# Patient Record
Sex: Female | Born: 1950
Health system: Southern US, Community
[De-identification: ages and names within clinical notes are randomized; demographics above are authoritative.]

## PROBLEM LIST (undated history)

## (undated) DIAGNOSIS — I639 Cerebral infarction, unspecified: Secondary | ICD-10-CM

## (undated) DIAGNOSIS — F419 Anxiety disorder, unspecified: Secondary | ICD-10-CM

## (undated) DIAGNOSIS — C50919 Malignant neoplasm of unspecified site of unspecified female breast: Secondary | ICD-10-CM

## (undated) DIAGNOSIS — Z973 Presence of spectacles and contact lenses: Secondary | ICD-10-CM

## (undated) DIAGNOSIS — I10 Essential (primary) hypertension: Secondary | ICD-10-CM

## (undated) DIAGNOSIS — T4145XA Adverse effect of unspecified anesthetic, initial encounter: Secondary | ICD-10-CM

## (undated) DIAGNOSIS — E785 Hyperlipidemia, unspecified: Secondary | ICD-10-CM

## (undated) DIAGNOSIS — M1990 Unspecified osteoarthritis, unspecified site: Secondary | ICD-10-CM

## (undated) DIAGNOSIS — C569 Malignant neoplasm of unspecified ovary: Secondary | ICD-10-CM

## (undated) DIAGNOSIS — Z923 Personal history of irradiation: Secondary | ICD-10-CM

## (undated) DIAGNOSIS — Z9221 Personal history of antineoplastic chemotherapy: Secondary | ICD-10-CM

## (undated) DIAGNOSIS — T8859XA Other complications of anesthesia, initial encounter: Secondary | ICD-10-CM

## (undated) DIAGNOSIS — I4891 Unspecified atrial fibrillation: Secondary | ICD-10-CM

## (undated) HISTORY — DX: Presence of spectacles and contact lenses: Z97.3

## (undated) HISTORY — PX: TUBAL LIGATION: SHX77

## (undated) HISTORY — DX: Hyperlipidemia, unspecified: E78.5

## (undated) HISTORY — DX: Anxiety disorder, unspecified: F41.9

## (undated) HISTORY — DX: Malignant neoplasm of unspecified site of unspecified female breast: C50.919

## (undated) HISTORY — PX: APPENDECTOMY: SHX54

---

## 2002-12-07 HISTORY — PX: COLONOSCOPY: SHX174

## 2006-12-07 DIAGNOSIS — C50919 Malignant neoplasm of unspecified site of unspecified female breast: Secondary | ICD-10-CM

## 2006-12-07 HISTORY — PX: BREAST LUMPECTOMY: SHX2

## 2006-12-07 HISTORY — DX: Malignant neoplasm of unspecified site of unspecified female breast: C50.919

## 2008-06-26 LAB — HM DEXA SCAN: HM Dexa Scan: NORMAL

## 2012-10-31 ENCOUNTER — Emergency Department (HOSPITAL_COMMUNITY)
Admission: EM | Admit: 2012-10-31 | Discharge: 2012-10-31 | Disposition: A | Discharge status: Home / Self Care | Payer: BC Managed Care – PPO | Source: Home / Self Care

## 2012-10-31 ENCOUNTER — Encounter (HOSPITAL_COMMUNITY): Payer: Self-pay

## 2012-10-31 DIAGNOSIS — IMO0002 Reserved for concepts with insufficient information to code with codable children: Secondary | ICD-10-CM

## 2012-10-31 DIAGNOSIS — S76919A Strain of unspecified muscles, fascia and tendons at thigh level, unspecified thigh, initial encounter: Secondary | ICD-10-CM

## 2012-10-31 MED ORDER — CYCLOBENZAPRINE HCL 5 MG PO TABS: 5.0000 mg | ORAL_TABLET | Freq: Three times a day (TID) | ORAL | Status: DC | PRN

## 2012-10-31 NOTE — ED Provider Notes (Signed)
History     CSN: [id number]  Arrival date & time 10/31/12  1736   None     Chief Complaint  Patient presents with  . Leg Pain    (Consider location/radiation/quality/duration/timing/severity/associated sxs/prior treatment) Patient is a 61 y.o. female presenting with leg pain. The history is provided by the patient.  Leg Pain  The incident occurred more than 1 week ago. The incident occurred at home. There was no injury mechanism. The quality of the pain is described as aching and throbbing. The pain is mild. The pain has been intermittent since onset. Pertinent negatives include no numbness, no inability to bear weight, no loss of motion, no muscle weakness, no loss of sensation and no tingling. She reports no foreign bodies present. The symptoms are aggravated by activity and palpation. She has tried nothing for the symptoms. The treatment provided mild relief.  Pt states she has been working out more doing squats and walking her dog up and down stairs to attempt to get in shape and lose weight.    History reviewed. No pertinent past medical history.  History reviewed. No pertinent past surgical history.  No family history on file.  History  Substance Use Topics  . Smoking status: Never Smoker   . Smokeless tobacco: Not on file  . Alcohol Use: Yes    OB History    Grav Para Term Preterm Abortions TAB SAB Ect Mult Living                  Review of Systems  Musculoskeletal: Positive for arthralgias.  Neurological: Negative for tingling and numbness.  All other systems reviewed and are negative.    Allergies  Review of patient's allergies indicates no known allergies.  Home Medications   Current Outpatient Rx  Name  Route  Sig  Dispense  Refill  . ATORVASTATIN CALCIUM 10 MG PO TABS   Oral   Take 10 mg by mouth daily.         Marland Kitchen DABIGATRAN ETEXILATE MESYLATE 150 MG PO CAPS   Oral   Take 150 mg by mouth every 12 (twelve) hours.         Marland Kitchen FOLIC ACID 1 MG PO  TABS   Oral   Take 1 mg by mouth daily.         Marland Kitchen METOPROLOL TARTRATE 25 MG PO TABS   Oral   Take 25 mg by mouth 2 (two) times daily.         Marland Kitchen OMEPRAZOLE 40 MG PO CPDR   Oral   Take 40 mg by mouth daily.         Marland Kitchen VERAPAMIL HCL ER (CO) 240 MG PO TB24   Oral   Take 240 mg by mouth at bedtime.         . CYCLOBENZAPRINE HCL 5 MG PO TABS   Oral   Take 1 tablet (5 mg total) by mouth 3 (three) times daily as needed for muscle spasms.   20 tablet   0     BP 144/65  Pulse 94  Temp 97.8 F (36.6 C) (Oral)  Resp 18  SpO2 98%  Physical Exam  Nursing note and vitals reviewed. Constitutional: She is oriented to person, place, and time. Vital signs are normal. She appears well-developed and well-nourished. She is active and cooperative.  HENT:  Head: Normocephalic.  Eyes: Conjunctivae normal are normal. Pupils are equal, round, and reactive to light. No scleral icterus.  Neck: Trachea normal. Neck  supple.  Cardiovascular: Normal rate, regular rhythm, normal heart sounds and intact distal pulses.   Pulmonary/Chest: Effort normal and breath sounds normal.  Musculoskeletal: Normal range of motion.       Right hip: Normal.       Right knee: Normal.       Right upper leg: Normal.       Increased pain to right anterolateral distal thigh with ROM  Neurological: She is alert and oriented to person, place, and time. No cranial nerve deficit or sensory deficit.  Skin: Skin is warm and dry.  Psychiatric: She has a normal mood and affect. Her speech is normal and behavior is normal. Judgment and thought content normal. Cognition and memory are normal.    ED Course  Procedures (including critical care time)  Labs Reviewed - No data to display No results found.   1. Muscle strain of thigh       MDM  Heat therapy, muscle relaxants, declined pain medication.  Instructed to follow up with PCP this week if symptoms are not improved with conservative measures.           Johnsie Kindred, NP 10/31/12 1916

## 2012-10-31 NOTE — ED Notes (Signed)
C/o right leg pain, hip/inner thigh area, states has hurt for approx 3 weeks

## 2012-11-01 NOTE — ED Provider Notes (Signed)
Medical screening examination/treatment/procedure(s) were performed by resident physician or non-physician practitioner and as supervising physician I was immediately available for consultation/collaboration.   KINDL,JAMES DOUGLAS MD.    James D Kindl, MD 11/01/12 1123 

## 2012-11-05 ENCOUNTER — Encounter (HOSPITAL_COMMUNITY): Payer: Self-pay | Admitting: *Deleted

## 2012-11-05 ENCOUNTER — Emergency Department (HOSPITAL_COMMUNITY)
Admission: EM | Admit: 2012-11-05 | Discharge: 2012-11-05 | Disposition: A | Discharge status: Home / Self Care | Payer: BC Managed Care – PPO | Attending: Emergency Medicine | Admitting: Emergency Medicine

## 2012-11-05 ENCOUNTER — Emergency Department (HOSPITAL_COMMUNITY): Payer: BC Managed Care – PPO

## 2012-11-05 DIAGNOSIS — M25559 Pain in unspecified hip: Secondary | ICD-10-CM | POA: Insufficient documentation

## 2012-11-05 DIAGNOSIS — I10 Essential (primary) hypertension: Secondary | ICD-10-CM | POA: Insufficient documentation

## 2012-11-05 DIAGNOSIS — R0602 Shortness of breath: Secondary | ICD-10-CM | POA: Insufficient documentation

## 2012-11-05 DIAGNOSIS — M129 Arthropathy, unspecified: Secondary | ICD-10-CM | POA: Insufficient documentation

## 2012-11-05 DIAGNOSIS — Z79899 Other long term (current) drug therapy: Secondary | ICD-10-CM | POA: Insufficient documentation

## 2012-11-05 DIAGNOSIS — F3289 Other specified depressive episodes: Secondary | ICD-10-CM | POA: Insufficient documentation

## 2012-11-05 DIAGNOSIS — Z8673 Personal history of transient ischemic attack (TIA), and cerebral infarction without residual deficits: Secondary | ICD-10-CM | POA: Insufficient documentation

## 2012-11-05 DIAGNOSIS — Z7901 Long term (current) use of anticoagulants: Secondary | ICD-10-CM | POA: Insufficient documentation

## 2012-11-05 DIAGNOSIS — I4891 Unspecified atrial fibrillation: Secondary | ICD-10-CM | POA: Insufficient documentation

## 2012-11-05 DIAGNOSIS — F329 Major depressive disorder, single episode, unspecified: Secondary | ICD-10-CM | POA: Insufficient documentation

## 2012-11-05 HISTORY — DX: Unspecified osteoarthritis, unspecified site: M19.90

## 2012-11-05 HISTORY — DX: Unspecified atrial fibrillation: I48.91

## 2012-11-05 HISTORY — DX: Cerebral infarction, unspecified: I63.9

## 2012-11-05 HISTORY — DX: Essential (primary) hypertension: I10

## 2012-11-05 MED ORDER — OXYCODONE-ACETAMINOPHEN 5-325 MG PO TABS: 1.0000 | ORAL_TABLET | Freq: Four times a day (QID) | ORAL | Status: DC | PRN

## 2012-11-05 MED ORDER — IBUPROFEN 600 MG PO TABS: 600.0000 mg | ORAL_TABLET | Freq: Four times a day (QID) | ORAL | Status: DC | PRN

## 2012-11-05 NOTE — ED Notes (Signed)
Pt escorted to xray by transport tech

## 2012-11-05 NOTE — ED Provider Notes (Signed)
History   This chart was scribed for Brenda Chick, MD scribed by Magnus Sinning. The patient was seen in room TR07C/TR07C at 12:13   CSN: [id number]  Arrival date & time 11/05/12  1133   None     Chief Complaint  Patient presents with  . Hip Pain    (Consider location/radiation/quality/duration/timing/severity/associated sxs/prior treatment) HPI Comments: Brenda Cunningham is a 61 y.o. female who presents to the Emergency Department complaining of gradually worsening moderate right hip pain, onset one month with associated right inguinal pain. She reports that she has been having the pain for one month and that she was recently seen 5 days ago at Urgent Care. She was told it was a strain and that she would need to follow up with her PCP in one week. The patient is visiting from [place], thus does not have a PCP in the area. She says she has difficulty ambulating and has been experiencing SOB due to over exertion with movement of her hip. She reports she is currently on a blood thinner, which has made her apprehensive to take ibuprofen or aspirin. The patient has a hx of a fall a couple years ago, but she denies any recent falls or injuries.  Patient is a 61 y.o. female presenting with hip pain. The history is provided by the patient. No language interpreter was used.  Hip Pain This is a recurrent problem. The current episode started more than 1 week ago. The problem occurs constantly. The problem has been gradually worsening. Associated symptoms include shortness of breath. The symptoms are aggravated by walking. She has tried nothing for the symptoms.     Past Medical History  Diagnosis Date  . Hypertension   . Depression   . Stroke   . Arthritis   . A-fib     History reviewed. No pertinent past surgical history.  History reviewed. No pertinent family history.  History  Substance Use Topics  . Smoking status: Never Smoker   . Smokeless tobacco: Not on file  . Alcohol Use: Yes    Review of Systems  Respiratory: Positive for shortness of breath.   All other systems reviewed and are negative.    Allergies  Review of patient's allergies indicates no known allergies.  Home Medications   Current Outpatient Rx  Name  Route  Sig  Dispense  Refill  . ATORVASTATIN CALCIUM 10 MG PO TABS   Oral   Take 10 mg by mouth daily.         Marland Kitchen DABIGATRAN ETEXILATE MESYLATE 150 MG PO CAPS   Oral   Take 150 mg by mouth every 12 (twelve) hours.         . DESVENLAFAXINE SUCCINATE ER 50 MG PO TB24   Oral   Take 50 mg by mouth daily.         Marland Kitchen FOLIC ACID 1 MG PO TABS   Oral   Take 1 mg by mouth daily.         Marland Kitchen METOPROLOL TARTRATE 25 MG PO TABS   Oral   Take 25 mg by mouth 2 (two) times daily.         . MULTI-VITAMIN/MINERALS PO TABS   Oral   Take 1 tablet by mouth daily.         Marland Kitchen OMEPRAZOLE 40 MG PO CPDR   Oral   Take 40 mg by mouth daily.         Marland Kitchen VERAPAMIL HCL ER (CO) 240 MG PO  TB24   Oral   Take 240 mg by mouth daily.          . CYCLOBENZAPRINE HCL 5 MG PO TABS   Oral   Take 1 tablet (5 mg total) by mouth 3 (three) times daily as needed for muscle spasms.   20 tablet   0   . IBUPROFEN 600 MG PO TABS   Oral   Take 1 tablet (600 mg total) by mouth every 6 (six) hours as needed for pain.   30 tablet   0   . OXYCODONE-ACETAMINOPHEN 5-325 MG PO TABS   Oral   Take 1-2 tablets by mouth every 6 (six) hours as needed for pain.   15 tablet   0     BP 113/75  Pulse 110  Temp 98.1 F (36.7 C) (Oral)  Resp 18  SpO2 96%  Physical Exam  Nursing note and vitals reviewed. Constitutional: She is oriented to person, place, and time. She appears well-developed and well-nourished. No distress.  HENT:  Head: Normocephalic and atraumatic.  Eyes: Conjunctivae normal and EOM are normal.  Neck: Neck supple. No tracheal deviation present.  Cardiovascular: Normal rate.   Pulmonary/Chest: Effort normal. No respiratory distress.  Abdominal:  She exhibits no distension.  Musculoskeletal: Normal range of motion. She exhibits tenderness.       Tenderness to palpation over lateral hip joint. Pain with external rotation of her hip and tenderness to palpation over her right lateral thigh with no mass  Neurological: She is alert and oriented to person, place, and time. No sensory deficit.  Skin: Skin is warm and dry.  Psychiatric: She has a normal mood and affect. Her behavior is normal.    ED Course  Procedures (including critical care time) DIAGNOSTIC STUDIES: Oxygen Saturation is 96% on room air, normal by my interpretation.    COORDINATION OF CARE: 12:13: Physical exam performed.  Labs Reviewed - No data to display Dg Hip Complete Right  11/05/2012  *RADIOLOGY REPORT*  Clinical Data: Right hip pain radiating to groin.  No known injury.  RIGHT HIP - COMPLETE 2+ VIEW  Comparison: None.  Findings: Right hip is located.  There are mild degenerative changes of both hips.   .  No prior imaging of the pelvis is available for comparison. No fracture is identified.  Sacroiliac joints normal.  Pelvic ring intact.  Coarse irregularly shaped calcifications in the lower pelvis measure 3.4 by 2.4 cm. IMPRESSION:  1.  Mild degenerative changes of both hips. 2.  No acute bony abnormality. 3.  2.4 x 3.4 cm pelvic calcification. This has appearances suggestive of calcified uterine fibroids. Calcification assisted with an adnexal mass or urinary bladder cannot be completely excluded, but felt to be less likely.   Original Report Authenticated By: Britta Mccreedy, M.D.      1. Hip pain       MDM  Pt presents with pain in right hip and thigh- pain worse with movement and palpation.  xrays show mild arthritis in both hips.  Xray images reviewed by me.  Also suspect some element of ileotibial tract inflammation or strain given distribution of pain.  Recommend anti-inflammatories, pt will need to f/u with her PMD and may need physical therapy.   Discharged with strict return precautions.  Pt agreeable with plan.   I personally performed the services described in this documentation, which was scribed in my presence. The recorded information has been reviewed and is accurate.  Brenda Chick, MD 11/05/12 1345

## 2012-11-05 NOTE — ED Notes (Signed)
Pt reports right hip pain x 1 month that has become progressively worse. Pt ambulatory at triage.

## 2012-11-05 NOTE — ED Notes (Signed)
Explained delay in testing to pt and warm blanket given; family at bedside

## 2013-04-10 ENCOUNTER — Encounter: Payer: Self-pay | Admitting: Gastroenterology

## 2013-05-02 ENCOUNTER — Ambulatory Visit: Payer: BC Managed Care – PPO | Admitting: Gastroenterology

## 2013-05-03 ENCOUNTER — Ambulatory Visit (INDEPENDENT_AMBULATORY_CARE_PROVIDER_SITE_OTHER): Payer: BC Managed Care – PPO | Admitting: Medical

## 2013-05-03 ENCOUNTER — Encounter: Payer: Self-pay | Admitting: Medical

## 2013-05-03 VITALS — BP 122/88 | HR 68 | Temp 97.7°F | Resp 16 | Wt 224.0 lb

## 2013-05-03 DIAGNOSIS — I4891 Unspecified atrial fibrillation: Secondary | ICD-10-CM

## 2013-05-03 DIAGNOSIS — H9312 Tinnitus, left ear: Secondary | ICD-10-CM

## 2013-05-03 DIAGNOSIS — F329 Major depressive disorder, single episode, unspecified: Secondary | ICD-10-CM

## 2013-05-03 DIAGNOSIS — H9319 Tinnitus, unspecified ear: Secondary | ICD-10-CM

## 2013-05-03 DIAGNOSIS — F32A Depression, unspecified: Secondary | ICD-10-CM

## 2013-05-03 DIAGNOSIS — M25551 Pain in right hip: Secondary | ICD-10-CM

## 2013-05-03 DIAGNOSIS — M25559 Pain in unspecified hip: Secondary | ICD-10-CM

## 2013-05-03 NOTE — Patient Instructions (Signed)
Continue your current medications.  Begin using your allergy pill daily for 1-2 weeks.  Consider Mucinex OTC as well.  Drink plenty of water daily.  If the ear symptoms persist, then we may need to consider speciality referral.  Eye doctors:  Ophthalmology Dr. Glenford Peers [address] [place], [place] [address] [phone]   [facility] Dr. Gelene Mink [address], [place] [place], [place] [address]  [phone] [email or web address]   Vincenza Hews, M.D. Susanne Greenhouse, O.D. [address] [place], [place] [address] Medical telephone: [phone] Optical telephone: [phone]   Dentist  Dr. Yancey Flemings, dentist [address] [place], [place] [address] [phone] [email or web address]

## 2013-05-03 NOTE — Progress Notes (Signed)
Subjective: Here as a new patient.  Moved down from [place] in October 2013.   She has been searching for a primary care doctor.  Since moving down here did establish with Dr. Filbert Berthold OB[facility]Gyn in [facility], and initially established with Jovita Kussmaul [facility].  Went to [facility] twice, had labs, but never got results, didn't feel like they were seeing eye to eye.    She reports left ear ringing, right ear feels blocked up.   No URI symptoms.  She occasionally uses zyrtec for congestion/allergies.  Hx/o Afib.  Hasn't established with cardiology here.   Was being managed by her PCM.  Compliant with her medications.  Needs eye doctor too.    Hx/o breast cancer, left breast, had 6 wk radiation therapy, 35mo of chemotherapy, s/p left breast lumpectomy.   Just had mammogram 08/2012, no changes.  Recently saw gynecology here in [facility].    She reports right hip pain the last few months.  No injury, trauma, fall.  Using nothing for symptoms.  Allergies  Allergen Reactions  . Codeine     Current Outpatient Prescriptions on File Prior to Visit  Medication Sig Dispense Refill  . atorvastatin (LIPITOR) 10 MG tablet Take 10 mg by mouth daily.      . dabigatran (PRADAXA) 150 MG CAPS Take 150 mg by mouth every 12 (twelve) hours.      Marland Kitchen desvenlafaxine (PRISTIQ) 50 MG 24 hr tablet Take 50 mg by mouth daily.      . metoprolol tartrate (LOPRESSOR) 25 MG tablet Take 25 mg by mouth 2 (two) times daily.      . Multiple Vitamins-Minerals (MULTIVITAMIN WITH MINERALS) tablet Take 1 tablet by mouth daily.      Marland Kitchen omeprazole (PRILOSEC) 40 MG capsule Take 40 mg by mouth daily.      . verapamil (COVERA HS) 240 MG (CO) 24 hr tablet Take 240 mg by mouth daily.       Marland Kitchen oxyCODONE-acetaminophen (PERCOCET/ROXICET) 5-325 MG per tablet Take 1-2 tablets by mouth every 6 (six) hours as needed for pain.  15 tablet  0   No current facility-administered medications on file prior to visit.    Past  Medical History  Diagnosis Date  . Hypertension   . Depression   . Arthritis   . A-fib   . Hyperlipidemia   . Stroke 2010  . Breast cancer 2008    s/p lumpectomy, chemo and radiation therapy  . Wears glasses     Past Surgical History  Procedure Laterality Date  . Breast lumpectomy  2008    left  . Appendectomy    . Tubal ligation      No family history on file.  History   Social History  . Marital Status: Single    Spouse Name: N/A    Number of Children: N/A  . Years of Education: N/A   Occupational History  . Not on file.   Social History Main Topics  . Smoking status: Never Smoker   . Smokeless tobacco: Not on file  . Alcohol Use: 1.2 oz/week    1 Glasses of wine, 1 Cans of beer per week  . Drug Use: No  . Sexually Active: Not on file   Other Topics Concern  . Not on file   Social History Narrative   Lives with her female friend, [facility]    Reviewed their medical, surgical, family, social, medication, and allergy history and updated chart as appropriate.  Objective: Filed Vitals:   05/03/13 0902  BP: 122/88  Pulse: 68  Temp: 97.7 F (36.5 C)  Resp: 16    General appearance: alert, no distress, WD/WN, AA female HEENT: normocephalic, sclerae anicteric, TMs pearly, nares with mild turbinate edema, no discharge or erythema, pharynx normal Oral cavity: MMM, several teeth with decay Neck: supple, no lymphadenopathy, no thyromegaly, no masses, no bruits Heart: occasional ectopic beat, otherwise RRR, normal S1, S2 Lungs: CTA bilaterally, no wheezes, rhonchi, or rales Pulses: 2+ symmetric, upper and lower extremities, normal cap refill MSK: mild right hip tenderness on palpation, normal ROM, no obvious deformity.   Rest of LE exam unremarkable Ext: no edema    Assessment: Encounter Diagnoses  Name Primary?  . Atrial fibrillation Yes  . Hip pain, acute, right   . Tinnitus, left   . Depression      Plan: Atrial fibrillation - compliant with  medication.   We will await prior records.  She signed for records including prior PCM in [place] as well as records of OV and labs from [facility] from recent visit there.  C/t Pradaxa and current medications  Hip pain - possible muscle strain.  advised heat, stretching, no heavy lifting or deep squatting/stooping.  Can use NSAID prn.  Recheck 2-3 wk.  Tinnitus - possible related to eustachian tube dysfunction .   Advised daily zyrtec QHS, hydrate well, consider nasal saline flush daily, and if not improving in 2 wk, consider ENT referral  Depression - for now c/t same medication.   At recheck and records review, we will move to wean her off medication as she seems ready to stop the medication, doing fine in general.  Gave contact info for her to f/u with eye doctor and dentist.  Follow-up 2wk pending records review

## 2013-05-19 ENCOUNTER — Encounter: Payer: Self-pay | Admitting: Medical

## 2013-08-21 ENCOUNTER — Telehealth: Payer: Self-pay | Admitting: Medical

## 2013-08-21 MED ORDER — DESVENLAFAXINE SUCCINATE ER 50 MG PO TB24: 50.0000 mg | ORAL_TABLET | Freq: Every day | ORAL | Status: DC

## 2013-08-21 NOTE — Telephone Encounter (Signed)
This is a shane pt. Pt needs refill on pristiq sent to [facility].

## 2013-08-21 NOTE — Telephone Encounter (Signed)
She will need a followup appointment with Vincenza Hews

## 2013-08-21 NOTE — Telephone Encounter (Signed)
Done

## 2013-08-21 NOTE — Telephone Encounter (Signed)
N right called the medicine and that she needs a followup appointment with Brenda Cunningham

## 2013-08-22 ENCOUNTER — Telehealth: Payer: Self-pay | Admitting: Family Medicine

## 2013-08-22 NOTE — Telephone Encounter (Signed)
lm

## 2013-08-28 ENCOUNTER — Ambulatory Visit (INDEPENDENT_AMBULATORY_CARE_PROVIDER_SITE_OTHER): Payer: BC Managed Care – PPO | Admitting: Medical

## 2013-08-28 ENCOUNTER — Encounter: Payer: Self-pay | Admitting: Medical

## 2013-08-28 VITALS — BP 120/70 | HR 80 | Temp 98.3°F | Resp 16 | Wt 227.0 lb

## 2013-08-28 DIAGNOSIS — Z853 Personal history of malignant neoplasm of breast: Secondary | ICD-10-CM

## 2013-08-28 DIAGNOSIS — Z23 Encounter for immunization: Secondary | ICD-10-CM

## 2013-08-28 DIAGNOSIS — I10 Essential (primary) hypertension: Secondary | ICD-10-CM

## 2013-08-28 DIAGNOSIS — K219 Gastro-esophageal reflux disease without esophagitis: Secondary | ICD-10-CM

## 2013-08-28 DIAGNOSIS — L659 Nonscarring hair loss, unspecified: Secondary | ICD-10-CM

## 2013-08-28 DIAGNOSIS — F411 Generalized anxiety disorder: Secondary | ICD-10-CM

## 2013-08-28 DIAGNOSIS — Z8673 Personal history of transient ischemic attack (TIA), and cerebral infarction without residual deficits: Secondary | ICD-10-CM

## 2013-08-28 DIAGNOSIS — M129 Arthropathy, unspecified: Secondary | ICD-10-CM

## 2013-08-28 DIAGNOSIS — M1990 Unspecified osteoarthritis, unspecified site: Secondary | ICD-10-CM

## 2013-08-28 DIAGNOSIS — I4891 Unspecified atrial fibrillation: Secondary | ICD-10-CM

## 2013-08-28 NOTE — Patient Instructions (Signed)
3 medications we discussed stopping today includes: Mobic/meloxicam for joint pains Omeprazole/Prilosec for GERD Pristiq for anxiety  Instructions: 1) for now, stop Mobic, and if you have joint pains, use OTC Tylenol or Tylenol Arthritis.  You can also consider taking OTC Fish Oil tablets for joints.  2) try stopping Omeprazole for 1 week.  If you do ok off the medication, then go another week.  If after 2 weeks you are not having acid reflux problems, then only use the Omeprazole as needed.  Also, avoid acidic and spicy foods.  3) Regarding Pristiq - if you want to come off this medication, start taking this every other day for 2 weeks.  After 2 weeks, if doing ok, then go to taking this every 3 days.   If this continues to go ok, then back off to taking this 2 times per week, then call back to let me know how this is going.  We will refer you for mammogram.  Work on getting eye doctor appointment.

## 2013-08-28 NOTE — Progress Notes (Signed)
Subjective: Here for recheck.   I saw her as a new patient in May 2014.    In general feels good, doing fine.  Moved down from [place] in October 2013.   Since moving down here did establish with Dr. Filbert [facility] OB[facility]yn in [facility], and initially established with Jovita Kussmaul [facility].  Went to [facility] twice, had labs, but never got results, didn't feel like they were seeing eye to eye.    Hx/o Afib, but unsure of date of diagnosis or other treatments that were tried.  She notes only ever seeing cardiology with hospitalization 2010 for mild stroke.  She is compliant with her medications.  Her prior PCM was managing her Afib.   She notes back in 2010 having coworker say she had speech impairment. She went to the ED where she was told she had a mini stroke.   Had reportedly no infarct on head scan, but was hospitalized and began treatment that she remains on today.  No residual weakness, numbness, or other neurological change since.  Hx/o breast cancer, left breast, had 6 wk radiation therapy, 20mo of chemotherapy, s/p left breast lumpectomy.  Last mammogram 08/2012.  GERD - hx/o H pylori treated, but has continued on Prilosec.  She does tend to eat spicy foods.  Arthritis - hx/o hip pains, other joint pains, uses Mobic.    Allergies  Allergen Reactions  . Codeine     Current Outpatient Prescriptions on File Prior to Visit  Medication Sig Dispense Refill  . dabigatran (PRADAXA) 150 MG CAPS Take 150 mg by mouth every 12 (twelve) hours.      Marland Kitchen desvenlafaxine (PRISTIQ) 50 MG 24 hr tablet Take 1 tablet (50 mg total) by mouth daily.  30 tablet  0  . metoprolol tartrate (LOPRESSOR) 25 MG tablet Take 25 mg by mouth 2 (two) times daily.      . Multiple Vitamins-Minerals (MULTIVITAMIN WITH MINERALS) tablet Take 1 tablet by mouth daily.      . verapamil (COVERA HS) 240 MG (CO) 24 hr tablet Take 240 mg by mouth daily.        No current facility-administered medications on file  prior to visit.    Past Medical History  Diagnosis Date  . Hypertension   . Depression   . Arthritis   . A-fib   . Hyperlipidemia   . Stroke 2010  . Breast cancer 2008    s/p lumpectomy, chemo and radiation therapy  . Wears glasses     Past Surgical History  Procedure Laterality Date  . Breast lumpectomy  2008    left  . Appendectomy    . Tubal ligation    . Colonoscopy  2004    History reviewed. No pertinent family history.  History   Social History  . Marital Status: Single    Spouse Name: N/A    Number of Children: N/A  . Years of Education: N/A   Occupational History  . Not on file.   Social History Main Topics  . Smoking status: Never Smoker   . Smokeless tobacco: Not on file  . Alcohol Use: 1.2 oz/week    1 Glasses of wine, 1 Cans of beer per week  . Drug Use: No  . Sexual Activity: Not on file   Other Topics Concern  . Not on file   Social History Narrative   Lives with her female friend, [name], walks for exercise.  Originally from [place].  Reviewed their medical, surgical, family, social, medication, and allergy history and updated chart as appropriate.  Objective: Filed Vitals:   08/28/13 1053  BP: 120/70  Pulse: 80  Temp: 98.3 F (36.8 C)  Resp: 16    General appearance: alert, no distress, WD/WN, AA female Scalp - wears wig, diffuse hair loss since chemotherapy for breast cancer in the past Neck: supple, no lymphadenopathy, no thyromegaly, no masses, no bruits Heart: occasional ectopic beat, otherwise RRR, normal S1, S2 Lungs: CTA bilaterally, no wheezes, rhonchi, or rales Pulses: 2+ symmetric, upper and lower extremities, normal cap refill Ext: no edema   Adult ECG Report  Indication: hx/o afib, HTN  Rate: 78bpm  Rhythm: atrial fibrillation  QRS Axis: 31 degrees  PR Interval: unable to calculate  QRS Duration: 72ms  QTc:  Conduction Disturbances: competing junctional pacemaker?  Other Abnormalities: none   Patient's cardiac risk factors are: hypertension.  EKG comparison: none  Narrative Interpretation: afib    Assessment: Encounter Diagnoses  Name Primary?  Marland Kitchen A-fib Yes  . History of stroke   . Essential hypertension, benign   . Hair loss   . Anxiety state, unspecified   . GERD (gastroesophageal reflux disease)   . Arthritis   . Need for prophylactic vaccination and inoculation against influenza   . History of breast cancer     Plan: Atrial fibrillation but not sure about diagnosis or treatments tried other than medication, hx/o TIA/mild stroke 2010 - compliant with medication.  Will request records again as they didn't come in.  She signed for records including prior PCM in [place].  C/t Pradaxa and current medications HTN - controlled, compliant with medication Hair loss - referral to dermatology.  She has failed OTC topical womens Rogaine. Anxiety - she wants to come off Pristiq.  discussed strategy to wean off slowly.  Printed the instructions for her and she voices understanding. GERD - advised she avoid food triggers, but will try coming off Prilosec. Arthritis - given potential interaction with Pradaxa, advised she stop Mobic, and use OTC Tylenol and fish oil for joint pains. Counseled on the influenza virus vaccine.  Vaccine information sheet given.  Influenza vaccine given after consent obtained. Hx/o breast cancer - will refer for mammogram screening.

## 2013-08-29 ENCOUNTER — Other Ambulatory Visit: Payer: Self-pay | Admitting: Medical

## 2013-08-29 ENCOUNTER — Telehealth: Payer: Self-pay | Admitting: Family Medicine

## 2013-08-29 DIAGNOSIS — Z1231 Encounter for screening mammogram for malignant neoplasm of breast: Secondary | ICD-10-CM

## 2013-08-29 NOTE — Telephone Encounter (Signed)
Message copied by Kholton Coate L on Tue Aug 29, 2013  2:20 PM ------      Message from: Jac Canavan      Created: Mon Aug 28, 2013 12:03 PM       Refer for mammogram, screening.  She does have hx/o breast cancer.            Refer to dermatology for hair loss. ------

## 2013-08-29 NOTE — Telephone Encounter (Signed)
Patient is aware of her appointment to be seen at [facility] dermatology on 10/03/13 @ 1050 am. [phone]. CLS She is also aware of her appointment to be seen at the breast center on 09/07/13 @ 330 pm. [phone]. CLS

## 2013-09-07 ENCOUNTER — Ambulatory Visit: Payer: BC Managed Care – PPO

## 2013-10-03 ENCOUNTER — Ambulatory Visit
Admission: RE | Admit: 2013-10-03 | Discharge: 2013-10-03 | Disposition: A | Discharge status: Home / Self Care | Payer: BC Managed Care – PPO | Source: Ambulatory Visit | Attending: Medical | Admitting: Medical

## 2013-10-03 DIAGNOSIS — Z1231 Encounter for screening mammogram for malignant neoplasm of breast: Secondary | ICD-10-CM

## 2013-10-23 ENCOUNTER — Encounter: Payer: Self-pay | Admitting: Family Medicine

## 2013-11-07 ENCOUNTER — Telehealth: Payer: Self-pay | Admitting: Medical

## 2013-11-07 ENCOUNTER — Other Ambulatory Visit: Payer: Self-pay | Admitting: Medical

## 2013-11-07 MED ORDER — DABIGATRAN ETEXILATE MESYLATE 150 MG PO CAPS: 150.0000 mg | ORAL_CAPSULE | Freq: Two times a day (BID) | ORAL | Status: DC

## 2013-11-07 NOTE — Telephone Encounter (Signed)
refill sent, lets see her back in recheck in January

## 2013-11-07 NOTE — Telephone Encounter (Signed)
Pt needs refill on pradaxa. You have never filled this for her but she is completely out. She didn't have any for today. Pt takes it twice a day. [facility] uses [facility].

## 2013-11-12 ENCOUNTER — Encounter (HOSPITAL_COMMUNITY): Payer: Self-pay | Admitting: Emergency Medicine

## 2013-11-12 ENCOUNTER — Emergency Department (HOSPITAL_COMMUNITY)
Admission: EM | Admit: 2013-11-12 | Discharge: 2013-11-12 | Disposition: A | Discharge status: Home / Self Care | Payer: BC Managed Care – PPO | Attending: Emergency Medicine | Admitting: Emergency Medicine

## 2013-11-12 DIAGNOSIS — Z8739 Personal history of other diseases of the musculoskeletal system and connective tissue: Secondary | ICD-10-CM | POA: Insufficient documentation

## 2013-11-12 DIAGNOSIS — Z7902 Long term (current) use of antithrombotics/antiplatelets: Secondary | ICD-10-CM | POA: Insufficient documentation

## 2013-11-12 DIAGNOSIS — Z8673 Personal history of transient ischemic attack (TIA), and cerebral infarction without residual deficits: Secondary | ICD-10-CM | POA: Insufficient documentation

## 2013-11-12 DIAGNOSIS — I10 Essential (primary) hypertension: Secondary | ICD-10-CM

## 2013-11-12 DIAGNOSIS — I4891 Unspecified atrial fibrillation: Secondary | ICD-10-CM | POA: Insufficient documentation

## 2013-11-12 DIAGNOSIS — F411 Generalized anxiety disorder: Secondary | ICD-10-CM | POA: Insufficient documentation

## 2013-11-12 DIAGNOSIS — Z79899 Other long term (current) drug therapy: Secondary | ICD-10-CM | POA: Insufficient documentation

## 2013-11-12 DIAGNOSIS — E785 Hyperlipidemia, unspecified: Secondary | ICD-10-CM | POA: Insufficient documentation

## 2013-11-12 DIAGNOSIS — Z853 Personal history of malignant neoplasm of breast: Secondary | ICD-10-CM | POA: Insufficient documentation

## 2013-11-12 MED ORDER — METOPROLOL TARTRATE 25 MG PO TABS
25.0000 mg | ORAL_TABLET | Freq: Once | ORAL | Status: AC
  Administered 2013-11-12: 25 mg via ORAL
  Filled 2013-11-12: qty 1

## 2013-11-12 NOTE — ED Notes (Signed)
The pts bp has been high all day and she has not been  Able to sleep.  She takes bp med

## 2013-11-12 NOTE — ED Provider Notes (Signed)
CSN: [id number]     Arrival date & time 11/12/13  7829 History   First MD Initiated Contact with Patient 11/12/13 [phone]     Chief Complaint  Patient presents with  . Hypertension   (Consider location/radiation/quality/duration/timing/severity/associated sxs/prior Treatment) HPI Patient with history of atrial fibrillation and hypertension presents with concern that she's had elevated blood pressure for the past day. She's been told by multiple people that her blood pressure readings are incorrect. She states she's been getting readings of 170s/120s. She's been asymptomatic with this. She took her verapamil around 2:30 this morning. She denies having any headache, blurred vision, focal weakness or numbness. She denies any chest pain or shortness of breath. Past Medical History  Diagnosis Date  . Hypertension   . Arthritis   . A-fib   . Hyperlipidemia   . Stroke 2010  . Breast cancer 2008    s/p lumpectomy, chemo and radiation therapy  . Wears glasses   . Anxiety    Past Surgical History  Procedure Laterality Date  . Breast lumpectomy  2008    left  . Appendectomy    . Tubal ligation    . Colonoscopy  2004   No family history on file. History  Substance Use Topics  . Smoking status: Never Smoker   . Smokeless tobacco: Not on file  . Alcohol Use: 1.2 oz/week    1 Glasses of wine, 1 Cans of beer per week   OB History   Grav Para Term Preterm Abortions TAB SAB Ect Mult Living                 Review of Systems  Constitutional: Negative for fever and chills.  Respiratory: Negative for shortness of breath.   Cardiovascular: Negative for chest pain.  Gastrointestinal: Negative for nausea, vomiting and abdominal pain.  Musculoskeletal: Negative for back pain, neck pain and neck stiffness.  Skin: Negative for rash and wound.  Neurological: Negative for dizziness, syncope, weakness, light-headedness, numbness and headaches.  All other systems reviewed and are  negative.    Allergies  Codeine  Home Medications   Current Outpatient Rx  Name  Route  Sig  Dispense  Refill  . atorvastatin (LIPITOR) 10 MG tablet   Oral   Take 10 mg by mouth daily.         . dabigatran (PRADAXA) 150 MG CAPS capsule   Oral   Take 1 capsule (150 mg total) by mouth every 12 (twelve) hours.   60 capsule   3   . desvenlafaxine (PRISTIQ) 50 MG 24 hr tablet   Oral   Take 1 tablet (50 mg total) by mouth daily.   30 tablet   0   . metoprolol tartrate (LOPRESSOR) 25 MG tablet   Oral   Take 25 mg by mouth 2 (two) times daily.         . Multiple Vitamins-Minerals (MULTIVITAMIN WITH MINERALS) tablet   Oral   Take 1 tablet by mouth daily.         . verapamil (COVERA HS) 240 MG (CO) 24 hr tablet   Oral   Take 240 mg by mouth daily.           BP 143/91  Pulse 103  Temp(Src) 98.2 F (36.8 C) (Oral)  Resp 21  Ht 5\' 9"  (1.753 m)  Wt 227 lb (102.967 kg)  BMI 33.51 kg/m2  SpO2 100% Physical Exam  Nursing note and vitals reviewed. Constitutional: She is oriented to person, place,  and time. She appears well-developed and well-nourished. No distress.  HENT:  Head: Normocephalic and atraumatic.  Mouth/Throat: Oropharynx is clear and moist.  Eyes: EOM are normal. Pupils are equal, round, and reactive to light.  Neck: Normal range of motion. Neck supple.  Cardiovascular: Normal rate.   Irregular irregular  Pulmonary/Chest: Effort normal and breath sounds normal. No respiratory distress. She has no wheezes. She has no rales.  Abdominal: Soft. Bowel sounds are normal. She exhibits no distension and no mass. There is no tenderness. There is no rebound and no guarding.  Musculoskeletal: Normal range of motion. She exhibits no edema and no tenderness.  No calf swelling or tenderness.  Neurological: She is alert and oriented to person, place, and time.  Skin: Skin is warm and dry. No rash noted. No erythema.  Psychiatric: She has a normal mood and affect.  Her behavior is normal.    ED Course  Procedures (including critical care time) Labs Review Labs Reviewed - No data to display Imaging Review No results found.  EKG Interpretation   None       MDM  Patient is asymptomatic in emergency department. Her blood pressures improved and her heart rate is remained within normal limits. She is advised to follow with her primary Dr. Return precautions have been given.   Loren Racer, MD 11/13/13 [phone]

## 2013-11-12 NOTE — ED Notes (Signed)
Pt resting on bed talking in clear complete sentences.  NAD noted at current.  Pt states she was having BP at home of 169/102.  Pt states no complaints from the BP at this time, just concerned with how high it is at this time.

## 2013-11-17 ENCOUNTER — Ambulatory Visit (INDEPENDENT_AMBULATORY_CARE_PROVIDER_SITE_OTHER): Payer: BC Managed Care – PPO | Admitting: Medical

## 2013-11-17 ENCOUNTER — Encounter: Payer: Self-pay | Admitting: Medical

## 2013-11-17 VITALS — BP 122/80 | HR 68 | Temp 98.2°F | Resp 16 | Wt 220.5 lb

## 2013-11-17 DIAGNOSIS — I4891 Unspecified atrial fibrillation: Secondary | ICD-10-CM

## 2013-11-17 DIAGNOSIS — F411 Generalized anxiety disorder: Secondary | ICD-10-CM

## 2013-11-17 DIAGNOSIS — I10 Essential (primary) hypertension: Secondary | ICD-10-CM

## 2013-11-17 DIAGNOSIS — E785 Hyperlipidemia, unspecified: Secondary | ICD-10-CM

## 2013-11-17 MED ORDER — DESVENLAFAXINE SUCCINATE ER 50 MG PO TB24: 50.0000 mg | ORAL_TABLET | Freq: Every day | ORAL | Status: DC

## 2013-11-17 MED ORDER — METOPROLOL TARTRATE 25 MG PO TABS: 25.0000 mg | ORAL_TABLET | Freq: Two times a day (BID) | ORAL | Status: DC

## 2013-11-17 MED ORDER — ALPRAZOLAM 0.25 MG PO TABS: 0.2500 mg | ORAL_TABLET | Freq: Two times a day (BID) | ORAL | Status: DC | PRN

## 2013-11-17 MED ORDER — VERAPAMIL HCL 240 MG (CO) PO TB24: 240.0000 mg | ORAL_TABLET | Freq: Every day | ORAL | Status: DC

## 2013-11-17 NOTE — Progress Notes (Addendum)
Subjective: Here for hospital f/u.   Went to the ED this past Sunday at 3am.   Checked pressure and it was very high.  She was scared and went to the ED.  Thinks her machine may have been malfunctioning.   ED monitored her for a while.  checked her EKG and pressure, was given Metoprolol in the hospital.  released her later than morning.  No current chest pain, SOB, edema, palpitations.  They told her to come in and see [place] for her nerves.  Feels stressed, having problems sleeping.  "nerves are shot"  Gets crying spells just driving to the store.   In the past has used xanax prn.   She doesn't do good with bad news.  Is avoiding family at the holidays, worried about being around the grand kids given her nerves.  She did restart her Pristiq this week.     Past Medical History  Diagnosis Date  . Hypertension   . Arthritis   . A-fib   . Hyperlipidemia   . Stroke 2010  . Breast cancer 2008    s/p lumpectomy, chemo and radiation therapy  . Wears glasses   . Anxiety    ROS as in subjective.  Objective: Filed Vitals:   11/17/13 1135  BP: 122/80  Pulse: 68  Temp: 98.2 F (36.8 C)  Resp: 16    General appearance: alert, no distress, WD/WN, AA female, pleasant Neck: supple, no lymphadenopathy, no thyromegaly, no masses, no JVD or bruits Heart: irregular, normal S1, S2, no murmurs Lungs: CTA bilaterally, no wheezes, rhonchi, or rales Abdomen: +bs, soft, non tender, non distended, no masses, no hepatomegaly, no splenomegaly Pulses: 2+ symmetric, upper and lower extremities, normal cap refill Ext: no edema   Assessment: Encounter Diagnoses  Name Primary?  . Essential hypertension, benign Yes  . A-fib   . Hyperlipidemia      Plan: HTN - c/t current medication.  Stop using her home BP cuff as it sounds inaccurate.    Afib - c/t BP medication, pradaxa, healthy diet.  Advised cardiology referral to establish here in [place] but she declines to the first of the year as she has no  money for copay.  CHADS2 score of 3, high risk. hyperlipidemia - recheck next week for fasting labs.   Anxiety - c/t on Pristiq which she restarted this week, gave short term Xanax for prn use which she has used prior.   Follow-up 2wk.

## 2013-11-20 ENCOUNTER — Other Ambulatory Visit: Payer: No Typology Code available for payment source

## 2013-11-20 DIAGNOSIS — I4891 Unspecified atrial fibrillation: Secondary | ICD-10-CM

## 2013-11-20 DIAGNOSIS — I10 Essential (primary) hypertension: Secondary | ICD-10-CM

## 2013-11-20 DIAGNOSIS — E785 Hyperlipidemia, unspecified: Secondary | ICD-10-CM

## 2013-11-20 LAB — LIPID PANEL
Cholesterol: 152 mg/dL (ref 0–200)
HDL: 61 mg/dL (ref >39)
LDL Cholesterol: 79 mg/dL (ref 0–99)
Total CHOL/HDL Ratio: 2.5 Ratio
Triglycerides: 58 mg/dL (ref <150)
VLDL: 12 mg/dL (ref 0–40)

## 2013-11-20 LAB — COMPREHENSIVE METABOLIC PANEL
ALT: 17 U/L (ref 0–35)
AST: 15 U/L (ref 0–37)
Albumin: 4.1 g/dL (ref 3.5–5.2)
Alkaline Phosphatase: 93 U/L (ref 39–117)
BUN: 10 mg/dL (ref 6–23)
CO2: 23 mEq/L (ref 19–32)
Calcium: 9.8 mg/dL (ref 8.4–10.5)
Chloride: 106 mEq/L (ref 96–112)
Creat: 0.94 mg/dL (ref 0.50–1.10)
Glucose, Bld: 94 mg/dL (ref 70–99)
Potassium: 3.9 mEq/L (ref 3.5–5.3)
Sodium: 139 mEq/L (ref 135–145)
Total Bilirubin: 0.9 mg/dL (ref 0.3–1.2)
Total Protein: 7.6 g/dL (ref 6.0–8.3)

## 2013-11-20 LAB — CBC
HCT: 43.2 % (ref 36.0–46.0)
Hemoglobin: 14.2 g/dL (ref 12.0–15.0)
MCH: 33.1 pg (ref 26.0–34.0)
MCHC: 32.9 g/dL (ref 30.0–36.0)
MCV: 100.7 fL — ABNORMAL HIGH (ref 78.0–100.0)
Platelets: 286 10*3/uL (ref 150–400)
RBC: 4.29 MIL/uL (ref 3.87–5.11)
RDW: 15 % (ref 11.5–15.5)
WBC: 5.9 10*3/uL (ref 4.0–10.5)

## 2013-11-20 LAB — TSH: TSH: 2.238 u[IU]/mL (ref 0.350–4.500)

## 2013-11-21 ENCOUNTER — Telehealth: Payer: Self-pay | Admitting: Medical

## 2013-11-21 NOTE — Telephone Encounter (Signed)
lm

## 2013-11-27 ENCOUNTER — Telehealth: Payer: Self-pay | Admitting: Medical

## 2013-11-27 NOTE — Telephone Encounter (Signed)
pls call pharmacy.  I refilled whatever you had in computer.  Thus, either this was entered wrong in medications by [place], or there may have been an issue where Epic uploaded wrong one, or pharmacy filled the wrong one.  Please look into this.

## 2013-11-28 ENCOUNTER — Telehealth: Payer: Self-pay | Admitting: Family Medicine

## 2013-11-28 MED ORDER — METOPROLOL SUCCINATE ER 25 MG PO TB24: 25.0000 mg | ORAL_TABLET | Freq: Two times a day (BID) | ORAL | Status: DC

## 2013-11-28 NOTE — Telephone Encounter (Signed)
I spoke with the patient. The medication was ordered right in the system and resent to the pharmacy. I verified everything with her and I spoke with the pharmacists.CLS

## 2013-11-28 NOTE — Telephone Encounter (Signed)
done

## 2013-12-18 ENCOUNTER — Ambulatory Visit: Payer: BC Managed Care – PPO | Admitting: Medical

## 2014-01-01 ENCOUNTER — Encounter: Payer: Self-pay | Admitting: Medical

## 2014-01-30 ENCOUNTER — Telehealth: Payer: Self-pay | Admitting: Family Medicine

## 2014-01-30 NOTE — Telephone Encounter (Signed)
pls refer to cardiology for hx/o Afib

## 2014-01-30 NOTE — Telephone Encounter (Signed)
PT called [facility] [phone] to make an appt and they told her they need a referral from you.

## 2014-01-31 NOTE — Telephone Encounter (Signed)
I tried to call the patient no answer, no voicemail. CLS

## 2014-02-02 NOTE — Telephone Encounter (Signed)
Patient is aware of her appointment at [facility]. On 02/26/14 @ 200 pm. CLS

## 2014-03-10 ENCOUNTER — Other Ambulatory Visit: Payer: Self-pay | Admitting: Medical

## 2014-03-14 ENCOUNTER — Telehealth: Payer: Self-pay | Admitting: Medical

## 2014-03-21 NOTE — Telephone Encounter (Signed)
P.A. PRADAXA approved til 03/14/17, unable to reach pt by phone

## 2014-03-22 ENCOUNTER — Other Ambulatory Visit: Payer: Self-pay | Admitting: Medical

## 2014-03-22 NOTE — Telephone Encounter (Signed)
Is this okay to refill? 

## 2014-03-23 ENCOUNTER — Other Ambulatory Visit: Payer: Self-pay | Admitting: Medical

## 2014-03-30 ENCOUNTER — Telehealth: Payer: Self-pay | Admitting: Medical

## 2014-04-03 ENCOUNTER — Encounter: Payer: Self-pay | Admitting: Medical

## 2014-04-09 ENCOUNTER — Telehealth: Payer: Self-pay | Admitting: Family Medicine

## 2014-04-09 NOTE — Telephone Encounter (Signed)
Pt came in and cannot afford Pradaxa anymore her insurance changed how they pay.  This would be 327.95 out of pocket.  She has been without 24 hours and needs something different/cheaper called into [facility].

## 2014-04-10 NOTE — Telephone Encounter (Signed)
pls have her call pharmacy and find out what our options are instead of Pradaxa

## 2014-04-10 NOTE — Telephone Encounter (Signed)
I spoke with the pharmacy and they said the retail is 394.00 and her cost is 177.00. They said she would have to call her insurance company and see what would be cheaper. CLS

## 2014-04-10 NOTE — Telephone Encounter (Signed)
I have tried to call this patient twice but there is no answer. It just rings. CLS

## 2014-04-17 ENCOUNTER — Telehealth: Payer: Self-pay | Admitting: Medical

## 2014-04-17 NOTE — Telephone Encounter (Signed)
Patient states that she will contact her insurance company about other options instead of Pradaxa. CLS   I also spoke with her about a OV to follow up about Pristiq. She states that every since she received the letter stating they would not cover. She stop taking it and she feels fine and doesn't think she wants to take it anymore. CLS

## 2014-04-18 ENCOUNTER — Ambulatory Visit (INDEPENDENT_AMBULATORY_CARE_PROVIDER_SITE_OTHER): Payer: No Typology Code available for payment source | Admitting: Medical

## 2014-04-18 ENCOUNTER — Encounter: Payer: Self-pay | Admitting: Medical

## 2014-04-18 VITALS — BP 110/82 | HR 82 | Temp 98.2°F | Resp 16 | Wt 224.0 lb

## 2014-04-18 DIAGNOSIS — R05 Cough: Secondary | ICD-10-CM

## 2014-04-18 DIAGNOSIS — R059 Cough, unspecified: Secondary | ICD-10-CM

## 2014-04-18 DIAGNOSIS — J209 Acute bronchitis, unspecified: Secondary | ICD-10-CM

## 2014-04-18 DIAGNOSIS — I4891 Unspecified atrial fibrillation: Secondary | ICD-10-CM

## 2014-04-18 MED ORDER — AZITHROMYCIN 250 MG PO TABS: ORAL_TABLET | ORAL | Status: DC

## 2014-04-18 MED ORDER — HYDROCODONE-HOMATROPINE 5-1.5 MG/5ML PO SYRP: 5.0000 mL | ORAL_SOLUTION | Freq: Three times a day (TID) | ORAL | Status: DC | PRN

## 2014-04-18 NOTE — Progress Notes (Signed)
Subjective:  Brenda Cunningham is a 63 y.o. female who presents for cough.  Symptoms include 36mo of not feeling well, but bad cough x 2 weeks. [facility], advised she had viral URI.   She reports coughing, phlegm, some nausea and vomiting, sinus pressure, left ear pressure, some sneezing.  Denies sore throat, fever, headache, no itchy eyes.  Treatment to date: allegra.  No sick contacts.   She does not smoke.    Having problems getting Pradaxa covered.   No other aggravating or relieving factors.  No other c/o.  The following portions of the patient's history were reviewed and updated as appropriate: allergies, current medications, past family history, past medical history, past social history, past surgical history and problem list.  ROS as in subjective  Past Medical History  Diagnosis Date  . Hypertension   . Arthritis   . A-fib   . Hyperlipidemia   . Stroke 2010  . Breast cancer 2008    s/p lumpectomy, chemo and radiation therapy  . Wears glasses   . Anxiety      Objective: BP 110/82  Pulse 82  Temp(Src) 98.2 F (36.8 C) (Oral)  Resp 16  Wt 224 lb (101.606 kg)   General appearance: Alert, WD/WN, no distress                             Skin: warm, no rash, no diaphoresis                           Head: no sinus tenderness                            Eyes: conjunctiva normal, corneas clear, PERRLA                            Ears: pearly TMs, external ear canals normal                          Nose: septum midline, turbinates swollen, with erythema and clear discharge             Mouth/throat: MMM, tongue normal, mild pharyngeal erythema                           Neck: supple, no adenopathy, no thyromegaly, nontender                          Heart: Afib, otherwise no murmurs                         Lungs: +bronchial breath sounds, +scattered rhonchi, no wheezes, no rales                Extremities: no edema, nontender     Assessment: Encounter Diagnoses   Name Primary?  . Acute bronchitis Yes  . Cough   . A-fib       Plan:  Medication orders today include: Hycodan syrup, Zpak.  Discussed diagnosis and treatment of bronchitis.  Suggested symptomatic OTC remedies for cough and congestion.  Tylenol or Ibuprofen OTC for fever and malaise.  Call/return in 2-3 days if symptoms are worse or not improving.  afib - reviewed her recent cardiology notes with Dr. Woody Seller.   She has a new coupon card that she will try for copy on Pradaxa.  Advised she call us or cardiology if this doesn't work as she needs to be on anticoagulant.   She has [place] score of 4.

## 2014-04-25 ENCOUNTER — Other Ambulatory Visit: Payer: Self-pay | Admitting: Medical

## 2014-04-25 ENCOUNTER — Telehealth: Payer: Self-pay | Admitting: Medical

## 2014-04-25 MED ORDER — RIVAROXABAN 20 MG PO TABS: 20.0000 mg | ORAL_TABLET | Freq: Every day | ORAL | Status: DC

## 2014-04-25 NOTE — Telephone Encounter (Signed)
I sent Xarelto instead of Pradaxa for stroke prevention.  If there are no insurance issues, she will START this at the same time of day she has been taking Pradaxa, but STOP Pradaxa when she STARTS Xarelto.

## 2014-04-25 NOTE — Telephone Encounter (Signed)
I didn't realize she was completely out of Pristiq.  Have her come in to discuss mood and other options for medication regarding depression.  Regarding Pradaxa, call her cardiologist office today and see if they have a way to help with this insurance issue, or do they have any new news.  She has NOT been taking Pradaxa for a month!!!  I wasn't aware she was out completely.  We need to come up with a solution for anticoagulation!!!

## 2014-04-25 NOTE — Telephone Encounter (Signed)
Pt states Pradaxa still too expensive, on social security & can't afford, said you were going to call the heart doctor and find out if there was something else she could take instead.  Also has been off the Pristiq for a month because insurance wouldn't pay & wants to stay off of it if possible.  She is complaining of being extremely tired, weak, lifeless and crying.  Said not depressed but doesn't know what is causing this.  Has a few more samples left of Pradaxa.  Please advise her what to do.

## 2014-04-25 NOTE — Telephone Encounter (Signed)
I called [facility] / Dr. Einar Gip office and his nurse said that she would have to go to the doctor and prescribe the Paradaxa and they would have to call her insurance company. She states that Dr. Einar Gip did not prescribe her that medication. CLS Patient is not out of the Paradaxa. We gave her samples so she has samples. CLS

## 2014-04-26 NOTE — Telephone Encounter (Signed)
Spoke with patient and advised her on changing from Pradaxa to Xarelto.

## 2014-05-02 ENCOUNTER — Other Ambulatory Visit: Payer: Self-pay | Admitting: Medical

## 2014-05-02 ENCOUNTER — Telehealth: Payer: Self-pay | Admitting: Medical

## 2014-05-02 MED ORDER — RIVAROXABAN 20 MG PO TABS: 20.0000 mg | ORAL_TABLET | Freq: Every day | ORAL | Status: DC

## 2014-05-02 NOTE — Telephone Encounter (Signed)
I Need written Rx for Xarelto to include in fax for Patient Assistance

## 2014-05-02 NOTE — Telephone Encounter (Signed)
Pt called states needs to go back on Pristiq, nerves bothering her, crying.  She had wanted to go without being on it but realizes she can't.  P.A. For Pristiq was denied, pt needs trial of at least 2 more antidepressants, since she has tried Lexapro.  Pt's covered alternatives are Paxil, Prozac, Effexor, Wellbutrin or Amitriptyline.  Advised pt need appt to discuss med change.

## 2014-05-02 NOTE — Telephone Encounter (Signed)
done

## 2014-05-03 ENCOUNTER — Telehealth: Payer: Self-pay | Admitting: Medical

## 2014-05-03 ENCOUNTER — Ambulatory Visit (INDEPENDENT_AMBULATORY_CARE_PROVIDER_SITE_OTHER): Payer: No Typology Code available for payment source | Admitting: Medical

## 2014-05-03 ENCOUNTER — Encounter: Payer: Self-pay | Admitting: Medical

## 2014-05-03 VITALS — BP 114/74 | HR 76 | Temp 98.6°F | Resp 16 | Wt 223.0 lb

## 2014-05-03 DIAGNOSIS — F32A Depression, unspecified: Secondary | ICD-10-CM

## 2014-05-03 DIAGNOSIS — F329 Major depressive disorder, single episode, unspecified: Secondary | ICD-10-CM

## 2014-05-03 DIAGNOSIS — F3289 Other specified depressive episodes: Secondary | ICD-10-CM

## 2014-05-03 MED ORDER — PAROXETINE HCL 20 MG PO TABS: ORAL_TABLET | ORAL | Status: DC

## 2014-05-03 MED ORDER — FLUTICASONE PROPIONATE 50 MCG/ACT NA SUSP: 2.0000 | Freq: Every day | NASAL | Status: DC

## 2014-05-03 NOTE — Telephone Encounter (Signed)
lm

## 2014-05-03 NOTE — Progress Notes (Signed)
Subjective: Her for followup on depression. Has been on Pristiq x2 years has done really well at this. However her insurance refused to continue covering this medicine and she has been out for about 6 weeks now and is a mess.  Feels depressed, staying inside to herself, has frequent crying spells, has had some nausea, and the only thing she feels like doing is interacting with her 2 dogs.  Has had depression issues for years now. She has prior failed Lexapro Wellbutrin.  Insurer is requiring her to try either Paxil, Prozac, Effexor, Wellbutrin, or amitriptyline.. no SI, HI  Past Medical History  Diagnosis Date  . Hypertension   . Arthritis   . A-fib   . Hyperlipidemia   . Stroke 2010  . Breast cancer 2008    s/p lumpectomy, chemo and radiation therapy  . Wears glasses   . Anxiety    ROS as in subjective  Objective: Gen: wd, wn, nad Psych: pleasant ,answers questions appropriately  Assessment: Encounter Diagnosis  Name Primary?  . Depression Yes   Plan: She has already come off Pristiq cold [place].  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  Begin Paxil, one half tablet daily for 5 days then 1 tablet daily, discussed risk and benefits of medicine, recheck 2-3 wk.

## 2014-05-09 ENCOUNTER — Telehealth: Payer: Self-pay | Admitting: Medical

## 2014-05-09 NOTE — Telephone Encounter (Signed)
Pt states thinks Paxil making her sick, increased to whole tab and couldn't sleep and got sick.  She is going to try for a few more days and call on Friday with an update.

## 2014-05-09 NOTE — Telephone Encounter (Signed)
lm

## 2014-05-22 ENCOUNTER — Other Ambulatory Visit: Payer: Self-pay | Admitting: Medical

## 2014-06-12 NOTE — Telephone Encounter (Signed)
lm

## 2014-07-05 ENCOUNTER — Other Ambulatory Visit: Payer: Self-pay | Admitting: Medical

## 2014-07-18 ENCOUNTER — Encounter: Payer: Self-pay | Admitting: Medical

## 2014-07-18 ENCOUNTER — Ambulatory Visit (INDEPENDENT_AMBULATORY_CARE_PROVIDER_SITE_OTHER): Payer: Medicare Other | Admitting: Medical

## 2014-07-18 ENCOUNTER — Telehealth: Payer: Self-pay | Admitting: Medical

## 2014-07-18 ENCOUNTER — Telehealth: Payer: Self-pay | Admitting: Family Medicine

## 2014-07-18 VITALS — BP 120/80 | HR 66 | Temp 98.3°F | Resp 14 | Ht 69.0 in | Wt 213.0 lb

## 2014-07-18 DIAGNOSIS — Z7185 Encounter for immunization safety counseling: Secondary | ICD-10-CM

## 2014-07-18 DIAGNOSIS — Z0000 Encounter for general adult medical examination without abnormal findings: Secondary | ICD-10-CM

## 2014-07-18 DIAGNOSIS — Z7189 Other specified counseling: Secondary | ICD-10-CM

## 2014-07-18 DIAGNOSIS — I4891 Unspecified atrial fibrillation: Secondary | ICD-10-CM

## 2014-07-18 DIAGNOSIS — T148 Other injury of unspecified body region: Secondary | ICD-10-CM

## 2014-07-18 DIAGNOSIS — F411 Generalized anxiety disorder: Secondary | ICD-10-CM | POA: Insufficient documentation

## 2014-07-18 DIAGNOSIS — Z1211 Encounter for screening for malignant neoplasm of colon: Secondary | ICD-10-CM

## 2014-07-18 DIAGNOSIS — I10 Essential (primary) hypertension: Secondary | ICD-10-CM

## 2014-07-18 DIAGNOSIS — W57XXXA Bitten or stung by nonvenomous insect and other nonvenomous arthropods, initial encounter: Secondary | ICD-10-CM

## 2014-07-18 LAB — POCT URINALYSIS DIPSTICK
Bilirubin, UA: NEGATIVE
Glucose, UA: NEGATIVE
Ketones, UA: NEGATIVE
Nitrite, UA: NEGATIVE
Spec Grav, UA: 1.015
Urobilinogen, UA: NEGATIVE
pH, UA: 6

## 2014-07-18 MED ORDER — MUPIROCIN 2 % EX OINT: 1.0000 "application " | TOPICAL_OINTMENT | Freq: Three times a day (TID) | CUTANEOUS | Status: DC

## 2014-07-18 MED ORDER — FLUTICASONE PROPIONATE 50 MCG/ACT NA SUSP: 2.0000 | Freq: Every day | NASAL | Status: DC

## 2014-07-18 MED ORDER — OMEGA-3-ACID ETHYL ESTERS 1 G PO CAPS: 1.0000 g | ORAL_CAPSULE | Freq: Every day | ORAL | Status: DC

## 2014-07-18 MED ORDER — PAROXETINE HCL 20 MG PO TABS: 20.0000 mg | ORAL_TABLET | Freq: Every day | ORAL | Status: DC

## 2014-07-18 MED ORDER — ALPRAZOLAM 0.25 MG PO TABS
0.2500 mg | ORAL_TABLET | Freq: Two times a day (BID) | ORAL | Status: DC | PRN
Start: 2014-07-18 — End: 2015-11-28

## 2014-07-18 MED ORDER — RIVAROXABAN 20 MG PO TABS: 20.0000 mg | ORAL_TABLET | Freq: Every day | ORAL | Status: DC

## 2014-07-18 MED ORDER — VERAPAMIL HCL ER 240 MG PO TBCR
240.0000 mg | EXTENDED_RELEASE_TABLET | Freq: Every day | ORAL | Status: DC
Start: 2014-07-18 — End: 2015-07-25

## 2014-07-18 MED ORDER — METOPROLOL SUCCINATE ER 25 MG PO TB24: 50.0000 mg | ORAL_TABLET | Freq: Every day | ORAL | Status: DC

## 2014-07-18 MED ORDER — LISINOPRIL 2.5 MG PO TABS: 2.5000 mg | ORAL_TABLET | Freq: Every day | ORAL | Status: DC

## 2014-07-18 NOTE — Progress Notes (Signed)
Subjective:    Brenda Cunningham is a 63 y.o. female who presents for Medicare IPPE, "Welcome to Medicare" visit.   Names of Other Physician/Practitioners you currently use: 1. Liticia Gasior SHANE, PA-C here for primary care 2. eye doctor, last visit- looking for a eye doctor 3. dentist, last visit looking for a dentist 4. Dr. Einar Gip, cardiology   Medical Services you may have received from other than [facility] providers in the past year (date may be approximate) Dr. Einar Gip, cardiology  Preventative care: Last colonoscopy: 10 years ago Last mammogram: 10/03/13 Last pap smear/pelvic exam: 2014  Prior vaccinations: TD or Tdap:  unsure Influenza: 2014 Pneumococcal: n/a Shingles/Zostavax: n/a  History reviewed: allergies, current medications, past family history, past medical history, past social history, past surgical history and problem list  Current Problems (verified) Had a recent bug bite on her left butt cheek that is a bump, not going away.  Itches.  Using alcohol topically.   Immunization History  Administered Date(s) Administered  . Influenza,inj,Quad PF,36+ Mos 08/28/2013    Risk Factors: Tobacco History  Smoking status  . Never Smoker   Smokeless tobacco  . Not on file   female does smoke.  Patient is/is not a former smoker. Are there smokers in your home (other than you)?  No  Alcohol Current alcohol use: occassional beer and red wine  Caffeine Current caffeine use: sometimes a pepsi  Exercise Current exercise habits: walking  Current exercise: walking  Nutrition/Diet Current diet: well balanced  Cardiac risk factors: hypertension, afib.  Depression Screen Nurse depression screen reviewed.  (Note: if answer to either of the following is "Yes", a more complete depression screening is indicated)   Q1: Over the past two weeks, have you felt down, depressed or hopeless? No  Q2: Over the past two weeks, have you felt little interest or pleasure in doing  things? No  Have you lost interest or pleasure in daily life? No  Do you often feel hopeless? No  Do you cry easily over simple problems? No  Activities of Daily Living Nurse ADLs screen reviewed.  In your present state of health, do you have any difficulty performing the following activities?:  Driving? no Managing money?  no Feeding yourself? no Getting from bed to chair? no Climbing a flight of stairs? no Preparing food and eating?:no Bathing or showering? no Getting dressed: no Getting to the toilet? no Using the toilet:no Moving around from place to place: no In the past year have you fallen or had a near fall?:No   Are you sexually active?  No  Do you have more than one partner?  No  Vision Difficulties: No  Hearing Difficulties: Yes Do you often ask people to speak up or repeat themselves? Yes Do you experience ringing or noises in your ears? No Do you have difficulty understanding soft or whispered voices? No  Cognition  Do you feel that you have a problem with memory? No  Do you often misplace items? No  Do you feel safe at home?  Yes  Advanced directives Does patient have a [place]? No Does patient have a Living Will? No   Objective:     Vision and hearing screens reviewed.   Blood pressure 120/80, pulse 66, temperature 98.3 F (36.8 C), temperature source Oral, resp. rate 14, height 5\' 9"  (1.753 m), weight 213 lb (96.616 kg). Body mass index is 31.44 kg/(m^2).  Wt Readings from Last 3 Encounters:  07/18/14 213 lb (96.616  kg)  05/03/14 223 lb (101.152 kg)  04/18/14 224 lb (101.606 kg)    General appearance: alert, no distress, WD/WN, AA female Cognitive Testing  Alert? Yes  Normal Appearance?Yes  Oriented to person? Yes  Place? Yes   Time? Yes  Recall of three objects?  Yes  Can perform simple calculations? Yes  Displays appropriate judgment?Yes  Can read the correct time from a watch face?Yes HEENT: normocephalic,  sclerae anicteric, TMs pearly, nares patent, no discharge or erythema, pharynx normal Oral cavity: MMM, no lesions Neck: supple, no lymphadenopathy, no thyromegaly, no masses Heart: irregularly irregular, no murmurs Lungs: CTA bilaterally, no wheezes, rhonchi, or rales Abdomen: +bs, soft, vertical surgicsal scar below umbilicus, otherwise non tender, non distended, no masses, no hepatomegaly, no splenomegaly Musculoskeletal: nontender, no swelling, no obvious deformity Extremities: no edema, no cyanosis, no clubbing Pulses: 2+ symmetric, upper and lower extremities, normal cap refill Neurological: alert, oriented x 3, CN2-12 intact, strength normal upper extremities and lower extremities, sensation normal throughout, DTRs 2+ throughout, no cerebellar signs, gait normal Psychiatric: normal affect, behavior normal, pleasant  Skin: Right nasal flare with brown round 3 mm raised benign lesion unchanged for years per patient, similar raised brown 3 mm papule of the left chin unchanged for years per patient, left buttock with 4 mm raised papule with slightly depressed center, few scattered macules throughout, otherwise unremarkable Breast/gyn/rectal - deferred to gynecology   Assessment:   Encounter Diagnoses  Name Primary?  . Welcome to Medicare preventive visit Yes  . Essential hypertension, benign   . Atrial fibrillation, unspecified   . Insect bite   . Vaccine counseling   . Special screening for malignant neoplasms, colon   . Anxiety state, unspecified      Plan:   During the course of the visit the patient was educated and counseled about appropriate screening and preventive services including:    Pneumococcal vaccine   Influenza vaccine  Td vaccine  Screening mammography  Screening Pap smear and pelvic exam   Bone densitometry screening  Colorectal cancer screening  Glaucoma screening  Nutrition counseling   Advanced directives: Advise she get an attorney or other  state forms to complete health care power of attorney and living will  Screening recommendations, referrals: Influenza vaccine: Yearly in September Pneumococcal vaccine: Let us plan to give you the first Prevnar 13 in September when you come for flu shot Shingles vaccine: Check insurance coverage and co-pay for the shingles vaccine  Tdap vaccine: Check insurance coverage and co-pay for that Tdap vaccine Colonoscopy: We will refer you for repeat colonoscopy screening Mammogram: Her last mammogram was in November 2014, normal.  Plan to do mammogram yearly Pelvic exam/Pap smear: We will refer you to gynecology for routine care  Continue Aspirin 81mg  nightly for heart health I recommend daily exercise and stretching Eat a healthy diet low-fat diet  If you have not had a bone density scan, I recommend a baseline bone density scan  See Dr. Faythe Dingwall as planned soon  Make an appointment with an eye doctor for routine eye care Make appointment with dentist for routine dental care  Conditions/risks identified: Need for vaccines and screening  Medicare Attestation I have personally reviewed: The patient's medical and social history Their use of alcohol, tobacco or illicit drugs Their current medications and supplements The patient's functional ability including ADLs,fall risks, home safety risks, cognitive, and hearing and visual impairment Diet and physical activities Evidence for depression or mood disorders  The patient's weight, height,  BMI, and visual acuity have been recorded in the chart.  I have made referrals, counseling, and provided education to the patient based on review of the above and I have provided the patient with a written personalized care plan for preventive services.     Crisoforo Oxford, PA-C   07/18/2014

## 2014-07-18 NOTE — Telephone Encounter (Signed)
I left a message on the referral to [facility] for them to call back about her appointment. CLS

## 2014-07-18 NOTE — Telephone Encounter (Signed)
Message copied by Armanda Magic on Wed Jul 18, 2014  4:52 PM ------      Message from: Carlena Hurl      Created: Wed Jul 18, 2014 12:22 PM       Please call out her Xanax, see order in chart ------

## 2014-07-18 NOTE — Telephone Encounter (Signed)
I scheduled her appointment to see Dr. Collene Mares on 07/23/14 @ 215 pm. CLS  I fax over her OV notes to Dr. Einar Gip. CLS

## 2014-07-18 NOTE — Addendum Note (Signed)
Addended by: Carlena Hurl on: 07/18/2014 12:22 PM   Modules accepted: Orders

## 2014-07-18 NOTE — Telephone Encounter (Signed)
Refer to Earnstine Regal, PA for gynecology screening  Refer to Dr. Collene Mares for repeat colonoscopy  Send copy of my note today to Dr. Einar Gip

## 2014-07-18 NOTE — Telephone Encounter (Signed)
I called the patients Xanax out to her pharmacy per Chana Bode PAC. CLS

## 2014-07-18 NOTE — Patient Instructions (Signed)
MEDICARE PREVENTATIVE SERVICES (FEMALE) AND PERSONALIZED PLAN for Brenda Cunningham July 18, 2014  CONDITIONS OR RISKS IDENTIFIED TODAY: Encounter Diagnoses  Name Primary?  . Welcome to Medicare preventive visit Yes  . Essential hypertension, benign   . Atrial fibrillation, unspecified   . Insect bite   . Vaccine counseling   . Special screening for malignant neoplasms, colon   . Anxiety state, unspecified     SPECIFIC RECOMMENDATIONS:  Influenza vaccine: Yearly in September Pneumococcal vaccine: Let us plan to give you the first Prevnar 13 in September when you come for flu shot Shingles vaccine: Check insurance coverage and co-pay for the shingles vaccine  Tdap vaccine: Check insurance coverage and co-pay for that Tdap vaccine Colonoscopy: We will refer you for repeat colonoscopy screening Mammogram: Her last mammogram was in November 2014, normal.  Plan to do mammogram yearly Pelvic exam/Pap smear: We will refer you to gynecology for routine care  Continue Aspirin 81mg  nightly for heart health I recommend daily exercise and stretching Eat a healthy diet low-fat diet  If you have not had a bone density scan, I recommend a baseline bone density scan  See Dr. Faythe Dingwall as planned soon  Make an appointment with an eye doctor for routine eye care. Some suggestions are as follows Ophthalmology Dr. Webb Laws [address] [place], [place] [address] [phone]   [facility] Dr. Camillo Flaming [address], [place], [place] [address]  [email or web address]   Fabio Pierce, M.D. Corena Herter, O.D. [place], [place], [place] [address] Medical telephone: [phone] Optical telephone: [phone]  Make appointment for dentist for routine care.  Suggestions are as follows: Dr. Jonna Coup, dentist [address], [place], [place] [address] [phone] [email or web address]   Return yearly for  physical   GENERAL RECOMMENDATIONS FOR GOOD HEALTH:  Supplements:    Take a daily baby Aspirin 81mg  at bedtime for heart health unless you have a history of gastrointestinal bleed, allergy to aspirin, or are already taking higher dose Aspirin or other antiplatelet or blood thinner medication.     Consume 1200 mg of Calcium daily through dietary calcium or supplement if you are female age 65 or older, or men 21 and older.   Men aged 90-70 should consume 1000 mg of Calcium daily.   Take 600 IU of Vitamin D daily.  Take 800 IU of Calcium daily if you are older than age 65.    Take a general multivitamin daily.   Healthy diet: Eat a variety of foods, including fruits, vegetables, vegetable protein such as beans, lentils, tofu, and grains, such as rice.  Limit meat or animal protein, but if you eat meat, choose leans cuts such as chicken, fish, or [place].  Drink plenty of water daily.  Decrease saturated fat in the diet, avoid lots of red meat, processed foods, sweets, fast foods, and fried foods.  Limit salt and caffeine intake.  Exercise: Aerobic exercise helps maintain good heart health. Weight bearing exercise helps keep bones and muscles working strong.  We recommend at least 30-40 minutes of exercise most days of the week.   Fall prevention: Falls are the leading cause of injuries, accidents, and accidental deaths in people over the age of 65. Falling is a real threat to your ability to live on your own.  Causes include poor eyesight or poor hearing, illness, poor lighting, throw rugs, clutter in your home, and medication side effects causing dizziness or balance  problems.  Such medications can include medications for depression, sleep problems, high blood pressure, diabetes, and heart conditions.   PREVENTION  Be sure your home is as safe as possible. Here are some tips:  Wear shoes with non-skid soles (not house slippers).   Be sure your home and outside area are well lit.   Use night  lights throughout your house, including hallways and stairways.   Remove clutter and clean up spills on floors and walkways.   Remove throw rugs or fasten them to the floor with carpet tape. Tack down carpet edges.   Do not place electrical cords across pathways.   Install grab bars in your bathtub, shower, and toilet area. Towel bars should not be used as a grab bar.   Install handrails on both sides of stairways.   Do not climb on stools or stepladders. Get someone else to help with jobs that require climbing.   Do not wax your floors at all, or use a non-skid wax.   Repair uneven or unsafe sidewalks, walkways or stairs.   Keep frequently used items within reach.   Be aware of pets so you do not trip.  Get regular check-ups from your doctor, and take good care of yourself:  Have your eyes checked every year for vision changes, cataracts, glaucoma, and other eye problems. Wear eyeglasses as directed.   Have your hearing checked every 2 years, or anytime you or others think that you cannot hear well. Use hearing aids as directed.   See your caregiver if you have foot pain or corns. Sore feet can contribute to falls.   Let your caregiver know if a medicine is making you feel dizzy or making you lose your balance.   Use a cane, walker, or wheelchair as directed. Use walker or wheelchair brakes when getting in and out.   When you get up from bed, sit on the side of the bed for 1 to 2 minutes before you stand up. This will give your blood pressure time to adjust, and you will feel less dizzy.   If you need to go to the bathroom often, consider using a bedside commode.  Disease prevention:  If you smoke or chew tobacco, find out from your caregiver how to quit. It can literally save your life, no matter how long you have been a tobacco user. If you do not use tobacco, never begin. Medicare does cover some smoking cessation counseling.  Maintain a healthy diet and normal weight.  Increased weight leads to problems with blood pressure and diabetes. We check your height, weight, and BMI as part of your yearly visit.  The Body Mass Index or BMI is a way of measuring how much of your body is fat. Having a BMI above 27 increases the risk of heart disease, diabetes, hypertension, stroke and other problems related to obesity. Your caregiver can help determine your BMI and based on it develop an exercise and dietary program to help you achieve or maintain this important measurement at a healthful level.  High blood pressure causes heart and blood vessel problems.  Persistent high blood pressure should be treated with medicine if weight loss and exercise do not work.  We check your blood pressure as part of your yearly visit.  Avoid drinking alcohol in excess (more than two drinks per day).  Avoid use of street drugs. Do not share needles with anyone. Ask for professional help if you need assistance or instructions on stopping the use  of alcohol, cigarettes, and/or drugs.  Brush your teeth twice a day with fluoride toothpaste, and floss once a day. Good oral hygiene prevents tooth decay and gum disease. The problems can be painful, unattractive, and can cause other health problems. Visit your dentist for a routine oral and dental checkup and preventive care every 6-12 months.   See your eye doctor yearly for routine screening for things like glaucoma.  Look at your skin regularly.  Use a mirror to look at your back. Notify your caregivers of changes in moles, especially if there are changes in shapes, colors, a size larger than a pencil eraser, an irregular border, or development of new moles.  Safety:  Use seatbelts 100% of the time, whether driving or as a passenger.  Use safety devices such as hearing protection if you work in environments with loud noise or significant background noise.  Use safety glasses when doing any work that could send debris in to the eyes.  Use a helmet if  you ride a bike or motorcycle.  Use appropriate safety gear for contact sports.  Talk to your caregiver about gun safety.  Use sunscreen with a SPF (or skin protection factor) of 15 or greater.  Lighter skinned people are at a greater risk of skin cancer. Don't forget to also wear sunglasses in order to protect your eyes from too much damaging sunlight. Damaging sunlight can accelerate cataract formation.   If you have multiple sexual partners, or if you are not in a monogamous relationship, practice safe sex. Use condoms. Condoms are used to help reduce the spread of sexually transmitted infections (or STIs).  Consider an HIV test if you have never been tested.  Consider routine screening for STIs if you have multiple sexual partners.   Keep carbon monoxide and smoke detectors in your home functioning at all times. Change the batteries every 6 months or use a model that plugs into the wall or is hard wired in.   END OF LIFE PLANNING/ADVANCED DIRECTIVES Advance health-care planning is deciding the kind of care you want at the end of life. While alert competent adults are able to exercise their rights to make health care and financial decisions, problems arise when an individual becomes unconscious, incapacitated, or otherwise unable to communicate or make such decisions. Advance health care directives are the legal documents in which you give written instructions about your choices limited, aggressive or palliative care if, in the future, you cannot speak for yourself.  Advanced directives include the following: [place] allows you to appoint someone to act as your health care agent to make health care decisions for you should it be determined by your health care provider that you are no longer able to make these decisions for yourself.  A Living Will is a legal document in which you can declare that under certain conditions you desire your life not be prolonged by extraordinary or  artificial means during your last illness or when you are near death. We can provide you with sample advanced directives, you can get an attorney to prepare these for you, or you can visit [place] Secretary of State's website for additional information and resources at [email or web address]  Further, I recommend you have an attorney prepare a Will and Durable Power of Attorney if you haven't done so already.  Please get [place] a copy of your health care Advanced Directives.   PREVENTATIV E CARE RECOMMENDATIONS:  Vaccinations: We recommend the following vaccinations as  part of your preventative care:  Pneumococcal vaccine is recommended to protect against certain types of pneumonia.  This is normally recommended for adults age 20 or older once, or up to every 5 years for those at high risk.  The vaccine is also recommended for adults younger than 65 years old with certain underlying conditions that make them high risk for pneumonia.  Influenza vaccine is recommended to protect against seasonal influenza or "the flu." Influenza is a serious disease that can lead to hospitalization and sometimes even death. Traditional flu vaccines (called trivalent vaccines) are made to protect against three flu viruses; an influenza A (H1N1) virus, an influenza A (H3N2) virus, and an influenza B virus. In addition, there are flu vaccines made to protect against four flu viruses (called "quadrivalent" vaccines). These vaccines protect against the same viruses as the trivalent vaccine and an additional B virus.  We recommend the high dose influenza vaccine to those 65 years and older.  Hepatitis B vaccine to protect against a form of infection of the liver by a virus acquired from blood or body fluids, particularly for high risk groups.  Td or Tdap vaccine to protect against Tetanus, diphtheria and pertussis which can be very serious.  These diseases are caused by bacteria.  Diphtheria and pertussis are spread  from person to person through coughing or sneezing.  Tetanus enters the body through cuts, scratches, or wounds.  Tetanus (Lockjaw) causes painful muscle tightening and stiffness, usually all over the body.  Diphtheria can cause a thick coating to form in the back of the throat.  It can lead to breathing problems, paralysis, heart failure, and death.  Pertussis (Whooping Cough) causes severe coughing spells, which can cause difficulty breathing, vomiting and disturbed sleep.  Td or Tdap is usually given every 10 years.  Shingles vaccine to protect against Varicella Zoster if you are older than age 34, or younger than 66 years old with certain underlying illness.    Cancer Screening: Most routine colon cancer screening begins at the age of 57.  Subsequent colonoscopies are performed either every 5-10 years for normal screening, or every 2-5 years for higher risks patients, up until age 65 years of age. Annual screening is done with easy to use take-home tests to check for hidden blood in the stool called hemoccult tests.  Sigmoidoscopy or colonoscopy can detect the earliest forms of colon cancer and is life saving. These tests use a small camera at the end of a tube to directly examine the colon.   Pelvic Exam and Pap Smear: Pelvic exams and pap smears are performed routinely to evaluate for abnormalities as well as cancers including cervical and vaginal cancers.  This is generally performed every 2-3 years for most women, or more frequently for higher risk patients.  Mammograms: Mammograms are used to screen for breast cancer.  Medicare covers baseline screening once from ages 99-11 years old, but will cover mammograms yearly for those 40 years and older.  In accordance with other guidelines, you may not need a mammogram every year though.  The decision on how frequently you need a mammogram should be discussed with you medical provider.    Osteoporosis Screening: Screening for osteoporosis usually  begins at age 66 for women, and can be done as frequent as every 2 years.  However, women or men with higher risk of osteoporosis may be screened earlier than age 45.  Osteoporosis or low bone mass is diminished bone strength from alterations in bone  architecture leading to bone fragility and increased fracture risk.     Cardiovascular Screening: Fat and cholesterol leaves deposits in your arteries that can block them. This causes heart disease and vessel disease elsewhere in your body.  If your cholesterol is found to be high, or if you have heart disease or certain other medical conditions, then you may need to have your cholesterol monitored frequently and be treated with medication. Cardiovascular screening in the form of lab tests for cholesterol, HDL and triglycerides can be done every 5 years.  A screening electrocardiogram can be done as part of the Welcome to Medicare physical.  Diabetes Screening: Diabetes screening can be done at least every 3 years for those with risk factors,  or every 6-74months for prediabetic patients.  Screening includes fasting blood sugar test or glucose tolerance test.  Risk factors include hypertension, dyslipidemia, obesity, previously abnormal glucose tests, family history of diabetes, age 26 years or older, and history of gestations diabetes.   AAA (abdominal aortic aneurysm) Screening: Medicare allows for a one time ultrasound to screen for abdominal aortic aneurysm if done as a referral as part of the Welcome to Medicare exam.  Men eligible for this screening include those men between age 36-69 years of age who have smoked at least 100 cigarettes in his lifetime and/or has a family history of AAA.  HIV Screening:  Medicare allows for yearly screening for patients at high risk for contracting HIV disease.

## 2014-07-19 NOTE — Progress Notes (Signed)
Unable to contact patient via phone number provided.

## 2014-07-20 ENCOUNTER — Other Ambulatory Visit: Payer: Self-pay | Admitting: Family Medicine

## 2014-07-20 DIAGNOSIS — Z01419 Encounter for gynecological examination (general) (routine) without abnormal findings: Secondary | ICD-10-CM

## 2014-07-20 NOTE — Telephone Encounter (Signed)
Patient is aware of referral appointments. CLS

## 2014-07-25 DIAGNOSIS — I4891 Unspecified atrial fibrillation: Secondary | ICD-10-CM | POA: Diagnosis not present

## 2014-07-25 DIAGNOSIS — I10 Essential (primary) hypertension: Secondary | ICD-10-CM | POA: Diagnosis not present

## 2014-07-25 DIAGNOSIS — Z8673 Personal history of transient ischemic attack (TIA), and cerebral infarction without residual deficits: Secondary | ICD-10-CM | POA: Diagnosis not present

## 2014-07-26 ENCOUNTER — Telehealth: Payer: Self-pay | Admitting: Internal Medicine

## 2014-07-26 NOTE — Telephone Encounter (Signed)
Pt [facility]@ 2:40pm. Pt is to arrive at 2:10pm. Pt was notified but will call them as she would like a morning appt instead. I have faxed over notes to [facility] at [phone]

## 2014-08-01 DIAGNOSIS — K5900 Constipation, unspecified: Secondary | ICD-10-CM | POA: Diagnosis not present

## 2014-08-01 DIAGNOSIS — Z1211 Encounter for screening for malignant neoplasm of colon: Secondary | ICD-10-CM | POA: Diagnosis not present

## 2014-08-01 DIAGNOSIS — I2510 Atherosclerotic heart disease of native coronary artery without angina pectoris: Secondary | ICD-10-CM | POA: Diagnosis not present

## 2014-08-01 DIAGNOSIS — I4891 Unspecified atrial fibrillation: Secondary | ICD-10-CM | POA: Diagnosis not present

## 2014-08-03 ENCOUNTER — Other Ambulatory Visit (INDEPENDENT_AMBULATORY_CARE_PROVIDER_SITE_OTHER): Payer: Medicare Other

## 2014-08-03 DIAGNOSIS — R319 Hematuria, unspecified: Secondary | ICD-10-CM | POA: Diagnosis not present

## 2014-08-03 LAB — POCT URINALYSIS DIPSTICK
Bilirubin, UA: NEGATIVE
Glucose, UA: NEGATIVE
Ketones, UA: NEGATIVE
Nitrite, UA: NEGATIVE
Spec Grav, UA: 1.015
Urobilinogen, UA: NEGATIVE
pH, UA: 5

## 2014-08-06 ENCOUNTER — Encounter: Payer: Self-pay | Admitting: Family Medicine

## 2014-08-14 DIAGNOSIS — D165 Benign neoplasm of lower jaw bone: Secondary | ICD-10-CM | POA: Diagnosis not present

## 2014-08-14 DIAGNOSIS — Z1211 Encounter for screening for malignant neoplasm of colon: Secondary | ICD-10-CM | POA: Diagnosis not present

## 2014-08-14 DIAGNOSIS — D126 Benign neoplasm of colon, unspecified: Secondary | ICD-10-CM | POA: Diagnosis not present

## 2014-08-14 LAB — HM COLONOSCOPY: HM Colonoscopy: 6

## 2014-08-20 ENCOUNTER — Encounter: Payer: Self-pay | Admitting: Medical

## 2014-08-20 DIAGNOSIS — Z124 Encounter for screening for malignant neoplasm of cervix: Secondary | ICD-10-CM | POA: Diagnosis not present

## 2014-08-23 ENCOUNTER — Encounter: Payer: Self-pay | Admitting: Internal Medicine

## 2014-08-28 ENCOUNTER — Encounter: Payer: Self-pay | Admitting: Medical

## 2014-09-06 ENCOUNTER — Other Ambulatory Visit: Payer: Self-pay | Admitting: Medical

## 2014-09-06 MED ORDER — RIVAROXABAN 20 MG PO TABS: 20.0000 mg | ORAL_TABLET | Freq: Every day | ORAL | Status: DC

## 2014-09-08 ENCOUNTER — Telehealth: Payer: Self-pay | Admitting: Medical

## 2014-09-10 NOTE — Telephone Encounter (Signed)
lm

## 2014-09-19 ENCOUNTER — Other Ambulatory Visit: Payer: Self-pay

## 2014-09-19 DIAGNOSIS — Z1231 Encounter for screening mammogram for malignant neoplasm of breast: Secondary | ICD-10-CM

## 2014-09-25 ENCOUNTER — Other Ambulatory Visit (INDEPENDENT_AMBULATORY_CARE_PROVIDER_SITE_OTHER): Payer: Medicare Other

## 2014-09-25 DIAGNOSIS — Z23 Encounter for immunization: Secondary | ICD-10-CM | POA: Diagnosis not present

## 2014-10-08 ENCOUNTER — Ambulatory Visit: Payer: BC Managed Care – PPO

## 2014-10-11 ENCOUNTER — Ambulatory Visit
Admission: RE | Admit: 2014-10-11 | Discharge: 2014-10-11 | Disposition: A | Discharge status: Home / Self Care | Payer: Medicare Other | Source: Ambulatory Visit

## 2014-10-11 DIAGNOSIS — Z1231 Encounter for screening mammogram for malignant neoplasm of breast: Secondary | ICD-10-CM

## 2014-10-25 ENCOUNTER — Other Ambulatory Visit: Payer: Self-pay | Admitting: Medical

## 2014-10-25 NOTE — Telephone Encounter (Signed)
I am getting several refill requests sent to me that don't show the actual medication such as one on Brenda Cunningham today.   Not sure what is happening, but on my end I see the note from medical assistant but the medication is not listed.   Thus, I have no idea what mediation we are referring to.

## 2014-10-25 NOTE — Telephone Encounter (Signed)
Is this okay to refill? 

## 2014-10-29 ENCOUNTER — Ambulatory Visit (INDEPENDENT_AMBULATORY_CARE_PROVIDER_SITE_OTHER): Payer: Medicare Other | Admitting: Medical

## 2014-10-29 ENCOUNTER — Encounter: Payer: Self-pay | Admitting: Medical

## 2014-10-29 VITALS — BP 120/82 | HR 68 | Temp 98.4°F | Resp 16 | Wt 212.0 lb

## 2014-10-29 DIAGNOSIS — J069 Acute upper respiratory infection, unspecified: Secondary | ICD-10-CM | POA: Diagnosis not present

## 2014-10-29 DIAGNOSIS — J349 Unspecified disorder of nose and nasal sinuses: Secondary | ICD-10-CM

## 2014-10-29 DIAGNOSIS — J309 Allergic rhinitis, unspecified: Secondary | ICD-10-CM

## 2014-10-29 NOTE — Patient Instructions (Signed)
Currently symptoms suggest a cold/viral upper respiratory infection.  Given your year round "sinus problems" though...   Recommendations:  Rest  Drink plenty of water daily to help your body get rid of secretions  For the next 4-5 days, use nasal saline flush to clear mucous from the nose/sinuses.  Wait at least 30 minutes after doing nasal saline before using nasal medications such as Nasonex or Flonase  Begin samples of Nasonex, 1 spray per nostril twice daily  If this works well, let me know and I'll send prescription.    otherwise you can use Flonase nasal spray, 1 spray per nostril daily instead of Nasonex.    Using Saline Nose Drops with Bulb Syringe A bulb syringe is used to clear your nose. You may use it when you have a stuffy nose, nasal congestion, sinus pressure, or sneezing.   SALINE SOLUTION You can buy nose drops at your local drug store. You can also make nose drops yourself. Mix 1 cup of water with  teaspoon of salt. Stir. Store this mixture at room temperature. Make a new batch daily.  USE THE BULB IN COMBINATION WITH SALINE NOSE DROPS  Squeeze the air out of the bulb before suctioning the saline mixture.  While still squeezing the bulb flat, place the tip of the bulb into the saline mixture.  Let air come back into the bulb.  This will suction up the saline mixture.  Gently flush one nostril at a time.  Salt water nose drops will then moisten your  congested nose and loosen secretions before suctioning.  Use the bulb syringe as directed below to suction.  USING THE BULB SYRINGE TO SUCTION  While still squeezing the bulb flat, place the tip of the bulb into a nostril. Let air come back into the bulb. The suction will pull snot out of the nose and into the bulb.  Repeat on the other nostril.  Squeeze syringe several times into a tissue.  CLEANING THE BULB SYRINGE  Clean the bulb syringe every day with hot soapy water.  Clean the inside of the bulb by  squeezing the bulb while the tip is in soapy water.  Rinse by squeezing the bulb while the tip is in clean hot water.  Store the bulb with the tip side down on paper towel.  HOME CARE INSTRUCTIONS   Use saline nose drops often to keep the nose open and not stuffy.  Throw away used salt water. Make a new solution every time.  Do not use the same solution and dropper for another person  If you do not prefer to use nasal saline flush, other options include nasal saline spray or the [employer], both of which are available over the counter at your pharmacy.

## 2014-10-29 NOTE — Progress Notes (Signed)
Subjective:  Brenda Cunningham is a 63 y.o. female who presents for possible headache and sinus pressure, says she has sinus problems year round since moving to [facility].  Recent symptoms include h4-5 days hx/o headache, sinus pressure, headache on side of nose stopped up since last wednesday, has had sore throat, ear pressure.  Sinuses fell full.   Denies cough, fever, NVD, no sick contacts.  Patient is a non-smoker.  Using Tylenol, allergy pill OTC for symptoms.  Denies sick contacts.  No other aggravating or relieving factors.  No other c/o.  ROS as in subjective   Objective: Filed Vitals:   10/29/14 1147  BP: 120/82  Pulse: 68  Temp: 98.4 F (36.9 C)  Resp: 16    General appearance: Alert, WD/WN, no distress                             Skin: warm, no rash                           Head: + mild maxillary sinus tenderness                            Eyes: conjunctiva normal, corneas clear, PERRLA                            Ears: pearly TMs, external ear canals normal                          Nose: septum midline, turbinates swollen, with erythema and clear discharge             Mouth/throat: MMM, tongue normal, mild pharyngeal erythema                           Neck: supple, no adenopathy, no thyromegaly, nontender                         Lungs: CTA bilaterally, no wheezes, rales, or rhonchi      Assessment and Plan:  Encounter Diagnoses  Name Primary?  . Viral upper respiratory infection Yes  . Sinus problem   . Allergic rhinitis, unspecified allergic rhinitis type     currently her symptoms suggest viral upper respiratory infection.  We discussed her year-round new sinus/allergy symptoms since moving to [place].  Begin samples of Nasonex, let me know if this works well, otherwise increase water intake short-term, nasal saline flush as discussed, and can either continue Nasonex or Flonase moving forward. If worse symptoms in the next few days such as fever, consistent colored mucus  from sinuses or other, then call or return

## 2014-11-20 ENCOUNTER — Telehealth: Payer: Self-pay | Admitting: Family Medicine

## 2014-11-27 NOTE — Telephone Encounter (Signed)
P.A. XARELTO approved til 11/20/15, pt informed

## 2014-12-02 ENCOUNTER — Other Ambulatory Visit: Payer: Self-pay | Admitting: Medical

## 2014-12-03 NOTE — Telephone Encounter (Signed)
Is this okay to refill? 

## 2015-01-25 DIAGNOSIS — R918 Other nonspecific abnormal finding of lung field: Secondary | ICD-10-CM | POA: Diagnosis not present

## 2015-01-25 DIAGNOSIS — R938 Abnormal findings on diagnostic imaging of other specified body structures: Secondary | ICD-10-CM | POA: Diagnosis not present

## 2015-01-25 DIAGNOSIS — I10 Essential (primary) hypertension: Secondary | ICD-10-CM | POA: Diagnosis not present

## 2015-01-25 DIAGNOSIS — J209 Acute bronchitis, unspecified: Secondary | ICD-10-CM | POA: Diagnosis not present

## 2015-01-25 DIAGNOSIS — R0602 Shortness of breath: Secondary | ICD-10-CM | POA: Diagnosis not present

## 2015-01-25 DIAGNOSIS — Z853 Personal history of malignant neoplasm of breast: Secondary | ICD-10-CM | POA: Diagnosis not present

## 2015-02-04 ENCOUNTER — Ambulatory Visit
Admission: RE | Admit: 2015-02-04 | Discharge: 2015-02-04 | Disposition: A | Discharge status: Home / Self Care | Payer: Medicare Other | Source: Ambulatory Visit | Attending: Medical | Admitting: Medical

## 2015-02-04 ENCOUNTER — Encounter: Payer: Self-pay | Admitting: Medical

## 2015-02-04 ENCOUNTER — Other Ambulatory Visit: Payer: Self-pay | Admitting: Medical

## 2015-02-04 ENCOUNTER — Ambulatory Visit (INDEPENDENT_AMBULATORY_CARE_PROVIDER_SITE_OTHER): Payer: Medicare Other | Admitting: Medical

## 2015-02-04 VITALS — BP 122/80 | HR 80 | Temp 98.0°F | Resp 16 | Wt 210.0 lb

## 2015-02-04 DIAGNOSIS — R938 Abnormal findings on diagnostic imaging of other specified body structures: Secondary | ICD-10-CM | POA: Diagnosis not present

## 2015-02-04 DIAGNOSIS — M25552 Pain in left hip: Secondary | ICD-10-CM

## 2015-02-04 DIAGNOSIS — R05 Cough: Secondary | ICD-10-CM

## 2015-02-04 DIAGNOSIS — R059 Cough, unspecified: Secondary | ICD-10-CM

## 2015-02-04 DIAGNOSIS — Z8611 Personal history of tuberculosis: Secondary | ICD-10-CM

## 2015-02-04 DIAGNOSIS — M1612 Unilateral primary osteoarthritis, left hip: Secondary | ICD-10-CM | POA: Diagnosis not present

## 2015-02-04 DIAGNOSIS — R9389 Abnormal findings on diagnostic imaging of other specified body structures: Secondary | ICD-10-CM

## 2015-02-04 MED ORDER — BUDESONIDE-FORMOTEROL FUMARATE 80-4.5 MCG/ACT IN AERO: 2.0000 | INHALATION_SPRAY | Freq: Two times a day (BID) | RESPIRATORY_TRACT | Status: DC

## 2015-02-04 MED ORDER — HYDROCODONE-HOMATROPINE 5-1.5 MG/5ML PO SYRP: 5.0000 mL | ORAL_SOLUTION | Freq: Three times a day (TID) | ORAL | Status: DC | PRN

## 2015-02-04 MED ORDER — ALBUTEROL SULFATE HFA 108 (90 BASE) MCG/ACT IN AERS: 2.0000 | INHALATION_SPRAY | Freq: Four times a day (QID) | RESPIRATORY_TRACT | Status: DC | PRN

## 2015-02-04 MED ORDER — PREDNISONE 20 MG PO TABS: ORAL_TABLET | ORAL | Status: DC

## 2015-02-04 NOTE — Addendum Note (Signed)
Addended by: Minette Headland A on: 02/04/2015 02:37 PM   Modules accepted: Orders

## 2015-02-04 NOTE — Progress Notes (Signed)
Subjective: Was out of town recently in [place].   Was helping her daughter at the day care, one kid sneezed and coughed right on her.  Several days later got sick, ended up going to the ED.  Was treated for bronchitis, was given Levaquin antibiotic, Albuterol inhaler, medrol Dosepak.  Feels improved but not completley clear.  Still has a lot of cough.  Lately more dry cough.   Fatigued.  No current sore throat, fever.  Ears feel a little achy.  No NVD.  No wheezing or SOB.  Having some sweats.  She has used up all the medication for the ED in [place].  She is also concerned as the CXR showed abnormality and cancer was mentioned which really freaked her out.   Wants me to look over things/papers from the recent visit.  She notes hx/o lifetime nonsmoker.  No current exposures that she is concerned about.    Generally uses allegra for allergies which she uses during seasons.   She notes hx/o TB 45 years ago, treated.  No other aggravating or relieving factors. No other complaint.  ROS as in subjective  Objective: Gen: wd, wn, nad HENT unremarkable Heart: irregularly irregular, no obvios murmur Lungs: scattered wheezes, pulse ox 99% room air, no rhonchi or rales, decrease breath sounds in general Ext: no edema Pulses normal except for irregular MSK: pain with left hip ROM, decrease external ROM, similar but less pain right ROM, otherwise legs nontender   Assessment: Encounter Diagnoses  Name Primary?  . Cough Yes  . Abnormal chest x-ray   . History of tuberculosis   . Left hip pain    Plan: Cough - like residual bronchitis.   Reviewed recent ED report, labs with mildly elevated WBC, and abnormal CXR from [place].  Gave sample of Symbicort but not to use until we call her back.   Advised rest, c/t Albuterol rescue inhaler prn.  She has completed Levaquin and medrol dosepak.   F/u pending xray.    recent abnormal CXR, hx/o TB 45 years ago.  Reviewed her recent CXR from [facility] in  [place], [place].  Question about right apical pleural cap.  Will send for repeat CXR today.  Left hip pain, hx/o OA - go for xray today.  Last xray 2 years ago in [place].

## 2015-02-11 ENCOUNTER — Other Ambulatory Visit: Payer: Self-pay | Admitting: Medical

## 2015-02-11 ENCOUNTER — Ambulatory Visit (INDEPENDENT_AMBULATORY_CARE_PROVIDER_SITE_OTHER): Payer: Medicare Other | Admitting: Medical

## 2015-02-11 ENCOUNTER — Telehealth: Payer: Self-pay | Admitting: Medical

## 2015-02-11 ENCOUNTER — Encounter: Payer: Self-pay | Admitting: Medical

## 2015-02-11 VITALS — BP 122/80 | HR 72 | Temp 98.2°F | Resp 16 | Wt 209.0 lb

## 2015-02-11 DIAGNOSIS — Z8611 Personal history of tuberculosis: Secondary | ICD-10-CM | POA: Diagnosis not present

## 2015-02-11 DIAGNOSIS — M25559 Pain in unspecified hip: Secondary | ICD-10-CM | POA: Diagnosis not present

## 2015-02-11 DIAGNOSIS — R059 Cough, unspecified: Secondary | ICD-10-CM

## 2015-02-11 DIAGNOSIS — R05 Cough: Secondary | ICD-10-CM | POA: Diagnosis not present

## 2015-02-11 MED ORDER — HYDROCODONE-ACETAMINOPHEN 5-325 MG PO TABS: 1.0000 | ORAL_TABLET | Freq: Four times a day (QID) | ORAL | Status: DC | PRN

## 2015-02-11 NOTE — Telephone Encounter (Signed)
lm

## 2015-02-11 NOTE — Progress Notes (Signed)
Subjective: Here today with her daughter.    Here for f/u on cough, hip pain, and recent xrays for chest and hips.  Has hx/o TB about 45 years ago, was treated for 4 months with streptomycin and isoniazid.   Since last week improved, but still has cough.  Using Symbicort and albuterol prn.  Using hycodan prn.   No NVD.  No wheezing or SOB.  Having some sweats.  She notes hx/o lifetime nonsmoker.  No current exposures that she is concerned about.    Generally uses allegra for allergies which she uses during seasons.  No other aggravating or relieving factors. No other complaint.   ROS as in subjective  Objective: Gen: wd, wn, nad HENT unremarkable Heart: irregularly irregular, no obvios murmur Lungs: scattered wheezes, no rhonchi or rales, decreased breath sounds in general Ext: no edema Pulses normal except for irregular MSK: pain with left hip ROM, decrease external ROM, similar but less pain right ROM, otherwise legs nontender   Assessment: Encounter Diagnoses  Name Primary?  . Cough Yes  . Hip pain, unspecified laterality   . History of tuberculosis       Plan: Cough - continue Symbicort 2 puffs twice daily, albuterol as needed, Hycodan syrup as needed, Mucinex plain twice daily for the next 5 days, continue hydration and cough should gradually resolve  Hip pains-reviewed recent x-ray, discussed routine stretching and exercise routine and if pain continues or worsens consider MRI or orthopedic referral

## 2015-02-15 ENCOUNTER — Encounter: Payer: Self-pay | Admitting: Medical

## 2015-02-27 DIAGNOSIS — I482 Chronic atrial fibrillation: Secondary | ICD-10-CM | POA: Diagnosis not present

## 2015-02-27 DIAGNOSIS — I10 Essential (primary) hypertension: Secondary | ICD-10-CM | POA: Diagnosis not present

## 2015-02-27 DIAGNOSIS — Z8673 Personal history of transient ischemic attack (TIA), and cerebral infarction without residual deficits: Secondary | ICD-10-CM | POA: Diagnosis not present

## 2015-03-02 ENCOUNTER — Other Ambulatory Visit: Payer: Self-pay | Admitting: Medical

## 2015-06-01 ENCOUNTER — Other Ambulatory Visit: Payer: Self-pay | Admitting: Medical

## 2015-06-24 DIAGNOSIS — Z23 Encounter for immunization: Secondary | ICD-10-CM | POA: Diagnosis not present

## 2015-07-25 ENCOUNTER — Other Ambulatory Visit: Payer: Self-pay

## 2015-07-25 ENCOUNTER — Telehealth: Payer: Self-pay | Admitting: Medical

## 2015-07-25 ENCOUNTER — Other Ambulatory Visit: Payer: Self-pay | Admitting: Medical

## 2015-07-25 MED ORDER — VERAPAMIL HCL ER 240 MG PO TBCR: 240.0000 mg | EXTENDED_RELEASE_TABLET | Freq: Every day | ORAL | Status: DC

## 2015-07-25 NOTE — Telephone Encounter (Signed)
Pt called for refill of verapamil. Please send to [facility] on [address].

## 2015-07-25 NOTE — Telephone Encounter (Signed)
Left message for patient to call for exam with [facility]. Only provided #30 until patient comes in.

## 2015-07-25 NOTE — Telephone Encounter (Signed)
done

## 2015-07-26 ENCOUNTER — Other Ambulatory Visit: Payer: Self-pay | Admitting: Medical

## 2015-07-26 ENCOUNTER — Telehealth: Payer: Self-pay | Admitting: Medical

## 2015-07-26 NOTE — Telephone Encounter (Signed)
Is this okay to refill? 

## 2015-07-26 NOTE — Telephone Encounter (Signed)
Set up for Annual Wellness visit + med check (physical)

## 2015-07-29 ENCOUNTER — Emergency Department (HOSPITAL_COMMUNITY): Payer: Medicare Other

## 2015-07-29 ENCOUNTER — Emergency Department (HOSPITAL_COMMUNITY)
Admission: EM | Admit: 2015-07-29 | Discharge: 2015-07-30 | Disposition: A | Discharge status: Other Acute Inpatient Hospital | Payer: Medicare Other | Attending: Emergency Medicine | Admitting: Emergency Medicine

## 2015-07-29 ENCOUNTER — Encounter (HOSPITAL_COMMUNITY): Payer: Self-pay | Admitting: *Deleted

## 2015-07-29 DIAGNOSIS — J984 Other disorders of lung: Secondary | ICD-10-CM | POA: Diagnosis not present

## 2015-07-29 DIAGNOSIS — I10 Essential (primary) hypertension: Secondary | ICD-10-CM | POA: Insufficient documentation

## 2015-07-29 DIAGNOSIS — Z8739 Personal history of other diseases of the musculoskeletal system and connective tissue: Secondary | ICD-10-CM | POA: Insufficient documentation

## 2015-07-29 DIAGNOSIS — I63412 Cerebral infarction due to embolism of left middle cerebral artery: Secondary | ICD-10-CM

## 2015-07-29 DIAGNOSIS — Z853 Personal history of malignant neoplasm of breast: Secondary | ICD-10-CM | POA: Diagnosis not present

## 2015-07-29 DIAGNOSIS — I69992 Facial weakness following unspecified cerebrovascular disease: Secondary | ICD-10-CM | POA: Diagnosis not present

## 2015-07-29 DIAGNOSIS — I4891 Unspecified atrial fibrillation: Secondary | ICD-10-CM | POA: Insufficient documentation

## 2015-07-29 DIAGNOSIS — Z8659 Personal history of other mental and behavioral disorders: Secondary | ICD-10-CM | POA: Insufficient documentation

## 2015-07-29 DIAGNOSIS — I639 Cerebral infarction, unspecified: Secondary | ICD-10-CM | POA: Diagnosis not present

## 2015-07-29 DIAGNOSIS — Z8639 Personal history of other endocrine, nutritional and metabolic disease: Secondary | ICD-10-CM | POA: Diagnosis not present

## 2015-07-29 DIAGNOSIS — I6789 Other cerebrovascular disease: Secondary | ICD-10-CM | POA: Diagnosis not present

## 2015-07-29 DIAGNOSIS — R4701 Aphasia: Secondary | ICD-10-CM | POA: Diagnosis present

## 2015-07-29 DIAGNOSIS — Z4682 Encounter for fitting and adjustment of non-vascular catheter: Secondary | ICD-10-CM | POA: Diagnosis not present

## 2015-07-29 HISTORY — DX: Cerebral infarction, unspecified: I63.9

## 2015-07-29 LAB — CBC
HCT: 34.6 % — ABNORMAL LOW (ref 36.0–46.0)
Hemoglobin: 11.9 g/dL — ABNORMAL LOW (ref 12.0–15.0)
MCH: 33.4 pg (ref 26.0–34.0)
MCHC: 34.4 g/dL (ref 30.0–36.0)
MCV: 97.2 fL (ref 78.0–100.0)
Platelets: 279 10*3/uL (ref 150–400)
RBC: 3.56 MIL/uL — ABNORMAL LOW (ref 3.87–5.11)
RDW: 14.4 % (ref 11.5–15.5)
WBC: 7.6 10*3/uL (ref 4.0–10.5)

## 2015-07-29 LAB — COMPREHENSIVE METABOLIC PANEL
ALT: 19 U/L (ref 14–54)
AST: 21 U/L (ref 15–41)
Albumin: 3.7 g/dL (ref 3.5–5.0)
Alkaline Phosphatase: 95 U/L (ref 38–126)
Anion gap: 11 (ref 5–15)
BUN: 13 mg/dL (ref 6–20)
CO2: 24 mmol/L (ref 22–32)
Calcium: 9.5 mg/dL (ref 8.9–10.3)
Chloride: 106 mmol/L (ref 101–111)
Creatinine, Ser: 0.9 mg/dL (ref 0.44–1.00)
GFR calc Af Amer: >60 mL/min (ref >60)
GFR calc non Af Amer: >60 mL/min (ref >60)
Glucose, Bld: 133 mg/dL — ABNORMAL HIGH (ref 65–99)
Potassium: 3.8 mmol/L (ref 3.5–5.1)
Sodium: 141 mmol/L (ref 135–145)
Total Bilirubin: 0.8 mg/dL (ref 0.3–1.2)
Total Protein: 7.5 g/dL (ref 6.5–8.1)

## 2015-07-29 LAB — I-STAT TROPONIN, ED: Troponin i, poc: 0 ng/mL (ref 0.00–0.08)

## 2015-07-29 LAB — DIFFERENTIAL
Basophils Absolute: 0 10*3/uL (ref 0.0–0.1)
Basophils Relative: 0 % (ref 0–1)
Eosinophils Absolute: 0.2 10*3/uL (ref 0.0–0.7)
Eosinophils Relative: 2 % (ref 0–5)
Lymphocytes Relative: 31 % (ref 12–46)
Lymphs Abs: 2.3 10*3/uL (ref 0.7–4.0)
Monocytes Absolute: 0.7 10*3/uL (ref 0.1–1.0)
Monocytes Relative: 9 % (ref 3–12)
Neutro Abs: 4.4 10*3/uL (ref 1.7–7.7)
Neutrophils Relative %: 58 % (ref 43–77)

## 2015-07-29 LAB — I-STAT CHEM 8, ED
BUN: 20 mg/dL (ref 6–20)
Calcium, Ion: 1.21 mmol/L (ref 1.13–1.30)
Chloride: 105 mmol/L (ref 101–111)
Creatinine, Ser: 0.9 mg/dL (ref 0.44–1.00)
Glucose, Bld: 133 mg/dL — ABNORMAL HIGH (ref 65–99)
HCT: 42 % (ref 36.0–46.0)
Hemoglobin: 14.3 g/dL (ref 12.0–15.0)
Potassium: 3.8 mmol/L (ref 3.5–5.1)
Sodium: 144 mmol/L (ref 135–145)
TCO2: 25 mmol/L (ref 0–100)

## 2015-07-29 LAB — PROTIME-INR
INR: 1.49 (ref 0.00–1.49)
Prothrombin Time: 18 seconds — ABNORMAL HIGH (ref 11.6–15.2)

## 2015-07-29 LAB — ETHANOL: Alcohol, Ethyl (B): 41 mg/dL — ABNORMAL HIGH (ref <5)

## 2015-07-29 LAB — APTT: aPTT: 31 seconds (ref 24–37)

## 2015-07-29 MED ORDER — ETOMIDATE 2 MG/ML IV SOLN
INTRAVENOUS | Status: AC | PRN
  Administered 2015-07-29: 20 mg via INTRAVENOUS

## 2015-07-29 MED ORDER — SUCCINYLCHOLINE CHLORIDE 20 MG/ML IJ SOLN
INTRAMUSCULAR | Status: AC | PRN
  Administered 2015-07-29: 100 mg via INTRAVENOUS

## 2015-07-29 MED ORDER — SUCCINYLCHOLINE CHLORIDE 20 MG/ML IJ SOLN
INTRAMUSCULAR | Status: DC
Start: 2015-07-29 — End: 2015-07-30
  Filled 2015-07-29: qty 1

## 2015-07-29 MED ORDER — LABETALOL HCL 5 MG/ML IV SOLN
10.0000 mg | Freq: Once | INTRAVENOUS | Status: DC
  Filled 2015-07-29: qty 4

## 2015-07-29 MED ORDER — ETOMIDATE 2 MG/ML IV SOLN
INTRAVENOUS | Status: DC
Start: 2015-07-29 — End: 2015-07-30
  Filled 2015-07-29: qty 20

## 2015-07-29 MED ORDER — ROCURONIUM BROMIDE 50 MG/5ML IV SOLN
INTRAVENOUS | Status: DC
Start: 2015-07-29 — End: 2015-07-30
  Filled 2015-07-29: qty 2

## 2015-07-29 MED ORDER — LIDOCAINE HCL (CARDIAC) 20 MG/ML IV SOLN
INTRAVENOUS | Status: AC
  Filled 2015-07-29: qty 5

## 2015-07-29 MED ORDER — PROPOFOL 1000 MG/100ML IV EMUL
INTRAVENOUS | Status: AC
  Administered 2015-07-29: 1000 mg
  Filled 2015-07-29: qty 100

## 2015-07-29 NOTE — Code Documentation (Signed)
ET tube placed and removed by Dr.Balleh, pt vomiting through tube.

## 2015-07-29 NOTE — ED Provider Notes (Addendum)
Patient presented to the ER with stroke symptoms. Patient had gone to sleep at approximately 6 PM. She arrived in the emergency department at 10:37 PM, having awakened from sleep with paralysis of right side.  Face to face Exam: HEENT - PERRLA, persistent left gaze with right gaze palsy Lungs - CTAB Heart - RRR, no M/R/G Abd - S/NT/ND Neuro - awake, noncommunicative, flaccid right extremities  Plan: Patient arrived in the emergency department as a code stroke. Initial survey revealed that she is on Xarelto, not a candidate for IV TPA. She was awake and alert at arrival, stable for CT scan. Upon returning to the ER from radiology, however, she had a decline, now exhibiting sonorous respirations. Decision to intubate was made. Dr. Janann Colonel, on call for neurology has contacted neuro interventional radiology at [facility] (no one on-call at [facility]). Patient accepted for transfer for possible neuro intervention.   CRITICAL CARE Performed by: Orpah Greek   Total critical care time: 67min  Critical care time was exclusive of separately billable procedures and treating other patients.  Critical care was necessary to treat or prevent imminent or life-threatening deterioration.  Critical care was time spent personally by me on the following activities: development of treatment plan with patient and/or surrogate as well as nursing, discussions with consultants, evaluation of patient's response to treatment, examination of patient, obtaining history from patient or surrogate, ordering and performing treatments and interventions, ordering and review of laboratory studies, ordering and review of radiographic studies, pulse oximetry and re-evaluation of patient's condition.  Orpah Greek, MD 07/29/15 2585  Orpah Greek, MD 07/30/15 [phone]

## 2015-07-29 NOTE — ED Notes (Signed)
[facility] CALLED @[phone] 

## 2015-07-29 NOTE — Progress Notes (Signed)
Code Stroke called on 64 y.o female. Per family via EMS Pt went to sleep at 6pm normal and awakened at 2130 unable to speak, right facial droop and weakness. Pt taken to CT scan after examined by EDP. CT scan negative for acute bleed per Neurologist Dr. Janann Colonel.  Glucose 133. Pertinent history includes HTN, Afib, Hyerlipidemia, Stroke. NIHSS 26 for flaccid right side, droop, complete right side neglect, complete hemianopia, and  left side forced gaze. Pt out of window for TPA and per her medicine list she is currently taking xarelto. Dr. Arizona Constable on call for Interventional Radiology at [facility] called per Dr. Janann Colonel. Pt to transfer tonight to [facility] for further evaluation and possible treatment. Family updated per Dr. Janann Colonel.

## 2015-07-29 NOTE — Consult Note (Signed)
Stroke Consult    Chief Complaint: right sided weakness HPI: Brenda Cunningham is an 64 y.o. female hx of A fib, on Xarelto, prior stroke presenting as code stroke. Per family, LSW at 81 when she took a nap. They found her around 2130 and noted she was unable to communicate and was not moving her right side. Upon EMS arrival noted BP of 160/103, with a left gaze preference. Brought to ED, was intubated for airway protection. CT head imaging reviewed and no signs of acute infarct. NIHSS of 26  Date last known well: 8/22 Time last known well: 1800 tPA Given: No, outside IV tPA window and on Xarelto Modified Rankin: Rankin Score=0  Past Medical History  Diagnosis Date  . Hypertension   . Arthritis   . A-fib   . Hyperlipidemia   . Stroke 2010  . Breast cancer 2008    s/p lumpectomy, chemo and radiation therapy  . Wears glasses   . Anxiety     Past Surgical History  Procedure Laterality Date  . Breast lumpectomy  2008    left  . Appendectomy    . Tubal ligation    . Colonoscopy  2004    No family history on file. Social History:  reports that she has never smoked. She does not have any smokeless tobacco history on file. She reports that she drinks about 1.2 oz of alcohol per week. She reports that she does not use illicit drugs.  Allergies:  Allergies  Allergen Reactions  . Codeine Hives     (Not in a hospital admission)  ROS: Out of a complete 14 system review, the patient complains of only the following symptoms, and all other reviewed systems are negative. +weakness, aphasia  Physical Examination: Filed Vitals:   07/29/15 2322  BP: 146/80  Pulse: 78  Resp: 22   Physical Exam  Constitutional: He appears well-developed and well-nourished.  Psych: Affect appropriate to situation Eyes: No scleral injection HENT: No OP obstrucion Head: Normocephalic.  Cardiovascular: Normal rate and regular rhythm.  Respiratory: Effort normal and breath sounds normal.  GI:  Soft. Bowel sounds are normal. No distension. There is no tenderness.  Skin: WDI  Neurologic Examination: Mental Status: Lethargic, opens eyes with noxious stimuli, non-verbal, not following commands Cranial Nerves: II: optic discs not visualized, left gaze preference, eyes will not cross midline, PERRL III,IV, VI: ptosis not present, left gaze preference V,VII: right facial weakness VIII: unable to test IX,X: gag reflex present XI: unable to test XII: unable to test Motor: Flaccid RUE and RLE weakness Will hold LUE and LLE against gravity Sensory: no withdrawal to noxious stimuli on RUE or RLE Deep Tendon Reflexes: 2+ and symmetric throughout Plantars: Right: Mute   Left: downgoing Cerebellar: Unable to test Gait: unable to test  Laboratory Studies:   Basic Metabolic Panel:  Recent Labs Lab 07/29/15 2239 07/29/15 2246  NA 141 144  K 3.8 3.8  CL 106 105  CO2 24  --   GLUCOSE 133* 133*  BUN 13 20  CREATININE 0.90 0.90  CALCIUM 9.5  --     Liver Function Tests:  Recent Labs Lab 07/29/15 2239  AST 21  ALT 19  ALKPHOS 95  BILITOT 0.8  PROT 7.5  ALBUMIN 3.7   No results for input(s): LIPASE, AMYLASE in the last 168 hours. No results for input(s): AMMONIA in the last 168 hours.  CBC:  Recent Labs Lab 07/29/15 2239 07/29/15 2246  WBC 7.6  --  NEUTROABS 4.4  --   HGB 11.9* 14.3  HCT 34.6* 42.0  MCV 97.2  --   PLT 279  --     Cardiac Enzymes: No results for input(s): CKTOTAL, CKMB, CKMBINDEX, TROPONINI in the last 168 hours.  BNP: Invalid input(s): POCBNP  CBG: No results for input(s): GLUCAP in the last 168 hours.  Microbiology: No results found for this or any previous visit.  Coagulation Studies:  Recent Labs  07/29/15 2239  LABPROT 18.0*  INR 1.49    Urinalysis: No results for input(s): COLORURINE, LABSPEC, PHURINE, GLUCOSEU, HGBUR, BILIRUBINUR, KETONESUR, PROTEINUR, UROBILINOGEN, NITRITE, LEUKOCYTESUR in the last 168  hours.  Invalid input(s): APPERANCEUR  Lipid Panel:     Component Value Date/Time   CHOL 152 11/20/2013 0831   TRIG 58 11/20/2013 0831   HDL 61 11/20/2013 0831   CHOLHDL 2.5 11/20/2013 0831   VLDL 12 11/20/2013 0831   LDLCALC 79 11/20/2013 0831    HgbA1C: No results found for: HGBA1C  Urine Drug Screen:  No results found for: LABOPIA, COCAINSCRNUR, LABBENZ, AMPHETMU, THCU, LABBARB  Alcohol Level:  Recent Labs Lab 07/29/15 2239  ETH 41*    Other results:  Imaging: Ct Head Wo Contrast  07/29/2015   CLINICAL DATA:  Right-sided weakness and facial droop.  Code stroke.  EXAM: CT HEAD WITHOUT CONTRAST  TECHNIQUE: Contiguous axial images were obtained from the base of the skull through the vertex without intravenous contrast.  COMPARISON:  None.  FINDINGS: No intracranial hemorrhage, mass effect, or midline shift. No evidence of territorial infarct or acute ischemia. No hydrocephalus. The basilar cisterns are patent. No intracranial fluid collection. Calvarium is intact. Included paranasal sinuses and mastoid air cells are well aerated.  IMPRESSION: No intracranial hemorrhage or findings of acute ischemia on noncontrast head CT.  These results were called by telephone at the time of interpretation on 07/29/2015 at 10:56 PM to Dr. Janann Colonel , who verbally acknowledged these results.   Electronically Signed   By: Jeb Levering M.D.   On: 07/29/2015 23:05    Assessment: 64 y.o. female hx of A fib, on Xarelto, prior stroke presenting with acute onset of right sided weakness, left gaze preference and aphasia. Concern for large L MCA infarct with NIHSS of 26. Not a tPA candidate as she was out of the window and on Xarelto. Discussed case with neuro-IR, Dr Arizona Constable. Will transfer patient to [facility] for further evaluation and possible intervention. Discussed with family who expresses understanding.   Plan: 1. Transfer to [facility] for Neuro-IR intervention 2. Further stroke workup per  admitting team  This patient is critically ill and at significant risk of neurological worsening, death and care requires constant monitoring of vital signs, hemodynamics,respiratory and cardiac monitoring,review of multiple databases, neurological assessment, discussion with family, other specialists and medical decision making of high complexity. I spent 45 minutes of neurocritical care time in the care of this patient.    Jim Like, DO [facility] [phone]  If 7pm- 7am, please page neurology on call as listed in [place]. 07/29/2015, 11:41 PM

## 2015-07-29 NOTE — ED Provider Notes (Signed)
History   Chief Complaint  Patient presents with  . Code Stroke    HPI Patient is a 64 year old female with past medical history as below notable for atrial fibrillation on Xarelto, prior stroke with unknown residual deficits, who is presenting as code stroke via EMS. Per EMS report, patient laid down for a nap at 6 PM awoke 1 hour prior to arrival with right-sided paralysis, aphasia, leftward deviated eyes. EMS reports patient was hypertensive during transport and had a brief episode of hypoxia which resolved with nonrebreather. History is limited at this time secondary to patient condition and no family members being present.   Past medical/surgical history, social history, medications, allergies and FH have been reviewed with patient and/or in documentation. Furthermore, if pt family or friend(s) present, additional historical information was obtained from them.  Past Medical History  Diagnosis Date  . Hypertension   . Arthritis   . A-fib   . Hyperlipidemia   . Stroke 2010  . Breast cancer 2008    s/p lumpectomy, chemo and radiation therapy  . Wears glasses   . Anxiety    Past Surgical History  Procedure Laterality Date  . Breast lumpectomy  2008    left  . Appendectomy    . Tubal ligation    . Colonoscopy  2004   No family history on file. Social History  Substance Use Topics  . Smoking status: Never Smoker   . Smokeless tobacco: Not on file  . Alcohol Use: 1.2 oz/week    1 Glasses of wine, 1 Cans of beer per week     Review of Systems Unable to obtain severe to patient condition   Physical Exam  Physical Exam  ED Triage Vitals  Enc Vitals Group     BP 07/29/15 2304 152/78 mmHg     Pulse Rate 07/29/15 2304 82     Resp 07/29/15 2310 18     Temp --      Temp src --      SpO2 07/29/15 2304 100 %     Weight --      Height --      Head Cir --      [place] --      Pain Score --      Pain Loc --      Pain Edu? --      Excl. in [place]? --     Constitutional: Chronically ill-appearing 64 yo fem Head: Normocephalic and atraumatic.  Eyes: Extraocular motion intact, no scleral icterus Mouth: MMM, OP clear Neck: Supple without meningismus, mass, or overt JVD Respiratory: No respiratory distress. Normal WOB. No w/r/g. CV: RRR, no obvious murmurs.  Pulses +2 and symmetric. Euvolemic Abdomen: Soft, NT, ND, no r/g. No mass.  MSK: Extremities are atraumatic without deformity, ROM intact Skin: Warm, dry, intact without rash Neuro: HDS, GCS 12. PERRL, forced L gaze, R side neglect, Flaccid paralysis on R, 5/5 RUE/RLE     ED Course  Procedures INTUBATION Date/Time: 07/29/2015 11:21 PM  Performed by: Kirstie Peri  Authorized by: Kirstie Peri  Consent: The procedure was performed in an emergent situation. Indications: airway protection Intubation method: video-assisted Patient status: paralyzed (RSI) Preoxygenation: nonrebreather mask Sedatives: etomidate Paralytic: succinylcholine Laryngoscope size: Mac 4 Tube size: 7.0 mm Tube type: cuffed Number of attempts: 2 (first attempt with DL with significant redundant tissue, emesis, blood) Ventilation between attempts: BVM Cricoid pressure: yes Cords visualized: yes Post-procedure assessment: chest rise and ETCO2 monitor  Breath sounds:  equal and absent over the epigastrium  Cuff inflated: yes ETT to lip: 25 cm Tube secured with: ETT holder and adhesive tape  Chest x-ray interpreted by me and other physician. Chest x-ray findings: endotracheal tube in appropriate position Patient tolerance: Patient tolerated the procedure well with no immediate complications   Labs Reviewed  ETHANOL - Abnormal; Notable for the following:    Alcohol, Ethyl (B) 41 (*)    All other components within normal limits  PROTIME-INR - Abnormal; Notable for the following:    Prothrombin Time 18.0 (*)    All other components within normal limits  CBC - Abnormal; Notable for the following:     RBC 3.56 (*)    Hemoglobin 11.9 (*)    HCT 34.6 (*)    All other components within normal limits  COMPREHENSIVE METABOLIC PANEL - Abnormal; Notable for the following:    Glucose, Bld 133 (*)    All other components within normal limits  I-STAT CHEM 8, ED - Abnormal; Notable for the following:    Glucose, Bld 133 (*)    All other components within normal limits  APTT  DIFFERENTIAL  URINE RAPID DRUG SCREEN, HOSP PERFORMED  URINALYSIS, ROUTINE W REFLEX MICROSCOPIC (NOT AT [facility])  I-STAT TROPOININ, ED   I personally reviewed and interpreted all labs.  Ct Head Wo Contrast  07/29/2015   CLINICAL DATA:  Right-sided weakness and facial droop.  Code stroke.  EXAM: CT HEAD WITHOUT CONTRAST  TECHNIQUE: Contiguous axial images were obtained from the base of the skull through the vertex without intravenous contrast.  COMPARISON:  None.  FINDINGS: No intracranial hemorrhage, mass effect, or midline shift. No evidence of territorial infarct or acute ischemia. No hydrocephalus. The basilar cisterns are patent. No intracranial fluid collection. Calvarium is intact. Included paranasal sinuses and mastoid air cells are well aerated.  IMPRESSION: No intracranial hemorrhage or findings of acute ischemia on noncontrast head CT.  These results were called by telephone at the time of interpretation on 07/29/2015 at 10:56 PM to Dr. Janann Colonel , who verbally acknowledged these results.   Electronically Signed   By: Jeb Levering M.D.   On: 07/29/2015 23:05   I personally viewed above image(s) which were used in my medical decision making. Formal interpretations by Radiology.   EKG Interpretation  Date/Time:  Monday July 29 2015 22:56:33 EDT Ventricular Rate:  85 PR Interval:    QRS Duration: 78 QT Interval:  401 QTC Calculation: 477 R Axis:   69 Text Interpretation:  Atrial fibrillation Non-specific ST-t changes No significant change since last tracing Confirmed by POLLINA  MD, CHRISTOPHER [phone]) on 07/29/2015  11:00:18 PM      MDM: Brenda Cunningham is a 63 y.o. female with H&P as above who p/w CC: Patient arrives as code stroke. On arrival, patient initially protecting her airway with obvious right-sided flaccid paralysis, forced leftward gaze, and patient was taken immediately to [place]. While I scanner patient began having sonorous respirations and decision to intubate for airway protection was made. Patient was intubated as above.  Neurology states patient not TPA candidate secondary to Xarelto use and they discussed case with interventional radiologist at first I feel except the patient in transfer. Propofol gtt started for sedation.  Old records reviewed (if available). Labs and imaging reviewed personally by myself and considered in medical decision making if ordered.  CRITICAL CARE Performed by: Kirstie Peri  Total critical care time: 36  Critical care time was exclusive of separately billable  procedures and treating other patients.  Critical care was necessary to treat or prevent imminent or life-threatening deterioration.  Critical care was time spent personally by me on the following activities: development of treatment plan with patient and/or surrogate as well as nursing, discussions with consultants, evaluation of patient's response to treatment, examination of patient, obtaining history from patient or surrogate, ordering and performing treatments and interventions, ordering and review of laboratory studies, ordering and review of radiographic studies, pulse oximetry and re-evaluation of patient's condition.   Clinical Impression: 1. Stroke    Disposition: Transfer  Condition: guarded; stablized  Pt seen in conjunction with Dr. Georgie Chard, [facility] Resident - PGY-3     Kirstie Peri, MD 07/30/15 5789  Orpah Greek, MD 07/30/15 1525

## 2015-07-29 NOTE — ED Notes (Signed)
Patient arrived via EMS from home with right arm and leg flaccid and left sided gaze last seen normal 1800

## 2015-07-29 NOTE — Code Documentation (Signed)
RT, EDP, and resident at bedside for intubation

## 2015-07-29 NOTE — Telephone Encounter (Signed)
Spoke with pt & she will call back and schedule appt

## 2015-07-30 DIAGNOSIS — Z885 Allergy status to narcotic agent status: Secondary | ICD-10-CM | POA: Diagnosis not present

## 2015-07-30 DIAGNOSIS — I69320 Aphasia following cerebral infarction: Secondary | ICD-10-CM | POA: Diagnosis not present

## 2015-07-30 DIAGNOSIS — D649 Anemia, unspecified: Secondary | ICD-10-CM | POA: Diagnosis not present

## 2015-07-30 DIAGNOSIS — I482 Chronic atrial fibrillation: Secondary | ICD-10-CM | POA: Diagnosis not present

## 2015-07-30 DIAGNOSIS — J15211 Pneumonia due to Methicillin susceptible Staphylococcus aureus: Secondary | ICD-10-CM | POA: Diagnosis not present

## 2015-07-30 DIAGNOSIS — J9690 Respiratory failure, unspecified, unspecified whether with hypoxia or hypercapnia: Secondary | ICD-10-CM | POA: Diagnosis not present

## 2015-07-30 DIAGNOSIS — I4891 Unspecified atrial fibrillation: Secondary | ICD-10-CM | POA: Diagnosis not present

## 2015-07-30 DIAGNOSIS — I63412 Cerebral infarction due to embolism of left middle cerebral artery: Secondary | ICD-10-CM | POA: Diagnosis not present

## 2015-07-30 DIAGNOSIS — R918 Other nonspecific abnormal finding of lung field: Secondary | ICD-10-CM | POA: Diagnosis not present

## 2015-07-30 DIAGNOSIS — Z7901 Long term (current) use of anticoagulants: Secondary | ICD-10-CM | POA: Diagnosis not present

## 2015-07-30 DIAGNOSIS — I639 Cerebral infarction, unspecified: Secondary | ICD-10-CM | POA: Diagnosis not present

## 2015-07-30 DIAGNOSIS — I10 Essential (primary) hypertension: Secondary | ICD-10-CM | POA: Diagnosis not present

## 2015-07-30 DIAGNOSIS — Z7982 Long term (current) use of aspirin: Secondary | ICD-10-CM | POA: Diagnosis not present

## 2015-07-30 DIAGNOSIS — J479 Bronchiectasis, uncomplicated: Secondary | ICD-10-CM | POA: Diagnosis not present

## 2015-07-30 DIAGNOSIS — J9600 Acute respiratory failure, unspecified whether with hypoxia or hypercapnia: Secondary | ICD-10-CM | POA: Diagnosis not present

## 2015-07-30 DIAGNOSIS — J811 Chronic pulmonary edema: Secondary | ICD-10-CM | POA: Diagnosis not present

## 2015-07-30 DIAGNOSIS — Z4682 Encounter for fitting and adjustment of non-vascular catheter: Secondary | ICD-10-CM | POA: Diagnosis not present

## 2015-07-30 DIAGNOSIS — F329 Major depressive disorder, single episode, unspecified: Secondary | ICD-10-CM | POA: Diagnosis not present

## 2015-07-30 DIAGNOSIS — Z853 Personal history of malignant neoplasm of breast: Secondary | ICD-10-CM | POA: Diagnosis not present

## 2015-07-30 DIAGNOSIS — R4701 Aphasia: Secondary | ICD-10-CM | POA: Diagnosis not present

## 2015-07-30 DIAGNOSIS — E876 Hypokalemia: Secondary | ICD-10-CM | POA: Diagnosis present

## 2015-07-30 DIAGNOSIS — J929 Pleural plaque without asbestos: Secondary | ICD-10-CM | POA: Diagnosis not present

## 2015-07-30 DIAGNOSIS — I081 Rheumatic disorders of both mitral and tricuspid valves: Secondary | ICD-10-CM | POA: Diagnosis not present

## 2015-07-30 DIAGNOSIS — G8101 Flaccid hemiplegia affecting right dominant side: Secondary | ICD-10-CM | POA: Diagnosis not present

## 2015-07-30 DIAGNOSIS — I6602 Occlusion and stenosis of left middle cerebral artery: Secondary | ICD-10-CM | POA: Diagnosis not present

## 2015-07-30 DIAGNOSIS — I63512 Cerebral infarction due to unspecified occlusion or stenosis of left middle cerebral artery: Secondary | ICD-10-CM | POA: Diagnosis not present

## 2015-07-30 DIAGNOSIS — R1312 Dysphagia, oropharyngeal phase: Secondary | ICD-10-CM | POA: Diagnosis not present

## 2015-07-30 DIAGNOSIS — I272 Other secondary pulmonary hypertension: Secondary | ICD-10-CM | POA: Diagnosis not present

## 2015-07-30 DIAGNOSIS — I69951 Hemiplegia and hemiparesis following unspecified cerebrovascular disease affecting right dominant side: Secondary | ICD-10-CM | POA: Diagnosis not present

## 2015-07-30 DIAGNOSIS — Z8659 Personal history of other mental and behavioral disorders: Secondary | ICD-10-CM | POA: Diagnosis not present

## 2015-07-30 DIAGNOSIS — Z8639 Personal history of other endocrine, nutritional and metabolic disease: Secondary | ICD-10-CM | POA: Diagnosis not present

## 2015-07-30 LAB — URINALYSIS, ROUTINE W REFLEX MICROSCOPIC
Bilirubin Urine: NEGATIVE
Glucose, UA: NEGATIVE mg/dL
Ketones, ur: NEGATIVE mg/dL
Leukocytes, UA: NEGATIVE
Nitrite: NEGATIVE
Protein, ur: 100 mg/dL — AB
Specific Gravity, Urine: 1.013 (ref 1.005–1.030)
Urobilinogen, UA: 0.2 mg/dL (ref 0.0–1.0)
pH: 6 (ref 5.0–8.0)

## 2015-07-30 LAB — RAPID URINE DRUG SCREEN, HOSP PERFORMED
Amphetamines: NOT DETECTED
Barbiturates: NOT DETECTED
Benzodiazepines: NOT DETECTED
Cocaine: NOT DETECTED
Opiates: NOT DETECTED
Tetrahydrocannabinol: NOT DETECTED

## 2015-07-30 LAB — URINE MICROSCOPIC-ADD ON

## 2015-07-30 LAB — CBG MONITORING, ED: Glucose-Capillary: 127 mg/dL — ABNORMAL HIGH (ref 65–99)

## 2015-07-30 NOTE — ED Notes (Signed)
Report given to Flonnie Overman, RN

## 2015-07-30 NOTE — ED Notes (Signed)
Called daughter Kenney Houseman and will get patients meds and yellow earrings (placed inside of bill container)

## 2015-07-30 NOTE — Progress Notes (Signed)
Chaplain was already in ED and was asked to support family of Code Stroke pt who was brought from home to Trauma [facility]. I brought family from ED waiting room to consult room B. Doctor updated family on pt's serious situation and that she was beyond the time window for TPA. Chaplain provided emotional support for pt's significant other. Some family went outside, but chaplain regathered them in consult room when doctor requested to meet with them again. He recommended to family that pt be sent to [facility] for immediate treatment there, and family agreed. I brought pt's significant other and pt's two daughters to bedside to be with pt until [facility] arrived to take pt to [facility].

## 2015-08-01 ENCOUNTER — Telehealth: Payer: Self-pay | Admitting: Medical

## 2015-08-01 NOTE — Telephone Encounter (Signed)
Patient's daughter called to make sure you were aware that patient is in the hospital [facility] in [facility] She had massive stroke She was at [facility] for a very short time and then sent to [facility]

## 2015-08-02 NOTE — Telephone Encounter (Signed)
Please tell her that I appreciate her calling, and I was sad to see this happen.  I did get notification that she was sent to [facility] for this.  I don't have access to [facility]'s records though.  I pray that she will improve every day.  She and her family are in my prayers.

## 2015-08-03 DIAGNOSIS — I69320 Aphasia following cerebral infarction: Secondary | ICD-10-CM | POA: Insufficient documentation

## 2015-08-05 DIAGNOSIS — E780 Pure hypercholesterolemia: Secondary | ICD-10-CM | POA: Diagnosis not present

## 2015-08-05 DIAGNOSIS — N390 Urinary tract infection, site not specified: Secondary | ICD-10-CM | POA: Diagnosis not present

## 2015-08-05 DIAGNOSIS — I4891 Unspecified atrial fibrillation: Secondary | ICD-10-CM | POA: Diagnosis not present

## 2015-08-05 DIAGNOSIS — I63512 Cerebral infarction due to unspecified occlusion or stenosis of left middle cerebral artery: Secondary | ICD-10-CM | POA: Diagnosis not present

## 2015-08-05 DIAGNOSIS — Z79899 Other long term (current) drug therapy: Secondary | ICD-10-CM | POA: Diagnosis not present

## 2015-08-05 DIAGNOSIS — I69354 Hemiplegia and hemiparesis following cerebral infarction affecting left non-dominant side: Secondary | ICD-10-CM | POA: Diagnosis not present

## 2015-08-05 DIAGNOSIS — Z8673 Personal history of transient ischemic attack (TIA), and cerebral infarction without residual deficits: Secondary | ICD-10-CM | POA: Insufficient documentation

## 2015-08-05 DIAGNOSIS — I69320 Aphasia following cerebral infarction: Secondary | ICD-10-CM | POA: Diagnosis not present

## 2015-08-05 DIAGNOSIS — F329 Major depressive disorder, single episode, unspecified: Secondary | ICD-10-CM | POA: Diagnosis not present

## 2015-08-05 DIAGNOSIS — R2689 Other abnormalities of gait and mobility: Secondary | ICD-10-CM | POA: Diagnosis not present

## 2015-08-05 DIAGNOSIS — I10 Essential (primary) hypertension: Secondary | ICD-10-CM | POA: Diagnosis not present

## 2015-08-05 DIAGNOSIS — J15211 Pneumonia due to Methicillin susceptible Staphylococcus aureus: Secondary | ICD-10-CM | POA: Diagnosis not present

## 2015-08-06 DIAGNOSIS — I482 Chronic atrial fibrillation, unspecified: Secondary | ICD-10-CM | POA: Insufficient documentation

## 2015-08-18 DIAGNOSIS — I69320 Aphasia following cerebral infarction: Secondary | ICD-10-CM | POA: Diagnosis not present

## 2015-08-18 DIAGNOSIS — F329 Major depressive disorder, single episode, unspecified: Secondary | ICD-10-CM | POA: Diagnosis not present

## 2015-08-18 DIAGNOSIS — I69390 Apraxia following cerebral infarction: Secondary | ICD-10-CM | POA: Diagnosis not present

## 2015-08-18 DIAGNOSIS — E669 Obesity, unspecified: Secondary | ICD-10-CM | POA: Diagnosis not present

## 2015-08-18 DIAGNOSIS — Z7982 Long term (current) use of aspirin: Secondary | ICD-10-CM | POA: Diagnosis not present

## 2015-08-18 DIAGNOSIS — I4891 Unspecified atrial fibrillation: Secondary | ICD-10-CM | POA: Diagnosis not present

## 2015-08-18 DIAGNOSIS — Z7901 Long term (current) use of anticoagulants: Secondary | ICD-10-CM | POA: Diagnosis not present

## 2015-08-18 DIAGNOSIS — I10 Essential (primary) hypertension: Secondary | ICD-10-CM | POA: Diagnosis not present

## 2015-08-18 DIAGNOSIS — I69333 Monoplegia of upper limb following cerebral infarction affecting right non-dominant side: Secondary | ICD-10-CM | POA: Diagnosis not present

## 2015-08-18 DIAGNOSIS — Z8701 Personal history of pneumonia (recurrent): Secondary | ICD-10-CM | POA: Diagnosis not present

## 2015-08-19 ENCOUNTER — Telehealth: Payer: Self-pay | Admitting: Medical

## 2015-08-19 DIAGNOSIS — I69320 Aphasia following cerebral infarction: Secondary | ICD-10-CM | POA: Diagnosis not present

## 2015-08-19 DIAGNOSIS — I10 Essential (primary) hypertension: Secondary | ICD-10-CM | POA: Diagnosis not present

## 2015-08-19 DIAGNOSIS — F329 Major depressive disorder, single episode, unspecified: Secondary | ICD-10-CM | POA: Diagnosis not present

## 2015-08-19 DIAGNOSIS — I4891 Unspecified atrial fibrillation: Secondary | ICD-10-CM | POA: Diagnosis not present

## 2015-08-19 DIAGNOSIS — I69333 Monoplegia of upper limb following cerebral infarction affecting right non-dominant side: Secondary | ICD-10-CM | POA: Diagnosis not present

## 2015-08-19 DIAGNOSIS — I69390 Apraxia following cerebral infarction: Secondary | ICD-10-CM | POA: Diagnosis not present

## 2015-08-19 NOTE — Telephone Encounter (Signed)
This patient was recently hospitalized.  She had a major stroke.    Please call patient/family about hospital continuity of care.    See how they are doing. Review any medication changes per discharge summary. Schedule follow up appointment and make note on schedule that this is hospital f/u continuity of care visit.

## 2015-08-20 DIAGNOSIS — I4891 Unspecified atrial fibrillation: Secondary | ICD-10-CM | POA: Diagnosis not present

## 2015-08-20 DIAGNOSIS — I69320 Aphasia following cerebral infarction: Secondary | ICD-10-CM | POA: Diagnosis not present

## 2015-08-20 DIAGNOSIS — I69333 Monoplegia of upper limb following cerebral infarction affecting right non-dominant side: Secondary | ICD-10-CM | POA: Diagnosis not present

## 2015-08-20 DIAGNOSIS — I69390 Apraxia following cerebral infarction: Secondary | ICD-10-CM | POA: Diagnosis not present

## 2015-08-20 DIAGNOSIS — F329 Major depressive disorder, single episode, unspecified: Secondary | ICD-10-CM | POA: Diagnosis not present

## 2015-08-20 DIAGNOSIS — I10 Essential (primary) hypertension: Secondary | ICD-10-CM | POA: Diagnosis not present

## 2015-08-20 NOTE — Telephone Encounter (Signed)
Called pt's number and spoke to her spouse who along with daughter is care provider. He states that pt was released at [facility] in [place] on Saturday. He states that pt had a massive stroke but is doing ok. He states her meds all stayed the same except for the blood thinner. He states that he understands how all her medications are to be given, has then all filled except for a few to pick up at the pharmacy today and she is not having any issues with them. He states that she has had home health come out once and they are scheduled to return today, we spoke briefly about the "magic hour"  And he stated they explained to him at the hospital how important it is she get to the hospital ASAP if she experiencing symptoms again. Pt's spouse was also informed of upcoming appt on 08/30/2015 and that he needed to bring all her medications to that visit. He verbalized understanding. I gave him the office phone number and told him to call if he had any issues arise between now and appt time.

## 2015-08-21 DIAGNOSIS — F329 Major depressive disorder, single episode, unspecified: Secondary | ICD-10-CM | POA: Diagnosis not present

## 2015-08-21 DIAGNOSIS — I69390 Apraxia following cerebral infarction: Secondary | ICD-10-CM | POA: Diagnosis not present

## 2015-08-21 DIAGNOSIS — I4891 Unspecified atrial fibrillation: Secondary | ICD-10-CM | POA: Diagnosis not present

## 2015-08-21 DIAGNOSIS — I69333 Monoplegia of upper limb following cerebral infarction affecting right non-dominant side: Secondary | ICD-10-CM | POA: Diagnosis not present

## 2015-08-21 DIAGNOSIS — I69320 Aphasia following cerebral infarction: Secondary | ICD-10-CM | POA: Diagnosis not present

## 2015-08-21 DIAGNOSIS — I10 Essential (primary) hypertension: Secondary | ICD-10-CM | POA: Diagnosis not present

## 2015-08-22 DIAGNOSIS — I10 Essential (primary) hypertension: Secondary | ICD-10-CM | POA: Diagnosis not present

## 2015-08-22 DIAGNOSIS — I69390 Apraxia following cerebral infarction: Secondary | ICD-10-CM | POA: Diagnosis not present

## 2015-08-22 DIAGNOSIS — I69320 Aphasia following cerebral infarction: Secondary | ICD-10-CM | POA: Diagnosis not present

## 2015-08-22 DIAGNOSIS — I4891 Unspecified atrial fibrillation: Secondary | ICD-10-CM | POA: Diagnosis not present

## 2015-08-22 DIAGNOSIS — I69333 Monoplegia of upper limb following cerebral infarction affecting right non-dominant side: Secondary | ICD-10-CM | POA: Diagnosis not present

## 2015-08-22 DIAGNOSIS — F329 Major depressive disorder, single episode, unspecified: Secondary | ICD-10-CM | POA: Diagnosis not present

## 2015-08-23 DIAGNOSIS — I69320 Aphasia following cerebral infarction: Secondary | ICD-10-CM | POA: Diagnosis not present

## 2015-08-23 DIAGNOSIS — I10 Essential (primary) hypertension: Secondary | ICD-10-CM | POA: Diagnosis not present

## 2015-08-23 DIAGNOSIS — I69390 Apraxia following cerebral infarction: Secondary | ICD-10-CM | POA: Diagnosis not present

## 2015-08-23 DIAGNOSIS — I69333 Monoplegia of upper limb following cerebral infarction affecting right non-dominant side: Secondary | ICD-10-CM | POA: Diagnosis not present

## 2015-08-23 DIAGNOSIS — I4891 Unspecified atrial fibrillation: Secondary | ICD-10-CM | POA: Diagnosis not present

## 2015-08-23 DIAGNOSIS — F329 Major depressive disorder, single episode, unspecified: Secondary | ICD-10-CM | POA: Diagnosis not present

## 2015-08-26 DIAGNOSIS — I69390 Apraxia following cerebral infarction: Secondary | ICD-10-CM | POA: Diagnosis not present

## 2015-08-26 DIAGNOSIS — I69333 Monoplegia of upper limb following cerebral infarction affecting right non-dominant side: Secondary | ICD-10-CM | POA: Diagnosis not present

## 2015-08-26 DIAGNOSIS — I69320 Aphasia following cerebral infarction: Secondary | ICD-10-CM | POA: Diagnosis not present

## 2015-08-26 DIAGNOSIS — F329 Major depressive disorder, single episode, unspecified: Secondary | ICD-10-CM | POA: Diagnosis not present

## 2015-08-26 DIAGNOSIS — I10 Essential (primary) hypertension: Secondary | ICD-10-CM | POA: Diagnosis not present

## 2015-08-26 DIAGNOSIS — I4891 Unspecified atrial fibrillation: Secondary | ICD-10-CM | POA: Diagnosis not present

## 2015-08-27 DIAGNOSIS — I10 Essential (primary) hypertension: Secondary | ICD-10-CM | POA: Diagnosis not present

## 2015-08-27 DIAGNOSIS — I69390 Apraxia following cerebral infarction: Secondary | ICD-10-CM | POA: Diagnosis not present

## 2015-08-27 DIAGNOSIS — I4891 Unspecified atrial fibrillation: Secondary | ICD-10-CM | POA: Diagnosis not present

## 2015-08-27 DIAGNOSIS — I69333 Monoplegia of upper limb following cerebral infarction affecting right non-dominant side: Secondary | ICD-10-CM | POA: Diagnosis not present

## 2015-08-27 DIAGNOSIS — I69320 Aphasia following cerebral infarction: Secondary | ICD-10-CM | POA: Diagnosis not present

## 2015-08-27 DIAGNOSIS — F329 Major depressive disorder, single episode, unspecified: Secondary | ICD-10-CM | POA: Diagnosis not present

## 2015-08-28 DIAGNOSIS — F329 Major depressive disorder, single episode, unspecified: Secondary | ICD-10-CM | POA: Diagnosis not present

## 2015-08-28 DIAGNOSIS — I69320 Aphasia following cerebral infarction: Secondary | ICD-10-CM | POA: Diagnosis not present

## 2015-08-28 DIAGNOSIS — I4891 Unspecified atrial fibrillation: Secondary | ICD-10-CM | POA: Diagnosis not present

## 2015-08-28 DIAGNOSIS — I69333 Monoplegia of upper limb following cerebral infarction affecting right non-dominant side: Secondary | ICD-10-CM | POA: Diagnosis not present

## 2015-08-28 DIAGNOSIS — I10 Essential (primary) hypertension: Secondary | ICD-10-CM | POA: Diagnosis not present

## 2015-08-28 DIAGNOSIS — I69390 Apraxia following cerebral infarction: Secondary | ICD-10-CM | POA: Diagnosis not present

## 2015-08-29 DIAGNOSIS — Z09 Encounter for follow-up examination after completed treatment for conditions other than malignant neoplasm: Secondary | ICD-10-CM | POA: Diagnosis not present

## 2015-08-29 DIAGNOSIS — I69390 Apraxia following cerebral infarction: Secondary | ICD-10-CM | POA: Diagnosis not present

## 2015-08-29 DIAGNOSIS — F329 Major depressive disorder, single episode, unspecified: Secondary | ICD-10-CM | POA: Diagnosis not present

## 2015-08-29 DIAGNOSIS — I4891 Unspecified atrial fibrillation: Secondary | ICD-10-CM | POA: Diagnosis not present

## 2015-08-29 DIAGNOSIS — I69333 Monoplegia of upper limb following cerebral infarction affecting right non-dominant side: Secondary | ICD-10-CM | POA: Diagnosis not present

## 2015-08-29 DIAGNOSIS — I69328 Other speech and language deficits following cerebral infarction: Secondary | ICD-10-CM | POA: Diagnosis not present

## 2015-08-29 DIAGNOSIS — Z8673 Personal history of transient ischemic attack (TIA), and cerebral infarction without residual deficits: Secondary | ICD-10-CM | POA: Diagnosis not present

## 2015-08-29 DIAGNOSIS — I10 Essential (primary) hypertension: Secondary | ICD-10-CM | POA: Diagnosis not present

## 2015-08-29 DIAGNOSIS — I69320 Aphasia following cerebral infarction: Secondary | ICD-10-CM | POA: Diagnosis not present

## 2015-08-30 ENCOUNTER — Inpatient Hospital Stay: Payer: Self-pay | Admitting: Medical

## 2015-08-30 DIAGNOSIS — I10 Essential (primary) hypertension: Secondary | ICD-10-CM | POA: Diagnosis not present

## 2015-08-30 DIAGNOSIS — I69320 Aphasia following cerebral infarction: Secondary | ICD-10-CM | POA: Diagnosis not present

## 2015-08-30 DIAGNOSIS — I63512 Cerebral infarction due to unspecified occlusion or stenosis of left middle cerebral artery: Secondary | ICD-10-CM | POA: Diagnosis not present

## 2015-08-30 DIAGNOSIS — I482 Chronic atrial fibrillation: Secondary | ICD-10-CM | POA: Diagnosis not present

## 2015-09-02 DIAGNOSIS — I4891 Unspecified atrial fibrillation: Secondary | ICD-10-CM | POA: Diagnosis not present

## 2015-09-02 DIAGNOSIS — I69390 Apraxia following cerebral infarction: Secondary | ICD-10-CM | POA: Diagnosis not present

## 2015-09-02 DIAGNOSIS — I10 Essential (primary) hypertension: Secondary | ICD-10-CM | POA: Diagnosis not present

## 2015-09-02 DIAGNOSIS — I69320 Aphasia following cerebral infarction: Secondary | ICD-10-CM | POA: Diagnosis not present

## 2015-09-02 DIAGNOSIS — F329 Major depressive disorder, single episode, unspecified: Secondary | ICD-10-CM | POA: Diagnosis not present

## 2015-09-02 DIAGNOSIS — I69333 Monoplegia of upper limb following cerebral infarction affecting right non-dominant side: Secondary | ICD-10-CM | POA: Diagnosis not present

## 2015-09-03 ENCOUNTER — Other Ambulatory Visit: Payer: Self-pay

## 2015-09-03 ENCOUNTER — Telehealth: Payer: Self-pay | Admitting: Medical

## 2015-09-03 DIAGNOSIS — I69390 Apraxia following cerebral infarction: Secondary | ICD-10-CM | POA: Diagnosis not present

## 2015-09-03 DIAGNOSIS — F329 Major depressive disorder, single episode, unspecified: Secondary | ICD-10-CM | POA: Diagnosis not present

## 2015-09-03 DIAGNOSIS — I69333 Monoplegia of upper limb following cerebral infarction affecting right non-dominant side: Secondary | ICD-10-CM | POA: Diagnosis not present

## 2015-09-03 DIAGNOSIS — I69320 Aphasia following cerebral infarction: Secondary | ICD-10-CM | POA: Diagnosis not present

## 2015-09-03 DIAGNOSIS — I10 Essential (primary) hypertension: Secondary | ICD-10-CM | POA: Diagnosis not present

## 2015-09-03 DIAGNOSIS — I4891 Unspecified atrial fibrillation: Secondary | ICD-10-CM | POA: Diagnosis not present

## 2015-09-03 NOTE — Telephone Encounter (Signed)
DR.LALONDE I TRIED TO CALL PT TWICE BOTH TIMES THE PERSON HUNG UP ON ME

## 2015-09-03 NOTE — Telephone Encounter (Signed)
The chart says she is on [place]. Check this out

## 2015-09-03 NOTE — Telephone Encounter (Signed)
Recvd refill request for Eliquis 5 mg #60

## 2015-09-04 DIAGNOSIS — I4891 Unspecified atrial fibrillation: Secondary | ICD-10-CM | POA: Diagnosis not present

## 2015-09-04 DIAGNOSIS — I69320 Aphasia following cerebral infarction: Secondary | ICD-10-CM | POA: Diagnosis not present

## 2015-09-04 DIAGNOSIS — I69333 Monoplegia of upper limb following cerebral infarction affecting right non-dominant side: Secondary | ICD-10-CM | POA: Diagnosis not present

## 2015-09-04 DIAGNOSIS — I69390 Apraxia following cerebral infarction: Secondary | ICD-10-CM | POA: Diagnosis not present

## 2015-09-04 DIAGNOSIS — I10 Essential (primary) hypertension: Secondary | ICD-10-CM | POA: Diagnosis not present

## 2015-09-04 DIAGNOSIS — F329 Major depressive disorder, single episode, unspecified: Secondary | ICD-10-CM | POA: Diagnosis not present

## 2015-09-05 DIAGNOSIS — I69333 Monoplegia of upper limb following cerebral infarction affecting right non-dominant side: Secondary | ICD-10-CM | POA: Diagnosis not present

## 2015-09-05 DIAGNOSIS — I4891 Unspecified atrial fibrillation: Secondary | ICD-10-CM | POA: Diagnosis not present

## 2015-09-05 DIAGNOSIS — F329 Major depressive disorder, single episode, unspecified: Secondary | ICD-10-CM | POA: Diagnosis not present

## 2015-09-05 DIAGNOSIS — I69320 Aphasia following cerebral infarction: Secondary | ICD-10-CM | POA: Diagnosis not present

## 2015-09-05 DIAGNOSIS — I69390 Apraxia following cerebral infarction: Secondary | ICD-10-CM | POA: Diagnosis not present

## 2015-09-05 DIAGNOSIS — I10 Essential (primary) hypertension: Secondary | ICD-10-CM | POA: Diagnosis not present

## 2015-09-10 ENCOUNTER — Other Ambulatory Visit: Payer: Self-pay

## 2015-09-10 DIAGNOSIS — I69390 Apraxia following cerebral infarction: Secondary | ICD-10-CM | POA: Diagnosis not present

## 2015-09-10 DIAGNOSIS — I4891 Unspecified atrial fibrillation: Secondary | ICD-10-CM | POA: Diagnosis not present

## 2015-09-10 DIAGNOSIS — F329 Major depressive disorder, single episode, unspecified: Secondary | ICD-10-CM | POA: Diagnosis not present

## 2015-09-10 DIAGNOSIS — I69333 Monoplegia of upper limb following cerebral infarction affecting right non-dominant side: Secondary | ICD-10-CM | POA: Diagnosis not present

## 2015-09-10 DIAGNOSIS — I69320 Aphasia following cerebral infarction: Secondary | ICD-10-CM | POA: Diagnosis not present

## 2015-09-10 DIAGNOSIS — I10 Essential (primary) hypertension: Secondary | ICD-10-CM | POA: Diagnosis not present

## 2015-09-10 DIAGNOSIS — Z1231 Encounter for screening mammogram for malignant neoplasm of breast: Secondary | ICD-10-CM

## 2015-09-11 ENCOUNTER — Other Ambulatory Visit: Payer: Self-pay | Admitting: Medical

## 2015-09-11 ENCOUNTER — Telehealth: Payer: Self-pay

## 2015-09-11 ENCOUNTER — Other Ambulatory Visit: Payer: Self-pay

## 2015-09-11 MED ORDER — APIXABAN 5 MG PO TABS: 5.0000 mg | ORAL_TABLET | Freq: Two times a day (BID) | ORAL | Status: AC

## 2015-09-11 NOTE — Telephone Encounter (Signed)
Med sent, but she was on schedule for hospital f/u and either no showed or canceled.  Still needs to come in for hospital f/u

## 2015-09-11 NOTE — Telephone Encounter (Signed)
Refill request for Eliquis 5mg  #60

## 2015-09-12 DIAGNOSIS — I69333 Monoplegia of upper limb following cerebral infarction affecting right non-dominant side: Secondary | ICD-10-CM | POA: Diagnosis not present

## 2015-09-12 DIAGNOSIS — I4891 Unspecified atrial fibrillation: Secondary | ICD-10-CM | POA: Diagnosis not present

## 2015-09-12 DIAGNOSIS — F329 Major depressive disorder, single episode, unspecified: Secondary | ICD-10-CM | POA: Diagnosis not present

## 2015-09-12 DIAGNOSIS — I69320 Aphasia following cerebral infarction: Secondary | ICD-10-CM | POA: Diagnosis not present

## 2015-09-12 DIAGNOSIS — I69390 Apraxia following cerebral infarction: Secondary | ICD-10-CM | POA: Diagnosis not present

## 2015-09-12 DIAGNOSIS — I10 Essential (primary) hypertension: Secondary | ICD-10-CM | POA: Diagnosis not present

## 2015-09-13 ENCOUNTER — Telehealth: Payer: Self-pay | Admitting: Medical

## 2015-09-13 NOTE — Telephone Encounter (Signed)
Left message for pt to call. Audelia Acton would like to see her.

## 2015-09-16 ENCOUNTER — Encounter: Payer: Self-pay | Admitting: Medical

## 2015-09-17 ENCOUNTER — Ambulatory Visit: Payer: Medicare Other | Attending: Family Medicine | Admitting: Occupational Therapy

## 2015-09-17 ENCOUNTER — Encounter: Payer: Self-pay | Admitting: Occupational Therapy

## 2015-09-17 DIAGNOSIS — I639 Cerebral infarction, unspecified: Secondary | ICD-10-CM | POA: Insufficient documentation

## 2015-09-17 DIAGNOSIS — R2681 Unsteadiness on feet: Secondary | ICD-10-CM | POA: Diagnosis not present

## 2015-09-17 DIAGNOSIS — H539 Unspecified visual disturbance: Secondary | ICD-10-CM

## 2015-09-17 DIAGNOSIS — I69359 Hemiplegia and hemiparesis following cerebral infarction affecting unspecified side: Secondary | ICD-10-CM | POA: Diagnosis not present

## 2015-09-17 DIAGNOSIS — R4189 Other symptoms and signs involving cognitive functions and awareness: Secondary | ICD-10-CM | POA: Diagnosis not present

## 2015-09-17 DIAGNOSIS — R4701 Aphasia: Secondary | ICD-10-CM | POA: Diagnosis not present

## 2015-09-17 DIAGNOSIS — G8110 Spastic hemiplegia affecting unspecified side: Secondary | ICD-10-CM | POA: Diagnosis not present

## 2015-09-17 DIAGNOSIS — R449 Unspecified symptoms and signs involving general sensations and perceptions: Secondary | ICD-10-CM

## 2015-09-17 NOTE — Therapy (Signed)
[facility] [address] [place], [place], [address] Phone: [phone]   Fax:  [phone]  Occupational Therapy Evaluation  Patient Details  Name: Brenda Cunningham MRN: [id number] Date of Birth: 1951-01-15 Referring Provider:  Carlena Hurl, PA-C  Encounter Date: 09/17/2015      OT End of Session - 09/17/15 1728    Visit Number 1   Number of Visits 25   Date for OT Re-Evaluation 11/15/15   Authorization Type Medicare   OT Start Time 1530   OT Stop Time 1620   OT Time Calculation (min) 50 min   Activity Tolerance Patient tolerated treatment well   Behavior During Therapy [facility] for tasks assessed/performed  very engaged      Past Medical History  Diagnosis Date  . Hypertension   . Arthritis   . A-fib ([place])   . Hyperlipidemia   . Stroke ([place]) 2010  . Breast cancer ([place]) 2008    s/p lumpectomy, chemo and radiation therapy  . Wears glasses   . Anxiety     Past Surgical History  Procedure Laterality Date  . Breast lumpectomy  2008    left  . Appendectomy    . Tubal ligation    . Colonoscopy  2004    There were no vitals filed for this visit.  Visit Diagnosis:  Hemiparesis affecting nondominant side as late effect of cerebrovascular accident [facility]) - Plan: Ot plan of care cert/re-cert  Spastic hemiplegia affecting nondominant side ([place]) - Plan: Ot plan of care cert/re-cert  Sensory deficit, right - Plan: Ot plan of care cert/re-cert  Unsteadiness - Plan: Ot plan of care cert/re-cert  Visual disturbance - Plan: Ot plan of care cert/re-cert      Subjective Assessment - 09/17/15 1546    Subjective  my right hand           [facility] OT Assessment - 09/17/15 0001    Assessment   Diagnosis left middle cerebral artery stroke   Onset Date 07/29/15   Prior Therapy IP rehab, then [facility] therapy until week of 09/09/15   Precautions   Precautions Other (comment)   Precaution Comments aphasia    Restrictions   Weight Bearing Restrictions No   Balance Screen   Has the patient fallen in the past 6 months No   Has the patient had a decrease in activity level because of a fear of falling?  No   Is the patient reluctant to leave their home because of a fear of falling?  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, and household chores   Leisure sit on [employer], love to be outside, read, TV,    ADL   Eating/Feeding Minimal assistance   Eating/Feeding Patient Percentage 90%   Grooming Minimal assistance   Grooming Patient Percentage 90%   Upper Body Bathing Modified independent   Lower Body Bathing Supervision/safety   Upper Body Dressing Minimal assistance   Lower Body Dressing Minimal assistance   Toilet Tranfer Modified independent   [place] Raised toilet seat with arms (or 3-in-1over toilet)   Toileting - Systems developer Details (indicate cue type and reason sits on shower bench and swings legs into tub   Tub/Shower  Transfer Equipment Transfer tub bench  hand held shower   ADL comments Patient needs assistance for all two handed tasks; hooking bra, tying shoes, buttoning pants, donning earrings, etc   IADL   Prior Level of Function Shopping independent   Shopping Assistance for transportation;Needs to be accompanied on any shopping trip   Prior Level of Function Light Housekeeping independent   Light Housekeeping Performs light daily tasks but cannot maintain acceptable level of cleanliness   Prior Level of Function Meal Prep independent   Meal Prep Does not utilize stove or oven;Needs to have meals prepared and served   Prior Level of Function Presenter, 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 for transportation   Medication Management Is not capable of dispensing or managing own medication   Mobility   Mobility Status Independent   Mobility Status Comments walks without device   Written Expression   Dominant Hand Left   Handwriting 100% legible  although aphasic - so content limited, not coordination issu   Written Experience Exceptions to [facility]   Effective Techniques Verbal/Language cues;Other (comment)  limited by language deficit   Vision - History   Baseline Vision Bifocals   Patient Visual Report Overshooting   Additional Comments due for new prescription of eyeglasses   Vision Assessment   Eye Alignment Impaired (comment)   Ocular Range of Motion Restricted on left   Alignment/Gaze Preference Other (comment)   Tracking/Visual Pursuits Decreased smoothness of horizontal tracking   Saccades Overshoots   Comment right eye with slight medial orientation   Activity Tolerance   Sitting Balance Moves/returns trunkal midpoint > 2 inches in all planes   Cognition   Overall Cognitive Status Within Functional Limits for tasks assessed   Attention Selective   Selective Attention Appears intact   Memory Appears intact   Awareness Appears intact   Executive Function Self Monitoring  does not recognize language errors - able to inflect voice   Self Monitoring Impaired   Self Monitoring Impairment Verbal basic   Observation/Other Assessments   Focus on Therapeutic Outcomes (FOTO)  Stroke IS Physical Hand Function 5%   Sensation   Light Touch Impaired by gross assessment   Stereognosis Impaired by gross assessment   Proprioception Impaired by gross assessment   Coordination   Gross Motor Movements are Fluid and Coordinated No   Fine Motor Movements are Fluid and Coordinated No   Coordination and Movement Description dense hemiplegia distally in left wrist and hand   Finger Nose Finger Test unable   9  Hole Peg Test Right   Right 9 Hole Peg Test unable   Box and Blocks unable-right   Perception   Perception Within Functional Limits   Praxis   Praxis Impaired   Praxis Impairment Details Ideomotor   Praxis-Other Comments difficult to ascertain if apraxia or dense hemisensory loss / or combination   ROM / Strength   AROM / PROM / Strength PROM;AROM   AROM   Overall AROM  Deficits   Overall AROM Comments active shoulder flexion to 100 degrees, active supination to neutral, no active wrist extension, finger extension or flexion   PROM   Overall PROM  Within functional limits for tasks performed   Overall PROM Comments right shoulder 0-140 degrees PROM   Hand Function   Right Hand Gross Grasp Impaired   Right Hand Grip (lbs) unable   RUE Tone   RUE Tone Moderate  finger flexors, specifically 3rd/4th digits  OT Education - 09/17/15 1725    Education provided Yes   Education Details importance of finding activites in which right UE could successfully assist dominant left hand crucial to developoing movement.  Importance of using vision to compensate for sensory loss when attempting to move right arm / hand   Person(s) Educated Patient;Spouse   Methods Explanation;Demonstration;Tactile cues;Verbal cues   Comprehension Need further instruction;Tactile cues required;Verbal cues required          OT Short Term Goals - 09/17/15 1737    OT SHORT TERM GOAL #1   Title Following facilitation,Patient will demonstrate active finger flexion through 30% available range as skill leading to pre grasp ability.     Time 4   Period Weeks   Status New   OT SHORT TERM GOAL #2   Title Patient will demonstrate active release of finger flexion to begin to release 2 inch or larger objects from right hand   Time 4   Period Weeks   Status New   OT SHORT TERM GOAL #3   Title Patient will demonstrate ability to flex shoulder to 120 degrees in preparation for  reaching into cabinet for shelf at eye level with right arm   Time 4   Period Weeks   Status New   OT SHORT TERM GOAL #4   Title Patient will feed self with modified independence   Time 4   Period Weeks   Status New   OT SHORT TERM GOAL #5   Title Patient will complete grooming with modified independence   Time 4   Period Weeks   Status New           OT Long Term Goals - 09/17/15 1749    OT LONG TERM GOAL #1   Title Patient will grasp and release small object 1-3 inches with right hand with minimal cueing / facilitation.   Time 12   Period Weeks   Status New   OT LONG TERM GOAL #2   Title Patient will reach into overhead shelf and retrieve a lightweight (1/2-1lb) object with minimal cueing / facilitation   Time 12   Period Weeks   Status New   OT LONG TERM GOAL #3   Title Patient will be able to safely don/doff splint for right hand, and show awareness of wearing schedule.   Time 12   Period Weeks   Status New   OT LONG TERM GOAL #4   Title Patient will be independent with a home exercise / home activity program   Time 12   Period Weeks   Status New   OT LONG TERM GOAL #5   Title Patient will carry light weight item e.g. laundry basket, across length of room, i.e. 20 feet.     Time 12   Period Weeks   Status New   OT LONG TERM GOAL #6   Title Patient will dress herself with modified independence               Plan - 09/17/15 1730    Clinical Impression Statement Patient is a 64 year old woman admitted to OT services following recent left middle cerebral artery stroke.  Patient received OT services iin the hospital, in inpatient rehab, and most recently from home health agency.  Patient will benefit from skilled OT Intervention to address non dominant right hemiplegia, severe hemisensory loss, decreased visual perceptual skills, and significant languange issues, all of which impede her ability to safely and independently complete  basic ADL/ IADL at this time.      Pt will benefit from skilled therapeutic intervention in order to improve on the following deficits (Retired) Decreased balance;Decreased endurance;Decreased coordination;Decreased range of motion;Decreased safety awareness;Impaired perceived functional ability;Impaired vision/preception;Impaired UE functional use;Impaired tone;Impaired sensation   Rehab Potential Good   OT Frequency 2x / week   OT Duration 12 weeks   OT Treatment/Interventions Self-care/ADL training;Electrical Stimulation;Moist Heat;Fluidtherapy;Therapeutic exercise;Neuromuscular education;DME and/or AE instruction;Manual Therapy;Functional Mobility Training;Splinting;Therapeutic activities;Visual/perceptual remediation/compensation;Patient/family education;Balance training   Plan Begin Home activity program - opening drawers, turning on lights, cleaning tabletop with right hand, weight bearing then neuro reed to right UE, splint for wrist/fingers   Recommended Other Services SLP eval Fri 10/14   Consulted and Agree with Plan of Care Patient;Family member/caregiver   Family Member Consulted signifcant other - [name]          G-Codes - 09-24-2015 1805    Functional Assessment Tool Used foto IS Physical- Hand 5%, skilled clinical observation   Functional Limitation Carrying, moving and handling objects   Carrying, Moving and Handling Objects Current Status [phone]) At least 80 percent but less than 100 percent impaired, limited or restricted   Carrying, Moving and Handling Objects Goal Status (N2778) At least 40 percent but less than 60 percent impaired, limited or restricted      Problem List Patient Active Problem List   Diagnosis Date Noted  . Essential hypertension, benign 07/18/2014  . Atrial fibrillation, unspecified 07/18/2014  . Anxiety state, unspecified 07/18/2014    Mariah Milling, OTR/L 2015/09/24, 6:28 PM  [facility] [address] [place], [place], [address] Phone: [phone]   Fax:  [phone]

## 2015-09-17 NOTE — Telephone Encounter (Signed)
Pt was sent a letter since she cannot be reached any other way

## 2015-09-20 ENCOUNTER — Ambulatory Visit: Payer: Medicare Other

## 2015-09-20 ENCOUNTER — Encounter: Payer: Self-pay | Admitting: Occupational Therapy

## 2015-09-20 ENCOUNTER — Ambulatory Visit: Payer: Medicare Other | Admitting: Occupational Therapy

## 2015-09-20 DIAGNOSIS — R2681 Unsteadiness on feet: Secondary | ICD-10-CM | POA: Diagnosis not present

## 2015-09-20 DIAGNOSIS — I639 Cerebral infarction, unspecified: Secondary | ICD-10-CM | POA: Diagnosis not present

## 2015-09-20 DIAGNOSIS — R449 Unspecified symptoms and signs involving general sensations and perceptions: Secondary | ICD-10-CM

## 2015-09-20 DIAGNOSIS — H539 Unspecified visual disturbance: Secondary | ICD-10-CM | POA: Diagnosis not present

## 2015-09-20 DIAGNOSIS — G8110 Spastic hemiplegia affecting unspecified side: Secondary | ICD-10-CM | POA: Diagnosis not present

## 2015-09-20 DIAGNOSIS — IMO0002 Reserved for concepts with insufficient information to code with codable children: Secondary | ICD-10-CM

## 2015-09-20 DIAGNOSIS — R4189 Other symptoms and signs involving cognitive functions and awareness: Secondary | ICD-10-CM

## 2015-09-20 DIAGNOSIS — I69359 Hemiplegia and hemiparesis following cerebral infarction affecting unspecified side: Secondary | ICD-10-CM

## 2015-09-20 NOTE — Patient Instructions (Signed)
Hand Splint    Today, 09/20/2015 Check skin condition after 30 minutes for changes in color, irritation or swelling. If no problems, you can wear splint as follows.  Wearing Schedule: Day Night As needed 2 hours per session. 3 sessions per day.  May want to wear after each meal. If you can tolerate it - you can wear splint all night.  Do not wear splint at night if it disrupts your sleeping.     Copyright  VHI. All rights reserved.

## 2015-09-20 NOTE — Therapy (Signed)
[facility] [address] [place], [place], [address] Phone: [phone]   Fax:  [phone]  Speech Language Pathology Evaluation  Patient Details  Name: Brenda Cunningham MRN: [id number] Date of Birth: [birth year] Referring Provider: Helane Rima  Encounter Date: 09/20/2015      End of Session - 09/20/15 1037    Visit Number 1   Number of Visits 17   Date for SLP Re-Evaluation 11/20/15   SLP Start Time 0932   SLP Stop Time  1016   SLP Time Calculation (min) 44 min   Activity Tolerance Patient tolerated treatment well      Past Medical History  Diagnosis Date  . Hypertension   . Arthritis   . A-fib ([place])   . Hyperlipidemia   . Stroke ([place]) 2010  . Breast cancer ([place]) 2008    s/p lumpectomy, chemo and radiation therapy  . Wears glasses   . Anxiety     Past Surgical History  Procedure Laterality Date  . Breast lumpectomy  2008    left  . Appendectomy    . Tubal ligation    . Colonoscopy  2004    There were no vitals filed for this visit.  Visit Diagnosis: Combined receptive and expressive aphasia due to cerebrovascular accident (CVA) [facility])      Subjective Assessment - 09/20/15 0937    Subjective Pt's husband MAc states he and pt communicate with mixture of words and gestures with questioning cues            SLP Evaluation [facility] - 09/20/15 0940    SLP Visit Information   SLP Received On 09/20/15   Referring Provider Helane Rima   Onset Date late August 2016   Medical Diagnosis CVA   General Information   Other Pertinent Information Pt with approx 2 weeks of acute/rehab ST, and three+ weeks of HHST at x2/week.   Prior Functional Status   Cognitive/Linguistic Baseline Within functional limits   Type of Home House    Lives With Spouse   Vocation Retired   Doctor, general practice   Overall Auditory Comprehension Impaired   Yes/No Questions Impaired  logical questions 88%, biographical     Commands Impaired   One Step Basic Commands 75-100% accurate   Two Step Basic Commands 50-74% accurate   Complex Commands 25-49% accurate   Conversation Simple   Other Conversation Comments Receptive ID of simple conversation with rare repeats demonstrated as functional.    EffectiveTechniques Extra processing time;Slowed speech   Verbal Expression   Overall Verbal Expression Impaired   Automatic Speech Counting  DOW-persev on months (cue needed)   Level of Generative/Spontaneous Verbalization Phrase   Repetition Impaired   Level of Impairment Word level  multisyllable - apraxic-like   Other Verbal Expression Comments In simple conversation, some pockets of clear and appropriate speech but largely non-functional. SLP questioning cues needed usually. SLP believes receptive language > expressive language.    Written Expression   Dominant Hand Left   Written Expression Exceptions to [facility]   Overall Writen Expression Mirrors verbal expression   Oral Motor/Sensory Function   Labial ROM Within Functional Limits   Labial Coordination Reduced   Lingual ROM Within Functional Limits   Lingual Coordination Reduced   Motor Speech   Overall Motor Speech Impaired   Articulation Impaired   Level of Impairment Conversation   Intelligibility Unable to assess (comment)   Motor Planning Impaired   Level of Impairment Sentence   Motor Speech  Errors Unaware;Aware;Groping for words;Inconsistent                         SLP Education - Oct 16, 2015 1036    Education provided Yes   Education Details Therapy course duration/frequency and deficit areas   Person(s) Educated Patient   Methods Explanation;Demonstration   Comprehension Verbalized understanding;Verbal cues required          SLP Short Term Goals - October 16, 2015 1040    SLP SHORT TERM GOAL #1   Title pt will generate appropriate language to participate in simple converastion with occasional  min A   Time 4   Period Weeks   or the first 8 sessions (for all STGs)   Status New   SLP SHORT TERM GOAL #2   Title pt will generate sentence level responses in structured tasks with rare min A   Time 4   Period Weeks   Status New   SLP SHORT TERM GOAL #3   Title pt will demo understanding of complex commands with extra time and rare min A   Time 4   Period Weeks   Status New   SLP SHORT TERM GOAL #4   Title demo understanding of longer verbal stimuli/mod complex conversation 90% with occasional min A   Time 4   Period Weeks   Status New   SLP SHORT TERM GOAL #5   Title pt will attempt to use multimodal communicaiton when verbal communication unsuccessful/difficult    Time 4   Period Weeks   Status New          SLP Long Term Goals - 2015/10/16 1050    SLP LONG TERM GOAL #1   Title pt will engage in simple to mod complex conversation functionally with modified independence (multimodal communication, compensations, extra time) with rare min A   Time 8   Period Weeks  (11-20-15)   Status New   SLP LONG TERM GOAL #2   Title pt will demo understanding of longer or mod complex verbal stimuli with modified independence   Time 8   Period Weeks   Status New   SLP LONG TERM GOAL #3   Title pt will perform HEP for motor speech/planning with 80% success and compensations used   Time 8   Period Weeks   Status New          Plan - 2015/10/16 1038    Clinical Impression Statement Pt presents today with receptive language>expressive language. Simple conversation is functional at this time, but only with questioning cues and A from listener. Pt uses some multimodal communication but could benefit from using this more. [place] needed to improve pt's recpetive and exoressive language skills to improve QOLand decr caregiver burden.   Speech Therapy Frequency 2x / week   Duration --  8 weeks   Treatment/Interventions SLP instruction and feedback;Compensatory strategies;Internal/external aids;Patient/family  education;Functional tasks;Cueing hierarchy;Multimodal communcation approach;Language facilitation   Potential to Achieve Goals Good   Potential Considerations Severity of impairments   Consulted and Agree with Plan of Care Patient          G-Codes - 10/16/2015 1105    Functional Assessment Tool Used noms- 4 (50% impaired)   Functional Limitations Spoken language comprehension   Spoken Language Comprehension Current Status [phone]) At least 40 percent but less than 60 percent impaired, limited or restricted   Spoken Language Comprehension Goal Status (G9160) At least 20 percent but less than 40 percent impaired, limited  or restricted      Problem List Patient Active Problem List   Diagnosis Date Noted  . Essential hypertension, benign 07/18/2014  . Atrial fibrillation, unspecified 07/18/2014  . Anxiety state, unspecified 07/18/2014    [facility] , [place], [place]  09/20/2015, 11:15 AM  [facility] [address] [place], [place], [address] Phone: [phone]   Fax:  [phone]  Name: Brenda Cunningham MRN: [id number] Date of Birth: 1951-09-05

## 2015-09-20 NOTE — Therapy (Signed)
[facility] [address] [place], [place], [address] Phone: [phone]   Fax:  [phone]  Occupational Therapy Treatment  Patient Details  Name: Brenda Cunningham MRN: [id number] Date of Birth: June 24, 1951 No Data Recorded  Encounter Date: 09/20/2015      OT End of Session - 09/20/15 1116    Visit Number 2   Number of Visits 25   Date for OT Re-Evaluation 11/15/15   Authorization Type Medicare   Authorization - Visit Number 2   Authorization - Number of Visits 10   OT Start Time 1015   OT Stop Time 1103   OT Time Calculation (min) 48 min   Activity Tolerance Patient tolerated treatment well   Behavior During Therapy [facility] for tasks assessed/performed      Past Medical History  Diagnosis Date  . Hypertension   . Arthritis   . A-fib ([place])   . Hyperlipidemia   . Stroke ([place]) 2010  . Breast cancer ([place]) 2008    s/p lumpectomy, chemo and radiation therapy  . Wears glasses   . Anxiety     Past Surgical History  Procedure Laterality Date  . Breast lumpectomy  2008    left  . Appendectomy    . Tubal ligation    . Colonoscopy  2004    There were no vitals filed for this visit.  Visit Diagnosis:  Hemiparesis affecting nondominant side as late effect of cerebrovascular accident [facility])  Spastic hemiplegia affecting nondominant side ([place])  Sensory deficit, right      Subjective Assessment - 09/20/15 1024    Subjective  I didn't sleep until 1 in the morning.  My  medicine                      OT Treatments/Exercises (OP) - 09/20/15 0001    Splinting   Splinting Fabricated a resting hand splint to promote optimal positioning of hand / wrist, as patient is experiencing increased tightness / spasticity in digits of right hand.  Reviewed splint wear and care with patient and significant other, [name].  Patient able to don / doff splint with cueing today.  Both expressed understanding regarding  brief trial wearing schedule prior to 2 hour wearing schedule.                  OT Education - 09/20/15 1115    Education provided Yes   Education Details splint wear and care, resting hand splint   Person(s) Educated Patient;Spouse   Methods Explanation;Demonstration;Verbal cues   Comprehension Verbalized understanding;Returned demonstration          OT Short Term Goals - 09/17/15 1737    OT SHORT TERM GOAL #1   Title Following facilitation,Patient will demonstrate active finger flexion through 30% available range as skill leading to pre grasp ability.     Time 4   Period Weeks   Status New   OT SHORT TERM GOAL #2   Title Patient will demonstrate active release of finger flexion to begin to release 2 inch or larger objects from right hand   Time 4   Period Weeks   Status New   OT SHORT TERM GOAL #3   Title Patient will demonstrate ability to flex shoulder to 120 degrees in preparation for reaching into cabinet for shelf at eye level with right arm   Time 4   Period Weeks   Status New   OT SHORT TERM GOAL #4  Title Patient will feed self with modified independence   Time 4   Period Weeks   Status New   OT SHORT TERM GOAL #5   Title Patient will complete grooming with modified independence   Time 4   Period Weeks   Status New           OT Long Term Goals - 09/17/15 1749    OT LONG TERM GOAL #1   Title Patient will grasp and release small object 1-3 inches with right hand with minimal cueing / facilitation.   Time 12   Period Weeks   Status New   OT LONG TERM GOAL #2   Title Patient will reach into overhead shelf and retrieve a lightweight (1/2-1lb) object with minimal cueing / facilitation   Time 12   Period Weeks   Status New   OT LONG TERM GOAL #3   Title Patient will be able to safely don/doff splint for right hand, and show awareness of wearing schedule.   Time 12   Period Weeks   Status New   OT LONG TERM GOAL #4   Title Patient will be  independent with a home exercise / home activity program   Time 12   Period Weeks   Status New   OT LONG TERM GOAL #5   Title Patient will carry light weight item e.g. laundry basket, across length of room, i.e. 20 feet.     Time 12   Period Weeks   Status New   OT LONG TERM GOAL #6   Title Patient will dress herself with modified independence               Plan - 09/20/15 1117    Clinical Impression Statement Patient is eager to improve functional abilities with ADL/IADL.  Patient with dense hemisensory loss, and developing tightness / spasticity in finger flexores, so resting hand splint fabricated with intent to promote optimal positioning, and prevent hand deformity.     Pt will benefit from skilled therapeutic intervention in order to improve on the following deficits (Retired) Decreased balance;Decreased endurance;Decreased coordination;Decreased range of motion;Decreased safety awareness;Impaired perceived functional ability;Impaired vision/preception;Impaired UE functional use;Impaired tone;Impaired sensation   Rehab Potential Good   OT Frequency 2x / week   OT Duration 12 weeks   OT Treatment/Interventions Self-care/ADL training;Electrical Stimulation;Moist Heat;Fluidtherapy;Therapeutic exercise;Neuromuscular education;DME and/or AE instruction;Manual Therapy;Functional Mobility Training;Splinting;Therapeutic activities;Visual/perceptual remediation/compensation;Patient/family education;Balance training   Plan Begin home activity program (wiping tables, opening drawers, etc) NMR Right UE   Consulted and Agree with Plan of Care Patient;Family member/caregiver   Family Member Consulted [name] - significant other of 15 years        Problem List Patient Active Problem List   Diagnosis Date Noted  . Essential hypertension, benign 07/18/2014  . Atrial fibrillation, unspecified 07/18/2014  . Anxiety state, unspecified 07/18/2014    Mariah Milling, OTR/L 09/20/2015,  11:23 AM  [facility] [address] [place], [place], [address] Phone: [phone]   Fax:  [phone]  Name: Annesha Delgreco MRN: [id number] Date of Birth: 1950/12/16

## 2015-09-24 ENCOUNTER — Ambulatory Visit: Payer: Medicare Other | Admitting: Occupational Therapy

## 2015-09-24 ENCOUNTER — Encounter: Payer: Self-pay | Admitting: Occupational Therapy

## 2015-09-24 DIAGNOSIS — R4189 Other symptoms and signs involving cognitive functions and awareness: Secondary | ICD-10-CM | POA: Diagnosis not present

## 2015-09-24 DIAGNOSIS — R449 Unspecified symptoms and signs involving general sensations and perceptions: Secondary | ICD-10-CM

## 2015-09-24 DIAGNOSIS — I69359 Hemiplegia and hemiparesis following cerebral infarction affecting unspecified side: Secondary | ICD-10-CM | POA: Diagnosis not present

## 2015-09-24 DIAGNOSIS — R2681 Unsteadiness on feet: Secondary | ICD-10-CM | POA: Diagnosis not present

## 2015-09-24 DIAGNOSIS — H539 Unspecified visual disturbance: Secondary | ICD-10-CM | POA: Diagnosis not present

## 2015-09-24 DIAGNOSIS — G8110 Spastic hemiplegia affecting unspecified side: Secondary | ICD-10-CM | POA: Diagnosis not present

## 2015-09-24 DIAGNOSIS — I639 Cerebral infarction, unspecified: Secondary | ICD-10-CM | POA: Diagnosis not present

## 2015-09-24 NOTE — Therapy (Signed)
[facility] [address] [place], [place], [address] Phone: [phone]   Fax:  [phone]  Occupational Therapy Treatment  Patient Details  Name: Brenda Cunningham MRN: [id number] Date of Birth: 1951-11-02 No Data Recorded  Encounter Date: 09/24/2015      OT End of Session - 09/24/15 1308    Visit Number 3   Number of Visits 25   Date for OT Re-Evaluation 11/15/15   Authorization Type Medicare   OT Start Time 1016   OT Stop Time 1100   OT Time Calculation (min) 44 min   Activity Tolerance Patient tolerated treatment well      Past Medical History  Diagnosis Date  . Hypertension   . Arthritis   . A-fib ([place])   . Hyperlipidemia   . Stroke ([place]) 2010  . Breast cancer ([place]) 2008    s/p lumpectomy, chemo and radiation therapy  . Wears glasses   . Anxiety     Past Surgical History  Procedure Laterality Date  . Breast lumpectomy  2008    left  . Appendectomy    . Tubal ligation    . Colonoscopy  2004    There were no vitals filed for this visit.  Visit Diagnosis:  Hemiparesis affecting nondominant side as late effect of cerebrovascular accident [facility])  Sensory deficit, right      Subjective Assessment - 09/24/15 1023    Subjective  No not really when asked if she is sleeping better.    Pertinent History see epic snapshot.  Pt with new stroke 8/16.  (pt also had stroke in 2010).     Currently in Pain? No/denies                      OT Treatments/Exercises (OP) - 09/24/15 0001    Neurological Re-education Exercises   Other Exercises 1 Neuor re ed to address overhead reach in supine first with ball with emphasis on elbow extension and open hand and then with PVC square with emphasis on elbow extension and grip. Pt able to complete with intermittent faciliation. Also addressed grasp and release in sitting with low surface.    Splinting   Splinting Pt tolerating splint well and now able to  wear all night. Pt instructed to wear at night and only when resting during the day. Pt agreeable.  Fixed strap and issued stockinette to help with sweating. Instructions also given in writing.                   OT Short Term Goals - 09/24/15 1306    OT SHORT TERM GOAL #1   Title Following facilitation,Patient will demonstrate active finger flexion through 30% available range as skill leading to pre grasp ability.     Time 4   Period Weeks   Status On-going   OT SHORT TERM GOAL #2   Title Patient will demonstrate active release of finger flexion to begin to release 2 inch or larger objects from right hand   Time 4   Period Weeks   Status On-going   OT SHORT TERM GOAL #3   Title Patient will demonstrate ability to flex shoulder to 120 degrees in preparation for reaching into cabinet for shelf at eye level with right arm   Time 4   Period Weeks   Status On-going   OT SHORT TERM GOAL #4   Title Patient will feed self with modified independence   Time 4  Period Weeks   Status On-going   OT SHORT TERM GOAL #5   Title Patient will complete grooming with modified independence   Time 4   Period Weeks   Status On-going           OT Long Term Goals - 09/24/15 1306    OT LONG TERM GOAL #1   Title Patient will grasp and release small object 1-3 inches with right hand with minimal cueing / facilitation.   Time 12   Period Weeks   Status On-going   OT LONG TERM GOAL #2   Title Patient will reach into overhead shelf and retrieve a lightweight (1/2-1lb) object with minimal cueing / facilitation   Time 12   Period Weeks   Status On-going   OT LONG TERM GOAL #3   Title Patient will be able to safely don/doff splint for right hand, and show awareness of wearing schedule.   Time 12   Period Weeks   Status On-going   OT LONG TERM GOAL #4   Title Patient will be independent with a home exercise / home activity program   Time 12   Period Weeks   Status On-going   OT LONG  TERM GOAL #5   Title Patient will carry light weight item e.g. laundry basket, across length of room, i.e. 20 feet.     Time 12   Period Weeks   Status On-going   OT LONG TERM GOAL #6   Title Patient will dress herself with modified independence   Status On-going               Plan - 09/24/15 1307    Clinical Impression Statement Pt making progress toward goals and tolerating resting hand splint well. Pt with improving grasp in R hand.    Pt will benefit from skilled therapeutic intervention in order to improve on the following deficits (Retired) Decreased balance;Decreased endurance;Decreased coordination;Decreased range of motion;Decreased safety awareness;Impaired perceived functional ability;Impaired vision/preception;Impaired UE functional use;Impaired tone;Impaired sensation   Rehab Potential Good   OT Frequency 2x / week   OT Duration 12 weeks   OT Treatment/Interventions Self-care/ADL training;Electrical Stimulation;Moist Heat;Fluidtherapy;Therapeutic exercise;Neuromuscular education;DME and/or AE instruction;Manual Therapy;Functional Mobility Training;Splinting;Therapeutic activities;Visual/perceptual remediation/compensation;Patient/family education;Balance training   Plan Home activity/HEP program, NMR for reach and grasp and release   Consulted and Agree with Plan of Care Patient        Problem List Patient Active Problem List   Diagnosis Date Noted  . Essential hypertension, benign 07/18/2014  . Atrial fibrillation, unspecified 07/18/2014  . Anxiety state, unspecified 07/18/2014    Quay Burow, OTR/L 09/24/2015, 1:09 PM  [facility] [address] [place], [place], [address] Phone: [phone]   Fax:  [phone]  Name: Brenda Cunningham MRN: [id number] Date of Birth: September 05, 1951

## 2015-09-24 NOTE — Patient Instructions (Signed)
For splint, now that you can wear the splint all night without any problems, you can wear the splint at night to sleep only. You do not have to wear it during the day. If you take a rest in the afternoon you can put it on while you rest if you want to.  You can use the cotton sleeve under the splint to help absorb the moisture when you sweat.   Make sure you still check you skin when you take it off in the morning.

## 2015-09-25 ENCOUNTER — Inpatient Hospital Stay: Payer: Medicare Other | Admitting: Medical

## 2015-09-26 ENCOUNTER — Ambulatory Visit: Payer: Medicare Other | Admitting: Occupational Therapy

## 2015-09-26 ENCOUNTER — Telehealth: Payer: Self-pay

## 2015-09-26 ENCOUNTER — Encounter: Payer: Self-pay | Admitting: Occupational Therapy

## 2015-09-26 DIAGNOSIS — H539 Unspecified visual disturbance: Secondary | ICD-10-CM | POA: Diagnosis not present

## 2015-09-26 DIAGNOSIS — I69359 Hemiplegia and hemiparesis following cerebral infarction affecting unspecified side: Secondary | ICD-10-CM

## 2015-09-26 DIAGNOSIS — R2681 Unsteadiness on feet: Secondary | ICD-10-CM | POA: Diagnosis not present

## 2015-09-26 DIAGNOSIS — G8110 Spastic hemiplegia affecting unspecified side: Secondary | ICD-10-CM | POA: Diagnosis not present

## 2015-09-26 DIAGNOSIS — R4189 Other symptoms and signs involving cognitive functions and awareness: Secondary | ICD-10-CM | POA: Diagnosis not present

## 2015-09-26 DIAGNOSIS — R449 Unspecified symptoms and signs involving general sensations and perceptions: Secondary | ICD-10-CM

## 2015-09-26 DIAGNOSIS — I639 Cerebral infarction, unspecified: Secondary | ICD-10-CM | POA: Diagnosis not present

## 2015-09-26 NOTE — Therapy (Signed)
[facility] [address] [place], [place], [address] Phone: [phone]   Fax:  [phone]  Occupational Therapy Treatment  Patient Details  Name: Brenda Cunningham MRN: [id number] Date of Birth: [birth year] Referring Provider: Reece Levy  Encounter Date: 09/26/2015      OT End of Session - 09/26/15 1304    Visit Number 4   Number of Visits 25   Date for OT Re-Evaluation 11/15/15   Authorization Type Medicare   Authorization - Visit Number 4   Authorization - Number of Visits 10   OT Start Time 1100   OT Stop Time 1145   OT Time Calculation (min) 45 min   Activity Tolerance Patient tolerated treatment well   Behavior During Therapy [facility] for tasks assessed/performed      Past Medical History  Diagnosis Date  . Hypertension   . Arthritis   . A-fib ([place])   . Hyperlipidemia   . Stroke ([place]) 2010  . Breast cancer ([place]) 2008    s/p lumpectomy, chemo and radiation therapy  . Wears glasses   . Anxiety     Past Surgical History  Procedure Laterality Date  . Breast lumpectomy  2008    left  . Appendectomy    . Tubal ligation    . Colonoscopy  2004    There were no vitals filed for this visit.  Visit Diagnosis:  Sensory deficit, right  Hemiparesis affecting nondominant side as late effect of cerebrovascular accident [facility])      Subjective Assessment - 09/26/15 1110    Subjective  My daughter is coming from [place] for three days   Pertinent History see epic snapshot.  Pt with new stroke 8/16.  (pt also had stroke in 2010).     Currently in Pain? No/denies            [facility] OT Assessment - 09/26/15 0001    Assessment   Referring Provider Reece Levy                  OT Treatments/Exercises (OP) - 09/26/15 0001    Neurological Re-education Exercises   Other Exercises 1 Neuromuscular reeducation from forearm to digits to address isolated control of movement.  Used isolated movement  and combined movements to interact with items where she could get visual feedback as she has reduced to absent somatosensory awareness.  Used right hand to stabilize water bottle and hand over hand to address drinking from water bottle with right hand.  Flipping over cards, and forearm gym.  Patient now indicates beginning sensation in dorsum of right wrist, and in joints with stretch to end range extension.                 OT Education - 09/26/15 1303    Education provided Yes   Education Details Begin to use right hand for grasp, release, hand to mouth, and pinch/ opposiotion (flipping cards)   Person(s) Educated Patient   Methods Explanation;Demonstration   Comprehension Verbalized understanding;Verbal cues required          OT Short Term Goals - 09/24/15 1306    OT SHORT TERM GOAL #1   Title Following facilitation,Patient will demonstrate active finger flexion through 30% available range as skill leading to pre grasp ability.     Time 4   Period Weeks   Status On-going   OT SHORT TERM GOAL #2   Title Patient will demonstrate active release of finger flexion to begin to  release 2 inch or larger objects from right hand   Time 4   Period Weeks   Status On-going   OT SHORT TERM GOAL #3   Title Patient will demonstrate ability to flex shoulder to 120 degrees in preparation for reaching into cabinet for shelf at eye level with right arm   Time 4   Period Weeks   Status On-going   OT SHORT TERM GOAL #4   Title Patient will feed self with modified independence   Time 4   Period Weeks   Status On-going   OT SHORT TERM GOAL #5   Title Patient will complete grooming with modified independence   Time 4   Period Weeks   Status On-going           OT Long Term Goals - 09/24/15 1306    OT LONG TERM GOAL #1   Title Patient will grasp and release small object 1-3 inches with right hand with minimal cueing / facilitation.   Time 12   Period Weeks   Status On-going   OT  LONG TERM GOAL #2   Title Patient will reach into overhead shelf and retrieve a lightweight (1/2-1lb) object with minimal cueing / facilitation   Time 12   Period Weeks   Status On-going   OT LONG TERM GOAL #3   Title Patient will be able to safely don/doff splint for right hand, and show awareness of wearing schedule.   Time 12   Period Weeks   Status On-going   OT LONG TERM GOAL #4   Title Patient will be independent with a home exercise / home activity program   Time 12   Period Weeks   Status On-going   OT LONG TERM GOAL #5   Title Patient will carry light weight item e.g. laundry basket, across length of room, i.e. 20 feet.     Time 12   Period Weeks   Status On-going   OT LONG TERM GOAL #6   Title Patient will dress herself with modified independence   Status On-going               Plan - 09/26/15 1304    Clinical Impression Statement Patient progressing steadily toward goals.  Patient with increasing sensory awareness in right wrist, and increased active dgit flexion to aide with grasp today.     Pt will benefit from skilled therapeutic intervention in order to improve on the following deficits (Retired) Decreased balance;Decreased endurance;Decreased coordination;Decreased range of motion;Decreased safety awareness;Impaired perceived functional ability;Impaired vision/preception;Impaired UE functional use;Impaired tone;Impaired sensation   Rehab Potential Good   OT Frequency 2x / week   OT Duration 12 weeks   OT Treatment/Interventions Self-care/ADL training;Electrical Stimulation;Moist Heat;Fluidtherapy;Therapeutic exercise;Neuromuscular education;DME and/or AE instruction;Manual Therapy;Functional Mobility Training;Splinting;Therapeutic activities;Visual/perceptual remediation/compensation;Patient/family education;Balance training   Plan Initiated Home activity program today - flipping cards, turning page in magazine with right hand   Consulted and Agree with Plan  of Care Patient        Problem List Patient Active Problem List   Diagnosis Date Noted  . Essential hypertension, benign 07/18/2014  . Atrial fibrillation, unspecified 07/18/2014  . Anxiety state, unspecified 07/18/2014    Mariah Milling, OTR/L 09/26/2015, 1:07 PM  [facility] [address] [place], [place], [address] Phone: [phone]   Fax:  [phone]  Name: Brenda Cunningham MRN: [id number] Date of Birth: Aug 22, 1951

## 2015-09-26 NOTE — Telephone Encounter (Signed)
Medical records sent out to [facility] on 09/26/15

## 2015-10-01 ENCOUNTER — Ambulatory Visit: Payer: Medicare Other | Admitting: Occupational Therapy

## 2015-10-01 ENCOUNTER — Encounter: Payer: Self-pay | Admitting: Occupational Therapy

## 2015-10-01 DIAGNOSIS — I69359 Hemiplegia and hemiparesis following cerebral infarction affecting unspecified side: Secondary | ICD-10-CM | POA: Diagnosis not present

## 2015-10-01 DIAGNOSIS — G8110 Spastic hemiplegia affecting unspecified side: Secondary | ICD-10-CM

## 2015-10-01 DIAGNOSIS — R4189 Other symptoms and signs involving cognitive functions and awareness: Secondary | ICD-10-CM | POA: Diagnosis not present

## 2015-10-01 DIAGNOSIS — H539 Unspecified visual disturbance: Secondary | ICD-10-CM | POA: Diagnosis not present

## 2015-10-01 DIAGNOSIS — R2681 Unsteadiness on feet: Secondary | ICD-10-CM | POA: Diagnosis not present

## 2015-10-01 DIAGNOSIS — R449 Unspecified symptoms and signs involving general sensations and perceptions: Secondary | ICD-10-CM

## 2015-10-01 DIAGNOSIS — I639 Cerebral infarction, unspecified: Secondary | ICD-10-CM | POA: Diagnosis not present

## 2015-10-01 NOTE — Therapy (Signed)
[facility] [address] [place], [place], [address] Phone: [phone]   Fax:  [phone]  Occupational Therapy Treatment  Patient Details  Name: Brenda Cunningham MRN: [id number] Date of Birth: Apr 08, 1951 Referring Provider: Reece Levy  Encounter Date: 10/01/2015      OT End of Session - 10/01/15 1035    Visit Number 5   Number of Visits 25   Date for OT Re-Evaluation 11/15/15   Authorization Type Medicare   OT Start Time 0845   OT Stop Time 0929   OT Time Calculation (min) 44 min   Activity Tolerance Patient tolerated treatment well      Past Medical History  Diagnosis Date  . Hypertension   . Arthritis   . A-fib ([employer])   . Hyperlipidemia   . Stroke ([place]) 2010  . Breast cancer ([place]) 2008    s/p lumpectomy, chemo and radiation therapy  . Wears glasses   . Anxiety     Past Surgical History  Procedure Laterality Date  . Breast lumpectomy  2008    left  . Appendectomy    . Tubal ligation    . Colonoscopy  2004    There were no vitals filed for this visit.  Visit Diagnosis:  Sensory deficit, right  Spastic hemiplegia affecting nondominant side ([facility])  Visual disturbance      Subjective Assessment - 10/01/15 0851    Subjective   I had a great visit with my daughter   Pertinent History see epic snapshot.  Pt with new stroke 8/16.  (pt also had stroke in 2010).     Currently in Pain? No/denies                      OT Treatments/Exercises (OP) - 10/01/15 0001    Neurological Re-education Exercises   Other Exercises 1 Neuro re ed to address grip, grip and sliding object, progressed to grip and reach, using gross grasp. Initially pt had difficulty engaging active grip but this improves using functional activity. At end of session pt able to grip, lift and  place cylindrical object at approximately 95* of reach. Also addressed wrist movement to focus on wrist extension, ular and radial  deviation to allow pt to lift object and orient to object to place down. Transferred this skill to using RUE with gross grasp to use spoon to transfer beans from bowl to open container with light min assist. Pt improves with repetition and use of functional activity. At end of session pt able to grasp ball and then "turn off" hand enough to allow her to release  ball with approximately 75* of reach and min facilitation at elbow for arm control..                  OT Short Term Goals - 10/01/15 1033    OT SHORT TERM GOAL #1   Title Following facilitation,Patient will demonstrate active finger flexion through 30% available range as skill leading to pre grasp ability.     Time 4   Period Weeks   Status Achieved   OT SHORT TERM GOAL #2   Title Patient will demonstrate active release of finger flexion to begin to release 2 inch or larger objects from right hand   Time 4   Period Weeks   Status On-going   OT SHORT TERM GOAL #3   Title Patient will demonstrate ability to flex shoulder to 120 degrees in preparation for reaching into  cabinet for shelf at eye level with right arm   Time 4   Period Weeks   Status On-going   OT SHORT TERM GOAL #4   Title Patient will feed self with modified independence   Time 4   Period Weeks   Status On-going   OT SHORT TERM GOAL #5   Title Patient will complete grooming with modified independence   Time 4   Period Weeks   Status On-going           OT Long Term Goals - 10/01/15 1034    OT LONG TERM GOAL #1   Title Patient will grasp and release small object 1-3 inches with right hand with minimal cueing / facilitation.   Time 12   Period Weeks   Status On-going   OT LONG TERM GOAL #2   Title Patient will reach into overhead shelf and retrieve a lightweight (1/2-1lb) object with minimal cueing / facilitation   Time 12   Period Weeks   Status On-going   OT LONG TERM GOAL #3   Title Patient will be able to safely don/doff splint for right  hand, and show awareness of wearing schedule.   Time 12   Period Weeks   Status On-going   OT LONG TERM GOAL #4   Title Patient will be independent with a home exercise / home activity program   Time 12   Period Weeks   Status On-going   OT LONG TERM GOAL #5   Title Patient will carry light weight item e.g. laundry basket, across length of room, i.e. 20 feet.     Time 12   Period Weeks   Status On-going   OT LONG TERM GOAL #6   Title Patient will dress herself with modified independence   Status On-going               Plan - 10/01/15 1034    Clinical Impression Statement Pt making good progress toward goals and very motivated to use RUE functionally at home. Pt using RUE bilaterally during meals and incorporating RUE into basic self care more.    Pt will benefit from skilled therapeutic intervention in order to improve on the following deficits (Retired) Decreased balance;Decreased endurance;Decreased coordination;Decreased range of motion;Decreased safety awareness;Impaired perceived functional ability;Impaired vision/preception;Impaired UE functional use;Impaired tone;Impaired sensation   Rehab Potential Good   OT Frequency 2x / week   OT Duration 12 weeks   OT Treatment/Interventions Self-care/ADL training;Electrical Stimulation;Moist Heat;Fluidtherapy;Therapeutic exercise;Neuromuscular education;DME and/or AE instruction;Manual Therapy;Functional Mobility Training;Splinting;Therapeutic activities;Visual/perceptual remediation/compensation;Patient/family education;Balance training   Plan neuro re ed to RUE for reach with grasp and release, add to HEP if possible   Consulted and Agree with Plan of Care Patient        Problem List Patient Active Problem List   Diagnosis Date Noted  . Essential hypertension, benign 07/18/2014  . Atrial fibrillation, unspecified 07/18/2014  . Anxiety state, unspecified 07/18/2014    Quay Burow, OTR/L 10/01/2015, 10:37  AM  [facility] [address] [place], [place], [address] Phone: [phone]   Fax:  [phone]  Name: Brenda Cunningham MRN: [id number] Date of Birth: 30-Aug-1951

## 2015-10-02 ENCOUNTER — Encounter: Payer: Self-pay | Admitting: Occupational Therapy

## 2015-10-02 ENCOUNTER — Ambulatory Visit: Payer: Medicare Other | Admitting: Occupational Therapy

## 2015-10-02 DIAGNOSIS — I639 Cerebral infarction, unspecified: Secondary | ICD-10-CM | POA: Diagnosis not present

## 2015-10-02 DIAGNOSIS — R449 Unspecified symptoms and signs involving general sensations and perceptions: Secondary | ICD-10-CM

## 2015-10-02 DIAGNOSIS — G8110 Spastic hemiplegia affecting unspecified side: Secondary | ICD-10-CM

## 2015-10-02 DIAGNOSIS — H539 Unspecified visual disturbance: Secondary | ICD-10-CM | POA: Diagnosis not present

## 2015-10-02 DIAGNOSIS — R2681 Unsteadiness on feet: Secondary | ICD-10-CM | POA: Diagnosis not present

## 2015-10-02 DIAGNOSIS — R4189 Other symptoms and signs involving cognitive functions and awareness: Principal | ICD-10-CM

## 2015-10-02 DIAGNOSIS — I69359 Hemiplegia and hemiparesis following cerebral infarction affecting unspecified side: Secondary | ICD-10-CM | POA: Diagnosis not present

## 2015-10-02 NOTE — Therapy (Signed)
[facility] [address] [place], [place], [address] Phone: [phone]   Fax:  [phone]  Occupational Therapy Treatment  Patient Details  Name: Brenda Cunningham MRN: [id number] Date of Birth: 11-24-1951 Referring Provider: Reece Levy  Encounter Date: 10/02/2015      OT End of Session - 10/02/15 1244    Visit Number 6   Number of Visits 25   Date for OT Re-Evaluation 11/15/15   Authorization Type Medicare - pt will need G code and progress note every 10th visit   Authorization - Visit Number 6   Authorization - Number of Visits 10   OT Start Time 1146   OT Stop Time 1231   OT Time Calculation (min) 45 min   Activity Tolerance Patient tolerated treatment well      Past Medical History  Diagnosis Date  . Hypertension   . Arthritis   . A-fib ([place])   . Hyperlipidemia   . Stroke ([place]) 2010  . Breast cancer ([place]) 2008    s/p lumpectomy, chemo and radiation therapy  . Wears glasses   . Anxiety     Past Surgical History  Procedure Laterality Date  . Breast lumpectomy  2008    left  . Appendectomy    . Tubal ligation    . Colonoscopy  2004    There were no vitals filed for this visit.  Visit Diagnosis:  Sensory deficit, right  Spastic hemiplegia affecting nondominant side (HCC)  Visual disturbance      Subjective Assessment - 10/02/15 1155    Subjective  I am doing great!   Pertinent History see epic snapshot.  Pt with new stroke 8/16.  (pt also had stroke in 2010).     Patient Stated Goals pt points to hand and gestures she wants to use it   Currently in Pain? No/denies                      OT Treatments/Exercises (OP) - 10/02/15 0001    Neurological Re-education Exercises   Other Exercises 1 Neuro re ed to address grasp and release in R hand following estim.  Focus on hand orientation prior to picking up object, mod facilitation to open hand in order to pick up object, min  facilitation to grasp. Pt then able to close hand and hold, lift and move object without assistance.  With larger cylindrical and round objects pt able to release without assist.  Very light facilitation for release with smaller object (including one inch block)    Modalities   Modalities Electrical Stimulation   Electrical Stimulation   Electrical Stimulation Location wrist/fingers   Electrical Stimulation Action wrist and finger extension   Electrical Stimulation Parameters 250 pps, 50 pulse, 24 intensity, 10 second on/off, 2.5 ramp. Pt tolerated well.   Electrical Stimulation Goals Neuromuscular facilitation;Tone                  OT Short Term Goals - 10/02/15 1240    OT SHORT TERM GOAL #1   Title Following facilitation,Patient will demonstrate active finger flexion through 30% available range as skill leading to pre grasp ability.  10/15/2015   Time 4   Period Weeks   Status Achieved   OT SHORT TERM GOAL #2   Title Patient will demonstrate active release of finger flexion to begin to release 2 inch or larger objects from right hand   Time 4   Period Weeks   Status  On-going   OT SHORT TERM GOAL #3   Title Patient will demonstrate ability to flex shoulder to 120 degrees in preparation for reaching into cabinet for shelf at eye level with right arm   Time 4   Period Weeks   Status On-going   OT SHORT TERM GOAL #4   Title Patient will feed self with modified independence   Time 4   Period Weeks   Status On-going   OT SHORT TERM GOAL #5   Title Patient will complete grooming with modified independence   Time 4   Period Weeks   Status On-going           OT Long Term Goals - 10/02/15 1242    OT LONG TERM GOAL #1   Title Patient will grasp and release small object 1-3 inches with right hand with minimal cueing / facilitation.  11/12/2015   Time 12   Period Weeks   Status On-going   OT LONG TERM GOAL #2   Title Patient will reach into overhead shelf and retrieve a  lightweight (1/2-1lb) object with minimal cueing / facilitation   Time 12   Period Weeks   Status On-going   OT LONG TERM GOAL #3   Title Patient will be able to safely don/doff splint for right hand, and show awareness of wearing schedule.   Time 12   Period Weeks   Status On-going   OT LONG TERM GOAL #4   Title Patient will be independent with a home exercise / home activity program   Time 12   Period Weeks   Status On-going   OT LONG TERM GOAL #5   Title Patient will carry light weight item e.g. laundry basket, across length of room, i.e. 20 feet.     Time 12   Period Weeks   Status On-going   OT LONG TERM GOAL #6   Title Patient will dress herself with modified independence   Status On-going               Plan - 10/02/15 1242    Clinical Impression Statement Pt continues to make progress and tolerated estim well today. Pt eager to work on functional hand use.   Pt will benefit from skilled therapeutic intervention in order to improve on the following deficits (Retired) Decreased balance;Decreased endurance;Decreased coordination;Decreased range of motion;Decreased safety awareness;Impaired perceived functional ability;Impaired vision/preception;Impaired UE functional use;Impaired tone;Impaired sensation   Rehab Potential Good   OT Frequency 2x / week   OT Duration 12 weeks   OT Treatment/Interventions Self-care/ADL training;Electrical Stimulation;Moist Heat;Fluidtherapy;Therapeutic exercise;Neuromuscular education;DME and/or AE instruction;Manual Therapy;Functional Mobility Training;Splinting;Therapeutic activities;Visual/perceptual remediation/compensation;Patient/family education;Balance training   Plan NMR to RUE for reach with grasp and release, add to HEP if possible   Consulted and Agree with Plan of Care Patient        Problem List Patient Active Problem List   Diagnosis Date Noted  . Essential hypertension, benign 07/18/2014  . Atrial fibrillation,  unspecified 07/18/2014  . Anxiety state, unspecified 07/18/2014    Quay Burow, OTR/L 10/02/2015, 12:45 PM  [facility] [address] [place], [place], [address] Phone: [phone]   Fax:  [phone]  Name: Conley Pawling MRN: [id number] Date of Birth: August 08, 1951

## 2015-10-07 ENCOUNTER — Ambulatory Visit: Payer: Medicare Other | Admitting: Occupational Therapy

## 2015-10-07 ENCOUNTER — Encounter: Payer: Self-pay | Admitting: Occupational Therapy

## 2015-10-07 VITALS — BP 127/73 | HR 78

## 2015-10-07 DIAGNOSIS — H539 Unspecified visual disturbance: Secondary | ICD-10-CM | POA: Diagnosis not present

## 2015-10-07 DIAGNOSIS — G8110 Spastic hemiplegia affecting unspecified side: Secondary | ICD-10-CM | POA: Diagnosis not present

## 2015-10-07 DIAGNOSIS — R2681 Unsteadiness on feet: Secondary | ICD-10-CM | POA: Diagnosis not present

## 2015-10-07 DIAGNOSIS — R4189 Other symptoms and signs involving cognitive functions and awareness: Secondary | ICD-10-CM | POA: Diagnosis not present

## 2015-10-07 DIAGNOSIS — I639 Cerebral infarction, unspecified: Secondary | ICD-10-CM | POA: Diagnosis not present

## 2015-10-07 DIAGNOSIS — I69359 Hemiplegia and hemiparesis following cerebral infarction affecting unspecified side: Secondary | ICD-10-CM | POA: Diagnosis not present

## 2015-10-07 DIAGNOSIS — R449 Unspecified symptoms and signs involving general sensations and perceptions: Secondary | ICD-10-CM

## 2015-10-07 NOTE — Therapy (Signed)
[facility] [address] [place], [place], [address] Phone: [phone]   Fax:  [phone]  Occupational Therapy Treatment  Patient Details  Name: Brenda Cunningham MRN: [id number] Date of Birth: December 09, 1950 Referring Provider: Reece Levy  Encounter Date: 10/07/2015      OT End of Session - 10/07/15 1245    Visit Number 7   Number of Visits 25   Date for OT Re-Evaluation 11/15/15   Authorization Type Medicare - pt will need G code and progress note every 10th visit   Authorization - Visit Number 7   Authorization - Number of Visits 10   OT Start Time 0845   OT Stop Time 0928   OT Time Calculation (min) 43 min   Activity Tolerance Patient tolerated treatment well      Past Medical History  Diagnosis Date  . Hypertension   . Arthritis   . A-fib ([place])   . Hyperlipidemia   . Stroke ([place]) 2010  . Breast cancer ([place]) 2008    s/p lumpectomy, chemo and radiation therapy  . Wears glasses   . Anxiety     Past Surgical History  Procedure Laterality Date  . Breast lumpectomy  2008    left  . Appendectomy    . Tubal ligation    . Colonoscopy  2004    Filed Vitals:   10/07/15 0852  BP: 127/73  Pulse: 78    Visit Diagnosis:  Sensory deficit, right  Spastic hemiplegia affecting nondominant side (HCC)  Visual disturbance      Subjective Assessment - 10/07/15 0851    Subjective  My shoulder is stiffed   Pertinent History see epic snapshot.  Pt with new stroke 8/16.  (pt also had stroke in 2010).     Patient Stated Goals pt points to hand and gestures she wants to use it   Currently in Pain? No/denies                      OT Treatments/Exercises (OP) - 10/07/15 0001    Neurological Re-education Exercises   Other Exercises 1 Neuro re ed to address shouler flexion in supine and sitting. Pt uses trunk rotation , retraction and lateral lean to raise RUE above 85* of shoulder flexion.  Malalignment also causes increased hand flexor tone above 90*.  Pt has old inury to LEFT shoulder and shoulder flexion above 100* causes pain therefore bliateral activities in supine or sitting will not work for this patient. Addressed unilateral reaching in supine to approximately 130* of flexion - pt needs min faciliatation to decrease internal rotation and elbow flexion. Also addresed scapular movement with shoulder flexion and decreasing tightness in shoulder girdle to allow length for reach. Then addressed shoulder flexion in sitting with bilateral reach to approxmiately 100* without facilitation, 165*with moderate faciltiation (pt's left shoulder not painful in sitting).  Pt also taught stretches to decrease shoulder tightness. Will instruct pt's signficant other [name] next visit and provide activities in writing [facility] not available today)                  OT Short Term Goals - 10/07/15 1237    OT SHORT TERM GOAL #1   Title Following facilitation,Patient will demonstrate active finger flexion through 30% available range as skill leading to pre grasp ability.  10/15/2015   Time 4   Period Weeks   Status Achieved   OT SHORT TERM GOAL #2   Title Patient will  demonstrate active release of finger flexion to begin to release 2 inch or larger objects from right hand   Time 4   Period Weeks   Status Achieved   OT SHORT TERM GOAL #3   Title Patient will demonstrate ability to flex shoulder to 120 degrees in preparation for reaching into cabinet for shelf at eye level with right arm   Time 4   Period Weeks   Status On-going   OT SHORT TERM GOAL #4   Title Patient will feed self with modified independence   Time 4   Period Weeks   Status On-going   OT SHORT TERM GOAL #5   Title Patient will complete grooming with modified independence   Time 4   Period Weeks   Status On-going           OT Long Term Goals - 10/07/15 1243    OT LONG TERM GOAL #1   Title Patient will grasp and  release small object 1-3 inches with right hand with minimal cueing / facilitation.  11/12/2015   Time 12   Period Weeks   Status On-going   OT LONG TERM GOAL #2   Title Patient will reach into overhead shelf and retrieve a lightweight (1/2-1lb) object with minimal cueing / facilitation   Time 12   Period Weeks   Status On-going   OT LONG TERM GOAL #3   Title Patient will be able to safely don/doff splint for right hand, and show awareness of wearing schedule.   Time 12   Period Weeks   Status On-going   OT LONG TERM GOAL #4   Title Patient will be independent with a home exercise / home activity program   Time 12   Period Weeks   Status On-going   OT LONG TERM GOAL #5   Title Patient will carry light weight item e.g. laundry basket, across length of room, i.e. 20 feet.     Time 12   Period Weeks   Status On-going   OT LONG TERM GOAL #6   Title Patient will dress herself with modified independence   Status On-going               Plan - 10/07/15 1243    Clinical Impression Statement Pt continues to make good progress toward goals. Pt's R shoulder quite tight and pt has ingrained abnormal movement patterns that improve with faciltaiton.    Pt will benefit from skilled therapeutic intervention in order to improve on the following deficits (Retired) Decreased balance;Decreased endurance;Decreased coordination;Decreased range of motion;Decreased safety awareness;Impaired perceived functional ability;Impaired vision/preception;Impaired UE functional use;Impaired tone;Impaired sensation   Rehab Potential Good   OT Frequency 2x / week   OT Duration 12 weeks   OT Treatment/Interventions Self-care/ADL training;Electrical Stimulation;Moist Heat;Fluidtherapy;Therapeutic exercise;Neuromuscular education;DME and/or AE instruction;Manual Therapy;Functional Mobility Training;Splinting;Therapeutic activities;Visual/perceptual remediation/compensation;Patient/family education;Balance training    Plan teach [name] HEP, NMR to RUE for reach, grasp and release, functional use of R hand   Consulted and Agree with Plan of Care Patient        Problem List Patient Active Problem List   Diagnosis Date Noted  . Essential hypertension, benign 07/18/2014  . Atrial fibrillation, unspecified 07/18/2014  . Anxiety state, unspecified 07/18/2014    Quay Burow, OTR/L 10/07/2015, 12:46 PM  [facility] [address] [place], [place], [address] Phone: [phone]   Fax:  [phone]  Name: Lacrystal Barbe MRN: [id number] Date of Birth: 02/08/1951

## 2015-10-10 ENCOUNTER — Encounter: Payer: Self-pay | Admitting: Medical

## 2015-10-10 ENCOUNTER — Ambulatory Visit: Payer: Medicare Other | Admitting: Occupational Therapy

## 2015-10-10 ENCOUNTER — Ambulatory Visit: Payer: Medicare Other | Admitting: Speech Pathology

## 2015-10-10 DIAGNOSIS — I69322 Dysarthria following cerebral infarction: Secondary | ICD-10-CM | POA: Diagnosis not present

## 2015-10-10 DIAGNOSIS — I63512 Cerebral infarction due to unspecified occlusion or stenosis of left middle cerebral artery: Secondary | ICD-10-CM | POA: Diagnosis not present

## 2015-10-10 DIAGNOSIS — F329 Major depressive disorder, single episode, unspecified: Secondary | ICD-10-CM | POA: Diagnosis not present

## 2015-10-10 DIAGNOSIS — I639 Cerebral infarction, unspecified: Secondary | ICD-10-CM | POA: Diagnosis not present

## 2015-10-10 DIAGNOSIS — Z23 Encounter for immunization: Secondary | ICD-10-CM | POA: Diagnosis not present

## 2015-10-10 DIAGNOSIS — K5900 Constipation, unspecified: Secondary | ICD-10-CM | POA: Diagnosis not present

## 2015-10-14 ENCOUNTER — Encounter: Payer: Self-pay | Admitting: Occupational Therapy

## 2015-10-14 ENCOUNTER — Ambulatory Visit: Payer: Medicare Other | Attending: Family Medicine | Admitting: Occupational Therapy

## 2015-10-14 DIAGNOSIS — R2681 Unsteadiness on feet: Secondary | ICD-10-CM | POA: Insufficient documentation

## 2015-10-14 DIAGNOSIS — G8110 Spastic hemiplegia affecting unspecified side: Secondary | ICD-10-CM | POA: Diagnosis not present

## 2015-10-14 DIAGNOSIS — R4701 Aphasia: Secondary | ICD-10-CM | POA: Diagnosis not present

## 2015-10-14 DIAGNOSIS — R4189 Other symptoms and signs involving cognitive functions and awareness: Secondary | ICD-10-CM | POA: Insufficient documentation

## 2015-10-14 DIAGNOSIS — I639 Cerebral infarction, unspecified: Secondary | ICD-10-CM | POA: Diagnosis not present

## 2015-10-14 DIAGNOSIS — R449 Unspecified symptoms and signs involving general sensations and perceptions: Secondary | ICD-10-CM

## 2015-10-14 DIAGNOSIS — I69359 Hemiplegia and hemiparesis following cerebral infarction affecting unspecified side: Secondary | ICD-10-CM | POA: Diagnosis not present

## 2015-10-14 DIAGNOSIS — H539 Unspecified visual disturbance: Secondary | ICD-10-CM | POA: Diagnosis not present

## 2015-10-14 NOTE — Therapy (Signed)
[facility] [address] [place], [place], [address] Phone: [phone]   Fax:  [phone]  Occupational Therapy Treatment  Patient Details  Name: Brenda Cunningham MRN: [id number] Date of Birth: [birth year] Referring Provider: Reece Levy  Encounter Date: 10/14/2015      OT End of Session - 10/14/15 1059    Visit Number 8   Number of Visits 25   Date for OT Re-Evaluation 11/15/15   Authorization Type Medicare - pt will need G code and progress note every 10th visit   Authorization - Visit Number 8   Authorization - Number of Visits 10   OT Start Time 0935   OT Stop Time 1020   OT Time Calculation (min) 45 min   Activity Tolerance Patient tolerated treatment well   Behavior During Therapy [facility] for tasks assessed/performed      Past Medical History  Diagnosis Date  . Hypertension   . Arthritis   . A-fib ([place])   . Hyperlipidemia   . Stroke ([place]) 2010  . Breast cancer ([place]) 2008    s/p lumpectomy, chemo and radiation therapy  . Wears glasses   . Anxiety     Past Surgical History  Procedure Laterality Date  . Breast lumpectomy  2008    left  . Appendectomy    . Tubal ligation    . Colonoscopy  2004    There were no vitals filed for this visit.  Visit Diagnosis:  Sensory deficit, right  Spastic hemiplegia affecting nondominant side (HCC)  Visual disturbance  Hemiparesis affecting nondominant side as late effect of cerebrovascular accident [facility])      Subjective Assessment - 10/14/15 0942    Subjective  I did good (when asked about dr appointment)   Pertinent History see epic snapshot.  Pt with new stroke 8/16.  (pt also had stroke in 2010).     Patient Stated Goals pt points to hand and gestures she wants to use it   Currently in Pain? No/denies                      OT Treatments/Exercises (OP) - 10/14/15 0001    ADLs   Eating Practiced driinking from both a cup and bottle to  drink from. Pt needs encouragement to use RUE as much as possible for holding objects and as active assist. Pt able to hold bottle in R hand and unscrew with left hand (pt's dominant hand). Pt needs assist to open hand in order to place hand on bottle or cup but is then able to actively hold and bring bottle to lips with R hand. Pt does require some assist from left hand if cup or bottle are fuller. Demonstrated to pt and [name], pt's significant other. how to use left hand to assist vs doing everything one handed. Also concretely discussed activities that she can incoporate RUE into such as holding tooth paste tube while she opens with left hand, using both hands to wash face and incorporating RUE into bathing when showering. picking up and moving objects, carrying objects. Instructed [name] in how to assist pt in opening hand so that she can place hand on object in order to grasp as well as instructed pt in how to use L hand to put objects into her right hand. Pt and [name] able to return demonstrate.    ADL Comments Checked STG's - see update.   Neurological Re-education Exercises   Other Exercises 1 Neuro re  ed to address reach to 165* bilaterally and 120* unillaterally. Also addressed grasp, release, picking up various sized objects and place and hold. Pt continues to need mod to max facilitation for release and opening of hand.                 OT Education - 10/14/15 1057    Education provided Yes   Education Details incorporation of RUE into functional tasks   Person(s) Educated Patient;Other (comment)  [name]   Methods Explanation;Demonstration;Tactile cues;Verbal cues;Handout   Comprehension Verbalized understanding;Returned demonstration          OT Short Term Goals - 10/14/15 1058    OT SHORT TERM GOAL #1   Title Following facilitation,Patient will demonstrate active finger flexion through 30% available range as skill leading to pre grasp ability.  10/15/2015   Time 4   Period Weeks    Status Achieved   OT SHORT TERM GOAL #2   Title Patient will demonstrate active release of finger flexion to begin to release 2 inch or larger objects from right hand   Time 4   Period Weeks   Status Achieved   OT SHORT TERM GOAL #3   Title Patient will demonstrate ability to flex shoulder to 120 degrees in preparation for reaching into cabinet for shelf at eye level with right arm   Time 4   Period Weeks   Status Achieved   OT SHORT TERM GOAL #4   Title Patient will feed self with modified independence   Time 4   Period Weeks   Status Achieved   OT SHORT TERM GOAL #5   Title Patient will complete grooming with modified independence   Time 4   Period Weeks   Status Achieved           OT Long Term Goals - 10/14/15 1058    OT LONG TERM GOAL #1   Title Patient will grasp and release small object 1-3 inches with right hand with minimal cueing / facilitation.  11/12/2015   Time 12   Period Weeks   Status On-going   OT LONG TERM GOAL #2   Title Patient will reach into overhead shelf and retrieve a lightweight (1/2-1lb) object with minimal cueing / facilitation   Time 12   Period Weeks   Status On-going   OT LONG TERM GOAL #3   Title Patient will be able to safely don/doff splint for right hand, and show awareness of wearing schedule.   Time 12   Period Weeks   Status On-going   OT LONG TERM GOAL #4   Title Patient will be independent with a home exercise / home activity program   Time 12   Period Weeks   Status On-going   OT LONG TERM GOAL #5   Title Patient will carry light weight item e.g. laundry basket, across length of room, i.e. 20 feet.     Time 12   Period Weeks   Status On-going   OT LONG TERM GOAL #6   Title Patient will dress herself with modified independence   Status On-going               Plan - 10/14/15 1058    Clinical Impression Statement Pt has met all STG's and is progressing toward LTG's. Strongly encouraged pt to use RUE functionally  as much as possible.    Pt will benefit from skilled therapeutic intervention in order to improve on the following deficits (Retired) Decreased balance;Decreased endurance;Decreased  coordination;Decreased range of motion;Decreased safety awareness;Impaired perceived functional ability;Impaired vision/preception;Impaired UE functional use;Impaired tone;Impaired sensation   Rehab Potential Good   OT Frequency 2x / week   OT Duration 12 weeks   OT Treatment/Interventions Self-care/ADL training;Electrical Stimulation;Moist Heat;Fluidtherapy;Therapeutic exercise;Neuromuscular education;DME and/or AE instruction;Manual Therapy;Functional Mobility Training;Splinting;Therapeutic activities;Visual/perceptual remediation/compensation;Patient/family education;Balance training   Plan check functional use of RUE at home, modify HEP as needed, NMR for reach, grasp and release, functional use of the hand   Consulted and Agree with Plan of Care Patient   Family Member Consulted [name] - significant other of 15 years        Problem List Patient Active Problem List   Diagnosis Date Noted  . Essential hypertension, benign 07/18/2014  . Atrial fibrillation, unspecified 07/18/2014  . Anxiety state, unspecified 07/18/2014    Quay Burow, OTR/L 10/14/2015, 11:01 AM  [facility] [address] [place], [place], [address] Phone: [phone]   Fax:  [phone]  Name: Brenda Cunningham MRN: [id number] Date of Birth: [birth year]

## 2015-10-14 NOTE — Patient Instructions (Signed)
Pt and [name] instructed in numerous activities to incorporate RUE, as well as how to assist pt in opening her hand. Pt to drink using R hand at every meal as well as to incorporate into holding and carrying objects and as gross assist during basic ADL's.

## 2015-10-15 ENCOUNTER — Encounter: Payer: Self-pay | Admitting: Medical

## 2015-10-16 ENCOUNTER — Ambulatory Visit: Payer: Medicare Other | Admitting: Speech Pathology

## 2015-10-16 DIAGNOSIS — I639 Cerebral infarction, unspecified: Secondary | ICD-10-CM | POA: Diagnosis not present

## 2015-10-16 DIAGNOSIS — R4189 Other symptoms and signs involving cognitive functions and awareness: Secondary | ICD-10-CM | POA: Diagnosis not present

## 2015-10-16 DIAGNOSIS — G8110 Spastic hemiplegia affecting unspecified side: Secondary | ICD-10-CM | POA: Diagnosis not present

## 2015-10-16 DIAGNOSIS — I69359 Hemiplegia and hemiparesis following cerebral infarction affecting unspecified side: Secondary | ICD-10-CM | POA: Diagnosis not present

## 2015-10-16 DIAGNOSIS — IMO0002 Reserved for concepts with insufficient information to code with codable children: Secondary | ICD-10-CM

## 2015-10-16 DIAGNOSIS — H539 Unspecified visual disturbance: Secondary | ICD-10-CM | POA: Diagnosis not present

## 2015-10-16 DIAGNOSIS — R4701 Aphasia: Secondary | ICD-10-CM | POA: Diagnosis not present

## 2015-10-16 NOTE — Patient Instructions (Signed)
Homework packet provided

## 2015-10-16 NOTE — Therapy (Signed)
[facility] [address] [place], [place], [address] Phone: [phone]   Fax:  [phone]  Speech Language Pathology Treatment  Patient Details  Name: Brenda Cunningham MRN: [id number] Date of Birth: 26-Dec-1950 Referring Provider: Helane Rima  Encounter Date: 10/16/2015      End of Session - 10/16/15 1155    Visit Number 2   Number of Visits 17   Date for SLP Re-Evaluation 11/20/15   SLP Start Time 64   SLP Stop Time  1145   SLP Time Calculation (min) 43 min   Activity Tolerance Patient tolerated treatment well      Past Medical History  Diagnosis Date  . Hypertension   . Arthritis   . A-fib ([place])   . Hyperlipidemia   . Stroke ([place]) 2010  . Breast cancer ([place]) 2008    s/p lumpectomy, chemo and radiation therapy  . Wears glasses   . Anxiety     Past Surgical History  Procedure Laterality Date  . Breast lumpectomy  2008    left  . Appendectomy    . Tubal ligation    . Colonoscopy  2004    There were no vitals filed for this visit.  Visit Diagnosis: Combined receptive and expressive aphasia due to cerebrovascular accident (CVA) ([place])      Subjective Assessment - 10/16/15 1110    Subjective "I did n't have homework"   Currently in Pain? No/denies               ADULT SLP TREATMENT - 10/16/15 1111    General Information   Behavior/Cognition Alert;Cooperative;Pleasant mood   Treatment Provided   Treatment provided Cognitive-Linquistic   Pain Assessment   Pain Assessment No/denies pain   Cognitive-Linquistic Treatment   Treatment focused on Aphasia   Skilled Treatment Facilitated auditory comprehension with conversational questions, personal questions  with  85% accuracy and rare min repetitions. Facilitated auditory comprehensoin with convergent naming of anminals with 80% accuracy, slow rate of speech, occasional min repeptition. Pt required written and phonemic cues for word  finding.   Reading comprehsion with simple sentences with usual mod cues.    Assessment / Recommendations / Plan   Plan Continue with current plan of care   Progression Toward Goals   Progression toward goals Progressing toward goals          SLP Education - 10/16/15 1150    Education provided Yes   Education Details compensations for receptive aphasia   Person(s) Educated Patient   Methods Demonstration   Comprehension Verbalized understanding;Verbal cues required;Need further instruction          SLP Short Term Goals - 10/16/15 1153    SLP SHORT TERM GOAL #1   Title pt will generate appropriate language to participate in simple converastion with occasional  min A   Time 4   Period Weeks  or the first 8 sessions (for all STGs)   Status On-going   SLP SHORT TERM GOAL #2   Title pt will generate sentence level responses in structured tasks with rare min A   Time 4   Period Weeks   Status On-going   SLP SHORT TERM GOAL #3   Title pt will demo understanding of complex commands with extra time and rare min A   Time 4   Period Weeks   Status On-going   SLP SHORT TERM GOAL #4   Title demo understanding of longer verbal stimuli/mod complex conversation 90% with occasional min  A   Time 4   Period Weeks   Status On-going   SLP SHORT TERM GOAL #5   Title pt will attempt to use multimodal communicaiton when verbal communication unsuccessful/difficult    Time 4   Period Weeks   Status On-going          SLP Long Term Goals - 10/16/15 1155    SLP LONG TERM GOAL #1   Title pt will engage in simple to mod complex conversation functionally with modified independence (multimodal communication, compensations, extra time) with rare min A   Time 8   Period Weeks  (11-20-15)   Status On-going   SLP LONG TERM GOAL #2   Title pt will demo understanding of longer or mod complex verbal stimuli with modified independence   Time 8   Period Weeks   Status On-going   SLP LONG TERM  GOAL #3   Title pt will perform HEP for motor speech/planning with 80% success and compensations used   Time 8   Period Weeks   Status On-going          Plan - 10/16/15 1154    Clinical Impression Statement Significant improvement noted since evaluations 09/20/15. Auditory comprehension of conversation and simple convergent tasks facilitated with slow rate of speech, minimal repetitions and extended time. Conversational speech with paraphasias with mod cues to ID and correct errors. Continue skilled ST to maximize receptive and expressive language .         Problem List Patient Active Problem List   Diagnosis Date Noted  . Essential hypertension, benign 07/18/2014  . Atrial fibrillation, unspecified 07/18/2014  . Anxiety state, unspecified 07/18/2014    Raianna Slight, Annye Rusk MS, CCC-SLP 10/16/2015, 11:56 AM  [facility] [address] [place], [place], [address] Phone: [phone]   Fax:  [phone]   Name: Brenda Cunningham MRN: [id number] Date of Birth: 10-19-1951

## 2015-10-18 ENCOUNTER — Ambulatory Visit
Admission: RE | Admit: 2015-10-18 | Discharge: 2015-10-18 | Disposition: A | Discharge status: Home / Self Care | Payer: Medicare Other | Source: Ambulatory Visit

## 2015-10-18 DIAGNOSIS — Z1231 Encounter for screening mammogram for malignant neoplasm of breast: Secondary | ICD-10-CM

## 2015-10-20 ENCOUNTER — Other Ambulatory Visit: Payer: Self-pay | Admitting: Medical

## 2015-10-23 ENCOUNTER — Ambulatory Visit: Payer: Medicare Other | Admitting: Occupational Therapy

## 2015-10-23 ENCOUNTER — Ambulatory Visit: Payer: Medicare Other

## 2015-10-23 DIAGNOSIS — R4189 Other symptoms and signs involving cognitive functions and awareness: Secondary | ICD-10-CM | POA: Diagnosis not present

## 2015-10-23 DIAGNOSIS — H539 Unspecified visual disturbance: Secondary | ICD-10-CM | POA: Diagnosis not present

## 2015-10-23 DIAGNOSIS — I639 Cerebral infarction, unspecified: Secondary | ICD-10-CM | POA: Diagnosis not present

## 2015-10-23 DIAGNOSIS — I69359 Hemiplegia and hemiparesis following cerebral infarction affecting unspecified side: Secondary | ICD-10-CM

## 2015-10-23 DIAGNOSIS — G8110 Spastic hemiplegia affecting unspecified side: Secondary | ICD-10-CM

## 2015-10-23 DIAGNOSIS — IMO0002 Reserved for concepts with insufficient information to code with codable children: Secondary | ICD-10-CM

## 2015-10-23 DIAGNOSIS — R2681 Unsteadiness on feet: Secondary | ICD-10-CM

## 2015-10-23 DIAGNOSIS — R4701 Aphasia: Secondary | ICD-10-CM | POA: Diagnosis not present

## 2015-10-23 NOTE — Therapy (Signed)
[facility] [address] [place], [place], [address] Phone: [phone]   Fax:  [phone]  Occupational Therapy Treatment  Patient Details  Name: Brenda Cunningham MRN: [id number] Date of Birth: [birth year] Referring Provider: Reece Levy  Encounter Date: 10/23/2015      OT End of Session - 10/23/15 1053    Visit Number 9   Number of Visits 25   Date for OT Re-Evaluation 11/15/15   Authorization Type Medicare - pt will need G code and progress note every 10th visit   Authorization Time Period G code next Visit!!!   Authorization - Visit Number 9   Authorization - Number of Visits 10   OT Start Time 1018   OT Stop Time 1100   OT Time Calculation (min) 42 min   Activity Tolerance Patient tolerated treatment well   Behavior During Therapy WFL for tasks assessed/performed      Past Medical History  Diagnosis Date  . Hypertension   . Arthritis   . A-fib ([place])   . Hyperlipidemia   . Stroke ([place]) 2010  . Breast cancer ([place]) 2008    s/p lumpectomy, chemo and radiation therapy  . Wears glasses   . Anxiety     Past Surgical History  Procedure Laterality Date  . Breast lumpectomy  2008    left  . Appendectomy    . Tubal ligation    . Colonoscopy  2004    There were no vitals filed for this visit.  Visit Diagnosis:  Spastic hemiplegia affecting nondominant side (H[facility])  Visual disturbance  Hemiparesis affecting nondominant side as late effect of cerebrovascular accident ([place])  Unsteadiness      Subjective Assessment - 10/23/15 1022    Subjective  Pt fell this morning   Pertinent History see epic snapshot.  Pt with new stroke 8/16.  (pt also had stroke in 2010).     Patient Stated Goals pt points to hand and gestures she wants to use it   Currently in Pain? No/denies       Treatment: Neuro re-ed for hight level shoulder flexion bilaterally with PVC pipe frame, min facilitation at right elbow, x 10  reps,  Unilateral Right shoulder flexion and circumduction mid range with UE ranger 2 sets of 10 reps each min facilitation. Functional grasp release of water bottle and bringing to mouth, assisting with LUE, followed by grasp /release of wooden dowel  pegs with min- mod facilitation art right hand.                         OT Short Term Goals - 10/14/15 1058    OT SHORT TERM GOAL #1   Title Following facilitation,Patient will demonstrate active finger flexion through 30% available range as skill leading to pre grasp ability.  10/15/2015   Time 4   Period Weeks   Status Achieved   OT SHORT TERM GOAL #2   Title Patient will demonstrate active release of finger flexion to begin to release 2 inch or larger objects from right hand   Time 4   Period Weeks   Status Achieved   OT SHORT TERM GOAL #3   Title Patient will demonstrate ability to flex shoulder to 120 degrees in preparation for reaching into cabinet for shelf at eye level with right arm   Time 4   Period Weeks   Status Achieved   OT SHORT TERM GOAL #4   Title Patient will  feed self with modified independence   Time 4   Period Weeks   Status Achieved   OT SHORT TERM GOAL #5   Title Patient will complete grooming with modified independence   Time 4   Period Weeks   Status Achieved           OT Long Term Goals - 10/14/15 1058    OT LONG TERM GOAL #1   Title Patient will grasp and release small object 1-3 inches with right hand with minimal cueing / facilitation.  11/12/2015   Time 12   Period Weeks   Status On-going   OT LONG TERM GOAL #2   Title Patient will reach into overhead shelf and retrieve a lightweight (1/2-1lb) object with minimal cueing / facilitation   Time 12   Period Weeks   Status On-going   OT LONG TERM GOAL #3   Title Patient will be able to safely don/doff splint for right hand, and show awareness of wearing schedule.   Time 12   Period Weeks   Status On-going   OT LONG TERM  GOAL #4   Title Patient will be independent with a home exercise / home activity program   Time 12   Period Weeks   Status On-going   OT LONG TERM GOAL #5   Title Patient will carry light weight item e.g. laundry basket, across length of room, i.e. 20 feet.     Time 12   Period Weeks   Status On-going   OT LONG TERM GOAL #6   Title Patient will dress herself with modified independence   Status On-going               Plan - 10/23/15 1522    Clinical Impression Statement Pt is progresing towards goals. She demonstrates ability to incorportate RUE into functional activity with improving grasp/ release.   Pt will benefit from skilled therapeutic intervention in order to improve on the following deficits (Retired) Decreased balance;Decreased endurance;Decreased coordination;Decreased range of motion;Decreased safety awareness;Impaired perceived functional ability;Impaired vision/preception;Impaired UE functional use;Impaired tone;Impaired sensation   Rehab Potential Good   OT Frequency 2x / week   OT Duration 8 weeks   OT Treatment/Interventions Self-care/ADL training;Electrical Stimulation;Moist Heat;Fluidtherapy;Therapeutic exercise;Neuromuscular education;DME and/or AE instruction;Manual Therapy;Functional Mobility Training;Splinting;Therapeutic activities;Visual/perceptual remediation/compensation;Patient/family education;Balance training   Plan functional use of RUE, [place] re-ed for grasp / release   Consulted and Agree with Plan of Care Patient        Problem List Patient Active Problem List   Diagnosis Date Noted  . Essential hypertension, benign 07/18/2014  . Atrial fibrillation, unspecified 07/18/2014  . Anxiety state, unspecified 07/18/2014    RINE,KATHRYN 10/23/2015, 3:25 PM Theone Murdoch, OTR/L [phone] Phone: [phone] 3:25 PM 11/16/2016[facility] [address] [place], [place],  [address] Phone: [phone]   Fax:  [phone]  Name: Brenda Cunningham MRN: [id number] Date of Birth: [birth year]

## 2015-10-23 NOTE — Patient Instructions (Signed)
  Please complete the assigned speech therapy homework and return it to your next session.  

## 2015-10-23 NOTE — Therapy (Signed)
[facility] [address] [place], [place], [address] Phone: [phone]   Fax:  [phone]  Speech Language Pathology Treatment  Patient Details  Name: Brenda Cunningham MRN: [id number] Date of Birth: [birth year] Referring Provider: Helane Rima  Encounter Date: 10/23/2015      End of Session - 10/23/15 1626    Visit Number 3   Number of Visits 17   Date for SLP Re-Evaluation 11/20/15   SLP Start Time 0932   SLP Stop Time  1015   SLP Time Calculation (min) 43 min   Activity Tolerance Patient tolerated treatment well      Past Medical History  Diagnosis Date  . Hypertension   . Arthritis   . A-fib ([place])   . Hyperlipidemia   . Stroke ([place]) 2010  . Breast cancer ([place]) 2008    s/p lumpectomy, chemo and radiation therapy  . Wears glasses   . Anxiety     Past Surgical History  Procedure Laterality Date  . Breast lumpectomy  2008    left  . Appendectomy    . Tubal ligation    . Colonoscopy  2004    There were no vitals filed for this visit.  Visit Diagnosis: Combined receptive and expressive aphasia due to cerebrovascular accident (CVA) [facility])      Subjective Assessment - 10/23/15 0944    Subjective "I said - - 'Can i have a --dlass water pwease?' and my grandson - says - - - 'oooo, she said it!'"               ADULT SLP TREATMENT - 10/23/15 0945    General Information   Behavior/Cognition Alert;Cooperative;Pleasant mood   Treatment Provided   Treatment provided Cognitive-Linquistic   Pain Assessment   Pain Assessment No/denies pain   Cognitive-Linquistic Treatment   Treatment focused on Aphasia   Skilled Treatment Divergent naming tasks to target improved accuracy with expressive language - pt completed average 93% of responses functionally. In simple description tasks with pictures to improve verbal expression completed with consistent perseveration as well as phonemic paraphasias  consistently, requiring SLP mod cues occasionally. SLP noted that phonemic cues were most helpful to improve pt's erroneous articulation. In writing pt's name address, name 100% correct, address 75% accurate. SLP facilitated improved writing of address with focused practice for including all letters of street name in copying address. Pt wrote address 10 times in repetition fashion, but req'd mod-max cues to write spontaneously.   Assessment / Recommendations / Plan   Plan Continue with current plan of care   Progression Toward Goals   Progression toward goals Progressing toward goals            SLP Short Term Goals - 10/23/15 1637    SLP SHORT TERM GOAL #1   Title pt will generate appropriate language to participate in simple converastion with occasional  min A   Time 3   Period Weeks  or the first 8 sessions (for all STGs)   Status On-going   SLP SHORT TERM GOAL #2   Title pt will generate sentence level responses in structured tasks with rare min A   Time 3   Period Weeks   Status On-going   SLP SHORT TERM GOAL #3   Title pt will demo understanding of complex commands with extra time and rare min A   Time 3   Period Weeks   Status On-going   SLP SHORT TERM GOAL #4  Title demo understanding of longer verbal stimuli/mod complex conversation 90% with occasional min A   Time 3   Period Weeks   Status On-going   SLP SHORT TERM GOAL #5   Title pt will attempt to use multimodal communicaiton when verbal communication unsuccessful/difficult    Time 3   Period Weeks   Status On-going          SLP Long Term Goals - 10/23/15 [place] #1   Title pt will engage in simple to mod complex conversation functionally with modified independence (multimodal communication, compensations, extra time) with rare min A   Time 7   Period Weeks  (11-20-15)   Status On-going   SLP LONG TERM GOAL #2   Title pt will demo understanding of longer or mod complex verbal stimuli  with modified independence   Time 7   Period Weeks   Status On-going   SLP LONG TERM GOAL #3   Title pt will perform HEP for motor speech/planning with 80% success and compensations used   Time 7   Period Weeks   Status On-going          Plan - 10/23/15 1627    Clinical Impression Statement Pt with what appears to be expressive aphasia moreso than receptive aphasia. SLP stressed today a reduced rate of speech to allow more time for appropriate verbal expression by reducing paraphasias. Pt req'd SLP cues to do so. Skilled ST remains neeed to maximize langauge skills.   Speech Therapy Frequency 2x / week   Duration --  8 weeks total   Treatment/Interventions SLP instruction and feedback;Compensatory strategies;Internal/external aids;Patient/family education;Functional tasks;Cueing hierarchy;Multimodal communcation approach;Language facilitation   Potential to Achieve Goals Good   Potential Considerations Severity of impairments   Consulted and Agree with Plan of Care Patient        Problem List Patient Active Problem List   Diagnosis Date Noted  . Essential hypertension, benign 07/18/2014  . Atrial fibrillation, unspecified 07/18/2014  . Anxiety state, unspecified 07/18/2014    [facility] , [place], [place]  10/23/2015, 4:38 PM  [facility] [address] [place], [place], [address] Phone: [phone]   Fax:  [phone]   Name: Brenda Cunningham MRN: [id number] Date of Birth: 1951/03/17

## 2015-10-25 ENCOUNTER — Ambulatory Visit: Payer: Medicare Other | Admitting: Occupational Therapy

## 2015-10-25 ENCOUNTER — Ambulatory Visit: Payer: Medicare Other

## 2015-10-25 DIAGNOSIS — I69359 Hemiplegia and hemiparesis following cerebral infarction affecting unspecified side: Secondary | ICD-10-CM

## 2015-10-25 DIAGNOSIS — R4701 Aphasia: Secondary | ICD-10-CM | POA: Diagnosis not present

## 2015-10-25 DIAGNOSIS — I639 Cerebral infarction, unspecified: Secondary | ICD-10-CM | POA: Diagnosis not present

## 2015-10-25 DIAGNOSIS — G8110 Spastic hemiplegia affecting unspecified side: Secondary | ICD-10-CM

## 2015-10-25 DIAGNOSIS — H539 Unspecified visual disturbance: Secondary | ICD-10-CM

## 2015-10-25 DIAGNOSIS — R2681 Unsteadiness on feet: Secondary | ICD-10-CM

## 2015-10-25 DIAGNOSIS — R4189 Other symptoms and signs involving cognitive functions and awareness: Secondary | ICD-10-CM | POA: Diagnosis not present

## 2015-10-25 DIAGNOSIS — IMO0002 Reserved for concepts with insufficient information to code with codable children: Secondary | ICD-10-CM

## 2015-10-25 NOTE — Patient Instructions (Signed)
  Please complete the assigned speech therapy homework and return it to your next session.  

## 2015-10-25 NOTE — Therapy (Signed)
[facility] [address] [place], [place], [address] Phone: [phone]   Fax:  [phone]  Speech Language Pathology Treatment  Patient Details  Name: Brenda Cunningham MRN: [id number] Date of Birth: 06/02/1951 Referring Provider: Helane Rima  Encounter Date: 10/25/2015      End of Session - 10/25/15 0939    Visit Number 4   Number of Visits 17   Date for SLP Re-Evaluation 11/20/15   SLP Start Time 0937   SLP Stop Time  1017   SLP Time Calculation (min) 40 min   Activity Tolerance Patient tolerated treatment well      Past Medical History  Diagnosis Date  . Hypertension   . Arthritis   . A-fib ([place])   . Hyperlipidemia   . Stroke ([place]) 2010  . Breast cancer ([place]) 2008    s/p lumpectomy, chemo and radiation therapy  . Wears glasses   . Anxiety     Past Surgical History  Procedure Laterality Date  . Breast lumpectomy  2008    left  . Appendectomy    . Tubal ligation    . Colonoscopy  2004    There were no vitals filed for this visit.  Visit Diagnosis: Combined receptive and expressive aphasia due to cerebrovascular accident (CVA) [facility])      Subjective Assessment - 10/25/15 0943    Subjective Pt attempts to tell SLP about a medication change, without success and SLP mod-max cues.               ADULT SLP TREATMENT - 10/25/15 0944    General Information   Behavior/Cognition Alert;Cooperative;Pleasant mood   Treatment Provided   Treatment provided Cognitive-Linquistic   Pain Assessment   Pain Assessment No/denies pain   Cognitive-Linquistic Treatment   Treatment focused on Aphasia   Skilled Treatment Pt reading conversational sentences ("How are you?", "Where are you going?") functionally with <10% success. Pt repeated the same sentences functionally 80% success; 100% with x2-3 attempts. In reading rote phrases and opposites pt produced heavy perseverative language and decr'd error  awareness. Pt unable to convey message re: medication change since last session.   Assessment / Recommendations / Plan   Plan Continue with current plan of care   Progression Toward Goals   Progression toward goals Progressing toward goals            SLP Short Term Goals - 10/25/15 1138    SLP SHORT TERM GOAL #1   Title pt will generate appropriate language to participate in simple converastion functionally with occasional  min A   Time 3   Period Weeks  or the first 8 sessions (for all STGs)   Status Revised   SLP SHORT TERM GOAL #2   Title pt will generate sentence level responses in structured tasks with rare min A   Time 3   Period Weeks   Status On-going   SLP SHORT TERM GOAL #3   Title pt will demo understanding of complex commands with extra time and rare min A   Time 3   Period Weeks   Status On-going   SLP SHORT TERM GOAL #4   Title demo understanding of longer verbal stimuli/mod complex conversation 90% with occasional min A   Time 3   Period Weeks   Status On-going   SLP SHORT TERM GOAL #5   Title pt will attempt to use multimodal communicaiton when verbal communication unsuccessful/difficult    Time 3   Period  Weeks   Status On-going          SLP Long Term Goals - 10/23/15 1638    SLP LONG TERM GOAL #1   Title pt will engage in simple to mod complex conversation functionally with modified independence (multimodal communication, compensations, extra time) with rare min A   Time 7   Period Weeks  (11-20-15)   Status On-going   SLP LONG TERM GOAL #2   Title pt will demo understanding of longer or mod complex verbal stimuli with modified independence   Time 7   Period Weeks   Status On-going   SLP LONG TERM GOAL #3   Title pt will perform HEP for motor speech/planning with 80% success and compensations used   Time 7   Period Weeks   Status On-going          Plan - 10/25/15 1137    Clinical Impression Statement Pt with what appears to be  expressive aphasia moreso than receptive aphasia. SLP stressed today a reduced rate of speech to allow more time for appropriate verbal expression by reducing paraphasias. Pt req'd SLP cues to do so. Skilled ST remains neeed to maximize langauge skills.   Speech Therapy Frequency 2x / week   Duration --  8 weeks total (11-20-15)   Treatment/Interventions SLP instruction and feedback;Compensatory strategies;Internal/external aids;Patient/family education;Functional tasks;Cueing hierarchy;Multimodal communcation approach;Language facilitation   Potential to Achieve Goals Good   Potential Considerations Severity of impairments   Consulted and Agree with Plan of Care Patient        Problem List Patient Active Problem List   Diagnosis Date Noted  . Essential hypertension, benign 07/18/2014  . Atrial fibrillation, unspecified 07/18/2014  . Anxiety state, unspecified 07/18/2014    [facility] , [place], [place]  10/25/2015, 11:39 AM  [facility] [address] [place], [place], [address] Phone: [phone]   Fax:  [phone]   Name: Brenda Cunningham MRN: [id number] Date of Birth: 1950-12-19

## 2015-10-25 NOTE — Therapy (Signed)
[facility] [address] [place], [place], [address] Phone: [phone]   Fax:  [phone]  Occupational Therapy Treatment  Patient Details  Name: Brenda Cunningham MRN: [id number] Date of Birth: 07-25-1951 Referring Reeya Bound: Reece Levy  Encounter Date: 10/25/2015      OT End of Session - 10/25/15 1648    Visit Number 10   Number of Visits 25   Date for OT Re-Evaluation 11/15/15   Authorization Type Medicare - pt will need G code and progress note every 10th visit   Authorization Time Period G code next Visit!!!   Authorization - Visit Number 10   Authorization - Number of Visits 10      Past Medical History  Diagnosis Date  . Hypertension   . Arthritis   . A-fib ([place])   . Hyperlipidemia   . Stroke ([place]) 2010  . Breast cancer ([place]) 2008    s/p lumpectomy, chemo and radiation therapy  . Wears glasses   . Anxiety     Past Surgical History  Procedure Laterality Date  . Breast lumpectomy  2008    left  . Appendectomy    . Tubal ligation    . Colonoscopy  2004    There were no vitals filed for this visit.  Visit Diagnosis:  Spastic hemiplegia affecting nondominant side (HCC)  Visual disturbance  Hemiparesis affecting nondominant side as late effect of cerebrovascular accident ([place])  Unsteadiness      Subjective Assessment - 10/25/15 1022    Pertinent History see epic snapshot.  Pt with new stroke 8/16.  (pt also had stroke in 2010).     Patient Stated Goals pt points to hand and gestures she wants to use it   Currently in Pain? No/denies         Treatment: Neuro re-ed for high level shoulder flexion bilaterally with PVC pipe frame, min facilitation at right elbow, x 10 reps,   Unilateral Right shoulder flexion  mid range with UE ranger 2 sets of 10 reps each min facilitation. Functional grasp/ release of various sized pegs to remove from semicircle with min- mod facilitation to right  hand. Flipping playing cards with RUE with mod facillitation for increased functional use of RUE Pt attempted arm bike yet was unable to maintain grasp                                                  OT Short Term Goals - 10/14/15 1058    OT SHORT TERM GOAL #1   Title Following facilitation,Patient will demonstrate active finger flexion through 30% available range as skill leading to pre grasp ability.  10/15/2015   Time 4   Period Weeks   Status Achieved   OT SHORT TERM GOAL #2   Title Patient will demonstrate active release of finger flexion to begin to release 2 inch or larger objects from right hand   Time 4   Period Weeks   Status Achieved   OT SHORT TERM GOAL #3   Title Patient will demonstrate ability to flex shoulder to 120 degrees in preparation for reaching into cabinet for shelf at eye level with right arm   Time 4   Period Weeks   Status Achieved   OT SHORT TERM GOAL #4   Title Patient will feed self with modified independence  Time 4   Period Weeks   Status Achieved   OT SHORT TERM GOAL #5   Title Patient will complete grooming with modified independence   Time 4   Period Weeks   Status Achieved           OT Long Term Goals - 10/14/15 1058    OT LONG TERM GOAL #1   Title Patient will grasp and release small object 1-3 inches with right hand with minimal cueing / facilitation.  11/12/2015   Time 12   Period Weeks   Status On-going   OT LONG TERM GOAL #2   Title Patient will reach into overhead shelf and retrieve a lightweight (1/2-1lb) object with minimal cueing / facilitation   Time 12   Period Weeks   Status On-going   OT LONG TERM GOAL #3   Title Patient will be able to safely don/doff splint for right hand, and show awareness of wearing schedule.   Time 12   Period Weeks   Status On-going   OT LONG TERM GOAL #4   Title Patient will be independent with a home exercise / home activity program   Time 12    Period Weeks   Status On-going   OT LONG TERM GOAL #5   Title Patient will carry light weight item e.g. laundry basket, across length of room, i.e. 20 feet.     Time 12   Period Weeks   Status On-going   OT LONG TERM GOAL #6   Title Patient will dress herself with modified independence   Status On-going               Plan - 2015/11/20 1038    Clinical Impression Statement Pt is progressing towards goals for grasp/ release and RUE functional use.   Plan NMR RUE, functional use of RUE   Consulted and Agree with Plan of Care Patient          G-Codes - 11/20/15 1650    Functional Assessment Tool Used clinical observations and progress towards short term goals.   Functional Limitation Carrying, moving and handling objects   Carrying, Moving and Handling Objects Current Status [phone]) At least 60 percent but less than 80 percent impaired, limited or restricted   Carrying, Moving and Handling Objects Goal Status ([id number]) At least 40 percent but less than 60 percent impaired, limited or restricted      Problem List Patient Active Problem List   Diagnosis Date Noted  . Essential hypertension, benign 07/18/2014  . Atrial fibrillation, unspecified 07/18/2014  . Anxiety state, unspecified 07/18/2014  Occupational Therapy Progress Note  Dates of Reporting Period: 09/17/15 to 20-Nov-2015  Objective Reports of Subjective Statement: Pt is now able to feed herself modified independently.  Objective Measurements: See above progress towards short term goals. Pt demonstrates RUE shoulder flexion to 120*  Goal Update: Pt met all short term goals  Plan: Continue to work towards [place] are Required: Pt can benefit from skilled OT to address RUE strength and coordination to maximize independence with ADLs/IADLs.  RINE,KATHRYN 2015/11/20, 4:52 PM Theone Murdoch, OTR/L [phone] Phone: [phone] 4:52 PM 11/20/2015 [facility] [address] [place], [place], [address] Phone: [phone]   Fax:  [phone]  Name: Brenda Cunningham MRN: [id number] Date of Birth: 04-08-1951

## 2015-10-28 ENCOUNTER — Ambulatory Visit: Payer: Medicare Other | Admitting: Speech Pathology

## 2015-10-28 ENCOUNTER — Ambulatory Visit: Payer: Medicare Other | Admitting: Occupational Therapy

## 2015-10-28 ENCOUNTER — Encounter: Payer: Self-pay | Admitting: Occupational Therapy

## 2015-10-28 DIAGNOSIS — R449 Unspecified symptoms and signs involving general sensations and perceptions: Secondary | ICD-10-CM

## 2015-10-28 DIAGNOSIS — I639 Cerebral infarction, unspecified: Secondary | ICD-10-CM | POA: Diagnosis not present

## 2015-10-28 DIAGNOSIS — R4189 Other symptoms and signs involving cognitive functions and awareness: Secondary | ICD-10-CM

## 2015-10-28 DIAGNOSIS — I69359 Hemiplegia and hemiparesis following cerebral infarction affecting unspecified side: Secondary | ICD-10-CM | POA: Diagnosis not present

## 2015-10-28 DIAGNOSIS — H539 Unspecified visual disturbance: Secondary | ICD-10-CM | POA: Diagnosis not present

## 2015-10-28 DIAGNOSIS — G8110 Spastic hemiplegia affecting unspecified side: Secondary | ICD-10-CM | POA: Diagnosis not present

## 2015-10-28 DIAGNOSIS — IMO0002 Reserved for concepts with insufficient information to code with codable children: Secondary | ICD-10-CM

## 2015-10-28 DIAGNOSIS — R4701 Aphasia: Secondary | ICD-10-CM | POA: Diagnosis not present

## 2015-10-28 NOTE — Patient Instructions (Signed)
Written expression

## 2015-10-28 NOTE — Therapy (Signed)
[facility] [address] [place], [place], [address] Phone: [phone]   Fax:  [phone]  Occupational Therapy Treatment  Patient Details  Name: Brenda Cunningham MRN: [id number] Date of Birth: [birth year] Referring Provider: Reece Levy  Encounter Date: 10/28/2015      OT End of Session - 10/28/15 1158    Visit Number 11   Number of Visits 25   Date for OT Re-Evaluation 11/15/15   Authorization Type Medicare - pt will need G code and progress note every 10th visit   Authorization Time Period G code visit 20 (and progress note)   Authorization - Visit Number 11   Authorization - Number of Visits 20   OT Start Time 1103   OT Stop Time 1146   OT Time Calculation (min) 43 min   Activity Tolerance Patient tolerated treatment well   Behavior During Therapy [facility] for tasks assessed/performed      Past Medical History  Diagnosis Date  . Hypertension   . Arthritis   . A-fib ([place])   . Hyperlipidemia   . Stroke ([place]) 2010  . Breast cancer ([place]) 2008    s/p lumpectomy, chemo and radiation therapy  . Wears glasses   . Anxiety     Past Surgical History  Procedure Laterality Date  . Breast lumpectomy  2008    left  . Appendectomy    . Tubal ligation    . Colonoscopy  2004    There were no vitals filed for this visit.  Visit Diagnosis:  Spastic hemiplegia affecting nondominant side (HCC)  Hemiparesis affecting nondominant side as late effect of cerebrovascular accident [facility])  Sensory deficit, right      Subjective Assessment - 10/28/15 1110    Subjective  My fingers are getting tight   Pertinent History see epic snapshot.  Pt with new stroke 8/16.  (pt also had stroke in 2010).     Patient Stated Goals pt points to hand and gestures she wants to use it   Currently in Pain? No/denies                      OT Treatments/Exercises (OP) - 10/28/15 0001    Neurological Re-education Exercises    Other Exercises 1 Neuromuscular reeducation to address increased tightness in hand today, specifically 3rd and 4th digits.  Worked on isolated wrist extension, radial and ulnar deviation and composite digit extension.  Patient with severe sensory deficit, and does best when interacting with an object to get feedback regarding motor performance.  Worked with water in styrofoam cup, light weight cones,, small wooden blocks, and pen in hand.  Patient able to get visual feedback to determine performance with these objects.  Worked on reach pattern where hand moves toward object via shoulder flexion and elbow extension versus tipping head and body forward or downward.                  OT Education - 10/28/15 1158    Education provided Yes   Education Details reach pattern in right arm   Person(s) Educated Patient   Methods Demonstration;Explanation   Comprehension Need further instruction          OT Short Term Goals - 10/14/15 1058    OT SHORT TERM GOAL #1   Title Following facilitation,Patient will demonstrate active finger flexion through 30% available range as skill leading to pre grasp ability.  10/15/2015   Time 4   Period [place]  Status Achieved   OT SHORT TERM GOAL #2   Title Patient will demonstrate active release of finger flexion to begin to release 2 inch or larger objects from right hand   Time 4   Period Weeks   Status Achieved   OT SHORT TERM GOAL #3   Title Patient will demonstrate ability to flex shoulder to 120 degrees in preparation for reaching into cabinet for shelf at eye level with right arm   Time 4   Period Weeks   Status Achieved   OT SHORT TERM GOAL #4   Title Patient will feed self with modified independence   Time 4   Period Weeks   Status Achieved   OT SHORT TERM GOAL #5   Title Patient will complete grooming with modified independence   Time 4   Period Weeks   Status Achieved           OT Long Term Goals - 10/14/15 1058    OT LONG  TERM GOAL #1   Title Patient will grasp and release small object 1-3 inches with right hand with minimal cueing / facilitation.  11/12/2015   Time 12   Period Weeks   Status On-going   OT LONG TERM GOAL #2   Title Patient will reach into overhead shelf and retrieve a lightweight (1/2-1lb) object with minimal cueing / facilitation   Time 12   Period Weeks   Status On-going   OT LONG TERM GOAL #3   Title Patient will be able to safely don/doff splint for right hand, and show awareness of wearing schedule.   Time 12   Period Weeks   Status On-going   OT LONG TERM GOAL #4   Title Patient will be independent with a home exercise / home activity program   Time 12   Period Weeks   Status On-going   OT LONG TERM GOAL #5   Title Patient will carry light weight item e.g. laundry basket, across length of room, i.e. 20 feet.     Time 12   Period Weeks   Status On-going   OT LONG TERM GOAL #6   Title Patient will dress herself with modified independence   Status On-going               Plan - 10/28/15 1159    Clinical Impression Statement Patient is showing steady improvement during therapy sessions, yet is limited by increasing muscle tone, severe sensory loss, and learned non use.   Pt will benefit from skilled therapeutic intervention in order to improve on the following deficits (Retired) Decreased balance;Decreased endurance;Decreased coordination;Decreased range of motion;Decreased safety awareness;Impaired perceived functional ability;Impaired vision/preception;Impaired UE functional use;Impaired tone;Impaired sensation   OT Frequency 2x / week   OT Duration 8 weeks   OT Treatment/Interventions Self-care/ADL training;Electrical Stimulation;Moist Heat;Fluidtherapy;Therapeutic exercise;Neuromuscular education;DME and/or AE instruction;Manual Therapy;Functional Mobility Training;Splinting;Therapeutic activities;Visual/perceptual remediation/compensation;Patient/family  education;Balance training   Plan NMR RUE, Functional use - maybe some forced use concepts   Consulted and Agree with Plan of Care Patient        Problem List Patient Active Problem List   Diagnosis Date Noted  . Essential hypertension, benign 07/18/2014  . Atrial fibrillation, unspecified 07/18/2014  . Anxiety state, unspecified 07/18/2014    Mariah Milling, OTR/L 10/28/2015, 12:02 PM  [facility] [address] [place], [place], [address] Phone: [phone]   Fax:  [phone]  Name: Brenda Cunningham MRN: [id number] Date of Birth: Jan 04, 1951

## 2015-10-28 NOTE — Therapy (Signed)
[facility] [address] [place], [place], [address] Phone: [phone]   Fax:  [phone]  Speech Language Pathology Treatment  Patient Details  Name: Brenda Cunningham MRN: [id number] Date of Birth: Aug 28, 1951 Referring Provider: Helane Rima  Encounter Date: 10/28/2015      End of Session - 10/28/15 1101    Visit Number 5   Number of Visits 17   Date for SLP Re-Evaluation 11/20/15   SLP Start Time 1017   SLP Stop Time  1101   SLP Time Calculation (min) 44 min   Activity Tolerance Patient tolerated treatment well      Past Medical History  Diagnosis Date  . Hypertension   . Arthritis   . A-fib ([place])   . Hyperlipidemia   . Stroke ([place]) 2010  . Breast cancer ([place]) 2008    s/p lumpectomy, chemo and radiation therapy  . Wears glasses   . Anxiety     Past Surgical History  Procedure Laterality Date  . Breast lumpectomy  2008    left  . Appendectomy    . Tubal ligation    . Colonoscopy  2004    There were no vitals filed for this visit.  Visit Diagnosis: Combined receptive and expressive aphasia due to cerebrovascular accident (CVA) [facility])      Subjective Assessment - 10/28/15 1023    Subjective "I left the homework on my TV"  "My speech is coming along"   Currently in Pain? No/denies               ADULT SLP TREATMENT - 10/28/15 1023    General Information   Behavior/Cognition Alert;Cooperative;Pleasant mood   Treatment Provided   Treatment provided Cognitive-Linquistic   Pain Assessment   Pain Assessment No/denies pain   Cognitive-Linquistic Treatment   Treatment focused on Aphasia   Skilled Treatment Facilitated speech production and sentence generation having pt read multisyllabic words aloud and generate sentence words - pt benefitted from repeating multisyllabic word after SLP,  sentence generation with usual mod A for syntax and to ID paraphsias. Pt read simple conversational  phrases with 75% accuracy and occasional min cues to ID and correct errors of paraphasia. Pt wrote biographical info with 85% accuracy and occasional cues for next letter - pt wrote letters to dictation with rare min A.    Assessment / Recommendations / Plan   Plan Continue with current plan of care   Progression Toward Goals   Progression toward goals Progressing toward goals            SLP Short Term Goals - 10/28/15 1101    SLP SHORT TERM GOAL #1   Title pt will generate appropriate language to participate in simple converastion functionally with occasional  min A   Time 3   Period Weeks  or the first 8 sessions (for all STGs)   Status Revised   SLP SHORT TERM GOAL #2   Title pt will generate sentence level responses in structured tasks with rare min A   Time 3   Period Weeks   Status On-going   SLP SHORT TERM GOAL #3   Title pt will demo understanding of complex commands with extra time and rare min A   Time 3   Period Weeks   Status On-going   SLP SHORT TERM GOAL #4   Title demo understanding of longer verbal stimuli/mod complex conversation 90% with occasional min A   Time 3   Period Weeks  Status On-going   SLP SHORT TERM GOAL #5   Title pt will attempt to use multimodal communicaiton when verbal communication unsuccessful/difficult    Time 3   Period Weeks   Status On-going          SLP Long Term Goals - 10/28/15 1101    SLP LONG TERM GOAL #1   Title pt will engage in simple to mod complex conversation functionally with modified independence (multimodal communication, compensations, extra time) with rare min A   Time 7   Period Weeks  (11-20-15)   Status On-going   SLP LONG TERM GOAL #2   Title pt will demo understanding of longer or mod complex verbal stimuli with modified independence   Time 7   Period Weeks   Status On-going   SLP LONG TERM GOAL #3   Title pt will perform HEP for motor speech/planning with 80% success and compensations used   Time  7   Period Weeks   Status On-going          Plan - 10/28/15 1059    Clinical Impression Statement Pt with improved written expression of biographical information and oral reading with occasional min A. Simple conversation with continued paraphasias and anomic episodes. Continue skilled ST to maixmize lanuage skilles.   Speech Therapy Frequency 2x / week   Treatment/Interventions SLP instruction and feedback;Compensatory strategies;Internal/external aids;Patient/family education;Functional tasks;Cueing hierarchy;Multimodal communcation approach;Language facilitation   Potential Considerations Severity of impairments   Consulted and Agree with Plan of Care Patient        Problem List Patient Active Problem List   Diagnosis Date Noted  . Essential hypertension, benign 07/18/2014  . Atrial fibrillation, unspecified 07/18/2014  . Anxiety state, unspecified 07/18/2014    Lovvorn, Annye Rusk MS, CCC-SLP 10/28/2015, 11:03 AM  [facility] [address] [place], [place], [address] Phone: [phone]   Fax:  [phone]   Name: Brenda Cunningham MRN: [id number] Date of Birth: [birth year]

## 2015-10-29 DIAGNOSIS — Z6829 Body mass index (BMI) 29.0-29.9, adult: Secondary | ICD-10-CM | POA: Diagnosis not present

## 2015-10-29 DIAGNOSIS — Z01419 Encounter for gynecological examination (general) (routine) without abnormal findings: Secondary | ICD-10-CM | POA: Diagnosis not present

## 2015-10-30 ENCOUNTER — Encounter: Payer: Self-pay | Admitting: Occupational Therapy

## 2015-10-30 ENCOUNTER — Ambulatory Visit: Payer: Medicare Other | Admitting: Occupational Therapy

## 2015-10-30 DIAGNOSIS — I69359 Hemiplegia and hemiparesis following cerebral infarction affecting unspecified side: Secondary | ICD-10-CM

## 2015-10-30 DIAGNOSIS — R449 Unspecified symptoms and signs involving general sensations and perceptions: Secondary | ICD-10-CM

## 2015-10-30 DIAGNOSIS — R4701 Aphasia: Secondary | ICD-10-CM | POA: Diagnosis not present

## 2015-10-30 DIAGNOSIS — R4189 Other symptoms and signs involving cognitive functions and awareness: Secondary | ICD-10-CM

## 2015-10-30 DIAGNOSIS — I639 Cerebral infarction, unspecified: Secondary | ICD-10-CM | POA: Diagnosis not present

## 2015-10-30 DIAGNOSIS — G8110 Spastic hemiplegia affecting unspecified side: Secondary | ICD-10-CM | POA: Diagnosis not present

## 2015-10-30 DIAGNOSIS — H539 Unspecified visual disturbance: Secondary | ICD-10-CM | POA: Diagnosis not present

## 2015-10-30 NOTE — Therapy (Signed)
[facility] [address] [place], [place], [address] Phone: [phone]   Fax:  [phone]  Occupational Therapy Treatment  Patient Details  Name: Brenda Cunningham MRN: [id number] Date of Birth: February 10, 1951 Referring Provider: Reece Levy  Encounter Date: 10/30/2015      OT End of Session - 10/30/15 1226    Visit Number 12   Number of Visits 25   Date for OT Re-Evaluation 11/15/15   Authorization Type Medicare - pt will need G code and progress note every 10th visit   Authorization Time Period G code visit 20 (and progress note)   Authorization - Visit Number 12   Authorization - Number of Visits 20   OT Start Time 1100   OT Stop Time 1150   OT Time Calculation (min) 50 min   Activity Tolerance Patient tolerated treatment well   Behavior During Therapy [facility] for tasks assessed/performed      Past Medical History  Diagnosis Date  . Hypertension   . Arthritis   . A-fib ([place])   . Hyperlipidemia   . Stroke ([place]) 2010  . Breast cancer ([place]) 2008    s/p lumpectomy, chemo and radiation therapy  . Wears glasses   . Anxiety     Past Surgical History  Procedure Laterality Date  . Breast lumpectomy  2008    left  . Appendectomy    . Tubal ligation    . Colonoscopy  2004    There were no vitals filed for this visit.  Visit Diagnosis:  Spastic hemiplegia affecting nondominant side (HCC)  Hemiparesis affecting nondominant side as late effect of cerebrovascular accident [facility])  Sensory deficit, right      Subjective Assessment - 10/30/15 1215    Subjective  Patient indicates she has been soaking hand in warm water and following with stretch - much less tight this am.     Pertinent History see epic snapshot.  Pt with new stroke 8/16.  (pt also had stroke in 2010).     Patient Stated Goals pt points to hand and gestures she wants to use it   Currently in Pain? No/denies                       OT Treatments/Exercises (OP) - 10/30/15 0001    ADLs   UB Dressing Patient needed min assistance to remove front opening outer coat, worn over sweater - tighter fitting than usual.     Cooking Worked with patient in functional setting this am with emphasis on finding tasks that right UE could do without help, or with very little help.  Generated list of tasks that patient able to complete with right UE.  Stressed this point to patient that use of right hand will help right hand be more useful.  Patient quickly developed improved pinch, release, and reach pattern with increased functional use.  Baking muffins - using right hand to hold measuring cup, push milk further back into refrigerator, open, close refrigerator door, open / close high cupboards, pull light objects forward on shelves, assisted carry of bowls, measuring cup from counter top to sink.  Patient initially hesitant to use right hand, but quickly understood, and playfully tucked left hand behind back to force use of right hand.     Home Maintenance Patient able to carry laundry basket with two hands, and pick up from floor, set on top iof dryer.  Patient indicates that she is doing some laundry currently  at home.     Splinting   Splinting Splint checked to see if any modifications may be necessary to accomodate increased tension in digits, specifically third and fourth.  Patient reports that warm water soaks and stretching has been effective to reduce tone in right hand, and therefore no further adjustments recommended.                  OT Education - 10/30/15 1225    Education provided Yes   Education Details Use of right arm for function - hand placement for varipus tasks - drawer pulls, cupboard handles, machine dials, etc.    Person(s) Educated Patient   Methods Explanation;Demonstration   Comprehension Returned demonstration;Tactile cues required;Need further instruction          OT Short Term Goals - 10/14/15 1058     OT SHORT TERM GOAL #1   Title Following facilitation,Patient will demonstrate active finger flexion through 30% available range as skill leading to pre grasp ability.  10/15/2015   Time 4   Period Weeks   Status Achieved   OT SHORT TERM GOAL #2   Title Patient will demonstrate active release of finger flexion to begin to release 2 inch or larger objects from right hand   Time 4   Period Weeks   Status Achieved   OT SHORT TERM GOAL #3   Title Patient will demonstrate ability to flex shoulder to 120 degrees in preparation for reaching into cabinet for shelf at eye level with right arm   Time 4   Period Weeks   Status Achieved   OT SHORT TERM GOAL #4   Title Patient will feed self with modified independence   Time 4   Period Weeks   Status Achieved   OT SHORT TERM GOAL #5   Title Patient will complete grooming with modified independence   Time 4   Period Weeks   Status Achieved           OT Long Term Goals - 10/30/15 1228    OT LONG TERM GOAL #1   Title Patient will grasp and release small object 1-3 inches with right hand with minimal cueing / facilitation.  11/12/2015   Status On-going   OT LONG TERM GOAL #2   Title Patient will reach into overhead shelf and retrieve a lightweight (1/2-1lb) object with minimal cueing / facilitation   Status On-going   OT LONG TERM GOAL #3   Title Patient will be able to safely don/doff splint for right hand, and show awareness of wearing schedule.   OT LONG TERM GOAL #4   Status On-going   OT LONG TERM GOAL #5   Title Patient will carry light weight item e.g. laundry basket, across length of room, i.e. 20 feet.     Status Achieved   OT LONG TERM GOAL #6   Title Patient will dress herself with modified independence   Status On-going               Plan - 10/30/15 1227    Clinical Impression Statement Patient is progressing toward OT goals, yet is somewhat liimited by sensory loss, learned non use, and increase muscle tone in  right hand.     Pt will benefit from skilled therapeutic intervention in order to improve on the following deficits (Retired) Decreased balance;Decreased endurance;Decreased coordination;Decreased range of motion;Decreased safety awareness;Impaired perceived functional ability;Impaired vision/preception;Impaired UE functional use;Impaired tone;Impaired sensation   Rehab Potential Good   OT Frequency 2x /  week   OT Duration 8 weeks   OT Treatment/Interventions Self-care/ADL training;Electrical Stimulation;Moist Heat;Fluidtherapy;Therapeutic exercise;Neuromuscular education;DME and/or AE instruction;Manual Therapy;Functional Mobility Training;Splinting;Therapeutic activities;Visual/perceptual remediation/compensation;Patient/family education;Balance training   Plan NMR RUE, forced use in functional situations   Consulted and Agree with Plan of Care Patient        Problem List Patient Active Problem List   Diagnosis Date Noted  . Essential hypertension, benign 07/18/2014  . Atrial fibrillation, unspecified 07/18/2014  . Anxiety state, unspecified 07/18/2014    Mariah Milling, OTR/L 10/30/2015, 12:30 PM  [facility] [address] [place], [place], [address] Phone: [phone]   Fax:  [phone]  Name: Brenda Cunningham MRN: [id number] Date of Birth: 1950-12-18

## 2015-11-04 ENCOUNTER — Encounter: Payer: Self-pay | Admitting: Occupational Therapy

## 2015-11-04 ENCOUNTER — Ambulatory Visit: Payer: Medicare Other

## 2015-11-04 ENCOUNTER — Ambulatory Visit: Payer: Medicare Other | Admitting: Occupational Therapy

## 2015-11-04 DIAGNOSIS — I639 Cerebral infarction, unspecified: Secondary | ICD-10-CM | POA: Diagnosis not present

## 2015-11-04 DIAGNOSIS — I69359 Hemiplegia and hemiparesis following cerebral infarction affecting unspecified side: Secondary | ICD-10-CM

## 2015-11-04 DIAGNOSIS — H539 Unspecified visual disturbance: Secondary | ICD-10-CM

## 2015-11-04 DIAGNOSIS — R449 Unspecified symptoms and signs involving general sensations and perceptions: Secondary | ICD-10-CM

## 2015-11-04 DIAGNOSIS — G8110 Spastic hemiplegia affecting unspecified side: Secondary | ICD-10-CM | POA: Diagnosis not present

## 2015-11-04 DIAGNOSIS — IMO0002 Reserved for concepts with insufficient information to code with codable children: Secondary | ICD-10-CM

## 2015-11-04 DIAGNOSIS — R4189 Other symptoms and signs involving cognitive functions and awareness: Secondary | ICD-10-CM | POA: Diagnosis not present

## 2015-11-04 DIAGNOSIS — R4701 Aphasia: Secondary | ICD-10-CM | POA: Diagnosis not present

## 2015-11-04 NOTE — Therapy (Signed)
[facility] [address] [place], [place], [address] Phone: [phone]   Fax:  [phone]  Occupational Therapy Treatment  Patient Details  Name: Brenda Cunningham MRN: [id number] Date of Birth: [birth year] Referring Provider: Reece Levy  Encounter Date: 11/04/2015      OT End of Session - 11/04/15 1304    Visit Number 13   Number of Visits 25   Date for OT Re-Evaluation 11/15/15   Authorization Type Medicare - pt will need G code and progress note every 10th visit   Authorization Time Period G code visit 20 (and progress note)   Authorization - Visit Number 13   Authorization - Number of Visits 20   OT Start Time 1016   OT Stop Time 1100   OT Time Calculation (min) 44 min   Activity Tolerance Patient tolerated treatment well      Past Medical History  Diagnosis Date  . Hypertension   . Arthritis   . A-fib ([place])   . Hyperlipidemia   . Stroke ([place]) 2010  . Breast cancer ([place]) 2008    s/p lumpectomy, chemo and radiation therapy  . Wears glasses   . Anxiety     Past Surgical History  Procedure Laterality Date  . Breast lumpectomy  2008    left  . Appendectomy    . Tubal ligation    . Colonoscopy  2004    There were no vitals filed for this visit.  Visit Diagnosis:  Hemiparesis affecting nondominant side as late effect of cerebrovascular accident [facility])  Sensory deficit, right  Visual disturbance      Subjective Assessment - 11/04/15 1022    Subjective  "my hand" - pt pointing to hand to indicate swelling   Pertinent History see epic snapshot.  Pt with new stroke 8/16.  (pt also had stroke in 2010).     Patient Stated Goals pt points to hand and gestures she wants to use it   Currently in Pain? No/denies                      OT Treatments/Exercises (OP) - 11/04/15 0001    Neurological Re-education Exercises   Other Exercises 1 Neuro re ed to address grasp and release as well  as attemtping to open fingers to pick up small objects. Pt initially required max assist to open fingers to grasp object but with functional task and repetition then only required mod facilittaion. Pt does not readily incorporate RUE into function due to severe sensory impairment and possible apraxia. Pt readily engages RUE in therapy sessions but requires max encouragement to use RUE at home functionally. Incorporated overhead reach into grasp and relase of small objects.    Manual Therapy   Manual Therapy Edema management   Manual therapy comments Pt arrived today with moderate edema in R hand. Pt tolerated aggressive edema massage with good results. Discussed with pt need to use and move R hand to keep swelling and stiffness to a miimum. Pt with improved use of R hand following edema massage                  OT Short Term Goals - 11/04/15 1300    OT SHORT TERM GOAL #1   Title Following facilitation,Patient will demonstrate active finger flexion through 30% available range as skill leading to pre grasp ability.  10/15/2015   Time 4   Period Weeks   Status Achieved   OT SHORT  TERM GOAL #2   Title Patient will demonstrate active release of finger flexion to begin to release 2 inch or larger objects from right hand   Time 4   Period Weeks   Status Achieved   OT SHORT TERM GOAL #3   Title Patient will demonstrate ability to flex shoulder to 120 degrees in preparation for reaching into cabinet for shelf at eye level with right arm   Time 4   Period Weeks   Status Achieved   OT SHORT TERM GOAL #4   Title Patient will feed self with modified independence   Time 4   Period Weeks   Status Achieved   OT SHORT TERM GOAL #5   Title Patient will complete grooming with modified independence   Time 4   Period Weeks   Status Achieved           OT Long Term Goals - 11/04/15 1300    OT LONG TERM GOAL #1   Title Patient will grasp and release small object 1-3 inches with right hand  with minimal cueing / facilitation.  11/12/2015   Status Achieved   OT LONG TERM GOAL #2   Title Patient will reach into overhead shelf and retrieve a lightweight (1/2-1lb) object with minimal cueing / facilitation   Status On-going   OT LONG TERM GOAL #3   Title Patient will be able to safely don/doff splint for right hand, and show awareness of wearing schedule.   Status Achieved   OT LONG TERM GOAL #4   Title Patient will be independent with a home exercise / home activity program   Status On-going   OT LONG TERM GOAL #5   Title Patient will carry light weight item e.g. laundry basket, across length of room, i.e. 20 feet.     Status Achieved   OT LONG TERM GOAL #6   Title Patient will dress herself with modified independence   Status On-going               Plan - 11/04/15 1301    Clinical Impression Statement Pt progressing slowly toward goals.  Pt with improved use of RUE in clinic - needs encouragement to use functionally at home due to impaired sensation, possible apraxia.    Rehab Potential Good   Clinical Impairments Affecting Rehab Potential impaired sensatoin, possible apraxia   OT Frequency 2x / week   OT Duration 8 weeks   OT Treatment/Interventions Electrical Stimulation;Moist Heat;Fluidtherapy;Therapeutic exercise;Neuromuscular education;DME and/or AE instruction;Manual Therapy;Functional Mobility Training;Splinting;Therapeutic activities;Visual/perceptual remediation/compensation;Patient/family education;Balance training   Plan NMR RUE, forced use in functional task. possible upgrade to HEP   Consulted and Agree with Plan of Care Patient        Problem List Patient Active Problem List   Diagnosis Date Noted  . Essential hypertension, benign 07/18/2014  . Atrial fibrillation, unspecified 07/18/2014  . Anxiety state, unspecified 07/18/2014    Quay Burow, OTR/L 11/04/2015, 1:06 PM  [facility] [address] [place], [place], [address] Phone: [phone]   Fax:  [phone]  Name: Brenda Cunningham MRN: [id number] Date of Birth: [birth year]

## 2015-11-04 NOTE — Therapy (Signed)
[facility] [address] [place], [place], [address] Phone: [phone]   Fax:  [phone]  Speech Language Pathology Treatment  Patient Details  Name: Brenda Cunningham MRN: [id number] Date of Birth: 1951/09/13 Referring Provider: Helane Rima  Encounter Date: 11/04/2015      End of Session - 11/04/15 1153    Visit Number 6   Number of Visits 17   Date for SLP Re-Evaluation 11/20/15   SLP Start Time 0932   SLP Stop Time  1015   SLP Time Calculation (min) 43 min      Past Medical History  Diagnosis Date  . Hypertension   . Arthritis   . A-fib ([place])   . Hyperlipidemia   . Stroke ([place]) 2010  . Breast cancer ([place]) 2008    s/p lumpectomy, chemo and radiation therapy  . Wears glasses   . Anxiety     Past Surgical History  Procedure Laterality Date  . Breast lumpectomy  2008    left  . Appendectomy    . Tubal ligation    . Colonoscopy  2004    There were no vitals filed for this visit.  Visit Diagnosis: Combined receptive and expressive aphasia due to cerebrovascular accident (CVA) [facility])      Subjective Assessment - 11/04/15 0939    Subjective "How -- was your Thanksgiving?"               ADULT SLP TREATMENT - 11/04/15 0940    General Information   Behavior/Cognition Alert;Cooperative;Pleasant mood   Treatment Provided   Treatment provided Cognitive-Linquistic   Cognitive-Linquistic Treatment   Treatment focused on Aphasia   Skilled Treatment "The green boggis" (pt's response for where her family was for Thanksgiving, with no awareness of error). SLP targeted expressive and receptive language with sentence responses with simple stimuli. Pt req'd cues for all directions today, usually. Expressively, she req'd SLP mod-max cues usually for providing correct situation for something overheard (example: "How short would you like me to cut it?"). In sentence descriptions for objects (f:3), pt did  not require cues from SLP. With SLP sentence description of scenes (f:3) pt was 95% successful. In sentence completion tasks with initial written cues pt req'd min-mod SLP cues occasionally.   Assessment / Recommendations / Plan   Plan Continue with current plan of care   Progression Toward Goals   Progression toward goals Progressing toward goals          SLP Education - 11/04/15 1016    Education provided Yes   Education Details speech compensation - slowing rate   Person(s) Educated Patient   Methods Explanation;Demonstration;Verbal cues   Comprehension Verbal cues required;Verbalized understanding;Returned demonstration;Need further instruction          SLP Short Term Goals - 11/04/15 1156    SLP SHORT TERM GOAL #1   Title pt will generate appropriate language to participate in simple converastion functionally with occasional  min A   Time 2   Period Weeks  or the first 8 sessions (for all STGs)   Status Revised   SLP SHORT TERM GOAL #2   Title pt will generate sentence level responses in structured tasks with rare min A   Time 2   Period Weeks   Status On-going   SLP SHORT TERM GOAL #3   Title pt will demo understanding of complex commands with extra time and rare min A   Time 2   Period Weeks   Status  On-going   SLP SHORT TERM GOAL #4   Title demo understanding of longer verbal stimuli/mod complex conversation 90% with occasional min A   Time 2   Period Weeks   Status On-going   SLP SHORT TERM GOAL #5   Title pt will attempt to use multimodal communicaiton when verbal communication unsuccessful/difficult    Time 2   Period Weeks   Status On-going          SLP Long Term Goals - 11/04/15 1156    SLP LONG TERM GOAL #1   Title pt will engage in simple to mod complex conversation functionally with modified independence (multimodal communication, compensations, extra time) with rare min A   Time 6   Period Weeks  (11-20-15)   Status On-going   SLP LONG TERM  GOAL #2   Title pt will demo understanding of longer or mod complex verbal stimuli with modified independence   Time 6   Period Weeks   Status On-going   SLP LONG TERM GOAL #3   Title pt will perform HEP for motor speech/planning with 80% success and compensations used   Time 6   Period Weeks   Status On-going          Plan - 11/04/15 1153    Clinical Impression Statement Pt remains unaware of epressive errors, mostly in sentence and conversation - pt notably surprised when SLP repeated verbatim her errored responses to her x3 today.   Speech Therapy Frequency 2x / week   Duration --  8 weeks total (11-20-15)   Treatment/Interventions SLP instruction and feedback;Compensatory strategies;Internal/external aids;Patient/family education;Functional tasks;Cueing hierarchy;Multimodal communcation approach;Language facilitation   Potential to Achieve Goals Good   Potential Considerations Severity of impairments        Problem List Patient Active Problem List   Diagnosis Date Noted  . Essential hypertension, benign 07/18/2014  . Atrial fibrillation, unspecified 07/18/2014  . Anxiety state, unspecified 07/18/2014    [facility] , [place], [place]  11/04/2015, 11:57 AM  [facility] [address] [place], [place], [address] Phone: [phone]   Fax:  [phone]   Name: Brenda Cunningham MRN: [id number] Date of Birth: January 20, 1951

## 2015-11-04 NOTE — Patient Instructions (Signed)
   Slowing down helps your talking.

## 2015-11-06 ENCOUNTER — Encounter: Payer: Self-pay | Admitting: Occupational Therapy

## 2015-11-06 ENCOUNTER — Ambulatory Visit: Payer: Medicare Other

## 2015-11-06 ENCOUNTER — Ambulatory Visit: Payer: Medicare Other | Admitting: Occupational Therapy

## 2015-11-06 DIAGNOSIS — IMO0002 Reserved for concepts with insufficient information to code with codable children: Secondary | ICD-10-CM

## 2015-11-06 DIAGNOSIS — I69359 Hemiplegia and hemiparesis following cerebral infarction affecting unspecified side: Secondary | ICD-10-CM | POA: Diagnosis not present

## 2015-11-06 DIAGNOSIS — R4189 Other symptoms and signs involving cognitive functions and awareness: Secondary | ICD-10-CM | POA: Diagnosis not present

## 2015-11-06 DIAGNOSIS — R4701 Aphasia: Secondary | ICD-10-CM | POA: Diagnosis not present

## 2015-11-06 DIAGNOSIS — H539 Unspecified visual disturbance: Secondary | ICD-10-CM | POA: Diagnosis not present

## 2015-11-06 DIAGNOSIS — I639 Cerebral infarction, unspecified: Secondary | ICD-10-CM | POA: Diagnosis not present

## 2015-11-06 DIAGNOSIS — R449 Unspecified symptoms and signs involving general sensations and perceptions: Secondary | ICD-10-CM

## 2015-11-06 DIAGNOSIS — G8110 Spastic hemiplegia affecting unspecified side: Secondary | ICD-10-CM | POA: Diagnosis not present

## 2015-11-06 NOTE — Therapy (Signed)
[facility] [address] [place], [place], [address] Phone: [phone]   Fax:  [phone]  Speech Language Pathology Treatment  Patient Details  Name: Brenda Cunningham MRN: [id number] Date of Birth: 1951-07-19 Referring Provider: Helane Rima  Encounter Date: 11/06/2015      End of Session - 11/06/15 1046    Visit Number 7   Number of Visits 17   Date for SLP Re-Evaluation 11/20/15   SLP Start Time 0932   SLP Stop Time  1015   SLP Time Calculation (min) 43 min   Activity Tolerance Patient tolerated treatment well      Past Medical History  Diagnosis Date  . Hypertension   . Arthritis   . A-fib ([place])   . Hyperlipidemia   . Stroke ([place]) 2010  . Breast cancer ([place]) 2008    s/p lumpectomy, chemo and radiation therapy  . Wears glasses   . Anxiety     Past Surgical History  Procedure Laterality Date  . Breast lumpectomy  2008    left  . Appendectomy    . Tubal ligation    . Colonoscopy  2004    There were no vitals filed for this visit.  Visit Diagnosis: Combined receptive and expressive aphasia due to cerebrovascular accident (CVA) [facility])             ADULT SLP TREATMENT - 11/06/15 1006    General Information   Behavior/Cognition Alert;Cooperative;Pleasant mood;Confused   Treatment Provided   Treatment provided Cognitive-Linquistic   Pain Assessment   Pain Assessment No/denies pain   Cognitive-Linquistic Treatment   Treatment focused on Aphasia   Skilled Treatment Reviewed homework as pt did not comprehend written instructions for some of it. She req'd consistent mod-max cues from SLP to read words aloud accurately, and to demo understanding of simple word problems. In cloze sentences, pt completed 100% correct, and WFL without SLP careful listening. In sentence completion tasks, pt req'd occasional min A for semantics.   Assessment / Recommendations / Plan   Plan Continue with current  plan of care   Progression Toward Goals   Progression toward goals Progressing toward goals            SLP Short Term Goals - 11/06/15 1052    SLP SHORT TERM GOAL #1   Title pt will generate appropriate language to participate in simple converastion functionally with occasional  min A   Time 2   Period Weeks  or the first 8 sessions (for all STGs)   Status Revised   SLP SHORT TERM GOAL #2   Title pt will generate sentence level responses in structured tasks with occasional min A   Time 2   Period Weeks   Status Revised   SLP SHORT TERM GOAL #3   Title pt will demo understanding of complex commands with extra time and rare min A   Time 2   Period Weeks   Status On-going   SLP SHORT TERM GOAL #4   Title demo understanding of longer verbal stimuli/mod complex conversation 80% with occasional min A   Time 2   Period Weeks   Status Revised   SLP SHORT TERM GOAL #5   Title pt will attempt to use multimodal communicaiton when verbal communication unsuccessful/difficult    Time 2   Period Weeks   Status On-going          SLP Long Term Goals - 11/06/15 1054    SLP LONG TERM  GOAL #1   Title pt will engage in simple to mod complex conversation functionally with modified independence (multimodal communication, compensations, extra time) with rare min A   Time 6   Period Weeks  (11-20-15)   Status On-going   SLP LONG TERM GOAL #2   Title pt will demo understanding of longer or mod complex verbal stimuli with modified independence   Time 6   Period Weeks   Status On-going   SLP LONG TERM GOAL #3   Title pt will perform HEP for motor speech/planning with 80% success and compensations used   Time 6   Period Weeks   Status On-going   SLP LONG TERM GOAL #4   Title pt will demo understanding of 3-4 sentence paragraph level written information with modified independence   Time 6   Period Weeks   Status New          Plan - 11/06/15 1051    Clinical Impression  Statement Pt remains unaware of epressive errors, mostly in sentence and conversation. Skilled ST remains needed to maximize expressive and receptive language to improve pt's QOL.   Speech Therapy Frequency 2x / week   Duration --  8 weeks total (11-20-15)   Treatment/Interventions SLP instruction and feedback;Compensatory strategies;Internal/external aids;Patient/family education;Functional tasks;Cueing hierarchy;Multimodal communcation approach;Language facilitation   Potential to Achieve Goals Good   Potential Considerations Severity of impairments        Problem List Patient Active Problem List   Diagnosis Date Noted  . Essential hypertension, benign 07/18/2014  . Atrial fibrillation, unspecified 07/18/2014  . Anxiety state, unspecified 07/18/2014    [facility] , [place], [place]  11/06/2015, 10:56 AM  [facility] [address] [place], [place], [address] Phone: [phone]   Fax:  [phone]   Name: Brenda Cunningham MRN: [id number] Date of Birth: Aug 28, 1951

## 2015-11-06 NOTE — Patient Instructions (Signed)
  Please complete the assigned speech therapy homework and return it to your next session.  

## 2015-11-06 NOTE — Therapy (Signed)
[facility] [address] [place], [place], [address] Phone: [phone]   Fax:  [phone]  Occupational Therapy Treatment  Patient Details  Name: Brenda Cunningham MRN: [id number] Date of Birth: 1951-01-14 Referring Provider: Reece Levy  Encounter Date: 11/06/2015      OT End of Session - 11/06/15 1110    Visit Number 14   Number of Visits 25   Date for OT Re-Evaluation 11/15/15   Authorization Type Medicare - pt will need G code and progress note every 10th visit   Authorization - Visit Number 14   Authorization - Number of Visits 20   OT Start Time 1016   OT Stop Time 1100   OT Time Calculation (min) 44 min   Activity Tolerance Patient tolerated treatment well   Behavior During Therapy [facility] for tasks assessed/performed      Past Medical History  Diagnosis Date  . Hypertension   . Arthritis   . A-fib ([place])   . Hyperlipidemia   . Stroke ([place]) 2010  . Breast cancer ([place]) 2008    s/p lumpectomy, chemo and radiation therapy  . Wears glasses   . Anxiety     Past Surgical History  Procedure Laterality Date  . Breast lumpectomy  2008    left  . Appendectomy    . Tubal ligation    . Colonoscopy  2004    There were no vitals filed for this visit.  Visit Diagnosis:  Sensory deficit, right  Hemiparesis affecting nondominant side as late effect of cerebrovascular accident [facility])  Spastic hemiplegia affecting nondominant side (HCC)      Subjective Assessment - 11/06/15 1021    Subjective  I am feeling better today and yesterday.  I was down.  I gotta do something - help people.   Pertinent History see epic snapshot.  Pt with new stroke 8/16.  (pt also had stroke in 2010).     Patient Stated Goals pt points to hand and gestures she wants to use it   Currently in Pain? No/denies                      OT Treatments/Exercises (OP) - 11/06/15 0001    Neurological Re-education Exercises   Other Exercises 1 Neuro reeducation to address forearm , wrist and digit function for reach, grasp and release.  Patient did well today with low reach with emphasis on isolated wrist extension, and radial/ulnar deviation.  Patient able to grasp large wooden pegs, leading with wrist extension and deemphasizing spacstic finger flexion.  With wrist extension as the emphasis- patient able to release pegs without difficulty and this improved further with repetition.  Patient with increased tightness in wrist, completed seated long arm weight bearing with emphasis on stretching - wrist. digits, elbow and shoulder out of flexor synergy pattern.  Patient rarely reports discomfort, but had limitations in these new ranges, so focus was on slow, pain free weight shifting.                  OT Education - 11/06/15 1109    Education provided Yes   Education Details de-emphasized finger flexion to pick up smaller objects   Person(s) Educated Patient   Methods Explanation;Demonstration;Tactile cues;Verbal cues   Comprehension Verbal cues required;Tactile cues required;Need further instruction          OT Short Term Goals - 11/04/15 1300    OT SHORT TERM GOAL #1   Title Following facilitation,Patient  will demonstrate active finger flexion through 30% available range as skill leading to pre grasp ability.  10/15/2015   Time 4   Period Weeks   Status Achieved   OT SHORT TERM GOAL #2   Title Patient will demonstrate active release of finger flexion to begin to release 2 inch or larger objects from right hand   Time 4   Period Weeks   Status Achieved   OT SHORT TERM GOAL #3   Title Patient will demonstrate ability to flex shoulder to 120 degrees in preparation for reaching into cabinet for shelf at eye level with right arm   Time 4   Period Weeks   Status Achieved   OT SHORT TERM GOAL #4   Title Patient will feed self with modified independence   Time 4   Period Weeks   Status Achieved   OT  SHORT TERM GOAL #5   Title Patient will complete grooming with modified independence   Time 4   Period Weeks   Status Achieved           OT Long Term Goals - 11/04/15 1300    OT LONG TERM GOAL #1   Title Patient will grasp and release small object 1-3 inches with right hand with minimal cueing / facilitation.  11/12/2015   Status Achieved   OT LONG TERM GOAL #2   Title Patient will reach into overhead shelf and retrieve a lightweight (1/2-1lb) object with minimal cueing / facilitation   Status On-going   OT LONG TERM GOAL #3   Title Patient will be able to safely don/doff splint for right hand, and show awareness of wearing schedule.   Status Achieved   OT LONG TERM GOAL #4   Title Patient will be independent with a home exercise / home activity program   Status On-going   OT LONG TERM GOAL #5   Title Patient will carry light weight item e.g. laundry basket, across length of room, i.e. 20 feet.     Status Achieved   OT LONG TERM GOAL #6   Title Patient will dress herself with modified independence   Status On-going               Plan - 11/06/15 1110    Clinical Impression Statement Patient progressing toward long term goals. although progress is slow due to severity of sensory and perceptual issues for controlled, coordinated movement.   Pt will benefit from skilled therapeutic intervention in order to improve on the following deficits (Retired) Decreased balance;Decreased endurance;Decreased coordination;Decreased range of motion;Decreased safety awareness;Impaired perceived functional ability;Impaired vision/preception;Impaired UE functional use;Impaired tone;Impaired sensation   Rehab Potential Good   Clinical Impairments Affecting Rehab Potential impaired sensatoin, possible apraxia   OT Frequency 2x / week   OT Duration 8 weeks   OT Treatment/Interventions Electrical Stimulation;Moist Heat;Fluidtherapy;Therapeutic exercise;Neuromuscular education;DME and/or AE  instruction;Manual Therapy;Functional Mobility Training;Splinting;Therapeutic activities;Visual/perceptual remediation/compensation;Patient/family education;Balance training   Plan NMR RUE, forced use, possible upgrade to HEP   Consulted and Agree with Plan of Care Patient        Problem List Patient Active Problem List   Diagnosis Date Noted  . Essential hypertension, benign 07/18/2014  . Atrial fibrillation, unspecified 07/18/2014  . Anxiety state, unspecified 07/18/2014    Mariah Milling, OTR/L 11/06/2015, 11:13 AM  [facility] [address] [place], [place], [address] Phone: [phone]   Fax:  [phone]  Name: Brenda Cunningham MRN: [id number] Date of Birth: 1951-05-13

## 2015-11-11 ENCOUNTER — Encounter: Payer: Self-pay | Admitting: Occupational Therapy

## 2015-11-11 ENCOUNTER — Ambulatory Visit: Payer: Medicare Other | Admitting: Occupational Therapy

## 2015-11-11 ENCOUNTER — Ambulatory Visit: Payer: Medicare Other | Attending: Family Medicine

## 2015-11-11 DIAGNOSIS — R482 Apraxia: Secondary | ICD-10-CM | POA: Insufficient documentation

## 2015-11-11 DIAGNOSIS — R4189 Other symptoms and signs involving cognitive functions and awareness: Secondary | ICD-10-CM | POA: Insufficient documentation

## 2015-11-11 DIAGNOSIS — I639 Cerebral infarction, unspecified: Secondary | ICD-10-CM | POA: Insufficient documentation

## 2015-11-11 DIAGNOSIS — H539 Unspecified visual disturbance: Secondary | ICD-10-CM | POA: Diagnosis not present

## 2015-11-11 DIAGNOSIS — G8110 Spastic hemiplegia affecting unspecified side: Secondary | ICD-10-CM | POA: Insufficient documentation

## 2015-11-11 DIAGNOSIS — R449 Unspecified symptoms and signs involving general sensations and perceptions: Secondary | ICD-10-CM

## 2015-11-11 DIAGNOSIS — R4701 Aphasia: Secondary | ICD-10-CM | POA: Insufficient documentation

## 2015-11-11 DIAGNOSIS — IMO0002 Reserved for concepts with insufficient information to code with codable children: Secondary | ICD-10-CM

## 2015-11-11 NOTE — Patient Instructions (Addendum)
Home Program for right hand:  1. Try and actively close your right hand as much as you can. Then use your left hand to help close the fingers into a tight fist.  Then relax the hand. 2. Then try and actively open you right hand as much as possible. You will need to use your left hand to open and straighten the fingers on your right hand.  Straighten them all the way but do this GENTLY - do not pull roughly on your fingers.  Do 10 repetitions, 4 times per day (breakfast, lunch, dinner and bedtime). DO THIS EVERY SINGLE DAY!!!  It is critical for Brenda Cunningham to do this because when she doesn't the hand gets stiff and swollen.   3. USE THE HAND FUNCTIONALLY!! Open drawers with it, hold things with it while you manipulate things with your left hand, open and close cabinets with it, use to pick up and put down light things, use it when you bathe and dress, use it carry things two handed. USE IT, USE IT, USE IT!! This is the biggest thing you can do to make this hand work!!  4.  Take 30 minutes two times and day and work on these activities: Sit at table. Place a plate on top of a shoe box.  Practice picking up a ball and placing it on the plate. Then pick up the ball off the plate and place it back on the table. You may need to use your left hand to help put the ball into your right hand. Do lots of repetition as this helps your hand learn.  You can also do this with the spool of thread as this is smaller and teaches your hand to adjust to different sized objects.

## 2015-11-11 NOTE — Therapy (Signed)
[facility] [address] [place], [place], [address] Phone: [phone]   Fax:  [phone]  Occupational Therapy Treatment  Patient Details  Name: Brenda Cunningham MRN: [id number] Date of Birth: 01-17-1951 Referring Provider: Reece Levy  Encounter Date: 11/11/2015      OT End of Session - 11/11/15 1307    Visit Number 15   Number of Visits 25   Date for OT Re-Evaluation 12/09/15   Authorization Type Medicare - pt will need G code and progress note every 10th visit   Authorization - Visit Number 15   Authorization - Number of Visits 20   OT Start Time 1017   OT Stop Time 1059   OT Time Calculation (min) 42 min   Activity Tolerance Patient tolerated treatment well      Past Medical History  Diagnosis Date  . Hypertension   . Arthritis   . A-fib ([place])   . Hyperlipidemia   . Stroke ([place]) 2010  . Breast cancer ([place]) 2008    s/p lumpectomy, chemo and radiation therapy  . Wears glasses   . Anxiety     Past Surgical History  Procedure Laterality Date  . Breast lumpectomy  2008    left  . Appendectomy    . Tubal ligation    . Colonoscopy  2004    There were no vitals filed for this visit.  Visit Diagnosis:  Sensory deficit, right - Plan: Ot plan of care cert/re-cert  Spastic hemiplegia affecting nondominant side ([place]) - Plan: Ot plan of care cert/re-cert  Visual disturbance - Plan: Ot plan of care cert/re-cert      Subjective Assessment - 11/11/15 1019    Subjective  Oh I know! (when cued to use her R hand during task)   Pertinent History see epic snapshot.  Pt with new stroke 8/16.  (pt also had stroke in 2010).     Patient Stated Goals pt points to hand and gestures she wants to use it   Currently in Pain? No/denies                      OT Treatments/Exercises (OP) - 11/11/15 0001    Neurological Re-education Exercises   Other Exercises 1 Neuro re ed to address development of  more concrete HEP. Pt has very poor ability to carry over progress from clinic to functional use of R hand at home due to language deficts, cognitve deficts, severe sensory impairment, apraxia, Pt given much more structured home program and provided in writing. Pt agreed she would share it with signifcant other, [name]. Practived ssactivities with signifcant repetition to ensure that pt could consistently replicate. See pt instructions for details.                 OT Education - 11/11/15 1256    Education provided Yes   Education Details HEP focused on specific activities to try and encourage more carry over.    Person(s) Educated Patient   Methods Explanation;Demonstration;Tactile cues;Verbal cues;Handout   Comprehension Verbalized understanding;Returned demonstration  pt to share witten instructions with [name]          OT Short Term Goals - 11/11/15 1258    OT SHORT TERM GOAL #1   Title Following facilitation,Patient will demonstrate active finger flexion through 30% available range as skill leading to pre grasp ability.  10/15/2015   Time 4   Period Weeks   Status Achieved   OT SHORT TERM GOAL #  2   Title Patient will demonstrate active release of finger flexion to begin to release 2 inch or larger objects from right hand   Time 4   Period Weeks   Status Achieved   OT SHORT TERM GOAL #3   Title Patient will demonstrate ability to flex shoulder to 120 degrees in preparation for reaching into cabinet for shelf at eye level with right arm   Time 4   Period Weeks   Status Achieved   OT SHORT TERM GOAL #4   Title Patient will feed self with modified independence   Time 4   Period Weeks   Status Achieved   OT SHORT TERM GOAL #5   Title Patient will complete grooming with modified independence   Time 4   Period Weeks   Status Achieved           OT Long Term Goals - 11/11/15 1304    OT LONG TERM GOAL #1   Title Patient will grasp and release small object 1-3 inches  with right hand with minimal cueing / facilitation.  11/12/2015   Status Achieved   OT LONG TERM GOAL #2   Title Patient will reach into overhead shelf and retrieve a lightweight (1/2-1lb) object with minimal cueing / facilitation - 12/09/2015 (carried over for renewal)   Status On-going   OT LONG TERM GOAL #3   Title Patient will be able to safely don/doff splint for right hand, and show awareness of wearing schedule.   Status Achieved   OT LONG TERM GOAL #4   Title Patient will be independent with a home exercise / home activity program - 12/09/2015 (carried over for renewal)   Status On-going   OT LONG TERM GOAL #5   Title Patient will carry light weight item e.g. laundry basket, across length of room, i.e. 20 feet.     Status Achieved   OT LONG TERM GOAL #6   Title Patient will dress herself with modified independence   Status Achieved               Plan - 11/11/15 1306    Clinical Impression Statement Pt has met all STG's and all but 2 LTG's.  Will renew pt today to allow additional time to meet remaining 2 goals.    Pt will benefit from skilled therapeutic intervention in order to improve on the following deficits (Retired) Decreased balance;Decreased endurance;Decreased coordination;Decreased range of motion;Decreased safety awareness;Impaired perceived functional ability;Impaired vision/preception;Impaired UE functional use;Impaired tone;Impaired sensation   Rehab Potential Good   Clinical Impairments Affecting Rehab Potential impaired sensatoin, possible apraxia   OT Frequency 2x / week   OT Duration 8 weeks   OT Treatment/Interventions Electrical Stimulation;Moist Heat;Fluidtherapy;Therapeutic exercise;Neuromuscular education;DME and/or AE instruction;Manual Therapy;Functional Mobility Training;Splinting;Therapeutic activities;Visual/perceptual remediation/compensation;Patient/family education;Balance training   Plan check HEP, NMR, forced use, functional use.    Consulted  and Agree with Plan of Care Patient        Problem List Patient Active Problem List   Diagnosis Date Noted  . Essential hypertension, benign 07/18/2014  . Atrial fibrillation, unspecified 07/18/2014  . Anxiety state, unspecified 07/18/2014    Quay Burow, OTR/L 11/11/2015, 1:14 PM  [facility] [address] [place], [place], [address] Phone: [phone]   Fax:  [phone]  Name: Brenda Cunningham MRN: [id number] Date of Birth: Jan 19, 1951

## 2015-11-11 NOTE — Patient Instructions (Signed)
  Please complete the assigned speech therapy homework and return it to your next session.  

## 2015-11-12 NOTE — Therapy (Signed)
[facility] [address] [place], [place], [address] Phone: [phone]   Fax:  [phone]  Speech Language Pathology Treatment  Patient Details  Name: Brenda Cunningham MRN: [id number] Date of Birth: 05/14/1951 Referring Provider: Helane Rima  Encounter Date: 11/11/2015      End of Session - 11/11/15 1019    Visit Number 8   Number of Visits 17   Date for SLP Re-Evaluation 11/20/15   SLP Start Time 0933   SLP Stop Time  [id number]   SLP Time Calculation (min) 42 min   Activity Tolerance Patient tolerated treatment well      Past Medical History  Diagnosis Date  . Hypertension   . Arthritis   . A-fib ([place])   . Hyperlipidemia   . Stroke ([place]) 2010  . Breast cancer ([place]) 2008    s/p lumpectomy, chemo and radiation therapy  . Wears glasses   . Anxiety     Past Surgical History  Procedure Laterality Date  . Breast lumpectomy  2008    left  . Appendectomy    . Tubal ligation    . Colonoscopy  2004    There were no vitals filed for this visit.  Visit Diagnosis: Combined receptive and expressive aphasia due to cerebrovascular accident (CVA) [facility])      Subjective Assessment - 11/11/15 0940    Subjective "I was reading." (re: SLP, "How was your weekend?")               ADULT SLP TREATMENT - 11/11/15 0941    General Information   Behavior/Cognition Alert;Cooperative;Pleasant mood;Confused   Treatment Provided   Treatment provided Cognitive-Linquistic   Pain Assessment   Pain Assessment No/denies pain   Cognitive-Linquistic Treatment   Treatment focused on Apraxia   Skilled Treatment SLP facilitated pt's expressive language (verbal) by targeting articulatory accuracy in object naming (3 object needed to complete a task) with mod A  usually. In sentence completion (cloze) pt 50% successful. Aphasic errors Cunningham'd 75% when pt read stimulus sentence and this hindered her ability to provide appropriate  response.    Assessment / Recommendations / Plan   Plan Continue with current plan of care   Progression Toward Goals   Progression toward goals Progressing toward goals            SLP Short Term Goals - 11/11/15 1021    SLP SHORT TERM GOAL #1   Title pt will generate appropriate language to participate in simple converastion functionally with occasional  min A   Time 2   Period Weeks  or the first 8 sessions (for all STGs)   Status Revised   SLP SHORT TERM GOAL #2   Title pt will generate sentence level responses in structured tasks with occasional min A   Time 2   Period Weeks   Status Revised   SLP SHORT TERM GOAL #3   Title pt will Cunningham understanding of complex commands with extra time and rare min A   Time 2   Period Weeks   Status On-going   SLP SHORT TERM GOAL #4   Title Cunningham understanding of longer verbal stimuli/mod complex conversation 80% with occasional min A   Time 2   Period Weeks   Status Revised   SLP SHORT TERM GOAL #5   Title pt will attempt to use multimodal communicaiton when verbal communication unsuccessful/difficult    Time 2   Period Weeks   Status On-going  SLP Long Term Goals - 11/11/15 1720    SLP LONG TERM GOAL #1   Title pt will engage in simple to mod complex conversation functionally with modified independence (multimodal communication, compensations, extra time) with rare min A   Time 6   Period Weeks  (11-20-15)   Status On-going   SLP LONG TERM GOAL #2   Title pt will Cunningham understanding of longer or mod complex verbal stimuli with modified independence   Time 6   Period Weeks   Status On-going   SLP LONG TERM GOAL #3   Title pt will perform HEP for motor speech/planning with 80% success and compensations used   Time 6   Period Weeks   Status On-going   SLP LONG TERM GOAL #4   Title pt will Cunningham understanding of 3-4 sentence paragraph level written information with modified independence   Time 6   Period Weeks    Status New          Plan - 11/11/15 1020    Clinical Impression Statement Pt unaware of approx 50% epressive errors when reading simple sentences, hinders comprehension. Skilled ST remains needed to maximize expressive and receptive language to improve pt's QOL.   Speech Therapy Frequency 2x / week   Duration --  8 weeks total (11-20-15)   Treatment/Interventions SLP instruction and feedback;Compensatory strategies;Internal/external aids;Patient/family education;Functional tasks;Cueing hierarchy;Multimodal communcation approach;Language facilitation   Potential to Achieve Goals Good   Potential Considerations Severity of impairments        Problem List Patient Active Problem List   Diagnosis Date Noted  . Essential hypertension, benign 07/18/2014  . Atrial fibrillation, unspecified 07/18/2014  . Anxiety state, unspecified 07/18/2014    [facility] , [place], [place]  11/12/2015, 5:21 PM  [facility] [address] [place], [place], [address] Phone: [phone]   Fax:  [phone]   Name: Rotasha Gill MRN: [id number] Date of Birth: September 22, 1951

## 2015-11-13 ENCOUNTER — Ambulatory Visit: Payer: Medicare Other

## 2015-11-13 ENCOUNTER — Ambulatory Visit: Payer: Medicare Other | Admitting: Occupational Therapy

## 2015-11-18 ENCOUNTER — Encounter: Payer: Self-pay | Admitting: Occupational Therapy

## 2015-11-18 ENCOUNTER — Ambulatory Visit: Payer: Medicare Other | Admitting: Occupational Therapy

## 2015-11-18 VITALS — BP 104/70 | HR 70

## 2015-11-18 DIAGNOSIS — G8110 Spastic hemiplegia affecting unspecified side: Secondary | ICD-10-CM | POA: Diagnosis not present

## 2015-11-18 DIAGNOSIS — R482 Apraxia: Secondary | ICD-10-CM | POA: Diagnosis not present

## 2015-11-18 DIAGNOSIS — R4189 Other symptoms and signs involving cognitive functions and awareness: Secondary | ICD-10-CM | POA: Diagnosis not present

## 2015-11-18 DIAGNOSIS — H539 Unspecified visual disturbance: Secondary | ICD-10-CM | POA: Diagnosis not present

## 2015-11-18 DIAGNOSIS — R449 Unspecified symptoms and signs involving general sensations and perceptions: Secondary | ICD-10-CM

## 2015-11-18 DIAGNOSIS — R4701 Aphasia: Secondary | ICD-10-CM | POA: Diagnosis not present

## 2015-11-18 DIAGNOSIS — I639 Cerebral infarction, unspecified: Secondary | ICD-10-CM | POA: Diagnosis not present

## 2015-11-18 NOTE — Therapy (Signed)
[facility] [address] [place], [place], [address] Phone: [phone]   Fax:  [phone]  Occupational Therapy Treatment  Patient Details  Name: Brenda Cunningham MRN: [id number] Date of Birth: [birth year] Referring Provider: Reece Levy  Encounter Date: 11/18/2015      OT End of Session - 11/18/15 0957    Visit Number 16   Number of Visits 25   Date for OT Re-Evaluation 01/06/16   Authorization Type Medicare - pt will need G code and progress note every 10th visit   Authorization Time Period G code visit 85 (and progress note)   Authorization - Visit Number 16   Authorization - Number of Visits 20   OT Start Time 0845   OT Stop Time 0945   OT Time Calculation (min) 60 min   Activity Tolerance Patient tolerated treatment well      Past Medical History  Diagnosis Date  . Hypertension   . Arthritis   . A-fib ([facility])   . Hyperlipidemia   . Stroke ([place]) 2010  . Breast cancer ([facility]) 2008    s/p lumpectomy, chemo and radiation therapy  . Wears glasses   . Anxiety     Past Surgical History  Procedure Laterality Date  . Breast lumpectomy  2008    left  . Appendectomy    . Tubal ligation    . Colonoscopy  2004    Filed Vitals:   11/18/15 0857  BP: 104/70  Pulse: 70    Visit Diagnosis:  Sensory deficit, right  Spastic hemiplegia affecting nondominant side (HCC)  Visual disturbance      Subjective Assessment - 11/18/15 0853    Subjective  I can't sleep - this is new.   Pertinent History see epic snapshot.  Pt with new stroke 8/16.  (pt also had stroke in 2010).     Patient Stated Goals pt points to hand and gestures she wants to use it   Currently in Pain? No/denies                      OT Treatments/Exercises (OP) - 11/18/15 0001    Neurological Re-education Exercises   Other Exercises 1 Neuro re ed to address functional use of R hand with emphasis on grasp and release including  mid level reach after estim. Pt with initially carry over and demonstrates active wrist extension and beginnning finger extension especially right after estim;  unfortunately tone returns and impedes use. Discussed possible botox injection with both pt and significant other, [name]. [name] to call and make appt with Dr. Aretta Nip and email sent to inform his office. Pt with obvious compliance with updated home program as right hand was not as stiff/tight and swelling was sigifincantly decreased. Pt also actively attempting to use her R hand functionally today without cues.    Modalities   Modalities Retail buyer Location right fingers and wrist   Electrical Stimulation Action wrist and finger extension   Electrical Stimulation Parameters 250 pps, 50 pulse, 21 intensity, 10 second aon/off, 2.5 ramp, Pt tolerated well   Electrical Stimulation Goals Tone;Neuromuscular facilitation                  OT Short Term Goals - 11/18/15 0953    OT SHORT TERM GOAL #1   Title Following facilitation,Patient will demonstrate active finger flexion through 30% available range as skill leading to pre grasp ability.  10/15/2015  Time 4   Period Weeks   Status Achieved   OT SHORT TERM GOAL #2   Title Patient will demonstrate active release of finger flexion to begin to release 2 inch or larger objects from right hand   Time 4   Period Weeks   Status Achieved   OT SHORT TERM GOAL #3   Title Patient will demonstrate ability to flex shoulder to 120 degrees in preparation for reaching into cabinet for shelf at eye level with right arm   Time 4   Period Weeks   Status Achieved   OT SHORT TERM GOAL #4   Title Patient will feed self with modified independence   Time 4   Period Weeks   Status Achieved   OT SHORT TERM GOAL #5   Title Patient will complete grooming with modified independence   Time 4   Period Weeks   Status Achieved            OT Long Term Goals - 11/18/15 [id number]    OT LONG TERM GOAL #1   Title Patient will grasp and release small object 1-3 inches with right hand with minimal cueing / facilitation.  11/12/2015   Status Achieved   OT LONG TERM GOAL #2   Title Patient will reach into overhead shelf and retrieve a lightweight (1/2-1lb) object with minimal cueing / facilitation - 12/10/2015 (carried over for renewal)   Status On-going   OT LONG TERM GOAL #3   Title Patient will be able to safely don/doff splint for right hand, and show awareness of wearing schedule.   Status Achieved   OT LONG TERM GOAL #4   Title Patient will be independent with a home exercise / home activity program - 12/10/2015 (carried over for renewal)   Status On-going   OT LONG TERM GOAL #5   Title Patient will carry light weight item e.g. laundry basket, across length of room, i.e. 20 feet.     Status Achieved   OT LONG TERM GOAL #6   Title Patient will dress herself with modified independence   Status Achieved               Plan - 11/18/15 0954    Clinical Impression Statement Pt continues to make slow but steady progress toward goals. Pt clearly working on updated home activity program and using right hand functionally wihout cues today.   Pt will benefit from skilled therapeutic intervention in order to improve on the following deficits (Retired) Decreased balance;Decreased endurance;Decreased coordination;Decreased range of motion;Decreased safety awareness;Impaired perceived functional ability;Impaired vision/preception;Impaired UE functional use;Impaired tone;Impaired sensation   Rehab Potential Good   Clinical Impairments Affecting Rehab Potential impaired sensatoin, possible apraxia   OT Frequency 2x / week   OT Duration 8 weeks   OT Treatment/Interventions Electrical Stimulation;Moist Heat;Fluidtherapy;Therapeutic exercise;Neuromuscular education;DME and/or AE instruction;Manual Therapy;Functional Mobility  Training;Splinting;Therapeutic activities;Visual/perceptual remediation/compensation;Patient/family education;Balance training   Plan estim, NMR for grasp and release, functional use, mid to high level reach.    Recommended Other Services Pt today complained of R leg feeling weaker and tighter since the stroke and states she cannot get up from furniture at home. Recommend PT referral.    Consulted and Agree with Plan of Care Patient   Family Member Consulted [name] - significant other of 15 years        Problem List Patient Active Problem List   Diagnosis Date Noted  . Essential hypertension, benign 07/18/2014  . Atrial fibrillation, unspecified 07/18/2014  . Anxiety  state, unspecified 07/18/2014    Quay Burow, OTR/L 11/18/2015, 9:59 AM  [facility] [address] [place], [place], [address] Phone: [phone]   Fax:  [phone]  Name: Brenda Cunningham MRN: [id number] Date of Birth: 07-12-1951

## 2015-11-18 NOTE — Patient Instructions (Signed)
Discussed possibility of botox injection to R hand with pt and significant other, [name]. [name] to call and make appointment with Dr. Aretta Nip and email sent to his office to inform him.

## 2015-11-19 ENCOUNTER — Ambulatory Visit: Payer: Medicare Other | Admitting: Occupational Therapy

## 2015-11-19 ENCOUNTER — Encounter: Payer: Self-pay | Admitting: Occupational Therapy

## 2015-11-19 DIAGNOSIS — H539 Unspecified visual disturbance: Secondary | ICD-10-CM | POA: Diagnosis not present

## 2015-11-19 DIAGNOSIS — G8110 Spastic hemiplegia affecting unspecified side: Secondary | ICD-10-CM | POA: Diagnosis not present

## 2015-11-19 DIAGNOSIS — R4701 Aphasia: Secondary | ICD-10-CM | POA: Diagnosis not present

## 2015-11-19 DIAGNOSIS — R482 Apraxia: Secondary | ICD-10-CM | POA: Diagnosis not present

## 2015-11-19 DIAGNOSIS — I639 Cerebral infarction, unspecified: Secondary | ICD-10-CM | POA: Diagnosis not present

## 2015-11-19 DIAGNOSIS — R4189 Other symptoms and signs involving cognitive functions and awareness: Principal | ICD-10-CM

## 2015-11-19 DIAGNOSIS — R449 Unspecified symptoms and signs involving general sensations and perceptions: Secondary | ICD-10-CM

## 2015-11-19 NOTE — Therapy (Signed)
[facility] [address] [place], [place], [address] Phone: [phone]   Fax:  [phone]  Occupational Therapy Treatment  Patient Details  Name: Brenda Cunningham MRN: [id number] Date of Birth: [birth year] Referring Provider: Reece Levy  Encounter Date: 11/19/2015      OT End of Session - 11/19/15 1255    Visit Number 17   Number of Visits 25   Date for OT Re-Evaluation 01/06/16   Authorization Type Medicare - pt will need G code and progress note every 10th visit   Authorization Time Period G code visit 20 (and progress note)   Authorization - Visit Number 17   Authorization - Number of Visits 20   OT Start Time [phone]   OT Stop Time 1014   OT Time Calculation (min) 45 min   Activity Tolerance Patient tolerated treatment well      Past Medical History  Diagnosis Date  . Hypertension   . Arthritis   . A-fib ([facility])   . Hyperlipidemia   . Stroke ([facility]) 2010  . Breast cancer ([facility]) 2008    s/p lumpectomy, chemo and radiation therapy  . Wears glasses   . Anxiety     Past Surgical History  Procedure Laterality Date  . Breast lumpectomy  2008    left  . Appendectomy    . Tubal ligation    . Colonoscopy  2004    There were no vitals filed for this visit.  Visit Diagnosis:  Sensory deficit, right  Spastic hemiplegia affecting nondominant side (HCC)  Visual disturbance      Subjective Assessment - 11/19/15 0934    Subjective  I finally slept last night   Pertinent History see epic snapshot.  Pt with new stroke 8/16.  (pt also had stroke in 2010).     Patient Stated Goals pt points to hand and gestures she wants to use it   Currently in Pain? No/denies                      OT Treatments/Exercises (OP) - 11/19/15 0001    Neurological Re-education Exercises   Other Exercises 1 Neuro re ed via functional task following estim to address grasp and relase of small cylindrical objects with  emphasis on grading, using vision to compensate for impaired sensation and repetition to address motor planning. Pt is greatly improving in her ability to grade finger flexion to avoid  increasing tone and making relesae too difficult. Pt is also improving in her ability to use vision to compensate with less cueing.    Modalities   Modalities Retail buyer Location r wrist and fingers   Electrical Stimulation Action wist and ifinger extension   Electrical Stimulation Parameters 250 pps, 50 pulse, 17 intensity, 10 second on/off, 2.5 ramp. Pt tolerated well.   Electrical Stimulation Goals Tone;Neuromuscular facilitation;Strength                  OT Short Term Goals - 11/19/15 1254    OT SHORT TERM GOAL #1   Title Following facilitation,Patient will demonstrate active finger flexion through 30% available range as skill leading to pre grasp ability.  10/15/2015   Time 4   Period Weeks   Status Achieved   OT SHORT TERM GOAL #2   Title Patient will demonstrate active release of finger flexion to begin to release 2 inch or larger objects from right hand   Time  4   Period Weeks   Status Achieved   OT SHORT TERM GOAL #3   Title Patient will demonstrate ability to flex shoulder to 120 degrees in preparation for reaching into cabinet for shelf at eye level with right arm   Time 4   Period Weeks   Status Achieved   OT SHORT TERM GOAL #4   Title Patient will feed self with modified independence   Time 4   Period Weeks   Status Achieved   OT SHORT TERM GOAL #5   Title Patient will complete grooming with modified independence   Time 4   Period Weeks   Status Achieved           OT Long Term Goals - 11/19/15 1254    OT LONG TERM GOAL #1   Title Patient will grasp and release small object 1-3 inches with right hand with minimal cueing / facilitation.  11/12/2015   Status Achieved   OT LONG TERM GOAL #2   Title Patient  will reach into overhead shelf and retrieve a lightweight (1/2-1lb) object with minimal cueing / facilitation - 12/10/2015 (carried over for renewal)   Status On-going   OT LONG TERM GOAL #3   Title Patient will be able to safely don/doff splint for right hand, and show awareness of wearing schedule.   Status Achieved   OT LONG TERM GOAL #4   Title Patient will be independent with a home exercise / home activity program - 12/10/2015 (carried over for renewal)   Status On-going   OT LONG TERM GOAL #5   Title Patient will carry light weight item e.g. laundry basket, across length of room, i.e. 20 feet.     Status Achieved   OT LONG TERM GOAL #6   Title Patient will dress herself with modified independence   Status Achieved               Plan - 11/19/15 1254    Clinical Impression Statement Pt making slow but steady progress toward goal. Pt with improved ability to grade force and use visiont to improve function of R hand.    Pt will benefit from skilled therapeutic intervention in order to improve on the following deficits (Retired) Decreased balance;Decreased endurance;Decreased coordination;Decreased range of motion;Decreased safety awareness;Impaired perceived functional ability;Impaired vision/preception;Impaired UE functional use;Impaired tone;Impaired sensation   Rehab Potential Good   Clinical Impairments Affecting Rehab Potential impaired sensatoin, possible apraxia   OT Frequency 2x / week   OT Duration 8 weeks   OT Treatment/Interventions Electrical Stimulation;Moist Heat;Fluidtherapy;Therapeutic exercise;Neuromuscular education;DME and/or AE instruction;Manual Therapy;Functional Mobility Training;Splinting;Therapeutic activities;Visual/perceptual remediation/compensation;Patient/family education;Balance training   Plan estim, NMR for grasp and release, functional use, mid to high level reach with functional task   Consulted and Agree with Plan of Care Patient         Problem List Patient Active Problem List   Diagnosis Date Noted  . Essential hypertension, benign 07/18/2014  . Atrial fibrillation, unspecified 07/18/2014  . Anxiety state, unspecified 07/18/2014    Quay Burow, OTR/L 11/19/2015, 12:57 PM  [facility] [address] [place], [place], [address] Phone: [phone]   Fax:  [phone]  Name: Brenda Cunningham MRN: [id number] Date of Birth: 27-Mar-1951

## 2015-11-20 ENCOUNTER — Ambulatory Visit: Payer: Medicare Other

## 2015-11-20 DIAGNOSIS — R4189 Other symptoms and signs involving cognitive functions and awareness: Secondary | ICD-10-CM | POA: Diagnosis not present

## 2015-11-20 DIAGNOSIS — R482 Apraxia: Secondary | ICD-10-CM

## 2015-11-20 DIAGNOSIS — G8110 Spastic hemiplegia affecting unspecified side: Secondary | ICD-10-CM | POA: Diagnosis not present

## 2015-11-20 DIAGNOSIS — H539 Unspecified visual disturbance: Secondary | ICD-10-CM | POA: Diagnosis not present

## 2015-11-20 DIAGNOSIS — I639 Cerebral infarction, unspecified: Secondary | ICD-10-CM | POA: Diagnosis not present

## 2015-11-20 DIAGNOSIS — R4701 Aphasia: Secondary | ICD-10-CM | POA: Diagnosis not present

## 2015-11-20 DIAGNOSIS — IMO0002 Reserved for concepts with insufficient information to code with codable children: Secondary | ICD-10-CM

## 2015-11-20 NOTE — Therapy (Signed)
[facility] [address] [place], [place], [address] Phone: [phone]   Fax:  [phone]  Speech Language Pathology Treatment  Patient Details  Name: Brenda Cunningham MRN: [id number] Date of Birth: 1951-06-12 Referring Provider: Helane Rima  Encounter Date: 11/20/2015      End of Session - 11/20/15 0959    Visit Number 9   Number of Visits 25   Date for SLP Re-Evaluation 01/20/16  renewal for 8 more weeks was done 11-20-15   SLP Start Time 0936   SLP Stop Time  1016   SLP Time Calculation (min) 40 min   Activity Tolerance Patient tolerated treatment well      Past Medical History  Diagnosis Date  . Hypertension   . Arthritis   . A-fib ([facility])   . Hyperlipidemia   . Stroke ([facility]) 2010  . Breast cancer ([facility]) 2008    s/p lumpectomy, chemo and radiation therapy  . Wears glasses   . Anxiety     Past Surgical History  Procedure Laterality Date  . Breast lumpectomy  2008    left  . Appendectomy    . Tubal ligation    . Colonoscopy  2004    There were no vitals filed for this visit.  Visit Diagnosis: Combined receptive and expressive aphasia due to cerebrovascular accident (CVA) [facility])  Verbal apraxia      Subjective Assessment - 11/20/15 0941    Subjective "I wet and I wet." ("I read and I read (the homework)."   Currently in Pain? No/denies               ADULT SLP TREATMENT - 11/20/15 0941    General Information   Behavior/Cognition Alert;Cooperative;Pleasant mood;Confused   Treatment Provided   Treatment provided Cognitive-Linquistic   Pain Assessment   Pain Assessment No/denies pain   Cognitive-Linquistic Treatment   Treatment focused on Aphasia;Apraxia   Skilled Treatment To facilitate pt's verbal expression, SLP drilled pt with opposites/cloze phrases and pt was 70% successful functionally (intelligible) with answers. She benefitted from spelling cues and phonemic cues  occasionally to raise success to 90%. Aphasic reading expressed with prepositions 75% of the time. In simple "why" questions, pt answered functionally 75% of the time, with rare min questioning cues. She demo'd understanding of these with 90% success. In simple conversation pt demo'd understanding approx 70% of the time with occasional min A. At times, pt's conversational speech is    Assessment / Recommendations / Plan   Plan Continue with current plan of care  renewal/recert sent today   Progression Toward Goals   Progression toward goals Progressing toward goals          SLP Education - 11/20/15 0958    Education provided Yes   Education Details Reduce rate to improve verbal expression   Person(s) Educated Patient   Methods Explanation;Demonstration;Tactile cues   Comprehension Verbalized understanding;Returned demonstration;Need further instruction;Verbal cues required          SLP Short Term Goals - 11/20/15 1009    SLP SHORT TERM GOAL #1   Title pt will generate appropriate language to participate in simple converastion functionally with occasional  min A   Time 4   Period Weeks  or until visit number 16 (for all STGs)   Status Not Met  all goals ongoing for renewal dated 11-20-15   SLP SHORT TERM GOAL #2   Title pt will generate sentence level responses in structured tasks with occasional min A  Time 4   Period Weeks   Status Not Met   SLP SHORT TERM GOAL #3   Title pt will demo understanding of complex commands with extra time and rare min A   Time 4   Period Weeks   Status Not Met   SLP SHORT TERM GOAL #4   Title demo understanding of longer verbal stimuli/mod complex conversation 80% with occasional min A   Time 4   Period Weeks   Status Not Met   SLP SHORT TERM GOAL #5   Title pt will attempt to use multimodal communicaiton when verbal communication unsuccessful/difficult    Time 4   Period Weeks   Status Not Met          SLP Long Term Goals -  11/20/15 1015    SLP LONG TERM GOAL #1   Title pt will engage in simple to mod complex conversation functionally with modified independence (multimodal communication, compensations, extra time) with rare min A   Time 8   Period Weeks  goals remain "ongoing" for 8 more weeks beginning 11-20-15 (01-20-16)   Status Not Met   SLP LONG TERM GOAL #2   Title pt will demo understanding of longer or mod complex verbal stimuli with modified independence   Time 8   Period Weeks   Status Not Met   SLP LONG TERM GOAL #3   Title pt will perform HEP for motor speech/planning with 80% success and compensations used   Time 8   Period Weeks   Status Not Met   SLP LONG TERM GOAL #4   Title pt will demo understanding of 3-4 sentence paragraph level written information with modified independence   Time 8   Period Weeks   Status Deferred  due to work with verbal expression          Plan - 11/20/15 1002    Clinical Impression Statement See patient's goal update for details of pt's current status with goals. She has only been seen 8/16 planned visits in the past 8 weeks. All goals, STGs and LTGs, to continue for 4 and 8 weeks, respectively. Pt continues to be unaware of approx 50% epressive errors when reading simple sentences, which hinders reading comprehension. Skilled ST remains needed to maximize expressive and receptive language skills to improve pt's QOL.   Speech Therapy Frequency 2x / week   Duration --  8 weeks total (11-20-15)   Treatment/Interventions SLP instruction and feedback;Compensatory strategies;Internal/external aids;Patient/family education;Functional tasks;Cueing hierarchy;Multimodal communcation approach;Language facilitation   Potential to Achieve Goals Good   Potential Considerations Severity of impairments        Problem List Patient Active Problem List   Diagnosis Date Noted  . Essential hypertension, benign 07/18/2014  . Atrial fibrillation, unspecified 07/18/2014  .  Anxiety state, unspecified 07/18/2014    [facility] , [place], CCC-SLP  11/20/2015, 1:51 PM  [facility] [address] [place], [place], [address] Phone: [phone]   Fax:  [phone]   Name: Kaylaann Mountz MRN: [id number] Date of Birth: 1951-01-10

## 2015-11-21 ENCOUNTER — Ambulatory Visit: Payer: Medicare Other | Admitting: Speech Pathology

## 2015-11-21 DIAGNOSIS — R482 Apraxia: Secondary | ICD-10-CM | POA: Diagnosis not present

## 2015-11-21 DIAGNOSIS — IMO0002 Reserved for concepts with insufficient information to code with codable children: Secondary | ICD-10-CM

## 2015-11-21 DIAGNOSIS — R4701 Aphasia: Secondary | ICD-10-CM | POA: Diagnosis not present

## 2015-11-21 DIAGNOSIS — R4189 Other symptoms and signs involving cognitive functions and awareness: Secondary | ICD-10-CM | POA: Diagnosis not present

## 2015-11-21 DIAGNOSIS — G8110 Spastic hemiplegia affecting unspecified side: Secondary | ICD-10-CM | POA: Diagnosis not present

## 2015-11-21 DIAGNOSIS — I639 Cerebral infarction, unspecified: Secondary | ICD-10-CM | POA: Diagnosis not present

## 2015-11-21 DIAGNOSIS — H539 Unspecified visual disturbance: Secondary | ICD-10-CM | POA: Diagnosis not present

## 2015-11-21 NOTE — Therapy (Signed)
[facility] [address] [place], [place], [address] Phone: [phone]   Fax:  [phone]  Speech Language Pathology Treatment  Patient Details  Name: Brenda Cunningham MRN: [id number] Date of Birth: [birth year] Referring Provider: Helane Rima  Encounter Date: 11/21/2015      End of Session - 11/21/15 1217    Visit Number 10   Number of Visits 25   Date for SLP Re-Evaluation 01/20/16   SLP Start Time 0932   SLP Stop Time  1015   SLP Time Calculation (min) 43 min      Past Medical History  Diagnosis Date  . Hypertension   . Arthritis   . A-fib ([facility])   . Hyperlipidemia   . Stroke ([facility]) 2010  . Breast cancer ([facility]) 2008    s/p lumpectomy, chemo and radiation therapy  . Wears glasses   . Anxiety     Past Surgical History  Procedure Laterality Date  . Breast lumpectomy  2008    left  . Appendectomy    . Tubal ligation    . Colonoscopy  2004    There were no vitals filed for this visit.  Visit Diagnosis: Combined receptive and expressive aphasia due to cerebrovascular accident (CVA) [facility])  Verbal apraxia      Subjective Assessment - 11/21/15 0851    Subjective "We've been talking and doing the cards"               ADULT SLP TREATMENT - 11/21/15 0852    General Information   Behavior/Cognition Alert;Cooperative;Pleasant mood;Confused   Treatment Provided   Treatment provided Cognitive-Linquistic   Pain Assessment   Pain Assessment No/denies pain   Cognitive-Linquistic Treatment   Treatment focused on Aphasia;Apraxia   Skilled Treatment Basic naming 6 items for simple category with occasional min semantic cues and extended time.  Mildly complex categories with usual mod semantic and written cues. Phoneminc paraphasias and verbal apraxia with occasional mod A for error awareness. Auditory comprenesion for convergent naming tasks 90% with slower rate, occasional min repetition.  Facilitated  compensations for  aphasia having pt describe simple animals for ST to guess with 70% accuracy and occasional questioning   Assessment / Recommendations / Plan   Plan Continue with current plan of care   Progression Toward Goals   Progression toward goals Progressing toward goals          SLP Education - 11/20/15 0958    Education provided Yes   Education Details Reduce rate to improve verbal expression   Person(s) Educated Patient   Methods Explanation;Demonstration;Tactile cues   Comprehension Verbalized understanding;Returned demonstration;Need further instruction;Verbal cues required          SLP Short Term Goals - 11/21/15 1216    SLP SHORT TERM GOAL #1   Title pt will generate appropriate language to participate in simple converastion functionally with occasional  min A   Time 4   Period Weeks  or until visit number 16 (for all STGs)   Status Not Met  all goals ongoing for renewal dated 11-20-15   SLP SHORT TERM GOAL #2   Title pt will generate sentence level responses in structured tasks with occasional min A   Time 4   Period Weeks   Status Not Met   SLP SHORT TERM GOAL #3   Title pt will demo understanding of complex commands with extra time and rare min A   Time 4   Period Weeks   Status Not  Met   SLP SHORT TERM GOAL #4   Title demo understanding of longer verbal stimuli/mod complex conversation 80% with occasional min A   Time 4   Period Weeks   Status Not Met   SLP SHORT TERM GOAL #5   Title pt will attempt to use multimodal communicaiton when verbal communication unsuccessful/difficult    Time 4   Period Weeks   Status Not Met          SLP Long Term Goals - 11/21/15 1217    SLP LONG TERM GOAL #1   Title pt will engage in simple to mod complex conversation functionally with modified independence (multimodal communication, compensations, extra time) with rare min A   Time 8   Period Weeks  goals remain "ongoing" for 8 more weeks beginning 11-20-15  (01-20-16)   Status Not Met   SLP LONG TERM GOAL #2   Title pt will demo understanding of longer or mod complex verbal stimuli with modified independence   Time 8   Period Weeks   Status Not Met   SLP LONG TERM GOAL #3   Title pt will perform HEP for motor speech/planning with 80% success and compensations used   Time 8   Period Weeks   Status Not Met   SLP LONG TERM GOAL #4   Title pt will demo understanding of 3-4 sentence paragraph level written information with modified independence   Time 8   Period Weeks   Status Deferred  due to work with verbal expression          Plan - 11/21/15 1216    Clinical Impression Statement Continue skilled ST to maximixe verbal expression, reading and auditory comprehension for improved independence.         Problem List Patient Active Problem List   Diagnosis Date Noted  . Essential hypertension, benign 07/18/2014  . Atrial fibrillation, unspecified 07/18/2014  . Anxiety state, unspecified 07/18/2014    [place], Annye Rusk MS, CCC-SLP 11/21/2015, 12:18 PM  [facility] [address] [place], [place], [address] Phone: [phone]   Fax:  [phone]   Name: Brenda Cunningham MRN: [id number] Date of Birth: 1951/09/03

## 2015-11-25 ENCOUNTER — Encounter: Payer: Self-pay | Admitting: Occupational Therapy

## 2015-11-25 ENCOUNTER — Ambulatory Visit: Payer: Medicare Other | Admitting: Occupational Therapy

## 2015-11-25 DIAGNOSIS — R4701 Aphasia: Secondary | ICD-10-CM | POA: Diagnosis not present

## 2015-11-25 DIAGNOSIS — R482 Apraxia: Secondary | ICD-10-CM | POA: Diagnosis not present

## 2015-11-25 DIAGNOSIS — R4189 Other symptoms and signs involving cognitive functions and awareness: Secondary | ICD-10-CM | POA: Diagnosis not present

## 2015-11-25 DIAGNOSIS — R449 Unspecified symptoms and signs involving general sensations and perceptions: Secondary | ICD-10-CM

## 2015-11-25 DIAGNOSIS — H539 Unspecified visual disturbance: Secondary | ICD-10-CM

## 2015-11-25 DIAGNOSIS — I639 Cerebral infarction, unspecified: Secondary | ICD-10-CM | POA: Diagnosis not present

## 2015-11-25 DIAGNOSIS — G8110 Spastic hemiplegia affecting unspecified side: Secondary | ICD-10-CM

## 2015-11-25 NOTE — Therapy (Signed)
[facility] [address] [place], [place], [address] Phone: [phone]   Fax:  [phone]  Occupational Therapy Treatment  Patient Details  Name: Brenda Cunningham MRN: [id number] Date of Birth: 02-01-1951 Referring Provider: Reece Levy  Encounter Date: 11/25/2015      OT End of Session - 11/25/15 1129    Visit Number 18   Number of Visits 25   Date for OT Re-Evaluation 01/06/16   Authorization Type Medicare - pt will need G code and progress note every 10th visit   Authorization Time Period G code visit 20 (and progress note)   Authorization - Visit Number 18   Authorization - Number of Visits 20   OT Start Time [phone]  pt arrived late   OT Stop Time 0930   OT Time Calculation (min) 37 min   Activity Tolerance Patient tolerated treatment well      Past Medical History  Diagnosis Date  . Hypertension   . Arthritis   . A-fib ([place])   . Hyperlipidemia   . Stroke ([place]) 2010  . Breast cancer ([facility]) 2008    s/p lumpectomy, chemo and radiation therapy  . Wears glasses   . Anxiety     Past Surgical History  Procedure Laterality Date  . Breast lumpectomy  2008    left  . Appendectomy    . Tubal ligation    . Colonoscopy  2004    There were no vitals filed for this visit.  Visit Diagnosis:  Sensory deficit, right  Spastic hemiplegia affecting nondominant side (HCC)  Visual disturbance      Subjective Assessment - 11/25/15 0856    Subjective  I didn't sleep all weekend   Pertinent History see epic snapshot.  Pt with new stroke 8/16.  (pt also had stroke in 2010).     Patient Stated Goals pt points to hand and gestures she wants to use it   Currently in Pain? No/denies                      OT Treatments/Exercises (OP) - 11/25/15 0001    Neurological Re-education Exercises   Other Exercises 1 Neuro re ed after estim to facilitate active graded grasp and release first with cylindrical  shapes of varying sized and then with one inch cubes. Pt requires min facilitation to open hand to grasp cylinder but then able to grade gasp appropriately after practice and release with just object being stabilized.  Also practiced 2 point pinch to pick up cube and stack - pt needing only min facilitation. Pt is currently attempting to arrange MD appointment for possible botox injection. Pt benefits from practice and repetition.    Acupuncturist Location wrist and fingers   Electrical Stimulation Action wrist and finger extension   Electrical Stimulation Parameters 250pps, 50 pulse, 23 intensity, 10 second on/off, 2.5 ramp, Pt tolrated well. Pt with improved isolated wrist extesnion                  OT Short Term Goals - 11/25/15 1127    OT SHORT TERM GOAL #1   Title Following facilitation,Patient will demonstrate active finger flexion through 30% available range as skill leading to pre grasp ability.  10/15/2015   Time 4   Period Weeks   Status Achieved   OT SHORT TERM GOAL #2   Title Patient will demonstrate active release of finger flexion to begin to release 2  inch or larger objects from right hand   Time 4   Period Weeks   Status Achieved   OT SHORT TERM GOAL #3   Title Patient will demonstrate ability to flex shoulder to 120 degrees in preparation for reaching into cabinet for shelf at eye level with right arm   Time 4   Period Weeks   Status Achieved   OT SHORT TERM GOAL #4   Title Patient will feed self with modified independence   Time 4   Period Weeks   Status Achieved   OT SHORT TERM GOAL #5   Title Patient will complete grooming with modified independence   Time 4   Period Weeks   Status Achieved           OT Long Term Goals - 11/25/15 1127    OT LONG TERM GOAL #1   Title Patient will grasp and release small object 1-3 inches with right hand with minimal cueing / facilitation.  11/12/2015   Status Achieved   OT LONG  TERM GOAL #2   Title Patient will reach into overhead shelf and retrieve a lightweight (1/2-1lb) object with minimal cueing / facilitation - 12/10/2015 (carried over for renewal)   Status On-going   OT LONG TERM GOAL #3   Title Patient will be able to safely don/doff splint for right hand, and show awareness of wearing schedule.   Status Achieved   OT LONG TERM GOAL #4   Title Patient will be independent with a home exercise / home activity program - 12/10/2015 (carried over for renewal)   Status On-going   OT LONG TERM GOAL #5   Title Patient will carry light weight item e.g. laundry basket, across length of room, i.e. 20 feet.     Status Achieved   OT LONG TERM GOAL #6   Title Patient will dress herself with modified independence   Status Achieved               Plan - 11/25/15 1128    Clinical Impression Statement Pt making slow but steady progress toward goals. Pt reports she is not sleeping well for past few weeks and now has no appetite. Pt to see primary MD in 2 weeks and will share.    Pt will benefit from skilled therapeutic intervention in order to improve on the following deficits (Retired) Decreased balance;Decreased endurance;Decreased coordination;Decreased range of motion;Decreased safety awareness;Impaired perceived functional ability;Impaired vision/preception;Impaired UE functional use;Impaired tone;Impaired sensation   Rehab Potential Good   Clinical Impairments Affecting Rehab Potential impaired sensatoin, possible apraxia   OT Frequency 2x / week   OT Duration 8 weeks   OT Treatment/Interventions Electrical Stimulation;Moist Heat;Fluidtherapy;Therapeutic exercise;Neuromuscular education;DME and/or AE instruction;Manual Therapy;Functional Mobility Training;Splinting;Therapeutic activities;Visual/perceptual remediation/compensation;Patient/family education;Balance training   Plan estim, NMR for grasp and release with reach, functional use,    Consulted and Agree with  Plan of Care Patient        Problem List Patient Active Problem List   Diagnosis Date Noted  . Essential hypertension, benign 07/18/2014  . Atrial fibrillation, unspecified 07/18/2014  . Anxiety state, unspecified 07/18/2014    Quay Burow, OTR/L 11/25/2015, 11:35 AM  [facility] [address] [place], [place], [address] Phone: [phone]   Fax:  [phone]  Name: Brenda Cunningham MRN: [id number] Date of Birth: [birth year]

## 2015-11-26 ENCOUNTER — Encounter: Payer: Self-pay | Admitting: Occupational Therapy

## 2015-11-26 ENCOUNTER — Ambulatory Visit: Payer: Medicare Other | Admitting: Occupational Therapy

## 2015-11-26 DIAGNOSIS — H539 Unspecified visual disturbance: Secondary | ICD-10-CM

## 2015-11-26 DIAGNOSIS — R4189 Other symptoms and signs involving cognitive functions and awareness: Principal | ICD-10-CM

## 2015-11-26 DIAGNOSIS — I639 Cerebral infarction, unspecified: Secondary | ICD-10-CM | POA: Diagnosis not present

## 2015-11-26 DIAGNOSIS — G8110 Spastic hemiplegia affecting unspecified side: Secondary | ICD-10-CM | POA: Diagnosis not present

## 2015-11-26 DIAGNOSIS — R449 Unspecified symptoms and signs involving general sensations and perceptions: Secondary | ICD-10-CM

## 2015-11-26 DIAGNOSIS — R4701 Aphasia: Secondary | ICD-10-CM | POA: Diagnosis not present

## 2015-11-26 DIAGNOSIS — R482 Apraxia: Secondary | ICD-10-CM | POA: Diagnosis not present

## 2015-11-26 NOTE — Therapy (Signed)
[facility] [address] [place], [place], [address] Phone: [phone]   Fax:  [phone]  Occupational Therapy Treatment  Patient Details  Name: Brenda Cunningham MRN: [id number] Date of Birth: 04/26/1951 Referring Provider: Reece Levy  Encounter Date: 11/26/2015      OT End of Session - 11/26/15 1640    Visit Number 19   Number of Visits 25   Date for OT Re-Evaluation 01/06/16   Authorization Type Medicare - pt will need G code and progress note every 10th visit   Authorization Time Period G code visit 20 (and progress note)   Authorization - Visit Number 19   Authorization - Number of Visits 20   OT Start Time 1315   OT Stop Time 1358   OT Time Calculation (min) 43 min   Activity Tolerance Patient tolerated treatment well      Past Medical History  Diagnosis Date  . Hypertension   . Arthritis   . A-fib ([facility])   . Hyperlipidemia   . Stroke ([facility]) 2010  . Breast cancer ([facility]) 2008    s/p lumpectomy, chemo and radiation therapy  . Wears glasses   . Anxiety     Past Surgical History  Procedure Laterality Date  . Breast lumpectomy  2008    left  . Appendectomy    . Tubal ligation    . Colonoscopy  2004    There were no vitals filed for this visit.  Visit Diagnosis:  Sensory deficit, right  Spastic hemiplegia affecting nondominant side (HCC)  Visual disturbance      Subjective Assessment - 11/26/15 1320    Subjective  I got  a little rest last night.   Pertinent History see epic snapshot.  Pt with new stroke 8/16.  (pt also had stroke in 2010).     Patient Stated Goals pt points to hand and gestures she wants to use it   Currently in Pain? No/denies                      OT Treatments/Exercises (OP) - 11/26/15 0001    Neurological Re-education Exercises   Other Exercises 1 Neuro re ed following estim to facilitate functional grasp and release as well as placing objects in  specific slots.  Pt able to complete today without any facilitation and with intermittent vc's only.  Pt with improved ability to grade grasp, "turn off" hand in preparation for release, and beginning isolated finger extension. Pt also today demonstrating some lateral pinch with functional tasks.                  OT Short Term Goals - 11/26/15 1639    OT SHORT TERM GOAL #1   Title Following facilitation,Patient will demonstrate active finger flexion through 30% available range as skill leading to pre grasp ability.  10/15/2015   Time 4   Period Weeks   Status Achieved   OT SHORT TERM GOAL #2   Title Patient will demonstrate active release of finger flexion to begin to release 2 inch or larger objects from right hand   Time 4   Period Weeks   Status Achieved   OT SHORT TERM GOAL #3   Title Patient will demonstrate ability to flex shoulder to 120 degrees in preparation for reaching into cabinet for shelf at eye level with right arm   Time 4   Period Weeks   Status Achieved   OT SHORT TERM GOAL #  4   Title Patient will feed self with modified independence   Time 4   Period Weeks   Status Achieved   OT SHORT TERM GOAL #5   Title Patient will complete grooming with modified independence   Time 4   Period Weeks   Status Achieved           OT Long Term Goals - 11/26/15 1639    OT LONG TERM GOAL #1   Title Patient will grasp and release small object 1-3 inches with right hand with minimal cueing / facilitation.  11/12/2015   Status Achieved   OT LONG TERM GOAL #2   Title Patient will reach into overhead shelf and retrieve a lightweight (1/2-1lb) object with minimal cueing / facilitation - 12/10/2015 (carried over for renewal)   Status On-going   OT LONG TERM GOAL #3   Title Patient will be able to safely don/doff splint for right hand, and show awareness of wearing schedule.   Status Achieved   OT LONG TERM GOAL #4   Title Patient will be independent with a home exercise /  home activity program - 12/10/2015 (carried over for renewal)   Status On-going   OT LONG TERM GOAL #5   Title Patient will carry light weight item e.g. laundry basket, across length of room, i.e. 20 feet.     Status Achieved   OT LONG TERM GOAL #6   Title Patient will dress herself with modified independence   Status Achieved               Plan - 11/26/15 1639    Clinical Impression Statement Pt continues to make improvements in graded grasp and beginning active release of objects. Pt very motivated.    Pt will benefit from skilled therapeutic intervention in order to improve on the following deficits (Retired) Decreased balance;Decreased endurance;Decreased coordination;Decreased range of motion;Decreased safety awareness;Impaired perceived functional ability;Impaired vision/preception;Impaired UE functional use;Impaired tone;Impaired sensation   Clinical Impairments Affecting Rehab Potential impaired sensatoin, possible apraxia   OT Frequency 2x / week   OT Duration 8 weeks   OT Treatment/Interventions Electrical Stimulation;Moist Heat;Fluidtherapy;Therapeutic exercise;Neuromuscular education;DME and/or AE instruction;Manual Therapy;Functional Mobility Training;Splinting;Therapeutic activities;Visual/perceptual remediation/compensation;Patient/family education;Balance training   Plan mid to high reach with grasp and release incorporated, estim, functional use of R hand   Consulted and Agree with Plan of Care Patient        Problem List Patient Active Problem List   Diagnosis Date Noted  . Essential hypertension, benign 07/18/2014  . Atrial fibrillation, unspecified 07/18/2014  . Anxiety state, unspecified 07/18/2014    Quay Burow, OTR/L 11/26/2015, 4:42 PM  [facility] [address] [place], [place], [address] Phone: [phone]   Fax:  [phone]  Name: Brenda Cunningham MRN: [id number] Date of  Birth: [birth year]

## 2015-11-27 ENCOUNTER — Ambulatory Visit: Payer: Medicare Other

## 2015-11-27 DIAGNOSIS — R482 Apraxia: Secondary | ICD-10-CM

## 2015-11-27 DIAGNOSIS — I639 Cerebral infarction, unspecified: Secondary | ICD-10-CM | POA: Diagnosis not present

## 2015-11-27 DIAGNOSIS — H539 Unspecified visual disturbance: Secondary | ICD-10-CM | POA: Diagnosis not present

## 2015-11-27 DIAGNOSIS — R4189 Other symptoms and signs involving cognitive functions and awareness: Secondary | ICD-10-CM | POA: Diagnosis not present

## 2015-11-27 DIAGNOSIS — IMO0002 Reserved for concepts with insufficient information to code with codable children: Secondary | ICD-10-CM

## 2015-11-27 DIAGNOSIS — R4701 Aphasia: Secondary | ICD-10-CM | POA: Diagnosis not present

## 2015-11-27 DIAGNOSIS — G8110 Spastic hemiplegia affecting unspecified side: Secondary | ICD-10-CM | POA: Diagnosis not present

## 2015-11-27 NOTE — Therapy (Signed)
[facility] [address] [place], [place], [address] Phone: [phone]   Fax:  [phone]  Speech Language Pathology Treatment  Patient Details  Name: Brenda Cunningham MRN: [id number] Date of Birth: 08/12/1951 Referring Provider: Helane Rima  Encounter Date: 11/27/2015      End of Session - 11/27/15 1412    Visit Number 11   Number of Visits 25   Date for SLP Re-Evaluation 01/20/16   SLP Start Time 7416   SLP Stop Time  1400   SLP Time Calculation (min) 43 min   Activity Tolerance Patient tolerated treatment well      Past Medical History  Diagnosis Date  . Hypertension   . Arthritis   . A-fib ([facility])   . Hyperlipidemia   . Stroke ([facility]) 2010  . Breast cancer ([facility]) 2008    s/p lumpectomy, chemo and radiation therapy  . Wears glasses   . Anxiety     Past Surgical History  Procedure Laterality Date  . Breast lumpectomy  2008    left  . Appendectomy    . Tubal ligation    . Colonoscopy  2004    There were no vitals filed for this visit.  Visit Diagnosis: Combined receptive and expressive aphasia due to cerebrovascular accident (CVA) [facility])  Verbal apraxia      Subjective Assessment - 11/27/15 1320    Subjective "I feel bad today. Didn't get no sleep!"               ADULT SLP TREATMENT - 11/27/15 1320    General Information   Behavior/Cognition Alert;Cooperative;Pleasant mood   Treatment Provided   Treatment provided Cognitive-Linquistic   Pain Assessment   Pain Assessment No/denies pain   Cognitive-Linquistic Treatment   Treatment focused on Aphasia;Apraxia   Skilled Treatment To target pt's spontaneous speech SLP had pt name occupations on cards - 82% success functionally on first attempt. Self correction attempted 95% of the time and pt noted to routinely compensate by circumlocution or synonym. To incr complexity of task, pt expanded necessary linguistic response to include  sentences: telling function of an item with 60% success, functionally, with subjectively more frequent errors than with naming occupations. Self correction attempted 80% of the time. In auditory comprehension practice ([place] pic SLP described with long sentence from f:4), pt was 95% successful   Assessment / Recommendations / [place] with current plan of care   Progression Toward Goals   Progression toward goals Progressing toward goals            SLP Short Term Goals - 11/27/15 1413    SLP SHORT TERM GOAL #1   Title pt will generate appropriate language to participate in simple converastion functionally with occasional  min A   Time 4   Period Weeks  or until visit number 16 (for all STGs)   Status Not Met  all goals ongoing for renewal dated 11-20-15   SLP SHORT TERM GOAL #2   Title pt will generate sentence level responses in structured tasks with occasional min A   Time 4   Period Weeks   Status Not Met   SLP SHORT TERM GOAL #3   Title pt will demo understanding of complex commands with extra time and rare min A   Time 4   Period Weeks   Status Not Met   SLP SHORT TERM GOAL #4   Title demo understanding of longer verbal stimuli/mod complex conversation 80%  with occasional min A   Time 4   Period Weeks   Status Not Met   SLP SHORT TERM GOAL #5   Title pt will attempt to use multimodal communicaiton when verbal communication unsuccessful/difficult    Time 4   Period Weeks   Status Not Met          SLP Long Term Goals - 11/27/15 1413    SLP LONG TERM GOAL #1   Title pt will engage in simple to mod complex conversation functionally with modified independence (multimodal communication, compensations, extra time) with rare min A   Time 8   Period Weeks  goals remain "ongoing" for 8 more weeks beginning 11-20-15 (01-20-16)   Status Not Met   SLP LONG TERM GOAL #2   Title pt will demo understanding of longer or mod complex verbal stimuli with modified  independence   Time 8   Period Weeks   Status Not Met   SLP LONG TERM GOAL #3   Title pt will perform HEP for motor speech/planning with 80% success and compensations used   Time 8   Period Weeks   Status Not Met   SLP LONG TERM GOAL #4   Title pt will demo understanding of 3-4 sentence paragraph level written information with modified independence   Time 8   Period Weeks   Status Deferred  due to work with verbal expression          Plan - 11/27/15 1413    Clinical Impression Statement Continue skilled ST to maximixe verbal expression, reading and auditory comprehension for improved independence.    Speech Therapy Frequency 2x / week   Duration --  8 weeks total (01-22-16)   Treatment/Interventions SLP instruction and feedback;Compensatory strategies;Internal/external aids;Patient/family education;Functional tasks;Cueing hierarchy;Multimodal communcation approach;Language facilitation   Potential to Achieve Goals Good   Potential Considerations Severity of impairments        Problem List Patient Active Problem List   Diagnosis Date Noted  . Essential hypertension, benign 07/18/2014  . Atrial fibrillation, unspecified 07/18/2014  . Anxiety state, unspecified 07/18/2014    [facility] , [place], CCC-SLP 11/27/2015, 2:15 PM  [facility] [address] [place], [place], [address] Phone: [phone]   Fax:  [phone]   Name: Glendale Wherry MRN: [id number] Date of Birth: Jul 08, 1951

## 2015-11-28 ENCOUNTER — Encounter (HOSPITAL_COMMUNITY): Payer: Self-pay | Admitting: Vascular Surgery

## 2015-11-28 ENCOUNTER — Emergency Department (HOSPITAL_COMMUNITY)
Admission: EM | Admit: 2015-11-28 | Discharge: 2015-11-28 | Disposition: A | Discharge status: Home / Self Care | Payer: Medicare Other | Attending: Emergency Medicine | Admitting: Emergency Medicine

## 2015-11-28 ENCOUNTER — Emergency Department (HOSPITAL_COMMUNITY): Payer: Medicare Other

## 2015-11-28 DIAGNOSIS — C786 Secondary malignant neoplasm of retroperitoneum and peritoneum: Secondary | ICD-10-CM

## 2015-11-28 DIAGNOSIS — Z8673 Personal history of transient ischemic attack (TIA), and cerebral infarction without residual deficits: Secondary | ICD-10-CM | POA: Diagnosis not present

## 2015-11-28 DIAGNOSIS — I10 Essential (primary) hypertension: Secondary | ICD-10-CM | POA: Insufficient documentation

## 2015-11-28 DIAGNOSIS — M1990 Unspecified osteoarthritis, unspecified site: Secondary | ICD-10-CM | POA: Diagnosis not present

## 2015-11-28 DIAGNOSIS — C800 Disseminated malignant neoplasm, unspecified: Secondary | ICD-10-CM | POA: Insufficient documentation

## 2015-11-28 DIAGNOSIS — Z79899 Other long term (current) drug therapy: Secondary | ICD-10-CM | POA: Diagnosis not present

## 2015-11-28 DIAGNOSIS — Z7982 Long term (current) use of aspirin: Secondary | ICD-10-CM | POA: Diagnosis not present

## 2015-11-28 DIAGNOSIS — E785 Hyperlipidemia, unspecified: Secondary | ICD-10-CM | POA: Diagnosis not present

## 2015-11-28 DIAGNOSIS — C801 Malignant (primary) neoplasm, unspecified: Secondary | ICD-10-CM

## 2015-11-28 DIAGNOSIS — Z853 Personal history of malignant neoplasm of breast: Secondary | ICD-10-CM | POA: Diagnosis not present

## 2015-11-28 DIAGNOSIS — F419 Anxiety disorder, unspecified: Secondary | ICD-10-CM | POA: Insufficient documentation

## 2015-11-28 DIAGNOSIS — R1032 Left lower quadrant pain: Secondary | ICD-10-CM

## 2015-11-28 LAB — COMPREHENSIVE METABOLIC PANEL
ALT: 22 U/L (ref 14–54)
AST: 23 U/L (ref 15–41)
Albumin: 3.1 g/dL — ABNORMAL LOW (ref 3.5–5.0)
Alkaline Phosphatase: 144 U/L — ABNORMAL HIGH (ref 38–126)
Anion gap: 9 (ref 5–15)
BUN: 10 mg/dL (ref 6–20)
CO2: 25 mmol/L (ref 22–32)
Calcium: 9.7 mg/dL (ref 8.9–10.3)
Chloride: 103 mmol/L (ref 101–111)
Creatinine, Ser: 0.79 mg/dL (ref 0.44–1.00)
GFR calc Af Amer: >60 mL/min (ref >60)
GFR calc non Af Amer: >60 mL/min (ref >60)
Glucose, Bld: 121 mg/dL — ABNORMAL HIGH (ref 65–99)
Potassium: 4.1 mmol/L (ref 3.5–5.1)
Sodium: 137 mmol/L (ref 135–145)
Total Bilirubin: 1.3 mg/dL — ABNORMAL HIGH (ref 0.3–1.2)
Total Protein: 7.3 g/dL (ref 6.5–8.1)

## 2015-11-28 LAB — CBC
HCT: 34.1 % — ABNORMAL LOW (ref 36.0–46.0)
Hemoglobin: 11.3 g/dL — ABNORMAL LOW (ref 12.0–15.0)
MCH: 32 pg (ref 26.0–34.0)
MCHC: 33.1 g/dL (ref 30.0–36.0)
MCV: 96.6 fL (ref 78.0–100.0)
Platelets: 331 10*3/uL (ref 150–400)
RBC: 3.53 MIL/uL — ABNORMAL LOW (ref 3.87–5.11)
RDW: 14.2 % (ref 11.5–15.5)
WBC: 8.4 10*3/uL (ref 4.0–10.5)

## 2015-11-28 LAB — URINALYSIS, ROUTINE W REFLEX MICROSCOPIC
Bilirubin Urine: NEGATIVE
Glucose, UA: NEGATIVE mg/dL
Ketones, ur: NEGATIVE mg/dL
Leukocytes, UA: NEGATIVE
Nitrite: NEGATIVE
Protein, ur: NEGATIVE mg/dL
Specific Gravity, Urine: 1.013 (ref 1.005–1.030)
pH: 5.5 (ref 5.0–8.0)

## 2015-11-28 LAB — URINE MICROSCOPIC-ADD ON: WBC, UA: NONE SEEN WBC/hpf (ref 0–5)

## 2015-11-28 MED ORDER — IOHEXOL 300 MG/ML  SOLN
100.0000 mL | Freq: Once | INTRAMUSCULAR | Status: AC | PRN
  Administered 2015-11-28: 100 mL via INTRAVENOUS

## 2015-11-28 NOTE — ED Notes (Signed)
Pt reports to the ED for eval of left flank pain that radiates into her abdomen. Pt has hx of stroke. Pt reports movement makes the pain worse. Pt denies any urinary symptoms, or N/V/D. Pt A&Ox4, resp e/u, and skin warm and dry.

## 2015-11-28 NOTE — Discharge Instructions (Signed)
Abdominal Pain, Adult Many things can cause abdominal pain. Usually, abdominal pain is not caused by a disease and will improve without treatment. It can often be observed and treated at home. Your health care provider will do a physical exam and possibly order blood tests and X-rays to help determine the seriousness of your pain. However, in many cases, more time must pass before a clear cause of the pain can be found. Before that point, your health care provider may not know if you need more testing or further treatment. HOME CARE INSTRUCTIONS Monitor your abdominal pain for any changes. The following actions may help to alleviate any discomfort you are experiencing:  Only take over-the-counter or prescription medicines as directed by your health care provider.  Do not take laxatives unless directed to do so by your health care provider.  Try a clear liquid diet (broth, tea, or water) as directed by your health care provider. Slowly move to a bland diet as tolerated. SEEK MEDICAL CARE IF:  You have unexplained abdominal pain.  You have abdominal pain associated with nausea or diarrhea.  You have pain when you urinate or have a bowel movement.  You experience abdominal pain that wakes you in the night.  You have abdominal pain that is worsened or improved by eating food.  You have abdominal pain that is worsened with eating fatty foods.  You have a fever. SEEK IMMEDIATE MEDICAL CARE IF:  Your pain does not go away within 2 hours.  You keep throwing up (vomiting).  Your pain is felt only in portions of the abdomen, such as the right side or the left lower portion of the abdomen.  You pass bloody or black tarry stools. MAKE SURE YOU:  Understand these instructions.  Will watch your condition.  Will get help right away if you are not doing well or get worse.   This information is not intended to replace advice given to you by your health care provider. Make sure you discuss  any questions you have with your health care provider.   Document Released: 09/02/2005 Document Revised: 08/14/2015 Document Reviewed: 08/02/2013 Elsevier Interactive Patient Education 2016 [employer]  Metastatic Cancer Metastatic cancer is cancer that has spread from the place where it started (primary site) to another part of the body. The process of cancer spreading from the primary site is called metastasis. When cancer cells metastasize, they do not change the way they look or the way they affect the body. Primary lung cancer that spreads to the brain is metastatic lung cancer, not brain cancer. Cancer cells can spread:  Directly from one part of the body to a nearby area (local invasion).  Into a lymph vessel and be carried through the lymph system to lymph nodes and other parts of the body. The lymph system is a network of vessels and nodes that carry fluid throughout the body and help to protect against infections.  Into the blood vessels and be carried to other parts of the body through the bloodstream. WHAT TYPES OF CANCER CAN SPREAD? All types of cancer can spread. Some cancers are more likely to metastasize than others. The most common places that cancers metastasize to are:  Bones.  Liver.  Lungs. Cancers that are more advanced when they are diagnosed and treated are more likely to metastasize. Some primary cancers are more likely to metastasize to a specific part of the body. For example:  Breast cancer may spread to:  Bones.  Brain.  Liver.  Lungs.  Lung cancer may spread to:  Bones.  Brain.  Liver.  Adrenal gland.  Other lung.  Melanoma skin cancer may spread to:  Bones.  Brain.  Liver.  Lungs.  Muscles.  Prostate cancer may spread to:  Bones.  Liver.  Lungs.  Adrenal gland.  Colon cancer may spread to:  Tissue that lines the abdominal wall and covers most of the abdominal organs.  Liver.  Lungs. WHAT ARE THE RISKS FOR  METASTATIC CANCER? Your risk for metastatic cancer depends on:  The type of cancer that you have.  The stage and grade of your primary cancer at the time of diagnosis. Tumors are graded by looking at tumor cells under a microscope. Grading predicts how quickly the tumor cells will grow. Your health care provider will use the stage and the grade of your primary cancer to determine the chances of metastasis. This helps your health care provider to find the best treatment for you. Risk for metastasis may go up with:  A larger primary tumor.  A higher grade of tumor.  Deeper growth of tumor.  Lymph node involvement. HOW IS METASTATIC CANCER DIAGNOSED? Your health care provider may suspect metastatic cancer from your signs and symptoms. Some people do not have any symptoms. Their cancer is found through imaging or other tests.  Symptoms may include:  Weakness.  Lack of energy.  Pain.  Weight loss.  Trouble breathing.  Signs may include:  Fluid buildup in your lungs or belly.  Tumor growths that can be felt or seen.  An enlarged liver. Your health care provider will also do a physical exam. This may include:  Blood tests to check for certain substances that are secreted by tumors (tumor markers).  Tumor markers that increase after treatment can indicate metastasis.  Tumor markers may be used to help diagnose metastasis in colon and prostate cancer.  Not all cancers have tumor markers.  Imaging studies, such as:  X-rays.  Ultrasound.  MRI.  Other imaging tests, such as CT scans, bone scans, and PET scans.  Biopsy.  This involves checking a small piece of tissue from a new cancer site to see if the cells are similar to cancer cells from the primary site. This can confirm metastatic cancer.  Biopsies may be done by surgically removing a piece of tissue or using a needle to get a tissue sample.  Testing fluid samples from the lungs, spine, or belly for metastatic  cancer cells. WHAT ARE THE TREATMENT OPTIONS FOR METASTATIC CANCER? There are many options for treating metastatic cancer. Your treatment will depend on:  The type of cancer that you have.  How far your cancer has advanced.  Your general health. Treatment may not be able to cure metastatic cancer, but it can often relieve the symptoms. In many cases, you may have a combination of treatments. Options may include:  Surgery.  Cancer-killing drugs (chemotherapy).  X-ray treatment (radiation therapy).  Hormone therapy.  Treatments that help your body to fight cancer (biologic therapy). CAN METASTATIC CANCER BE PREVENTED? The only way to prevent metastatic cancer is to find your primary cancer early and treat it successfully. Talk with your health care provider about cancer screening. Screening exams for early detection are available for some types of cancer, including:  Breast.  Colon.  Prostate.  Lung.  Cervical. HOW CAN I LEARN MORE? The following websites provide more information.  [place] ([place]) http://www.hicks.com/  [facility] ([facility]) http://www.cancer.org/treatment/understandingyourdiagnosis/advancedcancer/advanced-cancer-what-is-metastatic   This  information is not intended to replace advice given to you by your health care provider. Make sure you discuss any questions you have with your health care provider.   Document Released: 03/30/2005 Document Revised: 12/14/2014 Document Reviewed: 03/08/2014 Elsevier Interactive Patient Education [employer].

## 2015-11-28 NOTE — ED Notes (Signed)
Pt stable, ambulatory, states understanding of discharge instructions 

## 2015-11-28 NOTE — ED Provider Notes (Signed)
CSN: [id number]     Arrival date & time 11/28/15  1821 History   First MD Initiated Contact with Patient 11/28/15 1915     Chief Complaint  Patient presents with  . Flank Pain  . Abdominal Pain     (Consider location/radiation/quality/duration/timing/severity/associated sxs/prior Treatment) Patient is a 64 y.o. female presenting with abdominal pain. The history is provided by the patient.  Abdominal Pain Pain location:  LLQ and L flank Pain quality: aching   Pain radiates to:  Does not radiate Pain severity:  Moderate Onset quality:  Gradual Duration:  12 weeks Timing:  Intermittent Progression:  Waxing and waning Chronicity:  New Context comment:  Pt reports it began after being hospitalized for CVA Relieved by:  Nothing Worsened by:  Movement, palpation and position changes Ineffective treatments:  Acetaminophen, NSAIDs and position changes Associated symptoms: no anorexia, no belching, no chest pain, no chills, no constipation, no cough, no diarrhea, no dysuria, no fatigue, no fever, no flatus, no hematemesis, no hematochezia, no hematuria, no melena, no nausea, no shortness of breath, no sore throat, no vaginal bleeding, no vaginal discharge and no vomiting   Risk factors: recent hospitalization     Past Medical History  Diagnosis Date  . Hypertension   . Arthritis   . A-fib ([place])   . Hyperlipidemia   . Stroke ([place]) 2010  . Breast cancer ([place]) 2008    s/p lumpectomy, chemo and radiation therapy  . Wears glasses   . Anxiety    Past Surgical History  Procedure Laterality Date  . Breast lumpectomy  2008    left  . Appendectomy    . Tubal ligation    . Colonoscopy  2004   No family history on file. Social History  Substance Use Topics  . Smoking status: Never Smoker   . Smokeless tobacco: None  . Alcohol Use: 1.2 oz/week    1 Glasses of wine, 1 Cans of beer per week   OB History    No data available     Review of Systems  Constitutional: Negative for  fever, chills and fatigue.  HENT: Negative for sore throat.   Eyes: Negative for pain.  Respiratory: Negative for cough and shortness of breath.   Cardiovascular: Negative for chest pain.  Gastrointestinal: Positive for abdominal pain. Negative for nausea, vomiting, diarrhea, constipation, blood in stool, melena, hematochezia, abdominal distention, anorexia, flatus and hematemesis.  Genitourinary: Negative for dysuria, hematuria, vaginal bleeding and vaginal discharge.  Musculoskeletal: Negative for neck pain.  Skin: Negative for rash and wound.  Neurological: Positive for speech difficulty and weakness. Negative for dizziness, seizures, light-headedness, numbness and headaches.      Allergies  Codeine  Home Medications   Prior to Admission medications   Medication Sig Start Date End Date Taking? Authorizing Provider  apixaban (ELIQUIS) 5 MG TABS tablet Take 1 tablet (5 mg total) by mouth 2 (two) times daily. 09/11/15  Yes Camelia Eng Tysinger, PA-C  aspirin EC 81 MG tablet Take 81 mg by mouth daily.   Yes Historical Provider, MD  atorvastatin (LIPITOR) 40 MG tablet Take 40 mg by mouth at bedtime.   Yes Historical Provider, MD  metoprolol (LOPRESSOR) 50 MG tablet Take 50 mg by mouth 2 (two) times daily.   Yes Historical Provider, MD  Multiple Vitamins-Minerals (MULTIVITAMIN WITH MINERALS) tablet Take 1 tablet by mouth daily.   Yes Historical Provider, MD  PARoxetine (PAXIL) 20 MG tablet TAKE 1 TABLET BY MOUTH DAILY 06/03/15  Yes Shanon Brow  S Tysinger, PA-C  verapamil (CALAN-SR) 240 MG CR tablet Take 1 tablet (240 mg total) by mouth daily. 07/25/15  Yes David S Tysinger, PA-C   BP 141/74 mmHg  Pulse 100  Temp(Src) 98.7 F (37.1 C) (Oral)  Resp 16  SpO2 100% Physical Exam  Constitutional: She is oriented to person, place, and time. She appears well-developed and well-nourished. No distress.  HENT:  Head: Normocephalic and atraumatic.  Eyes: Conjunctivae and EOM are normal. Pupils are equal,  round, and reactive to light.  Neck: Normal range of motion. Neck supple.  Cardiovascular: Normal rate and regular rhythm.  Exam reveals no gallop and no friction rub.   No murmur heard. Pulmonary/Chest: Effort normal and breath sounds normal. No respiratory distress. She has no wheezes. She has no rales. She exhibits no tenderness.  Abdominal: Soft. Normal appearance and bowel sounds are normal. She exhibits no mass. There is tenderness in the suprapubic area and left lower quadrant. There is no rigidity, no rebound, no guarding, no tenderness at McBurney's point and negative Murphy's sign. No hernia.  Neurological: She is alert and oriented to person, place, and time. GCS eye subscore is 4. GCS verbal subscore is 5. GCS motor subscore is 6.  Pt has slurred speech and RUE and RLE weakness which is chronic 2/2 CVA  Skin: Skin is warm and dry. No rash noted. She is not diaphoretic. No erythema.    ED Course  Procedures (including critical care time) Labs Review Labs Reviewed  COMPREHENSIVE METABOLIC PANEL - Abnormal; Notable for the following:    Glucose, Bld 121 (*)    Albumin 3.1 (*)    Alkaline Phosphatase 144 (*)    Total Bilirubin 1.3 (*)    All other components within normal limits  CBC - Abnormal; Notable for the following:    RBC 3.53 (*)    Hemoglobin 11.3 (*)    HCT 34.1 (*)    All other components within normal limits  URINALYSIS, ROUTINE W REFLEX MICROSCOPIC (NOT AT [facility]) - Abnormal; Notable for the following:    Hgb urine dipstick TRACE (*)    All other components within normal limits  URINE MICROSCOPIC-ADD ON - Abnormal; Notable for the following:    Squamous Epithelial / LPF 0-5 (*)    Bacteria, UA RARE (*)    All other components within normal limits    Imaging Review Ct Abdomen Pelvis W Contrast  11/28/2015  CLINICAL DATA:  Left lower quadrant pain for months. EXAM: CT ABDOMEN AND PELVIS WITH CONTRAST TECHNIQUE: Multidetector CT imaging of the abdomen and pelvis  was performed using the standard protocol following bolus administration of intravenous contrast. CONTRAST:  151mL OMNIPAQUE IOHEXOL 300 MG/ML  SOLN COMPARISON:  None. FINDINGS: Lower chest and abdominal wall:  Right atrial enlargement. Hepatobiliary: 17 mm low-density along the caudate lobe is favored peritoneal.No evidence of biliary obstruction or stone. Pancreas: Unremarkable. Spleen: Unremarkable. Adrenals/Urinary Tract: Negative adrenals. Lobulated bilateral renal cortex from scarring, often postinfectious. Unremarkable bladder. Reproductive:The uterus is indistinct due to surrounding peritoneal masses. Internal coarse calcifications consistent with hyalinized fibroids. Bilateral ovaries are enlarged and heterogeneous, measuring up to 4 cm on the right. It is unclear if this reflects primary mass or peritoneal coating. Stomach/Bowel: No primary mass is seen. Appendectomy. No obstruction. Vascular/Lymphatic: There is pelvic and bilateral inguinal adenopathy which appears malignant. Left inguinal lymph node measures 18 mm. Right external chain lymph node is lobulated and measures 18 mm short axis. Retroperitoneal adenopathy extends to the pelvic  brim. No acute vascular finding. Peritoneal: There are innumerable masses predominately clustered in the pelvis with interloop peritoneal thickening and small ascites. Inferiorly, the omentum as a studded appearance. Musculoskeletal: No acute abnormalities. IMPRESSION: Peritoneal carcinomatosis and pelvic/inguinal adenopathy. Gynecologic primary is favored. Electronically Signed   By: Monte Fantasia M.D.   On: 11/28/2015 21:35   I have personally reviewed and evaluated these images and lab results as part of my medical decision-making.   EKG Interpretation None      MDM   Final diagnoses:  Peritoneal carcinomatosis ([place])  LLQ abdominal pain    64 year old African-American female with past medical history of recent CVA with chronic right-sided weakness  and speech difficulty present in the setting of left lower quadrant and left flank abdominal pain. Patient reports approximately 3 months ago at time of discharge from hospital after CVA she started having left lower quadrant and left flank pain. She reports pain was at site of previous Lovenox injections. She reports pain is significantly worsen trying to get up from seated position. She reports the pain is continued to nag her over this time and it waxes and wanes. Patient denies any chest pain, nausea, vomiting, constipation, diarrhea, dysuria, history of kidney stones, rash, trauma.   On examination patient does have tenderness to palpation in left lower quadrant and left flank area. She has no obvious masses or deformities. No rash present. Patient has no signs of peritonitis. In setting of patient's complaints we'll obtain CBC, CMP, urinalysis and we'll obtain CT abdomen and pelvis with contrast to assess for etiology of pain.  CT scan revealed peritoneal carcinomatosis and pelvic and inguinal adenopathy. Scan concerning for metastatic cancer likely from ovarian source. This is likely cause of patient's pain. Patient was advised of these findings. At this time patient will be discharged home with plan to follow with PCP, OB/GYN, oncology for further management of likely malignancy. Patient did not want pain medications at time of discharge. Patient was given strict return precautions and stable at time of discharge and family was in agreement with plan.  Attending has seen and evaluated patient Dr. Venora Maples is in agreement with plan.  Esaw Grandchild, MD 11/29/15 [id number]  Jola Schmidt, MD 11/29/15 [phone]

## 2015-11-29 ENCOUNTER — Ambulatory Visit: Payer: Medicare Other | Admitting: Physical Therapy

## 2015-11-29 ENCOUNTER — Ambulatory Visit: Payer: Medicare Other

## 2015-12-04 DIAGNOSIS — I639 Cerebral infarction, unspecified: Secondary | ICD-10-CM | POA: Diagnosis not present

## 2015-12-04 DIAGNOSIS — I6930 Unspecified sequelae of cerebral infarction: Secondary | ICD-10-CM | POA: Diagnosis not present

## 2015-12-05 ENCOUNTER — Telehealth: Payer: Self-pay | Admitting: Hematology and Oncology

## 2015-12-05 ENCOUNTER — Ambulatory Visit: Payer: Medicare Other | Admitting: Occupational Therapy

## 2015-12-05 ENCOUNTER — Ambulatory Visit: Payer: Medicare Other | Admitting: Speech Pathology

## 2015-12-05 NOTE — Telephone Encounter (Signed)
New patient appt-s/w patient spouse Brenda Cunningham and gave np appt for 12/30 @ 12:30 w/Dr. Alvy Bimler.  Referring Dr. Helane Rima Dx- Secondary malignant neoplasm of retroperitoneum and peritoneum

## 2015-12-06 ENCOUNTER — Ambulatory Visit: Payer: Medicare Other | Admitting: Occupational Therapy

## 2015-12-06 ENCOUNTER — Encounter: Payer: Self-pay | Admitting: Gynecologic Oncology

## 2015-12-06 ENCOUNTER — Ambulatory Visit: Payer: Medicare Other | Admitting: Hematology and Oncology

## 2015-12-06 ENCOUNTER — Ambulatory Visit: Payer: Medicare Other | Attending: Gynecologic Oncology | Admitting: Gynecologic Oncology

## 2015-12-06 ENCOUNTER — Telehealth: Payer: Self-pay | Admitting: Hematology and Oncology

## 2015-12-06 ENCOUNTER — Ambulatory Visit: Payer: Medicare Other

## 2015-12-06 VITALS — BP 117/63 | HR 68 | Temp 97.8°F | Resp 22 | Ht 69.0 in | Wt 193.1 lb

## 2015-12-06 DIAGNOSIS — C800 Disseminated malignant neoplasm, unspecified: Secondary | ICD-10-CM | POA: Insufficient documentation

## 2015-12-06 DIAGNOSIS — R1909 Other intra-abdominal and pelvic swelling, mass and lump: Secondary | ICD-10-CM | POA: Diagnosis not present

## 2015-12-06 NOTE — Progress Notes (Signed)
Consult Note: Gyn-Onc  Consult was requested by Dr. Venora Maples for the evaluation of Brenda Cunningham 64 y.o. female with presumed metastatic ovarian cancer  CC:  Chief Complaint  Patient presents with  . carcinomatosis    New Consultation    Assessment/Plan:  [name]  is a 64 y.o.  year old with CT findings consistent with metastatic cancer, and carcinomatosis, presumably gynecologic (eg ovarian) in origin.She also has enlarged, bulky retroperitoneal adenopathy including inguinal, which, if ovarian primary, would stage her at IVB.  I am recommended obtaining a needle biopsy of the left inguinal node as this is of easy palpation. She has minimal ascites to aspirate for paracentesis.  Due to the stage IV nature of the disease and unresectable (to optimal status) adenopathy, in addition to her recent stroke, I feel she is a candidate for neoadjuvant chemotherapy followed by interval surgical cytoreduction if she has a good response after 3 cycles of carboplatin and paclitaxel. She will require additional chemotherapy after surgery.   HPI: Brenda Cunningham is a 64 year old G6P6 who is seen in consultation at the request of ED physician, Dr Jola Schmidt for carcinomatosis. The patient reports having a stroke in August 2016 that resulted in expressive aphasia and right upper extremity weakness.  She has been treated for that with aspirin and L a close and is undergoing physical therapy and speech pathology.  The patient began developing vague left lower quadrant and hip discomfort and was seen in the emergency room on 11/29/2015. A CT scan of the abdomen and pelvis was ordered and revealed an indistinct uterus secondary to surrounding peritoneal masses. Internal coarse calcifications consistent with hyalinized fibroids. Bilateral ovaries enlarged and heterogeneous measuring up to 4 cm is on the right. Bilateral pelvic and inguinal adenopathy which appears malignant with a left inguinal lymph node  measuring 1.8 mm and a right external iliac lymph node measuring 18 mm. The retroperitoneal adenopathy extensive pelvic brim. Innumerable masses clustered in the pelvis with interleukin peritoneal thickening and small ascites. The omentum has a started appearance.  The patient denies early satiety and reports mild and vague abdominal bloating. She has a remote history of breast cancer in 2008 for which she was treated with lumpectomy, chemotherapy, and radiation. She is no family history significant for breast or ovarian cancer. She is otherwise treated for atrial fibrillation and has had a remote history of TIAs in 2010. In 2016 she underwent more significant cerebrovascular event which resulted in expressive aphasia and right upper extremity weakness. She also has hypertension and arthritis. Her prior surgical history significant for a left breast lumpectomy, appendectomy, tubal ligation. She's had 6 vaginal deliveries in the past.  She denies vaginal bleeding or history of abnormal Pap smears.  Current Meds:  Outpatient Encounter Prescriptions as of 12/06/2015  Medication Sig  . ALPRAZolam (XANAX) 0.25 MG tablet TK 1 T PO  BID PRA  . apixaban (ELIQUIS) 5 MG TABS tablet Take 1 tablet (5 mg total) by mouth 2 (two) times daily.  Marland Kitchen aspirin EC 81 MG tablet Take 81 mg by mouth daily.  Marland Kitchen atorvastatin (LIPITOR) 40 MG tablet Take 40 mg by mouth at bedtime.  . metoprolol (LOPRESSOR) 50 MG tablet Take 50 mg by mouth 2 (two) times daily.  . Multiple Vitamins-Minerals (MULTIVITAMIN WITH MINERALS) tablet Take 1 tablet by mouth daily.  Marland Kitchen omega-3 acid ethyl esters (LOVAZA) 1 g capsule TK 1 C PO D  . PARoxetine (PAXIL) 20 MG tablet TAKE 1 TABLET  BY MOUTH DAILY  . verapamil (CALAN-SR) 240 MG CR tablet Take 1 tablet (240 mg total) by mouth daily.  . [DISCONTINUED] PARoxetine (PAXIL) 30 MG tablet    No facility-administered encounter medications on file as of 12/06/2015.    Allergy:  Allergies  Allergen  Reactions  . Codeine Hives    Social Hx:   Social History   Social History  . Marital Status: Single    Spouse Name: N/A  . Number of Children: N/A  . Years of Education: N/A   Occupational History  . Not on file.   Social History Main Topics  . Smoking status: Never Smoker   . Smokeless tobacco: Not on file  . Alcohol Use: 1.2 oz/week    1 Glasses of wine, 1 Cans of beer per week  . Drug Use: No  . Sexual Activity: Not on file   Other Topics Concern  . Not on file   Social History Narrative   Lives with her female friend, Mina Marble, walks for exercise.  Originally from [place].    Past Surgical Hx:  Past Surgical History  Procedure Laterality Date  . Breast lumpectomy  2008    left  . Appendectomy    . Tubal ligation    . Colonoscopy  2004    Past Medical Hx:  Past Medical History  Diagnosis Date  . Hypertension   . Arthritis   . A-fib ([place])   . Hyperlipidemia   . Stroke ([place]) 2010  . Breast cancer ([place]) 2008    s/p lumpectomy, chemo and radiation therapy  . Wears glasses   . Anxiety     Past Gynecological History:  SVD x 6  No LMP recorded. Patient is postmenopausal.  Family Hx: History reviewed. No pertinent family history.  Review of Systems:  Constitutional  Feels well,    ENT Normal appearing ears and nares bilaterally Skin/Breast  No rash, sores, jaundice, itching, dryness Cardiovascular  No chest pain, shortness of breath, or edema  Pulmonary  No cough or wheeze.  Gastro Intestinal  No nausea, vomitting, or diarrhoea. No bright red blood per rectum, no abdominal pain, change in bowel movement, or constipation.  Genito Urinary  No frequency, urgency, dysuria,  Musculo Skeletal  Left flank and hip discomfort Neurologic  No weakness, numbness, change in gait,  Psychology  No depression, anxiety, insomnia.   Vitals:  Blood pressure 117/63, pulse 68, temperature 97.8 F (36.6 C), temperature source Oral, resp. rate 22, height 5\' 9"   (1.753 m), weight 193 lb 1.6 oz (87.59 kg), SpO2 100 %.  Physical Exam: WD in NAD Neck  Supple NROM, without any enlargements.  Lymph Node Survey No cervical supraclavicular adenopathy. Left inguinal lymph node that is minimally mobile and 2cm and firm. Cardiovascular  Pulse normal rate, regularity and rhythm. S1 and S2 normal.  Lungs  Clear to auscultation bilateraly, without wheezes/crackles/rhonchi. Good air movement.  Skin  No rash/lesions/breakdown  Psychiatry  Alert and oriented to person, place, and time  Abdomen  Normoactive bowel sounds, abdomen soft, non-tender and overweight without evidence of hernia. Slightly distended. No masses. Back No CVA tenderness Genito Urinary  Vulva/vagina: Normal external female genitalia.  No lesions. No discharge or bleeding.  Bladder/urethra:  No lesions or masses, well supported bladder  Vagina: nodule palpable behind the cervix.  Cervix: Normal appearing, no lesions.  Uterus: Large conglomerate pelvic mass 12cm filling central pelvis, minimally mobile.    Adnexa: as above . Rectal  Good tone, no  masses no cul de sac nodularity.  Extremities  No bilateral cyanosis, clubbing or edema.   Donaciano Eva, MD  12/06/2015, 12:39 PM

## 2015-12-06 NOTE — Telephone Encounter (Signed)
I spoke with Dr. Denman George. This patient was supposed to be scheduled to see me today. The patient likely had primary GYN cancer. Dr. Denman George is arranging for biopsy. We are both in agreement that she should be seen at the GYN oncology clinic and subsequent follow-up with Dr. Marko Plume. I will proceed to cancel her appointment to see me today.

## 2015-12-06 NOTE — Patient Instructions (Signed)
You will receive a phone call from the hospital to schedule a biopsy.  Plan for three cycles of chemotherapy then a scan and to see Dr. Denman George.  We will also schedule you to meet with Dr. Evlyn Clines for chemotherapy.

## 2015-12-10 ENCOUNTER — Encounter: Payer: Self-pay | Admitting: Occupational Therapy

## 2015-12-10 ENCOUNTER — Telehealth: Payer: Self-pay | Admitting: Gynecologic Oncology

## 2015-12-10 ENCOUNTER — Ambulatory Visit: Payer: Medicare Other | Admitting: Occupational Therapy

## 2015-12-10 ENCOUNTER — Ambulatory Visit: Payer: Medicare Other

## 2015-12-10 DIAGNOSIS — I10 Essential (primary) hypertension: Secondary | ICD-10-CM | POA: Diagnosis not present

## 2015-12-10 DIAGNOSIS — G8110 Spastic hemiplegia affecting unspecified side: Secondary | ICD-10-CM

## 2015-12-10 DIAGNOSIS — I63512 Cerebral infarction due to unspecified occlusion or stenosis of left middle cerebral artery: Secondary | ICD-10-CM | POA: Diagnosis not present

## 2015-12-10 DIAGNOSIS — C800 Disseminated malignant neoplasm, unspecified: Secondary | ICD-10-CM | POA: Diagnosis not present

## 2015-12-10 NOTE — Telephone Encounter (Signed)
Called and spoke with the patient's daughter and informed her that Dr. Denman George had cleared her mother to stop taking her Eliquis 2 days before her biopsy.  Also left message for Vivien Rota, with radiology scheduling.  Daughter verbalizing understanding.

## 2015-12-10 NOTE — Therapy (Signed)
[facility] [address] [place], [place], [address] Phone: [phone]   Fax:  [phone]  Patient Details  Name: Brenda Cunningham MRN: [id number] Date of Birth: January 19, 1951 Referring Provider:  No ref. provider found  Encounter Date: 12/10/2015  Pt has recently been diagnosed with Stage IV ovarian cancer. Pt's daughter called today to clarify that for now pt needs to focus on organizing appointments with oncology and get started on chemotherapy.  Pt does not wish to stop therapy permanently but feels this needs to be her first priority.  Will place pt on hold until further notice.   Quay Burow, OTR/L 12/10/2015, 1:54 PM  [facility] [address] [place], [place], [address] Phone: [phone]   Fax:  [phone]

## 2015-12-11 ENCOUNTER — Telehealth: Payer: Self-pay | Admitting: Gynecologic Oncology

## 2015-12-11 NOTE — Telephone Encounter (Signed)
Patient's daughter called if it was ok for her mother to continue taking aspirin 81 mg since she will be holding Eliquis 2 days prior to her procedure.  Called and spoke with Tiffany in IR who stated it would be fine for the patient to continue taking aspirin since it was only 81 mg.  Advised the daughter of this.  Advised her to call for any further questions or concerns.

## 2015-12-12 ENCOUNTER — Telehealth: Payer: Self-pay | Admitting: Hematology

## 2015-12-12 NOTE — Telephone Encounter (Signed)
S/W PT'S DTR IN REF TO NP APPT. ON 12/16/15 DX-OVARIAN CA

## 2015-12-13 NOTE — Therapy (Signed)
[facility] [address] [place], [place], [address] Phone: [phone]   Fax:  [phone]  Patient Details  Name: Brenda Cunningham MRN: [id number] Date of Birth: 1950/12/09 Referring Provider:  No ref. provider found  Encounter Date: 12/13/2015  Pt has recently been diagnosed with Stage IV ovarian cancer. Pt's daughter called earlier this week to clarify that for now pt needs to focus on organizing appointments with oncology and get started on chemotherapy.  Pt does not wish to stop therapy permanently but feels this needs to be her first priority.  ST will place pt on hold until further notice.   [facility], [place], [place] 12/13/2015, 4:09 PM  [facility] [address] [place], [place], [address] Phone: [phone]   Fax:  [phone]

## 2015-12-16 ENCOUNTER — Ambulatory Visit (HOSPITAL_COMMUNITY): Payer: Medicare Other

## 2015-12-16 ENCOUNTER — Encounter: Payer: Medicare Other | Admitting: Occupational Therapy

## 2015-12-16 ENCOUNTER — Ambulatory Visit: Payer: Medicare Other | Admitting: Hematology

## 2015-12-16 ENCOUNTER — Encounter: Payer: Self-pay | Admitting: *Deleted

## 2015-12-16 ENCOUNTER — Other Ambulatory Visit: Payer: Self-pay | Admitting: Radiology

## 2015-12-16 ENCOUNTER — Other Ambulatory Visit: Payer: Self-pay | Admitting: Hematology

## 2015-12-16 NOTE — Progress Notes (Signed)
Spoke with patient's daughter Kenney Houseman about missed MD visit with Dr. Irene Limbo today.  Per Kenney Houseman, today's MD visit was supposed to be cancelled, they were advised to get biopsy prior to meeting with Dr.  Biopsy scheduled for tomorrow 1/10.  Pt also has chemo class 1/13 and MD visit with Dr. Marko Plume 1/17.  Per Joylene John, NP pt has decided to continue care under Dr. Marko Plume.  [facility] staff to contact patient/daughter to follow up.

## 2015-12-17 ENCOUNTER — Ambulatory Visit (HOSPITAL_COMMUNITY)
Admission: RE | Admit: 2015-12-17 | Discharge: 2015-12-17 | Disposition: A | Discharge status: Home / Self Care | Payer: Medicare Other | Source: Ambulatory Visit | Attending: Gynecologic Oncology | Admitting: Gynecologic Oncology

## 2015-12-17 ENCOUNTER — Encounter (HOSPITAL_COMMUNITY): Payer: Self-pay

## 2015-12-17 ENCOUNTER — Ambulatory Visit (HOSPITAL_COMMUNITY): Payer: Medicare Other

## 2015-12-17 ENCOUNTER — Encounter: Payer: Medicare Other | Admitting: Occupational Therapy

## 2015-12-17 DIAGNOSIS — Z853 Personal history of malignant neoplasm of breast: Secondary | ICD-10-CM | POA: Insufficient documentation

## 2015-12-17 DIAGNOSIS — Z7982 Long term (current) use of aspirin: Secondary | ICD-10-CM | POA: Insufficient documentation

## 2015-12-17 DIAGNOSIS — R590 Localized enlarged lymph nodes: Secondary | ICD-10-CM | POA: Diagnosis not present

## 2015-12-17 DIAGNOSIS — E785 Hyperlipidemia, unspecified: Secondary | ICD-10-CM | POA: Diagnosis not present

## 2015-12-17 DIAGNOSIS — Z923 Personal history of irradiation: Secondary | ICD-10-CM | POA: Insufficient documentation

## 2015-12-17 DIAGNOSIS — C800 Disseminated malignant neoplasm, unspecified: Secondary | ICD-10-CM

## 2015-12-17 DIAGNOSIS — I10 Essential (primary) hypertension: Secondary | ICD-10-CM | POA: Insufficient documentation

## 2015-12-17 DIAGNOSIS — I4891 Unspecified atrial fibrillation: Secondary | ICD-10-CM | POA: Diagnosis not present

## 2015-12-17 DIAGNOSIS — I69351 Hemiplegia and hemiparesis following cerebral infarction affecting right dominant side: Secondary | ICD-10-CM | POA: Diagnosis not present

## 2015-12-17 DIAGNOSIS — Z79899 Other long term (current) drug therapy: Secondary | ICD-10-CM | POA: Insufficient documentation

## 2015-12-17 DIAGNOSIS — F419 Anxiety disorder, unspecified: Secondary | ICD-10-CM | POA: Insufficient documentation

## 2015-12-17 DIAGNOSIS — Z7902 Long term (current) use of antithrombotics/antiplatelets: Secondary | ICD-10-CM | POA: Diagnosis not present

## 2015-12-17 DIAGNOSIS — C774 Secondary and unspecified malignant neoplasm of inguinal and lower limb lymph nodes: Secondary | ICD-10-CM | POA: Diagnosis not present

## 2015-12-17 DIAGNOSIS — C801 Malignant (primary) neoplasm, unspecified: Secondary | ICD-10-CM | POA: Diagnosis not present

## 2015-12-17 DIAGNOSIS — Z9221 Personal history of antineoplastic chemotherapy: Secondary | ICD-10-CM | POA: Insufficient documentation

## 2015-12-17 DIAGNOSIS — I69320 Aphasia following cerebral infarction: Secondary | ICD-10-CM | POA: Insufficient documentation

## 2015-12-17 LAB — BASIC METABOLIC PANEL
Anion gap: 9 (ref 5–15)
BUN: 7 mg/dL (ref 6–20)
CO2: 27 mmol/L (ref 22–32)
Calcium: 9.7 mg/dL (ref 8.9–10.3)
Chloride: 107 mmol/L (ref 101–111)
Creatinine, Ser: 0.82 mg/dL (ref 0.44–1.00)
GFR calc Af Amer: >60 mL/min (ref >60)
GFR calc non Af Amer: >60 mL/min (ref >60)
Glucose, Bld: 93 mg/dL (ref 65–99)
Potassium: 3.8 mmol/L (ref 3.5–5.1)
Sodium: 143 mmol/L (ref 135–145)

## 2015-12-17 LAB — CBC WITH DIFFERENTIAL/PLATELET
Basophils Absolute: 0 10*3/uL (ref 0.0–0.1)
Basophils Relative: 0 %
Eosinophils Absolute: 0.1 10*3/uL (ref 0.0–0.7)
Eosinophils Relative: 1 %
HCT: 34.7 % — ABNORMAL LOW (ref 36.0–46.0)
Hemoglobin: 11.5 g/dL — ABNORMAL LOW (ref 12.0–15.0)
Lymphocytes Relative: 20 %
Lymphs Abs: 1.1 10*3/uL (ref 0.7–4.0)
MCH: 31.8 pg (ref 26.0–34.0)
MCHC: 33.1 g/dL (ref 30.0–36.0)
MCV: 95.9 fL (ref 78.0–100.0)
Monocytes Absolute: 0.7 10*3/uL (ref 0.1–1.0)
Monocytes Relative: 13 %
Neutro Abs: 3.8 10*3/uL (ref 1.7–7.7)
Neutrophils Relative %: 66 %
Platelets: 396 10*3/uL (ref 150–400)
RBC: 3.62 MIL/uL — ABNORMAL LOW (ref 3.87–5.11)
RDW: 14.3 % (ref 11.5–15.5)
WBC: 5.8 10*3/uL (ref 4.0–10.5)

## 2015-12-17 LAB — PROTIME-INR
INR: 1.4 (ref 0.00–1.49)
Prothrombin Time: 17.2 seconds — ABNORMAL HIGH (ref 11.6–15.2)

## 2015-12-17 MED ORDER — FENTANYL CITRATE (PF) 100 MCG/2ML IJ SOLN
INTRAMUSCULAR | Status: AC | PRN
  Administered 2015-12-17: 50 ug via INTRAVENOUS

## 2015-12-17 MED ORDER — FLUMAZENIL 0.5 MG/5ML IV SOLN
INTRAVENOUS | Status: AC
  Filled 2015-12-17: qty 5

## 2015-12-17 MED ORDER — HYDROCODONE-ACETAMINOPHEN 5-325 MG PO TABS
1.0000 | ORAL_TABLET | ORAL | Status: DC | PRN
  Filled 2015-12-17: qty 2

## 2015-12-17 MED ORDER — FENTANYL CITRATE (PF) 100 MCG/2ML IJ SOLN
INTRAMUSCULAR | Status: AC
  Filled 2015-12-17: qty 4

## 2015-12-17 MED ORDER — MIDAZOLAM HCL 2 MG/2ML IJ SOLN
INTRAMUSCULAR | Status: AC
  Filled 2015-12-17: qty 6

## 2015-12-17 MED ORDER — NALOXONE HCL 0.4 MG/ML IJ SOLN
INTRAMUSCULAR | Status: AC
  Filled 2015-12-17: qty 1

## 2015-12-17 MED ORDER — MIDAZOLAM HCL 2 MG/2ML IJ SOLN
INTRAMUSCULAR | Status: AC | PRN
  Administered 2015-12-17 (×3): 1 mg via INTRAVENOUS

## 2015-12-17 MED ORDER — SODIUM CHLORIDE 0.9 % IV SOLN: INTRAVENOUS | Status: DC

## 2015-12-17 NOTE — H&P (Signed)
Chief Complaint: Patient was seen in consultation today for  US guided left inguinal lymph node biopsy  Referring Physician(s): Rossi,Emma  History of Present Illness: Brenda Cunningham is a 65 y.o. female with past medical history significant for hypertension, atrial fibrillation, hyperlipidemia, prior TIA's/stroke with subsequent expressive aphasia and right-sided weakness, remote breast cancer and recent history of left lower abdominal discomfort. Recent CT findings are consistent with metastatic cancer and carcinomatosis, presumably gynecologic/ovarian in origin. She has enlarged, bulky retroperitoneal adenopathy including inguinal region. She presents today for ultrasound-guided left inguinal lymph node biopsy.  Past Medical History  Diagnosis Date  . Hypertension   . Arthritis   . A-fib ([place])   . Hyperlipidemia   . Stroke ([facility]) 2010  . Breast cancer ([facility]) 2008    s/p lumpectomy, chemo and radiation therapy  . Wears glasses   . Anxiety     Past Surgical History  Procedure Laterality Date  . Breast lumpectomy  2008    left  . Appendectomy    . Tubal ligation    . Colonoscopy  2004    Allergies: Codeine  Medications: Prior to Admission medications   Medication Sig Start Date End Date Taking? Authorizing Provider  ALPRAZolam (XANAX) 0.25 MG tablet TK 1 T PO  BID PRA 12/03/15   Historical Provider, MD  apixaban (ELIQUIS) 5 MG TABS tablet Take 1 tablet (5 mg total) by mouth 2 (two) times daily. 09/11/15   Carlena Hurl, PA-C  aspirin EC 81 MG tablet Take 81 mg by mouth daily.    Historical Provider, MD  atorvastatin (LIPITOR) 40 MG tablet Take 40 mg by mouth at bedtime.    Historical Provider, MD  metoprolol (LOPRESSOR) 50 MG tablet Take 50 mg by mouth 2 (two) times daily.    Historical Provider, MD  Multiple Vitamins-Minerals (MULTIVITAMIN WITH MINERALS) tablet Take 1 tablet by mouth daily.    Historical Provider, MD  omega-3 acid ethyl esters (LOVAZA) 1 g capsule TK  1 C PO D 09/19/15   Historical Provider, MD  PARoxetine (PAXIL) 20 MG tablet TAKE 1 TABLET BY MOUTH DAILY 06/03/15   Camelia Eng Tysinger, PA-C  verapamil (CALAN-SR) 240 MG CR tablet Take 1 tablet (240 mg total) by mouth daily. 07/25/15   Carlena Hurl, PA-C     No family history on file.  Social History   Social History  . Marital Status: Single    Spouse Name: N/A  . Number of Children: N/A  . Years of Education: N/A   Social History Main Topics  . Smoking status: Never Smoker   . Smokeless tobacco: Not on file  . Alcohol Use: 1.2 oz/week    1 Glasses of wine, 1 Cans of beer per week  . Drug Use: No  . Sexual Activity: Not on file   Other Topics Concern  . Not on file   Social History Narrative   Lives with her female friend, Mina Marble, walks for exercise.  Originally from [place].      Review of Systems  Constitutional: Negative for fever and chills.  Respiratory: Negative for shortness of breath.        Occ cough  Cardiovascular: Negative for chest pain.  Gastrointestinal: Positive for abdominal pain. Negative for nausea, vomiting and blood in stool.  Genitourinary: Negative for dysuria and hematuria.  Musculoskeletal: Negative for back pain.  Neurological: Positive for speech difficulty and weakness. Negative for headaches.    Vital Signs: Blood pressure 121/91, temperature  98.4, heart rate 77, respirations 16, oxygen saturation 100% room air  Physical Exam  Constitutional: She is oriented to person, place, and time. She appears well-developed and well-nourished.  Cardiovascular:  Irregularly irregular  Pulmonary/Chest: Effort normal and breath sounds normal.  Abdominal: Soft. Bowel sounds are normal.  Mild left lower quadrant tenderness to palpation with palpable left inguinal lymph node  Musculoskeletal:  Noted right upper and lower extremity weakness; no significant lower extremity edema  Neurological: She is alert and oriented to person, place, and time.     Mallampati Score:     Imaging: Ct Abdomen Pelvis W Contrast  11/28/2015  CLINICAL DATA:  Left lower quadrant pain for months. EXAM: CT ABDOMEN AND PELVIS WITH CONTRAST TECHNIQUE: Multidetector CT imaging of the abdomen and pelvis was performed using the standard protocol following bolus administration of intravenous contrast. CONTRAST:  155mL OMNIPAQUE IOHEXOL 300 MG/ML  SOLN COMPARISON:  None. FINDINGS: Lower chest and abdominal wall:  Right atrial enlargement. Hepatobiliary: 17 mm low-density along the caudate lobe is favored peritoneal.No evidence of biliary obstruction or stone. Pancreas: Unremarkable. Spleen: Unremarkable. Adrenals/Urinary Tract: Negative adrenals. Lobulated bilateral renal cortex from scarring, often postinfectious. Unremarkable bladder. Reproductive:The uterus is indistinct due to surrounding peritoneal masses. Internal coarse calcifications consistent with hyalinized fibroids. Bilateral ovaries are enlarged and heterogeneous, measuring up to 4 cm on the right. It is unclear if this reflects primary mass or peritoneal coating. Stomach/Bowel: No primary mass is seen. Appendectomy. No obstruction. Vascular/Lymphatic: There is pelvic and bilateral inguinal adenopathy which appears malignant. Left inguinal lymph node measures 18 mm. Right external chain lymph node is lobulated and measures 18 mm short axis. Retroperitoneal adenopathy extends to the pelvic brim. No acute vascular finding. Peritoneal: There are innumerable masses predominately clustered in the pelvis with interloop peritoneal thickening and small ascites. Inferiorly, the omentum as a studded appearance. Musculoskeletal: No acute abnormalities. IMPRESSION: Peritoneal carcinomatosis and pelvic/inguinal adenopathy. Gynecologic primary is favored. Electronically Signed   By: Monte Fantasia M.D.   On: 11/28/2015 21:35    Labs:  CBC:  Recent Labs  07/29/15 2239 07/29/15 2246 11/28/15 1850 12/17/15 1145  WBC 7.6   --  8.4 5.8  HGB 11.9* 14.3 11.3* 11.5*  HCT 34.6* 42.0 34.1* 34.7*  PLT 279  --  331 396    COAGS:  Recent Labs  07/29/15 2239 12/17/15 1145  INR 1.49 1.40  APTT 31  --     BMP:  Recent Labs  07/29/15 2239 07/29/15 2246 11/28/15 1850  NA 141 144 137  K 3.8 3.8 4.1  CL 106 105 103  CO2 24  --  25  GLUCOSE 133* 133* 121*  BUN 13 20 10   CALCIUM 9.5  --  9.7  CREATININE 0.90 0.90 0.79  GFRNONAA >60  --  >60  GFRAA >60  --  >60    LIVER FUNCTION TESTS:  Recent Labs  07/29/15 2239 11/28/15 1850  BILITOT 0.8 1.3*  AST 21 23  ALT 19 22  ALKPHOS 95 144*  PROT 7.5 7.3  ALBUMIN 3.7 3.1*    TUMOR MARKERS: No results for input(s): AFPTM, CEA, CA199, CHROMGRNA in the last 8760 hours.  Assessment and Plan: 65 y.o. female with past medical history significant for hypertension, atrial fibrillation, hyperlipidemia, prior TIA's/stroke with subsequent expressive aphasia and right-sided weakness, remote breast cancer and recent history of left lower abdominal discomfort. Recent CT findings are consistent with metastatic cancer and carcinomatosis, presumably gynecologic/ovarian in origin. She has enlarged, bulky retroperitoneal  adenopathy including inguinal region. She presents today for ultrasound-guided left inguinal lymph node biopsy.Risks and benefits discussed with the patient/family including, but not limited to bleeding, infection, damage to adjacent structures or low yield requiring additional tests.All of the patient's questions were answered, patient is agreeable to proceed.Consent signed and in chart.      Thank you for this interesting consult.  I greatly enjoyed meeting Brenda Cunningham and look forward to participating in their care.  A copy of this report was sent to the requesting provider on this date.  Signed: D. Rowe Robert 12/17/2015, 12:21 PM   I spent a total of 15 minutes in face to face in clinical consultation, greater than 50% of which was  counseling/coordinating care for ultrasound-guided left inguinal lymph node biopsy

## 2015-12-17 NOTE — Discharge Instructions (Signed)
Needle Biopsy, Care After °These instructions give you information about caring for yourself after your procedure. Your doctor may also give you more specific instructions. Call your doctor if you have any problems or questions after your procedure. °HOME CARE °· Rest as told by your doctor. °· Take medicines only as told by your doctor. °· There are many different ways to close and cover the biopsy site, including stitches (sutures), skin glue, and adhesive strips. Follow instructions from your doctor about: °¨ How to take care of your biopsy site. °¨ When and how you should change your bandage (dressing). °¨ When you should remove your dressing. °¨ Removing whatever was used to close your biopsy site. °· Check your biopsy site every day for signs of infection. Watch for: °¨ Redness, swelling, or pain. °¨ Fluid, blood, or pus. °GET HELP IF: °· You have a fever. °· You have redness, swelling, or pain at the biopsy site, and it lasts longer than a few days. °· You have fluid, blood, or pus coming from the biopsy site. °· You feel sick to your stomach (nauseous). °· You throw up (vomit). °GET HELP RIGHT AWAY IF: °· You are short of breath. °· You have trouble breathing. °· Your chest hurts. °· You feel dizzy or you pass out (faint). °· You have bleeding that does not stop with pressure or a bandage. °· You cough up blood. °· Your belly (abdomen) hurts. °  °This information is not intended to replace advice given to you by your health care provider. Make sure you discuss any questions you have with your health care provider. °  °Document Released: 11/05/2008 Document Revised: 04/09/2015 Document Reviewed: 11/19/2014 °Elsevier Interactive Patient Education ©2016 Elsevier Inc[employer] ° ° ° °Moderate Conscious Sedation, Adult °Sedation is the use of medicines to promote relaxation and relieve discomfort and anxiety. Moderate conscious sedation is a type of sedation. Under moderate conscious sedation you are less alert than  normal but are still able to respond to instructions or stimulation. Moderate conscious sedation is used during short medical and dental procedures. It is milder than deep sedation or general anesthesia and allows you to return to your regular activities sooner. °LET YOUR HEALTH CARE PROVIDER KNOW ABOUT:  °· Any allergies you have. °· All medicines you are taking, including vitamins, herbs, eye drops, creams, and over-the-counter medicines. °· Use of steroids (by mouth or creams). °· Previous problems you or members of your family have had with the use of anesthetics. °· Any blood disorders you have. °· Previous surgeries you have had. °· Medical conditions you have. °· Possibility of pregnancy, if this applies. °· Use of cigarettes, alcohol, or illegal drugs. °RISKS AND COMPLICATIONS °Generally, this is a safe procedure. However, as with any procedure, problems can occur. Possible problems include: °· Oversedation. °· Trouble breathing on your own. You may need to have a breathing tube until you are awake and breathing on your own. °· Allergic reaction to any of the medicines used for the procedure. °BEFORE THE PROCEDURE °· You may have blood tests done. These tests can help show how well your kidneys and liver are working. They can also show how well your blood clots. °· A physical exam will be done.   °· Only take medicines as directed by your health care provider. You may need to stop taking medicines (such as blood thinners, aspirin, or nonsteroidal anti-inflammatory drugs) before the procedure.   °· Do not eat or drink at least 6 hours before the procedure or   as directed by your health care provider. °· Arrange for a responsible adult, family member, or friend to take you home after the procedure. He or she should stay with you for at least 24 hours after the procedure, until the medicine has worn off. °PROCEDURE  °· An intravenous (IV) catheter will be inserted into one of your veins. Medicine will be able to  flow directly into your body through this catheter. You may be given medicine through this tube to help prevent pain and help you relax. °· The medical or dental procedure will be done. °AFTER THE PROCEDURE °· You will stay in a recovery area until the medicine has worn off. Your blood pressure and pulse will be checked.   °·  Depending on the procedure you had, you may be allowed to go home when you can tolerate liquids and your pain is under control. °  °This information is not intended to replace advice given to you by your health care provider. Make sure you discuss any questions you have with your health care provider. °  °Document Released: 08/18/2001 Document Revised: 12/14/2014 Document Reviewed: 07/31/2013 °Elsevier Interactive Patient Education ©2016 Elsevier Inc. ° °

## 2015-12-17 NOTE — Procedures (Signed)
US guided core bx L inguinal LAN 18g x4 to surg path in saline No complication No blood loss. See complete dictation in [facility].

## 2015-12-19 ENCOUNTER — Other Ambulatory Visit (HOSPITAL_COMMUNITY): Payer: Medicare Other

## 2015-12-19 ENCOUNTER — Encounter: Payer: Medicare Other | Admitting: Occupational Therapy

## 2015-12-19 ENCOUNTER — Telehealth: Payer: Self-pay | Admitting: Gynecologic Oncology

## 2015-12-19 ENCOUNTER — Ambulatory Visit (HOSPITAL_COMMUNITY): Payer: Medicare Other

## 2015-12-19 NOTE — Telephone Encounter (Signed)
Called and spoke with the patient and her daughter about biopsy results.  Discussed upcoming appts.  Daughter stating her mother is a breast cancer survivor and received chemo in [place] from Dr. Maudie Mercury.

## 2015-12-20 ENCOUNTER — Other Ambulatory Visit: Payer: Medicare Other

## 2015-12-22 ENCOUNTER — Other Ambulatory Visit: Payer: Self-pay | Admitting: Oncology

## 2015-12-22 DIAGNOSIS — C800 Disseminated malignant neoplasm, unspecified: Secondary | ICD-10-CM

## 2015-12-22 DIAGNOSIS — C569 Malignant neoplasm of unspecified ovary: Secondary | ICD-10-CM

## 2015-12-24 ENCOUNTER — Telehealth: Payer: Self-pay | Admitting: Oncology

## 2015-12-24 ENCOUNTER — Ambulatory Visit (HOSPITAL_BASED_OUTPATIENT_CLINIC_OR_DEPARTMENT_OTHER): Payer: Medicare Other | Admitting: Oncology

## 2015-12-24 ENCOUNTER — Encounter: Payer: Medicare Other | Admitting: Occupational Therapy

## 2015-12-24 ENCOUNTER — Encounter: Payer: Self-pay | Admitting: Oncology

## 2015-12-24 ENCOUNTER — Other Ambulatory Visit (HOSPITAL_BASED_OUTPATIENT_CLINIC_OR_DEPARTMENT_OTHER): Payer: Medicare Other

## 2015-12-24 VITALS — BP 123/72 | HR 60 | Temp 97.8°F | Resp 18 | Ht 68.5 in | Wt 193.8 lb

## 2015-12-24 DIAGNOSIS — I4891 Unspecified atrial fibrillation: Secondary | ICD-10-CM | POA: Diagnosis not present

## 2015-12-24 DIAGNOSIS — R4701 Aphasia: Secondary | ICD-10-CM

## 2015-12-24 DIAGNOSIS — C800 Disseminated malignant neoplasm, unspecified: Secondary | ICD-10-CM | POA: Diagnosis not present

## 2015-12-24 DIAGNOSIS — Z9851 Tubal ligation status: Secondary | ICD-10-CM

## 2015-12-24 DIAGNOSIS — C563 Malignant neoplasm of bilateral ovaries: Secondary | ICD-10-CM

## 2015-12-24 DIAGNOSIS — C569 Malignant neoplasm of unspecified ovary: Secondary | ICD-10-CM | POA: Diagnosis not present

## 2015-12-24 DIAGNOSIS — C774 Secondary and unspecified malignant neoplasm of inguinal and lower limb lymph nodes: Secondary | ICD-10-CM

## 2015-12-24 DIAGNOSIS — G8191 Hemiplegia, unspecified affecting right dominant side: Secondary | ICD-10-CM

## 2015-12-24 DIAGNOSIS — Z8249 Family history of ischemic heart disease and other diseases of the circulatory system: Secondary | ICD-10-CM

## 2015-12-24 DIAGNOSIS — Z853 Personal history of malignant neoplasm of breast: Secondary | ICD-10-CM

## 2015-12-24 DIAGNOSIS — C801 Malignant (primary) neoplasm, unspecified: Secondary | ICD-10-CM | POA: Diagnosis not present

## 2015-12-24 DIAGNOSIS — K635 Polyp of colon: Secondary | ICD-10-CM

## 2015-12-24 DIAGNOSIS — I10 Essential (primary) hypertension: Secondary | ICD-10-CM | POA: Diagnosis not present

## 2015-12-24 DIAGNOSIS — C562 Malignant neoplasm of left ovary: Secondary | ICD-10-CM

## 2015-12-24 DIAGNOSIS — Z8673 Personal history of transient ischemic attack (TIA), and cerebral infarction without residual deficits: Secondary | ICD-10-CM

## 2015-12-24 DIAGNOSIS — C561 Malignant neoplasm of right ovary: Secondary | ICD-10-CM

## 2015-12-24 LAB — COMPREHENSIVE METABOLIC PANEL
ALT: 22 U/L (ref 0–55)
AST: 25 U/L (ref 5–34)
Albumin: 3 g/dL — ABNORMAL LOW (ref 3.5–5.0)
Alkaline Phosphatase: 147 U/L (ref 40–150)
Anion Gap: 9 mEq/L (ref 3–11)
BUN: 9.7 mg/dL (ref 7.0–26.0)
CO2: 26 mEq/L (ref 22–29)
Calcium: 9.8 mg/dL (ref 8.4–10.4)
Chloride: 106 mEq/L (ref 98–109)
Creatinine: 0.8 mg/dL (ref 0.6–1.1)
EGFR: 87 mL/min/{1.73_m2} — ABNORMAL LOW (ref >90)
Glucose: 91 mg/dl (ref 70–140)
Potassium: 4.1 mEq/L (ref 3.5–5.1)
Sodium: 141 mEq/L (ref 136–145)
Total Bilirubin: 0.95 mg/dL (ref 0.20–1.20)
Total Protein: 7.9 g/dL (ref 6.4–8.3)

## 2015-12-24 LAB — CBC WITH DIFFERENTIAL/PLATELET
BASO%: 0.8 % (ref 0.0–2.0)
Basophils Absolute: 0 10*3/uL (ref 0.0–0.1)
EOS%: 2.8 % (ref 0.0–7.0)
Eosinophils Absolute: 0.2 10*3/uL (ref 0.0–0.5)
HCT: 35.8 % (ref 34.8–46.6)
HGB: 11.5 g/dL — ABNORMAL LOW (ref 11.6–15.9)
LYMPH%: 19.1 % (ref 14.0–49.7)
MCH: 31 pg (ref 25.1–34.0)
MCHC: 32.2 g/dL (ref 31.5–36.0)
MCV: 96.4 fL (ref 79.5–101.0)
MONO#: 0.5 10*3/uL (ref 0.1–0.9)
MONO%: 10.2 % (ref 0.0–14.0)
NEUT#: 3.6 10*3/uL (ref 1.5–6.5)
NEUT%: 67.1 % (ref 38.4–76.8)
Platelets: 316 10*3/uL (ref 145–400)
RBC: 3.72 10*6/uL (ref 3.70–5.45)
RDW: 14.4 % (ref 11.2–14.5)
WBC: 5.3 10*3/uL (ref 3.9–10.3)
lymph#: 1 10*3/uL (ref 0.9–3.3)

## 2015-12-24 MED ORDER — LORAZEPAM 0.5 MG PO TABS: ORAL_TABLET | ORAL | Status: DC

## 2015-12-24 MED ORDER — DEXAMETHASONE 4 MG PO TABS: ORAL_TABLET | ORAL | Status: DC

## 2015-12-24 MED ORDER — ONDANSETRON HCL 8 MG PO TABS: 8.0000 mg | ORAL_TABLET | Freq: Three times a day (TID) | ORAL | Status: DC | PRN

## 2015-12-24 NOTE — Patient Instructions (Signed)
We will send prescriptions to your pharmacy   Zofran (ondansetron)  8 mg   One every 8 hrs as needed for nausea after chemo. This will not make you drowsy  Ativan (lorazepam)  0.5 mg  One every 6 hrs as needed for nausea after chemo. This will make you drowsy. Do not take with valium  Decadron (dexamethasone, steroid)   4 mg   Five tablets with food 12 hrs before each chemo, to decrease chance of allergy reactions to the taxol   Call if any questions or problems   [phone]

## 2015-12-24 NOTE — Telephone Encounter (Signed)
Appointments made and avs printed for paient °

## 2015-12-24 NOTE — Progress Notes (Signed)
[facility] ONCOLOGY  Name: Brenda Cunningham Date: December 24, 2015  MRN: [id number] DOB: 10/22/1951  REFERRING PHYSICIAN   Everitt Amber CC: Lendon Collar, neurologist with [facility] in [place], Christen Butter, Carol Ada     (PCP Dr Marcello Moores in [place], [place]; oncologist Dr Maudie Mercury in [place], [place]; breast radiation given at [facility] in [place]). 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 I do not have records from [place] or records relating to previous breast cancer.   HISTORY OF PRESENT ILLNESS:Brenda Cunningham is a 65 y.o. female who is seen in consultation, together with daughter, son, sister and significant other , at the request of  Dr Everitt Amber, for consideration of chemotherapy as initial intervention for recently diagnosed advanced high grade gyn carcinoma. 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 She has history of left breast cancer 2005 treated in [place] with lumpectomy, chemotherapy and radiation, none of those records presently available.  Patient presented to ED in [place] 11-28-15 with LLQ pain and left hip pain, symptomatic for at least a year per family but worse. CT AP 11-28-15 had bilateral ovarian masses up to 4 cm on right,  with peritoneal carcinomatosis in pelvis, omentum and perihepatic, with small ascites, bilateral pelvic and inguinal adenopathy, with retroperitoneal adenopathy extending to pelvic brim. 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 Recommendation was to begin treatment with carboplatin taxol, with reevaluation after 3 cycles for possible interval debulking surgery.  She and family attended chemotherapy teaching class prior to this visit.      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 She did home and then outpatient PT/ OT and speech therapy following CVA, but has stopped all of this since cancer diagnosis; she is not doing exercises as recommended at home, encouraged her to keep up with these. She complains particularly of problems with RLE since CVA; she does not complain now of abdominal or pelvic discomfort including LLQ abdominal pain or left hip pain now. Appetite seems a little diminished, usual good weight 220 lbs. No abdominal swelling. Bowels not moving quite daily now, no diarrhea or bleeding, does not seem uncomfortable with this. Last colonoscopy in [place] 08-2014 Dr Benson Norway with 6-7 sessile polyps thruout colon. Denies N/V. No LE swelling. No bleeding. No HA. Good visual acuity with glasses. Hearing good. No dental complaints, does not have dentist in [place]. No known thyroid problems. Denies bladder problems. Is left handed. No significant arthritis. No peripheral neuropathy. RUE more weak than RLE. Denies changes in breasts bilaterally. Peripheral IV access seems good, did not have PAC for breast cancer chemotherapy in [place]. Remainder of full 10 point review of systems negative.   ALLERGIES: Codeine  PAST MEDICAL/ SURGICAL HISTORY:    G6P6 CVA 07-27-15 with expressive aphasia, right  hemiplegia A fib previously on Xarelto, now Eliquis and ASA HTN Degenerative arthritis Left breast cancer 2005: lumpectomy and apparently axillary node evaluation (axillary scar),  chemotherapy and radiation [facility]ammograms [facility] scattered densities otherwise not remarkable Flu vaccine (high dose) by Dr Lavone Neri 10-10-15   CURRENT MEDICATIONS: reviewed as listed now in EMR. Uses valium ~ 2x daily prn for anxiety since cancer diagnosis and with CVA.  PHARMACY [facility]  SOCIAL HISTORY:  Single, lives with female significant other who has chronic orthopedic problems; daughter lives nearby. Originally from [place], in [place] since [place] 2-13. 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 Never smoker. Very little past ETOH, none now. 38 grands and a number of great grands, in [place] and [place].    FAMILY HISTORY:   Negative for breast or ovarian cancer 2 sisters with MIs ages 93 and 53 Brother with cardiac disease Mother deceased, medical history not known Father died of alcoholic cirrhosis Children healthy         PHYSICAL EXAM:  height is 5' 8.5" (1.74 m) and weight is 193 lb 12.8 oz (87.907 kg). Her oral temperature is 97.8 F (36.6 C). Her blood pressure is 123/72 and her pulse is 60. Her respiration is 18 and oxygen saturation is 100%.  Alert, pleasant, cooperative lady looks stated age, not in obvious discomfort. Respirations not labored. Well nourished. Right hemiparesis, expressive aphasia with perseverance. Ambulatory with some difficulty, needs assistance on and off exam table with right hemiparesis.   HEENT:PERRL, not icteric. Oral mucosa moist and clear. Dentition ok. Neck supple without JVD or thyroid mass.   RESPIRATORY: lungs clear to A and P.  CARDIAC/ VASCULAR: Heart slightly irregular, no gallop, clear heart sounds. Peripheral pulses intact and symmetrical  ABDOMEN: soft, not tender, a few BS, not tightly distended, no obvious fluid wave, no HSM. Not tender in epigastrium or LLQ.  LYMPH NODES: no cervical, supraclavicular, axillary adenopathy. No right inguinal nodes. Firm left  inguinal node 2 cm not tender, not mobile.  BREASTS: left lumpectomy scar well healed, also healed scar left lower axilla. Breasts bilaterally without dominant mass, skin or nipple findings of concern.  NEUROLOGIC: right hemiparesis, RUE moreso than RLE. Expressive aphasia. Comprehension seems good. Psych: appropriate mood and affect.   SKIN:without rash, ecchymosis, petechiae  MUSCULOSKELETAL: back not tender. Muscle mass decreased RUE with some contractures right hand    LABORATORY DATA:  Results for orders placed or performed in visit on 12/24/15 (from the past 48 hour(s))  CBC with Differential     Status: Abnormal   Collection Time: 12/24/15  9:23 AM  Result Value Ref Range   WBC 5.3 3.9 - 10.3 10e3/uL   NEUT# 3.6 1.5 - 6.5 10e3/uL   HGB 11.5 (L) 11.6 - 15.9 g/dL   HCT 35.8 34.8 - 46.6 %   Platelets 316 145 - 400 10e3/uL   MCV 96.4 79.5 - 101.0 fL   MCH 31.0 25.1 - 34.0 pg   MCHC 32.2 31.5 - 36.0 g/dL   RBC 3.72 3.70 - 5.45 10e6/uL   RDW 14.4 11.2 - 14.5 %   lymph# 1.0 0.9 - 3.3 10e3/uL   MONO# 0.5 0.1 - 0.9 10e3/uL   Eosinophils Absolute 0.2 0.0 - 0.5 10e3/uL   Basophils Absolute 0.0 0.0 - 0.1 10e3/uL   NEUT% 67.1 38.4 - 76.8 %   LYMPH% 19.1 14.0 - 49.7 %   MONO% 10.2  0.0 - 14.0 %   EOS% 2.8 0.0 - 7.0 %   BASO% 0.8 0.0 - 2.0 %  Comprehensive metabolic panel     Status: Abnormal   Collection Time: 12/24/15  9:24 AM  Result Value Ref Range   Sodium 141 136 - 145 mEq/L   Potassium 4.1 3.5 - 5.1 mEq/L   Chloride 106 98 - 109 mEq/L   CO2 26 22 - 29 mEq/L   Glucose 91 70 - 140 mg/dl    Comment: Glucose reference range is for nonfasting patients. Fasting glucose reference range is 70- 100.   BUN 9.7 7.0 - 26.0 mg/dL   Creatinine 0.8 0.6 - 1.1 mg/dL   Total Bilirubin 0.95 0.20 - 1.20 mg/dL   Alkaline Phosphatase 147 40 - 150 U/L   AST 25 5 - 34 U/L   ALT 22 0 - 55 U/L   Total Protein 7.9 6.4 - 8.3 g/dL   Albumin 3.0 (L) 3.5 - 5.0 g/dL   Calcium 9.8 8.4 - 10.4 mg/dL    Anion Gap 9 3 - 11 mEq/L   EGFR 87 (L) >90 ml/min/1.73 m2    Comment: eGFR is calculated using the CKD-EPI Creatinine Equation (2009)     CA 125 available after visit 608   PATHOLOGY: Addendum FINAL for BITA, CARTWRIGHT (DIY64-15.8) Patient: Brenda, Cunningham Collected: 12/17/2015 Client: [facility] Accession: [id number] Received: 12/17/2015 D. Arne Cleveland  REPORT OF SURGICAL PATHOLOGY REASON FOR ADDENDUM, AMENDMENT OR CORRECTION: [id number]: Addendum issued to report immunohistochemistry results. kh 12/19/15 09:51:50 AM FINAL DIAGNOSIS Diagnosis Lymph node, needle/core biopsy, L inguinal LAN - METASTATIC ADENOCARCINOMA. Microscopic Comment Immunohistochemistry will be performed and reported as an addendum. (JDP:ecj 12/18/2015) ADDENDUM: Immunohistochemistry shows strong positivity with cytokeratin 7, estrogen receptor (ER), progesterone receptor (PR), p53 and WT1. Negative markers are cytokeratin 20 , CDX- 2 and gross cystic disease fluid protein. 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  RADIOGRAPHY: EXAM: CT ABDOMEN AND PELVIS WITH CONTRAST 11-28-15  COMPARISON: None.  FINDINGS: Lower chest and abdominal wall: Right atrial enlargement.  Hepatobiliary: 17 mm low-density along the caudate lobe is favored peritoneal.No evidence of biliary obstruction or stone.  Pancreas: Unremarkable.  Spleen: Unremarkable.  Adrenals/Urinary Tract: Negative adrenals. Lobulated bilateral renal cortex from scarring, often postinfectious. Unremarkable bladder.  Reproductive:The uterus is indistinct due to surrounding peritoneal masses. Internal coarse calcifications consistent with hyalinized fibroids. Bilateral ovaries are enlarged and heterogeneous, measuring up to 4 cm on the right. It is unclear if this reflects primary mass or peritoneal  coating.  Stomach/Bowel: No primary mass is seen. Appendectomy. No obstruction.  Vascular/Lymphatic: There is pelvic and bilateral inguinal adenopathy which appears malignant. Left inguinal lymph node measures 18 mm. Right external chain lymph node is lobulated and measures 18 mm short axis. Retroperitoneal adenopathy extends to the pelvic brim. No acute vascular finding.  Peritoneal: There are innumerable masses predominately clustered in the pelvis with interloop peritoneal thickening and small ascites. Inferiorly, the omentum as a studded appearance.  Musculoskeletal: No acute abnormalities.  IMPRESSION: Peritoneal carcinomatosis and pelvic/inguinal adenopathy. Gynecologic primary is favored.   US Biopsy  12-17-15 FINDINGS: Enlarged left inguinal lymph node measuring up to the 2.2 cm, corresponding to findings on recent CT. Ultrasound-guided core biopsy samples obtained.  IMPRESSION: 1. Technically successful ultrasound guided core left inguinal adenopathy biopsy.   DISCUSSION: All of history above reviewed with patient and family, including circumstances surrounding her presentation, evaluation as  above and confirmation of clinical diagnosis by pathology of left inguinal node biopsy. I have told pateint and family that primary goal of therapy will be to improve and control disease, but that it is not likely that she will have long term CR. They understand that Dr Denman George will consider interval debulking surgery if she has excellent response to initial chemotherapy.  We have discussed q 3 week vs weekly carbo taxol. Patient and family are most comfortable with the weekly dosing initially, particularly given her recent comorbid conditions. I would like to stay in contact with her neurologist and cardiologist, as well as Dr Lavone Neri. We have discussed antiemetics and premedication decadron. I have given them written instructions additionally. We have discussed possible taxol  aches, possible taxol reactions, possible taxol peripheral neuropathy. She would like to begin treatment with peripheral IVs if adequate.   I have told them that adjuvant chemotherapy for breast cancer probably was not with these drugs, tho no specific maximum total dose of these agents.   Consent given for chemotherapy   IMPRESSION / PLAN:   1.IVB high grade gyn adenocarcinoma likely ovarian primary, with biopsy proven involvement of left inguinal node. Patient is mildly symptomatic with LLQ pain. Will begin chemotherapy with dose dense carbo taxol end of this week or early next depending on preauthorization and infusion availability. I will see her back within the week following first treatment, with labs. 2. 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 Hopefully will be able to resume outpatient PT/ OT/ speech therapy in near future. Followed by neurology in [facility] 3.history of TIAs 2010 4.atrial fibrillation diagnosed ~ 2011, previously on xeloda and now Eliquis + ASA. Followed ? By Dr Einar Gip or by other cardiologist ? May need to hold anticoagulation if chemotherapy related thrombocytopenia. 5.Left breast cancer 2005 diagnosed and treated in [place] with lumpectomy and presumably axillary node evaluation, combination chemotherapy (drugs not known) via peripheral veins, local radiation. Up to date on mammograms, done 10-18-15 at [facility] 6.hx HTN 7.colon polyps at colonoscopy by Dr Benson Norway 2015. Follow up not specified on the report in EMR 8.post appendectomy, BTL 9.family history of cardiac disease with MIs in sisters ages 59 and 26, and in brother 10.flu vaccine done 10-2015   Patient and accompanying individuals have had all questions answered to their satisfaction and are in agreement with plan above. They can contact this office for questions or concerns at any time prior to next scheduled visit. Chemo orders entered, managed care notified for preauthorization.  Granix also requested in case needed.  CC Drs Julien Girt Time spent  75 min, including >50% discussion and coordination of care.    Rudi Knippenberg P, MD 12/24/2015 1:33 PM

## 2015-12-25 ENCOUNTER — Telehealth: Payer: Self-pay

## 2015-12-25 DIAGNOSIS — C562 Malignant neoplasm of left ovary: Secondary | ICD-10-CM

## 2015-12-25 LAB — CA 125: Cancer Antigen (CA) 125: 608.2 U/mL — ABNORMAL HIGH (ref 0.0–38.1)

## 2015-12-25 NOTE — Telephone Encounter (Signed)
-----   Message from Gordy Levan, MD sent at 12/24/2015 12:11 PM EST ----- [facility]  Ondansetron 8 mg   1 every 8 hrs prn nausea after chemo, will not make drowsy   #30  1 RF  Lorazepam  0.5 mg   One SL or po every 6 hrs prn nausea Will make drowsy  #20 no RF  Decadron 4 mg   Five tablets = 20 mg with food 12 hrs before taxol #5 for first treatment Will need refill for weekly treatments after this first one, OK for #15 for 3 weeks then   RN please call daughter later this week. Review meds above and mention taxol aches, which hopefully will not be too severe at lower weekly dosing, but I may not have emphasized during our discussion.  thanks

## 2015-12-25 NOTE — Telephone Encounter (Signed)
Compazine 10 mg q 6 hr prn nausea #20  1 RF Suggest filling the ativan to have available in case needed, especially if zofran not available. Thank you

## 2015-12-25 NOTE — Telephone Encounter (Signed)
Reviewed the decadron premedication with [name] and her daughter as noted below by Dr. Marko Plume. Discussed using Claritin 10 mg for ~7 days around the chemotherapy to help with the Taxol aches.  Reviewed using electric blanket or heating pad for aches. The Zofran needs a prior authorization.   [name] does not want to use the Ativan for nausea as it may make her sleepy and forgetful around dosing. She uses xanax bid for anxiety and does not get sleepy. She does not want to use ativan for nausea and hold the xanax dose either.  Will discuss calling in another nausea med with Dr. Marko Plume so [name] has a nausea tablet on hand if the Zofran not approved by 12-27-15. Daughter inquired  About having a few pain pills available if tylenol and other interventions for the taxol aches is not suffient for her mother's comfort.

## 2015-12-26 ENCOUNTER — Encounter: Payer: Medicare Other | Admitting: Occupational Therapy

## 2015-12-26 MED ORDER — PROCHLORPERAZINE MALEATE 10 MG PO TABS: 10.0000 mg | ORAL_TABLET | Freq: Four times a day (QID) | ORAL | Status: DC | PRN

## 2015-12-26 NOTE — Addendum Note (Signed)
Addended by: Janace Hoard on: 12/26/2015 10:44 AM   Modules accepted: Orders

## 2015-12-26 NOTE — Telephone Encounter (Addendum)
lvm with daughter that compazine was e-scribed. Per Dr Edwyna Shell she may want to fill the ativan just is case it is needed. Pt did not answer her phone.

## 2015-12-27 ENCOUNTER — Ambulatory Visit (HOSPITAL_BASED_OUTPATIENT_CLINIC_OR_DEPARTMENT_OTHER): Payer: Medicare Other

## 2015-12-27 VITALS — BP 102/71 | HR 60 | Temp 97.9°F | Resp 18

## 2015-12-27 DIAGNOSIS — Z5111 Encounter for antineoplastic chemotherapy: Secondary | ICD-10-CM

## 2015-12-27 DIAGNOSIS — C569 Malignant neoplasm of unspecified ovary: Secondary | ICD-10-CM | POA: Insufficient documentation

## 2015-12-27 DIAGNOSIS — G8191 Hemiplegia, unspecified affecting right dominant side: Secondary | ICD-10-CM | POA: Insufficient documentation

## 2015-12-27 DIAGNOSIS — K635 Polyp of colon: Secondary | ICD-10-CM | POA: Insufficient documentation

## 2015-12-27 DIAGNOSIS — C563 Malignant neoplasm of bilateral ovaries: Secondary | ICD-10-CM

## 2015-12-27 DIAGNOSIS — C561 Malignant neoplasm of right ovary: Secondary | ICD-10-CM

## 2015-12-27 DIAGNOSIS — I639 Cerebral infarction, unspecified: Secondary | ICD-10-CM | POA: Insufficient documentation

## 2015-12-27 DIAGNOSIS — C562 Malignant neoplasm of left ovary: Secondary | ICD-10-CM | POA: Diagnosis not present

## 2015-12-27 DIAGNOSIS — Z9851 Tubal ligation status: Secondary | ICD-10-CM | POA: Insufficient documentation

## 2015-12-27 DIAGNOSIS — R4702 Dysphasia: Secondary | ICD-10-CM | POA: Insufficient documentation

## 2015-12-27 DIAGNOSIS — Z853 Personal history of malignant neoplasm of breast: Secondary | ICD-10-CM | POA: Insufficient documentation

## 2015-12-27 MED ORDER — SODIUM CHLORIDE 0.9 % IV SOLN
Freq: Once | INTRAVENOUS | Status: AC
  Administered 2015-12-27: 12:00:00 via INTRAVENOUS

## 2015-12-27 MED ORDER — DIPHENHYDRAMINE HCL 50 MG/ML IJ SOLN
25.0000 mg | Freq: Once | INTRAMUSCULAR | Status: AC
  Administered 2015-12-27: 25 mg via INTRAVENOUS

## 2015-12-27 MED ORDER — PALONOSETRON HCL INJECTION 0.25 MG/5ML
INTRAVENOUS | Status: AC
  Filled 2015-12-27: qty 5

## 2015-12-27 MED ORDER — FAMOTIDINE IN NACL 20-0.9 MG/50ML-% IV SOLN
INTRAVENOUS | Status: AC
  Filled 2015-12-27: qty 50

## 2015-12-27 MED ORDER — PACLITAXEL CHEMO INJECTION 300 MG/50ML
80.0000 mg/m2 | Freq: Once | INTRAVENOUS | Status: AC
  Administered 2015-12-27: 162 mg via INTRAVENOUS
  Filled 2015-12-27: qty 27

## 2015-12-27 MED ORDER — SODIUM CHLORIDE 0.9 % IV SOLN
509.0000 mg | Freq: Once | INTRAVENOUS | Status: AC
  Administered 2015-12-27: 510 mg via INTRAVENOUS
  Filled 2015-12-27: qty 51

## 2015-12-27 MED ORDER — SODIUM CHLORIDE 0.9 % IV SOLN
20.0000 mg | Freq: Once | INTRAVENOUS | Status: AC
  Administered 2015-12-27: 20 mg via INTRAVENOUS
  Filled 2015-12-27: qty 2

## 2015-12-27 MED ORDER — FAMOTIDINE IN NACL 20-0.9 MG/50ML-% IV SOLN
20.0000 mg | Freq: Once | INTRAVENOUS | Status: AC
  Administered 2015-12-27: 20 mg via INTRAVENOUS

## 2015-12-27 MED ORDER — DIPHENHYDRAMINE HCL 50 MG/ML IJ SOLN
INTRAMUSCULAR | Status: AC
  Filled 2015-12-27: qty 1

## 2015-12-27 MED ORDER — PALONOSETRON HCL INJECTION 0.25 MG/5ML
0.2500 mg | Freq: Once | INTRAVENOUS | Status: AC
  Administered 2015-12-27: 0.25 mg via INTRAVENOUS

## 2015-12-27 NOTE — Patient Instructions (Signed)
[facility] Discharge Instructions for Patients Receiving Chemotherapy  Today you received the following chemotherapy agents taxol and carboplatin.  To help prevent nausea and vomiting after your treatment, we encourage you to take your nausea medication compazine or ativan. On Monday, you may start using zofran for nausea.   If you develop nausea and vomiting that is not controlled by your nausea medication, call the clinic.   BELOW ARE SYMPTOMS THAT SHOULD BE REPORTED IMMEDIATELY:  *FEVER GREATER THAN 100.5 F  *CHILLS WITH OR WITHOUT FEVER  NAUSEA AND VOMITING THAT IS NOT CONTROLLED WITH YOUR NAUSEA MEDICATION  *UNUSUAL SHORTNESS OF BREATH  *UNUSUAL BRUISING OR BLEEDING  TENDERNESS IN MOUTH AND THROAT WITH OR WITHOUT PRESENCE OF ULCERS  *URINARY PROBLEMS  *BOWEL PROBLEMS  UNUSUAL RASH Items with * indicate a potential emergency and should be followed up as soon as possible.  Feel free to call the clinic you have any questions or concerns. The clinic phone number is [phone].  Please show the [facility] at check-in to the Emergency Department and triage nurse.

## 2015-12-27 NOTE — Progress Notes (Signed)
Patient has very poor venous access.  Right side is effect somewhat by previous stroke.  Right hand seems slightly puffy and is not a good option for IV access.  There were 3 unsuccessful attempts at IV start before 4th attempt which was successful.  Discussed PAC with patient and dtr. And patient states they will talk about it this weekend. Patient discussed with dtr.,Brenda Cunningham and has decided she would like a PAC- called Dr. Mariana Kaufman nurse, Barbaraann Share and let her know.

## 2015-12-27 NOTE — Telephone Encounter (Signed)
Called [facility] and requested that the Zofran prescription be ran to see if approved as no note from managed care regarding approval status. Zofran prescription approved and went through.   Spoke with patient and daughter Brenda Cunningham in the infusion room and told then that the Zofran prescription was approved and being filled.

## 2015-12-30 ENCOUNTER — Encounter: Payer: Medicare Other | Admitting: Occupational Therapy

## 2015-12-30 ENCOUNTER — Ambulatory Visit: Payer: Medicare Other | Admitting: Physical Medicine & Rehabilitation

## 2015-12-30 DIAGNOSIS — G8110 Spastic hemiplegia affecting unspecified side: Secondary | ICD-10-CM

## 2015-12-30 NOTE — Therapy (Signed)
[facility] [address] [place], [place], [address] Phone: [phone]   Fax:  [phone]  Occupational Therapy Treatment  Patient Details  Name: Brenda Cunningham MRN: [id number] Date of Birth: May 28, 1951 Referring Provider: Reece Levy  Encounter Date: 12/30/2015    Past Medical History  Diagnosis Date  . Hypertension   . Arthritis   . A-fib ([place])   . Hyperlipidemia   . Stroke ([place]) 2010  . Breast cancer ([place]) 2008    s/p lumpectomy, chemo and radiation therapy  . Wears glasses   . Anxiety     Past Surgical History  Procedure Laterality Date  . Breast lumpectomy  2008    left  . Appendectomy    . Tubal ligation    . Colonoscopy  2004    There were no vitals filed for this visit.  Visit Diagnosis:  Spastic hemiplegia affecting nondominant side ([place])                              OT Short Term Goals - 12/30/15 1220    OT SHORT TERM GOAL #1   Title Following facilitation,Patient will demonstrate active finger flexion through 30% available range as skill leading to pre grasp ability.  10/15/2015   Time 4   Period Weeks   Status Achieved   OT SHORT TERM GOAL #2   Title Patient will demonstrate active release of finger flexion to begin to release 2 inch or larger objects from right hand   Time 4   Period Weeks   Status Achieved   OT SHORT TERM GOAL #3   Title Patient will demonstrate ability to flex shoulder to 120 degrees in preparation for reaching into cabinet for shelf at eye level with right arm   Time 4   Period Weeks   Status Achieved   OT SHORT TERM GOAL #4   Title Patient will feed self with modified independence   Time 4   Period Weeks   Status Achieved   OT SHORT TERM GOAL #5   Title Patient will complete grooming with modified independence   Time 4   Period Weeks   Status Achieved           OT Long Term Goals - 12/30/15 1220    OT LONG TERM GOAL #1   Title Patient will grasp and release small object 1-3 inches with right hand with minimal cueing / facilitation.  11/12/2015   Status Achieved   OT LONG TERM GOAL #2   Title Patient will reach into overhead shelf and retrieve a lightweight (1/2-1lb) object with minimal cueing / facilitation - 12/10/2015 (carried over for renewal)   Status On-going   OT LONG TERM GOAL #3   Title Patient will be able to safely don/doff splint for right hand, and show awareness of wearing schedule.   Status Achieved   OT LONG TERM GOAL #4   Title Patient will be independent with a home exercise / home activity program - 12/10/2015 (carried over for renewal)   Status On-going   OT LONG TERM GOAL #5   Title Patient will carry light weight item e.g. laundry basket, across length of room, i.e. 20 feet.     Status Achieved   OT LONG TERM GOAL #6   Title Patient will dress herself with modified independence   Status Achieved  G-Codes - 12/30/15 1221    Functional Assessment Tool Used clinical observations and progress towards short term goals.   Functional Limitation Carrying, moving and handling objects   Carrying, Moving and Handling Objects Current Status [phone]) At least 60 percent but less than 80 percent impaired, limited or restricted   Carrying, Moving and Handling Objects Goal Status (E6854) At least 60 percent but less than 80 percent impaired, limited or restricted      Problem List Patient Active Problem List   Diagnosis Date Noted  . CVA (cerebral vascular accident) ([place]) 12/27/2015  . Right hemiparesis ([place]) 12/27/2015  . Expressive aphasia 12/27/2015  . History of left breast cancer 12/27/2015  . H/O tubal ligation 12/27/2015  . Colon polyps 12/27/2015  . International Federation of Gynecology and Obstetrics (FIGO) stage IVB epithelial ovarian cancer ([place]) 12/27/2015  . Ovarian cancer, bilateral ([place] [facility]) 12/24/2015  . Inguinal adenopathy   . Carcinomatosis ([facility])  12/06/2015  . Essential hypertension, benign 07/18/2014  . Atrial fibrillation, unspecified 07/18/2014  . Anxiety state, unspecified 07/18/2014   OCCUPATIONAL THERAPY DISCHARGE SUMMARY  Visits from Start of Care: 19; Pt was diagnosed with ovarian cancer and asked to be placed on hold for therapies.  Pt has still not returned for OT and is still undergoing oncology treatment therefore will discharge from OT at this point. Pt is aware that she will need an MD order for eval and tx if she wishes to resume therapy.   Current functional level related to goals / functional outcomes: See above goal status   Remaining deficits: Spastic hemiplegia, decreased functional use of UE;  Please last note for detail of impairments.   Education / Equipment: HEP Plan: Patient agrees to discharge.  Patient goals were partially met. Patient is being discharged due to a change in medical status.  ?????      Quay Burow, OTR/L 12/30/2015, 12:22 PM  [facility] [address] [place], [place], [address] Phone: [phone]   Fax:  [phone]  Name: Brenda Cunningham MRN: [id number] Date of Birth: 19-Apr-1951

## 2015-12-31 ENCOUNTER — Telehealth: Payer: Self-pay

## 2015-12-31 ENCOUNTER — Encounter: Payer: Medicare Other | Admitting: Occupational Therapy

## 2015-12-31 ENCOUNTER — Encounter: Payer: Medicare Other | Admitting: Speech Pathology

## 2015-12-31 ENCOUNTER — Encounter: Payer: Self-pay | Admitting: Oncology

## 2015-12-31 DIAGNOSIS — C561 Malignant neoplasm of right ovary: Secondary | ICD-10-CM

## 2015-12-31 DIAGNOSIS — C563 Malignant neoplasm of bilateral ovaries: Secondary | ICD-10-CM

## 2015-12-31 DIAGNOSIS — C562 Malignant neoplasm of left ovary: Principal | ICD-10-CM

## 2015-12-31 MED ORDER — LIDOCAINE-PRILOCAINE 2.5-2.5 % EX CREA: TOPICAL_CREAM | CUTANEOUS | Status: DC

## 2015-12-31 NOTE — Progress Notes (Signed)
Per [employer] ondansetron was approved 12/06/15-12/06/16. Ref# [id number]. I sent to medical records

## 2015-12-31 NOTE — Telephone Encounter (Signed)
-----   Message from Ignacia Felling, RN sent at 12/27/2015 12:59 PM EST ----- Regarding: Dr. Edwyna Shell  chemo follow up call 1st taxol carbo  Patient phone [phone]

## 2015-12-31 NOTE — Telephone Encounter (Signed)
Spoke with daughter as mother's phone line was busy X 2. Daughter Kenney Houseman stated that her mother is doing well.  No N/V or aches.  She is eating will and drinking fluids well .Taking in ~64 oz daily.  Bowels moving well.

## 2015-12-31 NOTE — Telephone Encounter (Signed)
Daughter Kenney Houseman inquired about PAC placement as IV access was difficult with first treatment as noted below. Told her that a PAC placement was scheduled for 01-02-16 at 1000 prior to Dr. Mariana Kaufman visit at 1400.   Kenney Houseman agreed to bring her mother in tomorrow 01-01-16 @ 1030  for cbc and C-met to see if WBC ok for PAC procedure. Sent prescription in for EMLA cream and reviewed application with [name]. She verbalized understanding.   Patient Demographics     Patient Name Sex DOB SSN Address Phone    Marifer, Hurd Female 17-Nov-1951 [id number] [place] [address] [phone] (Home) *Preferred* [phone] (Mobile)       [facility]  Received: 4 days ago    Ignacia Felling, RN  Gordy Levan, MD Cc: Baruch Merl, RN           Consuello Masse was here for her first taxol/carbo treatment. She has very poor venous access. Right arm is limited due to previous stroke. Left arm was attempted three times, before a successful 4th attempt. Discussed PAC with patient and dtr., Tanya. After some discussion, patient has decided she would like a PAC in light of her 8 future planned chemotherapies. Thank you. Royce Macadamia

## 2016-01-01 ENCOUNTER — Other Ambulatory Visit (HOSPITAL_BASED_OUTPATIENT_CLINIC_OR_DEPARTMENT_OTHER): Payer: Medicare Other

## 2016-01-01 ENCOUNTER — Other Ambulatory Visit: Payer: Self-pay | Admitting: Oncology

## 2016-01-01 ENCOUNTER — Other Ambulatory Visit: Payer: Self-pay | Admitting: Radiology

## 2016-01-01 DIAGNOSIS — R1909 Other intra-abdominal and pelvic swelling, mass and lump: Secondary | ICD-10-CM

## 2016-01-01 DIAGNOSIS — C561 Malignant neoplasm of right ovary: Secondary | ICD-10-CM

## 2016-01-01 DIAGNOSIS — C800 Disseminated malignant neoplasm, unspecified: Secondary | ICD-10-CM

## 2016-01-01 DIAGNOSIS — Z01818 Encounter for other preprocedural examination: Secondary | ICD-10-CM

## 2016-01-01 DIAGNOSIS — C563 Malignant neoplasm of bilateral ovaries: Secondary | ICD-10-CM

## 2016-01-01 DIAGNOSIS — C562 Malignant neoplasm of left ovary: Principal | ICD-10-CM

## 2016-01-01 LAB — CBC WITH DIFFERENTIAL/PLATELET
BASO%: 0.5 % (ref 0.0–2.0)
Basophils Absolute: 0 10*3/uL (ref 0.0–0.1)
EOS%: 4.3 % (ref 0.0–7.0)
Eosinophils Absolute: 0.1 10*3/uL (ref 0.0–0.5)
HCT: 36.4 % (ref 34.8–46.6)
HGB: 11.8 g/dL (ref 11.6–15.9)
LYMPH%: 28.7 % (ref 14.0–49.7)
MCH: 30.7 pg (ref 25.1–34.0)
MCHC: 32.4 g/dL (ref 31.5–36.0)
MCV: 94.6 fL (ref 79.5–101.0)
MONO#: 0 10*3/uL — ABNORMAL LOW (ref 0.1–0.9)
MONO%: 0.6 % (ref 0.0–14.0)
NEUT#: 2.2 10*3/uL (ref 1.5–6.5)
NEUT%: 65.9 % (ref 38.4–76.8)
Platelets: 228 10*3/uL (ref 145–400)
RBC: 3.84 10*6/uL (ref 3.70–5.45)
RDW: 14.8 % — ABNORMAL HIGH (ref 11.2–14.5)
WBC: 3.3 10*3/uL — ABNORMAL LOW (ref 3.9–10.3)
lymph#: 0.9 10*3/uL (ref 0.9–3.3)

## 2016-01-01 LAB — COMPREHENSIVE METABOLIC PANEL
ALT: 28 U/L (ref 0–55)
AST: 25 U/L (ref 5–34)
Albumin: 3.3 g/dL — ABNORMAL LOW (ref 3.5–5.0)
Alkaline Phosphatase: 137 U/L (ref 40–150)
Anion Gap: 9 mEq/L (ref 3–11)
BUN: 12.2 mg/dL (ref 7.0–26.0)
CO2: 28 mEq/L (ref 22–29)
Calcium: 9.7 mg/dL (ref 8.4–10.4)
Chloride: 101 mEq/L (ref 98–109)
Creatinine: 0.8 mg/dL (ref 0.6–1.1)
EGFR: 89 mL/min/{1.73_m2} — ABNORMAL LOW (ref >90)
Glucose: 98 mg/dl (ref 70–140)
Potassium: 3.8 mEq/L (ref 3.5–5.1)
Sodium: 138 mEq/L (ref 136–145)
Total Bilirubin: 1.65 mg/dL — ABNORMAL HIGH (ref 0.20–1.20)
Total Protein: 8.2 g/dL (ref 6.4–8.3)

## 2016-01-02 ENCOUNTER — Other Ambulatory Visit: Payer: Medicare Other

## 2016-01-02 ENCOUNTER — Encounter: Payer: Self-pay | Admitting: Oncology

## 2016-01-02 ENCOUNTER — Encounter: Payer: Medicare Other | Admitting: Occupational Therapy

## 2016-01-02 ENCOUNTER — Ambulatory Visit (HOSPITAL_COMMUNITY): Payer: Medicare Other

## 2016-01-02 ENCOUNTER — Ambulatory Visit (HOSPITAL_BASED_OUTPATIENT_CLINIC_OR_DEPARTMENT_OTHER): Payer: Medicare Other | Admitting: Oncology

## 2016-01-02 ENCOUNTER — Other Ambulatory Visit (HOSPITAL_COMMUNITY): Payer: Medicare Other

## 2016-01-02 VITALS — BP 120/67 | HR 67 | Temp 98.1°F | Resp 18 | Ht 68.5 in | Wt 186.9 lb

## 2016-01-02 DIAGNOSIS — C561 Malignant neoplasm of right ovary: Secondary | ICD-10-CM

## 2016-01-02 DIAGNOSIS — C569 Malignant neoplasm of unspecified ovary: Secondary | ICD-10-CM

## 2016-01-02 DIAGNOSIS — C563 Malignant neoplasm of bilateral ovaries: Secondary | ICD-10-CM

## 2016-01-02 DIAGNOSIS — C801 Malignant (primary) neoplasm, unspecified: Secondary | ICD-10-CM | POA: Diagnosis not present

## 2016-01-02 DIAGNOSIS — I4891 Unspecified atrial fibrillation: Secondary | ICD-10-CM | POA: Diagnosis not present

## 2016-01-02 DIAGNOSIS — C774 Secondary and unspecified malignant neoplasm of inguinal and lower limb lymph nodes: Secondary | ICD-10-CM | POA: Diagnosis not present

## 2016-01-02 DIAGNOSIS — I878 Other specified disorders of veins: Secondary | ICD-10-CM

## 2016-01-02 DIAGNOSIS — Z883 Allergy status to other anti-infective agents status: Secondary | ICD-10-CM | POA: Diagnosis not present

## 2016-01-02 DIAGNOSIS — C562 Malignant neoplasm of left ovary: Secondary | ICD-10-CM

## 2016-01-02 DIAGNOSIS — I69351 Hemiplegia and hemiparesis following cerebral infarction affecting right dominant side: Secondary | ICD-10-CM

## 2016-01-02 DIAGNOSIS — Z853 Personal history of malignant neoplasm of breast: Secondary | ICD-10-CM

## 2016-01-02 LAB — CA 125: Cancer Antigen (CA) 125: 741.6 U/mL — ABNORMAL HIGH (ref 0.0–38.1)

## 2016-01-02 MED ORDER — DEXAMETHASONE 4 MG PO TABS: ORAL_TABLET | ORAL | Status: DC

## 2016-01-02 NOTE — Progress Notes (Signed)
OFFICE PROGRESS NOTE   January 02, 2016   Physicians:Emma Benedetto Coons (PCP), Lindell Spar [phone] neurology [place]), Dr Valetta Fuller (cardiology [place], [phone], new patient apt 01-15-16)   Carol Ada (Alapati P. Marcello Moores, PCP [place], [place] [phone]) ( (oncologist Dr Maudie Mercury in [place], [place]; breast radiation given at [facility] in [place]).  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   Peripheral IV access for first chemotherapy was very difficult, such that she will need either PAC or PICC to continue treatment. Situation is complicated by anticoagulation with Eliquis (+ ASA) since CVA 07-2015 in setting of A fib (+prior TIAs). Family has brought names and contact information for other physicians now. As we will need to hold Eliquis for PAC,  I have LM for neurologist now, however cannot discuss with cardiologist as she has not yet had new patient visit with him.   Other than IV access, patient had no significant problems with start of chemo. She has had no nausea or vomiting and does not seem to be complaining of as much abdominal discomfort now. She has been eating and drinking fluids, tho weight is down. Bowels are moving and she is voiding. She denies bleeding. She has no LE swelling. Speech difficulty and right hemiparesis unchanged, no new neurologic deficits. Left anetcubital used for labs today Remainder of 10 point Review of Systems negative.   No central catheter Flu vaccine 10-10-15 No genetics testing known   ONCOLOGIC HISTORY  Patient presented to ED in [place] 11-28-15 with LLQ pain and left hip pain, symptomatic for at least a year per family but worse. CT AP 11-28-15 had bilateral ovarian masses up to 4 cm on right,  with peritoneal carcinomatosis in pelvis, omentum and perihepatic, with small ascites, bilateral pelvic and inguinal adenopathy, with retroperitoneal adenopathy extending to pelvic brim. 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 Recommendation was to begin treatment with carboplatin taxol, with reevaluation after 3 cycles for possible interval debulking surgery.   She has history of left breast cancer 2005 treated in [place] with lumpectomy, chemotherapy and radiation, none of those records presently available.   Objective:  Vital signs in last 24 hours:  BP 120/67 mmHg  Pulse 67  Temp(Src) 98.1 F (36.7 C) (Oral)  Resp 18  Ht 5' 8.5" (1.74 m)  Wt 186 lb 14.4 oz (84.777 kg)  BMI 28.00 kg/m2  SpO2 100% Weight is down 5 lbs Alert, cooperative, expressive speech disturbance. Ambulatory without assistance, right hemiparesis. Looks comfortable, respirations not labored RA, seems in good spirits.  HEENT:PERRL, sclerae not icteric. Oral mucosa moist without lesions, posterior pharynx clear.  Neck supple. No JVD.  Lymphatics:no cervical,supraclavicular adenopathy Resp: clear to auscultation bilaterally and normal percussion bilaterally Cardio: regular rate and rhythm. No gallop. GI: soft, nontender, not obviously distended, no organomegaly. Some bowel sounds.  Musculoskeletal/ Extremities: without pitting edema, cords, tenderness Neuro: speech as noted, RUE not functional, RLE weak but ambulates as noted.  Skin without rash, ecchymosis, petechiae. Some bruising LUE from sites of attempted IV  access, no cellulitis or cords.    Lab Results:  Results for orders placed or performed in visit on 01/01/16   CBC with Differential  Result Value Ref Range   WBC 3.3 (L) 3.9 - 10.3 10e3/uL   NEUT# 2.2 1.5 - 6.5 10e3/uL   HGB 11.8 11.6 - 15.9 g/dL   HCT 36.4 34.8 - 46.6 %   Platelets 228 145 - 400 10e3/uL   MCV 94.6 79.5 - 101.0 fL   MCH 30.7 25.1 - 34.0 pg   MCHC 32.4 31.5 - 36.0 g/dL   RBC 3.84 3.70 - 5.45 10e6/uL   RDW 14.8 (H) 11.2 - 14.5 %   lymph# 0.9 0.9 - 3.3 10e3/uL   MONO# 0.0 (L) 0.1 - 0.9 10e3/uL   Eosinophils Absolute 0.1 0.0 - 0.5 10e3/uL   Basophils Absolute 0.0 0.0 - 0.1 10e3/uL   NEUT% 65.9 38.4 - 76.8 %   LYMPH% 28.7 14.0 - 49.7 %   MONO% 0.6 0.0 - 14.0 %   EOS% 4.3 0.0 - 7.0 %   BASO% 0.5 0.0 - 2.0 %  Comprehensive metabolic panel  Result Value Ref Range   Sodium 138 136 - 145 mEq/L   Potassium 3.8 3.5 - 5.1 mEq/L   Chloride 101 98 - 109 mEq/L   CO2 28 22 - 29 mEq/L   Glucose 98 70 - 140 mg/dl   BUN 12.2 7.0 - 26.0 mg/dL   Creatinine 0.8 0.6 - 1.1 mg/dL   Total Bilirubin 1.65 (H) 0.20 - 1.20 mg/dL   Alkaline Phosphatase 137 40 - 150 U/L   AST 25 5 - 34 U/L   ALT 28 0 - 55 U/L   Total Protein 8.2 6.4 - 8.3 g/dL   Albumin 3.3 (L) 3.5 - 5.0 g/dL   Calcium 9.7 8.4 - 10.4 mg/dL   Anion Gap 9 3 - 11 mEq/L   EGFR 89 (L) >90 ml/min/1.73 m2  CA 125  Result Value Ref Range   Cancer Antigen (CA) 125 741.6 (H) 0.0 - 38.1 U/mL     Studies/Results:  No results found.  Medications: I have reviewed the patient's current medications.  DISCUSSION Daughter and patient aware that we need to have IV access in order to continue chemo, either PAC or possibly PICC. Standard recommendation per IR staff is to hold Eliquis x 48 hrs for PAC, ok to continue ASA. I have LM for her neurologist now. I have spoken with [facility] cardiology staff/ not physician, however still waiting to establish at that office.  I have spoken with Dr Einar Gip, who saw her during hospitalization with CVA and kindly discussed the Eliquis with me: consideration of holding Eliquis bid dosing for day prior to  [facility] and AM of PAC, then if stable from [facility] placement would resume night of PAC.   Will need to discuss hold time for Eliquis if PICC, which I would do in preference to [facility] if easier to accomplish with anticoagulation now and if family can bring to office for flushes.  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 Patient was mildly symptomatic with LLQ pain at presentation, may be some better. Chemotherapy begun with dose dense carbo taxol day 1 cycle 1 on 12-27-15, tolerated well other than IV access. Will reschedule chemo after central line placement. 2. 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 Hopefully will be able to  resume outpatient PT/ OT/ speech therapy in near future. Followed by neurology in [facility].  3.history of TIAs 2010 4.atrial fibrillation diagnosed ~ 2011, previously on xeloda and now Eliquis + ASA. CHADS2 score I believe 3. Has new patient appointment with [facility] 01-15-16.  5.Left breast cancer 2005 diagnosed and treated in [place] with lumpectomy and presumably axillary node evaluation, combination chemotherapy (drugs not known) via peripheral veins, local radiation. Up to date on mammograms, done 10-18-15 at [facility]. We will try to get records from prior PCP in [place], as family not able to provide names or contacts of oncologists. 6.hx HTN 7.colon polyps at colonoscopy by Dr Benson Norway 2015. Follow up not specified on the report in EMR 8.post appendectomy, BTL 9.family history of cardiac disease with MIs in sisters ages 57 and 22, and in brother 10.flu vaccine done 10-2015   All questions answered and daughter understands that we will contact them with recommendations for the central line and anticoagulation. Time spent 40 min including >50% counseling and coordination of care. Cc Drs, Higinio Roger,  Lindell Spar, John Powers    Gordy Levan, MD   01/02/2016, 9:43 PM

## 2016-01-03 ENCOUNTER — Ambulatory Visit: Payer: Medicare Other

## 2016-01-03 ENCOUNTER — Other Ambulatory Visit: Payer: Medicare Other

## 2016-01-06 ENCOUNTER — Other Ambulatory Visit: Payer: Self-pay | Admitting: Oncology

## 2016-01-06 ENCOUNTER — Telehealth: Payer: Self-pay | Admitting: Oncology

## 2016-01-06 DIAGNOSIS — C563 Malignant neoplasm of bilateral ovaries: Secondary | ICD-10-CM

## 2016-01-06 DIAGNOSIS — C561 Malignant neoplasm of right ovary: Secondary | ICD-10-CM

## 2016-01-06 DIAGNOSIS — C562 Malignant neoplasm of left ovary: Principal | ICD-10-CM

## 2016-01-06 DIAGNOSIS — C800 Disseminated malignant neoplasm, unspecified: Secondary | ICD-10-CM

## 2016-01-06 NOTE — Telephone Encounter (Signed)
MEDICAL ONCOLOGY  Spoke with IR now: standard IR protocol is to hold Eliquis x 48 hrs for PAC, however would not need to hold Eliquis for PICC. Spoke at length with daughter by phone now. As new patient visit with cardiologist is not until Feb 8, and with concerns about holding Eliquis as well as concerns about delay of chemo, PICC seems best option at present. We have discussed placement in general, flushing 2x weekly (one flush will be with weekly chemo), and risk of infection with PICC as compared with PAC. I have told daughter that, after cardiologist meets her and gives recommendations, she may be able to have PICC changed for [facility] later. Daughter prefers to keep chemo on Fridays. I have requested PICC flushes on Mondays to allow coordination of MD visits. Will try to get PICC placed by IR to allow chemo (will be day 8 cycle 1) on 01-10-16.  POF and orders placed.  Godfrey Pick, MD

## 2016-01-07 ENCOUNTER — Telehealth: Payer: Self-pay | Admitting: Oncology

## 2016-01-07 NOTE — Telephone Encounter (Signed)
Left a message per louise to have her come in in 2/2 for lab only instead of 2/3

## 2016-01-07 NOTE — Telephone Encounter (Signed)
Appointments added per pof and patient will get a new avs at 2/3 appointment

## 2016-01-09 ENCOUNTER — Telehealth: Payer: Self-pay | Admitting: Oncology

## 2016-01-09 ENCOUNTER — Other Ambulatory Visit: Payer: Self-pay

## 2016-01-09 ENCOUNTER — Telehealth: Payer: Self-pay

## 2016-01-09 ENCOUNTER — Other Ambulatory Visit (HOSPITAL_BASED_OUTPATIENT_CLINIC_OR_DEPARTMENT_OTHER): Payer: Medicare Other

## 2016-01-09 ENCOUNTER — Other Ambulatory Visit: Payer: Self-pay | Admitting: Oncology

## 2016-01-09 ENCOUNTER — Ambulatory Visit (HOSPITAL_BASED_OUTPATIENT_CLINIC_OR_DEPARTMENT_OTHER): Payer: Medicare Other

## 2016-01-09 VITALS — BP 135/78 | HR 67 | Temp 98.2°F

## 2016-01-09 DIAGNOSIS — C563 Malignant neoplasm of bilateral ovaries: Secondary | ICD-10-CM

## 2016-01-09 DIAGNOSIS — C562 Malignant neoplasm of left ovary: Secondary | ICD-10-CM

## 2016-01-09 DIAGNOSIS — D701 Agranulocytosis secondary to cancer chemotherapy: Secondary | ICD-10-CM

## 2016-01-09 DIAGNOSIS — C561 Malignant neoplasm of right ovary: Secondary | ICD-10-CM | POA: Diagnosis present

## 2016-01-09 DIAGNOSIS — Z5189 Encounter for other specified aftercare: Secondary | ICD-10-CM

## 2016-01-09 DIAGNOSIS — T451X5A Adverse effect of antineoplastic and immunosuppressive drugs, initial encounter: Secondary | ICD-10-CM

## 2016-01-09 DIAGNOSIS — C800 Disseminated malignant neoplasm, unspecified: Secondary | ICD-10-CM

## 2016-01-09 DIAGNOSIS — D702 Other drug-induced agranulocytosis: Secondary | ICD-10-CM

## 2016-01-09 LAB — CBC WITH DIFFERENTIAL/PLATELET
BASO%: 0.9 % (ref 0.0–2.0)
Basophils Absolute: 0 10*3/uL (ref 0.0–0.1)
EOS%: 2.8 % (ref 0.0–7.0)
Eosinophils Absolute: 0.1 10*3/uL (ref 0.0–0.5)
HCT: 31.5 % — ABNORMAL LOW (ref 34.8–46.6)
HGB: 10.5 g/dL — ABNORMAL LOW (ref 11.6–15.9)
LYMPH%: 61.7 % — ABNORMAL HIGH (ref 14.0–49.7)
MCH: 31.4 pg (ref 25.1–34.0)
MCHC: 33.2 g/dL (ref 31.5–36.0)
MCV: 94.4 fL (ref 79.5–101.0)
MONO#: 0.5 10*3/uL (ref 0.1–0.9)
MONO%: 27.5 % — ABNORMAL HIGH (ref 0.0–14.0)
NEUT#: 0.1 10*3/uL — CL (ref 1.5–6.5)
NEUT%: 7.1 % — ABNORMAL LOW (ref 38.4–76.8)
Platelets: 182 10*3/uL (ref 145–400)
RBC: 3.33 10*6/uL — ABNORMAL LOW (ref 3.70–5.45)
RDW: 14.7 % — ABNORMAL HIGH (ref 11.2–14.5)
WBC: 1.9 10*3/uL — ABNORMAL LOW (ref 3.9–10.3)
lymph#: 1.2 10*3/uL (ref 0.9–3.3)

## 2016-01-09 LAB — COMPREHENSIVE METABOLIC PANEL
ALT: 42 U/L (ref 0–55)
AST: 31 U/L (ref 5–34)
Albumin: 3.2 g/dL — ABNORMAL LOW (ref 3.5–5.0)
Alkaline Phosphatase: 161 U/L — ABNORMAL HIGH (ref 40–150)
Anion Gap: 10 mEq/L (ref 3–11)
BUN: 9.3 mg/dL (ref 7.0–26.0)
CO2: 25 mEq/L (ref 22–29)
Calcium: 9.3 mg/dL (ref 8.4–10.4)
Chloride: 106 mEq/L (ref 98–109)
Creatinine: 0.8 mg/dL (ref 0.6–1.1)
EGFR: >90 mL/min/{1.73_m2} (ref >90)
Glucose: 101 mg/dl (ref 70–140)
Potassium: 3.5 mEq/L (ref 3.5–5.1)
Sodium: 141 mEq/L (ref 136–145)
Total Bilirubin: 0.84 mg/dL (ref 0.20–1.20)
Total Protein: 7.8 g/dL (ref 6.4–8.3)

## 2016-01-09 MED ORDER — CIPROFLOXACIN HCL 250 MG PO TABS: 250.0000 mg | ORAL_TABLET | Freq: Two times a day (BID) | ORAL | Status: DC

## 2016-01-09 MED ORDER — TBO-FILGRASTIM 480 MCG/0.8ML ~~LOC~~ SOSY
480.0000 ug | PREFILLED_SYRINGE | Freq: Once | SUBCUTANEOUS | Status: AC
  Administered 2016-01-09: 480 ug via SUBCUTANEOUS
  Filled 2016-01-09: qty 0.8

## 2016-01-09 NOTE — Patient Instructions (Signed)
Neutropenia Neutropenia is a condition that occurs when the level of a certain type of white blood cell (neutrophil) in your body becomes lower than normal. Neutrophils are made in the bone marrow and fight infections. These cells protect against bacteria and viruses. The fewer neutrophils you have, and the longer your body remains without them, the greater your risk of getting a severe infection becomes. CAUSES  The cause of neutropenia may be hard to determine. However, it is usually due to 3 main problems:   Decreased production of neutrophils. This may be due to:  Certain medicines such as chemotherapy.  Genetic problems.  Cancer.  Radiation treatments.  Vitamin deficiency.  Some pesticides.  Increased destruction of neutrophils. This may be due to:  Overwhelming infections.  Hemolytic anemia. This is when the body destroys its own blood cells.  Chemotherapy.  Neutrophils moving to areas of the body where they cannot fight infections. This may be due to:  Dialysis procedures.  Conditions where the spleen becomes enlarged. Neutrophils are held in the spleen and are not available to the rest of the body.  Overwhelming infections. The neutrophils are held in the area of the infection and are not available to the rest of the body. SYMPTOMS  There are no specific symptoms of neutropenia. The lack of neutrophils can result in an infection, and an infection can cause various problems. DIAGNOSIS  Diagnosis is made by a blood test. A complete blood count is performed. The normal level of neutrophils in human blood differs with age and race. Infants have lower counts than older children and adults. African Americans have lower counts than Caucasians or Asians. The average adult level is 1500 cells/mm3 of blood. Neutrophil counts are interpreted as follows:  Greater than 1000 cells/mm3 gives normal protection against infection.  500 to 1000 cells/mm3 gives an increased risk for  infection.  200 to 500 cells/mm3 is a greater risk for severe infection.  Lower than 200 cells/mm3 is a marked risk of infection. This may require hospitalization and treatment with antibiotic medicines. TREATMENT  Treatment depends on the underlying cause, severity, and presence of infections or symptoms. It also depends on your health. Your caregiver will discuss the treatment plan with you. Mild cases are often easily treated and have a good outcome. Preventative measures may also be started to limit your risk of infections. Treatment can include:  Taking antibiotics.  Stopping medicines that are known to cause neutropenia.  Correcting nutritional deficiencies by eating green vegetables to supply folic acid and taking vitamin B supplements.  Stopping exposure to pesticides if your neutropenia is related to pesticide exposure.  Taking a blood growth factor called sargramostim, pegfilgrastim, or filgrastim if you are undergoing chemotherapy for cancer. This stimulates white blood cell production.  Removal of the spleen if you have Felty's syndrome and have repeated infections. HOME CARE INSTRUCTIONS   Follow your caregiver's instructions about when you need to have blood work done.  Wash your hands often. Make sure others who come in contact with you also wash their hands.  Wash raw fruits and vegetables before eating them. They can carry bacteria and fungi.  Avoid people with colds or spreadable (contagious) diseases (chickenpox, herpes zoster, influenza).  Avoid large crowds.  Avoid construction areas. The dust can release fungus into the air.  Be cautious around children in daycare or school environments.  Take care of your respiratory system by coughing and deep breathing.  Bathe daily.  Protect your skin from cuts and   burns.  Do not work in the garden or with flowers and plants.  Care for the mouth before and after meals by brushing with a soft toothbrush. If you have  mucositis, do not use mouthwash. Mouthwash contains alcohol and can dry out the mouth even more.  Clean the area between the genitals and the anus (perineal area) after urination and bowel movements. Women need to wipe from front to back.  Use a water soluble lubricant during sexual intercourse and practice good hygiene after. Do not have intercourse if you are severely neutropenic. Check with your caregiver for guidelines.  Exercise daily as tolerated.  Avoid people who were vaccinated with a live vaccine in the past 30 days. You should not receive live vaccines (polio, typhoid).  Do not provide direct care for pets. Avoid animal droppings. Do not clean litter boxes and bird cages.  Do not share food utensils.  Do not use tampons, enemas, or rectal suppositories unless directed by your caregiver.  Use an electric razor to remove hair.  Wash your hands after handling magazines, letters, and newspapers. SEEK IMMEDIATE MEDICAL CARE IF:   You have a fever.  You have chills or start to shake.  You feel nauseous or vomit.  You develop mouth sores.  You develop aches and pains.  You have redness and swelling around open wounds.  Your skin is warm to the touch.  You have pus coming from your wounds.  You develop swollen lymph nodes.  You feel weak or fatigued.  You develop red streaks on the skin. MAKE SURE YOU:  Understand these instructions.  Will watch your condition.  Will get help right away if you are not doing well or get worse.   This information is not intended to replace advice given to you by your health care provider. Make sure you discuss any questions you have with your health care provider.   Document Released: 05/15/2002 Document Revised: 02/15/2012 Document Reviewed: 06/05/2015 Elsevier Interactive Patient Education 2016 Elsevier Inc. Tbo-Filgrastim injection What is this medicine? TBO-FILGRASTIM (T B O fil GRA stim) is a granulocyte  colony-stimulating factor that stimulates the growth of neutrophils, a type of white blood cell important in the body's fight against infection. It is used to reduce the incidence of fever and infection in patients with certain types of cancer who are receiving chemotherapy that affects the bone marrow. This medicine may be used for other purposes; ask your health care provider or pharmacist if you have questions. What should I tell my health care provider before I take this medicine? They need to know if you have any of these conditions: -ongoing radiation therapy -sickle cell anemia -an unusual or allergic reaction to tbo-filgrastim, filgrastim, pegfilgrastim, other medicines, foods, dyes, or preservatives -pregnant or trying to get pregnant -breast-feeding How should I use this medicine? This medicine is for injection under the skin. If you get this medicine at home, you will be taught how to prepare and give this medicine. Refer to the Instructions for Use that come with your medication packaging. Use exactly as directed. Take your medicine at regular intervals. Do not take your medicine more often than directed. It is important that you put your used needles and syringes in a special sharps container. Do not put them in a trash can. If you do not have a sharps container, call your pharmacist or healthcare provider to get one. Talk to your pediatrician regarding the use of this medicine in children. Special care may be   needed. Overdosage: If you think you have taken too much of this medicine contact a poison control center or emergency room at once. NOTE: This medicine is only for you. Do not share this medicine with others. What if I miss a dose? It is important not to miss your dose. Call your doctor or health care professional if you miss a dose. What may interact with this medicine? This medicine may interact with the following medications: -medicines that may cause a release of  neutrophils, such as lithium This list may not describe all possible interactions. Give your health care provider a list of all the medicines, herbs, non-prescription drugs, or dietary supplements you use. Also tell them if you smoke, drink alcohol, or use illegal drugs. Some items may interact with your medicine. What should I watch for while using this medicine? You may need blood work done while you are taking this medicine. What side effects may I notice from receiving this medicine? Side effects that you should report to your doctor or health care professional as soon as possible: -allergic reactions like skin rash, itching or hives, swelling of the face, lips, or tongue -shortness of breath or breathing problems -fever -pain, redness, or irritation at site where injected -pinpoint red spots on the skin -stomach or side pain, or pain at the shoulder -swelling -tiredness -trouble passing urine Side effects that usually do not require medical attention (Report these to your doctor or health care professional if they continue or are bothersome.): -bone pain -muscle pain This list may not describe all possible side effects. Call your doctor for medical advice about side effects. You may report side effects to FDA at 1-800-FDA-1088. Where should I keep my medicine? Keep out of the reach of children. Store in a refrigerator between 2 and 8 degrees C (36 and 46 degrees F). Keep in carton to protect from light. Throw away this medicine if it is left out of the refrigerator for more than 5 consecutive days. Throw away any unused medicine after the expiration date. NOTE: This sheet is a summary. It may not cover all possible information. If you have questions about this medicine, talk to your doctor, pharmacist, or health care provider.    2016, Elsevier/Gold Standard. (2014-03-15 11:52:29)  

## 2016-01-09 NOTE — Telephone Encounter (Signed)
perp of to sch pt appt-pt aware °

## 2016-01-09 NOTE — Progress Notes (Signed)
Neutropenic precautions discussed and states understanding.  Gave patient masks to use and about good hand washing.  Talked about taking antibiotic and future injection and labs as ordered.

## 2016-01-09 NOTE — Telephone Encounter (Signed)
lvm with daughter that pt needs to return to [facility] today and tomorrow and Saturday for a shot and Monday for CBC and possibly another shot. For neutropenia. Daughter called back and is returning for granix shot.  Called IR to cancel PICC for 2/3.

## 2016-01-10 ENCOUNTER — Other Ambulatory Visit (HOSPITAL_COMMUNITY): Payer: Medicare Other

## 2016-01-10 ENCOUNTER — Ambulatory Visit (HOSPITAL_BASED_OUTPATIENT_CLINIC_OR_DEPARTMENT_OTHER): Payer: Medicare Other

## 2016-01-10 ENCOUNTER — Other Ambulatory Visit: Payer: Medicare Other

## 2016-01-10 ENCOUNTER — Ambulatory Visit: Payer: Medicare Other

## 2016-01-10 VITALS — BP 120/70 | HR 77 | Temp 98.5°F

## 2016-01-10 DIAGNOSIS — C561 Malignant neoplasm of right ovary: Secondary | ICD-10-CM | POA: Diagnosis present

## 2016-01-10 DIAGNOSIS — C562 Malignant neoplasm of left ovary: Secondary | ICD-10-CM

## 2016-01-10 DIAGNOSIS — Z5189 Encounter for other specified aftercare: Secondary | ICD-10-CM | POA: Diagnosis not present

## 2016-01-10 DIAGNOSIS — C563 Malignant neoplasm of bilateral ovaries: Secondary | ICD-10-CM

## 2016-01-10 MED ORDER — TBO-FILGRASTIM 480 MCG/0.8ML ~~LOC~~ SOSY
480.0000 ug | PREFILLED_SYRINGE | Freq: Once | SUBCUTANEOUS | Status: AC
  Administered 2016-01-10: 480 ug via SUBCUTANEOUS
  Filled 2016-01-10: qty 0.8

## 2016-01-11 ENCOUNTER — Ambulatory Visit (HOSPITAL_BASED_OUTPATIENT_CLINIC_OR_DEPARTMENT_OTHER): Payer: Medicare Other

## 2016-01-11 VITALS — BP 119/63 | HR 87 | Temp 97.8°F | Resp 18

## 2016-01-11 DIAGNOSIS — Z5189 Encounter for other specified aftercare: Secondary | ICD-10-CM | POA: Diagnosis not present

## 2016-01-11 DIAGNOSIS — C801 Malignant (primary) neoplasm, unspecified: Secondary | ICD-10-CM | POA: Diagnosis present

## 2016-01-11 DIAGNOSIS — C563 Malignant neoplasm of bilateral ovaries: Secondary | ICD-10-CM

## 2016-01-11 DIAGNOSIS — C561 Malignant neoplasm of right ovary: Secondary | ICD-10-CM

## 2016-01-11 DIAGNOSIS — C562 Malignant neoplasm of left ovary: Principal | ICD-10-CM

## 2016-01-11 MED ORDER — TBO-FILGRASTIM 480 MCG/0.8ML ~~LOC~~ SOSY
480.0000 ug | PREFILLED_SYRINGE | Freq: Once | SUBCUTANEOUS | Status: AC
  Administered 2016-01-11: 480 ug via SUBCUTANEOUS
  Filled 2016-01-11: qty 0.8

## 2016-01-11 NOTE — Patient Instructions (Signed)

## 2016-01-13 ENCOUNTER — Ambulatory Visit: Payer: Medicare Other

## 2016-01-13 ENCOUNTER — Other Ambulatory Visit (HOSPITAL_BASED_OUTPATIENT_CLINIC_OR_DEPARTMENT_OTHER): Payer: Medicare Other

## 2016-01-13 DIAGNOSIS — D701 Agranulocytosis secondary to cancer chemotherapy: Secondary | ICD-10-CM

## 2016-01-13 DIAGNOSIS — T451X5A Adverse effect of antineoplastic and immunosuppressive drugs, initial encounter: Secondary | ICD-10-CM

## 2016-01-13 LAB — CBC WITH DIFFERENTIAL/PLATELET
BASO%: 0.2 % (ref 0.0–2.0)
Basophils Absolute: 0 10*3/uL (ref 0.0–0.1)
EOS%: 0.3 % (ref 0.0–7.0)
Eosinophils Absolute: 0.1 10*3/uL (ref 0.0–0.5)
HCT: 32.5 % — ABNORMAL LOW (ref 34.8–46.6)
HGB: 10.8 g/dL — ABNORMAL LOW (ref 11.6–15.9)
LYMPH%: 9.6 % — ABNORMAL LOW (ref 14.0–49.7)
MCH: 31.1 pg (ref 25.1–34.0)
MCHC: 33.2 g/dL (ref 31.5–36.0)
MCV: 93.7 fL (ref 79.5–101.0)
MONO#: 2 10*3/uL — ABNORMAL HIGH (ref 0.1–0.9)
MONO%: 10.1 % (ref 0.0–14.0)
NEUT#: 15.5 10*3/uL — ABNORMAL HIGH (ref 1.5–6.5)
NEUT%: 79.8 % — ABNORMAL HIGH (ref 38.4–76.8)
Platelets: 223 10*3/uL (ref 145–400)
RBC: 3.47 10*6/uL — ABNORMAL LOW (ref 3.70–5.45)
RDW: 16 % — ABNORMAL HIGH (ref 11.2–14.5)
WBC: 19.4 10*3/uL — ABNORMAL HIGH (ref 3.9–10.3)
lymph#: 1.9 10*3/uL (ref 0.9–3.3)
nRBC: 0 % (ref 0–0)

## 2016-01-13 NOTE — Progress Notes (Signed)
ANC up to 15.5.  No Granix injection today.  will get rescheduled for PICC line this week

## 2016-01-14 ENCOUNTER — Telehealth: Payer: Self-pay | Admitting: *Deleted

## 2016-01-14 NOTE — Telephone Encounter (Signed)
Spoke with Daughter Brenda Cunningham and told her that there is no availability to place [facility] Friday 01-17-16.  PICC line can be placed at 1000 on 01-17-16.  Ms. Carra would need to arrive at [facility] radiology at 0930. [facility] IR will draw the cbc/diff and c-met from PICC line once it is placed and bring tubes to [facility]. Ms. Silverio will come over from IR for treatment at 1100.  PICC line dressing change will need to be scheduled for Sat 01-18-16 per protocol. Daughter would like the picc line to remain in until [facility] placed. Will know after cardiology appointment 01-15-16 if patient can hold the Eloquis for 48 hrs to have PAC placed. Brenda Cunningham understood information above and is in agreement with plan.

## 2016-01-14 NOTE — Telephone Encounter (Signed)
Patient's daughter called in reference to vascular access.  "She prefers port-a-cath over P.I.C.C. Line.  Meets new cardiologist with [facility] tomorrow.  Hoping when he meets patient he will authorize stopping blood thinners for Port-a-cath to be placed.  P.I.C.C. Scheduled for placement on Friday 01-17-2016 before scheduled Taxol.  Advised they have provider communicate orders with this office as soon as possible for request to be carried out.  "We do not want her to have PICC placed to be removed for a port-a-cath."

## 2016-01-15 DIAGNOSIS — Z01818 Encounter for other preprocedural examination: Secondary | ICD-10-CM | POA: Diagnosis not present

## 2016-01-15 DIAGNOSIS — I10 Essential (primary) hypertension: Secondary | ICD-10-CM | POA: Diagnosis not present

## 2016-01-15 DIAGNOSIS — E785 Hyperlipidemia, unspecified: Secondary | ICD-10-CM | POA: Diagnosis not present

## 2016-01-15 DIAGNOSIS — Z8679 Personal history of other diseases of the circulatory system: Secondary | ICD-10-CM | POA: Diagnosis not present

## 2016-01-15 DIAGNOSIS — I482 Chronic atrial fibrillation: Secondary | ICD-10-CM | POA: Diagnosis not present

## 2016-01-15 NOTE — Telephone Encounter (Signed)
Patient's daughter Kenney Houseman called reporting "Cardiologist has authorized Ms. Charton may come off the Eloquis for one day for port-a-cath to be placed."

## 2016-01-16 ENCOUNTER — Other Ambulatory Visit: Payer: Self-pay | Admitting: Oncology

## 2016-01-17 ENCOUNTER — Ambulatory Visit (HOSPITAL_COMMUNITY)
Admission: RE | Admit: 2016-01-17 | Discharge: 2016-01-17 | Disposition: A | Discharge status: Home / Self Care | Payer: Medicare Other | Source: Ambulatory Visit | Attending: Oncology | Admitting: Oncology

## 2016-01-17 ENCOUNTER — Other Ambulatory Visit: Payer: Self-pay | Admitting: Oncology

## 2016-01-17 ENCOUNTER — Encounter: Payer: Self-pay | Admitting: Oncology

## 2016-01-17 ENCOUNTER — Ambulatory Visit (HOSPITAL_BASED_OUTPATIENT_CLINIC_OR_DEPARTMENT_OTHER): Payer: Medicare Other

## 2016-01-17 ENCOUNTER — Other Ambulatory Visit (HOSPITAL_BASED_OUTPATIENT_CLINIC_OR_DEPARTMENT_OTHER): Payer: Medicare Other

## 2016-01-17 VITALS — BP 123/67 | HR 74 | Temp 98.5°F | Resp 18

## 2016-01-17 DIAGNOSIS — Z5111 Encounter for antineoplastic chemotherapy: Secondary | ICD-10-CM

## 2016-01-17 DIAGNOSIS — C562 Malignant neoplasm of left ovary: Secondary | ICD-10-CM | POA: Diagnosis not present

## 2016-01-17 DIAGNOSIS — C800 Disseminated malignant neoplasm, unspecified: Secondary | ICD-10-CM

## 2016-01-17 DIAGNOSIS — C561 Malignant neoplasm of right ovary: Secondary | ICD-10-CM

## 2016-01-17 DIAGNOSIS — C563 Malignant neoplasm of bilateral ovaries: Secondary | ICD-10-CM

## 2016-01-17 LAB — CBC WITH DIFFERENTIAL/PLATELET
BASO%: 0 % (ref 0.0–2.0)
Basophils Absolute: 0 10*3/uL (ref 0.0–0.1)
EOS%: 0 % (ref 0.0–7.0)
Eosinophils Absolute: 0 10*3/uL (ref 0.0–0.5)
HCT: 30.7 % — ABNORMAL LOW (ref 34.8–46.6)
HGB: 10.4 g/dL — ABNORMAL LOW (ref 11.6–15.9)
LYMPH%: 12.5 % — ABNORMAL LOW (ref 14.0–49.7)
MCH: 31.1 pg (ref 25.1–34.0)
MCHC: 33.9 g/dL (ref 31.5–36.0)
MCV: 91.9 fL (ref 79.5–101.0)
MONO#: 0 10*3/uL — ABNORMAL LOW (ref 0.1–0.9)
MONO%: 0.5 % (ref 0.0–14.0)
NEUT#: 7 10*3/uL — ABNORMAL HIGH (ref 1.5–6.5)
NEUT%: 87 % — ABNORMAL HIGH (ref 38.4–76.8)
Platelets: 241 10*3/uL (ref 145–400)
RBC: 3.34 10*6/uL — ABNORMAL LOW (ref 3.70–5.45)
RDW: 16 % — ABNORMAL HIGH (ref 11.2–14.5)
WBC: 8 10*3/uL (ref 3.9–10.3)
lymph#: 1 10*3/uL (ref 0.9–3.3)
nRBC: 0 % (ref 0–0)

## 2016-01-17 LAB — COMPREHENSIVE METABOLIC PANEL
ALT: 22 U/L (ref 0–55)
AST: 17 U/L (ref 5–34)
Albumin: 3.2 g/dL — ABNORMAL LOW (ref 3.5–5.0)
Alkaline Phosphatase: 175 U/L — ABNORMAL HIGH (ref 40–150)
Anion Gap: 12 mEq/L — ABNORMAL HIGH (ref 3–11)
BUN: 11.2 mg/dL (ref 7.0–26.0)
CO2: 21 mEq/L — ABNORMAL LOW (ref 22–29)
Calcium: 9.8 mg/dL (ref 8.4–10.4)
Chloride: 104 mEq/L (ref 98–109)
Creatinine: 0.8 mg/dL (ref 0.6–1.1)
EGFR: >90 mL/min/{1.73_m2} (ref >90)
Glucose: 179 mg/dl — ABNORMAL HIGH (ref 70–140)
Potassium: 3.9 mEq/L (ref 3.5–5.1)
Sodium: 137 mEq/L (ref 136–145)
Total Bilirubin: 0.74 mg/dL (ref 0.20–1.20)
Total Protein: 7.9 g/dL (ref 6.4–8.3)

## 2016-01-17 MED ORDER — LIDOCAINE HCL 1 % IJ SOLN
INTRAMUSCULAR | Status: DC | PRN
  Administered 2016-01-17: 5 mL

## 2016-01-17 MED ORDER — FAMOTIDINE IN NACL 20-0.9 MG/50ML-% IV SOLN
20.0000 mg | Freq: Once | INTRAVENOUS | Status: AC
  Administered 2016-01-17: 20 mg via INTRAVENOUS

## 2016-01-17 MED ORDER — FAMOTIDINE IN NACL 20-0.9 MG/50ML-% IV SOLN
INTRAVENOUS | Status: AC
  Filled 2016-01-17: qty 50

## 2016-01-17 MED ORDER — LIDOCAINE HCL 1 % IJ SOLN
INTRAMUSCULAR | Status: AC
  Filled 2016-01-17: qty 20

## 2016-01-17 MED ORDER — HEPARIN SOD (PORK) LOCK FLUSH 100 UNIT/ML IV SOLN
500.0000 [IU] | Freq: Once | INTRAVENOUS | Status: AC
  Administered 2016-01-17: 250 [IU] via INTRAVENOUS
  Filled 2016-01-17: qty 5

## 2016-01-17 MED ORDER — PACLITAXEL CHEMO INJECTION 300 MG/50ML
80.0000 mg/m2 | Freq: Once | INTRAVENOUS | Status: AC
  Administered 2016-01-17: 162 mg via INTRAVENOUS
  Filled 2016-01-17: qty 27

## 2016-01-17 MED ORDER — SODIUM CHLORIDE 0.9% FLUSH
10.0000 mL | Freq: Once | INTRAVENOUS | Status: AC
  Administered 2016-01-17: 10 mL via INTRAVENOUS
  Filled 2016-01-17: qty 10

## 2016-01-17 MED ORDER — DIPHENHYDRAMINE HCL 50 MG/ML IJ SOLN
INTRAMUSCULAR | Status: AC
  Filled 2016-01-17: qty 1

## 2016-01-17 MED ORDER — SODIUM CHLORIDE 0.9 % IV SOLN
20.0000 mg | Freq: Once | INTRAVENOUS | Status: AC
  Administered 2016-01-17: 20 mg via INTRAVENOUS
  Filled 2016-01-17: qty 2

## 2016-01-17 MED ORDER — SODIUM CHLORIDE 0.9 % IV SOLN
Freq: Once | INTRAVENOUS | Status: AC
  Administered 2016-01-17: 11:00:00 via INTRAVENOUS

## 2016-01-17 MED ORDER — DIPHENHYDRAMINE HCL 50 MG/ML IJ SOLN
25.0000 mg | Freq: Once | INTRAMUSCULAR | Status: AC
  Administered 2016-01-17: 25 mg via INTRAVENOUS

## 2016-01-17 NOTE — Telephone Encounter (Signed)
Pt brought in prescription from cardiologist with authorization for 1 day off eloquis. Rx given to Dr Marko Plume. Dr Edwyna Shell to work on orders for [facility] placement.

## 2016-01-17 NOTE — Progress Notes (Signed)
Medical Oncology  Note received from Dr Jenny Reichmann [facility] [address] [place], [place] [address], phone [phone], fax [phone] Date 01-15-2016  "Queenie Pier - I have seen. She had a stroke felt to be embolic. A port is a small procedure- I would recommend holding Eliquis for 1 day prior and then restart"  Note to be scanned into this EMR  L.Marko Plume, MD

## 2016-01-17 NOTE — Patient Instructions (Signed)
[place] Discharge Instructions for Patients Receiving Chemotherapy  Today you received the following chemotherapy agents: Taxol.  To help prevent nausea and vomiting after your treatment, we encourage you to take your nausea medication: Compazine 10 mg every 6 hours as needed; Zofran 8 mg every 8 hours as needed.   If you develop nausea and vomiting that is not controlled by your nausea medication, call the clinic.   BELOW ARE SYMPTOMS THAT SHOULD BE REPORTED IMMEDIATELY:  *FEVER GREATER THAN 100.5 F  *CHILLS WITH OR WITHOUT FEVER  NAUSEA AND VOMITING THAT IS NOT CONTROLLED WITH YOUR NAUSEA MEDICATION  *UNUSUAL SHORTNESS OF BREATH  *UNUSUAL BRUISING OR BLEEDING  TENDERNESS IN MOUTH AND THROAT WITH OR WITHOUT PRESENCE OF ULCERS  *URINARY PROBLEMS  *BOWEL PROBLEMS  UNUSUAL RASH Items with * indicate a potential emergency and should be followed up as soon as possible.  Feel free to call the clinic you have any questions or concerns. The clinic phone number is [phone].  Please show the [facility] at check-in to the Emergency Department and triage nurse.

## 2016-01-18 ENCOUNTER — Ambulatory Visit: Payer: Medicare Other

## 2016-01-18 ENCOUNTER — Other Ambulatory Visit: Payer: Self-pay | Admitting: Oncology

## 2016-01-20 ENCOUNTER — Ambulatory Visit: Payer: Medicare Other

## 2016-01-20 ENCOUNTER — Encounter: Payer: Self-pay | Admitting: Oncology

## 2016-01-20 ENCOUNTER — Ambulatory Visit (HOSPITAL_BASED_OUTPATIENT_CLINIC_OR_DEPARTMENT_OTHER): Payer: Medicare Other | Admitting: Oncology

## 2016-01-20 ENCOUNTER — Telehealth: Payer: Self-pay | Admitting: Oncology

## 2016-01-20 VITALS — BP 131/82 | HR 80 | Temp 97.7°F | Resp 18 | Ht 68.5 in | Wt 186.5 lb

## 2016-01-20 DIAGNOSIS — T451X5A Adverse effect of antineoplastic and immunosuppressive drugs, initial encounter: Secondary | ICD-10-CM

## 2016-01-20 DIAGNOSIS — I4891 Unspecified atrial fibrillation: Secondary | ICD-10-CM

## 2016-01-20 DIAGNOSIS — C774 Secondary and unspecified malignant neoplasm of inguinal and lower limb lymph nodes: Secondary | ICD-10-CM

## 2016-01-20 DIAGNOSIS — Z853 Personal history of malignant neoplasm of breast: Secondary | ICD-10-CM | POA: Diagnosis not present

## 2016-01-20 DIAGNOSIS — C569 Malignant neoplasm of unspecified ovary: Secondary | ICD-10-CM

## 2016-01-20 DIAGNOSIS — I482 Chronic atrial fibrillation, unspecified: Secondary | ICD-10-CM

## 2016-01-20 DIAGNOSIS — Z7901 Long term (current) use of anticoagulants: Secondary | ICD-10-CM

## 2016-01-20 DIAGNOSIS — Z8673 Personal history of transient ischemic attack (TIA), and cerebral infarction without residual deficits: Secondary | ICD-10-CM | POA: Diagnosis not present

## 2016-01-20 DIAGNOSIS — Z95828 Presence of other vascular implants and grafts: Secondary | ICD-10-CM

## 2016-01-20 DIAGNOSIS — R4701 Aphasia: Secondary | ICD-10-CM

## 2016-01-20 DIAGNOSIS — I878 Other specified disorders of veins: Secondary | ICD-10-CM

## 2016-01-20 DIAGNOSIS — D701 Agranulocytosis secondary to cancer chemotherapy: Secondary | ICD-10-CM

## 2016-01-20 DIAGNOSIS — C801 Malignant (primary) neoplasm, unspecified: Secondary | ICD-10-CM

## 2016-01-20 DIAGNOSIS — I10 Essential (primary) hypertension: Secondary | ICD-10-CM | POA: Diagnosis not present

## 2016-01-20 DIAGNOSIS — Z452 Encounter for adjustment and management of vascular access device: Secondary | ICD-10-CM

## 2016-01-20 MED ORDER — SODIUM CHLORIDE 0.9% FLUSH
10.0000 mL | INTRAVENOUS | Status: DC | PRN
Start: 2016-01-20 — End: 2016-01-20
  Administered 2016-01-20: 10 mL via INTRAVENOUS
  Filled 2016-01-20: qty 10

## 2016-01-20 MED ORDER — HEPARIN SOD (PORK) LOCK FLUSH 100 UNIT/ML IV SOLN
250.0000 [IU] | Freq: Once | INTRAVENOUS | Status: AC
  Administered 2016-01-20: 250 [IU] via INTRAVENOUS
  Filled 2016-01-20: qty 5

## 2016-01-20 MED ORDER — SODIUM CHLORIDE 0.9% FLUSH
10.0000 mL | INTRAVENOUS | Status: DC | PRN
  Administered 2016-01-20: 10 mL via INTRAVENOUS
  Filled 2016-01-20: qty 10

## 2016-01-20 NOTE — Telephone Encounter (Signed)
Appointments made and avs printed °

## 2016-01-20 NOTE — Progress Notes (Signed)
OFFICE PROGRESS NOTE   January 20, 2016   Physicians: Everitt Amber, Lendon Collar (PCP), Lindell Spar [phone] neurology [place]), Dr Valetta Fuller (cardiology [place], [phone], new patient apt 01-15-16) Carol Ada (Alapati P. Marcello Moores, PCP [place], [place] [phone]) ( (oncologist Dr Maudie Mercury in [place], [place]; breast radiation given at [facility] in [place]). 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 She had day 1 cycle 1 dose dense carbo taxol on 12-27-15, then missed day 8 cycle 1 due to lack of IV access. ANC was 0.1 on day 14 (01-09-16) with granix given x3 and day 15 delayed another week. She received day 15 cycle 1 on 01-17-16 after PICC placed that day. Patient is on Eliquis for A fib with embolic CVA [id number]. PICC was placed for urgently needed IV access as Eliquis did not need to be held for that procedure. Patient has now established with new cardiologist Dr Valetta Fuller, who recommends holding Eliquis x 1 day prior to [facility] then restarting. Patient and daughter are in agreement with using PICC until [facility] can be placed, understand that I will discuss with IR as standard procedure for IR is to hold Eliquis x 48 hrs for that procedure. She is to see Dr Prince Rome again in several months. Plan is to reevaluate for possible interval surgery by gyn oncology after 3 cycles.  PMH significant for left breast ca 2005, all treatment in [place] and records not yet located.   Patient seems to be tolerating chemotherapy very adequately so far, and to have less abdominal discomfort, tho history is always difficult for both MD and family due to expressive speech difficulty residual from CVA. She has had no severe nausea, has been able to eat and to drink fluids, bowels seem  to be moving. She denies increased peripheral neuropathy. PICC is not uncomfortable, no swelling RUE and no bleeding. She did not have fever or symptoms of infection when neutropenic. No apparent increased SOB, may be complaining of some discomfort LLE, no swelling LE. Has been trying to do exercises at home as previously instructed by PT/OT; doubt can manage regular outpatient PT/ OT at least at this point in treatment of the gyn malignancy. No new or different neurologic symptoms. Remainder of 10 point Review of Systems seems stable   CA 125 12-24-15 by [employer] ECLIA   608, and on 01-01-16    741 PICC placed RUE by IR 01-17-16 Flu vaccine 10-10-15 No genetics testing known  ONCOLOGIC HISTORY Patient presented to ED in [place] 11-28-15 with LLQ pain and left hip pain, symptomatic for at least a year per family but worse. CT AP 11-28-15 had bilateral ovarian masses up to 4 cm on right, with peritoneal carcinomatosis in pelvis, omentum and perihepatic, with small ascites, bilateral pelvic and inguinal adenopathy, with retroperitoneal adenopathy extending to pelvic brim. 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 Recommendation was to begin treatment with carboplatin taxol, with reevaluation after 3 cycles for possible interval debulking surgery. Day 1 cycle 1 dose dense carbo taxol was given on 12-27-15,  then missed day 8 cycle 1 due to lack of IV access. ANC was 0.1 on day 14 (01-09-16) with granix given x3 and day 15 delayed another week. She received day 15 cycle 1 on 01-17-16 after PICC placed that day.  She has history of left breast cancer 2005 treated in [place] with lumpectomy, chemotherapy and radiation,  none of those records presently available.   Objective:  Vital signs in last 24 hours:  BP 131/82 mmHg  Pulse 80  Temp(Src) 97.7 F (36.5 C) (Oral)  Resp 18  Ht 5' 8.5" (1.74 m)  Wt 186 lb 8 oz (84.596 kg)  BMI 27.94 kg/m2  SpO2 100% Weight stable Alert, oriented and appropriate. Ambulatory with usual difficulty with right sided hemiparesis. Looks comfortable and in good spirits, respirations not labored RA. Wearing wig, neatly groomed.  Daughter very supportive.  HEENT:PERRL, sclerae not icteric. Oral mucosa moist without lesions, posterior pharynx clear.  Neck supple. No JVD.  Lymphatics:no cervical,supraclavicular, axillary or inguinal adenopathy Resp: clear to auscultation bilaterally and normal percussion bilaterally Cardio: irregularly irregular rate and rhythm. No gallop. GI: soft, nontender, not obviously distended, no clear mass or organomegaly. Some bowel sounds.  Musculoskeletal/ Extremities: without pitting edema, cords, tenderness. Decreased muscle mass RUE with minimal use right hand. Neuro: no increased peripheral neuropathy. Expressive speech difficulty, right hemiparesis RUE > RLE Skin without rash, ecchymosis, petechiae Not undressed for repeat breast exam today. PICC-without erythema or tenderness, no swelling, flused today.  Lab Results:  Results for orders placed or performed in visit on 01/17/16  CBC with Differential  Result Value Ref Range   WBC 8.0 3.9 - 10.3 10e3/uL   NEUT# 7.0 (H) 1.5 - 6.5 10e3/uL   HGB 10.4 (L) 11.6 - 15.9 g/dL   HCT 30.7 (L) 34.8 - 46.6 %   Platelets 241 145 - 400 10e3/uL   MCV 91.9 79.5 - 101.0 fL   MCH 31.1 25.1 - 34.0 pg   MCHC 33.9 31.5 - 36.0 g/dL   RBC 3.34 (L) 3.70 - 5.45 10e6/uL   RDW 16.0 (H) 11.2 - 14.5 %   lymph# 1.0 0.9 - 3.3 10e3/uL   MONO# 0.0 (L) 0.1 - 0.9 10e3/uL   Eosinophils Absolute 0.0 0.0 - 0.5 10e3/uL   Basophils Absolute 0.0 0.0 - 0.1 10e3/uL   NEUT% 87.0 (H) 38.4 - 76.8 %   LYMPH% 12.5 (L) 14.0  - 49.7 %   MONO% 0.5 0.0 - 14.0 %   EOS% 0.0 0.0 - 7.0 %   BASO% 0.0 0.0 - 2.0 %   nRBC 0 0 - 0 %  Comprehensive metabolic panel  Result Value Ref Range   Sodium 137 136 - 145 mEq/L   Potassium 3.9 3.5 - 5.1 mEq/L   Chloride 104 98 - 109 mEq/L   CO2 21 (L) 22 - 29 mEq/L   Glucose 179 (H) 70 - 140 mg/dl   BUN 11.2 7.0 - 26.0 mg/dL   Creatinine 0.8 0.6 - 1.1 mg/dL   Total Bilirubin 0.74 0.20 - 1.20 mg/dL   Alkaline Phosphatase 175 (H) 40 - 150 U/L   AST 17 5 - 34 U/L   ALT 22 0 - 55 U/L   Total Protein 7.9 6.4 - 8.3 g/dL   Albumin 3.2 (L) 3.5 - 5.0 g/dL   Calcium 9.8 8.4 - 10.4 mg/dL   Anion Gap 12 (H) 3 - 11 mEq/L   EGFR >90 >90 ml/min/1.73 m2     Studies/Results:  No results found.  Medications: I have reviewed the patient's current medications. Will use granix days 2,3,9 and possibly 16. Careplan includes aloxi. Message to [facility] pharmacist re timing of zofran after aloxi.    DISCUSSION  We are glad PICC has allowed her to receive chemo now. Tried to understand from patient if she prefers to go ahead with [facility] placement vs keeping PICC, however cannot tell that she understands this and she cannnot communicate her preference in this regard. From standpoint of infection risk and additional trips to office, PAC preferable and daughter feels this would be best. I will be in touch with IR to clarify Eliquis dosing, appreciate Dr Prince Rome sending recommendations.  Patient and daughter are in agreement with continuing treatment with dose dense carbo taxol, with day 1 cycle 2 on 01-24-16 and granix 480 mg on 2-18 and 2-20. Will continue PICC flushes 2x weekly while that is in place. She will have day 8 cycle 2 on 2-24 as long as ANC >=1.5 and plt >=100k, and I will see her 2-27 prior to day 15 cycle 2 on 02-07-16.     Message to [facility] HIM to request copy of Dr Prince Rome' note from ~ 01-15-16.  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 Patient was mildly symptomatic with LLQ pain at presentation. Day 8 cycle 1 held and day 15 cycle 1 delayed due to lack of peripheral IV access and neutropenia. Granix added. PICC in, likely will change to [facility]. 2. 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 Hopefully will be able to resume outpatient PT/ OT/ speech therapy in near future. Followed by neurology in [facility]. History of TIAs 2010  3.inadequate peripheral IV access: now established with cardiologist Dr Prince Rome, who has given recommendations for Eliquis around [facility]. I have LM with IR to discuss, expect to schedule PAC when logistics decided. 4.atrial fibrillation diagnosed ~ 2011, previously on xeloda and now Eliquis + ASA. CHADS2 score I believe 3. Saw Dr Prince Rome for new patient appointment  01-15-16. [place] HIM to request that consultation note; written note brought by daughter with his recommendations for Eliquis. 5.Left breast cancer 2005 diagnosed and treated in [place] with lumpectomy and presumably axillary node evaluation, combination chemotherapy (drugs not known) via peripheral veins, local radiation. Up to date on mammograms, done 10-18-15 at [facility]. We will try to get records from prior PCP in [place], as family not able to provide names or contacts of oncologists. 6.hx HTN 7.colon polyps at colonoscopy by Dr Benson Norway 2015. Follow up not specified on the report in EMR 8.post appendectomy, BTL 9.family history of cardiac disease with MIs in sisters ages 29 and 3, and in brother 10.flu vaccine done 10-2015 23.information on Advance Directives given 12.chemo neutropenia with ANC 0.1 on day 14 cycle 1 (despite day 8 held). To receive gCSF from here, monitor counts.  All questions answered. Chemo and granix orders placed.  Message left for IR and request to HIM. Time spent  30 min including >50% counseling and coordination of care.   LIVESAY,LENNIS P, MD   01/20/2016, 1:10 PM

## 2016-01-22 ENCOUNTER — Other Ambulatory Visit: Payer: Self-pay | Admitting: Oncology

## 2016-01-22 ENCOUNTER — Telehealth: Payer: Self-pay | Admitting: Oncology

## 2016-01-22 DIAGNOSIS — I878 Other specified disorders of veins: Secondary | ICD-10-CM

## 2016-01-22 DIAGNOSIS — C561 Malignant neoplasm of right ovary: Secondary | ICD-10-CM

## 2016-01-22 DIAGNOSIS — D701 Agranulocytosis secondary to cancer chemotherapy: Secondary | ICD-10-CM | POA: Insufficient documentation

## 2016-01-22 DIAGNOSIS — C563 Malignant neoplasm of bilateral ovaries: Secondary | ICD-10-CM

## 2016-01-22 DIAGNOSIS — Z452 Encounter for adjustment and management of vascular access device: Secondary | ICD-10-CM | POA: Insufficient documentation

## 2016-01-22 DIAGNOSIS — C562 Malignant neoplasm of left ovary: Principal | ICD-10-CM

## 2016-01-22 DIAGNOSIS — T451X5A Adverse effect of antineoplastic and immunosuppressive drugs, initial encounter: Secondary | ICD-10-CM | POA: Insufficient documentation

## 2016-01-22 DIAGNOSIS — Z7901 Long term (current) use of anticoagulants: Secondary | ICD-10-CM | POA: Insufficient documentation

## 2016-01-22 NOTE — Telephone Encounter (Signed)
MEDICAL ONCOLOGY  Spoke with [facility] IR, then with Dr Kathlene Cote re Eliquis around [facility], as cardiologist with [facility] has recommended holding Eliquis X 24 hrs prior to [facility] (rather than 48 hrs as is IR protocol). Dr Kathlene Cote in agreement with that schedule for Eliquis. Comment placed into order for power PAC now.  Message to RN to let daughter know to hold Eliquis day prior to [facility] placement and resume following PAC placement as long as ok with IR then.  Godfrey Pick, MD

## 2016-01-23 ENCOUNTER — Telehealth: Payer: Self-pay | Admitting: Oncology

## 2016-01-23 ENCOUNTER — Other Ambulatory Visit: Payer: Self-pay | Admitting: Oncology

## 2016-01-23 ENCOUNTER — Telehealth: Payer: Self-pay

## 2016-01-23 NOTE — Telephone Encounter (Signed)
Added lab to 2/20 appointments and picc removal after 2/22 port placement appointment per 2/16 pof. Not able to reach patient by phone or leave message. Patient to get new schedule at next appointment 2/17.

## 2016-01-23 NOTE — Telephone Encounter (Signed)
Port placement is scheduled for 2/22.  Per LL:  add cbc only on 2/20 - POF sent  Pt to hold eliquis AM and PM doses on 2/21 and AM dose on 2/22. Pt is to take eliquis PM dose on 2/22.  PICC removal should be scheduled on 2/22 while pt is still off eliquis - POF sent for late afternoon treatment room picc removal.  LVM for daughter Kenney Houseman to call back for these instructions.

## 2016-01-24 ENCOUNTER — Other Ambulatory Visit (HOSPITAL_BASED_OUTPATIENT_CLINIC_OR_DEPARTMENT_OTHER): Payer: Medicare Other

## 2016-01-24 ENCOUNTER — Ambulatory Visit: Payer: Medicare Other

## 2016-01-24 ENCOUNTER — Ambulatory Visit (HOSPITAL_BASED_OUTPATIENT_CLINIC_OR_DEPARTMENT_OTHER): Payer: Medicare Other

## 2016-01-24 ENCOUNTER — Other Ambulatory Visit: Payer: Self-pay | Admitting: Oncology

## 2016-01-24 ENCOUNTER — Ambulatory Visit (HOSPITAL_BASED_OUTPATIENT_CLINIC_OR_DEPARTMENT_OTHER): Payer: Medicare Other | Admitting: Nurse Practitioner

## 2016-01-24 VITALS — BP 126/64 | HR 81 | Temp 97.8°F | Resp 19

## 2016-01-24 DIAGNOSIS — C562 Malignant neoplasm of left ovary: Secondary | ICD-10-CM

## 2016-01-24 DIAGNOSIS — T148XXA Other injury of unspecified body region, initial encounter: Secondary | ICD-10-CM

## 2016-01-24 DIAGNOSIS — Z5111 Encounter for antineoplastic chemotherapy: Secondary | ICD-10-CM | POA: Diagnosis not present

## 2016-01-24 DIAGNOSIS — T148 Other injury of unspecified body region: Secondary | ICD-10-CM

## 2016-01-24 DIAGNOSIS — Z452 Encounter for adjustment and management of vascular access device: Secondary | ICD-10-CM

## 2016-01-24 DIAGNOSIS — C563 Malignant neoplasm of bilateral ovaries: Secondary | ICD-10-CM

## 2016-01-24 DIAGNOSIS — C561 Malignant neoplasm of right ovary: Secondary | ICD-10-CM

## 2016-01-24 DIAGNOSIS — C800 Disseminated malignant neoplasm, unspecified: Secondary | ICD-10-CM

## 2016-01-24 DIAGNOSIS — I4891 Unspecified atrial fibrillation: Secondary | ICD-10-CM | POA: Diagnosis not present

## 2016-01-24 LAB — CBC WITH DIFFERENTIAL/PLATELET
BASO%: 0 % (ref 0.0–2.0)
Basophils Absolute: 0 10*3/uL (ref 0.0–0.1)
EOS%: 0 % (ref 0.0–7.0)
Eosinophils Absolute: 0 10*3/uL (ref 0.0–0.5)
HCT: 31.2 % — ABNORMAL LOW (ref 34.8–46.6)
HGB: 10.6 g/dL — ABNORMAL LOW (ref 11.6–15.9)
LYMPH%: 27.7 % (ref 14.0–49.7)
MCH: 31.3 pg (ref 25.1–34.0)
MCHC: 34 g/dL (ref 31.5–36.0)
MCV: 92 fL (ref 79.5–101.0)
MONO#: 0 10*3/uL — ABNORMAL LOW (ref 0.1–0.9)
MONO%: 0.5 % (ref 0.0–14.0)
NEUT#: 1.4 10*3/uL — ABNORMAL LOW (ref 1.5–6.5)
NEUT%: 71.8 % (ref 38.4–76.8)
Platelets: 232 10*3/uL (ref 145–400)
RBC: 3.39 10*6/uL — ABNORMAL LOW (ref 3.70–5.45)
RDW: 16.1 % — ABNORMAL HIGH (ref 11.2–14.5)
WBC: 1.9 10*3/uL — ABNORMAL LOW (ref 3.9–10.3)
lymph#: 0.5 10*3/uL — ABNORMAL LOW (ref 0.9–3.3)

## 2016-01-24 LAB — COMPREHENSIVE METABOLIC PANEL
ALT: 37 U/L (ref 0–55)
AST: 17 U/L (ref 5–34)
Albumin: 3.4 g/dL — ABNORMAL LOW (ref 3.5–5.0)
Alkaline Phosphatase: 129 U/L (ref 40–150)
Anion Gap: 9 mEq/L (ref 3–11)
BUN: 19.5 mg/dL (ref 7.0–26.0)
CO2: 23 mEq/L (ref 22–29)
Calcium: 9.8 mg/dL (ref 8.4–10.4)
Chloride: 105 mEq/L (ref 98–109)
Creatinine: 0.8 mg/dL (ref 0.6–1.1)
EGFR: 87 mL/min/{1.73_m2} — ABNORMAL LOW (ref >90)
Glucose: 176 mg/dl — ABNORMAL HIGH (ref 70–140)
Potassium: 4 mEq/L (ref 3.5–5.1)
Sodium: 138 mEq/L (ref 136–145)
Total Bilirubin: 1.44 mg/dL — ABNORMAL HIGH (ref 0.20–1.20)
Total Protein: 8 g/dL (ref 6.4–8.3)

## 2016-01-24 MED ORDER — SODIUM CHLORIDE 0.9 % IV SOLN
20.0000 mg | Freq: Once | INTRAVENOUS | Status: AC
  Administered 2016-01-24: 20 mg via INTRAVENOUS
  Filled 2016-01-24: qty 2

## 2016-01-24 MED ORDER — SODIUM CHLORIDE 0.9 % IJ SOLN
10.0000 mL | INTRAMUSCULAR | Status: DC | PRN
  Filled 2016-01-24: qty 10

## 2016-01-24 MED ORDER — PACLITAXEL CHEMO INJECTION 300 MG/50ML
80.0000 mg/m2 | Freq: Once | INTRAVENOUS | Status: AC
  Administered 2016-01-24: 162 mg via INTRAVENOUS
  Filled 2016-01-24: qty 27

## 2016-01-24 MED ORDER — FAMOTIDINE IN NACL 20-0.9 MG/50ML-% IV SOLN
20.0000 mg | Freq: Once | INTRAVENOUS | Status: AC
  Administered 2016-01-24: 20 mg via INTRAVENOUS

## 2016-01-24 MED ORDER — FAMOTIDINE IN NACL 20-0.9 MG/50ML-% IV SOLN
INTRAVENOUS | Status: AC
  Filled 2016-01-24: qty 50

## 2016-01-24 MED ORDER — SODIUM CHLORIDE 0.9 % IV SOLN
Freq: Once | INTRAVENOUS | Status: AC
  Administered 2016-01-24: 12:00:00 via INTRAVENOUS

## 2016-01-24 MED ORDER — SODIUM CHLORIDE 0.9% FLUSH
10.0000 mL | INTRAVENOUS | Status: DC | PRN
  Administered 2016-01-24: 10 mL via INTRAVENOUS
  Filled 2016-01-24: qty 10

## 2016-01-24 MED ORDER — DIPHENHYDRAMINE HCL 50 MG/ML IJ SOLN
INTRAMUSCULAR | Status: AC
  Filled 2016-01-24: qty 1

## 2016-01-24 MED ORDER — DIPHENHYDRAMINE HCL 50 MG/ML IJ SOLN
25.0000 mg | Freq: Once | INTRAMUSCULAR | Status: AC
Start: 2016-01-24 — End: 2016-01-24
  Administered 2016-01-24: 25 mg via INTRAVENOUS

## 2016-01-24 MED ORDER — HEPARIN SOD (PORK) LOCK FLUSH 100 UNIT/ML IV SOLN
250.0000 [IU] | Freq: Once | INTRAVENOUS | Status: DC | PRN
  Filled 2016-01-24: qty 5

## 2016-01-24 NOTE — Patient Instructions (Signed)
PICC Home Guide A peripherally inserted central catheter (PICC) is a long, thin, flexible tube that is inserted into a vein in the upper arm. It is a form of intravenous (IV) access. It is considered to be a "central" line because the tip of the PICC ends in a large vein in your chest. This large vein is called the superior vena cava (SVC). The PICC tip ends in the SVC because there is a lot of blood flow in the SVC. This allows medicines and IV fluids to be quickly distributed throughout the body. The PICC is inserted using a sterile technique by a specially trained nurse or physician. After the PICC is inserted, a chest X-ray exam is done to be sure it is in the correct place.  A PICC may be placed for different reasons, such as:  To give medicines and liquid nutrition that can only be given through a central line. Examples are:  Certain antibiotic treatments.  Chemotherapy.  Total parenteral nutrition (TPN).  To take frequent blood samples.  To give IV fluids and blood products.  If there is difficulty placing a peripheral intravenous (PIV) catheter. If taken care of properly, a PICC can remain in place for several months. A PICC can also allow a person to go home from the hospital early. Medicine and PICC care can be managed at home by a family member or home health care team. WHAT PROBLEMS CAN HAPPEN WHEN I HAVE A PICC? Problems with a PICC can occasionally occur. These may include the following:  A blood clot (thrombus) forming in or at the tip of the PICC. This can cause the PICC to become clogged. A clot-dissolving medicine called tissue plasminogen activator (tPA) can be given through the PICC to help break up the clot.  Inflammation of the vein (phlebitis) in which the PICC is placed. Signs of inflammation may include redness, pain at the insertion site, red streaks, or being able to feel a "cord" in the vein where the PICC is located.  Infection in the PICC or at the insertion  site. Signs of infection may include fever, chills, redness, swelling, or pus drainage from the PICC insertion site.  PICC movement (malposition). The PICC tip may move from its original position due to excessive physical activity, forceful coughing, sneezing, or vomiting.  A break or cut in the PICC. It is important to not use scissors near the PICC.  Nerve or tendon irritation or injury during PICC insertion. WHAT SHOULD I KEEP IN MIND ABOUT ACTIVITIES WHEN I HAVE A PICC?  You may bend your arm and move it freely. If your PICC is near or at the bend of your elbow, avoid activity with repeated motion at the elbow.  Rest at home for the remainder of the day following PICC line insertion.  Avoid lifting heavy objects as instructed by your health care provider.  Avoid using a crutch with the arm on the same side as your PICC. You may need to use a walker. WHAT SHOULD I KNOW ABOUT MY PICC DRESSING?  Keep your PICC bandage (dressing) clean and dry to prevent infection.  Ask your health care provider when you may shower. Ask your health care provider to teach you how to wrap the PICC when you do take a shower.  Change the PICC dressing as instructed by your health care provider.  Change your PICC dressing if it becomes loose or wet. WHAT SHOULD I KNOW ABOUT PICC CARE?  Check the PICC insertion site   daily for leakage, redness, swelling, or pain.  Do not take a bath, swim, or use hot tubs when you have a PICC. Cover PICC line with clear plastic wrap and tape to keep it dry while showering.  Flush the PICC as directed by your health care provider. Let your health care provider know right away if the PICC is difficult to flush or does not flush. Do not use force to flush the PICC.  Do not use a syringe that is less than 10 mL to flush the PICC.  Never pull or tug on the PICC.  Avoid blood pressure checks on the arm with the PICC.  Keep your PICC identification card with you at all  times.  Do not take the PICC out yourself. Only a trained clinical professional should remove the PICC. SEEK IMMEDIATE MEDICAL CARE IF:  Your PICC is accidentally pulled all the way out. If this happens, cover the insertion site with a bandage or gauze dressing. Do not throw the PICC away. Your health care provider will need to inspect it.  Your PICC was tugged or pulled and has partially come out. Do not  push the PICC back in.  There is any type of drainage, redness, or swelling where the PICC enters the skin.  You cannot flush the PICC, it is difficult to flush, or the PICC leaks around the insertion site when it is flushed.  You hear a "flushing" sound when the PICC is flushed.  You have pain, discomfort, or numbness in your arm, shoulder, or jaw on the same side as the PICC.  You feel your heart "racing" or skipping beats.  You notice a hole or tear in the PICC.  You develop chills or a fever. MAKE SURE YOU:   Understand these instructions.  Will watch your condition.  Will get help right away if you are not doing well or get worse.   This information is not intended to replace advice given to you by your health care provider. Make sure you discuss any questions you have with your health care provider.   Document Released: 05/30/2003 Document Revised: 12/14/2014 Document Reviewed: 07/31/2013 Elsevier Interactive Patient Education 2016 Elsevier Inc.  

## 2016-01-24 NOTE — Patient Instructions (Signed)
[facility] Discharge Instructions for Patients Receiving Chemotherapy  Today you received the following chemotherapy agents:  Taxotere.  To help prevent nausea and vomiting after your treatment, we encourage you to take your nausea medication as directed.   If you develop nausea and vomiting that is not controlled by your nausea medication, call the clinic.   BELOW ARE SYMPTOMS THAT SHOULD BE REPORTED IMMEDIATELY:  *FEVER GREATER THAN 100.5 F  *CHILLS WITH OR WITHOUT FEVER  NAUSEA AND VOMITING THAT IS NOT CONTROLLED WITH YOUR NAUSEA MEDICATION  *UNUSUAL SHORTNESS OF BREATH  *UNUSUAL BRUISING OR BLEEDING  TENDERNESS IN MOUTH AND THROAT WITH OR WITHOUT PRESENCE OF ULCERS  *URINARY PROBLEMS  *BOWEL PROBLEMS  UNUSUAL RASH Items with * indicate a potential emergency and should be followed up as soon as possible.  Feel free to call the clinic you have any questions or concerns. The clinic phone number is [phone].  Please show the CHEMO ALERT CARD at check-in to the Emergency Department and triage nurse.  

## 2016-01-24 NOTE — Progress Notes (Signed)
Cyndee B NP evaluated patient's right arm and PICC site today prior to treatment and feels the PICC line is ok to use today for treatment, the reddened areas are possibly small hematomas. Patient now denies any pain to that arm.  Cyndee also received a verbal order over the telephone from Dr. Marko Plume to treat today with ANC value today - NO CARBO and Low dose Taxol. Patient and family aware.

## 2016-01-25 ENCOUNTER — Ambulatory Visit (HOSPITAL_BASED_OUTPATIENT_CLINIC_OR_DEPARTMENT_OTHER): Payer: Medicare Other

## 2016-01-25 VITALS — BP 124/69 | HR 82 | Temp 98.0°F | Resp 17 | Ht 68.5 in

## 2016-01-25 DIAGNOSIS — C563 Malignant neoplasm of bilateral ovaries: Secondary | ICD-10-CM

## 2016-01-25 DIAGNOSIS — C562 Malignant neoplasm of left ovary: Secondary | ICD-10-CM

## 2016-01-25 DIAGNOSIS — Z5189 Encounter for other specified aftercare: Secondary | ICD-10-CM

## 2016-01-25 DIAGNOSIS — C561 Malignant neoplasm of right ovary: Secondary | ICD-10-CM | POA: Diagnosis present

## 2016-01-25 MED ORDER — TBO-FILGRASTIM 480 MCG/0.8ML ~~LOC~~ SOSY
480.0000 ug | PREFILLED_SYRINGE | Freq: Once | SUBCUTANEOUS | Status: AC
  Administered 2016-01-25: 480 ug via SUBCUTANEOUS

## 2016-01-27 ENCOUNTER — Emergency Department (HOSPITAL_COMMUNITY): Payer: Medicare Other

## 2016-01-27 ENCOUNTER — Ambulatory Visit: Payer: Medicare Other

## 2016-01-27 ENCOUNTER — Ambulatory Visit (HOSPITAL_BASED_OUTPATIENT_CLINIC_OR_DEPARTMENT_OTHER): Payer: Medicare Other

## 2016-01-27 ENCOUNTER — Encounter (HOSPITAL_COMMUNITY): Payer: Self-pay | Admitting: Emergency Medicine

## 2016-01-27 ENCOUNTER — Encounter: Payer: Self-pay | Admitting: Nurse Practitioner

## 2016-01-27 ENCOUNTER — Emergency Department (HOSPITAL_COMMUNITY)
Admission: EM | Admit: 2016-01-27 | Discharge: 2016-01-27 | Disposition: A | Discharge status: Home / Self Care | Payer: Medicare Other | Attending: Emergency Medicine | Admitting: Emergency Medicine

## 2016-01-27 ENCOUNTER — Other Ambulatory Visit: Payer: Self-pay

## 2016-01-27 VITALS — BP 127/62 | HR 83 | Temp 97.7°F

## 2016-01-27 DIAGNOSIS — I10 Essential (primary) hypertension: Secondary | ICD-10-CM | POA: Diagnosis not present

## 2016-01-27 DIAGNOSIS — E785 Hyperlipidemia, unspecified: Secondary | ICD-10-CM | POA: Diagnosis not present

## 2016-01-27 DIAGNOSIS — Z853 Personal history of malignant neoplasm of breast: Secondary | ICD-10-CM | POA: Diagnosis not present

## 2016-01-27 DIAGNOSIS — C562 Malignant neoplasm of left ovary: Secondary | ICD-10-CM | POA: Diagnosis not present

## 2016-01-27 DIAGNOSIS — R42 Dizziness and giddiness: Secondary | ICD-10-CM | POA: Diagnosis not present

## 2016-01-27 DIAGNOSIS — Z8673 Personal history of transient ischemic attack (TIA), and cerebral infarction without residual deficits: Secondary | ICD-10-CM | POA: Insufficient documentation

## 2016-01-27 DIAGNOSIS — C561 Malignant neoplasm of right ovary: Secondary | ICD-10-CM

## 2016-01-27 DIAGNOSIS — Z7982 Long term (current) use of aspirin: Secondary | ICD-10-CM | POA: Diagnosis not present

## 2016-01-27 DIAGNOSIS — Z452 Encounter for adjustment and management of vascular access device: Secondary | ICD-10-CM

## 2016-01-27 DIAGNOSIS — Z5189 Encounter for other specified aftercare: Secondary | ICD-10-CM

## 2016-01-27 DIAGNOSIS — C800 Disseminated malignant neoplasm, unspecified: Secondary | ICD-10-CM

## 2016-01-27 DIAGNOSIS — R55 Syncope and collapse: Secondary | ICD-10-CM | POA: Insufficient documentation

## 2016-01-27 DIAGNOSIS — I639 Cerebral infarction, unspecified: Secondary | ICD-10-CM | POA: Diagnosis not present

## 2016-01-27 DIAGNOSIS — T148XXA Other injury of unspecified body region, initial encounter: Secondary | ICD-10-CM | POA: Insufficient documentation

## 2016-01-27 DIAGNOSIS — M1990 Unspecified osteoarthritis, unspecified site: Secondary | ICD-10-CM | POA: Insufficient documentation

## 2016-01-27 DIAGNOSIS — R2231 Localized swelling, mass and lump, right upper limb: Secondary | ICD-10-CM | POA: Insufficient documentation

## 2016-01-27 DIAGNOSIS — Z7901 Long term (current) use of anticoagulants: Secondary | ICD-10-CM | POA: Diagnosis not present

## 2016-01-27 DIAGNOSIS — F419 Anxiety disorder, unspecified: Secondary | ICD-10-CM | POA: Diagnosis not present

## 2016-01-27 DIAGNOSIS — I4891 Unspecified atrial fibrillation: Secondary | ICD-10-CM | POA: Diagnosis not present

## 2016-01-27 DIAGNOSIS — Z79899 Other long term (current) drug therapy: Secondary | ICD-10-CM | POA: Insufficient documentation

## 2016-01-27 DIAGNOSIS — C563 Malignant neoplasm of bilateral ovaries: Secondary | ICD-10-CM

## 2016-01-27 DIAGNOSIS — R404 Transient alteration of awareness: Secondary | ICD-10-CM | POA: Diagnosis not present

## 2016-01-27 LAB — COMPREHENSIVE METABOLIC PANEL
ALT: 50 U/L (ref 0–55)
AST: 23 U/L (ref 5–34)
Albumin: 3.4 g/dL — ABNORMAL LOW (ref 3.5–5.0)
Alkaline Phosphatase: 129 U/L (ref 40–150)
Anion Gap: 9 mEq/L (ref 3–11)
BUN: 15.9 mg/dL (ref 7.0–26.0)
CO2: 25 mEq/L (ref 22–29)
Calcium: 9.6 mg/dL (ref 8.4–10.4)
Chloride: 105 mEq/L (ref 98–109)
Creatinine: 0.8 mg/dL (ref 0.6–1.1)
EGFR: 85 mL/min/{1.73_m2} — ABNORMAL LOW (ref >90)
Glucose: 92 mg/dl (ref 70–140)
Potassium: 3.9 mEq/L (ref 3.5–5.1)
Sodium: 140 mEq/L (ref 136–145)
Total Bilirubin: 2.48 mg/dL — ABNORMAL HIGH (ref 0.20–1.20)
Total Protein: 7.9 g/dL (ref 6.4–8.3)

## 2016-01-27 LAB — CBC WITH DIFFERENTIAL/PLATELET
BASO%: 0 % (ref 0.0–2.0)
Basophils Absolute: 0 10*3/uL (ref 0.0–0.1)
EOS%: 0.1 % (ref 0.0–7.0)
Eosinophils Absolute: 0 10*3/uL (ref 0.0–0.5)
HCT: 33.2 % — ABNORMAL LOW (ref 34.8–46.6)
HGB: 11.3 g/dL — ABNORMAL LOW (ref 11.6–15.9)
LYMPH%: 24.8 % (ref 14.0–49.7)
MCH: 31.1 pg (ref 25.1–34.0)
MCHC: 34 g/dL (ref 31.5–36.0)
MCV: 91.5 fL (ref 79.5–101.0)
MONO#: 0.1 10*3/uL (ref 0.1–0.9)
MONO%: 0.7 % (ref 0.0–14.0)
NEUT#: 6.3 10*3/uL (ref 1.5–6.5)
NEUT%: 74.4 % (ref 38.4–76.8)
Platelets: 203 10*3/uL (ref 145–400)
RBC: 3.63 10*6/uL — ABNORMAL LOW (ref 3.70–5.45)
RDW: 16.4 % — ABNORMAL HIGH (ref 11.2–14.5)
WBC: 8.5 10*3/uL (ref 3.9–10.3)
lymph#: 2.1 10*3/uL (ref 0.9–3.3)

## 2016-01-27 LAB — CBG MONITORING, ED: Glucose-Capillary: 80 mg/dL (ref 65–99)

## 2016-01-27 MED ORDER — SODIUM CHLORIDE 0.9% FLUSH
10.0000 mL | INTRAVENOUS | Status: DC | PRN
  Administered 2016-01-27: 10 mL via INTRAVENOUS
  Filled 2016-01-27: qty 10

## 2016-01-27 MED ORDER — TBO-FILGRASTIM 480 MCG/0.8ML ~~LOC~~ SOSY
480.0000 ug | PREFILLED_SYRINGE | Freq: Once | SUBCUTANEOUS | Status: AC
  Administered 2016-01-27: 480 ug via SUBCUTANEOUS
  Filled 2016-01-27: qty 0.8

## 2016-01-27 MED ORDER — HEPARIN SOD (PORK) LOCK FLUSH 100 UNIT/ML IV SOLN
500.0000 [IU] | Freq: Once | INTRAVENOUS | Status: AC
Start: 2016-01-27 — End: 2016-01-27
  Administered 2016-01-27: 250 [IU] via INTRAVENOUS
  Filled 2016-01-27: qty 5

## 2016-01-27 NOTE — ED Notes (Signed)
Awake. Verbally responsive. A/O x4. Resp even and unlabored. No audible adventitious breath sounds noted. ABC's intact.  

## 2016-01-27 NOTE — Discharge Instructions (Signed)
Today at home drink plenty of fluids and follow-up this week as scheduled for your procedures

## 2016-01-27 NOTE — ED Notes (Signed)
Pt arrived via EMS with report of syncopal episode lasting less than 1 min, dizziness, n/v x1, denies headache and hitting head s/p to sitting on porch for approx 2 hrs. Pt reported symptoms have resolved. Noted residual rt side deficit and slurred speech from old CVA. Pt received last chemo tx on Saturday.

## 2016-01-27 NOTE — ED Notes (Signed)
Pt ambulated to BR with stand by assist.

## 2016-01-27 NOTE — Progress Notes (Signed)
SYMPTOM MANAGEMENT CLINIC   HPI: Brenda Cunningham 65 y.o. female diagnosed with bilateral ovarian cancer.  Currently undergoing carboplatin/Taxol chemotherapy regimen. Patient has a right upper arm PIC line in place.  She has noticed some darkened areas surrounding the PICC insertion site.  She denies any known injury or trauma to the site.  She denies any itching to the site.  She denies any recent fevers or chills.  Patient continues to take aloe quests for treatment of her atrial fib.  Exam today reveal what appears to be some tiny, resolving hematomas scattered around her hematoma site.  They are very minimal and appeared to be nontender.  There is no obvious cellulitis, hives, or rash noted.  Of note-patient is scheduled for Port-A-Cath to be placed on Wednesday, 01/29/2016.  Patient has been instructed to hold her elbow quests altogether for 01/28/2016 and hold the morning dose of her elbow quests on 01/29/2016.  Patient has been instructed to return to the [facility] to have her PICC line discontinued on the afternoon of 01/29/2016.   HPI  ROS  Past Medical History  Diagnosis Date  . Hypertension   . Arthritis   . A-fib ([place])   . Hyperlipidemia   . Stroke ([place]) 2010  . Breast cancer ([place]) 2008    s/p lumpectomy, chemo and radiation therapy  . Wears glasses   . Anxiety     Past Surgical History  Procedure Laterality Date  . Breast lumpectomy  2008    left  . Appendectomy    . Tubal ligation    . Colonoscopy  2004    has Essential hypertension, benign; Atrial fibrillation, unspecified; Anxiety state, unspecified; Carcinomatosis ([place]); Inguinal adenopathy; Ovarian cancer, bilateral ([place]); CVA (cerebral vascular accident) ([place]); Right hemiparesis ([place]); Expressive aphasia; History of left breast cancer; H/O tubal ligation; Colon polyps; [employer] (FIGO) stage IVB epithelial ovarian cancer ([place]); Chronic anticoagulation;  Chronic atrial fibrillation (HCC); PICC (peripherally inserted central catheter) in place; Chemotherapy induced neutropenia ([place]); Poor venous access; and Hematoma on her problem list.    is allergic to codeine.    Medication List       This list is accurate as of: 01/24/16 11:59 PM.  Always use your most recent med list.               ALPRAZolam 0.25 MG tablet  Commonly known as:  XANAX  TK 1 T PO  BID PRA     apixaban 5 MG Tabs tablet  Commonly known as:  ELIQUIS  Take 1 tablet (5 mg total) by mouth 2 (two) times daily.     aspirin EC 81 MG tablet  Take 81 mg by mouth daily.     atorvastatin 40 MG tablet  Commonly known as:  LIPITOR  Take 40 mg by mouth at bedtime.     dexamethasone 4 MG tablet  Commonly known as:  DECADRON  Take 5 tablets =20 mg with food 12 hrs before Taxol Chemotherapy     lidocaine-prilocaine cream  Commonly known as:  EMLA  Apply to Porta-Cath 1-2 hours prior to access as directed.     LORazepam 0.5 MG tablet  Commonly known as:  ATIVAN  Place 1 tablet under the tongue or swallow every 6 hours as needed for nausea. Will make you drowsy.     metoprolol 50 MG tablet  Commonly known as:  LOPRESSOR  Take 50 mg by mouth 2 (two) times daily.  multivitamin with minerals tablet  Take 1 tablet by mouth daily.     omega-3 acid ethyl esters 1 g capsule  Commonly known as:  LOVAZA  TK 1 C PO D     ondansetron 8 MG tablet  Commonly known as:  ZOFRAN  Take 1 tablet (8 mg total) by mouth every 8 (eight) hours as needed for nausea or vomiting (Will not make drowsy.).     PARoxetine 20 MG tablet  Commonly known as:  PAXIL  TAKE 1 TABLET BY MOUTH DAILY     prochlorperazine 10 MG tablet  Commonly known as:  COMPAZINE  Take 1 tablet (10 mg total) by mouth every 6 (six) hours as needed for nausea or vomiting.     verapamil 240 MG CR tablet  Commonly known as:  CALAN-SR  Take 1 tablet (240 mg total) by mouth daily.         PHYSICAL  EXAMINATION  Oncology Vitals 01/25/2016 01/24/2016  Height 174 cm -  Weight - -  Weight (lbs) - -  BMI (kg/m2) - -  Temp 98 97.8  Pulse 82 81  Resp 17 19  SpO2 100 100  BSA (m2) - -   BP Readings from Last 2 Encounters:  01/25/16 124/69  01/24/16 126/64    Physical Exam  Constitutional: She is oriented to person, place, and time and well-developed, well-nourished, and in no distress.  HENT:  Head: Normocephalic and atraumatic.  Eyes: Conjunctivae and EOM are normal. Pupils are equal, round, and reactive to light. Right eye exhibits no discharge. Left eye exhibits no discharge. No scleral icterus.  Neck: Normal range of motion.  Pulmonary/Chest: Effort normal. No respiratory distress.  Neurological: She is alert and oriented to person, place, and time.  Patient has residual right sided weakness and speech difficulty.  Status post CVA as baseline.  Skin: Skin is warm and dry. No rash noted. No erythema. No pallor.  Exam today reveal what appears to be some tiny, resolving hematomas scattered around her hematoma site.  They are very minimal and appeared to be nontender.  There is no obvious cellulitis, hives, or rash noted.    Psychiatric: Affect normal.  Nursing note and vitals reviewed.   LABORATORY DATA:. Appointment on 01/24/2016  Component Date Value Ref Range Status  . WBC 01/24/2016 1.9* 3.9 - 10.3 10e3/uL Final  . NEUT# 01/24/2016 1.4* 1.5 - 6.5 10e3/uL Final  . HGB 01/24/2016 10.6* 11.6 - 15.9 g/dL Final  . HCT 01/24/2016 31.2* 34.8 - 46.6 % Final  . Platelets 01/24/2016 232  145 - 400 10e3/uL Final  . MCV 01/24/2016 92.0  79.5 - 101.0 fL Final  . MCH 01/24/2016 31.3  25.1 - 34.0 pg Final  . MCHC 01/24/2016 34.0  31.5 - 36.0 g/dL Final  . RBC 01/24/2016 3.39* 3.70 - 5.45 10e6/uL Final  . RDW 01/24/2016 16.1* 11.2 - 14.5 % Final  . lymph# 01/24/2016 0.5* 0.9 - 3.3 10e3/uL Final  . MONO# 01/24/2016 0.0* 0.1 - 0.9 10e3/uL Final  . Eosinophils Absolute 01/24/2016  0.0  0.0 - 0.5 10e3/uL Final  . Basophils Absolute 01/24/2016 0.0  0.0 - 0.1 10e3/uL Final  . NEUT% 01/24/2016 71.8  38.4 - 76.8 % Final  . LYMPH% 01/24/2016 27.7  14.0 - 49.7 % Final  . MONO% 01/24/2016 0.5  0.0 - 14.0 % Final  . EOS% 01/24/2016 0.0  0.0 - 7.0 % Final  . BASO% 01/24/2016 0.0  0.0 - 2.0 % Final  .  Sodium 01/24/2016 138  136 - 145 mEq/L Final  . Potassium 01/24/2016 4.0  3.5 - 5.1 mEq/L Final  . Chloride 01/24/2016 105  98 - 109 mEq/L Final  . CO2 01/24/2016 23  22 - 29 mEq/L Final  . Glucose 01/24/2016 176* 70 - 140 mg/dl Final   Glucose reference range is for nonfasting patients. Fasting glucose reference range is 70- 100.  Marland Kitchen BUN 01/24/2016 19.5  7.0 - 26.0 mg/dL Final  . Creatinine 01/24/2016 0.8  0.6 - 1.1 mg/dL Final  . Total Bilirubin 01/24/2016 1.44* 0.20 - 1.20 mg/dL Final  . Alkaline Phosphatase 01/24/2016 129  40 - 150 U/L Final  . AST 01/24/2016 17  5 - 34 U/L Final  . ALT 01/24/2016 37  0 - 55 U/L Final  . Total Protein 01/24/2016 8.0  6.4 - 8.3 g/dL Final  . Albumin 01/24/2016 3.4* 3.5 - 5.0 g/dL Final  . Calcium 01/24/2016 9.8  8.4 - 10.4 mg/dL Final  . Anion Gap 01/24/2016 9  3 - 11 mEq/L Final  . EGFR 01/24/2016 87* >90 ml/min/1.73 m2 Final   eGFR is calculated using the CKD-EPI Creatinine Equation (2009)       RADIOGRAPHIC STUDIES: No results found.  ASSESSMENT/PLAN:    Ovarian cancer, bilateral [facility]) Patient presented to the [facility] today to receive cycle 2 of her carboplatin/Taxol chemotherapy regimen.  Blood counts obtained today revealed a WBC of 1.9, ANC 1.4, hemoglobin 10.6, platelet count 232.  Reviewed all blood counts with Dr. Marko Plume in detail; the decision was made to hold the carboplatin portion of her chemotherapy today.  Patient will proceed with the Taxol chemotherapy today.  Patient is scheduled to return tomorrow, 01/25/2016 for a Neupogen injection.  Patient is also scheduled to return on Monday, 01/27/2016 for labs  and a questionable repeat Neupogen injection.    Hematoma Patient has a right upper arm PIC line in place.  She has noticed some darkened areas surrounding the PICC insertion site.  She denies any known injury or trauma to the site.  She denies any itching to the site.  She denies any recent fevers or chills.  Patient continues to take aloe quests for treatment of her atrial fib.  Exam today reveal what appears to be some tiny, resolving hematomas scattered around her hematoma site.  They are very minimal and appeared to be nontender.  There is no obvious cellulitis, hives, or rash noted.  Of note-patient is scheduled for Port-A-Cath to be placed on Wednesday, 01/29/2016.  Patient has been instructed to hold her elbow quests altogether for 01/28/2016 and hold the morning dose of her elbow quests on 01/29/2016.  Patient has been instructed to return to the [facility] to have her PICC line discontinued on the afternoon of 01/29/2016.  Patient stated understanding of all instructions; and was in agreement with this plan of care. The patient knows to call the clinic with any problems, questions or concerns.   Review/collaboration with Dr. Marko Plume regarding all aspects of patient's visit today.   Total time spent with patient was 25 minutes;  with greater than 75 percent of that time spent in face to face counseling regarding patient's symptoms,  and coordination of care and follow up.  Disclaimer:This dictation was prepared with Dragon/digital dictation along with [employer]. Any transcriptional errors that result from this process are unintentional.  Drue Second, NP 01/27/2016

## 2016-01-27 NOTE — Assessment & Plan Note (Signed)
Patient has a right upper arm PIC line in place.  She has noticed some darkened areas surrounding the PICC insertion site.  She denies any known injury or trauma to the site.  She denies any itching to the site.  She denies any recent fevers or chills.  Patient continues to take aloe quests for treatment of her atrial fib.  Exam today reveal what appears to be some tiny, resolving hematomas scattered around her hematoma site.  They are very minimal and appeared to be nontender.  There is no obvious cellulitis, hives, or rash noted.  Of note-patient is scheduled for Port-A-Cath to be placed on Wednesday, 01/29/2016.  Patient has been instructed to hold her elbow quests altogether for 01/28/2016 and hold the morning dose of her elbow quests on 01/29/2016.  Patient has been instructed to return to the [facility] to have her PICC line discontinued on the afternoon of 01/29/2016.

## 2016-01-27 NOTE — ED Provider Notes (Signed)
CSN: [id number]     Arrival date & time 01/27/16  1444 History   First MD Initiated Contact with Patient 01/27/16 1510     Chief Complaint  Patient presents with  . Loss of Consciousness     (Consider location/radiation/quality/duration/timing/severity/associated sxs/prior Treatment) Patient is a 65 y.o. female presenting with syncope. The history is provided by the patient and a relative (Patient was seen at the cancer center today had labs that were unremarkable. She was scheduled for a central line in 2 days. While she was at home she was outside for an hour and a half awake and dizzy and according to her daughter passed out for very shor).  Loss of Consciousness Episode history:  Single Most recent episode:  Today Timing:  Rare Progression:  Resolved Chronicity:  New Context: not blood draw   Associated symptoms: dizziness   Associated symptoms: no chest pain, no headaches and no seizures     Past Medical History  Diagnosis Date  . Hypertension   . Arthritis   . A-fib ([place])   . Hyperlipidemia   . Stroke ([place]) 2010  . Breast cancer ([place]) 2008    s/p lumpectomy, chemo and radiation therapy  . Wears glasses   . Anxiety    Past Surgical History  Procedure Laterality Date  . Breast lumpectomy  2008    left  . Appendectomy    . Tubal ligation    . Colonoscopy  2004   Family History  Problem Relation Age of Onset  . Family history unknown: Yes   Social History  Substance Use Topics  . Smoking status: Never Smoker   . Smokeless tobacco: None  . Alcohol Use: 1.2 oz/week    1 Glasses of wine, 1 Cans of beer per week   OB History    No data available     Review of Systems  Constitutional: Negative for appetite change and fatigue.  HENT: Negative for congestion, ear discharge and sinus pressure.   Eyes: Negative for discharge.  Respiratory: Negative for cough.   Cardiovascular: Positive for syncope. Negative for chest pain.  Gastrointestinal: Negative for  abdominal pain and diarrhea.  Genitourinary: Negative for frequency and hematuria.  Musculoskeletal: Negative for back pain.  Skin: Negative for rash.  Neurological: Positive for dizziness. Negative for seizures and headaches.  Psychiatric/Behavioral: Negative for hallucinations.      Allergies  Codeine  Home Medications   Prior to Admission medications   Medication Sig Start Date End Date Taking? Authorizing Provider  ALPRAZolam (XANAX) 0.25 MG tablet TK 1 T PO  BID PRA 12/03/15  Yes Historical Provider, MD  apixaban (ELIQUIS) 5 MG TABS tablet Take 1 tablet (5 mg total) by mouth 2 (two) times daily. 09/11/15  Yes Camelia Eng Tysinger, PA-C  aspirin EC 81 MG tablet Take 81 mg by mouth daily.   Yes Historical Provider, MD  atorvastatin (LIPITOR) 40 MG tablet Take 40 mg by mouth at bedtime.   Yes Historical Provider, MD  LORazepam (ATIVAN) 0.5 MG tablet Place 1 tablet under the tongue or swallow every 6 hours as needed for nausea. Will make you drowsy. 12/24/15  Yes Lennis Marion Downer, MD  metoprolol (LOPRESSOR) 50 MG tablet Take 50 mg by mouth 2 (two) times daily.   Yes Historical Provider, MD  Multiple Vitamins-Minerals (MULTIVITAMIN WITH MINERALS) tablet Take 1 tablet by mouth daily.   Yes Historical Provider, MD  omega-3 acid ethyl esters (LOVAZA) 1 g capsule TK 1 C PO D 09/19/15  Yes Historical Provider, MD  PARoxetine (PAXIL) 20 MG tablet TAKE 1 TABLET BY MOUTH DAILY 06/03/15  Yes Camelia Eng Tysinger, PA-C  verapamil (CALAN-SR) 240 MG CR tablet Take 1 tablet (240 mg total) by mouth daily. 07/25/15  Yes Camelia Eng Tysinger, PA-C  dexamethasone (DECADRON) 4 MG tablet Take 5 tablets =20 mg with food 12 hrs before Taxol Chemotherapy Patient not taking: Reported on 01/02/2016 01/02/16   Gordy Levan, MD  lidocaine-prilocaine (EMLA) cream Apply to Porta-Cath 1-2 hours prior to access as directed. Patient not taking: Reported on 01/02/2016 12/31/15   Lennis P Marko Plume, MD  ondansetron (ZOFRAN) 8 MG tablet  Take 1 tablet (8 mg total) by mouth every 8 (eight) hours as needed for nausea or vomiting (Will not make drowsy.). Patient not taking: Reported on 01/20/2016 12/24/15   Gordy Levan, MD  prochlorperazine (COMPAZINE) 10 MG tablet Take 1 tablet (10 mg total) by mouth every 6 (six) hours as needed for nausea or vomiting. Patient not taking: Reported on 01/27/2016 12/26/15   Gordy Levan, MD   SpO2 100% Physical Exam  Constitutional: She is oriented to person, place, and time. She appears well-developed.  HENT:  Head: Normocephalic.  Eyes: Conjunctivae and EOM are normal. No scleral icterus.  Neck: Neck supple. No thyromegaly present.  Cardiovascular: Normal rate and regular rhythm.  Exam reveals no gallop and no friction rub.   No murmur heard. Pulmonary/Chest: No stridor. She has no wheezes. She has no rales. She exhibits no tenderness.  Abdominal: She exhibits no distension. There is no tenderness. There is no rebound.  Musculoskeletal: Normal range of motion. She exhibits tenderness. She exhibits no edema.  Swelling to her right arm this is not new.  Lymphadenopathy:    She has no cervical adenopathy.  Neurological: She is oriented to person, place, and time. She exhibits normal muscle tone. Coordination normal.  Skin: No rash noted. No erythema.  Psychiatric: She has a normal mood and affect. Her behavior is normal.    ED Course  Procedures (including critical care time) Labs Review Labs Reviewed  CBG MONITORING, ED  CBG MONITORING, ED    Imaging Review Ct Head Wo Contrast  01/27/2016  CLINICAL DATA:  Syncopal episode. Pt was awake but wasn't responding per pt's daughter Breast cancer survivor but is undergoing chemo for ovarian ca. Hx stroke. "Pt arrived via EMS with report of syncopal episode lasting less than 1 min, diz.*comment was truncated* EXAM: CT HEAD WITHOUT CONTRAST TECHNIQUE: Contiguous axial images were obtained from the base of the skull through the vertex  without intravenous contrast. COMPARISON:  07/29/2015 FINDINGS: Brain: Moderately large region of Hypoattenuation in the left frontal lobe and anterior temporal lobe, MCA distribution, new since prior exam. There is some ex vacuo dilatation of the frontal horn left lateral ventricle. No midline shift. No acute intracranial hemorrhage. Bilateral basal ganglia mineralization. Acute infarct may be inapparent on noncontrast CT. Vascular: No hyperdense vessel or unexpected calcification. Skull: Negative for fracture or focal lesion. Sinuses/Orbits: No acute findings. Other: None. IMPRESSION: 1. Interval Left MCA distribution infarct and encephalomalacia, no hemorrhage or other acute/superimposed abnormality. Electronically Signed   By: Lucrezia Europe M.D.   On: 01/27/2016 15:44   I have personally reviewed and evaluated these images and lab results as part of my medical decision-making.   EKG Interpretation   Date/Time:  Monday January 27 2016 15:16:06 EST Ventricular Rate:  78 PR Interval:    QRS Duration: 87 QT Interval:  401 QTC Calculation: 457 R Axis:   52 Text Interpretation:  Atrial fibrillation Nonspecific T abnormalities,  diffuse leads Confirmed by Terran Hollenkamp  MD, Teghan Philbin [phone]) on 01/27/2016 4:40:06  PM      MDM   Final diagnoses:  Syncope, near    Patient had a CT scan with an old stroke. EKG unremarkable CBC and cmet done at office unremarkable.  Patient had syncopal episode feeling fine now she will go home and return for her procedure in a couple days.    Milton Ferguson, MD 01/27/16 [phone]

## 2016-01-27 NOTE — Assessment & Plan Note (Signed)
Patient presented to the [facility] today to receive cycle 2 of her carboplatin/Taxol chemotherapy regimen.  Blood counts obtained today revealed a WBC of 1.9, ANC 1.4, hemoglobin 10.6, platelet count 232.  Reviewed all blood counts with Dr. Marko Plume in detail; the decision was made to hold the carboplatin portion of her chemotherapy today.  Patient will proceed with the Taxol chemotherapy today.  Patient is scheduled to return tomorrow, 01/25/2016 for a Neupogen injection.  Patient is also scheduled to return on Monday, 01/27/2016 for labs and a questionable repeat Neupogen injection.

## 2016-01-28 ENCOUNTER — Other Ambulatory Visit: Payer: Self-pay | Admitting: General Surgery

## 2016-01-28 ENCOUNTER — Other Ambulatory Visit: Payer: Self-pay | Admitting: Oncology

## 2016-01-28 DIAGNOSIS — R17 Unspecified jaundice: Secondary | ICD-10-CM

## 2016-01-28 DIAGNOSIS — C800 Disseminated malignant neoplasm, unspecified: Secondary | ICD-10-CM

## 2016-01-29 ENCOUNTER — Ambulatory Visit (HOSPITAL_COMMUNITY)
Admission: RE | Admit: 2016-01-29 | Discharge: 2016-01-29 | Disposition: A | Discharge status: Home / Self Care | Payer: Medicare Other | Source: Ambulatory Visit | Attending: Oncology | Admitting: Oncology

## 2016-01-29 ENCOUNTER — Telehealth: Payer: Self-pay

## 2016-01-29 ENCOUNTER — Encounter (HOSPITAL_COMMUNITY): Payer: Self-pay

## 2016-01-29 ENCOUNTER — Other Ambulatory Visit: Payer: Self-pay | Admitting: Oncology

## 2016-01-29 DIAGNOSIS — R17 Unspecified jaundice: Secondary | ICD-10-CM | POA: Insufficient documentation

## 2016-01-29 DIAGNOSIS — C561 Malignant neoplasm of right ovary: Secondary | ICD-10-CM | POA: Diagnosis not present

## 2016-01-29 DIAGNOSIS — C562 Malignant neoplasm of left ovary: Secondary | ICD-10-CM | POA: Insufficient documentation

## 2016-01-29 DIAGNOSIS — C569 Malignant neoplasm of unspecified ovary: Secondary | ICD-10-CM | POA: Diagnosis not present

## 2016-01-29 DIAGNOSIS — C800 Disseminated malignant neoplasm, unspecified: Secondary | ICD-10-CM

## 2016-01-29 DIAGNOSIS — K769 Liver disease, unspecified: Secondary | ICD-10-CM | POA: Insufficient documentation

## 2016-01-29 DIAGNOSIS — K7689 Other specified diseases of liver: Secondary | ICD-10-CM | POA: Diagnosis not present

## 2016-01-29 DIAGNOSIS — I878 Other specified disorders of veins: Secondary | ICD-10-CM | POA: Diagnosis not present

## 2016-01-29 DIAGNOSIS — Z452 Encounter for adjustment and management of vascular access device: Secondary | ICD-10-CM | POA: Insufficient documentation

## 2016-01-29 DIAGNOSIS — C563 Malignant neoplasm of bilateral ovaries: Secondary | ICD-10-CM

## 2016-01-29 LAB — COMPREHENSIVE METABOLIC PANEL
ALT: 43 U/L (ref 14–54)
AST: 21 U/L (ref 15–41)
Albumin: 3.3 g/dL — ABNORMAL LOW (ref 3.5–5.0)
Alkaline Phosphatase: 109 U/L (ref 38–126)
Anion gap: 8 (ref 5–15)
BUN: 16 mg/dL (ref 6–20)
CO2: 25 mmol/L (ref 22–32)
Calcium: 9.3 mg/dL (ref 8.9–10.3)
Chloride: 105 mmol/L (ref 101–111)
Creatinine, Ser: 0.66 mg/dL (ref 0.44–1.00)
GFR calc Af Amer: >60 mL/min (ref >60)
GFR calc non Af Amer: >60 mL/min (ref >60)
Glucose, Bld: 92 mg/dL (ref 65–99)
Potassium: 3.9 mmol/L (ref 3.5–5.1)
Sodium: 138 mmol/L (ref 135–145)
Total Bilirubin: 1.1 mg/dL (ref 0.3–1.2)
Total Protein: 6.9 g/dL (ref 6.5–8.1)

## 2016-01-29 LAB — CBC
HCT: 30.5 % — ABNORMAL LOW (ref 36.0–46.0)
Hemoglobin: 10.1 g/dL — ABNORMAL LOW (ref 12.0–15.0)
MCH: 30.5 pg (ref 26.0–34.0)
MCHC: 33.1 g/dL (ref 30.0–36.0)
MCV: 92.1 fL (ref 78.0–100.0)
Platelets: 193 10*3/uL (ref 150–400)
RBC: 3.31 MIL/uL — ABNORMAL LOW (ref 3.87–5.11)
RDW: 16.5 % — ABNORMAL HIGH (ref 11.5–15.5)
WBC: 3.6 10*3/uL — ABNORMAL LOW (ref 4.0–10.5)

## 2016-01-29 LAB — PROTIME-INR
INR: 1.3 (ref 0.00–1.49)
Prothrombin Time: 15.8 seconds — ABNORMAL HIGH (ref 11.6–15.2)

## 2016-01-29 LAB — APTT: aPTT: 28 seconds (ref 24–37)

## 2016-01-29 MED ORDER — CEFAZOLIN SODIUM-DEXTROSE 2-3 GM-% IV SOLR
INTRAVENOUS | Status: AC
  Administered 2016-01-29: 2 g via INTRAVENOUS
  Filled 2016-01-29: qty 50

## 2016-01-29 MED ORDER — SODIUM CHLORIDE 0.9% FLUSH
10.0000 mL | INTRAVENOUS | Status: DC | PRN
  Administered 2016-01-29: 10 mL via INTRAVENOUS
  Filled 2016-01-29: qty 10

## 2016-01-29 MED ORDER — MIDAZOLAM HCL 2 MG/2ML IJ SOLN
INTRAMUSCULAR | Status: AC | PRN
  Administered 2016-01-29: 0.5 mg via INTRAVENOUS
  Administered 2016-01-29: 1 mg via INTRAVENOUS
  Administered 2016-01-29: 0.5 mg via INTRAVENOUS

## 2016-01-29 MED ORDER — SODIUM CHLORIDE 0.9 % IV SOLN
INTRAVENOUS | Status: DC
  Administered 2016-01-29: 12:00:00 via INTRAVENOUS

## 2016-01-29 MED ORDER — LIDOCAINE HCL 1 % IJ SOLN
INTRAMUSCULAR | Status: AC
  Filled 2016-01-29: qty 20

## 2016-01-29 MED ORDER — MIDAZOLAM HCL 2 MG/2ML IJ SOLN
INTRAMUSCULAR | Status: AC
  Filled 2016-01-29: qty 6

## 2016-01-29 MED ORDER — CEFAZOLIN SODIUM-DEXTROSE 2-3 GM-% IV SOLR
2.0000 g | INTRAVENOUS | Status: AC
  Administered 2016-01-29: 2 g via INTRAVENOUS

## 2016-01-29 MED ORDER — HEPARIN SOD (PORK) LOCK FLUSH 100 UNIT/ML IV SOLN
INTRAVENOUS | Status: AC | PRN
  Administered 2016-01-29: 500 [IU] via INTRAVENOUS

## 2016-01-29 MED ORDER — LIDOCAINE-EPINEPHRINE 2 %-1:100000 IJ SOLN
INTRAMUSCULAR | Status: DC
Start: 2016-01-29 — End: 2016-01-30
  Filled 2016-01-29: qty 1

## 2016-01-29 MED ORDER — FENTANYL CITRATE (PF) 100 MCG/2ML IJ SOLN
INTRAMUSCULAR | Status: AC | PRN
  Administered 2016-01-29: 50 ug via INTRAVENOUS

## 2016-01-29 MED ORDER — FENTANYL CITRATE (PF) 100 MCG/2ML IJ SOLN
INTRAMUSCULAR | Status: AC
  Filled 2016-01-29: qty 4

## 2016-01-29 MED ORDER — HEPARIN SOD (PORK) LOCK FLUSH 100 UNIT/ML IV SOLN
INTRAVENOUS | Status: AC
  Filled 2016-01-29: qty 5

## 2016-01-29 NOTE — Sedation Documentation (Signed)
Patient denies pain and is resting comfortably.  

## 2016-01-29 NOTE — Procedures (Signed)
RIJV PAC SVC RA No comp/EBL 

## 2016-01-29 NOTE — Telephone Encounter (Signed)
-----   Message from Gordy Levan, MD sent at 01/28/2016  9:54 PM EST ----- Rising bilirubin on taxol  Needs abdominal US. I put in order now hoping to get it in next 1-2 days. May need to delay chemo this week if unable to get [place] prior  I believe she is for [facility] 2-22

## 2016-01-29 NOTE — Discharge Instructions (Signed)
Implanted Port Insertion, Care After Refer to this sheet in the next few weeks. These instructions provide you with information on caring for yourself after your procedure. Your health care provider may also give you more specific instructions. Your treatment has been planned according to current medical practices, but problems sometimes occur. Call your health care provider if you have any problems or questions after your procedure. WHAT TO EXPECT AFTER THE PROCEDURE After your procedure, it is typical to have the following:   Discomfort at the port insertion site. Ice packs to the area will help.  Bruising on the skin over the port. This will subside in 3-4 days. HOME CARE INSTRUCTIONS  After your port is placed, you will get a manufacturer's information card. The card has information about your port. Keep this card with you at all times.   Know what kind of port you have. There are many types of ports available.   Wear a medical alert bracelet in case of an emergency. This can help alert health care workers that you have a port.   The port can stay in for as long as your health care provider believes it is necessary.   A home health care nurse may give medicines and take care of the port.   You or a family member can get special training and directions for giving medicine and taking care of the port at home.  SEEK MEDICAL CARE IF:   Your port does not flush or you are unable to get a blood return.   You have a fever or chills. SEEK IMMEDIATE MEDICAL CARE IF:  You have new fluid or pus coming from your incision.   You notice a bad smell coming from your incision site.   You have swelling, pain, or more redness at the incision or port site.           You have chest pain or shortness of breath.  Call 911. This information is not intended to replace advice given to you by your health care provider. Make sure you discuss any questions you have with your health care  provider.   Document Released: 09/13/2013 Document Revised: 11/28/2013 Document Reviewed: 09/13/2013 Elsevier Interactive Patient Education 2016 [employer] An implanted port is a type of central line that is placed under the skin. Central lines are used to provide IV access when treatment or nutrition needs to be given through a person's veins. Implanted ports are used for long-term IV access. An implanted port may be placed because:   You need IV medicine that would be irritating to the small veins in your hands or arms.   You need long-term IV medicines, such as antibiotics.   You need IV nutrition for a long period.   You need frequent blood draws for lab tests.   You need dialysis.  Implanted ports are usually placed in the chest area, but they can also be placed in the upper arm, the abdomen, or the leg. An implanted port has two main parts:   Reservoir. The reservoir is round and will appear as a small, raised area under your skin. The reservoir is the part where a needle is inserted to give medicines or draw blood.   Catheter. The catheter is a thin, flexible tube that extends from the reservoir. The catheter is placed into a large vein. Medicine that is inserted into the reservoir goes into the catheter and then into the vein.  HOW WILL I  CARE FOR MY INCISION SITE? Do not get the incision site wet. Bathe or shower as directed by your health care provider.  HOW IS MY PORT ACCESSED? Special steps must be taken to access the port:   Before the port is accessed, a numbing cream can be placed on the skin. This helps numb the skin over the port site.   Your health care provider uses a sterile technique to access the port.  Your health care provider must put on a mask and sterile gloves.  The skin over your port is cleaned carefully with an antiseptic and allowed to dry.  The port is gently pinched between sterile gloves, and a needle is  inserted into the port.  Only "non-coring" port needles should be used to access the port. Once the port is accessed, a blood return should be checked. This helps ensure that the port is in the vein and is not clogged.   If your port needs to remain accessed for a constant infusion, a clear (transparent) bandage will be placed over the needle site. The bandage and needle will need to be changed every week, or as directed by your health care provider.   Keep the bandage covering the needle clean and dry. Do not get it wet. Follow your health care provider's instructions on how to take a shower or bath while the port is accessed.   If your port does not need to stay accessed, no bandage is needed over the port.  WHAT IS FLUSHING? Flushing helps keep the port from getting clogged. Follow your health care provider's instructions on how and when to flush the port. Ports are usually flushed with saline solution or a medicine called heparin. The need for flushing will depend on how the port is used.   If the port is used for intermittent medicines or blood draws, the port will need to be flushed:   After medicines have been given.   After blood has been drawn.   As part of routine maintenance.   If a constant infusion is running, the port may not need to be flushed.  HOW LONG WILL MY PORT STAY IMPLANTED? The port can stay in for as long as your health care provider thinks it is needed. When it is time for the port to come out, surgery will be done to remove it. The procedure is similar to the one performed when the port was put in.  WHEN SHOULD I SEEK IMMEDIATE MEDICAL CARE? When you have an implanted port, you should seek immediate medical care if:   You notice a bad smell coming from the incision site.   You have swelling, redness, or drainage at the incision site.   You have more swelling or pain at the port site or the surrounding area.   You have a fever that is not  controlled with medicine.   This information is not intended to replace advice given to you by your health care provider. Make sure you discuss any questions you have with your health care provider.   Document Released: 11/23/2005 Document Revised: 09/13/2013 Document Reviewed: 07/31/2013 Elsevier Interactive Patient Education 2016 Elsevier Inc. Moderate Conscious Sedation, Adult Sedation is the use of medicines to promote relaxation and relieve discomfort and anxiety. Moderate conscious sedation is a type of sedation. Under moderate conscious sedation you are less alert than normal but are still able to respond to instructions or stimulation. Moderate conscious sedation is used during short medical and dental procedures. It  is milder than deep sedation or general anesthesia and allows you to return to your regular activities sooner. LET [facility] CARE PROVIDER KNOW ABOUT:   Any allergies you have.  All medicines you are taking, including vitamins, herbs, eye drops, creams, and over-the-counter medicines.  Use of steroids (by mouth or creams).  Previous problems you or members of your family have had with the use of anesthetics.  Any blood disorders you have.  Previous surgeries you have had.  Medical conditions you have.  Possibility of pregnancy, if this applies.  Use of cigarettes, alcohol, or illegal drugs. RISKS AND COMPLICATIONS Generally, this is a safe procedure. However, as with any procedure, problems can occur. Possible problems include:  Oversedation.  Trouble breathing on your own. You may need to have a breathing tube until you are awake and breathing on your own.  Allergic reaction to any of the medicines used for the procedure. BEFORE THE PROCEDURE  You may have blood tests done. These tests can help show how well your kidneys and liver are working. They can also show how well your blood clots.  A physical exam will be done.  Only take medicines as  directed by your health care provider. You may need to stop taking medicines (such as blood thinners, aspirin, or nonsteroidal anti-inflammatory drugs) before the procedure.   Do not eat or drink at least 6 hours before the procedure or as directed by your health care provider.  Arrange for a responsible adult, family member, or friend to take you home after the procedure. He or she should stay with you for at least 24 hours after the procedure, until the medicine has worn off. PROCEDURE   An intravenous (IV) catheter will be inserted into one of your veins. Medicine will be able to flow directly into your body through this catheter. You may be given medicine through this tube to help prevent pain and help you relax.  The medical or dental procedure will be done. AFTER THE PROCEDURE  You will stay in a recovery area until the medicine has worn off. Your blood pressure and pulse will be checked.   Depending on the procedure you had, you may be allowed to go home when you can tolerate liquids and your pain is under control.   This information is not intended to replace advice given to you by your health care provider. Make sure you discuss any questions you have with your health care provider.   Document Released: 08/18/2001 Document Revised: 12/14/2014 Document Reviewed: 07/31/2013 Elsevier Interactive Patient Education 2016 Elsevier Inc.  Moderate Conscious Sedation, Adult, Care After Refer to this sheet in the next few weeks. These instructions provide you with information on caring for yourself after your procedure. Your health care provider may also give you more specific instructions. Your treatment has been planned according to current medical practices, but problems sometimes occur. Call your health care provider if you have any problems or questions after your procedure. WHAT TO EXPECT AFTER THE PROCEDURE  After your procedure:  You may feel sleepy, clumsy, and have poor balance  for several hours.  Vomiting may occur if you eat too soon after the procedure. HOME CARE INSTRUCTIONS  Do not participate in any activities where you could become injured for at least 24 hours. Do not:  Drive.  Swim.  Ride a bicycle.  Operate heavy machinery.  Cook.  Use power tools.  Climb ladders.  Work from a high place.  Do not make  important decisions or sign legal documents until you are improved.  If you vomit, drink water, juice, or soup when you can drink without vomiting. Make sure you have little or no nausea before eating solid foods.  Only take over-the-counter or prescription medicines for pain, discomfort, or fever as directed by your health care provider.  Make sure you and your family fully understand everything about the medicines given to you, including what side effects may occur.  You should not drink alcohol, take sleeping pills, or take medicines that cause drowsiness for at least 24 hours.  If you smoke, do not smoke without supervision.  If you are feeling better, you may resume normal activities 24 hours after you were sedated.  Keep all appointments with your health care provider. SEEK MEDICAL CARE IF:  Your skin is pale or bluish in color.  You continue to feel nauseous or vomit.  Your pain is getting worse and is not helped by medicine.  You have bleeding or swelling.  You are still sleepy or feeling clumsy after 24 hours. SEEK IMMEDIATE MEDICAL CARE IF:  You develop a rash.  You have difficulty breathing.  You develop any type of allergic problem.  You have a fever. MAKE SURE YOU:  Understand these instructions.  Will watch your condition.  Will get help right away if you are not doing well or get worse.   This information is not intended to replace advice given to you by your health care provider. Make sure you discuss any questions you have with your health care provider.   Document Released: 09/13/2013 Document  Revised: 12/14/2014 Document Reviewed: 09/13/2013 Elsevier Interactive Patient Education [employer].

## 2016-01-29 NOTE — H&P (Signed)
Chief Complaint: Patient was seen in consultation today for port placement at the request of Livesay,Lennis P  Referring Physician(s): Caldwell P  Supervising Physician: Marybelle Killings  History of Present Illness: Brenda Cunningham is a 65 y.o. female with ovarian cancer. She had a PICC line placed as she was too neutropenic for a port and has received her first treatments. Her counts are now better and she is scheduled for port placement. She has been feeling well, no new illnesses or recent fevers. She has been approved by her Cardiologist to hold her Eliquis for 24 hours and has done so. Last dose was 2/20.  Past Medical History  Diagnosis Date  . Hypertension   . Arthritis   . A-fib ([place])   . Hyperlipidemia   . Stroke ([place]) 2010  . Breast cancer ([place]) 2008    s/p lumpectomy, chemo and radiation therapy  . Wears glasses   . Anxiety     Past Surgical History  Procedure Laterality Date  . Breast lumpectomy  2008    left  . Appendectomy    . Tubal ligation    . Colonoscopy  2004    Allergies: Codeine  Medications: Prior to Admission medications   Medication Sig Start Date End Date Taking? Authorizing Provider  acetaminophen (TYLENOL) 500 MG tablet Take 500 mg by mouth every 6 (six) hours as needed.   Yes Historical Provider, MD  loratadine (CLARITIN) 10 MG tablet Take 10 mg by mouth daily as needed for allergies.   Yes Historical Provider, MD  LORazepam (ATIVAN) 0.5 MG tablet Place 1 tablet under the tongue or swallow every 6 hours as needed for nausea. Will make you drowsy. 12/24/15  Yes Lennis Marion Downer, MD  ALPRAZolam (XANAX) 0.25 MG tablet TK 1 T PO  BID PRA 12/03/15   Historical Provider, MD  apixaban (ELIQUIS) 5 MG TABS tablet Take 1 tablet (5 mg total) by mouth 2 (two) times daily. 09/11/15   Carlena Hurl, PA-C  aspirin EC 81 MG tablet Take 81 mg by mouth daily.    Historical Provider, MD  atorvastatin (LIPITOR) 40 MG tablet Take 40 mg by mouth at  bedtime.    Historical Provider, MD  dexamethasone (DECADRON) 4 MG tablet Take 5 tablets =20 mg with food 12 hrs before Taxol Chemotherapy Patient not taking: Reported on 01/02/2016 01/02/16   Lennis Marion Downer, MD  lidocaine-prilocaine (EMLA) cream Apply to Porta-Cath 1-2 hours prior to access as directed. Patient not taking: Reported on 01/02/2016 12/31/15   Gordy Levan, MD  metoprolol (LOPRESSOR) 50 MG tablet Take 50 mg by mouth 2 (two) times daily.    Historical Provider, MD  Multiple Vitamins-Minerals (MULTIVITAMIN WITH MINERALS) tablet Take 1 tablet by mouth daily.    Historical Provider, MD  omega-3 acid ethyl esters (LOVAZA) 1 g capsule TK 1 C PO D 09/19/15   Historical Provider, MD  ondansetron (ZOFRAN) 8 MG tablet Take 1 tablet (8 mg total) by mouth every 8 (eight) hours as needed for nausea or vomiting (Will not make drowsy.). Patient not taking: Reported on 01/20/2016 12/24/15   Gordy Levan, MD  PARoxetine (PAXIL) 20 MG tablet TAKE 1 TABLET BY MOUTH DAILY 06/03/15   Camelia Eng Tysinger, PA-C  prochlorperazine (COMPAZINE) 10 MG tablet Take 1 tablet (10 mg total) by mouth every 6 (six) hours as needed for nausea or vomiting. Patient not taking: Reported on 01/27/2016 12/26/15   Gordy Levan, MD  verapamil (CALAN-SR) 240 MG  CR tablet Take 1 tablet (240 mg total) by mouth daily. 07/25/15   Carlena Hurl, PA-C     Family History  Problem Relation Age of Onset  . Family history unknown: Yes    Social History   Social History  . Marital Status: Single    Spouse Name: N/A  . Number of Children: N/A  . Years of Education: N/A   Social History Main Topics  . Smoking status: Never Smoker   . Smokeless tobacco: None  . Alcohol Use: 1.2 oz/week    1 Glasses of wine, 1 Cans of beer per week  . Drug Use: No  . Sexual Activity: Not Asked   Other Topics Concern  . None   Social History Narrative   Lives with her female friend, Mina Marble, walks for exercise.  Originally from [place]     Review of Systems: A 12 point ROS discussed and pertinent positives are indicated in the HPI above.  All other systems are negative.  Review of Systems  Vital Signs: BP 107/69 mmHg  Pulse 66  Temp(Src) 98.4 F (36.9 C) (Oral)  Resp 16  SpO2 96%  Physical Exam  Constitutional: She is oriented to person, place, and time. She appears well-developed and well-nourished. No distress.  HENT:  Head: Normocephalic.  Mouth/Throat: Oropharynx is clear and moist.  Neck: Normal range of motion. No JVD present. No tracheal deviation present.  Cardiovascular: Normal rate and normal heart sounds.   Pulmonary/Chest: Effort normal and breath sounds normal. No respiratory distress.  Abdominal: Soft. There is no tenderness.  Musculoskeletal:  ([place] PICC intact, site clean 1+edema of arm  Neurological: She is alert and oriented to person, place, and time.  Skin: Skin is warm and dry. No pallor.    Mallampati Score:  MD Evaluation Airway: WNL Heart: WNL Abdomen: WNL Chest/ Lungs: WNL ASA  Classification: 3 Mallampati/Airway Score: One  Imaging: Ct Head Wo Contrast  01/27/2016  CLINICAL DATA:  Syncopal episode. Pt was awake but wasn't responding per pt's daughter Breast cancer survivor but is undergoing chemo for ovarian ca. Hx stroke. "Pt arrived via EMS with report of syncopal episode lasting less than 1 min, diz.*comment was truncated* EXAM: CT HEAD WITHOUT CONTRAST TECHNIQUE: Contiguous axial images were obtained from the base of the skull through the vertex without intravenous contrast. COMPARISON:  07/29/2015 FINDINGS: Brain: Moderately large region of Hypoattenuation in the left frontal lobe and anterior temporal lobe, MCA distribution, new since prior exam. There is some ex vacuo dilatation of the frontal horn left lateral ventricle. No midline shift. No acute intracranial hemorrhage. Bilateral basal ganglia mineralization. Acute infarct may be inapparent on noncontrast CT.  Vascular: No hyperdense vessel or unexpected calcification. Skull: Negative for fracture or focal lesion. Sinuses/Orbits: No acute findings. Other: None. IMPRESSION: 1. Interval Left MCA distribution infarct and encephalomalacia, no hemorrhage or other acute/superimposed abnormality. Electronically Signed   By: Lucrezia Europe M.D.   On: 01/27/2016 15:44   Ir Fluoro Guide Cv Line Right  01/17/2016  INDICATION: High-grade GYN carcinoma with carcinomatosis. Request PICC line placement for initial chemotherapy treatments. EXAM: RIGHT UPPER EXTREMITY PICC LINE PLACEMENT WITH ULTRASOUND AND FLUOROSCOPIC GUIDANCE MEDICATIONS: None; The antibiotic was administered within an appropriate time interval prior to skin puncture. ANESTHESIA/SEDATION: Moderate Sedation Time:  None The patient was continuously monitored during the procedure by the interventional radiology nurse under my direct supervision. FLUOROSCOPY TIME:  Fluoroscopy Time: 36 seconds COMPLICATIONS: None immediate. PROCEDURE: The patient was advised of  the possible risks and complications and agreed to undergo the procedure. The patient was then brought to the angiographic suite for the procedure. The right arm was prepped with chlorhexidine, draped in the usual sterile fashion using maximum barrier technique (cap and mask, sterile gown, sterile gloves, large sterile sheet, hand hygiene and cutaneous antiseptic). Local anesthesia was attained by infiltration with 1% lidocaine. Ultrasound demonstrated patency of the basilic vein, and this was documented with an image. Under real-time ultrasound guidance, this vein was accessed with a 21 gauge micropuncture needle and image documentation was performed. The needle was exchanged over a guidewire for a peel-away sheath through which a 38 cm 5 [place] dual lumen power injectable PICC was advanced, and positioned with its tip at the lower SVC/right atrial junction. Fluoroscopy during the procedure and fluoro spot  radiograph confirms appropriate catheter position. The catheter was flushed, secured to the skin with Prolene sutures, and covered with a sterile dressing. IMPRESSION: Successful placement of a right arm PICC with sonographic and fluoroscopic guidance. The catheter is ready for use. Read by: Ascencion Dike PA-C Electronically Signed   By: Aletta Edouard M.D.   On: 01/17/2016 11:11   Ir US Guide Vasc Access Right  01/17/2016  INDICATION: High-grade GYN carcinoma with carcinomatosis. Request PICC line placement for initial chemotherapy treatments. EXAM: RIGHT UPPER EXTREMITY PICC LINE PLACEMENT WITH ULTRASOUND AND FLUOROSCOPIC GUIDANCE MEDICATIONS: None; The antibiotic was administered within an appropriate time interval prior to skin puncture. ANESTHESIA/SEDATION: Moderate Sedation Time:  None The patient was continuously monitored during the procedure by the interventional radiology nurse under my direct supervision. FLUOROSCOPY TIME:  Fluoroscopy Time: 36 seconds COMPLICATIONS: None immediate. PROCEDURE: The patient was advised of the possible risks and complications and agreed to undergo the procedure. The patient was then brought to the angiographic suite for the procedure. The right arm was prepped with chlorhexidine, draped in the usual sterile fashion using maximum barrier technique (cap and mask, sterile gown, sterile gloves, large sterile sheet, hand hygiene and cutaneous antiseptic). Local anesthesia was attained by infiltration with 1% lidocaine. Ultrasound demonstrated patency of the basilic vein, and this was documented with an image. Under real-time ultrasound guidance, this vein was accessed with a 21 gauge micropuncture needle and image documentation was performed. The needle was exchanged over a guidewire for a peel-away sheath through which a 38 cm 5 [place] dual lumen power injectable PICC was advanced, and positioned with its tip at the lower SVC/right atrial junction. Fluoroscopy during the  procedure and fluoro spot radiograph confirms appropriate catheter position. The catheter was flushed, secured to the skin with Prolene sutures, and covered with a sterile dressing. IMPRESSION: Successful placement of a right arm PICC with sonographic and fluoroscopic guidance. The catheter is ready for use. Read by: Ascencion Dike PA-C Electronically Signed   By: Aletta Edouard M.D.   On: 01/17/2016 11:11    Labs:  CBC:  Recent Labs  01/17/16 1036 01/24/16 1012 01/27/16 1108 01/29/16 1142  WBC 8.0 1.9* 8.5 3.6*  HGB 10.4* 10.6* 11.3* 10.1*  HCT 30.7* 31.2* 33.2* 30.5*  PLT 241 232 203 193    COAGS:  Recent Labs  07/29/15 2239 12/17/15 1145 01/29/16 1142  INR 1.49 1.40 1.30  APTT 31  --  28    BMP:  Recent Labs  07/29/15 2239 07/29/15 2246 11/28/15 1850 12/17/15 1145  01/09/16 0910 01/17/16 1036 01/24/16 1012 01/27/16 1108  NA 141 144 137 143  < > 141 137 138 140  K 3.8 3.8 4.1 3.8  < > 3.5 3.9 4.0 3.9  CL 106 105 103 107  --   --   --   --   --   CO2 24  --  25 27  < > 25 21* 23 25  GLUCOSE 133* 133* 121* 93  < > 101 179* 176* 92  BUN 13 20 10 7   < > 9.3 11.2 19.5 15.9  CALCIUM 9.5  --  9.7 9.7  < > 9.3 9.8 9.8 9.6  CREATININE 0.90 0.90 0.79 0.82  < > 0.8 0.8 0.8 0.8  GFRNONAA >60  --  >60 >60  --   --   --   --   --   GFRAA >60  --  >60 >60  --   --   --   --   --   < > = values in this interval not displayed.  LIVER FUNCTION TESTS:  Recent Labs  01/09/16 0910 01/17/16 1036 01/24/16 1012 01/27/16 1108  BILITOT 0.84 0.74 1.44* 2.48*  AST 31 17 17 23   ALT 42 22 37 50  ALKPHOS 161* 175* 129 129  PROT 7.8 7.9 8.0 7.9  ALBUMIN 3.2* 3.2* 3.4* 3.4*    Assessment and Plan: Ovarian cancer WBC, neutropenia improved Plan for port placement Labs ok Risks and Benefits discussed with the patient including, but not limited to bleeding, infection, pneumothorax, or fibrin sheath development and need for additional procedures. All of the patient's questions  were answered, patient is agreeable to proceed. Consent signed and in chart. PICC will be removed after procedure  Thank you for this interesting consult.   A copy of this report was sent to the requesting provider on this date.  Electronically Signed: Ascencion Dike 01/29/2016, 1:10 PM   I spent a total of 20 minutes in face to face in clinical consultation, greater than 50% of which was counseling/coordinating care for port placement

## 2016-01-29 NOTE — Telephone Encounter (Signed)
Spoke with Daughter Kenney Houseman and told her that Dr. Marko Plume ordered an [place] of her abdomen as her mother's bilirubin is rising.  It was up to 2.48 on the lab work when she was in the ED on 01-27-16 for the syncopal episode. Kenney Houseman stated that her mother's eyes were " freaky yellow" that day.  She stated that her mother's eyes are fine now.  Results will be call  To daughter after Dr. Marko Plume reviews report.

## 2016-01-30 ENCOUNTER — Telehealth: Payer: Self-pay

## 2016-01-30 ENCOUNTER — Other Ambulatory Visit: Payer: Self-pay | Admitting: Oncology

## 2016-01-30 DIAGNOSIS — C800 Disseminated malignant neoplasm, unspecified: Secondary | ICD-10-CM

## 2016-01-30 NOTE — Telephone Encounter (Signed)
Please let daughter know that abdominal US does not show any blockage of bile ducts or obvious gallstones to cause the fluctuation in bilirubin.  We will recheck chemistries on 2-24 prior to chemo with low dose taxol on 2-24.  Daughter should let us know if she notices eyes yellow between visits.  thanks

## 2016-01-30 NOTE — Telephone Encounter (Signed)
Lavella Lemons, patient's daughter calling to speak with Barbaraann Share in regards to patient's blood test results and Korea results.  Is requesting a call back today.

## 2016-01-30 NOTE — Telephone Encounter (Signed)
Discussed results of Korea of abdomen as noted below by Dr. Marko Plume.  Daughter verbalized understanding.

## 2016-01-31 ENCOUNTER — Ambulatory Visit: Payer: Medicare Other

## 2016-01-31 ENCOUNTER — Other Ambulatory Visit: Payer: Self-pay | Admitting: *Deleted

## 2016-01-31 ENCOUNTER — Ambulatory Visit (HOSPITAL_BASED_OUTPATIENT_CLINIC_OR_DEPARTMENT_OTHER): Payer: Medicare Other

## 2016-01-31 ENCOUNTER — Other Ambulatory Visit (HOSPITAL_BASED_OUTPATIENT_CLINIC_OR_DEPARTMENT_OTHER): Payer: Medicare Other

## 2016-01-31 VITALS — BP 131/70 | HR 60 | Temp 98.6°F | Resp 16

## 2016-01-31 DIAGNOSIS — C569 Malignant neoplasm of unspecified ovary: Secondary | ICD-10-CM

## 2016-01-31 DIAGNOSIS — C561 Malignant neoplasm of right ovary: Secondary | ICD-10-CM

## 2016-01-31 DIAGNOSIS — C562 Malignant neoplasm of left ovary: Secondary | ICD-10-CM | POA: Diagnosis not present

## 2016-01-31 DIAGNOSIS — C563 Malignant neoplasm of bilateral ovaries: Secondary | ICD-10-CM

## 2016-01-31 DIAGNOSIS — Z5111 Encounter for antineoplastic chemotherapy: Secondary | ICD-10-CM | POA: Diagnosis present

## 2016-01-31 DIAGNOSIS — C800 Disseminated malignant neoplasm, unspecified: Secondary | ICD-10-CM

## 2016-01-31 LAB — COMPREHENSIVE METABOLIC PANEL
ALT: 33 U/L (ref 0–55)
AST: 14 U/L (ref 5–34)
Albumin: 3.3 g/dL — ABNORMAL LOW (ref 3.5–5.0)
Alkaline Phosphatase: 115 U/L (ref 40–150)
Anion Gap: 10 mEq/L (ref 3–11)
BUN: 13.8 mg/dL (ref 7.0–26.0)
CO2: 23 mEq/L (ref 22–29)
Calcium: 9.5 mg/dL (ref 8.4–10.4)
Chloride: 103 mEq/L (ref 98–109)
Creatinine: 0.8 mg/dL (ref 0.6–1.1)
EGFR: >90 mL/min/{1.73_m2} (ref >90)
Glucose: 185 mg/dl — ABNORMAL HIGH (ref 70–140)
Potassium: 3.7 mEq/L (ref 3.5–5.1)
Sodium: 136 mEq/L (ref 136–145)
Total Bilirubin: 0.71 mg/dL (ref 0.20–1.20)
Total Protein: 7.3 g/dL (ref 6.4–8.3)

## 2016-01-31 LAB — CBC WITH DIFFERENTIAL/PLATELET
BASO%: 0 % (ref 0.0–2.0)
Basophils Absolute: 0 10*3/uL (ref 0.0–0.1)
EOS%: 0 % (ref 0.0–7.0)
Eosinophils Absolute: 0 10*3/uL (ref 0.0–0.5)
HCT: 28.7 % — ABNORMAL LOW (ref 34.8–46.6)
HGB: 9.9 g/dL — ABNORMAL LOW (ref 11.6–15.9)
LYMPH%: 30.9 % (ref 14.0–49.7)
MCH: 31.4 pg (ref 25.1–34.0)
MCHC: 34.5 g/dL (ref 31.5–36.0)
MCV: 91.1 fL (ref 79.5–101.0)
MONO#: 0.1 10*3/uL (ref 0.1–0.9)
MONO%: 1.2 % (ref 0.0–14.0)
NEUT#: 2.7 10*3/uL (ref 1.5–6.5)
NEUT%: 67.9 % (ref 38.4–76.8)
Platelets: 204 10*3/uL (ref 145–400)
RBC: 3.15 10*6/uL — ABNORMAL LOW (ref 3.70–5.45)
RDW: 16.8 % — ABNORMAL HIGH (ref 11.2–14.5)
WBC: 4 10*3/uL (ref 3.9–10.3)
lymph#: 1.3 10*3/uL (ref 0.9–3.3)

## 2016-01-31 MED ORDER — HEPARIN SOD (PORK) LOCK FLUSH 100 UNIT/ML IV SOLN
500.0000 [IU] | Freq: Once | INTRAVENOUS | Status: AC
  Administered 2016-01-31: 500 [IU] via INTRAVENOUS
  Filled 2016-01-31: qty 5

## 2016-01-31 MED ORDER — SODIUM CHLORIDE 0.9 % IV SOLN
Freq: Once | INTRAVENOUS | Status: AC
  Administered 2016-01-31: 11:00:00 via INTRAVENOUS

## 2016-01-31 MED ORDER — DEXTROSE 5 % IV SOLN
80.0000 mg/m2 | Freq: Once | INTRAVENOUS | Status: AC
  Administered 2016-01-31: 162 mg via INTRAVENOUS
  Filled 2016-01-31: qty 27

## 2016-01-31 MED ORDER — SODIUM CHLORIDE 0.9 % IV SOLN
20.0000 mg | Freq: Once | INTRAVENOUS | Status: AC
  Administered 2016-01-31: 20 mg via INTRAVENOUS
  Filled 2016-01-31: qty 2

## 2016-01-31 MED ORDER — DIPHENHYDRAMINE HCL 50 MG/ML IJ SOLN
INTRAMUSCULAR | Status: AC
  Filled 2016-01-31: qty 1

## 2016-01-31 MED ORDER — DIPHENHYDRAMINE HCL 50 MG/ML IJ SOLN
25.0000 mg | Freq: Once | INTRAMUSCULAR | Status: AC
  Administered 2016-01-31: 25 mg via INTRAVENOUS

## 2016-01-31 MED ORDER — FAMOTIDINE IN NACL 20-0.9 MG/50ML-% IV SOLN
INTRAVENOUS | Status: AC
  Filled 2016-01-31: qty 50

## 2016-01-31 MED ORDER — FAMOTIDINE IN NACL 20-0.9 MG/50ML-% IV SOLN
20.0000 mg | Freq: Once | INTRAVENOUS | Status: AC
  Administered 2016-01-31: 20 mg via INTRAVENOUS

## 2016-01-31 MED ORDER — SODIUM CHLORIDE 0.9% FLUSH
10.0000 mL | INTRAVENOUS | Status: DC | PRN
  Administered 2016-01-31: 10 mL via INTRAVENOUS
  Filled 2016-01-31: qty 10

## 2016-01-31 NOTE — Patient Instructions (Signed)
[facility] Discharge Instructions for Patients Receiving Chemotherapy  Today you received the following chemotherapy agents taxol  To help prevent nausea and vomiting after your treatment, we encourage you to take your nausea medication as directed   If you develop nausea and vomiting that is not controlled by your nausea medication, call the clinic.   BELOW ARE SYMPTOMS THAT SHOULD BE REPORTED IMMEDIATELY:  *FEVER GREATER THAN 100.5 F  *CHILLS WITH OR WITHOUT FEVER  NAUSEA AND VOMITING THAT IS NOT CONTROLLED WITH YOUR NAUSEA MEDICATION  *UNUSUAL SHORTNESS OF BREATH  *UNUSUAL BRUISING OR BLEEDING  TENDERNESS IN MOUTH AND THROAT WITH OR WITHOUT PRESENCE OF ULCERS  *URINARY PROBLEMS  *BOWEL PROBLEMS  UNUSUAL RASH Items with * indicate a potential emergency and should be followed up as soon as possible.  Feel free to call the clinic you have any questions or concerns. The clinic phone number is [phone].  

## 2016-02-01 ENCOUNTER — Other Ambulatory Visit: Payer: Self-pay | Admitting: Oncology

## 2016-02-01 ENCOUNTER — Ambulatory Visit (HOSPITAL_BASED_OUTPATIENT_CLINIC_OR_DEPARTMENT_OTHER): Payer: Medicare Other

## 2016-02-01 VITALS — BP 142/67 | HR 96 | Temp 97.7°F

## 2016-02-01 DIAGNOSIS — C562 Malignant neoplasm of left ovary: Secondary | ICD-10-CM | POA: Diagnosis not present

## 2016-02-01 DIAGNOSIS — C561 Malignant neoplasm of right ovary: Secondary | ICD-10-CM | POA: Diagnosis present

## 2016-02-01 DIAGNOSIS — C563 Malignant neoplasm of bilateral ovaries: Secondary | ICD-10-CM

## 2016-02-01 DIAGNOSIS — C800 Disseminated malignant neoplasm, unspecified: Secondary | ICD-10-CM

## 2016-02-01 MED ORDER — TBO-FILGRASTIM 480 MCG/0.8ML ~~LOC~~ SOSY
480.0000 ug | PREFILLED_SYRINGE | Freq: Once | SUBCUTANEOUS | Status: AC
  Administered 2016-02-01: 480 ug via SUBCUTANEOUS

## 2016-02-03 ENCOUNTER — Ambulatory Visit (HOSPITAL_BASED_OUTPATIENT_CLINIC_OR_DEPARTMENT_OTHER): Payer: Medicare Other | Admitting: Oncology

## 2016-02-03 ENCOUNTER — Other Ambulatory Visit (HOSPITAL_BASED_OUTPATIENT_CLINIC_OR_DEPARTMENT_OTHER): Payer: Medicare Other

## 2016-02-03 ENCOUNTER — Ambulatory Visit: Payer: Medicare Other

## 2016-02-03 ENCOUNTER — Encounter: Payer: Self-pay | Admitting: Oncology

## 2016-02-03 VITALS — BP 133/75 | HR 80 | Temp 98.2°F | Resp 17 | Ht 68.5 in | Wt 179.6 lb

## 2016-02-03 DIAGNOSIS — K769 Liver disease, unspecified: Secondary | ICD-10-CM

## 2016-02-03 DIAGNOSIS — G622 Polyneuropathy due to other toxic agents: Secondary | ICD-10-CM

## 2016-02-03 DIAGNOSIS — C561 Malignant neoplasm of right ovary: Secondary | ICD-10-CM

## 2016-02-03 DIAGNOSIS — R4701 Aphasia: Secondary | ICD-10-CM

## 2016-02-03 DIAGNOSIS — Z853 Personal history of malignant neoplasm of breast: Secondary | ICD-10-CM

## 2016-02-03 DIAGNOSIS — C563 Malignant neoplasm of bilateral ovaries: Secondary | ICD-10-CM

## 2016-02-03 DIAGNOSIS — T451X5A Adverse effect of antineoplastic and immunosuppressive drugs, initial encounter: Secondary | ICD-10-CM

## 2016-02-03 DIAGNOSIS — R634 Abnormal weight loss: Secondary | ICD-10-CM | POA: Diagnosis not present

## 2016-02-03 DIAGNOSIS — I878 Other specified disorders of veins: Secondary | ICD-10-CM

## 2016-02-03 DIAGNOSIS — G620 Drug-induced polyneuropathy: Secondary | ICD-10-CM

## 2016-02-03 DIAGNOSIS — Z8673 Personal history of transient ischemic attack (TIA), and cerebral infarction without residual deficits: Secondary | ICD-10-CM | POA: Diagnosis not present

## 2016-02-03 DIAGNOSIS — C800 Disseminated malignant neoplasm, unspecified: Secondary | ICD-10-CM

## 2016-02-03 DIAGNOSIS — D701 Agranulocytosis secondary to cancer chemotherapy: Secondary | ICD-10-CM | POA: Diagnosis not present

## 2016-02-03 DIAGNOSIS — C562 Malignant neoplasm of left ovary: Secondary | ICD-10-CM

## 2016-02-03 DIAGNOSIS — Z7901 Long term (current) use of anticoagulants: Secondary | ICD-10-CM

## 2016-02-03 DIAGNOSIS — Z95828 Presence of other vascular implants and grafts: Secondary | ICD-10-CM

## 2016-02-03 DIAGNOSIS — I10 Essential (primary) hypertension: Secondary | ICD-10-CM | POA: Diagnosis not present

## 2016-02-03 DIAGNOSIS — I4891 Unspecified atrial fibrillation: Secondary | ICD-10-CM

## 2016-02-03 DIAGNOSIS — C774 Secondary and unspecified malignant neoplasm of inguinal and lower limb lymph nodes: Secondary | ICD-10-CM

## 2016-02-03 DIAGNOSIS — C569 Malignant neoplasm of unspecified ovary: Secondary | ICD-10-CM

## 2016-02-03 DIAGNOSIS — I482 Chronic atrial fibrillation, unspecified: Secondary | ICD-10-CM

## 2016-02-03 LAB — CBC WITH DIFFERENTIAL/PLATELET
BASO%: 0.8 % (ref 0.0–2.0)
Basophils Absolute: 0.1 10*3/uL (ref 0.0–0.1)
EOS%: 0.1 % (ref 0.0–7.0)
Eosinophils Absolute: 0 10*3/uL (ref 0.0–0.5)
HCT: 35.2 % (ref 34.8–46.6)
HGB: 11.3 g/dL — ABNORMAL LOW (ref 11.6–15.9)
LYMPH%: 15.2 % (ref 14.0–49.7)
MCH: 30.7 pg (ref 25.1–34.0)
MCHC: 32.1 g/dL (ref 31.5–36.0)
MCV: 95.6 fL (ref 79.5–101.0)
MONO#: 0.1 10*3/uL (ref 0.1–0.9)
MONO%: 0.6 % (ref 0.0–14.0)
NEUT#: 13.4 10*3/uL — ABNORMAL HIGH (ref 1.5–6.5)
NEUT%: 83.3 % — ABNORMAL HIGH (ref 38.4–76.8)
Platelets: 212 10*3/uL (ref 145–400)
RBC: 3.68 10*6/uL — ABNORMAL LOW (ref 3.70–5.45)
RDW: 17.5 % — ABNORMAL HIGH (ref 11.2–14.5)
WBC: 16.1 10*3/uL — ABNORMAL HIGH (ref 3.9–10.3)
lymph#: 2.5 10*3/uL (ref 0.9–3.3)

## 2016-02-03 LAB — COMPREHENSIVE METABOLIC PANEL
ALT: 44 U/L (ref 0–55)
AST: 20 U/L (ref 5–34)
Albumin: 3.4 g/dL — ABNORMAL LOW (ref 3.5–5.0)
Alkaline Phosphatase: 129 U/L (ref 40–150)
Anion Gap: 9 mEq/L (ref 3–11)
BUN: 17.6 mg/dL (ref 7.0–26.0)
CO2: 25 mEq/L (ref 22–29)
Calcium: 9.3 mg/dL (ref 8.4–10.4)
Chloride: 105 mEq/L (ref 98–109)
Creatinine: 0.8 mg/dL (ref 0.6–1.1)
EGFR: >90 mL/min/{1.73_m2} (ref >90)
Glucose: 106 mg/dl (ref 70–140)
Potassium: 3.3 mEq/L — ABNORMAL LOW (ref 3.5–5.1)
Sodium: 139 mEq/L (ref 136–145)
Total Bilirubin: 1.2 mg/dL (ref 0.20–1.20)
Total Protein: 7.3 g/dL (ref 6.4–8.3)

## 2016-02-03 MED ORDER — SODIUM CHLORIDE 0.9% FLUSH
10.0000 mL | INTRAVENOUS | Status: DC | PRN
  Administered 2016-02-03: 10 mL via INTRAVENOUS
  Filled 2016-02-03: qty 10

## 2016-02-03 MED ORDER — HEPARIN SOD (PORK) LOCK FLUSH 100 UNIT/ML IV SOLN
500.0000 [IU] | Freq: Once | INTRAVENOUS | Status: AC
  Administered 2016-02-03: 500 [IU] via INTRAVENOUS
  Filled 2016-02-03: qty 5

## 2016-02-03 NOTE — Progress Notes (Signed)
OFFICE PROGRESS NOTE   February 05, 2016   Physicians Everitt Amber, Lendon Collar (PCP), Lindell Spar [phone] neurology [place]), Dr Valetta Fuller (cardiology [place], [phone], new patient apt 01-15-16) Brenda Cunningham (Alapati P. Marcello Moores, PCP [place], [place] [phone]) ( (oncologist Dr Maudie Mercury in [place], [place]; breast radiation given at [facility] in [place]). 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 She is due day 15 cycle 2 dose dense carbo taxol on 02-07-16. She was neutropenic cycle 1,  had granix days 2,3,9 this cycle 2.  Plan is to reimage after 3 cycles, for consideration of interval surgery depending on response.    Patient had PAC placed by IR on 01-29-16, Eliquis held x 1 day per recommendation from her cardiologist. There were no problems with placement, PICC removed that day, PAC functioned well with chemo last week, and she resumed Eliquis as instructed.  She had transient elevation of bilirubin to 1.44 on 2-17 prior to taxol and 2.48 on 2-20, subsequently back to normal. Abdominal US 01-29-16 showed no biliary obstruction, no apparent cholelithiasis, same area at caudate lobe that was seen on prior CT. Daughter had noticed transient icteric sclerae. Etiology for this not clear, T bili down to 0.7 on 2-24 and 1.2 today.  Patient and family report that she is doing well. She denies pain, nausea or abdominal/ pelvic distension or symptoms. Appetite has been better, drinking 2 Ensure in smoothie in AM and 2 other full meals daily. She is not excessively thirsty, but has been drinking fluids. Energy is better. Bowels are moving well daily. She notices slight neuropathy in feet which seems increased above baseline, but is not bothersome;  we have discussed fact that benefit from taxol for the cancer seems to outweigh concerns about minimal neuropathy at this point. She is sleeping well. No SOB. No LE swelling. Bladder ok.  No new or different neurologic symptoms. Remainder of 10 point Review of Systems negative   CA 125 12-24-15 by [employer] ECLIA 608, and on 01-01-16 741 PICC placed RUE by IR 01-17-16 Flu vaccine 10-10-15 No genetics testing known with prior breast cancer   ONCOLOGIC HISTORY  Patient presented to ED in [place] 11-28-15 with LLQ pain and left hip pain, symptomatic for at least a year per family but worse. CT AP 11-28-15 had bilateral ovarian masses up to 4 cm on right, with peritoneal carcinomatosis in pelvis, omentum and perihepatic, with small ascites, bilateral pelvic and inguinal adenopathy, with retroperitoneal adenopathy extending to pelvic brim. 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 Recommendation was to begin treatment with carboplatin taxol, with reevaluation after 3 cycles for possible interval debulking surgery. Day 1 cycle 1 dose dense carbo taxol was given on 12-27-15, then missed day 8 cycle 1 due to lack of IV access. ANC was 0.1 on day 14 (01-09-16) with granix given x3 and day 15 delayed another week. She received day 15 cycle 1 on 01-17-16 after PICC placed that day.  She has history of left breast cancer 2005 treated in [place] with  lumpectomy, chemotherapy and radiation, none of those records presently available.   Objective:  Vital signs in last 24 hours:  BP 133/75 mmHg  Pulse 80  Temp(Src) 98.2 F (36.8 C) (Oral)  Resp 17  Ht 5' 8.5" (1.74 m)  Wt 179 lb 9.6 oz (81.466 kg)  BMI 26.91  kg/m2  SpO2 100% Weight down 7 lbs from 01-20-16: patient and husband feel that abdomen size is close to normal now. Alert, oriented and appropriate. Ambulatory, right hemiplegia. Respirations not labored. No alopecia  HEENT:PERRL, sclerae not icteric. Oral mucosa moist without lesions, posterior pharynx clear.  Neck supple. No JVD.  Lymphatics:no cervical,supraclavicular or inguinal adenopathy Resp: clear to auscultation bilaterally and normal percussion bilaterally Cardio: irregular rate and rhythm. No gallop. GI: soft, nontender, not clearly distended, fullness but no specific mass or organomegaly. Normally active bowel sounds.  Musculoskeletal/ Extremities: without pitting edema, cords, tenderness Neuro: no peripheral neuropathy. Otherwise nonfocal. PSYCH appropriate mood and affect. Skin without rash, ecchymosis, petechiae. Site of previous PICC RUE closed, not tender Portacath-without erythema or tenderness, surgical glue in place, good position.  Lab Results:  Results for orders placed or performed in visit on 02/03/16  CBC with Differential  Result Value Ref Range   WBC 16.1 (H) 3.9 - 10.3 10e3/uL   NEUT# 13.4 (H) 1.5 - 6.5 10e3/uL   HGB 11.3 (L) 11.6 - 15.9 g/dL   HCT 35.2 34.8 - 46.6 %   Platelets 212 145 - 400 10e3/uL   MCV 95.6 79.5 - 101.0 fL   MCH 30.7 25.1 - 34.0 pg   MCHC 32.1 31.5 - 36.0 g/dL   RBC 3.68 (L) 3.70 - 5.45 10e6/uL   RDW 17.5 (H) 11.2 - 14.5 %   lymph# 2.5 0.9 - 3.3 10e3/uL   MONO# 0.1 0.1 - 0.9 10e3/uL   Eosinophils Absolute 0.0 0.0 - 0.5 10e3/uL   Basophils Absolute 0.1 0.0 - 0.1 10e3/uL   NEUT% 83.3 (H) 38.4 - 76.8 %   LYMPH% 15.2 14.0 - 49.7 %   MONO% 0.6 0.0 - 14.0 %   EOS% 0.1 0.0 - 7.0 %   BASO% 0.8 0.0 - 2.0 %  Comprehensive metabolic panel  Result Value Ref Range   Sodium 139 136 - 145 mEq/L   Potassium 3.3 (L) 3.5 - 5.1 mEq/L   Chloride 105 98 - 109 mEq/L   CO2 25 22 - 29 mEq/L   Glucose 106 70 - 140 mg/dl   BUN 17.6 7.0 - 26.0  mg/dL   Creatinine 0.8 0.6 - 1.1 mg/dL   Total Bilirubin 1.20 0.20 - 1.20 mg/dL   Alkaline Phosphatase 129 40 - 150 U/L   AST 20 5 - 34 U/L   ALT 44 0 - 55 U/L   Total Protein 7.3 6.4 - 8.3 g/dL   Albumin 3.4 (L) 3.5 - 5.0 g/dL   Calcium 9.3 8.4 - 10.4 mg/dL   Anion Gap 9 3 - 11 mEq/L   EGFR >90 >90 ml/min/1.73 m2  CA 125  Result Value Ref Range   Cancer Antigen (CA) 125 137.3 (H) 0.0 - 38.1 U/mL    CA 125 available after visit down to 137, compared with 741 on 01-01-16.  Studies/Results:  EXAM: ABDOMEN ULTRASOUND COMPLETE   01-29-16  COMPARISON: CT 11/28/2015.  FINDINGS: Gallbladder: No gallstones or wall thickening visualized. No sonographic Murphy sign noted by sonographer.  Common bile duct: Diameter: 6.7 mm  Liver: Hyperechoic 2.5 x 1.0 x 1.6 cm lesion in the region  of the caudate lobe. Similar finding noted on recent CT. This could represent a process such as hemangioma. A metastatic lesion could also present this fashion. No other abnormalities identified within the liver.  IVC: No abnormality visualized.  Pancreas: Visualized portion unremarkable.  Spleen: Size and appearance within normal limits.  Right Kidney: Length: 11.5 cm. Echogenicity within normal limits. No mass or hydronephrosis visualized.  Left Kidney: Length: 11.0 cm. Echogenicity within normal limits. No mass or hydronephrosis visualized.  Abdominal aorta: No aneurysm visualized.  Other findings: None.  IMPRESSION: 1. Hyperechoic 2.5 x 1.0 x 1.6 cm lesion in the region the caudate lobe of the liver. Similar finding noted on prior CT of 11/28/2015. This could represent hemangioma. This could represent a malignancy including metastatic disease.  2. Exam otherwise unremarkable. No gallstones. No biliary distention.   Medications: I have reviewed the patient's current medications.  DISCUSSION Clinically improving with chemotherapy thus far, and patient/ family are in  agreement with continuing as planned. She will have counts with Day 15 cycle 2  on 3-3 to confirm, but likely will use granix days 2,4 (due to [place] day 3),9 with this dose dense regimen. Discussed good nutrition, encouraged her to continue the 2 Ensure daily.     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 Symptomatically improved, good access now with PAC, CA 125 improving. Continue treatment as planned. Granix. CT and visit back to Dr Denman George ~ late March Needs genetics referral, may wait until after surgery so information re primary for the gyn cancer is known. 2. 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 Hopefully will be able to resume outpatient PT/ OT/ speech therapy in near future. Followed by neurology in [facility]. History of TIAs 2010  3.PAC in by IR 01-29-16; PICC removed. Very difficult peripheral IV access. 4.atrial fibrillation diagnosed ~ 2011, previously on xeloda and now Eliquis + ASA. Now followed by Dr Valetta Fuller [facility], next visit ~ 6 months. 5.Left breast cancer 2005 diagnosed and treated in [place] with lumpectomy and presumably axillary node evaluation, combination chemotherapy (drugs not known) via peripheral veins, local radiation. Up to date on mammograms, done 10-18-15 at [facility]. We will try to get records from prior PCP in [place], as family not able to provide names or contacts of oncologists. 6.hx HTN 7.colon polyps at colonoscopy by Dr Benson Norway 2015. Follow up not specified on the report in EMR 8.post appendectomy, BTL 9.family history of cardiac disease with MIs in sisters ages 9 and 78, and in brother 10.flu vaccine done 10-2015 60.information on Advance Directives given 12.chemo neutropenia with ANC 0.1 on day 14 cycle 1 (despite day  8 held). To receive gCSF from here, monitor counts. 13. Chemo peripheral neuropathy: minimal thus far, exercise and massage feet, shoes with good support, follow. 14.weight loss is probably indicating response to chemo, as po intake seems good.  All questions answered. Chemo and granix orders confirmed. WIll check CBC and CMET on 3-3 for granix and the bilirubin. Time spent 30 min including >50% counseling and coordination of care. Cc Drs Bobbye Morton, Powers    Gordy Levan, MD   02/05/2016, 9:37 AM

## 2016-02-03 NOTE — Patient Instructions (Signed)

## 2016-02-04 ENCOUNTER — Telehealth: Payer: Self-pay

## 2016-02-04 LAB — CA 125: Cancer Antigen (CA) 125: 137.3 U/mL — ABNORMAL HIGH (ref 0.0–38.1)

## 2016-02-04 NOTE — Telephone Encounter (Signed)
lvm with [name] per Dr Edwyna Shell message attached.

## 2016-02-04 NOTE — Telephone Encounter (Signed)
-----   Message from Gordy Levan, MD sent at 02/04/2016  1:29 PM EST ----- Labs seen and need follow up: please let daughter or husband know that CA 125 marker is lower, down to 137 this week as compared with 740 in late Jan, showing good response to chemo thus far.  Tell them that she needs to increase potassium rich foods in diet, as K a little low: bananas, avocado, spinach, sweet potatoes, sports drinks etc.  thanks

## 2016-02-05 ENCOUNTER — Other Ambulatory Visit: Payer: Self-pay | Admitting: Oncology

## 2016-02-05 DIAGNOSIS — G620 Drug-induced polyneuropathy: Secondary | ICD-10-CM | POA: Insufficient documentation

## 2016-02-05 DIAGNOSIS — T451X5A Adverse effect of antineoplastic and immunosuppressive drugs, initial encounter: Secondary | ICD-10-CM

## 2016-02-05 DIAGNOSIS — Z95828 Presence of other vascular implants and grafts: Secondary | ICD-10-CM | POA: Insufficient documentation

## 2016-02-07 ENCOUNTER — Other Ambulatory Visit (HOSPITAL_BASED_OUTPATIENT_CLINIC_OR_DEPARTMENT_OTHER): Payer: Medicare Other

## 2016-02-07 ENCOUNTER — Telehealth: Payer: Self-pay | Admitting: *Deleted

## 2016-02-07 ENCOUNTER — Telehealth: Payer: Self-pay

## 2016-02-07 ENCOUNTER — Ambulatory Visit (HOSPITAL_BASED_OUTPATIENT_CLINIC_OR_DEPARTMENT_OTHER): Payer: Medicare Other

## 2016-02-07 ENCOUNTER — Telehealth: Payer: Self-pay | Admitting: Oncology

## 2016-02-07 ENCOUNTER — Ambulatory Visit: Payer: Medicare Other

## 2016-02-07 ENCOUNTER — Other Ambulatory Visit: Payer: Self-pay | Admitting: Oncology

## 2016-02-07 VITALS — BP 123/76 | HR 92 | Temp 97.7°F | Resp 18

## 2016-02-07 DIAGNOSIS — C562 Malignant neoplasm of left ovary: Secondary | ICD-10-CM

## 2016-02-07 DIAGNOSIS — Z5111 Encounter for antineoplastic chemotherapy: Secondary | ICD-10-CM

## 2016-02-07 DIAGNOSIS — C561 Malignant neoplasm of right ovary: Secondary | ICD-10-CM

## 2016-02-07 DIAGNOSIS — Z95828 Presence of other vascular implants and grafts: Secondary | ICD-10-CM

## 2016-02-07 DIAGNOSIS — C563 Malignant neoplasm of bilateral ovaries: Secondary | ICD-10-CM

## 2016-02-07 DIAGNOSIS — C800 Disseminated malignant neoplasm, unspecified: Secondary | ICD-10-CM

## 2016-02-07 LAB — CBC WITH DIFFERENTIAL/PLATELET
BASO%: 0.2 % (ref 0.0–2.0)
Basophils Absolute: 0 10*3/uL (ref 0.0–0.1)
EOS%: 0 % (ref 0.0–7.0)
Eosinophils Absolute: 0 10*3/uL (ref 0.0–0.5)
HCT: 30.7 % — ABNORMAL LOW (ref 34.8–46.6)
HGB: 10.2 g/dL — ABNORMAL LOW (ref 11.6–15.9)
LYMPH%: 19.5 % (ref 14.0–49.7)
MCH: 31.2 pg (ref 25.1–34.0)
MCHC: 33.2 g/dL (ref 31.5–36.0)
MCV: 94.2 fL (ref 79.5–101.0)
MONO#: 0 10*3/uL — ABNORMAL LOW (ref 0.1–0.9)
MONO%: 0.6 % (ref 0.0–14.0)
NEUT#: 2.5 10*3/uL (ref 1.5–6.5)
NEUT%: 79.7 % — ABNORMAL HIGH (ref 38.4–76.8)
Platelets: 273 10*3/uL (ref 145–400)
RBC: 3.26 10*6/uL — ABNORMAL LOW (ref 3.70–5.45)
RDW: 17.8 % — ABNORMAL HIGH (ref 11.2–14.5)
WBC: 3.1 10*3/uL — ABNORMAL LOW (ref 3.9–10.3)
lymph#: 0.6 10*3/uL — ABNORMAL LOW (ref 0.9–3.3)

## 2016-02-07 LAB — COMPREHENSIVE METABOLIC PANEL
ALT: 37 U/L (ref 0–55)
AST: 15 U/L (ref 5–34)
Albumin: 3.4 g/dL — ABNORMAL LOW (ref 3.5–5.0)
Alkaline Phosphatase: 115 U/L (ref 40–150)
Anion Gap: 11 mEq/L (ref 3–11)
BUN: 13.7 mg/dL (ref 7.0–26.0)
CO2: 22 mEq/L (ref 22–29)
Calcium: 9.3 mg/dL (ref 8.4–10.4)
Chloride: 106 mEq/L (ref 98–109)
Creatinine: 0.8 mg/dL (ref 0.6–1.1)
EGFR: 84 mL/min/{1.73_m2} — ABNORMAL LOW (ref >90)
Glucose: 167 mg/dl — ABNORMAL HIGH (ref 70–140)
Potassium: 3.7 mEq/L (ref 3.5–5.1)
Sodium: 138 mEq/L (ref 136–145)
Total Bilirubin: 1.09 mg/dL (ref 0.20–1.20)
Total Protein: 7.2 g/dL (ref 6.4–8.3)

## 2016-02-07 MED ORDER — FAMOTIDINE IN NACL 20-0.9 MG/50ML-% IV SOLN
INTRAVENOUS | Status: AC
  Filled 2016-02-07: qty 50

## 2016-02-07 MED ORDER — SODIUM CHLORIDE 0.9 % IV SOLN
Freq: Once | INTRAVENOUS | Status: AC
  Administered 2016-02-07: 11:00:00 via INTRAVENOUS

## 2016-02-07 MED ORDER — SODIUM CHLORIDE 0.9 % IV SOLN
Freq: Once | INTRAVENOUS | Status: AC
  Administered 2016-02-07: 11:00:00 via INTRAVENOUS
  Filled 2016-02-07: qty 4

## 2016-02-07 MED ORDER — DIPHENHYDRAMINE HCL 50 MG/ML IJ SOLN
INTRAMUSCULAR | Status: AC
  Filled 2016-02-07: qty 1

## 2016-02-07 MED ORDER — FAMOTIDINE IN NACL 20-0.9 MG/50ML-% IV SOLN
20.0000 mg | Freq: Once | INTRAVENOUS | Status: AC
  Administered 2016-02-07: 20 mg via INTRAVENOUS

## 2016-02-07 MED ORDER — SODIUM CHLORIDE 0.9 % IV SOLN: 20.0000 mg | Freq: Once | INTRAVENOUS | Status: DC

## 2016-02-07 MED ORDER — PACLITAXEL CHEMO INJECTION 300 MG/50ML
80.0000 mg/m2 | Freq: Once | INTRAVENOUS | Status: AC
  Administered 2016-02-07: 162 mg via INTRAVENOUS
  Filled 2016-02-07: qty 27

## 2016-02-07 MED ORDER — SODIUM CHLORIDE 0.9% FLUSH
10.0000 mL | INTRAVENOUS | Status: DC | PRN
  Administered 2016-02-07: 10 mL via INTRAVENOUS
  Filled 2016-02-07: qty 10

## 2016-02-07 MED ORDER — SODIUM CHLORIDE 0.9 % IV SOLN: Freq: Once | INTRAVENOUS | Status: DC

## 2016-02-07 MED ORDER — DIPHENHYDRAMINE HCL 50 MG/ML IJ SOLN
25.0000 mg | Freq: Once | INTRAMUSCULAR | Status: AC
  Administered 2016-02-07: 25 mg via INTRAVENOUS

## 2016-02-07 MED ORDER — HEPARIN SOD (PORK) LOCK FLUSH 100 UNIT/ML IV SOLN
500.0000 [IU] | Freq: Once | INTRAVENOUS | Status: AC
  Administered 2016-02-07: 500 [IU] via INTRAVENOUS
  Filled 2016-02-07: qty 5

## 2016-02-07 NOTE — Patient Instructions (Signed)
[facility] Discharge Instructions for Patients Receiving Chemotherapy  Today you received the following chemotherapy agents taxol  To help prevent nausea and vomiting after your treatment, we encourage you to take your nausea medication as directed   If you develop nausea and vomiting that is not controlled by your nausea medication, call the clinic.   BELOW ARE SYMPTOMS THAT SHOULD BE REPORTED IMMEDIATELY:  *FEVER GREATER THAN 100.5 F  *CHILLS WITH OR WITHOUT FEVER  NAUSEA AND VOMITING THAT IS NOT CONTROLLED WITH YOUR NAUSEA MEDICATION  *UNUSUAL SHORTNESS OF BREATH  *UNUSUAL BRUISING OR BLEEDING  TENDERNESS IN MOUTH AND THROAT WITH OR WITHOUT PRESENCE OF ULCERS  *URINARY PROBLEMS  *BOWEL PROBLEMS  UNUSUAL RASH Items with * indicate a potential emergency and should be followed up as soon as possible.  Feel free to call the clinic you have any questions or concerns. The clinic phone number is [phone].  

## 2016-02-07 NOTE — Telephone Encounter (Signed)
Per staff message and POF I have scheduled appts. Advised scheduler of appts. JMW  

## 2016-02-07 NOTE — Telephone Encounter (Signed)
S/w [name] about granix shot tomorrow at 11 am.

## 2016-02-07 NOTE — Telephone Encounter (Signed)
Sent staff message to Dr. Denman George scheduler to set appt for end of March or April per 2/27 pof

## 2016-02-07 NOTE — Patient Instructions (Signed)

## 2016-02-08 ENCOUNTER — Ambulatory Visit (HOSPITAL_BASED_OUTPATIENT_CLINIC_OR_DEPARTMENT_OTHER): Payer: Medicare Other

## 2016-02-08 VITALS — BP 126/60 | HR 87 | Temp 97.9°F | Resp 20

## 2016-02-08 DIAGNOSIS — C562 Malignant neoplasm of left ovary: Secondary | ICD-10-CM

## 2016-02-08 DIAGNOSIS — C561 Malignant neoplasm of right ovary: Secondary | ICD-10-CM | POA: Diagnosis not present

## 2016-02-08 DIAGNOSIS — D701 Agranulocytosis secondary to cancer chemotherapy: Secondary | ICD-10-CM

## 2016-02-08 MED ORDER — TBO-FILGRASTIM 480 MCG/0.8ML ~~LOC~~ SOSY
480.0000 ug | PREFILLED_SYRINGE | Freq: Once | SUBCUTANEOUS | Status: AC
  Administered 2016-02-08: 480 ug via SUBCUTANEOUS

## 2016-02-10 ENCOUNTER — Ambulatory Visit (HOSPITAL_BASED_OUTPATIENT_CLINIC_OR_DEPARTMENT_OTHER): Payer: Medicare Other

## 2016-02-10 VITALS — BP 116/75 | HR 88 | Temp 97.6°F | Resp 18

## 2016-02-10 DIAGNOSIS — Z452 Encounter for adjustment and management of vascular access device: Secondary | ICD-10-CM

## 2016-02-10 DIAGNOSIS — C801 Malignant (primary) neoplasm, unspecified: Secondary | ICD-10-CM

## 2016-02-10 DIAGNOSIS — Z95828 Presence of other vascular implants and grafts: Secondary | ICD-10-CM

## 2016-02-10 MED ORDER — HEPARIN SOD (PORK) LOCK FLUSH 100 UNIT/ML IV SOLN
500.0000 [IU] | Freq: Once | INTRAVENOUS | Status: AC
  Administered 2016-02-10: 500 [IU] via INTRAVENOUS
  Filled 2016-02-10: qty 5

## 2016-02-10 MED ORDER — SODIUM CHLORIDE 0.9% FLUSH
10.0000 mL | INTRAVENOUS | Status: DC | PRN
Start: 2016-02-10 — End: 2016-02-10
  Administered 2016-02-10: 10 mL via INTRAVENOUS
  Filled 2016-02-10: qty 10

## 2016-02-10 NOTE — Patient Instructions (Signed)

## 2016-02-14 ENCOUNTER — Ambulatory Visit: Payer: Medicare Other

## 2016-02-14 ENCOUNTER — Ambulatory Visit (HOSPITAL_BASED_OUTPATIENT_CLINIC_OR_DEPARTMENT_OTHER): Payer: Medicare Other

## 2016-02-14 ENCOUNTER — Other Ambulatory Visit (HOSPITAL_BASED_OUTPATIENT_CLINIC_OR_DEPARTMENT_OTHER): Payer: Medicare Other

## 2016-02-14 VITALS — BP 108/59 | HR 70 | Temp 97.1°F | Resp 18

## 2016-02-14 DIAGNOSIS — C562 Malignant neoplasm of left ovary: Secondary | ICD-10-CM

## 2016-02-14 DIAGNOSIS — C800 Disseminated malignant neoplasm, unspecified: Secondary | ICD-10-CM

## 2016-02-14 DIAGNOSIS — C561 Malignant neoplasm of right ovary: Secondary | ICD-10-CM | POA: Diagnosis present

## 2016-02-14 DIAGNOSIS — Z95828 Presence of other vascular implants and grafts: Secondary | ICD-10-CM

## 2016-02-14 DIAGNOSIS — C563 Malignant neoplasm of bilateral ovaries: Secondary | ICD-10-CM

## 2016-02-14 DIAGNOSIS — Z5111 Encounter for antineoplastic chemotherapy: Secondary | ICD-10-CM

## 2016-02-14 LAB — COMPREHENSIVE METABOLIC PANEL
ALT: 37 U/L (ref 0–55)
AST: 17 U/L (ref 5–34)
Albumin: 3.2 g/dL — ABNORMAL LOW (ref 3.5–5.0)
Alkaline Phosphatase: 95 U/L (ref 40–150)
Anion Gap: 10 mEq/L (ref 3–11)
BUN: 15.3 mg/dL (ref 7.0–26.0)
CO2: 23 mEq/L (ref 22–29)
Calcium: 9.5 mg/dL (ref 8.4–10.4)
Chloride: 106 mEq/L (ref 98–109)
Creatinine: 0.8 mg/dL (ref 0.6–1.1)
EGFR: >90 mL/min/{1.73_m2} (ref >90)
Glucose: 186 mg/dl — ABNORMAL HIGH (ref 70–140)
Potassium: 4 mEq/L (ref 3.5–5.1)
Sodium: 138 mEq/L (ref 136–145)
Total Bilirubin: 0.76 mg/dL (ref 0.20–1.20)
Total Protein: 6.8 g/dL (ref 6.4–8.3)

## 2016-02-14 LAB — CBC WITH DIFFERENTIAL/PLATELET
BASO%: 0 % (ref 0.0–2.0)
Basophils Absolute: 0 10*3/uL (ref 0.0–0.1)
EOS%: 0 % (ref 0.0–7.0)
Eosinophils Absolute: 0 10*3/uL (ref 0.0–0.5)
HCT: 28.7 % — ABNORMAL LOW (ref 34.8–46.6)
HGB: 9.8 g/dL — ABNORMAL LOW (ref 11.6–15.9)
LYMPH%: 28.3 % (ref 14.0–49.7)
MCH: 31.4 pg (ref 25.1–34.0)
MCHC: 34.1 g/dL (ref 31.5–36.0)
MCV: 92 fL (ref 79.5–101.0)
MONO#: 0 10*3/uL — ABNORMAL LOW (ref 0.1–0.9)
MONO%: 0.6 % (ref 0.0–14.0)
NEUT#: 2.3 10*3/uL (ref 1.5–6.5)
NEUT%: 71.1 % (ref 38.4–76.8)
Platelets: 325 10*3/uL (ref 145–400)
RBC: 3.12 10*6/uL — ABNORMAL LOW (ref 3.70–5.45)
RDW: 19.1 % — ABNORMAL HIGH (ref 11.2–14.5)
WBC: 3.2 10*3/uL — ABNORMAL LOW (ref 3.9–10.3)
lymph#: 0.9 10*3/uL (ref 0.9–3.3)

## 2016-02-14 MED ORDER — DIPHENHYDRAMINE HCL 50 MG/ML IJ SOLN
INTRAMUSCULAR | Status: AC
Start: 2016-02-14 — End: 2016-02-14
  Filled 2016-02-14: qty 1

## 2016-02-14 MED ORDER — SODIUM CHLORIDE 0.9 % IV SOLN
Freq: Once | INTRAVENOUS | Status: AC
  Administered 2016-02-14: 11:00:00 via INTRAVENOUS

## 2016-02-14 MED ORDER — HEPARIN SOD (PORK) LOCK FLUSH 100 UNIT/ML IV SOLN
500.0000 [IU] | Freq: Once | INTRAVENOUS | Status: AC | PRN
  Administered 2016-02-14: 500 [IU]
  Filled 2016-02-14: qty 5

## 2016-02-14 MED ORDER — SODIUM CHLORIDE 0.9% FLUSH
10.0000 mL | INTRAVENOUS | Status: DC | PRN
  Administered 2016-02-14: 10 mL via INTRAVENOUS
  Filled 2016-02-14: qty 10

## 2016-02-14 MED ORDER — FAMOTIDINE IN NACL 20-0.9 MG/50ML-% IV SOLN
20.0000 mg | Freq: Once | INTRAVENOUS | Status: AC
  Administered 2016-02-14: 20 mg via INTRAVENOUS

## 2016-02-14 MED ORDER — SODIUM CHLORIDE 0.9 % IV SOLN
20.0000 mg | Freq: Once | INTRAVENOUS | Status: AC
  Administered 2016-02-14: 20 mg via INTRAVENOUS
  Filled 2016-02-14: qty 2

## 2016-02-14 MED ORDER — FAMOTIDINE IN NACL 20-0.9 MG/50ML-% IV SOLN
INTRAVENOUS | Status: AC
Start: 2016-02-14 — End: 2016-02-14
  Filled 2016-02-14: qty 50

## 2016-02-14 MED ORDER — PALONOSETRON HCL INJECTION 0.25 MG/5ML
0.2500 mg | Freq: Once | INTRAVENOUS | Status: AC
  Administered 2016-02-14: 0.25 mg via INTRAVENOUS

## 2016-02-14 MED ORDER — DIPHENHYDRAMINE HCL 50 MG/ML IJ SOLN
25.0000 mg | Freq: Once | INTRAMUSCULAR | Status: AC
  Administered 2016-02-14: 25 mg via INTRAVENOUS

## 2016-02-14 MED ORDER — SODIUM CHLORIDE 0.9 % IJ SOLN
10.0000 mL | INTRAMUSCULAR | Status: DC | PRN
  Administered 2016-02-14: 10 mL
  Filled 2016-02-14: qty 10

## 2016-02-14 MED ORDER — PALONOSETRON HCL INJECTION 0.25 MG/5ML
INTRAVENOUS | Status: AC
  Filled 2016-02-14: qty 5

## 2016-02-14 MED ORDER — SODIUM CHLORIDE 0.9 % IV SOLN
509.0000 mg | Freq: Once | INTRAVENOUS | Status: AC
  Administered 2016-02-14: 510 mg via INTRAVENOUS
  Filled 2016-02-14: qty 51

## 2016-02-14 MED ORDER — PACLITAXEL CHEMO INJECTION 300 MG/50ML
80.0000 mg/m2 | Freq: Once | INTRAVENOUS | Status: AC
  Administered 2016-02-14: 162 mg via INTRAVENOUS
  Filled 2016-02-14: qty 27

## 2016-02-14 NOTE — Patient Instructions (Signed)
[facility] Discharge Instructions for Patients Receiving Chemotherapy  Today you received the following chemotherapy agents: Taxol and carboplatin   To help prevent nausea and vomiting after your treatment, we encourage you to take your nausea medication as directed.    If you develop nausea and vomiting that is not controlled by your nausea medication, call the clinic.   BELOW ARE SYMPTOMS THAT SHOULD BE REPORTED IMMEDIATELY:  *FEVER GREATER THAN 100.5 F  *CHILLS WITH OR WITHOUT FEVER  NAUSEA AND VOMITING THAT IS NOT CONTROLLED WITH YOUR NAUSEA MEDICATION  *UNUSUAL SHORTNESS OF BREATH  *UNUSUAL BRUISING OR BLEEDING  TENDERNESS IN MOUTH AND THROAT WITH OR WITHOUT PRESENCE OF ULCERS  *URINARY PROBLEMS  *BOWEL PROBLEMS  UNUSUAL RASH Items with * indicate a potential emergency and should be followed up as soon as possible.  Feel free to call the clinic you have any questions or concerns. The clinic phone number is [phone].  Please show the CHEMO ALERT CARD at check-in to the Emergency Department and triage nurse.   

## 2016-02-14 NOTE — Patient Instructions (Signed)

## 2016-02-15 ENCOUNTER — Ambulatory Visit (HOSPITAL_BASED_OUTPATIENT_CLINIC_OR_DEPARTMENT_OTHER): Payer: Medicare Other

## 2016-02-15 VITALS — BP 126/73 | HR 85 | Temp 98.5°F | Resp 18

## 2016-02-15 DIAGNOSIS — C562 Malignant neoplasm of left ovary: Secondary | ICD-10-CM

## 2016-02-15 DIAGNOSIS — C563 Malignant neoplasm of bilateral ovaries: Secondary | ICD-10-CM

## 2016-02-15 DIAGNOSIS — C561 Malignant neoplasm of right ovary: Secondary | ICD-10-CM

## 2016-02-15 MED ORDER — TBO-FILGRASTIM 480 MCG/0.8ML ~~LOC~~ SOSY
480.0000 ug | PREFILLED_SYRINGE | Freq: Once | SUBCUTANEOUS | Status: AC
  Administered 2016-02-15: 480 ug via SUBCUTANEOUS

## 2016-02-15 NOTE — Patient Instructions (Signed)

## 2016-02-16 ENCOUNTER — Other Ambulatory Visit: Payer: Self-pay | Admitting: Oncology

## 2016-02-16 DIAGNOSIS — C561 Malignant neoplasm of right ovary: Secondary | ICD-10-CM

## 2016-02-16 DIAGNOSIS — C800 Disseminated malignant neoplasm, unspecified: Secondary | ICD-10-CM

## 2016-02-16 DIAGNOSIS — C563 Malignant neoplasm of bilateral ovaries: Secondary | ICD-10-CM

## 2016-02-16 DIAGNOSIS — C562 Malignant neoplasm of left ovary: Secondary | ICD-10-CM

## 2016-02-17 ENCOUNTER — Ambulatory Visit (HOSPITAL_BASED_OUTPATIENT_CLINIC_OR_DEPARTMENT_OTHER): Payer: Medicare Other

## 2016-02-17 ENCOUNTER — Ambulatory Visit (HOSPITAL_BASED_OUTPATIENT_CLINIC_OR_DEPARTMENT_OTHER): Payer: Medicare Other | Admitting: Oncology

## 2016-02-17 ENCOUNTER — Other Ambulatory Visit: Payer: Self-pay

## 2016-02-17 VITALS — BP 121/61 | HR 87 | Temp 97.4°F | Resp 18 | Ht 68.5 in | Wt 183.0 lb

## 2016-02-17 DIAGNOSIS — C561 Malignant neoplasm of right ovary: Secondary | ICD-10-CM

## 2016-02-17 DIAGNOSIS — Z853 Personal history of malignant neoplasm of breast: Secondary | ICD-10-CM

## 2016-02-17 DIAGNOSIS — C562 Malignant neoplasm of left ovary: Secondary | ICD-10-CM | POA: Diagnosis not present

## 2016-02-17 DIAGNOSIS — R4701 Aphasia: Secondary | ICD-10-CM

## 2016-02-17 DIAGNOSIS — C563 Malignant neoplasm of bilateral ovaries: Secondary | ICD-10-CM

## 2016-02-17 DIAGNOSIS — C774 Secondary and unspecified malignant neoplasm of inguinal and lower limb lymph nodes: Secondary | ICD-10-CM

## 2016-02-17 DIAGNOSIS — Z7901 Long term (current) use of anticoagulants: Secondary | ICD-10-CM

## 2016-02-17 DIAGNOSIS — I4891 Unspecified atrial fibrillation: Secondary | ICD-10-CM | POA: Diagnosis not present

## 2016-02-17 DIAGNOSIS — C800 Disseminated malignant neoplasm, unspecified: Secondary | ICD-10-CM

## 2016-02-17 DIAGNOSIS — D701 Agranulocytosis secondary to cancer chemotherapy: Secondary | ICD-10-CM

## 2016-02-17 DIAGNOSIS — I69351 Hemiplegia and hemiparesis following cerebral infarction affecting right dominant side: Secondary | ICD-10-CM

## 2016-02-17 DIAGNOSIS — I10 Essential (primary) hypertension: Secondary | ICD-10-CM | POA: Diagnosis not present

## 2016-02-17 DIAGNOSIS — C801 Malignant (primary) neoplasm, unspecified: Secondary | ICD-10-CM

## 2016-02-17 DIAGNOSIS — Z8673 Personal history of transient ischemic attack (TIA), and cerebral infarction without residual deficits: Secondary | ICD-10-CM | POA: Diagnosis not present

## 2016-02-17 DIAGNOSIS — G620 Drug-induced polyneuropathy: Secondary | ICD-10-CM

## 2016-02-17 DIAGNOSIS — C569 Malignant neoplasm of unspecified ovary: Secondary | ICD-10-CM

## 2016-02-17 DIAGNOSIS — G622 Polyneuropathy due to other toxic agents: Secondary | ICD-10-CM | POA: Diagnosis not present

## 2016-02-17 DIAGNOSIS — Z5189 Encounter for other specified aftercare: Secondary | ICD-10-CM

## 2016-02-17 DIAGNOSIS — T451X5A Adverse effect of antineoplastic and immunosuppressive drugs, initial encounter: Secondary | ICD-10-CM

## 2016-02-17 DIAGNOSIS — G8191 Hemiplegia, unspecified affecting right dominant side: Secondary | ICD-10-CM

## 2016-02-17 MED ORDER — TBO-FILGRASTIM 480 MCG/0.8ML ~~LOC~~ SOSY
480.0000 ug | PREFILLED_SYRINGE | Freq: Once | SUBCUTANEOUS | Status: AC
  Administered 2016-02-17: 480 ug via SUBCUTANEOUS
  Filled 2016-02-17: qty 0.8

## 2016-02-17 MED ORDER — DEXAMETHASONE 4 MG PO TABS: ORAL_TABLET | ORAL | Status: DC

## 2016-02-17 NOTE — Progress Notes (Signed)
OFFICE PROGRESS NOTE   February 17, 2016   Physicians: Everitt Amber, Lendon Collar (PCP), Lindell Spar [phone] neurology [place]), Dr Valetta Fuller (cardiology [place], [phone], new patient apt 01-15-16) Carol Ada (Alapati P. Marcello Moores, PCP [place], [place] [phone]) ( (oncologist Dr Maudie Mercury in [place], [place]; breast radiation given at [facility] in [place]). 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 She had day 1 cycle 3 dose dense carbo taxol on 02-14-16 with granix 3-11 and 3-13. Plan is for day 8 and day 15 cycle 3 treatments with granix days 9 and 16, then CT AP 3-27 prior to seeing Dr Denman George on 03-09-16 for consideration of interval surgery. On Eliquis and ASA for a fib with CVA  Patient has had some increased fatigue, mild nausea and taste disturbance since carbo/ taxol last week, but still up as usual at home and still eating. She does not seem to have increased peripheral neuropathy noticeable on right (right hemiplegia) and possibly just minimal in toes on left. Bowels are moving. No abdominal or pelvic pain, no abdominal distension now. Voiding ok. Sleeping ok. Denies SOB. No bleeding. No problems with PAC. No LE swelling. No new neurologic symptoms. Remainder of 10 point Review of Systems negative as far as can be ascertained with patient and very helpful family.  CA 125 12-24-15 by [employer] ECLIA 608, and on 01-01-16 741 PICC placed RUE by IR 01-17-16 Flu vaccine 10-10-15 No genetics testing known with prior breast cancer  ONCOLOGIC HISTORY Patient presented to ED in [place] [place] 11-28-15 with LLQ pain and left hip pain, symptomatic for at least a year per family but worse. CT AP 11-28-15 had bilateral ovarian masses  up to 4 cm on right, with peritoneal carcinomatosis in pelvis, omentum and perihepatic, with small ascites, bilateral pelvic and inguinal adenopathy, with retroperitoneal adenopathy extending to pelvic brim. 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 Recommendation was to begin treatment with carboplatin taxol, with reevaluation after 3 cycles for possible interval debulking surgery. Day 1 cycle 1 dose dense carbo taxol was given on 12-27-15, then missed day 8 cycle 1 due to lack of IV access. ANC was 0.1 on day 14 (01-09-16) with granix given x3 and day 15 delayed another week. She received day 15 cycle 1 on 01-17-16 after PICC placed that day.  She has history of left breast cancer 2005 treated in [place] with lumpectomy, chemotherapy and radiation, none of those records presently available.    Objective:  Vital signs in last 24 hours:  BP 121/61 mmHg  Pulse 87  Temp(Src) 97.4 F (36.3 C) (Oral)  Resp 18  Ht 5' 8.5" (1.74 m)  Wt 183 lb (83.008 kg)  BMI 27.42 kg/m2  SpO2 100% Weight up 4 lbs Alert, oriented and appropriate. Ambulatory with some difficulty due to right sided weakness, family assists, not using cane or walker. Alopecia  HEENT:PERRL, sclerae not icteric. Oral mucosa moist without lesions, posterior pharynx clear.  Neck supple. No JVD.  Lymphatics:no cervical,supraclavicular or inguinal adenopathy Resp: clear to  auscultation bilaterally and normal percussion bilaterally Cardio: regular rate and rhythm. No gallop. GI: soft, nontender, not distended, no mass or organomegaly. Normally active bowel sounds. Musculoskeletal/ Extremities: without pitting edema, cords, tenderness Neuro: no  significant peripheral neuropathy LUE/ LLE. Right sided weakness UE > LE. Expressive speech difficulty. Skin without rash, ecchymosis, petechiae Portacath-without erythema or tenderness  Lab Results:  Results for orders placed or performed in visit on 02/14/16  CBC with Differential  Result Value Ref Range   WBC 3.2 (L) 3.9 - 10.3 10e3/uL   NEUT# 2.3 1.5 - 6.5 10e3/uL   HGB 9.8 (L) 11.6 - 15.9 g/dL   HCT 28.7 (L) 34.8 - 46.6 %   Platelets 325 145 - 400 10e3/uL   MCV 92.0 79.5 - 101.0 fL   MCH 31.4 25.1 - 34.0 pg   MCHC 34.1 31.5 - 36.0 g/dL   RBC 3.12 (L) 3.70 - 5.45 10e6/uL   RDW 19.1 (H) 11.2 - 14.5 %   lymph# 0.9 0.9 - 3.3 10e3/uL   MONO# 0.0 (L) 0.1 - 0.9 10e3/uL   Eosinophils Absolute 0.0 0.0 - 0.5 10e3/uL   Basophils Absolute 0.0 0.0 - 0.1 10e3/uL   NEUT% 71.1 38.4 - 76.8 %   LYMPH% 28.3 14.0 - 49.7 %   MONO% 0.6 0.0 - 14.0 %   EOS% 0.0 0.0 - 7.0 %   BASO% 0.0 0.0 - 2.0 %  Comprehensive metabolic panel  Result Value Ref Range   Sodium 138 136 - 145 mEq/L   Potassium 4.0 3.5 - 5.1 mEq/L   Chloride 106 98 - 109 mEq/L   CO2 23 22 - 29 mEq/L   Glucose 186 (H) 70 - 140 mg/dl   BUN 15.3 7.0 - 26.0 mg/dL   Creatinine 0.8 0.6 - 1.1 mg/dL   Total Bilirubin 0.76 0.20 - 1.20 mg/dL   Alkaline Phosphatase 95 40 - 150 U/L   AST 17 5 - 34 U/L   ALT 37 0 - 55 U/L   Total Protein 6.8 6.4 - 8.3 g/dL   Albumin 3.2 (L) 3.5 - 5.0 g/dL   Calcium 9.5 8.4 - 10.4 mg/dL   Anion Gap 10 3 - 11 mEq/L   EGFR >90 >90 ml/min/1.73 m2    Last CA 125 from 02-03-16 was 137, this down from 608 on 12-24-15. Repeat CA 125 ordered for 02-28-16.  Studies/Results:  No results found. CT AP pending 03-02-16  Medications: I have reviewed the patient's current medications. She is using some prn antiemetics, helpful. Granix given today (2 doses after day 1, then day 9 and day 15 on present dose dense regimen)  DISCUSSION Interval history reviewed. Not surprising that she has a few more symptoms after day  1 chemo, but overall is tolerating treatment very well and is clinically responding. Patient and family are in agreement with continuing as planned. They understand that, depending on restaging evaluation, recommendation could be to continue chemo vs interval surgery followed by additional chemo.  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 Symptomatically improved, CA 125 improving. Continue treatment as planned. Granix. CT and visit back to Dr Denman George scheduled. Needs genetics referral, may wait until after surgery so information re primary for the gyn cancer is known. 2. 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 Hopefully will be able to resume outpatient PT/ OT/ speech  therapy in near future. Followed by neurology in [facility]. History of TIAs 2010  3.PAC in by IR 01-29-16; PICC removed. Very difficult peripheral IV access. 4.atrial fibrillation diagnosed ~ 2011,  now on Eliquis + ASA. Now followed by Dr Valetta Fuller [facility], next visit ~ 6 months. No problems with chemo thrombocytopenia thus far, but need to be aware of anticoagulation if so. 5.Left breast cancer 2005 diagnosed and treated in [place] with lumpectomy and presumably axillary node evaluation, combination chemotherapy (drugs not known) via peripheral veins, local radiation. Up to date on mammograms, done 10-18-15 at [facility]. We will try to get records from prior PCP in [place], as family not able to provide names or contacts of oncologists. 6.hx HTN 7.colon polyps at colonoscopy by Dr Benson Norway 2015. Follow up not specified on the report in EMR 8.post appendectomy, BTL 9.family history of cardiac disease with MIs in sisters ages 78 and 2, and in brother 10.flu vaccine done 10-2015 58.information on Advance  Directives given 12.chemo neutropenia with ANC 0.1 on day 14 cycle 1 (despite day 8 held). Continue gCSF support 13. Chemo peripheral neuropathy: minimal thus far, exercise and massage feet, shoes with good support, follow. 14.weight loss initially with improvement in cancer, now gaining.    All questions answered. Chemo and granix orders confirmed. Time spent 25 min including >50% counseling and coordination of care. Cc Drs Jacquenette Shone, Powers, Darrick Huntsman, MD   02/17/2016, 4:51 PM

## 2016-02-18 ENCOUNTER — Encounter: Payer: Self-pay | Admitting: Oncology

## 2016-02-20 ENCOUNTER — Emergency Department (HOSPITAL_COMMUNITY): Payer: Medicare Other

## 2016-02-20 ENCOUNTER — Emergency Department (HOSPITAL_COMMUNITY)
Admission: EM | Admit: 2016-02-20 | Discharge: 2016-02-20 | Disposition: A | Discharge status: Home / Self Care | Payer: Medicare Other | Attending: Emergency Medicine | Admitting: Emergency Medicine

## 2016-02-20 ENCOUNTER — Other Ambulatory Visit: Payer: Self-pay

## 2016-02-20 ENCOUNTER — Encounter (HOSPITAL_COMMUNITY): Payer: Self-pay | Admitting: *Deleted

## 2016-02-20 DIAGNOSIS — F419 Anxiety disorder, unspecified: Secondary | ICD-10-CM | POA: Diagnosis not present

## 2016-02-20 DIAGNOSIS — Z79899 Other long term (current) drug therapy: Secondary | ICD-10-CM | POA: Insufficient documentation

## 2016-02-20 DIAGNOSIS — Z7982 Long term (current) use of aspirin: Secondary | ICD-10-CM | POA: Insufficient documentation

## 2016-02-20 DIAGNOSIS — Z853 Personal history of malignant neoplasm of breast: Secondary | ICD-10-CM | POA: Diagnosis not present

## 2016-02-20 DIAGNOSIS — M1990 Unspecified osteoarthritis, unspecified site: Secondary | ICD-10-CM | POA: Diagnosis not present

## 2016-02-20 DIAGNOSIS — E785 Hyperlipidemia, unspecified: Secondary | ICD-10-CM | POA: Insufficient documentation

## 2016-02-20 DIAGNOSIS — R404 Transient alteration of awareness: Secondary | ICD-10-CM | POA: Diagnosis not present

## 2016-02-20 DIAGNOSIS — R112 Nausea with vomiting, unspecified: Secondary | ICD-10-CM | POA: Diagnosis not present

## 2016-02-20 DIAGNOSIS — G252 Other specified forms of tremor: Secondary | ICD-10-CM | POA: Diagnosis not present

## 2016-02-20 DIAGNOSIS — Z8673 Personal history of transient ischemic attack (TIA), and cerebral infarction without residual deficits: Secondary | ICD-10-CM | POA: Diagnosis not present

## 2016-02-20 DIAGNOSIS — IMO0001 Reserved for inherently not codable concepts without codable children: Secondary | ICD-10-CM

## 2016-02-20 DIAGNOSIS — Z7901 Long term (current) use of anticoagulants: Secondary | ICD-10-CM | POA: Insufficient documentation

## 2016-02-20 DIAGNOSIS — R42 Dizziness and giddiness: Secondary | ICD-10-CM | POA: Diagnosis not present

## 2016-02-20 DIAGNOSIS — I4891 Unspecified atrial fibrillation: Secondary | ICD-10-CM | POA: Insufficient documentation

## 2016-02-20 DIAGNOSIS — R251 Tremor, unspecified: Secondary | ICD-10-CM | POA: Diagnosis not present

## 2016-02-20 DIAGNOSIS — I10 Essential (primary) hypertension: Secondary | ICD-10-CM | POA: Diagnosis not present

## 2016-02-20 DIAGNOSIS — R531 Weakness: Secondary | ICD-10-CM | POA: Diagnosis not present

## 2016-02-20 LAB — CBC WITH DIFFERENTIAL/PLATELET
Basophils Absolute: 0 10*3/uL (ref 0.0–0.1)
Basophils Relative: 0 %
Eosinophils Absolute: 0 10*3/uL (ref 0.0–0.7)
Eosinophils Relative: 0 %
HCT: 28.6 % — ABNORMAL LOW (ref 36.0–46.0)
Hemoglobin: 9.6 g/dL — ABNORMAL LOW (ref 12.0–15.0)
Lymphocytes Relative: 19 %
Lymphs Abs: 1.1 10*3/uL (ref 0.7–4.0)
MCH: 31.5 pg (ref 26.0–34.0)
MCHC: 33.6 g/dL (ref 30.0–36.0)
MCV: 93.8 fL (ref 78.0–100.0)
Monocytes Absolute: 0.5 10*3/uL (ref 0.1–1.0)
Monocytes Relative: 9 %
Neutro Abs: 4 10*3/uL (ref 1.7–7.7)
Neutrophils Relative %: 72 %
Platelets: 255 10*3/uL (ref 150–400)
RBC: 3.05 MIL/uL — ABNORMAL LOW (ref 3.87–5.11)
RDW: 20.3 % — ABNORMAL HIGH (ref 11.5–15.5)
WBC: 5.7 10*3/uL (ref 4.0–10.5)

## 2016-02-20 LAB — COMPREHENSIVE METABOLIC PANEL
ALT: 42 U/L (ref 14–54)
AST: 22 U/L (ref 15–41)
Albumin: 3.3 g/dL — ABNORMAL LOW (ref 3.5–5.0)
Alkaline Phosphatase: 78 U/L (ref 38–126)
Anion gap: 9 (ref 5–15)
BUN: 15 mg/dL (ref 6–20)
CO2: 27 mmol/L (ref 22–32)
Calcium: 9.2 mg/dL (ref 8.9–10.3)
Chloride: 103 mmol/L (ref 101–111)
Creatinine, Ser: 0.72 mg/dL (ref 0.44–1.00)
GFR calc Af Amer: >60 mL/min (ref >60)
GFR calc non Af Amer: >60 mL/min (ref >60)
Glucose, Bld: 114 mg/dL — ABNORMAL HIGH (ref 65–99)
Potassium: 4.2 mmol/L (ref 3.5–5.1)
Sodium: 139 mmol/L (ref 135–145)
Total Bilirubin: 1.1 mg/dL (ref 0.3–1.2)
Total Protein: 6.1 g/dL — ABNORMAL LOW (ref 6.5–8.1)

## 2016-02-20 MED ORDER — HEPARIN SOD (PORK) LOCK FLUSH 100 UNIT/ML IV SOLN
500.0000 [IU] | Freq: Once | INTRAVENOUS | Status: AC
  Administered 2016-02-20: 500 [IU]
  Filled 2016-02-20: qty 5

## 2016-02-20 MED ORDER — GADOBENATE DIMEGLUMINE 529 MG/ML IV SOLN
20.0000 mL | Freq: Once | INTRAVENOUS | Status: AC | PRN
  Administered 2016-02-20: 17 mL via INTRAVENOUS

## 2016-02-20 MED ORDER — LORAZEPAM 2 MG/ML IJ SOLN
1.0000 mg | Freq: Once | INTRAMUSCULAR | Status: AC
  Administered 2016-02-20: 1 mg via INTRAVENOUS
  Filled 2016-02-20: qty 1

## 2016-02-20 NOTE — ED Notes (Signed)
Bed: WA18 Expected date:  Expected time:  Means of arrival:  Comments: EMS 

## 2016-02-20 NOTE — ED Notes (Signed)
Per EMS report: pt coming from home and presents to the ED today after a few episodes of vomiting after eating breakfast. Pt also reports weakness and dizziness. Hx stroke, stomach CA with chemo q Friday, afib. Deficits in the right arm, right leg, and speech. Pt has no complaints at this time. EMS reports an HR ranging from 46-65 and irregular enroute and some hypotension

## 2016-02-20 NOTE — Discharge Instructions (Signed)
1. Continue home medications. 2. Start taking your xanax 0.25 mg when you experience these shaking spells. 3.  Follow up with your neurology appointment at [facility] for further neurological evaluation, including an EEG.  Brain MR today did not reveal any acute abnormalities.  Your lab works is within normal limits.

## 2016-02-20 NOTE — ED Notes (Signed)
Patient transported to MRI 

## 2016-02-20 NOTE — ED Provider Notes (Signed)
CSN: [id number]     Arrival date & time 02/20/16  1206 History   First MD Initiated Contact with Patient 02/20/16 1246     Chief Complaint  Patient presents with  . Emesis     (Consider location/radiation/quality/duration/timing/severity/associated sxs/prior Treatment) HPI   Brenda Cunningham is a 65 y.o. female with PMH significant for HTN, Afib on Eliquis, CVA (right sided deficits and stuttered speech), anxiety, gynecologic cancer currently on chemo q Friday who presents with an episode of shaking and unresponsiveness approximately 10:30 AM this morning.  Boyfriend states that he went to help her to bed when her head and left arm was shaking.  He called her name, but she was not responding to him.  He states it lasted for a couple of seconds.  No LOC, tongue biting, incontinence.  No hx of seizures.  She reports feeling nauseous and had an episode of vomiting afterwards.  Denies CP, SOB, HA, abdominal pain, or urinary symptoms.  She does state that "I feels like something is running down the side of her face".   Daughter reports similar episode about 1 month ago when she came in from being outside and began to shake and was unresponsive.  At that time she was seen in the ED, negative head CT, EKG unremarkable, and CMP performed at the office was unremarkable.  She was discharged home.   Past Medical History  Diagnosis Date  . Hypertension   . Arthritis   . A-fib ([place])   . Hyperlipidemia   . Stroke ([place]) 2010  . Breast cancer ([place]) 2008    s/p lumpectomy, chemo and radiation therapy  . Wears glasses   . Anxiety    Past Surgical History  Procedure Laterality Date  . Breast lumpectomy  2008    left  . Appendectomy    . Tubal ligation    . Colonoscopy  2004   Family History  Problem Relation Age of Onset  . Family history unknown: Yes   Social History  Substance Use Topics  . Smoking status: Never Smoker   . Smokeless tobacco: None  . Alcohol Use: 1.2 oz/week    1 Glasses of  wine, 1 Cans of beer per week   OB History    No data available     Review of Systems All other systems negative unless otherwise stated in HPI    Allergies  Codeine  Home Medications   Prior to Admission medications   Medication Sig Start Date End Date Taking? Authorizing Provider  acetaminophen (TYLENOL) 500 MG tablet Take 500-1,000 mg by mouth every 6 (six) hours as needed. Reported on 02/03/2016   Yes Historical Provider, MD  ALPRAZolam Duanne Moron) 0.25 MG tablet Take 1 tablet by mouth up to 2-3 times a day PRN anxiety 12/03/15  Yes Historical Provider, MD  apixaban (ELIQUIS) 5 MG TABS tablet Take 1 tablet (5 mg total) by mouth 2 (two) times daily. 09/11/15  Yes Camelia Eng Tysinger, PA-C  aspirin EC 81 MG tablet Take 81 mg by mouth daily.   Yes Historical Provider, MD  atorvastatin (LIPITOR) 40 MG tablet Take 40 mg by mouth at bedtime.   Yes Historical Provider, MD  dexamethasone (DECADRON) 4 MG tablet Take 5 tablets =20 mg with food 12 hrs before Taxol Chemotherapy Patient taking differently: Take 20 mg by mouth See admin instructions. Take 5 tablets =20 mg with food 12 hrs before Taxol Chemotherapy 02/17/16  Yes Lennis Marion Downer, MD  lidocaine-prilocaine (EMLA) cream Apply to  Porta-Cath 1-2 hours prior to access as directed. Patient taking differently: Apply 1 application topically See admin instructions. Apply to Porta-Cath 1-2 hours prior to access as directed. 12/31/15  Yes Lennis Marion Downer, MD  loratadine (CLARITIN) 10 MG tablet Take 10 mg by mouth daily as needed (Recommended for chemo pain).    Yes Historical Provider, MD  metoprolol (LOPRESSOR) 50 MG tablet Take 50 mg by mouth 2 (two) times daily.   Yes Historical Provider, MD  Multiple Vitamins-Minerals (MULTIVITAMIN WITH MINERALS) tablet Take 1 tablet by mouth daily.   Yes Historical Provider, MD  neomycin-bacitracin-polymyxin (NEOSPORIN) ointment Place 1 application into the nose daily as needed. apply to each nostril once daily  PRN if nose bleeds after blowing   Yes Historical Provider, MD  omega-3 acid ethyl esters (LOVAZA) 1 g capsule Take 1 capsule by mouth once daily 09/19/15  Yes Historical Provider, MD  ondansetron (ZOFRAN) 8 MG tablet Take 1 tablet (8 mg total) by mouth every 8 (eight) hours as needed for nausea or vomiting (Will not make drowsy.). 12/24/15  Yes Lennis Marion Downer, MD  PARoxetine (PAXIL) 20 MG tablet TAKE 1 TABLET BY MOUTH DAILY 06/03/15  Yes Camelia Eng Tysinger, PA-C  verapamil (CALAN-SR) 240 MG CR tablet Take 1 tablet (240 mg total) by mouth daily. 07/25/15  Yes Camelia Eng Tysinger, PA-C  LORazepam (ATIVAN) 0.5 MG tablet Place 1 tablet under the tongue or swallow every 6 hours as needed for nausea. Will make you drowsy. Patient not taking: Reported on 02/20/2016 12/24/15   Gordy Levan, MD  prochlorperazine (COMPAZINE) 10 MG tablet Take 1 tablet (10 mg total) by mouth every 6 (six) hours as needed for nausea or vomiting. 12/26/15   Lennis P Livesay, MD   BP 106/66 mmHg  Pulse 56  Temp(Src) 97.9 F (36.6 C) (Oral)  Resp 15  SpO2 100% Physical Exam  Constitutional: She is oriented to person, place, and time. She appears well-developed and well-nourished.  Non-toxic appearance. She does not have a sickly appearance. She does not appear ill.  HENT:  Head: Normocephalic and atraumatic.  Mouth/Throat: Oropharynx is clear and moist.  Eyes: Conjunctivae are normal. Pupils are equal, round, and reactive to light.  Neck: Normal range of motion. Neck supple.  Cardiovascular: Normal rate, regular rhythm and normal heart sounds.   No murmur heard. Pulmonary/Chest: Effort normal and breath sounds normal. No accessory muscle usage or stridor. No respiratory distress. She has no wheezes. She has no rhonchi. She has no rales.  Abdominal: Soft. Bowel sounds are normal. She exhibits no distension. There is no tenderness.  Musculoskeletal: Normal range of motion.  Lymphadenopathy:    She has no cervical adenopathy.   Neurological: She is alert and oriented to person, place, and time.  Mental Status:   AOx3.  Speech with stutter, baseline Cranial Nerves:  I-not tested  II-PERRLA  III, IV, VI-EOMs intact  V-temporal and masseter strength intact.  Normal sensation to light touch in all branches of trigeminal nerve.  VII-symmetrical facial movements intact, no facial droop.    VIII-hearing grossly intact bilaterally  IX, X-gag intact  XI-strength of sternomastoid and trapezius muscles 5/5  XII-tongue midline Motor:   Good muscle bulk and tone  Decreased strength on right, baseline.  Good strength on left in upper and lower extremities.    Sensory:  Intact in upper and lower extremities   Skin: Skin is warm and dry.  Psychiatric: She has a normal mood and affect. Her behavior  is normal.    ED Course  Procedures (including critical care time) Labs Review Labs Reviewed  CBC WITH DIFFERENTIAL/PLATELET - Abnormal; Notable for the following:    RBC 3.05 (*)    Hemoglobin 9.6 (*)    HCT 28.6 (*)    RDW 20.3 (*)    All other components within normal limits  COMPREHENSIVE METABOLIC PANEL - Abnormal; Notable for the following:    Glucose, Bld 114 (*)    Total Protein 6.1 (*)    Albumin 3.3 (*)    All other components within normal limits    Imaging Review Mr Kizzie Fantasia Contrast  02/20/2016  CLINICAL DATA:  65 year old hypertensive female with episode of vomiting. Weakness and dizziness. History of stroke. Stomach cancer post chemotherapy 4 days ago. Breast cancer. Subsequent encounter. EXAM: MRI HEAD WITHOUT AND WITH CONTRAST TECHNIQUE: Multiplanar, multiecho pulse sequences of the brain and surrounding structures were obtained without and with intravenous contrast. CONTRAST:  37mL MULTIHANCE GADOBENATE DIMEGLUMINE 529 MG/ML IV SOLN COMPARISON:  01/27/2016 head CT. FINDINGS: No acute infarct. Remote large left middle cerebral artery distribution infarct with broad area of encephalomalacia with  laminar necrosis/blood breakdown products. Subsequent mild dilation left lateral ventricle and wallerian degeneration. No intracranial mass. Moderate small vessel disease changes. Global atrophy without hydrocephalus. Expanded partially empty sella without secondary findings of pseudotumor cerebri. Cervical medullary junction unremarkable. Major intracranial vascular structures are patent. Minimal mucosal thickening ethmoid sinus air cells. Small air-fluid levels maxillary sinuses bilaterally may indicate changes of acute sinusitis. IMPRESSION: No acute infarct. 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 No intracranial mass. Moderate small vessel disease changes. Global atrophy without hydrocephalus. Expanded partially empty sella without secondary findings of pseudotumor cerebri. Minimal mucosal thickening ethmoid sinus air cells. Small air-fluid levels maxillary sinuses bilaterally may indicate changes of acute sinusitis. Electronically Signed   By: Genia Del M.D.   On: 02/20/2016 15:16   I have personally reviewed and evaluated these images and lab results as part of my medical decision-making.   EKG Interpretation None      ED ECG REPORT   Date: 02/20/2016  Rate: 54  Rhythm: atrial fibrillation  QRS Axis: normal  Intervals: normal  ST/T Wave abnormalities: normal  Conduction Disutrbances:none  Narrative Interpretation:   Old EKG Reviewed: unchanged  I have personally reviewed the EKG tracing and agree with the computerized printout as noted.   MDM   Final diagnoses:  Spell of shaking  Non-intractable vomiting without nausea, vomiting of unspecified type   Patient with episode of right arm and head shaking and unresponsiveness.  Seen 2/20 for similar presentation; negative head CT.  Vitals today show mild hypotension 104/64, after chart review it appears she typically runs 120-130/70-80.  She is currently asymptomatic.  Hx of CVA with right sided deficits and  stutter.  Currently being treated for gynecologic cancer with chemo every Friday.  Today, EKG shows atrial fibrillation, this appears to be chronic.  She is anticoagulated on Eliquis.  She has right sided deficits and stuttered speech; otherwise normal neurological exam.  Will obtain basic labs and consult neurology.   1:40 PM: Spoke with neurology, Dr. Silverio Decamp, will obtain brain MR w/wo contrast.  Plan to call with results to establish appropriate plan.   Brain MR negative for acute abnormalities.  Labs without acute abnormalities.  3:30 PM: Spoke with Dr. Silverio Decamp to establish appropriate disposition.  Patient will follow up outpatient with neurology for EEG and subsequent neurological evaluation.  Patient  may take BZD for symptom control.  Patient is prescribed 0.25 mg xanax PRN for anxiety, I discussed that the patient may take this when she begins to experience these symptoms to stop the shaking.  Patient's family members disclose that she in fact, has a neurology appointment next week with [facility]. Discussed return precautions.  Patient and family members agree and acknowledge the above plan for discharge.  Case has been discussed with and seen by Dr. Alvino Chapel who agrees with the above plan for discharge.   Gloriann Loan, PA-C 02/20/16 1541  Gloriann Loan, PA-C 02/20/16 1545  Davonna Belling, MD 02/21/16 [phone]

## 2016-02-20 NOTE — ED Notes (Addendum)
Daughter reports that pt's boyfriend told her that they called the ambulance because pt was "shaking and no responding to him".  Pt has no memory of this.

## 2016-02-21 ENCOUNTER — Other Ambulatory Visit: Payer: Medicare Other

## 2016-02-21 ENCOUNTER — Other Ambulatory Visit: Payer: Self-pay | Admitting: Oncology

## 2016-02-21 ENCOUNTER — Telehealth: Payer: Self-pay

## 2016-02-21 ENCOUNTER — Ambulatory Visit (HOSPITAL_BASED_OUTPATIENT_CLINIC_OR_DEPARTMENT_OTHER): Payer: Medicare Other

## 2016-02-21 VITALS — BP 116/77 | HR 100 | Temp 97.1°F | Resp 16

## 2016-02-21 DIAGNOSIS — C561 Malignant neoplasm of right ovary: Secondary | ICD-10-CM

## 2016-02-21 DIAGNOSIS — Z5111 Encounter for antineoplastic chemotherapy: Secondary | ICD-10-CM

## 2016-02-21 DIAGNOSIS — C562 Malignant neoplasm of left ovary: Secondary | ICD-10-CM

## 2016-02-21 DIAGNOSIS — C563 Malignant neoplasm of bilateral ovaries: Secondary | ICD-10-CM

## 2016-02-21 MED ORDER — DIPHENHYDRAMINE HCL 50 MG/ML IJ SOLN
25.0000 mg | Freq: Once | INTRAMUSCULAR | Status: AC
  Administered 2016-02-21: 25 mg via INTRAVENOUS

## 2016-02-21 MED ORDER — SODIUM CHLORIDE 0.9% FLUSH
10.0000 mL | INTRAVENOUS | Status: DC | PRN
  Administered 2016-02-21: 10 mL via INTRAVENOUS
  Filled 2016-02-21: qty 10

## 2016-02-21 MED ORDER — FAMOTIDINE IN NACL 20-0.9 MG/50ML-% IV SOLN
INTRAVENOUS | Status: AC
  Filled 2016-02-21: qty 50

## 2016-02-21 MED ORDER — PACLITAXEL CHEMO INJECTION 300 MG/50ML
80.0000 mg/m2 | Freq: Once | INTRAVENOUS | Status: AC
  Administered 2016-02-21: 162 mg via INTRAVENOUS
  Filled 2016-02-21: qty 27

## 2016-02-21 MED ORDER — SODIUM CHLORIDE 0.9 % IV SOLN
Freq: Once | INTRAVENOUS | Status: AC
  Administered 2016-02-21: 11:00:00 via INTRAVENOUS
  Filled 2016-02-21: qty 4

## 2016-02-21 MED ORDER — SODIUM CHLORIDE 0.9 % IV SOLN
Freq: Once | INTRAVENOUS | Status: AC
  Administered 2016-02-21: 11:00:00 via INTRAVENOUS

## 2016-02-21 MED ORDER — DIPHENHYDRAMINE HCL 50 MG/ML IJ SOLN
INTRAMUSCULAR | Status: AC
  Filled 2016-02-21: qty 1

## 2016-02-21 MED ORDER — HEPARIN SOD (PORK) LOCK FLUSH 100 UNIT/ML IV SOLN
500.0000 [IU] | Freq: Once | INTRAVENOUS | Status: AC
  Administered 2016-02-21: 500 [IU] via INTRAVENOUS
  Filled 2016-02-21: qty 5

## 2016-02-21 MED ORDER — FAMOTIDINE IN NACL 20-0.9 MG/50ML-% IV SOLN
20.0000 mg | Freq: Once | INTRAVENOUS | Status: AC
  Administered 2016-02-21: 20 mg via INTRAVENOUS

## 2016-02-21 MED ORDER — SODIUM CHLORIDE 0.9 % IV SOLN: 20.0000 mg | Freq: Once | INTRAVENOUS | Status: DC

## 2016-02-21 MED ORDER — SODIUM CHLORIDE 0.9 % IV SOLN
Freq: Once | INTRAVENOUS | Status: DC
  Filled 2016-02-21: qty 4

## 2016-02-21 NOTE — Progress Notes (Signed)
Ok to treat per Dr. Marko Plume despite ED trip yesterday.  Ok to use labs from ED visit.  Pt feeling well, VSS, no distress.

## 2016-02-21 NOTE — Telephone Encounter (Signed)
Brenda Cunningham' neurologist is Dr. Maple Hudson O:[phone]. Fax: [phone]. Faxed ED notes from 01-09-16 and 03-01-16, labs from 02-20-16, MRI of head 02-19-17 and Ct head from 01-27-16, and Dr. Mariana Kaufman note from 02-03-16 for appointment 03-03-16 at 0930 as requested by Dr. Marko Plume.

## 2016-02-21 NOTE — Patient Instructions (Signed)
[facility] Discharge Instructions for Patients Receiving Chemotherapy  Today you received the following chemotherapy agents taxol  To help prevent nausea and vomiting after your treatment, we encourage you to take your nausea medication as directed   If you develop nausea and vomiting that is not controlled by your nausea medication, call the clinic.   BELOW ARE SYMPTOMS THAT SHOULD BE REPORTED IMMEDIATELY:  *FEVER GREATER THAN 100.5 F  *CHILLS WITH OR WITHOUT FEVER  NAUSEA AND VOMITING THAT IS NOT CONTROLLED WITH YOUR NAUSEA MEDICATION  *UNUSUAL SHORTNESS OF BREATH  *UNUSUAL BRUISING OR BLEEDING  TENDERNESS IN MOUTH AND THROAT WITH OR WITHOUT PRESENCE OF ULCERS  *URINARY PROBLEMS  *BOWEL PROBLEMS  UNUSUAL RASH Items with * indicate a potential emergency and should be followed up as soon as possible.  Feel free to call the clinic you have any questions or concerns. The clinic phone number is [phone].  

## 2016-02-22 ENCOUNTER — Ambulatory Visit (HOSPITAL_BASED_OUTPATIENT_CLINIC_OR_DEPARTMENT_OTHER): Payer: Medicare Other

## 2016-02-22 VITALS — BP 121/72 | HR 100 | Temp 98.8°F | Resp 18

## 2016-02-22 DIAGNOSIS — C774 Secondary and unspecified malignant neoplasm of inguinal and lower limb lymph nodes: Secondary | ICD-10-CM | POA: Diagnosis not present

## 2016-02-22 DIAGNOSIS — C561 Malignant neoplasm of right ovary: Secondary | ICD-10-CM

## 2016-02-22 DIAGNOSIS — D701 Agranulocytosis secondary to cancer chemotherapy: Secondary | ICD-10-CM

## 2016-02-22 DIAGNOSIS — C562 Malignant neoplasm of left ovary: Principal | ICD-10-CM

## 2016-02-22 DIAGNOSIS — C563 Malignant neoplasm of bilateral ovaries: Secondary | ICD-10-CM

## 2016-02-22 DIAGNOSIS — C801 Malignant (primary) neoplasm, unspecified: Secondary | ICD-10-CM | POA: Diagnosis not present

## 2016-02-22 MED ORDER — TBO-FILGRASTIM 480 MCG/0.8ML ~~LOC~~ SOSY
480.0000 ug | PREFILLED_SYRINGE | Freq: Once | SUBCUTANEOUS | Status: AC
  Administered 2016-02-22: 480 ug via SUBCUTANEOUS

## 2016-02-22 NOTE — Patient Instructions (Signed)

## 2016-02-25 DIAGNOSIS — H2513 Age-related nuclear cataract, bilateral: Secondary | ICD-10-CM | POA: Diagnosis not present

## 2016-02-25 DIAGNOSIS — H40013 Open angle with borderline findings, low risk, bilateral: Secondary | ICD-10-CM | POA: Diagnosis not present

## 2016-02-28 ENCOUNTER — Ambulatory Visit (HOSPITAL_BASED_OUTPATIENT_CLINIC_OR_DEPARTMENT_OTHER): Payer: Medicare Other

## 2016-02-28 ENCOUNTER — Telehealth: Payer: Self-pay

## 2016-02-28 ENCOUNTER — Other Ambulatory Visit (HOSPITAL_BASED_OUTPATIENT_CLINIC_OR_DEPARTMENT_OTHER): Payer: Medicare Other

## 2016-02-28 ENCOUNTER — Ambulatory Visit: Payer: Medicare Other

## 2016-02-28 ENCOUNTER — Other Ambulatory Visit: Payer: Self-pay | Admitting: Oncology

## 2016-02-28 DIAGNOSIS — C562 Malignant neoplasm of left ovary: Principal | ICD-10-CM

## 2016-02-28 DIAGNOSIS — C563 Malignant neoplasm of bilateral ovaries: Secondary | ICD-10-CM

## 2016-02-28 DIAGNOSIS — Z5111 Encounter for antineoplastic chemotherapy: Secondary | ICD-10-CM | POA: Diagnosis present

## 2016-02-28 DIAGNOSIS — C561 Malignant neoplasm of right ovary: Secondary | ICD-10-CM | POA: Diagnosis not present

## 2016-02-28 DIAGNOSIS — Z95828 Presence of other vascular implants and grafts: Secondary | ICD-10-CM

## 2016-02-28 DIAGNOSIS — C800 Disseminated malignant neoplasm, unspecified: Secondary | ICD-10-CM

## 2016-02-28 LAB — COMPREHENSIVE METABOLIC PANEL
ALT: 44 U/L (ref 0–55)
AST: 20 U/L (ref 5–34)
Albumin: 3.3 g/dL — ABNORMAL LOW (ref 3.5–5.0)
Alkaline Phosphatase: 80 U/L (ref 40–150)
Anion Gap: 10 mEq/L (ref 3–11)
BUN: 11.4 mg/dL (ref 7.0–26.0)
CO2: 22 mEq/L (ref 22–29)
Calcium: 9.1 mg/dL (ref 8.4–10.4)
Chloride: 105 mEq/L (ref 98–109)
Creatinine: 0.8 mg/dL (ref 0.6–1.1)
EGFR: >90 mL/min/{1.73_m2} (ref >90)
Glucose: 188 mg/dl — ABNORMAL HIGH (ref 70–140)
Potassium: 4.1 mEq/L (ref 3.5–5.1)
Sodium: 137 mEq/L (ref 136–145)
Total Bilirubin: 0.8 mg/dL (ref 0.20–1.20)
Total Protein: 6.3 g/dL — ABNORMAL LOW (ref 6.4–8.3)

## 2016-02-28 LAB — CBC WITH DIFFERENTIAL/PLATELET
BASO%: 0.3 % (ref 0.0–2.0)
Basophils Absolute: 0 10*3/uL (ref 0.0–0.1)
EOS%: 0 % (ref 0.0–7.0)
Eosinophils Absolute: 0 10*3/uL (ref 0.0–0.5)
HCT: 28 % — ABNORMAL LOW (ref 34.8–46.6)
HGB: 9.3 g/dL — ABNORMAL LOW (ref 11.6–15.9)
LYMPH%: 22.8 % (ref 14.0–49.7)
MCH: 31.9 pg (ref 25.1–34.0)
MCHC: 33 g/dL (ref 31.5–36.0)
MCV: 96.5 fL (ref 79.5–101.0)
MONO#: 0 10*3/uL — ABNORMAL LOW (ref 0.1–0.9)
MONO%: 0.4 % (ref 0.0–14.0)
NEUT#: 3.4 10*3/uL (ref 1.5–6.5)
NEUT%: 76.5 % (ref 38.4–76.8)
Platelets: 230 10*3/uL (ref 145–400)
RBC: 2.9 10*6/uL — ABNORMAL LOW (ref 3.70–5.45)
RDW: 21.9 % — ABNORMAL HIGH (ref 11.2–14.5)
WBC: 4.4 10*3/uL (ref 3.9–10.3)
lymph#: 1 10*3/uL (ref 0.9–3.3)

## 2016-02-28 MED ORDER — SODIUM CHLORIDE 0.9 % IV SOLN
Freq: Once | INTRAVENOUS | Status: DC
  Filled 2016-02-28: qty 4

## 2016-02-28 MED ORDER — SODIUM CHLORIDE 0.9 % IV SOLN
20.0000 mg | Freq: Once | INTRAVENOUS | Status: AC
  Administered 2016-02-28: 20 mg via INTRAVENOUS
  Filled 2016-02-28: qty 2

## 2016-02-28 MED ORDER — SODIUM CHLORIDE 0.9 % IV SOLN
Freq: Once | INTRAVENOUS | Status: AC
  Administered 2016-02-28: 10:00:00 via INTRAVENOUS

## 2016-02-28 MED ORDER — SODIUM CHLORIDE 0.9% FLUSH
10.0000 mL | INTRAVENOUS | Status: DC | PRN
  Administered 2016-02-28: 10 mL via INTRAVENOUS
  Filled 2016-02-28: qty 10

## 2016-02-28 MED ORDER — PACLITAXEL CHEMO INJECTION 300 MG/50ML
80.0000 mg/m2 | Freq: Once | INTRAVENOUS | Status: AC
  Administered 2016-02-28: 162 mg via INTRAVENOUS
  Filled 2016-02-28: qty 27

## 2016-02-28 MED ORDER — DIPHENHYDRAMINE HCL 50 MG/ML IJ SOLN
25.0000 mg | Freq: Once | INTRAMUSCULAR | Status: AC
  Administered 2016-02-28: 25 mg via INTRAVENOUS

## 2016-02-28 MED ORDER — FAMOTIDINE IN NACL 20-0.9 MG/50ML-% IV SOLN
20.0000 mg | Freq: Once | INTRAVENOUS | Status: AC
  Administered 2016-02-28: 20 mg via INTRAVENOUS

## 2016-02-28 MED ORDER — FAMOTIDINE IN NACL 20-0.9 MG/50ML-% IV SOLN
INTRAVENOUS | Status: AC
  Filled 2016-02-28: qty 50

## 2016-02-28 MED ORDER — HEPARIN SOD (PORK) LOCK FLUSH 100 UNIT/ML IV SOLN
500.0000 [IU] | Freq: Once | INTRAVENOUS | Status: AC
  Administered 2016-02-28: 500 [IU] via INTRAVENOUS
  Filled 2016-02-28: qty 5

## 2016-02-28 MED ORDER — DIPHENHYDRAMINE HCL 50 MG/ML IJ SOLN
INTRAMUSCULAR | Status: AC
Start: 2016-02-28 — End: 2016-02-28
  Filled 2016-02-28: qty 1

## 2016-02-28 NOTE — Telephone Encounter (Signed)
Family called requesting w/c for the pt b/c she is getting weaker.  S/w Dr Edwyna Shell and she said is was OK Prepared prescription and LVM with family asking what store or company they want the Rx faxed to.

## 2016-02-28 NOTE — Telephone Encounter (Signed)
S/w [name] and [facility] [facility] is where she wants the rx sent. Done. Kenney Houseman knows to go by store to pick up wheelchair

## 2016-02-28 NOTE — Patient Instructions (Signed)

## 2016-02-28 NOTE — Patient Instructions (Signed)
[facility] Discharge Instructions for Patients Receiving Chemotherapy  Today you received the following chemotherapy agents Taxol  To help prevent nausea and vomiting after your treatment, we encourage you to take your nausea medication as needed   If you develop nausea and vomiting that is not controlled by your nausea medication, call the clinic.   BELOW ARE SYMPTOMS THAT SHOULD BE REPORTED IMMEDIATELY:  *FEVER GREATER THAN 100.5 F  *CHILLS WITH OR WITHOUT FEVER  NAUSEA AND VOMITING THAT IS NOT CONTROLLED WITH YOUR NAUSEA MEDICATION  *UNUSUAL SHORTNESS OF BREATH  *UNUSUAL BRUISING OR BLEEDING  TENDERNESS IN MOUTH AND THROAT WITH OR WITHOUT PRESENCE OF ULCERS  *URINARY PROBLEMS  *BOWEL PROBLEMS  UNUSUAL RASH Items with * indicate a potential emergency and should be followed up as soon as possible.  Feel free to call the clinic you have any questions or concerns. The clinic phone number is [phone].  Please show the CHEMO ALERT CARD at check-in to the Emergency Department and triage nurse.   

## 2016-02-29 ENCOUNTER — Ambulatory Visit (HOSPITAL_BASED_OUTPATIENT_CLINIC_OR_DEPARTMENT_OTHER): Payer: Medicare Other

## 2016-02-29 VITALS — BP 118/71 | HR 80 | Temp 98.0°F | Resp 18 | Ht 68.5 in

## 2016-02-29 DIAGNOSIS — C562 Malignant neoplasm of left ovary: Secondary | ICD-10-CM | POA: Diagnosis not present

## 2016-02-29 DIAGNOSIS — C561 Malignant neoplasm of right ovary: Secondary | ICD-10-CM

## 2016-02-29 DIAGNOSIS — C563 Malignant neoplasm of bilateral ovaries: Secondary | ICD-10-CM

## 2016-02-29 LAB — CA 125: Cancer Antigen (CA) 125: 33.6 U/mL (ref 0.0–38.1)

## 2016-02-29 MED ORDER — TBO-FILGRASTIM 480 MCG/0.8ML ~~LOC~~ SOSY
480.0000 ug | PREFILLED_SYRINGE | Freq: Once | SUBCUTANEOUS | Status: AC
  Administered 2016-02-29: 480 ug via SUBCUTANEOUS

## 2016-02-29 NOTE — Patient Instructions (Signed)

## 2016-03-02 ENCOUNTER — Other Ambulatory Visit: Payer: Self-pay | Admitting: Oncology

## 2016-03-02 ENCOUNTER — Ambulatory Visit (HOSPITAL_COMMUNITY)
Admission: RE | Admit: 2016-03-02 | Discharge: 2016-03-02 | Disposition: A | Discharge status: Home / Self Care | Payer: Medicare Other | Source: Ambulatory Visit | Attending: Oncology | Admitting: Oncology

## 2016-03-02 DIAGNOSIS — Z9221 Personal history of antineoplastic chemotherapy: Secondary | ICD-10-CM | POA: Insufficient documentation

## 2016-03-02 DIAGNOSIS — C800 Disseminated malignant neoplasm, unspecified: Secondary | ICD-10-CM | POA: Diagnosis not present

## 2016-03-02 DIAGNOSIS — K7689 Other specified diseases of liver: Secondary | ICD-10-CM | POA: Diagnosis not present

## 2016-03-02 DIAGNOSIS — R918 Other nonspecific abnormal finding of lung field: Secondary | ICD-10-CM | POA: Insufficient documentation

## 2016-03-02 DIAGNOSIS — K769 Liver disease, unspecified: Secondary | ICD-10-CM | POA: Diagnosis not present

## 2016-03-02 MED ORDER — IOPAMIDOL (ISOVUE-300) INJECTION 61%
100.0000 mL | Freq: Once | INTRAVENOUS | Status: AC | PRN
  Administered 2016-03-02: 100 mL via INTRAVENOUS

## 2016-03-02 MED ORDER — IOHEXOL 300 MG/ML  SOLN: 50.0000 mL | Freq: Once | INTRAMUSCULAR | Status: DC | PRN

## 2016-03-03 DIAGNOSIS — I6930 Unspecified sequelae of cerebral infarction: Secondary | ICD-10-CM | POA: Diagnosis not present

## 2016-03-03 DIAGNOSIS — I63512 Cerebral infarction due to unspecified occlusion or stenosis of left middle cerebral artery: Secondary | ICD-10-CM | POA: Diagnosis not present

## 2016-03-03 DIAGNOSIS — R569 Unspecified convulsions: Secondary | ICD-10-CM | POA: Diagnosis not present

## 2016-03-04 ENCOUNTER — Telehealth: Payer: Self-pay | Admitting: *Deleted

## 2016-03-04 ENCOUNTER — Other Ambulatory Visit: Payer: Self-pay | Admitting: Medical

## 2016-03-04 ENCOUNTER — Other Ambulatory Visit: Payer: Self-pay | Admitting: *Deleted

## 2016-03-04 NOTE — Telephone Encounter (Signed)
Is this ok to refill?  

## 2016-03-04 NOTE — Telephone Encounter (Signed)
FYI [place] documents routed to [employer] with [place] as ordered. Called daughter with Dr. Roselie Awkward orders.  Per Kenney Houseman, her mom was "seen by neurologist yesterday.  CT scan of head has been ordered for March 17, 2016.  She also is waiting for a call for referral for home PT.  I really do not think this is another stroke because the weakness is on another level.  Wanted to make sure the chemoterapy isn't the cause.  Mom now changing the story saying the 'pain is moving within her calves' and earlier she said no pain."  Advised again to go to the ED as Time is important in the event of a CVA.

## 2016-03-04 NOTE — Telephone Encounter (Signed)
Re weakness  Nothing on CT chest abd pelvis this week to cause increased weakness.  I am worried about another CVA or the anticoagulation with bleeding Family should AT LEAST call her neurologist TODAY, or really best would be to go to ER as she may need head scan. Please fax my last note and reports of CT CAP to the neurologist in [place] (name in my office note)  Thank you

## 2016-03-04 NOTE — Telephone Encounter (Signed)
"  I'm calling about my mom who for the past week and a half has extremely weak legs.  Not walking on her own.  We're holding her up.  This is a reason to call per her chemotherapy guidelines.  Last chemo (Taxol/Carbo) was 02-28-2016.:   "Denies pain.  Feet swollen yesterday, elevated over night are better today.  She moves around a lot.  Has a broken area on her rear I put cream on." Reports history of stroke August 2016 but no signs of stroke per dtr [name].  Advised she go to ED F.A.S.T. If changes in face, arms or speech.  Will notify dr. Marko Plume who they are aware returns tomorrow.

## 2016-03-05 DIAGNOSIS — I69351 Hemiplegia and hemiparesis following cerebral infarction affecting right dominant side: Secondary | ICD-10-CM | POA: Diagnosis not present

## 2016-03-05 DIAGNOSIS — I10 Essential (primary) hypertension: Secondary | ICD-10-CM | POA: Diagnosis not present

## 2016-03-05 DIAGNOSIS — C50919 Malignant neoplasm of unspecified site of unspecified female breast: Secondary | ICD-10-CM | POA: Diagnosis not present

## 2016-03-05 DIAGNOSIS — R2689 Other abnormalities of gait and mobility: Secondary | ICD-10-CM | POA: Diagnosis not present

## 2016-03-05 DIAGNOSIS — F419 Anxiety disorder, unspecified: Secondary | ICD-10-CM | POA: Diagnosis not present

## 2016-03-05 DIAGNOSIS — I4891 Unspecified atrial fibrillation: Secondary | ICD-10-CM | POA: Diagnosis not present

## 2016-03-05 DIAGNOSIS — F329 Major depressive disorder, single episode, unspecified: Secondary | ICD-10-CM | POA: Diagnosis not present

## 2016-03-09 ENCOUNTER — Encounter: Payer: Self-pay | Admitting: Gynecologic Oncology

## 2016-03-09 ENCOUNTER — Ambulatory Visit: Payer: Medicare Other | Attending: Gynecologic Oncology | Admitting: Gynecologic Oncology

## 2016-03-09 VITALS — BP 103/61 | HR 64 | Temp 97.9°F | Resp 18 | Ht 68.5 in | Wt 182.9 lb

## 2016-03-09 DIAGNOSIS — R531 Weakness: Secondary | ICD-10-CM | POA: Insufficient documentation

## 2016-03-09 DIAGNOSIS — I4891 Unspecified atrial fibrillation: Secondary | ICD-10-CM | POA: Diagnosis not present

## 2016-03-09 DIAGNOSIS — Z853 Personal history of malignant neoplasm of breast: Secondary | ICD-10-CM | POA: Diagnosis not present

## 2016-03-09 DIAGNOSIS — Z7982 Long term (current) use of aspirin: Secondary | ICD-10-CM | POA: Diagnosis not present

## 2016-03-09 DIAGNOSIS — Z7901 Long term (current) use of anticoagulants: Secondary | ICD-10-CM | POA: Insufficient documentation

## 2016-03-09 DIAGNOSIS — C569 Malignant neoplasm of unspecified ovary: Secondary | ICD-10-CM | POA: Insufficient documentation

## 2016-03-09 DIAGNOSIS — E785 Hyperlipidemia, unspecified: Secondary | ICD-10-CM | POA: Insufficient documentation

## 2016-03-09 DIAGNOSIS — I10 Essential (primary) hypertension: Secondary | ICD-10-CM | POA: Insufficient documentation

## 2016-03-09 DIAGNOSIS — Z8673 Personal history of transient ischemic attack (TIA), and cerebral infarction without residual deficits: Secondary | ICD-10-CM | POA: Diagnosis not present

## 2016-03-09 DIAGNOSIS — M1990 Unspecified osteoarthritis, unspecified site: Secondary | ICD-10-CM | POA: Insufficient documentation

## 2016-03-09 DIAGNOSIS — F419 Anxiety disorder, unspecified: Secondary | ICD-10-CM | POA: Diagnosis not present

## 2016-03-09 DIAGNOSIS — Z9221 Personal history of antineoplastic chemotherapy: Secondary | ICD-10-CM | POA: Diagnosis not present

## 2016-03-09 NOTE — Patient Instructions (Addendum)
Preparing for your Surgery  Plan for surgery on April 18 with Dr. Everitt Amber.  You will be scheduled for a robotic assisted total hysterectomy, bilateral salpingo-oophorectomy, omentectomy, debulking, possible laparotomy (open incision).  STAY ON YOUR BABY ASPRIN BUT STOP TAKING ELIQUIS ON March 21, 2016 (Last dose April 14, Friday).  ALSO YOU WILL NEED TO DRINK TWO BOTTLES OF MAGNESIUM CITRATE STARTING AT 10 AM THE DAY BEFORE SURGERY.  Pre-operative Testing -You will receive a phone call from presurgical testing at [facility] to arrange for a pre-operative testing appointment before your surgery.  This appointment normally occurs one to two weeks before your scheduled surgery.   -Bring your insurance card, copy of an advanced directive if applicable, medication list  -At that visit, you will be asked to sign a consent for a possible blood transfusion in case a transfusion becomes necessary during surgery.  The need for a blood transfusion is rare but having consent is a necessary part of your care.     Day Before Surgery at [place] will be asked to take in only clear liquids the day before surgery.  Examples of clear liquids include broths, jello, and clear juices.  Avoid carbonated beverages.  You will be advised to have nothing to eat or drink after midnight the evening before.  You will need to drink two bottles of magnesium citrate the day before surgery.   Your role in recovery Your role is to become active as soon as directed by your doctor, while still giving yourself time to heal.  Rest when you feel tired. You will be asked to do the following in order to speed your recovery:  - Cough and breathe deeply. This helps toclear and expand your lungs and can prevent pneumonia. You may be given a spirometer to practice deep breathing. A staff member will show you how to use the spirometer. - Do mild physical activity. Walking or moving your legs help your circulation  and body functions return to normal. A staff member will help you when you try to walk and will provide you with simple exercises. Do not try to get up or walk alone the first time. - Actively manage your pain. Managing your pain lets you move in comfort. We will ask you to rate your pain on a scale of zero to 10. It is your responsibility to tell your doctor or nurse where and how much you hurt so your pain can be treated.  Special Considerations -If you are diabetic, you may be placed on insulin after surgery to have closer control over your blood sugars to promote healing and recovery.  This does not mean that you will be discharged on insulin.  If applicable, your oral antidiabetics will be resumed when you are tolerating a solid diet.  -Your final pathology results from surgery should be available by the Friday after surgery and the results will be relayed to you when available.  Blood Transfusion Information WHAT IS A BLOOD TRANSFUSION? A transfusion is the replacement of blood or some of its parts. Blood is made up of multiple cells which provide different functions.  Red blood cells carry oxygen and are used for blood loss replacement.  White blood cells fight against infection.  Platelets control bleeding.  Plasma helps clot blood.  Other blood products are available for specialized needs, such as hemophilia or other clotting disorders. BEFORE THE TRANSFUSION  Who gives blood for transfusions?   You may be able to donate  blood to be used at a later date on yourself (autologous donation).  Relatives can be asked to donate blood. This is generally not any safer than if you have received blood from a stranger. The same precautions are taken to ensure safety when a relative's blood is donated.  Healthy volunteers who are fully evaluated to make sure their blood is safe. This is blood bank blood. Transfusion therapy is the safest it has ever been in the practice of medicine. Before  blood is taken from a donor, a complete history is taken to make sure that person has no history of diseases nor engages in risky social behavior (examples are intravenous drug use or sexual activity with multiple partners). The donor's travel history is screened to minimize risk of transmitting infections, such as malaria. The donated blood is tested for signs of infectious diseases, such as HIV and hepatitis. The blood is then tested to be sure it is compatible with you in order to minimize the chance of a transfusion reaction. If you or a relative donates blood, this is often done in anticipation of surgery and is not appropriate for emergency situations. It takes many days to process the donated blood. RISKS AND COMPLICATIONS Although transfusion therapy is very safe and saves many lives, the main dangers of transfusion include:   Getting an infectious disease.  Developing a transfusion reaction. This is an allergic reaction to something in the blood you were given. Every precaution is taken to prevent this. The decision to have a blood transfusion has been considered carefully by your caregiver before blood is given. Blood is not given unless the benefits outweigh the risks.

## 2016-03-09 NOTE — Progress Notes (Signed)
Ovarian Cancer Follow-up Note  Consult was initially requested by Dr. Venora Maples for the evaluation of Brenda Cunningham 65 y.o. female with presumed metastatic ovarian cancer  CC:  Chief Complaint  Patient presents with  . Ovarian Cancer    MD follow up visit    Assessment/Plan:  Brenda Cunningham  is a 65 y.o.  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  Good response to therapy on CT findings and CA 125 (normalized). Progressive neurologic phenomena (possible seizure, weakness LE's).   I am recommending interval cytoreduction. Based on my evaluation of the CT images, I believe she is a good candidate for a minimally invasive approach. We will schedule the surgery as a robotic assisted total hysterectomy, BSO, omentectomy, tumor debulking, possible laparotomy.  A minimally invasive approach may result in less recovery and limitations postop in this patient who has neurologic impairments.  I am recommending at least 72 hours off eloquis given the extensive nature of her surgery. I discussed operative risks with the patient and her daughter including bleeding complications (intra and postop), infection, damage to internal organs, conversion to laparotomy, VTE events, neurologic events (eg stroke), postop deconditioning requiring NH placement.  I discussed that the role of surgery is to debulk or reduce the volume of disease and is not able to completely resect all disease due to her diffusely metastatic picture. I explained that additional cycles of chemotherapy are essential after the surgery or no benefit will be derived.  HPI: Brenda Cunningham is a 65 year old G6P6 who is seen in consultation at the request of ED physician, Dr Jola Schmidt for carcinomatosis. The patient reports having a stroke in August 2016 that resulted in expressive aphasia and right upper extremity weakness.  She has been treated for that with  aspirin and L a close and is undergoing physical therapy and speech pathology.  The patient began developing vague left lower quadrant and hip discomfort and was seen in the emergency room on 11/29/2015. A CT scan of the abdomen and pelvis was ordered and revealed an indistinct uterus secondary to surrounding peritoneal masses. Internal coarse calcifications consistent with hyalinized fibroids. Bilateral ovaries enlarged and heterogeneous measuring up to 4 cm is on the right. Bilateral pelvic and inguinal adenopathy which appears malignant with a left inguinal lymph node measuring 1.8 mm and a right external iliac lymph node measuring 18 mm. The retroperitoneal adenopathy extensive pelvic brim. Innumerable masses clustered in the pelvis with interleukin peritoneal thickening and small ascites. The omentum has a started appearance.  The patient denies early satiety and reports mild and vague abdominal bloating. She has a remote history of breast cancer in 2008 for which she was treated with lumpectomy, chemotherapy, and radiation. She is no family history significant for breast or ovarian cancer. She is otherwise treated for atrial fibrillation and has had a remote history of TIAs in 2010. In 2016 she underwent more significant cerebrovascular event which resulted in expressive aphasia and right upper extremity weakness. She also has hypertension and arthritis. Her prior surgical history significant for a left breast lumpectomy, appendectomy, tubal ligation. She's had 6 vaginal deliveries in the past.  She denies vaginal bleeding or history of abnormal Pap smears.  Interval Hx: On 12/17/15 she underwent biopsy of the inguinal node which revealed high grade serous carcinoma. She commenced neoadjuvant chemotherapy on 12/27/15 with dose dense carboplatin and paclitaxel. Day 1 of cycle 3 was on  02/14/16.  After cycle 3 she began experiencing profound weakness in both lower extremities and requires maximal  assistance at home now with mobility. She was evaluated by her neurologist, Dr Dellis Filbert on 03/03/16 who felt that she may have focal seizure vs syncopal episode. She is scheduled for an EEG on 03/17/16.  CT of the chest abdo and pelvis on 03/02/16 showed excellent response to therapy with resolution of ascites, regression of previously noted peritoneal implants and regression of lymphadenopathy in the abdomen and pelvis. There is continued soft tissue thickening along the perineal surfaces with the largest residual implant being 1.2 x 2.3 cm in the central low anatomic pelvis, and a 1.3 x 0.9 cm lesion in the low left paracolic gutter. The ovaries measure for 0.0 x 2.6 x 4.7 cm on the right and 1.8 x 2.3 x 2.37 m on the left. The uterus is bulky and enlarged with fibroids.  On 02/28/16 CA 125 normalized to 33.6  Current Meds:  Outpatient Encounter Prescriptions as of 03/09/2016  Medication Sig  . acetaminophen (TYLENOL) 500 MG tablet Take 500-1,000 mg by mouth every 6 (six) hours as needed. Reported on 02/03/2016  . ALPRAZolam (XANAX) 0.25 MG tablet Take 1 tablet by mouth up to 2-3 times a day PRN anxiety  . apixaban (ELIQUIS) 5 MG TABS tablet Take 1 tablet (5 mg total) by mouth 2 (two) times daily.  Marland Kitchen aspirin EC 81 MG tablet Take 81 mg by mouth daily.  Marland Kitchen atorvastatin (LIPITOR) 40 MG tablet Take 40 mg by mouth at bedtime.  Marland Kitchen dexamethasone (DECADRON) 4 MG tablet Take 5 tablets =20 mg with food 12 hrs before Taxol Chemotherapy (Patient taking differently: Take 20 mg by mouth See admin instructions. Take 5 tablets =20 mg with food 12 hrs before Taxol Chemotherapy)  . lidocaine-prilocaine (EMLA) cream Apply to Porta-Cath 1-2 hours prior to access as directed. (Patient taking differently: Apply 1 application topically See admin instructions. Apply to Porta-Cath 1-2 hours prior to access as directed.)  . loratadine (CLARITIN) 10 MG tablet Take 10 mg by mouth daily as needed (Recommended for chemo pain).   .  LORazepam (ATIVAN) 0.5 MG tablet Place 1 tablet under the tongue or swallow every 6 hours as needed for nausea. Will make you drowsy.  . metoprolol (LOPRESSOR) 50 MG tablet Take 50 mg by mouth 2 (two) times daily.  . Multiple Vitamins-Minerals (MULTIVITAMIN WITH MINERALS) tablet Take 1 tablet by mouth daily.  Marland Kitchen neomycin-bacitracin-polymyxin (NEOSPORIN) ointment Place 1 application into the nose daily as needed. apply to each nostril once daily PRN if nose bleeds after blowing  . omega-3 acid ethyl esters (LOVAZA) 1 g capsule Take 1 capsule by mouth once daily  . ondansetron (ZOFRAN) 8 MG tablet Take 1 tablet (8 mg total) by mouth every 8 (eight) hours as needed for nausea or vomiting (Will not make drowsy.).  Marland Kitchen PARoxetine (PAXIL) 20 MG tablet TAKE 1 TABLET BY MOUTH DAILY  . prochlorperazine (COMPAZINE) 10 MG tablet Take 1 tablet (10 mg total) by mouth every 6 (six) hours as needed for nausea or vomiting.  . verapamil (CALAN-SR) 240 MG CR tablet Take 1 tablet (240 mg total) by mouth daily.   No facility-administered encounter medications on file as of 03/09/2016.    Allergy:  Allergies  Allergen Reactions  . Codeine Hives and Itching    Social Hx:   Social History   Social History  . Marital Status: Single    Spouse Name: N/A  . Number  of Children: N/A  . Years of Education: N/A   Occupational History  . Not on file.   Social History Main Topics  . Smoking status: Never Smoker   . Smokeless tobacco: Not on file  . Alcohol Use: No  . Drug Use: No  . Sexual Activity: Not on file   Other Topics Concern  . Not on file   Social History Narrative   Lives with her female friend, Mina Marble, walks for exercise.  Originally from [place].    Past Surgical Hx:  Past Surgical History  Procedure Laterality Date  . Breast lumpectomy  2008    left  . Appendectomy    . Tubal ligation    . Colonoscopy  2004    Past Medical Hx:  Past Medical History  Diagnosis Date  . Hypertension    . Arthritis   . A-fib ([place])   . Hyperlipidemia   . Stroke ([place]) 2010  . Breast cancer ([place]) 2008    s/p lumpectomy, chemo and radiation therapy  . Wears glasses   . Anxiety     Past Gynecological History:  SVD x 6  No LMP recorded. Patient is postmenopausal.  Family Hx:  Family History  Problem Relation Age of Onset  . Family history unknown: Yes    Review of Systems:  Constitutional  Feels well,    ENT Normal appearing ears and nares bilaterally Skin/Breast  No rash, sores, jaundice, itching, dryness Cardiovascular  No chest pain, shortness of breath, or edema  Pulmonary  No cough or wheeze.  Gastro Intestinal  No nausea, vomitting, or diarrhoea. No bright red blood per rectum, no abdominal pain, change in bowel movement, or constipation.  Genito Urinary  No frequency, urgency, dysuria,  Musculo Skeletal  Left chest wall discomfort Neurologic  No weakness, numbness, change in gait,  Psychology  No depression, anxiety, insomnia.   Vitals:  Blood pressure 103/61, pulse 64, temperature 97.9 F (36.6 C), temperature source Oral, resp. rate 18, height 5' 8.5" (1.74 m), weight 182 lb 14.4 oz (82.963 kg), SpO2 100 %.  Physical Exam: WD in NAD Neck  Supple NROM, without any enlargements.  Lymph Node Survey No cervical supraclavicular adenopathy. Left inguinal lymph node is no longer palpable Cardiovascular  Pulse normal rate, regularity and rhythm. S1 and S2 normal.  Lungs  Clear to auscultation bilateraly, without wheezes/crackles/rhonchi. Good air movement.  Skin  No rash/lesions/breakdown  Psychiatry  Alert and oriented to person, place, and time  Abdomen  Normoactive bowel sounds, abdomen soft, non-tender and overweight without evidence of hernia. Slightly distended. No masses. Back No CVA tenderness Genito Urinary  Vulva/vagina: Normal external female genitalia.  No lesions. No discharge or bleeding.  Bladder/urethra:  No lesions or masses, well  supported bladder  Vagina: nodule palpable behind the cervix.  Cervix: Normal appearing, no lesions.  Uterus: No dominant mass in pelvis.  Adnexa: as above . Rectal  Good tone, no masses no cul de sac nodularity.  Extremities  No bilateral cyanosis, clubbing or edema.  25 minutes of face to face counseling time was spent wit the patient Donaciano Eva, MD  03/09/2016, 5:35 PM

## 2016-03-10 DIAGNOSIS — I959 Hypotension, unspecified: Secondary | ICD-10-CM | POA: Diagnosis not present

## 2016-03-10 DIAGNOSIS — I4891 Unspecified atrial fibrillation: Secondary | ICD-10-CM | POA: Diagnosis not present

## 2016-03-10 DIAGNOSIS — Z993 Dependence on wheelchair: Secondary | ICD-10-CM | POA: Diagnosis not present

## 2016-03-10 DIAGNOSIS — I63512 Cerebral infarction due to unspecified occlusion or stenosis of left middle cerebral artery: Secondary | ICD-10-CM | POA: Diagnosis not present

## 2016-03-10 DIAGNOSIS — R2689 Other abnormalities of gait and mobility: Secondary | ICD-10-CM | POA: Diagnosis not present

## 2016-03-10 DIAGNOSIS — F329 Major depressive disorder, single episode, unspecified: Secondary | ICD-10-CM | POA: Diagnosis not present

## 2016-03-10 DIAGNOSIS — C50919 Malignant neoplasm of unspecified site of unspecified female breast: Secondary | ICD-10-CM | POA: Diagnosis not present

## 2016-03-10 DIAGNOSIS — I10 Essential (primary) hypertension: Secondary | ICD-10-CM | POA: Diagnosis not present

## 2016-03-10 DIAGNOSIS — M546 Pain in thoracic spine: Secondary | ICD-10-CM | POA: Diagnosis not present

## 2016-03-10 DIAGNOSIS — I69351 Hemiplegia and hemiparesis following cerebral infarction affecting right dominant side: Secondary | ICD-10-CM | POA: Diagnosis not present

## 2016-03-10 DIAGNOSIS — Z789 Other specified health status: Secondary | ICD-10-CM | POA: Diagnosis not present

## 2016-03-11 ENCOUNTER — Telehealth: Payer: Self-pay

## 2016-03-11 DIAGNOSIS — I4891 Unspecified atrial fibrillation: Secondary | ICD-10-CM | POA: Diagnosis not present

## 2016-03-11 DIAGNOSIS — I69351 Hemiplegia and hemiparesis following cerebral infarction affecting right dominant side: Secondary | ICD-10-CM | POA: Diagnosis not present

## 2016-03-11 DIAGNOSIS — R2689 Other abnormalities of gait and mobility: Secondary | ICD-10-CM | POA: Diagnosis not present

## 2016-03-11 DIAGNOSIS — F329 Major depressive disorder, single episode, unspecified: Secondary | ICD-10-CM | POA: Diagnosis not present

## 2016-03-11 DIAGNOSIS — I10 Essential (primary) hypertension: Secondary | ICD-10-CM | POA: Diagnosis not present

## 2016-03-11 DIAGNOSIS — C50919 Malignant neoplasm of unspecified site of unspecified female breast: Secondary | ICD-10-CM | POA: Diagnosis not present

## 2016-03-11 NOTE — Telephone Encounter (Signed)
Orders received form Joylene John, APNP to contact Dr Valetta Fuller (cardiology)  [facility]: [phone] and Dr Encarnacion Slates (neurology) [facility]: [phone] for medical clearance . Left a detailed message with both providers requesting medical clearance for upcoming surgery scheduled on April 18 , 2017 with Dr Everitt Amber. Will update Melissa Cross , APNP of waiting for a response from both providers.

## 2016-03-12 DIAGNOSIS — I69351 Hemiplegia and hemiparesis following cerebral infarction affecting right dominant side: Secondary | ICD-10-CM | POA: Diagnosis not present

## 2016-03-12 DIAGNOSIS — C50919 Malignant neoplasm of unspecified site of unspecified female breast: Secondary | ICD-10-CM | POA: Diagnosis not present

## 2016-03-12 DIAGNOSIS — R2689 Other abnormalities of gait and mobility: Secondary | ICD-10-CM | POA: Diagnosis not present

## 2016-03-12 DIAGNOSIS — I4891 Unspecified atrial fibrillation: Secondary | ICD-10-CM | POA: Diagnosis not present

## 2016-03-12 DIAGNOSIS — F329 Major depressive disorder, single episode, unspecified: Secondary | ICD-10-CM | POA: Diagnosis not present

## 2016-03-12 DIAGNOSIS — I10 Essential (primary) hypertension: Secondary | ICD-10-CM | POA: Diagnosis not present

## 2016-03-13 DIAGNOSIS — F329 Major depressive disorder, single episode, unspecified: Secondary | ICD-10-CM | POA: Diagnosis not present

## 2016-03-13 DIAGNOSIS — I10 Essential (primary) hypertension: Secondary | ICD-10-CM | POA: Diagnosis not present

## 2016-03-13 DIAGNOSIS — I69351 Hemiplegia and hemiparesis following cerebral infarction affecting right dominant side: Secondary | ICD-10-CM | POA: Diagnosis not present

## 2016-03-13 DIAGNOSIS — R2689 Other abnormalities of gait and mobility: Secondary | ICD-10-CM | POA: Diagnosis not present

## 2016-03-13 DIAGNOSIS — I4891 Unspecified atrial fibrillation: Secondary | ICD-10-CM | POA: Diagnosis not present

## 2016-03-13 DIAGNOSIS — C50919 Malignant neoplasm of unspecified site of unspecified female breast: Secondary | ICD-10-CM | POA: Diagnosis not present

## 2016-03-14 ENCOUNTER — Other Ambulatory Visit: Payer: Self-pay

## 2016-03-14 ENCOUNTER — Encounter (HOSPITAL_COMMUNITY): Payer: Self-pay | Admitting: Emergency Medicine

## 2016-03-14 ENCOUNTER — Emergency Department (HOSPITAL_COMMUNITY): Payer: Medicare Other

## 2016-03-14 ENCOUNTER — Emergency Department (HOSPITAL_COMMUNITY)
Admission: EM | Admit: 2016-03-14 | Discharge: 2016-03-14 | Disposition: A | Discharge status: Home / Self Care | Payer: Medicare Other | Attending: Emergency Medicine | Admitting: Emergency Medicine

## 2016-03-14 DIAGNOSIS — R079 Chest pain, unspecified: Secondary | ICD-10-CM | POA: Diagnosis not present

## 2016-03-14 DIAGNOSIS — Z7982 Long term (current) use of aspirin: Secondary | ICD-10-CM | POA: Insufficient documentation

## 2016-03-14 DIAGNOSIS — M549 Dorsalgia, unspecified: Secondary | ICD-10-CM | POA: Diagnosis not present

## 2016-03-14 DIAGNOSIS — R531 Weakness: Secondary | ICD-10-CM | POA: Diagnosis not present

## 2016-03-14 DIAGNOSIS — R0789 Other chest pain: Secondary | ICD-10-CM | POA: Diagnosis not present

## 2016-03-14 DIAGNOSIS — E785 Hyperlipidemia, unspecified: Secondary | ICD-10-CM | POA: Diagnosis not present

## 2016-03-14 DIAGNOSIS — Z853 Personal history of malignant neoplasm of breast: Secondary | ICD-10-CM | POA: Insufficient documentation

## 2016-03-14 DIAGNOSIS — F419 Anxiety disorder, unspecified: Secondary | ICD-10-CM | POA: Diagnosis not present

## 2016-03-14 DIAGNOSIS — I4891 Unspecified atrial fibrillation: Secondary | ICD-10-CM | POA: Diagnosis not present

## 2016-03-14 DIAGNOSIS — Z7901 Long term (current) use of anticoagulants: Secondary | ICD-10-CM | POA: Diagnosis not present

## 2016-03-14 DIAGNOSIS — M1990 Unspecified osteoarthritis, unspecified site: Secondary | ICD-10-CM | POA: Insufficient documentation

## 2016-03-14 DIAGNOSIS — Z79899 Other long term (current) drug therapy: Secondary | ICD-10-CM | POA: Diagnosis not present

## 2016-03-14 DIAGNOSIS — R4789 Other speech disturbances: Secondary | ICD-10-CM | POA: Diagnosis not present

## 2016-03-14 DIAGNOSIS — Z8673 Personal history of transient ischemic attack (TIA), and cerebral infarction without residual deficits: Secondary | ICD-10-CM | POA: Insufficient documentation

## 2016-03-14 DIAGNOSIS — C579 Malignant neoplasm of female genital organ, unspecified: Secondary | ICD-10-CM | POA: Insufficient documentation

## 2016-03-14 DIAGNOSIS — R0602 Shortness of breath: Secondary | ICD-10-CM | POA: Diagnosis not present

## 2016-03-14 DIAGNOSIS — I10 Essential (primary) hypertension: Secondary | ICD-10-CM | POA: Diagnosis not present

## 2016-03-14 LAB — CBC WITH DIFFERENTIAL/PLATELET
Basophils Absolute: 0 10*3/uL (ref 0.0–0.1)
Basophils Relative: 0 %
Eosinophils Absolute: 0.1 10*3/uL (ref 0.0–0.7)
Eosinophils Relative: 1 %
HCT: 28.1 % — ABNORMAL LOW (ref 36.0–46.0)
Hemoglobin: 9.5 g/dL — ABNORMAL LOW (ref 12.0–15.0)
Lymphocytes Relative: 28 %
Lymphs Abs: 1.8 10*3/uL (ref 0.7–4.0)
MCH: 33.5 pg (ref 26.0–34.0)
MCHC: 33.8 g/dL (ref 30.0–36.0)
MCV: 98.9 fL (ref 78.0–100.0)
Monocytes Absolute: 1.1 10*3/uL — ABNORMAL HIGH (ref 0.1–1.0)
Monocytes Relative: 18 %
Neutro Abs: 3.3 10*3/uL (ref 1.7–7.7)
Neutrophils Relative %: 53 %
Platelets: 243 10*3/uL (ref 150–400)
RBC: 2.84 MIL/uL — ABNORMAL LOW (ref 3.87–5.11)
RDW: 25.9 % — ABNORMAL HIGH (ref 11.5–15.5)
WBC: 6.3 10*3/uL (ref 4.0–10.5)

## 2016-03-14 LAB — COMPREHENSIVE METABOLIC PANEL
ALT: 34 U/L (ref 14–54)
AST: 27 U/L (ref 15–41)
Albumin: 3.2 g/dL — ABNORMAL LOW (ref 3.5–5.0)
Alkaline Phosphatase: 64 U/L (ref 38–126)
Anion gap: 7 (ref 5–15)
BUN: 10 mg/dL (ref 6–20)
CO2: 26 mmol/L (ref 22–32)
Calcium: 9.2 mg/dL (ref 8.9–10.3)
Chloride: 108 mmol/L (ref 101–111)
Creatinine, Ser: 0.62 mg/dL (ref 0.44–1.00)
GFR calc Af Amer: >60 mL/min (ref >60)
GFR calc non Af Amer: >60 mL/min (ref >60)
Glucose, Bld: 103 mg/dL — ABNORMAL HIGH (ref 65–99)
Potassium: 4 mmol/L (ref 3.5–5.1)
Sodium: 141 mmol/L (ref 135–145)
Total Bilirubin: 0.6 mg/dL (ref 0.3–1.2)
Total Protein: 6.1 g/dL — ABNORMAL LOW (ref 6.5–8.1)

## 2016-03-14 LAB — URINALYSIS, ROUTINE W REFLEX MICROSCOPIC
Bilirubin Urine: NEGATIVE
Glucose, UA: NEGATIVE mg/dL
Hgb urine dipstick: NEGATIVE
Ketones, ur: NEGATIVE mg/dL
Leukocytes, UA: NEGATIVE
Nitrite: NEGATIVE
Protein, ur: NEGATIVE mg/dL
Specific Gravity, Urine: 1.02 (ref 1.005–1.030)
pH: 6.5 (ref 5.0–8.0)

## 2016-03-14 LAB — LIPASE, BLOOD: Lipase: 33 U/L (ref 11–51)

## 2016-03-14 LAB — I-STAT TROPONIN, ED: Troponin i, poc: 0 ng/mL (ref 0.00–0.08)

## 2016-03-14 MED ORDER — TRAMADOL HCL 50 MG PO TABS
50.0000 mg | ORAL_TABLET | Freq: Once | ORAL | Status: AC
  Administered 2016-03-14: 50 mg via ORAL
  Filled 2016-03-14: qty 1

## 2016-03-14 MED ORDER — IOPAMIDOL (ISOVUE-370) INJECTION 76%
100.0000 mL | Freq: Once | INTRAVENOUS | Status: AC | PRN
Start: 2016-03-14 — End: 2016-03-14
  Administered 2016-03-14: 100 mL via INTRAVENOUS

## 2016-03-14 MED ORDER — SODIUM CHLORIDE 0.9 % IV BOLUS (SEPSIS): 250.0000 mL | Freq: Once | INTRAVENOUS | Status: DC

## 2016-03-14 MED ORDER — FENTANYL CITRATE (PF) 100 MCG/2ML IJ SOLN
50.0000 ug | Freq: Once | INTRAMUSCULAR | Status: AC
  Administered 2016-03-14: 50 ug via INTRAVENOUS
  Filled 2016-03-14: qty 2

## 2016-03-14 MED ORDER — TRAMADOL HCL 50 MG PO TABS: 50.0000 mg | ORAL_TABLET | Freq: Four times a day (QID) | ORAL | Status: DC | PRN

## 2016-03-14 MED ORDER — ONDANSETRON HCL 4 MG/2ML IJ SOLN
4.0000 mg | Freq: Once | INTRAMUSCULAR | Status: AC
  Administered 2016-03-14: 4 mg via INTRAVENOUS
  Filled 2016-03-14: qty 2

## 2016-03-14 MED ORDER — SODIUM CHLORIDE 0.9 % IV BOLUS (SEPSIS)
500.0000 mL | Freq: Once | INTRAVENOUS | Status: AC
  Administered 2016-03-14: 500 mL via INTRAVENOUS

## 2016-03-14 MED ORDER — SODIUM CHLORIDE 0.9 % IV SOLN: INTRAVENOUS | Status: DC

## 2016-03-14 MED ORDER — SODIUM CHLORIDE 0.9 % IV SOLN
INTRAVENOUS | Status: DC
  Administered 2016-03-14: 10:00:00 via INTRAVENOUS

## 2016-03-14 NOTE — ED Provider Notes (Signed)
CSN: [id number]     Arrival date & time 03/14/16  [id number] History   First MD Initiated Contact with Patient 03/14/16 0710     Chief Complaint  Patient presents with  . Back Pain    left mid     (Consider location/radiation/quality/duration/timing/severity/associated sxs/prior Treatment) The history is provided by the patient and a relative.  65 year old female with past medical history sniffing for hypertension atrial fib on eloquence. Past CVA with right-sided deficits and speech difficulties. Patient currently undergoing chemotherapy for a gynecologic cancer. Patient presents with the complaint of posterior and lateral and anterior chest pain on the left side for 3 weeks. Not associated with shortness of breath not associated with nausea or vomiting.  Past Medical History  Diagnosis Date  . Hypertension   . Arthritis   . A-fib ([place])   . Hyperlipidemia   . Stroke ([place]) 2010  . Breast cancer ([place]) 2008    s/p lumpectomy, chemo and radiation therapy  . Wears glasses   . Anxiety    Past Surgical History  Procedure Laterality Date  . Breast lumpectomy  2008    left  . Appendectomy    . Tubal ligation    . Colonoscopy  2004   Family History  Problem Relation Age of Onset  . Family history unknown: Yes   Social History  Substance Use Topics  . Smoking status: Never Smoker   . Smokeless tobacco: None  . Alcohol Use: No   OB History    No data available     Review of Systems  Constitutional: Negative for fever.  HENT: Negative for congestion.   Eyes: Negative for visual disturbance.  Respiratory: Negative for shortness of breath.   Cardiovascular: Positive for chest pain.  Gastrointestinal: Negative for nausea, vomiting and abdominal pain.  Genitourinary: Negative for dysuria.  Musculoskeletal: Positive for back pain.  Skin: Negative for rash.  Neurological: Positive for speech difficulty and weakness. Negative for headaches.  Hematological: Does not bruise/bleed  easily.  Psychiatric/Behavioral: Negative for confusion.      Allergies  Codeine  Home Medications   Prior to Admission medications   Medication Sig Start Date End Date Taking? Authorizing Provider  acetaminophen (TYLENOL) 500 MG tablet Take 500-1,000 mg by mouth every 6 (six) hours as needed for moderate pain. Reported on 02/03/2016   Yes Historical Provider, MD  ALPRAZolam Duanne Moron) 0.25 MG tablet Take 0.25 mg by mouth 3 (three) times daily as needed for anxiety.   Yes Historical Provider, MD  apixaban (ELIQUIS) 5 MG TABS tablet Take 1 tablet (5 mg total) by mouth 2 (two) times daily. 09/11/15  Yes Camelia Eng Tysinger, PA-C  aspirin EC 81 MG tablet Take 81 mg by mouth daily.   Yes Historical Provider, MD  atorvastatin (LIPITOR) 40 MG tablet Take 40 mg by mouth at bedtime.   Yes Historical Provider, MD  lidocaine-prilocaine (EMLA) cream Apply to Porta-Cath 1-2 hours prior to access as directed. Patient taking differently: Apply 1 application topically See admin instructions. Apply to Porta-Cath 1-2 hours prior to access as directed. 12/31/15  Yes Lennis Marion Downer, MD  loratadine (CLARITIN) 10 MG tablet Take 10 mg by mouth daily as needed (Recommended for chemo pain).    Yes Historical Provider, MD  LORazepam (ATIVAN) 0.5 MG tablet Place 1 tablet under the tongue or swallow every 6 hours as needed for nausea. Will make you drowsy. Patient taking differently: Take 0.5 mg by mouth every 6 (six) hours as needed. for nausea.  Will make you drowsy. 12/24/15  Yes Lennis Marion Downer, MD  metoprolol (LOPRESSOR) 50 MG tablet Take 50 mg by mouth 2 (two) times daily.   Yes Historical Provider, MD  Multiple Vitamins-Minerals (MULTIVITAMIN WITH MINERALS) tablet Take 1 tablet by mouth daily.   Yes Historical Provider, MD  neomycin-bacitracin-polymyxin (NEOSPORIN) ointment Place 1 application into the nose daily as needed. apply to each nostril once daily PRN if nose bleeds after blowing   Yes Historical Provider, MD   omega-3 acid ethyl esters (LOVAZA) 1 g capsule Take 1 g by mouth daily.   Yes Historical Provider, MD  ondansetron (ZOFRAN) 8 MG tablet Take 1 tablet (8 mg total) by mouth every 8 (eight) hours as needed for nausea or vomiting (Will not make drowsy.). 12/24/15  Yes Lennis Marion Downer, MD  PARoxetine (PAXIL) 20 MG tablet TAKE 1 TABLET BY MOUTH DAILY 06/03/15  Yes Camelia Eng Tysinger, PA-C  prochlorperazine (COMPAZINE) 10 MG tablet Take 1 tablet (10 mg total) by mouth every 6 (six) hours as needed for nausea or vomiting. 12/26/15  Yes Lennis Marion Downer, MD  verapamil (CALAN-SR) 240 MG CR tablet Take 1 tablet (240 mg total) by mouth daily. 07/25/15  Yes Camelia Eng Tysinger, PA-C  dexamethasone (DECADRON) 4 MG tablet Take 5 tablets =20 mg with food 12 hrs before Taxol Chemotherapy Patient taking differently: Take 20 mg by mouth See admin instructions. Take 5 tablets =20 mg with food 12 hrs before Taxol Chemotherapy 02/17/16   Lennis Marion Downer, MD  traMADol (ULTRAM) 50 MG tablet Take 1 tablet (50 mg total) by mouth every 6 (six) hours as needed. 03/14/16   Fredia Sorrow, MD   BP 150/97 mmHg  Pulse 81  Temp(Src) 97.7 F (36.5 C) (Oral)  Resp 16  Ht 5\' 10"  (1.778 m)  Wt 84.823 kg  BMI 26.83 kg/m2  SpO2 100% Physical Exam  Constitutional: She is oriented to person, place, and time. She appears well-developed and well-nourished. No distress.  HENT:  Head: Normocephalic and atraumatic.  Mouth/Throat: Oropharynx is clear and moist.  Eyes: Conjunctivae and EOM are normal. Pupils are equal, round, and reactive to light.  Neck: Normal range of motion. Neck supple.  Cardiovascular: Normal rate, regular rhythm and normal heart sounds.   No murmur heard. Pulmonary/Chest: Effort normal and breath sounds normal. She exhibits no tenderness.  Abdominal: Soft. Bowel sounds are normal. There is no tenderness.  Musculoskeletal:  Deformity and weakness to the right hand due to secondary stroke.  Neurological: She is alert  and oriented to person, place, and time.  Speech difficulties secondary to a past stroke. Also weakness to the right arm secondary to that.  Skin: Skin is warm.  Nursing note and vitals reviewed.   ED Course  Procedures (including critical care time) Labs Review Labs Reviewed  COMPREHENSIVE METABOLIC PANEL - Abnormal; Notable for the following:    Glucose, Bld 103 (*)    Total Protein 6.1 (*)    Albumin 3.2 (*)    All other components within normal limits  CBC WITH DIFFERENTIAL/PLATELET - Abnormal; Notable for the following:    RBC 2.84 (*)    Hemoglobin 9.5 (*)    HCT 28.1 (*)    RDW 25.9 (*)    Monocytes Absolute 1.1 (*)    All other components within normal limits  LIPASE, BLOOD  URINALYSIS, ROUTINE W REFLEX MICROSCOPIC (NOT AT [facility])  I-STAT TROPOININ, ED   Results for orders placed or performed during the hospital encounter  of 03/14/16  Comprehensive metabolic panel  Result Value Ref Range   Sodium 141 135 - 145 mmol/L   Potassium 4.0 3.5 - 5.1 mmol/L   Chloride 108 101 - 111 mmol/L   CO2 26 22 - 32 mmol/L   Glucose, Bld 103 (H) 65 - 99 mg/dL   BUN 10 6 - 20 mg/dL   Creatinine, Ser 0.62 0.44 - 1.00 mg/dL   Calcium 9.2 8.9 - 10.3 mg/dL   Total Protein 6.1 (L) 6.5 - 8.1 g/dL   Albumin 3.2 (L) 3.5 - 5.0 g/dL   AST 27 15 - 41 U/L   ALT 34 14 - 54 U/L   Alkaline Phosphatase 64 38 - 126 U/L   Total Bilirubin 0.6 0.3 - 1.2 mg/dL   GFR calc non Af Amer >60 >60 mL/min   GFR calc Af Amer >60 >60 mL/min   Anion gap 7 5 - 15  Lipase, blood  Result Value Ref Range   Lipase 33 11 - 51 U/L  CBC with Differential/Platelet  Result Value Ref Range   WBC 6.3 4.0 - 10.5 K/uL   RBC 2.84 (L) 3.87 - 5.11 MIL/uL   Hemoglobin 9.5 (L) 12.0 - 15.0 g/dL   HCT 28.1 (L) 36.0 - 46.0 %   MCV 98.9 78.0 - 100.0 fL   MCH 33.5 26.0 - 34.0 pg   MCHC 33.8 30.0 - 36.0 g/dL   RDW 25.9 (H) 11.5 - 15.5 %   Platelets 243 150 - 400 K/uL   Neutrophils Relative % 53 %   Lymphocytes Relative 28 %    Monocytes Relative 18 %   Eosinophils Relative 1 %   Basophils Relative 0 %   Neutro Abs 3.3 1.7 - 7.7 K/uL   Lymphs Abs 1.8 0.7 - 4.0 K/uL   Monocytes Absolute 1.1 (H) 0.1 - 1.0 K/uL   Eosinophils Absolute 0.1 0.0 - 0.7 K/uL   Basophils Absolute 0.0 0.0 - 0.1 K/uL   RBC Morphology POLYCHROMASIA PRESENT   Urinalysis, Routine w reflex microscopic (not at [facility])  Result Value Ref Range   Color, Urine YELLOW YELLOW   APPearance CLEAR CLEAR   Specific Gravity, Urine 1.020 1.005 - 1.030   pH 6.5 5.0 - 8.0   Glucose, UA NEGATIVE NEGATIVE mg/dL   Hgb urine dipstick NEGATIVE NEGATIVE   Bilirubin Urine NEGATIVE NEGATIVE   Ketones, ur NEGATIVE NEGATIVE mg/dL   Protein, ur NEGATIVE NEGATIVE mg/dL   Nitrite NEGATIVE NEGATIVE   Leukocytes, UA NEGATIVE NEGATIVE  I-Stat Troponin, ED (not at [facility])  Result Value Ref Range   Troponin i, poc 0.00 0.00 - 0.08 ng/mL   Comment 3             Imaging Review Dg Chest 2 View  03/14/2016  CLINICAL DATA:  Back pain for 2 weeks. EXAM: CHEST  2 VIEW COMPARISON:  July 29, 2015 FINDINGS: No pneumothorax. Scarring and thickening seen in the right apex, unchanged. A right Port-A-Cath terminates in good position. The heart, hila, and mediastinum are unchanged. No pulmonary nodules, masses, or infiltrates. IMPRESSION: No active cardiopulmonary disease. Electronically Signed   By: Dorise Bullion III M.D   On: 03/14/2016 08:27   Ct Angio Chest Pe W/cm &/or Wo Cm  03/14/2016  CLINICAL DATA:  Pain and shortness of breath. EXAM: CT ANGIOGRAPHY CHEST WITH CONTRAST TECHNIQUE: Multidetector CT imaging of the chest was performed using the standard protocol during bolus administration of intravenous contrast. Multiplanar CT image reconstructions and MIPs were  obtained to evaluate the vascular anatomy. CONTRAST:  100 mL of Isovue 370 COMPARISON:  March 02, 2016 FINDINGS: Bronchiectasis and scarring in the right apex with volume loss is stable, likely sequela of previous  infection. There is also associated pleural thickening in this region which is also stable. There is compensatory expansion of the right middle and lower lobes. A 3.5 mm nodule in the right lower lobe on series 9, image 37 is not significantly changed given difference in slice selection. No new or suspicious nodules. No masses or focal infiltrates. The central airways are stable. No pneumothorax. Soft tissue to the right of the trachea on series 6, image 24 measures 2.5 by 1.4 cm today versus 2.4 by 1.3 cm by my measurement previously. No new adenopathy in the chest. The thoracic aorta is poorly opacified but non aneurysmal with no dissection. No pulmonary emboli identified. The right upper lobe pulmonary artery branches are very small due to the chronic changes in the right upper lobe. No effusions. The 10 x 17 mm low-attenuation lesion in the posterior aspect of the caudate is not as well evaluated today but not significantly changed. Limited views of the upper abdomen are otherwise unremarkable. No bony changes. Review of the MIP images confirms the above findings. IMPRESSION: 1. No pulmonary emboli. 2. Chronic changes to the right upper lobe. 3. Stable lesion in the caudate lobe of the liver. 4. Stable soft tissue prominence to the right of the trachea as described above. 5. Stable small nodule in the right lung. Electronically Signed   By: Dorise Bullion III M.D   On: 03/14/2016 11:14   I have personally reviewed and evaluated these images and lab results as part of my medical decision-making.   EKG Interpretation None         ED ECG REPORT   Date: 03/14/2016  Rate: 90  Rhythm: atrial fibrillation  QRS Axis: normal  Intervals: QT prolonged  ST/T Wave abnormalities: nonspecific ST/T changes  Conduction Disutrbances:none  Narrative Interpretation:   Old EKG Reviewed: none available  I have personally reviewed the EKG tracing and agree with the computerized printout as noted.  MDM    Final diagnoses:  Chest pain, unspecified chest pain type   Workup for the left-sided anterior and posterior and lateral chest pain. Seems to be consistent with a chest wall kind of pain. Chest x-ray without any acute findings. CT angiogram without evidence of pulmonary embolus. There are some chronic changes in the lungs but they all are stable nothing new or worse. Patient's troponin was negative. Patient's EKG without any acute cardiac findings other than atrial fib and she is known to have that. His rate controlled. Patient's labs otherwise without any significant changes.  She will be treated with tramadol patient will be given a trial tramadol here since she's had trouble with allergic reactions to codeine in the past. Patient will follow-up with hematology oncology doctors.     Fredia Sorrow, MD 03/14/16 1234

## 2016-03-14 NOTE — ED Notes (Signed)
Patient c/o back pain to left mid side, states this has been going on for 2 weeks. Patient took tylenol at home without relief. Patient has hx of stroke with right side deficits.

## 2016-03-14 NOTE — Discharge Instructions (Signed)
Chest Wall Pain Chest wall pain is pain in or around the bones and muscles of your chest. Sometimes, an injury causes this pain. Sometimes, the cause may not be known. This pain may take several weeks or longer to get better. HOME CARE Pay attention to any changes in your symptoms. Take these actions to help with your pain:  Rest as told by your doctor.  Avoid activities that cause pain. Try not to use your chest, belly (abdominal), or side muscles to lift heavy things.  If directed, apply ice to the painful area:  Put ice in a plastic bag.  Place a towel between your skin and the bag.  Leave the ice on for 20 minutes, 2-3 times per day.  Take over-the-counter and prescription medicines only as told by your doctor.  Do not use tobacco products, including cigarettes, chewing tobacco, and e-cigarettes. If you need help quitting, ask your doctor.  Keep all follow-up visits as told by your doctor. This is important. GET HELP IF:  You have a fever.  Your chest pain gets worse.  You have new symptoms. GET HELP RIGHT AWAY IF:  You feel sick to your stomach (nauseous) or you throw up (vomit).  You feel sweaty or light-headed.  You have a cough with phlegm (sputum) or you cough up blood.  You are short of breath.   This information is not intended to replace advice given to you by your health care provider. Make sure you discuss any questions you have with your health care provider.   Take the tramadol as needed for pain. Today's workup without any significant findings. Symptoms are probably musculoskeletal in nature.   Document Released: 05/11/2008 Document Revised: 08/14/2015 Document Reviewed: 02/18/2015 Elsevier Interactive Patient Education [employer].

## 2016-03-15 ENCOUNTER — Other Ambulatory Visit: Payer: Self-pay | Admitting: Oncology

## 2016-03-15 DIAGNOSIS — C800 Disseminated malignant neoplasm, unspecified: Secondary | ICD-10-CM

## 2016-03-16 ENCOUNTER — Other Ambulatory Visit (HOSPITAL_BASED_OUTPATIENT_CLINIC_OR_DEPARTMENT_OTHER): Payer: Medicare Other

## 2016-03-16 ENCOUNTER — Ambulatory Visit (HOSPITAL_BASED_OUTPATIENT_CLINIC_OR_DEPARTMENT_OTHER): Payer: Medicare Other | Admitting: Oncology

## 2016-03-16 ENCOUNTER — Ambulatory Visit (HOSPITAL_BASED_OUTPATIENT_CLINIC_OR_DEPARTMENT_OTHER): Payer: Medicare Other

## 2016-03-16 ENCOUNTER — Telehealth: Payer: Self-pay | Admitting: Oncology

## 2016-03-16 VITALS — BP 123/71 | HR 82 | Temp 97.9°F | Resp 17 | Ht 70.0 in | Wt 189.2 lb

## 2016-03-16 DIAGNOSIS — I69351 Hemiplegia and hemiparesis following cerebral infarction affecting right dominant side: Secondary | ICD-10-CM | POA: Diagnosis not present

## 2016-03-16 DIAGNOSIS — I482 Chronic atrial fibrillation, unspecified: Secondary | ICD-10-CM

## 2016-03-16 DIAGNOSIS — I10 Essential (primary) hypertension: Secondary | ICD-10-CM | POA: Diagnosis not present

## 2016-03-16 DIAGNOSIS — D6481 Anemia due to antineoplastic chemotherapy: Secondary | ICD-10-CM

## 2016-03-16 DIAGNOSIS — Z95828 Presence of other vascular implants and grafts: Secondary | ICD-10-CM

## 2016-03-16 DIAGNOSIS — T451X5A Adverse effect of antineoplastic and immunosuppressive drugs, initial encounter: Secondary | ICD-10-CM

## 2016-03-16 DIAGNOSIS — C50919 Malignant neoplasm of unspecified site of unspecified female breast: Secondary | ICD-10-CM | POA: Diagnosis not present

## 2016-03-16 DIAGNOSIS — C801 Malignant (primary) neoplasm, unspecified: Secondary | ICD-10-CM | POA: Diagnosis not present

## 2016-03-16 DIAGNOSIS — I4891 Unspecified atrial fibrillation: Secondary | ICD-10-CM | POA: Diagnosis not present

## 2016-03-16 DIAGNOSIS — Z7901 Long term (current) use of anticoagulants: Secondary | ICD-10-CM

## 2016-03-16 DIAGNOSIS — R4701 Aphasia: Secondary | ICD-10-CM | POA: Diagnosis not present

## 2016-03-16 DIAGNOSIS — R2689 Other abnormalities of gait and mobility: Secondary | ICD-10-CM | POA: Diagnosis not present

## 2016-03-16 DIAGNOSIS — C569 Malignant neoplasm of unspecified ovary: Secondary | ICD-10-CM

## 2016-03-16 DIAGNOSIS — F329 Major depressive disorder, single episode, unspecified: Secondary | ICD-10-CM | POA: Diagnosis not present

## 2016-03-16 DIAGNOSIS — C800 Disseminated malignant neoplasm, unspecified: Secondary | ICD-10-CM

## 2016-03-16 DIAGNOSIS — C561 Malignant neoplasm of right ovary: Secondary | ICD-10-CM

## 2016-03-16 DIAGNOSIS — D701 Agranulocytosis secondary to cancer chemotherapy: Secondary | ICD-10-CM

## 2016-03-16 DIAGNOSIS — C562 Malignant neoplasm of left ovary: Secondary | ICD-10-CM

## 2016-03-16 DIAGNOSIS — C563 Malignant neoplasm of bilateral ovaries: Secondary | ICD-10-CM

## 2016-03-16 LAB — CBC WITH DIFFERENTIAL/PLATELET
BASO%: 0.3 % (ref 0.0–2.0)
Basophils Absolute: 0 10*3/uL (ref 0.0–0.1)
EOS%: 2 % (ref 0.0–7.0)
Eosinophils Absolute: 0.1 10*3/uL (ref 0.0–0.5)
HCT: 29.5 % — ABNORMAL LOW (ref 34.8–46.6)
HGB: 9.5 g/dL — ABNORMAL LOW (ref 11.6–15.9)
LYMPH%: 24 % (ref 14.0–49.7)
MCH: 32.3 pg (ref 25.1–34.0)
MCHC: 32.2 g/dL (ref 31.5–36.0)
MCV: 100.3 fL (ref 79.5–101.0)
MONO#: 1.2 10*3/uL — ABNORMAL HIGH (ref 0.1–0.9)
MONO%: 17.4 % — ABNORMAL HIGH (ref 0.0–14.0)
NEUT#: 4 10*3/uL (ref 1.5–6.5)
NEUT%: 56.3 % (ref 38.4–76.8)
Platelets: 248 10*3/uL (ref 145–400)
RBC: 2.94 10*6/uL — ABNORMAL LOW (ref 3.70–5.45)
RDW: 25.6 % — ABNORMAL HIGH (ref 11.2–14.5)
WBC: 7 10*3/uL (ref 3.9–10.3)
lymph#: 1.7 10*3/uL (ref 0.9–3.3)
nRBC: 1 % — ABNORMAL HIGH (ref 0–0)

## 2016-03-16 LAB — COMPREHENSIVE METABOLIC PANEL
ALT: 27 U/L (ref 0–55)
AST: 20 U/L (ref 5–34)
Albumin: 3.1 g/dL — ABNORMAL LOW (ref 3.5–5.0)
Alkaline Phosphatase: 72 U/L (ref 40–150)
Anion Gap: 7 mEq/L (ref 3–11)
BUN: 9.3 mg/dL (ref 7.0–26.0)
CO2: 26 mEq/L (ref 22–29)
Calcium: 9.3 mg/dL (ref 8.4–10.4)
Chloride: 107 mEq/L (ref 98–109)
Creatinine: 0.7 mg/dL (ref 0.6–1.1)
EGFR: >90 mL/min/{1.73_m2} (ref >90)
Glucose: 120 mg/dl (ref 70–140)
Potassium: 4.1 mEq/L (ref 3.5–5.1)
Sodium: 140 mEq/L (ref 136–145)
Total Bilirubin: 0.7 mg/dL (ref 0.20–1.20)
Total Protein: 6.1 g/dL — ABNORMAL LOW (ref 6.4–8.3)

## 2016-03-16 MED ORDER — HEPARIN SOD (PORK) LOCK FLUSH 100 UNIT/ML IV SOLN
500.0000 [IU] | Freq: Once | INTRAVENOUS | Status: AC
  Administered 2016-03-16: 500 [IU] via INTRAVENOUS
  Filled 2016-03-16: qty 5

## 2016-03-16 MED ORDER — SODIUM CHLORIDE 0.9% FLUSH
10.0000 mL | INTRAVENOUS | Status: DC | PRN
  Administered 2016-03-16: 10 mL via INTRAVENOUS
  Filled 2016-03-16: qty 10

## 2016-03-16 NOTE — Patient Instructions (Signed)

## 2016-03-16 NOTE — Telephone Encounter (Signed)
Spoke with pt to confirm May appt date/times per LL 4/10 pof

## 2016-03-16 NOTE — Progress Notes (Signed)
OFFICE PROGRESS NOTE   March 16, 2016   Physicians: Everitt Amber, Lendon Collar (PCP), Lindell Spar [phone] neurology [place]), Dr Valetta Fuller (cardiology [place], [phone], new patient apt 01-15-16) Carol Ada (Alapati P. Marcello Moores, PCP [place], [place] [phone]) ( (oncologist Dr Maudie Mercury in [place], [place]; breast radiation given at [facility] in [place]). 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  She had restaging CT CAP on 02-21-16 after 3 cycles of dose dense carbo taxol thru 02-28-16, which show significant improvement in tumor burden and no new areas of disease. She saw Dr Denman George on 03-09-16, plan for interval debulking surgery on 03-24-16.  She is on Eliquis and ASA for atrial fibrillation with prior CVA, anticoagulation to be held around surgery.   Patient was seen in ED on 03-14-16 with complaints of back pain, which seems to have begun on 4-7 and persisted. There was no evidence of cardiac etiology and CTA chest showed no PE and no other acute findings to account for the symptoms. She was given pain medication in ED and tramadol. Back pain has significantly improved with pain medication, not clear how often she has used tramadol. She complains of constipation which seems related to pain meds, no BM now since in ED on 4-8, despite trying metamucil and Epsom salts. She has had no vomiting, some abdominal discomfort on left with the constipation. She continues to eat, denies SOB, LE swelling,, bleeding, fever or symptoms of infection, no problems with PAC.  No different neurologic symptoms.  Remainder of 10 point Review of Systems negative/ unchanged  CA 125 12-24-15 by [employer] ECLIA 608, and on 01-01-16 741 PAC in, flushed 03-16-16 Flu vaccine 10-10-15 No  genetics testing known with prior breast cancer  ONCOLOGIC HISTORY Patient presented to ED in [place] 11-28-15 with LLQ pain and left hip pain, symptomatic for at least a year per family but worse. CT AP 11-28-15 had bilateral ovarian masses up to 4 cm on right, with peritoneal carcinomatosis in pelvis, omentum and perihepatic, with small ascites, bilateral pelvic and inguinal adenopathy, with retroperitoneal adenopathy extending to pelvic brim. 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 Recommendation was to begin treatment with carboplatin taxol, with reevaluation after 3 cycles for possible interval debulking surgery. Day 1 cycle 1 dose dense carbo taxol was given on 12-27-15, then missed day 8 cycle 1 due to lack of IV access. ANC was 0.1 on day 14 (01-09-16) with granix given x3 and day 15 delayed another week. She received day 15 cycle 1 on 01-17-16 after PICC placed that day. She completed cycle 3 on 02-28-16. CT CAP 03-02-16 showed resolution of ascites and significant improvement in peritoneal disease and adenopathy.   She has history of left breast cancer 2005 treated in [place] with lumpectomy, chemotherapy and radiation, none of those records presently available.   Objective:  Vital signs in last 24 hours:  BP 123/71 mmHg  Pulse 82  Temp(Src) 97.9 F (36.6 C) (Oral)  Resp 17  Ht '5\' 10"'$  (1.778 m)  Wt 189 lb 3.2 oz (  85.821 kg)  BMI 27.15 kg/m2  SpO2 100%  Alert, oriented and appropriate. Ambulatory without assistance difficulty.  Alopecia  HEENT:PERRL, sclerae not icteric. Oral mucosa moist without lesions, posterior pharynx clear.  Neck supple. No JVD.  Lymphatics:no  cervical,suraclavicular, axillary or inguinal adenopathy Resp: clear to auscultation bilaterally and normal percussion bilaterally Cardio: regular rate and rhythm. No gallop. GI: soft, nontender, not distended, no mass or organomegaly. Normally active bowel sounds. Surgical incision not remarkable. Musculoskeletal/ Extremities: without pitting edema, cords, tenderness Neuro: no peripheral neuropathy. Otherwise nonfocal Skin without rash, ecchymosis, petechiae Portacath-without erythema or tenderness  Lab Results:  Results for orders placed or performed in visit on 03/16/16  CBC with Differential  Result Value Ref Range   WBC 7.0 3.9 - 10.3 10e3/uL   NEUT# 4.0 1.5 - 6.5 10e3/uL   HGB 9.5 (L) 11.6 - 15.9 g/dL   HCT 29.5 (L) 34.8 - 46.6 %   Platelets 248 145 - 400 10e3/uL   MCV 100.3 79.5 - 101.0 fL   MCH 32.3 25.1 - 34.0 pg   MCHC 32.2 31.5 - 36.0 g/dL   RBC 2.94 (L) 3.70 - 5.45 10e6/uL   RDW 25.6 (H) 11.2 - 14.5 %   lymph# 1.7 0.9 - 3.3 10e3/uL   MONO# 1.2 (H) 0.1 - 0.9 10e3/uL   Eosinophils Absolute 0.1 0.0 - 0.5 10e3/uL   Basophils Absolute 0.0 0.0 - 0.1 10e3/uL   NEUT% 56.3 38.4 - 76.8 %   LYMPH% 24.0 14.0 - 49.7 %   MONO% 17.4 (H) 0.0 - 14.0 %   EOS% 2.0 0.0 - 7.0 %   BASO% 0.3 0.0 - 2.0 %   nRBC 1 (H) 0 - 0 %  Comprehensive metabolic panel  Result Value Ref Range   Sodium 140 136 - 145 mEq/L   Potassium 4.1 3.5 - 5.1 mEq/L   Chloride 107 98 - 109 mEq/L   CO2 26 22 - 29 mEq/L   Glucose 120 70 - 140 mg/dl   BUN 9.3 7.0 - 26.0 mg/dL   Creatinine 0.7 0.6 - 1.1 mg/dL   Total Bilirubin 0.70 0.20 - 1.20 mg/dL   Alkaline Phosphatase 72 40 - 150 U/L   AST 20 5 - 34 U/L   ALT 27 0 - 55 U/L   Total Protein 6.1 (L) 6.4 - 8.3 g/dL   Albumin 3.1 (L) 3.5 - 5.0 g/dL   Calcium 9.3 8.4 - 10.4 mg/dL   Anion Gap 7 3 - 11 mEq/L   EGFR >90 >90 ml/min/1.73 m2   CA 125 on 02-28-16 down to 33.6, this having been 137 on 02-03-16.   Studies/Results:  Ct Angio Chest Pe W/cm &/or Wo  Cm  03/14/2016  CLINICAL DATA:  Pain and shortness of breath. EXAM: CT ANGIOGRAPHY CHEST WITH CONTRAST TECHNIQUE: Multidetector CT imaging of the chest was performed using the standard protocol during bolus administration of intravenous contrast. Multiplanar CT image reconstructions and MIPs were obtained to evaluate the vascular anatomy. CONTRAST:  100 mL of Isovue 370 COMPARISON:  March 02, 2016 FINDINGS: Bronchiectasis and scarring in the right apex with volume loss is stable, likely sequela of previous infection. There is also associated pleural thickening in this region which is also stable. There is compensatory expansion of the right middle and lower lobes. A 3.5 mm nodule in the right lower lobe on series 9, image 37 is not significantly changed given difference in slice selection. No new or suspicious nodules. No masses or focal infiltrates. The  central airways are stable. No pneumothorax. Soft tissue to the right of the trachea on series 6, image 24 measures 2.5 by 1.4 cm today versus 2.4 by 1.3 cm by my measurement previously. No new adenopathy in the chest. The thoracic aorta is poorly opacified but non aneurysmal with no dissection. No pulmonary emboli identified. The right upper lobe pulmonary artery branches are very small due to the chronic changes in the right upper lobe. No effusions. The 10 x 17 mm low-attenuation lesion in the posterior aspect of the caudate is not as well evaluated today but not significantly changed. Limited views of the upper abdomen are otherwise unremarkable. No bony changes. Review of the MIP images confirms the above findings. IMPRESSION: 1. No pulmonary emboli. 2. Chronic changes to the right upper lobe. 3. Stable lesion in the caudate lobe of the liver. 4. Stable soft tissue prominence to the right of the trachea as described above. 5. Stable small nodule in the right lung. Electronically Signed   By: Dorise Bullion III M.D   On: 03/14/2016 11:14     EXAM: CT  CHEST, ABDOMEN, AND PELVIS WITH CONTRAST  03-02-16  COMPARISON: CT the abdomen and pelvis 11/28/2015. No prior chest CT.  FINDINGS: CT CHEST FINDINGS  Mediastinum/Lymph Nodes: Heart size is borderline enlarged. There is no significant pericardial fluid, thickening or pericardial calcification. Mildly enlarged right paratracheal lymph node measuring 12 mm in short axis (image 16 of series 2). No other mediastinal or bilateral hilar lymphadenopathy. Esophagus is unremarkable in appearance. No axillary lymphadenopathy. Right internal jugular single-lumen porta cath with tip terminating in the right atrium.  Lungs/Pleura: There is extensive volume loss, septal thickening and architectural distortion throughout the right upper lobe where there is also extensive cylindrical, varicose and cystic bronchiectasis, presumably sequela of prior necrotizing pneumonia. Fibrocavitary changes are noted in the apex of the right upper lobe. Similar findings are present to a much lesser extent in the superior segment of the right lower lobe. Compensatory hyperexpansion of the right middle and lower lobes. A few scattered tiny pulmonary nodules are noted in the lungs bilaterally all of which are 4 mm or less in size, and nonspecific. No larger more suspicious appearing pulmonary nodules or masses. No acute consolidative airspace disease. No pleural effusions.  Musculoskeletal/Soft Tissues: There are no aggressive appearing lytic or blastic lesions noted in the visualized portions of the skeleton.  CT ABDOMEN AND PELVIS FINDINGS  Hepatobiliary: Again noted is a somewhat ill-defined 10 x 17 mm intermediate attenuation (27 HU) lesion in the posterior aspect of the caudate lobe of the liver, which appears grossly unchanged. No other new hepatic lesions are noted. No intra or extrahepatic biliary ductal dilatation. Gallbladder is normal in appearance.  Pancreas: No pancreatic mass. No  pancreatic ductal dilatation. No pancreatic or peripancreatic fluid or inflammatory changes.  Spleen: Unremarkable.  Adrenals/Urinary Tract: Bilateral adrenal glands are normal in appearance. There is multifocal cortical thinning throughout the kidneys bilaterally, compatible with extensive post infectious or inflammatory scarring. Sub cm low-attenuation lesion in the anterior aspect of the interpolar region of the right kidney is too small to characterize, but similar to the prior study, likely a cyst. No hydroureteronephrosis. Urinary bladder is normal in appearance.  Stomach/Bowel: The appearance of the stomach is normal. No pathologic dilatation of small bowel or colon. The appendix is not visualized and may be surgically absent. Regardless, there are no inflammatory changes noted adjacent to the cecum to suggest presence of an acute appendicitis at  this time.  Vascular/Lymphatic: Minimal atherosclerotic disease is noted in the abdominal and pelvic vasculature, without evidence of aneurysm or dissection. No lymphadenopathy is noted in the abdomen or pelvis. Borderline enlarged 9 mm short axis left inguinal lymph node has decreased in size substantially compared to the prior study (previously 18 mm in short axis). Previously noted right external iliac nodal mass has nearly completely regressed.  Reproductive: Uterus is heterogeneously enlarged with multiple coarse calcified lesions compatible with multiple fibroids, largest of which measures 2.6 cm in the left side of the uterine fundus. Ovaries currently measure 4.0 x 2.6 x 4.7 cm on the right and 1.8 x 2.3 x 2.3 cm on the left, substantially smaller than the prior examination.  Other: There continues to be nodular soft tissue thickening along the peritoneal surfaces, however, this has significantly decreased compared to the prior examination. The largest residual soft tissue lesions are in the central aspect of the a  low anatomic pelvis anteriorly measuring 1.2 x 2.3 cm (image 109 of series 2), and in the low left paracolic gutter (image 585 of series 2) measuring 1.3 x 0.9 cm. Previously noted ascites has resolved. No pneumoperitoneum.  Musculoskeletal: There are no aggressive appearing lytic or blastic lesions noted in the visualized portions of the skeleton.  IMPRESSION: 1. Today's study demonstrates a positive response to therapy with resolution of the previously noted malignant ascites, significant regression of previously noted peritoneal implants, and regression of previously noted lymphadenopathy in the abdomen and pelvis. 2. No definite evidence to suggest metastatic disease to the thorax. 3. Stable 1.0 x 1.7 cm intermediate attenuation lesion associated with the posterior aspect of segment 1 of the liver, favored to represent a mildly proteinaceous hepatic cyst. The possibility of a peritoneal implant in this region is not entirely excluded, but is not favored on today's examination. 4. Extensive post infectious scarring inguinal upper right lung, likely sequela of prior necrotizing pneumonia. 5. Multiple tiny pulmonary nodules scattered throughout the lungs bilaterally all measuring 4 mm or less. These are nonspecific, but favored to be benign. Given the patient's history of primary gynecologic malignancy, attention on followup studies is recommended to ensure the stability of these findings. 6. Additional incidental findings, as above.    Medications: I have reviewed the patient's current medications. Discussed laxatives such as senokot S and miralax, as she has not taken more typical laxatives. She has used mag citrate in past, prefers to try 1/2 bottle of this.   DISCUSSION Improvement on scans discussed, and plans for surgery by Dr Denman George as robotic assisted if possible. They understand that she willl need to continue chemo after surgery. Constipation related to pain medication  with ED visit for back pain. Back better, no specific etiology identified.   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 Symptomatically improved, CA 125 improving.  Needs genetics referral, may wait until after surgery so information re primary for the gyn cancer is known. 2. 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 Hopefully will be able to resume outpatient PT/ OT/ speech therapy in near future. Followed by neurology in [facility] History of TIAs 2010  3.PAC in by IR 01-29-16. Very difficult peripheral IV access. 4.atrial fibrillation diagnosed ~ 2011, now on Eliquis + ASA. Now followed by Dr Valetta Fuller [facility], next visit ~ 6 months. No problems  with chemo thrombocytopenia thus far, but need to be aware of anticoagulation if so. 5.Left breast cancer 2005 diagnosed and treated in [place] with lumpectomy and presumably axillary node evaluation, combination chemotherapy (drugs not known) via peripheral veins, local radiation. Up to date on mammograms, done 10-18-15 at [facility]. We will try to get records from prior PCP in [place], as family not able to provide names or contacts of oncologists. 6.hx HTN 7.Constipation apparently from pain medication: increase laxatives until resolved, then prn. Colon polyps at colonoscopy by Dr Benson Norway 2015. Follow up not specified on the report in EMR 8.post appendectomy, BTL 9.family history of cardiac disease with MIs in sisters ages 34 and 43, and in brother 10.flu vaccine done 10-2015 30.information on Advance Directives given 12.chemo neutropenia with ANC 0.1 on day 14 cycle 1. Counts maintaining with gCSF. 13. Chemo peripheral neuropathy: minimal thus far 14.weight loss initially with improvement in cancer, now  gaining. 15.anemia: gradually progressive on chemo, on multivit with iron. Will suggest additional oral iron if tolerated now. She has not been transfused.  All questions answered. I will see her back in early May and expect to resume chemo when she has recovered adequately from upcoming surgery. Time spent 20 min including >50% counseling and coordination of care. Cc PCP and Drs Dellis Filbert and Powers   Gordy Levan, MD   03/16/2016, 10:23 AM

## 2016-03-17 ENCOUNTER — Telehealth: Payer: Self-pay

## 2016-03-17 ENCOUNTER — Encounter: Payer: Self-pay | Admitting: Oncology

## 2016-03-17 DIAGNOSIS — Z95828 Presence of other vascular implants and grafts: Secondary | ICD-10-CM | POA: Insufficient documentation

## 2016-03-17 DIAGNOSIS — D6481 Anemia due to antineoplastic chemotherapy: Secondary | ICD-10-CM

## 2016-03-17 DIAGNOSIS — T451X5A Adverse effect of antineoplastic and immunosuppressive drugs, initial encounter: Secondary | ICD-10-CM

## 2016-03-17 DIAGNOSIS — R569 Unspecified convulsions: Secondary | ICD-10-CM | POA: Diagnosis not present

## 2016-03-17 MED ORDER — FERROUS FUMARATE 325 (106 FE) MG PO TABS: ORAL_TABLET | ORAL | Status: DC

## 2016-03-17 NOTE — Telephone Encounter (Signed)
-----   Message from Gordy Levan, MD sent at 03/17/2016  9:53 AM EDT ----- Not on oral iron - I am not sure if she did not tolerate or we have not tried it. If she will try it, ferrous fumarate or Hemocyte 325 mg daily on empty stomach with Vit C or OJ #30 3 RF  thanks

## 2016-03-17 NOTE — Telephone Encounter (Signed)
Spoke with daughter Kenney Houseman.  Ms. Mullis has not tried any iron preparation. She is willing to try the Hemocyte/ Ferrous fumarate. Called in prescription to Ms. Dara Lords' pharmacy.

## 2016-03-17 NOTE — Patient Instructions (Addendum)
Brenda Cunningham  03/17/2016   Your procedure is scheduled on: Tuesday 03/24/2016  Report to [facility]  Entrance take [facility]  elevators to 3rd floor to  [place] at   1055 AM.  Call this number if you have problems the morning of surgery [phone]   Remember: ONLY 1 PERSON MAY GO WITH YOU TO SHORT STAY TO GET  READY MORNING OF [place].   Do not eat food or drink liquids :After Midnight on Sunday 03/22/2016!  Drink 2 bottles magnesium citrate day before surgery, Monday 03/23/2016, start at 9-10 am. Drink clear liquids all day on Monday 03/23/2016 til midnight!             Follow clear liquid diet below!             Take these medicines the morning of surgery with A SIP OF WATER: Metoprolol, Paxil, Verapamil                                 You may not have any metal on your body including hair pins and              piercings  Do not wear jewelry, make-up, lotions, powders or perfumes, deodorant             Do not wear nail polish.  Do not shave  48 hours prior to surgery.              Men may shave face and neck.   Do not bring valuables to the hospital. [place].  Contacts, dentures or bridgework may not be worn into surgery.  Leave suitcase in the car. After surgery it may be brought to your room.                   Please read over the following fact sheets you were given: _____________________________________________________________________             [facility] - Preparing for Surgery Before surgery, you can play an important role.  Because skin is not sterile, your skin needs to be as free of germs as possible.  You can reduce the number of germs on your skin by washing with CHG (chlorahexidine gluconate) soap before surgery.  CHG is an antiseptic cleaner which kills germs and bonds with the skin to continue killing germs even after washing. Please DO NOT use if you have an allergy  to CHG or antibacterial soaps.  If your skin becomes reddened/irritated stop using the CHG and inform your nurse when you arrive at Short Stay. Do not shave (including legs and underarms) for at least 48 hours prior to the first CHG shower.  You may shave your face/neck. Please follow these instructions carefully:  1.  Shower with CHG Soap the night before surgery and the  morning of Surgery.  2.  If you choose to wash your hair, wash your hair first as usual with your  normal  shampoo.  3.  After you shampoo, rinse your hair and body thoroughly to remove the  shampoo.  4.  Use CHG as you would any other liquid soap.  You can apply chg directly  to the skin and wash                       Gently with a scrungie or clean washcloth.  5.  Apply the CHG Soap to your body ONLY FROM THE NECK DOWN.   Do not use on face/ open                           Wound or open sores. Avoid contact with eyes, ears mouth and genitals (private parts).                       Wash face,  Genitals (private parts) with your normal soap.             6.  Wash thoroughly, paying special attention to the area where your surgery  will be performed.  7.  Thoroughly rinse your body with warm water from the neck down.  8.  DO NOT shower/wash with your normal soap after using and rinsing off  the CHG Soap.                9.  Pat yourself dry with a clean towel.            10.  Wear clean pajamas.            11.  Place clean sheets on your bed the night of your first shower and do not  sleep with pets. Day of Surgery : Do not apply any lotions/deodorants the morning of surgery.  Please wear clean clothes to the hospital/surgery center.  FAILURE TO FOLLOW THESE INSTRUCTIONS MAY RESULT IN THE CANCELLATION OF YOUR SURGERY PATIENT SIGNATURE_________________________________  NURSE SIGNATURE__________________________________  ________________________________________________________________________   Adam Phenix  An incentive spirometer is a tool that can help keep your lungs clear and active. This tool measures how well you are filling your lungs with each breath. Taking long deep breaths may help reverse or decrease the chance of developing breathing (pulmonary) problems (especially infection) following:  A long period of time when you are unable to move or be active. BEFORE THE PROCEDURE   If the spirometer includes an indicator to show your best effort, your nurse or respiratory therapist will set it to a desired goal.  If possible, sit up straight or lean slightly forward. Try not to slouch.  Hold the incentive spirometer in an upright position. INSTRUCTIONS FOR USE   Sit on the edge of your bed if possible, or sit up as far as you can in bed or on a chair.  Hold the incentive spirometer in an upright position.  Breathe out normally.  Place the mouthpiece in your mouth and seal your lips tightly around it.  Breathe in slowly and as deeply as possible, raising the piston or the ball toward the top of the column.  Hold your breath for 3-5 seconds or for as long as possible. Allow the piston or ball to fall to the bottom of the column.  Remove the mouthpiece from your mouth and breathe out normally.  Rest for a few seconds and repeat Steps 1 through 7 at least 10 times every 1-2 hours when you are awake. Take your time and take a few normal breaths between deep breaths.  The spirometer may include an indicator to show  your best effort. Use the indicator as a goal to work toward during each repetition.  After each set of 10 deep breaths, practice coughing to be sure your lungs are clear. If you have an incision (the cut made at the time of surgery), support your incision when coughing by placing a pillow or rolled up towels firmly against it. Once you are able to get out of bed, walk around indoors and cough well. You may stop using the incentive spirometer when instructed by  your caregiver.  RISKS AND COMPLICATIONS  Take your time so you do not get dizzy or light-headed.  If you are in pain, you may need to take or ask for pain medication before doing incentive spirometry. It is harder to take a deep breath if you are having pain. AFTER USE  Rest and breathe slowly and easily.  It can be helpful to keep track of a log of your progress. Your caregiver can provide you with a simple table to help with this. If you are using the spirometer at home, follow these instructions: [place] IF:   You are having difficultly using the spirometer.  You have trouble using the spirometer as often as instructed.  Your pain medication is not giving enough relief while using the spirometer.  You develop fever of 100.5 F (38.1 C) or higher. SEEK IMMEDIATE MEDICAL CARE IF:   You cough up bloody sputum that had not been present before.  You develop fever of 102 F (38.9 C) or greater.  You develop worsening pain at or near the incision site. MAKE SURE YOU:   Understand these instructions.  Will watch your condition.  Will get help right away if you are not doing well or get worse. Document Released: 04/05/2007 Document Revised: 02/15/2012 Document Reviewed: 06/06/2007 ExitCare Patient Information 2014 ExitCare, [place].   ________________________________________________________________________  WHAT IS A BLOOD TRANSFUSION? Blood Transfusion Information  A transfusion is the replacement of blood or some of its parts. Blood is made up of multiple cells which provide different functions.  Red blood cells carry oxygen and are used for blood loss replacement.  White blood cells fight against infection.  Platelets control bleeding.  Plasma helps clot blood.  Other blood products are available for specialized needs, such as hemophilia or other clotting disorders. BEFORE THE TRANSFUSION  Who gives blood for transfusions?   Healthy volunteers who are  fully evaluated to make sure their blood is safe. This is blood bank blood. Transfusion therapy is the safest it has ever been in the practice of medicine. Before blood is taken from a donor, a complete history is taken to make sure that person has no history of diseases nor engages in risky social behavior (examples are intravenous drug use or sexual activity with multiple partners). The donor's travel history is screened to minimize risk of transmitting infections, such as malaria. The donated blood is tested for signs of infectious diseases, such as HIV and hepatitis. The blood is then tested to be sure it is compatible with you in order to minimize the chance of a transfusion reaction. If you or a relative donates blood, this is often done in anticipation of surgery and is not appropriate for emergency situations. It takes many days to process the donated blood. RISKS AND COMPLICATIONS Although transfusion therapy is very safe and saves many lives, the main dangers of transfusion include:   Getting an infectious disease.  Developing a transfusion reaction. This is an allergic reaction to  something in the blood you were given. Every precaution is taken to prevent this. The decision to have a blood transfusion has been considered carefully by your caregiver before blood is given. Blood is not given unless the benefits outweigh the risks. AFTER THE TRANSFUSION  Right after receiving a blood transfusion, you will usually feel much better and more energetic. This is especially true if your red blood cells have gotten low (anemic). The transfusion raises the level of the red blood cells which carry oxygen, and this usually causes an energy increase.  The nurse administering the transfusion will monitor you carefully for complications. HOME CARE INSTRUCTIONS  No special instructions are needed after a transfusion. You may find your energy is better. Speak with your caregiver about any limitations on  activity for underlying diseases you may have. SEEK MEDICAL CARE IF:   Your condition is not improving after your transfusion.  You develop redness or irritation at the intravenous (IV) site. SEEK IMMEDIATE MEDICAL CARE IF:  Any of the following symptoms occur over the next 12 hours:  Shaking chills.  You have a temperature by mouth above 102 F (38.9 C), not controlled by medicine.  Chest, back, or muscle pain.  People around you feel you are not acting correctly or are confused.  Shortness of breath or difficulty breathing.  Dizziness and fainting.  You get a rash or develop hives.  You have a decrease in urine output.  Your urine turns a dark color or changes to pink, red, or brown. Any of the following symptoms occur over the next 10 days:  You have a temperature by mouth above 102 F (38.9 C), not controlled by medicine.  Shortness of breath.  Weakness after normal activity.  The white part of the eye turns yellow (jaundice).  You have a decrease in the amount of urine or are urinating less often.  Your urine turns a dark color or changes to pink, red, or brown. Document Released: 11/20/2000 Document Revised: 02/15/2012 Document Reviewed: 07/09/2008 ExitCare Patient Information 2014 ExitCare, [place].  _______________________________________________________________________   CLEAR LIQUID DIET   Foods Allowed                                                                     Foods Excluded  Coffee and tea, regular and decaf                             liquids that you cannot  Plain Jell-O in any flavor                                             see through such as: Fruit ices (not with fruit pulp)                                     milk, soups, orange juice  Iced Popsicles  All solid food                                     Cranberry, grape and apple juices Sports drinks like Gatorade Lightly seasoned clear broth or  consume(fat free) Sugar, honey syrup  Sample Menu Breakfast                                Lunch                                     Supper Cranberry juice                    Beef broth                            Chicken broth Jell-O                                     Grape juice                           Apple juice Coffee or tea                        Jell-O                                      Popsicle                                                Coffee or tea                        Coffee or tea  _____________________________________________________________________

## 2016-03-18 ENCOUNTER — Encounter (HOSPITAL_COMMUNITY): Payer: Self-pay

## 2016-03-18 ENCOUNTER — Encounter (HOSPITAL_COMMUNITY)
Admission: RE | Admit: 2016-03-18 | Discharge: 2016-03-18 | Disposition: A | Discharge status: Home / Self Care | Payer: Medicare Other | Source: Ambulatory Visit | Attending: Gynecologic Oncology | Admitting: Gynecologic Oncology

## 2016-03-18 DIAGNOSIS — I69351 Hemiplegia and hemiparesis following cerebral infarction affecting right dominant side: Secondary | ICD-10-CM | POA: Diagnosis not present

## 2016-03-18 DIAGNOSIS — R2689 Other abnormalities of gait and mobility: Secondary | ICD-10-CM | POA: Diagnosis not present

## 2016-03-18 DIAGNOSIS — F329 Major depressive disorder, single episode, unspecified: Secondary | ICD-10-CM | POA: Diagnosis not present

## 2016-03-18 DIAGNOSIS — I10 Essential (primary) hypertension: Secondary | ICD-10-CM | POA: Diagnosis not present

## 2016-03-18 DIAGNOSIS — Z01812 Encounter for preprocedural laboratory examination: Secondary | ICD-10-CM | POA: Diagnosis not present

## 2016-03-18 DIAGNOSIS — I4891 Unspecified atrial fibrillation: Secondary | ICD-10-CM | POA: Diagnosis not present

## 2016-03-18 DIAGNOSIS — C50919 Malignant neoplasm of unspecified site of unspecified female breast: Secondary | ICD-10-CM | POA: Diagnosis not present

## 2016-03-18 HISTORY — DX: Adverse effect of unspecified anesthetic, initial encounter: T41.45XA

## 2016-03-18 HISTORY — DX: Other complications of anesthesia, initial encounter: T88.59XA

## 2016-03-18 LAB — URINALYSIS, ROUTINE W REFLEX MICROSCOPIC
Bilirubin Urine: NEGATIVE
Glucose, UA: NEGATIVE mg/dL
Hgb urine dipstick: NEGATIVE
Ketones, ur: NEGATIVE mg/dL
Leukocytes, UA: NEGATIVE
Nitrite: NEGATIVE
Protein, ur: NEGATIVE mg/dL
Specific Gravity, Urine: 1.007 (ref 1.005–1.030)
pH: 6 (ref 5.0–8.0)

## 2016-03-18 LAB — ABO/RH: ABO/RH(D): A POS

## 2016-03-18 NOTE — Progress Notes (Signed)
03/13/2016-Pre-op Neuro clearance from Dr. Encarnacion Slates on chart.

## 2016-03-18 NOTE — Telephone Encounter (Signed)
Contacted Dr Valetta Fuller cardiologist , writer contacted Dr Valetta Fuller office March 11, 2016 with a request for medical release . Writer spoke with receptionist and a message was forward with Dr Terrence Dupont Rossi's request for cardiac clearance . Dr Valetta Fuller nurse is to call back or fax clearance or denial for up coming surgery , will update Melissa Cross, APNP .

## 2016-03-19 DIAGNOSIS — R2689 Other abnormalities of gait and mobility: Secondary | ICD-10-CM | POA: Diagnosis not present

## 2016-03-19 DIAGNOSIS — F329 Major depressive disorder, single episode, unspecified: Secondary | ICD-10-CM | POA: Diagnosis not present

## 2016-03-19 DIAGNOSIS — C50919 Malignant neoplasm of unspecified site of unspecified female breast: Secondary | ICD-10-CM | POA: Diagnosis not present

## 2016-03-19 DIAGNOSIS — I10 Essential (primary) hypertension: Secondary | ICD-10-CM | POA: Diagnosis not present

## 2016-03-19 DIAGNOSIS — I69351 Hemiplegia and hemiparesis following cerebral infarction affecting right dominant side: Secondary | ICD-10-CM | POA: Diagnosis not present

## 2016-03-19 DIAGNOSIS — I4891 Unspecified atrial fibrillation: Secondary | ICD-10-CM | POA: Diagnosis not present

## 2016-03-19 NOTE — Progress Notes (Signed)
Cardiac clearance noet dr powers on chart for 03-24-16 surgery

## 2016-03-20 DIAGNOSIS — F329 Major depressive disorder, single episode, unspecified: Secondary | ICD-10-CM | POA: Diagnosis not present

## 2016-03-20 DIAGNOSIS — C50919 Malignant neoplasm of unspecified site of unspecified female breast: Secondary | ICD-10-CM | POA: Diagnosis not present

## 2016-03-20 DIAGNOSIS — I69351 Hemiplegia and hemiparesis following cerebral infarction affecting right dominant side: Secondary | ICD-10-CM | POA: Diagnosis not present

## 2016-03-20 DIAGNOSIS — I10 Essential (primary) hypertension: Secondary | ICD-10-CM | POA: Diagnosis not present

## 2016-03-20 DIAGNOSIS — I4891 Unspecified atrial fibrillation: Secondary | ICD-10-CM | POA: Diagnosis not present

## 2016-03-20 DIAGNOSIS — R2689 Other abnormalities of gait and mobility: Secondary | ICD-10-CM | POA: Diagnosis not present

## 2016-03-21 DIAGNOSIS — R2689 Other abnormalities of gait and mobility: Secondary | ICD-10-CM | POA: Diagnosis not present

## 2016-03-21 DIAGNOSIS — C50919 Malignant neoplasm of unspecified site of unspecified female breast: Secondary | ICD-10-CM | POA: Diagnosis not present

## 2016-03-21 DIAGNOSIS — I10 Essential (primary) hypertension: Secondary | ICD-10-CM | POA: Diagnosis not present

## 2016-03-21 DIAGNOSIS — F329 Major depressive disorder, single episode, unspecified: Secondary | ICD-10-CM | POA: Diagnosis not present

## 2016-03-21 DIAGNOSIS — I4891 Unspecified atrial fibrillation: Secondary | ICD-10-CM | POA: Diagnosis not present

## 2016-03-21 DIAGNOSIS — I69351 Hemiplegia and hemiparesis following cerebral infarction affecting right dominant side: Secondary | ICD-10-CM | POA: Diagnosis not present

## 2016-03-24 ENCOUNTER — Ambulatory Visit (HOSPITAL_COMMUNITY): Payer: Medicare Other | Admitting: Anesthesiology

## 2016-03-24 ENCOUNTER — Encounter (HOSPITAL_COMMUNITY): Payer: Self-pay

## 2016-03-24 ENCOUNTER — Encounter (HOSPITAL_COMMUNITY)
Admission: RE | Disposition: A | Discharge status: Home / Self Care | Payer: Self-pay | Source: Ambulatory Visit | Attending: Gynecologic Oncology

## 2016-03-24 ENCOUNTER — Ambulatory Visit (HOSPITAL_COMMUNITY)
Admission: RE | Admit: 2016-03-24 | Discharge: 2016-03-25 | Disposition: A | Discharge status: Home / Self Care | Payer: Medicare Other | Source: Ambulatory Visit | Attending: Gynecologic Oncology | Admitting: Gynecologic Oncology

## 2016-03-24 DIAGNOSIS — C775 Secondary and unspecified malignant neoplasm of intrapelvic lymph nodes: Secondary | ICD-10-CM | POA: Insufficient documentation

## 2016-03-24 DIAGNOSIS — I4891 Unspecified atrial fibrillation: Secondary | ICD-10-CM | POA: Insufficient documentation

## 2016-03-24 DIAGNOSIS — C55 Malignant neoplasm of uterus, part unspecified: Secondary | ICD-10-CM | POA: Diagnosis not present

## 2016-03-24 DIAGNOSIS — N858 Other specified noninflammatory disorders of uterus: Secondary | ICD-10-CM | POA: Insufficient documentation

## 2016-03-24 DIAGNOSIS — I69331 Monoplegia of upper limb following cerebral infarction affecting right dominant side: Secondary | ICD-10-CM | POA: Diagnosis not present

## 2016-03-24 DIAGNOSIS — N72 Inflammatory disease of cervix uteri: Secondary | ICD-10-CM | POA: Diagnosis not present

## 2016-03-24 DIAGNOSIS — D649 Anemia, unspecified: Secondary | ICD-10-CM | POA: Diagnosis not present

## 2016-03-24 DIAGNOSIS — N736 Female pelvic peritoneal adhesions (postinfective): Secondary | ICD-10-CM | POA: Insufficient documentation

## 2016-03-24 DIAGNOSIS — C569 Malignant neoplasm of unspecified ovary: Secondary | ICD-10-CM | POA: Diagnosis not present

## 2016-03-24 DIAGNOSIS — N83201 Unspecified ovarian cyst, right side: Secondary | ICD-10-CM | POA: Diagnosis not present

## 2016-03-24 DIAGNOSIS — F419 Anxiety disorder, unspecified: Secondary | ICD-10-CM | POA: Diagnosis not present

## 2016-03-24 DIAGNOSIS — I69320 Aphasia following cerebral infarction: Secondary | ICD-10-CM | POA: Insufficient documentation

## 2016-03-24 DIAGNOSIS — C562 Malignant neoplasm of left ovary: Secondary | ICD-10-CM | POA: Diagnosis not present

## 2016-03-24 DIAGNOSIS — N7011 Chronic salpingitis: Secondary | ICD-10-CM | POA: Insufficient documentation

## 2016-03-24 DIAGNOSIS — D259 Leiomyoma of uterus, unspecified: Secondary | ICD-10-CM | POA: Insufficient documentation

## 2016-03-24 DIAGNOSIS — Z8543 Personal history of malignant neoplasm of ovary: Secondary | ICD-10-CM | POA: Insufficient documentation

## 2016-03-24 DIAGNOSIS — Z79899 Other long term (current) drug therapy: Secondary | ICD-10-CM | POA: Diagnosis not present

## 2016-03-24 DIAGNOSIS — C494 Malignant neoplasm of connective and soft tissue of abdomen: Secondary | ICD-10-CM | POA: Diagnosis not present

## 2016-03-24 DIAGNOSIS — Z9221 Personal history of antineoplastic chemotherapy: Secondary | ICD-10-CM | POA: Insufficient documentation

## 2016-03-24 DIAGNOSIS — T45515A Adverse effect of anticoagulants, initial encounter: Secondary | ICD-10-CM | POA: Insufficient documentation

## 2016-03-24 DIAGNOSIS — C561 Malignant neoplasm of right ovary: Secondary | ICD-10-CM

## 2016-03-24 DIAGNOSIS — E785 Hyperlipidemia, unspecified: Secondary | ICD-10-CM | POA: Diagnosis not present

## 2016-03-24 DIAGNOSIS — Z7982 Long term (current) use of aspirin: Secondary | ICD-10-CM | POA: Insufficient documentation

## 2016-03-24 DIAGNOSIS — N839 Noninflammatory disorder of ovary, fallopian tube and broad ligament, unspecified: Secondary | ICD-10-CM | POA: Diagnosis not present

## 2016-03-24 DIAGNOSIS — Z7901 Long term (current) use of anticoagulants: Secondary | ICD-10-CM | POA: Diagnosis not present

## 2016-03-24 DIAGNOSIS — D6832 Hemorrhagic disorder due to extrinsic circulating anticoagulants: Secondary | ICD-10-CM | POA: Insufficient documentation

## 2016-03-24 DIAGNOSIS — C481 Malignant neoplasm of specified parts of peritoneum: Secondary | ICD-10-CM | POA: Diagnosis not present

## 2016-03-24 DIAGNOSIS — C563 Malignant neoplasm of bilateral ovaries: Secondary | ICD-10-CM

## 2016-03-24 DIAGNOSIS — I10 Essential (primary) hypertension: Secondary | ICD-10-CM | POA: Insufficient documentation

## 2016-03-24 DIAGNOSIS — C786 Secondary malignant neoplasm of retroperitoneum and peritoneum: Secondary | ICD-10-CM | POA: Diagnosis not present

## 2016-03-24 HISTORY — PX: DEBULKING: SHX6277

## 2016-03-24 HISTORY — PX: OMENTECTOMY: SHX5985

## 2016-03-24 HISTORY — PX: ROBOTIC ASSISTED TOTAL HYSTERECTOMY WITH BILATERAL SALPINGO OOPHERECTOMY: SHX6086

## 2016-03-24 LAB — TYPE AND SCREEN
ABO/RH(D): A POS
Antibody Screen: NEGATIVE

## 2016-03-24 SURGERY — HYSTERECTOMY, TOTAL, ROBOT-ASSISTED, LAPAROSCOPIC, WITH BILATERAL SALPINGO-OOPHORECTOMY
Anesthesia: General

## 2016-03-24 MED ORDER — SUGAMMADEX SODIUM 200 MG/2ML IV SOLN
INTRAVENOUS | Status: AC
  Filled 2016-03-24: qty 2

## 2016-03-24 MED ORDER — FENTANYL CITRATE (PF) 250 MCG/5ML IJ SOLN
INTRAMUSCULAR | Status: DC | PRN
  Administered 2016-03-24 (×7): 50 ug via INTRAVENOUS

## 2016-03-24 MED ORDER — MIDAZOLAM HCL 2 MG/2ML IJ SOLN
INTRAMUSCULAR | Status: AC
  Filled 2016-03-24: qty 2

## 2016-03-24 MED ORDER — PROCHLORPERAZINE EDISYLATE 5 MG/ML IJ SOLN
5.0000 mg | Freq: Once | INTRAMUSCULAR | Status: DC | PRN
  Filled 2016-03-24: qty 1

## 2016-03-24 MED ORDER — KCL IN DEXTROSE-NACL 20-5-0.45 MEQ/L-%-% IV SOLN
INTRAVENOUS | Status: DC
  Administered 2016-03-24: 18:00:00 via INTRAVENOUS
  Filled 2016-03-24 (×2): qty 1000

## 2016-03-24 MED ORDER — ONDANSETRON HCL 4 MG/2ML IJ SOLN
INTRAMUSCULAR | Status: AC
  Filled 2016-03-24: qty 2

## 2016-03-24 MED ORDER — DEXAMETHASONE SODIUM PHOSPHATE 10 MG/ML IJ SOLN
INTRAMUSCULAR | Status: DC | PRN
  Administered 2016-03-24: 10 mg via INTRAVENOUS

## 2016-03-24 MED ORDER — ENOXAPARIN SODIUM 40 MG/0.4ML ~~LOC~~ SOLN
40.0000 mg | SUBCUTANEOUS | Status: AC
  Administered 2016-03-24: 40 mg via SUBCUTANEOUS
  Filled 2016-03-24: qty 0.4

## 2016-03-24 MED ORDER — ONDANSETRON HCL 4 MG/2ML IJ SOLN
INTRAMUSCULAR | Status: DC | PRN
  Administered 2016-03-24: 4 mg via INTRAVENOUS

## 2016-03-24 MED ORDER — LACTATED RINGERS IV SOLN
INTRAVENOUS | Status: DC | PRN
  Administered 2016-03-24 (×2): via INTRAVENOUS

## 2016-03-24 MED ORDER — PROPOFOL 10 MG/ML IV BOLUS
INTRAVENOUS | Status: DC | PRN
  Administered 2016-03-24: 100 mg via INTRAVENOUS
  Administered 2016-03-24: 50 mg via INTRAVENOUS

## 2016-03-24 MED ORDER — CEFAZOLIN SODIUM-DEXTROSE 2-4 GM/100ML-% IV SOLN
INTRAVENOUS | Status: AC
  Filled 2016-03-24: qty 100

## 2016-03-24 MED ORDER — STERILE WATER FOR IRRIGATION IR SOLN
Status: DC | PRN
  Administered 2016-03-24: 1000 mL

## 2016-03-24 MED ORDER — PROPOFOL 10 MG/ML IV BOLUS
INTRAVENOUS | Status: AC
  Filled 2016-03-24: qty 20

## 2016-03-24 MED ORDER — FENTANYL CITRATE (PF) 100 MCG/2ML IJ SOLN: 25.0000 ug | INTRAMUSCULAR | Status: DC | PRN

## 2016-03-24 MED ORDER — LIDOCAINE HCL (CARDIAC) 20 MG/ML IV SOLN
INTRAVENOUS | Status: DC | PRN
  Administered 2016-03-24: 60 mg via INTRATRACHEAL

## 2016-03-24 MED ORDER — CETYLPYRIDINIUM CHLORIDE 0.05 % MT LIQD: 7.0000 mL | Freq: Two times a day (BID) | OROMUCOSAL | Status: DC

## 2016-03-24 MED ORDER — FENTANYL CITRATE (PF) 100 MCG/2ML IJ SOLN
INTRAMUSCULAR | Status: AC
  Filled 2016-03-24: qty 2

## 2016-03-24 MED ORDER — METOPROLOL TARTRATE 25 MG PO TABS
25.0000 mg | ORAL_TABLET | Freq: Two times a day (BID) | ORAL | Status: DC
  Administered 2016-03-24 – 2016-03-25 (×2): 25 mg via ORAL
  Filled 2016-03-24 (×3): qty 1

## 2016-03-24 MED ORDER — PAROXETINE HCL 20 MG PO TABS
20.0000 mg | ORAL_TABLET | Freq: Every day | ORAL | Status: DC
  Administered 2016-03-25: 20 mg via ORAL
  Filled 2016-03-24: qty 1

## 2016-03-24 MED ORDER — ROCURONIUM BROMIDE 100 MG/10ML IV SOLN
INTRAVENOUS | Status: DC | PRN
  Administered 2016-03-24: 10 mg via INTRAVENOUS
  Administered 2016-03-24: 5 mg via INTRAVENOUS
  Administered 2016-03-24: 50 mg via INTRAVENOUS

## 2016-03-24 MED ORDER — ALPRAZOLAM 0.25 MG PO TABS: 0.2500 mg | ORAL_TABLET | Freq: Three times a day (TID) | ORAL | Status: DC | PRN

## 2016-03-24 MED ORDER — DEXAMETHASONE SODIUM PHOSPHATE 10 MG/ML IJ SOLN
INTRAMUSCULAR | Status: AC
  Filled 2016-03-24: qty 1

## 2016-03-24 MED ORDER — ROCURONIUM BROMIDE 100 MG/10ML IV SOLN
INTRAVENOUS | Status: AC
  Filled 2016-03-24: qty 1

## 2016-03-24 MED ORDER — ACETAMINOPHEN 500 MG PO TABS
1000.0000 mg | ORAL_TABLET | Freq: Two times a day (BID) | ORAL | Status: DC
  Administered 2016-03-24 – 2016-03-25 (×2): 1000 mg via ORAL
  Filled 2016-03-24 (×4): qty 2

## 2016-03-24 MED ORDER — VERAPAMIL HCL ER 240 MG PO TBCR
240.0000 mg | EXTENDED_RELEASE_TABLET | Freq: Every day | ORAL | Status: DC
  Administered 2016-03-25: 240 mg via ORAL
  Filled 2016-03-24: qty 1

## 2016-03-24 MED ORDER — CEFAZOLIN SODIUM-DEXTROSE 2-4 GM/100ML-% IV SOLN
2.0000 g | INTRAVENOUS | Status: AC
  Administered 2016-03-24: 2 g via INTRAVENOUS
  Filled 2016-03-24: qty 100

## 2016-03-24 MED ORDER — ATORVASTATIN CALCIUM 40 MG PO TABS
40.0000 mg | ORAL_TABLET | Freq: Every day | ORAL | Status: DC
  Administered 2016-03-24: 40 mg via ORAL
  Filled 2016-03-24 (×2): qty 1

## 2016-03-24 MED ORDER — LACTATED RINGERS IR SOLN
Status: DC | PRN
  Administered 2016-03-24: 1000 mL

## 2016-03-24 MED ORDER — CHLORHEXIDINE GLUCONATE 0.12 % MT SOLN
15.0000 mL | Freq: Two times a day (BID) | OROMUCOSAL | Status: DC
  Administered 2016-03-24 – 2016-03-25 (×2): 15 mL via OROMUCOSAL
  Filled 2016-03-24 (×3): qty 15

## 2016-03-24 MED ORDER — ENOXAPARIN SODIUM 40 MG/0.4ML ~~LOC~~ SOLN
40.0000 mg | SUBCUTANEOUS | Status: DC
  Administered 2016-03-25: 40 mg via SUBCUTANEOUS
  Filled 2016-03-24: qty 0.4

## 2016-03-24 MED ORDER — GABAPENTIN 300 MG PO CAPS
300.0000 mg | ORAL_CAPSULE | Freq: Every day | ORAL | Status: AC
  Administered 2016-03-24: 300 mg via ORAL
  Filled 2016-03-24: qty 1

## 2016-03-24 MED ORDER — LACTATED RINGERS IV SOLN: INTRAVENOUS | Status: DC

## 2016-03-24 MED ORDER — TRAMADOL HCL 50 MG PO TABS
100.0000 mg | ORAL_TABLET | Freq: Two times a day (BID) | ORAL | Status: DC | PRN
  Administered 2016-03-25: 100 mg via ORAL
  Filled 2016-03-24: qty 2

## 2016-03-24 MED ORDER — LIDOCAINE HCL (CARDIAC) 20 MG/ML IV SOLN
INTRAVENOUS | Status: AC
  Filled 2016-03-24: qty 5

## 2016-03-24 MED ORDER — SUGAMMADEX SODIUM 200 MG/2ML IV SOLN
INTRAVENOUS | Status: DC | PRN
  Administered 2016-03-24: 200 mg via INTRAVENOUS

## 2016-03-24 MED ORDER — HYDROMORPHONE HCL 2 MG PO TABS: 2.0000 mg | ORAL_TABLET | ORAL | Status: DC | PRN

## 2016-03-24 MED ORDER — HYDROMORPHONE HCL 1 MG/ML IJ SOLN: 0.2000 mg | INTRAMUSCULAR | Status: DC | PRN

## 2016-03-24 MED ORDER — DIPHENHYDRAMINE HCL 25 MG PO CAPS: 25.0000 mg | ORAL_CAPSULE | Freq: Four times a day (QID) | ORAL | Status: DC | PRN

## 2016-03-24 MED ORDER — FENTANYL CITRATE (PF) 250 MCG/5ML IJ SOLN
INTRAMUSCULAR | Status: AC
  Filled 2016-03-24: qty 5

## 2016-03-24 MED ORDER — ONDANSETRON HCL 4 MG/2ML IJ SOLN
4.0000 mg | Freq: Four times a day (QID) | INTRAMUSCULAR | Status: DC | PRN
Start: 2016-03-24 — End: 2016-03-25

## 2016-03-24 MED ORDER — ONDANSETRON HCL 4 MG PO TABS: 4.0000 mg | ORAL_TABLET | Freq: Four times a day (QID) | ORAL | Status: DC | PRN

## 2016-03-24 SURGICAL SUPPLY — 82 items
APPLICATOR COTTON TIP 6IN STRL (MISCELLANEOUS) ×3 IMPLANT
APPLICATOR SURGIFLO ENDO (HEMOSTASIS) ×3 IMPLANT
ATTRACTOMAT 16X20 MAGNETIC DRP (DRAPES) IMPLANT
CELLS DAT CNTRL 66122 CELL SVR (MISCELLANEOUS) IMPLANT
CHLORAPREP W/TINT 26ML (MISCELLANEOUS) ×3 IMPLANT
CLIP TI LARGE 6 (CLIP) ×3 IMPLANT
CLIP TI MEDIUM 6 (CLIP) ×3 IMPLANT
CLIP TI MEDIUM LARGE 6 (CLIP) IMPLANT
CONT SPEC 4OZ CLIKSEAL STRL BL (MISCELLANEOUS) IMPLANT
COVER SURGICAL LIGHT HANDLE (MISCELLANEOUS) ×3 IMPLANT
COVER TIP SHEARS 8 DVNC (MISCELLANEOUS) ×2 IMPLANT
COVER TIP SHEARS 8MM DA VINCI (MISCELLANEOUS) ×1
DRAPE ARM DVNC X/XI (DISPOSABLE) ×8 IMPLANT
DRAPE COLUMN DVNC XI (DISPOSABLE) ×2 IMPLANT
DRAPE DA VINCI XI ARM (DISPOSABLE) ×4
DRAPE DA VINCI XI COLUMN (DISPOSABLE) ×1
DRAPE INCISE IOBAN 66X45 STRL (DRAPES) IMPLANT
DRAPE SHEET LG 3/4 BI-LAMINATE (DRAPES) ×6 IMPLANT
DRAPE SURG IRRIG POUCH 19X23 (DRAPES) ×3 IMPLANT
DRAPE WARM FLUID 44X44 (DRAPE) ×3 IMPLANT
ELECT LIGASURE SHORT 9 REUSE (ELECTRODE) IMPLANT
ELECT REM PT RETURN 9FT ADLT (ELECTROSURGICAL) ×3
ELECTRODE REM PT RTRN 9FT ADLT (ELECTROSURGICAL) ×2 IMPLANT
GAUZE SPONGE 4X4 16PLY XRAY LF (GAUZE/BANDAGES/DRESSINGS) IMPLANT
GLOVE BIO SURGEON STRL SZ 6 (GLOVE) IMPLANT
GLOVE BIO SURGEON STRL SZ 6.5 (GLOVE) IMPLANT
GLOVE SURG SS PI 6.0 STRL IVOR (GLOVE) ×6 IMPLANT
GLOVE SURG SS PI 6.5 STRL IVOR (GLOVE) ×12 IMPLANT
GLOVE SURG SS PI 7.5 STRL IVOR (GLOVE) ×9 IMPLANT
GOWN STRL REUS W/ TWL LRG LVL3 (GOWN DISPOSABLE) ×4 IMPLANT
GOWN STRL REUS W/TWL LRG LVL3 (GOWN DISPOSABLE) ×2
HOLDER FOLEY CATH W/STRAP (MISCELLANEOUS) ×3 IMPLANT
KIT BASIN OR (CUSTOM PROCEDURE TRAY) ×3 IMPLANT
LIGASURE IMPACT 36 18CM CVD LR (INSTRUMENTS) IMPLANT
LIQUID BAND (GAUZE/BANDAGES/DRESSINGS) ×3 IMPLANT
LOOP VESSEL MAXI BLUE (MISCELLANEOUS) IMPLANT
MANIPULATOR UTERINE 4.5 ZUMI (MISCELLANEOUS) ×3 IMPLANT
MARKER SKIN DUAL TIP RULER LAB (MISCELLANEOUS) ×3 IMPLANT
NEEDLE BLUNT 17GA (NEEDLE) ×9 IMPLANT
NS IRRIG 1000ML POUR BTL (IV SOLUTION) ×6 IMPLANT
OBTURATOR XI 8MM BLADELESS (TROCAR) ×3 IMPLANT
OCCLUDER COLPOPNEUMO (BALLOONS) ×3 IMPLANT
PACK GENERAL/GYN (CUSTOM PROCEDURE TRAY) ×3 IMPLANT
PAD POSITIONING PINK XL (MISCELLANEOUS) ×3 IMPLANT
PORT ACCESS TROCAR AIRSEAL 12 (TROCAR) ×2 IMPLANT
PORT ACCESS TROCAR AIRSEAL 5M (TROCAR) ×1
POUCH ENDO CATCH II 15MM (MISCELLANEOUS) IMPLANT
POUCH SPECIMEN RETRIEVAL 10MM (ENDOMECHANICALS) ×3 IMPLANT
RETRACTOR WND ALEXIS 25 LRG (MISCELLANEOUS) IMPLANT
RTRCTR WOUND ALEXIS 18CM MED (MISCELLANEOUS)
RTRCTR WOUND ALEXIS 25CM LRG (MISCELLANEOUS)
SEAL CANN UNIV 5-8 DVNC XI (MISCELLANEOUS) ×8 IMPLANT
SEAL XI 5MM-8MM UNIVERSAL (MISCELLANEOUS) ×4
SET TRI-LUMEN FLTR TB AIRSEAL (TUBING) ×3 IMPLANT
SET TUBE IRRIG SUCTION NO TIP (IRRIGATION / IRRIGATOR) ×3 IMPLANT
SHEET LAVH (DRAPES) ×3 IMPLANT
SOLUTION ELECTROLUBE (MISCELLANEOUS) ×3 IMPLANT
SPOGE SURGIFLO 8M (HEMOSTASIS)
SPONGE LAP 18X18 X RAY DECT (DISPOSABLE) IMPLANT
SPONGE SURGIFLO 8M (HEMOSTASIS) IMPLANT
STAPLER VISISTAT 35W (STAPLE) IMPLANT
SURGIFLO W/THROMBIN 8M KIT (HEMOSTASIS) ×6 IMPLANT
SUT MNCRL AB 4-0 PS2 18 (SUTURE) ×6 IMPLANT
SUT PDS AB 1 TP1 96 (SUTURE) ×6 IMPLANT
SUT VIC AB 0 CT1 27 (SUTURE) ×1
SUT VIC AB 0 CT1 27XBRD ANTBC (SUTURE) ×2 IMPLANT
SUT VIC AB 0 CT1 36 (SUTURE) ×9 IMPLANT
SUT VIC AB 2-0 CT1 36 (SUTURE) ×6 IMPLANT
SUT VIC AB 2-0 CT2 27 (SUTURE) ×18 IMPLANT
SUT VIC AB 3-0 CTX 36 (SUTURE) IMPLANT
SUT VIC AB 3-0 SH 27 (SUTURE) ×1
SUT VIC AB 3-0 SH 27X BRD (SUTURE) ×2 IMPLANT
SYR 50ML LL SCALE MARK (SYRINGE) ×9 IMPLANT
TOWEL OR 17X26 10 PK STRL BLUE (TOWEL DISPOSABLE) ×6 IMPLANT
TOWEL OR NON WOVEN STRL DISP B (DISPOSABLE) ×3 IMPLANT
TRAP SPECIMEN MUCOUS 40CC (MISCELLANEOUS) IMPLANT
TRAY FOLEY BAG SILVER LF 16FR (SET/KITS/TRAYS/PACK) ×3 IMPLANT
TRAY FOLEY W/METER SILVER 14FR (SET/KITS/TRAYS/PACK) IMPLANT
TRAY LAPAROSCOPIC (CUSTOM PROCEDURE TRAY) ×3 IMPLANT
TROCAR BLADELESS OPT 5 100 (ENDOMECHANICALS) ×3 IMPLANT
UNDERPAD 30X30 INCONTINENT (UNDERPADS AND DIAPERS) ×3 IMPLANT
WATER STERILE IRR 1500ML POUR (IV SOLUTION) ×6 IMPLANT

## 2016-03-24 NOTE — Transfer of Care (Signed)
Immediate Anesthesia Transfer of Care Note  Patient: Brenda Cunningham  Procedure(s) Performed: Procedure(s): XI ROBOTIC ASSISTED TOTA LAPAROSCOPIC  HYSTERECTOMY WITH BILATERAL SALPINGO OOPHORECTOMY (Bilateral) OMENTECTOMY (N/A) RADICAL TUMOR DEBULKING (N/A)  Patient Location: PACU  Anesthesia Type:General  Level of Consciousness:  sedated, patient cooperative and responds to stimulation  Airway & Oxygen Therapy:Patient Spontanous Breathing and Patient connected to face mask oxgen  Post-op Assessment:  Report given to PACU RN and Post -op Vital signs reviewed and stable  Post vital signs:  Reviewed and stable  Last Vitals:  Filed Vitals:   03/24/16 1042  BP: 120/75  Pulse: 81  Temp: 37.2 C  Resp: 18    Complications: No apparent anesthesia complications

## 2016-03-24 NOTE — Anesthesia Procedure Notes (Signed)
Procedure Name: Intubation Date/Time: 03/24/2016 12:29 PM Performed by: Dione Booze Pre-anesthesia Checklist: Emergency Drugs available, Suction available, Patient being monitored and Patient identified Patient Re-evaluated:Patient Re-evaluated prior to inductionOxygen Delivery Method: Circle system utilized Preoxygenation: Pre-oxygenation with 100% oxygen Intubation Type: IV induction Ventilation: Mask ventilation without difficulty Laryngoscope Size: Mac and 4 Grade View: Grade II Tube type: Oral Tube size: 7.5 mm Number of attempts: 1 Airway Equipment and Method: Stylet Placement Confirmation: ETT inserted through vocal cords under direct vision and positive ETCO2 Secured at: 21 cm Tube secured with: Tape Dental Injury: Teeth and Oropharynx as per pre-operative assessment

## 2016-03-24 NOTE — H&P (View-Only) (Signed)
Ovarian Cancer Follow-up Note  Consult was initially requested by Dr. Venora Maples for the evaluation of Liz Bahar 65 y.o. female with presumed metastatic ovarian cancer  CC:  Chief Complaint  Patient presents with  . Ovarian Cancer    MD follow up visit    Assessment/Plan:  Ms. Marrion Boyd  is a 65 y.o.  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  Good response to therapy on CT findings and CA 125 (normalized). Progressive neurologic phenomena (possible seizure, weakness LE's).   I am recommending interval cytoreduction. Based on my evaluation of the CT images, I believe she is a good candidate for a minimally invasive approach. We will schedule the surgery as a robotic assisted total hysterectomy, BSO, omentectomy, tumor debulking, possible laparotomy.  A minimally invasive approach may result in less recovery and limitations postop in this patient who has neurologic impairments.  I am recommending at least 72 hours off eloquis given the extensive nature of her surgery. I discussed operative risks with the patient and her daughter including bleeding complications (intra and postop), infection, damage to internal organs, conversion to laparotomy, VTE events, neurologic events (eg stroke), postop deconditioning requiring NH placement.  I discussed that the role of surgery is to debulk or reduce the volume of disease and is not able to completely resect all disease due to her diffusely metastatic picture. I explained that additional cycles of chemotherapy are essential after the surgery or no benefit will be derived.  HPI: Kailany Wachtel is a 65 year old G6P6 who is seen in consultation at the request of ED physician, Dr Jola Schmidt for carcinomatosis. The patient reports having a stroke in August 2016 that resulted in expressive aphasia and right upper extremity weakness.  She has been treated for that with  aspirin and L a close and is undergoing physical therapy and speech pathology.  The patient began developing vague left lower quadrant and hip discomfort and was seen in the emergency room on 11/29/2015. A CT scan of the abdomen and pelvis was ordered and revealed an indistinct uterus secondary to surrounding peritoneal masses. Internal coarse calcifications consistent with hyalinized fibroids. Bilateral ovaries enlarged and heterogeneous measuring up to 4 cm is on the right. Bilateral pelvic and inguinal adenopathy which appears malignant with a left inguinal lymph node measuring 1.8 mm and a right external iliac lymph node measuring 18 mm. The retroperitoneal adenopathy extensive pelvic brim. Innumerable masses clustered in the pelvis with interleukin peritoneal thickening and small ascites. The omentum has a started appearance.  The patient denies early satiety and reports mild and vague abdominal bloating. She has a remote history of breast cancer in 2008 for which she was treated with lumpectomy, chemotherapy, and radiation. She is no family history significant for breast or ovarian cancer. She is otherwise treated for atrial fibrillation and has had a remote history of TIAs in 2010. In 2016 she underwent more significant cerebrovascular event which resulted in expressive aphasia and right upper extremity weakness. She also has hypertension and arthritis. Her prior surgical history significant for a left breast lumpectomy, appendectomy, tubal ligation. She's had 6 vaginal deliveries in the past.  She denies vaginal bleeding or history of abnormal Pap smears.  Interval Hx: On 12/17/15 she underwent biopsy of the inguinal node which revealed high grade serous carcinoma. She commenced neoadjuvant chemotherapy on 12/27/15 with dose dense carboplatin and paclitaxel. Day 1 of cycle 3 was on  02/14/16.  After cycle 3 she began experiencing profound weakness in both lower extremities and requires maximal  assistance at home now with mobility. She was evaluated by her neurologist, Dr Dellis Filbert on 03/03/16 who felt that she may have focal seizure vs syncopal episode. She is scheduled for an EEG on 03/17/16.  CT of the chest abdo and pelvis on 03/02/16 showed excellent response to therapy with resolution of ascites, regression of previously noted peritoneal implants and regression of lymphadenopathy in the abdomen and pelvis. There is continued soft tissue thickening along the perineal surfaces with the largest residual implant being 1.2 x 2.3 cm in the central low anatomic pelvis, and a 1.3 x 0.9 cm lesion in the low left paracolic gutter. The ovaries measure for 0.0 x 2.6 x 4.7 cm on the right and 1.8 x 2.3 x 2.37 m on the left. The uterus is bulky and enlarged with fibroids.  On 02/28/16 CA 125 normalized to 33.6  Current Meds:  Outpatient Encounter Prescriptions as of 03/09/2016  Medication Sig  . acetaminophen (TYLENOL) 500 MG tablet Take 500-1,000 mg by mouth every 6 (six) hours as needed. Reported on 02/03/2016  . ALPRAZolam (XANAX) 0.25 MG tablet Take 1 tablet by mouth up to 2-3 times a day PRN anxiety  . apixaban (ELIQUIS) 5 MG TABS tablet Take 1 tablet (5 mg total) by mouth 2 (two) times daily.  Marland Kitchen aspirin EC 81 MG tablet Take 81 mg by mouth daily.  Marland Kitchen atorvastatin (LIPITOR) 40 MG tablet Take 40 mg by mouth at bedtime.  Marland Kitchen dexamethasone (DECADRON) 4 MG tablet Take 5 tablets =20 mg with food 12 hrs before Taxol Chemotherapy (Patient taking differently: Take 20 mg by mouth See admin instructions. Take 5 tablets =20 mg with food 12 hrs before Taxol Chemotherapy)  . lidocaine-prilocaine (EMLA) cream Apply to Porta-Cath 1-2 hours prior to access as directed. (Patient taking differently: Apply 1 application topically See admin instructions. Apply to Porta-Cath 1-2 hours prior to access as directed.)  . loratadine (CLARITIN) 10 MG tablet Take 10 mg by mouth daily as needed (Recommended for chemo pain).   .  LORazepam (ATIVAN) 0.5 MG tablet Place 1 tablet under the tongue or swallow every 6 hours as needed for nausea. Will make you drowsy.  . metoprolol (LOPRESSOR) 50 MG tablet Take 50 mg by mouth 2 (two) times daily.  . Multiple Vitamins-Minerals (MULTIVITAMIN WITH MINERALS) tablet Take 1 tablet by mouth daily.  Marland Kitchen neomycin-bacitracin-polymyxin (NEOSPORIN) ointment Place 1 application into the nose daily as needed. apply to each nostril once daily PRN if nose bleeds after blowing  . omega-3 acid ethyl esters (LOVAZA) 1 g capsule Take 1 capsule by mouth once daily  . ondansetron (ZOFRAN) 8 MG tablet Take 1 tablet (8 mg total) by mouth every 8 (eight) hours as needed for nausea or vomiting (Will not make drowsy.).  Marland Kitchen PARoxetine (PAXIL) 20 MG tablet TAKE 1 TABLET BY MOUTH DAILY  . prochlorperazine (COMPAZINE) 10 MG tablet Take 1 tablet (10 mg total) by mouth every 6 (six) hours as needed for nausea or vomiting.  . verapamil (CALAN-SR) 240 MG CR tablet Take 1 tablet (240 mg total) by mouth daily.   No facility-administered encounter medications on file as of 03/09/2016.    Allergy:  Allergies  Allergen Reactions  . Codeine Hives and Itching    Social Hx:   Social History   Social History  . Marital Status: Single    Spouse Name: N/A  . Number  of Children: N/A  . Years of Education: N/A   Occupational History  . Not on file.   Social History Main Topics  . Smoking status: Never Smoker   . Smokeless tobacco: Not on file  . Alcohol Use: No  . Drug Use: No  . Sexual Activity: Not on file   Other Topics Concern  . Not on file   Social History Narrative   Lives with her female friend, Mina Marble, walks for exercise.  Originally from [place].    Past Surgical Hx:  Past Surgical History  Procedure Laterality Date  . Breast lumpectomy  2008    left  . Appendectomy    . Tubal ligation    . Colonoscopy  2004    Past Medical Hx:  Past Medical History  Diagnosis Date  . Hypertension    . Arthritis   . A-fib ([place])   . Hyperlipidemia   . Stroke ([place]) 2010  . Breast cancer ([place]) 2008    s/p lumpectomy, chemo and radiation therapy  . Wears glasses   . Anxiety     Past Gynecological History:  SVD x 6  No LMP recorded. Patient is postmenopausal.  Family Hx:  Family History  Problem Relation Age of Onset  . Family history unknown: Yes    Review of Systems:  Constitutional  Feels well,    ENT Normal appearing ears and nares bilaterally Skin/Breast  No rash, sores, jaundice, itching, dryness Cardiovascular  No chest pain, shortness of breath, or edema  Pulmonary  No cough or wheeze.  Gastro Intestinal  No nausea, vomitting, or diarrhoea. No bright red blood per rectum, no abdominal pain, change in bowel movement, or constipation.  Genito Urinary  No frequency, urgency, dysuria,  Musculo Skeletal  Left chest wall discomfort Neurologic  No weakness, numbness, change in gait,  Psychology  No depression, anxiety, insomnia.   Vitals:  Blood pressure 103/61, pulse 64, temperature 97.9 F (36.6 C), temperature source Oral, resp. rate 18, height 5' 8.5" (1.74 m), weight 182 lb 14.4 oz (82.963 kg), SpO2 100 %.  Physical Exam: WD in NAD Neck  Supple NROM, without any enlargements.  Lymph Node Survey No cervical supraclavicular adenopathy. Left inguinal lymph node is no longer palpable Cardiovascular  Pulse normal rate, regularity and rhythm. S1 and S2 normal.  Lungs  Clear to auscultation bilateraly, without wheezes/crackles/rhonchi. Good air movement.  Skin  No rash/lesions/breakdown  Psychiatry  Alert and oriented to person, place, and time  Abdomen  Normoactive bowel sounds, abdomen soft, non-tender and overweight without evidence of hernia. Slightly distended. No masses. Back No CVA tenderness Genito Urinary  Vulva/vagina: Normal external female genitalia.  No lesions. No discharge or bleeding.  Bladder/urethra:  No lesions or masses, well  supported bladder  Vagina: nodule palpable behind the cervix.  Cervix: Normal appearing, no lesions.  Uterus: No dominant mass in pelvis.  Adnexa: as above . Rectal  Good tone, no masses no cul de sac nodularity.  Extremities  No bilateral cyanosis, clubbing or edema.  25 minutes of face to face counseling time was spent wit the patient Donaciano Eva, MD  03/09/2016, 5:35 PM

## 2016-03-24 NOTE — Anesthesia Postprocedure Evaluation (Signed)
Anesthesia Post Note  Patient: Brenda Cunningham  Procedure(s) Performed: Procedure(s) (LRB): XI ROBOTIC ASSISTED TOTA LAPAROSCOPIC  HYSTERECTOMY WITH BILATERAL SALPINGO OOPHORECTOMY (Bilateral) OMENTECTOMY (N/A) RADICAL TUMOR DEBULKING (N/A)  Patient location during evaluation: PACU Anesthesia Type: General Level of consciousness: awake and alert Pain management: pain level controlled Vital Signs Assessment: post-procedure vital signs reviewed and stable Respiratory status: spontaneous breathing, nonlabored ventilation, respiratory function stable and patient connected to nasal cannula oxygen Cardiovascular status: blood pressure returned to baseline and stable Postop Assessment: no signs of nausea or vomiting Anesthetic complications: no    Last Vitals:  Filed Vitals:   03/24/16 1758 03/24/16 1844  BP: 134/80 134/78  Pulse: 76 75  Temp: 36.4 C 36.7 C  Resp: 15 15    Last Pain:  Filed Vitals:   03/24/16 1845  PainSc: 7                  Toria Monte J

## 2016-03-24 NOTE — Discharge Instructions (Signed)
03/24/2016  Return to work: 4 weeks DO NOT RESTART ELOQUIS UNTIL RECOMMENDED BY YOUR SURGEON  Activity: 1. Be up and out of the bed during the day.  Take a nap if needed.  You may walk up steps but be careful and use the hand rail.  Stair climbing will tire you more than you think, you may need to stop part way and rest.   2. No lifting or straining for 6 weeks.  3. No driving for 1-2 weeks.  Do Not drive if you are taking narcotic pain medicine.  4. Shower daily.  Use soap and water on your incision and pat dry; don't rub.   5. No sexual activity and nothing in the vagina for 6 weeks.  Diet: 1. Low sodium Heart Healthy Diet is recommended.  2. It is safe to use a laxative if you have difficulty moving your bowels.   Wound Care: 1. Keep clean and dry.  Shower daily.  Reasons to call the Doctor:   Fever - Oral temperature greater than 100.4 degrees Fahrenheit  Foul-smelling vaginal discharge  Difficulty urinating  Nausea and vomiting  Increased pain at the site of the incision that is unrelieved with pain medicine.  Difficulty breathing with or without chest pain  New calf pain especially if only on one side  Sudden, continuing increased vaginal bleeding with or without clots.   Follow-up: 1. You will return to the cancer center for a lab draw in 1 week. DO NOT TAKE YOUR ELOQUIS UNTIL AFTER THIS LAB HAS BEEN CHECKED 2. See Everitt Amber in 2-3 weeks.  Contacts: For questions or concerns you should contact:  Dr. Everitt Amber at [phone]  or at [facility]

## 2016-03-24 NOTE — Anesthesia Preprocedure Evaluation (Addendum)
Anesthesia Evaluation  Patient identified by MRN, date of birth, ID band Patient awake    Reviewed: Allergy & Precautions, NPO status , Patient's Chart, lab work & pertinent test results, reviewed documented beta blocker date and time   Airway Mallampati: II  TM Distance: >3 FB Neck ROM: Full    Dental  (+) Dental Advisory Given   Pulmonary neg pulmonary ROS,    breath sounds clear to auscultation       Cardiovascular hypertension, Pt. on medications and Pt. on home beta blockers + dysrhythmias Atrial Fibrillation  Rhythm:Regular Rate:Normal  07/2015 Echo: The left ventricular ejection fraction is normal (60-65%).The left ventricular wall motion is normal.Injection of contrast documented no interatrial shunt .The aortic valve is trileaflet with thin, pliable leaflets that movenormally.There is moderate (2+) tricuspid regurgitation.The left atrium is moderately dilated.Right ventricular systolic pressure is elevated between 40-78mm Hg,consistent with moderate pulmonary hypertension.There is mild (1+) mitral regurgitation.Unable to adequately determine diastolic dysfunction.   Neuro/Psych Anxiety CVA    GI/Hepatic negative GI ROS, Neg liver ROS,   Endo/Other    Renal/GU negative Renal ROS     Musculoskeletal  (+) Arthritis ,   Abdominal   Peds  Hematology  (+) anemia ,   Anesthesia Other Findings   Reproductive/Obstetrics                            Lab Results  Component Value Date   WBC 7.0 03/16/2016   HGB 9.5* 03/16/2016   HCT 29.5* 03/16/2016   MCV 100.3 03/16/2016   PLT 248 03/16/2016   Lab Results  Component Value Date   CREATININE 0.7 03/16/2016   BUN 9.3 03/16/2016   NA 140 03/16/2016   K 4.1 03/16/2016   CL 108 03/14/2016   CO2 26 03/16/2016   Lab Results  Component Value Date   INR 1.30 01/29/2016   INR 1.40 12/17/2015   INR 1.49 07/29/2015    Anesthesia  Physical Anesthesia Plan  ASA: III  Anesthesia Plan: General   Post-op Pain Management:    Induction: Intravenous  Airway Management Planned: Oral ETT  Additional Equipment:   Intra-op Plan:   Post-operative Plan: Extubation in OR  Informed Consent: I have reviewed the patients History and Physical, chart, labs and discussed the procedure including the risks, benefits and alternatives for the proposed anesthesia with the patient or authorized representative who has indicated his/her understanding and acceptance.   Dental advisory given  Plan Discussed with: CRNA  Anesthesia Plan Comments:         Anesthesia Quick Evaluation

## 2016-03-24 NOTE — Op Note (Signed)
OPERATIVE NOTE 03/24/16  Surgeon: Donaciano Eva   Assistants: Dr Lahoma Crocker (an MD assistant was necessary for tissue manipulation, management of robotic instrumentation, retraction and positioning due to the complexity of the case and hospital policies).   Anesthesia: General endotracheal anesthesia  ASA Class: 3   Pre-operative Diagnosis: stage IV ovarian cancer, s/p 3 cycles neoadjuvant chemotherapy  Post-operative Diagnosis: same  Operation: Robotic-assisted laparoscopic total hysterectomy with bilateral salpingoophorectomy, omentectomy, radical tumor debulking  Surgeon: Donaciano Eva  Assistant Surgeon: Lahoma Crocker MD  Anesthesia: GET  Urine Output: 200  Operative Findings:  : small nodules in omentum, no ascites. Omental nodule (2cm) adherent to lower anterior abdominal wall. 2cm left colonic gutter cystic nodule. 6cm right external iliac lymph node. Grossly normal ovaries and tubes. Fibroid uterus. No gross residual disease at completion of surgery representing R0 optimal/complete resection.  Estimated Blood Loss:  200 mL      Total IV Fluids: 700 ml         Specimens: omentum, uterus, cervix, bilateral tubes and ovaries, right external iliac lymph node, left paracolic gutter nodule.          Complications:  None; patient tolerated the procedure well.         Disposition: PACU - hemodynamically stable.  Procedure Details  The patient was seen in the Holding Room. The risks, benefits, complications, treatment options, and expected outcomes were discussed with the patient.  The patient concurred with the proposed plan, giving informed consent.  The site of surgery properly noted/marked. The patient was identified as Raffie Fridman and the procedure verified as a Robotic-assisted hysterectomy with bilateral salpingo oophorectomy, omentectomy, debulking. A Time Out was held and the above information confirmed.  After induction of anesthesia, the  patient was draped and prepped in the usual sterile manner. Pt was placed in supine position after anesthesia and draped and prepped in the usual sterile manner. The abdominal drape was placed after the CholoraPrep had been allowed to dry for 3 minutes.  Her arms were tucked to her side with all appropriate precautions.  The shoulders were stabilized with padded shoulder blocks applied to the acromium processes.  The patient was placed in the semi-lithotomy position in [place].  The perineum was prepped with Betadine. The patient was then prepped. Foley catheter was placed.  A sterile speculum was placed in the vagina.  The cervix was grasped with a single-tooth tenaculum and dilated with Kennon Rounds dilators.  The ZUMI uterine manipulator with a medium colpotomizer ring was placed without difficulty.  A pneum occluder balloon was placed over the manipulator.  OG tube placement was confirmed and to suction.   Next, a 5 mm skin incision was made 1 cm below the subcostal margin in the midclavicular line.  The 5 mm Optiview port and scope was used for direct entry.  Opening pressure was under 10 mm CO2.  The abdomen was insufflated and the findings were noted as above.   At this point and all points during the procedure, the patient's intra-abdominal pressure did not exceed 15 mmHg. Next, a 10 mm skin incision was made 2cm above the umbilicus and a right and left port was placed about 10 cm lateral to the robot port on the right and left side.  A fourth arm was placed in the left lower quadrant 2 cm above and superior and medial to the anterior superior iliac spine.  All ports were placed under direct visualization.  The patient was placed  in steep Trendelenburg.  Bowel was folded away into the upper abdomen.  The robot was docked in the normal manner.  The omentectomy was performed by using the forth arm to elevate the omentum in the mid abdomen, identification of the transverse colon, then opening the parietal  peritoneum of the omentum which exposed the transverse colon. The omentectomy was then perofrmed to the left/splenic flexure by creating windows and vascular pedicles from the omentum as it was suspended from the transverse colon. These pedicles were then bipolar sealed and transected. A similar technique was then employed to separate the right omentum from the hepatic flexure. The omental nodule in the midline which was adherent to the mid lower anterior abdominal wall was then resected from the anterior abdominal wall and the omentum was placed in an endocatch bag and later retrieved from the vaginal colpotomy.  The hysterectomy was started after the round ligament on the right side was incised and the retroperitoneum was entered and the pararectal space was developed. This was a difficult dissection because it became apparent that there was a 6cm grossly enlarged/involved external iliac lymph node in the retroperitoneum which adhered the ureter to the side wall. The ureter was noted to be on the medial leaf of the broad ligament.  It was sharply dissected from its attachments to the lymph node and the para-rectal space was opened. The peritoneum above the ureter was incised and stretched and the infundibulopelvic ligament was skeletonized, cauterized and cut.  The posterior peritoneum was taken down to the level of the KOH ring.  The anterior peritoneum was also taken down.  The bladder flap was created to the level of the KOH ring.  The uterine artery on the right side was skeletonized, cauterized and cut in the normal manner.  A similar procedure was performed on the left.  The colpotomy was made and the uterus, cervix, bilateral ovaries and tubes were amputated and delivered through the vagina.  Pedicles were inspected and excellent hemostasis was achieved.    The right external iliac lymph node was dissected from its attachments to the iliac vessels by first opening up the right paravessical space, and  identifying the right obturator nerve and vessels. The node was then gently grasped and skeletonized from its attachments to the external iliac artery and vein with care to observe the course of the obturator nerve and medial mobilization of the ureter. The node was then placed in an endocatch bag for retrieval through the colpotomy.  The left para-colic gutter nodule was skeletonized by opening up the physiologic attachments between the colon and the sidewall. The nodule was grasped and separated from the sidewall peritoneum with monopolar energy.  The colpotomy at the vaginal cuff was closed with Vicryl on a CT1 needle in a running manner.  Irrigation was used and excellent hemostasis was achieved.  At this point in the procedure was completed.  Robotic instruments were removed under direct visulaization.  The robot was undocked. The 10 mm ports were closed with Vicryl on a UR-5 needle and the fascia was closed with 0 Vicryl on a UR-5 needle.  The skin was closed with 4-0 Vicryl in a subcuticular manner.  Dermabond was applied and pressure dressings to oozing incisions.  Sponge, lap and needle counts correct x 2.  The patient was taken to the recovery room in stable condition.  The vagina was swabbed with  minimal bleeding noted.   All instrument and needle counts were correct x  3.  The patient was transferred to the recovery room in a stable condition.  Donaciano Eva, MD

## 2016-03-24 NOTE — Interval H&P Note (Signed)
History and Physical Interval Note:  03/24/2016 11:21 AM  Brenda Cunningham  has presented today for surgery, with the diagnosis of OVARIAN CANCER  The various methods of treatment have been discussed with the patient and family. After consideration of risks, benefits and other options for treatment, the patient has consented to  Procedure(s): XI ROBOTIC Echo (Bilateral) OMENTECTOMY (N/A) DEBULKING (N/A) POSSIBLE EXPLORATORY LAPAROTOMY (N/A) as a surgical intervention .  The patient's history has been reviewed, patient examined, no change in status, stable for surgery.  I have reviewed the patient's chart and labs.  Questions were answered to the patient's satisfaction.     Donaciano Eva

## 2016-03-25 ENCOUNTER — Other Ambulatory Visit: Payer: Self-pay | Admitting: Gynecologic Oncology

## 2016-03-25 ENCOUNTER — Encounter (HOSPITAL_COMMUNITY): Payer: Self-pay | Admitting: Gynecologic Oncology

## 2016-03-25 DIAGNOSIS — C563 Malignant neoplasm of bilateral ovaries: Secondary | ICD-10-CM

## 2016-03-25 DIAGNOSIS — C562 Malignant neoplasm of left ovary: Secondary | ICD-10-CM | POA: Diagnosis not present

## 2016-03-25 DIAGNOSIS — C561 Malignant neoplasm of right ovary: Secondary | ICD-10-CM

## 2016-03-25 LAB — BASIC METABOLIC PANEL
Anion gap: 7 (ref 5–15)
BUN: 7 mg/dL (ref 6–20)
CO2: 25 mmol/L (ref 22–32)
Calcium: 9.8 mg/dL (ref 8.9–10.3)
Chloride: 109 mmol/L (ref 101–111)
Creatinine, Ser: 0.68 mg/dL (ref 0.44–1.00)
GFR calc Af Amer: >60 mL/min (ref >60)
GFR calc non Af Amer: >60 mL/min (ref >60)
Glucose, Bld: 147 mg/dL — ABNORMAL HIGH (ref 65–99)
Potassium: 4.5 mmol/L (ref 3.5–5.1)
Sodium: 141 mmol/L (ref 135–145)

## 2016-03-25 LAB — CBC
HCT: 29.6 % — ABNORMAL LOW (ref 36.0–46.0)
Hemoglobin: 9.9 g/dL — ABNORMAL LOW (ref 12.0–15.0)
MCH: 33.9 pg (ref 26.0–34.0)
MCHC: 33.4 g/dL (ref 30.0–36.0)
MCV: 101.4 fL — ABNORMAL HIGH (ref 78.0–100.0)
Platelets: 263 10*3/uL (ref 150–400)
RBC: 2.92 MIL/uL — ABNORMAL LOW (ref 3.87–5.11)
RDW: 22.4 % — ABNORMAL HIGH (ref 11.5–15.5)
WBC: 10.9 10*3/uL — ABNORMAL HIGH (ref 4.0–10.5)

## 2016-03-25 MED ORDER — DOCUSATE SODIUM 100 MG PO CAPS: 100.0000 mg | ORAL_CAPSULE | Freq: Two times a day (BID) | ORAL | Status: DC

## 2016-03-25 MED ORDER — TRAMADOL HCL 50 MG PO TABS: 100.0000 mg | ORAL_TABLET | Freq: Two times a day (BID) | ORAL | Status: DC | PRN

## 2016-03-25 NOTE — Discharge Summary (Signed)
Physician Discharge Summary  Patient ID: Brenda Cunningham MRN: [id number] DOB/AGE: 67-Jan-1952 67 y.o.  Admit date: 03/24/2016 Discharge date: 03/25/2016  Admission Diagnoses: [employer] (FIGO) stage IVB epithelial ovarian cancer [facility])  Discharge Diagnoses:  Principal Problem:   [employer] (FIGO) stage IVB epithelial ovarian cancer [facility]) Active Problems:   Ovarian cancer [facility])   Discharged Condition: good  Hospital Course: patient was admitted for surgery on 03/24/16. She underwent a robotic assisted total hysterectomy, BSO, omentectomy, lymph node resection and radical tumor debulking for stage IV ovarian cancer s/p 3 cycles of neoadjuvant chemotherapy. She had held her eloquis for 3 days preop but still had greater than expected EBL from generalized ooze consistent with medication induced coagulopathy. Post op H&H was appropriate and her pain was very well controlled on oral agents. She was meeting d.c. Criteria on POD 1.  She will not be discharged to home on Lovenox due to her coagulopathy and concern for postop bleeding. However, we will recheck her H&H in the office in 1 week and if stable, will restart Eloquis.  Consults: None  Significant Diagnostic Studies: labs:  CBC    Component Value Date/Time   WBC 10.9* 03/25/2016 0429   WBC 7.0 03/16/2016 0925   RBC 2.92* 03/25/2016 0429   RBC 2.94* 03/16/2016 0925   HGB 9.9* 03/25/2016 0429   HGB 9.5* 03/16/2016 0925   HCT 29.6* 03/25/2016 0429   HCT 29.5* 03/16/2016 0925   PLT 263 03/25/2016 0429   PLT 248 03/16/2016 0925   MCV 101.4* 03/25/2016 0429   MCV 100.3 03/16/2016 0925   MCH 33.9 03/25/2016 0429   MCH 32.3 03/16/2016 0925   MCHC 33.4 03/25/2016 0429   MCHC 32.2 03/16/2016 0925   RDW 22.4* 03/25/2016 0429   RDW 25.6* 03/16/2016 0925   LYMPHSABS 1.7 03/16/2016 0925   LYMPHSABS 1.8 03/14/2016 0820   MONOABS 1.2* 03/16/2016 0925   MONOABS 1.1* 03/14/2016 0820   EOSABS 0.1 03/16/2016 0925   EOSABS 0.1 03/14/2016 0820   BASOSABS 0.0 03/16/2016 0925   BASOSABS 0.0 03/14/2016 0820      Treatments: surgery: see above  Discharge Exam: Blood pressure 129/81, pulse 79, temperature 98 F (36.7 C), temperature source Oral, resp. rate 15, height 5\' 10"  (1.778 m), weight 189 lb (85.73 kg), SpO2 100 %. General appearance: alert and cooperative Resp: clear to auscultation bilaterally Cardio: regular rate and rhythm, S1, S2 normal, no murmur, click, rub or gallop GI: soft, non-tender; bowel sounds normal; no masses,  no organomegaly Incision/Wound: clean/dry and intact incisions. Slight vaginal spotting only.  Disposition: 01-Home or Self Care  Discharge Instructions    (HEART FAILURE PATIENTS) Call MD:  Anytime you have any of the following symptoms: 1) 3 pound weight gain in 24 hours or 5 pounds in 1 week 2) shortness of breath, with or without a dry hacking cough 3) swelling in the hands, feet or stomach 4) if you have to sleep on extra pillows at night in order to breathe.    Complete by:  As directed      Call MD for:  difficulty breathing, headache or visual disturbances    Complete by:  As directed      Call MD for:  extreme fatigue    Complete by:  As directed      Call MD for:  hives    Complete by:  As directed      Call MD for:  persistant  dizziness or light-headedness    Complete by:  As directed      Call MD for:  persistant nausea and vomiting    Complete by:  As directed      Call MD for:  redness, tenderness, or signs of infection (pain, swelling, redness, odor or green/yellow discharge around incision site)    Complete by:  As directed      Call MD for:  severe uncontrolled pain    Complete by:  As directed      Call MD for:  temperature >100.4    Complete by:  As directed      Diet - low sodium heart healthy    Complete by:  As directed      Diet general    Complete by:  As directed       Driving Restrictions    Complete by:  As directed   No driving for 7 days or until off narcotic pain medication     Increase activity slowly    Complete by:  As directed      Remove dressing in 24 hours    Complete by:  As directed      Sexual Activity Restrictions    Complete by:  As directed   No intercourse for 6 weeks            Medication List    TAKE these medications        acetaminophen 500 MG tablet  Commonly known as:  TYLENOL  Take 500-1,000 mg by mouth every 6 (six) hours as needed for moderate pain. Reported on 02/03/2016     ALPRAZolam 0.25 MG tablet  Commonly known as:  XANAX  Take 0.25 mg by mouth 3 (three) times daily as needed for anxiety.     apixaban 5 MG Tabs tablet  Commonly known as:  ELIQUIS  Take 1 tablet (5 mg total) by mouth 2 (two) times daily.     aspirin EC 81 MG tablet  Take 81 mg by mouth daily.     atorvastatin 40 MG tablet  Commonly known as:  LIPITOR  Take 40 mg by mouth at bedtime.     dexamethasone 4 MG tablet  Commonly known as:  DECADRON  Take 5 tablets =20 mg with food 12 hrs before Taxol Chemotherapy     docusate sodium 100 MG capsule  Commonly known as:  COLACE  Take 1 capsule (100 mg total) by mouth 2 (two) times daily.     ferrous fumarate 325 (106 Fe) MG Tabs tablet  Commonly known as:  HEMOCYTE - 106 mg FE  Take 1 tablet daly on an empty stomach with OJ or Vitamin C     lidocaine-prilocaine cream  Commonly known as:  EMLA  Apply to Porta-Cath 1-2 hours prior to access as directed.     loratadine 10 MG tablet  Commonly known as:  CLARITIN  Take 10 mg by mouth daily as needed (Recommended for chemo pain). Reported on 03/16/2016     LORazepam 0.5 MG tablet  Commonly known as:  ATIVAN  Place 1 tablet under the tongue or swallow every 6 hours as needed for nausea. Will make you drowsy.     metoprolol tartrate 25 MG tablet  Commonly known as:  LOPRESSOR  25 mg 2 (two) times daily.     multivitamin with minerals  tablet  Take 1 tablet by mouth daily.     neomycin-bacitracin-polymyxin ointment  Commonly known as:  NEOSPORIN  Place 1 application into the nose daily as needed. apply to each nostril once daily PRN if nose bleeds after blowing     omega-3 acid ethyl esters 1 g capsule  Commonly known as:  LOVAZA  Take 1 g by mouth daily.     ondansetron 8 MG tablet  Commonly known as:  ZOFRAN  Take 1 tablet (8 mg total) by mouth every 8 (eight) hours as needed for nausea or vomiting (Will not make drowsy.).     PARoxetine 20 MG tablet  Commonly known as:  PAXIL  TAKE 1 TABLET BY MOUTH DAILY     prochlorperazine 10 MG tablet  Commonly known as:  COMPAZINE  Take 1 tablet (10 mg total) by mouth every 6 (six) hours as needed for nausea or vomiting.     traMADol 50 MG tablet  Commonly known as:  ULTRAM  Take 1 tablet (50 mg total) by mouth every 6 (six) hours as needed.     traMADol 50 MG tablet  Commonly known as:  ULTRAM  Take 2 tablets (100 mg total) by mouth every 12 (twelve) hours as needed for moderate pain.     verapamil 240 MG CR tablet  Commonly known as:  CALAN-SR  Take 1 tablet (240 mg total) by mouth daily.           Follow-up Information    Follow up with Lab appt On 03/31/2016.   Why:  Lab appointment at the [place] at 10:45am.      Follow up with Donaciano Eva, MD On 04/10/2016.   Specialty:  Obstetrics and Gynecology   Why:  at 12:15pm at the [facility] information:   [place] [address] [phone]       Signed: Donaciano Eva 03/25/2016, 8:53 AM

## 2016-03-27 DIAGNOSIS — F329 Major depressive disorder, single episode, unspecified: Secondary | ICD-10-CM | POA: Diagnosis not present

## 2016-03-27 DIAGNOSIS — I4891 Unspecified atrial fibrillation: Secondary | ICD-10-CM | POA: Diagnosis not present

## 2016-03-27 DIAGNOSIS — I69351 Hemiplegia and hemiparesis following cerebral infarction affecting right dominant side: Secondary | ICD-10-CM | POA: Diagnosis not present

## 2016-03-27 DIAGNOSIS — R2689 Other abnormalities of gait and mobility: Secondary | ICD-10-CM | POA: Diagnosis not present

## 2016-03-27 DIAGNOSIS — I10 Essential (primary) hypertension: Secondary | ICD-10-CM | POA: Diagnosis not present

## 2016-03-27 DIAGNOSIS — C50919 Malignant neoplasm of unspecified site of unspecified female breast: Secondary | ICD-10-CM | POA: Diagnosis not present

## 2016-03-30 ENCOUNTER — Ambulatory Visit: Payer: Medicare Other | Admitting: Gynecologic Oncology

## 2016-03-30 DIAGNOSIS — I4891 Unspecified atrial fibrillation: Secondary | ICD-10-CM | POA: Diagnosis not present

## 2016-03-30 DIAGNOSIS — F329 Major depressive disorder, single episode, unspecified: Secondary | ICD-10-CM | POA: Diagnosis not present

## 2016-03-30 DIAGNOSIS — I10 Essential (primary) hypertension: Secondary | ICD-10-CM | POA: Diagnosis not present

## 2016-03-30 DIAGNOSIS — R2689 Other abnormalities of gait and mobility: Secondary | ICD-10-CM | POA: Diagnosis not present

## 2016-03-30 DIAGNOSIS — C50919 Malignant neoplasm of unspecified site of unspecified female breast: Secondary | ICD-10-CM | POA: Diagnosis not present

## 2016-03-30 DIAGNOSIS — I69351 Hemiplegia and hemiparesis following cerebral infarction affecting right dominant side: Secondary | ICD-10-CM | POA: Diagnosis not present

## 2016-03-31 ENCOUNTER — Other Ambulatory Visit: Payer: Self-pay | Admitting: Gynecologic Oncology

## 2016-03-31 ENCOUNTER — Telehealth: Payer: Self-pay

## 2016-03-31 ENCOUNTER — Ambulatory Visit (HOSPITAL_COMMUNITY)
Admission: RE | Admit: 2016-03-31 | Discharge: 2016-03-31 | Disposition: A | Discharge status: Home / Self Care | Payer: Medicare Other | Source: Ambulatory Visit | Attending: Gynecologic Oncology | Admitting: Gynecologic Oncology

## 2016-03-31 ENCOUNTER — Other Ambulatory Visit (HOSPITAL_BASED_OUTPATIENT_CLINIC_OR_DEPARTMENT_OTHER): Payer: Medicare Other

## 2016-03-31 DIAGNOSIS — M79605 Pain in left leg: Secondary | ICD-10-CM | POA: Insufficient documentation

## 2016-03-31 DIAGNOSIS — M79602 Pain in left arm: Secondary | ICD-10-CM

## 2016-03-31 DIAGNOSIS — C562 Malignant neoplasm of left ovary: Secondary | ICD-10-CM

## 2016-03-31 DIAGNOSIS — C561 Malignant neoplasm of right ovary: Secondary | ICD-10-CM | POA: Diagnosis present

## 2016-03-31 DIAGNOSIS — C563 Malignant neoplasm of bilateral ovaries: Secondary | ICD-10-CM

## 2016-03-31 DIAGNOSIS — M7989 Other specified soft tissue disorders: Secondary | ICD-10-CM | POA: Insufficient documentation

## 2016-03-31 LAB — CBC WITH DIFFERENTIAL/PLATELET
BASO%: 0.4 % (ref 0.0–2.0)
Basophils Absolute: 0 10*3/uL (ref 0.0–0.1)
EOS%: 3.2 % (ref 0.0–7.0)
Eosinophils Absolute: 0.2 10*3/uL (ref 0.0–0.5)
HCT: 32.4 % — ABNORMAL LOW (ref 34.8–46.6)
HGB: 10.4 g/dL — ABNORMAL LOW (ref 11.6–15.9)
LYMPH%: 15 % (ref 14.0–49.7)
MCH: 33.9 pg (ref 25.1–34.0)
MCHC: 32.1 g/dL (ref 31.5–36.0)
MCV: 105.7 fL — ABNORMAL HIGH (ref 79.5–101.0)
MONO#: 0.7 10*3/uL (ref 0.1–0.9)
MONO%: 9.1 % (ref 0.0–14.0)
NEUT#: 5.5 10*3/uL (ref 1.5–6.5)
NEUT%: 72.3 % (ref 38.4–76.8)
Platelets: 282 10*3/uL (ref 145–400)
RBC: 3.07 10*6/uL — ABNORMAL LOW (ref 3.70–5.45)
RDW: 21.7 % — ABNORMAL HIGH (ref 11.2–14.5)
WBC: 7.6 10*3/uL (ref 3.9–10.3)
lymph#: 1.1 10*3/uL (ref 0.9–3.3)

## 2016-03-31 NOTE — Progress Notes (Signed)
VASCULAR LAB PRELIMINARY  PRELIMINARY  PRELIMINARY  PRELIMINARY  Left lower extremity venous duplex completed.    Preliminary report:  Left:  No evidence of DVT, superficial thrombosis, or Baker's cyst.  Loise Esguerra, RVT 03/31/2016, 1:33 PM

## 2016-03-31 NOTE — Telephone Encounter (Signed)
I did not open this encounter.

## 2016-03-31 NOTE — Telephone Encounter (Signed)
Orders received from [facility] to contact the patient to update with Doppler being "neg" for DVT to her LLE. Also to instruct the patient to resume her Eliquis today and update with Doppler results and CBC results are slightly improved. Contacted the patient's daughter Kenney Houseman and updated with the Doppler results being "Neg" and to have her mother resume her Eliquis today . Kenney Houseman states understanding , denies further questions at this time , will call with additional changes or concerns.

## 2016-03-31 NOTE — Progress Notes (Signed)
Patient presented to the office without an appointment after having labwork with complaints of pain posteriorly in the left leg.  She stated she began noticing pain when laying on her side in bed.  The pain is located behind her left knee.  Mild edema noted around the interior aspect of the ankle but no edema noted in the rest of the leg or on the right.  Doppler ordered to rule out DVT post-op.

## 2016-04-01 DIAGNOSIS — I6930 Unspecified sequelae of cerebral infarction: Secondary | ICD-10-CM | POA: Diagnosis not present

## 2016-04-01 DIAGNOSIS — I63512 Cerebral infarction due to unspecified occlusion or stenosis of left middle cerebral artery: Secondary | ICD-10-CM | POA: Diagnosis not present

## 2016-04-01 DIAGNOSIS — R404 Transient alteration of awareness: Secondary | ICD-10-CM | POA: Diagnosis not present

## 2016-04-01 DIAGNOSIS — G629 Polyneuropathy, unspecified: Secondary | ICD-10-CM | POA: Diagnosis not present

## 2016-04-02 DIAGNOSIS — I4891 Unspecified atrial fibrillation: Secondary | ICD-10-CM | POA: Diagnosis not present

## 2016-04-02 DIAGNOSIS — I10 Essential (primary) hypertension: Secondary | ICD-10-CM | POA: Diagnosis not present

## 2016-04-02 DIAGNOSIS — F329 Major depressive disorder, single episode, unspecified: Secondary | ICD-10-CM | POA: Diagnosis not present

## 2016-04-02 DIAGNOSIS — R2689 Other abnormalities of gait and mobility: Secondary | ICD-10-CM | POA: Diagnosis not present

## 2016-04-02 DIAGNOSIS — I69351 Hemiplegia and hemiparesis following cerebral infarction affecting right dominant side: Secondary | ICD-10-CM | POA: Diagnosis not present

## 2016-04-02 DIAGNOSIS — C50919 Malignant neoplasm of unspecified site of unspecified female breast: Secondary | ICD-10-CM | POA: Diagnosis not present

## 2016-04-07 DIAGNOSIS — F329 Major depressive disorder, single episode, unspecified: Secondary | ICD-10-CM | POA: Diagnosis not present

## 2016-04-07 DIAGNOSIS — C50919 Malignant neoplasm of unspecified site of unspecified female breast: Secondary | ICD-10-CM | POA: Diagnosis not present

## 2016-04-07 DIAGNOSIS — I4891 Unspecified atrial fibrillation: Secondary | ICD-10-CM | POA: Diagnosis not present

## 2016-04-07 DIAGNOSIS — F322 Major depressive disorder, single episode, severe without psychotic features: Secondary | ICD-10-CM | POA: Diagnosis not present

## 2016-04-07 DIAGNOSIS — R2689 Other abnormalities of gait and mobility: Secondary | ICD-10-CM | POA: Diagnosis not present

## 2016-04-07 DIAGNOSIS — I63512 Cerebral infarction due to unspecified occlusion or stenosis of left middle cerebral artery: Secondary | ICD-10-CM | POA: Diagnosis not present

## 2016-04-07 DIAGNOSIS — I10 Essential (primary) hypertension: Secondary | ICD-10-CM | POA: Diagnosis not present

## 2016-04-07 DIAGNOSIS — I69351 Hemiplegia and hemiparesis following cerebral infarction affecting right dominant side: Secondary | ICD-10-CM | POA: Diagnosis not present

## 2016-04-09 DIAGNOSIS — C50919 Malignant neoplasm of unspecified site of unspecified female breast: Secondary | ICD-10-CM | POA: Diagnosis not present

## 2016-04-09 DIAGNOSIS — I69351 Hemiplegia and hemiparesis following cerebral infarction affecting right dominant side: Secondary | ICD-10-CM | POA: Diagnosis not present

## 2016-04-09 DIAGNOSIS — I4891 Unspecified atrial fibrillation: Secondary | ICD-10-CM | POA: Diagnosis not present

## 2016-04-09 DIAGNOSIS — I10 Essential (primary) hypertension: Secondary | ICD-10-CM | POA: Diagnosis not present

## 2016-04-09 DIAGNOSIS — R2689 Other abnormalities of gait and mobility: Secondary | ICD-10-CM | POA: Diagnosis not present

## 2016-04-09 DIAGNOSIS — F329 Major depressive disorder, single episode, unspecified: Secondary | ICD-10-CM | POA: Diagnosis not present

## 2016-04-10 ENCOUNTER — Ambulatory Visit: Payer: Medicare Other | Attending: Gynecologic Oncology | Admitting: Gynecologic Oncology

## 2016-04-10 ENCOUNTER — Encounter: Payer: Self-pay | Admitting: Gynecologic Oncology

## 2016-04-10 ENCOUNTER — Other Ambulatory Visit: Payer: Self-pay

## 2016-04-10 VITALS — BP 103/72 | HR 105 | Temp 98.6°F | Resp 18 | Ht 70.0 in | Wt 179.3 lb

## 2016-04-10 DIAGNOSIS — I10 Essential (primary) hypertension: Secondary | ICD-10-CM | POA: Diagnosis not present

## 2016-04-10 DIAGNOSIS — Z79899 Other long term (current) drug therapy: Secondary | ICD-10-CM | POA: Diagnosis not present

## 2016-04-10 DIAGNOSIS — R2689 Other abnormalities of gait and mobility: Secondary | ICD-10-CM | POA: Diagnosis not present

## 2016-04-10 DIAGNOSIS — I480 Paroxysmal atrial fibrillation: Secondary | ICD-10-CM | POA: Insufficient documentation

## 2016-04-10 DIAGNOSIS — Z7901 Long term (current) use of anticoagulants: Secondary | ICD-10-CM | POA: Diagnosis not present

## 2016-04-10 DIAGNOSIS — C569 Malignant neoplasm of unspecified ovary: Secondary | ICD-10-CM | POA: Insufficient documentation

## 2016-04-10 DIAGNOSIS — Z853 Personal history of malignant neoplasm of breast: Secondary | ICD-10-CM | POA: Insufficient documentation

## 2016-04-10 DIAGNOSIS — I69351 Hemiplegia and hemiparesis following cerebral infarction affecting right dominant side: Secondary | ICD-10-CM | POA: Diagnosis not present

## 2016-04-10 DIAGNOSIS — E785 Hyperlipidemia, unspecified: Secondary | ICD-10-CM | POA: Diagnosis not present

## 2016-04-10 DIAGNOSIS — C50919 Malignant neoplasm of unspecified site of unspecified female breast: Secondary | ICD-10-CM | POA: Diagnosis not present

## 2016-04-10 DIAGNOSIS — Z7982 Long term (current) use of aspirin: Secondary | ICD-10-CM | POA: Diagnosis not present

## 2016-04-10 DIAGNOSIS — Z9889 Other specified postprocedural states: Secondary | ICD-10-CM | POA: Diagnosis not present

## 2016-04-10 DIAGNOSIS — Z8673 Personal history of transient ischemic attack (TIA), and cerebral infarction without residual deficits: Secondary | ICD-10-CM | POA: Insufficient documentation

## 2016-04-10 DIAGNOSIS — Z90710 Acquired absence of both cervix and uterus: Secondary | ICD-10-CM

## 2016-04-10 DIAGNOSIS — Z7189 Other specified counseling: Secondary | ICD-10-CM

## 2016-04-10 DIAGNOSIS — I4891 Unspecified atrial fibrillation: Secondary | ICD-10-CM | POA: Diagnosis not present

## 2016-04-10 DIAGNOSIS — F329 Major depressive disorder, single episode, unspecified: Secondary | ICD-10-CM | POA: Diagnosis not present

## 2016-04-10 DIAGNOSIS — Z9221 Personal history of antineoplastic chemotherapy: Secondary | ICD-10-CM

## 2016-04-10 DIAGNOSIS — F419 Anxiety disorder, unspecified: Secondary | ICD-10-CM | POA: Diagnosis not present

## 2016-04-10 NOTE — Progress Notes (Signed)
Ovarian Cancer Follow-up Note  Consult was initially requested by Dr. Venora Maples for the evaluation of Chassy Nored 65 y.o. female with presumed metastatic ovarian cancer  CC:  Chief Complaint  Patient presents with  . Ovarian Cancer    Follow up    Assessment/Plan:  [name]  is a 65 y.o.  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 S/p interval debulking (optimal) with robotic assisted total hysterectomy, BSO, omentectomy and right inguinal lymphadenectomy.  Postop depression: continue Remiron. Recommend 3 additional cycles of carboplatin and paclitaxel. Hx of CVA's and paroxysmal Afib on Eloquis. - continue ASA and Eloquis.  HPI: Brenda Cunningham is a 65 year old G6P6 who is seen in consultation at the request of ED physician, Dr Jola Schmidt for carcinomatosis. The patient reports having a stroke in August 2016 that resulted in expressive aphasia and right upper extremity weakness.  She has been treated for that with aspirin and L a close and is undergoing physical therapy and speech pathology.  The patient began developing vague left lower quadrant and hip discomfort and was seen in the emergency room on 11/29/2015. A CT scan of the abdomen and pelvis was ordered and revealed an indistinct uterus secondary to surrounding peritoneal masses. Internal coarse calcifications consistent with hyalinized fibroids. Bilateral ovaries enlarged and heterogeneous measuring up to 4 cm is on the right. Bilateral pelvic and inguinal adenopathy which appears malignant with a left inguinal lymph node measuring 1.8 mm and a right external iliac lymph node measuring 18 mm. The retroperitoneal adenopathy extensive pelvic brim. Innumerable masses clustered in the pelvis with interleukin peritoneal thickening and small ascites. The omentum has a started appearance.  The patient denies early satiety and reports mild and vague  abdominal bloating. She has a remote history of breast cancer in 2008 for which she was treated with lumpectomy, chemotherapy, and radiation. She is no family history significant for breast or ovarian cancer. She is otherwise treated for atrial fibrillation and has had a remote history of TIAs in 2010. In 2016 she underwent more significant cerebrovascular event which resulted in expressive aphasia and right upper extremity weakness. She also has hypertension and arthritis. Her prior surgical history significant for a left breast lumpectomy, appendectomy, tubal ligation. She's had 6 vaginal deliveries in the past.  She denies vaginal bleeding or history of abnormal Pap smears.  On 12/17/15 she underwent biopsy of the inguinal node which revealed high grade serous carcinoma. She commenced neoadjuvant chemotherapy on 12/27/15 with dose dense carboplatin and paclitaxel. Day 1 of cycle 3 was on 02/14/16.  After cycle 3 she began experiencing profound weakness in both lower extremities and requires maximal assistance at home now with mobility. She was evaluated by her neurologist, Dr Dellis Filbert on 03/03/16 who felt that she may have focal seizure vs syncopal episode. She is scheduled for an EEG on 03/17/16.  CT of the chest abdo and pelvis on 03/02/16 showed excellent response to therapy with resolution of ascites, regression of previously noted peritoneal implants and regression of lymphadenopathy in the abdomen and pelvis. There is continued soft tissue thickening along the perineal surfaces with the largest residual implant being 1.2 x 2.3 cm in the central low anatomic pelvis, and a 1.3 x 0.9 cm lesion in the low left paracolic gutter. The ovaries measure for 0.0 x 2.6 x 4.7 cm on the right and 1.8 x 2.3 x 2.37 m on the left.  The uterus is bulky and enlarged with fibroids.  On 02/28/16 CA 125 normalized to 33.6   Interval Hx: On 03/24/16 she underwent robotic total hysterectomy, BSO, right iliac lymphadenectomy,  ometectomy. Intraop findings included minimal residual peritoneal and omental disease, enlarged right external iliac lymph node. R0 resection with no macroscopic disease on completion. The pathology showed high grade serous carcinoma in the ovaries, (left ovarian primary), omentum, external iliac lymph node and peritoneum.  Postop she did well and was restarted on Eloquis. She developed edema in the left leg but DVT was ruled out on dopplers. She began having difficulties with "crying all the time" and insomnia and was started on Remiron.  Current Meds:  Outpatient Encounter Prescriptions as of 04/10/2016  Medication Sig  . acetaminophen (TYLENOL) 500 MG tablet Take 500-1,000 mg by mouth every 6 (six) hours as needed for moderate pain. Reported on 02/03/2016  . ALPRAZolam (XANAX) 0.25 MG tablet Take 0.25 mg by mouth 3 (three) times daily as needed for anxiety.  Marland Kitchen apixaban (ELIQUIS) 5 MG TABS tablet Take 1 tablet (5 mg total) by mouth 2 (two) times daily.  Marland Kitchen aspirin EC 81 MG tablet Take 81 mg by mouth daily.  Marland Kitchen atorvastatin (LIPITOR) 40 MG tablet Take 40 mg by mouth at bedtime.  Marland Kitchen loratadine (CLARITIN) 10 MG tablet Take 10 mg by mouth daily as needed (Recommended for chemo pain). Reported on 03/16/2016  . metoprolol tartrate (LOPRESSOR) 25 MG tablet 25 mg 2 (two) times daily.   . mirtazapine (REMERON) 15 MG tablet Take by mouth.  . Multiple Vitamins-Minerals (MULTIVITAMIN WITH MINERALS) tablet Take 1 tablet by mouth daily.  Marland Kitchen omega-3 acid ethyl esters (LOVAZA) 1 g capsule Take 1 g by mouth daily.  Marland Kitchen PARoxetine (PAXIL) 20 MG tablet TAKE 1 TABLET BY MOUTH DAILY  . traMADol (ULTRAM) 50 MG tablet Take 2 tablets (100 mg total) by mouth every 12 (twelve) hours as needed for moderate pain.  . verapamil (CALAN-SR) 240 MG CR tablet Take 1 tablet (240 mg total) by mouth daily.  . [DISCONTINUED] docusate sodium (COLACE) 100 MG capsule Take 1 capsule (100 mg total) by mouth 2 (two) times daily.  Marland Kitchen dexamethasone  (DECADRON) 4 MG tablet Take 5 tablets =20 mg with food 12 hrs before Taxol Chemotherapy (Patient not taking: Reported on 04/10/2016)  . ferrous fumarate (HEMOCYTE - 106 MG FE) 325 (106 Fe) MG TABS tablet Take 1 tablet daly on an empty stomach with OJ or Vitamin C (Patient not taking: Reported on 04/10/2016)  . lidocaine-prilocaine (EMLA) cream Apply to Porta-Cath 1-2 hours prior to access as directed. (Patient not taking: Reported on 04/10/2016)  . LORazepam (ATIVAN) 0.5 MG tablet Place 1 tablet under the tongue or swallow every 6 hours as needed for nausea. Will make you drowsy. (Patient not taking: Reported on 04/10/2016)  . ondansetron (ZOFRAN) 8 MG tablet Take 1 tablet (8 mg total) by mouth every 8 (eight) hours as needed for nausea or vomiting (Will not make drowsy.). (Patient not taking: Reported on 03/16/2016)  . prochlorperazine (COMPAZINE) 10 MG tablet Take 1 tablet (10 mg total) by mouth every 6 (six) hours as needed for nausea or vomiting. (Patient not taking: Reported on 03/16/2016)  . [DISCONTINUED] acetaminophen (TYLENOL) 500 MG tablet Take by mouth.  . [DISCONTINUED] neomycin-bacitracin-polymyxin (NEOSPORIN) ointment Place 1 application into the nose daily as needed. apply to each nostril once daily PRN if nose bleeds after blowing  . [DISCONTINUED] traMADol (ULTRAM) 50 MG tablet Take 1 tablet (  50 mg total) by mouth every 6 (six) hours as needed.   No facility-administered encounter medications on file as of 04/10/2016.    Allergy:  Allergies  Allergen Reactions  . Codeine Hives and Itching  . Latex     Skin breaks out per patient.    Social Hx:   Social History   Social History  . Marital Status: Single    Spouse Name: N/A  . Number of Children: N/A  . Years of Education: N/A   Occupational History  . Not on file.   Social History Main Topics  . Smoking status: Never Smoker   . Smokeless tobacco: Not on file  . Alcohol Use: No  . Drug Use: No  . Sexual Activity: Not on file    Other Topics Concern  . Not on file   Social History Narrative   Lives with her female friend, Mina Marble, walks for exercise.  Originally from [place].    Past Surgical Hx:  Past Surgical History  Procedure Laterality Date  . Breast lumpectomy  2008    left  . Appendectomy    . Tubal ligation    . Colonoscopy  2004  . Robotic assisted total hysterectomy with bilateral salpingo oopherectomy Bilateral 03/24/2016    Procedure: XI ROBOTIC ASSISTED TOTA LAPAROSCOPIC  HYSTERECTOMY WITH BILATERAL SALPINGO OOPHORECTOMY;  Surgeon: Everitt Amber, MD;  Location: [facility];  Service: Gynecology;  Laterality: Bilateral;  . Omentectomy N/A 03/24/2016    Procedure: OMENTECTOMY;  Surgeon: Everitt Amber, MD;  Location: [facility];  Service: Gynecology;  Laterality: N/A;  . Debulking N/A 03/24/2016    Procedure: RADICAL TUMOR DEBULKING;  Surgeon: Everitt Amber, MD;  Location: [facility];  Service: Gynecology;  Laterality: N/A;    Past Medical Hx:  Past Medical History  Diagnosis Date  . Hypertension   . Arthritis   . A-fib ([place])   . Hyperlipidemia   . Wears glasses   . Anxiety   . Complication of anesthesia     DIFFICULTY WAKING UP FROM ANESTHESIA  . Stroke ([place]) 07/29/2015  . Breast cancer ([place]) 2008    s/p lumpectomy, chemo and radiation therapy    Past Gynecological History:  SVD x 6  No LMP recorded. Patient is postmenopausal.  Family Hx:  Family History  Problem Relation Age of Onset  . Family history unknown: Yes    Review of Systems:  Constitutional  Feels well,    ENT Normal appearing ears and nares bilaterally Skin/Breast  No rash, sores, jaundice, itching, dryness Cardiovascular  No chest pain, shortness of breath, or edema  Pulmonary  No cough or wheeze.  Gastro Intestinal  No nausea, vomitting, or diarrhoea. No bright red blood per rectum, no abdominal pain, change in bowel movement, or constipation.  Genito Urinary  No frequency, urgency, dysuria,  Musculo Skeletal  Left chest  wall discomfort Neurologic  No weakness, numbness, change in gait,  Psychology  No depression, anxiety, insomnia.   Vitals:  Blood pressure 103/72, pulse 105, temperature 98.6 F (37 C), temperature source Oral, resp. rate 18, height 5\' 10"  (1.778 m), weight 179 lb 4.8 oz (81.33 kg), SpO2 100 %.  Physical Exam: WD in NAD Neck  Supple NROM, without any enlargements.  Lymph Node Survey No cervical supraclavicular adenopathy. Left inguinal lymph node is no longer palpable Cardiovascular  Pulse normal rate, regularity and rhythm. S1 and S2 normal.  Lungs  Clear to auscultation bilateraly, without wheezes/crackles/rhonchi. Good air movement.  Skin  No rash/lesions/breakdown  Psychiatry  Alert and oriented to person, place, and time  Abdomen  Normoactive bowel sounds, abdomen soft, non-tender and overweight without evidence of hernia. Slightly distended. No masses. Well healed abdominal incisions Back No CVA tenderness Genito Urinary  Vulva/vagina: Normal external female genitalia.  No lesions. No discharge or bleeding.  Bladder/urethra:  No lesions or masses, well supported bladder  Vagina: vaginal cuff shows old blood but no infectious discharge  Cervix: surgically absent .  Uterus: surgically absent.  Adnexa: as above . Rectal  Good tone, no masses no cul de sac nodularity.  Extremities  No bilateral cyanosis, clubbing or edema.   20 minutes of direct face to face counseling time was spent with the patient. This included discussion about prognosis, therapy recommendations and postoperative side effects and are beyond the scope of routine postoperative care.   Donaciano Eva, MD  04/10/2016, 1:13 PM

## 2016-04-10 NOTE — Patient Instructions (Signed)
You should have resumed your Eliquis and aspirin as previously prescribed before surgery.  Plan to follow up with Dr. Marko Plume as scheduled.  Please call for any questions or concerns.

## 2016-04-12 ENCOUNTER — Other Ambulatory Visit: Payer: Self-pay | Admitting: Oncology

## 2016-04-13 ENCOUNTER — Encounter: Payer: Self-pay | Admitting: Oncology

## 2016-04-13 ENCOUNTER — Ambulatory Visit (HOSPITAL_BASED_OUTPATIENT_CLINIC_OR_DEPARTMENT_OTHER): Payer: Medicare Other | Admitting: Oncology

## 2016-04-13 ENCOUNTER — Telehealth: Payer: Self-pay

## 2016-04-13 ENCOUNTER — Telehealth: Payer: Self-pay | Admitting: Oncology

## 2016-04-13 ENCOUNTER — Other Ambulatory Visit (HOSPITAL_BASED_OUTPATIENT_CLINIC_OR_DEPARTMENT_OTHER): Payer: Medicare Other

## 2016-04-13 ENCOUNTER — Ambulatory Visit: Payer: Medicare Other

## 2016-04-13 VITALS — BP 119/74 | HR 75 | Temp 98.4°F | Resp 18 | Ht 70.0 in | Wt 179.7 lb

## 2016-04-13 DIAGNOSIS — Z95828 Presence of other vascular implants and grafts: Secondary | ICD-10-CM

## 2016-04-13 DIAGNOSIS — G622 Polyneuropathy due to other toxic agents: Secondary | ICD-10-CM | POA: Diagnosis not present

## 2016-04-13 DIAGNOSIS — R4701 Aphasia: Secondary | ICD-10-CM

## 2016-04-13 DIAGNOSIS — Z8673 Personal history of transient ischemic attack (TIA), and cerebral infarction without residual deficits: Secondary | ICD-10-CM

## 2016-04-13 DIAGNOSIS — C569 Malignant neoplasm of unspecified ovary: Secondary | ICD-10-CM

## 2016-04-13 DIAGNOSIS — I10 Essential (primary) hypertension: Secondary | ICD-10-CM | POA: Diagnosis not present

## 2016-04-13 DIAGNOSIS — C561 Malignant neoplasm of right ovary: Secondary | ICD-10-CM

## 2016-04-13 DIAGNOSIS — Z7901 Long term (current) use of anticoagulants: Secondary | ICD-10-CM

## 2016-04-13 DIAGNOSIS — C801 Malignant (primary) neoplasm, unspecified: Secondary | ICD-10-CM | POA: Diagnosis present

## 2016-04-13 DIAGNOSIS — T451X5A Adverse effect of antineoplastic and immunosuppressive drugs, initial encounter: Secondary | ICD-10-CM

## 2016-04-13 DIAGNOSIS — D6481 Anemia due to antineoplastic chemotherapy: Secondary | ICD-10-CM

## 2016-04-13 DIAGNOSIS — C562 Malignant neoplasm of left ovary: Secondary | ICD-10-CM

## 2016-04-13 DIAGNOSIS — Z853 Personal history of malignant neoplasm of breast: Secondary | ICD-10-CM | POA: Diagnosis not present

## 2016-04-13 DIAGNOSIS — I4891 Unspecified atrial fibrillation: Secondary | ICD-10-CM | POA: Diagnosis not present

## 2016-04-13 DIAGNOSIS — D649 Anemia, unspecified: Secondary | ICD-10-CM | POA: Diagnosis not present

## 2016-04-13 DIAGNOSIS — G620 Drug-induced polyneuropathy: Secondary | ICD-10-CM

## 2016-04-13 DIAGNOSIS — C563 Malignant neoplasm of bilateral ovaries: Secondary | ICD-10-CM

## 2016-04-13 LAB — CBC WITH DIFFERENTIAL/PLATELET
BASO%: 0.5 % (ref 0.0–2.0)
Basophils Absolute: 0 10*3/uL (ref 0.0–0.1)
EOS%: 3.3 % (ref 0.0–7.0)
Eosinophils Absolute: 0.2 10*3/uL (ref 0.0–0.5)
HCT: 33.7 % — ABNORMAL LOW (ref 34.8–46.6)
HGB: 11 g/dL — ABNORMAL LOW (ref 11.6–15.9)
LYMPH%: 25.7 % (ref 14.0–49.7)
MCH: 34.1 pg — ABNORMAL HIGH (ref 25.1–34.0)
MCHC: 32.7 g/dL (ref 31.5–36.0)
MCV: 104.2 fL — ABNORMAL HIGH (ref 79.5–101.0)
MONO#: 0.6 10*3/uL (ref 0.1–0.9)
MONO%: 9 % (ref 0.0–14.0)
NEUT#: 3.8 10*3/uL (ref 1.5–6.5)
NEUT%: 61.5 % (ref 38.4–76.8)
Platelets: 293 10*3/uL (ref 145–400)
RBC: 3.24 10*6/uL — ABNORMAL LOW (ref 3.70–5.45)
RDW: 18.7 % — ABNORMAL HIGH (ref 11.2–14.5)
WBC: 6.1 10*3/uL (ref 3.9–10.3)
lymph#: 1.6 10*3/uL (ref 0.9–3.3)

## 2016-04-13 LAB — COMPREHENSIVE METABOLIC PANEL
ALT: 18 U/L (ref 0–55)
AST: 17 U/L (ref 5–34)
Albumin: 3.2 g/dL — ABNORMAL LOW (ref 3.5–5.0)
Alkaline Phosphatase: 83 U/L (ref 40–150)
Anion Gap: 7 mEq/L (ref 3–11)
BUN: 13.2 mg/dL (ref 7.0–26.0)
CO2: 26 mEq/L (ref 22–29)
Calcium: 9.6 mg/dL (ref 8.4–10.4)
Chloride: 107 mEq/L (ref 98–109)
Creatinine: 0.7 mg/dL (ref 0.6–1.1)
EGFR: >90 mL/min/{1.73_m2} (ref >90)
Glucose: 90 mg/dl (ref 70–140)
Potassium: 3.6 mEq/L (ref 3.5–5.1)
Sodium: 140 mEq/L (ref 136–145)
Total Bilirubin: 0.7 mg/dL (ref 0.20–1.20)
Total Protein: 6.5 g/dL (ref 6.4–8.3)

## 2016-04-13 LAB — IRON AND TIBC
%SAT: 23 % (ref 21–57)
Iron: 72 ug/dL (ref 41–142)
TIBC: 306 ug/dL (ref 236–444)
UIBC: 234 ug/dL (ref 120–384)

## 2016-04-13 MED ORDER — DOCUSATE SODIUM 100 MG PO CAPS: 100.0000 mg | ORAL_CAPSULE | Freq: Two times a day (BID) | ORAL | Status: DC | PRN

## 2016-04-13 MED ORDER — DEXAMETHASONE 4 MG PO TABS: ORAL_TABLET | ORAL | Status: DC

## 2016-04-13 MED ORDER — SODIUM CHLORIDE 0.9 % IJ SOLN
10.0000 mL | INTRAMUSCULAR | Status: DC | PRN
  Administered 2016-04-13: 10 mL via INTRAVENOUS
  Filled 2016-04-13: qty 10

## 2016-04-13 MED ORDER — HEPARIN SOD (PORK) LOCK FLUSH 100 UNIT/ML IV SOLN
500.0000 [IU] | Freq: Once | INTRAVENOUS | Status: AC | PRN
  Administered 2016-04-13: 500 [IU] via INTRAVENOUS
  Filled 2016-04-13: qty 5

## 2016-04-13 NOTE — Telephone Encounter (Signed)
S/w Kenney Houseman re Dr Edwyna Shell attached message

## 2016-04-13 NOTE — Telephone Encounter (Signed)
appt made and avs printed °

## 2016-04-13 NOTE — Progress Notes (Signed)
OFFICE PROGRESS NOTE   Apr 13, 2016   Physicians: Emma Rossi, Tameka Howell (PCP), Corey Lamar ([phone] neurology [facility] [place] [facility]), Dr John Powers (cardiology [facility], [phone], new patient apt 01-15-16) Patrick Hung (Alapati P. Thomas, PCP [place], [place] [phone]) ( (oncologist Dr Kim in [place], [place]; breast radiation given at [facility] in [place]). 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 Plan is to give an additional 3 cycles of the same chemotherapy.   Surgery was robotic TAH BSO omentectoy and right inguinal lymphadenectomy, which was R0 debulking. Intraoperatively there appeared to be minimal residual peritoneal and omental disease, with enlarged right iliac node. Pathology [id number] high grade serous carcinoma primary left ovary where residual focal involvement with extensive fibrosis, and focal  involvement of omentum also with extensive fibrosis, external iliac node and peritoneum. Perioperative course was uncomplicated, with patient DC on post op day 1.  She resumed ASA and Eliquis following surgery.  Since surgery she has had some constipation, improved wth colace and miralax. She has had no fever, no significant pain, no bleeding. She is eating adequately. Home health PT, OT and speech therapy are assisting. No new or different neurologic deficits, tho she has been more tearful and emotional at times, disliked drowsiness from Remeron which she has tried once. She has significant weakness RUE/ RLE since CVA, uses 4 prong cane as unable to manage walker. Patient occasionally drops things with left hand, not clearly new. She seems to report numbness sole of left foot during  conversation now, tho family not aware of this complaint prior and communication difficult. Denies SOB, new or different pain, LE swelling, dysuria. No problems with PAC, which was not used for surgery. Remainder of 10 point Review of Systems negative/ unchanged.    CA 125 12-24-15 by [employer] ECLIA 608, and on 01-01-16 741 PAC in, flushed 03-16-16 Flu vaccine 10-10-15 No genetics testing known with prior breast cancer; with diagnosis now known ovarian, genetics testing would be appropriate.   ONCOLOGIC HISTORY Patient presented to ED in [facility] 11-28-15 with LLQ pain and left hip pain, symptomatic for at least a year per family but worse. CT AP 11-28-15 had bilateral ovarian masses up to 4 cm on right, with peritoneal carcinomatosis in pelvis, omentum and perihepatic, with small ascites, bilateral pelvic and inguinal adenopathy, with retroperitoneal adenopathy extending to pelvic brim. She was seen in consultation by Dr Rossi on 12-06-15, findings consistent with stage IV gyn cancer likely ovarian and not presently resectable. 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 Recommendation was to begin treatment with carboplatin taxol, with reevaluation after 3 cycles for possible interval debulking surgery. Day 1 cycle 1 dose dense carbo taxol was given on 12-27-15, then missed day 8 cycle 1 due to lack of IV access. ANC was 0.1 on day 14 (01-09-16) with granix given x3 and day 15 delayed another week. She received day 15 cycle 1 on 01-17-16 after PICC placed that day. She completed cycle 3 on 02-28-16. CT CAP 03-02-16 showed resolution of ascites and significant improvement in peritoneal disease and adenopathy. She had interval   robotic TAH BSO omentectoy and right inguinal lymphadenectomy on 03-24-16, which was R0 debulking. Intraoperatively there  appeared to be minimal residual peritoneal and omental disease, with enlarged right iliac node. Pathology [phone] high grade serous carcinoma primary left ovary where residual focal involvement with extensive fibrosis, and focal  involvement of omentum also with extensive fibrosis, external iliac node and peritoneum.  She has history of left breast cancer 2005 treated in [place] with lumpectomy, chemotherapy and radiation, none of those records presently available.   Objective:  Vital signs in last 24 hours:  BP 119/74 mmHg  Pulse 75  Temp(Src) 98.4 F (36.9 C) (Oral)  Resp 18  Ht 5' 10" (1.778 m)  Wt 179 lb 11.2 oz (81.511 kg)  BMI 25.78 kg/m2  SpO2 100% Weight down from 189 at visit 03-16-16.  Alert, cooperative, appears comfortable in WC, respirations not labored RA. Expressive aphasia as previously. Alopecia  HEENT:PERRL, sclerae not icteric. Oral mucosa moist without lesions, posterior pharynx clear.  Neck supple. No JVD.  Lymphatics:no cervical,supraclavicur adenopathy Resp: clear to auscultation bilaterally  Cardio: regular rate and rhythm. No gallop. GI: soft, nontender, not distended, no mass or organomegaly. Some bowel sounds. Surgical incisions not remarkable, surgical glue still in place. Musculoskeletal/ Extremities: without pitting edema, cords, tenderness Neuro: minimal motion RUE, able to lift RLE easily on and off WC foot rest. Good strenght and motion left extremities. No clear neuropathy left hand, questionable neuropathy left foot (sole). PSYCH not tearful or obviously anxious now. Skin without rash, ecchymosis, petechiae Portacath-without erythema or tenderness  Lab Results:  Results for orders placed or performed in visit on 04/13/16  CBC with Differential  Result Value Ref Range   WBC 6.1 3.9 - 10.3 10e3/uL   NEUT# 3.8 1.5 - 6.5 10e3/uL   HGB 11.0 (L) 11.6 - 15.9 g/dL   HCT 33.7 (L) 34.8 - 46.6 %   Platelets 293 145 - 400 10e3/uL   MCV 104.2 (H) 79.5 -  101.0 fL   MCH 34.1 (H) 25.1 - 34.0 pg   MCHC 32.7 31.5 - 36.0 g/dL   RBC 3.24 (L) 3.70 - 5.45 10e6/uL   RDW 18.7 (H) 11.2 - 14.5 %   lymph# 1.6 0.9 - 3.3 10e3/uL   MONO# 0.6 0.1 - 0.9 10e3/uL   Eosinophils Absolute 0.2 0.0 - 0.5 10e3/uL   Basophils Absolute 0.0 0.0 - 0.1 10e3/uL   NEUT% 61.5 38.4 - 76.8 %   LYMPH% 25.7 14.0 - 49.7 %   MONO% 9.0 0.0 - 14.0 %   EOS% 3.3 0.0 - 7.0 %   BASO% 0.5 0.0 - 2.0 %  Comprehensive metabolic panel  Result Value Ref Range   Sodium 140 136 - 145 mEq/L   Potassium 3.6 3.5 - 5.1 mEq/L   Chloride 107 98 - 109 mEq/L   CO2 26 22 - 29 mEq/L   Glucose 90 70 - 140 mg/dl   BUN 13.2 7.0 - 26.0 mg/dL   Creatinine 0.7 0.6 - 1.1 mg/dL   Total Bilirubin 0.70 0.20 - 1.20 mg/dL   Alkaline Phosphatase 83 40 - 150 U/L   AST 17 5 - 34 U/L   ALT 18 0 - 55 U/L   Total Protein 6.5 6.4 - 8.3 g/dL   Albumin 3.2 (L) 3.5 - 5.0 g/dL   Calcium 9.6 8.4 - 10.4 mg/dL   Anion Gap 7 3 - 11 mEq/L   EGFR >90 >90 ml/min/1.73 m2  Iron and TIBC  Result Value Ref Range  Iron 72 41 - 142 ug/dL   TIBC 306 236 - 444 ug/dL   UIBC 234 120 - 384 ug/dL   %SAT 23 21 - 57 %     Studies/Results:  ERKINS, Mayrin Collected: 03/24/2016 Client: [facility] Accession: [id number] Received: 03/24/2016 Emma Rossi, MD REPORT OF SURGICAL PATHOLOGY FINAL DIAGNOSIS Diagnosis 1. Omentum, resection for tumor - FOCAL ADENOCARCINOMA ASSOCIATED WITH EXTENSIVE FIBROSIS, INFLAMMATION AND HEMOSIDERIN DEPOSITION. 2. Uterus +/- tubes/ovaries, neoplastic, cervix - CERVIX: SLIGHT CERVICITIS AND SQUAMOUS METAPLASIA. - ENDOMETRIUM: ATROPHIC, NO HYPERPLASIA OR MALIGNANCY. - MYOMETRIUM: LEIOMYOMATA WITH DEGENERATIVE CHANGES. NO MALIGNANCY. - UTERINE SEROSA: FOCAL ADENOCARCINOMA ASSOCIATED WITH ADHESIONS. - RIGHT OVARY: BENIGN CALCIFIED NODULE AND BENIGN SEROUS CYST. NO MALIGNANCY IDENTIFIED. - RIGHT FALLOPIAN TUBE: UNREMARKABLE. NO MALIGNANCY IDENTIFIED. - LEFT OVARY: FOCAL ADENOCARCINOMA  ASSOCIATED WITH EXTENSIVE FIBROSIS. - LEFT FALLOPIAN TUBE: HYDROSALPINX. NO MALIGNANCY IDENTIFIED. 3. Lymph node, biopsy, right external iliac - METASTATIC ADENOCARCINOMA, 4. Soft tissue, biopsy, left para colic gutter - FOCAL ADENOCARCINOMA ASSOCIATED WITH FIBROSIS. Microscopic Comment 2. OVARY Specimen(s): Uterus with bilateral ovaries, omentum, right external iliac lymph node and left paracolic gutter. Procedure: (including lymph node sampling) Hysterectomy with bilateral salpingo-oophorectomy, omentectomy, lymph node biopsy and paracolic gutter biopsy. Primary tumor site (including laterality): Left ovary. Ovarian surface involvement: Yes Ovarian capsule intact without fragmentation: N/A Maximum tumor size (cm): 2 cm, 2 cm Histologic type: Serous carcinoma. Grade: 3 Peritoneal implants: (specify invasive or non-invasive): Left paracolic gutter positive for carcinoma Pelvic extension (list additional structures on separate lines and if involved): Omentum, right paracolic gutter and uterine serosa. Lymph nodes: number    Medications: I have reviewed the patient's current medications. Discussed Remeron, prescribed as lowest dose however patient and family concerned that it made her too drowsy.  DISCUSSION Discussed good response found at surgery to first cycles of chemo. Patient understands recommendation to continue chemo anticipated for 3 more cycles. Will follow neuropathy symptoms as best we can as we try additional taxol. Family requests start of chemo next week, to let her enjoy Mother's Day.  Discussed episodes of crying, not inappropriate given all she has been thru with the cancer diagnosis and stroke, may improve as she recovers more from the recent surgery. Certainly they could speak with PCP about Remeron also.   Assessment/Plan:  1.IVB high grade serous carcinoma of left ovary, treated to this point with 3 cycles of neoadjuvant dose dense carbo taxol, interval R0  debulking, and for additional chemo beginning 04-24-16. Chemo neutropenia with ANC 0.1 on day 14 cycle 1. Continue  GCSF.days 2,3,9,16.  Chemo peripheral neuropathy is difficult to assess with right CVA and communication difficulty. Will be very aware of this ongoing. Needs genetics referral. 2. 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 Has resumed outpatient PT/ OT/ speech therapy by HH. Followed by neurology in [place]. History of TIAs 2010  3.PAC in by IR 01-29-16. Very difficult peripheral IV access. 4.atrial fibrillation diagnosed ~ 2011, now on Eliquis + ASA. Now followed by Dr John Powers [facility], next visit ~ 6 months. No problems with chemo thrombocytopenia thus far, but need to be aware of anticoagulation if so. 5.Left breast cancer 2005 diagnosed and treated in [place] with lumpectomy and presumably axillary node evaluation, combination chemotherapy (drugs not known) via peripheral veins, local radiation. Up to date on [facility]  6.hx HTN 7.Constipation apparently from pain medication: increase laxatives until resolved, then prn. Colon polyps at colonoscopy by Dr Hung 2015.   Follow up not specified on the report in EMR 8.post appendectomy, BTL 9.family history of cardiac disease with MIs in sisters ages 17 and 65, and in brother 10.flu vaccine done 10-2015 67.information on Advance Directives given 12..weight loss initially with improvement in cancer, now gaining. 13.anemia: gradually progressive on chemo, on ferrous fumarate now. She has not been transfused.   All questions answered. Chemo and granix orders placed, day 1 cycle 4 04-24-16. Time spent 25 min including >50% counseling and coordination of care. Route PCP, cc Drs Jacquenette Shone, Powers   Gordy Levan, MD   04/13/2016, 12:11 PM

## 2016-04-13 NOTE — Patient Instructions (Signed)

## 2016-04-13 NOTE — Telephone Encounter (Signed)
-----   Message from Gordy Levan, MD sent at 04/13/2016 10:26 AM EDT ----- Scripts  Colace 100 mg bid prn  (or OTC if needed) Decadron enough for 3 weeks 2 refills  thanks

## 2016-04-14 ENCOUNTER — Other Ambulatory Visit: Payer: Self-pay | Admitting: Oncology

## 2016-04-14 DIAGNOSIS — F329 Major depressive disorder, single episode, unspecified: Secondary | ICD-10-CM | POA: Diagnosis not present

## 2016-04-14 DIAGNOSIS — C50919 Malignant neoplasm of unspecified site of unspecified female breast: Secondary | ICD-10-CM | POA: Diagnosis not present

## 2016-04-14 DIAGNOSIS — R2689 Other abnormalities of gait and mobility: Secondary | ICD-10-CM | POA: Diagnosis not present

## 2016-04-14 DIAGNOSIS — I10 Essential (primary) hypertension: Secondary | ICD-10-CM | POA: Diagnosis not present

## 2016-04-14 DIAGNOSIS — I4891 Unspecified atrial fibrillation: Secondary | ICD-10-CM | POA: Diagnosis not present

## 2016-04-14 DIAGNOSIS — I69351 Hemiplegia and hemiparesis following cerebral infarction affecting right dominant side: Secondary | ICD-10-CM | POA: Diagnosis not present

## 2016-04-15 DIAGNOSIS — I4891 Unspecified atrial fibrillation: Secondary | ICD-10-CM | POA: Diagnosis not present

## 2016-04-15 DIAGNOSIS — R2689 Other abnormalities of gait and mobility: Secondary | ICD-10-CM | POA: Diagnosis not present

## 2016-04-15 DIAGNOSIS — I69351 Hemiplegia and hemiparesis following cerebral infarction affecting right dominant side: Secondary | ICD-10-CM | POA: Diagnosis not present

## 2016-04-15 DIAGNOSIS — F329 Major depressive disorder, single episode, unspecified: Secondary | ICD-10-CM | POA: Diagnosis not present

## 2016-04-15 DIAGNOSIS — C50919 Malignant neoplasm of unspecified site of unspecified female breast: Secondary | ICD-10-CM | POA: Diagnosis not present

## 2016-04-15 DIAGNOSIS — I10 Essential (primary) hypertension: Secondary | ICD-10-CM | POA: Diagnosis not present

## 2016-04-16 DIAGNOSIS — I4891 Unspecified atrial fibrillation: Secondary | ICD-10-CM | POA: Diagnosis not present

## 2016-04-16 DIAGNOSIS — R2689 Other abnormalities of gait and mobility: Secondary | ICD-10-CM | POA: Diagnosis not present

## 2016-04-16 DIAGNOSIS — F329 Major depressive disorder, single episode, unspecified: Secondary | ICD-10-CM | POA: Diagnosis not present

## 2016-04-16 DIAGNOSIS — C50919 Malignant neoplasm of unspecified site of unspecified female breast: Secondary | ICD-10-CM | POA: Diagnosis not present

## 2016-04-16 DIAGNOSIS — I69351 Hemiplegia and hemiparesis following cerebral infarction affecting right dominant side: Secondary | ICD-10-CM | POA: Diagnosis not present

## 2016-04-16 DIAGNOSIS — I10 Essential (primary) hypertension: Secondary | ICD-10-CM | POA: Diagnosis not present

## 2016-04-17 ENCOUNTER — Emergency Department (HOSPITAL_COMMUNITY)
Admission: EM | Admit: 2016-04-17 | Discharge: 2016-04-18 | Disposition: A | Discharge status: Home / Self Care | Payer: Medicare Other | Attending: Emergency Medicine | Admitting: Emergency Medicine

## 2016-04-17 ENCOUNTER — Encounter (HOSPITAL_COMMUNITY): Payer: Self-pay | Admitting: *Deleted

## 2016-04-17 ENCOUNTER — Emergency Department (HOSPITAL_COMMUNITY): Payer: Medicare Other

## 2016-04-17 DIAGNOSIS — I10 Essential (primary) hypertension: Secondary | ICD-10-CM | POA: Insufficient documentation

## 2016-04-17 DIAGNOSIS — Z853 Personal history of malignant neoplasm of breast: Secondary | ICD-10-CM | POA: Diagnosis not present

## 2016-04-17 DIAGNOSIS — Z90710 Acquired absence of both cervix and uterus: Secondary | ICD-10-CM | POA: Diagnosis not present

## 2016-04-17 DIAGNOSIS — Z9049 Acquired absence of other specified parts of digestive tract: Secondary | ICD-10-CM | POA: Diagnosis not present

## 2016-04-17 DIAGNOSIS — Z9851 Tubal ligation status: Secondary | ICD-10-CM | POA: Insufficient documentation

## 2016-04-17 DIAGNOSIS — Z973 Presence of spectacles and contact lenses: Secondary | ICD-10-CM | POA: Diagnosis not present

## 2016-04-17 DIAGNOSIS — Z8543 Personal history of malignant neoplasm of ovary: Secondary | ICD-10-CM | POA: Insufficient documentation

## 2016-04-17 DIAGNOSIS — K5900 Constipation, unspecified: Secondary | ICD-10-CM | POA: Diagnosis not present

## 2016-04-17 DIAGNOSIS — I4891 Unspecified atrial fibrillation: Secondary | ICD-10-CM | POA: Insufficient documentation

## 2016-04-17 DIAGNOSIS — M1990 Unspecified osteoarthritis, unspecified site: Secondary | ICD-10-CM | POA: Diagnosis not present

## 2016-04-17 DIAGNOSIS — R1032 Left lower quadrant pain: Secondary | ICD-10-CM

## 2016-04-17 DIAGNOSIS — Z7982 Long term (current) use of aspirin: Secondary | ICD-10-CM | POA: Diagnosis not present

## 2016-04-17 DIAGNOSIS — Z91040 Latex allergy status: Secondary | ICD-10-CM | POA: Insufficient documentation

## 2016-04-17 DIAGNOSIS — Z79899 Other long term (current) drug therapy: Secondary | ICD-10-CM | POA: Insufficient documentation

## 2016-04-17 DIAGNOSIS — F419 Anxiety disorder, unspecified: Secondary | ICD-10-CM | POA: Diagnosis not present

## 2016-04-17 DIAGNOSIS — Z8673 Personal history of transient ischemic attack (TIA), and cerebral infarction without residual deficits: Secondary | ICD-10-CM | POA: Diagnosis not present

## 2016-04-17 DIAGNOSIS — Z7901 Long term (current) use of anticoagulants: Secondary | ICD-10-CM | POA: Insufficient documentation

## 2016-04-17 DIAGNOSIS — E785 Hyperlipidemia, unspecified: Secondary | ICD-10-CM | POA: Insufficient documentation

## 2016-04-17 LAB — COMPREHENSIVE METABOLIC PANEL
ALT: 20 U/L (ref 14–54)
AST: 24 U/L (ref 15–41)
Albumin: 3.5 g/dL (ref 3.5–5.0)
Alkaline Phosphatase: 84 U/L (ref 38–126)
Anion gap: 10 (ref 5–15)
BUN: 15 mg/dL (ref 6–20)
CO2: 27 mmol/L (ref 22–32)
Calcium: 9.7 mg/dL (ref 8.9–10.3)
Chloride: 106 mmol/L (ref 101–111)
Creatinine, Ser: 0.7 mg/dL (ref 0.44–1.00)
GFR calc Af Amer: >60 mL/min (ref >60)
GFR calc non Af Amer: >60 mL/min (ref >60)
Glucose, Bld: 101 mg/dL — ABNORMAL HIGH (ref 65–99)
Potassium: 3.5 mmol/L (ref 3.5–5.1)
Sodium: 143 mmol/L (ref 135–145)
Total Bilirubin: 0.7 mg/dL (ref 0.3–1.2)
Total Protein: 6.4 g/dL — ABNORMAL LOW (ref 6.5–8.1)

## 2016-04-17 LAB — URINALYSIS, ROUTINE W REFLEX MICROSCOPIC
Bilirubin Urine: NEGATIVE
Glucose, UA: NEGATIVE mg/dL
Ketones, ur: NEGATIVE mg/dL
Leukocytes, UA: NEGATIVE
Nitrite: NEGATIVE
Protein, ur: NEGATIVE mg/dL
Specific Gravity, Urine: 1.006 (ref 1.005–1.030)
pH: 5.5 (ref 5.0–8.0)

## 2016-04-17 LAB — URINE MICROSCOPIC-ADD ON

## 2016-04-17 LAB — CBC WITH DIFFERENTIAL/PLATELET
Basophils Absolute: 0 10*3/uL (ref 0.0–0.1)
Basophils Relative: 0 %
Eosinophils Absolute: 0.3 10*3/uL (ref 0.0–0.7)
Eosinophils Relative: 5 %
HCT: 32.3 % — ABNORMAL LOW (ref 36.0–46.0)
Hemoglobin: 10.9 g/dL — ABNORMAL LOW (ref 12.0–15.0)
Lymphocytes Relative: 40 %
Lymphs Abs: 2.8 10*3/uL (ref 0.7–4.0)
MCH: 33.7 pg (ref 26.0–34.0)
MCHC: 33.7 g/dL (ref 30.0–36.0)
MCV: 100 fL (ref 78.0–100.0)
Monocytes Absolute: 0.9 10*3/uL (ref 0.1–1.0)
Monocytes Relative: 14 %
Neutro Abs: 2.7 10*3/uL (ref 1.7–7.7)
Neutrophils Relative %: 41 %
Platelets: 291 10*3/uL (ref 150–400)
RBC: 3.23 MIL/uL — ABNORMAL LOW (ref 3.87–5.11)
RDW: 16.2 % — ABNORMAL HIGH (ref 11.5–15.5)
WBC: 6.7 10*3/uL (ref 4.0–10.5)

## 2016-04-17 MED ORDER — MINERAL OIL RE ENEM: 1.0000 | ENEMA | Freq: Once | RECTAL | Status: DC

## 2016-04-17 MED ORDER — IOPAMIDOL (ISOVUE-300) INJECTION 61%
100.0000 mL | Freq: Once | INTRAVENOUS | Status: AC | PRN
  Administered 2016-04-17: 100 mL via INTRAVENOUS

## 2016-04-17 NOTE — ED Provider Notes (Signed)
CSN: [id number]     Arrival date & time 04/17/16  1921 History   First MD Initiated Contact with Patient 04/17/16 2006     Chief Complaint  Patient presents with  . Abdominal Pain     (Consider location/radiation/quality/duration/timing/severity/associated sxs/prior Treatment) HPI Comments: Patient with a history of HTN, anticoagulated atrial fibrillation (Eliquis), HLD, CVA with residual aphasia, ovarian cancer s/p hysterectomy 3 weeks ago presents with 2 days of left sided abdominal pain. She states "I think its gas", but BM and flatus does not change the character of the pain. No fever, N, V, dysuria.   Patient is a 65 y.o. female presenting with abdominal pain. The history is provided by the patient and a relative. No language interpreter was used.  Abdominal Pain Pain location:  LLQ and LUQ Pain quality: aching and sharp   Pain radiates to:  Does not radiate Pain severity:  Moderate Duration:  2 days Timing:  Constant Associated symptoms: no chest pain, no chills, no dysuria, no fever, no nausea, no shortness of breath and no vomiting     Past Medical History  Diagnosis Date  . Hypertension   . Arthritis   . A-fib ([place])   . Hyperlipidemia   . Wears glasses   . Anxiety   . Complication of anesthesia     DIFFICULTY WAKING UP FROM ANESTHESIA  . Stroke ([place]) 07/29/2015  . Breast cancer ([place]) 2008    s/p lumpectomy, chemo and radiation therapy   Past Surgical History  Procedure Laterality Date  . Breast lumpectomy  2008    left  . Appendectomy    . Tubal ligation    . Colonoscopy  2004  . Robotic assisted total hysterectomy with bilateral salpingo oopherectomy Bilateral 03/24/2016    Procedure: XI ROBOTIC ASSISTED TOTA LAPAROSCOPIC  HYSTERECTOMY WITH BILATERAL SALPINGO OOPHORECTOMY;  Surgeon: Everitt Amber, MD;  Location: [facility];  Service: Gynecology;  Laterality: Bilateral;  . Omentectomy N/A 03/24/2016    Procedure: OMENTECTOMY;  Surgeon: Everitt Amber, MD;  Location: [facility];   Service: Gynecology;  Laterality: N/A;  . Debulking N/A 03/24/2016    Procedure: RADICAL TUMOR DEBULKING;  Surgeon: Everitt Amber, MD;  Location: [facility];  Service: Gynecology;  Laterality: N/A;   Family History  Problem Relation Age of Onset  . Family history unknown: Yes   Social History  Substance Use Topics  . Smoking status: Never Smoker   . Smokeless tobacco: None  . Alcohol Use: No   OB History    No data available     Review of Systems  Constitutional: Negative for fever and chills.  Respiratory: Negative.  Negative for shortness of breath.   Cardiovascular: Negative.  Negative for chest pain.  Gastrointestinal: Positive for abdominal pain. Negative for nausea, vomiting and abdominal distention.  Genitourinary: Negative.  Negative for dysuria.  Musculoskeletal: Negative.  Negative for myalgias and back pain.  Neurological: Negative.       Allergies  Codeine and Latex  Home Medications   Prior to Admission medications   Medication Sig Start Date End Date Taking? Authorizing Provider  acetaminophen (TYLENOL) 500 MG tablet Take 500-1,000 mg by mouth every 6 (six) hours as needed for moderate pain. Reported on 02/03/2016   Yes Historical Provider, MD  ALPRAZolam Duanne Moron) 0.25 MG tablet Take 0.25 mg by mouth 3 (three) times daily as needed for anxiety.   Yes Historical Provider, MD  apixaban (ELIQUIS) 5 MG TABS tablet Take 1 tablet (5 mg total) by mouth 2 (  two) times daily. 09/11/15  Yes Camelia Eng Tysinger, PA-C  aspirin EC 81 MG tablet Take 81 mg by mouth daily.   Yes Historical Provider, MD  atorvastatin (LIPITOR) 40 MG tablet Take 40 mg by mouth at bedtime.   Yes Historical Provider, MD  docusate sodium (COLACE) 100 MG capsule Take 1 capsule (100 mg total) by mouth 2 (two) times daily as needed for mild constipation. 04/13/16  Yes Lennis Marion Downer, MD  loratadine (CLARITIN) 10 MG tablet Take 10 mg by mouth daily as needed (Recommended for chemo pain). Reported on 03/16/2016   Yes  Historical Provider, MD  metoprolol tartrate (LOPRESSOR) 25 MG tablet Take 25 mg by mouth 2 (two) times daily.  03/10/16  Yes Historical Provider, MD  mirtazapine (REMERON) 15 MG tablet Take 15 mg by mouth at bedtime. Reported on 04/13/2016 04/07/16 05/07/16 Yes Historical Provider, MD  Multiple Vitamins-Minerals (MULTIVITAMIN WITH MINERALS) tablet Take 1 tablet by mouth daily.   Yes Historical Provider, MD  omega-3 acid ethyl esters (LOVAZA) 1 g capsule Take 1 g by mouth daily.   Yes Historical Provider, MD  PARoxetine (PAXIL) 20 MG tablet TAKE 1 TABLET BY MOUTH DAILY Patient taking differently: TAKE 20 MG BY MOUTH DAILY 06/03/15  Yes Camelia Eng Tysinger, PA-C  traMADol (ULTRAM) 50 MG tablet Take 2 tablets (100 mg total) by mouth every 12 (twelve) hours as needed for moderate pain. 03/25/16  Yes Everitt Amber, MD  verapamil (CALAN-SR) 240 MG CR tablet Take 1 tablet (240 mg total) by mouth daily. 07/25/15  Yes Camelia Eng Tysinger, PA-C  dexamethasone (DECADRON) 4 MG tablet Take 5 tablets =20 mg with food 12 hrs before Taxol Chemotherapy Patient not taking: Reported on 04/17/2016 04/13/16   Lennis Marion Downer, MD  ferrous fumarate (HEMOCYTE - 106 MG FE) 325 (106 Fe) MG TABS tablet Take 1 tablet daly on an empty stomach with OJ or Vitamin C Patient not taking: Reported on 04/10/2016 03/17/16   Lennis Marion Downer, MD  lidocaine-prilocaine (EMLA) cream Apply to Porta-Cath 1-2 hours prior to access as directed. Patient not taking: Reported on 04/17/2016 12/31/15   Gordy Levan, MD  LORazepam (ATIVAN) 0.5 MG tablet Place 1 tablet under the tongue or swallow every 6 hours as needed for nausea. Will make you drowsy. Patient not taking: Reported on 04/10/2016 12/24/15   Lennis P Marko Plume, MD  ondansetron (ZOFRAN) 8 MG tablet Take 1 tablet (8 mg total) by mouth every 8 (eight) hours as needed for nausea or vomiting (Will not make drowsy.). Patient not taking: Reported on 03/16/2016 12/24/15   Gordy Levan, MD  prochlorperazine  (COMPAZINE) 10 MG tablet Take 1 tablet (10 mg total) by mouth every 6 (six) hours as needed for nausea or vomiting. Patient not taking: Reported on 04/13/2016 12/26/15   Lennis P Marko Plume, MD   BP 126/87 mmHg  Pulse 98  Resp 16  Ht 5\' 5"  (1.651 m)  Wt 81.194 kg  BMI 29.79 kg/m2  SpO2 100% Physical Exam  Constitutional: She is oriented to person, place, and time. She appears well-developed and well-nourished.  HENT:  Head: Normocephalic.  Neck: Normal range of motion. Neck supple.  Cardiovascular: Normal rate and regular rhythm.   Pulmonary/Chest: Effort normal and breath sounds normal.  Abdominal: Soft. Bowel sounds are normal. There is no rebound and no guarding.    Healing laparoscopic incisions abdominal wall. Tenderness to abdomen is minimal.   Musculoskeletal: Normal range of motion.  Neurological: She is alert and  oriented to person, place, and time.  Skin: Skin is warm and dry. No rash noted.  Psychiatric: She has a normal mood and affect.    ED Course  Procedures (including critical care time) Labs Review Labs Reviewed  CBC WITH DIFFERENTIAL/PLATELET  COMPREHENSIVE METABOLIC PANEL  URINALYSIS, ROUTINE W REFLEX MICROSCOPIC (NOT AT [facility])   Results for orders placed or performed during the hospital encounter of 04/17/16  CBC with Differential  Result Value Ref Range   WBC 6.7 4.0 - 10.5 K/uL   RBC 3.23 (L) 3.87 - 5.11 MIL/uL   Hemoglobin 10.9 (L) 12.0 - 15.0 g/dL   HCT 32.3 (L) 36.0 - 46.0 %   MCV 100.0 78.0 - 100.0 fL   MCH 33.7 26.0 - 34.0 pg   MCHC 33.7 30.0 - 36.0 g/dL   RDW 16.2 (H) 11.5 - 15.5 %   Platelets 291 150 - 400 K/uL   Neutrophils Relative % 41 %   Neutro Abs 2.7 1.7 - 7.7 K/uL   Lymphocytes Relative 40 %   Lymphs Abs 2.8 0.7 - 4.0 K/uL   Monocytes Relative 14 %   Monocytes Absolute 0.9 0.1 - 1.0 K/uL   Eosinophils Relative 5 %   Eosinophils Absolute 0.3 0.0 - 0.7 K/uL   Basophils Relative 0 %   Basophils Absolute 0.0 0.0 - 0.1 K/uL    Comprehensive metabolic panel  Result Value Ref Range   Sodium 143 135 - 145 mmol/L   Potassium 3.5 3.5 - 5.1 mmol/L   Chloride 106 101 - 111 mmol/L   CO2 27 22 - 32 mmol/L   Glucose, Bld 101 (H) 65 - 99 mg/dL   BUN 15 6 - 20 mg/dL   Creatinine, Ser 0.70 0.44 - 1.00 mg/dL   Calcium 9.7 8.9 - 10.3 mg/dL   Total Protein 6.4 (L) 6.5 - 8.1 g/dL   Albumin 3.5 3.5 - 5.0 g/dL   AST 24 15 - 41 U/L   ALT 20 14 - 54 U/L   Alkaline Phosphatase 84 38 - 126 U/L   Total Bilirubin 0.7 0.3 - 1.2 mg/dL   GFR calc non Af Amer >60 >60 mL/min   GFR calc Af Amer >60 >60 mL/min   Anion gap 10 5 - 15  Urinalysis, Routine w reflex microscopic  Result Value Ref Range   Color, Urine YELLOW YELLOW   APPearance CLEAR CLEAR   Specific Gravity, Urine 1.006 1.005 - 1.030   pH 5.5 5.0 - 8.0   Glucose, UA NEGATIVE NEGATIVE mg/dL   Hgb urine dipstick TRACE (A) NEGATIVE   Bilirubin Urine NEGATIVE NEGATIVE   Ketones, ur NEGATIVE NEGATIVE mg/dL   Protein, ur NEGATIVE NEGATIVE mg/dL   Nitrite NEGATIVE NEGATIVE   Leukocytes, UA NEGATIVE NEGATIVE  Urine microscopic-add on  Result Value Ref Range   Squamous Epithelial / LPF 0-5 (A) NONE SEEN   WBC, UA 0-5 0 - 5 WBC/hpf   RBC / HPF 0-5 0 - 5 RBC/hpf   Bacteria, UA RARE (A) NONE SEEN   Ct Abdomen Pelvis W Contrast  04/17/2016  CLINICAL DATA:  Lower abdominal pain for 2 days, constipation, hysterectomy 3 weeks ago 4 ovarian cancer, last chemotherapy 1 month ago EXAM: CT ABDOMEN AND PELVIS WITH CONTRAST TECHNIQUE: Multidetector CT imaging of the abdomen and pelvis was performed using the standard protocol following bolus administration of intravenous contrast. CONTRAST:  134mL ISOVUE-300 IOPAMIDOL (ISOVUE-300) INJECTION 61% COMPARISON:  03/02/2016 FINDINGS: Lung bases are unremarkable. Enhanced liver shows stable low-density  lesion in caudate lobe of the liver measures 1.7 x 1 cm. No calcified gallstones are noted within contracted gallbladder. No CBD dilatation.  Enhanced pancreas is unremarkable. The spleen is unremarkable. No adrenal gland mass. Enhanced kidneys are symmetrical in size. Lobulated renal contour bilaterally again noted. Multifocal cortical scarring again noted. No hydronephrosis or hydroureter. The urinary bladder is unremarkable. Delayed renal images shows bilateral renal symmetrical excretion. The urinary bladder is unremarkable on delayed images. Bilateral visualized proximal ureter is unremarkable on delayed images. There is no gastric outlet obstruction. No thickened or dilated small bowel loops. No pericecal inflammation. The appendix is not identified. Moderate stool noted in right colon and transverse colon. Moderate colonic stool noted in descending colon. Moderate stool noted in distal sigmoid colon. No evidence of colitis or diverticulitis. No small bowel obstruction. There is no evidence of aortic aneurysm.  No adenopathy is noted. There is stable minimal residual peritoneal thickening in lower at anterior pelvis axial image 63. Peritoneal thickening Mild focal in left paracolic gutter axial image 63 is stable. No evidence of new peritoneal implants. No significant ascites. The uterus and ovaries are surgically absent. Small nonspecific bilateral inguinal lymph nodes are stable. No destructive bony lesions are noted within pelvis. Sagittal images of the spine shows mild degenerative changes lumbar spine. There is mild anterior spurring at L4-L5 level. IMPRESSION: 1. There is no evidence of acute inflammatory process within abdomen or pelvis. 2. Stable low-density lesion within caudate lobe of the liver. No new hepatic lesions are noted. 3. Again noted lobulated renal contour and multifocal renal cortical scarring. No hydronephrosis or hydroureter. 4. No significant mesenteric adenopathy. No retroperitoneal adenopathy. Stable minimal residual peritoneal thickening within pelvis. No new pelvic implants or pelvic ascites. 5. Status post  hysterectomy.  Unremarkable urinary bladder. 6. Moderate stool within colon as described above. No evidence of colitis. Electronically Signed   By: Lahoma Crocker M.D.   On: 04/17/2016 22:46    Imaging Review No results found. I have personally reviewed and evaluated these images and lab results as part of my medical decision-making.   EKG Interpretation None      MDM   Final diagnoses:  None    1. Abdominal pain 2. S/p hysterectomy x 3 weeks   Patient in NAD with c/o abdominal pain x 4 days. No fever, N, V. Feels constipated, "gassy", but took laxative with BM today without change to pain. S/P hysterectomy secondary to ovarian CA 3 weeks ago. Labs unremarkable/reassuring. CT abd showing constipation without other acute findings. Patient is examined by Dr. Zenia Resides who feels she is stable for discharge home. Will provide fleets enema.     Charlann Lange, PA-C 04/17/16 2332  Lacretia Leigh, MD 04/19/16 [phone]

## 2016-04-17 NOTE — ED Notes (Signed)
RN,Stephanie draw labs from pt. [place].

## 2016-04-17 NOTE — ED Notes (Signed)
Pt states left lower abdominal pain for about 2 days, no n/v/d or fevers.  C/o constipation (went 4 days without BM and had one today). Had hysterectomy 3 weeks ago for ovarian cancer- last chemo was about 1 month ago.

## 2016-04-17 NOTE — ED Provider Notes (Signed)
Medical screening examination/treatment/procedure(s) were conducted as a shared visit with non-physician practitioner(s) and myself.  I personally evaluated the patient during the encounter.   EKG Interpretation None     Patient here with 2 days of lower abdominal pain without associated fever, nausea vomiting. Has had some constipation abdominal CT confirms this. Abdominal exam is nonacute. She is stable for discharge to home  Lacretia Leigh, MD 04/17/16 2310

## 2016-04-17 NOTE — ED Notes (Signed)
Patient transported to CT 

## 2016-04-17 NOTE — Discharge Instructions (Signed)

## 2016-04-18 MED ORDER — HEPARIN SOD (PORK) LOCK FLUSH 100 UNIT/ML IV SOLN
500.0000 [IU] | Freq: Once | INTRAVENOUS | Status: AC
Start: 2016-04-18 — End: 2016-04-18
  Administered 2016-04-18: 500 [IU]
  Filled 2016-04-18: qty 5

## 2016-04-18 NOTE — ED Notes (Signed)
Patient d/c'd self care.  F/U and medications discussed.  Patient verbalized understanding. 

## 2016-04-20 DIAGNOSIS — F329 Major depressive disorder, single episode, unspecified: Secondary | ICD-10-CM | POA: Diagnosis not present

## 2016-04-20 DIAGNOSIS — I69351 Hemiplegia and hemiparesis following cerebral infarction affecting right dominant side: Secondary | ICD-10-CM | POA: Diagnosis not present

## 2016-04-20 DIAGNOSIS — R2689 Other abnormalities of gait and mobility: Secondary | ICD-10-CM | POA: Diagnosis not present

## 2016-04-20 DIAGNOSIS — C50919 Malignant neoplasm of unspecified site of unspecified female breast: Secondary | ICD-10-CM | POA: Diagnosis not present

## 2016-04-20 DIAGNOSIS — I10 Essential (primary) hypertension: Secondary | ICD-10-CM | POA: Diagnosis not present

## 2016-04-20 DIAGNOSIS — I4891 Unspecified atrial fibrillation: Secondary | ICD-10-CM | POA: Diagnosis not present

## 2016-04-21 ENCOUNTER — Telehealth: Payer: Self-pay

## 2016-04-21 ENCOUNTER — Telehealth: Payer: Self-pay | Admitting: Oncology

## 2016-04-21 ENCOUNTER — Other Ambulatory Visit: Payer: Self-pay | Admitting: Oncology

## 2016-04-21 DIAGNOSIS — R2689 Other abnormalities of gait and mobility: Secondary | ICD-10-CM | POA: Diagnosis not present

## 2016-04-21 DIAGNOSIS — F329 Major depressive disorder, single episode, unspecified: Secondary | ICD-10-CM | POA: Diagnosis not present

## 2016-04-21 DIAGNOSIS — C50919 Malignant neoplasm of unspecified site of unspecified female breast: Secondary | ICD-10-CM | POA: Diagnosis not present

## 2016-04-21 DIAGNOSIS — I69351 Hemiplegia and hemiparesis following cerebral infarction affecting right dominant side: Secondary | ICD-10-CM | POA: Diagnosis not present

## 2016-04-21 DIAGNOSIS — I10 Essential (primary) hypertension: Secondary | ICD-10-CM | POA: Diagnosis not present

## 2016-04-21 DIAGNOSIS — I4891 Unspecified atrial fibrillation: Secondary | ICD-10-CM | POA: Diagnosis not present

## 2016-04-21 NOTE — Telephone Encounter (Signed)
Medical Oncology  Phone call from call service RN at [phone] requesting MD call back to daughter Lavella Lemons re ongoing abdominal pain.  EMR reviewed: seen in ED on 04-17-16 for left sided abdominal pain. CT AP showed large amount of stool in descending and sigmoid colon, no other findings noted, afebrile, urine ok, stable by exam.  Per daughter, patient continues to have abdominal pain. She has stopped pain medication. Yesterday she took miralax (# doses not known, possibly 1 or 2), 1 bottle mag citrate, mineral oil enema, colace bid. No fever, no vomiting. Had a couple of very small stools yesterday.   Told daughter to give miralax now and another bottle mag citrate now. Can use miralax tid if needed. OK to continue colace. Prune juice probably will not add much to other laxatives now. Asked daughter to call back to RN early afternoon today to let us know situation. Told daughter that patient needs to clear out very large amount of stool by the imaging.   From my note 04-13-16, she has some HH involvement now; if RN, could have her check for impaction if no results by call back to our office later today.   Daughter appreciated call  L.Marko Plume, MD

## 2016-04-21 NOTE — Telephone Encounter (Signed)
Spoke with Brenda Cunningham at [facility] and requested Nursing visit with SSE until clear to morrow.  Nurse with call daughter Brenda Cunningham in the am to set up. Spoke with Brenda Cunningham and told her to use a Dulcolax suppository this evening as well as noted below by Dr. Marko Plume. [facility] nurse to be in touch tomorrow am to set up evaluation. Brenda Cunningham stated that her mother said she moved bowels a small amount after she left.  Suggested she continue with plan as noted below. Brenda Cunningham verbalized understanding.

## 2016-04-21 NOTE — Telephone Encounter (Signed)
Please set up [facility] nurse today or tomorrow,  whatever possible Have patient also try dulcolax suppository today  Thank you

## 2016-04-21 NOTE — Telephone Encounter (Signed)
-----   Message from Gordy Levan, MD sent at 04/21/2016  8:42 AM EDT ----- Daughter to call back to RN early afternoon to let us know if any results from laxatives - see my phone note this AM. If [facility] nurse, that person might be able to check for impaction if needed by this afternoon - has [facility] but I don't know about nurse thanks

## 2016-04-21 NOTE — Telephone Encounter (Signed)
Daughter Kenney Houseman called stating that her mother has taken in 1 bottle of mag citrate and 1 capful of Mira lax about 1.5 hrs ago. No BM yet.  She still has to take in 2 more capfuls of Mira ax by tonight. Told daughter that this would be reviewed with Dr. Marko Plume.

## 2016-04-21 NOTE — Telephone Encounter (Signed)
Spoke with [facility] earlier and they have PT and OTin the home.([phone])  The cannot do digital/manual check for impaction.  They can do SSE until clear.   They would need to do a skilled nursing evaluation and then SSE.  I can order this today for tomorrow or see if any results tomorrow and then order.  Do not know if a nurse could come out on same day as order. What do you suggest?

## 2016-04-22 DIAGNOSIS — C50919 Malignant neoplasm of unspecified site of unspecified female breast: Secondary | ICD-10-CM | POA: Diagnosis not present

## 2016-04-22 DIAGNOSIS — F329 Major depressive disorder, single episode, unspecified: Secondary | ICD-10-CM | POA: Diagnosis not present

## 2016-04-22 DIAGNOSIS — R2689 Other abnormalities of gait and mobility: Secondary | ICD-10-CM | POA: Diagnosis not present

## 2016-04-22 DIAGNOSIS — I69351 Hemiplegia and hemiparesis following cerebral infarction affecting right dominant side: Secondary | ICD-10-CM | POA: Diagnosis not present

## 2016-04-22 DIAGNOSIS — I10 Essential (primary) hypertension: Secondary | ICD-10-CM | POA: Diagnosis not present

## 2016-04-22 DIAGNOSIS — I4891 Unspecified atrial fibrillation: Secondary | ICD-10-CM | POA: Diagnosis not present

## 2016-04-23 DIAGNOSIS — C50919 Malignant neoplasm of unspecified site of unspecified female breast: Secondary | ICD-10-CM | POA: Diagnosis not present

## 2016-04-23 DIAGNOSIS — F329 Major depressive disorder, single episode, unspecified: Secondary | ICD-10-CM | POA: Diagnosis not present

## 2016-04-23 DIAGNOSIS — I4891 Unspecified atrial fibrillation: Secondary | ICD-10-CM | POA: Diagnosis not present

## 2016-04-23 DIAGNOSIS — R2689 Other abnormalities of gait and mobility: Secondary | ICD-10-CM | POA: Diagnosis not present

## 2016-04-23 DIAGNOSIS — I69351 Hemiplegia and hemiparesis following cerebral infarction affecting right dominant side: Secondary | ICD-10-CM | POA: Diagnosis not present

## 2016-04-23 DIAGNOSIS — I10 Essential (primary) hypertension: Secondary | ICD-10-CM | POA: Diagnosis not present

## 2016-04-24 ENCOUNTER — Other Ambulatory Visit (HOSPITAL_BASED_OUTPATIENT_CLINIC_OR_DEPARTMENT_OTHER): Payer: Medicare Other

## 2016-04-24 ENCOUNTER — Ambulatory Visit: Payer: Medicare Other

## 2016-04-24 ENCOUNTER — Ambulatory Visit (HOSPITAL_BASED_OUTPATIENT_CLINIC_OR_DEPARTMENT_OTHER): Payer: Medicare Other

## 2016-04-24 ENCOUNTER — Other Ambulatory Visit: Payer: Self-pay | Admitting: Oncology

## 2016-04-24 VITALS — BP 122/84 | HR 78 | Temp 98.3°F | Resp 17

## 2016-04-24 DIAGNOSIS — C561 Malignant neoplasm of right ovary: Secondary | ICD-10-CM

## 2016-04-24 DIAGNOSIS — C563 Malignant neoplasm of bilateral ovaries: Secondary | ICD-10-CM

## 2016-04-24 DIAGNOSIS — Z5111 Encounter for antineoplastic chemotherapy: Secondary | ICD-10-CM | POA: Diagnosis present

## 2016-04-24 DIAGNOSIS — C562 Malignant neoplasm of left ovary: Secondary | ICD-10-CM

## 2016-04-24 DIAGNOSIS — Z95828 Presence of other vascular implants and grafts: Secondary | ICD-10-CM

## 2016-04-24 LAB — CBC WITH DIFFERENTIAL/PLATELET
BASO%: 0.2 % (ref 0.0–2.0)
Basophils Absolute: 0 10*3/uL (ref 0.0–0.1)
EOS%: 0 % (ref 0.0–7.0)
Eosinophils Absolute: 0 10*3/uL (ref 0.0–0.5)
HCT: 36.9 % (ref 34.8–46.6)
HGB: 12 g/dL (ref 11.6–15.9)
LYMPH%: 14.8 % (ref 14.0–49.7)
MCH: 33.6 pg (ref 25.1–34.0)
MCHC: 32.4 g/dL (ref 31.5–36.0)
MCV: 103.7 fL — ABNORMAL HIGH (ref 79.5–101.0)
MONO#: 0 10*3/uL — ABNORMAL LOW (ref 0.1–0.9)
MONO%: 0.6 % (ref 0.0–14.0)
NEUT#: 6.4 10*3/uL (ref 1.5–6.5)
NEUT%: 84.4 % — ABNORMAL HIGH (ref 38.4–76.8)
Platelets: 231 10*3/uL (ref 145–400)
RBC: 3.56 10*6/uL — ABNORMAL LOW (ref 3.70–5.45)
RDW: 16.2 % — ABNORMAL HIGH (ref 11.2–14.5)
WBC: 7.6 10*3/uL (ref 3.9–10.3)
lymph#: 1.1 10*3/uL (ref 0.9–3.3)

## 2016-04-24 LAB — COMPREHENSIVE METABOLIC PANEL
ALT: 30 U/L (ref 0–55)
AST: 23 U/L (ref 5–34)
Albumin: 3.5 g/dL (ref 3.5–5.0)
Alkaline Phosphatase: 92 U/L (ref 40–150)
Anion Gap: 8 mEq/L (ref 3–11)
BUN: 12.9 mg/dL (ref 7.0–26.0)
CO2: 25 mEq/L (ref 22–29)
Calcium: 10 mg/dL (ref 8.4–10.4)
Chloride: 105 mEq/L (ref 98–109)
Creatinine: 0.8 mg/dL (ref 0.6–1.1)
EGFR: 90 mL/min/{1.73_m2} — ABNORMAL LOW (ref >90)
Glucose: 170 mg/dl — ABNORMAL HIGH (ref 70–140)
Potassium: 3.8 mEq/L (ref 3.5–5.1)
Sodium: 138 mEq/L (ref 136–145)
Total Bilirubin: 0.63 mg/dL (ref 0.20–1.20)
Total Protein: 7.2 g/dL (ref 6.4–8.3)

## 2016-04-24 MED ORDER — FAMOTIDINE 20 MG PO TABS
ORAL_TABLET | ORAL | Status: AC
  Filled 2016-04-24: qty 1

## 2016-04-24 MED ORDER — SODIUM CHLORIDE 0.9 % IV SOLN
20.0000 mg | Freq: Once | INTRAVENOUS | Status: AC
  Administered 2016-04-24: 20 mg via INTRAVENOUS
  Filled 2016-04-24: qty 2

## 2016-04-24 MED ORDER — HEPARIN SOD (PORK) LOCK FLUSH 100 UNIT/ML IV SOLN
500.0000 [IU] | Freq: Once | INTRAVENOUS | Status: AC | PRN
  Administered 2016-04-24: 500 [IU]
  Filled 2016-04-24: qty 5

## 2016-04-24 MED ORDER — SODIUM CHLORIDE 0.9 % IJ SOLN
10.0000 mL | INTRAMUSCULAR | Status: DC | PRN
  Administered 2016-04-24: 10 mL
  Filled 2016-04-24: qty 10

## 2016-04-24 MED ORDER — SODIUM CHLORIDE 0.9 % IV SOLN
Freq: Once | INTRAVENOUS | Status: AC
  Administered 2016-04-24: 11:00:00 via INTRAVENOUS

## 2016-04-24 MED ORDER — SODIUM CHLORIDE 0.9 % IJ SOLN
10.0000 mL | INTRAMUSCULAR | Status: DC | PRN
  Administered 2016-04-24: 10 mL via INTRAVENOUS
  Filled 2016-04-24: qty 10

## 2016-04-24 MED ORDER — PACLITAXEL CHEMO INJECTION 300 MG/50ML
85.0000 mg/m2 | Freq: Once | INTRAVENOUS | Status: AC
  Administered 2016-04-24: 162 mg via INTRAVENOUS
  Filled 2016-04-24: qty 27

## 2016-04-24 MED ORDER — FAMOTIDINE IN NACL 20-0.9 MG/50ML-% IV SOLN
20.0000 mg | Freq: Once | INTRAVENOUS | Status: AC
Start: 2016-04-24 — End: 2016-04-24
  Administered 2016-04-24: 20 mg via INTRAVENOUS

## 2016-04-24 MED ORDER — DIPHENHYDRAMINE HCL 50 MG/ML IJ SOLN
INTRAMUSCULAR | Status: AC
  Filled 2016-04-24: qty 1

## 2016-04-24 MED ORDER — DIPHENHYDRAMINE HCL 50 MG/ML IJ SOLN
25.0000 mg | Freq: Once | INTRAMUSCULAR | Status: AC
  Administered 2016-04-24: 25 mg via INTRAVENOUS

## 2016-04-24 MED ORDER — PACLITAXEL CHEMO INJECTION 300 MG/50ML: 80.0000 mg/m2 | Freq: Once | INTRAVENOUS | Status: DC

## 2016-04-24 MED ORDER — SODIUM CHLORIDE 0.9 % IV SOLN
509.0000 mg | Freq: Once | INTRAVENOUS | Status: AC
  Administered 2016-04-24: 510 mg via INTRAVENOUS
  Filled 2016-04-24: qty 51

## 2016-04-24 MED ORDER — PALONOSETRON HCL INJECTION 0.25 MG/5ML
0.2500 mg | Freq: Once | INTRAVENOUS | Status: AC
  Administered 2016-04-24: 0.25 mg via INTRAVENOUS

## 2016-04-24 MED ORDER — FAMOTIDINE IN NACL 20-0.9 MG/50ML-% IV SOLN
INTRAVENOUS | Status: AC
  Filled 2016-04-24: qty 50

## 2016-04-24 MED ORDER — PALONOSETRON HCL INJECTION 0.25 MG/5ML
INTRAVENOUS | Status: AC
  Filled 2016-04-24: qty 5

## 2016-04-24 NOTE — Patient Instructions (Signed)
[facility] Discharge Instructions for Patients Receiving Chemotherapy .Today you received the following chemotherapy agents: Taxol, Carboplatin  To help prevent nausea and vomiting after your treatment, we encourage you to take your nausea medication: Zofran every 8hrs as needed starting 04/27/16 and compazine every 6 hrs as needed starting today.    If you develop nausea and vomiting that is not controlled by your nausea medication, call the clinic.   BELOW ARE SYMPTOMS THAT SHOULD BE REPORTED IMMEDIATELY:  *FEVER GREATER THAN 100.5 F  *CHILLS WITH OR WITHOUT FEVER  NAUSEA AND VOMITING THAT IS NOT CONTROLLED WITH YOUR NAUSEA MEDICATION  *UNUSUAL SHORTNESS OF BREATH  *UNUSUAL BRUISING OR BLEEDING  TENDERNESS IN MOUTH AND THROAT WITH OR WITHOUT PRESENCE OF ULCERS  *URINARY PROBLEMS  *BOWEL PROBLEMS  UNUSUAL RASH Items with * indicate a potential emergency and should be followed up as soon as possible.  Feel free to call the clinic you have any questions or concerns. The clinic phone number is [phone].  Please show the [facility] [place] [facility] at check-in to the Emergency Department and triage nurse.

## 2016-04-24 NOTE — Patient Instructions (Signed)

## 2016-04-25 ENCOUNTER — Ambulatory Visit (HOSPITAL_BASED_OUTPATIENT_CLINIC_OR_DEPARTMENT_OTHER): Payer: Medicare Other

## 2016-04-25 VITALS — BP 125/76 | HR 96 | Temp 99.1°F | Resp 18

## 2016-04-25 DIAGNOSIS — C562 Malignant neoplasm of left ovary: Secondary | ICD-10-CM

## 2016-04-25 DIAGNOSIS — C561 Malignant neoplasm of right ovary: Secondary | ICD-10-CM

## 2016-04-25 MED ORDER — TBO-FILGRASTIM 480 MCG/0.8ML ~~LOC~~ SOSY
480.0000 ug | PREFILLED_SYRINGE | Freq: Once | SUBCUTANEOUS | Status: AC
  Administered 2016-04-25: 480 ug via SUBCUTANEOUS

## 2016-04-27 ENCOUNTER — Ambulatory Visit (HOSPITAL_COMMUNITY): Payer: Medicare Other | Admitting: Psychiatry

## 2016-04-27 ENCOUNTER — Ambulatory Visit (HOSPITAL_BASED_OUTPATIENT_CLINIC_OR_DEPARTMENT_OTHER): Payer: Medicare Other

## 2016-04-27 VITALS — BP 143/89 | HR 78 | Temp 98.2°F | Resp 20

## 2016-04-27 DIAGNOSIS — Z95828 Presence of other vascular implants and grafts: Secondary | ICD-10-CM

## 2016-04-27 DIAGNOSIS — C50919 Malignant neoplasm of unspecified site of unspecified female breast: Secondary | ICD-10-CM | POA: Diagnosis not present

## 2016-04-27 DIAGNOSIS — C561 Malignant neoplasm of right ovary: Secondary | ICD-10-CM

## 2016-04-27 DIAGNOSIS — C562 Malignant neoplasm of left ovary: Secondary | ICD-10-CM | POA: Diagnosis not present

## 2016-04-27 DIAGNOSIS — Z5189 Encounter for other specified aftercare: Secondary | ICD-10-CM

## 2016-04-27 DIAGNOSIS — F329 Major depressive disorder, single episode, unspecified: Secondary | ICD-10-CM | POA: Diagnosis not present

## 2016-04-27 DIAGNOSIS — R2689 Other abnormalities of gait and mobility: Secondary | ICD-10-CM | POA: Diagnosis not present

## 2016-04-27 DIAGNOSIS — I69351 Hemiplegia and hemiparesis following cerebral infarction affecting right dominant side: Secondary | ICD-10-CM | POA: Diagnosis not present

## 2016-04-27 DIAGNOSIS — C563 Malignant neoplasm of bilateral ovaries: Secondary | ICD-10-CM

## 2016-04-27 DIAGNOSIS — I10 Essential (primary) hypertension: Secondary | ICD-10-CM | POA: Diagnosis not present

## 2016-04-27 DIAGNOSIS — I4891 Unspecified atrial fibrillation: Secondary | ICD-10-CM | POA: Diagnosis not present

## 2016-04-27 MED ORDER — ALTEPLASE 2 MG IJ SOLR
2.0000 mg | Freq: Once | INTRAMUSCULAR | Status: DC | PRN
  Filled 2016-04-27: qty 2

## 2016-04-27 MED ORDER — TBO-FILGRASTIM 480 MCG/0.8ML ~~LOC~~ SOSY
480.0000 ug | PREFILLED_SYRINGE | Freq: Once | SUBCUTANEOUS | Status: AC
  Administered 2016-04-27: 480 ug via SUBCUTANEOUS
  Filled 2016-04-27: qty 0.8

## 2016-04-27 NOTE — Patient Instructions (Signed)

## 2016-04-28 DIAGNOSIS — R2689 Other abnormalities of gait and mobility: Secondary | ICD-10-CM | POA: Diagnosis not present

## 2016-04-28 DIAGNOSIS — F322 Major depressive disorder, single episode, severe without psychotic features: Secondary | ICD-10-CM | POA: Diagnosis not present

## 2016-04-28 DIAGNOSIS — I63512 Cerebral infarction due to unspecified occlusion or stenosis of left middle cerebral artery: Secondary | ICD-10-CM | POA: Diagnosis not present

## 2016-04-28 DIAGNOSIS — I69351 Hemiplegia and hemiparesis following cerebral infarction affecting right dominant side: Secondary | ICD-10-CM | POA: Diagnosis not present

## 2016-04-28 DIAGNOSIS — F329 Major depressive disorder, single episode, unspecified: Secondary | ICD-10-CM | POA: Diagnosis not present

## 2016-04-28 DIAGNOSIS — F3341 Major depressive disorder, recurrent, in partial remission: Secondary | ICD-10-CM | POA: Insufficient documentation

## 2016-04-28 DIAGNOSIS — I10 Essential (primary) hypertension: Secondary | ICD-10-CM | POA: Diagnosis not present

## 2016-04-28 DIAGNOSIS — I4891 Unspecified atrial fibrillation: Secondary | ICD-10-CM | POA: Diagnosis not present

## 2016-04-28 DIAGNOSIS — C50919 Malignant neoplasm of unspecified site of unspecified female breast: Secondary | ICD-10-CM | POA: Diagnosis not present

## 2016-04-28 DIAGNOSIS — K5900 Constipation, unspecified: Secondary | ICD-10-CM | POA: Diagnosis not present

## 2016-04-29 ENCOUNTER — Telehealth: Payer: Self-pay | Admitting: Oncology

## 2016-04-29 ENCOUNTER — Other Ambulatory Visit: Payer: Self-pay | Admitting: Oncology

## 2016-04-29 DIAGNOSIS — I10 Essential (primary) hypertension: Secondary | ICD-10-CM | POA: Diagnosis not present

## 2016-04-29 DIAGNOSIS — C50919 Malignant neoplasm of unspecified site of unspecified female breast: Secondary | ICD-10-CM | POA: Diagnosis not present

## 2016-04-29 DIAGNOSIS — F329 Major depressive disorder, single episode, unspecified: Secondary | ICD-10-CM | POA: Diagnosis not present

## 2016-04-29 DIAGNOSIS — R2689 Other abnormalities of gait and mobility: Secondary | ICD-10-CM | POA: Diagnosis not present

## 2016-04-29 DIAGNOSIS — I4891 Unspecified atrial fibrillation: Secondary | ICD-10-CM | POA: Diagnosis not present

## 2016-04-29 DIAGNOSIS — I69351 Hemiplegia and hemiparesis following cerebral infarction affecting right dominant side: Secondary | ICD-10-CM | POA: Diagnosis not present

## 2016-04-29 NOTE — Telephone Encounter (Signed)
left msg for lab change to 11:45 on 5/25

## 2016-04-30 ENCOUNTER — Ambulatory Visit (HOSPITAL_BASED_OUTPATIENT_CLINIC_OR_DEPARTMENT_OTHER): Payer: Medicare Other | Admitting: Oncology

## 2016-04-30 ENCOUNTER — Other Ambulatory Visit: Payer: Self-pay

## 2016-04-30 ENCOUNTER — Telehealth: Payer: Self-pay

## 2016-04-30 ENCOUNTER — Ambulatory Visit: Payer: Medicare Other

## 2016-04-30 ENCOUNTER — Emergency Department (HOSPITAL_COMMUNITY)
Admission: EM | Admit: 2016-04-30 | Discharge: 2016-05-01 | Disposition: A | Discharge status: Home / Self Care | Payer: Medicare Other | Attending: Emergency Medicine | Admitting: Emergency Medicine

## 2016-04-30 ENCOUNTER — Encounter: Payer: Self-pay | Admitting: Oncology

## 2016-04-30 ENCOUNTER — Other Ambulatory Visit (HOSPITAL_BASED_OUTPATIENT_CLINIC_OR_DEPARTMENT_OTHER): Payer: Medicare Other

## 2016-04-30 ENCOUNTER — Emergency Department (HOSPITAL_COMMUNITY): Payer: Medicare Other

## 2016-04-30 ENCOUNTER — Other Ambulatory Visit: Payer: Medicare Other

## 2016-04-30 ENCOUNTER — Telehealth: Payer: Self-pay | Admitting: *Deleted

## 2016-04-30 ENCOUNTER — Encounter (HOSPITAL_COMMUNITY): Payer: Self-pay | Admitting: Emergency Medicine

## 2016-04-30 VITALS — BP 103/64 | HR 80 | Temp 98.7°F | Resp 18 | Ht 65.0 in | Wt 176.5 lb

## 2016-04-30 DIAGNOSIS — Z853 Personal history of malignant neoplasm of breast: Secondary | ICD-10-CM

## 2016-04-30 DIAGNOSIS — M1990 Unspecified osteoarthritis, unspecified site: Secondary | ICD-10-CM | POA: Insufficient documentation

## 2016-04-30 DIAGNOSIS — C563 Malignant neoplasm of bilateral ovaries: Secondary | ICD-10-CM

## 2016-04-30 DIAGNOSIS — C561 Malignant neoplasm of right ovary: Secondary | ICD-10-CM

## 2016-04-30 DIAGNOSIS — Z95828 Presence of other vascular implants and grafts: Secondary | ICD-10-CM

## 2016-04-30 DIAGNOSIS — M940 Chondrocostal junction syndrome [Tietze]: Secondary | ICD-10-CM

## 2016-04-30 DIAGNOSIS — I4891 Unspecified atrial fibrillation: Secondary | ICD-10-CM | POA: Diagnosis not present

## 2016-04-30 DIAGNOSIS — C562 Malignant neoplasm of left ovary: Secondary | ICD-10-CM

## 2016-04-30 DIAGNOSIS — Z7901 Long term (current) use of anticoagulants: Secondary | ICD-10-CM

## 2016-04-30 DIAGNOSIS — I10 Essential (primary) hypertension: Secondary | ICD-10-CM | POA: Insufficient documentation

## 2016-04-30 DIAGNOSIS — R509 Fever, unspecified: Secondary | ICD-10-CM | POA: Insufficient documentation

## 2016-04-30 DIAGNOSIS — Z79899 Other long term (current) drug therapy: Secondary | ICD-10-CM | POA: Diagnosis not present

## 2016-04-30 DIAGNOSIS — E785 Hyperlipidemia, unspecified: Secondary | ICD-10-CM | POA: Insufficient documentation

## 2016-04-30 DIAGNOSIS — C569 Malignant neoplasm of unspecified ovary: Secondary | ICD-10-CM

## 2016-04-30 DIAGNOSIS — Z8673 Personal history of transient ischemic attack (TIA), and cerebral infarction without residual deficits: Secondary | ICD-10-CM | POA: Diagnosis not present

## 2016-04-30 DIAGNOSIS — Z7982 Long term (current) use of aspirin: Secondary | ICD-10-CM | POA: Insufficient documentation

## 2016-04-30 DIAGNOSIS — G622 Polyneuropathy due to other toxic agents: Secondary | ICD-10-CM

## 2016-04-30 DIAGNOSIS — I69351 Hemiplegia and hemiparesis following cerebral infarction affecting right dominant side: Secondary | ICD-10-CM | POA: Diagnosis not present

## 2016-04-30 DIAGNOSIS — D6481 Anemia due to antineoplastic chemotherapy: Secondary | ICD-10-CM | POA: Diagnosis not present

## 2016-04-30 DIAGNOSIS — D701 Agranulocytosis secondary to cancer chemotherapy: Secondary | ICD-10-CM | POA: Diagnosis not present

## 2016-04-30 DIAGNOSIS — C50919 Malignant neoplasm of unspecified site of unspecified female breast: Secondary | ICD-10-CM | POA: Diagnosis not present

## 2016-04-30 DIAGNOSIS — F329 Major depressive disorder, single episode, unspecified: Secondary | ICD-10-CM | POA: Diagnosis not present

## 2016-04-30 DIAGNOSIS — G8191 Hemiplegia, unspecified affecting right dominant side: Secondary | ICD-10-CM

## 2016-04-30 DIAGNOSIS — R2689 Other abnormalities of gait and mobility: Secondary | ICD-10-CM | POA: Diagnosis not present

## 2016-04-30 DIAGNOSIS — T451X5A Adverse effect of antineoplastic and immunosuppressive drugs, initial encounter: Secondary | ICD-10-CM

## 2016-04-30 DIAGNOSIS — I517 Cardiomegaly: Secondary | ICD-10-CM | POA: Diagnosis not present

## 2016-04-30 LAB — BASIC METABOLIC PANEL
Anion gap: 7 (ref 5–15)
BUN: 15 mg/dL (ref 6–20)
CO2: 25 mmol/L (ref 22–32)
Calcium: 8.9 mg/dL (ref 8.9–10.3)
Chloride: 103 mmol/L (ref 101–111)
Creatinine, Ser: 0.65 mg/dL (ref 0.44–1.00)
GFR calc Af Amer: >60 mL/min (ref >60)
GFR calc non Af Amer: >60 mL/min (ref >60)
Glucose, Bld: 114 mg/dL — ABNORMAL HIGH (ref 65–99)
Potassium: 3.3 mmol/L — ABNORMAL LOW (ref 3.5–5.1)
Sodium: 135 mmol/L (ref 135–145)

## 2016-04-30 LAB — URINALYSIS, ROUTINE W REFLEX MICROSCOPIC
Bilirubin Urine: NEGATIVE
Glucose, UA: NEGATIVE mg/dL
Ketones, ur: NEGATIVE mg/dL
Nitrite: NEGATIVE
Protein, ur: NEGATIVE mg/dL
Specific Gravity, Urine: 1.005 (ref 1.005–1.030)
pH: 5.5 (ref 5.0–8.0)

## 2016-04-30 LAB — COMPREHENSIVE METABOLIC PANEL
ALT: 33 U/L (ref 0–55)
AST: 18 U/L (ref 5–34)
Albumin: 3.2 g/dL — ABNORMAL LOW (ref 3.5–5.0)
Alkaline Phosphatase: 86 U/L (ref 40–150)
Anion Gap: 8 mEq/L (ref 3–11)
BUN: 13.3 mg/dL (ref 7.0–26.0)
CO2: 25 mEq/L (ref 22–29)
Calcium: 9.4 mg/dL (ref 8.4–10.4)
Chloride: 106 mEq/L (ref 98–109)
Creatinine: 0.7 mg/dL (ref 0.6–1.1)
EGFR: >90 mL/min/{1.73_m2} (ref >90)
Glucose: 116 mg/dl (ref 70–140)
Potassium: 3.8 mEq/L (ref 3.5–5.1)
Sodium: 139 mEq/L (ref 136–145)
Total Bilirubin: 1.96 mg/dL — ABNORMAL HIGH (ref 0.20–1.20)
Total Protein: 6.5 g/dL (ref 6.4–8.3)

## 2016-04-30 LAB — CBC WITH DIFFERENTIAL/PLATELET
BASO%: 0.4 % (ref 0.0–2.0)
Basophils Absolute: 0 10*3/uL (ref 0.0–0.1)
Basophils Absolute: 0 10*3/uL (ref 0.0–0.1)
Basophils Relative: 0 %
EOS%: 1.5 % (ref 0.0–7.0)
Eosinophils Absolute: 0.1 10*3/uL (ref 0.0–0.5)
Eosinophils Absolute: 0 10*3/uL (ref 0.0–0.7)
Eosinophils Relative: 1 %
HCT: 36.5 % (ref 34.8–46.6)
HCT: 30.5 % — ABNORMAL LOW (ref 36.0–46.0)
HGB: 11.8 g/dL (ref 11.6–15.9)
Hemoglobin: 10.5 g/dL — ABNORMAL LOW (ref 12.0–15.0)
LYMPH%: 45.2 % (ref 14.0–49.7)
Lymphocytes Relative: 45 %
Lymphs Abs: 1.6 10*3/uL (ref 0.7–4.0)
MCH: 33.7 pg (ref 25.1–34.0)
MCH: 34 pg (ref 26.0–34.0)
MCHC: 32.3 g/dL (ref 31.5–36.0)
MCHC: 34.4 g/dL (ref 30.0–36.0)
MCV: 104.5 fL — ABNORMAL HIGH (ref 79.5–101.0)
MCV: 98.7 fL (ref 78.0–100.0)
MONO#: 0.2 10*3/uL (ref 0.1–0.9)
MONO%: 4.5 % (ref 0.0–14.0)
Monocytes Absolute: 0.4 10*3/uL (ref 0.1–1.0)
Monocytes Relative: 10 %
NEUT#: 1.7 10*3/uL (ref 1.5–6.5)
NEUT%: 48.4 % (ref 38.4–76.8)
Neutro Abs: 1.6 10*3/uL — ABNORMAL LOW (ref 1.7–7.7)
Neutrophils Relative %: 44 %
Platelets: 139 10*3/uL — ABNORMAL LOW (ref 145–400)
Platelets: 132 10*3/uL — ABNORMAL LOW (ref 150–400)
RBC: 3.49 10*6/uL — ABNORMAL LOW (ref 3.70–5.45)
RBC: 3.09 MIL/uL — ABNORMAL LOW (ref 3.87–5.11)
RDW: 15.2 % — ABNORMAL HIGH (ref 11.2–14.5)
RDW: 14.4 % (ref 11.5–15.5)
WBC: 3.4 10*3/uL — ABNORMAL LOW (ref 3.9–10.3)
WBC: 3.6 10*3/uL — ABNORMAL LOW (ref 4.0–10.5)
lymph#: 1.5 10*3/uL (ref 0.9–3.3)

## 2016-04-30 LAB — URINE MICROSCOPIC-ADD ON

## 2016-04-30 LAB — I-STAT CG4 LACTIC ACID, ED: Lactic Acid, Venous: 1.2 mmol/L (ref 0.5–2.0)

## 2016-04-30 MED ORDER — SODIUM CHLORIDE 0.9 % IJ SOLN
10.0000 mL | INTRAMUSCULAR | Status: DC | PRN
  Administered 2016-04-30: 10 mL via INTRAVENOUS
  Filled 2016-04-30: qty 10

## 2016-04-30 MED ORDER — DOCUSATE SODIUM 100 MG PO CAPS: 100.0000 mg | ORAL_CAPSULE | Freq: Two times a day (BID) | ORAL | Status: DC | PRN

## 2016-04-30 MED ORDER — SODIUM CHLORIDE 0.9 % IV BOLUS (SEPSIS)
1000.0000 mL | Freq: Once | INTRAVENOUS | Status: AC
  Administered 2016-04-30: 1000 mL via INTRAVENOUS

## 2016-04-30 MED ORDER — POTASSIUM CHLORIDE CRYS ER 20 MEQ PO TBCR
40.0000 meq | EXTENDED_RELEASE_TABLET | Freq: Once | ORAL | Status: AC
  Administered 2016-05-01: 40 meq via ORAL
  Filled 2016-04-30: qty 2

## 2016-04-30 MED ORDER — LIDOCAINE-PRILOCAINE 2.5-2.5 % EX CREA: TOPICAL_CREAM | CUTANEOUS | Status: DC

## 2016-04-30 MED ORDER — HEPARIN SOD (PORK) LOCK FLUSH 100 UNIT/ML IV SOLN
500.0000 [IU] | Freq: Once | INTRAVENOUS | Status: AC | PRN
  Administered 2016-04-30: 500 [IU] via INTRAVENOUS
  Filled 2016-04-30: qty 5

## 2016-04-30 NOTE — Progress Notes (Signed)
OFFICE PROGRESS NOTE   Apr 30, 2016   Physicians: Everitt Amber, Lendon Collar (PCP), Lindell Spar [phone] neurology [place]), Dr Valetta Fuller (cardiology [place], [phone], new patient apt 01-15-16) Carol Ada (Alapati P. Marcello Moores, PCP [place], [place] [phone]) ( (oncologist Dr Maudie Mercury in [place], [place]; breast radiation given at [facility] in [place]). 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 She had interval surgery by Dr Denman George on 03-24-16 and resumed dose dense carbo taxol on 04-24-16 with Day 1 cycle 4. Plan is to complete 6 cycles of chemotherapy.   Patient has complained of upper abdominal/ chest pain in last few days, tho history is difficult with communication problems since CVA. She stopped tramadol due to constipation, may get a little relief with tylenol. Severe constipation after surgery and after last chemo resolved with additional oral laxatives and bowels have been moving daily including yesterday and today. She has not had vomiting, SOB, fever. She is eating and drinking fluids. She has been more active with home PT/ OT each 2x weekly, and also continues speech therapy thru [facility]. She and family do not think the discomfort is related to exercise, tho she does have a difficult time getting up from couch, where she sits mostly. She has not taken Remeron regularly since that was recently prescribed. She is a little tearful. Bladder ok tho needs BSC. No neurologic changes. No increased peripheral neuropathy LUE/ LLE apparent Remainder of 10 point Review of Systems seems unchanged.    CA 125 12-24-15 by [employer] ECLIA 608, and on 01-01-16 741 PAC in Flu vaccine 10-10-15 No genetics testing known with prior breast cancer; with diagnosis now known ovarian, genetics testing would be  appropriate.   ONCOLOGIC HISTORY Patient presented to ED in [place] 11-28-15 with LLQ pain and left hip pain, symptomatic for at least a year per family but worse. CT AP 11-28-15 had bilateral ovarian masses up to 4 cm on right, with peritoneal carcinomatosis in pelvis, omentum and perihepatic, with small ascites, bilateral pelvic and inguinal adenopathy, with retroperitoneal adenopathy extending to pelvic brim. 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 Recommendation was to begin treatment with carboplatin taxol, with reevaluation after 3 cycles for possible interval debulking surgery. Day 1 cycle 1 dose dense carbo taxol was given on 12-27-15, then missed day 8 cycle 1 due to lack of IV access. ANC was 0.1 on day 14 (01-09-16) with granix given x3 and day 15 delayed another week. She received day 15 cycle 1 on 01-17-16 after PICC placed that day. She completed cycle 3 on 02-28-16. CT CAP 03-02-16 showed resolution of ascites and significant improvement in peritoneal disease and adenopathy. She had interval robotic TAH BSO omentectoy and right inguinal lymphadenectomy on 03-24-16, which was R0 debulking. Intraoperatively there appeared to be minimal residual peritoneal and omental disease, with enlarged right iliac node. Pathology [phone] high grade serous carcinoma primary left ovary where residual focal involvement with extensive fibrosis, and focal involvement of omentum also with extensive fibrosis, external iliac node and peritoneum.  She has history of left breast  cancer 2005 treated in [place] with lumpectomy, chemotherapy and radiation, none of those records have been obtained.  Objective:  Vital  signs in last 24 hours:  BP 103/64 mmHg  Pulse 80  Temp(Src) 98.7 F (37.1 C) (Oral)  Resp 18  Ht 5' 5"  (1.651 m)  Wt 176 lb 8 oz (80.06 kg)  BMI 29.37 kg/m2  SpO2 100% Weight down 3 lbs Alert, seems to understand conversation and situation. A little tearful, verbal communication frustrating for her. Respirations not labored. In WC, shifts positions without assistance and follows instructions. Does not appear in any acute distress.   HEENT:PERRL, sclerae not icteric. Oral mucosa moist without lesions, posterior pharynx clear.   No JVD.  Lymphatics:no cervical,supraclavicular adenopathy Resp: clear to auscultation bilaterally and normal percussion bilaterally Cardio: regular rate and rhythm. No gallop. GI: soft, nontender including epigastrium and upper quadrants, not distended, no mass or organomegaly. Normally active bowel sounds. Surgical incision not remarkable. Musculoskeletal/ Extremities:UE/ LE  without pitting edema, cords, tenderness. Marked point tenderness at costosternal junctions mid left chest reproducing pain.  Neuro: expressive speech difficulty, right hemiparesis.  Skin without rash including chest wall and upper abdomen, no ecchymosis, petechiae Portacath-without erythema or tenderness  Lab Results:  Results for orders placed or performed in visit on 04/30/16  CBC with Differential  Result Value Ref Range   WBC 3.4 (L) 3.9 - 10.3 10e3/uL   NEUT# 1.7 1.5 - 6.5 10e3/uL   HGB 11.8 11.6 - 15.9 g/dL   HCT 36.5 34.8 - 46.6 %   Platelets 139 (L) 145 - 400 10e3/uL   MCV 104.5 (H) 79.5 - 101.0 fL   MCH 33.7 25.1 - 34.0 pg   MCHC 32.3 31.5 - 36.0 g/dL   RBC 3.49 (L) 3.70 - 5.45 10e6/uL   RDW 15.2 (H) 11.2 - 14.5 %   lymph# 1.5 0.9 - 3.3 10e3/uL   MONO# 0.2 0.1 - 0.9 10e3/uL   Eosinophils Absolute 0.1 0.0 - 0.5 10e3/uL   Basophils Absolute 0.0 0.0 - 0.1 10e3/uL   NEUT% 48.4 38.4 - 76.8 %   LYMPH% 45.2 14.0 - 49.7 %   MONO% 4.5 0.0 - 14.0 %   EOS% 1.5 0.0 - 7.0 %    BASO% 0.4 0.0 - 2.0 %  Comprehensive metabolic panel  Result Value Ref Range   Sodium 139 136 - 145 mEq/L   Potassium 3.8 3.5 - 5.1 mEq/L   Chloride 106 98 - 109 mEq/L   CO2 25 22 - 29 mEq/L   Glucose 116 70 - 140 mg/dl   BUN 13.3 7.0 - 26.0 mg/dL   Creatinine 0.7 0.6 - 1.1 mg/dL   Total Bilirubin 1.96 (H) 0.20 - 1.20 mg/dL   Alkaline Phosphatase 86 40 - 150 U/L   AST 18 5 - 34 U/L   ALT 33 0 - 55 U/L   Total Protein 6.5 6.4 - 8.3 g/dL   Albumin 3.2 (L) 3.5 - 5.0 g/dL   Calcium 9.4 8.4 - 10.4 mg/dL   Anion Gap 8 3 - 11 mEq/L   EGFR >90 >90 ml/min/1.73 m2     Iron studies 04-13-16 with serum iron 72 and %sat 28  Have not rechecked CA 125 this close to surgery  Studies/Results:  No results found.  Medications: I have reviewed the patient's current medications. She is instructed to use aleve with food 1-2x daily next couple of days for apparent costochondritis, tho note is on Eliquis so should use oral NSAID sparingly for  short time.   DISCUSSION Patient and family feel she is able to continue chemo as planned, with day 8 cycle 4 on 5-26, with granix on 5-27.  Exam consistent with costochondritis, try OTC NSAID carefully next couple of days (on Eliquis), may need to consider topical voltaren instead of systemic if needs to continue a little longer. Likely some of the PT/OT exercising is contributing. If does not improve would get rib details films.  Assessment/Plan:  1.IVB high grade serous carcinoma of left ovary, treated to this point with 3 cycles of neoadjuvant dose dense carbo taxol, interval R0 debulking, and for additional chemo beginning 04-24-16. Chemo neutropenia with ANC 0.1 on day 14 cycle 1. Continue GCSF.days 2,3,9,16. Chemo peripheral neuropathy more difficult to assess with right CVA and communication difficulty. Will be very aware of this ongoing. Needs genetics referral, did not address with other concerns today 2. Exam consistent with acute costochondritis:  plan as above, with short term careful NSAID for acute symptoms. 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 Has resumed outpatient PT/ OT/ speech therapy by [facility]. Followed by neurology in [facility]. History of TIAs 2010.  4.atrial fibrillation diagnosed ~ 2011, now on Eliquis; ASA not listed on meds. Now followed by Dr Valetta Fuller [facility], next visit ~ 6 months. No problems with chemo thrombocytopenia thus far, but need to be aware of anticoagulation if so. 5.Left breast cancer 2005 diagnosed and treated in [place] with lumpectomy and presumably axillary node evaluation, combination chemotherapy (drugs not known) via peripheral veins, local radiation. Up to date on mammograms, done 10-18-15 at [facility].  6.hx HTN 7.Constipation from surgery and pain medication: Resolved with increased laxatives. Colon polyps at colonoscopy by Dr Benson Norway 2015. Follow up not specified on the report in EMR 8.post appendectomy, BTL 9.family history of cardiac disease with MIs in sisters ages 15 and 61, and in brother 10.flu vaccine done 10-2015 23.information on Advance Directives given 12.Marland KitchenPAC in, very difficult peripheral IV access 13.anemia: gradually progressive on chemo, on ferrous fumarate now. She has not been transfused.  All questions answered and patient/ family know to call if needed prior to next scheduled appointments. Route PCP, cc Drs Dellis Filbert and Powers. Chemo and granix orders confirmed. Time spent 25 min including >50% counseling and coordination of care.     LIVESAY,LENNIS P, MD   04/30/2016, 2:29 PM

## 2016-04-30 NOTE — ED Notes (Signed)
Pt is a breast CA patient and was seen by her nurse today and was told to come in for low BP. Last chemo 5/19. Alert and oriented per norm. Expressive aphasia from previous stroke.

## 2016-04-30 NOTE — Telephone Encounter (Signed)
"  My mother was there today.  She now feels cold and has a fever of 100.5.  She arrived home at 2:30pm took Aleve, her temp = 100.8.  Now temp = 100.5.  What does she need to do?  While tere the doctor said there may be inflammation in her lungs."    Advised ED for elevated temp with feeling cold.  Asked how port-a-cath site looks.  "I never look at that and she's not with me.  They called me so I'm calling.  When do I know to go to the ED."  Instructions repeated adding if she takes tylenol or ibuprofen and still has fever especially feeling cold with chills this is the time to go to the ED.  Will route call information to provider.

## 2016-04-30 NOTE — Patient Instructions (Signed)

## 2016-04-30 NOTE — Telephone Encounter (Signed)
[name] RN with [facility] called for extension of RN home visits for 3 more weeks at 1x/wk visitation. Pt is still having issues with constipation and medication balancing.  Verbal order per Dr Edwyna Shell given.

## 2016-05-01 ENCOUNTER — Other Ambulatory Visit: Payer: Self-pay | Admitting: Oncology

## 2016-05-01 ENCOUNTER — Other Ambulatory Visit: Payer: Medicare Other

## 2016-05-01 ENCOUNTER — Ambulatory Visit (HOSPITAL_BASED_OUTPATIENT_CLINIC_OR_DEPARTMENT_OTHER): Payer: Medicare Other

## 2016-05-01 ENCOUNTER — Telehealth: Payer: Self-pay

## 2016-05-01 VITALS — BP 112/68 | HR 66 | Temp 98.1°F | Resp 18

## 2016-05-01 DIAGNOSIS — Z5111 Encounter for antineoplastic chemotherapy: Secondary | ICD-10-CM

## 2016-05-01 DIAGNOSIS — F329 Major depressive disorder, single episode, unspecified: Secondary | ICD-10-CM | POA: Diagnosis not present

## 2016-05-01 DIAGNOSIS — C563 Malignant neoplasm of bilateral ovaries: Secondary | ICD-10-CM

## 2016-05-01 DIAGNOSIS — I69351 Hemiplegia and hemiparesis following cerebral infarction affecting right dominant side: Secondary | ICD-10-CM | POA: Diagnosis not present

## 2016-05-01 DIAGNOSIS — C561 Malignant neoplasm of right ovary: Secondary | ICD-10-CM

## 2016-05-01 DIAGNOSIS — C50919 Malignant neoplasm of unspecified site of unspecified female breast: Secondary | ICD-10-CM | POA: Diagnosis not present

## 2016-05-01 DIAGNOSIS — I4891 Unspecified atrial fibrillation: Secondary | ICD-10-CM | POA: Diagnosis not present

## 2016-05-01 DIAGNOSIS — C562 Malignant neoplasm of left ovary: Secondary | ICD-10-CM

## 2016-05-01 DIAGNOSIS — I10 Essential (primary) hypertension: Secondary | ICD-10-CM | POA: Diagnosis not present

## 2016-05-01 DIAGNOSIS — R2689 Other abnormalities of gait and mobility: Secondary | ICD-10-CM | POA: Diagnosis not present

## 2016-05-01 MED ORDER — FAMOTIDINE IN NACL 20-0.9 MG/50ML-% IV SOLN
INTRAVENOUS | Status: AC
  Filled 2016-05-01: qty 50

## 2016-05-01 MED ORDER — SODIUM CHLORIDE 0.9% FLUSH
10.0000 mL | Freq: Once | INTRAVENOUS | Status: AC
  Administered 2016-05-01: 10 mL via INTRAVENOUS
  Filled 2016-05-01: qty 10

## 2016-05-01 MED ORDER — SODIUM CHLORIDE 0.9 % IV SOLN: 20.0000 mg | Freq: Once | INTRAVENOUS | Status: DC

## 2016-05-01 MED ORDER — FAMOTIDINE IN NACL 20-0.9 MG/50ML-% IV SOLN
20.0000 mg | Freq: Once | INTRAVENOUS | Status: AC
  Administered 2016-05-01: 20 mg via INTRAVENOUS

## 2016-05-01 MED ORDER — DIPHENHYDRAMINE HCL 50 MG/ML IJ SOLN
25.0000 mg | Freq: Once | INTRAMUSCULAR | Status: AC
  Administered 2016-05-01: 25 mg via INTRAVENOUS

## 2016-05-01 MED ORDER — DIPHENHYDRAMINE HCL 50 MG/ML IJ SOLN
INTRAMUSCULAR | Status: AC
  Filled 2016-05-01: qty 1

## 2016-05-01 MED ORDER — SODIUM CHLORIDE 0.9 % IV SOLN
80.0000 mg/m2 | Freq: Once | INTRAVENOUS | Status: AC
  Administered 2016-05-01: 162 mg via INTRAVENOUS
  Filled 2016-05-01: qty 27

## 2016-05-01 MED ORDER — SODIUM CHLORIDE 0.9 % IV SOLN: Freq: Once | INTRAVENOUS | Status: DC

## 2016-05-01 MED ORDER — SODIUM CHLORIDE 0.9 % IV SOLN
Freq: Once | INTRAVENOUS | Status: AC
  Administered 2016-05-01: 11:00:00 via INTRAVENOUS

## 2016-05-01 MED ORDER — HEPARIN SOD (PORK) LOCK FLUSH 100 UNIT/ML IV SOLN
500.0000 [IU] | Freq: Once | INTRAVENOUS | Status: AC
  Administered 2016-05-01: 500 [IU] via INTRAVENOUS
  Filled 2016-05-01: qty 5

## 2016-05-01 MED ORDER — CIPROFLOXACIN HCL 500 MG PO TABS
500.0000 mg | ORAL_TABLET | Freq: Once | ORAL | Status: AC
  Administered 2016-05-01: 500 mg via ORAL
  Filled 2016-05-01: qty 1

## 2016-05-01 MED ORDER — SODIUM CHLORIDE 0.9 % IV SOLN
Freq: Once | INTRAVENOUS | Status: AC
  Administered 2016-05-01: 12:00:00 via INTRAVENOUS
  Filled 2016-05-01: qty 4

## 2016-05-01 NOTE — Patient Instructions (Signed)
[place] Discharge Instructions for Patients Receiving Chemotherapy  Today you received the following chemotherapy agents: Taxol.  To help prevent nausea and vomiting after your treatment, we encourage you to take your nausea medication: Compazine 10 mg every 6 hours as needed; Zofran 8 mg every 8 hours as needed.   If you develop nausea and vomiting that is not controlled by your nausea medication, call the clinic.   BELOW ARE SYMPTOMS THAT SHOULD BE REPORTED IMMEDIATELY:  *FEVER GREATER THAN 100.5 F  *CHILLS WITH OR WITHOUT FEVER  NAUSEA AND VOMITING THAT IS NOT CONTROLLED WITH YOUR NAUSEA MEDICATION  *UNUSUAL SHORTNESS OF BREATH  *UNUSUAL BRUISING OR BLEEDING  TENDERNESS IN MOUTH AND THROAT WITH OR WITHOUT PRESENCE OF ULCERS  *URINARY PROBLEMS  *BOWEL PROBLEMS  UNUSUAL RASH Items with * indicate a potential emergency and should be followed up as soon as possible.  Feel free to call the clinic you have any questions or concerns. The clinic phone number is [phone].  Please show the [facility] at check-in to the Emergency Department and triage nurse.

## 2016-05-01 NOTE — Progress Notes (Signed)
Ok to treat with T. Bili 1.96 per Dr. Marko Plume

## 2016-05-01 NOTE — ED Provider Notes (Signed)
CSN: [id number]     Arrival date & time 04/30/16  2103 History   First MD Initiated Contact with Patient 04/30/16 2129     Chief Complaint  Patient presents with  . Chemo Card     . Hypotension     (Consider location/radiation/quality/duration/timing/severity/associated sxs/prior Treatment) Patient is a 65 y.o. female presenting with fever. The history is provided by the patient (Patient states that she had a fever this afternoon. But she has felt fine. She has a history of ovarian cancer and a scant chemotherapy).  Fever Temp source:  Subjective Severity:  Mild Onset quality:  Sudden Duration: Minute. Timing:  Rare Progression:  Resolved Chronicity:  New Relieved by:  Nothing Worsened by:  Nothing tried Ineffective treatments:  None tried Associated symptoms: no chest pain, no congestion, no cough, no diarrhea, no headaches and no rash     Past Medical History  Diagnosis Date  . Hypertension   . Arthritis   . A-fib ([place])   . Hyperlipidemia   . Wears glasses   . Anxiety   . Complication of anesthesia     DIFFICULTY WAKING UP FROM ANESTHESIA  . Stroke ([place]) 07/29/2015  . Breast cancer ([place]) 2008    s/p lumpectomy, chemo and radiation therapy   Past Surgical History  Procedure Laterality Date  . Breast lumpectomy  2008    left  . Appendectomy    . Tubal ligation    . Colonoscopy  2004  . Robotic assisted total hysterectomy with bilateral salpingo oopherectomy Bilateral 03/24/2016    Procedure: XI ROBOTIC ASSISTED TOTA LAPAROSCOPIC  HYSTERECTOMY WITH BILATERAL SALPINGO OOPHORECTOMY;  Surgeon: Everitt Amber, MD;  Location: [facility];  Service: Gynecology;  Laterality: Bilateral;  . Omentectomy N/A 03/24/2016    Procedure: OMENTECTOMY;  Surgeon: Everitt Amber, MD;  Location: [facility];  Service: Gynecology;  Laterality: N/A;  . Debulking N/A 03/24/2016    Procedure: RADICAL TUMOR DEBULKING;  Surgeon: Everitt Amber, MD;  Location: [facility];  Service: Gynecology;  Laterality: N/A;   Family  History  Problem Relation Age of Onset  . Family history unknown: Yes   Social History  Substance Use Topics  . Smoking status: Never Smoker   . Smokeless tobacco: None  . Alcohol Use: No   OB History    No data available     Review of Systems  Constitutional: Positive for fever. Negative for appetite change and fatigue.  HENT: Negative for congestion, ear discharge and sinus pressure.   Eyes: Negative for discharge.  Respiratory: Negative for cough.   Cardiovascular: Negative for chest pain.  Gastrointestinal: Negative for abdominal pain and diarrhea.  Genitourinary: Negative for frequency and hematuria.  Musculoskeletal: Negative for back pain.  Skin: Negative for rash.  Neurological: Negative for seizures and headaches.  Psychiatric/Behavioral: Negative for hallucinations.      Allergies  Codeine and Latex  Home Medications   Prior to Admission medications   Medication Sig Start Date End Date Taking? Authorizing Provider  ALPRAZolam (XANAX) 0.25 MG tablet Take 0.25 mg by mouth 3 (three) times daily as needed for anxiety.   Yes Historical Provider, MD  apixaban (ELIQUIS) 5 MG TABS tablet Take 1 tablet (5 mg total) by mouth 2 (two) times daily. 09/11/15  Yes Camelia Eng Tysinger, PA-C  aspirin EC 81 MG tablet Take 81 mg by mouth daily.   Yes Historical Provider, MD  atorvastatin (LIPITOR) 40 MG tablet Take 40 mg by mouth at bedtime.   Yes Historical Provider, MD  docusate sodium (COLACE) 100 MG capsule Take 1 capsule (100 mg total) by mouth 2 (two) times daily as needed for mild constipation. 04/30/16  Yes Lennis Marion Downer, MD  lidocaine-prilocaine (EMLA) cream Apply to Porta-Cath 1-2 hours prior to access as directed. 04/30/16  Yes Lennis Marion Downer, MD  metoprolol tartrate (LOPRESSOR) 25 MG tablet Take 25 mg by mouth 2 (two) times daily.  03/10/16  Yes Historical Provider, MD  mineral oil enema Place 133 mLs (1 enema total) rectally once. 04/17/16  Yes Shari Upstill, PA-C   mirtazapine (REMERON) 15 MG tablet Take 15 mg by mouth at bedtime. Reported on 04/13/2016 04/07/16 05/07/16 Yes Historical Provider, MD  Multiple Vitamins-Minerals (MULTIVITAMIN WITH MINERALS) tablet Take 1 tablet by mouth daily.   Yes Historical Provider, MD  naproxen sodium (ANAPROX) 220 MG tablet Take 220 mg by mouth daily as needed (pain).   Yes Historical Provider, MD  omega-3 acid ethyl esters (LOVAZA) 1 g capsule Take 1 g by mouth daily.   Yes Historical Provider, MD  PARoxetine (PAXIL) 20 MG tablet TAKE 1 TABLET BY MOUTH DAILY Patient taking differently: TAKE 20 MG BY MOUTH DAILY 06/03/15  Yes Camelia Eng Tysinger, PA-C  verapamil (CALAN-SR) 240 MG CR tablet Take 1 tablet (240 mg total) by mouth daily. 07/25/15  Yes Camelia Eng Tysinger, PA-C  acetaminophen (TYLENOL) 500 MG tablet Take 500-1,000 mg by mouth every 6 (six) hours as needed for moderate pain. Reported on 02/03/2016    Historical Provider, MD  dexamethasone (DECADRON) 4 MG tablet Take 5 tablets =20 mg with food 12 hrs before Taxol Chemotherapy 04/13/16   Lennis Marion Downer, MD  ferrous fumarate (HEMOCYTE - 106 MG FE) 325 (106 Fe) MG TABS tablet Take 1 tablet daly on an empty stomach with OJ or Vitamin C Patient not taking: Reported on 04/10/2016 03/17/16   Lennis Marion Downer, MD  loratadine (CLARITIN) 10 MG tablet Take 10 mg by mouth daily as needed (Recommended for chemo pain). Reported on 03/16/2016    Historical Provider, MD  LORazepam (ATIVAN) 0.5 MG tablet Place 1 tablet under the tongue or swallow every 6 hours as needed for nausea. Will make you drowsy. Patient not taking: Reported on 04/10/2016 12/24/15   Lennis P Marko Plume, MD  ondansetron (ZOFRAN) 8 MG tablet Take 1 tablet (8 mg total) by mouth every 8 (eight) hours as needed for nausea or vomiting (Will not make drowsy.). Patient not taking: Reported on 03/16/2016 12/24/15   Gordy Levan, MD  prochlorperazine (COMPAZINE) 10 MG tablet Take 1 tablet (10 mg total) by mouth every 6 (six) hours as  needed for nausea or vomiting. Patient not taking: Reported on 04/13/2016 12/26/15   Gordy Levan, MD  traMADol (ULTRAM) 50 MG tablet Take 2 tablets (100 mg total) by mouth every 12 (twelve) hours as needed for moderate pain. Patient not taking: Reported on 04/30/2016 03/25/16   Everitt Amber, MD   BP 115/82 mmHg  Pulse 86  Temp(Src) 98.3 F (36.8 C) (Oral)  Resp 19  SpO2 100% Physical Exam  Constitutional: She is oriented to person, place, and time. She appears well-developed.  HENT:  Head: Normocephalic.  Eyes: Conjunctivae and EOM are normal. No scleral icterus.  Neck: Neck supple. No thyromegaly present.  Cardiovascular: Normal rate and regular rhythm.  Exam reveals no gallop and no friction rub.   No murmur heard. Pulmonary/Chest: No stridor. She has no wheezes. She has no rales. She exhibits no tenderness.  Abdominal: She exhibits no  distension. There is no tenderness. There is no rebound.  Musculoskeletal: Normal range of motion. She exhibits no edema.  Lymphadenopathy:    She has no cervical adenopathy.  Neurological: She is oriented to person, place, and time. She exhibits normal muscle tone. Coordination normal.  Skin: No rash noted. No erythema.  Psychiatric: She has a normal mood and affect. Her behavior is normal.    ED Course  Procedures (including critical care time) Labs Review Labs Reviewed  CBC WITH DIFFERENTIAL/PLATELET - Abnormal; Notable for the following:    WBC 3.6 (*)    RBC 3.09 (*)    Hemoglobin 10.5 (*)    HCT 30.5 (*)    Platelets 132 (*)    Neutro Abs 1.6 (*)    All other components within normal limits  BASIC METABOLIC PANEL - Abnormal; Notable for the following:    Potassium 3.3 (*)    Glucose, Bld 114 (*)    All other components within normal limits  URINALYSIS, ROUTINE W REFLEX MICROSCOPIC (NOT AT [facility]) - Abnormal; Notable for the following:    Hgb urine dipstick TRACE (*)    Leukocytes, UA TRACE (*)    All other components within normal  limits  URINE MICROSCOPIC-ADD ON - Abnormal; Notable for the following:    Squamous Epithelial / LPF 6-30 (*)    Bacteria, UA RARE (*)    All other components within normal limits  CULTURE, BLOOD (ROUTINE X 2)  CULTURE, BLOOD (ROUTINE X 2)  I-STAT CG4 LACTIC ACID, ED  I-STAT CG4 LACTIC ACID, ED    Imaging Review Dg Chest 2 View  04/30/2016  CLINICAL DATA:  Low blood pressure tonight. Undergoing chemotherapy for breast cancer EXAM: CHEST  2 VIEW COMPARISON:  Chest x-ray dated 03/14/2016. FINDINGS: Cardiomegaly is stable. Overall cardiomediastinal silhouette is stable in size and configuration. Right chest wall Port-A-Cath is stable in position with tip at the level of the mid SVC. Surgical clips again noted over the left chest wall. Chronic scarring/fibrosis again noted at the right lung apex. Lungs otherwise clear. No evidence of pneumonia. No pleural effusion seen. Osseous structures about the chest are unremarkable. IMPRESSION: No active cardiopulmonary disease.  Stable cardiomegaly. Electronically Signed   By: Franki Cabot M.D.   On: 04/30/2016 21:58   I have personally reviewed and evaluated these images and lab results as part of my medical decision-making.   EKG Interpretation None      MDM   Final diagnoses:  Other specified fever   Patient is on chemotherapy and supposedly had a low blood pressure and fever today. Her vital signs have been normal in the emergency department. Labs were unremarkable patient had cultures done to blood. Patient seemed stable for discharge home. She is to follow-up with her doctor tomorrow. Patient will be given 1 dose of Cipro in the emergency department   Milton Ferguson, MD 05/01/16 [phone]

## 2016-05-01 NOTE — Discharge Instructions (Signed)
Let your doctor know  tomorrow when you go to the clinic that you were in the emergency department today

## 2016-05-01 NOTE — Telephone Encounter (Signed)
[name] RN with [facility] called requesting authorization for recertifing pt for 60 days for [facility] RN, speech therapy and physical therapy. [phone] called back with Verbal order given from Dr Marko Plume.

## 2016-05-02 ENCOUNTER — Ambulatory Visit (HOSPITAL_BASED_OUTPATIENT_CLINIC_OR_DEPARTMENT_OTHER): Payer: Medicare Other

## 2016-05-02 VITALS — BP 103/88 | HR 97 | Temp 98.6°F | Resp 16

## 2016-05-02 DIAGNOSIS — C561 Malignant neoplasm of right ovary: Secondary | ICD-10-CM

## 2016-05-02 DIAGNOSIS — C562 Malignant neoplasm of left ovary: Secondary | ICD-10-CM | POA: Diagnosis not present

## 2016-05-02 DIAGNOSIS — D701 Agranulocytosis secondary to cancer chemotherapy: Secondary | ICD-10-CM

## 2016-05-02 DIAGNOSIS — M940 Chondrocostal junction syndrome [Tietze]: Secondary | ICD-10-CM | POA: Insufficient documentation

## 2016-05-02 DIAGNOSIS — C563 Malignant neoplasm of bilateral ovaries: Secondary | ICD-10-CM

## 2016-05-02 MED ORDER — TBO-FILGRASTIM 480 MCG/0.8ML ~~LOC~~ SOSY
480.0000 ug | PREFILLED_SYRINGE | Freq: Once | SUBCUTANEOUS | Status: AC
  Administered 2016-05-02: 480 ug via SUBCUTANEOUS

## 2016-05-02 NOTE — Patient Instructions (Signed)

## 2016-05-04 DIAGNOSIS — F329 Major depressive disorder, single episode, unspecified: Secondary | ICD-10-CM | POA: Diagnosis not present

## 2016-05-04 DIAGNOSIS — I69390 Apraxia following cerebral infarction: Secondary | ICD-10-CM | POA: Diagnosis not present

## 2016-05-04 DIAGNOSIS — I4891 Unspecified atrial fibrillation: Secondary | ICD-10-CM | POA: Diagnosis not present

## 2016-05-04 DIAGNOSIS — I69351 Hemiplegia and hemiparesis following cerebral infarction affecting right dominant side: Secondary | ICD-10-CM | POA: Diagnosis not present

## 2016-05-04 DIAGNOSIS — I10 Essential (primary) hypertension: Secondary | ICD-10-CM | POA: Diagnosis not present

## 2016-05-04 DIAGNOSIS — I69320 Aphasia following cerebral infarction: Secondary | ICD-10-CM | POA: Diagnosis not present

## 2016-05-04 DIAGNOSIS — C50919 Malignant neoplasm of unspecified site of unspecified female breast: Secondary | ICD-10-CM | POA: Diagnosis not present

## 2016-05-04 DIAGNOSIS — K5900 Constipation, unspecified: Secondary | ICD-10-CM | POA: Diagnosis not present

## 2016-05-04 DIAGNOSIS — F419 Anxiety disorder, unspecified: Secondary | ICD-10-CM | POA: Diagnosis not present

## 2016-05-05 ENCOUNTER — Telehealth: Payer: Self-pay

## 2016-05-05 DIAGNOSIS — C50919 Malignant neoplasm of unspecified site of unspecified female breast: Secondary | ICD-10-CM | POA: Diagnosis not present

## 2016-05-05 DIAGNOSIS — I69351 Hemiplegia and hemiparesis following cerebral infarction affecting right dominant side: Secondary | ICD-10-CM | POA: Diagnosis not present

## 2016-05-05 DIAGNOSIS — K5900 Constipation, unspecified: Secondary | ICD-10-CM | POA: Diagnosis not present

## 2016-05-05 DIAGNOSIS — F329 Major depressive disorder, single episode, unspecified: Secondary | ICD-10-CM | POA: Diagnosis not present

## 2016-05-05 DIAGNOSIS — I69320 Aphasia following cerebral infarction: Secondary | ICD-10-CM | POA: Diagnosis not present

## 2016-05-05 DIAGNOSIS — I69390 Apraxia following cerebral infarction: Secondary | ICD-10-CM | POA: Diagnosis not present

## 2016-05-05 LAB — CULTURE, BLOOD (ROUTINE X 2)
Culture: NO GROWTH
Culture: NO GROWTH

## 2016-05-05 NOTE — Telephone Encounter (Addendum)
S/w [name] that per Dr Edwyna Shell pt is to stop the aleve and use her tramadol for pain. Pt is to continue her aspirin.  She relayed message to pt in the background on the phone call.

## 2016-05-05 NOTE — Telephone Encounter (Signed)
Brenda Cunningham states pt is feeling worse on Sunday. Saturday was a good day. She is now hurting all over, achey. Aleve not helping. Clarified yes she is taking ASA 81 daily as well as eliquis. Mentioned voltaren to tonya, pt has medicare paying for some medications. Pt puts off taking pain medications b/c she does not like the side effects of lightheadedness.   S/w [name] RN from [facility]. Pt c/o general achey that aleve helped only a little. [name] reinforced using tramadol prn. BP was 98/70 and HR from 96-106. [name] noted new irregularity in heart rhythm and was questioning if pt has a-fib. Per problem list pt does have a-fib.   Verbal order per Dr Edwyna Shell given to [facility] for bedside commode.

## 2016-05-05 NOTE — Telephone Encounter (Signed)
-----   Message from Gordy Levan, MD sent at 05/02/2016 11:18 AM EDT ----- Please follow up with daughter or son early next week re costochondritis symptoms.  Best not to continue the Aleve/ NSAID regularly due to Eliquis (and please clarify if she is on ASA also?) She could try ice to the chest wall. Topical voltaren probably does not have as much systemic effect as oral NSAID, but is expensive if she is paying out of pocket.  thanks

## 2016-05-06 ENCOUNTER — Telehealth: Payer: Self-pay | Admitting: *Deleted

## 2016-05-06 DIAGNOSIS — K5900 Constipation, unspecified: Secondary | ICD-10-CM | POA: Diagnosis not present

## 2016-05-06 DIAGNOSIS — C50919 Malignant neoplasm of unspecified site of unspecified female breast: Secondary | ICD-10-CM | POA: Diagnosis not present

## 2016-05-06 DIAGNOSIS — I69351 Hemiplegia and hemiparesis following cerebral infarction affecting right dominant side: Secondary | ICD-10-CM | POA: Diagnosis not present

## 2016-05-06 DIAGNOSIS — I69320 Aphasia following cerebral infarction: Secondary | ICD-10-CM | POA: Diagnosis not present

## 2016-05-06 DIAGNOSIS — F329 Major depressive disorder, single episode, unspecified: Secondary | ICD-10-CM | POA: Diagnosis not present

## 2016-05-06 DIAGNOSIS — I69390 Apraxia following cerebral infarction: Secondary | ICD-10-CM | POA: Diagnosis not present

## 2016-05-06 NOTE — Telephone Encounter (Signed)
Received call from [place] therapist with [facility]. Patient's BP 82/57. HR 84. States that patient took all am meds which she believes had her Metoprolol. Patient is drinking fluids and denies dizziness, but does feel weak. Patient was seen in ED this past Friday for low BP. Olin Hauser to have family re check BP in 1 hour and if it remains low then she needs to go to the ED. Ebony Hail will also call patient's primary physician who prescribed Metoprolol.

## 2016-05-07 ENCOUNTER — Ambulatory Visit (HOSPITAL_COMMUNITY): Payer: Medicare Other | Admitting: Psychiatry

## 2016-05-07 DIAGNOSIS — C50919 Malignant neoplasm of unspecified site of unspecified female breast: Secondary | ICD-10-CM | POA: Diagnosis not present

## 2016-05-07 DIAGNOSIS — F329 Major depressive disorder, single episode, unspecified: Secondary | ICD-10-CM | POA: Diagnosis not present

## 2016-05-07 DIAGNOSIS — I69390 Apraxia following cerebral infarction: Secondary | ICD-10-CM | POA: Diagnosis not present

## 2016-05-07 DIAGNOSIS — K5900 Constipation, unspecified: Secondary | ICD-10-CM | POA: Diagnosis not present

## 2016-05-07 DIAGNOSIS — I69351 Hemiplegia and hemiparesis following cerebral infarction affecting right dominant side: Secondary | ICD-10-CM | POA: Diagnosis not present

## 2016-05-07 DIAGNOSIS — I69320 Aphasia following cerebral infarction: Secondary | ICD-10-CM | POA: Diagnosis not present

## 2016-05-08 ENCOUNTER — Telehealth: Payer: Self-pay

## 2016-05-08 ENCOUNTER — Other Ambulatory Visit: Payer: Self-pay | Admitting: Oncology

## 2016-05-08 ENCOUNTER — Ambulatory Visit (HOSPITAL_BASED_OUTPATIENT_CLINIC_OR_DEPARTMENT_OTHER): Payer: Medicare Other

## 2016-05-08 ENCOUNTER — Ambulatory Visit: Payer: Medicare Other

## 2016-05-08 ENCOUNTER — Other Ambulatory Visit (HOSPITAL_BASED_OUTPATIENT_CLINIC_OR_DEPARTMENT_OTHER): Payer: Medicare Other

## 2016-05-08 VITALS — BP 120/71 | HR 72 | Temp 97.8°F

## 2016-05-08 DIAGNOSIS — C563 Malignant neoplasm of bilateral ovaries: Secondary | ICD-10-CM

## 2016-05-08 DIAGNOSIS — C561 Malignant neoplasm of right ovary: Secondary | ICD-10-CM

## 2016-05-08 DIAGNOSIS — Z5189 Encounter for other specified aftercare: Secondary | ICD-10-CM | POA: Diagnosis not present

## 2016-05-08 DIAGNOSIS — Z95828 Presence of other vascular implants and grafts: Secondary | ICD-10-CM

## 2016-05-08 DIAGNOSIS — C562 Malignant neoplasm of left ovary: Secondary | ICD-10-CM

## 2016-05-08 LAB — COMPREHENSIVE METABOLIC PANEL
ALT: 34 U/L (ref 0–55)
AST: 20 U/L (ref 5–34)
Albumin: 2.9 g/dL — ABNORMAL LOW (ref 3.5–5.0)
Alkaline Phosphatase: 111 U/L (ref 40–150)
Anion Gap: 10 mEq/L (ref 3–11)
BUN: 11.9 mg/dL (ref 7.0–26.0)
CO2: 24 mEq/L (ref 22–29)
Calcium: 10.2 mg/dL (ref 8.4–10.4)
Chloride: 107 mEq/L (ref 98–109)
Creatinine: 0.8 mg/dL (ref 0.6–1.1)
EGFR: >90 mL/min/{1.73_m2} (ref >90)
Glucose: 188 mg/dl — ABNORMAL HIGH (ref 70–140)
Potassium: 3.9 mEq/L (ref 3.5–5.1)
Sodium: 141 mEq/L (ref 136–145)
Total Bilirubin: 0.49 mg/dL (ref 0.20–1.20)
Total Protein: 7.2 g/dL (ref 6.4–8.3)

## 2016-05-08 LAB — CBC WITH DIFFERENTIAL/PLATELET
BASO%: 0.4 % (ref 0.0–2.0)
Basophils Absolute: 0 10*3/uL (ref 0.0–0.1)
EOS%: 0.1 % (ref 0.0–7.0)
Eosinophils Absolute: 0 10*3/uL (ref 0.0–0.5)
HCT: 32 % — ABNORMAL LOW (ref 34.8–46.6)
HGB: 10.4 g/dL — ABNORMAL LOW (ref 11.6–15.9)
LYMPH%: 53.3 % — ABNORMAL HIGH (ref 14.0–49.7)
MCH: 33.6 pg (ref 25.1–34.0)
MCHC: 32.6 g/dL (ref 31.5–36.0)
MCV: 102.9 fL — ABNORMAL HIGH (ref 79.5–101.0)
MONO#: 0 10*3/uL — ABNORMAL LOW (ref 0.1–0.9)
MONO%: 3.6 % (ref 0.0–14.0)
NEUT#: 0.4 10*3/uL — CL (ref 1.5–6.5)
NEUT%: 42.6 % (ref 38.4–76.8)
Platelets: 167 10*3/uL (ref 145–400)
RBC: 3.11 10*6/uL — ABNORMAL LOW (ref 3.70–5.45)
RDW: 14.7 % — ABNORMAL HIGH (ref 11.2–14.5)
WBC: 1 10*3/uL — ABNORMAL LOW (ref 3.9–10.3)
lymph#: 0.5 10*3/uL — ABNORMAL LOW (ref 0.9–3.3)

## 2016-05-08 MED ORDER — TBO-FILGRASTIM 480 MCG/0.8ML ~~LOC~~ SOSY
480.0000 ug | PREFILLED_SYRINGE | Freq: Once | SUBCUTANEOUS | Status: AC
  Administered 2016-05-08: 480 ug via SUBCUTANEOUS
  Filled 2016-05-08: qty 0.8

## 2016-05-08 MED ORDER — SODIUM CHLORIDE 0.9 % IJ SOLN
10.0000 mL | INTRAMUSCULAR | Status: DC | PRN
  Administered 2016-05-08: 10 mL via INTRAVENOUS
  Filled 2016-05-08: qty 10

## 2016-05-08 MED ORDER — HEPARIN SOD (PORK) LOCK FLUSH 100 UNIT/ML IV SOLN
500.0000 [IU] | Freq: Once | INTRAVENOUS | Status: AC | PRN
  Administered 2016-05-08: 500 [IU] via INTRAVENOUS
  Filled 2016-05-08: qty 5

## 2016-05-08 NOTE — Progress Notes (Signed)
Pt WBC/ANC too low for treatment.  Discussed with Dr Marko Plume & treatment cancelled for today.  Pt to return for regular appt next week.  Discussed neutropenic precautions with pt & son.

## 2016-05-08 NOTE — Patient Instructions (Signed)

## 2016-05-08 NOTE — Telephone Encounter (Signed)
Kenney Houseman called to find out what her mother's BP was today. The labs were checked and inadequate for treatment. S/w [place] and got BP 120/71 with HR 72. Called [name] back with this data.

## 2016-05-08 NOTE — Telephone Encounter (Signed)
Brenda Cunningham called for advice. Her mother is displaying actions that denote pain, such as shaking in her legs, balling of her hands and facial expressions. When she asks her mother what is wrong, are you in pain her mother denies anything wrong and will stop the actions.  Pt wanting to take tylenol stating "I gotta get rid of this feeling" but she cannot define what she is feeling. She is a bit jittery - she will just jump up out of chair for no apparent reason. Brenda Cunningham asked if pt can take xanax, it is on her MAR. Discussed pt has not used ativan recently. Discussed taking xanax now and tramadol in about an hour and see how pt responds. If not better or worsens may need to go to ED. Brenda Cunningham asked what to tell ED personnel at that time, suggested "something is wrong, I cannot tell what" and describe her mother's actions. Discussed granix can make legs and back and joints achey.

## 2016-05-09 ENCOUNTER — Ambulatory Visit (HOSPITAL_BASED_OUTPATIENT_CLINIC_OR_DEPARTMENT_OTHER): Payer: Medicare Other

## 2016-05-09 VITALS — BP 104/73 | HR 100 | Temp 98.3°F | Resp 19 | Ht 65.0 in

## 2016-05-09 DIAGNOSIS — D701 Agranulocytosis secondary to cancer chemotherapy: Secondary | ICD-10-CM

## 2016-05-09 DIAGNOSIS — C561 Malignant neoplasm of right ovary: Secondary | ICD-10-CM | POA: Diagnosis present

## 2016-05-09 DIAGNOSIS — C562 Malignant neoplasm of left ovary: Secondary | ICD-10-CM

## 2016-05-09 DIAGNOSIS — C563 Malignant neoplasm of bilateral ovaries: Secondary | ICD-10-CM

## 2016-05-09 MED ORDER — TBO-FILGRASTIM 480 MCG/0.8ML ~~LOC~~ SOSY
480.0000 ug | PREFILLED_SYRINGE | Freq: Once | SUBCUTANEOUS | Status: AC
  Administered 2016-05-09: 480 ug via SUBCUTANEOUS
  Filled 2016-05-09: qty 0.8

## 2016-05-09 NOTE — Patient Instructions (Signed)

## 2016-05-10 ENCOUNTER — Other Ambulatory Visit: Payer: Self-pay | Admitting: Oncology

## 2016-05-10 DIAGNOSIS — C562 Malignant neoplasm of left ovary: Secondary | ICD-10-CM

## 2016-05-11 ENCOUNTER — Telehealth: Payer: Self-pay

## 2016-05-11 DIAGNOSIS — K5900 Constipation, unspecified: Secondary | ICD-10-CM | POA: Diagnosis not present

## 2016-05-11 DIAGNOSIS — I69320 Aphasia following cerebral infarction: Secondary | ICD-10-CM | POA: Diagnosis not present

## 2016-05-11 DIAGNOSIS — C50919 Malignant neoplasm of unspecified site of unspecified female breast: Secondary | ICD-10-CM | POA: Diagnosis not present

## 2016-05-11 DIAGNOSIS — I69390 Apraxia following cerebral infarction: Secondary | ICD-10-CM | POA: Diagnosis not present

## 2016-05-11 DIAGNOSIS — I69351 Hemiplegia and hemiparesis following cerebral infarction affecting right dominant side: Secondary | ICD-10-CM | POA: Diagnosis not present

## 2016-05-11 DIAGNOSIS — F329 Major depressive disorder, single episode, unspecified: Secondary | ICD-10-CM | POA: Diagnosis not present

## 2016-05-11 NOTE — Telephone Encounter (Signed)
Spoke with Brenda Cunningham to f/u with what the cardiologist said about the metoprolol dose.  She stated that she did not have a chance to call.  Her mother was doing well today.  She is to bring BP readings to Dr. Mariana Kaufman appointment 05-14-16.   Daughter stated that the dose of metoprolol is 12.5 mg bid not 25 mg bid as noted on med list. Corrected dose in EMR

## 2016-05-11 NOTE — Telephone Encounter (Signed)
Brenda Cunningham said that her mother is doing fine . She vomited Friday but nausea med effective. Afebrile. BP yesterday 114/67 pulse=104 Brenda Cunningham stated that PCP said to call cardiologist to see about metoprolol.  She will call  this am and inquire about medication and make an appointment. Brenda Cunningham is going out of the country on vacation tomorrow for 4 days. Brenda Cunningham will be heading over to her mother's house shortly.  She will call if any concerns after seeing her this am.

## 2016-05-11 NOTE — Telephone Encounter (Signed)
-----   Message from Gordy Levan, MD sent at 05/10/2016  1:06 PM EDT ----- Neutropenic on 6-2, held Rx 6-2, granix 6-2 and 6-3 To see LL with labs on 6-8  Family also was to check with either PCP or cardiology re metoprolol as BP low on 5-31  Please call on Mon 6-5. If any concerns should have cbc and vitals here. K.Curcio also has open afternoon spots  thanks

## 2016-05-12 DIAGNOSIS — C50919 Malignant neoplasm of unspecified site of unspecified female breast: Secondary | ICD-10-CM | POA: Diagnosis not present

## 2016-05-12 DIAGNOSIS — I69320 Aphasia following cerebral infarction: Secondary | ICD-10-CM | POA: Diagnosis not present

## 2016-05-12 DIAGNOSIS — K5900 Constipation, unspecified: Secondary | ICD-10-CM | POA: Diagnosis not present

## 2016-05-12 DIAGNOSIS — I69390 Apraxia following cerebral infarction: Secondary | ICD-10-CM | POA: Diagnosis not present

## 2016-05-12 DIAGNOSIS — I69351 Hemiplegia and hemiparesis following cerebral infarction affecting right dominant side: Secondary | ICD-10-CM | POA: Diagnosis not present

## 2016-05-12 DIAGNOSIS — F329 Major depressive disorder, single episode, unspecified: Secondary | ICD-10-CM | POA: Diagnosis not present

## 2016-05-13 DIAGNOSIS — I69351 Hemiplegia and hemiparesis following cerebral infarction affecting right dominant side: Secondary | ICD-10-CM | POA: Diagnosis not present

## 2016-05-13 DIAGNOSIS — I69390 Apraxia following cerebral infarction: Secondary | ICD-10-CM | POA: Diagnosis not present

## 2016-05-13 DIAGNOSIS — C50919 Malignant neoplasm of unspecified site of unspecified female breast: Secondary | ICD-10-CM | POA: Diagnosis not present

## 2016-05-13 DIAGNOSIS — I69320 Aphasia following cerebral infarction: Secondary | ICD-10-CM | POA: Diagnosis not present

## 2016-05-13 DIAGNOSIS — F329 Major depressive disorder, single episode, unspecified: Secondary | ICD-10-CM | POA: Diagnosis not present

## 2016-05-13 DIAGNOSIS — K5900 Constipation, unspecified: Secondary | ICD-10-CM | POA: Diagnosis not present

## 2016-05-14 ENCOUNTER — Telehealth: Payer: Self-pay | Admitting: Oncology

## 2016-05-14 ENCOUNTER — Encounter: Payer: Self-pay | Admitting: Oncology

## 2016-05-14 ENCOUNTER — Ambulatory Visit (HOSPITAL_BASED_OUTPATIENT_CLINIC_OR_DEPARTMENT_OTHER): Payer: Medicare Other | Admitting: Oncology

## 2016-05-14 ENCOUNTER — Other Ambulatory Visit (HOSPITAL_BASED_OUTPATIENT_CLINIC_OR_DEPARTMENT_OTHER): Payer: Medicare Other

## 2016-05-14 ENCOUNTER — Other Ambulatory Visit: Payer: Medicare Other

## 2016-05-14 ENCOUNTER — Ambulatory Visit: Payer: Medicare Other | Admitting: Oncology

## 2016-05-14 VITALS — BP 109/75 | HR 75 | Temp 97.7°F | Resp 18 | Ht 65.0 in | Wt 178.0 lb

## 2016-05-14 DIAGNOSIS — C562 Malignant neoplasm of left ovary: Secondary | ICD-10-CM

## 2016-05-14 DIAGNOSIS — I69320 Aphasia following cerebral infarction: Secondary | ICD-10-CM | POA: Diagnosis not present

## 2016-05-14 DIAGNOSIS — C563 Malignant neoplasm of bilateral ovaries: Secondary | ICD-10-CM

## 2016-05-14 DIAGNOSIS — Z7901 Long term (current) use of anticoagulants: Secondary | ICD-10-CM

## 2016-05-14 DIAGNOSIS — I69351 Hemiplegia and hemiparesis following cerebral infarction affecting right dominant side: Secondary | ICD-10-CM | POA: Diagnosis not present

## 2016-05-14 DIAGNOSIS — I69390 Apraxia following cerebral infarction: Secondary | ICD-10-CM | POA: Diagnosis not present

## 2016-05-14 DIAGNOSIS — D649 Anemia, unspecified: Secondary | ICD-10-CM | POA: Diagnosis not present

## 2016-05-14 DIAGNOSIS — G620 Drug-induced polyneuropathy: Secondary | ICD-10-CM

## 2016-05-14 DIAGNOSIS — I4891 Unspecified atrial fibrillation: Secondary | ICD-10-CM | POA: Diagnosis not present

## 2016-05-14 DIAGNOSIS — C561 Malignant neoplasm of right ovary: Secondary | ICD-10-CM

## 2016-05-14 DIAGNOSIS — G8191 Hemiplegia, unspecified affecting right dominant side: Secondary | ICD-10-CM

## 2016-05-14 DIAGNOSIS — D6481 Anemia due to antineoplastic chemotherapy: Secondary | ICD-10-CM

## 2016-05-14 DIAGNOSIS — T451X5A Adverse effect of antineoplastic and immunosuppressive drugs, initial encounter: Secondary | ICD-10-CM

## 2016-05-14 DIAGNOSIS — Z853 Personal history of malignant neoplasm of breast: Secondary | ICD-10-CM | POA: Diagnosis not present

## 2016-05-14 DIAGNOSIS — D701 Agranulocytosis secondary to cancer chemotherapy: Secondary | ICD-10-CM | POA: Diagnosis not present

## 2016-05-14 DIAGNOSIS — F329 Major depressive disorder, single episode, unspecified: Secondary | ICD-10-CM | POA: Diagnosis not present

## 2016-05-14 DIAGNOSIS — C50919 Malignant neoplasm of unspecified site of unspecified female breast: Secondary | ICD-10-CM | POA: Diagnosis not present

## 2016-05-14 DIAGNOSIS — I10 Essential (primary) hypertension: Secondary | ICD-10-CM

## 2016-05-14 DIAGNOSIS — K5900 Constipation, unspecified: Secondary | ICD-10-CM | POA: Diagnosis not present

## 2016-05-14 DIAGNOSIS — Z95828 Presence of other vascular implants and grafts: Secondary | ICD-10-CM

## 2016-05-14 DIAGNOSIS — C569 Malignant neoplasm of unspecified ovary: Secondary | ICD-10-CM

## 2016-05-14 LAB — COMPREHENSIVE METABOLIC PANEL
ALT: 22 U/L (ref 0–55)
AST: 16 U/L (ref 5–34)
Albumin: 2.9 g/dL — ABNORMAL LOW (ref 3.5–5.0)
Alkaline Phosphatase: 106 U/L (ref 40–150)
Anion Gap: 9 mEq/L (ref 3–11)
BUN: 9.1 mg/dL (ref 7.0–26.0)
CO2: 27 mEq/L (ref 22–29)
Calcium: 9.6 mg/dL (ref 8.4–10.4)
Chloride: 107 mEq/L (ref 98–109)
Creatinine: 0.8 mg/dL (ref 0.6–1.1)
EGFR: >90 mL/min/{1.73_m2} (ref >90)
Glucose: 94 mg/dl (ref 70–140)
Potassium: 3.9 mEq/L (ref 3.5–5.1)
Sodium: 142 mEq/L (ref 136–145)
Total Bilirubin: 0.3 mg/dL (ref 0.20–1.20)
Total Protein: 6.9 g/dL (ref 6.4–8.3)

## 2016-05-14 LAB — CBC WITH DIFFERENTIAL/PLATELET
BASO%: 0.2 % (ref 0.0–2.0)
Basophils Absolute: 0 10*3/uL (ref 0.0–0.1)
EOS%: 0.3 % (ref 0.0–7.0)
Eosinophils Absolute: 0 10*3/uL (ref 0.0–0.5)
HCT: 31.3 % — ABNORMAL LOW (ref 34.8–46.6)
HGB: 10.4 g/dL — ABNORMAL LOW (ref 11.6–15.9)
LYMPH%: 24.7 % (ref 14.0–49.7)
MCH: 33.7 pg (ref 25.1–34.0)
MCHC: 33.2 g/dL (ref 31.5–36.0)
MCV: 101.3 fL — ABNORMAL HIGH (ref 79.5–101.0)
MONO#: 2 10*3/uL — ABNORMAL HIGH (ref 0.1–0.9)
MONO%: 16.2 % — ABNORMAL HIGH (ref 0.0–14.0)
NEUT#: 7.1 10*3/uL — ABNORMAL HIGH (ref 1.5–6.5)
NEUT%: 58.6 % (ref 38.4–76.8)
Platelets: 258 10*3/uL (ref 145–400)
RBC: 3.09 10*6/uL — ABNORMAL LOW (ref 3.70–5.45)
RDW: 14.7 % — ABNORMAL HIGH (ref 11.2–14.5)
WBC: 12.1 10*3/uL — ABNORMAL HIGH (ref 3.9–10.3)
lymph#: 3 10*3/uL (ref 0.9–3.3)

## 2016-05-14 NOTE — Progress Notes (Signed)
OFFICE PROGRESS NOTE   May 14, 2016   Physicians: Everitt Amber, Lendon Collar (PCP), Lindell Spar [phone] neurology [place]), Dr Valetta Fuller (cardiology [place], [phone], new patient apt 01-15-16) Carol Ada (Alapati P. Marcello Moores, PCP [place], [place] [phone]) ( (oncologist Dr Maudie Mercury in [place], [place]; breast radiation given at [facility] in [place]). 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 Day 15 cycle 4 05-08-16 was held with ANC 0.4, recovered now with additional granix. She will have day 1 cycle 5 on 05-15-16. Doses have been reduced for cycle 5 carbo and she will receive granix on days 2,3,9,16. We plan to give 6 cycles of chemo total.  Patient was seen in ED on 05-01-16 for reported fever, afebrile there and blood cultures negative, cipro x1 given in ED.  Patient has had no fever in past week, and no symptoms of infection. Appetite is not great but she is eating and trying to drink fluids, no nausea. She complains of difficulty picking up pills with left hand, but is able to do other fine motor activities with that hand; she denies increased numbness right hand, right foot or left foot. She has no new or different neurologic symptoms otherwise. Home PT continues, has been helping strength and mobility, with patient able to walk outside with assistance yesterday. Bowels are moving, no SOB with present activity, no problems with PAC, no bleeding, no LE swelling, no abdominal or pelvic pain. Blood pressure was lower on 05-06-16 per [facility], which they discussed with PCP, cardiology appointment scheduled 05-20-16.  Son feels she has been doing well last several days. Remainder of 10 point Review of Systems negative/ unchanged.  CA 125 12-24-15 by [employer]  ECLIA 608, and on 01-01-16 741 PAC in Flu vaccine 10-10-15 No genetics testing known with prior breast cancer; with diagnosis now known ovarian, genetics testing would be appropriate.    ONCOLOGIC HISTORY Patient presented to ED in [place] 11-28-15 with LLQ pain and left hip pain, symptomatic for at least a year per family but worse. CT AP 11-28-15 had bilateral ovarian masses up to 4 cm on right, with peritoneal carcinomatosis in pelvis, omentum and perihepatic, with small ascites, bilateral pelvic and inguinal adenopathy, with retroperitoneal adenopathy extending to pelvic brim. 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 Recommendation was to begin treatment with carboplatin taxol, with reevaluation after 3 cycles for possible interval debulking surgery. Day 1 cycle 1 dose dense carbo taxol was given on 12-27-15, then missed day 8 cycle 1 due to lack of IV access. ANC was 0.1 on day 14 (01-09-16) with granix given x3 and day 15 delayed another week. She received day 15 cycle 1 on 01-17-16 after PICC placed that day. She completed cycle 3 on 02-28-16. CT CAP 03-02-16 showed resolution of ascites and significant improvement in peritoneal disease and adenopathy. She had interval robotic TAH BSO omentectoy and right inguinal lymphadenectomy on 03-24-16, which was R0 debulking. Intraoperatively there appeared to be minimal residual peritoneal and omental disease, with enlarged right iliac node.  Pathology [phone] high grade serous carcinoma primary left ovary where residual focal involvement with extensive fibrosis, and focal involvement of omentum also with extensive fibrosis, external iliac node and  peritoneum.  She has history of left breast cancer 2005 treated in [place] with lumpectomy, chemotherapy and radiation, none of those records have been obtained.   Objective:  Vital signs in last 24 hours:  BP 109/75 mmHg  Pulse 75  Temp(Src) 97.7 F (36.5 C) (Oral)  Resp 18  Ht 5' 5"  (1.651 m)  Wt 178 lb (80.74 kg)  BMI 29.62 kg/m2  SpO2 100% Weight up 2 lbs. Alert, oriented and appropriate. Using WC for office. Respirations not labored. Looks comfortable. Speech more fluent at times.   HEENT:PERRL, sclerae not icteric. Oral mucosa moist without lesions, posterior pharynx clear.  Neck supple. No JVD.  Lymphatics:no supraclavicular adenopathy Resp: clear to auscultation bilaterally and normal percussion bilaterally Cardio: regular rate and rhythm. No gallop. GI: soft, nontender, not distended, no mass or organomegaly. Normally active bowel sounds.  Musculoskeletal/ Extremities: extremities without pitting edema, cords, tenderness. Not tender now anterior chest. Neuro:  peripheral neuropathy not clearly worse. Right hemiplegia. Expressive speech difficulty Skin without rash, ecchymosis, petechiae Portacath-without erythema or tenderness  Lab Results:  Results for orders placed or performed in visit on 05/14/16  CBC with Differential  Result Value Ref Range   WBC 12.1 (H) 3.9 - 10.3 10e3/uL   NEUT# 7.1 (H) 1.5 - 6.5 10e3/uL   HGB 10.4 (L) 11.6 - 15.9 g/dL   HCT 31.3 (L) 34.8 - 46.6 %   Platelets 258 145 - 400 10e3/uL   MCV 101.3 (H) 79.5 - 101.0 fL   MCH 33.7 25.1 - 34.0 pg   MCHC 33.2 31.5 - 36.0 g/dL   RBC 3.09 (L) 3.70 - 5.45 10e6/uL   RDW 14.7 (H) 11.2 - 14.5 %   lymph# 3.0 0.9 - 3.3 10e3/uL   MONO# 2.0 (H) 0.1 - 0.9 10e3/uL   Eosinophils Absolute 0.0 0.0 - 0.5 10e3/uL   Basophils Absolute 0.0 0.0 - 0.1 10e3/uL   NEUT% 58.6 38.4 - 76.8 %   LYMPH% 24.7 14.0 - 49.7 %   MONO% 16.2 (H) 0.0 - 14.0 %   EOS% 0.3 0.0 - 7.0 %   BASO% 0.2 0.0 - 2.0 %  Comprehensive  metabolic panel  Result Value Ref Range   Sodium 142 136 - 145 mEq/L   Potassium 3.9 3.5 - 5.1 mEq/L   Chloride 107 98 - 109 mEq/L   CO2 27 22 - 29 mEq/L   Glucose 94 70 - 140 mg/dl   BUN 9.1 7.0 - 26.0 mg/dL   Creatinine 0.8 0.6 - 1.1 mg/dL   Total Bilirubin 0.30 0.20 - 1.20 mg/dL   Alkaline Phosphatase 106 40 - 150 U/L   AST 16 5 - 34 U/L   ALT 22 0 - 55 U/L   Total Protein 6.9 6.4 - 8.3 g/dL   Albumin 2.9 (L) 3.5 - 5.0 g/dL   Calcium 9.6 8.4 - 10.4 mg/dL   Anion Gap 9 3 - 11 mEq/L   EGFR >90 >90 ml/min/1.73 m2   CA 125 available after visit down to 22.5  Studies/Results:  No results found.  Medications: I have reviewed the patient's current medications. Carbo AUC decreased from 5 to 4. Granix is dosed at 480 mcg.  DISCUSSION Interval history reviewed, possibly the events around ED visit could have been related to granix injections (5-20 and 5-22).  Discussed peripheral  neuropathy related to taxol, tho potential benefit from this drug against the malignancy seems to outweigh present peripheral neuropathy. Will continue to follow closely, may need to discontinue taxol if significant interference of function left hand or foot. Carbo dose reduced for cycle 5 from AUC 5 to AUC 4 due to cytopenias despite gCSF. Encouraged her to continue PT and good nutrition   Assessment/Plan:   1.IVB high grade serous carcinoma of left ovary, treated to this point with 3 cycles of neoadjuvant dose dense carbo taxol, interval R0 debulking, and continuing chemo planned thru total 6 cycles. Day 1 cycle 5 05-15-16 with carbo dose reduction, day 8 cycle 5 on 6-16 as long as ANC >=1.5 and plt >=100k, and day 15 cycle 5 on 6-23 with same parameters.  GCSF.days 2,3,9,16.  2.Chemo peripheral neuropathy more difficult to assess with right CVA and communication difficulty. Will be very aware of this ongoing, hold taxol if concerns. Needs genetics referral, will follow up by phone. 2.  Chemo neutropenia: ANC  0.4 day 15 cycle 4 despite gCSF. Carbo dose decreased, continue granix.  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 Has resumed outpatient PT/ OT/ speech therapy by [facility], helping. Followed by neurology in [facility]. History of TIAs 2010.  4.atrial fibrillation diagnosed ~ 2011, now on Eliquis; ASA not listed on meds. Now followed by Dr Valetta Fuller [facility], next visit  05-20-16 re BP meds  No problems with chemo thrombocytopenia thus far, but need to be aware of anticoagulation if so. 5.Left breast cancer 2005 diagnosed and treated in [place] with lumpectomy and presumably axillary node evaluation, combination chemotherapy (drugs not known) via peripheral veins, local radiation. Up to date on mammograms, done 10[facility]18-15 at [facility].  6.hx HTN 7.Constipation: Resolved with increased laxatives. Colon polyps at colonoscopy by Dr Benson Norway 2015. Follow up not specified on the report in EMR 8.post appendectomy, BTL 9.family history of cardiac disease with MIs in sisters ages 69 and 84, and in brother 87.information on Advance Directives given 11.PAC in, very difficult peripheral IV access so best to leave this after chemo completes in case needed for additonal treatment, at least next 6-12 months. WIll need to flush when not used. 12.anemia: mild and stable on ferrous fumarate now. She has not been transfused. 13.acute costochondritis by exam 5-25, also may have been problem at time of ED visit.   All questions answered, son and patient in agreement with recommendations and plans. Chemo orders adjusted, granix orders confirmed. Time spent 30 min including >50% counseling and coordination of care. Route PCP, cc Dr Prince Rome for visit 6-14    Gordy Levan, MD   05/14/2016, 12:04 PM

## 2016-05-14 NOTE — Telephone Encounter (Signed)
appt made and avs printed °

## 2016-05-15 ENCOUNTER — Ambulatory Visit (HOSPITAL_BASED_OUTPATIENT_CLINIC_OR_DEPARTMENT_OTHER): Payer: Medicare Other

## 2016-05-15 VITALS — BP 117/78 | HR 82 | Temp 98.9°F | Resp 18

## 2016-05-15 DIAGNOSIS — C562 Malignant neoplasm of left ovary: Secondary | ICD-10-CM | POA: Diagnosis not present

## 2016-05-15 DIAGNOSIS — C561 Malignant neoplasm of right ovary: Secondary | ICD-10-CM | POA: Diagnosis not present

## 2016-05-15 DIAGNOSIS — C563 Malignant neoplasm of bilateral ovaries: Secondary | ICD-10-CM

## 2016-05-15 DIAGNOSIS — Z5111 Encounter for antineoplastic chemotherapy: Secondary | ICD-10-CM

## 2016-05-15 LAB — CA 125: Cancer Antigen (CA) 125: 22.5 U/mL (ref 0.0–38.1)

## 2016-05-15 MED ORDER — SODIUM CHLORIDE 0.9 % IJ SOLN
10.0000 mL | INTRAMUSCULAR | Status: DC | PRN
  Filled 2016-05-15: qty 10

## 2016-05-15 MED ORDER — DIPHENHYDRAMINE HCL 50 MG/ML IJ SOLN
INTRAMUSCULAR | Status: AC
  Filled 2016-05-15: qty 1

## 2016-05-15 MED ORDER — SODIUM CHLORIDE 0.9 % IV SOLN
20.0000 mg | Freq: Once | INTRAVENOUS | Status: AC
  Administered 2016-05-15: 20 mg via INTRAVENOUS
  Filled 2016-05-15: qty 2

## 2016-05-15 MED ORDER — PALONOSETRON HCL INJECTION 0.25 MG/5ML
INTRAVENOUS | Status: AC
  Filled 2016-05-15: qty 5

## 2016-05-15 MED ORDER — SODIUM CHLORIDE 0.9 % IV SOLN
407.2000 mg | Freq: Once | INTRAVENOUS | Status: AC
  Administered 2016-05-15: 410 mg via INTRAVENOUS
  Filled 2016-05-15: qty 41

## 2016-05-15 MED ORDER — SODIUM CHLORIDE 0.9 % IV SOLN
80.0000 mg/m2 | Freq: Once | INTRAVENOUS | Status: AC
  Administered 2016-05-15: 162 mg via INTRAVENOUS
  Filled 2016-05-15: qty 27

## 2016-05-15 MED ORDER — FAMOTIDINE IN NACL 20-0.9 MG/50ML-% IV SOLN
20.0000 mg | Freq: Once | INTRAVENOUS | Status: AC
  Administered 2016-05-15: 20 mg via INTRAVENOUS

## 2016-05-15 MED ORDER — SODIUM CHLORIDE 0.9 % IV SOLN
Freq: Once | INTRAVENOUS | Status: AC
  Administered 2016-05-15: 14:00:00 via INTRAVENOUS

## 2016-05-15 MED ORDER — HEPARIN SOD (PORK) LOCK FLUSH 100 UNIT/ML IV SOLN
500.0000 [IU] | Freq: Once | INTRAVENOUS | Status: AC | PRN
  Administered 2016-05-15: 500 [IU]
  Filled 2016-05-15: qty 5

## 2016-05-15 MED ORDER — DIPHENHYDRAMINE HCL 50 MG/ML IJ SOLN
25.0000 mg | Freq: Once | INTRAMUSCULAR | Status: AC
  Administered 2016-05-15: 25 mg via INTRAVENOUS

## 2016-05-15 MED ORDER — PALONOSETRON HCL INJECTION 0.25 MG/5ML
0.2500 mg | Freq: Once | INTRAVENOUS | Status: AC
  Administered 2016-05-15: 0.25 mg via INTRAVENOUS

## 2016-05-15 MED ORDER — FAMOTIDINE IN NACL 20-0.9 MG/50ML-% IV SOLN
INTRAVENOUS | Status: AC
  Filled 2016-05-15: qty 50

## 2016-05-15 NOTE — Patient Instructions (Signed)
[facility] Discharge Instructions for Patients Receiving Chemotherapy  Today you received the following chemotherapy agents Paclitaxel/Carboplatin.   To help prevent nausea and vomiting after your treatment, we encourage you to take your nausea medication as directed.    If you develop nausea and vomiting that is not controlled by your nausea medication, call the clinic.   BELOW ARE SYMPTOMS THAT SHOULD BE REPORTED IMMEDIATELY:  *FEVER GREATER THAN 100.5 F  *CHILLS WITH OR WITHOUT FEVER  NAUSEA AND VOMITING THAT IS NOT CONTROLLED WITH YOUR NAUSEA MEDICATION  *UNUSUAL SHORTNESS OF BREATH  *UNUSUAL BRUISING OR BLEEDING  TENDERNESS IN MOUTH AND THROAT WITH OR WITHOUT PRESENCE OF ULCERS  *URINARY PROBLEMS  *BOWEL PROBLEMS  UNUSUAL RASH Items with * indicate a potential emergency and should be followed up as soon as possible.  Feel free to call the clinic you have any questions or concerns. The clinic phone number is [phone].  Please show the CHEMO ALERT CARD at check-in to the Emergency Department and triage nurse.   

## 2016-05-16 ENCOUNTER — Telehealth: Payer: Self-pay | Admitting: Oncology

## 2016-05-16 ENCOUNTER — Other Ambulatory Visit: Payer: Self-pay | Admitting: Oncology

## 2016-05-16 ENCOUNTER — Ambulatory Visit (HOSPITAL_BASED_OUTPATIENT_CLINIC_OR_DEPARTMENT_OTHER): Payer: Medicare Other

## 2016-05-16 VITALS — BP 115/86 | HR 91 | Temp 97.2°F | Resp 20

## 2016-05-16 DIAGNOSIS — C563 Malignant neoplasm of bilateral ovaries: Secondary | ICD-10-CM

## 2016-05-16 DIAGNOSIS — D701 Agranulocytosis secondary to cancer chemotherapy: Secondary | ICD-10-CM | POA: Diagnosis not present

## 2016-05-16 DIAGNOSIS — C561 Malignant neoplasm of right ovary: Secondary | ICD-10-CM | POA: Diagnosis present

## 2016-05-16 DIAGNOSIS — C562 Malignant neoplasm of left ovary: Secondary | ICD-10-CM

## 2016-05-16 MED ORDER — TBO-FILGRASTIM 480 MCG/0.8ML ~~LOC~~ SOSY
480.0000 ug | PREFILLED_SYRINGE | Freq: Once | SUBCUTANEOUS | Status: AC
  Administered 2016-05-16: 480 ug via SUBCUTANEOUS

## 2016-05-16 NOTE — Telephone Encounter (Signed)
Medical Oncology  MD noted now that scheduling was not done for injection appointment today, 05-16-16, that POF placed on 05-14-16. Spoke with [place] infusion room, with nursing available until 1400 today. Spoke with patient's husband by phone. Patient doing ok since chemo on 6-10, but not up or dressed yet. He will call family member to bring her to [facility] for injection, which I asked him to try to do no later than 1300. He will let us know if not able to do this.  I apologized for scheduling problem.  Per husband, son had asked about injections at time of patient's chemo on 05-15-16.  Granix orders in place already. Message to scheduling now to be sure she is set up and family aware of injection also on 05-18-16, as ordered.   Godfrey Pick, MD

## 2016-05-16 NOTE — Patient Instructions (Signed)

## 2016-05-18 ENCOUNTER — Telehealth: Payer: Self-pay

## 2016-05-18 ENCOUNTER — Ambulatory Visit (HOSPITAL_BASED_OUTPATIENT_CLINIC_OR_DEPARTMENT_OTHER): Payer: Medicare Other

## 2016-05-18 VITALS — BP 108/73 | HR 89 | Temp 98.1°F | Resp 18

## 2016-05-18 DIAGNOSIS — C562 Malignant neoplasm of left ovary: Secondary | ICD-10-CM | POA: Diagnosis not present

## 2016-05-18 DIAGNOSIS — Z5189 Encounter for other specified aftercare: Secondary | ICD-10-CM

## 2016-05-18 DIAGNOSIS — C561 Malignant neoplasm of right ovary: Secondary | ICD-10-CM

## 2016-05-18 DIAGNOSIS — C563 Malignant neoplasm of bilateral ovaries: Secondary | ICD-10-CM

## 2016-05-18 MED ORDER — TBO-FILGRASTIM 480 MCG/0.8ML ~~LOC~~ SOSY
480.0000 ug | PREFILLED_SYRINGE | Freq: Once | SUBCUTANEOUS | Status: AC
  Administered 2016-05-18: 480 ug via SUBCUTANEOUS
  Filled 2016-05-18: qty 0.8

## 2016-05-18 NOTE — Telephone Encounter (Signed)
-----   Message from Gordy Levan, MD sent at 05/16/2016 10:50 AM EDT ----- Please let son or daughter know that I would like her to be seen by genetics counselor sometime in next 1-2 months, as genetics information may give [place] other options for treatment after this chemo, may direct [place] for other appropriate follow up in her, and may give family additional useful information.  I meant to address this in last couple of visits, but did not do it. I am happy to talk with them at next visit if they have questions about this recommendation. If ok with them, please put in order for genetics counseling to coordinate with one of her other visits here in ~ next 1-2 months thanks

## 2016-05-18 NOTE — Telephone Encounter (Signed)
S/w tonya re genetics counseling. She was willing to go ahead with this. POF sent. reitterated today's injection appt.

## 2016-05-18 NOTE — Patient Instructions (Signed)

## 2016-05-19 DIAGNOSIS — I69320 Aphasia following cerebral infarction: Secondary | ICD-10-CM | POA: Diagnosis not present

## 2016-05-19 DIAGNOSIS — I69390 Apraxia following cerebral infarction: Secondary | ICD-10-CM | POA: Diagnosis not present

## 2016-05-19 DIAGNOSIS — I69351 Hemiplegia and hemiparesis following cerebral infarction affecting right dominant side: Secondary | ICD-10-CM | POA: Diagnosis not present

## 2016-05-19 DIAGNOSIS — K5900 Constipation, unspecified: Secondary | ICD-10-CM | POA: Diagnosis not present

## 2016-05-19 DIAGNOSIS — F329 Major depressive disorder, single episode, unspecified: Secondary | ICD-10-CM | POA: Diagnosis not present

## 2016-05-19 DIAGNOSIS — C50919 Malignant neoplasm of unspecified site of unspecified female breast: Secondary | ICD-10-CM | POA: Diagnosis not present

## 2016-05-20 DIAGNOSIS — I63512 Cerebral infarction due to unspecified occlusion or stenosis of left middle cerebral artery: Secondary | ICD-10-CM | POA: Diagnosis not present

## 2016-05-20 DIAGNOSIS — I639 Cerebral infarction, unspecified: Secondary | ICD-10-CM | POA: Diagnosis not present

## 2016-05-20 DIAGNOSIS — I482 Chronic atrial fibrillation: Secondary | ICD-10-CM | POA: Diagnosis not present

## 2016-05-21 ENCOUNTER — Telehealth: Payer: Self-pay | Admitting: *Deleted

## 2016-05-21 DIAGNOSIS — F329 Major depressive disorder, single episode, unspecified: Secondary | ICD-10-CM | POA: Diagnosis not present

## 2016-05-21 DIAGNOSIS — I69390 Apraxia following cerebral infarction: Secondary | ICD-10-CM | POA: Diagnosis not present

## 2016-05-21 DIAGNOSIS — K5900 Constipation, unspecified: Secondary | ICD-10-CM | POA: Diagnosis not present

## 2016-05-21 DIAGNOSIS — I69351 Hemiplegia and hemiparesis following cerebral infarction affecting right dominant side: Secondary | ICD-10-CM | POA: Diagnosis not present

## 2016-05-21 DIAGNOSIS — I69320 Aphasia following cerebral infarction: Secondary | ICD-10-CM | POA: Diagnosis not present

## 2016-05-21 DIAGNOSIS — C50919 Malignant neoplasm of unspecified site of unspecified female breast: Secondary | ICD-10-CM | POA: Diagnosis not present

## 2016-05-21 NOTE — Telephone Encounter (Signed)
Brenda Cunningham with [facility] needs orders for speech therapy for expressive language twice a week for 4 weeks. Her number is [phone].

## 2016-05-22 ENCOUNTER — Other Ambulatory Visit (HOSPITAL_BASED_OUTPATIENT_CLINIC_OR_DEPARTMENT_OTHER): Payer: Medicare Other

## 2016-05-22 ENCOUNTER — Ambulatory Visit (HOSPITAL_BASED_OUTPATIENT_CLINIC_OR_DEPARTMENT_OTHER): Payer: Medicare Other

## 2016-05-22 ENCOUNTER — Other Ambulatory Visit: Payer: Self-pay | Admitting: Oncology

## 2016-05-22 VITALS — BP 112/65 | HR 86 | Temp 97.8°F | Resp 18

## 2016-05-22 DIAGNOSIS — Z5111 Encounter for antineoplastic chemotherapy: Secondary | ICD-10-CM | POA: Diagnosis present

## 2016-05-22 DIAGNOSIS — C562 Malignant neoplasm of left ovary: Secondary | ICD-10-CM

## 2016-05-22 DIAGNOSIS — C561 Malignant neoplasm of right ovary: Secondary | ICD-10-CM

## 2016-05-22 DIAGNOSIS — C563 Malignant neoplasm of bilateral ovaries: Secondary | ICD-10-CM

## 2016-05-22 DIAGNOSIS — Z95828 Presence of other vascular implants and grafts: Secondary | ICD-10-CM

## 2016-05-22 LAB — CBC WITH DIFFERENTIAL/PLATELET
BASO%: 0.2 % (ref 0.0–2.0)
Basophils Absolute: 0 10*3/uL (ref 0.0–0.1)
EOS%: 0 % (ref 0.0–7.0)
Eosinophils Absolute: 0 10*3/uL (ref 0.0–0.5)
HCT: 33 % — ABNORMAL LOW (ref 34.8–46.6)
HGB: 10.7 g/dL — ABNORMAL LOW (ref 11.6–15.9)
LYMPH%: 11.3 % — ABNORMAL LOW (ref 14.0–49.7)
MCH: 32.9 pg (ref 25.1–34.0)
MCHC: 32.5 g/dL (ref 31.5–36.0)
MCV: 101.3 fL — ABNORMAL HIGH (ref 79.5–101.0)
MONO#: 0 10*3/uL — ABNORMAL LOW (ref 0.1–0.9)
MONO%: 0.4 % (ref 0.0–14.0)
NEUT#: 3.8 10*3/uL (ref 1.5–6.5)
NEUT%: 88.1 % — ABNORMAL HIGH (ref 38.4–76.8)
Platelets: 250 10*3/uL (ref 145–400)
RBC: 3.26 10*6/uL — ABNORMAL LOW (ref 3.70–5.45)
RDW: 15.3 % — ABNORMAL HIGH (ref 11.2–14.5)
WBC: 4.3 10*3/uL (ref 3.9–10.3)
lymph#: 0.5 10*3/uL — ABNORMAL LOW (ref 0.9–3.3)

## 2016-05-22 LAB — COMPREHENSIVE METABOLIC PANEL
ALT: 36 U/L (ref 0–55)
AST: 18 U/L (ref 5–34)
Albumin: 3.2 g/dL — ABNORMAL LOW (ref 3.5–5.0)
Alkaline Phosphatase: 107 U/L (ref 40–150)
Anion Gap: 10 mEq/L (ref 3–11)
BUN: 18.4 mg/dL (ref 7.0–26.0)
CO2: 24 mEq/L (ref 22–29)
Calcium: 9.8 mg/dL (ref 8.4–10.4)
Chloride: 105 mEq/L (ref 98–109)
Creatinine: 0.8 mg/dL (ref 0.6–1.1)
EGFR: 90 mL/min/{1.73_m2} — ABNORMAL LOW (ref >90)
Glucose: 177 mg/dl — ABNORMAL HIGH (ref 70–140)
Potassium: 4.3 mEq/L (ref 3.5–5.1)
Sodium: 139 mEq/L (ref 136–145)
Total Bilirubin: 0.62 mg/dL (ref 0.20–1.20)
Total Protein: 7 g/dL (ref 6.4–8.3)

## 2016-05-22 MED ORDER — ONDANSETRON 8 MG PO TBDP: 8.0000 mg | ORAL_TABLET | Freq: Three times a day (TID) | ORAL | Status: DC | PRN

## 2016-05-22 MED ORDER — SODIUM CHLORIDE 0.9 % IV SOLN
Freq: Once | INTRAVENOUS | Status: AC
  Administered 2016-05-22: 11:00:00 via INTRAVENOUS

## 2016-05-22 MED ORDER — FAMOTIDINE IN NACL 20-0.9 MG/50ML-% IV SOLN
INTRAVENOUS | Status: AC
  Filled 2016-05-22: qty 50

## 2016-05-22 MED ORDER — HEPARIN SOD (PORK) LOCK FLUSH 100 UNIT/ML IV SOLN
500.0000 [IU] | Freq: Once | INTRAVENOUS | Status: AC | PRN
  Administered 2016-05-22: 500 [IU] via INTRAVENOUS
  Filled 2016-05-22: qty 5

## 2016-05-22 MED ORDER — SODIUM CHLORIDE 0.9 % IJ SOLN
10.0000 mL | INTRAMUSCULAR | Status: DC | PRN
  Administered 2016-05-22: 10 mL via INTRAVENOUS
  Filled 2016-05-22: qty 10

## 2016-05-22 MED ORDER — DIPHENHYDRAMINE HCL 50 MG/ML IJ SOLN
INTRAMUSCULAR | Status: AC
  Filled 2016-05-22: qty 1

## 2016-05-22 MED ORDER — SODIUM CHLORIDE 0.9 % IV SOLN
Freq: Once | INTRAVENOUS | Status: AC
  Administered 2016-05-22: 11:00:00 via INTRAVENOUS
  Filled 2016-05-22: qty 4

## 2016-05-22 MED ORDER — FAMOTIDINE IN NACL 20-0.9 MG/50ML-% IV SOLN
20.0000 mg | Freq: Once | INTRAVENOUS | Status: AC
  Administered 2016-05-22: 20 mg via INTRAVENOUS

## 2016-05-22 MED ORDER — DIPHENHYDRAMINE HCL 50 MG/ML IJ SOLN
25.0000 mg | Freq: Once | INTRAMUSCULAR | Status: AC
  Administered 2016-05-22: 25 mg via INTRAVENOUS

## 2016-05-22 MED ORDER — SODIUM CHLORIDE 0.9 % IV SOLN
80.0000 mg/m2 | Freq: Once | INTRAVENOUS | Status: AC
  Administered 2016-05-22: 162 mg via INTRAVENOUS
  Filled 2016-05-22: qty 27

## 2016-05-22 NOTE — Telephone Encounter (Signed)
LM for Brenda Cunningham stating that Dr. Marko Plume is fine with order for speech pathology and noted below.  She can call Dr. Mariana Kaufman office if she has any questions at [phone].

## 2016-05-22 NOTE — Patient Instructions (Signed)
[place] Discharge Instructions for Patients Receiving Chemotherapy  Today you received the following chemotherapy agents: Taxol    To help prevent nausea and vomiting after your treatment, we encourage you to take your nausea medication as presecribed by MD. If you develop nausea and vomiting that is not controlled by your nausea medication, call the clinic.   BELOW ARE SYMPTOMS THAT SHOULD BE REPORTED IMMEDIATELY:  *FEVER GREATER THAN 100.5 F  *CHILLS WITH OR WITHOUT FEVER  NAUSEA AND VOMITING THAT IS NOT CONTROLLED WITH YOUR NAUSEA MEDICATION  *UNUSUAL SHORTNESS OF BREATH  *UNUSUAL BRUISING OR BLEEDING  TENDERNESS IN MOUTH AND THROAT WITH OR WITHOUT PRESENCE OF ULCERS  *URINARY PROBLEMS  *BOWEL PROBLEMS  UNUSUAL RASH Items with * indicate a potential emergency and should be followed up as soon as possible.  Feel free to call the clinic you have any questions or concerns. The clinic phone number is [phone].  Please show the [place] at check-in to the Emergency Department and triage nurse.

## 2016-05-22 NOTE — Telephone Encounter (Signed)
Fine for speech path orders thanks

## 2016-05-23 ENCOUNTER — Ambulatory Visit (HOSPITAL_BASED_OUTPATIENT_CLINIC_OR_DEPARTMENT_OTHER): Payer: Medicare Other

## 2016-05-23 VITALS — BP 99/69 | HR 102 | Temp 99.1°F

## 2016-05-23 DIAGNOSIS — C562 Malignant neoplasm of left ovary: Secondary | ICD-10-CM

## 2016-05-23 DIAGNOSIS — C561 Malignant neoplasm of right ovary: Secondary | ICD-10-CM

## 2016-05-23 DIAGNOSIS — C563 Malignant neoplasm of bilateral ovaries: Secondary | ICD-10-CM

## 2016-05-23 DIAGNOSIS — Z5189 Encounter for other specified aftercare: Secondary | ICD-10-CM

## 2016-05-23 MED ORDER — TBO-FILGRASTIM 480 MCG/0.8ML ~~LOC~~ SOSY
480.0000 ug | PREFILLED_SYRINGE | Freq: Once | SUBCUTANEOUS | Status: AC
  Administered 2016-05-23: 480 ug via SUBCUTANEOUS

## 2016-05-24 ENCOUNTER — Other Ambulatory Visit: Payer: Self-pay | Admitting: Oncology

## 2016-05-24 IMAGING — CT CT ANGIO CHEST
3 of 7 series · 18 of 36 positions shown · IV contrast (OMNIPAQUE 350)
Comparison: March 02, 2016

CLINICAL DATA: Pain and shortness of breath.

EXAM:
CT ANGIOGRAPHY CHEST WITH CONTRAST
TECHNIQUE: Multidetector CT imaging of the chest was performed using the
standard protocol during bolus administration of intravenous
contrast. Multiplanar CT image reconstructions and MIPs were
obtained to evaluate the vascular anatomy.
CONTRAST:  100 mL of Isovue 370

[Series 8: thins for pacs · axial · 0.71mm/px · z∈[-279,-32]mm · 15 of 283 slices shown]
[im 18/283  lung]
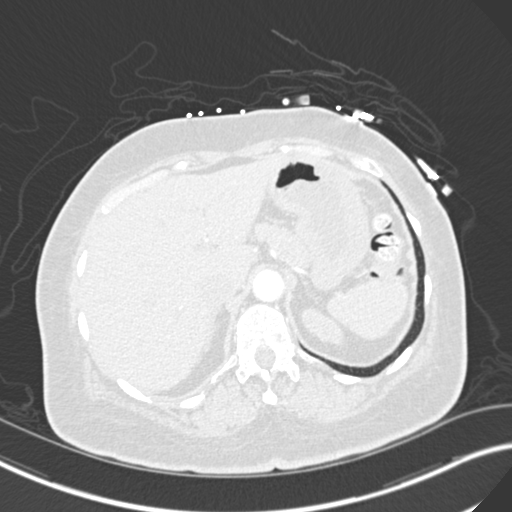
[im 36/283  mediastinal]
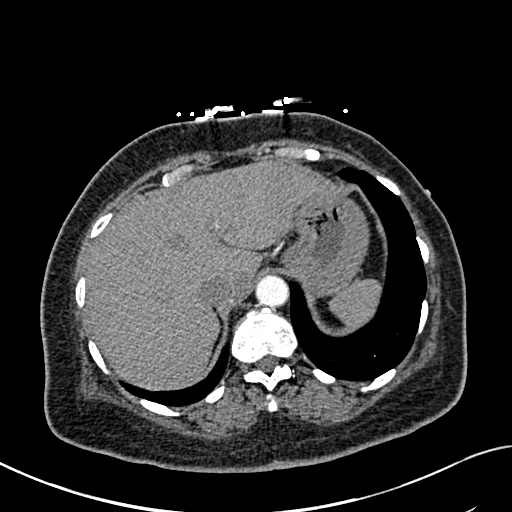
[im 53/283  lung]
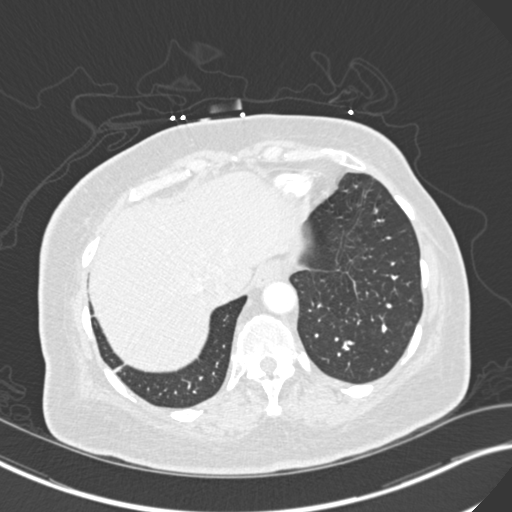
[im 71/283  mediastinal]
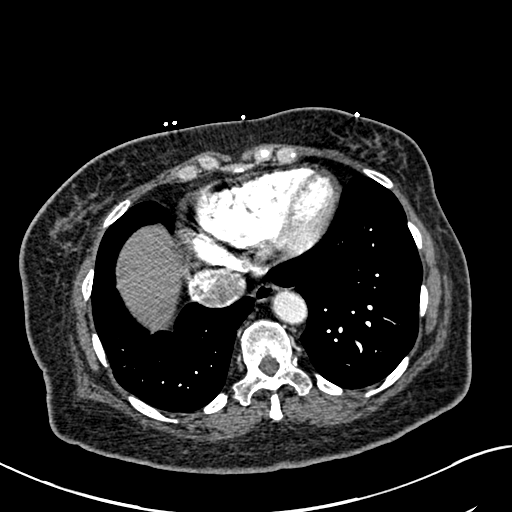
[im 89/283  lung]
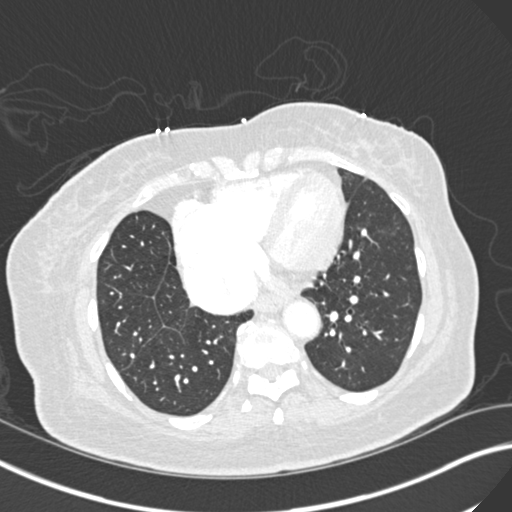
[im 106/283  mediastinal]
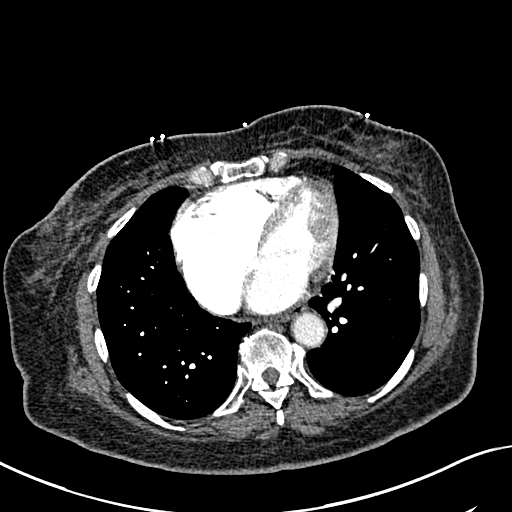
[im 124/283  lung]
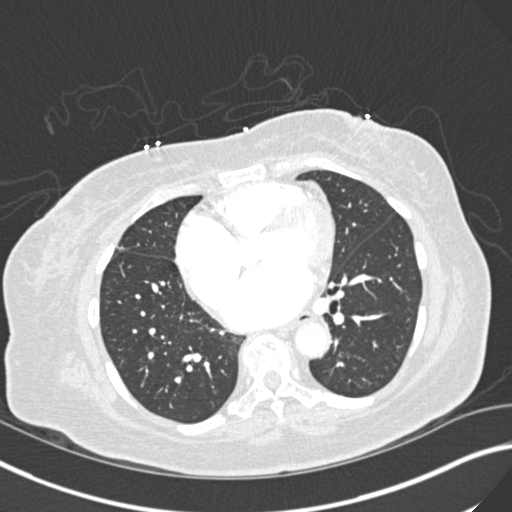
[im 142/283  mediastinal]
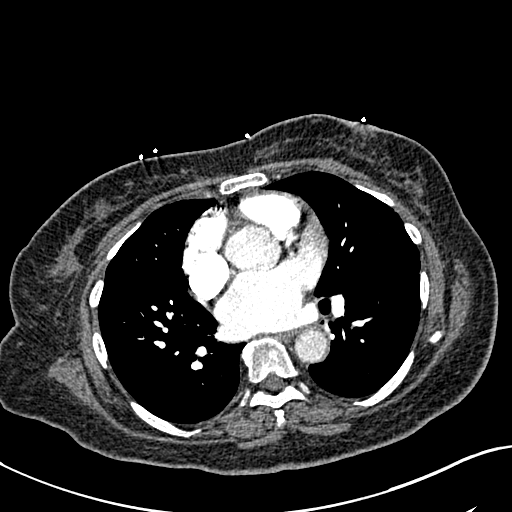
[im 159/283  lung]
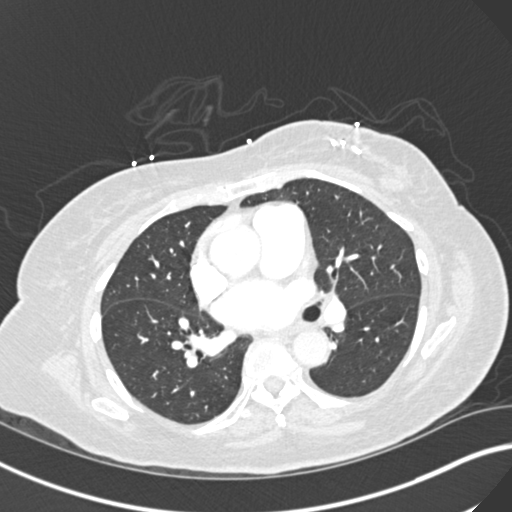
[im 177/283  mediastinal]
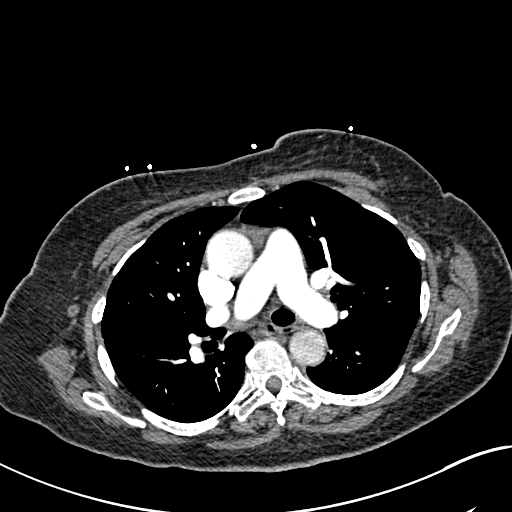
[im 194/283  lung]
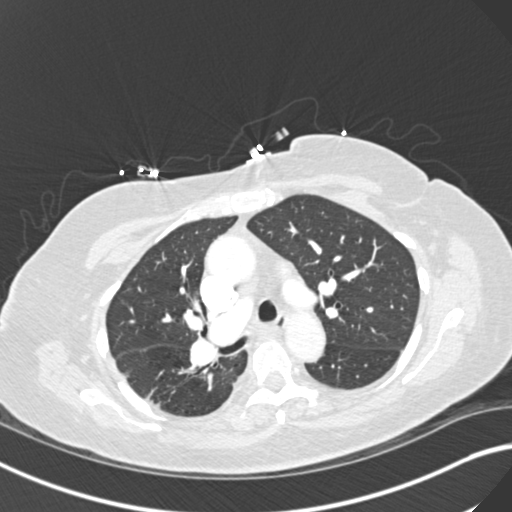
[im 212/283  mediastinal]
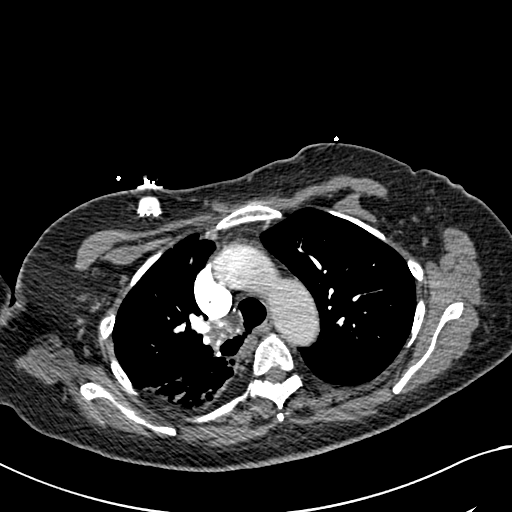
[im 230/283  lung]
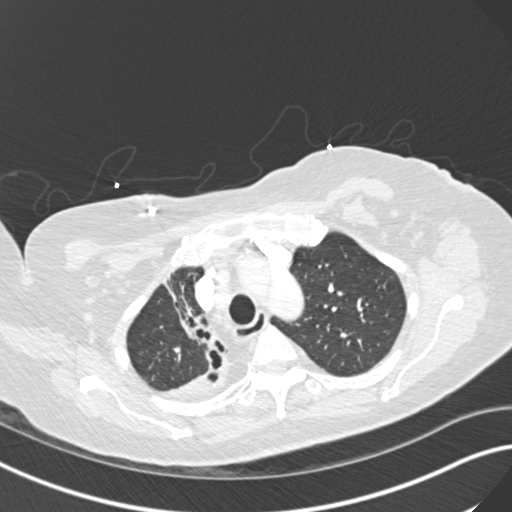
[im 247/283  mediastinal]
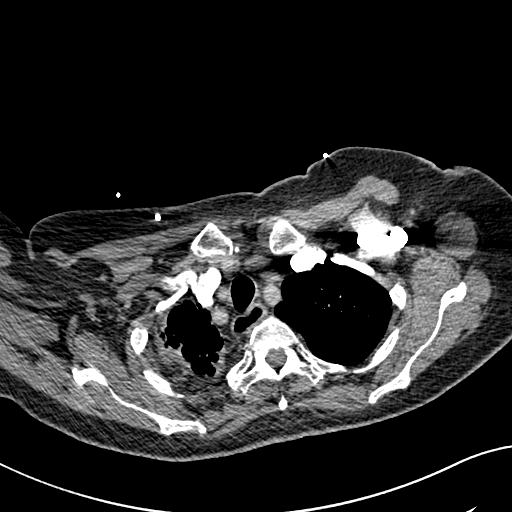
[im 265/283  lung]
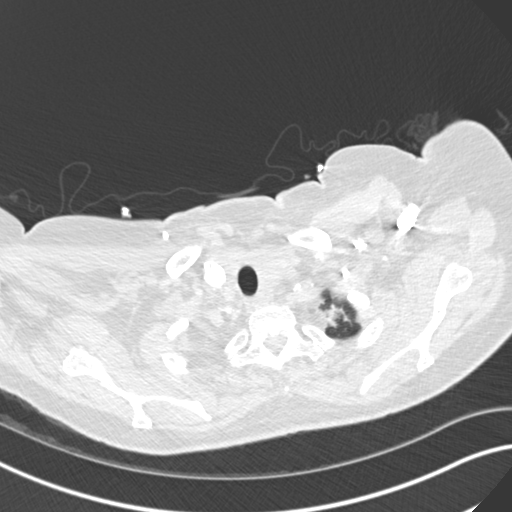

[Series 9: lung windows · axial · 0.71mm/px · z∈[-221,-152]mm · 2 of 93 slices shown]
[im 24/93  mediastinal]
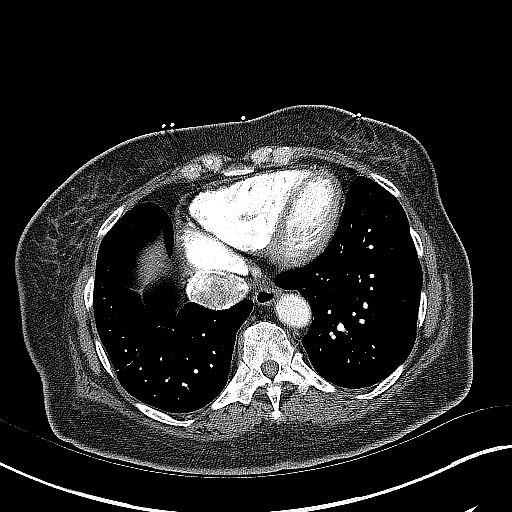
[im 47/93  mediastinal]
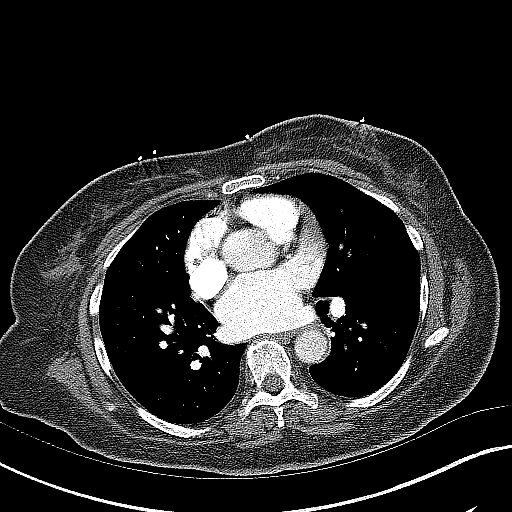

[Series 10: coronal mpr · coronal · 0.62mm/px · 1 of 137 slices shown]
[im 69/137  mediastinal]
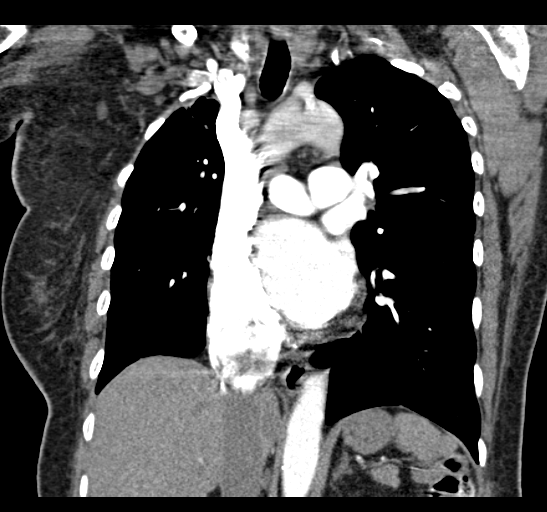

[18 of 36 positions shown; findings below may reference images not displayed]

FINDINGS: Bronchiectasis and scarring in the right apex with volume loss is
stable, likely sequela of previous infection. There is also
associated pleural thickening in this region which is also stable.
There is compensatory expansion of the right middle and lower lobes.
A 3.5 mm nodule in the right lower lobe on series 9, image 37 is not
significantly changed given difference in slice selection. No new or
suspicious nodules. No masses or focal infiltrates. The central
airways are stable. No pneumothorax. Soft tissue to the right of the
trachea on series 6, image 24 measures 2.5 by 1.4 cm today versus
2.4 by 1.3 cm by my measurement previously. No new adenopathy in the
chest. The thoracic aorta is poorly opacified but non aneurysmal
with no dissection. No pulmonary emboli identified. The right upper
lobe pulmonary artery branches are very small due to the chronic
changes in the right upper lobe. No effusions.

The 10 x 17 mm low-attenuation lesion in the posterior aspect of the
caudate is not as well evaluated today but not significantly
changed. Limited views of the upper abdomen are otherwise
unremarkable.

No bony changes.

Review of the MIP images confirms the above findings.
IMPRESSION: 1. No pulmonary emboli.
2. Chronic changes to the right upper lobe.
3. Stable lesion in the caudate lobe of the liver.
4. Stable soft tissue prominence to the right of the trachea as
described above.
5. Stable small nodule in the right lung.

## 2016-05-25 ENCOUNTER — Other Ambulatory Visit: Payer: Self-pay

## 2016-05-25 DIAGNOSIS — C561 Malignant neoplasm of right ovary: Secondary | ICD-10-CM

## 2016-05-25 DIAGNOSIS — C563 Malignant neoplasm of bilateral ovaries: Secondary | ICD-10-CM

## 2016-05-25 DIAGNOSIS — C562 Malignant neoplasm of left ovary: Principal | ICD-10-CM

## 2016-05-25 MED ORDER — LORAZEPAM 0.5 MG PO TABS: ORAL_TABLET | ORAL | Status: DC

## 2016-05-26 ENCOUNTER — Encounter: Payer: Self-pay | Admitting: Oncology

## 2016-05-26 DIAGNOSIS — I69320 Aphasia following cerebral infarction: Secondary | ICD-10-CM | POA: Diagnosis not present

## 2016-05-26 DIAGNOSIS — I69351 Hemiplegia and hemiparesis following cerebral infarction affecting right dominant side: Secondary | ICD-10-CM | POA: Diagnosis not present

## 2016-05-26 DIAGNOSIS — I69390 Apraxia following cerebral infarction: Secondary | ICD-10-CM | POA: Diagnosis not present

## 2016-05-26 DIAGNOSIS — C50919 Malignant neoplasm of unspecified site of unspecified female breast: Secondary | ICD-10-CM | POA: Diagnosis not present

## 2016-05-26 DIAGNOSIS — F329 Major depressive disorder, single episode, unspecified: Secondary | ICD-10-CM | POA: Diagnosis not present

## 2016-05-26 DIAGNOSIS — K5900 Constipation, unspecified: Secondary | ICD-10-CM | POA: Diagnosis not present

## 2016-05-26 NOTE — Progress Notes (Signed)
Per [employer] ondansetron approved 12/06/15-12/06/16 ref# [id number]- I sent to medical records

## 2016-05-27 DIAGNOSIS — K5900 Constipation, unspecified: Secondary | ICD-10-CM | POA: Diagnosis not present

## 2016-05-27 DIAGNOSIS — I69390 Apraxia following cerebral infarction: Secondary | ICD-10-CM | POA: Diagnosis not present

## 2016-05-27 DIAGNOSIS — I69320 Aphasia following cerebral infarction: Secondary | ICD-10-CM | POA: Diagnosis not present

## 2016-05-27 DIAGNOSIS — I69351 Hemiplegia and hemiparesis following cerebral infarction affecting right dominant side: Secondary | ICD-10-CM | POA: Diagnosis not present

## 2016-05-27 DIAGNOSIS — C50919 Malignant neoplasm of unspecified site of unspecified female breast: Secondary | ICD-10-CM | POA: Diagnosis not present

## 2016-05-27 DIAGNOSIS — F329 Major depressive disorder, single episode, unspecified: Secondary | ICD-10-CM | POA: Diagnosis not present

## 2016-05-29 ENCOUNTER — Other Ambulatory Visit (HOSPITAL_BASED_OUTPATIENT_CLINIC_OR_DEPARTMENT_OTHER): Payer: Medicare Other

## 2016-05-29 ENCOUNTER — Ambulatory Visit (HOSPITAL_BASED_OUTPATIENT_CLINIC_OR_DEPARTMENT_OTHER): Payer: Medicare Other

## 2016-05-29 VITALS — BP 98/58 | HR 60 | Temp 96.9°F | Resp 20

## 2016-05-29 DIAGNOSIS — C562 Malignant neoplasm of left ovary: Secondary | ICD-10-CM

## 2016-05-29 DIAGNOSIS — Z5111 Encounter for antineoplastic chemotherapy: Secondary | ICD-10-CM | POA: Diagnosis present

## 2016-05-29 DIAGNOSIS — C561 Malignant neoplasm of right ovary: Secondary | ICD-10-CM | POA: Diagnosis present

## 2016-05-29 DIAGNOSIS — C563 Malignant neoplasm of bilateral ovaries: Secondary | ICD-10-CM

## 2016-05-29 LAB — COMPREHENSIVE METABOLIC PANEL
ALT: 38 U/L (ref 0–55)
AST: 17 U/L (ref 5–34)
Albumin: 3.1 g/dL — ABNORMAL LOW (ref 3.5–5.0)
Alkaline Phosphatase: 109 U/L (ref 40–150)
Anion Gap: 11 mEq/L (ref 3–11)
BUN: 15.2 mg/dL (ref 7.0–26.0)
CO2: 23 mEq/L (ref 22–29)
Calcium: 9.7 mg/dL (ref 8.4–10.4)
Chloride: 108 mEq/L (ref 98–109)
Creatinine: 0.8 mg/dL (ref 0.6–1.1)
EGFR: 90 mL/min/{1.73_m2} — ABNORMAL LOW (ref >90)
Glucose: 164 mg/dl — ABNORMAL HIGH (ref 70–140)
Potassium: 4.1 mEq/L (ref 3.5–5.1)
Sodium: 141 mEq/L (ref 136–145)
Total Bilirubin: 0.51 mg/dL (ref 0.20–1.20)
Total Protein: 7 g/dL (ref 6.4–8.3)

## 2016-05-29 LAB — CBC WITH DIFFERENTIAL/PLATELET
BASO%: 0.1 % (ref 0.0–2.0)
Basophils Absolute: 0 10*3/uL (ref 0.0–0.1)
EOS%: 0 % (ref 0.0–7.0)
Eosinophils Absolute: 0 10*3/uL (ref 0.0–0.5)
HCT: 32.3 % — ABNORMAL LOW (ref 34.8–46.6)
HGB: 10.5 g/dL — ABNORMAL LOW (ref 11.6–15.9)
LYMPH%: 21.8 % (ref 14.0–49.7)
MCH: 32.7 pg (ref 25.1–34.0)
MCHC: 32.5 g/dL (ref 31.5–36.0)
MCV: 100.7 fL (ref 79.5–101.0)
MONO#: 0 10*3/uL — ABNORMAL LOW (ref 0.1–0.9)
MONO%: 0.9 % (ref 0.0–14.0)
NEUT#: 2.4 10*3/uL (ref 1.5–6.5)
NEUT%: 77.2 % — ABNORMAL HIGH (ref 38.4–76.8)
Platelets: 194 10*3/uL (ref 145–400)
RBC: 3.21 10*6/uL — ABNORMAL LOW (ref 3.70–5.45)
RDW: 15.2 % — ABNORMAL HIGH (ref 11.2–14.5)
WBC: 3.2 10*3/uL — ABNORMAL LOW (ref 3.9–10.3)
lymph#: 0.7 10*3/uL — ABNORMAL LOW (ref 0.9–3.3)

## 2016-05-29 MED ORDER — DIPHENHYDRAMINE HCL 50 MG/ML IJ SOLN
25.0000 mg | Freq: Once | INTRAMUSCULAR | Status: AC
  Administered 2016-05-29: 25 mg via INTRAVENOUS

## 2016-05-29 MED ORDER — SODIUM CHLORIDE 0.9 % IV SOLN
Freq: Once | INTRAVENOUS | Status: AC
  Administered 2016-05-29: 11:00:00 via INTRAVENOUS
  Filled 2016-05-29: qty 4

## 2016-05-29 MED ORDER — HEPARIN SOD (PORK) LOCK FLUSH 100 UNIT/ML IV SOLN
500.0000 [IU] | Freq: Once | INTRAVENOUS | Status: AC
  Administered 2016-05-29: 500 [IU] via INTRAVENOUS
  Filled 2016-05-29: qty 5

## 2016-05-29 MED ORDER — SODIUM CHLORIDE 0.9% FLUSH
10.0000 mL | INTRAVENOUS | Status: DC | PRN
  Administered 2016-05-29: 10 mL via INTRAVENOUS
  Filled 2016-05-29: qty 10

## 2016-05-29 MED ORDER — SODIUM CHLORIDE 0.9 % IV SOLN
Freq: Once | INTRAVENOUS | Status: AC
  Administered 2016-05-29: 11:00:00 via INTRAVENOUS

## 2016-05-29 MED ORDER — FAMOTIDINE IN NACL 20-0.9 MG/50ML-% IV SOLN
20.0000 mg | Freq: Once | INTRAVENOUS | Status: AC
  Administered 2016-05-29: 20 mg via INTRAVENOUS

## 2016-05-29 MED ORDER — FAMOTIDINE IN NACL 20-0.9 MG/50ML-% IV SOLN
INTRAVENOUS | Status: AC
  Filled 2016-05-29: qty 50

## 2016-05-29 MED ORDER — DIPHENHYDRAMINE HCL 50 MG/ML IJ SOLN
INTRAMUSCULAR | Status: AC
  Filled 2016-05-29: qty 1

## 2016-05-29 MED ORDER — PACLITAXEL CHEMO INJECTION 300 MG/50ML
80.0000 mg/m2 | Freq: Once | INTRAVENOUS | Status: AC
  Administered 2016-05-29: 162 mg via INTRAVENOUS
  Filled 2016-05-29: qty 27

## 2016-05-30 ENCOUNTER — Ambulatory Visit (HOSPITAL_BASED_OUTPATIENT_CLINIC_OR_DEPARTMENT_OTHER): Payer: Medicare Other

## 2016-05-30 VITALS — BP 103/72 | HR 79 | Temp 98.4°F | Resp 18

## 2016-05-30 DIAGNOSIS — C562 Malignant neoplasm of left ovary: Secondary | ICD-10-CM

## 2016-05-30 DIAGNOSIS — C563 Malignant neoplasm of bilateral ovaries: Secondary | ICD-10-CM

## 2016-05-30 DIAGNOSIS — Z5189 Encounter for other specified aftercare: Secondary | ICD-10-CM | POA: Diagnosis not present

## 2016-05-30 DIAGNOSIS — C561 Malignant neoplasm of right ovary: Secondary | ICD-10-CM | POA: Diagnosis present

## 2016-05-30 MED ORDER — TBO-FILGRASTIM 480 MCG/0.8ML ~~LOC~~ SOSY
480.0000 ug | PREFILLED_SYRINGE | Freq: Once | SUBCUTANEOUS | Status: AC
  Administered 2016-05-30: 480 ug via SUBCUTANEOUS

## 2016-05-31 ENCOUNTER — Other Ambulatory Visit: Payer: Self-pay | Admitting: Oncology

## 2016-05-31 DIAGNOSIS — C562 Malignant neoplasm of left ovary: Secondary | ICD-10-CM

## 2016-06-03 DIAGNOSIS — I69390 Apraxia following cerebral infarction: Secondary | ICD-10-CM | POA: Diagnosis not present

## 2016-06-03 DIAGNOSIS — I69320 Aphasia following cerebral infarction: Secondary | ICD-10-CM | POA: Diagnosis not present

## 2016-06-03 DIAGNOSIS — F329 Major depressive disorder, single episode, unspecified: Secondary | ICD-10-CM | POA: Diagnosis not present

## 2016-06-03 DIAGNOSIS — I69351 Hemiplegia and hemiparesis following cerebral infarction affecting right dominant side: Secondary | ICD-10-CM | POA: Diagnosis not present

## 2016-06-03 DIAGNOSIS — K5900 Constipation, unspecified: Secondary | ICD-10-CM | POA: Diagnosis not present

## 2016-06-03 DIAGNOSIS — C50919 Malignant neoplasm of unspecified site of unspecified female breast: Secondary | ICD-10-CM | POA: Diagnosis not present

## 2016-06-04 ENCOUNTER — Ambulatory Visit (HOSPITAL_BASED_OUTPATIENT_CLINIC_OR_DEPARTMENT_OTHER): Payer: Medicare Other | Admitting: Oncology

## 2016-06-04 ENCOUNTER — Encounter: Payer: Self-pay | Admitting: Oncology

## 2016-06-04 ENCOUNTER — Other Ambulatory Visit: Payer: Self-pay | Admitting: Oncology

## 2016-06-04 ENCOUNTER — Other Ambulatory Visit (HOSPITAL_BASED_OUTPATIENT_CLINIC_OR_DEPARTMENT_OTHER): Payer: Medicare Other

## 2016-06-04 VITALS — BP 108/70 | HR 99 | Temp 100.4°F | Resp 18 | Ht 65.0 in | Wt 174.5 lb

## 2016-06-04 DIAGNOSIS — D701 Agranulocytosis secondary to cancer chemotherapy: Secondary | ICD-10-CM | POA: Diagnosis not present

## 2016-06-04 DIAGNOSIS — C562 Malignant neoplasm of left ovary: Secondary | ICD-10-CM | POA: Diagnosis not present

## 2016-06-04 DIAGNOSIS — D649 Anemia, unspecified: Secondary | ICD-10-CM

## 2016-06-04 DIAGNOSIS — C569 Malignant neoplasm of unspecified ovary: Secondary | ICD-10-CM

## 2016-06-04 DIAGNOSIS — D6481 Anemia due to antineoplastic chemotherapy: Secondary | ICD-10-CM

## 2016-06-04 DIAGNOSIS — Z7901 Long term (current) use of anticoagulants: Secondary | ICD-10-CM | POA: Diagnosis not present

## 2016-06-04 DIAGNOSIS — C563 Malignant neoplasm of bilateral ovaries: Secondary | ICD-10-CM

## 2016-06-04 DIAGNOSIS — G8191 Hemiplegia, unspecified affecting right dominant side: Secondary | ICD-10-CM | POA: Diagnosis not present

## 2016-06-04 DIAGNOSIS — C561 Malignant neoplasm of right ovary: Secondary | ICD-10-CM

## 2016-06-04 DIAGNOSIS — G629 Polyneuropathy, unspecified: Secondary | ICD-10-CM | POA: Diagnosis not present

## 2016-06-04 DIAGNOSIS — T451X5A Adverse effect of antineoplastic and immunosuppressive drugs, initial encounter: Secondary | ICD-10-CM

## 2016-06-04 DIAGNOSIS — G620 Drug-induced polyneuropathy: Secondary | ICD-10-CM

## 2016-06-04 LAB — CBC WITH DIFFERENTIAL/PLATELET
BASO%: 0.3 % (ref 0.0–2.0)
Basophils Absolute: 0 10*3/uL (ref 0.0–0.1)
EOS%: 0.7 % (ref 0.0–7.0)
Eosinophils Absolute: 0 10*3/uL (ref 0.0–0.5)
HCT: 29.6 % — ABNORMAL LOW (ref 34.8–46.6)
HGB: 9.7 g/dL — ABNORMAL LOW (ref 11.6–15.9)
LYMPH%: 48.6 % (ref 14.0–49.7)
MCH: 32.7 pg (ref 25.1–34.0)
MCHC: 32.8 g/dL (ref 31.5–36.0)
MCV: 99.8 fL (ref 79.5–101.0)
MONO#: 0.3 10*3/uL (ref 0.1–0.9)
MONO%: 8.3 % (ref 0.0–14.0)
NEUT#: 1.3 10*3/uL — ABNORMAL LOW (ref 1.5–6.5)
NEUT%: 42.1 % (ref 38.4–76.8)
Platelets: 278 10*3/uL (ref 145–400)
RBC: 2.97 10*6/uL — ABNORMAL LOW (ref 3.70–5.45)
RDW: 15.5 % — ABNORMAL HIGH (ref 11.2–14.5)
WBC: 3 10*3/uL — ABNORMAL LOW (ref 3.9–10.3)
lymph#: 1.5 10*3/uL (ref 0.9–3.3)

## 2016-06-04 LAB — COMPREHENSIVE METABOLIC PANEL
ALT: 32 U/L (ref 0–55)
AST: 19 U/L (ref 5–34)
Albumin: 2.9 g/dL — ABNORMAL LOW (ref 3.5–5.0)
Alkaline Phosphatase: 105 U/L (ref 40–150)
Anion Gap: 7 mEq/L (ref 3–11)
BUN: 10.7 mg/dL (ref 7.0–26.0)
CO2: 29 mEq/L (ref 22–29)
Calcium: 9.3 mg/dL (ref 8.4–10.4)
Chloride: 105 mEq/L (ref 98–109)
Creatinine: 0.7 mg/dL (ref 0.6–1.1)
EGFR: >90 mL/min/{1.73_m2} (ref >90)
Glucose: 103 mg/dl (ref 70–140)
Potassium: 3.6 mEq/L (ref 3.5–5.1)
Sodium: 141 mEq/L (ref 136–145)
Total Bilirubin: 0.43 mg/dL (ref 0.20–1.20)
Total Protein: 6.4 g/dL (ref 6.4–8.3)

## 2016-06-04 MED ORDER — TRAMADOL HCL 50 MG PO TABS: 100.0000 mg | ORAL_TABLET | Freq: Two times a day (BID) | ORAL | Status: DC | PRN

## 2016-06-04 MED ORDER — DOCUSATE SODIUM 100 MG PO CAPS: 100.0000 mg | ORAL_CAPSULE | Freq: Two times a day (BID) | ORAL | Status: DC | PRN

## 2016-06-04 NOTE — Progress Notes (Signed)
OFFICE PROGRESS NOTE   June 05, 2016   Physicians: Everitt Amber, Lendon Collar (PCP), Lindell Spar [phone] neurology [place]), Dr Valetta Fuller (cardiology [place], [phone], new patient apt 01-15-16) Carol Ada (Alapati P. Marcello Moores, PCP [place], [place] [phone]) ( (oncologist Dr Maudie Mercury in [place], [place]; breast radiation given at [facility] in [place]). 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 She is due day 1 cycle 6 on 06-05-16, however ANC today is only 1.3 and hemoglobin lower at 9.7 so will delay start of cycle 6 one week.  Patient complains of feeling badly today, which seems to be fatigue and some nausea . She denies bleeding, fever, bladder symptoms, SOB at rest. Peripheral neuropathy seems stable, with more symptoms RUE/RLE which were most affected by CVA, but no worse LUE/LLE. She feels that grip in left hand has improved with exercises by [facility] PT; with left hand she is able to pick up "larger pills", use utensils and toothbrush, is not dropping objects now. Constipation in past week despite bid miralax, added 1 bottle mag citrate on 6-28 with multiple bowel movements following. She is eating. She does not like afternoon appointments as today.     CA 125 12-24-15 by [employer] ECLIA 608, and on 01-01-16 741 PAC h No genetics testing known with prior breast cancer; with diagnosis now known ovarian, genetics testing scheduled for 07-07-16   ONCOLOGIC HISTORY atient presented to ED in [place] 11-28-15 with LLQ pain and left hip pain, symptomatic for at least a year per family but worse. CT AP 11-28-15 had bilateral ovarian masses up to 4 cm on right, with peritoneal carcinomatosis in pelvis, omentum and perihepatic, with small  ascites, bilateral pelvic and inguinal adenopathy, with retroperitoneal adenopathy extending to pelvic brim. 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 Recommendation was to begin treatment with carboplatin taxol, with reevaluation after 3 cycles for possible interval debulking surgery. Day 1 cycle 1 dose dense carbo taxol was given on 12-27-15, then missed day 8 cycle 1 due to lack of IV access. ANC was 0.1 on day 14 (01-09-16) with granix given x3 and day 15 delayed another week. She received day 15 cycle 1 on 01-17-16 after PICC placed that day. She completed cycle 3 on 02-28-16. CT CAP 03-02-16 showed resolution of ascites and significant improvement in peritoneal disease and adenopathy. She had interval robotic TAH BSO omentectoy and right inguinal lymphadenectomy on 03-24-16, which was R0 debulking. Intraoperatively there appeared to be minimal residual peritoneal and omental disease, with enlarged right iliac node. Pathology [phone] high grade serous carcinoma primary left ovary where residual focal involvement with extensive fibrosis, and focal involvement of omentum also with extensive fibrosis, external iliac node and peritoneum.  She has history of left breast cancer 2005 treated in [place] with lumpectomy, chemotherapy and radiation, none of those records have been obtained.     Objective:  Vital signs in last 24 hours:  BP 108/70 mmHg  Pulse 99  Temp(Src) 100.4 F (38 C) (Oral)  Resp 18  Ht 5' 5"  (1.651 m)  Wt 174 lb 8 oz (79.153 kg)  BMI 29.04 kg/m2  SpO2 100% Weight down 3.5 lbs Alert, oriented and appropriate. In [facility] for office, ambulatory with some assistance at home.  Alopecia  HEENT:PERRL,  sclerae not icteric. Oral mucosa moist without lesions, posterior pharynx clear. Mucous membranes a little pale Neck supple. No JVD.  Lymphatics:no supraclavicular adenopathy Resp: clear to auscultation bilaterally  Cardio: regular rate and rhythm. No gallop. GI: soft, nontender, not distended, no mass or organomegaly. Normally active bowel sounds.  Musculoskeletal/ Extremities: without pitting edema, cords, tenderness Neuro: right hemiparesis. Expressive speech difficulty. PSYCH appropriate mood and affect Skin without rash, ecchymosis, petechiae Portacath-without erythema or tenderness  Lab Results:  Results for orders placed or performed in visit on 06/04/16  CBC with Differential  Result Value Ref Range   WBC 3.0 (L) 3.9 - 10.3 10e3/uL   NEUT# 1.3 (L) 1.5 - 6.5 10e3/uL   HGB 9.7 (L) 11.6 - 15.9 g/dL   HCT 29.6 (L) 34.8 - 46.6 %   Platelets 278 145 - 400 10e3/uL   MCV 99.8 79.5 - 101.0 fL   MCH 32.7 25.1 - 34.0 pg   MCHC 32.8 31.5 - 36.0 g/dL   RBC 2.97 (L) 3.70 - 5.45 10e6/uL   RDW 15.5 (H) 11.2 - 14.5 %   lymph# 1.5 0.9 - 3.3 10e3/uL   MONO# 0.3 0.1 - 0.9 10e3/uL   Eosinophils Absolute 0.0 0.0 - 0.5 10e3/uL   Basophils Absolute 0.0 0.0 - 0.1 10e3/uL   NEUT% 42.1 38.4 - 76.8 %   LYMPH% 48.6 14.0 - 49.7 %   MONO% 8.3 0.0 - 14.0 %   EOS% 0.7 0.0 - 7.0 %   BASO% 0.3 0.0 - 2.0 %  Comprehensive metabolic panel  Result Value Ref Range   Sodium 141 136 - 145 mEq/L   Potassium 3.6 3.5 - 5.1 mEq/L   Chloride 105 98 - 109 mEq/L   CO2 29 22 - 29 mEq/L   Glucose 103 70 - 140 mg/dl   BUN 10.7 7.0 - 26.0 mg/dL   Creatinine 0.7 0.6 - 1.1 mg/dL   Total Bilirubin 0.43 0.20 - 1.20 mg/dL   Alkaline Phosphatase 105 40 - 150 U/L   AST 19 5 - 34 U/L   ALT 32 0 - 55 U/L   Total Protein 6.4 6.4 - 8.3 g/dL   Albumin 2.9 (L) 3.5 - 5.0 g/dL   Calcium 9.3 8.4 - 10.4 mg/dL   Anion Gap 7 3 - 11 mEq/L   EGFR >90 >90 ml/min/1.73 m2  CA 125  Result Value Ref Range   Cancer Antigen (CA) 125  13.7 0.0 - 38.1 U/mL   CA 125 available after visit   Studies/Results:  No results found.  Medications: I have reviewed the patient's current medications. From history and exam, seems ok to continue the taxol , but will hold this if significant neuropathy LUE/ LLE prior to completion of cycle 6.  Needs granix days 2,4,,9,16  (treatment on Fridays)  DISCUSSION CBC reviewed today, patient and family in agreement with delay of cycle 6 x 1 week. Continue exercises by HHPT as tolerated. Call if concerns prior to next scheduled visit. Discussed genetics referral.  Assessment/Plan: 1.IVB high grade serous carcinoma of left ovary, treated to this point with 3 cycles of neoadjuvant dose dense carbo taxol, interval R0 debulking, and continuing chemo planned thru total 6 cycles. Day 1 cycle 6 on 06-12-16 with no  increase in [place], as long as ANC >=1.5 and plt >=100k.  GCSF.days 2,4,9,16. Genetics referral made for 07-07-16 2.Chemo peripheral neuropathy more difficult to assess with right CVA and communication difficulty. Will be very aware of this ongoing, hold taxol if concerns. Needs genetics referral. 2. Chemo neutropenia: ANC 0.4 day 15 cycle 4 despite gCSF. Carbo dose decreased, continue granix.  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 Has resumed outpatient PT/ OT/ speech therapy by [facility], helping. Followed by neurology in [facility]. History of TIAs 2010.  4.atrial fibrillation diagnosed ~ 2011, now on Eliquis; ASA not listed on meds. Now followed by Dr Valetta Fuller [facility], next visit 05-20-16 re BP meds  No problems with chemo thrombocytopenia thus far, but need to be aware of anticoagulation if so. 5.Left breast cancer 2005 diagnosed and treated in [place] with lumpectomy and presumably axillary node evaluation, combination chemotherapy (drugs not known) via peripheral veins, local radiation. Up to date on mammograms, done 10-18-15 at [facility].  6.hx  HTN 7.Constipation: Again resolved with increased laxatives. Colon polyps at colonoscopy by Dr Benson [place] 2015. Follow up not specified on the report in EMR 8.post appendectomy, BTL 9.family history of cardiac disease with MIs in sisters ages 79 and 58, and in brother 84.information on Advance Directives given 11.PAC in, very difficult peripheral IV access so best to leave this after chemo completes in case needed for additonal treatment, at least next 6-12 months. WIll need to flush when not used. 12.anemia: Hb lower today likely contributing to fatigue, not requiring transfusion now,  on ferrous fumarate now. She has not been transfused.  Chemo and granix orders adjusted. All questions answered. TIme spent 25 min including >50% counseling and coordination of care. Route PCP     Gordy Levan, MD   06/05/2016, 5:11 PM

## 2016-06-05 ENCOUNTER — Telehealth: Payer: Self-pay | Admitting: Oncology

## 2016-06-05 ENCOUNTER — Ambulatory Visit: Payer: Medicare Other

## 2016-06-05 DIAGNOSIS — D701 Agranulocytosis secondary to cancer chemotherapy: Secondary | ICD-10-CM | POA: Insufficient documentation

## 2016-06-05 DIAGNOSIS — T451X5A Adverse effect of antineoplastic and immunosuppressive drugs, initial encounter: Secondary | ICD-10-CM | POA: Insufficient documentation

## 2016-06-05 LAB — CA 125: Cancer Antigen (CA) 125: 13.7 U/mL (ref 0.0–38.1)

## 2016-06-05 NOTE — Telephone Encounter (Signed)
lvm to inform pt of next infusion appt 7/7 at 10 am per LL 6/29 pof

## 2016-06-09 DIAGNOSIS — I69320 Aphasia following cerebral infarction: Secondary | ICD-10-CM | POA: Diagnosis not present

## 2016-06-09 DIAGNOSIS — I69390 Apraxia following cerebral infarction: Secondary | ICD-10-CM | POA: Diagnosis not present

## 2016-06-09 DIAGNOSIS — I69351 Hemiplegia and hemiparesis following cerebral infarction affecting right dominant side: Secondary | ICD-10-CM | POA: Diagnosis not present

## 2016-06-09 DIAGNOSIS — K5900 Constipation, unspecified: Secondary | ICD-10-CM | POA: Diagnosis not present

## 2016-06-09 DIAGNOSIS — C50919 Malignant neoplasm of unspecified site of unspecified female breast: Secondary | ICD-10-CM | POA: Diagnosis not present

## 2016-06-09 DIAGNOSIS — F329 Major depressive disorder, single episode, unspecified: Secondary | ICD-10-CM | POA: Diagnosis not present

## 2016-06-10 ENCOUNTER — Telehealth: Payer: Self-pay

## 2016-06-10 DIAGNOSIS — C50919 Malignant neoplasm of unspecified site of unspecified female breast: Secondary | ICD-10-CM | POA: Diagnosis not present

## 2016-06-10 DIAGNOSIS — K5900 Constipation, unspecified: Secondary | ICD-10-CM | POA: Diagnosis not present

## 2016-06-10 DIAGNOSIS — F329 Major depressive disorder, single episode, unspecified: Secondary | ICD-10-CM | POA: Diagnosis not present

## 2016-06-10 DIAGNOSIS — I69390 Apraxia following cerebral infarction: Secondary | ICD-10-CM | POA: Diagnosis not present

## 2016-06-10 DIAGNOSIS — I69351 Hemiplegia and hemiparesis following cerebral infarction affecting right dominant side: Secondary | ICD-10-CM | POA: Diagnosis not present

## 2016-06-10 DIAGNOSIS — I69320 Aphasia following cerebral infarction: Secondary | ICD-10-CM | POA: Diagnosis not present

## 2016-06-10 NOTE — Telephone Encounter (Signed)
S/w [name] results per Dr Edwyna Shell attached message. Confirmed Friday lab and infusion appt.

## 2016-06-10 NOTE — Telephone Encounter (Signed)
-----   Message from Gordy Levan, MD sent at 06/10/2016 10:14 AM EDT ----- Labs seen and need follow up: please let daughter or son know marker in good low range 13.7

## 2016-06-11 DIAGNOSIS — I69390 Apraxia following cerebral infarction: Secondary | ICD-10-CM | POA: Diagnosis not present

## 2016-06-11 DIAGNOSIS — F329 Major depressive disorder, single episode, unspecified: Secondary | ICD-10-CM | POA: Diagnosis not present

## 2016-06-11 DIAGNOSIS — I69320 Aphasia following cerebral infarction: Secondary | ICD-10-CM | POA: Diagnosis not present

## 2016-06-11 DIAGNOSIS — K5900 Constipation, unspecified: Secondary | ICD-10-CM | POA: Diagnosis not present

## 2016-06-11 DIAGNOSIS — I69351 Hemiplegia and hemiparesis following cerebral infarction affecting right dominant side: Secondary | ICD-10-CM | POA: Diagnosis not present

## 2016-06-11 DIAGNOSIS — C50919 Malignant neoplasm of unspecified site of unspecified female breast: Secondary | ICD-10-CM | POA: Diagnosis not present

## 2016-06-12 ENCOUNTER — Ambulatory Visit: Payer: Medicare Other

## 2016-06-12 ENCOUNTER — Ambulatory Visit (HOSPITAL_BASED_OUTPATIENT_CLINIC_OR_DEPARTMENT_OTHER): Payer: Medicare Other

## 2016-06-12 ENCOUNTER — Other Ambulatory Visit (HOSPITAL_BASED_OUTPATIENT_CLINIC_OR_DEPARTMENT_OTHER): Payer: Medicare Other

## 2016-06-12 VITALS — BP 105/66 | HR 60 | Temp 98.1°F | Resp 16

## 2016-06-12 DIAGNOSIS — C561 Malignant neoplasm of right ovary: Secondary | ICD-10-CM

## 2016-06-12 DIAGNOSIS — C563 Malignant neoplasm of bilateral ovaries: Secondary | ICD-10-CM

## 2016-06-12 DIAGNOSIS — Z5111 Encounter for antineoplastic chemotherapy: Secondary | ICD-10-CM

## 2016-06-12 DIAGNOSIS — C562 Malignant neoplasm of left ovary: Secondary | ICD-10-CM | POA: Diagnosis present

## 2016-06-12 LAB — COMPREHENSIVE METABOLIC PANEL
ALT: 21 U/L (ref 0–55)
AST: 17 U/L (ref 5–34)
Albumin: 2.8 g/dL — ABNORMAL LOW (ref 3.5–5.0)
Alkaline Phosphatase: 109 U/L (ref 40–150)
Anion Gap: 11 mEq/L (ref 3–11)
BUN: 14.8 mg/dL (ref 7.0–26.0)
CO2: 22 mEq/L (ref 22–29)
Calcium: 9.8 mg/dL (ref 8.4–10.4)
Chloride: 108 mEq/L (ref 98–109)
Creatinine: 0.7 mg/dL (ref 0.6–1.1)
EGFR: >90 mL/min/{1.73_m2} (ref >90)
Glucose: 150 mg/dl — ABNORMAL HIGH (ref 70–140)
Potassium: 4.3 mEq/L (ref 3.5–5.1)
Sodium: 141 mEq/L (ref 136–145)
Total Bilirubin: 0.35 mg/dL (ref 0.20–1.20)
Total Protein: 6.6 g/dL (ref 6.4–8.3)

## 2016-06-12 LAB — CBC WITH DIFFERENTIAL/PLATELET
BASO%: 0.1 % (ref 0.0–2.0)
Basophils Absolute: 0 10*3/uL (ref 0.0–0.1)
EOS%: 0 % (ref 0.0–7.0)
Eosinophils Absolute: 0 10*3/uL (ref 0.0–0.5)
HCT: 28.4 % — ABNORMAL LOW (ref 34.8–46.6)
HGB: 9.3 g/dL — ABNORMAL LOW (ref 11.6–15.9)
LYMPH%: 16 % (ref 14.0–49.7)
MCH: 31.5 pg (ref 25.1–34.0)
MCHC: 32.7 g/dL (ref 31.5–36.0)
MCV: 96.3 fL (ref 79.5–101.0)
MONO#: 0.1 10*3/uL (ref 0.1–0.9)
MONO%: 1.6 % (ref 0.0–14.0)
NEUT#: 6.5 10*3/uL (ref 1.5–6.5)
NEUT%: 82.3 % — ABNORMAL HIGH (ref 38.4–76.8)
Platelets: 344 10*3/uL (ref 145–400)
RBC: 2.95 10*6/uL — ABNORMAL LOW (ref 3.70–5.45)
RDW: 16.1 % — ABNORMAL HIGH (ref 11.2–14.5)
WBC: 7.9 10*3/uL (ref 3.9–10.3)
lymph#: 1.3 10*3/uL (ref 0.9–3.3)

## 2016-06-12 MED ORDER — HEPARIN SOD (PORK) LOCK FLUSH 100 UNIT/ML IV SOLN
500.0000 [IU] | Freq: Once | INTRAVENOUS | Status: AC | PRN
  Administered 2016-06-12: 500 [IU]
  Filled 2016-06-12: qty 5

## 2016-06-12 MED ORDER — FAMOTIDINE IN NACL 20-0.9 MG/50ML-% IV SOLN
20.0000 mg | Freq: Once | INTRAVENOUS | Status: AC
  Administered 2016-06-12: 20 mg via INTRAVENOUS

## 2016-06-12 MED ORDER — PALONOSETRON HCL INJECTION 0.25 MG/5ML
0.2500 mg | Freq: Once | INTRAVENOUS | Status: AC
  Administered 2016-06-12: 0.25 mg via INTRAVENOUS

## 2016-06-12 MED ORDER — PALONOSETRON HCL INJECTION 0.25 MG/5ML
INTRAVENOUS | Status: AC
  Filled 2016-06-12: qty 5

## 2016-06-12 MED ORDER — SODIUM CHLORIDE 0.9 % IV SOLN
80.0000 mg/m2 | Freq: Once | INTRAVENOUS | Status: AC
  Administered 2016-06-12: 162 mg via INTRAVENOUS
  Filled 2016-06-12: qty 27

## 2016-06-12 MED ORDER — SODIUM CHLORIDE 0.9 % IV SOLN
20.0000 mg | Freq: Once | INTRAVENOUS | Status: AC
  Administered 2016-06-12: 20 mg via INTRAVENOUS
  Filled 2016-06-12: qty 2

## 2016-06-12 MED ORDER — SODIUM CHLORIDE 0.9 % IJ SOLN
10.0000 mL | INTRAMUSCULAR | Status: DC | PRN
Start: 2016-06-12 — End: 2016-06-12
  Administered 2016-06-12: 10 mL
  Filled 2016-06-12: qty 10

## 2016-06-12 MED ORDER — SODIUM CHLORIDE 0.9 % IV SOLN
407.2000 mg | Freq: Once | INTRAVENOUS | Status: AC
  Administered 2016-06-12: 410 mg via INTRAVENOUS
  Filled 2016-06-12: qty 41

## 2016-06-12 MED ORDER — FAMOTIDINE IN NACL 20-0.9 MG/50ML-% IV SOLN
INTRAVENOUS | Status: AC
  Filled 2016-06-12: qty 50

## 2016-06-12 MED ORDER — SODIUM CHLORIDE 0.9 % IV SOLN
Freq: Once | INTRAVENOUS | Status: AC
  Administered 2016-06-12: 10:00:00 via INTRAVENOUS

## 2016-06-12 MED ORDER — DIPHENHYDRAMINE HCL 50 MG/ML IJ SOLN
25.0000 mg | Freq: Once | INTRAMUSCULAR | Status: AC
  Administered 2016-06-12: 25 mg via INTRAVENOUS

## 2016-06-12 MED ORDER — DIPHENHYDRAMINE HCL 50 MG/ML IJ SOLN
INTRAMUSCULAR | Status: AC
  Filled 2016-06-12: qty 1

## 2016-06-12 NOTE — Patient Instructions (Signed)
[facility] Discharge Instructions for Patients Receiving Chemotherapy  Today you received the following chemotherapy agents: Taxol and Carboplatin.  To help prevent nausea and vomiting after your treatment, we encourage you to take your nausea medication: Compazine 10 mg every 6 hours as needed; Zofran 8 mg every 8 hours as needed.   If you develop nausea and vomiting that is not controlled by your nausea medication, call the clinic.   BELOW ARE SYMPTOMS THAT SHOULD BE REPORTED IMMEDIATELY:  *FEVER GREATER THAN 100.5 F  *CHILLS WITH OR WITHOUT FEVER  NAUSEA AND VOMITING THAT IS NOT CONTROLLED WITH YOUR NAUSEA MEDICATION  *UNUSUAL SHORTNESS OF BREATH  *UNUSUAL BRUISING OR BLEEDING  TENDERNESS IN MOUTH AND THROAT WITH OR WITHOUT PRESENCE OF ULCERS  *URINARY PROBLEMS  *BOWEL PROBLEMS  UNUSUAL RASH Items with * indicate a potential emergency and should be followed up as soon as possible.  Feel free to call the clinic you have any questions or concerns. The clinic phone number is [phone].  Please show the CHEMO ALERT CARD at check-in to the Emergency Department and triage nurse.   

## 2016-06-13 ENCOUNTER — Ambulatory Visit (HOSPITAL_BASED_OUTPATIENT_CLINIC_OR_DEPARTMENT_OTHER): Payer: Medicare Other

## 2016-06-13 VITALS — BP 122/76 | HR 72 | Temp 98.3°F | Resp 16

## 2016-06-13 DIAGNOSIS — C563 Malignant neoplasm of bilateral ovaries: Secondary | ICD-10-CM

## 2016-06-13 DIAGNOSIS — C561 Malignant neoplasm of right ovary: Secondary | ICD-10-CM | POA: Diagnosis not present

## 2016-06-13 DIAGNOSIS — C562 Malignant neoplasm of left ovary: Secondary | ICD-10-CM

## 2016-06-13 DIAGNOSIS — Z95828 Presence of other vascular implants and grafts: Secondary | ICD-10-CM

## 2016-06-13 MED ORDER — TBO-FILGRASTIM 480 MCG/0.8ML ~~LOC~~ SOSY
480.0000 ug | PREFILLED_SYRINGE | Freq: Once | SUBCUTANEOUS | Status: AC
  Administered 2016-06-13: 480 ug via SUBCUTANEOUS

## 2016-06-15 ENCOUNTER — Ambulatory Visit (HOSPITAL_BASED_OUTPATIENT_CLINIC_OR_DEPARTMENT_OTHER): Payer: Medicare Other

## 2016-06-15 VITALS — BP 129/84 | HR 89 | Temp 98.5°F | Resp 18

## 2016-06-15 DIAGNOSIS — C563 Malignant neoplasm of bilateral ovaries: Secondary | ICD-10-CM

## 2016-06-15 DIAGNOSIS — I63512 Cerebral infarction due to unspecified occlusion or stenosis of left middle cerebral artery: Secondary | ICD-10-CM | POA: Diagnosis not present

## 2016-06-15 DIAGNOSIS — C561 Malignant neoplasm of right ovary: Secondary | ICD-10-CM

## 2016-06-15 DIAGNOSIS — F411 Generalized anxiety disorder: Secondary | ICD-10-CM | POA: Diagnosis not present

## 2016-06-15 DIAGNOSIS — C562 Malignant neoplasm of left ovary: Secondary | ICD-10-CM

## 2016-06-15 DIAGNOSIS — Z5189 Encounter for other specified aftercare: Secondary | ICD-10-CM | POA: Diagnosis not present

## 2016-06-15 DIAGNOSIS — Z95828 Presence of other vascular implants and grafts: Secondary | ICD-10-CM

## 2016-06-15 DIAGNOSIS — I10 Essential (primary) hypertension: Secondary | ICD-10-CM | POA: Diagnosis not present

## 2016-06-15 DIAGNOSIS — F322 Major depressive disorder, single episode, severe without psychotic features: Secondary | ICD-10-CM | POA: Diagnosis not present

## 2016-06-15 MED ORDER — TBO-FILGRASTIM 480 MCG/0.8ML ~~LOC~~ SOSY
480.0000 ug | PREFILLED_SYRINGE | Freq: Once | SUBCUTANEOUS | Status: AC
  Administered 2016-06-15: 480 ug via SUBCUTANEOUS
  Filled 2016-06-15: qty 0.8

## 2016-06-15 MED ORDER — ALTEPLASE 2 MG IJ SOLR
2.0000 mg | Freq: Once | INTRAMUSCULAR | Status: DC | PRN
  Filled 2016-06-15: qty 2

## 2016-06-15 NOTE — Patient Instructions (Signed)

## 2016-06-18 DIAGNOSIS — C50919 Malignant neoplasm of unspecified site of unspecified female breast: Secondary | ICD-10-CM | POA: Diagnosis not present

## 2016-06-18 DIAGNOSIS — F329 Major depressive disorder, single episode, unspecified: Secondary | ICD-10-CM | POA: Diagnosis not present

## 2016-06-18 DIAGNOSIS — K5900 Constipation, unspecified: Secondary | ICD-10-CM | POA: Diagnosis not present

## 2016-06-18 DIAGNOSIS — I69390 Apraxia following cerebral infarction: Secondary | ICD-10-CM | POA: Diagnosis not present

## 2016-06-18 DIAGNOSIS — I69351 Hemiplegia and hemiparesis following cerebral infarction affecting right dominant side: Secondary | ICD-10-CM | POA: Diagnosis not present

## 2016-06-18 DIAGNOSIS — I69320 Aphasia following cerebral infarction: Secondary | ICD-10-CM | POA: Diagnosis not present

## 2016-06-19 ENCOUNTER — Ambulatory Visit: Payer: Medicare Other

## 2016-06-19 ENCOUNTER — Ambulatory Visit (HOSPITAL_BASED_OUTPATIENT_CLINIC_OR_DEPARTMENT_OTHER): Payer: Medicare Other

## 2016-06-19 VITALS — BP 107/67 | HR 70 | Temp 98.5°F | Resp 17

## 2016-06-19 DIAGNOSIS — Z5111 Encounter for antineoplastic chemotherapy: Secondary | ICD-10-CM

## 2016-06-19 DIAGNOSIS — C563 Malignant neoplasm of bilateral ovaries: Secondary | ICD-10-CM

## 2016-06-19 DIAGNOSIS — C562 Malignant neoplasm of left ovary: Secondary | ICD-10-CM | POA: Diagnosis not present

## 2016-06-19 DIAGNOSIS — C561 Malignant neoplasm of right ovary: Secondary | ICD-10-CM

## 2016-06-19 LAB — COMPREHENSIVE METABOLIC PANEL
ALT: 30 U/L (ref 0–55)
AST: 20 U/L (ref 5–34)
Albumin: 3 g/dL — ABNORMAL LOW (ref 3.5–5.0)
Alkaline Phosphatase: 124 U/L (ref 40–150)
Anion Gap: 12 mEq/L — ABNORMAL HIGH (ref 3–11)
BUN: 15.8 mg/dL (ref 7.0–26.0)
CO2: 22 mEq/L (ref 22–29)
Calcium: 9.4 mg/dL (ref 8.4–10.4)
Chloride: 104 mEq/L (ref 98–109)
Creatinine: 0.8 mg/dL (ref 0.6–1.1)
EGFR: >90 mL/min/{1.73_m2} (ref >90)
Glucose: 198 mg/dl — ABNORMAL HIGH (ref 70–140)
Potassium: 4.1 mEq/L (ref 3.5–5.1)
Sodium: 137 mEq/L (ref 136–145)
Total Bilirubin: 0.77 mg/dL (ref 0.20–1.20)
Total Protein: 6.9 g/dL (ref 6.4–8.3)

## 2016-06-19 LAB — CBC WITH DIFFERENTIAL/PLATELET
BASO%: 0.1 % (ref 0.0–2.0)
Basophils Absolute: 0 10*3/uL (ref 0.0–0.1)
EOS%: 0 % (ref 0.0–7.0)
Eosinophils Absolute: 0 10*3/uL (ref 0.0–0.5)
HCT: 27.9 % — ABNORMAL LOW (ref 34.8–46.6)
HGB: 9.5 g/dL — ABNORMAL LOW (ref 11.6–15.9)
LYMPH%: 12.9 % — ABNORMAL LOW (ref 14.0–49.7)
MCH: 32.1 pg (ref 25.1–34.0)
MCHC: 34.1 g/dL (ref 31.5–36.0)
MCV: 94.3 fL (ref 79.5–101.0)
MONO#: 0.1 10*3/uL (ref 0.1–0.9)
MONO%: 0.9 % (ref 0.0–14.0)
NEUT#: 5.7 10*3/uL (ref 1.5–6.5)
NEUT%: 86.1 % — ABNORMAL HIGH (ref 38.4–76.8)
Platelets: 294 10*3/uL (ref 145–400)
RBC: 2.96 10*6/uL — ABNORMAL LOW (ref 3.70–5.45)
RDW: 16.1 % — ABNORMAL HIGH (ref 11.2–14.5)
WBC: 6.7 10*3/uL (ref 3.9–10.3)
lymph#: 0.9 10*3/uL (ref 0.9–3.3)

## 2016-06-19 MED ORDER — SODIUM CHLORIDE 0.9 % IV SOLN
80.0000 mg/m2 | Freq: Once | INTRAVENOUS | Status: AC
  Administered 2016-06-19: 162 mg via INTRAVENOUS
  Filled 2016-06-19: qty 27

## 2016-06-19 MED ORDER — DIPHENHYDRAMINE HCL 50 MG/ML IJ SOLN
INTRAMUSCULAR | Status: AC
  Filled 2016-06-19: qty 1

## 2016-06-19 MED ORDER — SODIUM CHLORIDE 0.9 % IV SOLN
Freq: Once | INTRAVENOUS | Status: AC
  Administered 2016-06-19: 12:00:00 via INTRAVENOUS
  Filled 2016-06-19: qty 4

## 2016-06-19 MED ORDER — FAMOTIDINE IN NACL 20-0.9 MG/50ML-% IV SOLN
INTRAVENOUS | Status: AC
  Filled 2016-06-19: qty 50

## 2016-06-19 MED ORDER — SODIUM CHLORIDE 0.9 % IV SOLN
Freq: Once | INTRAVENOUS | Status: AC
  Administered 2016-06-19: 11:00:00 via INTRAVENOUS

## 2016-06-19 MED ORDER — DIPHENHYDRAMINE HCL 50 MG/ML IJ SOLN
25.0000 mg | Freq: Once | INTRAMUSCULAR | Status: AC
  Administered 2016-06-19: 25 mg via INTRAVENOUS

## 2016-06-19 MED ORDER — FAMOTIDINE IN NACL 20-0.9 MG/50ML-% IV SOLN
20.0000 mg | Freq: Once | INTRAVENOUS | Status: AC
  Administered 2016-06-19: 20 mg via INTRAVENOUS

## 2016-06-19 NOTE — Patient Instructions (Signed)
Paclitaxel injection What is this medicine? PACLITAXEL (PAK li TAX el) is a chemotherapy drug. It targets fast dividing cells, like cancer cells, and causes these cells to die. This medicine is used to treat ovarian cancer, breast cancer, and other cancers. This medicine may be used for other purposes; ask your health care provider or pharmacist if you have questions. What should I tell my health care provider before I take this medicine? They need to know if you have any of these conditions: -blood disorders -irregular heartbeat -infection (especially a virus infection such as chickenpox, cold sores, or herpes) -liver disease -previous or ongoing radiation therapy -an unusual or allergic reaction to paclitaxel, alcohol, polyoxyethylated castor oil, other chemotherapy agents, other medicines, foods, dyes, or preservatives -pregnant or trying to get pregnant -breast-feeding How should I use this medicine? This drug is given as an infusion into a vein. It is administered in a hospital or clinic by a specially trained health care professional. Talk to your pediatrician regarding the use of this medicine in children. Special care may be needed. Overdosage: If you think you have taken too much of this medicine contact a poison control center or emergency room at once. NOTE: This medicine is only for you. Do not share this medicine with others. What if I miss a dose? It is important not to miss your dose. Call your doctor or health care professional if you are unable to keep an appointment. What may interact with this medicine? Do not take this medicine with any of the following medications: -disulfiram -metronidazole This medicine may also interact with the following medications: -cyclosporine -diazepam -ketoconazole -medicines to increase blood counts like filgrastim, pegfilgrastim, sargramostim -other chemotherapy drugs like cisplatin, doxorubicin, epirubicin, etoposide, teniposide,  vincristine -quinidine -testosterone -vaccines -verapamil Talk to your doctor or health care professional before taking any of these medicines: -acetaminophen -aspirin -ibuprofen -ketoprofen -naproxen This list may not describe all possible interactions. Give your health care provider a list of all the medicines, herbs, non-prescription drugs, or dietary supplements you use. Also tell them if you smoke, drink alcohol, or use illegal drugs. Some items may interact with your medicine. What should I watch for while using this medicine? Your condition will be monitored carefully while you are receiving this medicine. You will need important blood work done while you are taking this medicine. This drug may make you feel generally unwell. This is not uncommon, as chemotherapy can affect healthy cells as well as cancer cells. Report any side effects. Continue your course of treatment even though you feel ill unless your doctor tells you to stop. This medicine can cause serious allergic reactions. To reduce your risk you will need to take other medicine(s) before treatment with this medicine. In some cases, you may be given additional medicines to help with side effects. Follow all directions for their use. Call your doctor or health care professional for advice if you get a fever, chills or sore throat, or other symptoms of a cold or flu. Do not treat yourself. This drug decreases your body's ability to fight infections. Try to avoid being around people who are sick. This medicine may increase your risk to bruise or bleed. Call your doctor or health care professional if you notice any unusual bleeding. Be careful brushing and flossing your teeth or using a toothpick because you may get an infection or bleed more easily. If you have any dental work done, tell your dentist you are receiving this medicine. Avoid taking products that   contain aspirin, acetaminophen, ibuprofen, naproxen, or ketoprofen unless  instructed by your doctor. These medicines may hide a fever. Do not become pregnant while taking this medicine. Women should inform their doctor if they wish to become pregnant or think they might be pregnant. There is a potential for serious side effects to an unborn child. Talk to your health care professional or pharmacist for more information. Do not breast-feed an infant while taking this medicine. Men are advised not to father a child while receiving this medicine. This product may contain alcohol. Ask your pharmacist or healthcare provider if this medicine contains alcohol. Be sure to tell all healthcare providers you are taking this medicine. Certain medicines, like metronidazole and disulfiram, can cause an unpleasant reaction when taken with alcohol. The reaction includes flushing, headache, nausea, vomiting, sweating, and increased thirst. The reaction can last from 30 minutes to several hours. What side effects may I notice from receiving this medicine? Side effects that you should report to your doctor or health care professional as soon as possible: -allergic reactions like skin rash, itching or hives, swelling of the face, lips, or tongue -low blood counts - This drug may decrease the number of white blood cells, red blood cells and platelets. You may be at increased risk for infections and bleeding. -signs of infection - fever or chills, cough, sore throat, pain or difficulty passing urine -signs of decreased platelets or bleeding - bruising, pinpoint red spots on the skin, black, tarry stools, nosebleeds -signs of decreased red blood cells - unusually weak or tired, fainting spells, lightheadedness -breathing problems -chest pain -high or low blood pressure -mouth sores -nausea and vomiting -pain, swelling, redness or irritation at the injection site -pain, tingling, numbness in the hands or feet -slow or irregular heartbeat -swelling of the ankle, feet, hands Side effects that  usually do not require medical attention (report to your doctor or health care professional if they continue or are bothersome): -bone pain -complete hair loss including hair on your head, underarms, pubic hair, eyebrows, and eyelashes -changes in the color of fingernails -diarrhea -loosening of the fingernails -loss of appetite -muscle or joint pain -red flush to skin -sweating This list may not describe all possible side effects. Call your doctor for medical advice about side effects. You may report side effects to FDA at 1-800-FDA-1088. Where should I keep my medicine? This drug is given in a hospital or clinic and will not be stored at home. NOTE: This sheet is a summary. It may not cover all possible information. If you have questions about this medicine, talk to your doctor, pharmacist, or health care provider.    2016, [employer]. (2015-07-11 13:02:56)  

## 2016-06-20 ENCOUNTER — Ambulatory Visit (HOSPITAL_BASED_OUTPATIENT_CLINIC_OR_DEPARTMENT_OTHER): Payer: Medicare Other

## 2016-06-20 VITALS — BP 107/64 | HR 82 | Temp 98.7°F | Resp 18

## 2016-06-20 DIAGNOSIS — C563 Malignant neoplasm of bilateral ovaries: Secondary | ICD-10-CM

## 2016-06-20 DIAGNOSIS — C562 Malignant neoplasm of left ovary: Secondary | ICD-10-CM | POA: Diagnosis not present

## 2016-06-20 DIAGNOSIS — C561 Malignant neoplasm of right ovary: Secondary | ICD-10-CM | POA: Diagnosis present

## 2016-06-20 MED ORDER — TBO-FILGRASTIM 480 MCG/0.8ML ~~LOC~~ SOSY
480.0000 ug | PREFILLED_SYRINGE | Freq: Once | SUBCUTANEOUS | Status: AC
  Administered 2016-06-20: 480 ug via SUBCUTANEOUS

## 2016-06-20 NOTE — Patient Instructions (Signed)

## 2016-06-21 ENCOUNTER — Other Ambulatory Visit: Payer: Self-pay | Admitting: Oncology

## 2016-06-23 DIAGNOSIS — I69320 Aphasia following cerebral infarction: Secondary | ICD-10-CM | POA: Diagnosis not present

## 2016-06-23 DIAGNOSIS — I69390 Apraxia following cerebral infarction: Secondary | ICD-10-CM | POA: Diagnosis not present

## 2016-06-23 DIAGNOSIS — I69351 Hemiplegia and hemiparesis following cerebral infarction affecting right dominant side: Secondary | ICD-10-CM | POA: Diagnosis not present

## 2016-06-23 DIAGNOSIS — F329 Major depressive disorder, single episode, unspecified: Secondary | ICD-10-CM | POA: Diagnosis not present

## 2016-06-23 DIAGNOSIS — C50919 Malignant neoplasm of unspecified site of unspecified female breast: Secondary | ICD-10-CM | POA: Diagnosis not present

## 2016-06-23 DIAGNOSIS — K5900 Constipation, unspecified: Secondary | ICD-10-CM | POA: Diagnosis not present

## 2016-06-25 ENCOUNTER — Telehealth: Payer: Self-pay | Admitting: Oncology

## 2016-06-25 ENCOUNTER — Ambulatory Visit (HOSPITAL_BASED_OUTPATIENT_CLINIC_OR_DEPARTMENT_OTHER): Payer: Medicare Other | Admitting: Oncology

## 2016-06-25 ENCOUNTER — Other Ambulatory Visit (HOSPITAL_BASED_OUTPATIENT_CLINIC_OR_DEPARTMENT_OTHER): Payer: Medicare Other

## 2016-06-25 ENCOUNTER — Encounter: Payer: Self-pay | Admitting: Oncology

## 2016-06-25 VITALS — BP 98/57 | HR 80 | Temp 98.2°F | Resp 18 | Ht 65.0 in | Wt 175.2 lb

## 2016-06-25 DIAGNOSIS — D701 Agranulocytosis secondary to cancer chemotherapy: Secondary | ICD-10-CM | POA: Diagnosis not present

## 2016-06-25 DIAGNOSIS — Z853 Personal history of malignant neoplasm of breast: Secondary | ICD-10-CM | POA: Diagnosis not present

## 2016-06-25 DIAGNOSIS — D6481 Anemia due to antineoplastic chemotherapy: Secondary | ICD-10-CM | POA: Diagnosis not present

## 2016-06-25 DIAGNOSIS — C50919 Malignant neoplasm of unspecified site of unspecified female breast: Secondary | ICD-10-CM | POA: Diagnosis not present

## 2016-06-25 DIAGNOSIS — K5900 Constipation, unspecified: Secondary | ICD-10-CM | POA: Diagnosis not present

## 2016-06-25 DIAGNOSIS — C562 Malignant neoplasm of left ovary: Secondary | ICD-10-CM

## 2016-06-25 DIAGNOSIS — C569 Malignant neoplasm of unspecified ovary: Secondary | ICD-10-CM

## 2016-06-25 DIAGNOSIS — I10 Essential (primary) hypertension: Secondary | ICD-10-CM

## 2016-06-25 DIAGNOSIS — G620 Drug-induced polyneuropathy: Secondary | ICD-10-CM

## 2016-06-25 DIAGNOSIS — I4891 Unspecified atrial fibrillation: Secondary | ICD-10-CM

## 2016-06-25 DIAGNOSIS — C563 Malignant neoplasm of bilateral ovaries: Secondary | ICD-10-CM

## 2016-06-25 DIAGNOSIS — Z95828 Presence of other vascular implants and grafts: Secondary | ICD-10-CM

## 2016-06-25 DIAGNOSIS — I69390 Apraxia following cerebral infarction: Secondary | ICD-10-CM | POA: Diagnosis not present

## 2016-06-25 DIAGNOSIS — I69351 Hemiplegia and hemiparesis following cerebral infarction affecting right dominant side: Secondary | ICD-10-CM | POA: Diagnosis not present

## 2016-06-25 DIAGNOSIS — G622 Polyneuropathy due to other toxic agents: Secondary | ICD-10-CM | POA: Diagnosis not present

## 2016-06-25 DIAGNOSIS — Z8673 Personal history of transient ischemic attack (TIA), and cerebral infarction without residual deficits: Secondary | ICD-10-CM

## 2016-06-25 DIAGNOSIS — I69320 Aphasia following cerebral infarction: Secondary | ICD-10-CM | POA: Diagnosis not present

## 2016-06-25 DIAGNOSIS — F329 Major depressive disorder, single episode, unspecified: Secondary | ICD-10-CM | POA: Diagnosis not present

## 2016-06-25 DIAGNOSIS — C561 Malignant neoplasm of right ovary: Secondary | ICD-10-CM

## 2016-06-25 DIAGNOSIS — T451X5A Adverse effect of antineoplastic and immunosuppressive drugs, initial encounter: Secondary | ICD-10-CM

## 2016-06-25 DIAGNOSIS — Z7901 Long term (current) use of anticoagulants: Secondary | ICD-10-CM

## 2016-06-25 LAB — COMPREHENSIVE METABOLIC PANEL
ALT: 23 U/L (ref 0–55)
AST: 14 U/L (ref 5–34)
Albumin: 2.8 g/dL — ABNORMAL LOW (ref 3.5–5.0)
Alkaline Phosphatase: 92 U/L (ref 40–150)
Anion Gap: 10 mEq/L (ref 3–11)
BUN: 11.1 mg/dL (ref 7.0–26.0)
CO2: 26 mEq/L (ref 22–29)
Calcium: 9.2 mg/dL (ref 8.4–10.4)
Chloride: 106 mEq/L (ref 98–109)
Creatinine: 0.7 mg/dL (ref 0.6–1.1)
EGFR: >90 mL/min/{1.73_m2} (ref >90)
Glucose: 119 mg/dl (ref 70–140)
Potassium: 3.8 mEq/L (ref 3.5–5.1)
Sodium: 142 mEq/L (ref 136–145)
Total Bilirubin: 0.91 mg/dL (ref 0.20–1.20)
Total Protein: 6.3 g/dL — ABNORMAL LOW (ref 6.4–8.3)

## 2016-06-25 LAB — CBC WITH DIFFERENTIAL/PLATELET
BASO%: 0 % (ref 0.0–2.0)
Basophils Absolute: 0 10*3/uL (ref 0.0–0.1)
EOS%: 0.4 % (ref 0.0–7.0)
Eosinophils Absolute: 0 10*3/uL (ref 0.0–0.5)
HCT: 25.8 % — ABNORMAL LOW (ref 34.8–46.6)
HGB: 8.7 g/dL — ABNORMAL LOW (ref 11.6–15.9)
LYMPH%: 52.5 % — ABNORMAL HIGH (ref 14.0–49.7)
MCH: 31.4 pg (ref 25.1–34.0)
MCHC: 33.7 g/dL (ref 31.5–36.0)
MCV: 93.1 fL (ref 79.5–101.0)
MONO#: 0.2 10*3/uL (ref 0.1–0.9)
MONO%: 7.8 % (ref 0.0–14.0)
NEUT#: 1 10*3/uL — ABNORMAL LOW (ref 1.5–6.5)
NEUT%: 39.3 % (ref 38.4–76.8)
Platelets: 222 10*3/uL (ref 145–400)
RBC: 2.77 10*6/uL — ABNORMAL LOW (ref 3.70–5.45)
RDW: 16.2 % — ABNORMAL HIGH (ref 11.2–14.5)
WBC: 2.6 10*3/uL — ABNORMAL LOW (ref 3.9–10.3)
lymph#: 1.4 10*3/uL (ref 0.9–3.3)

## 2016-06-25 MED ORDER — SODIUM CHLORIDE 0.9 % IJ SOLN
10.0000 mL | INTRAMUSCULAR | Status: DC | PRN
  Administered 2016-06-25: 10 mL via INTRAVENOUS
  Filled 2016-06-25: qty 10

## 2016-06-25 MED ORDER — HEPARIN SOD (PORK) LOCK FLUSH 100 UNIT/ML IV SOLN
500.0000 [IU] | Freq: Once | INTRAVENOUS | Status: AC | PRN
  Administered 2016-06-25: 500 [IU] via INTRAVENOUS
  Filled 2016-06-25: qty 5

## 2016-06-25 NOTE — Telephone Encounter (Signed)
appt made and avs printed. CT to be scheduled by central radiology. Contrast given to pt today

## 2016-06-25 NOTE — Progress Notes (Signed)
OFFICE PROGRESS NOTE   June 25, 2016   Physicians: Everitt Amber, Lendon Collar (PCP), Lindell Spar [phone] neurology [place]), Dr Valetta Fuller (cardiology [place], [phone], new patient apt 01-15-16) Carol Ada (Alapati P. Marcello Moores, PCP [place], [place] [phone]) ( (oncologist Dr Maudie Mercury in [place], [place]; breast radiation given at [facility] in [place]). 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 As counts are too low for treatment today and as she is tolerating chemo progressively more poorly, will omit day 15 cycle 6. She will have restaging scans prior to follow up with Dr Denman George in August.   Patient is more fatigued, likely in large part related to hgb down to 8.7 and ANC 1.0 today. She has had no fever and no bleeding; she has not begun oral iron due to constipation concerns (see below). She reports more bothersome peripheral neuropathy hands and feet, symptoms complicated by CVA. She has not been tolerating walking and other exercise as well with the fatigue. She has some nausea, no vomiting, is eating and drinking fluids. She slept last pm. She denies SOB at rest. She denies new or different pain. No problems with PAC. Voiding ok. No LE swelling. Remainder of 10 point Review of Systems negative/ unchanged.    CA 125 12-24-15 by [employer] ECLIA 608, and on 01-01-16 741 PAC in, flushed 06-19-16 and should be used for upcoming scans No genetics testing known with prior breast cancer; with diagnosis now known ovarian, genetics testing scheduled for 07-07-16  ONCOLOGIC HISTORY Patient presented to ED in [place] 11-28-15 with LLQ pain and left hip pain, symptomatic for at least a year per family but worse. CT AP 11-28-15 had bilateral ovarian masses up to  4 cm on right, with peritoneal carcinomatosis in pelvis, omentum and perihepatic, with small ascites, bilateral pelvic and inguinal adenopathy, with retroperitoneal adenopathy extending to pelvic brim. 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 Recommendation was to begin treatment with carboplatin taxol, with reevaluation after 3 cycles for possible interval debulking surgery. Day 1 cycle 1 dose dense carbo taxol was given on 12-27-15, then missed day 8 cycle 1 due to lack of IV access. ANC was 0.1 on day 14 (01-09-16) with granix given x3 and day 15 delayed another week. She received day 15 cycle 1 on 01-17-16 after PICC placed that day. She completed cycle 3 on 02-28-16. CT CAP 03-02-16 showed resolution of ascites and significant improvement in peritoneal disease and adenopathy. She had interval robotic TAH BSO omentectoy and right inguinal lymphadenectomy on 03-24-16, which was R0 debulking. Intraoperatively there appeared to be minimal residual peritoneal and omental disease, with enlarged right iliac node. Pathology [phone] high grade serous carcinoma primary left ovary where residual focal involvement with extensive fibrosis, and focal involvement of omentum also with extensive fibrosis, external iliac node and peritoneum. Chemo resumed with dose dense carbo taxol day 1 cycle 4 on 04-24-16, treated with some missed days and gCSF support thru day 8 cycle 6 on 06-19-16. Treatment complicated by cytopenias and increased  peripheral neuropathy.  Genetics counseling planned 07-07-16.  She has history of left breast cancer 2005 treated in [place] with lumpectomy, chemotherapy and radiation, none of those records have been  obtained.   Objective:  Vital signs in last 24 hours: 175 lbs up 1, 98/57, 80 regular, 18 not labored RA, 98.2, 100%  Alert, oriented and appropriate. Expressive speech difficulty as baseline, but able to communicate given time and assistance from family.  In [facility] as is usual at office  Alopecia  HEENT:PERRL, sclerae not icteric. Oral mucosa moist without lesions, posterior pharynx dull erythema bilaterally no exudate. Mucous membranes more pale.  Neck supple. No JVD.  Lymphatics:no cervical,supraclavicular adenopathy Resp: clear to auscultation bilaterally and normal percussion bilaterally Cardio: regular rate and rhythm. No gallop. GI: soft, nontender, not distended, no mass or organomegaly. Some bowel sounds. Surgical incision not remarkable. Musculoskeletal/ Extremities: without pitting edema, cords, tenderness Neuro:  peripheral neuropathy left fingers/ thumb and sole left foot. Right hemiparesis residual from CVA. Expressive speech difficulty. PSYCH appropriate mood and affect Skin without rash, ecchymosis, petechiae Portacath-without erythema or tenderness  Lab Results:  Results for orders placed or performed in visit on 06/19/16  CBC with Differential  Result Value Ref Range   WBC 6.7 3.9 - 10.3 10e3/uL   NEUT# 5.7 1.5 - 6.5 10e3/uL   HGB 9.5 (L) 11.6 - 15.9 g/dL   HCT 27.9 (L) 34.8 - 46.6 %   Platelets 294 145 - 400 10e3/uL   MCV 94.3 79.5 - 101.0 fL   MCH 32.1 25.1 - 34.0 pg   MCHC 34.1 31.5 - 36.0 g/dL   RBC 2.96 (L) 3.70 - 5.45 10e6/uL   RDW 16.1 (H) 11.2 - 14.5 %   lymph# 0.9 0.9 - 3.3 10e3/uL   MONO# 0.1 0.1 - 0.9 10e3/uL   Eosinophils Absolute 0.0 0.0 - 0.5 10e3/uL   Basophils Absolute 0.0 0.0 - 0.1 10e3/uL   NEUT% 86.1 (H) 38.4 - 76.8 %   LYMPH% 12.9 (L) 14.0 - 49.7 %   MONO% 0.9 0.0 - 14.0 %   EOS% 0.0 0.0 - 7.0 %   BASO% 0.1 0.0 - 2.0 %  Comprehensive metabolic panel  Result Value Ref Range   Sodium 137 136 - 145 mEq/L   Potassium 4.1 3.5 - 5.1 mEq/L    Chloride 104 98 - 109 mEq/L   CO2 22 22 - 29 mEq/L   Glucose 198 (H) 70 - 140 mg/dl   BUN 15.8 7.0 - 26.0 mg/dL   Creatinine 0.8 0.6 - 1.1 mg/dL   Total Bilirubin 0.77 0.20 - 1.20 mg/dL   Alkaline Phosphatase 124 40 - 150 U/L   AST 20 5 - 34 U/L   ALT 30 0 - 55 U/L   Total Protein 6.9 6.4 - 8.3 g/dL   Albumin 3.0 (L) 3.5 - 5.0 g/dL   Calcium 9.4 8.4 - 10.4 mg/dL   Anion Gap 12 (H) 3 - 11 mEq/L   EGFR >90 >90 ml/min/1.73 m2     Studies/Results:  No results found. CT AP ordered for ~ Aug 17  Medications: I have reviewed the patient's current medications. She has been refusing oral iron at home due to constipation concerns, but agrees to take this now, with increased miralax/ stool softeners if needed.   DISCUSSION Counts too low for planned day 15 cycle 6 dose dense carbo taxol today, and with increased neuropathy will stop this treatment now. She will be set up for CT AP shortly prior  to follow up with Dr Denman George in August. WIll keep [facility] for now due to extremely difficult IV access, flush every 6-8 weeks when not otherwise used (use with CT). She will take iron regularly now and will have repeat labs from [facility] prior to CT same day, PAC to be left accessed for that scan (deaccess in radiology after scan).   MD discussed directly with gyn onc schedulers now, in order to coordinate time of CT. Will draw labs from [facility] at [facility] and leave needle in prior to CT same day, then [facility] can be deaccessed in radiology after scan.  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 Additional chemo given thru day 8 cycle 6 on 06-19-16.  Genetics counseling planned 07-07-16. Will repeat scans prior to follow up with Dr Denman George in August. I will see her in Sept or sooner if needed, to follow up recovery from chemo including labs. 2. Chemo neutropenia granix support necessary thru treatment.  Patient dislikes shots and with no additional chemo now, expect ANC to increase over next week or so without granix today. 3.chemo peripheral neuropathy: situation complicated by right hemiparesis from CVA. WIll not give last planned taxol. 4.chemo anemia:  hemoglobin lower today and symptomatic with exertion, but has not been taking oral iron despite that recommendation. She agrees to use this with increased laxatives/ stool softeners now. They will call if more symptomatic prior to next lab evaluation 5.  PAC in, very difficult peripheral IV access so best to leave this after chemo completes in case needed for additonal treatment, at least next 6-12 months. WIll need to flush every 6-8 weeks when not used. 6.CVA 07-2015 with residual right hemiparesis and expressive aphasia. Has resumed outpatient PT/ OT/ speech therapy by [facility], helping. Followed by neurology in [facility]. History of TIAs 2010.  7.atrial fibrillation diagnosed ~ 2011, on Eliquis; ASA not listed on meds. Now established with Dr Valetta Fuller [facility]. No chemo thrombocytopenia. Hx HTNB and family history MIs in sisters ages 65 and 35, and in brother. 8.Left breast cancer 2005 diagnosed and treated in [place] with lumpectomy and presumably axillary node evaluation, combination chemotherapy (drugs not known) via peripheral veins, local radiation. Up to date on mammograms, done 10-18-15 at [facility].  9.hx HTN 10.Constipation: Improved with increased laxatives. Colon polyps at colonoscopy by Dr Benson Norway 2015. Follow up not specified on the report in EMR 11.post appendectomy, BTL 12..information on Advance Directives has been given    All questions answered and patient/ family pleased with plan. Time spent 25 min including >50% counseling and coordination of care. Message to collaborative RN re timing of labs from [facility] prior to CT, as CT to be scheduled thru radiology. Route PCP, cc Drs Dellis Filbert and Powers.   Evlyn Clines, MD    06/25/2016, 8:43 AM

## 2016-06-26 ENCOUNTER — Ambulatory Visit: Payer: Medicare Other

## 2016-06-26 ENCOUNTER — Other Ambulatory Visit: Payer: Medicare Other

## 2016-06-27 ENCOUNTER — Ambulatory Visit: Payer: Medicare Other

## 2016-06-30 ENCOUNTER — Other Ambulatory Visit: Payer: Self-pay | Admitting: Oncology

## 2016-06-30 DIAGNOSIS — I482 Chronic atrial fibrillation: Secondary | ICD-10-CM | POA: Diagnosis not present

## 2016-06-30 DIAGNOSIS — C562 Malignant neoplasm of left ovary: Principal | ICD-10-CM

## 2016-06-30 DIAGNOSIS — I4891 Unspecified atrial fibrillation: Secondary | ICD-10-CM | POA: Diagnosis not present

## 2016-06-30 DIAGNOSIS — C561 Malignant neoplasm of right ovary: Secondary | ICD-10-CM

## 2016-06-30 DIAGNOSIS — C563 Malignant neoplasm of bilateral ovaries: Secondary | ICD-10-CM

## 2016-06-30 DIAGNOSIS — I10 Essential (primary) hypertension: Secondary | ICD-10-CM | POA: Diagnosis not present

## 2016-06-30 DIAGNOSIS — I63512 Cerebral infarction due to unspecified occlusion or stenosis of left middle cerebral artery: Secondary | ICD-10-CM | POA: Diagnosis not present

## 2016-07-02 DIAGNOSIS — I69351 Hemiplegia and hemiparesis following cerebral infarction affecting right dominant side: Secondary | ICD-10-CM | POA: Diagnosis not present

## 2016-07-02 DIAGNOSIS — C50919 Malignant neoplasm of unspecified site of unspecified female breast: Secondary | ICD-10-CM | POA: Diagnosis not present

## 2016-07-02 DIAGNOSIS — K5900 Constipation, unspecified: Secondary | ICD-10-CM | POA: Diagnosis not present

## 2016-07-02 DIAGNOSIS — I69390 Apraxia following cerebral infarction: Secondary | ICD-10-CM | POA: Diagnosis not present

## 2016-07-02 DIAGNOSIS — F329 Major depressive disorder, single episode, unspecified: Secondary | ICD-10-CM | POA: Diagnosis not present

## 2016-07-02 DIAGNOSIS — I69320 Aphasia following cerebral infarction: Secondary | ICD-10-CM | POA: Diagnosis not present

## 2016-07-03 DIAGNOSIS — I4891 Unspecified atrial fibrillation: Secondary | ICD-10-CM | POA: Diagnosis not present

## 2016-07-03 DIAGNOSIS — I10 Essential (primary) hypertension: Secondary | ICD-10-CM | POA: Diagnosis not present

## 2016-07-03 DIAGNOSIS — C50919 Malignant neoplasm of unspecified site of unspecified female breast: Secondary | ICD-10-CM | POA: Diagnosis not present

## 2016-07-03 DIAGNOSIS — I69320 Aphasia following cerebral infarction: Secondary | ICD-10-CM | POA: Diagnosis not present

## 2016-07-03 DIAGNOSIS — F419 Anxiety disorder, unspecified: Secondary | ICD-10-CM | POA: Diagnosis not present

## 2016-07-03 DIAGNOSIS — F329 Major depressive disorder, single episode, unspecified: Secondary | ICD-10-CM | POA: Diagnosis not present

## 2016-07-03 DIAGNOSIS — K5900 Constipation, unspecified: Secondary | ICD-10-CM | POA: Diagnosis not present

## 2016-07-03 DIAGNOSIS — I69351 Hemiplegia and hemiparesis following cerebral infarction affecting right dominant side: Secondary | ICD-10-CM | POA: Diagnosis not present

## 2016-07-03 DIAGNOSIS — I69390 Apraxia following cerebral infarction: Secondary | ICD-10-CM | POA: Diagnosis not present

## 2016-07-07 ENCOUNTER — Other Ambulatory Visit: Payer: Medicare Other

## 2016-07-07 ENCOUNTER — Ambulatory Visit (HOSPITAL_BASED_OUTPATIENT_CLINIC_OR_DEPARTMENT_OTHER): Payer: Medicare Other | Admitting: Genetic Counselor

## 2016-07-07 DIAGNOSIS — C563 Malignant neoplasm of bilateral ovaries: Secondary | ICD-10-CM

## 2016-07-07 DIAGNOSIS — Z853 Personal history of malignant neoplasm of breast: Secondary | ICD-10-CM | POA: Diagnosis not present

## 2016-07-07 DIAGNOSIS — Z315 Encounter for genetic counseling: Secondary | ICD-10-CM

## 2016-07-07 DIAGNOSIS — Z8543 Personal history of malignant neoplasm of ovary: Secondary | ICD-10-CM | POA: Diagnosis not present

## 2016-07-07 DIAGNOSIS — C562 Malignant neoplasm of left ovary: Secondary | ICD-10-CM | POA: Diagnosis present

## 2016-07-07 DIAGNOSIS — C561 Malignant neoplasm of right ovary: Secondary | ICD-10-CM

## 2016-07-08 ENCOUNTER — Encounter: Payer: Self-pay | Admitting: Genetic Counselor

## 2016-07-08 NOTE — Progress Notes (Signed)
REFERRING PROVIDER: Evlyn Clines, MD  PRIMARY PROVIDER:  Helane Rima, MD  PRIMARY REASON FOR VISIT:  1. Ovarian cancer, bilateral ([place])   2. History of left breast cancer      HISTORY OF PRESENT ILLNESS:   Brenda Cunningham, a 65 y.o. female, was seen for a [place] cancer genetics consultation at the request of Dr. Marko Plume due to a personal history of ovarian and breast cancers.  Brenda Cunningham presents to clinic today to discuss the possibility of a hereditary predisposition to cancer, genetic testing, and to further clarify her future cancer risks, as well as potential cancer risks for family members.   In October 2006, at the age of 40, Brenda Cunningham was diagnosed with cancer of the left breast. This was reportedly treated with left breast lumpectomy, radiation, and chemotherapy, while [name] was in [place].  More recently, in January 2017 at the age of 69, Brenda Cunningham was diagnosed with serous ovarian carcinoma.  This was treated with TAH-BSO in April 2017.  Genetic test results will help inform Brenda Cunningham's further treatment and screening plan.  HORMONAL RISK FACTORS:  Menarche was at age 66.  First live birth at age 106-15.  OCP use  "for awhile"  Hysterectomy: yes.  Menopausal status: postmenopausal.  HRT use: 0 years. Colonoscopy: yes; Most recent colonoscopy in 08/2014 found six tubular adenomas in the transverse or ascending colon and one hyperplastic polyp in the sigmoid colon.  Brenda Cunningham reports that this was her first colonoscopy. Mammogram within the last year: yes. Number of breast biopsies: 1. Up to date with pelvic exams:  n/a. Any excessive radiation exposure/other exposures in the past:  History of radiation with her previous breast cancer; reports some history of secondhand smoke exposure  Past Medical History:  Diagnosis Date  . A-fib ([place])   . Anxiety   . Arthritis   . Breast cancer ([place]) 2008   s/p lumpectomy, chemo and radiation therapy  .  Complication of anesthesia    DIFFICULTY WAKING UP FROM ANESTHESIA  . Hyperlipidemia   . Hypertension   . Stroke ([place]) 07/29/2015  . Wears glasses     Past Surgical History:  Procedure Laterality Date  . APPENDECTOMY    . BREAST LUMPECTOMY  2008   left  . COLONOSCOPY  2004  . DEBULKING N/A 03/24/2016   Procedure: RADICAL TUMOR DEBULKING;  Surgeon: Everitt Amber, MD;  Location: [facility];  Service: Gynecology;  Laterality: N/A;  . OMENTECTOMY N/A 03/24/2016   Procedure: OMENTECTOMY;  Surgeon: Everitt Amber, MD;  Location: [facility];  Service: Gynecology;  Laterality: N/A;  . ROBOTIC ASSISTED TOTAL HYSTERECTOMY WITH BILATERAL SALPINGO OOPHERECTOMY Bilateral 03/24/2016   Procedure: XI ROBOTIC ASSISTED TOTA LAPAROSCOPIC  HYSTERECTOMY WITH BILATERAL SALPINGO OOPHORECTOMY;  Surgeon: Everitt Amber, MD;  Location: [facility];  Service: Gynecology;  Laterality: Bilateral;  . TUBAL LIGATION      Social History   Social History  . Marital status: Single    Spouse name: N/A  . Number of children: N/A  . Years of education: N/A   Social History Main Topics  . Smoking status: Never Smoker  . Smokeless tobacco: Never Used  . Alcohol use Yes     Comment: occ beer or glass of wine  . Drug use: No  . Sexual activity: Not Asked   Other Topics Concern  . None   Social History Narrative   Lives with her female friend, Mina Marble, walks for exercise.  Originally from [place].  FAMILY HISTORY:  We obtained a detailed, 4-generation family history.  Significant diagnoses are listed below: Family History  Problem Relation Age of Onset  . Cirrhosis Father     +heavy EtOH  . Diabetes Maternal Uncle   . Other Daughter     hx benign breast bx    Brenda Cunningham has two daughters and four sons, ages 65-52.  None of her children or grandchildren have ever been diagnosed with cancer.  She has four maternal half-sisters and one maternal half-brother.  One half-sister died in a car accident at the age of 34.  The other  siblings are between their 40s-60s and have not had cancer.  Brenda Cunningham also reports no history of cancer for nieces/nephews.    Brenda Cunningham's mother had one full brother who passed way related to diabetes at an older age.  He had one daughter for whom Brenda Cunningham and her family have limited information.  Brenda Cunningham's maternal grandparents have passed away, but she has no information for them or for any other more distant maternal relatives.  Brenda Cunningham's father passed away in his 30s due to liver cirrhosis in the setting of alcohol abuse.  Brenda Cunningham has no further information for any other paternal relatives.  Brenda Cunningham is unaware of any prior family history of genetic testing for hereditary cancer.  Patient's maternal and paternal ancestors are of African American descent. There is no reported Ashkenazi Jewish ancestry. There is no known consanguinity.  GENETIC COUNSELING ASSESSMENT: Brenda Cunningham is a 65 y.o. female with a personal history of breast and ovarian cancer which is somewhat suggestive of hereditary breast and ovarian cancer syndrome and predisposition to cancer. We, therefore, discussed and recommended the following at today's visit.   DISCUSSION: We reviewed the characteristics, features and inheritance patterns of hereditary cancer syndromes, particularly those caused by mutations within the BRCA1/2 genes. We also discussed genetic testing, including the appropriate family members to test, the process of testing, insurance coverage and turn-around-time for results. We discussed the implications of a negative, positive and/or variant of uncertain significant result. We recommended [name] pursue genetic testing for the 20-gene Breast/Ovarian Cancer Panel with MSH2 Exons 1-7 Inversion Analysis through [facility] [employer] [facility].  The Breast/Ovarian Cancer Panel offered by [facility] Brenda Pigeon, MD) includes sequencing and deletion/duplication analysis for the following 19  genes:  ATM, BARD1, BRCA1, BRCA2, BRIP1, CDH1, CHEK2, FANCC, MLH1, MSH2, MSH6, NBN, PALB2, PMS2, PTEN, RAD51C, RAD51D, TP53, and XRCC2.  This panel also includes deletion/duplication analysis (without sequencing) for one gene, EPCAM.   Based on Ms. Kingry's personal history of cancer, she meets medical criteria for genetic testing. Despite that she meets criteria, she may still have an out of pocket cost. We discussed that if her out of pocket cost for testing is over $100, the laboratory will call and confirm whether she wants to proceed with testing.  If the out of pocket cost of testing is less than $100 she will be billed by the genetic testing laboratory.   PLAN: After considering the risks, benefits, and limitations, Ms. Kizziah  provided informed consent to pursue genetic testing and the blood sample was sent to [facility] for analysis of the 20-gene Breast/Ovarian Cancer Panel with MSH2 Exons 1-7 Inversion Analysis. Results should be available within approximately 2-3 weeks' time, at which point they will be disclosed by telephone to Ms. Scrima, as will any additional recommendations warranted by these results. Ms. Ginzburg will receive a summary of her genetic counseling  visit and a copy of her results once available. This information will also be available in Epic. We encouraged Ms. Dockter to remain in contact with cancer genetics annually so that we can continuously update the family history and inform her of any changes in cancer genetics and testing that may be of benefit for her family. Ms. Spitzley questions were answered to her satisfaction today. Our contact information was provided should additional questions or concerns arise.  Thank you for the referral and allowing [place] to share in the care of your patient.   Jeanine Luz, MS, [facility] [place] Certified Genetic Counselor [email or web address] Phone: [phone]  The patient was seen for a total of 60 minutes in face-to-face  genetic counseling.  This patient was discussed with Drs. Magrinat, Lindi Adie and/or Burr Medico who agrees with the above.    _______________________________________________________________________ For Office Staff:  Number of people involved in session: 5 Was an Intern/ student involved with case: no

## 2016-07-09 DIAGNOSIS — I69320 Aphasia following cerebral infarction: Secondary | ICD-10-CM | POA: Diagnosis not present

## 2016-07-09 DIAGNOSIS — C50919 Malignant neoplasm of unspecified site of unspecified female breast: Secondary | ICD-10-CM | POA: Diagnosis not present

## 2016-07-09 DIAGNOSIS — K5900 Constipation, unspecified: Secondary | ICD-10-CM | POA: Diagnosis not present

## 2016-07-09 DIAGNOSIS — I69390 Apraxia following cerebral infarction: Secondary | ICD-10-CM | POA: Diagnosis not present

## 2016-07-09 DIAGNOSIS — F329 Major depressive disorder, single episode, unspecified: Secondary | ICD-10-CM | POA: Diagnosis not present

## 2016-07-09 DIAGNOSIS — I69351 Hemiplegia and hemiparesis following cerebral infarction affecting right dominant side: Secondary | ICD-10-CM | POA: Diagnosis not present

## 2016-07-10 ENCOUNTER — Telehealth: Payer: Self-pay

## 2016-07-10 NOTE — Telephone Encounter (Signed)
LM for Allyson that it is fine with Dr. Marko Plume to resume speech therapy.

## 2016-07-10 NOTE — Telephone Encounter (Signed)
Told Ms Perkin's  Daughter Kenney Houseman  the date and time of the lab, flush, and CT scan appointment  On 07-23-16. Ms. Mariconda has contrast at home.  She is NPO ater 8 am that day.  Drink 1 bottle of contrast at 1000 and 1 at 1100. Told [name] that the [facility] needs to be accessed with a power port needle for scan.  Letting her know this to verify with nurse prior to access. Dr. Marko Plume following up labs post treatment. [name] verbalized understanding.

## 2016-07-10 NOTE — Telephone Encounter (Signed)
Brenda Cunningham calling to get an order to resume speech pathology as Brenda Cunningham took a break to finish her chemotherapy. A nurse went to the home to evaluate Brenda Cunningham. Brenda Cunningham is feeling better and abe to resume therapy

## 2016-07-10 NOTE — Telephone Encounter (Signed)
-----   Message from Gordy Levan, MD sent at 06/26/2016  8:08 AM EDT ----- RN: please check time of PAC access/ lab at [facility] 8-17, needs to be piror to CT that day. CT is not scheduled yet.  Should draw lab from [facility], leave PAC accessed for CT, then deaccess PAC in CT that day Need to be sure to see those labs also.  Juluis Rainier I have sent message to [place] to see if in future I can make requests for follow up scheduling checks directly to schedulers instead of to RN)  Thank you

## 2016-07-22 ENCOUNTER — Telehealth: Payer: Self-pay | Admitting: Genetic Counselor

## 2016-07-22 DIAGNOSIS — F329 Major depressive disorder, single episode, unspecified: Secondary | ICD-10-CM | POA: Diagnosis not present

## 2016-07-22 DIAGNOSIS — I69351 Hemiplegia and hemiparesis following cerebral infarction affecting right dominant side: Secondary | ICD-10-CM | POA: Diagnosis not present

## 2016-07-22 DIAGNOSIS — C50919 Malignant neoplasm of unspecified site of unspecified female breast: Secondary | ICD-10-CM | POA: Diagnosis not present

## 2016-07-22 DIAGNOSIS — I69320 Aphasia following cerebral infarction: Secondary | ICD-10-CM | POA: Diagnosis not present

## 2016-07-22 DIAGNOSIS — K5900 Constipation, unspecified: Secondary | ICD-10-CM | POA: Diagnosis not present

## 2016-07-22 DIAGNOSIS — I69390 Apraxia following cerebral infarction: Secondary | ICD-10-CM | POA: Diagnosis not present

## 2016-07-22 NOTE — Telephone Encounter (Signed)
Discussed with Brenda Cunningham and Brenda Cunningham that Brenda genetic test results were negative for pathogenic mutations within any of 20 genes on the Breast/Ovarian Cancer Panel.  Additionally, no uncertain changes were found.  Discussed that we still do not have an explanation for Brenda history of breast and ovarian cancer, but that most likely this is reassuring, since most cancers are not genetic.  She should continue to follow Brenda doctors recommendations for future cancer screening.  Brenda daughters and sisters are at a somewhat increased risk for breast cancer, simply due to Brenda history.  They should make their doctors' aware of this history, and should continue having annual mammogram screening.  Brenda Cunningham is happy to receive this news.  She would like a copy of Brenda test result mailed to Brenda, and I am happy to do that.  She knows she is welcome to call with any questions.

## 2016-07-23 ENCOUNTER — Ambulatory Visit (HOSPITAL_COMMUNITY)
Admission: RE | Admit: 2016-07-23 | Discharge: 2016-07-23 | Disposition: A | Discharge status: Home / Self Care | Payer: Medicare Other | Source: Ambulatory Visit | Attending: Oncology | Admitting: Oncology

## 2016-07-23 ENCOUNTER — Other Ambulatory Visit (HOSPITAL_BASED_OUTPATIENT_CLINIC_OR_DEPARTMENT_OTHER): Payer: Medicare Other

## 2016-07-23 ENCOUNTER — Encounter (HOSPITAL_COMMUNITY): Payer: Self-pay

## 2016-07-23 ENCOUNTER — Ambulatory Visit: Payer: Medicare Other

## 2016-07-23 ENCOUNTER — Ambulatory Visit: Payer: Self-pay | Admitting: Genetic Counselor

## 2016-07-23 DIAGNOSIS — C563 Malignant neoplasm of bilateral ovaries: Secondary | ICD-10-CM

## 2016-07-23 DIAGNOSIS — C561 Malignant neoplasm of right ovary: Secondary | ICD-10-CM

## 2016-07-23 DIAGNOSIS — Z853 Personal history of malignant neoplasm of breast: Secondary | ICD-10-CM

## 2016-07-23 DIAGNOSIS — C562 Malignant neoplasm of left ovary: Secondary | ICD-10-CM | POA: Diagnosis not present

## 2016-07-23 DIAGNOSIS — Z90710 Acquired absence of both cervix and uterus: Secondary | ICD-10-CM | POA: Insufficient documentation

## 2016-07-23 DIAGNOSIS — Z1379 Encounter for other screening for genetic and chromosomal anomalies: Secondary | ICD-10-CM

## 2016-07-23 DIAGNOSIS — Z90722 Acquired absence of ovaries, bilateral: Secondary | ICD-10-CM | POA: Insufficient documentation

## 2016-07-23 DIAGNOSIS — I517 Cardiomegaly: Secondary | ICD-10-CM | POA: Insufficient documentation

## 2016-07-23 DIAGNOSIS — Z9079 Acquired absence of other genital organ(s): Secondary | ICD-10-CM | POA: Diagnosis not present

## 2016-07-23 DIAGNOSIS — R1909 Other intra-abdominal and pelvic swelling, mass and lump: Secondary | ICD-10-CM | POA: Insufficient documentation

## 2016-07-23 DIAGNOSIS — Z95828 Presence of other vascular implants and grafts: Secondary | ICD-10-CM

## 2016-07-23 DIAGNOSIS — I700 Atherosclerosis of aorta: Secondary | ICD-10-CM | POA: Diagnosis not present

## 2016-07-23 DIAGNOSIS — K7689 Other specified diseases of liver: Secondary | ICD-10-CM | POA: Diagnosis not present

## 2016-07-23 DIAGNOSIS — Z9221 Personal history of antineoplastic chemotherapy: Secondary | ICD-10-CM | POA: Insufficient documentation

## 2016-07-23 DIAGNOSIS — K769 Liver disease, unspecified: Secondary | ICD-10-CM | POA: Diagnosis not present

## 2016-07-23 LAB — COMPREHENSIVE METABOLIC PANEL
ALT: 20 U/L (ref 0–55)
AST: 22 U/L (ref 5–34)
Albumin: 3.2 g/dL — ABNORMAL LOW (ref 3.5–5.0)
Alkaline Phosphatase: 100 U/L (ref 40–150)
Anion Gap: 8 mEq/L (ref 3–11)
BUN: 15 mg/dL (ref 7.0–26.0)
CO2: 28 mEq/L (ref 22–29)
Calcium: 9.8 mg/dL (ref 8.4–10.4)
Chloride: 107 mEq/L (ref 98–109)
Creatinine: 0.7 mg/dL (ref 0.6–1.1)
EGFR: >90 mL/min/{1.73_m2} (ref >90)
Glucose: 90 mg/dl (ref 70–140)
Potassium: 3.8 mEq/L (ref 3.5–5.1)
Sodium: 142 mEq/L (ref 136–145)
Total Bilirubin: 1.32 mg/dL — ABNORMAL HIGH (ref 0.20–1.20)
Total Protein: 6.6 g/dL (ref 6.4–8.3)

## 2016-07-23 LAB — CBC WITH DIFFERENTIAL/PLATELET
BASO%: 0.5 % (ref 0.0–2.0)
Basophils Absolute: 0 10*3/uL (ref 0.0–0.1)
EOS%: 4.5 % (ref 0.0–7.0)
Eosinophils Absolute: 0.2 10*3/uL (ref 0.0–0.5)
HCT: 32 % — ABNORMAL LOW (ref 34.8–46.6)
HGB: 10.5 g/dL — ABNORMAL LOW (ref 11.6–15.9)
LYMPH%: 43 % (ref 14.0–49.7)
MCH: 32.5 pg (ref 25.1–34.0)
MCHC: 32.8 g/dL (ref 31.5–36.0)
MCV: 99.1 fL (ref 79.5–101.0)
MONO#: 0.5 10*3/uL (ref 0.1–0.9)
MONO%: 11.8 % (ref 0.0–14.0)
NEUT#: 1.7 10*3/uL (ref 1.5–6.5)
NEUT%: 40.2 % (ref 38.4–76.8)
Platelets: 219 10*3/uL (ref 145–400)
RBC: 3.23 10*6/uL — ABNORMAL LOW (ref 3.70–5.45)
RDW: 20.6 % — ABNORMAL HIGH (ref 11.2–14.5)
WBC: 4.2 10*3/uL (ref 3.9–10.3)
lymph#: 1.8 10*3/uL (ref 0.9–3.3)

## 2016-07-23 MED ORDER — SODIUM CHLORIDE 0.9 % IJ SOLN
10.0000 mL | INTRAMUSCULAR | Status: DC | PRN
  Administered 2016-07-23: 10 mL via INTRAVENOUS
  Filled 2016-07-23: qty 10

## 2016-07-23 MED ORDER — IOPAMIDOL (ISOVUE-300) INJECTION 61%
100.0000 mL | Freq: Once | INTRAVENOUS | Status: AC | PRN
  Administered 2016-07-23: 100 mL via INTRAVENOUS

## 2016-07-24 LAB — CA 125: Cancer Antigen (CA) 125: 12.3 U/mL (ref 0.0–38.1)

## 2016-07-27 ENCOUNTER — Encounter: Payer: Self-pay | Admitting: Gynecologic Oncology

## 2016-07-27 ENCOUNTER — Ambulatory Visit: Payer: Medicare Other | Attending: Gynecologic Oncology | Admitting: Gynecologic Oncology

## 2016-07-27 VITALS — BP 128/84 | HR 71 | Temp 98.8°F | Resp 18 | Wt 182.4 lb

## 2016-07-27 DIAGNOSIS — Z8673 Personal history of transient ischemic attack (TIA), and cerebral infarction without residual deficits: Secondary | ICD-10-CM | POA: Diagnosis not present

## 2016-07-27 DIAGNOSIS — C562 Malignant neoplasm of left ovary: Secondary | ICD-10-CM

## 2016-07-27 DIAGNOSIS — C563 Malignant neoplasm of bilateral ovaries: Secondary | ICD-10-CM

## 2016-07-27 DIAGNOSIS — Z91040 Latex allergy status: Secondary | ICD-10-CM | POA: Insufficient documentation

## 2016-07-27 DIAGNOSIS — Z923 Personal history of irradiation: Secondary | ICD-10-CM | POA: Insufficient documentation

## 2016-07-27 DIAGNOSIS — M1990 Unspecified osteoarthritis, unspecified site: Secondary | ICD-10-CM | POA: Insufficient documentation

## 2016-07-27 DIAGNOSIS — Z8379 Family history of other diseases of the digestive system: Secondary | ICD-10-CM | POA: Diagnosis not present

## 2016-07-27 DIAGNOSIS — C800 Disseminated malignant neoplasm, unspecified: Secondary | ICD-10-CM | POA: Diagnosis not present

## 2016-07-27 DIAGNOSIS — Z90710 Acquired absence of both cervix and uterus: Secondary | ICD-10-CM | POA: Insufficient documentation

## 2016-07-27 DIAGNOSIS — Z833 Family history of diabetes mellitus: Secondary | ICD-10-CM | POA: Diagnosis not present

## 2016-07-27 DIAGNOSIS — Z7982 Long term (current) use of aspirin: Secondary | ICD-10-CM | POA: Insufficient documentation

## 2016-07-27 DIAGNOSIS — C561 Malignant neoplasm of right ovary: Secondary | ICD-10-CM

## 2016-07-27 DIAGNOSIS — Z9221 Personal history of antineoplastic chemotherapy: Secondary | ICD-10-CM | POA: Insufficient documentation

## 2016-07-27 DIAGNOSIS — I4891 Unspecified atrial fibrillation: Secondary | ICD-10-CM | POA: Diagnosis not present

## 2016-07-27 DIAGNOSIS — Z885 Allergy status to narcotic agent status: Secondary | ICD-10-CM | POA: Diagnosis not present

## 2016-07-27 DIAGNOSIS — Z7901 Long term (current) use of anticoagulants: Secondary | ICD-10-CM | POA: Diagnosis not present

## 2016-07-27 DIAGNOSIS — Z853 Personal history of malignant neoplasm of breast: Secondary | ICD-10-CM | POA: Diagnosis not present

## 2016-07-27 DIAGNOSIS — Z90722 Acquired absence of ovaries, bilateral: Secondary | ICD-10-CM | POA: Diagnosis not present

## 2016-07-27 DIAGNOSIS — F419 Anxiety disorder, unspecified: Secondary | ICD-10-CM | POA: Diagnosis not present

## 2016-07-27 DIAGNOSIS — I10 Essential (primary) hypertension: Secondary | ICD-10-CM | POA: Insufficient documentation

## 2016-07-27 DIAGNOSIS — E785 Hyperlipidemia, unspecified: Secondary | ICD-10-CM | POA: Diagnosis not present

## 2016-07-27 DIAGNOSIS — Z9889 Other specified postprocedural states: Secondary | ICD-10-CM | POA: Insufficient documentation

## 2016-07-27 DIAGNOSIS — C569 Malignant neoplasm of unspecified ovary: Secondary | ICD-10-CM | POA: Insufficient documentation

## 2016-07-27 NOTE — Progress Notes (Signed)
Ovarian Cancer Follow-up Note  Consult was initially requested by Dr. Venora Maples for the evaluation of Brenda Cunningham 65 y.o. female with presumed metastatic ovarian cancer  CC:  Chief Complaint  Patient presents with  . ovarian cancer follow-up    Assessment/Plan:  [name]  is a 65 y.o.  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 S/p interval debulking (optimal) with robotic assisted total hysterectomy, BSO, omentectomy and right external iliac lymphadenectomy on 03/24/16.  Partial response to therapy on imaging - 4cm mass in region of external iliac node that had been resected in April, 2017, though CA 125 has normalized to 12.  We reviewed the CT films with the patient and her family and discussed 2 options for treatment at this time: 1/ stopping therapy until rise in CA 125 or increasing size of right external iliac lesion. It is asymptomatic and it is reasonable to withhold additional therapy until the lesion demonstrates symptoms/growth. 2/ continuing with cytotoxic therapy but changing to a regimen suited for platinum resistance such as gemcitabine, topotecan, or consideration for Doxil (though caution with this given vascular history and cardiomegaly on regent imaging). Weekly taxol may be difficult to give in this patient who has neuropathy.  The patient and her family are electing for expectant management at this time.  We will repeat CT imaging and CA 125 in 3 months and if there is interval growth in the mass and elevation of CA 125 we will consider chemotherapy (as per above options).  I would not recommend re-resection in this patient given the natural history of her disease and her high risk for morbidity.  She is at high risk for occlusive vascular event given the apparent compression of the right pelvic mass on the iliac vessels. Patient is already on  eloquis.  I will see her back in 3 months.    HPI: Brenda Cunningham is a 65 year old G6P6 who is seen in consultation at the request of ED physician, Dr Jola Schmidt for carcinomatosis. The patient reports having a stroke in August 2016 that resulted in expressive aphasia and right upper extremity weakness.  She has been treated for that with aspirin and L a close and is undergoing physical therapy and speech pathology.  The patient began developing vague left lower quadrant and hip discomfort and was seen in the emergency room on 11/29/2015. A CT scan of the abdomen and pelvis was ordered and revealed an indistinct uterus secondary to surrounding peritoneal masses. Internal coarse calcifications consistent with hyalinized fibroids. Bilateral ovaries enlarged and heterogeneous measuring up to 4 cm is on the right. Bilateral pelvic and inguinal adenopathy which appears malignant with a left inguinal lymph node measuring 1.8 mm and a right external iliac lymph node measuring 18 mm. The retroperitoneal adenopathy extensive pelvic brim. Innumerable masses clustered in the pelvis with interleukin peritoneal thickening and small ascites. The omentum has a started appearance.  The patient denies early satiety and reports mild and vague abdominal bloating. She has a remote history of breast cancer in 2008 for which she was treated with lumpectomy, chemotherapy, and radiation. She is no family history significant for breast or ovarian cancer. She is otherwise treated for atrial fibrillation and has had a remote history of TIAs in 2010. In 2016 she underwent more significant cerebrovascular event which resulted in expressive aphasia and right upper extremity weakness. She also  has hypertension and arthritis. Her prior surgical history significant for a left breast lumpectomy, appendectomy, tubal ligation. She's had 6 vaginal deliveries in the past.  She denies vaginal bleeding or history of abnormal Pap  smears.  On 12/17/15 she underwent biopsy of the inguinal node which revealed high grade serous carcinoma. She commenced neoadjuvant chemotherapy on 12/27/15 with dose dense carboplatin and paclitaxel. Day 1 of cycle 3 was on 02/14/16.  After cycle 3 she began experiencing profound weakness in both lower extremities and requires maximal assistance at home now with mobility. She was evaluated by her neurologist, Dr Dellis Filbert on 03/03/16 who felt that she may have focal seizure vs syncopal episode. She is scheduled for an EEG on 03/17/16.  CT of the chest abdo and pelvis on 03/02/16 showed excellent response to therapy with resolution of ascites, regression of previously noted peritoneal implants and regression of lymphadenopathy in the abdomen and pelvis. There is continued soft tissue thickening along the perineal surfaces with the largest residual implant being 1.2 x 2.3 cm in the central low anatomic pelvis, and a 1.3 x 0.9 cm lesion in the low left paracolic gutter. The ovaries measure for 0.0 x 2.6 x 4.7 cm on the right and 1.8 x 2.3 x 2.37 m on the left. The uterus is bulky and enlarged with fibroids.  On 02/28/16 CA 125 normalized to 33.6  On 03/24/16 she underwent robotic total hysterectomy, BSO, right iliac lymphadenectomy, ometectomy. Intraop findings included minimal residual peritoneal and omental disease, enlarged right external iliac lymph node. R0 resection with no macroscopic disease on completion. The pathology showed high grade serous carcinoma in the ovaries, (left ovarian primary), omentum, external iliac lymph node and peritoneum.  Interval Hx: She resumed adjuvant chemotherapy on 04/24/16 with carboplatin and paclitaxel for an additional 3 cycles completed on 06/19/16. Her CA 125 normalized during that time to 12 on 07/23/16. Post-treatment CT chest/abdomen/pelvis on 07/23/16 showed residual disease in the right pelvis: 1. Heterogeneously enhancing 4.9 x 3.2 x 4.4 cm mass along the right pelvic  sidewall may represent locally recurrent disease or malignant lymphadenopathy. 2. No other signs of definite metastatic disease noted elsewhere in the abdomen or pelvis. 3. Stable low-attenuation hepatic lesion in the caudate lobe of the liver, which appears to demonstrates some progressive centripetal filling on delayed images. This lesion is presumably benign given its stability compared to prior studies, and is favored to represent a small cavernous hemangioma. 4. Cardiomegaly with biatrial dilatation, which is very severe on the right side.  Since completing therapy she is feeling generally well. She is eating well. She has gained some weight. She has worsening of her gait and lower extremity weakness since being on the chemotherapy (likely an effect of the neuropathy from taxol and her prior history of CVA). She is on PT for this.  Her son reports that her right lower extremity swells intermittently then spontaneously resolves.   Current Meds:  Outpatient Encounter Prescriptions as of 07/27/2016  Medication Sig  . apixaban (ELIQUIS) 5 MG TABS tablet Take 1 tablet (5 mg total) by mouth 2 (two) times daily.  Marland Kitchen aspirin EC 81 MG tablet Take 81 mg by mouth daily.  Marland Kitchen atorvastatin (LIPITOR) 40 MG tablet Take 40 mg by mouth at bedtime.  . ferrous fumarate (HEMOCYTE - 106 MG FE) 325 (106 Fe) MG TABS tablet Take 1 tablet daly on an empty stomach with OJ or Vitamin C  . metoprolol tartrate (LOPRESSOR) 25 MG tablet Take 12.5 mg by  mouth 2 (two) times daily.   . mirtazapine (REMERON) 15 MG tablet Take 15 mg by mouth at bedtime.  . Multiple Vitamins-Minerals (MULTIVITAMIN WITH MINERALS) tablet Take 1 tablet by mouth daily.  Marland Kitchen omega-3 acid ethyl esters (LOVAZA) 1 g capsule Take 1 g by mouth daily.  Marland Kitchen PARoxetine (PAXIL) 20 MG tablet TAKE 1 TABLET BY MOUTH DAILY (Patient taking differently: TAKE 20 MG BY MOUTH DAILY)  . traMADol (ULTRAM) 50 MG tablet Take 2 tablets (100 mg total) by mouth every 12 (twelve)  hours as needed for moderate pain.  . verapamil (CALAN-SR) 240 MG CR tablet Take 1 tablet (240 mg total) by mouth daily.  Marland Kitchen acetaminophen (TYLENOL) 500 MG tablet Take 500-1,000 mg by mouth every 6 (six) hours as needed for moderate pain. Reported on 05/14/2016  . dexamethasone (DECADRON) 4 MG tablet Take 5 tablets =20 mg with food 12 hrs before Taxol Chemotherapy (Patient not taking: Reported on 07/27/2016)  . docusate sodium (COLACE) 100 MG capsule Take 1 capsule (100 mg total) by mouth 2 (two) times daily as needed for mild constipation. (Patient not taking: Reported on 07/27/2016)  . lidocaine-prilocaine (EMLA) cream Apply to Porta-Cath 1-2 hours prior to access as directed. (Patient not taking: Reported on 07/27/2016)  . loratadine (CLARITIN) 10 MG tablet Take 10 mg by mouth daily as needed (Recommended for chemo pain). Reported on 03/16/2016  . LORazepam (ATIVAN) 0.5 MG tablet Place 1 tablet under the tongue or swallow every 6 hours as needed for nausea. Will make you drowsy. (Patient not taking: Reported on 07/27/2016)  . ondansetron (ZOFRAN-ODT) 8 MG disintegrating tablet Take 1 tablet (8 mg total) by mouth every 8 (eight) hours as needed for nausea or vomiting. (Patient not taking: Reported on 07/27/2016)  . prochlorperazine (COMPAZINE) 10 MG tablet Take 1 tablet (10 mg total) by mouth every 6 (six) hours as needed for nausea or vomiting. (Patient not taking: Reported on 07/27/2016)   No facility-administered encounter medications on file as of 07/27/2016.     Allergy:  Allergies  Allergen Reactions  . Codeine Hives and Itching  . Latex     Skin breaks out per patient.    Social Hx:   Social History   Social History  . Marital status: Single    Spouse name: N/A  . Number of children: N/A  . Years of education: N/A   Occupational History  . Not on file.   Social History Main Topics  . Smoking status: Never Smoker  . Smokeless tobacco: Never Used  . Alcohol use Yes     Comment: occ  beer or glass of wine  . Drug use: No  . Sexual activity: Not on file   Other Topics Concern  . Not on file   Social History Narrative   Lives with her female friend, Mina Marble, walks for exercise.  Originally from [place].    Past Surgical Hx:  Past Surgical History:  Procedure Laterality Date  . APPENDECTOMY    . BREAST LUMPECTOMY  2008   left  . COLONOSCOPY  2004  . DEBULKING N/A 03/24/2016   Procedure: RADICAL TUMOR DEBULKING;  Surgeon: Everitt Amber, MD;  Location: [facility];  Service: Gynecology;  Laterality: N/A;  . OMENTECTOMY N/A 03/24/2016   Procedure: OMENTECTOMY;  Surgeon: Everitt Amber, MD;  Location: [facility];  Service: Gynecology;  Laterality: N/A;  . ROBOTIC ASSISTED TOTAL HYSTERECTOMY WITH BILATERAL SALPINGO OOPHERECTOMY Bilateral 03/24/2016   Procedure: XI ROBOTIC ASSISTED TOTA LAPAROSCOPIC  HYSTERECTOMY WITH  BILATERAL SALPINGO OOPHORECTOMY;  Surgeon: Everitt Amber, MD;  Location: [facility];  Service: Gynecology;  Laterality: Bilateral;  . TUBAL LIGATION      Past Medical Hx:  Past Medical History:  Diagnosis Date  . A-fib ([place])   . Anxiety   . Arthritis   . Breast cancer ([place]) 2008   s/p lumpectomy, chemo and radiation therapy  . Complication of anesthesia    DIFFICULTY WAKING UP FROM ANESTHESIA  . Hyperlipidemia   . Hypertension   . Stroke ([place]) 07/29/2015  . Wears glasses     Past Gynecological History:  SVD x 6  No LMP recorded. Patient is postmenopausal.  Family Hx:  Family History  Problem Relation Age of Onset  . Cirrhosis Father     +heavy EtOH  . Diabetes Maternal Uncle   . Other Daughter     hx benign breast bx    Review of Systems:  Constitutional  Feels well,    ENT Normal appearing ears and nares bilaterally Skin/Breast  No rash, sores, jaundice, itching, dryness Cardiovascular  No chest pain, shortness of breath, or edema  Pulmonary  No cough or wheeze.  Gastro Intestinal  No nausea, vomitting, or diarrhoea. No bright red blood per  rectum, no abdominal pain, change in bowel movement, or constipation.  Genito Urinary  No frequency, urgency, dysuria,  Musculo Skeletal  Resolved chest wall discomfort, + right lower extremity edema Neurologic  + weakness, numbness, change in gait - see HPI Psychology  No depression, anxiety, insomnia.   Vitals:  Blood pressure 128/84, pulse 71, temperature 98.8 F (37.1 C), temperature source Oral, resp. rate 18, weight 182 lb 6.4 oz (82.7 kg), SpO2 100 %.  Physical Exam: WD in NAD Neck  Supple NROM, without any enlargements.  Lymph Node Survey No cervical supraclavicular adenopathy. Left inguinal lymph node is no longer palpable Cardiovascular  Pulse normal rate, regularity and rhythm. S1 and S2 normal.  Lungs  Clear to auscultation bilateraly, without wheezes/crackles/rhonchi. Good air movement.  Skin  No rash/lesions/breakdown  Psychiatry  Alert and oriented to person, place, and time  Abdomen  Normoactive bowel sounds, abdomen soft, non-tender and overweight without evidence of hernia. Slightly distended. No masses. Well healed abdominal incisions Back No CVA tenderness Genito Urinary  Vulva/vagina: Normal external female genitalia.  No lesions. No discharge or bleeding.  Bladder/urethra:  No lesions or masses, well supported bladder  Vagina: vaginal cuff smooth. Lower aspect of right pelvic sidewall mass appreciated on deep exam. Immobile.  Cervix: surgically absent .  Uterus: surgically absent.  Adnexa: as above . Rectal  Good tone, no masses no cul de sac nodularity.  Extremities  No bilateral cyanosis, clubbing or edema.   20 minutes of direct face to face counseling time was spent with the patient. This included discussion about prognosis and therapy recommendations.  Donaciano Eva, MD  07/27/2016, 11:32 AM

## 2016-07-27 NOTE — Addendum Note (Signed)
Addended by: Joylene John D on: 07/27/2016 12:01 PM   Modules accepted: Orders

## 2016-07-27 NOTE — Patient Instructions (Signed)
Plan to follow up with Dr. Denman George in November with lab work and a CT scan of the abdomen and pelvis with oral contrast prior to that appointment.

## 2016-07-31 DIAGNOSIS — F329 Major depressive disorder, single episode, unspecified: Secondary | ICD-10-CM | POA: Diagnosis not present

## 2016-07-31 DIAGNOSIS — I69320 Aphasia following cerebral infarction: Secondary | ICD-10-CM | POA: Diagnosis not present

## 2016-07-31 DIAGNOSIS — C50919 Malignant neoplasm of unspecified site of unspecified female breast: Secondary | ICD-10-CM | POA: Diagnosis not present

## 2016-07-31 DIAGNOSIS — K5900 Constipation, unspecified: Secondary | ICD-10-CM | POA: Diagnosis not present

## 2016-07-31 DIAGNOSIS — I69351 Hemiplegia and hemiparesis following cerebral infarction affecting right dominant side: Secondary | ICD-10-CM | POA: Diagnosis not present

## 2016-07-31 DIAGNOSIS — I69390 Apraxia following cerebral infarction: Secondary | ICD-10-CM | POA: Diagnosis not present

## 2016-08-03 ENCOUNTER — Other Ambulatory Visit: Payer: Self-pay

## 2016-08-06 DIAGNOSIS — I69320 Aphasia following cerebral infarction: Secondary | ICD-10-CM | POA: Diagnosis not present

## 2016-08-06 DIAGNOSIS — I69351 Hemiplegia and hemiparesis following cerebral infarction affecting right dominant side: Secondary | ICD-10-CM | POA: Diagnosis not present

## 2016-08-06 DIAGNOSIS — K5900 Constipation, unspecified: Secondary | ICD-10-CM | POA: Diagnosis not present

## 2016-08-06 DIAGNOSIS — C50919 Malignant neoplasm of unspecified site of unspecified female breast: Secondary | ICD-10-CM | POA: Diagnosis not present

## 2016-08-06 DIAGNOSIS — I69390 Apraxia following cerebral infarction: Secondary | ICD-10-CM | POA: Diagnosis not present

## 2016-08-06 DIAGNOSIS — F329 Major depressive disorder, single episode, unspecified: Secondary | ICD-10-CM | POA: Diagnosis not present

## 2016-08-13 ENCOUNTER — Other Ambulatory Visit: Payer: Self-pay | Admitting: Nurse Practitioner

## 2016-08-13 DIAGNOSIS — I69351 Hemiplegia and hemiparesis following cerebral infarction affecting right dominant side: Secondary | ICD-10-CM | POA: Diagnosis not present

## 2016-08-13 DIAGNOSIS — Z780 Asymptomatic menopausal state: Secondary | ICD-10-CM | POA: Diagnosis not present

## 2016-08-13 DIAGNOSIS — Z0000 Encounter for general adult medical examination without abnormal findings: Secondary | ICD-10-CM | POA: Diagnosis not present

## 2016-08-13 DIAGNOSIS — Z1159 Encounter for screening for other viral diseases: Secondary | ICD-10-CM | POA: Diagnosis not present

## 2016-08-13 DIAGNOSIS — E2839 Other primary ovarian failure: Secondary | ICD-10-CM

## 2016-08-15 ENCOUNTER — Other Ambulatory Visit: Payer: Self-pay | Admitting: Oncology

## 2016-08-15 DIAGNOSIS — C563 Malignant neoplasm of bilateral ovaries: Secondary | ICD-10-CM

## 2016-08-15 DIAGNOSIS — C562 Malignant neoplasm of left ovary: Principal | ICD-10-CM

## 2016-08-15 DIAGNOSIS — C561 Malignant neoplasm of right ovary: Secondary | ICD-10-CM

## 2016-08-17 DIAGNOSIS — Z1379 Encounter for other screening for genetic and chromosomal anomalies: Secondary | ICD-10-CM | POA: Insufficient documentation

## 2016-08-17 NOTE — Progress Notes (Signed)
GENETIC TEST RESULT  HPI: Brenda Cunningham was previously seen in the [facility] due to a personal history of ovarian and breast cancers and concerns regarding a hereditary predisposition to cancer. Please refer to our prior cancer genetics clinic note from July 07, 2016 for more information regarding Ms. Elpers's medical, social and family histories, and our assessment and recommendations, at the time. Ms. Strum recent genetic test results were disclosed to her, as were recommendations warranted by these results. These results and recommendations are discussed in more detail below.  GENETIC TEST RESULTS: At the time of Ms. Enck's visit on 07/07/16, we recommended she pursue genetic testing of the 20-gene Breast/Ovarian Cancer Panel with MSH2 Exons 1-7 Inversion Analysis through [facility].  The Breast/Ovarian Cancer Panel offered by [facility] [name], MD) includes sequencing and deletion/duplication analysis for the following 19 genes:  ATM, BARD1, BRCA1, BRCA2, BRIP1, CDH1, CHEK2, FANCC, MLH1, MSH2, MSH6, NBN, PALB2, PMS2, PTEN, RAD51C, RAD51D, TP53, and XRCC2.  This panel also includes deletion/duplication analysis (without sequencing) for one gene, EPCAM.  Those results are now back, the report date for which is July 21, 2016.  Genetic testing was normal, and did not reveal a deleterious mutation in these genes.  Additionally, no variants of uncertain significance (VUSes) were found.  The test report will be scanned into EPIC and will be located under the Results Review tab in the Pathology>Molecular Pathology section.   We discussed with Ms. Montone that since the current genetic testing is not perfect, it is possible there may be a gene mutation in one of these genes that current testing cannot detect, but that chance is small. We also discussed, that it is possible that another gene that has not yet been discovered, or that we have not yet tested,  is responsible for the cancer diagnoses in the family, and it is, therefore, important to remain in touch with cancer genetics in the future so that we can continue to offer Ms. Dowler the most up-to-date genetic testing.    CANCER SCREENING RECOMMENDATIONS: While we still do not have an explanation for Ms. Hubble's personal history of cancer, this result is reassuring and indicates that Ms. Slater likely does not have an increased risk for a future cancer due to a mutation in one of these genes. This normal test also suggests that Ms. Weissman's cancer was most likely not due to an inherited predisposition associated with one of these genes.  Most cancers happen by chance and this negative test suggests that her cancer falls into this category.  We, therefore, recommended she continue to follow the cancer management and screening guidelines provided by her oncology and primary healthcare providers.   RECOMMENDATIONS FOR FAMILY MEMBERS: Women in this family might be at some increased risk of developing breast and ovarian cancer, over the general population risk, simply due to the history of breast and ovarian cancer. We recommended women in this family have a yearly mammogram beginning at age 42, or 32 years younger than the earliest onset of cancer, an annual clinical breast exam, and perform monthly breast self-exams. Women in this family should also have a gynecological exam as recommended by their primary provider.  Ms. Chappuis's daughters and sisters should make their primary doctors aware of this history of breast and ovarian cancer, so that they may continue to receive the most appropriate screening and prevention options, even as guidelines change in the future.  All family members should have a colonoscopy by age  50.  FOLLOW-UP: Lastly, we discussed with Ms. Piehl that cancer genetics is a rapidly advancing field and it is possible that new genetic tests will be appropriate for her and/or her  family members in the future. We encouraged her to remain in contact with cancer genetics on an annual basis so we can update her personal and family histories and let her know of advances in cancer genetics that may benefit this family.   Our contact number was provided. Ms. Beavers questions were answered to her satisfaction, and she knows she is welcome to call us at anytime with additional questions or concerns.   Jeanine Luz, MS, [facility] Certified Genetic Counselor [email or web address] Phone: [phone]

## 2016-08-19 ENCOUNTER — Telehealth: Payer: Self-pay | Admitting: Oncology

## 2016-08-19 ENCOUNTER — Other Ambulatory Visit: Payer: Self-pay | Admitting: Oncology

## 2016-08-19 NOTE — Telephone Encounter (Signed)
Confirmed cxl appts for 9/14 per [facility] los

## 2016-08-20 ENCOUNTER — Other Ambulatory Visit: Payer: Medicare Other

## 2016-08-20 ENCOUNTER — Ambulatory Visit: Payer: Medicare Other | Admitting: Oncology

## 2016-08-21 ENCOUNTER — Ambulatory Visit
Admission: RE | Admit: 2016-08-21 | Discharge: 2016-08-21 | Disposition: A | Discharge status: Home / Self Care | Payer: Medicare Other | Source: Ambulatory Visit | Attending: Nurse Practitioner | Admitting: Nurse Practitioner

## 2016-08-21 ENCOUNTER — Other Ambulatory Visit: Payer: Self-pay

## 2016-08-21 DIAGNOSIS — E2839 Other primary ovarian failure: Secondary | ICD-10-CM

## 2016-08-21 DIAGNOSIS — Z780 Asymptomatic menopausal state: Secondary | ICD-10-CM | POA: Diagnosis not present

## 2016-08-21 DIAGNOSIS — M85851 Other specified disorders of bone density and structure, right thigh: Secondary | ICD-10-CM | POA: Diagnosis not present

## 2016-08-21 DIAGNOSIS — C562 Malignant neoplasm of left ovary: Principal | ICD-10-CM

## 2016-08-21 DIAGNOSIS — C561 Malignant neoplasm of right ovary: Secondary | ICD-10-CM

## 2016-08-21 DIAGNOSIS — C563 Malignant neoplasm of bilateral ovaries: Secondary | ICD-10-CM

## 2016-08-21 MED ORDER — LORAZEPAM 0.5 MG PO TABS: ORAL_TABLET | ORAL | 0 refills | Status: DC

## 2016-08-21 NOTE — Telephone Encounter (Signed)
S/w [name] about electronic refill request for lorazepam. Pt is using lorazepam more b/c PCP stopped Xanax when he saw [facility] prescribing lorazepam. Refill called in.

## 2016-08-25 DIAGNOSIS — Z23 Encounter for immunization: Secondary | ICD-10-CM | POA: Diagnosis not present

## 2016-09-07 ENCOUNTER — Ambulatory Visit: Payer: Medicare Other | Admitting: Rehabilitation

## 2016-09-07 ENCOUNTER — Encounter: Payer: Self-pay | Admitting: Occupational Therapy

## 2016-09-07 ENCOUNTER — Encounter: Payer: Self-pay | Admitting: Rehabilitation

## 2016-09-07 ENCOUNTER — Ambulatory Visit: Payer: Medicare Other | Attending: Family Medicine | Admitting: Occupational Therapy

## 2016-09-07 DIAGNOSIS — R2689 Other abnormalities of gait and mobility: Secondary | ICD-10-CM | POA: Diagnosis not present

## 2016-09-07 DIAGNOSIS — I69353 Hemiplegia and hemiparesis following cerebral infarction affecting right non-dominant side: Secondary | ICD-10-CM

## 2016-09-07 DIAGNOSIS — R2681 Unsteadiness on feet: Secondary | ICD-10-CM

## 2016-09-07 DIAGNOSIS — G8929 Other chronic pain: Secondary | ICD-10-CM | POA: Diagnosis not present

## 2016-09-07 DIAGNOSIS — M25511 Pain in right shoulder: Secondary | ICD-10-CM | POA: Diagnosis not present

## 2016-09-07 DIAGNOSIS — M25611 Stiffness of right shoulder, not elsewhere classified: Secondary | ICD-10-CM | POA: Diagnosis not present

## 2016-09-07 DIAGNOSIS — R4701 Aphasia: Secondary | ICD-10-CM | POA: Insufficient documentation

## 2016-09-07 DIAGNOSIS — M6281 Muscle weakness (generalized): Secondary | ICD-10-CM | POA: Diagnosis not present

## 2016-09-07 DIAGNOSIS — I69359 Hemiplegia and hemiparesis following cerebral infarction affecting unspecified side: Secondary | ICD-10-CM | POA: Diagnosis not present

## 2016-09-07 DIAGNOSIS — R41844 Frontal lobe and executive function deficit: Secondary | ICD-10-CM | POA: Diagnosis not present

## 2016-09-07 DIAGNOSIS — R41842 Visuospatial deficit: Secondary | ICD-10-CM | POA: Diagnosis not present

## 2016-09-07 NOTE — Therapy (Signed)
[facility] [address] [place], [place], [address] Phone: [phone]   Fax:  [phone]  Occupational Therapy Evaluation  Patient Details  Name: Brenda Cunningham MRN: [id number] Date of Birth: 11-Oct-1951 Referring Provider: Helane Rima  Encounter Date: 09/07/2016      OT End of Session - 09/07/16 1306    Visit Number 1   Number of Visits 16   Date for OT Re-Evaluation 11/02/16   Authorization Type medicare will need G code and PN every 10th visit   Authorization Time Period 60 days   Authorization - Visit Number 1   Authorization - Number of Visits 10   OT Start Time 1103   OT Stop Time 1150   OT Time Calculation (min) 47 min   Activity Tolerance Patient tolerated treatment well      Past Medical History:  Diagnosis Date  . A-fib ([place])   . Anxiety   . Arthritis   . Breast cancer ([place]) 2008   s/p lumpectomy, chemo and radiation therapy  . Complication of anesthesia    DIFFICULTY WAKING UP FROM ANESTHESIA  . Hyperlipidemia   . Hypertension   . Stroke ([place]) 07/29/2015  . Wears glasses     Past Surgical History:  Procedure Laterality Date  . APPENDECTOMY    . BREAST LUMPECTOMY  2008   left  . COLONOSCOPY  2004  . DEBULKING N/A 03/24/2016   Procedure: RADICAL TUMOR DEBULKING;  Surgeon: Everitt Amber, MD;  Location: [facility];  Service: Gynecology;  Laterality: N/A;  . OMENTECTOMY N/A 03/24/2016   Procedure: OMENTECTOMY;  Surgeon: Everitt Amber, MD;  Location: [facility];  Service: Gynecology;  Laterality: N/A;  . ROBOTIC ASSISTED TOTAL HYSTERECTOMY WITH BILATERAL SALPINGO OOPHERECTOMY Bilateral 03/24/2016   Procedure: XI ROBOTIC ASSISTED TOTA LAPAROSCOPIC  HYSTERECTOMY WITH BILATERAL SALPINGO OOPHORECTOMY;  Surgeon: Everitt Amber, MD;  Location: [facility];  Service: Gynecology;  Laterality: Bilateral;  . TUBAL LIGATION      There were no vitals filed for this visit.      Subjective Assessment - 09/07/16 1109     Subjective  I feel pretty good.    Patient is accompained by: Family member  son initially here and then ran errands.   Pertinent History see epic snapshot - PT WITH ACTIVE OVARIAN CANCER and cardiomegaly;  high risk for recurrence of stroke.    Patient Stated Goals Walk and my hand - I like being here.  I want to be able to walk in to my dt's house by November.   Currently in Pain? No/denies           [facility] OT Assessment - 09/07/16 0001      Assessment   Diagnosis L CVA   Referring Provider Helane Rima   Onset Date 07/29/15   Assessment Pt had stroke in 2016 and was in outpatient therapy. Pt was then diganosed with ovarian cancer and stopped rehab efforts.  Pt returns today with worsening functional mobility and decreased functional use of R hand.     Precautions   Precautions Fall;Other (comment)   Precaution Comments Pt with active ovarian cancer will need MD clearance for any heat based interventions.  Pt also recently diagnosed with cardiomegaley.       Restrictions   Weight Bearing Restrictions No     Balance Screen   Has the patient fallen in the past 6 months Yes  Pt has PT evaluation scheduled   How many times? 1  Home  Environment   Family/patient expects to be discharged to: Private residence   Living Arrangements Spouse/significant other   Available Help at Discharge Available 24 hours/day   Type of [place] One level   Bathroom Shower/Tub Haematologist   Additional Comments Pt has transfer tub bench, 3 in 1 seat and uses walker to access bathroom.       Prior Function   Level of Independence Independent  prior to stroke   Vocation On disability     ADL   Eating/Feeding Minimal assistance  for cutting   Grooming Modified independent   Upper Body Bathing Minimal assistance  for LUE and back   Lower Body Bathing Modified independent  increased time and effort   Upper Body Dressing Minimal assistance   for bra and needs help finding holes for shirt   Lower Body Dressing Minimal assistance  for socks and shoes and occassional to hike pants on R   Toilet Tranfer Modified independent  uses walker in bathroom to stand and turn   Toileting - Clothing Manipulation Modified independent   [place]   Tub/Shower Transfer Minimal assistance  pt feels she can do it along but dtr helps      IADL   Shopping Needs to be accompanied on any shopping trip   [employer] Housekeeping Does not participate in any housekeeping tasks   Meal Prep Needs to have meals prepared and served   [employer] on family or friends for transportation   Medication Management Is not capable of dispensing or managing own medication   Financial Management Requires assistance     Mobility   Mobility Status History of falls     Written Expression   Dominant Hand Left     Vision - History   Baseline Vision Wears glasses all the time   Additional Comments Pt denies visual changes     Vision Assessment   Eye Alignment Within Functional Limits     Activity Tolerance   Activity Tolerance Tolerate 30+ min activity without fatigue   Activity Tolerance Comments Pt reports she has trouble sleeping and can't take a nap. Pt takes sleeping pills at night and so is sleeping better at night. Pt fatigues however after short walk.       Cognition   Overall Cognitive Status History of cognitive impairments - at baseline     Sensation   Light Touch Impaired by gross assessment   Hot/Cold Appears Intact  pt reports bath water feels the same on both hands   Proprioception Impaired by gross assessment     Coordination   Gross Motor Movements are Fluid and Coordinated No   Fine Motor Movements are Fluid and Coordinated No     Perception   Perception Impaired   Inattention/Neglect --  Pt with history of R in attention;will con't to assess     Praxis   Praxis Impaired   Praxis  Impairment Details Motor planning     Tone   Assessment Location Right Upper Extremity     ROM / Strength   AROM / PROM / Strength AROM;PROM;Strength     AROM   Overall AROM  Deficits   Overall AROM Comments Shoulder flexion and abduction 75*, ER WFL's, supination pronation WFLs, 20* wrist flexion, 10* wrist flexion in gravity eliminated only;  no active finger extension and only flexor tone for grasp.  PROM   Overall PROM  Deficits   Overall PROM Comments 80* shoulder flexion, 75* abduction  - all other WFl's. Pt has pain in shoulder at end range of PROM 4/10.      Strength   Overall Strength Deficits   Overall Strength Comments 3-/5 in arm, 1/5 in wrist, 0/5 in hand     Hand Function   Right Hand Gross Grasp --  pt has no isolated active extension   Left Hand Gross Grasp Functional     RUE Tone   RUE Tone Other (Comment)  mixed tone/flaccid at wrist; iincreased in hand                           OT Short Term Goals - 09/07/16 1246      OT SHORT TERM GOAL #1   Title Pt and family will be mod I with HEP -      OT SHORT TERM GOAL #2   Title Pt will report no more than 3/10 pain in R shoulder with overhead reach   Status New     OT SHORT TERM GOAL #3   Title Pt will demonstrate 80* of shoulder flexion and abduction in RUE for overhead reach   Status New     OT SHORT TERM GOAL #4   Title Pt will require no more than supervision for static standing balance in prep for functional activity in standing   Status New     OT SHORT TERM GOAL #5   Title Pt will be mod I with hiking pants 100% of the time.   Status New           OT Long Term Goals - 09/07/16 1254      OT LONG TERM GOAL #1   Title Pt and family will be mod I with upgraded HEP - 11/03/2015   Status New     OT LONG TERM GOAL #2   Title Pt will require no more than min a for dynamic standing balance during functional activity   Status New     OT LONG TERM GOAL #3   Title Pt  will report no than 2/10 pain with overhead reach   Status New     OT LONG TERM GOAL #4   Title Pt will demonstrate 85* of shoulder flexion for overhead reach   Status New     OT LONG TERM GOAL #5   Title Pt will demonstrate 80* of abduction for overhead reach   Status New     Long Term Additional Goals   Additional Long Term Goals Yes     OT LONG TERM GOAL #6   Title Pt will be supevsion only for tub bench transfers   Status New     OT LONG TERM GOAL #7   Title Pt will use RUE as stabilzer with splint during basic ADL tasks prn   Status New     OT LONG TERM GOAL #8   Title Pt will tolerate at least 10 minutes of standing activity without requiring rest break.   Status New               Plan - 09/07/16 1258    Clinical Impression Statement Pt is a 65 year female s/p L CVA on 07/28/2016.  Pt was receiving outpatient therapies when she was diagnosed with ovarian cancer. Pt chose to stop therapy at that time to focus on cancer treatment and  now returns for follow up therapies.  PMH: AFIB, anxiety, major depression, arthritis, breast CA, HLD, HTN, cardiomegaley (severe on right), current active ovarian cancer. Pt is at very high ristk for recurrent stroke event.  Pt presents today with the following deficits that impact her ability to complete basic ADL, IADL, and leisure activity (pt is unable to work):  R non domnant hemiplegia, altered tone, decreased A/PROM of RUE, pain in R shoulder, decreased balance, decreased functional ambulation, decreased cognition, apraxia, R inattention, impaired sensation, aphasia, decreased activity tolerance.  Pt will benefit from skilled OT to addres these deficits to maximize independence in basic and simple ADL as well as leisure activities.    Rehab Potential Fair   Clinical Impairments Affecting Rehab Potential given severity of deficits, medical status   OT Frequency 2x / week   OT Duration 8 weeks   OT Treatment/Interventions Self-care/ADL  training;Electrical Stimulation;Moist Heat;DME and/or AE instruction;Neuromuscular education;Therapeutic exercise;Functional Mobility Training;Manual Therapy;Passive range of motion;Splinting;Therapeutic activities;Balance training;Patient/family education;Cognitive remediation/compensation;Visual/perceptual remediation/compensation  will need approval for any heat based intervention   Plan manual therapy to address pain/ROM of R shoulder, NMR for balance and functional mobility   Consulted and Agree with Plan of Care Patient      Patient will benefit from skilled therapeutic intervention in order to improve the following deficits and impairments:  Abnormal gait, Cardiopulmonary status limiting activity, Decreased activity tolerance, Decreased balance, Decreased cognition, Decreased mobility, Decreased range of motion, Decreased safety awareness, Difficulty walking, Decreased strength, Impaired UE functional use, Impaired tone, Impaired sensation, Impaired vision/preception, Pain  Visit Diagnosis: Hemiplegia and hemiparesis following cerebral infarction affecting right non-dominant side (HCC) - Plan: Ot plan of care cert/re-cert  Stiffness of right shoulder, not elsewhere classified - Plan: Ot plan of care cert/re-cert  Unsteadiness on feet - Plan: Ot plan of care cert/re-cert  Chronic right shoulder pain - Plan: Ot plan of care cert/re-cert  Visuospatial deficit - Plan: Ot plan of care cert/re-cert  Frontal lobe and executive function deficit - Plan: Ot plan of care cert/re-cert      G-Codes - [id number]    Functional Assessment Tool Used skilled clinical observation pt unable to complete standardized UE assessments   Functional Limitation Self care   Self Care Current Status [phone]) At least 60 percent but less than 80 percent impaired, limited or restricted   Self Care Goal Status [id number]) At least 20 percent but less than 40 percent impaired, limited or restricted      Problem  List Patient Active Problem List   Diagnosis Date Noted  . Genetic testing 08/17/2016  . Leukopenia due to antineoplastic chemotherapy ([place]) 06/05/2016  . Costochondritis, acute 05/02/2016  . Ovarian cancer ([place]) 03/24/2016  . Anemia due to antineoplastic chemotherapy 03/17/2016  . Port catheter in place 03/17/2016  . Portacath in place 02/05/2016  . Chemotherapy-induced peripheral neuropathy ([place]) 02/05/2016  . Hematoma 01/27/2016  . Chronic anticoagulation 01/22/2016  . Chronic atrial fibrillation ([place]) 01/22/2016  . PICC (peripherally inserted central catheter) in place 01/22/2016  . Chemotherapy induced neutropenia ([place]) 01/22/2016  . Poor venous access 01/22/2016  . CVA (cerebral vascular accident) ([place]) 12/27/2015  . Right hemiparesis ([place]) 12/27/2015  . Expressive aphasia 12/27/2015  . History of left breast cancer 12/27/2015  . H/O tubal ligation 12/27/2015  . Colon polyps 12/27/2015  . International Federation of Gynecology and Obstetrics (FIGO) stage IVB epithelial ovarian cancer ([place]) 12/27/2015  . Ovarian cancer, bilateral ([place]) 12/24/2015  . Inguinal adenopathy   . Carcinomatosis (  [place]) 12/06/2015  . Essential hypertension, benign 07/18/2014  . Atrial fibrillation, unspecified 07/18/2014  . Anxiety state, unspecified 07/18/2014    Quay Burow, OTR/L 09/07/2016, 1:13 PM  [facility] [address] [place], [place], [address] Phone: [phone]   Fax:  [phone]  Name: Brenda Cunningham MRN: [id number] Date of Birth: 1951/03/08

## 2016-09-07 NOTE — Therapy (Signed)
[facility] [address] [place], [place], [address] Phone: [phone]   Fax:  [phone]  Physical Therapy Evaluation  Patient Details  Name: Brenda Cunningham MRN: [id number] Date of Birth: 1951-07-27 Referring Provider: Helane Rima, MD  Encounter Date: 09/07/2016      PT End of Session - 09/07/16 1314    Visit Number 1   Number of Visits 17   Date for PT Re-Evaluation 11/06/66   Authorization Type Medicare Part A and B   Authorization Time Period G-Code every 10th visit   PT Start Time 1146   PT Stop Time 1235   PT Time Calculation (min) 49 min   Equipment Utilized During Treatment Gait belt   Activity Tolerance Patient tolerated treatment well   Behavior During Therapy WFL for tasks assessed/performed      Past Medical History:  Diagnosis Date  . A-fib ([place])   . Anxiety   . Arthritis   . Breast cancer ([place]) 2008   s/p lumpectomy, chemo and radiation therapy  . Complication of anesthesia    DIFFICULTY WAKING UP FROM ANESTHESIA  . Hyperlipidemia   . Hypertension   . Stroke ([place]) 07/29/2015  . Wears glasses     Past Surgical History:  Procedure Laterality Date  . APPENDECTOMY    . BREAST LUMPECTOMY  2008   left  . COLONOSCOPY  2004  . DEBULKING N/A 03/24/2016   Procedure: RADICAL TUMOR DEBULKING;  Surgeon: Everitt Amber, MD;  Location: [facility];  Service: Gynecology;  Laterality: N/A;  . OMENTECTOMY N/A 03/24/2016   Procedure: OMENTECTOMY;  Surgeon: Everitt Amber, MD;  Location: [facility];  Service: Gynecology;  Laterality: N/A;  . ROBOTIC ASSISTED TOTAL HYSTERECTOMY WITH BILATERAL SALPINGO OOPHERECTOMY Bilateral 03/24/2016   Procedure: XI ROBOTIC ASSISTED TOTA LAPAROSCOPIC  HYSTERECTOMY WITH BILATERAL SALPINGO OOPHORECTOMY;  Surgeon: Everitt Amber, MD;  Location: [facility];  Service: Gynecology;  Laterality: Bilateral;  . TUBAL LIGATION      There were no vitals filed for this visit.       Subjective  Assessment - 09/07/16 1155    Subjective Pt reports difficulty walking up stairs, walking in her home, and R LE weakness with decr endurance.     Limitations Standing;Walking;House hold activities   Patient Stated Goals "I want to get up and see my daughter and get on the train to see my sons in [place]."   Currently in Pain? No/denies   Multiple Pain Sites No            [facility] PT Assessment - 09/07/16 1145      Assessment   Medical Diagnosis CVA   Referring Provider Helane Rima, MD   Onset Date/Surgical Date 07/29/15   Hand Dominance Left     Precautions   Precautions Fall;Other (comment)   Precaution Comments Active ovarian CA     Balance Screen   Has the patient fallen in the past 6 months Yes   How many times? 2   Has the patient had a decrease in activity level because of a fear of falling?  Yes   Is the patient reluctant to leave their home because of a fear of falling?  Yes     [place] residence   Living Arrangements Spouse/significant other;Other (Comment)  2 dogs   Available Help at Discharge Family;Friend(s)   Type of [place] to enter   Entrance Stairs-Number of Steps 5  Entrance Stairs-Rails Left  Going up   Home Layout One level   Home Equipment Wheelchair - manual;Walker - 2 wheels;Shower seat;Toilet riser     Prior Function   Level of Independence Independent with basic ADLs;Independent with community mobility without device   Vocation On disability     Coordination   Gross Motor Movements are Fluid and Coordinated Yes   Heel Shin Test B deficits mostly due to decr strength     Tone   Assessment Location Right Lower Extremity;Left Lower Extremity     ROM / Strength   AROM / PROM / Strength AROM;Strength     AROM   Overall AROM  Within functional limits for tasks performed   AROM Assessment Site Hip;Knee;Ankle     Strength   Overall Strength Deficits   Strength Assessment  Site Hip;Knee;Ankle   Right/Left Hip Right;Left   Right Hip Flexion 3-/5   Right Hip ABduction 3-/5   Left Hip Flexion 3/5   Left Hip ABduction 3/5   Right/Left Knee Right;Left   Right Knee Flexion 3-/5   Right Knee Extension 3/5   Left Knee Flexion 3/5   Left Knee Extension 3+/5   Right/Left Ankle Right;Left   Right Ankle Dorsiflexion 3-/5   Left Ankle Dorsiflexion 3+/5     Transfers   Transfers Sit to Stand;Stand to Sit;Stand Pivot Transfers   Sit to Stand With upper extremity assist;With armrests;From chair/3-in-1;4: Min guard   Stand to Sit 4: Min guard;With upper extremity assist;With armrests;To chair/3-in-1   Stand Pivot Transfers 4: Min assist;With armrests     Ambulation/Gait   Ambulation/Gait Yes   Ambulation/Gait Assistance 4: Min guard   Ambulation Distance (Feet) 30 Feet  1x76ft, 1x6ft   Assistive device Rolling walker   Gait Pattern Step-through pattern;Decreased step length - left;Decreased stance time - right;Decreased stride length;Decreased hip/knee flexion - right;Trunk flexed;Narrow base of support;Poor foot clearance - right   Ambulation Surface Level;Indoor     Standardized Balance Assessment   Standardized Balance Assessment Berg Balance Test;Timed Up and Go Test     Berg Balance Test   Sit to Stand Able to stand using hands after several tries   Standing Unsupported Able to stand 2 minutes with supervision   Sitting with Back Unsupported but Feet Supported on Floor or Stool Able to sit safely and securely 2 minutes   Stand to Sit Uses backs of legs against chair to control descent   Transfers Needs one person to assist   Standing Unsupported with Eyes Closed Able to stand 3 seconds   Standing Ubsupported with Feet Together Needs help to attain position but able to stand for 30 seconds with feet together   From Standing, Reach Forward with Outstretched Arm Loses balance while trying/requires external support   From Standing Position, Pick up Object  from Floor Able to pick up shoe, needs supervision   From Standing Position, Turn to Look Behind Over each Shoulder Needs supervision when turning   Turn 360 Degrees Needs close supervision or verbal cueing   Standing Unsupported, Alternately Place Feet on Step/Stool Able to complete >2 steps/needs minimal assist   Standing Unsupported, One Foot in Front Needs help to step but can hold 15 seconds   Standing on One Leg Tries to lift leg/unable to hold 3 seconds but remains standing independently   Total Score 23   Berg comment: Inicates high fall risk     Timed Up and Go Test   TUG Normal TUG  Normal TUG (seconds) 35.5  With RW and minA; indicates high fall risk     RLE Tone   RLE Tone Within Functional Limits     LLE Tone   LLE Tone Within Functional Limits                           PT Education - 09/07/16 1310    Education provided Yes   Education Details Pt educated on eval findings, POC, and to use RW when at home and in the community for incr safety when ambulating.   Person(s) Educated Patient   Methods Explanation   Comprehension Verbalized understanding          PT Short Term Goals - 09/07/16 1347      PT SHORT TERM GOAL #1   Title Pt will be independent with assistance from caregiver and verbalize understand of initial HEP to further progress toward her goals. (Target Date for all STGs: 10/05/16)   Time 4   Period Weeks   Status New     PT SHORT TERM GOAL #2   Title Pt will improve Berg Balance score to > or = 26/56 to indicate an improvement in static balance.   Time 4   Period Weeks   Status New     PT SHORT TERM GOAL #3   Title Pt will improve TUG time to < or = 32.5 sec indicating an improvemet in functional mobility and balance.   Time 4   Period Weeks   Status New     PT SHORT TERM GOAL #4   Title Pt will improve initial gait speed by 0.6 ft/sec to indicate improvement in functional mobility and functional status.   Time 4    Period Weeks   Status New     PT SHORT TERM GOAL #5   Title Pt will ambulate 234ft with LRAD and supervision indoors over level surfaces including carpet and hard flors to incr ability to safely ambulate at home.   Time 4   Period Weeks   Status New     Additional Short Term Goals   Additional Short Term Goals Yes     PT SHORT TERM GOAL #6   Title Pt will negotiate 4 steps with B UE support and supervision to improve pt's ability to safely enter/exit her house.   Time 4   Period Weeks   Status New           PT Long Term Goals - 09/07/16 1354      PT LONG TERM GOAL #1   Title Pt will be independent and verbalize understanding of HEP an on-going fitness plan to maintain progress made in therapy and improve overall health. (Target Date for all LTGs: 11/02/16)   Time 8   Period Weeks   Status New     PT LONG TERM GOAL #2   Title Pt will improve Berg Balance score to > or = 29/56 to indicate an improvement in static balance and incr safety when performing ADLs.   Time 8   Period Weeks   Status New     PT LONG TERM GOAL #3   Title Pt will improve TUG time to < or = 29.5 sec to indicate an incr in functional mobility and balance when performing ADLs.   Time 8   Period Weeks   Status New     PT LONG TERM GOAL #4   Title Pt will improve  gait speed from baseline by 1.2 ft/sec to indicate an incr in functional mobility and safety.   Time 8   Period Weeks   Status New     PT LONG TERM GOAL #5   Title Pt will ambulate 535ft with LRAD and supervision outdoors over level and unlevel paved surfaces, ramps, and curbs to incr functional mobility and safety while ambulating in the community.   Time 8   Period Weeks   Status New     Additional Long Term Goals   Additional Long Term Goals Yes     PT LONG TERM GOAL #6   Title Pt will negotiate 8 steps with supervision and unilat UE support to incr safety while entering/exiting family/friend's home.   Time 8   Period Weeks    Status New               Plan - 2016-10-05 1321    Clinical Impression Statement Pt is a 65 year old female who presents to outpatient PT with a diagnosis of CVA (07/29/15) with residual weakness in the R UE and LE as well as expressive aphasia.  PMH is significant for active ovarian CA, HTN, breast CA, OA, and atrial fibrillation.  Pt is currently not on chemotherapy treatment for ovarian CA, however, has the potential to continue treatemnt at a later date.  Since the onset of ovarian CA and subsequent chemotherapy treatments, pt has seen an overall decr in activity level resulting in weakness in B LE, particularly the R LE and decr in activity tolerance/endurance.  Pt has also seen a decr in R (non-dominant) hand/UE function since onset of CA.  She demonstrates decr static balance as evidence by a Berg Balance score of 23/56 indicating she is a high risk for falls.  Funcitonal mobility is also impaired as noted with a TUG time of 35.50 seconds indicating she is a high fall risk.  Overall, gait is impaired due to lack of strength in B LE and decr endurance with noted deficits while ambulating with RW.  Pt reports and demonstrates fear of losing balance/falling when ambulating and performing functional mobility while holding breath throughout activites.  Pt's condition appears to be evolving and of moderate complexity.  She would benefit from skilled PT to address her deficits.   Rehab Potential Fair   Clinical Impairments Affecting Rehab Potential Chronic stroke status, active ovarian CA, HTN, expressive aphasia,    PT Frequency 2x / week   PT Duration 8 weeks   PT Treatment/Interventions ADLs/Self Care Home Management;Functional mobility training;Stair training;Gait training;DME Instruction;Therapeutic activities;Therapeutic exercise;Balance training;Neuromuscular re-education;Patient/family education;Orthotic Fit/Training;Manual techniques;Energy conservation   PT Next Visit Plan Assess gait  speed, initiate HEP, adjust brakes on w/c (not working), strengthening and balance activities that challenge activity tolerance   Consulted and Agree with Plan of Care Patient      Patient will benefit from skilled therapeutic intervention in order to improve the following deficits and impairments:  Abnormal gait, Cardiopulmonary status limiting activity, Decreased activity tolerance, Decreased balance, Decreased mobility, Decreased knowledge of use of DME, Decreased knowledge of precautions, Decreased endurance, Decreased safety awareness, Decreased strength, Impaired perceived functional ability, Impaired flexibility, Improper body mechanics, Impaired UE functional use  Visit Diagnosis: Hemiparesis affecting nondominant side as late effect of cerebrovascular accident (CVA) ([place])  Other abnormalities of gait and mobility  Unsteadiness on feet  Muscle weakness (generalized)      G-Codes - 05-Oct-2016 1644    Functional Assessment Tool Used BERG: 23/56, TUG: 35.50  secs   Functional Limitation Mobility: Walking and moving around   Mobility: Walking and Moving Around Current Status [phone]) At least 40 percent but less than 60 percent impaired, limited or restricted   Mobility: Walking and Moving Around Goal Status [phone]) At least 40 percent but less than 60 percent impaired, limited or restricted       Problem List Patient Active Problem List   Diagnosis Date Noted  . Genetic testing 08/17/2016  . Leukopenia due to antineoplastic chemotherapy ([place]) 06/05/2016  . Costochondritis, acute 05/02/2016  . Ovarian cancer ([place]) 03/24/2016  . Anemia due to antineoplastic chemotherapy 03/17/2016  . Port catheter in place 03/17/2016  . Portacath in place 02/05/2016  . Chemotherapy-induced peripheral neuropathy ([place]) 02/05/2016  . Hematoma 01/27/2016  . Chronic anticoagulation 01/22/2016  . Chronic atrial fibrillation ([place]) 01/22/2016  . PICC (peripherally inserted central catheter) in place  01/22/2016  . Chemotherapy induced neutropenia ([place]) 01/22/2016  . Poor venous access 01/22/2016  . CVA (cerebral vascular accident) ([place]) 12/27/2015  . Right hemiparesis ([place]) 12/27/2015  . Expressive aphasia 12/27/2015  . History of left breast cancer 12/27/2015  . H/O tubal ligation 12/27/2015  . Colon polyps 12/27/2015  . International Federation of Gynecology and Obstetrics (FIGO) stage IVB epithelial ovarian cancer ([place]) 12/27/2015  . Ovarian cancer, bilateral ([place]) 12/24/2015  . Inguinal adenopathy   . Carcinomatosis ([place]) 12/06/2015  . Essential hypertension, benign 07/18/2014  . Atrial fibrillation, unspecified 07/18/2014  . Anxiety state, unspecified 07/18/2014    Katherine Mantle, SPT 09/07/2016, 4:53 PM  [facility] [address] [place], [place], [address] Phone: [phone]   Fax:  [phone]  Name: Avett Buonocore MRN: [id number] Date of Birth: Dec 23, 1950

## 2016-09-14 ENCOUNTER — Ambulatory Visit: Payer: Medicare Other | Admitting: Occupational Therapy

## 2016-09-14 ENCOUNTER — Encounter: Payer: Self-pay | Admitting: Occupational Therapy

## 2016-09-14 DIAGNOSIS — G8929 Other chronic pain: Secondary | ICD-10-CM | POA: Diagnosis not present

## 2016-09-14 DIAGNOSIS — R2681 Unsteadiness on feet: Secondary | ICD-10-CM

## 2016-09-14 DIAGNOSIS — I69353 Hemiplegia and hemiparesis following cerebral infarction affecting right non-dominant side: Secondary | ICD-10-CM

## 2016-09-14 DIAGNOSIS — R41842 Visuospatial deficit: Secondary | ICD-10-CM | POA: Diagnosis not present

## 2016-09-14 DIAGNOSIS — M25611 Stiffness of right shoulder, not elsewhere classified: Secondary | ICD-10-CM | POA: Diagnosis not present

## 2016-09-14 DIAGNOSIS — M25511 Pain in right shoulder: Secondary | ICD-10-CM | POA: Diagnosis not present

## 2016-09-14 DIAGNOSIS — R41844 Frontal lobe and executive function deficit: Secondary | ICD-10-CM

## 2016-09-14 NOTE — Therapy (Signed)
[facility] [address] [place], [place], [address] Phone: [phone]   Fax:  [phone]  Occupational Therapy Treatment  Patient Details  Name: Brenda Cunningham MRN: [id number] Date of Birth: 09-02-1951 Referring Provider: Helane Rima  Encounter Date: 09/14/2016      OT End of Session - 09/14/16 1637    Visit Number 2   Number of Visits 16   Date for OT Re-Evaluation 11/02/16   Authorization Type medicare will need G code and PN every 10th visit   Authorization Time Period 2   Authorization - Number of Visits 10   OT Start Time 1447   OT Stop Time 1529   OT Time Calculation (min) 42 min   Activity Tolerance Patient tolerated treatment well      Past Medical History:  Diagnosis Date  . A-fib ([place])   . Anxiety   . Arthritis   . Breast cancer ([place]) 2008   s/p lumpectomy, chemo and radiation therapy  . Complication of anesthesia    DIFFICULTY WAKING UP FROM ANESTHESIA  . Hyperlipidemia   . Hypertension   . Stroke ([place]) 07/29/2015  . Wears glasses     Past Surgical History:  Procedure Laterality Date  . APPENDECTOMY    . BREAST LUMPECTOMY  2008   left  . COLONOSCOPY  2004  . DEBULKING N/A 03/24/2016   Procedure: RADICAL TUMOR DEBULKING;  Surgeon: Everitt Amber, MD;  [facility];  Service: Gynecology;  Laterality: N/A;  . OMENTECTOMY N/A 03/24/2016   Procedure: OMENTECTOMY;  Surgeon: Everitt Amber, MD;  [facility];  Service: Gynecology;  Laterality: N/A;  . ROBOTIC ASSISTED TOTAL HYSTERECTOMY WITH BILATERAL SALPINGO OOPHERECTOMY Bilateral 03/24/2016   Procedure: XI ROBOTIC ASSISTED TOTA LAPAROSCOPIC  HYSTERECTOMY WITH BILATERAL SALPINGO OOPHORECTOMY;  Surgeon: Everitt Amber, MD;  [facility];  Service: Gynecology;  Laterality: Bilateral;  . TUBAL LIGATION      There were no vitals filed for this visit.      Subjective Assessment - 09/14/16 1628    Pertinent History see epic snapshot - PT  WITH ACTIVE OVARIAN CANCER and cardiomegaly;  high risk for recurrence of stroke.    Patient Stated Goals Walk and my hand - I like being here.  I want to be able to walk in to my dt's house by November.   Currently in Pain? Yes   Pain Score --  difficulty rating   Pain Location Shoulder   Pain Orientation Right   Pain Descriptors / Indicators Aching   Pain Type Chronic pain   Pain Onset More than a month ago   Pain Frequency Intermittent   Aggravating Factors  raise arm overhead   Pain Relieving Factors avoid movement   Multiple Pain Sites No                      OT Treatments/Exercises (OP) - 09/14/16 0001      ADLs   Functional Mobility Addressed functional mobility with RW to assess if pt has enough control of RUE to use - pt able to manage RW with hand orthosis and supervision, cues to step normally with L foot.  Pt has to look at foot and hand due to severe sensory loss and L neglect.  Pt became very fatigued with only approximately 2-3 minutes of ambulatory activity.  Pt demonstrates improved aligment, balance and safety with RW for functional mobility.  WIll discuss with PT.    ADL Comments Reviewed  all goals with pt and pt in agreement     Neurological Re-education Exercises   Other Exercises 1 Neuro re ed to address bilateral overhead reach in closed chain activity using overhead reach in supine, place and hold. Pt needs assist to hold objects however improved painfree ROM in supine.                    OT Short Term Goals - 09/14/16 1634      OT SHORT TERM GOAL #1   Title Pt and family will be mod I with HEP -    Status On-going     OT SHORT TERM GOAL #2   Title Pt will report no more than 3/10 pain in R shoulder with overhead reach   Status On-going     OT SHORT TERM GOAL #3   Title Pt will demonstrate 80* of shoulder flexion and abduction in RUE for overhead reach   Status On-going     OT SHORT TERM GOAL #4   Title Pt will require no more  than supervision for static standing balance in prep for functional activity in standing   Status On-going     OT SHORT TERM GOAL #5   Title Pt will be mod I with hiking pants 100% of the time.   Status On-going           OT Long Term Goals - 09/14/16 1635      OT LONG TERM GOAL #1   Title Pt and family will be mod I with upgraded HEP - 11/03/2015   Status On-going     OT LONG TERM GOAL #2   Title Pt will require no more than min a for dynamic standing balance during functional activity   Status On-going     OT LONG TERM GOAL #3   Title Pt will report no than 2/10 pain with overhead reach   Status On-going     OT LONG TERM GOAL #4   Title Pt will demonstrate 85* of shoulder flexion for overhead reach   Status On-going     OT LONG TERM GOAL #5   Title Pt will demonstrate 80* of abduction for overhead reach   Status On-going     OT LONG TERM GOAL #6   Title Pt will be supevsion only for tub bench transfers   Status On-going     OT LONG TERM GOAL #7   Title Pt will use RUE as stabilzer with splint during basic ADL tasks prn   Status On-going     OT LONG TERM GOAL #8   Title Pt will tolerate at least 10 minutes of standing activity without requiring rest break.   Status On-going               Plan - 09/14/16 1635    Clinical Impression Statement Pt progressing toward goals.  Pt requires max cueing and repetition to learn new activities. WIll discuss HEP with pt and family member next visit.    Rehab Potential Fair   Clinical Impairments Affecting Rehab Potential given severity of deficits, medical status   OT Frequency 2x / week   OT Duration 8 weeks   OT Treatment/Interventions Self-care/ADL training;Electrical Stimulation;Moist Heat;DME and/or AE instruction;Neuromuscular education;Therapeutic exercise;Functional Mobility Training;Manual Therapy;Passive range of motion;Splinting;Therapeutic activities;Balance training;Patient/family education;Cognitive  remediation/compensation;Visual/perceptual remediation/compensation   Plan address pain in R shoulder, ROM/NMR for RUE, NMR for balance and functional mobility, activity tolerance   Consulted and Agree with Plan of  Care Patient      Patient will benefit from skilled therapeutic intervention in order to improve the following deficits and impairments:  Abnormal gait, Cardiopulmonary status limiting activity, Decreased activity tolerance, Decreased balance, Decreased cognition, Decreased mobility, Decreased range of motion, Decreased safety awareness, Difficulty walking, Decreased strength, Impaired UE functional use, Impaired tone, Impaired sensation, Impaired vision/preception, Pain  Visit Diagnosis: Hemiplegia and hemiparesis following cerebral infarction affecting right non-dominant side (HCC)  Stiffness of right shoulder, not elsewhere classified  Chronic right shoulder pain  Visuospatial deficit  Frontal lobe and executive function deficit  Unsteadiness on feet    Problem List Patient Active Problem List   Diagnosis Date Noted  . Genetic testing 08/17/2016  . Leukopenia due to antineoplastic chemotherapy ([place]) 06/05/2016  . Costochondritis, acute 05/02/2016  . Ovarian cancer ([facility] [facility]) 03/24/2016  . Anemia due to antineoplastic chemotherapy 03/17/2016  . Port catheter in place 03/17/2016  . Portacath in place 02/05/2016  . Chemotherapy-induced peripheral neuropathy ([place]) 02/05/2016  . Hematoma 01/27/2016  . Chronic anticoagulation 01/22/2016  . Chronic atrial fibrillation ([place]) 01/22/2016  . PICC (peripherally inserted central catheter) in place 01/22/2016  . Chemotherapy induced neutropenia ([place]) 01/22/2016  . Poor venous access 01/22/2016  . CVA (cerebral vascular accident) ([place]) 12/27/2015  . Right hemiparesis ([place]) 12/27/2015  . Expressive aphasia 12/27/2015  . History of left breast cancer 12/27/2015  . H/O tubal ligation 12/27/2015  . Colon polyps 12/27/2015  .  International Federation of Gynecology and Obstetrics (FIGO) stage IVB epithelial ovarian cancer ([place]) 12/27/2015  . Ovarian cancer, bilateral ([place]) 12/24/2015  . Inguinal adenopathy   . Carcinomatosis ([place]) 12/06/2015  . Essential hypertension, benign 07/18/2014  . Atrial fibrillation, unspecified 07/18/2014  . Anxiety state, unspecified 07/18/2014    Quay Burow, OTR/L 09/14/2016, 4:39 PM  [facility] [address] [place], [place], [address] Phone: [phone]   Fax:  [phone]  Name: Mallisa Hammell MRN: [id number] Date of Birth: 06-18-1951

## 2016-09-15 ENCOUNTER — Ambulatory Visit: Payer: Medicare Other | Admitting: Physical Therapy

## 2016-09-16 ENCOUNTER — Encounter: Payer: Self-pay | Admitting: Physical Therapy

## 2016-09-16 ENCOUNTER — Ambulatory Visit: Payer: Medicare Other | Admitting: Physical Therapy

## 2016-09-16 DIAGNOSIS — R2681 Unsteadiness on feet: Secondary | ICD-10-CM

## 2016-09-16 DIAGNOSIS — R2689 Other abnormalities of gait and mobility: Secondary | ICD-10-CM

## 2016-09-16 DIAGNOSIS — G8929 Other chronic pain: Secondary | ICD-10-CM | POA: Diagnosis not present

## 2016-09-16 DIAGNOSIS — I69359 Hemiplegia and hemiparesis following cerebral infarction affecting unspecified side: Secondary | ICD-10-CM

## 2016-09-16 DIAGNOSIS — I69353 Hemiplegia and hemiparesis following cerebral infarction affecting right non-dominant side: Secondary | ICD-10-CM | POA: Diagnosis not present

## 2016-09-16 DIAGNOSIS — M25511 Pain in right shoulder: Secondary | ICD-10-CM | POA: Diagnosis not present

## 2016-09-16 DIAGNOSIS — M25611 Stiffness of right shoulder, not elsewhere classified: Secondary | ICD-10-CM | POA: Diagnosis not present

## 2016-09-16 DIAGNOSIS — M6281 Muscle weakness (generalized): Secondary | ICD-10-CM

## 2016-09-16 DIAGNOSIS — R41842 Visuospatial deficit: Secondary | ICD-10-CM | POA: Diagnosis not present

## 2016-09-16 NOTE — Patient Instructions (Addendum)
Bridge    Lie back, legs bent and arms at sides as shown. Lift hips up off mat/bed. Slowly lower back down.  Perform 10 reps.  __1-2__ sessions per day.  http://pm.exer.us/55   Copyright  VHI. All rights reserved.    Strengthening: Hip Abductor - Resisted    LYING flat: With red band looped around both legs above knees, push thighs apart. Hold this way for 5 seconds., then slowly bring legs back together. Repeat __10_ times per set. Do __1__ sets per session. Do __1-2__ sessions per day.  http://orth.exer.us/688   Copyright  VHI. All rights reserved.   Strengthening: Straight Leg Raise (Phase 1)    Tighten muscles on front of right thigh, then lift leg _2-3___ inches from surface, keeping knee locked. Slowly lower leg back down to bed/mat.   Repeat _10___ times each leg. Do 1 sets per session. Do _1-2__ sessions per day.  http://orth.exer.us/614     Copyright  VHI. All rights reserved.   Functional Quadriceps: Sit to Stand    Sit on edge of a high surface :chair/bed, feet flat on floor. Using your arms, stand up tall, extending knees fully. Have a chair or walker in front of you to steady yourself on once standing. Repeat __5__ times per set. Do __1-2_ sets per session. Do __1-2_ sessions per day.  http://orth.exer.us/735   Copyright  VHI. All rights reserved.

## 2016-09-17 ENCOUNTER — Ambulatory Visit: Payer: Medicare Other

## 2016-09-17 ENCOUNTER — Other Ambulatory Visit (HOSPITAL_BASED_OUTPATIENT_CLINIC_OR_DEPARTMENT_OTHER): Payer: Medicare Other

## 2016-09-17 VITALS — BP 128/70 | HR 82 | Temp 97.7°F | Resp 18

## 2016-09-17 DIAGNOSIS — Z95828 Presence of other vascular implants and grafts: Secondary | ICD-10-CM

## 2016-09-17 DIAGNOSIS — C562 Malignant neoplasm of left ovary: Secondary | ICD-10-CM | POA: Diagnosis present

## 2016-09-17 DIAGNOSIS — C563 Malignant neoplasm of bilateral ovaries: Secondary | ICD-10-CM

## 2016-09-17 DIAGNOSIS — C561 Malignant neoplasm of right ovary: Secondary | ICD-10-CM

## 2016-09-17 LAB — COMPREHENSIVE METABOLIC PANEL
ALT: 22 U/L (ref 0–55)
AST: 19 U/L (ref 5–34)
Albumin: 3.5 g/dL (ref 3.5–5.0)
Alkaline Phosphatase: 102 U/L (ref 40–150)
Anion Gap: 9 mEq/L (ref 3–11)
BUN: 12.7 mg/dL (ref 7.0–26.0)
CO2: 25 mEq/L (ref 22–29)
Calcium: 9.4 mg/dL (ref 8.4–10.4)
Chloride: 108 mEq/L (ref 98–109)
Creatinine: 0.7 mg/dL (ref 0.6–1.1)
EGFR: >90 mL/min/{1.73_m2} (ref >90)
Glucose: 90 mg/dl (ref 70–140)
Potassium: 3.9 mEq/L (ref 3.5–5.1)
Sodium: 142 mEq/L (ref 136–145)
Total Bilirubin: 0.56 mg/dL (ref 0.20–1.20)
Total Protein: 6.9 g/dL (ref 6.4–8.3)

## 2016-09-17 LAB — CBC WITH DIFFERENTIAL/PLATELET
BASO%: 0.2 % (ref 0.0–2.0)
Basophils Absolute: 0 10*3/uL (ref 0.0–0.1)
EOS%: 2.5 % (ref 0.0–7.0)
Eosinophils Absolute: 0.1 10*3/uL (ref 0.0–0.5)
HCT: 37.6 % (ref 34.8–46.6)
HGB: 12.6 g/dL (ref 11.6–15.9)
LYMPH%: 50.2 % — ABNORMAL HIGH (ref 14.0–49.7)
MCH: 33.7 pg (ref 25.1–34.0)
MCHC: 33.5 g/dL (ref 31.5–36.0)
MCV: 100.5 fL (ref 79.5–101.0)
MONO#: 0.4 10*3/uL (ref 0.1–0.9)
MONO%: 10.9 % (ref 0.0–14.0)
NEUT#: 1.5 10*3/uL (ref 1.5–6.5)
NEUT%: 36.2 % — ABNORMAL LOW (ref 38.4–76.8)
Platelets: 181 10*3/uL (ref 145–400)
RBC: 3.74 10*6/uL (ref 3.70–5.45)
RDW: 14.6 % — ABNORMAL HIGH (ref 11.2–14.5)
WBC: 4 10*3/uL (ref 3.9–10.3)
lymph#: 2 10*3/uL (ref 0.9–3.3)

## 2016-09-17 MED ORDER — SODIUM CHLORIDE 0.9 % IJ SOLN
10.0000 mL | INTRAMUSCULAR | Status: DC | PRN
  Administered 2016-09-17: 10 mL via INTRAVENOUS
  Filled 2016-09-17: qty 10

## 2016-09-17 MED ORDER — HEPARIN SOD (PORK) LOCK FLUSH 100 UNIT/ML IV SOLN
500.0000 [IU] | Freq: Once | INTRAVENOUS | Status: AC | PRN
  Administered 2016-09-17: 500 [IU] via INTRAVENOUS
  Filled 2016-09-17: qty 5

## 2016-09-17 NOTE — Therapy (Signed)
[facility] [address] [place], [place], [address] Phone: [phone]   Fax:  [phone]  Physical Therapy Treatment  Patient Details  Name: Brenda Cunningham MRN: [id number] Date of Birth: [birth year] Referring Provider: Helane Rima, MD  Encounter Date: 09/16/2016      PT End of Session - 09/16/16 1238    Visit Number 2   Number of Visits 17   Date for PT Re-Evaluation 11/06/16   Authorization Type Medicare Part A and B   Authorization Time Period G-Code every 10th visit   PT Start Time 1233   PT Stop Time 1313   PT Time Calculation (min) 40 min   Equipment Utilized During Treatment Gait belt   Activity Tolerance Patient tolerated treatment well   Behavior During Therapy WFL for tasks assessed/performed      Past Medical History:  Diagnosis Date  . A-fib ([place])   . Anxiety   . Arthritis   . Breast cancer ([place]) 2008   s/p lumpectomy, chemo and radiation therapy  . Complication of anesthesia    DIFFICULTY WAKING UP FROM ANESTHESIA  . Hyperlipidemia   . Hypertension   . Stroke ([place]) 07/29/2015  . Wears glasses     Past Surgical History:  Procedure Laterality Date  . APPENDECTOMY    . BREAST LUMPECTOMY  2008   left  . COLONOSCOPY  2004  . DEBULKING N/A 03/24/2016   Procedure: RADICAL TUMOR DEBULKING;  Surgeon: Everitt Amber, MD;  Location: [facility];  Service: Gynecology;  Laterality: N/A;  . OMENTECTOMY N/A 03/24/2016   Procedure: OMENTECTOMY;  Surgeon: Everitt Amber, MD;  Location: [facility];  Service: Gynecology;  Laterality: N/A;  . ROBOTIC ASSISTED TOTAL HYSTERECTOMY WITH BILATERAL SALPINGO OOPHERECTOMY Bilateral 03/24/2016   Procedure: XI ROBOTIC ASSISTED TOTA LAPAROSCOPIC  HYSTERECTOMY WITH BILATERAL SALPINGO OOPHORECTOMY;  Surgeon: Everitt Amber, MD;  Location: [facility];  Service: Gynecology;  Laterality: Bilateral;  . TUBAL LIGATION      There were no vitals filed for this visit.      Subjective Assessment  - 09/16/16 1236    Subjective No new compliants. No falls to report. Reporting some abdominal pain, pt feels its gas.   Limitations Standing;Walking;House hold activities   Patient Stated Goals "I want to get up and see my daughter and get on the train to see my sons in [place]."   Currently in Pain? Yes   Pain Score 3    Pain Location Abdomen   Pain Descriptors / Indicators Sore   Pain Type Acute pain   Pain Onset Yesterday  last night   Pain Frequency Other (Comment)   Aggravating Factors  pt feels it's gas related   Pain Relieving Factors pt took some gas relief medication             [facility] Adult PT Treatment/Exercise - 09/16/16 1242      Transfers   Transfers Sit to Stand;Stand to Sit   Sit to Stand 4: Min guard;With upper extremity assist;From bed;With armrests;From chair/3-in-1   Stand to Sit 4: Min guard;With upper extremity assist;With armrests;To chair/3-in-1;To bed     Ambulation/Gait   Ambulation/Gait Yes   Ambulation/Gait Assistance 4: Min guard   Ambulation Distance (Feet) 135 Feet   Assistive device Rolling walker  with right hand orthotic   Gait Pattern Step-through pattern;Decreased step length - left;Decreased stance time - right;Decreased stride length;Decreased hip/knee flexion - right;Trunk flexed;Narrow base of support;Poor foot clearance - right   Ambulation  Surface Level;Indoor   Gait velocity 16.19 sec's with RW= 2.03 ft/sec, min guard assist.     Issued the following to pt's HEP:  Bridge    Lie back, legs bent and arms at sides as shown. Lift hips up off mat/bed. Slowly lower back down.  Perform 10 reps.  __1-2__ sessions per day.  http://pm.exer.us/55   Copyright  VHI. All rights reserved.    Strengthening: Hip Abductor - Resisted    LYING flat: With red band looped around both legs above knees, push thighs apart. Hold this way for 5 seconds., then slowly bring legs back together. Repeat __10_ times per set. Do __1__ sets per  session. Do __1-2__ sessions per day.  http://orth.exer.us/688   Copyright  VHI. All rights reserved.   Strengthening: Straight Leg Raise (Phase 1)    Tighten muscles on front of right thigh, then lift leg _2-3___ inches from surface, keeping knee locked. Slowly lower leg back down to bed/mat.   Repeat _10___ times each leg. Do 1 sets per session. Do _1-2__ sessions per day.  http://orth.exer.us/614     Copyright  VHI. All rights reserved.   Functional Quadriceps: Sit to Stand    Sit on edge of a high surface :chair/bed, feet flat on floor. Using your arms, stand up tall, extending knees fully. Have a chair or walker in front of you to steady yourself on once standing. Repeat __5__ times per set. Do __1-2_ sets per session. Do __1-2_ sessions per day.  http://orth.exer.us/735   Copyright  VHI. All rights reserved.         PT Education - 09/16/16 1630    Education provided Yes   Education Details HEP for LE strengthening   Person(s) Educated Patient   Methods Explanation;Demonstration;Verbal cues;Handout   Comprehension Verbalized understanding;Returned demonstration;Verbal cues required;Need further instruction          PT Short Term Goals - 09/07/16 1347      PT SHORT TERM GOAL #1   Title Pt will be independent with assistance from caregiver and verbalize understand of initial HEP to further progress toward her goals. (Target Date for all STGs: 10/05/16)   Time 4   Period Weeks   Status New     PT SHORT TERM GOAL #2   Title Pt will improve Berg Balance score to > or = 26/56 to indicate an improvement in static balance.   Time 4   Period Weeks   Status New     PT SHORT TERM GOAL #3   Title Pt will improve TUG time to < or = 32.5 sec indicating an improvemet in functional mobility and balance.   Time 4   Period Weeks   Status New     PT SHORT TERM GOAL #4   Title Pt will improve initial gait speed by 0.6 ft/sec to indicate improvement in  functional mobility and functional status.   Time 4   Period Weeks   Status New     PT SHORT TERM GOAL #5   Title Pt will ambulate 230ft with LRAD and supervision indoors over level surfaces including carpet and hard flors to incr ability to safely ambulate at home.   Time 4   Period Weeks   Status New     Additional Short Term Goals   Additional Short Term Goals Yes     PT SHORT TERM GOAL #6   Title Pt will negotiate 4 steps with B UE support and supervision to improve pt's ability to  safely enter/exit her house.   Time 4   Period Weeks   Status New           PT Long Term Goals - 09/07/16 1354      PT LONG TERM GOAL #1   Title Pt will be independent and verbalize understanding of HEP an on-going fitness plan to maintain progress made in therapy and improve overall health. (Target Date for all LTGs: 11/02/16)   Time 8   Period Weeks   Status New     PT LONG TERM GOAL #2   Title Pt will improve Berg Balance score to > or = 29/56 to indicate an improvement in static balance and incr safety when performing ADLs.   Time 8   Period Weeks   Status New     PT LONG TERM GOAL #3   Title Pt will improve TUG time to < or = 29.5 sec to indicate an incr in functional mobility and balance when performing ADLs.   Time 8   Period Weeks   Status New     PT LONG TERM GOAL #4   Title Pt will improve gait speed from baseline by 1.2 ft/sec to indicate an incr in functional mobility and safety.   Time 8   Period Weeks   Status New     PT LONG TERM GOAL #5   Title Pt will ambulate 517ft with LRAD and supervision outdoors over level and unlevel paved surfaces, ramps, and curbs to incr functional mobility and safety while ambulating in the community.   Time 8   Period Weeks   Status New     Additional Long Term Goals   Additional Long Term Goals Yes     PT LONG TERM GOAL #6   Title Pt will negotiate 8 steps with supervision and unilat UE support to incr safety while  entering/exiting family/friend's home.   Time 8   Period Weeks   Status New             Plan - 09/16/16 1238    Clinical Impression Statement Todays skilled session established baseline values for 10 meter gait speed and intitation of HEP for strengthening. Pt does need continued practice and education for safe use of RW with hand orthotic, however she is more stable with this device vs the quad cane she is currently using. Pt should benefit from continued PT to progress toward unmet goals.                          Rehab Potential Fair   Clinical Impairments Affecting Rehab Potential Chronic stroke status, active ovarian CA, HTN, expressive aphasia,    PT Frequency 2x / week   PT Duration 8 weeks   PT Treatment/Interventions ADLs/Self Care Home Management;Functional mobility training;Stair training;Gait training;DME Instruction;Therapeutic activities;Therapeutic exercise;Balance training;Neuromuscular re-education;Patient/family education;Orthotic Fit/Training;Manual techniques;Energy conservation   PT Next Visit Plan  adjust brakes on w/c (not working), strengthening and balance activities that challenge activity tolerance, gait with RW with hand orthotic   Consulted and Agree with Plan of Care Patient      Patient will benefit from skilled therapeutic intervention in order to improve the following deficits and impairments:  Abnormal gait, Cardiopulmonary status limiting activity, Decreased activity tolerance, Decreased balance, Decreased mobility, Decreased knowledge of use of DME, Decreased knowledge of precautions, Decreased endurance, Decreased safety awareness, Decreased strength, Impaired perceived functional ability, Impaired flexibility, Improper body mechanics, Impaired UE functional use  Visit  Diagnosis: Hemiparesis affecting nondominant side as late effect of cerebrovascular accident (CVA) ([place])  Other abnormalities of gait and mobility  Muscle weakness  (generalized)  Unsteadiness on feet     Problem List Patient Active Problem List   Diagnosis Date Noted  . Genetic testing 08/17/2016  . Leukopenia due to antineoplastic chemotherapy ([place]) 06/05/2016  . Costochondritis, acute 05/02/2016  . Ovarian cancer ([place]) 03/24/2016  . Anemia due to antineoplastic chemotherapy 03/17/2016  . Port catheter in place 03/17/2016  . Portacath in place 02/05/2016  . Chemotherapy-induced peripheral neuropathy ([place]) 02/05/2016  . Hematoma 01/27/2016  . Chronic anticoagulation 01/22/2016  . Chronic atrial fibrillation ([place]) 01/22/2016  . PICC (peripherally inserted central catheter) in place 01/22/2016  . Chemotherapy induced neutropenia ([place]) 01/22/2016  . Poor venous access 01/22/2016  . CVA (cerebral vascular accident) ([place]) 12/27/2015  . Right hemiparesis ([place]) 12/27/2015  . Expressive aphasia 12/27/2015  . History of left breast cancer 12/27/2015  . H/O tubal ligation 12/27/2015  . Colon polyps 12/27/2015  . International Federation of Gynecology and Obstetrics (FIGO) stage IVB epithelial ovarian cancer ([place]) 12/27/2015  . Ovarian cancer, bilateral ([place]) 12/24/2015  . Inguinal adenopathy   . Carcinomatosis ([place]) 12/06/2015  . Essential hypertension, benign 07/18/2014  . Atrial fibrillation, unspecified 07/18/2014  . Anxiety state, unspecified 07/18/2014    Willow Ora, PTA, [facility] [address], [place], [place] [address] [phone] 09/17/16, 12:26 PM   Name: Brenda Cunningham MRN: [id number] Date of Birth: September 19, 1951

## 2016-09-18 ENCOUNTER — Ambulatory Visit: Payer: Medicare Other | Admitting: Physical Therapy

## 2016-09-18 ENCOUNTER — Encounter: Payer: Self-pay | Admitting: Physical Therapy

## 2016-09-18 DIAGNOSIS — R41842 Visuospatial deficit: Secondary | ICD-10-CM | POA: Diagnosis not present

## 2016-09-18 DIAGNOSIS — R2689 Other abnormalities of gait and mobility: Secondary | ICD-10-CM

## 2016-09-18 DIAGNOSIS — M6281 Muscle weakness (generalized): Secondary | ICD-10-CM

## 2016-09-18 DIAGNOSIS — G8929 Other chronic pain: Secondary | ICD-10-CM | POA: Diagnosis not present

## 2016-09-18 DIAGNOSIS — M25611 Stiffness of right shoulder, not elsewhere classified: Secondary | ICD-10-CM | POA: Diagnosis not present

## 2016-09-18 DIAGNOSIS — R2681 Unsteadiness on feet: Secondary | ICD-10-CM | POA: Diagnosis not present

## 2016-09-18 DIAGNOSIS — M25511 Pain in right shoulder: Secondary | ICD-10-CM | POA: Diagnosis not present

## 2016-09-18 DIAGNOSIS — I69353 Hemiplegia and hemiparesis following cerebral infarction affecting right non-dominant side: Secondary | ICD-10-CM | POA: Diagnosis not present

## 2016-09-18 DIAGNOSIS — I69359 Hemiplegia and hemiparesis following cerebral infarction affecting unspecified side: Secondary | ICD-10-CM

## 2016-09-18 NOTE — Therapy (Signed)
[facility] [address] [place], [place], [address] Phone: [phone]   Fax:  [phone]  Physical Therapy Treatment  Patient Details  Name: Brenda Cunningham MRN: [id number] Date of Birth: [birth year] Referring Provider: Helane Rima, MD  Encounter Date: 09/18/2016      PT End of Session - 09/18/16 0852    Visit Number 3   Number of Visits 17   Date for PT Re-Evaluation 11/06/16   Authorization Type Medicare Part A and B   Authorization Time Period G-Code every 10th visit   PT Start Time 0846   PT Stop Time 0932   PT Time Calculation (min) 46 min   Equipment Utilized During Treatment Gait belt   Activity Tolerance Patient tolerated treatment well   Behavior During Therapy [facility] for tasks assessed/performed      Past Medical History:  Diagnosis Date  . A-fib ([place])   . Anxiety   . Arthritis   . Breast cancer ([place]) 2008   s/p lumpectomy, chemo and radiation therapy  . Complication of anesthesia    DIFFICULTY WAKING UP FROM ANESTHESIA  . Hyperlipidemia   . Hypertension   . Stroke ([place]) 07/29/2015  . Wears glasses     Past Surgical History:  Procedure Laterality Date  . APPENDECTOMY    . BREAST LUMPECTOMY  2008   left  . COLONOSCOPY  2004  . DEBULKING N/A 03/24/2016   Procedure: RADICAL TUMOR DEBULKING;  Surgeon: Everitt Amber, MD;  [facility];  Service: Gynecology;  Laterality: N/A;  . OMENTECTOMY N/A 03/24/2016   Procedure: OMENTECTOMY;  Surgeon: Everitt Amber, MD;  [facility]: [facility];  Service: Gynecology;  Laterality: N/A;  . ROBOTIC ASSISTED TOTAL HYSTERECTOMY WITH BILATERAL SALPINGO OOPHERECTOMY Bilateral 03/24/2016   Procedure: XI ROBOTIC ASSISTED TOTA LAPAROSCOPIC  HYSTERECTOMY WITH BILATERAL SALPINGO OOPHORECTOMY;  Surgeon: Everitt Amber, MD;  [facility];  Service: Gynecology;  Laterality: Bilateral;  . TUBAL LIGATION      There were no vitals filed for this visit.      Subjective Assessment  - 09/18/16 0850    Subjective No new compliants. No falls to report. Still with some abdominal pain and indigetion pain today.    Limitations Standing;Walking;House hold activities   Patient Stated Goals "I want to get up and see my daughter and get on the train to see my sons in [place]."   Currently in Pain? Yes   Pain Score 2    Pain Location Abdomen   Pain Descriptors / Indicators Sore;Burning   Pain Type Acute pain   Pain Onset In the past 7 days   Pain Frequency Occasional   Aggravating Factors  still feels it's gas related and related to what she ate for dinner last night   Pain Relieving Factors took antacid last night             [facility] Adult PT Treatment/Exercise - 09/18/16 0853      Transfers   Transfers Sit to Stand;Stand to Sit   Sit to Stand 4: Min guard;With upper extremity assist;From bed;With armrests;From chair/3-in-1   Sit to Stand Details Verbal cues for precautions/safety;Verbal cues for safe use of DME/AE;Verbal cues for sequencing;Verbal cues for technique   Sit to Stand Details (indicate cue type and reason) cues to scoot to edge of mat prior    Stand to Sit 4: Min guard;With upper extremity assist;With armrests;To chair/3-in-1;To bed;Uncontrolled descent   Stand to Sit Details (indicate cue type and reason) Verbal  cues for precautions/safety;Verbal cues for safe use of DME/AE   Stand to Sit Details cues to reach back after undoing velcro strap on hand orthosis initially with good in session carryover, uncontrolled descent with sitting down     Ambulation/Gait   Ambulation/Gait Yes   Ambulation/Gait Assistance 4: Min guard;5: Supervision;4: Min assist   Ambulation/Gait Assistance Details occasional cues on posture, walker position and for increased step length. Pt able to negotiate around obstacles/furniture without assistance for walker manuevering or balance.  Pt does fatigue quickly with increased gait distances needed short seated rest breaks between.  Pt with increased episodes of right knee buckling with walking out of gym at end of session with small based quad cane [facility]) with up to min assist for balance.         Ambulation Distance (Feet) 140 Feet  x1, 195 x1, 190 x1 in/outdoors RW; 80 x1 SBQC   Assistive device Rolling walker;Small based quad cane  with right hand orthotic   Gait Pattern Step-through pattern;Decreased step length - left;Decreased stance time - right;Decreased stride length;Decreased hip/knee flexion - right;Trunk flexed;Narrow base of support;Poor foot clearance - right   Ambulation Surface Level;Indoor   Ramp Other (comment)  min guard assist with RW   Ramp Details (indicate cue type and reason) x 2 outdoor inclines/declines: cues on posture and step length, cues to stay close to [employer] Other (comment)  min guard with RW   Curb Details (indicate cue type and reason) with outdoor curb: cues on sequencing     Knee/Hip Exercises: Standing   Heel Raises Both;1 set;10 reps;Limitations   Heel Raises Limitations wiht UE support on chair back: heel raises then toe raises x 10 reps each with cues on posture and to maintain knee extension   Functional Squat 1 set;10 reps;Limitations   Functional Squat Limitations with UE support on chair back: mini squats x 10 reps with cues on form and technique     Knee/Hip Exercises: Seated   Other Seated Knee/Hip Exercises with sit<>stands listed below: elevated mat table without UE support, min guard assist with uncontrolled descent with 7/10 reps.   Sit to Sand 1 set;10 reps;without UE support             PT Short Term Goals - 09/07/16 1347      PT SHORT TERM GOAL #1   Title Pt will be independent with assistance from caregiver and verbalize understand of initial HEP to further progress toward her goals. (Target Date for all STGs: 10/05/16)   Time 4   Period Weeks   Status New     PT SHORT TERM GOAL #2   Title Pt will improve Berg Balance score to > or = 26/56 to  indicate an improvement in static balance.   Time 4   Period Weeks   Status New     PT SHORT TERM GOAL #3   Title Pt will improve TUG time to < or = 32.5 sec indicating an improvemet in functional mobility and balance.   Time 4   Period Weeks   Status New     PT SHORT TERM GOAL #4   Title Pt will improve initial gait speed by 0.6 ft/sec to indicate improvement in functional mobility and functional status.   Time 4   Period Weeks   Status New     PT SHORT TERM GOAL #5   Title Pt will ambulate 225ft with LRAD and supervision indoors over level surfaces including  carpet and hard flors to incr ability to safely ambulate at home.   Time 4   Period Weeks   Status New     Additional Short Term Goals   Additional Short Term Goals Yes     PT SHORT TERM GOAL #6   Title Pt will negotiate 4 steps with B UE support and supervision to improve pt's ability to safely enter/exit her house.   Time 4   Period Weeks   Status New           PT Long Term Goals - 09/07/16 1354      PT LONG TERM GOAL #1   Title Pt will be independent and verbalize understanding of HEP an on-going fitness plan to maintain progress made in therapy and improve overall health. (Target Date for all LTGs: 11/02/16)   Time 8   Period Weeks   Status New     PT LONG TERM GOAL #2   Title Pt will improve Berg Balance score to > or = 29/56 to indicate an improvement in static balance and incr safety when performing ADLs.   Time 8   Period Weeks   Status New     PT LONG TERM GOAL #3   Title Pt will improve TUG time to < or = 29.5 sec to indicate an incr in functional mobility and balance when performing ADLs.   Time 8   Period Weeks   Status New     PT LONG TERM GOAL #4   Title Pt will improve gait speed from baseline by 1.2 ft/sec to indicate an incr in functional mobility and safety.   Time 8   Period Weeks   Status New     PT LONG TERM GOAL #5   Title Pt will ambulate 560ft with LRAD and supervision  outdoors over level and unlevel paved surfaces, ramps, and curbs to incr functional mobility and safety while ambulating in the community.   Time 8   Period Weeks   Status New     Additional Long Term Goals   Additional Long Term Goals Yes     PT LONG TERM GOAL #6   Title Pt will negotiate 8 steps with supervision and unilat UE support to incr safety while entering/exiting family/friend's home.   Time 8   Period Weeks   Status New           Plan - 09/18/16 [id number]    Clinical Impression Statement Today's skilled session continued to address gait with RW/right hand orthosis including on various surfaces and barriers. Pt continues to demonstrate increased stability with use of RW vs SBQC for gait. Awaiting order from MD to issue RW to pt in clinic (faxed over order today). Remainder of session addressed LE strengthening with rest breaks needed due to fatigue. No pain reported with session. Pt did demo increased right knee buckling at end of session with walking out to lobby, son made aware so to guard pt appropriately for fall prevention. Pt is making slow, steady progress toward goals and should benefit from continued PT to progress toward unmet goals.    Rehab Potential Fair   Clinical Impairments Affecting Rehab Potential Chronic stroke status, active ovarian CA, HTN, expressive aphasia,    PT Frequency 2x / week   PT Duration 8 weeks   PT Treatment/Interventions ADLs/Self Care Home Management;Functional mobility training;Stair training;Gait training;DME Instruction;Therapeutic activities;Therapeutic exercise;Balance training;Neuromuscular re-education;Patient/family education;Orthotic Fit/Training;Manual techniques;Energy conservation   PT Next Visit Plan  adjust brakes on  w/c (not working), strengthening and balance activities that challenge activity tolerance, gait with RW with hand orthotic   Consulted and Agree with Plan of Care Patient      Patient will benefit from skilled  therapeutic intervention in order to improve the following deficits and impairments:  Abnormal gait, Cardiopulmonary status limiting activity, Decreased activity tolerance, Decreased balance, Decreased mobility, Decreased knowledge of use of DME, Decreased knowledge of precautions, Decreased endurance, Decreased safety awareness, Decreased strength, Impaired perceived functional ability, Impaired flexibility, Improper body mechanics, Impaired UE functional use  Visit Diagnosis: Hemiparesis affecting nondominant side as late effect of cerebrovascular accident (CVA) ([place])  Other abnormalities of gait and mobility  Muscle weakness (generalized)  Unsteadiness on feet     Problem List Patient Active Problem List   Diagnosis Date Noted  . Genetic testing 08/17/2016  . Leukopenia due to antineoplastic chemotherapy ([place]) 06/05/2016  . Costochondritis, acute 05/02/2016  . Ovarian cancer ([place]) 03/24/2016  . Anemia due to antineoplastic chemotherapy 03/17/2016  . Port catheter in place 03/17/2016  . Portacath in place 02/05/2016  . Chemotherapy-induced peripheral neuropathy ([place]) 02/05/2016  . Hematoma 01/27/2016  . Chronic anticoagulation 01/22/2016  . Chronic atrial fibrillation ([place]) 01/22/2016  . PICC (peripherally inserted central catheter) in place 01/22/2016  . Chemotherapy induced neutropenia ([place]) 01/22/2016  . Poor venous access 01/22/2016  . CVA (cerebral vascular accident) ([place]) 12/27/2015  . Right hemiparesis ([place]) 12/27/2015  . Expressive aphasia 12/27/2015  . History of left breast cancer 12/27/2015  . H/O tubal ligation 12/27/2015  . Colon polyps 12/27/2015  . International Federation of Gynecology and Obstetrics (FIGO) stage IVB epithelial ovarian cancer ([place]) 12/27/2015  . Ovarian cancer, bilateral ([place]) 12/24/2015  . Inguinal adenopathy   . Carcinomatosis ([place]) 12/06/2015  . Essential hypertension, benign 07/18/2014  . Atrial fibrillation, unspecified 07/18/2014  .  Anxiety state, unspecified 07/18/2014    Willow Ora, PTA, [facility] [address], [place], [place] [address] [phone] 09/18/16, 10:11 AM   Name: Brenda Cunningham MRN: [id number] Date of Birth: 11/08/1951

## 2016-09-21 ENCOUNTER — Encounter: Payer: Self-pay | Admitting: Physical Therapy

## 2016-09-21 ENCOUNTER — Ambulatory Visit: Payer: Medicare Other | Admitting: Physical Therapy

## 2016-09-21 ENCOUNTER — Ambulatory Visit: Payer: Medicare Other | Admitting: Occupational Therapy

## 2016-09-21 ENCOUNTER — Telehealth: Payer: Self-pay

## 2016-09-21 ENCOUNTER — Encounter: Payer: Medicare Other | Admitting: *Deleted

## 2016-09-21 ENCOUNTER — Encounter: Payer: Self-pay | Admitting: Occupational Therapy

## 2016-09-21 DIAGNOSIS — R41842 Visuospatial deficit: Secondary | ICD-10-CM | POA: Diagnosis not present

## 2016-09-21 DIAGNOSIS — G8929 Other chronic pain: Secondary | ICD-10-CM

## 2016-09-21 DIAGNOSIS — R2681 Unsteadiness on feet: Secondary | ICD-10-CM

## 2016-09-21 DIAGNOSIS — M25611 Stiffness of right shoulder, not elsewhere classified: Secondary | ICD-10-CM | POA: Diagnosis not present

## 2016-09-21 DIAGNOSIS — I69359 Hemiplegia and hemiparesis following cerebral infarction affecting unspecified side: Secondary | ICD-10-CM

## 2016-09-21 DIAGNOSIS — R41844 Frontal lobe and executive function deficit: Secondary | ICD-10-CM

## 2016-09-21 DIAGNOSIS — M6281 Muscle weakness (generalized): Secondary | ICD-10-CM

## 2016-09-21 DIAGNOSIS — I69353 Hemiplegia and hemiparesis following cerebral infarction affecting right non-dominant side: Secondary | ICD-10-CM

## 2016-09-21 DIAGNOSIS — M25511 Pain in right shoulder: Secondary | ICD-10-CM | POA: Diagnosis not present

## 2016-09-21 DIAGNOSIS — R2689 Other abnormalities of gait and mobility: Secondary | ICD-10-CM

## 2016-09-21 NOTE — Therapy (Signed)
[facility] [address] [place], [place], [address] Phone: [phone]   Fax:  [phone]  Occupational Therapy Treatment  Patient Details  Name: Brenda Cunningham MRN: [id number] Date of Birth: Feb 26, 1951 Referring Provider: Helane Rima  Encounter Date: 09/21/2016      OT End of Session - 09/21/16 1240    Visit Number 3   Number of Visits 16   Date for OT Re-Evaluation 11/02/16   Authorization Type medicare will need G code and PN every 10th visit   Authorization Time Period 60 days   Authorization - Visit Number 3   Authorization - Number of Visits 10   OT Start Time [phone]   OT Stop Time 0930   OT Time Calculation (min) 44 min   Activity Tolerance Patient tolerated treatment well      Past Medical History:  Diagnosis Date  . A-fib ([place])   . Anxiety   . Arthritis   . Breast cancer ([place]) 2008   s/p lumpectomy, chemo and radiation therapy  . Complication of anesthesia    DIFFICULTY WAKING UP FROM ANESTHESIA  . Hyperlipidemia   . Hypertension   . Stroke ([place]) 07/29/2015  . Wears glasses     Past Surgical History:  Procedure Laterality Date  . APPENDECTOMY    . BREAST LUMPECTOMY  2008   left  . COLONOSCOPY  2004  . DEBULKING N/A 03/24/2016   Procedure: RADICAL TUMOR DEBULKING;  Surgeon: Everitt Amber, MD;  Location: [facility];  Service: Gynecology;  Laterality: N/A;  . OMENTECTOMY N/A 03/24/2016   Procedure: OMENTECTOMY;  Surgeon: Everitt Amber, MD;  Location: [facility];  Service: Gynecology;  Laterality: N/A;  . ROBOTIC ASSISTED TOTAL HYSTERECTOMY WITH BILATERAL SALPINGO OOPHERECTOMY Bilateral 03/24/2016   Procedure: XI ROBOTIC ASSISTED TOTA LAPAROSCOPIC  HYSTERECTOMY WITH BILATERAL SALPINGO OOPHORECTOMY;  Surgeon: Everitt Amber, MD;  Location: [facility];  Service: Gynecology;  Laterality: Bilateral;  . TUBAL LIGATION      There were no vitals filed for this visit.      Subjective Assessment - 09/21/16 0853    Subjective  I don't feel very good today - I have a pain   Pertinent History see epic snapshot - PT WITH ACTIVE OVARIAN CANCER and cardiomegaly;  high risk for recurrence of stroke.    Patient Stated Goals Walk and my hand - I like being here.  I want to be able to walk in to my dt's house by November.   Currently in Pain? Yes   Pain Score 4    Pain Location --  just below the my breasts   Pain Orientation Right;Left   Pain Descriptors / Indicators Pressure  it feels like gas but it has been there for a month   Pain Type Chronic pain   Pain Onset 1 to 4 weeks ago   Pain Frequency Intermittent   Aggravating Factors  when I lay on my back   Pain Relieving Factors if I take an antacid it seems to help a little bit   Multiple Pain Sites No                      OT Treatments/Exercises (OP) - 09/21/16 1234      ADLs   Functional Mobility Addressed functional mobility today with emphasis on dynamic standing balance, use of RW, incoroprating RUE, and safety. PT very unsteady today and required intermittent min a with RW and constant min a with quad cane.  Splinting   Splinting Began fabrication of wrist cock up splint as pt has ability to place RUE into space (low to beginning mid reach) and some ability to grasp using tone.  Wrist cock up should allow pt to use RUE as stabilizer during basic tasks. Will complete next session.                   OT Short Term Goals - 09/21/16 1237      OT SHORT TERM GOAL #1   Title Pt and family will be mod I with HEP -    Status On-going     OT SHORT TERM GOAL #2   Title Pt will report no more than 3/10 pain in R shoulder with overhead reach   Status On-going     OT SHORT TERM GOAL #3   Title Pt will demonstrate 80* of shoulder flexion and abduction in RUE for overhead reach   Status On-going     OT SHORT TERM GOAL #4   Title Pt will require no more than supervision for static standing balance in prep for functional  activity in standing   Status On-going     OT SHORT TERM GOAL #5   Title Pt will be mod I with hiking pants 100% of the time.   Status On-going           OT Long Term Goals - 09/21/16 1237      OT LONG TERM GOAL #1   Title Pt and family will be mod I with upgraded HEP - 11/03/2015   Status On-going     OT LONG TERM GOAL #2   Title Pt will require no more than min a for dynamic standing balance during functional activity   Status On-going     OT LONG TERM GOAL #3   Title Pt will report no than 2/10 pain with overhead reach   Status On-going     OT LONG TERM GOAL #4   Title Pt will demonstrate 85* of shoulder flexion for overhead reach   Status On-going     OT LONG TERM GOAL #5   Title Pt will demonstrate 80* of abduction for overhead reach   Status On-going     OT LONG TERM GOAL #6   Title Pt will be supevsion only for tub bench transfers   Status On-going     OT LONG TERM GOAL #7   Title Pt will use RUE as stabilzer with splint during basic ADL tasks prn   Status On-going     OT LONG TERM GOAL #8   Title Pt will tolerate at least 10 minutes of standing activity without requiring rest break.   Status On-going               Plan - 09/21/16 1237    Clinical Impression Statement Pt very unsteady today when she arrived in the clinic.  Pt also stated she has had pain in her abdomen for almost a month but has not shared with MD. Encouraged pt to call MD today to make them aware and pt agreed.  Pt with slow progress toward goals.    Rehab Potential Fair   Clinical Impairments Affecting Rehab Potential given severity of deficits, medical status   OT Frequency 2x / week   OT Duration 8 weeks   OT Treatment/Interventions Self-care/ADL training;Electrical Stimulation;Moist Heat;DME and/or AE instruction;Neuromuscular education;Therapeutic exercise;Functional Mobility Training;Manual Therapy;Passive range of motion;Splinting;Therapeutic activities;Balance  training;Patient/family education;Cognitive remediation/compensation;Visual/perceptual remediation/compensation   Plan  complete cock up splint, NMR For RUE, address pain in R shoulder (NO HEAT), NMR for balance and functional mobitlity, activity tolerance   Consulted and Agree with Plan of Care Patient      Patient will benefit from skilled therapeutic intervention in order to improve the following deficits and impairments:  Abnormal gait, Cardiopulmonary status limiting activity, Decreased activity tolerance, Decreased balance, Decreased cognition, Decreased mobility, Decreased range of motion, Decreased safety awareness, Difficulty walking, Decreased strength, Impaired UE functional use, Impaired tone, Impaired sensation, Impaired vision/preception, Pain  Visit Diagnosis: Hemiplegia and hemiparesis following cerebral infarction affecting right non-dominant side (HCC)  Stiffness of right shoulder, not elsewhere classified  Chronic right shoulder pain  Visuospatial deficit  Frontal lobe and executive function deficit  Unsteadiness on feet    Problem List Patient Active Problem List   Diagnosis Date Noted  . Genetic testing 08/17/2016  . Leukopenia due to antineoplastic chemotherapy ([place]) 06/05/2016  . Costochondritis, acute 05/02/2016  . Ovarian cancer ([place]) 03/24/2016  . Anemia due to antineoplastic chemotherapy 03/17/2016  . Port catheter in place 03/17/2016  . Portacath in place 02/05/2016  . Chemotherapy-induced peripheral neuropathy ([place]) 02/05/2016  . Hematoma 01/27/2016  . Chronic anticoagulation 01/22/2016  . Chronic atrial fibrillation ([place]) 01/22/2016  . PICC (peripherally inserted central catheter) in place 01/22/2016  . Chemotherapy induced neutropenia ([place]) 01/22/2016  . Poor venous access 01/22/2016  . CVA (cerebral vascular accident) ([place]) 12/27/2015  . Right hemiparesis ([place]) 12/27/2015  . Expressive aphasia 12/27/2015  . History of left breast cancer  12/27/2015  . H/O tubal ligation 12/27/2015  . Colon polyps 12/27/2015  . International Federation of Gynecology and Obstetrics (FIGO) stage IVB epithelial ovarian cancer ([place]) 12/27/2015  . Ovarian cancer, bilateral ([place]) 12/24/2015  . Inguinal adenopathy   . Carcinomatosis ([place]) 12/06/2015  . Essential hypertension, benign 07/18/2014  . Atrial fibrillation, unspecified 07/18/2014  . Anxiety state, unspecified 07/18/2014    Quay Burow, OTR/L 09/21/2016, 12:43 PM  [facility] [address] [place], [place], [address] Phone: [phone]   Fax:  [phone]  Name: Brenda Cunningham MRN: [id number] Date of Birth: 1951/08/02

## 2016-09-21 NOTE — Telephone Encounter (Signed)
Spoke with daughter Kenney Houseman and told her the results of the labs as noted below.

## 2016-09-21 NOTE — Telephone Encounter (Signed)
Kenney Houseman also stated that her mother cc of pain in her right abdomen and under right rib cage.  On set this past Saturday. Ms Reichard took gas-x without relief. She used 2 tramadol  with good effect. Bowels are moving well. [name] wanted to see if CT scan can be moved up and or patient come in to be evaluated.

## 2016-09-21 NOTE — Therapy (Signed)
[facility] [address] [place], [place], [address] Phone: [phone]   Fax:  [phone]  Physical Therapy Treatment  Patient Details  Name: Ndidi Ramaswamy MRN: [id number] Date of Birth: 09-03-1951 Referring Provider: Helane Rima, MD  Encounter Date: 09/21/2016      PT End of Session - 09/21/16 0942    Visit Number 4   Number of Visits 17   Date for PT Re-Evaluation 11/06/16   Authorization Type Medicare Part A and B   Authorization Time Period G-Code every 10th visit   PT Start Time 0935   PT Stop Time 1018   PT Time Calculation (min) 43 min   Equipment Utilized During Treatment Gait belt   Activity Tolerance Patient tolerated treatment well   Behavior During Therapy WFL for tasks assessed/performed      Past Medical History:  Diagnosis Date  . A-fib ([place])   . Anxiety   . Arthritis   . Breast cancer ([place]) 2008   s/p lumpectomy, chemo and radiation therapy  . Complication of anesthesia    DIFFICULTY WAKING UP FROM ANESTHESIA  . Hyperlipidemia   . Hypertension   . Stroke ([place]) 07/29/2015  . Wears glasses     Past Surgical History:  Procedure Laterality Date  . APPENDECTOMY    . BREAST LUMPECTOMY  2008   left  . COLONOSCOPY  2004  . DEBULKING N/A 03/24/2016   Procedure: RADICAL TUMOR DEBULKING;  Surgeon: Everitt Amber, MD;  [facility];  Service: Gynecology;  Laterality: N/A;  . OMENTECTOMY N/A 03/24/2016   Procedure: OMENTECTOMY;  Surgeon: Everitt Amber, MD;  [facility];  Service: Gynecology;  Laterality: N/A;  . ROBOTIC ASSISTED TOTAL HYSTERECTOMY WITH BILATERAL SALPINGO OOPHERECTOMY Bilateral 03/24/2016   Procedure: XI ROBOTIC ASSISTED TOTA LAPAROSCOPIC  HYSTERECTOMY WITH BILATERAL SALPINGO OOPHORECTOMY;  Surgeon: Everitt Amber, MD;  [facility];  Service: Gynecology;  Laterality: Bilateral;  . TUBAL LIGATION      There were no vitals filed for this visit.      Subjective Assessment  - 09/21/16 0940    Subjective No new compliants. No falls to report. Still with some abdominal and chest/breast wall pain today. Did not take a pain pill today, only her vertigo medicine.    Limitations Standing;Walking;House hold activities   Patient Stated Goals "I want to get up and see my daughter and get on the train to see my sons in [place]."   Currently in Pain? Yes   Pain Score 4    Pain Location Generalized  just below my breasts/abdomen area   Pain Orientation Right;Left   Pain Descriptors / Indicators Other (Comment)  feels like gas pain, however has been there for awhile   Pain Type Chronic pain   Pain Onset 1 to 4 weeks ago   Pain Frequency Intermittent   Aggravating Factors  lying on back   Pain Relieving Factors antacid helps sometimes             [facility] Adult PT Treatment/Exercise - 09/21/16 0943      Transfers   Transfers Sit to Stand;Stand to Sit   Sit to Stand 4: Min guard;With upper extremity assist;From bed;With armrests;From chair/3-in-1   Sit to Stand Details Verbal cues for precautions/safety;Verbal cues for safe use of DME/AE;Verbal cues for sequencing;Verbal cues for technique   Stand to Sit 4: Min guard;With upper extremity assist;With armrests;To chair/3-in-1;To bed;Uncontrolled descent   Stand to Sit Details (indicate cue type and reason)  Verbal cues for precautions/safety;Verbal cues for safe use of DME/AE     Ambulation/Gait   Ambulation/Gait Yes   Ambulation/Gait Assistance 4: Min guard;5: Supervision   Ambulation/Gait Assistance Details cues on posture and walker position with gait. obstacle negotiation incorporated in 1st gait rep. surface transitions (tile, carpet, raised thresholds) incorporated into 2cd gait trial.                    Ambulation Distance (Feet) 220 Feet  x1, 440 x1, 110 x1   Assistive device Rolling walker  with off shelf right hand orthotic   Gait Pattern Step-through pattern;Decreased step length - left;Decreased stance  time - right;Decreased stride length;Decreased hip/knee flexion - right;Trunk flexed;Narrow base of support;Poor foot clearance - right   Ambulation Surface Level;Indoor   Ramp Other (comment);4: Min assist  min guard assist   Ramp Details (indicate cue type and reason) x1 on indoor ramp with RW/right hand orthotic   Curb Other (comment)  min guard assist    Curb Details (indicate cue type and reason) x1 on indoor 6 inch curb with RW/right hand orthotic           PT Education - 09/21/16 1020    Education provided Yes   Education Details advised pt to call MD about abdominal pain as it has not improved in awhile;issued RW order to son along with information on where to go to get one. He was planning to head over to [place] care after here. Son shown how to use hand orthotic as well.    Person(s) Educated Patient;Child(ren)   Methods Explanation;Demonstration;Verbal cues;Handout   Comprehension Verbalized understanding;Returned demonstration          PT Short Term Goals - 09/07/16 1347      PT SHORT TERM GOAL #1   Title Pt will be independent with assistance from caregiver and verbalize understand of initial HEP to further progress toward her goals. (Target Date for all STGs: 10/05/16)   Time 4   Period Weeks   Status New     PT SHORT TERM GOAL #2   Title Pt will improve Berg Balance score to > or = 26/56 to indicate an improvement in static balance.   Time 4   Period Weeks   Status New     PT SHORT TERM GOAL #3   Title Pt will improve TUG time to < or = 32.5 sec indicating an improvemet in functional mobility and balance.   Time 4   Period Weeks   Status New     PT SHORT TERM GOAL #4   Title Pt will improve initial gait speed by 0.6 ft/sec to indicate improvement in functional mobility and functional status.   Time 4   Period Weeks   Status New     PT SHORT TERM GOAL #5   Title Pt will ambulate 21ft with LRAD and supervision indoors over level surfaces  including carpet and hard flors to incr ability to safely ambulate at home.   Time 4   Period Weeks   Status New     Additional Short Term Goals   Additional Short Term Goals Yes     PT SHORT TERM GOAL #6   Title Pt will negotiate 4 steps with B UE support and supervision to improve pt's ability to safely enter/exit her house.   Time 4   Period Weeks   Status New           PT Long Term  Goals - 09/07/16 1354      PT LONG TERM GOAL #1   Title Pt will be independent and verbalize understanding of HEP an on-going fitness plan to maintain progress made in therapy and improve overall health. (Target Date for all LTGs: 11/02/16)   Time 8   Period Weeks   Status New     PT LONG TERM GOAL #2   Title Pt will improve Berg Balance score to > or = 29/56 to indicate an improvement in static balance and incr safety when performing ADLs.   Time 8   Period Weeks   Status New     PT LONG TERM GOAL #3   Title Pt will improve TUG time to < or = 29.5 sec to indicate an incr in functional mobility and balance when performing ADLs.   Time 8   Period Weeks   Status New     PT LONG TERM GOAL #4   Title Pt will improve gait speed from baseline by 1.2 ft/sec to indicate an incr in functional mobility and safety.   Time 8   Period Weeks   Status New     PT LONG TERM GOAL #5   Title Pt will ambulate 571ft with LRAD and supervision outdoors over level and unlevel paved surfaces, ramps, and curbs to incr functional mobility and safety while ambulating in the community.   Time 8   Period Weeks   Status New     Additional Long Term Goals   Additional Long Term Goals Yes     PT LONG TERM GOAL #6   Title Pt will negotiate 8 steps with supervision and unilat UE support to incr safety while entering/exiting family/friend's home.   Time 8   Period Weeks   Status New           Plan - 09/21/16 0942    Clinical Impression Statement Today's skilled session continued to address gait with  RW/right hand orthosis. Pt progressed from min guard assist to supervision with gait on indoor level surfaces. Pt/son to go and get her personal RW today ([place] order given to pt's son). Pt is making steady progress toward goals and should benefit from continued PT to progress toward unmet goals.                             Rehab Potential Fair   Clinical Impairments Affecting Rehab Potential Chronic stroke status, active ovarian CA, HTN, expressive aphasia,    PT Frequency 2x / week   PT Duration 8 weeks   PT Treatment/Interventions ADLs/Self Care Home Management;Functional mobility training;Stair training;Gait training;DME Instruction;Therapeutic activities;Therapeutic exercise;Balance training;Neuromuscular re-education;Patient/family education;Orthotic Fit/Training;Manual techniques;Energy conservation   PT Next Visit Plan  adjust brakes on w/c (not working), strengthening and balance activities that challenge activity tolerance, gait with RW with hand orthotic on compliant surfaces and continue to work on obstacle negotiation/barriers   Consulted and Agree with Plan of Care Patient      Patient will benefit from skilled therapeutic intervention in order to improve the following deficits and impairments:  Abnormal gait, Cardiopulmonary status limiting activity, Decreased activity tolerance, Decreased balance, Decreased mobility, Decreased knowledge of use of DME, Decreased knowledge of precautions, Decreased endurance, Decreased safety awareness, Decreased strength, Impaired perceived functional ability, Impaired flexibility, Improper body mechanics, Impaired UE functional use  Visit Diagnosis: Hemiparesis affecting nondominant side as late effect of cerebrovascular accident (CVA) ([place])  Other abnormalities of gait and  mobility  Muscle weakness (generalized)  Unsteadiness on feet     Problem List Patient Active Problem List   Diagnosis Date Noted  . Genetic testing 08/17/2016  .  Leukopenia due to antineoplastic chemotherapy ([place]) 06/05/2016  . Costochondritis, acute 05/02/2016  . Ovarian cancer ([place]) 03/24/2016  . Anemia due to antineoplastic chemotherapy 03/17/2016  . Port catheter in place 03/17/2016  . Portacath in place 02/05/2016  . Chemotherapy-induced peripheral neuropathy ([place]) 02/05/2016  . Hematoma 01/27/2016  . Chronic anticoagulation 01/22/2016  . Chronic atrial fibrillation ([place]) 01/22/2016  . PICC (peripherally inserted central catheter) in place 01/22/2016  . Chemotherapy induced neutropenia ([place]) 01/22/2016  . Poor venous access 01/22/2016  . CVA (cerebral vascular accident) ([place]) 12/27/2015  . Right hemiparesis ([place]) 12/27/2015  . Expressive aphasia 12/27/2015  . History of left breast cancer 12/27/2015  . H/O tubal ligation 12/27/2015  . Colon polyps 12/27/2015  . International Federation of Gynecology and Obstetrics (FIGO) stage IVB epithelial ovarian cancer ([place]) 12/27/2015  . Ovarian cancer, bilateral ([place]) 12/24/2015  . Inguinal adenopathy   . Carcinomatosis ([place]) 12/06/2015  . Essential hypertension, benign 07/18/2014  . Atrial fibrillation, unspecified 07/18/2014  . Anxiety state, unspecified 07/18/2014    Willow Ora, PTA, [facility] [address], [place], [place] [address] [phone] 09/21/16, 10:25 AM   Name: Tamkia Gallick MRN: [id number] Date of Birth: 1951-01-22

## 2016-09-21 NOTE — Telephone Encounter (Signed)
-----   Message from Gordy Levan, MD sent at 09/19/2016  8:42 AM EDT ----- Labs seen and need follow up please let her know blood counts have improved, hemoglobin now 12.6. She has next flush scheduled before she sees Dr Denman George, will need to keep the [facility] flushed every 6-8 weeks when not otherwise used so may need more appointments set up later

## 2016-09-22 NOTE — Therapy (Signed)
[facility] [address] [place], [place], [address] Phone: [phone]   Fax:  [phone]  Patient Details  Name: Brenda Cunningham MRN: [id number] Date of Birth: 1951-09-30 Referring Provider:  No ref. provider found  Encounter Date: 09/22/2016  Discharge summary from skilled ST services 09-20-15 to 11-27-15 Pt underwent 7 skilled ST sessions during this time. Please see note from 11-27-15 for latest goal update (below). SLP Short Term Goals - 11/27/15 1413     SLP SHORT TERM GOAL #1    Title pt will generate appropriate language to participate in simple converastion functionally with occasional  min A    Time 4    Period Weeks  or until visit number 16 (for all STGs)    Status Not Met  all goals ongoing for renewal dated 11-20-15    SLP SHORT TERM GOAL #2    Title pt will generate sentence level responses in structured tasks with occasional min A    Time 4    Period Weeks    Status Not Met    SLP SHORT TERM GOAL #3    Title pt will demo understanding of complex commands with extra time and rare min A    Time 4    Period Weeks    Status Not Met    SLP SHORT TERM GOAL #4    Title demo understanding of longer verbal stimuli/mod complex conversation 80% with occasional min A    Time 4    Period Weeks    Status Not Met    SLP SHORT TERM GOAL #5    Title pt will attempt to use multimodal communicaiton when verbal communication unsuccessful/difficult     Time 4    Period Weeks    Status Not Met                   SLP Long Term Goals - 11/27/15 1413     SLP LONG TERM GOAL #1    Title pt will engage in simple to mod complex conversation functionally with modified independence (multimodal communication, compensations, extra time) with rare min A    Time 8    Period Weeks  goals remain "ongoing" for 8 more weeks beginning 11-20-15 (01-20-16)    Status Not Met    SLP LONG TERM GOAL #2    Title pt will demo  understanding of longer or mod complex verbal stimuli with modified independence    Time 8    Period Weeks    Status Not Met    SLP LONG TERM GOAL #3    Title pt will perform HEP for motor speech/planning with 80% success and compensations used    Time 8    Period Weeks    Status Not Met    SLP LONG TERM GOAL #4    Title pt will demo understanding of 3-4 sentence paragraph level written information with modified independence    Time 8    Period Weeks    Status Deferred  due to work with verbal expression     She cancelled remaining appointments due to a diagnosis of ovarian cancer and requiring treatment for that. She was placed on hold and is now being formally discharged from that plan of care.   [facility] 09/22/2016, 5:09 PM  [facility] [address] [place], [place], [address] Phone: [phone]   Fax:  [phone]

## 2016-09-22 NOTE — Telephone Encounter (Signed)
Spoke with Brenda Cunningham and told her that the CT scan was moved to 09-30-16 at [facility] [place]. Npo 4 hrs prior. Drink 1st bottle of contrast at 7 am and second bottle at 8 am. She will have lab and flush at [facility] at 0800 on 09-30-16 for CA 125 for visit with Dr. Denman George on 10-05-16 at1130.  She nees to register at 1100 for appointment.  [name] verbalized understanding. Told [name] to have her mother use miralax for bowels to make sure they are moving well especially using the tramadol for pain.  [name] verbalized understanding.

## 2016-09-23 ENCOUNTER — Ambulatory Visit: Payer: Medicare Other

## 2016-09-23 ENCOUNTER — Ambulatory Visit: Payer: Medicare Other | Admitting: Physical Therapy

## 2016-09-23 VITALS — BP 145/93 | HR 68

## 2016-09-23 DIAGNOSIS — R2681 Unsteadiness on feet: Secondary | ICD-10-CM | POA: Diagnosis not present

## 2016-09-23 DIAGNOSIS — M25511 Pain in right shoulder: Secondary | ICD-10-CM | POA: Diagnosis not present

## 2016-09-23 DIAGNOSIS — G8929 Other chronic pain: Secondary | ICD-10-CM | POA: Diagnosis not present

## 2016-09-23 DIAGNOSIS — R41842 Visuospatial deficit: Secondary | ICD-10-CM | POA: Diagnosis not present

## 2016-09-23 DIAGNOSIS — R4701 Aphasia: Secondary | ICD-10-CM

## 2016-09-23 DIAGNOSIS — I69353 Hemiplegia and hemiparesis following cerebral infarction affecting right non-dominant side: Secondary | ICD-10-CM

## 2016-09-23 DIAGNOSIS — M25611 Stiffness of right shoulder, not elsewhere classified: Secondary | ICD-10-CM | POA: Diagnosis not present

## 2016-09-23 DIAGNOSIS — M6281 Muscle weakness (generalized): Secondary | ICD-10-CM

## 2016-09-23 DIAGNOSIS — R2689 Other abnormalities of gait and mobility: Secondary | ICD-10-CM

## 2016-09-23 NOTE — Therapy (Signed)
[facility] [address] [place], [place], [address] Phone: [phone]   Fax:  [phone]  Physical Therapy Treatment  Patient Details  Name: Brenda Cunningham MRN: [id number] Date of Birth: Jan 07, 1951 Referring Provider: Helane Rima, MD  Encounter Date: 09/23/2016      PT End of Session - 09/23/16 1112    Visit Number 5   Number of Visits 17   Date for PT Re-Evaluation 11/06/16   Authorization Type Medicare Part A and B   Authorization Time Period G-Code every 10th visit   PT Start Time 1023   PT Stop Time 1106   PT Time Calculation (min) 43 min   Equipment Utilized During Treatment Gait belt   Activity Tolerance Patient tolerated treatment well   Behavior During Therapy WFL for tasks assessed/performed      Past Medical History:  Diagnosis Date  . A-fib ([place])   . Anxiety   . Arthritis   . Breast cancer ([place]) 2008   s/p lumpectomy, chemo and radiation therapy  . Complication of anesthesia    DIFFICULTY WAKING UP FROM ANESTHESIA  . Hyperlipidemia   . Hypertension   . Stroke ([place]) 07/29/2015  . Wears glasses     Past Surgical History:  Procedure Laterality Date  . APPENDECTOMY    . BREAST LUMPECTOMY  2008   left  . COLONOSCOPY  2004  . DEBULKING N/A 03/24/2016   Procedure: RADICAL TUMOR DEBULKING;  Surgeon: Everitt Amber, MD;  Location: [facility];  Service: Gynecology;  Laterality: N/A;  . OMENTECTOMY N/A 03/24/2016   Procedure: OMENTECTOMY;  Surgeon: Everitt Amber, MD;  Location: [facility];  Service: Gynecology;  Laterality: N/A;  . ROBOTIC ASSISTED TOTAL HYSTERECTOMY WITH BILATERAL SALPINGO OOPHERECTOMY Bilateral 03/24/2016   Procedure: XI ROBOTIC ASSISTED TOTA LAPAROSCOPIC  HYSTERECTOMY WITH BILATERAL SALPINGO OOPHORECTOMY;  Surgeon: Everitt Amber, MD;  Location: [facility];  Service: Gynecology;  Laterality: Bilateral;  . TUBAL LIGATION      Vitals:   09/23/16 1031 09/23/16 1110  BP: (!) 156/90 beginning of  session (!) 145/93 end of session  Pulse: 68         Subjective Assessment - 09/23/16 1032    Subjective Not feeling well when having SLP therapy today. Does not have a working BP machine but thinks that she may be able to get one this week.  Does not have the chest pain' "TUMS helped."   How long can you stand comfortably?     Currently in Pain? No/denies   Pain Location Groin                         [facility] Adult PT Treatment/Exercise - 09/23/16 0001      Knee/Hip Exercises: Machines for Strengthening   Cybex Leg Press 30#, 10x2 LLE, facilitation for knee control     Knee/Hip Exercises: Seated   Hamstring Curl Strengthening;Right;2 sets;10 reps  yellow band             Balance Exercises - 09/23/16 1055      Balance Exercises: Standing   Standing Eyes Opened Wide (BOA)  Upper trunk rotations min A   Sit to Stand Time Min to mod A without UE support, cues for body mechanics           PT Education - 09/23/16 1110    Education provided Yes   Education Details Importance on getting a BP machine that works, checking BP regularly, and notifying PCP  if consistently high.   Person(s) Educated Patient   Methods Explanation   Comprehension Verbalized understanding          PT Short Term Goals - 09/07/16 1347      PT SHORT TERM GOAL #1   Title Pt will be independent with assistance from caregiver and verbalize understand of initial HEP to further progress toward her goals. (Target Date for all STGs: 10/05/16)   Time 4   Period Weeks   Status New     PT SHORT TERM GOAL #2   Title Pt will improve Berg Balance score to > or = 26/56 to indicate an improvement in static balance.   Time 4   Period Weeks   Status New     PT SHORT TERM GOAL #3   Title Pt will improve TUG time to < or = 32.5 sec indicating an improvemet in functional mobility and balance.   Time 4   Period Weeks   Status New     PT SHORT TERM GOAL #4   Title Pt will improve initial  gait speed by 0.6 ft/sec to indicate improvement in functional mobility and functional status.   Time 4   Period Weeks   Status New     PT SHORT TERM GOAL #5   Title Pt will ambulate 247ft with LRAD and supervision indoors over level surfaces including carpet and hard flors to incr ability to safely ambulate at home.   Time 4   Period Weeks   Status New     Additional Short Term Goals   Additional Short Term Goals Yes     PT SHORT TERM GOAL #6   Title Pt will negotiate 4 steps with B UE support and supervision to improve pt's ability to safely enter/exit her house.   Time 4   Period Weeks   Status New           PT Long Term Goals - 09/07/16 1354      PT LONG TERM GOAL #1   Title Pt will be independent and verbalize understanding of HEP an on-going fitness plan to maintain progress made in therapy and improve overall health. (Target Date for all LTGs: 11/02/16)   Time 8   Period Weeks   Status New     PT LONG TERM GOAL #2   Title Pt will improve Berg Balance score to > or = 29/56 to indicate an improvement in static balance and incr safety when performing ADLs.   Time 8   Period Weeks   Status New     PT LONG TERM GOAL #3   Title Pt will improve TUG time to < or = 29.5 sec to indicate an incr in functional mobility and balance when performing ADLs.   Time 8   Period Weeks   Status New     PT LONG TERM GOAL #4   Title Pt will improve gait speed from baseline by 1.2 ft/sec to indicate an incr in functional mobility and safety.   Time 8   Period Weeks   Status New     PT LONG TERM GOAL #5   Title Pt will ambulate 521ft with LRAD and supervision outdoors over level and unlevel paved surfaces, ramps, and curbs to incr functional mobility and safety while ambulating in the community.   Time 8   Period Weeks   Status New     Additional Long Term Goals   Additional Long Term Goals Yes  PT LONG TERM GOAL #6   Title Pt will negotiate 8 steps with supervision and  unilat UE support to incr safety while entering/exiting family/friend's home.   Time 8   Period Weeks   Status New               Plan - 09/23/16 1113    Clinical Impression Statement Checked pt's BP today due to pt not feeling well in earlier SLP session. Pt reporting that she hadn't been able to check it recently due to machine being broken.  Initally 156/90; no symptoms during session; end of session 145/93.  Progressed standing balance activities without UE support; pt requiring min to mod A.   Rehab Potential Fair   Clinical Impairments Affecting Rehab Potential Chronic stroke status, active ovarian CA, HTN, expressive aphasia,    PT Frequency 2x / week   PT Duration 8 weeks   PT Treatment/Interventions ADLs/Self Care Home Management;Functional mobility training;Stair training;Gait training;DME Instruction;Therapeutic activities;Therapeutic exercise;Balance training;Neuromuscular re-education;Patient/family education;Orthotic Fit/Training;Manual techniques;Energy conservation   PT Next Visit Plan LE strengthening and balance activities that challenge activity tolerance, gait with RW with hand orthotic on compliant surfaces and continue to work on obstacle negotiation/barriers   Consulted and Agree with Plan of Care Patient      Patient will benefit from skilled therapeutic intervention in order to improve the following deficits and impairments:  Abnormal gait, Cardiopulmonary status limiting activity, Decreased activity tolerance, Decreased balance, Decreased mobility, Decreased knowledge of use of DME, Decreased knowledge of precautions, Decreased endurance, Decreased safety awareness, Decreased strength, Impaired perceived functional ability, Impaired flexibility, Improper body mechanics, Impaired UE functional use  Visit Diagnosis: Hemiplegia and hemiparesis following cerebral infarction affecting right non-dominant side (HCC)  Unsteadiness on feet  Other abnormalities of gait  and mobility  Muscle weakness (generalized)     Problem List Patient Active Problem List   Diagnosis Date Noted  . Genetic testing 08/17/2016  . Leukopenia due to antineoplastic chemotherapy ([place]) 06/05/2016  . Costochondritis, acute 05/02/2016  . Ovarian cancer ([place]) 03/24/2016  . Anemia due to antineoplastic chemotherapy 03/17/2016  . Port catheter in place 03/17/2016  . Portacath in place 02/05/2016  . Chemotherapy-induced peripheral neuropathy ([place]) 02/05/2016  . Hematoma 01/27/2016  . Chronic anticoagulation 01/22/2016  . Chronic atrial fibrillation ([place]) 01/22/2016  . PICC (peripherally inserted central catheter) in place 01/22/2016  . Chemotherapy induced neutropenia ([place]) 01/22/2016  . Poor venous access 01/22/2016  . CVA (cerebral vascular accident) ([place]) 12/27/2015  . Right hemiparesis ([place]) 12/27/2015  . Expressive aphasia 12/27/2015  . History of left breast cancer 12/27/2015  . H/O tubal ligation 12/27/2015  . Colon polyps 12/27/2015  . International Federation of Gynecology and Obstetrics (FIGO) stage IVB epithelial ovarian cancer ([place]) 12/27/2015  . Ovarian cancer, bilateral ([place]) 12/24/2015  . Inguinal adenopathy   . Carcinomatosis ([place]) 12/06/2015  . Essential hypertension, benign 07/18/2014  . Atrial fibrillation, unspecified 07/18/2014  . Anxiety state, unspecified 07/18/2014   Bjorn Loser, PTA  09/23/16, 11:19 AM [facility] [address] [place], [place], [address] Phone: [phone]   Fax:  [phone]  Name: Brenda Cunningham MRN: [id number] Date of Birth: 07-05-1951

## 2016-09-24 ENCOUNTER — Encounter: Payer: Self-pay | Admitting: Occupational Therapy

## 2016-09-24 ENCOUNTER — Ambulatory Visit: Payer: Medicare Other | Admitting: Occupational Therapy

## 2016-09-24 DIAGNOSIS — R2681 Unsteadiness on feet: Secondary | ICD-10-CM | POA: Diagnosis not present

## 2016-09-24 DIAGNOSIS — I69353 Hemiplegia and hemiparesis following cerebral infarction affecting right non-dominant side: Secondary | ICD-10-CM | POA: Diagnosis not present

## 2016-09-24 DIAGNOSIS — M25611 Stiffness of right shoulder, not elsewhere classified: Secondary | ICD-10-CM | POA: Diagnosis not present

## 2016-09-24 DIAGNOSIS — G8929 Other chronic pain: Secondary | ICD-10-CM | POA: Diagnosis not present

## 2016-09-24 DIAGNOSIS — M25511 Pain in right shoulder: Secondary | ICD-10-CM | POA: Diagnosis not present

## 2016-09-24 DIAGNOSIS — R41842 Visuospatial deficit: Secondary | ICD-10-CM

## 2016-09-24 DIAGNOSIS — R41844 Frontal lobe and executive function deficit: Secondary | ICD-10-CM

## 2016-09-24 DIAGNOSIS — M6281 Muscle weakness (generalized): Secondary | ICD-10-CM

## 2016-09-24 NOTE — Patient Instructions (Signed)
Your Splint This splint should initially be fitted by a healthcare practitioner.  The healthcare practitioner is responsible for providing wearing instructions and precautions to the patient, other healthcare practitioners and care provider involved in the patient's care.  This splint was custom made for you. Please read the following instructions to learn about wearing and caring for your splint.  Precautions Should your splint cause any of the following problems, remove the splint immediately and contact your therapist/physician.  Swelling  Severe Pain  Pressure Areas  Stiffness  Numbness  Do not wear your splint while operating machinery unless it has been fabricated for that purpose.  When To Wear Your Splint Where your splint according to your therapist/physician instructions.  You will build up to wearing the splint listed below. START by wearing the splint for 20 minutes 3 times per day when you are practicing holding items, squeezing items and moving items.  Then you can wear as listed below.  You will need to take it off when walk with the walker.    Daytime for 2 hours on 2 hours off.  Make sure you check your skin for the things listed above when you take off the splint. DO NOT WEAR THE SPLINT AT NIGHT.    Care and Cleaning of Your Splint 1. Keep your splint away from open flames. 2. Your splint will lose its shape in temperatures over 135 degrees Farenheit, ( in car windows, near radiators, ovens or in hot water).  Never make any adjustments to your splint, if the splint needs adjusting remove it and make an appointment to see your therapist. 3. Your splint, including the cushion liner may be cleaned with soap and lukewarm water.  Do not immerse in hot water over 135 degrees Farenheit. 4. Straps may be washed with soap and water, but do not moisten the self-adhesive portion. 5. For ink or hard to remove spots use a scouring cleanser which contains chlorine.  Rinse the splint  thoroughly after using chlorine cleanser.

## 2016-09-24 NOTE — Therapy (Signed)
[facility] [address] [place], [place], [address] Phone: [phone]   Fax:  [phone]  Occupational Therapy Treatment  Patient Details  Name: Brenda Cunningham MRN: [id number] Date of Birth: 23-Aug-1951 Referring Provider: Helane Rima  Encounter Date: 09/24/2016      OT End of Session - 09/24/16 1212    Visit Number 4   Number of Visits 16   Date for OT Re-Evaluation 11/02/16   Authorization Type medicare will need G code and PN every 10th visit   Authorization Time Period 60 days   Authorization - Visit Number 4   Authorization - Number of Visits 10   OT Start Time 1017   OT Stop Time 1101   OT Time Calculation (min) 44 min   Activity Tolerance Patient tolerated treatment well      Past Medical History:  Diagnosis Date  . A-fib ([place])   . Anxiety   . Arthritis   . Breast cancer ([facility]) 2008   s/p lumpectomy, chemo and radiation therapy  . Complication of anesthesia    DIFFICULTY WAKING UP FROM ANESTHESIA  . Hyperlipidemia   . Hypertension   . Stroke ([place]) 07/29/2015  . Wears glasses     Past Surgical History:  Procedure Laterality Date  . APPENDECTOMY    . BREAST LUMPECTOMY  2008   left  . COLONOSCOPY  2004  . DEBULKING N/A 03/24/2016   Procedure: RADICAL TUMOR DEBULKING;  Surgeon: Everitt Amber, MD;  [facility];  Service: Gynecology;  Laterality: N/A;  . OMENTECTOMY N/A 03/24/2016   Procedure: OMENTECTOMY;  Surgeon: Everitt Amber, MD;  [facility];  Service: Gynecology;  Laterality: N/A;  . ROBOTIC ASSISTED TOTAL HYSTERECTOMY WITH BILATERAL SALPINGO OOPHERECTOMY Bilateral 03/24/2016   Procedure: XI ROBOTIC ASSISTED TOTA LAPAROSCOPIC  HYSTERECTOMY WITH BILATERAL SALPINGO OOPHORECTOMY;  Surgeon: Everitt Amber, MD;  [facility];  Service: Gynecology;  Laterality: Bilateral;  . TUBAL LIGATION      There were no vitals filed for this visit.      Subjective Assessment - 09/24/16 1022     Subjective  I feel better today - I slept better last night.    Pertinent History see epic snapshot - PT WITH ACTIVE OVARIAN CANCER and cardiomegaly;  high risk for recurrence of stroke.    Patient Stated Goals Walk and my hand - I like being here.  I want to be able to walk in to my dt's house by November.   Currently in Pain? No/denies                      OT Treatments/Exercises (OP) - 09/24/16 0001      Neurological Re-education Exercises   Other Exercises 1 Neuro re ed to address grasp with R hand using wrist cock up splint.  Pt needs max cues and repetition for use.       Splinting   Splinting Completed fabrication of R cock up splint. Due to cognitive, perceptual and sensory deficits pt will need additional practice for use.               Balance Exercises - 09/23/16 1055      Balance Exercises: Standing   Standing Eyes Opened Wide (BOA)  Upper trunk rotations min A   Sit to Stand Time Min to mod A without UE support, cues for body mechanics           OT Education - 09/24/16 1210  Education provided Yes   Education Details splint wear and care   Person(s) Educated Patient   Methods Explanation;Demonstration;Tactile cues;Verbal cues   Comprehension Need further instruction          OT Short Term Goals - 09/24/16 1210      OT SHORT TERM GOAL #1   Title Pt and family will be mod I with HEP -    Status On-going     OT SHORT TERM GOAL #2   Title Pt will report no more than 3/10 pain in R shoulder with overhead reach   Status On-going     OT SHORT TERM GOAL #3   Title Pt will demonstrate 80* of shoulder flexion and abduction in RUE for overhead reach   Status On-going     OT SHORT TERM GOAL #4   Title Pt will require no more than supervision for static standing balance in prep for functional activity in standing   Status On-going     OT SHORT TERM GOAL #5   Title Pt will be mod I with hiking pants 100% of the time.   Status On-going            OT Long Term Goals - 09/24/16 1211      OT LONG TERM GOAL #1   Title Pt and family will be mod I with upgraded HEP - 11/03/2015   Status On-going     OT LONG TERM GOAL #2   Title Pt will require no more than min a for dynamic standing balance during functional activity   Status On-going     OT LONG TERM GOAL #3   Title Pt will report no than 2/10 pain with overhead reach   Status On-going     OT LONG TERM GOAL #4   Title Pt will demonstrate 85* of shoulder flexion for overhead reach   Status On-going     OT LONG TERM GOAL #5   Title Pt will demonstrate 80* of abduction for overhead reach   Status On-going     OT LONG TERM GOAL #6   Title Pt will be supevsion only for tub bench transfers   Status On-going     OT LONG TERM GOAL #7   Title Pt will use RUE as stabilzer with splint during basic ADL tasks prn   Status On-going     OT LONG TERM GOAL #8   Title Pt will tolerate at least 10 minutes of standing activity without requiring rest break.   Status On-going               Plan - 09/24/16 1211    Clinical Impression Statement Pt able to use wrist cock up splint to begin to work on grasp with R hand. Pt needs additional practice   Rehab Potential Fair   Clinical Impairments Affecting Rehab Potential given severity of deficits, medical status   OT Frequency 2x / week   OT Duration 8 weeks   OT Treatment/Interventions Self-care/ADL training;Electrical Stimulation;Moist Heat;DME and/or AE instruction;Neuromuscular education;Therapeutic exercise;Functional Mobility Training;Manual Therapy;Passive range of motion;Splinting;Therapeutic activities;Balance training;Patient/family education;Cognitive remediation/compensation;Visual/perceptual remediation/compensation   Plan practice use of cock up splint to address functional grasp for RUE as stabliizer, address pain in R shoulder (NO HEAT), functional mobility   Consulted and Agree with Plan of Care Patient       Patient will benefit from skilled therapeutic intervention in order to improve the following deficits and impairments:  Abnormal gait, Cardiopulmonary status limiting activity, Decreased activity  tolerance, Decreased balance, Decreased cognition, Decreased mobility, Decreased range of motion, Decreased safety awareness, Difficulty walking, Decreased strength, Impaired UE functional use, Impaired tone, Impaired sensation, Impaired vision/preception, Pain  Visit Diagnosis: Hemiplegia and hemiparesis following cerebral infarction affecting right non-dominant side (HCC)  Unsteadiness on feet  Muscle weakness (generalized)  Stiffness of right shoulder, not elsewhere classified  Visuospatial deficit  Frontal lobe and executive function deficit    Problem List Patient Active Problem List   Diagnosis Date Noted  . Genetic testing 08/17/2016  . Leukopenia due to antineoplastic chemotherapy ([place]) 06/05/2016  . Costochondritis, acute 05/02/2016  . Ovarian cancer ([place]) 03/24/2016  . Anemia due to antineoplastic chemotherapy 03/17/2016  . Port catheter in place 03/17/2016  . Portacath in place 02/05/2016  . Chemotherapy-induced peripheral neuropathy ([place]) 02/05/2016  . Hematoma 01/27/2016  . Chronic anticoagulation 01/22/2016  . Chronic atrial fibrillation ([place]) 01/22/2016  . PICC (peripherally inserted central catheter) in place 01/22/2016  . Chemotherapy induced neutropenia ([place]) 01/22/2016  . Poor venous access 01/22/2016  . CVA (cerebral vascular accident) ([place]) 12/27/2015  . Right hemiparesis ([place]) 12/27/2015  . Expressive aphasia 12/27/2015  . History of left breast cancer 12/27/2015  . H/O tubal ligation 12/27/2015  . Colon polyps 12/27/2015  . International Federation of Gynecology and Obstetrics (FIGO) stage IVB epithelial ovarian cancer ([place]) 12/27/2015  . Ovarian cancer, bilateral ([place]) 12/24/2015  . Inguinal adenopathy   . Carcinomatosis ([place]) 12/06/2015  . Essential  hypertension, benign 07/18/2014  . Atrial fibrillation, unspecified 07/18/2014  . Anxiety state, unspecified 07/18/2014    Quay Burow, OTR/L 09/24/2016, 12:13 PM  [facility] [address] [place], [place], [address] Phone: [phone]   Fax:  [phone]  Name: Brenda Cunningham MRN: [id number] Date of Birth: 1950/12/26

## 2016-09-25 ENCOUNTER — Ambulatory Visit: Payer: Medicare Other

## 2016-09-25 DIAGNOSIS — M25511 Pain in right shoulder: Secondary | ICD-10-CM | POA: Diagnosis not present

## 2016-09-25 DIAGNOSIS — M25611 Stiffness of right shoulder, not elsewhere classified: Secondary | ICD-10-CM | POA: Diagnosis not present

## 2016-09-25 DIAGNOSIS — R4701 Aphasia: Secondary | ICD-10-CM

## 2016-09-25 DIAGNOSIS — I69353 Hemiplegia and hemiparesis following cerebral infarction affecting right non-dominant side: Secondary | ICD-10-CM | POA: Diagnosis not present

## 2016-09-25 DIAGNOSIS — R41842 Visuospatial deficit: Secondary | ICD-10-CM | POA: Diagnosis not present

## 2016-09-25 DIAGNOSIS — R2681 Unsteadiness on feet: Secondary | ICD-10-CM | POA: Diagnosis not present

## 2016-09-25 DIAGNOSIS — G8929 Other chronic pain: Secondary | ICD-10-CM | POA: Diagnosis not present

## 2016-09-25 NOTE — Therapy (Signed)
[facility] [address] [place], [place], [address] Phone: [phone]   Fax:  [phone]  Speech Language Pathology Treatment  Patient Details  Name: Brenda Cunningham MRN: [id number] Date of Birth: [birth year] Referring Provider: Darra Lis, M.D.  Encounter Date: 09/25/2016      End of Session - 09/25/16 [id number]    Visit Number 2   Number of Visits 17   Date for SLP Re-Evaluation 12/04/16   SLP Start Time 0802   SLP Stop Time  0845   SLP Time Calculation (min) 43 min   Activity Tolerance Patient limited by lethargy      Past Medical History:  Diagnosis Date  . A-fib ([place])   . Anxiety   . Arthritis   . Breast cancer ([place]) 2008   s/p lumpectomy, chemo and radiation therapy  . Complication of anesthesia    DIFFICULTY WAKING UP FROM ANESTHESIA  . Hyperlipidemia   . Hypertension   . Stroke ([place]) 07/29/2015  . Wears glasses     Past Surgical History:  Procedure Laterality Date  . APPENDECTOMY    . BREAST LUMPECTOMY  2008   left  . COLONOSCOPY  2004  . DEBULKING N/A 03/24/2016   Procedure: RADICAL TUMOR DEBULKING;  Surgeon: Everitt Amber, MD;  Location: [facility];  Service: Gynecology;  Laterality: N/A;  . OMENTECTOMY N/A 03/24/2016   Procedure: OMENTECTOMY;  Surgeon: Everitt Amber, MD;  [facility];  Service: Gynecology;  Laterality: N/A;  . ROBOTIC ASSISTED TOTAL HYSTERECTOMY WITH BILATERAL SALPINGO OOPHERECTOMY Bilateral 03/24/2016   Procedure: XI ROBOTIC ASSISTED TOTA LAPAROSCOPIC  HYSTERECTOMY WITH BILATERAL SALPINGO OOPHORECTOMY;  Surgeon: Everitt Amber, MD;  [facility];  Service: Gynecology;  Laterality: Bilateral;  . TUBAL LIGATION      There were no vitals filed for this visit.      Subjective Assessment - 09/25/16 0810    Subjective "How you doin?"   Currently in Pain? No/denies               ADULT SLP TREATMENT - 09/25/16 0811      General Information   Behavior/Cognition  Alert;Cooperative;Pleasant mood     Treatment Provided   Treatment provided Cognitive-Linquistic     Cognitive-Linquistic Treatment   Treatment focused on Aphasia   Skilled Treatment Pt told SLP that she had high sodium meal (Oodles of Noodles) Wednesday night, may have incr'd her BP. SLP provided handout re: low sodium diet. Pt read the first few paragraphs with slow rate as an introduction to reducing sodium in her diet, as well as practice compensation of slowed rate. Pt noted to make multiple linguistic errors (semantic and phonemic) on reading task, but comprehension was fairly good, as pt was able to answer questions. In sentence tasks, pt req'd occasional min verbal and demo cues from SLP to reduce rate of speech in order to incr accuracy of verbal expression.      Assessment / Recommendations / Plan   Plan Continue with current plan of care     Progression Toward Goals   Progression toward goals Progressing toward goals          SLP Education - 09/25/16 [phone]    Education provided Yes   Education Details reduced rate of speech for compensation for aphasia   Person(s) Educated Patient   Methods Explanation;Demonstration   Comprehension Verbalized understanding;Returned demonstration;Need further instruction;Verbal cues required          SLP Short Term Goals - 09/25/16 [id number]  SLP SHORT TERM GOAL #1   Title pt will generate appropriate language to participate in functional 10 minutes mod complex converastion with occasional  min A   Time 4   Period Weeks   Status On-going     SLP SHORT TERM GOAL #2   Title pt will generate sentence level responses in structured tasks with 90% success over three sessions    Time 4   Period Weeks   Status On-going     SLP SHORT TERM GOAL #3   Title pt will demo understanding of 10 minutes mod-complex conversation with rare min A   Time 4   Period Weeks   Status On-going     SLP SHORT TERM GOAL #4   Title pt will attempt  multimodal communication PRN in mod complex conversation   Time 4   Period Weeks   Status On-going          SLP Long Term Goals - 09/25/16 [id number]      SLP LONG TERM GOAL #1   Title pt will engage in mod complex-complex conversation functionally with modified independence (multimodal communication, compensations, extra time) over 3 sessions   Time 8   Period Weeks   Status On-going     SLP LONG TERM GOAL #2   Title pt will demo understanding of mod complex-complex conversation with modified independence   Time 8   Period Weeks   Status On-going          Plan - 09/25/16 [id number]    Clinical Impression Statement Pt wiht persistent receptive and expressive aphasia, expressive>receptive, and reportedly more pronounced language deficits since undergoing chemotherapy. She would benefit from skilled ST for improvement of reeptive and expressive language skills.    Speech Therapy Frequency 2x / week   Duration --  8 weeks   Treatment/Interventions Language facilitation;Internal/external aids;SLP instruction and feedback;Multimodal communcation approach;Functional tasks;Patient/family education;Compensatory strategies   Potential to Achieve Goals Fair   Potential Considerations Severity of impairments   Consulted and Agree with Plan of Care Patient      Patient will benefit from skilled therapeutic intervention in order to improve the following deficits and impairments:   Aphasia    Problem List Patient Active Problem List   Diagnosis Date Noted  . Genetic testing 08/17/2016  . Leukopenia due to antineoplastic chemotherapy ([place]) 06/05/2016  . Costochondritis, acute 05/02/2016  . Ovarian cancer ([place]) 03/24/2016  . Anemia due to antineoplastic chemotherapy 03/17/2016  . Port catheter in place 03/17/2016  . Portacath in place 02/05/2016  . Chemotherapy-induced peripheral neuropathy ([place]) 02/05/2016  . Hematoma 01/27/2016  . Chronic anticoagulation 01/22/2016  . Chronic atrial  fibrillation ([place]) 01/22/2016  . PICC (peripherally inserted central catheter) in place 01/22/2016  . Chemotherapy induced neutropenia ([place]) 01/22/2016  . Poor venous access 01/22/2016  . CVA (cerebral vascular accident) ([place]) 12/27/2015  . Right hemiparesis ([place]) 12/27/2015  . Expressive aphasia 12/27/2015  . History of left breast cancer 12/27/2015  . H/O tubal ligation 12/27/2015  . Colon polyps 12/27/2015  . International Federation of Gynecology and Obstetrics (FIGO) stage IVB epithelial ovarian cancer ([place]) 12/27/2015  . Ovarian cancer, bilateral ([place]) 12/24/2015  . Inguinal adenopathy   . Carcinomatosis ([place]) 12/06/2015  . Essential hypertension, benign 07/18/2014  . Atrial fibrillation, unspecified 07/18/2014  . Anxiety state, unspecified 07/18/2014    [facility] ,[place], [place]  09/25/2016, 8:53 AM  [facility] [address] [place], [place], [address] Phone: [phone]   Fax:  [phone]   Name: Erlean Harkness MRN: [id number] Date of Birth: 07/05/1951

## 2016-09-25 NOTE — Patient Instructions (Signed)
  Please complete the assigned speech therapy homework and return it to your next session.  

## 2016-09-25 NOTE — Therapy (Signed)
[facility] [address] [place], [place], [address] Phone: [phone]   Fax:  [phone]  Speech Language Pathology Evaluation  Patient Details  Name: Brenda Cunningham MRN: [id number] Date of Birth: [birth year] Referring Provider: Darra Lis, M.D.  Encounter Date: 09/23/2016    Past Medical History:  Diagnosis Date  . A-fib ([place])   . Anxiety   . Arthritis   . Breast cancer ([place]) 2008   s/p lumpectomy, chemo and radiation therapy  . Complication of anesthesia    DIFFICULTY WAKING UP FROM ANESTHESIA  . Hyperlipidemia   . Hypertension   . Stroke ([place]) 07/29/2015  . Wears glasses     Past Surgical History:  Procedure Laterality Date  . APPENDECTOMY    . BREAST LUMPECTOMY  2008   left  . COLONOSCOPY  2004  . DEBULKING N/A 03/24/2016   Procedure: RADICAL TUMOR DEBULKING;  Surgeon: Everitt Amber, MD;  [facility];  Service: Gynecology;  Laterality: N/A;  . OMENTECTOMY N/A 03/24/2016   Procedure: OMENTECTOMY;  Surgeon: Everitt Amber, MD;  [facility];  Service: Gynecology;  Laterality: N/A;  . ROBOTIC ASSISTED TOTAL HYSTERECTOMY WITH BILATERAL SALPINGO OOPHERECTOMY Bilateral 03/24/2016   Procedure: XI ROBOTIC ASSISTED TOTA LAPAROSCOPIC  HYSTERECTOMY WITH BILATERAL SALPINGO OOPHORECTOMY;  Surgeon: Everitt Amber, MD;  [facility];  Service: Gynecology;  Laterality: Bilateral;  . TUBAL LIGATION      There were no vitals filed for this visit.                             SLP Short Term Goals - 09/25/16 0759      SLP SHORT TERM GOAL #1   Title pt will generate appropriate language to participate in functional 10 minutes mod complex converastion with occasional  min A   Time 4   Period Weeks   Status New     SLP SHORT TERM GOAL #2   Title pt will generate sentence level responses in structured tasks with 90% success over three sessions    Time 4   Period Weeks   Status New     SLP SHORT TERM GOAL #3   Title pt will demo understanding of 10 minutes mod-complex conversation with rare min A   Time 4   Period Weeks   Status New     SLP SHORT TERM GOAL #4   Title pt will attempt multimodal communication PRN in mod complex conversation   Time 4   Period Weeks   Status New          SLP Long Term Goals - 09/25/16 0802      SLP LONG TERM GOAL #1   Title pt will engage in mod complex-complex conversation functionally with modified independence (multimodal communication, compensations, extra time) over 3 sessions   Time 8   Period Weeks   Status New     SLP LONG TERM GOAL #2   Title pt will demo understanding of mod complex-complex conversation with modified independence   Time 8   Period Weeks   Status New        Patient will benefit from skilled therapeutic intervention in order to improve the following deficits and impairments:   Aphasia    Problem List Patient Active Problem List   Diagnosis Date Noted  . Genetic testing 08/17/2016  . Leukopenia due to antineoplastic chemotherapy ([place]) 06/05/2016  . Costochondritis, acute 05/02/2016  . Ovarian cancer (  [place]) 03/24/2016  . Anemia due to antineoplastic chemotherapy 03/17/2016  . Port catheter in place 03/17/2016  . Portacath in place 02/05/2016  . Chemotherapy-induced peripheral neuropathy ([place]) 02/05/2016  . Hematoma 01/27/2016  . Chronic anticoagulation 01/22/2016  . Chronic atrial fibrillation ([place]) 01/22/2016  . PICC (peripherally inserted central catheter) in place 01/22/2016  . Chemotherapy induced neutropenia ([place]) 01/22/2016  . Poor venous access 01/22/2016  . CVA (cerebral vascular accident) ([place]) 12/27/2015  . Right hemiparesis ([place]) 12/27/2015  . Expressive aphasia 12/27/2015  . History of left breast cancer 12/27/2015  . H/O tubal ligation 12/27/2015  . Colon polyps 12/27/2015  . International Federation of Gynecology and Obstetrics (FIGO) stage IVB epithelial ovarian cancer  ([place]) 12/27/2015  . Ovarian cancer, bilateral ([place]) 12/24/2015  . Inguinal adenopathy   . Carcinomatosis ([place]) 12/06/2015  . Essential hypertension, benign 07/18/2014  . Atrial fibrillation, unspecified 07/18/2014  . Anxiety state, unspecified 07/18/2014    [facility] ,[place], CCC-SLP  09/25/2016, 8:05 AM  [facility] [address] [place], [place], [address] Phone: [phone]   Fax:  [phone]  Name: Brenda Cunningham MRN: [id number] Date of Birth: [birth year]

## 2016-09-28 ENCOUNTER — Encounter: Payer: Self-pay | Admitting: Rehabilitation

## 2016-09-28 ENCOUNTER — Ambulatory Visit: Payer: Medicare Other | Admitting: Rehabilitation

## 2016-09-28 ENCOUNTER — Ambulatory Visit: Payer: Medicare Other | Admitting: Occupational Therapy

## 2016-09-28 ENCOUNTER — Encounter: Payer: Self-pay | Admitting: Occupational Therapy

## 2016-09-28 ENCOUNTER — Ambulatory Visit: Payer: Medicare Other | Admitting: *Deleted

## 2016-09-28 DIAGNOSIS — M25611 Stiffness of right shoulder, not elsewhere classified: Secondary | ICD-10-CM | POA: Diagnosis not present

## 2016-09-28 DIAGNOSIS — R2681 Unsteadiness on feet: Secondary | ICD-10-CM

## 2016-09-28 DIAGNOSIS — M25511 Pain in right shoulder: Secondary | ICD-10-CM | POA: Diagnosis not present

## 2016-09-28 DIAGNOSIS — G8929 Other chronic pain: Secondary | ICD-10-CM | POA: Diagnosis not present

## 2016-09-28 DIAGNOSIS — R41844 Frontal lobe and executive function deficit: Secondary | ICD-10-CM

## 2016-09-28 DIAGNOSIS — R4701 Aphasia: Secondary | ICD-10-CM

## 2016-09-28 DIAGNOSIS — I69353 Hemiplegia and hemiparesis following cerebral infarction affecting right non-dominant side: Secondary | ICD-10-CM

## 2016-09-28 DIAGNOSIS — R41842 Visuospatial deficit: Secondary | ICD-10-CM

## 2016-09-28 DIAGNOSIS — M6281 Muscle weakness (generalized): Secondary | ICD-10-CM

## 2016-09-28 NOTE — Therapy (Signed)
[facility] [address] [place], [place], [address] Phone: [phone]   Fax:  [phone]  Speech Language Pathology Treatment  Patient Details  Name: Brenda Cunningham MRN: [id number] Date of Birth: 11/01/1951 Referring Provider: Darra Lis, M.D.  Encounter Date: 09/28/2016      End of Session - 09/28/16 1133    Visit Number 3   Number of Visits 17   Date for SLP Re-Evaluation 12/04/16   SLP Start Time 1103   SLP Stop Time  1145   SLP Time Calculation (min) 42 min   Activity Tolerance Patient tolerated treatment well      Past Medical History:  Diagnosis Date  . A-fib ([place])   . Anxiety   . Arthritis   . Breast cancer ([place]) 2008   s/p lumpectomy, chemo and radiation therapy  . Complication of anesthesia    DIFFICULTY WAKING UP FROM ANESTHESIA  . Hyperlipidemia   . Hypertension   . Stroke ([place]) 07/29/2015  . Wears glasses     Past Surgical History:  Procedure Laterality Date  . APPENDECTOMY    . BREAST LUMPECTOMY  2008   left  . COLONOSCOPY  2004  . DEBULKING N/A 03/24/2016   Procedure: RADICAL TUMOR DEBULKING;  Surgeon: Everitt Amber, MD;  [facility];  Service: Gynecology;  Laterality: N/A;  . OMENTECTOMY N/A 03/24/2016   Procedure: OMENTECTOMY;  Surgeon: Everitt Amber, MD;  [facility];  Service: Gynecology;  Laterality: N/A;  . ROBOTIC ASSISTED TOTAL HYSTERECTOMY WITH BILATERAL SALPINGO OOPHERECTOMY Bilateral 03/24/2016   Procedure: XI ROBOTIC ASSISTED TOTA LAPAROSCOPIC  HYSTERECTOMY WITH BILATERAL SALPINGO OOPHORECTOMY;  Surgeon: Everitt Amber, MD;  [facility];  Service: Gynecology;  Laterality: Bilateral;  . TUBAL LIGATION      There were no vitals filed for this visit.      Subjective Assessment - 09/28/16 1114    Subjective "Doing good."   Special Tests CT scan scheduled for Wednesday               ADULT SLP TREATMENT - 09/28/16 1119      General Information    Behavior/Cognition Alert;Cooperative;Pleasant mood     Treatment Provided   Treatment provided Cognitive-Linquistic     Cognitive-Linquistic Treatment   Treatment focused on Aphasia   Skilled Treatment Pt required mod A to recall discussion from previous session.  Pt able to generate sentences when given a target word with 80% acc and mod I for increased time. Functional descriptive language requires min- mod A for adequate detail.      Assessment / Recommendations / Plan   Plan Continue with current plan of care     Progression Toward Goals   Progression toward goals Progressing toward goals            SLP Short Term Goals - 09/28/16 1143      SLP SHORT TERM GOAL #1   Title pt will generate appropriate language to participate in functional 10 minutes mod complex converastion with occasional  min A   Status On-going     SLP SHORT TERM GOAL #2   Title pt will generate sentence level responses in structured tasks with 90% success over three sessions    Status On-going     SLP SHORT TERM GOAL #3   Title pt will demo understanding of 10 minutes mod-complex conversation with rare min A   Baseline 10/23,    Status On-going     SLP SHORT TERM GOAL #  4   Title pt will attempt multimodal communication PRN in mod complex conversation   Status On-going     SLP SHORT TERM GOAL #5   Title pt will attempt to use multimodal communicaiton when verbal communication unsuccessful/difficult    Baseline 10/23 gesturing   Status On-going          SLP Long Term Goals - 09/25/16 0853      SLP LONG TERM GOAL #1   Title pt will engage in mod complex-complex conversation functionally with modified independence (multimodal communication, compensations, extra time) over 3 sessions   Time 8   Period Weeks   Status On-going     SLP LONG TERM GOAL #2   Title pt will demo understanding of mod complex-complex conversation with modified independence   Time 8   Period Weeks   Status On-going           Plan - 09/28/16 1136    Clinical Impression Statement Pt with persistent aphasia and dysfluency. Pt able to use compensatory strategies of slower rate and descriptive language with min- mod A.    Speech Therapy Frequency 2x / week   Duration --  8 weeks   Treatment/Interventions Language facilitation;Internal/external aids;SLP instruction and feedback;Multimodal communcation approach;Functional tasks;Patient/family education;Compensatory strategies   Potential to Achieve Goals Good   Potential Considerations Severity of impairments   Consulted and Agree with Plan of Care Patient      Patient will benefit from skilled therapeutic intervention in order to improve the following deficits and impairments:   Aphasia    Problem List Patient Active Problem List   Diagnosis Date Noted  . Genetic testing 08/17/2016  . Leukopenia due to antineoplastic chemotherapy ([place]) 06/05/2016  . Costochondritis, acute 05/02/2016  . Ovarian cancer ([place]) 03/24/2016  . Anemia due to antineoplastic chemotherapy 03/17/2016  . Port catheter in place 03/17/2016  . Portacath in place 02/05/2016  . Chemotherapy-induced peripheral neuropathy ([place]) 02/05/2016  . Hematoma 01/27/2016  . Chronic anticoagulation 01/22/2016  . Chronic atrial fibrillation ([place]) 01/22/2016  . PICC (peripherally inserted central catheter) in place 01/22/2016  . Chemotherapy induced neutropenia ([place]) 01/22/2016  . Poor venous access 01/22/2016  . CVA (cerebral vascular accident) ([place]) 12/27/2015  . Right hemiparesis ([place]) 12/27/2015  . Expressive aphasia 12/27/2015  . History of left breast cancer 12/27/2015  . H/O tubal ligation 12/27/2015  . Colon polyps 12/27/2015  . International Federation of Gynecology and Obstetrics (FIGO) stage IVB epithelial ovarian cancer ([place]) 12/27/2015  . Ovarian cancer, bilateral ([place]) 12/24/2015  . Inguinal adenopathy   . Carcinomatosis ([place]) 12/06/2015  . Essential hypertension,  benign 07/18/2014  . Atrial fibrillation, unspecified 07/18/2014  . Anxiety state, unspecified 07/18/2014    Vinetta Bergamo MA, CCC-SLP 09/28/2016, 3:21 PM  [facility] [address] [place], [place], [address] Phone: [phone]   Fax:  [phone]   Name: Winnell Calderaro MRN: [id number] Date of Birth: [birth year]

## 2016-09-28 NOTE — Patient Instructions (Signed)
Have someone standing with you when you do these exercises!!   ABDUCTION: Standing (Active)    Stand facing the counter top with both hands on counter for support.  Stand, feet flat. Lift right leg out to side.  Complete __1_ sets of _10__ repetitions. Perform _1-2__ sessions per day.  http://gtsc.exer.us/111   Copyright  VHI. All rights reserved.   EXTENSION: Standing (Active)    Stand facing counter top for support with your hands.  Stand, both feet flat. Draw right leg behind body as far as possible.  Complete _1__ sets of _10__ repetitions. Perform _1-2__ sessions per day.  http://gtsc.exer.us/77   Copyright  VHI. All rights reserved.   "I love a Parade" Lift    Using a chair if necessary, march in place as high and slow as you can.   Repeat _20___ times. Do __1-2__ sessions per day.  http://gt2.exer.us/345   Copyright  VHI. All rights reserved.

## 2016-09-28 NOTE — Therapy (Signed)
[facility] [address] [place], [place], [address] Phone: [phone]   Fax:  [phone]  Occupational Therapy Treatment  Patient Details  Name: Brenda Cunningham MRN: [id number] Date of Birth: 12/30/1950 Referring Provider: Helane Rima  Encounter Date: 09/28/2016      OT End of Session - 09/28/16 1243    Visit Number 5   Number of Visits 16   Date for OT Re-Evaluation 11/02/16   Authorization Type medicare will need G code and PN every 10th visit   Authorization Time Period 60 days   Authorization - Visit Number 5   Authorization - Number of Visits 10   OT Start Time [id number]   OT Stop Time 1227   OT Time Calculation (min) 39 min      Past Medical History:  Diagnosis Date  . A-fib ([place])   . Anxiety   . Arthritis   . Breast cancer ([place]) 2008   s/p lumpectomy, chemo and radiation therapy  . Complication of anesthesia    DIFFICULTY WAKING UP FROM ANESTHESIA  . Hyperlipidemia   . Hypertension   . Stroke ([place]) 07/29/2015  . Wears glasses     Past Surgical History:  Procedure Laterality Date  . APPENDECTOMY    . BREAST LUMPECTOMY  2008   left  . COLONOSCOPY  2004  . DEBULKING N/A 03/24/2016   Procedure: RADICAL TUMOR DEBULKING;  Surgeon: Everitt Amber, MD;  Location: [facility];  Service: Gynecology;  Laterality: N/A;  . OMENTECTOMY N/A 03/24/2016   Procedure: OMENTECTOMY;  Surgeon: Everitt Amber, MD;  Location: [facility];  Service: Gynecology;  Laterality: N/A;  . ROBOTIC ASSISTED TOTAL HYSTERECTOMY WITH BILATERAL SALPINGO OOPHERECTOMY Bilateral 03/24/2016   Procedure: XI ROBOTIC ASSISTED TOTA LAPAROSCOPIC  HYSTERECTOMY WITH BILATERAL SALPINGO OOPHORECTOMY;  Surgeon: Everitt Amber, MD;  Location: [facility];  Service: Gynecology;  Laterality: Bilateral;  . TUBAL LIGATION      There were no vitals filed for this visit.      Subjective Assessment - 09/28/16 1155    Subjective  The pain in my belly comes and goes - it is  gone right now   Pertinent History see epic snapshot - PT WITH ACTIVE OVARIAN CANCER and cardiomegaly;  high risk for recurrence of stroke.    Patient Stated Goals Walk and my hand - I like being here.  I want to be able to walk in to my dt's house by November.   Currently in Pain? No/denies                              OT Education - 09/28/16 1239    Education provided Yes   Education Details splint wear and care, use of splint in HEP and function   Person(s) Educated Patient;Child(ren)   Methods Explanation;Demonstration;Verbal cues;Handout   Comprehension Verbalized understanding;Returned demonstration  son to assist pt prn          OT Short Term Goals - 09/28/16 1239      OT SHORT TERM GOAL #1   Title Pt and family will be mod I with HEP - 10/06/2016   Status On-going     OT SHORT TERM GOAL #2   Title Pt will report no more than 3/10 pain in R shoulder with overhead reach   Status On-going     OT SHORT TERM GOAL #3   Title Pt will demonstrate 80* of shoulder flexion and abduction in  RUE for overhead reach   Status Achieved     OT SHORT TERM GOAL #4   Title Pt will require no more than supervision for static standing balance in prep for functional activity in standing   Status Achieved     OT SHORT TERM GOAL #5   Title Pt will be mod I with hiking pants 100% of the time.   Status On-going           OT Long Term Goals - 09/28/16 1241      OT LONG TERM GOAL #1   Title Pt and family will be mod I with upgraded HEP - 11/03/2015   Status On-going     OT LONG TERM GOAL #2   Title Pt will require no more than min a for dynamic standing balance during functional activity   Status On-going     OT LONG TERM GOAL #3   Title Pt will report no than 2/10 pain with overhead reach   Status On-going     OT LONG TERM GOAL #4   Title Pt will demonstrate 85* of shoulder flexion for overhead reach   Status On-going     OT LONG TERM GOAL #5   Title  Pt will demonstrate 80* of abduction for overhead reach   Status On-going     OT LONG TERM GOAL #6   Title Pt will be supevsion only for tub bench transfers   Status On-going     OT LONG TERM GOAL #7   Title Pt will use RUE as stabilzer with splint during basic ADL tasks prn   Status On-going     OT LONG TERM GOAL #8   Title Pt will tolerate at least 10 minutes of standing activity without requiring rest break.   Status On-going               Plan - 09/28/16 1241    Clinical Impression Statement Pt progressing toward goals. Pt with significant motor planning and sensory deficits and will need practice for use of cock up splint - son to assist prn   Rehab Potential Fair   Clinical Impairments Affecting Rehab Potential given severity of deficits, medical status   OT Frequency 2x / week   OT Duration 8 weeks   OT Treatment/Interventions Self-care/ADL training;Electrical Stimulation;Moist Heat;DME and/or AE instruction;Neuromuscular education;Therapeutic exercise;Functional Mobility Training;Manual Therapy;Passive range of motion;Splinting;Therapeutic activities;Balance training;Patient/family education;Cognitive remediation/compensation;Visual/perceptual remediation/compensation   Plan check HEP with splint and splint fit, Address shoulder pain and update HEP for proximal strength and overhead reach   Consulted and Agree with Plan of Care Patient      Patient will benefit from skilled therapeutic intervention in order to improve the following deficits and impairments:  Abnormal gait, Cardiopulmonary status limiting activity, Decreased activity tolerance, Decreased balance, Decreased cognition, Decreased mobility, Decreased range of motion, Decreased safety awareness, Difficulty walking, Decreased strength, Impaired UE functional use, Impaired tone, Impaired sensation, Impaired vision/preception, Pain  Visit Diagnosis: Hemiplegia and hemiparesis following cerebral infarction  affecting right non-dominant side (HCC)  Unsteadiness on feet  Muscle weakness (generalized)  Stiffness of right shoulder, not elsewhere classified  Visuospatial deficit  Frontal lobe and executive function deficit    Problem List Patient Active Problem List   Diagnosis Date Noted  . Genetic testing 08/17/2016  . Leukopenia due to antineoplastic chemotherapy ([place]) 06/05/2016  . Costochondritis, acute 05/02/2016  . Ovarian cancer ([place]) 03/24/2016  . Anemia due to antineoplastic chemotherapy 03/17/2016  . Port catheter in place  03/17/2016  . Portacath in place 02/05/2016  . Chemotherapy-induced peripheral neuropathy ([place]) 02/05/2016  . Hematoma 01/27/2016  . Chronic anticoagulation 01/22/2016  . Chronic atrial fibrillation ([place]) 01/22/2016  . PICC (peripherally inserted central catheter) in place 01/22/2016  . Chemotherapy induced neutropenia ([place]) 01/22/2016  . Poor venous access 01/22/2016  . CVA (cerebral vascular accident) ([place]) 12/27/2015  . Right hemiparesis ([place]) 12/27/2015  . Expressive aphasia 12/27/2015  . History of left breast cancer 12/27/2015  . H/O tubal ligation 12/27/2015  . Colon polyps 12/27/2015  . International Federation of Gynecology and Obstetrics (FIGO) stage IVB epithelial ovarian cancer ([place]) 12/27/2015  . Ovarian cancer, bilateral ([place]) 12/24/2015  . Inguinal adenopathy   . Carcinomatosis ([place]) 12/06/2015  . Essential hypertension, benign 07/18/2014  . Atrial fibrillation, unspecified 07/18/2014  . Anxiety state, unspecified 07/18/2014    Quay Burow, OTR/L 09/28/2016, 12:44 PM  [facility] [address] [place], [place], [address] Phone: [phone]   Fax:  [phone]  Name: Brenda Cunningham MRN: [id number] Date of Birth: 1951-04-22

## 2016-09-28 NOTE — Patient Instructions (Signed)
Your Splint This splint should initially be fitted by a healthcare practitioner.  The healthcare practitioner is responsible for providing wearing instructions and precautions to the patient, other healthcare practitioners and care provider involved in the patient's care.  This splint was custom made for you. Please read the following instructions to learn about wearing and caring for your splint.  Precautions Should your splint cause any of the following problems, remove the splint immediately and contact your therapist/physician.  Swelling  Severe Pain  Pressure Areas  Stiffness  Numbness  Do not wear your splint while operating machinery unless it has been fabricated for that purpose.  When To Wear Your Splint Where your splint according to your therapist/physician instructions.  You will build up to wearing the splint listed below. START by wearing the splint for 20 minutes 3 times per day when you are practicing holding items, squeezing items and moving items.  Then you can wear as listed below.  You will need to take it off when walk with the walker.    Start by doing the following: Day 1:   20 minutes while practicing holding objects.  3 times per day (after each meal) Day 2:   Same as Day 1 Day 3:   30 minutes while practicing holding objects.  3 times per day (after each meal) Day 4:    Same as Day 3 Day 5:   One hour while practicing holding objects.  3 times per day (after each meal) Day 6:   Can start 2 hours on 2 hours off.  You will build up to wearing this only during the daytime for 2 hours on 2 hours off.  Make sure you check your skin for the things listed above when you take off the splint. DO NOT WEAR THE SPLINT AT NIGHT.    Care and Cleaning of Your Splint 1. Keep your splint away from open flames. 2. Your splint will lose its shape in temperatures over 135 degrees Farenheit, ( in car windows, near radiators, ovens or in hot water).  Never make any  adjustments to your splint, if the splint needs adjusting remove it and make an appointment to see your therapist. 3. Your splint, including the cushion liner may be cleaned with soap and lukewarm water.  Do not immerse in hot water over 135 degrees Farenheit. 4. Straps may be washed with soap and water, but do not moisten the self-adhesive portion. 5. For ink or hard to remove spots use a scouring cleanser which contains chlorine.  Rinse the splint thoroughly after using chlorine cleanser.

## 2016-09-28 NOTE — Therapy (Signed)
[facility] [address] [place], [place], [address] Phone: [phone]   Fax:  [phone]  Physical Therapy Treatment  Patient Details  Name: Brenda Cunningham MRN: [id number] Date of Birth: May 14, 1951 Referring Provider: Helane Rima, MD  Encounter Date: 09/28/2016      PT End of Session - 09/28/16 1258    Visit Number 6   Number of Visits 17   Date for PT Re-Evaluation 11/06/16   Authorization Type Medicare Part A and B   Authorization Time Period G-Code every 10th visit   PT Start Time 1017   PT Stop Time 1100   PT Time Calculation (min) 43 min   Equipment Utilized During Treatment Gait belt   Activity Tolerance Patient tolerated treatment well   Behavior During Therapy WFL for tasks assessed/performed      Past Medical History:  Diagnosis Date  . A-fib ([place])   . Anxiety   . Arthritis   . Breast cancer ([place]) 2008   s/p lumpectomy, chemo and radiation therapy  . Complication of anesthesia    DIFFICULTY WAKING UP FROM ANESTHESIA  . Hyperlipidemia   . Hypertension   . Stroke ([place]) 07/29/2015  . Wears glasses     Past Surgical History:  Procedure Laterality Date  . APPENDECTOMY    . BREAST LUMPECTOMY  2008   left  . COLONOSCOPY  2004  . DEBULKING N/A 03/24/2016   Procedure: RADICAL TUMOR DEBULKING;  Surgeon: Everitt Amber, MD;  [facility];  Service: Gynecology;  Laterality: N/A;  . OMENTECTOMY N/A 03/24/2016   Procedure: OMENTECTOMY;  Surgeon: Everitt Amber, MD;  [facility];  Service: Gynecology;  Laterality: N/A;  . ROBOTIC ASSISTED TOTAL HYSTERECTOMY WITH BILATERAL SALPINGO OOPHERECTOMY Bilateral 03/24/2016   Procedure: XI ROBOTIC ASSISTED TOTA LAPAROSCOPIC  HYSTERECTOMY WITH BILATERAL SALPINGO OOPHORECTOMY;  Surgeon: Everitt Amber, MD;  Location[facility];  Service: Gynecology;  Laterality: Bilateral;  . TUBAL LIGATION      There were no vitals filed for this visit.      Subjective Assessment  - 09/28/16 1025    Subjective Reports feeling better today.  BP 148/86 beginning of session (following gait into clinic).     Limitations Standing;Walking;House hold activities   Patient Stated Goals "I want to get up and see my daughter and get on the train to see my sons in [place]."   Currently in Pain? Yes   Pain Score 1    Pain Location Abdomen   Pain Orientation Left   Pain Descriptors / Indicators Dull   Pain Type Acute pain   Pain Onset Today   Pain Frequency Occasional   Aggravating Factors  noticed it when drinking coffee this morning                         [facility] Adult PT Treatment/Exercise - 09/28/16 1044      Ambulation/Gait   Ambulation/Gait Yes   Ambulation/Gait Assistance 5: Supervision   Ambulation/Gait Assistance Details Occasional cues on posture, however pt doing much better at catching herself and correcting posture within session.     Ambulation Distance (Feet) 115 Feet  x 2   Assistive device Rolling walker   Gait Pattern Step-through pattern;Decreased step length - left;Decreased stance time - right;Decreased stride length;Decreased hip/knee flexion - right;Trunk flexed;Narrow base of support;Poor foot clearance - right   Ambulation Surface Level;Indoor   Ramp 5: Supervision;4: Min assist   Ramp Details (indicate cue  type and reason) Performed ramp x 2 reps during session with RW   Curb 5: Supervision  min/guard   Curb Details (indicate cue type and reason) min/guard for safety with min cues for RW placement, however did well recalling sequence of LE placement.   x 2 reps     Neuro Re-ed    Neuro Re-ed Details  Performed standing exercises for NMR in RLE and improved postural control: standing hip abd x 10 reps, standing hip extension x 10 reps, standing marching x 10 reps, sit<>stand x 10 reps with hands on lap with focus on equal WB, improved forward weight shift and controlled descent.         NMR:  Seated nustep with LEs only x 5  mins at level 4 resistance with cues for maintaining RLE alignment during task.  Tolerated well.           PT Education - 09/28/16 1027    Education provided Yes   Education Details additions of standing HEP with S from family, see pt instruction   Person(s) Educated Patient   Methods Explanation;Demonstration;Handout   Comprehension Verbalized understanding;Returned demonstration          PT Short Term Goals - 09/07/16 1347      PT SHORT TERM GOAL #1   Title Pt will be independent with assistance from caregiver and verbalize understand of initial HEP to further progress toward her goals. (Target Date for all STGs: 10/05/16)   Time 4   Period Weeks   Status New     PT SHORT TERM GOAL #2   Title Pt will improve Berg Balance score to > or = 26/56 to indicate an improvement in static balance.   Time 4   Period Weeks   Status New     PT SHORT TERM GOAL #3   Title Pt will improve TUG time to < or = 32.5 sec indicating an improvemet in functional mobility and balance.   Time 4   Period Weeks   Status New     PT SHORT TERM GOAL #4   Title Pt will improve initial gait speed by 0.6 ft/sec to indicate improvement in functional mobility and functional status.   Time 4   Period Weeks   Status New     PT SHORT TERM GOAL #5   Title Pt will ambulate 239ft with LRAD and supervision indoors over level surfaces including carpet and hard flors to incr ability to safely ambulate at home.   Time 4   Period Weeks   Status New     Additional Short Term Goals   Additional Short Term Goals Yes     PT SHORT TERM GOAL #6   Title Pt will negotiate 4 steps with B UE support and supervision to improve pt's ability to safely enter/exit her house.   Time 4   Period Weeks   Status New           PT Long Term Goals - 09/07/16 1354      PT LONG TERM GOAL #1   Title Pt will be independent and verbalize understanding of HEP an on-going fitness plan to maintain progress made in therapy  and improve overall health. (Target Date for all LTGs: 11/02/16)   Time 8   Period Weeks   Status New     PT LONG TERM GOAL #2   Title Pt will improve Berg Balance score to > or = 29/56 to indicate an improvement in static balance  and incr safety when performing ADLs.   Time 8   Period Weeks   Status New     PT LONG TERM GOAL #3   Title Pt will improve TUG time to < or = 29.5 sec to indicate an incr in functional mobility and balance when performing ADLs.   Time 8   Period Weeks   Status New     PT LONG TERM GOAL #4   Title Pt will improve gait speed from baseline by 1.2 ft/sec to indicate an incr in functional mobility and safety.   Time 8   Period Weeks   Status New     PT LONG TERM GOAL #5   Title Pt will ambulate 530ft with LRAD and supervision outdoors over level and unlevel paved surfaces, ramps, and curbs to incr functional mobility and safety while ambulating in the community.   Time 8   Period Weeks   Status New     Additional Long Term Goals   Additional Long Term Goals Yes     PT LONG TERM GOAL #6   Title Pt will negotiate 8 steps with supervision and unilat UE support to incr safety while entering/exiting family/friend's home.   Time 8   Period Weeks   Status New               Plan - 09/28/16 1258    Clinical Impression Statement Pt reports feeling better however checked BP before and during session for safety (was 148/86 following walking into clinic and 123/85 following standing exercise).  Performed standing exercises during session and gait with RW with negotiation of curb/ramp.     Rehab Potential Fair   Clinical Impairments Affecting Rehab Potential Chronic stroke status, active ovarian CA, HTN, expressive aphasia,    PT Frequency 2x / week   PT Duration 8 weeks   PT Treatment/Interventions ADLs/Self Care Home Management;Functional mobility training;Stair training;Gait training;DME Instruction;Therapeutic activities;Therapeutic exercise;Balance  training;Neuromuscular re-education;Patient/family education;Orthotic Fit/Training;Manual techniques;Energy conservation   PT Next Visit Plan Start looking at [place].  LE strengthening and balance activities that challenge activity tolerance, gait with RW with hand orthotic on compliant surfaces and continue to work on obstacle negotiation/barriers   Consulted and Agree with Plan of Care Patient      Patient will benefit from skilled therapeutic intervention in order to improve the following deficits and impairments:  Abnormal gait, Cardiopulmonary status limiting activity, Decreased activity tolerance, Decreased balance, Decreased mobility, Decreased knowledge of use of DME, Decreased knowledge of precautions, Decreased endurance, Decreased safety awareness, Decreased strength, Impaired perceived functional ability, Impaired flexibility, Improper body mechanics, Impaired UE functional use  Visit Diagnosis: Hemiplegia and hemiparesis following cerebral infarction affecting right non-dominant side (HCC)  Unsteadiness on feet  Muscle weakness (generalized)     Problem List Patient Active Problem List   Diagnosis Date Noted  . Genetic testing 08/17/2016  . Leukopenia due to antineoplastic chemotherapy ([place]) 06/05/2016  . Costochondritis, acute 05/02/2016  . Ovarian cancer ([place]) 03/24/2016  . Anemia due to antineoplastic chemotherapy 03/17/2016  . Port catheter in place 03/17/2016  . Portacath in place 02/05/2016  . Chemotherapy-induced peripheral neuropathy ([place]) 02/05/2016  . Hematoma 01/27/2016  . Chronic anticoagulation 01/22/2016  . Chronic atrial fibrillation ([place]) 01/22/2016  . PICC (peripherally inserted central catheter) in place 01/22/2016  . Chemotherapy induced neutropenia ([place]) 01/22/2016  . Poor venous access 01/22/2016  . CVA (cerebral vascular accident) ([place]) 12/27/2015  . Right hemiparesis ([place]) 12/27/2015  . Expressive aphasia 12/27/2015  .  History of left breast  cancer 12/27/2015  . H/O tubal ligation 12/27/2015  . Colon polyps 12/27/2015  . International Federation of Gynecology and Obstetrics (FIGO) stage IVB epithelial ovarian cancer ([place]) 12/27/2015  . Ovarian cancer, bilateral ([place]) 12/24/2015  . Inguinal adenopathy   . Carcinomatosis ([place]) 12/06/2015  . Essential hypertension, benign 07/18/2014  . Atrial fibrillation, unspecified 07/18/2014  . Anxiety state, unspecified 07/18/2014    Cameron Sprang, PT, MPT [facility] [address] [place], [place], [address] Phone: [phone]   Fax:  [phone] 09/28/16, 1:04 PM  Name: Brenda Cunningham MRN: [id number] Date of Birth: May 19, 1951

## 2016-09-30 ENCOUNTER — Encounter (HOSPITAL_COMMUNITY): Payer: Self-pay

## 2016-09-30 ENCOUNTER — Ambulatory Visit: Payer: Medicare Other

## 2016-09-30 ENCOUNTER — Other Ambulatory Visit (HOSPITAL_BASED_OUTPATIENT_CLINIC_OR_DEPARTMENT_OTHER): Payer: Medicare Other

## 2016-09-30 ENCOUNTER — Ambulatory Visit (HOSPITAL_COMMUNITY)
Admission: RE | Admit: 2016-09-30 | Discharge: 2016-09-30 | Disposition: A | Discharge status: Home / Self Care | Payer: Medicare Other | Source: Ambulatory Visit | Attending: Gynecologic Oncology | Admitting: Gynecologic Oncology

## 2016-09-30 DIAGNOSIS — Z90710 Acquired absence of both cervix and uterus: Secondary | ICD-10-CM | POA: Diagnosis not present

## 2016-09-30 DIAGNOSIS — C562 Malignant neoplasm of left ovary: Secondary | ICD-10-CM

## 2016-09-30 DIAGNOSIS — I700 Atherosclerosis of aorta: Secondary | ICD-10-CM | POA: Diagnosis not present

## 2016-09-30 DIAGNOSIS — C563 Malignant neoplasm of bilateral ovaries: Secondary | ICD-10-CM

## 2016-09-30 DIAGNOSIS — C561 Malignant neoplasm of right ovary: Secondary | ICD-10-CM | POA: Diagnosis not present

## 2016-09-30 DIAGNOSIS — C800 Disseminated malignant neoplasm, unspecified: Secondary | ICD-10-CM

## 2016-09-30 DIAGNOSIS — K769 Liver disease, unspecified: Secondary | ICD-10-CM | POA: Insufficient documentation

## 2016-09-30 DIAGNOSIS — Z95828 Presence of other vascular implants and grafts: Secondary | ICD-10-CM

## 2016-09-30 DIAGNOSIS — R1909 Other intra-abdominal and pelvic swelling, mass and lump: Secondary | ICD-10-CM | POA: Diagnosis not present

## 2016-09-30 LAB — COMPREHENSIVE METABOLIC PANEL
ALT: 31 U/L (ref 0–55)
AST: 22 U/L (ref 5–34)
Albumin: 3.5 g/dL (ref 3.5–5.0)
Alkaline Phosphatase: 119 U/L (ref 40–150)
Anion Gap: 9 mEq/L (ref 3–11)
BUN: 14.5 mg/dL (ref 7.0–26.0)
CO2: 27 mEq/L (ref 22–29)
Calcium: 9.5 mg/dL (ref 8.4–10.4)
Chloride: 106 mEq/L (ref 98–109)
Creatinine: 0.7 mg/dL (ref 0.6–1.1)
EGFR: >90 mL/min/{1.73_m2} (ref >90)
Glucose: 100 mg/dl (ref 70–140)
Potassium: 3.6 mEq/L (ref 3.5–5.1)
Sodium: 142 mEq/L (ref 136–145)
Total Bilirubin: 0.44 mg/dL (ref 0.20–1.20)
Total Protein: 7.1 g/dL (ref 6.4–8.3)

## 2016-09-30 LAB — CBC WITH DIFFERENTIAL/PLATELET
BASO%: 0.4 % (ref 0.0–2.0)
Basophils Absolute: 0 10*3/uL (ref 0.0–0.1)
EOS%: 1.6 % (ref 0.0–7.0)
Eosinophils Absolute: 0.1 10*3/uL (ref 0.0–0.5)
HCT: 37.2 % (ref 34.8–46.6)
HGB: 12.5 g/dL (ref 11.6–15.9)
LYMPH%: 45.4 % (ref 14.0–49.7)
MCH: 33.2 pg (ref 25.1–34.0)
MCHC: 33.6 g/dL (ref 31.5–36.0)
MCV: 98.7 fL (ref 79.5–101.0)
MONO#: 0.4 10*3/uL (ref 0.1–0.9)
MONO%: 8.2 % (ref 0.0–14.0)
NEUT#: 2.2 10*3/uL (ref 1.5–6.5)
NEUT%: 44.4 % (ref 38.4–76.8)
Platelets: 187 10*3/uL (ref 145–400)
RBC: 3.77 10*6/uL (ref 3.70–5.45)
RDW: 14.5 % (ref 11.2–14.5)
WBC: 4.9 10*3/uL (ref 3.9–10.3)
lymph#: 2.2 10*3/uL (ref 0.9–3.3)

## 2016-09-30 MED ORDER — SODIUM CHLORIDE 0.9 % IJ SOLN
10.0000 mL | INTRAMUSCULAR | Status: DC | PRN
  Administered 2016-09-30: 10 mL via INTRAVENOUS
  Filled 2016-09-30: qty 10

## 2016-09-30 MED ORDER — IOPAMIDOL (ISOVUE-300) INJECTION 61%
100.0000 mL | Freq: Once | INTRAVENOUS | Status: AC | PRN
Start: 2016-09-30 — End: 2016-09-30
  Administered 2016-09-30: 100 mL via INTRAVENOUS

## 2016-10-01 ENCOUNTER — Ambulatory Visit: Payer: Medicare Other | Admitting: Occupational Therapy

## 2016-10-01 ENCOUNTER — Ambulatory Visit: Payer: Medicare Other | Admitting: Rehabilitation

## 2016-10-01 ENCOUNTER — Ambulatory Visit: Payer: Medicare Other

## 2016-10-01 ENCOUNTER — Encounter: Payer: Self-pay | Admitting: Occupational Therapy

## 2016-10-01 ENCOUNTER — Encounter: Payer: Self-pay | Admitting: Rehabilitation

## 2016-10-01 DIAGNOSIS — I69353 Hemiplegia and hemiparesis following cerebral infarction affecting right non-dominant side: Secondary | ICD-10-CM

## 2016-10-01 DIAGNOSIS — R2689 Other abnormalities of gait and mobility: Secondary | ICD-10-CM

## 2016-10-01 DIAGNOSIS — R2681 Unsteadiness on feet: Secondary | ICD-10-CM

## 2016-10-01 DIAGNOSIS — R41842 Visuospatial deficit: Secondary | ICD-10-CM

## 2016-10-01 DIAGNOSIS — M6281 Muscle weakness (generalized): Secondary | ICD-10-CM

## 2016-10-01 DIAGNOSIS — M25511 Pain in right shoulder: Secondary | ICD-10-CM | POA: Diagnosis not present

## 2016-10-01 DIAGNOSIS — R4701 Aphasia: Secondary | ICD-10-CM

## 2016-10-01 DIAGNOSIS — M25611 Stiffness of right shoulder, not elsewhere classified: Secondary | ICD-10-CM | POA: Diagnosis not present

## 2016-10-01 DIAGNOSIS — G8929 Other chronic pain: Secondary | ICD-10-CM | POA: Diagnosis not present

## 2016-10-01 DIAGNOSIS — R41844 Frontal lobe and executive function deficit: Secondary | ICD-10-CM

## 2016-10-01 LAB — CA 125: Cancer Antigen (CA) 125: 9.6 U/mL (ref 0.0–38.1)

## 2016-10-01 NOTE — Patient Instructions (Signed)
  Please complete the assigned speech therapy homework for 15 minutes, twice each day.

## 2016-10-01 NOTE — Therapy (Signed)
[facility] [address] [place], [place], [address] Phone: [phone]   Fax:  [phone]  Occupational Therapy Treatment  Patient Details  Name: Brenda Cunningham MRN: [id number] Date of Birth: 02-Mar-1951 Referring Provider: Helane Rima  Encounter Date: 10/01/2016      OT End of Session - 10/01/16 1546    Visit Number 6   Number of Visits 16   Date for OT Re-Evaluation 11/02/16   Authorization Type medicare will need G code and PN every 10th visit   Authorization Time Period 60 days   Authorization - Visit Number 6   Authorization - Number of Visits 10   OT Start Time 1316   OT Stop Time 1359   OT Time Calculation (min) 43 min   Activity Tolerance Patient tolerated treatment well      Past Medical History:  Diagnosis Date  . A-fib ([place])   . Anxiety   . Arthritis   . Breast cancer ([place]) 2008   s/p lumpectomy, chemo and radiation therapy  . Complication of anesthesia    DIFFICULTY WAKING UP FROM ANESTHESIA  . Hyperlipidemia   . Hypertension   . Stroke ([place]) 07/29/2015  . Wears glasses     Past Surgical History:  Procedure Laterality Date  . APPENDECTOMY    . BREAST LUMPECTOMY  2008   left  . COLONOSCOPY  2004  . DEBULKING N/A 03/24/2016   Procedure: RADICAL TUMOR DEBULKING;  Surgeon: Everitt Amber, MD;  Location: [facility];  Service: Gynecology;  Laterality: N/A;  . OMENTECTOMY N/A 03/24/2016   Procedure: OMENTECTOMY;  Surgeon: Everitt Amber, MD;  Location: [facility];  Service: Gynecology;  Laterality: N/A;  . ROBOTIC ASSISTED TOTAL HYSTERECTOMY WITH BILATERAL SALPINGO OOPHERECTOMY Bilateral 03/24/2016   Procedure: XI ROBOTIC ASSISTED TOTA LAPAROSCOPIC  HYSTERECTOMY WITH BILATERAL SALPINGO OOPHORECTOMY;  Surgeon: Everitt Amber, MD;  Location: [facility];  Service: Gynecology;  Laterality: Bilateral;  . TUBAL LIGATION      There were no vitals filed for this visit.      Subjective Assessment - 10/01/16 1324    Subjective  I am so tired today - I walked alot yesterday   Pertinent History see epic snapshot - PT WITH ACTIVE OVARIAN CANCER and cardiomegaly;  high risk for recurrence of stroke.    Patient Stated Goals Walk and my hand - I like being here.  I want to be able to walk in to my dt's house by November.   Currently in Pain? No/denies                      OT Treatments/Exercises (OP) - 10/01/16 0001      ADLs   Functional Mobility Practiced tub bench transfers as pt stated she can get into tub without assistance but can't get out - dtr is assisting with sit to stand.  WIth cueing and strategy pt can do now with supervision - pt benefitted from practice and repetition.  Pt uses cane inside bathroom and needs supervision but no physical assist.     Neurological Re-education Exercises   Other Exercises 1 Neuro re ed to address overhead reach in supine in closed chain activity - pt with no pain in supine - transitioned to sitting and addressed unilateral mid to beginning high reach - pt able to achieve 80* of flexion with min compensations and 1/10 pain in sitting.  OT Short Term Goals - 10/01/16 1543      OT SHORT TERM GOAL #1   Title Pt and family will be mod I with HEP - 10/06/2016   Status Achieved     OT SHORT TERM GOAL #2   Title Pt will report no more than 3/10 pain in R shoulder with overhead reach   Status Achieved     OT SHORT TERM GOAL #3   Title Pt will demonstrate 80* of shoulder flexion and abduction in RUE for overhead reach   Status Achieved     OT SHORT TERM GOAL #4   Title Pt will require no more than supervision for static standing balance in prep for functional activity in standing   Status Achieved     OT SHORT TERM GOAL #5   Title Pt will be mod I with hiking pants 100% of the time.   Status Achieved           OT Long Term Goals - 10/01/16 1544      OT LONG TERM GOAL #1   Title Pt and family will be mod I with  upgraded HEP - 11/03/2015   Status On-going     OT LONG TERM GOAL #2   Title Pt will require no more than min a for dynamic standing balance during functional activity   Status On-going     OT LONG TERM GOAL #3   Title Pt will report no than 2/10 pain with overhead reach   Status Achieved     OT LONG TERM GOAL #4   Title Pt will demonstrate 85* of shoulder flexion for overhead reach   Status On-going     OT LONG TERM GOAL #5   Title Pt will demonstrate 80* of abduction for overhead reach   Status On-going     OT LONG TERM GOAL #6   Title Pt will be supevsion only for tub bench transfers   Status Achieved     OT LONG TERM GOAL #7   Title Pt will use RUE as stabilzer with splint during basic ADL tasks prn   Status On-going     OT LONG TERM GOAL #8   Title Pt will tolerate at least 10 minutes of standing activity without requiring rest break.   Status On-going               Plan - 10/01/16 1544    Clinical Impression Statement Pt has met all STG"s and is now progressing toward LTG's.  Pt with improving balance, activity tolerance and awareness of RUE   Rehab Potential Fair   Clinical Impairments Affecting Rehab Potential given severity of deficits, medical status   OT Frequency 2x / week   OT Duration 8 weeks   OT Treatment/Interventions Self-care/ADL training;Electrical Stimulation;Moist Heat;DME and/or AE instruction;Neuromuscular education;Therapeutic exercise;Functional Mobility Training;Manual Therapy;Passive range of motion;Splinting;Therapeutic activities;Balance training;Patient/family education;Cognitive remediation/compensation;Visual/perceptual remediation/compensation   Plan grasp and release with splint, address shoulder pain and manual therapy to increase painfree ROM, NMR for RUE overhead reach and functional use   Consulted and Agree with Plan of Care Patient      Patient will benefit from skilled therapeutic intervention in order to improve the  following deficits and impairments:  Abnormal gait, Cardiopulmonary status limiting activity, Decreased activity tolerance, Decreased balance, Decreased cognition, Decreased mobility, Decreased range of motion, Decreased safety awareness, Difficulty walking, Decreased strength, Impaired UE functional use, Impaired tone, Impaired sensation, Impaired vision/preception, Pain  Visit Diagnosis: Hemiplegia and hemiparesis following  cerebral infarction affecting right non-dominant side (HCC)  Unsteadiness on feet  Muscle weakness (generalized)  Stiffness of right shoulder, not elsewhere classified  Visuospatial deficit  Frontal lobe and executive function deficit    Problem List Patient Active Problem List   Diagnosis Date Noted  . Genetic testing 08/17/2016  . Leukopenia due to antineoplastic chemotherapy ([place]) 06/05/2016  . Costochondritis, acute 05/02/2016  . Ovarian cancer ([place]) 03/24/2016  . Anemia due to antineoplastic chemotherapy 03/17/2016  . Port catheter in place 03/17/2016  . Portacath in place 02/05/2016  . Chemotherapy-induced peripheral neuropathy ([place]) 02/05/2016  . Hematoma 01/27/2016  . Chronic anticoagulation 01/22/2016  . Chronic atrial fibrillation ([place]) 01/22/2016  . PICC (peripherally inserted central catheter) in place 01/22/2016  . Chemotherapy induced neutropenia ([place]) 01/22/2016  . Poor venous access 01/22/2016  . CVA (cerebral vascular accident) ([place]) 12/27/2015  . Right hemiparesis ([place]) 12/27/2015  . Expressive aphasia 12/27/2015  . History of left breast cancer 12/27/2015  . H/O tubal ligation 12/27/2015  . Colon polyps 12/27/2015  . International Federation of Gynecology and Obstetrics (FIGO) stage IVB epithelial ovarian cancer ([place]) 12/27/2015  . Ovarian cancer, bilateral ([place]) 12/24/2015  . Inguinal adenopathy   . Carcinomatosis ([facility]) 12/06/2015  . Essential hypertension, benign 07/18/2014  . Atrial fibrillation, unspecified 07/18/2014  .  Anxiety state, unspecified 07/18/2014    Quay Burow, OTR/L 10/01/2016, 3:47 PM  [facility] [address] [place], [place], [address] Phone: [phone]   Fax:  [phone]  Name: Brenda Cunningham MRN: [id number] Date of Birth: 30-Oct-1951

## 2016-10-01 NOTE — Therapy (Signed)
[facility] [address] [place], [place], [address] Phone: [phone]   Fax:  [phone]  Speech Language Pathology Treatment  Patient Details  Name: Brenda Cunningham MRN: [id number] Date of Birth: 05-Feb-1951 Referring Provider: Darra Lis, M.D.  Encounter Date: 10/01/2016      End of Session - 10/01/16 1648    Visit Number 4   Number of Visits 17   Date for SLP Re-Evaluation 12/04/16   SLP Start Time 1532   SLP Stop Time  [id number]   SLP Time Calculation (min) 43 min   Activity Tolerance Patient tolerated treatment well      Past Medical History:  Diagnosis Date  . A-fib ([place])   . Anxiety   . Arthritis   . Breast cancer ([place]) 2008   s/p lumpectomy, chemo and radiation therapy  . Complication of anesthesia    DIFFICULTY WAKING UP FROM ANESTHESIA  . Hyperlipidemia   . Hypertension   . Stroke ([place]) 07/29/2015  . Wears glasses     Past Surgical History:  Procedure Laterality Date  . APPENDECTOMY    . BREAST LUMPECTOMY  2008   left  . COLONOSCOPY  2004  . DEBULKING N/A 03/24/2016   Procedure: RADICAL TUMOR DEBULKING;  Surgeon: Everitt Amber, MD;  [facility];  Service: Gynecology;  Laterality: N/A;  . OMENTECTOMY N/A 03/24/2016   Procedure: OMENTECTOMY;  Surgeon: Everitt Amber, MD;  Location: [facility];  Service: Gynecology;  Laterality: N/A;  . ROBOTIC ASSISTED TOTAL HYSTERECTOMY WITH BILATERAL SALPINGO OOPHERECTOMY Bilateral 03/24/2016   Procedure: XI ROBOTIC ASSISTED TOTA LAPAROSCOPIC  HYSTERECTOMY WITH BILATERAL SALPINGO OOPHORECTOMY;  Surgeon: Everitt Amber, MD;  Location[facility];  Service: Gynecology;  Laterality: Bilateral;  . TUBAL LIGATION      There were no vitals filed for this visit.      Subjective Assessment - 10/01/16 1543    Subjective "I had my son walk me to the hospital. I was a lof."   Currently in Pain? No/denies               ADULT SLP TREATMENT - 10/01/16 1546      General  Information   Behavior/Cognition Alert;Cooperative;Pleasant mood     Treatment Provided   Treatment provided Cognitive-Linquistic     Cognitive-Linquistic Treatment   Treatment focused on Aphasia   Skilled Treatment "Talking slow," was pt's response to SLP question of what was going to make it easier for people to understand her. Sentence generation given one word: pt req'd min-mod verbal cues (semantic) from SLP occasionally. Pt described pictures with perseveration/semantic errors usually. With consistent cues from SLP (min to mod-max for word choice), pt production improved.      Assessment / Recommendations / Plan   Plan Continue with current plan of care     Progression Toward Goals   Progression toward goals Progressing toward goals            SLP Short Term Goals - 10/01/16 1650      SLP SHORT TERM GOAL #1   Title pt will generate appropriate language to participate in functional 10 minutes mod complex converastion with occasional  min A   Time 3   Period Weeks   Status On-going     SLP SHORT TERM GOAL #2   Title pt will generate sentence level responses in structured tasks with 90% success over three sessions    Time 3   Period Weeks   Status On-going  SLP SHORT TERM GOAL #3   Title pt will demo understanding of 10 minutes mod-complex conversation with rare min A   Baseline 10/23,    Time 3   Period Weeks   Status On-going     SLP SHORT TERM GOAL #4   Title pt will attempt multimodal communication PRN in mod complex conversation   Time 3   Period Weeks   Status On-going          SLP Long Term Goals - 10/01/16 1651      SLP LONG TERM GOAL #1   Title pt will engage in mod complex-complex conversation functionally with modified independence (multimodal communication, compensations, extra time) over 3 sessions   Time 7   Period Weeks   Status On-going     SLP LONG TERM GOAL #2   Title pt will demo understanding of mod complex-complex conversation  with modified independence   Time 7   Period Weeks   Status On-going          Plan - 10/01/16 1649    Clinical Impression Statement Pt with persistent aphasia and dysfluency. Pt able to use compensatory strategies of slower rate and descriptive language with min-mod A. SLP provision of verbal cues also proved helpful for pt's semantic errors during picture description tasks. Skilled ST remains needed to maximize pt's verbal communicaiton.    Speech Therapy Frequency 2x / week   Duration --  8 weeks   Treatment/Interventions Language facilitation;Internal/external aids;SLP instruction and feedback;Multimodal communcation approach;Functional tasks;Patient/family education;Compensatory strategies   Potential to Achieve Goals Good   Potential Considerations Severity of impairments   Consulted and Agree with Plan of Care Patient      Patient will benefit from skilled therapeutic intervention in order to improve the following deficits and impairments:   Aphasia    Problem List Patient Active Problem List   Diagnosis Date Noted  . Genetic testing 08/17/2016  . Leukopenia due to antineoplastic chemotherapy ([place]) 06/05/2016  . Costochondritis, acute 05/02/2016  . Ovarian cancer ([place]) 03/24/2016  . Anemia due to antineoplastic chemotherapy 03/17/2016  . Port catheter in place 03/17/2016  . Portacath in place 02/05/2016  . Chemotherapy-induced peripheral neuropathy ([place]) 02/05/2016  . Hematoma 01/27/2016  . Chronic anticoagulation 01/22/2016  . Chronic atrial fibrillation ([place]) 01/22/2016  . PICC (peripherally inserted central catheter) in place 01/22/2016  . Chemotherapy induced neutropenia ([place]) 01/22/2016  . Poor venous access 01/22/2016  . CVA (cerebral vascular accident) ([place]) 12/27/2015  . Right hemiparesis ([place]) 12/27/2015  . Expressive aphasia 12/27/2015  . History of left breast cancer 12/27/2015  . H/O tubal ligation 12/27/2015  . Colon polyps 12/27/2015  .  International Federation of Gynecology and Obstetrics (FIGO) stage IVB epithelial ovarian cancer ([place]) 12/27/2015  . Ovarian cancer, bilateral ([place]) 12/24/2015  . Inguinal adenopathy   . Carcinomatosis ([place]) 12/06/2015  . Essential hypertension, benign 07/18/2014  . Atrial fibrillation, unspecified 07/18/2014  . Anxiety state, unspecified 07/18/2014    [facility] ,[place], CCC-SLP  10/01/2016, 4:52 PM  [facility] [address] [place], [place], [address] Phone: [phone]   Fax:  [phone]   Name: Sharyle Vanderberg MRN: [id number] Date of Birth: [birth year]

## 2016-10-01 NOTE — Therapy (Signed)
[facility] [address] [place], [place], [address] Phone: [phone]   Fax:  [phone]  Physical Therapy Treatment  Patient Details  Name: Brenda Cunningham MRN: [id number] Date of Birth: [birth year] Referring Provider: Helane Rima, MD  Encounter Date: 10/01/2016      PT End of Session - 10/01/16 1452    Visit Number 7   Number of Visits 17   Date for PT Re-Evaluation 11/06/16   Authorization Type Medicare Part A and B   Authorization Time Period G-Code every 10th visit   PT Start Time 1447   PT Stop Time 1530   PT Time Calculation (min) 43 min   Equipment Utilized During Treatment Gait belt   Activity Tolerance Patient tolerated treatment well   Behavior During Therapy WFL for tasks assessed/performed      Past Medical History:  Diagnosis Date  . A-fib ([place])   . Anxiety   . Arthritis   . Breast cancer ([place]) 2008   s/p lumpectomy, chemo and radiation therapy  . Complication of anesthesia    DIFFICULTY WAKING UP FROM ANESTHESIA  . Hyperlipidemia   . Hypertension   . Stroke ([place]) 07/29/2015  . Wears glasses     Past Surgical History:  Procedure Laterality Date  . APPENDECTOMY    . BREAST LUMPECTOMY  2008   left  . COLONOSCOPY  2004  . DEBULKING N/A 03/24/2016   Procedure: RADICAL TUMOR DEBULKING;  Surgeon: Everitt Amber, MD;  Location: [facility];  Service: Gynecology;  Laterality: N/A;  . OMENTECTOMY N/A 03/24/2016   Procedure: OMENTECTOMY;  Surgeon: Everitt Amber, MD;  Location: [facility];  Service: Gynecology;  Laterality: N/A;  . ROBOTIC ASSISTED TOTAL HYSTERECTOMY WITH BILATERAL SALPINGO OOPHERECTOMY Bilateral 03/24/2016   Procedure: XI ROBOTIC ASSISTED TOTA LAPAROSCOPIC  HYSTERECTOMY WITH BILATERAL SALPINGO OOPHORECTOMY;  Surgeon: Everitt Amber, MD;  Location: [facility];  Service: Gynecology;  Laterality: Bilateral;  . TUBAL LIGATION      There were no vitals filed for this visit.      Subjective Assessment  - 10/01/16 1451    Subjective "I walked all over the hospital yesterday."    Limitations Standing;Walking;House hold activities   Patient Stated Goals "I want to get up and see my daughter and get on the train to see my sons in [place]."   Currently in Pain? No/denies                         [facility] Adult PT Treatment/Exercise - 10/01/16 1500      Ambulation/Gait   Ambulation/Gait Yes   Ambulation/Gait Assistance 5: Supervision   Assistive device Rolling walker   Gait Pattern Step-through pattern;Decreased step length - left;Decreased stance time - right;Decreased stride length;Decreased hip/knee flexion - right;Trunk flexed;Narrow base of support;Poor foot clearance - right   Ambulation Surface Level;Indoor   Gait velocity 2.44 ft/sec w/ RW S level      Standardized Balance Assessment   Standardized Balance Assessment Berg Balance Test;Timed Up and Go Test     Berg Balance Test   Sit to Stand Able to stand  independently using hands   Standing Unsupported Able to stand 2 minutes with supervision   Sitting with Back Unsupported but Feet Supported on Floor or Stool Able to sit safely and securely 2 minutes   Stand to Sit Controls descent by using hands   Transfers Able to transfer with verbal cueing and /or supervision   Standing  Unsupported with Eyes Closed Able to stand 10 seconds with supervision   Standing Ubsupported with Feet Together Needs help to attain position but able to stand for 30 seconds with feet together   From Standing, Reach Forward with Outstretched Arm Reaches forward but needs supervision   From Standing Position, Pick up Object from Floor Unable to try/needs assist to keep balance   From Standing Position, Turn to Look Behind Over each Shoulder Turn sideways only but maintains balance   Turn 360 Degrees Needs assistance while turning   Standing Unsupported, Alternately Place Feet on Step/Stool Needs assistance to keep from falling or unable to  try   Standing Unsupported, One Foot in [place] help to step but can hold 15 seconds   Standing on One Leg Unable to try or needs assist to prevent fall   Total Score 23     Timed Up and Go Test   TUG Normal TUG   Normal TUG (seconds) 32     Therapeutic Activites    Therapeutic Activities Other Therapeutic Activities   Other Therapeutic Activities Note pt practiced getting in/out of tub/shower via tub bench during OT session and OT requesting to practice without shoes to ensure safety.  She was able to perform at S level with [facility] during session.  Also provided education on use of shoe horn to assist getting R shoe donned.                  PT Education - 10/01/16 1452    Education provided Yes   Education Details Education on safety getting up/down from tub bench   Person(s) Educated Patient   Methods Explanation   Comprehension Verbalized understanding          PT Short Term Goals - 10/01/16 1506      PT SHORT TERM GOAL #1   Title Pt will be independent with assistance from caregiver and verbalize understand of initial HEP to further progress toward her goals. (Target Date for all STGs: 10/05/16)   Time 4   Period Weeks   Status New     PT SHORT TERM GOAL #2   Title Pt will improve Berg Balance score to > or = 26/56 to indicate an improvement in static balance.   Baseline 23/56 on 10/01/16   Time 4   Period Weeks   Status Not Met     PT SHORT TERM GOAL #3   Title Pt will improve TUG time to < or = 32.5 sec indicating an improvemet in functional mobility and balance.   Baseline 32.00 seconds with RW and HO at S level on 10/01/16   Time 4   Period Weeks   Status Achieved     PT SHORT TERM GOAL #4   Title Pt will improve initial gait speed by 0.6 ft/sec to indicate improvement in functional mobility and functional status.   Baseline 2.44 ft/sec on 10/01/16 (up from 2.03 ft/sec with min/guard)   Time 4   Period Weeks   Status Partially Met     PT SHORT  TERM GOAL #5   Title Pt will ambulate 254f with LRAD and supervision indoors over level surfaces including carpet and hard flors to incr ability to safely ambulate at home.   Time 4   Period Weeks   Status New     PT SHORT TERM GOAL #6   Title Pt will negotiate 4 steps with B UE support and supervision to improve pt's ability to safely  enter/exit her house.   Time 4   Period Weeks   Status New           PT Long Term Goals - 09/07/16 1354      PT LONG TERM GOAL #1   Title Pt will be independent and verbalize understanding of HEP an on-going fitness plan to maintain progress made in therapy and improve overall health. (Target Date for all LTGs: 11/02/16)   Time 8   Period Weeks   Status New     PT LONG TERM GOAL #2   Title Pt will improve Berg Balance score to > or = 29/56 to indicate an improvement in static balance and incr safety when performing ADLs.   Time 8   Period Weeks   Status New     PT LONG TERM GOAL #3   Title Pt will improve TUG time to < or = 29.5 sec to indicate an incr in functional mobility and balance when performing ADLs.   Time 8   Period Weeks   Status New     PT LONG TERM GOAL #4   Title Pt will improve gait speed from baseline by 1.2 ft/sec to indicate an incr in functional mobility and safety.   Time 8   Period Weeks   Status New     PT LONG TERM GOAL #5   Title Pt will ambulate 544f with LRAD and supervision outdoors over level and unlevel paved surfaces, ramps, and curbs to incr functional mobility and safety while ambulating in the community.   Time 8   Period Weeks   Status New     Additional Long Term Goals   Additional Long Term Goals Yes     PT LONG TERM GOAL #6   Title Pt will negotiate 8 steps with supervision and unilat UE support to incr safety while entering/exiting family/friend's home.   Time 8   Period Weeks   Status New               Plan - 10/01/16 1452    Clinical Impression Statement Skilled session  focused on beginning to assess STG's.  Pt has not made any gains with BERG balance score, but note she has met TUG goal and making progress with gait speed.  Note her confidence in her gait is improving and she is ambulating more at community level.     Rehab Potential Fair   Clinical Impairments Affecting Rehab Potential Chronic stroke status, active ovarian CA, HTN, expressive aphasia,    PT Frequency 2x / week   PT Duration 8 weeks   PT Treatment/Interventions ADLs/Self Care Home Management;Functional mobility training;Stair training;Gait training;DME Instruction;Therapeutic activities;Therapeutic exercise;Balance training;Neuromuscular re-education;Patient/family education;Orthotic Fit/Training;Manual techniques;Energy conservation   PT Next Visit Plan Finish checking STGS.  LE strengthening and balance activities that challenge activity tolerance, gait with RW with hand orthotic on compliant surfaces and continue to work on obstacle negotiation/barriers   Consulted and Agree with Plan of Care Patient      Patient will benefit from skilled therapeutic intervention in order to improve the following deficits and impairments:  Abnormal gait, Cardiopulmonary status limiting activity, Decreased activity tolerance, Decreased balance, Decreased mobility, Decreased knowledge of use of DME, Decreased knowledge of precautions, Decreased endurance, Decreased safety awareness, Decreased strength, Impaired perceived functional ability, Impaired flexibility, Improper body mechanics, Impaired UE functional use  Visit Diagnosis: Hemiplegia and hemiparesis following cerebral infarction affecting right non-dominant side (HCC)  Unsteadiness on feet  Muscle weakness (generalized)  Other  abnormalities of gait and mobility     Problem List Patient Active Problem List   Diagnosis Date Noted  . Genetic testing 08/17/2016  . Leukopenia due to antineoplastic chemotherapy ([place]) 06/05/2016  . Costochondritis,  acute 05/02/2016  . Ovarian cancer ([place]) 03/24/2016  . Anemia due to antineoplastic chemotherapy 03/17/2016  . Port catheter in place 03/17/2016  . Portacath in place 02/05/2016  . Chemotherapy-induced peripheral neuropathy ([place]) 02/05/2016  . Hematoma 01/27/2016  . Chronic anticoagulation 01/22/2016  . Chronic atrial fibrillation ([place]) 01/22/2016  . PICC (peripherally inserted central catheter) in place 01/22/2016  . Chemotherapy induced neutropenia ([place]) 01/22/2016  . Poor venous access 01/22/2016  . CVA (cerebral vascular accident) ([place]) 12/27/2015  . Right hemiparesis ([place]) 12/27/2015  . Expressive aphasia 12/27/2015  . History of left breast cancer 12/27/2015  . H/O tubal ligation 12/27/2015  . Colon polyps 12/27/2015  . International Federation of Gynecology and Obstetrics (FIGO) stage IVB epithelial ovarian cancer ([place]) 12/27/2015  . Ovarian cancer, bilateral ([place]) 12/24/2015  . Inguinal adenopathy   . Carcinomatosis ([place]) 12/06/2015  . Essential hypertension, benign 07/18/2014  . Atrial fibrillation, unspecified 07/18/2014  . Anxiety state, unspecified 07/18/2014    Cameron Sprang, PT, MPT [facility] [address] [place], [place], [address] Phone: [phone]   Fax:  [phone] 10/01/16, 7:56 PM  Name: Crystle Carelli MRN: [id number] Date of Birth: 08-14-1951

## 2016-10-04 NOTE — Progress Notes (Signed)
Ovarian Cancer Follow-up Note  Consult was initially requested by Dr. Venora Maples for the evaluation of Brenda Cunningham 65 y.o. female with presumed metastatic ovarian cancer  CC:  Chief Complaint  Patient presents with  . Ovarian cancer , bilateral    Follow up    Assessment/Plan:  [name]  is a 65 y.o.  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 S/p interval debulking (optimal) with robotic assisted total hysterectomy, BSO, omentectomy and right external iliac lymphadenectomy on 03/24/16.  Complete response to therapy with no evidence of active disease.  Will follow-up biopsy of vagina - possible scar.  I will see her back in 3 months.   HPI: Brenda Cunningham is a 65 year old G6P6 who is seen in consultation at the request of ED physician, Dr Jola Schmidt for carcinomatosis. The patient reports having a stroke in August 2016 that resulted in expressive aphasia and right upper extremity weakness.  She has been treated for that with aspirin and L a close and is undergoing physical therapy and speech pathology.  The patient began developing vague left lower quadrant and hip discomfort and was seen in the emergency room on 11/29/2015. A CT scan of the abdomen and pelvis was ordered and revealed an indistinct uterus secondary to surrounding peritoneal masses. Internal coarse calcifications consistent with hyalinized fibroids. Bilateral ovaries enlarged and heterogeneous measuring up to 4 cm is on the right. Bilateral pelvic and inguinal adenopathy which appears malignant with a left inguinal lymph node measuring 1.8 mm and a right external iliac lymph node measuring 18 mm. The retroperitoneal adenopathy extensive pelvic brim. Innumerable masses clustered in the pelvis with interleukin peritoneal thickening and small ascites. The omentum has a started appearance.  The  patient denies early satiety and reports mild and vague abdominal bloating. She has a remote history of breast cancer in 2008 for which she was treated with lumpectomy, chemotherapy, and radiation. She is no family history significant for breast or ovarian cancer. She is otherwise treated for atrial fibrillation and has had a remote history of TIAs in 2010. In 2016 she underwent more significant cerebrovascular event which resulted in expressive aphasia and right upper extremity weakness. She also has hypertension and arthritis. Her prior surgical history significant for a left breast lumpectomy, appendectomy, tubal ligation. She's had 6 vaginal deliveries in the past.  She denies vaginal bleeding or history of abnormal Pap smears.  On 12/17/15 she underwent biopsy of the inguinal node which revealed high grade serous carcinoma. She commenced neoadjuvant chemotherapy on 12/27/15 with dose dense carboplatin and paclitaxel. Day 1 of cycle 3 was on 02/14/16.  After cycle 3 she began experiencing profound weakness in both lower extremities and requires maximal assistance at home now with mobility. She was evaluated by her neurologist, Dr Dellis Filbert on 03/03/16 who felt that she may have focal seizure vs syncopal episode. She is scheduled for an EEG on 03/17/16.  CT of the chest abdo and pelvis on 03/02/16 showed excellent response to therapy with resolution of ascites, regression of previously noted peritoneal implants and regression of lymphadenopathy in the abdomen and pelvis. There is continued soft tissue thickening along the perineal surfaces with the largest residual implant being 1.2 x 2.3 cm in the central low anatomic pelvis, and a 1.3 x 0.9 cm lesion in the low left paracolic gutter. The ovaries measure for 0.0 x 2.6  x 4.7 cm on the right and 1.8 x 2.3 x 2.37 m on the left. The uterus is bulky and enlarged with fibroids.  On 02/28/16 CA 125 normalized to 33.6  On 03/24/16 she underwent robotic total  hysterectomy, BSO, right iliac lymphadenectomy, ometectomy. Intraop findings included minimal residual peritoneal and omental disease, enlarged right external iliac lymph node. R0 resection with no macroscopic disease on completion. The pathology showed high grade serous carcinoma in the ovaries, (left ovarian primary), omentum, external iliac lymph node and peritoneum.  She resumed adjuvant chemotherapy on 04/24/16 with carboplatin and paclitaxel for an additional 3 cycles completed on 06/19/16. Her CA 125 normalized during that time to 12 on 07/23/16. Post-treatment CT chest/abdomen/pelvis on 07/23/16 showed residual disease in the right pelvis: 1. Heterogeneously enhancing 4.9 x 3.2 x 4.4 cm mass along the right pelvic sidewall may represent locally recurrent disease or malignant lymphadenopathy. 2. No other signs of definite metastatic disease noted elsewhere in the abdomen or pelvis. 3. Stable low-attenuation hepatic lesion in the caudate lobe of the liver, which appears to demonstrates some progressive centripetal filling on delayed images. This lesion is presumably benign given its stability compared to prior studies, and is favored to represent a small cavernous hemangioma. 4. Cardiomegaly with biatrial dilatation, which is very severe on the right side.  She and her family elected for expectant management as her CA 125 had normalized after therapy and she had substantial toxicity to her prior therapy.   Interval Hx:  She returns today for scheduled surveillance visit. She had a concerning soft tissue mass identified in the right pelvis (near the site of a postive lymph node resection) in July, 2017. She elected for expectant management.  CT abdo/pelvis on 09/30/16 showed almost complete resolution of right pelvic sidewall mass and no other signs of metastatic disease.  CA 125 on 09/30/16 was normal at 9.5.  She reports feeling well, strong, good energy and appetite. There is some left abdominal  wall discomfort during rehab. Brief episode of vaginal spotting 2 days ago.   Current Meds:  Outpatient Encounter Prescriptions as of 10/05/2016  Medication Sig  . acetaminophen (TYLENOL) 500 MG tablet Take 500-1,000 mg by mouth every 6 (six) hours as needed for moderate pain. Reported on 05/14/2016  . apixaban (ELIQUIS) 5 MG TABS tablet Take 1 tablet (5 mg total) by mouth 2 (two) times daily.  Marland Kitchen aspirin EC 81 MG tablet Take 81 mg by mouth daily.  Marland Kitchen atorvastatin (LIPITOR) 40 MG tablet Take 40 mg by mouth at bedtime.  Marland Kitchen dexamethasone (DECADRON) 4 MG tablet Take 5 tablets =20 mg with food 12 hrs before Taxol Chemotherapy  . docusate sodium (COLACE) 100 MG capsule Take 1 capsule (100 mg total) by mouth 2 (two) times daily as needed for mild constipation.  . ferrous fumarate (HEMOCYTE - 106 MG FE) 325 (106 Fe) MG TABS tablet Take 1 tablet daly on an empty stomach with OJ or Vitamin C  . lidocaine-prilocaine (EMLA) cream Apply to Porta-Cath 1-2 hours prior to access as directed.  . loratadine (CLARITIN) 10 MG tablet Take 10 mg by mouth daily as needed (Recommended for chemo pain). Reported on 03/16/2016  . LORazepam (ATIVAN) 0.5 MG tablet Place 1 tablet under the tongue or swallow every 6 hours as needed for nausea. Will make you drowsy.  . metoprolol tartrate (LOPRESSOR) 25 MG tablet Take 12.5 mg by mouth 2 (two) times daily.   . mirtazapine (REMERON) 15 MG tablet Take 15 mg by  mouth at bedtime.  . Multiple Vitamins-Minerals (MULTIVITAMIN WITH MINERALS) tablet Take 1 tablet by mouth daily.  Marland Kitchen omega-3 acid ethyl esters (LOVAZA) 1 g capsule Take 1 g by mouth daily.  . ondansetron (ZOFRAN-ODT) 8 MG disintegrating tablet Take 1 tablet (8 mg total) by mouth every 8 (eight) hours as needed for nausea or vomiting.  Marland Kitchen PARoxetine (PAXIL) 20 MG tablet TAKE 1 TABLET BY MOUTH DAILY (Patient taking differently: TAKE 20 MG BY MOUTH DAILY)  . prochlorperazine (COMPAZINE) 10 MG tablet Take 1 tablet (10 mg total) by  mouth every 6 (six) hours as needed for nausea or vomiting.  . traMADol (ULTRAM) 50 MG tablet Take 2 tablets (100 mg total) by mouth every 12 (twelve) hours as needed for moderate pain.  . verapamil (CALAN-SR) 240 MG CR tablet Take 1 tablet (240 mg total) by mouth daily.   No facility-administered encounter medications on file as of 10/05/2016.     Allergy:  Allergies  Allergen Reactions  . Codeine Hives and Itching  . Latex     Skin breaks out per patient.    Social Hx:   Social History   Social History  . Marital status: Married    Spouse name: N/A  . Number of children: N/A  . Years of education: N/A   Occupational History  . Not on file.   Social History Main Topics  . Smoking status: Never Smoker  . Smokeless tobacco: Never Used  . Alcohol use Yes     Comment: occ beer or glass of wine  . Drug use: No  . Sexual activity: Not on file   Other Topics Concern  . Not on file   Social History Narrative   Lives with her female friend, Mina Marble, walks for exercise.  Originally from [place].    Past Surgical Hx:  Past Surgical History:  Procedure Laterality Date  . APPENDECTOMY    . BREAST LUMPECTOMY  2008   left  . COLONOSCOPY  2004  . DEBULKING N/A 03/24/2016   Procedure: RADICAL TUMOR DEBULKING;  Surgeon: Everitt Amber, MD;  Location: [facility];  Service: Gynecology;  Laterality: N/A;  . OMENTECTOMY N/A 03/24/2016   Procedure: OMENTECTOMY;  Surgeon: Everitt Amber, MD;  Location: [facility];  Service: Gynecology;  Laterality: N/A;  . ROBOTIC ASSISTED TOTAL HYSTERECTOMY WITH BILATERAL SALPINGO OOPHERECTOMY Bilateral 03/24/2016   Procedure: XI ROBOTIC ASSISTED TOTA LAPAROSCOPIC  HYSTERECTOMY WITH BILATERAL SALPINGO OOPHORECTOMY;  Surgeon: Everitt Amber, MD;  Location: [facility];  Service: Gynecology;  Laterality: Bilateral;  . TUBAL LIGATION      Past Medical Hx:  Past Medical History:  Diagnosis Date  . A-fib ([place])   . Anxiety   . Arthritis   . Breast cancer ([place]) 2008   s/p  lumpectomy, chemo and radiation therapy  . Complication of anesthesia    DIFFICULTY WAKING UP FROM ANESTHESIA  . Hyperlipidemia   . Hypertension   . Stroke ([place]) 07/29/2015  . Wears glasses     Past Gynecological History:  SVD x 6  No LMP recorded. Patient is postmenopausal.  Family Hx:  Family History  Problem Relation Age of Onset  . Cirrhosis Father     +heavy EtOH  . Diabetes Maternal Uncle   . Other Daughter     hx benign breast bx    Review of Systems:  Constitutional  Feels well,    ENT Normal appearing ears and nares bilaterally Skin/Breast  No rash, sores, jaundice, itching, dryness Cardiovascular  No  chest pain, shortness of breath, or edema  Pulmonary  No cough or wheeze.  Gastro Intestinal  No nausea, vomitting, or diarrhoea. No bright red blood per rectum, no abdominal pain, change in bowel movement, or constipation.  Genito Urinary  No frequency, urgency, dysuria, + vaginal spotting Musculo Skeletal  Resolved chest wall discomfort,  Neurologic  Improving LE strength with rehab Psychology  No depression, anxiety, insomnia.   Vitals:  Blood pressure 138/74, pulse (!) 57, temperature 98 F (36.7 C), temperature source Oral, resp. rate 18, height 5\' 5"  (1.651 m), weight 190 lb 1.6 oz (86.2 kg), SpO2 100 %.  Physical Exam: WD in NAD Neck  Supple NROM, without any enlargements.  Lymph Node Survey No cervical supraclavicular adenopathy. Left inguinal lymph node is no longer palpable Cardiovascular  Pulse normal rate, regularity and rhythm. S1 and S2 normal.  Lungs  Clear to auscultation bilateraly, without wheezes/crackles/rhonchi. Good air movement.  Skin  No rash/lesions/breakdown  Psychiatry  Alert and oriented to person, place, and time  Abdomen  Normoactive bowel sounds, abdomen soft, non-tender and overweight without evidence of hernia. Slightly distended. No masses. Well healed abdominal incisions Back No CVA tenderness Genito Urinary   Vulva/vagina: Normal external female genitalia.  No lesions. No discharge or bleeding.  Bladder/urethra:  No lesions or masses, well supported bladder  Vagina: vaginal cuff smooth but a nodular area appreciated at left vaginal cuff with mild overlying ectasia. Lower aspect of right pelvic sidewall mass NO LONGER appreciated on deep exam.   Cervix: surgically absent .  Uterus: surgically absent.  Adnexa: as above . Rectal  Good tone, no masses no cul de sac nodularity.  Extremities  No bilateral cyanosis, clubbing or edema.  PROCEDURE NOTE The patient provided verbal informed consent. Brief timeout was obtained. A vaginal biopsy forcep was utilized to obtain 2 small pieces of tissue from the left vaginal cuff. Hemostasis was achieved with silver nitrate. EBL was less than 2 mL. The patient tolerated the procedure well. Specimen left vaginal cuff facet for permanent pathology.  Donaciano Eva, MD  10/05/2016, 11:40 AM

## 2016-10-05 ENCOUNTER — Ambulatory Visit: Payer: Medicare Other | Admitting: Physical Therapy

## 2016-10-05 ENCOUNTER — Ambulatory Visit: Payer: Medicare Other | Admitting: *Deleted

## 2016-10-05 ENCOUNTER — Encounter: Payer: Self-pay | Admitting: Gynecologic Oncology

## 2016-10-05 ENCOUNTER — Ambulatory Visit: Payer: Medicare Other | Attending: Gynecologic Oncology | Admitting: Gynecologic Oncology

## 2016-10-05 ENCOUNTER — Ambulatory Visit: Payer: Medicare Other | Admitting: Occupational Therapy

## 2016-10-05 VITALS — BP 138/74 | HR 57 | Temp 98.0°F | Resp 18 | Ht 65.0 in | Wt 190.1 lb

## 2016-10-05 DIAGNOSIS — I10 Essential (primary) hypertension: Secondary | ICD-10-CM | POA: Diagnosis not present

## 2016-10-05 DIAGNOSIS — I4891 Unspecified atrial fibrillation: Secondary | ICD-10-CM | POA: Insufficient documentation

## 2016-10-05 DIAGNOSIS — Z885 Allergy status to narcotic agent status: Secondary | ICD-10-CM | POA: Insufficient documentation

## 2016-10-05 DIAGNOSIS — Z79899 Other long term (current) drug therapy: Secondary | ICD-10-CM | POA: Insufficient documentation

## 2016-10-05 DIAGNOSIS — N898 Other specified noninflammatory disorders of vagina: Secondary | ICD-10-CM | POA: Diagnosis not present

## 2016-10-05 DIAGNOSIS — E785 Hyperlipidemia, unspecified: Secondary | ICD-10-CM | POA: Diagnosis not present

## 2016-10-05 DIAGNOSIS — Z90710 Acquired absence of both cervix and uterus: Secondary | ICD-10-CM | POA: Diagnosis not present

## 2016-10-05 DIAGNOSIS — C561 Malignant neoplasm of right ovary: Secondary | ICD-10-CM

## 2016-10-05 DIAGNOSIS — Z90722 Acquired absence of ovaries, bilateral: Secondary | ICD-10-CM | POA: Insufficient documentation

## 2016-10-05 DIAGNOSIS — C562 Malignant neoplasm of left ovary: Secondary | ICD-10-CM | POA: Diagnosis not present

## 2016-10-05 DIAGNOSIS — N842 Polyp of vagina: Secondary | ICD-10-CM | POA: Diagnosis not present

## 2016-10-05 DIAGNOSIS — M1990 Unspecified osteoarthritis, unspecified site: Secondary | ICD-10-CM | POA: Insufficient documentation

## 2016-10-05 DIAGNOSIS — Z7901 Long term (current) use of anticoagulants: Secondary | ICD-10-CM | POA: Insufficient documentation

## 2016-10-05 DIAGNOSIS — Z853 Personal history of malignant neoplasm of breast: Secondary | ICD-10-CM | POA: Diagnosis not present

## 2016-10-05 DIAGNOSIS — Z7982 Long term (current) use of aspirin: Secondary | ICD-10-CM | POA: Diagnosis not present

## 2016-10-05 DIAGNOSIS — Z9221 Personal history of antineoplastic chemotherapy: Secondary | ICD-10-CM | POA: Insufficient documentation

## 2016-10-05 DIAGNOSIS — I69320 Aphasia following cerebral infarction: Secondary | ICD-10-CM | POA: Insufficient documentation

## 2016-10-05 DIAGNOSIS — Z91040 Latex allergy status: Secondary | ICD-10-CM | POA: Insufficient documentation

## 2016-10-05 DIAGNOSIS — Z8673 Personal history of transient ischemic attack (TIA), and cerebral infarction without residual deficits: Secondary | ICD-10-CM | POA: Diagnosis not present

## 2016-10-05 DIAGNOSIS — C563 Malignant neoplasm of bilateral ovaries: Secondary | ICD-10-CM

## 2016-10-05 DIAGNOSIS — I69351 Hemiplegia and hemiparesis following cerebral infarction affecting right dominant side: Secondary | ICD-10-CM | POA: Insufficient documentation

## 2016-10-05 NOTE — Patient Instructions (Signed)
We will call you with the results of your biopsy from today.  Plan to follow up in January 2018 or sooner if needed.

## 2016-10-06 ENCOUNTER — Encounter: Payer: Self-pay | Admitting: Occupational Therapy

## 2016-10-06 ENCOUNTER — Ambulatory Visit: Payer: Medicare Other | Admitting: Occupational Therapy

## 2016-10-06 DIAGNOSIS — M25611 Stiffness of right shoulder, not elsewhere classified: Secondary | ICD-10-CM | POA: Diagnosis not present

## 2016-10-06 DIAGNOSIS — R2681 Unsteadiness on feet: Secondary | ICD-10-CM | POA: Diagnosis not present

## 2016-10-06 DIAGNOSIS — R41844 Frontal lobe and executive function deficit: Secondary | ICD-10-CM

## 2016-10-06 DIAGNOSIS — M25511 Pain in right shoulder: Secondary | ICD-10-CM | POA: Diagnosis not present

## 2016-10-06 DIAGNOSIS — I69353 Hemiplegia and hemiparesis following cerebral infarction affecting right non-dominant side: Secondary | ICD-10-CM

## 2016-10-06 DIAGNOSIS — G8929 Other chronic pain: Secondary | ICD-10-CM | POA: Diagnosis not present

## 2016-10-06 DIAGNOSIS — R41842 Visuospatial deficit: Secondary | ICD-10-CM | POA: Diagnosis not present

## 2016-10-06 DIAGNOSIS — M6281 Muscle weakness (generalized): Secondary | ICD-10-CM

## 2016-10-06 NOTE — Therapy (Signed)
[facility] [address] [place], [place], [address] Phone: [phone]   Fax:  [phone]  Occupational Therapy Treatment  Patient Details  Name: Brenda Cunningham MRN: [id number] Date of Birth: Oct 30, 1951 Referring Provider: Helane Rima  Encounter Date: 10/06/2016      OT End of Session - 10/06/16 1440    Visit Number 7   Number of Visits 16   Date for OT Re-Evaluation 11/02/16   Authorization Type medicare will need G code and PN every 10th visit   Authorization Time Period 60 days   Authorization - Visit Number 7   Authorization - Number of Visits 10   OT Start Time 1146   OT Stop Time 1228   OT Time Calculation (min) 42 min   Activity Tolerance Patient tolerated treatment well      Past Medical History:  Diagnosis Date  . A-fib ([place])   . Anxiety   . Arthritis   . Breast cancer ([place]) 2008   s/p lumpectomy, chemo and radiation therapy  . Complication of anesthesia    DIFFICULTY WAKING UP FROM ANESTHESIA  . Hyperlipidemia   . Hypertension   . Stroke ([place]) 07/29/2015  . Wears glasses     Past Surgical History:  Procedure Laterality Date  . APPENDECTOMY    . BREAST LUMPECTOMY  2008   left  . COLONOSCOPY  2004  . DEBULKING N/A 03/24/2016   Procedure: RADICAL TUMOR DEBULKING;  Surgeon: Everitt Amber, MD;  Location: [facility];  Service: Gynecology;  Laterality: N/A;  . OMENTECTOMY N/A 03/24/2016   Procedure: OMENTECTOMY;  Surgeon: Everitt Amber, MD;  Location: [facility];  Service: Gynecology;  Laterality: N/A;  . ROBOTIC ASSISTED TOTAL HYSTERECTOMY WITH BILATERAL SALPINGO OOPHERECTOMY Bilateral 03/24/2016   Procedure: XI ROBOTIC ASSISTED TOTA LAPAROSCOPIC  HYSTERECTOMY WITH BILATERAL SALPINGO OOPHORECTOMY;  Surgeon: Everitt Amber, MD;  Location: [facility];  Service: Gynecology;  Laterality: Bilateral;  . TUBAL LIGATION      There were no vitals filed for this visit.      Subjective Assessment - 10/06/16 1153    Subjective  I got a great report from the dr - the tumor is pretty much gone!   Pertinent History see epic snapshot - PT WITH ACTIVE OVARIAN CANCER and cardiomegaly;  high risk for recurrence of stroke.    Patient Stated Goals Walk and my hand - I like being here.  I want to be able to walk in to my dt's house by November.   Currently in Pain? No/denies                      OT Treatments/Exercises (OP) - 10/06/16 0001      Exercises   Exercises Shoulder     Shoulder Exercises: Seated   Other Seated Exercises Arm bike for 8 minutes to address reciprocal movement, shoulder flexion., and activity tolerance. Pt tolerated well however stated she felt "very tired" at end of session.  Pt with no c/o pain with arm bike     Neurological Re-education Exercises   Other Exercises 1 Neuro re ed to address overhead reach in supine bilaterally in closed chain activity.  Pt requires mod vc's to attend to LUE and to use all available isolated volition movement due to L inattention.  Pt with difficulty for sustained activity.       Manual Therapy   Manual Therapy Joint mobilization;Soft tissue mobilization;Myofascial release   Manual therapy comments Joint,soft tissue and scap  mob to decrease tightness and improve alignment prior to NMR to address overhead reach with less pain.                    OT Short Term Goals - 10/06/16 1433      OT SHORT TERM GOAL #1   Title Pt and family will be mod I with HEP - 10/06/2016   Status Achieved     OT SHORT TERM GOAL #2   Title Pt will report no more than 3/10 pain in R shoulder with overhead reach   Status Achieved     OT SHORT TERM GOAL #3   Title Pt will demonstrate 80* of shoulder flexion and abduction in RUE for overhead reach   Status Achieved     OT SHORT TERM GOAL #4   Title Pt will require no more than supervision for static standing balance in prep for functional activity in standing   Status Achieved     OT SHORT TERM  GOAL #5   Title Pt will be mod I with hiking pants 100% of the time.   Status Achieved           OT Long Term Goals - 10/06/16 1433      OT LONG TERM GOAL #1   Title Pt and family will be mod I with upgraded HEP - 11/03/2015   Status On-going     OT LONG TERM GOAL #2   Title Pt will require no more than min a for dynamic standing balance during functional activity   Status On-going     OT LONG TERM GOAL #3   Title Pt will report no than 2/10 pain with overhead reach   Status Achieved     OT LONG TERM GOAL #4   Title Pt will demonstrate 85* of shoulder flexion for overhead reach   Status On-going     OT LONG TERM GOAL #5   Title Pt will demonstrate 80* of abduction for overhead reach   Status On-going     OT LONG TERM GOAL #6   Title Pt will be supevsion only for tub bench transfers   Status Achieved     OT LONG TERM GOAL #7   Title Pt will use RUE as stabilzer with splint during basic ADL tasks prn   Status Achieved     OT LONG TERM GOAL #8   Title Pt will tolerate at least 10 minutes of standing activity without requiring rest break.   Status On-going               Plan - 10/06/16 1439    Clinical Impression Statement Pt progressing with LTG's.  Pt with improving pain free ROM in supine and in sitting.   Rehab Potential Fair   Clinical Impairments Affecting Rehab Potential given severity of deficits, medical status   OT Frequency 2x / week   OT Duration 8 weeks   OT Treatment/Interventions Self-care/ADL training;Electrical Stimulation;Moist Heat;DME and/or AE instruction;Neuromuscular education;Therapeutic exercise;Functional Mobility Training;Manual Therapy;Passive range of motion;Splinting;Therapeutic activities;Balance training;Patient/family education;Cognitive remediation/compensation;Visual/perceptual remediation/compensation   Plan grasp and release with splint, address shoulder pain and manual therapy to increase painfree ROM, NMR fo RUE overhead  reach;  functional use of R hand   Consulted and Agree with Plan of Care Patient      Patient will benefit from skilled therapeutic intervention in order to improve the following deficits and impairments:  Abnormal gait, Cardiopulmonary status limiting activity, Decreased activity tolerance, Decreased balance,  Decreased cognition, Decreased mobility, Decreased range of motion, Decreased safety awareness, Difficulty walking, Decreased strength, Impaired UE functional use, Impaired tone, Impaired sensation, Impaired vision/preception, Pain  Visit Diagnosis: Hemiplegia and hemiparesis following cerebral infarction affecting right non-dominant side (HCC)  Unsteadiness on feet  Muscle weakness (generalized)  Stiffness of right shoulder, not elsewhere classified  Visuospatial deficit  Frontal lobe and executive function deficit    Problem List Patient Active Problem List   Diagnosis Date Noted  . Genetic testing 08/17/2016  . Leukopenia due to antineoplastic chemotherapy ([place]) 06/05/2016  . Costochondritis, acute 05/02/2016  . Ovarian cancer ([place]) 03/24/2016  . Anemia due to antineoplastic chemotherapy 03/17/2016  . Port catheter in place 03/17/2016  . Portacath in place 02/05/2016  . Chemotherapy-induced peripheral neuropathy ([place]) 02/05/2016  . Hematoma 01/27/2016  . Chronic anticoagulation 01/22/2016  . Chronic atrial fibrillation ([place]) 01/22/2016  . PICC (peripherally inserted central catheter) in place 01/22/2016  . Chemotherapy induced neutropenia ([place]) 01/22/2016  . Poor venous access 01/22/2016  . CVA (cerebral vascular accident) ([place]) 12/27/2015  . Right hemiparesis ([place]) 12/27/2015  . Expressive aphasia 12/27/2015  . History of left breast cancer 12/27/2015  . H/O tubal ligation 12/27/2015  . Colon polyps 12/27/2015  . International Federation of Gynecology and Obstetrics (FIGO) stage IVB epithelial ovarian cancer ([place]) 12/27/2015  . Ovarian cancer, bilateral ([place])  12/24/2015  . Inguinal adenopathy   . Carcinomatosis ([place]) 12/06/2015  . Essential hypertension, benign 07/18/2014  . Atrial fibrillation, unspecified 07/18/2014  . Anxiety state, unspecified 07/18/2014    Quay Burow, OTR/L 10/06/2016, 2:42 PM  [facility] [address] [place], [place], [address] Phone: [phone]   Fax:  [phone]  Name: Brenda Cunningham MRN: [id number] Date of Birth: 1951-09-06

## 2016-10-07 ENCOUNTER — Telehealth: Payer: Self-pay

## 2016-10-07 NOTE — Telephone Encounter (Signed)
PAC last flushed 09-30-16. Sent scheduling message to [facility].  See Above copy of Scheduling in basket.

## 2016-10-07 NOTE — Telephone Encounter (Signed)
Orders received from [facility] to contact the patient with her vaginal ,biopsy, left cuff results being "negative for cancer". The patient's daughter Kenney Houseman was contacted and updated with the results. Kenney Houseman states understanding ,denies further questions at this time.

## 2016-10-07 NOTE — Telephone Encounter (Signed)
-----   Message from Gordy Levan, MD sent at 08/19/2016  3:19 PM EDT ----- Does not need to see Dr Marko Plume on 9-14.  She saw Dr Denman George on 8-21, plan to repeat CT 11-10 and see Dr Denman George again on 11-15  Had PAC flush 8-17. Had labs 8-17.  Please cancel LL, lab, flush on 9-14 Please set up lab + flush Oct 12.  She should have PAC used and flushed with CT 11-10, then will need flush again 8 weeks after that.  Thanks.  I have sent scheduling message now also

## 2016-10-07 NOTE — Telephone Encounter (Signed)
Verone, Pari - 10/07/16 << Less Detail',event)" href="javascript:;">More Detail >>    Baruch Merl, RN  Sent: Wed October 07, 2016 12:39 PM  To: P Scheduling Message Pool               Send Date: 10/07/2016                          Priority: Rt -------------------------------------------------------------------------------        Patient: [id number] (DOB: [birth year])           Phone: H [phone]    Appointment Information     Visit Type: FLUSH 50           Dept: [facility]        Provider: Karsten Ro NURSE      Appt Notes: Please schedule for PAC flush week of 11-08-16 and with Dr.                  Denman George appointment on 12-30-16 and every 6-8 weeks from there.                 Thank you LTA -------------------------------------------------------------------------------       Entered By: Johny Sax On: 10/07/16       Entered At: 12:39 PM                                 Entered On: 10/07/16

## 2016-10-09 ENCOUNTER — Ambulatory Visit: Payer: Medicare Other | Attending: Family Medicine | Admitting: Physical Therapy

## 2016-10-09 ENCOUNTER — Encounter: Payer: Self-pay | Admitting: Physical Therapy

## 2016-10-09 ENCOUNTER — Ambulatory Visit: Payer: Medicare Other

## 2016-10-09 DIAGNOSIS — M25511 Pain in right shoulder: Secondary | ICD-10-CM | POA: Insufficient documentation

## 2016-10-09 DIAGNOSIS — M25611 Stiffness of right shoulder, not elsewhere classified: Secondary | ICD-10-CM | POA: Diagnosis not present

## 2016-10-09 DIAGNOSIS — R482 Apraxia: Secondary | ICD-10-CM | POA: Insufficient documentation

## 2016-10-09 DIAGNOSIS — R41844 Frontal lobe and executive function deficit: Secondary | ICD-10-CM | POA: Diagnosis not present

## 2016-10-09 DIAGNOSIS — G8929 Other chronic pain: Secondary | ICD-10-CM | POA: Insufficient documentation

## 2016-10-09 DIAGNOSIS — R2689 Other abnormalities of gait and mobility: Secondary | ICD-10-CM | POA: Insufficient documentation

## 2016-10-09 DIAGNOSIS — R4701 Aphasia: Secondary | ICD-10-CM | POA: Diagnosis not present

## 2016-10-09 DIAGNOSIS — R41842 Visuospatial deficit: Secondary | ICD-10-CM | POA: Insufficient documentation

## 2016-10-09 DIAGNOSIS — M6281 Muscle weakness (generalized): Secondary | ICD-10-CM | POA: Insufficient documentation

## 2016-10-09 DIAGNOSIS — I69353 Hemiplegia and hemiparesis following cerebral infarction affecting right non-dominant side: Secondary | ICD-10-CM | POA: Diagnosis not present

## 2016-10-09 DIAGNOSIS — R2681 Unsteadiness on feet: Secondary | ICD-10-CM | POA: Diagnosis not present

## 2016-10-09 NOTE — Patient Instructions (Signed)
  Please complete the assigned speech therapy homework and return it to your next session.  

## 2016-10-09 NOTE — Therapy (Signed)
[facility] [address] [place], [place], [address] Phone: [phone]   Fax:  [phone]  Speech Language Pathology Treatment  Patient Details  Name: Brenda Cunningham MRN: [id number] Date of Birth: 01-20-1951 Referring Provider: Darra Lis, M.D.  Encounter Date: 10/09/2016      End of Session - 10/09/16 1015    Visit Number 5   Number of Visits 17   Date for SLP Re-Evaluation 12/04/16   SLP Start Time 0933   SLP Stop Time  [id number]   SLP Time Calculation (min) 42 min   Activity Tolerance Patient tolerated treatment well      Past Medical History:  Diagnosis Date  . A-fib ([facility])   . Anxiety   . Arthritis   . Breast cancer ([place]) 2008   s/p lumpectomy, chemo and radiation therapy  . Complication of anesthesia    DIFFICULTY WAKING UP FROM ANESTHESIA  . Hyperlipidemia   . Hypertension   . Stroke ([place]) 07/29/2015  . Wears glasses     Past Surgical History:  Procedure Laterality Date  . APPENDECTOMY    . BREAST LUMPECTOMY  2008   left  . COLONOSCOPY  2004  . DEBULKING N/A 03/24/2016   Procedure: RADICAL TUMOR DEBULKING;  Surgeon: Everitt Amber, MD;  [facility]: [facility];  Service: Gynecology;  Laterality: N/A;  . OMENTECTOMY N/A 03/24/2016   Procedure: OMENTECTOMY;  Surgeon: Everitt Amber, MD;  Location: [facility];  Service: Gynecology;  Laterality: N/A;  . ROBOTIC ASSISTED TOTAL HYSTERECTOMY WITH BILATERAL SALPINGO OOPHERECTOMY Bilateral 03/24/2016   Procedure: XI ROBOTIC ASSISTED TOTA LAPAROSCOPIC  HYSTERECTOMY WITH BILATERAL SALPINGO OOPHORECTOMY;  Surgeon: Everitt Amber, MD;  Location: [facility];  Service: Gynecology;  Laterality: Bilateral;  . TUBAL LIGATION      There were no vitals filed for this visit.      Subjective Assessment - 10/09/16 0939    Subjective "I go too fast."   Currently in Pain? No/denies               ADULT SLP TREATMENT - 10/09/16 0941      General Information   Behavior/Cognition  Alert;Cooperative;Pleasant mood     Treatment Provided   Treatment provided Cognitive-Linquistic     Pain Assessment   Pain Assessment No/denies pain     Cognitive-Linquistic Treatment   Treatment focused on Aphasia   Skilled Treatment As pt began tx today SLP reiterated her best way to improve her communication was to reduce her rate of speech. In sentence tasks, pt req'd usual cues for word choice, with reduced error awareness from pt. Pt with occasional cues necessary to reduce rate with picutre description.      Assessment / Recommendations / Plan   Plan Continue with current plan of care     Progression Toward Goals   Progression toward goals Not progressing toward goals (comment)            SLP Short Term Goals - 10/09/16 1016      SLP SHORT TERM GOAL #1   Title pt will generate appropriate language to participate in functional 10 minutes mod complex converastion with occasional  min A   Time 2   Period Weeks   Status On-going     SLP SHORT TERM GOAL #2   Title pt will generate sentence level responses in structured tasks with 90% success over three sessions    Time 2   Period Weeks   Status On-going     SLP  SHORT TERM GOAL #3   Title pt will demo understanding of 10 minutes mod-complex conversation with rare min A   Baseline 10/23,    Time 2   Period Weeks   Status On-going     SLP SHORT TERM GOAL #4   Title pt will attempt multimodal communication PRN in mod complex conversation   Time 2   Period Weeks   Status On-going     SLP SHORT TERM GOAL #5   Title pt will attempt to use multimodal communicaiton when verbal communication unsuccessful/difficult    Baseline 10/23 gesturing   Time 2   Period Weeks   Status On-going          SLP Long Term Goals - 10/09/16 1017      SLP LONG TERM GOAL #1   Title pt will engage in mod complex-complex conversation functionally with modified independence (multimodal communication, compensations, extra time) over  3 sessions   Time 6   Period Weeks   Status On-going     SLP LONG TERM GOAL #2   Title pt will demo understanding of mod complex-complex conversation with modified independence   Time 6   Period Weeks   Status On-going          Plan - 10/09/16 1015    Clinical Impression Statement Pt with persistent aphasia and dysfluency. Pt able to use compensatory strategies of slower rate and descriptive language with min-mod A. Verbal cues proved helpful for pt's semantic errors during picture description tasks. Skilled ST remains needed to maximize pt's verbal communicaiton.    Speech Therapy Frequency 2x / week   Duration --  8 weeks   Treatment/Interventions Language facilitation;Internal/external aids;SLP instruction and feedback;Multimodal communcation approach;Functional tasks;Patient/family education;Compensatory strategies   Potential to Achieve Goals Good   Potential Considerations Severity of impairments      Patient will benefit from skilled therapeutic intervention in order to improve the following deficits and impairments:   Aphasia    Problem List Patient Active Problem List   Diagnosis Date Noted  . Genetic testing 08/17/2016  . Leukopenia due to antineoplastic chemotherapy ([place]) 06/05/2016  . Costochondritis, acute 05/02/2016  . Ovarian cancer ([place]) 03/24/2016  . Anemia due to antineoplastic chemotherapy 03/17/2016  . Port catheter in place 03/17/2016  . Portacath in place 02/05/2016  . Chemotherapy-induced peripheral neuropathy ([place]) 02/05/2016  . Hematoma 01/27/2016  . Chronic anticoagulation 01/22/2016  . Chronic atrial fibrillation ([place]) 01/22/2016  . PICC (peripherally inserted central catheter) in place 01/22/2016  . Chemotherapy induced neutropenia ([place]) 01/22/2016  . Poor venous access 01/22/2016  . CVA (cerebral vascular accident) ([place]) 12/27/2015  . Right hemiparesis ([place]) 12/27/2015  . Expressive aphasia 12/27/2015  . History of left breast cancer  12/27/2015  . H/O tubal ligation 12/27/2015  . Colon polyps 12/27/2015  . International Federation of Gynecology and Obstetrics (FIGO) stage IVB epithelial ovarian cancer ([place]) 12/27/2015  . Ovarian cancer, bilateral ([place]) 12/24/2015  . Inguinal adenopathy   . Carcinomatosis ([place]) 12/06/2015  . Essential hypertension, benign 07/18/2014  . Atrial fibrillation, unspecified 07/18/2014  . Anxiety state, unspecified 07/18/2014    [facility] ,[place], [place]  10/09/2016, 10:18 AM  [facility] [address] [place], [place], [address] Phone: [phone]   Fax:  [phone]   Name: Brenda Cunningham MRN: [id number] Date of Birth: [birth year]

## 2016-10-09 NOTE — Therapy (Signed)
[facility] [address] [place], [place], [address] Phone: [phone]   Fax:  [phone]  Physical Therapy Treatment  Patient Details  Name: Brenda Cunningham MRN: [id number] Date of Birth: [birth year] Referring Provider: Helane Rima, MD  Encounter Date: 10/09/2016      PT End of Session - 10/09/16 1218    Visit Number 8   Number of Visits 17   Date for PT Re-Evaluation 11/06/16   Authorization Type Medicare Part A and B   Authorization Time Period G-Code every 10th visit   PT Start Time 1018   PT Stop Time 1105   PT Time Calculation (min) 47 min   Equipment Utilized During Treatment Gait belt   Activity Tolerance Patient tolerated treatment well;Patient limited by fatigue   Behavior During Therapy WFL for tasks assessed/performed      Past Medical History:  Diagnosis Date  . A-fib ([place])   . Anxiety   . Arthritis   . Breast cancer ([place]) 2008   s/p lumpectomy, chemo and radiation therapy  . Complication of anesthesia    DIFFICULTY WAKING UP FROM ANESTHESIA  . Hyperlipidemia   . Hypertension   . Stroke ([place]) 07/29/2015  . Wears glasses     Past Surgical History:  Procedure Laterality Date  . APPENDECTOMY    . BREAST LUMPECTOMY  2008   left  . COLONOSCOPY  2004  . DEBULKING N/A 03/24/2016   Procedure: RADICAL TUMOR DEBULKING;  Surgeon: Everitt Amber, MD;  Location: [facility];  Service: Gynecology;  Laterality: N/A;  . OMENTECTOMY N/A 03/24/2016   Procedure: OMENTECTOMY;  Surgeon: Everitt Amber, MD;  Location: [facility];  Service: Gynecology;  Laterality: N/A;  . ROBOTIC ASSISTED TOTAL HYSTERECTOMY WITH BILATERAL SALPINGO OOPHERECTOMY Bilateral 03/24/2016   Procedure: XI ROBOTIC ASSISTED TOTA LAPAROSCOPIC  HYSTERECTOMY WITH BILATERAL SALPINGO OOPHORECTOMY;  Surgeon: Everitt Amber, MD;  Location: [facility];  Service: Gynecology;  Laterality: Bilateral;  . TUBAL LIGATION      There were no vitals filed for this visit.       Subjective Assessment - 10/09/16 1021    Subjective Pt states she has been performing her HEP 3x per week. She had no pain to report.   Limitations Standing;Walking;House hold activities   Patient Stated Goals "I want to get up and see my daughter and get on the train to see my sons in [place]."   Currently in Pain? No/denies   Pain Score 0-No pain           [facility] Adult PT Treatment/Exercise - 10/09/16 1045      Transfers   Transfers Sit to Stand;Stand to Sit   Sit to Stand 4: Min guard;3: Mod assist   Sit to Stand Details Tactile cues for sequencing;Tactile cues for posture;Tactile cues for placement;Verbal cues for sequencing;Verbal cues for technique   Sit to Stand Details (indicate cue type and reason) Pt required mod assist and multiple attempts to stand with out UE support. Cues required to scoot to edge of mat/chair and learn forward.  10 reps of sit to stand performed with UE suppot; Min guard.   Stand to Sit 4: Min guard;With upper extremity assist;With armrests   Stand to Sit Details Verbal cues required to lower with control   Number of Reps 10 reps     Ambulation/Gait   Ambulation/Gait Yes   Ambulation/Gait Assistance 5: Supervision   Ambulation/Gait Assistance Details Verbal and tactile cues on posture; Pt fatigues quickly and needs  seated rest breaks.   Ambulation Distance (Feet) 220 Feet   Assistive device Rolling walker   Gait Pattern Step-through pattern;Decreased stride length;Decreased hip/knee flexion - right;Trunk flexed;Narrow base of support   Ambulation Surface Level;Indoor   Stairs Yes   Stairs Assistance 5: Supervision   Stairs Assistance Details (indicate cue type and reason) Pt ascended/decended 4 steps; using rail on left to ascend and decend to simulate home enviroment.   Stair Management Technique One rail Left   Number of Stairs 4     Lumbar Exercises: Seated   Long Arc Quad on Chair Strengthening;Both;2 sets;10 reps;Weights   LAQ on Chair  Weights (lbs) 2lbs on each LE   LAQ on Chair Limitations On mat table; no back support; VC for postural control and to fully extend knee.   Other Seated Lumbar Exercises Marching while seated on mat; no back support; VC for postural control and breath during activity; 10 reps x2   Other Seated Lumbar Exercises Seated lateral trunk bends to L and R; 20 reps x2 tapping elbows to yoga blocks; progressing to tapping elbows to mat; Demonstrated increased weakness on R by compensating using leg to counter balance; Verbal and tactile cues for technique and to correct compensation.     Knee/Hip Exercises: Standing   Hip Abduction Stengthening;Both;10 reps;1 set   Abduction Limitations Verbal cues to extend knee; Bil UE support; Supervision           PT Education - 10/09/16 1217    Education provided Yes   Education Details Reviewed HEP; explained the importance of completing every day   Person(s) Educated Patient   Methods Explanation   Comprehension Verbalized understanding;Need further instruction          PT Short Term Goals - 10/09/16 1026      PT SHORT TERM GOAL #1   Title Pt will be independent with assistance from caregiver and verbalize understand of initial HEP to further progress toward her goals. (Target Date for all STGs: 10/05/16)   Baseline 10/09/16 PT verbalized understand of HEP and states she performs 3x per week   Time 4   Period Weeks   Status Achieved     PT SHORT TERM GOAL #2   Title Pt will improve Berg Balance score to > or = 26/56 to indicate an improvement in static balance.   Baseline 23/56 on 10/01/16   Time 4   Period Weeks   Status Not Met     PT SHORT TERM GOAL #3   Title Pt will improve TUG time to < or = 32.5 sec indicating an improvemet in functional mobility and balance.   Baseline 32.00 seconds with RW and HO at S level on 10/01/16   Time 4   Period Weeks   Status Achieved     PT SHORT TERM GOAL #4   Title Pt will improve initial gait speed  by 0.6 ft/sec to indicate improvement in functional mobility and functional status.   Baseline 2.44 ft/sec on 10/01/16 (up from 2.03 ft/sec with min/guard)   Time 4   Period Weeks   Status Partially Met     PT SHORT TERM GOAL #5   Title Pt will ambulate 230f with LRAD and supervision indoors over level surfaces including carpet and hard flors to incr ability to safely ambulate at home.   Baseline 10/09/16 Pt ambulated 220 feet before needing a break due to fatigue.   Time 4   Period Weeks   Status Partially Met  PT SHORT TERM GOAL #6   Title Pt will negotiate 4 steps with B UE support and supervision to improve pt's ability to safely enter/exit her house.   Baseline 10/09/16 Patient negociated 4 steps with L UE for support w/ supervision.   Time 4   Period Weeks   Status Achieved           PT Long Term Goals - 09/07/16 1354      PT LONG TERM GOAL #1   Title Pt will be independent and verbalize understanding of HEP an on-going fitness plan to maintain progress made in therapy and improve overall health. (Target Date for all LTGs: 11/02/16)   Time 8   Period Weeks   Status New     PT LONG TERM GOAL #2   Title Pt will improve Berg Balance score to > or = 29/56 to indicate an improvement in static balance and incr safety when performing ADLs.   Time 8   Period Weeks   Status New     PT LONG TERM GOAL #3   Title Pt will improve TUG time to < or = 29.5 sec to indicate an incr in functional mobility and balance when performing ADLs.   Time 8   Period Weeks   Status New     PT LONG TERM GOAL #4   Title Pt will improve gait speed from baseline by 1.2 ft/sec to indicate an incr in functional mobility and safety.   Time 8   Period Weeks   Status New     PT LONG TERM GOAL #5   Title Pt will ambulate 561f with LRAD and supervision outdoors over level and unlevel paved surfaces, ramps, and curbs to incr functional mobility and safety while ambulating in the community.    Time 8   Period Weeks   Status New     Additional Long Term Goals   Additional Long Term Goals Yes     PT LONG TERM GOAL #6   Title Pt will negotiate 8 steps with supervision and unilat UE support to incr safety while entering/exiting family/friend's home.   Time 8   Period Weeks   Status New               Plan - 10/09/16 1219    Clinical Impression Statement Todays skilled session completed assessing STGs. Pt has achieved 3 of 6 STGs. She would benefit from continued PT to address unmet goals.   Rehab Potential Fair   Clinical Impairments Affecting Rehab Potential Chronic stroke status, active ovarian CA, HTN, expressive aphasia,    PT Frequency 2x / week   PT Duration 8 weeks   PT Treatment/Interventions ADLs/Self Care Home Management;Functional mobility training;Stair training;Gait training;DME Instruction;Therapeutic activities;Therapeutic exercise;Balance training;Neuromuscular re-education;Patient/family education;Orthotic Fit/Training;Manual techniques;Energy conservation   PT Next Visit Plan LE strengthening (focus on quads & terminal knee extension) and balance activities that challenge activity tolerance, gait with RW with hand orthotic on compliant surfaces and continue to work on obstacle negotiation/barriers   Consulted and Agree with Plan of Care Patient      Patient will benefit from skilled therapeutic intervention in order to improve the following deficits and impairments:  Abnormal gait, Cardiopulmonary status limiting activity, Decreased activity tolerance, Decreased balance, Decreased mobility, Decreased knowledge of use of DME, Decreased knowledge of precautions, Decreased endurance, Decreased safety awareness, Decreased strength, Impaired perceived functional ability, Impaired flexibility, Improper body mechanics, Impaired UE functional use  Visit Diagnosis: No diagnosis found.  Problem List Patient Active Problem List   Diagnosis Date Noted  .  Genetic testing 08/17/2016  . Leukopenia due to antineoplastic chemotherapy ([place]) 06/05/2016  . Costochondritis, acute 05/02/2016  . Ovarian cancer ([place]) 03/24/2016  . Anemia due to antineoplastic chemotherapy 03/17/2016  . Port catheter in place 03/17/2016  . Portacath in place 02/05/2016  . Chemotherapy-induced peripheral neuropathy ([place]) 02/05/2016  . Hematoma 01/27/2016  . Chronic anticoagulation 01/22/2016  . Chronic atrial fibrillation ([place]) 01/22/2016  . PICC (peripherally inserted central catheter) in place 01/22/2016  . Chemotherapy induced neutropenia ([place]) 01/22/2016  . Poor venous access 01/22/2016  . CVA (cerebral vascular accident) ([place]) 12/27/2015  . Right hemiparesis ([place]) 12/27/2015  . Expressive aphasia 12/27/2015  . History of left breast cancer 12/27/2015  . H/O tubal ligation 12/27/2015  . Colon polyps 12/27/2015  . International Federation of Gynecology and Obstetrics (FIGO) stage IVB epithelial ovarian cancer ([place]) 12/27/2015  . Ovarian cancer, bilateral ([place]) 12/24/2015  . Inguinal adenopathy   . Carcinomatosis ([place]) 12/06/2015  . Essential hypertension, benign 07/18/2014  . Atrial fibrillation, unspecified 07/18/2014  . Anxiety state, unspecified 07/18/2014    Benjiman Core, SPTA 10/09/2016, 12:30 PM  [facility] [address] [place], [place], [address] Phone: [phone]   Fax:  [phone]  Name: Brenda Cunningham MRN: [id number] Date of Birth: Sep 17, 1951

## 2016-10-12 ENCOUNTER — Encounter: Payer: Self-pay | Admitting: Rehabilitation

## 2016-10-12 ENCOUNTER — Ambulatory Visit: Payer: Medicare Other | Admitting: Rehabilitation

## 2016-10-12 ENCOUNTER — Encounter: Payer: Self-pay | Admitting: Occupational Therapy

## 2016-10-12 ENCOUNTER — Telehealth: Payer: Self-pay | Admitting: Oncology

## 2016-10-12 ENCOUNTER — Ambulatory Visit: Payer: Medicare Other | Admitting: *Deleted

## 2016-10-12 ENCOUNTER — Ambulatory Visit: Payer: Medicare Other | Admitting: Occupational Therapy

## 2016-10-12 DIAGNOSIS — M25511 Pain in right shoulder: Secondary | ICD-10-CM

## 2016-10-12 DIAGNOSIS — R2681 Unsteadiness on feet: Secondary | ICD-10-CM | POA: Diagnosis not present

## 2016-10-12 DIAGNOSIS — M6281 Muscle weakness (generalized): Secondary | ICD-10-CM

## 2016-10-12 DIAGNOSIS — I69353 Hemiplegia and hemiparesis following cerebral infarction affecting right non-dominant side: Secondary | ICD-10-CM

## 2016-10-12 DIAGNOSIS — R482 Apraxia: Secondary | ICD-10-CM | POA: Diagnosis not present

## 2016-10-12 DIAGNOSIS — R41844 Frontal lobe and executive function deficit: Secondary | ICD-10-CM

## 2016-10-12 DIAGNOSIS — G8929 Other chronic pain: Secondary | ICD-10-CM

## 2016-10-12 DIAGNOSIS — R2689 Other abnormalities of gait and mobility: Secondary | ICD-10-CM | POA: Diagnosis not present

## 2016-10-12 DIAGNOSIS — R4701 Aphasia: Secondary | ICD-10-CM

## 2016-10-12 DIAGNOSIS — M25611 Stiffness of right shoulder, not elsewhere classified: Secondary | ICD-10-CM

## 2016-10-12 DIAGNOSIS — R41842 Visuospatial deficit: Secondary | ICD-10-CM

## 2016-10-12 NOTE — Patient Instructions (Signed)
Home Exercise Program for your Right arm:  Do these every day, twice a day. Try doing these right after breakfast and then right after dinner as this will help you remember to do them and you will already be sitting at the table.  There should be NO PAIN when you do these!!!  MAKE SURE YOUR SPLINT IS ON WHEN YOU DO THESE EXERCISES!!!  Make sure you are wearing your splint off and on throughout the day as it will make it easier to use your left hand to hold things when the splints is on.  1. Table Slides:  Hold a room temperature water bottle in BOTH hands (you can use you left hand to help hold your right hand on the bottle).  SLOWLY slide the bottle out across the table as far as you can, hold for slow count of 5 and then return to starting position.  Do 10, rest then do 10 more.  GO SLOW!!!  2.  Straightening your elbow. Sit at table and hold an empty water bottle in your right hand.  Bend your elbow and then straighten it, making sure that you hold the bottle with your right hand. This works on Pharmacologist and your hand.  Do 10, rest then do 10 more. MAKE SURE YOU DON"T HIKE YOUR SHOULDER when you do this one.   3.  Work on picking up small light weight objects with your right hand and placing them in a large bowl.  You will need to use your left hand to help get it into your hand and then use your left hand just a little bit to help your right hand let go.  Do at least 10 objects, rest and do 10.    4. USE YOUR RIGHT HAND AS MUCH AS POSSIBLE to hold things.  This will be easier with the splint on.  USE IT, USE IT, USE IT!! The more you use it the better it will get.

## 2016-10-12 NOTE — Therapy (Signed)
[facility] [address] [place], [place], [address] Phone: [phone]   Fax:  [phone]  Occupational Therapy Treatment  Patient Details  Name: Brenda Cunningham MRN: [id number] Date of Birth: 05/27/1951 Referring Provider: Helane Rima  Encounter Date: 10/12/2016      OT End of Session - 10/12/16 1146    Visit Number 8   Number of Visits 16   Date for OT Re-Evaluation 11/02/16   Authorization Type medicare will need G code and PN every 10th visit   Authorization Time Period 60 days   Authorization - Visit Number 8   Authorization - Number of Visits 10   OT Start Time 1103   OT Stop Time 1145   OT Time Calculation (min) 42 min   Activity Tolerance Patient tolerated treatment well      Past Medical History:  Diagnosis Date  . A-fib ([place])   . Anxiety   . Arthritis   . Breast cancer ([place]) 2008   s/p lumpectomy, chemo and radiation therapy  . Complication of anesthesia    DIFFICULTY WAKING UP FROM ANESTHESIA  . Hyperlipidemia   . Hypertension   . Stroke ([place]) 07/29/2015  . Wears glasses     Past Surgical History:  Procedure Laterality Date  . APPENDECTOMY    . BREAST LUMPECTOMY  2008   left  . COLONOSCOPY  2004  . DEBULKING N/A 03/24/2016   Procedure: RADICAL TUMOR DEBULKING;  Surgeon: Everitt Amber, MD;  [facility]: [facility];  Service: Gynecology;  Laterality: N/A;  . OMENTECTOMY N/A 03/24/2016   Procedure: OMENTECTOMY;  Surgeon: Everitt Amber, MD;  Location: [facility];  Service: Gynecology;  Laterality: N/A;  . ROBOTIC ASSISTED TOTAL HYSTERECTOMY WITH BILATERAL SALPINGO OOPHERECTOMY Bilateral 03/24/2016   Procedure: XI ROBOTIC ASSISTED TOTA LAPAROSCOPIC  HYSTERECTOMY WITH BILATERAL SALPINGO OOPHORECTOMY;  Surgeon: Everitt Amber, MD;  Location: [facility];  Service: Gynecology;  Laterality: Bilateral;  . TUBAL LIGATION      There were no vitals filed for this visit.      Subjective Assessment - 10/12/16 1110    Subjective  PT walked my legs off today   Pertinent History see epic snapshot - PT WITH ACTIVE OVARIAN CANCER and cardiomegaly;  high risk for recurrence of stroke.    Patient Stated Goals Walk and my hand - I like being here.  I want to be able to walk in to my dt's house by November.   Currently in Pain? Yes   Pain Score 4    Pain Location Abdomen   Pain Orientation Lower   Pain Descriptors / Indicators Aching   Pain Onset Yesterday   Pain Frequency Intermittent   Aggravating Factors  not having bowel movement   Pain Relieving Factors laxative                      OT Treatments/Exercises (OP) - 10/12/16 0001      Neurological Re-education Exercises   Other Exercises 1 Neuro re ed to address incorporating activities into HEP to encourage functional use of RUE as well as enourage full shoulder flexion to decrease tightness. Pt able to return demonstrate all activities. Pt given activities in writing and will show son who can assist prn. See pt instruction section for details.                 OT Education - 10/12/16 1145    Education provided Yes   Education Details HEP for RUE  Person(s) Educated Patient   Methods Explanation;Demonstration;Verbal cues;Handout   Comprehension Verbalized understanding;Returned demonstration          OT Short Term Goals - 10/12/16 1145      OT SHORT TERM GOAL #1   Title Pt and family will be mod I with HEP - 10/06/2016   Status Achieved     OT SHORT TERM GOAL #2   Title Pt will report no more than 3/10 pain in R shoulder with overhead reach   Status Achieved     OT SHORT TERM GOAL #3   Title Pt will demonstrate 80* of shoulder flexion and abduction in RUE for overhead reach   Status Achieved     OT SHORT TERM GOAL #4   Title Pt will require no more than supervision for static standing balance in prep for functional activity in standing   Status Achieved     OT SHORT TERM GOAL #5   Title Pt will be mod I with  hiking pants 100% of the time.   Status Achieved           OT Long Term Goals - 10/12/16 1145      OT LONG TERM GOAL #1   Title Pt and family will be mod I with upgraded HEP - 11/03/2015   Status On-going     OT LONG TERM GOAL #2   Title Pt will require no more than min a for dynamic standing balance during functional activity   Status Achieved     OT LONG TERM GOAL #3   Title Pt will report no than 2/10 pain with overhead reach   Status Achieved     OT LONG TERM GOAL #4   Title Pt will demonstrate 85* of shoulder flexion for overhead reach   Status Achieved  92*     OT LONG TERM GOAL #5   Title Pt will demonstrate 80* of abduction for overhead reach   Status Achieved  83*     OT LONG TERM GOAL #6   Title Pt will be supevsion only for tub bench transfers   Status Achieved     OT LONG TERM GOAL #7   Title Pt will use RUE as stabilzer with splint during basic ADL tasks prn   Status Achieved     OT LONG TERM GOAL #8   Title Pt will tolerate at least 10 minutes of standing activity without requiring rest break.   Status Achieved               Plan - 10/12/16 1146    Clinical Impression Statement Pt has met all but one LTG. Pt issued upgraded HEP and will review and modify as needed next session.   Rehab Potential Fair   Clinical Impairments Affecting Rehab Potential given severity of deficits, medical status   OT Frequency 2x / week   OT Duration 8 weeks   OT Treatment/Interventions Self-care/ADL training;Electrical Stimulation;Moist Heat;DME and/or AE instruction;Neuromuscular education;Therapeutic exercise;Functional Mobility Training;Manual Therapy;Passive range of motion;Splinting;Therapeutic activities;Balance training;Patient/family education;Cognitive remediation/compensation;Visual/perceptual remediation/compensation   Plan check HEP, modify as necessary, D/c from OT   Consulted and Agree with Plan of Care Patient      Patient will benefit from  skilled therapeutic intervention in order to improve the following deficits and impairments:  Abnormal gait, Cardiopulmonary status limiting activity, Decreased activity tolerance, Decreased balance, Decreased cognition, Decreased mobility, Decreased range of motion, Decreased safety awareness, Difficulty walking, Decreased strength, Impaired UE functional use, Impaired tone, Impaired sensation,  Impaired vision/preception, Pain  Visit Diagnosis: Hemiplegia and hemiparesis following cerebral infarction affecting right non-dominant side (HCC)  Unsteadiness on feet  Muscle weakness (generalized)  Stiffness of right shoulder, not elsewhere classified  Visuospatial deficit  Frontal lobe and executive function deficit  Chronic right shoulder pain    Problem List Patient Active Problem List   Diagnosis Date Noted  . Genetic testing 08/17/2016  . Leukopenia due to antineoplastic chemotherapy ([place]) 06/05/2016  . Costochondritis, acute 05/02/2016  . Ovarian cancer ([place]) 03/24/2016  . Anemia due to antineoplastic chemotherapy 03/17/2016  . Port catheter in place 03/17/2016  . Portacath in place 02/05/2016  . Chemotherapy-induced peripheral neuropathy ([place]) 02/05/2016  . Hematoma 01/27/2016  . Chronic anticoagulation 01/22/2016  . Chronic atrial fibrillation ([place]) 01/22/2016  . PICC (peripherally inserted central catheter) in place 01/22/2016  . Chemotherapy induced neutropenia ([place]) 01/22/2016  . Poor venous access 01/22/2016  . CVA (cerebral vascular accident) ([place]) 12/27/2015  . Right hemiparesis ([place]) 12/27/2015  . Expressive aphasia 12/27/2015  . History of left breast cancer 12/27/2015  . H/O tubal ligation 12/27/2015  . Colon polyps 12/27/2015  . International Federation of Gynecology and Obstetrics (FIGO) stage IVB epithelial ovarian cancer ([place]) 12/27/2015  . Ovarian cancer, bilateral ([place]) 12/24/2015  . Inguinal adenopathy   . Carcinomatosis ([place]) 12/06/2015  . Essential  hypertension, benign 07/18/2014  . Atrial fibrillation, unspecified 07/18/2014  . Anxiety state, unspecified 07/18/2014    Quay Burow, OTR/L 10/12/2016, 11:48 AM  [facility] [address] [place], [place], [address] Phone: [phone]   Fax:  [phone]  Name: Tomma Ehinger MRN: [id number] Date of Birth: 02/11/1951

## 2016-10-12 NOTE — Therapy (Signed)
[facility] [address] [place], [place], [address] Phone: [phone]   Fax:  [phone]  Physical Therapy Treatment  Patient Details  Name: Brenda Cunningham MRN: [id number] Date of Birth: 09-12-1951 Referring Provider: Helane Rima, MD  Encounter Date: 10/12/2016      PT End of Session - 10/12/16 0939    Visit Number 9   Number of Visits 17   Date for PT Re-Evaluation 11/06/16   Authorization Type Medicare Part A and B   Authorization Time Period G-Code every 10th visit   PT Start Time 0932   PT Stop Time 1015   PT Time Calculation (min) 43 min   Equipment Utilized During Treatment Gait belt   Activity Tolerance Patient tolerated treatment well;Patient limited by fatigue   Behavior During Therapy WFL for tasks assessed/performed      Past Medical History:  Diagnosis Date  . A-fib ([place])   . Anxiety   . Arthritis   . Breast cancer ([place]) 2008   s/p lumpectomy, chemo and radiation therapy  . Complication of anesthesia    DIFFICULTY WAKING UP FROM ANESTHESIA  . Hyperlipidemia   . Hypertension   . Stroke ([place]) 07/29/2015  . Wears glasses     Past Surgical History:  Procedure Laterality Date  . APPENDECTOMY    . BREAST LUMPECTOMY  2008   left  . COLONOSCOPY  2004  . DEBULKING N/A 03/24/2016   Procedure: RADICAL TUMOR DEBULKING;  Surgeon: Everitt Amber, MD;  Location: [facility];  Service: Gynecology;  Laterality: N/A;  . OMENTECTOMY N/A 03/24/2016   Procedure: OMENTECTOMY;  Surgeon: Everitt Amber, MD;  Location: [facility];  Service: Gynecology;  Laterality: N/A;  . ROBOTIC ASSISTED TOTAL HYSTERECTOMY WITH BILATERAL SALPINGO OOPHERECTOMY Bilateral 03/24/2016   Procedure: XI ROBOTIC ASSISTED TOTA LAPAROSCOPIC  HYSTERECTOMY WITH BILATERAL SALPINGO OOPHORECTOMY;  Surgeon: Everitt Amber, MD;  Location: [facility];  Service: Gynecology;  Laterality: Bilateral;  . TUBAL LIGATION      There were no vitals filed for this visit.       Subjective Assessment - 10/12/16 0937    Subjective "I've had a lot of stomach pain, I have to take things to help me go to the bathroom.     Limitations Standing;Walking;House hold activities   Patient Stated Goals "I want to get up and see my daughter and get on the train to see my sons in [place]."   Currently in Pain? Yes   Pain Score 4    Pain Location Abdomen   Pain Orientation Lower   Pain Descriptors / Indicators Aching   Pain Type Acute pain   Pain Onset Yesterday   Pain Frequency Intermittent   Aggravating Factors  not having bowel movement   Pain Relieving Factors laxative.                          [facility] Adult PT Treatment/Exercise - 10/12/16 0955      Transfers   Transfers Sit to Stand;Stand to Sit   Sit to Stand 5: Supervision   Sit to Stand Details Verbal cues for sequencing;Verbal cues for technique;Verbal cues for precautions/safety   Sit to Stand Details (indicate cue type and reason) Performed sit<>stand x 7 reps from mat with hands on lap for improved BLE strength and midline orientation.   Cues for posture when upright and decreased L genu recurvatum.     Stand to Sit 5: Supervision   Stand  to Sit Details (indicate cue type and reason) Verbal cues for precautions/safety;Verbal cues for safe use of DME/AE   Stand to Sit Details Pt doing much better with controlled descent during session.      Ambulation/Gait   Ambulation/Gait Yes   Ambulation/Gait Assistance 5: Supervision   Ambulation/Gait Assistance Details Assessed gait over unlevel outdoor paved surfaces x 500' at S level with RW and R HO.  Note requires min cues for forward gaze, however did very well.  Encouraged her to walk outdoors with S on paved surfaces when weather permits.     Ambulation Distance (Feet) 500 Feet   Assistive device Rolling walker   Gait Pattern Step-through pattern;Decreased stride length;Decreased hip/knee flexion - right;Trunk flexed;Narrow base of support    Ambulation Surface Level;Unlevel;Indoor;Outdoor;Paved   Stairs Yes   Stairs Assistance 5: Supervision;4: Min guard   Stairs Assistance Details (indicate cue type and reason) Performed x 3 reps first ascending in step to fashion then altnerating, however note when descending, requires min/guard and note instability, therefore recommend she descend using step to pattern for safety.  Pt verbalized understanding .   Stair Management Technique One rail Left   Number of Stairs 4  x 3 reps   Height of Stairs 6   Ramp 5: Supervision   Ramp Details (indicate cue type and reason) S for safety   Curb 5: Supervision   Curb Details (indicate cue type and reason) s for safety     Exercises   Exercises Other Exercises   Other Exercises  sit<>stand from 14" step (and airdex foam pad placed on top) x 5 reps with min to mod A ( to simulate couch at home), seated nautilus leg press x 10 reps with BLEs and 50lbs with cues for controlled movement and decreased knee extension at end of ROM for improved control.                  PT Education - 10/12/16 [phone]    Education provided Yes   Education Details Cues for increasing activity at home   Person(s) Educated Patient   Methods Explanation   Comprehension Verbalized understanding          PT Short Term Goals - 10/09/16 1026      PT SHORT TERM GOAL #1   Title Pt will be independent with assistance from caregiver and verbalize understand of initial HEP to further progress toward her goals. (Target Date for all STGs: 10/05/16)   Baseline 10/09/16 PT verbalized understand of HEP and states she performs 3x per week   Time 4   Period Weeks   Status Achieved     PT SHORT TERM GOAL #2   Title Pt will improve Berg Balance score to > or = 26/56 to indicate an improvement in static balance.   Baseline 23/56 on 10/01/16   Time 4   Period Weeks   Status Not Met     PT SHORT TERM GOAL #3   Title Pt will improve TUG time to < or = 32.5 sec indicating  an improvemet in functional mobility and balance.   Baseline 32.00 seconds with RW and HO at S level on 10/01/16   Time 4   Period Weeks   Status Achieved     PT SHORT TERM GOAL #4   Title Pt will improve initial gait speed by 0.6 ft/sec to indicate improvement in functional mobility and functional status.   Baseline 2.44 ft/sec on 10/01/16 (up from 2.03 ft/sec  with min/guard)   Time 4   Period Weeks   Status Partially Met     PT SHORT TERM GOAL #5   Title Pt will ambulate 232f with LRAD and supervision indoors over level surfaces including carpet and hard flors to incr ability to safely ambulate at home.   Baseline 10/09/16 Pt ambulated 220 feet before needing a break due to fatigue.   Time 4   Period Weeks   Status Partially Met     PT SHORT TERM GOAL #6   Title Pt will negotiate 4 steps with B UE support and supervision to improve pt's ability to safely enter/exit her house.   Baseline 10/09/16 Patient negociated 4 steps with L UE for support w/ supervision.   Time 4   Period Weeks   Status Achieved           PT Long Term Goals - 09/07/16 1354      PT LONG TERM GOAL #1   Title Pt will be independent and verbalize understanding of HEP an on-going fitness plan to maintain progress made in therapy and improve overall health. (Target Date for all LTGs: 11/02/16)   Time 8   Period Weeks   Status New     PT LONG TERM GOAL #2   Title Pt will improve Berg Balance score to > or = 29/56 to indicate an improvement in static balance and incr safety when performing ADLs.   Time 8   Period Weeks   Status New     PT LONG TERM GOAL #3   Title Pt will improve TUG time to < or = 29.5 sec to indicate an incr in functional mobility and balance when performing ADLs.   Time 8   Period Weeks   Status New     PT LONG TERM GOAL #4   Title Pt will improve gait speed from baseline by 1.2 ft/sec to indicate an incr in functional mobility and safety.   Time 8   Period Weeks   Status  New     PT LONG TERM GOAL #5   Title Pt will ambulate 5038fwith LRAD and supervision outdoors over level and unlevel paved surfaces, ramps, and curbs to incr functional mobility and safety while ambulating in the community.   Time 8   Period Weeks   Status New     Additional Long Term Goals   Additional Long Term Goals Yes     PT LONG TERM GOAL #6   Title Pt will negotiate 8 steps with supervision and unilat UE support to incr safety while entering/exiting family/friend's home.   Time 8   Period Weeks   Status New               Plan - 10/12/16 0940    Clinical Impression Statement Skilled session focused on gait over paved outdoor surfaces for LTG.  Note she is able to perform 500' at S level, also continue to work on BLLemmonuring session.  Education on increasing activity at home and walking program to increase endurance.    Rehab Potential Fair   Clinical Impairments Affecting Rehab Potential Chronic stroke status, active ovarian CA, HTN, expressive aphasia,    PT Frequency 2x / week   PT Duration 8 weeks   PT Treatment/Interventions ADLs/Self Care Home Management;Functional mobility training;Stair training;Gait training;DME Instruction;Therapeutic activities;Therapeutic exercise;Balance training;Neuromuscular re-education;Patient/family education;Orthotic Fit/Training;Manual techniques;Energy conservation   PT Next Visit Plan Look to D/C early if she is meeting goals, PN/GCODE!!  LE strengthening (focus on quads & terminal knee extension) and balance activities that challenge activity tolerance, gait with RW with hand orthotic on compliant surfaces and continue to work on obstacle negotiation/barriers   Consulted and Agree with Plan of Care Patient      Patient will benefit from skilled therapeutic intervention in order to improve the following deficits and impairments:  Abnormal gait, Cardiopulmonary status limiting activity, Decreased activity tolerance,  Decreased balance, Decreased mobility, Decreased knowledge of use of DME, Decreased knowledge of precautions, Decreased endurance, Decreased safety awareness, Decreased strength, Impaired perceived functional ability, Impaired flexibility, Improper body mechanics, Impaired UE functional use  Visit Diagnosis: Hemiplegia and hemiparesis following cerebral infarction affecting right non-dominant side (HCC)  Unsteadiness on feet  Muscle weakness (generalized)  Other abnormalities of gait and mobility     Problem List Patient Active Problem List   Diagnosis Date Noted  . Genetic testing 08/17/2016  . Leukopenia due to antineoplastic chemotherapy ([place]) 06/05/2016  . Costochondritis, acute 05/02/2016  . Ovarian cancer ([place]) 03/24/2016  . Anemia due to antineoplastic chemotherapy 03/17/2016  . Port catheter in place 03/17/2016  . Portacath in place 02/05/2016  . Chemotherapy-induced peripheral neuropathy ([place]) 02/05/2016  . Hematoma 01/27/2016  . Chronic anticoagulation 01/22/2016  . Chronic atrial fibrillation ([place]) 01/22/2016  . PICC (peripherally inserted central catheter) in place 01/22/2016  . Chemotherapy induced neutropenia ([place]) 01/22/2016  . Poor venous access 01/22/2016  . CVA (cerebral vascular accident) ([place]) 12/27/2015  . Right hemiparesis ([place]) 12/27/2015  . Expressive aphasia 12/27/2015  . History of left breast cancer 12/27/2015  . H/O tubal ligation 12/27/2015  . Colon polyps 12/27/2015  . International Federation of Gynecology and Obstetrics (FIGO) stage IVB epithelial ovarian cancer ([place]) 12/27/2015  . Ovarian cancer, bilateral ([place]) 12/24/2015  . Inguinal adenopathy   . Carcinomatosis ([place]) 12/06/2015  . Essential hypertension, benign 07/18/2014  . Atrial fibrillation, unspecified 07/18/2014  . Anxiety state, unspecified 07/18/2014    Cameron Sprang, PT, MPT [facility] [address] [place], [place],  [address] Phone: [phone]   Fax:  [phone] 10/12/16, 1:03 PM  Name: Larena Ohnemus MRN: [id number] Date of Birth: 13-Jun-1951

## 2016-10-12 NOTE — Therapy (Signed)
[facility] [address] [place], [place], [address] Phone: [phone]   Fax:  [phone]  Speech Language Pathology Treatment  Patient Details  Name: Brenda Cunningham MRN: [id number] Date of Birth: March 16, 1951 Referring Provider: Darra Lis, M.D.  Encounter Date: 10/12/2016      End of Session - 10/12/16 1047    Visit Number 6   Number of Visits 17   Date for SLP Re-Evaluation 12/04/16   SLP Start Time [id number]   SLP Stop Time  1100   SLP Time Calculation (min) 45 min   Activity Tolerance Patient tolerated treatment well      Past Medical History:  Diagnosis Date  . A-fib ([place])   . Anxiety   . Arthritis   . Breast cancer ([place]) 2008   s/p lumpectomy, chemo and radiation therapy  . Complication of anesthesia    DIFFICULTY WAKING UP FROM ANESTHESIA  . Hyperlipidemia   . Hypertension   . Stroke ([place]) 07/29/2015  . Wears glasses     Past Surgical History:  Procedure Laterality Date  . APPENDECTOMY    . BREAST LUMPECTOMY  2008   left  . COLONOSCOPY  2004  . DEBULKING N/A 03/24/2016   Procedure: RADICAL TUMOR DEBULKING;  Surgeon: Everitt Amber, MD;  [facility];  Service: Gynecology;  Laterality: N/A;  . OMENTECTOMY N/A 03/24/2016   Procedure: OMENTECTOMY;  Surgeon: Everitt Amber, MD;  [facility];  Service: Gynecology;  Laterality: N/A;  . ROBOTIC ASSISTED TOTAL HYSTERECTOMY WITH BILATERAL SALPINGO OOPHERECTOMY Bilateral 03/24/2016   Procedure: XI ROBOTIC ASSISTED TOTA LAPAROSCOPIC  HYSTERECTOMY WITH BILATERAL SALPINGO OOPHORECTOMY;  Surgeon: Everitt Amber, MD;  [facility];  Service: Gynecology;  Laterality: Bilateral;  . TUBAL LIGATION      There were no vitals filed for this visit.      Subjective Assessment - 10/12/16 1035    Subjective "I'm doing good."               ADULT SLP TREATMENT - 10/12/16 1036      General Information   Behavior/Cognition Alert;Cooperative;Pleasant mood      Treatment Provided   Treatment provided Cognitive-Linquistic     Pain Assessment   Pain Assessment No/denies pain     Cognitive-Linquistic Treatment   Treatment focused on Aphasia   Skilled Treatment Pt much more conversant this date and providing conversational speech re: details of her medical history. Able to use descriptive language when encountering word-finding difficulty in conversational speech with min verbal cueing. Max A for assist with grammatical sentence completion.      Assessment / Recommendations / Plan   Plan Continue with current plan of care     Progression Toward Goals   Progression toward goals Progressing toward goals            SLP Short Term Goals - 10/12/16 1050      SLP SHORT TERM GOAL #1   Title pt will generate appropriate language to participate in functional 10 minutes mod complex converastion with occasional  min A   Status On-going     SLP SHORT TERM GOAL #2   Title pt will generate sentence level responses in structured tasks with 90% success over three sessions      SLP SHORT TERM GOAL #3   Title pt will demo understanding of 10 minutes mod-complex conversation with rare min A   Baseline 10/23, 11,6   Status Achieved     SLP SHORT TERM GOAL #  4   Title pt will attempt multimodal communication PRN in mod complex conversation   Status On-going     SLP SHORT TERM GOAL #5   Title pt will attempt to use multimodal communicaiton when verbal communication unsuccessful/difficult    Status On-going          SLP Long Term Goals - 10/09/16 1017      SLP LONG TERM GOAL #1   Title pt will engage in mod complex-complex conversation functionally with modified independence (multimodal communication, compensations, extra time) over 3 sessions   Time 6   Period Weeks   Status On-going     SLP LONG TERM GOAL #2   Title pt will demo understanding of mod complex-complex conversation with modified independence   Time 6   Period Weeks    Status On-going          Plan - 10/12/16 1048    Clinical Impression Statement Pt with persistent aphasia and dysfluency. Pt able to use compensatory strategies of slower rate and descriptive language with min-mod A. Significant improvements noted this date. Skilled ST remains needed to maximize pt's verbal communicaiton.    Speech Therapy Frequency 2x / week   Treatment/Interventions Language facilitation;Internal/external aids;SLP instruction and feedback;Multimodal communcation approach;Functional tasks;Patient/family education;Compensatory strategies   Potential to Achieve Goals Good   Potential Considerations Severity of impairments      Patient will benefit from skilled therapeutic intervention in order to improve the following deficits and impairments:   Aphasia    Problem List Patient Active Problem List   Diagnosis Date Noted  . Genetic testing 08/17/2016  . Leukopenia due to antineoplastic chemotherapy ([place]) 06/05/2016  . Costochondritis, acute 05/02/2016  . Ovarian cancer ([place]) 03/24/2016  . Anemia due to antineoplastic chemotherapy 03/17/2016  . Port catheter in place 03/17/2016  . Portacath in place 02/05/2016  . Chemotherapy-induced peripheral neuropathy ([place]) 02/05/2016  . Hematoma 01/27/2016  . Chronic anticoagulation 01/22/2016  . Chronic atrial fibrillation ([place]) 01/22/2016  . PICC (peripherally inserted central catheter) in place 01/22/2016  . Chemotherapy induced neutropenia ([place]) 01/22/2016  . Poor venous access 01/22/2016  . CVA (cerebral vascular accident) ([place]) 12/27/2015  . Right hemiparesis ([place]) 12/27/2015  . Expressive aphasia 12/27/2015  . History of left breast cancer 12/27/2015  . H/O tubal ligation 12/27/2015  . Colon polyps 12/27/2015  . International Federation of Gynecology and Obstetrics (FIGO) stage IVB epithelial ovarian cancer ([place]) 12/27/2015  . Ovarian cancer, bilateral ([place]) 12/24/2015  . Inguinal adenopathy   . Carcinomatosis  ([place]) 12/06/2015  . Essential hypertension, benign 07/18/2014  . Atrial fibrillation, unspecified 07/18/2014  . Anxiety state, unspecified 07/18/2014    Vinetta Bergamo MA, CCC-SLP 10/12/2016, 10:58 AM  [facility] [address] [place], [place], [address] Phone: [phone]   Fax:  [phone]   Name: Brenda Cunningham MRN: [id number] Date of Birth: 1951/07/11

## 2016-10-12 NOTE — Telephone Encounter (Signed)
Confirmed pt flush appt with dtr per LOS

## 2016-10-15 ENCOUNTER — Encounter: Payer: Self-pay | Admitting: Rehabilitation

## 2016-10-15 ENCOUNTER — Ambulatory Visit: Payer: Medicare Other

## 2016-10-15 ENCOUNTER — Ambulatory Visit: Payer: Medicare Other | Admitting: Rehabilitation

## 2016-10-15 ENCOUNTER — Encounter: Payer: Self-pay | Admitting: Occupational Therapy

## 2016-10-15 ENCOUNTER — Ambulatory Visit: Payer: Medicare Other | Admitting: Occupational Therapy

## 2016-10-15 DIAGNOSIS — R2681 Unsteadiness on feet: Secondary | ICD-10-CM | POA: Diagnosis not present

## 2016-10-15 DIAGNOSIS — R482 Apraxia: Secondary | ICD-10-CM | POA: Diagnosis not present

## 2016-10-15 DIAGNOSIS — M6281 Muscle weakness (generalized): Secondary | ICD-10-CM | POA: Diagnosis not present

## 2016-10-15 DIAGNOSIS — R4701 Aphasia: Secondary | ICD-10-CM | POA: Diagnosis not present

## 2016-10-15 DIAGNOSIS — I69353 Hemiplegia and hemiparesis following cerebral infarction affecting right non-dominant side: Secondary | ICD-10-CM

## 2016-10-15 DIAGNOSIS — R41844 Frontal lobe and executive function deficit: Secondary | ICD-10-CM

## 2016-10-15 DIAGNOSIS — R41842 Visuospatial deficit: Secondary | ICD-10-CM

## 2016-10-15 DIAGNOSIS — M25611 Stiffness of right shoulder, not elsewhere classified: Secondary | ICD-10-CM

## 2016-10-15 DIAGNOSIS — M25511 Pain in right shoulder: Secondary | ICD-10-CM

## 2016-10-15 DIAGNOSIS — G8929 Other chronic pain: Secondary | ICD-10-CM

## 2016-10-15 DIAGNOSIS — R2689 Other abnormalities of gait and mobility: Secondary | ICD-10-CM | POA: Diagnosis not present

## 2016-10-15 NOTE — Therapy (Signed)
[facility] [address] [place], [place], [address] Phone: [phone]   Fax:  [phone]  Physical Therapy Treatment  Patient Details  Name: Brenda Cunningham MRN: [id number] Date of Birth: [birth year] Referring Provider: Helane Rima, MD  Encounter Date: 10/15/2016      PT End of Session - 10/15/16 1451    Visit Number 10   Number of Visits 17   Date for PT Re-Evaluation 11/06/16   Authorization Type Medicare Part A and B   Authorization Time Period G-Code every 10th visit   PT Start Time 1445   PT Stop Time 1531   PT Time Calculation (min) 46 min   Equipment Utilized During Treatment Gait belt   Activity Tolerance Patient tolerated treatment well;Patient limited by fatigue   Behavior During Therapy WFL for tasks assessed/performed      Past Medical History:  Diagnosis Date  . A-fib ([place])   . Anxiety   . Arthritis   . Breast cancer ([place]) 2008   s/p lumpectomy, chemo and radiation therapy  . Complication of anesthesia    DIFFICULTY WAKING UP FROM ANESTHESIA  . Hyperlipidemia   . Hypertension   . Stroke ([place]) 07/29/2015  . Wears glasses     Past Surgical History:  Procedure Laterality Date  . APPENDECTOMY    . BREAST LUMPECTOMY  2008   left  . COLONOSCOPY  2004  . DEBULKING N/A 03/24/2016   Procedure: RADICAL TUMOR DEBULKING;  Surgeon: Everitt Amber, MD;  Location: [facility];  Service: Gynecology;  Laterality: N/A;  . OMENTECTOMY N/A 03/24/2016   Procedure: OMENTECTOMY;  Surgeon: Everitt Amber, MD;  Location: [facility];  Service: Gynecology;  Laterality: N/A;  . ROBOTIC ASSISTED TOTAL HYSTERECTOMY WITH BILATERAL SALPINGO OOPHERECTOMY Bilateral 03/24/2016   Procedure: XI ROBOTIC ASSISTED TOTA LAPAROSCOPIC  HYSTERECTOMY WITH BILATERAL SALPINGO OOPHORECTOMY;  Surgeon: Everitt Amber, MD;  Location: [facility];  Service: Gynecology;  Laterality: Bilateral;  . TUBAL LIGATION      There were no vitals filed for this visit.      Subjective Assessment - 10/15/16 1450    Subjective I still have pain in my stomach, but they say its a muscle   Limitations Standing;Walking;House hold activities   Patient Stated Goals "I want to get up and see my daughter and get on the train to see my sons in [place]."   Currently in Pain? Yes   Pain Score 2    Pain Location Abdomen   Pain Orientation Left   Pain Descriptors / Indicators Aching   Pain Type Acute pain   Pain Onset In the past 7 days   Pain Frequency Intermittent   Aggravating Factors  I don't  know   Pain Relieving Factors I don't know                         [facility] Adult PT Treatment/Exercise - 10/15/16 0001      Transfers   Transfers Sit to Stand;Stand to Sit   Comments Continue to work on sit<>stand for [employer]; performing from elevated mat with RUE placed on arm rest in front of her for WB and midline orientation.  Note pt unable to achieve full benefit of standing, therefore had her perform with PT holding pts BUEs.  Note pt pulling through UEs, therefore had her hold her own RUE and stand without support.  Note marked improvement, therefore lowered mat during session.  Also progressed to lower mat  to further challenge pt.  note that she tends to lock LLE in extension and keep R LE flexed in stance, therefore worked on this during sit<>stand as well.  Progressed to performing with LLE placed on 4" step.  Requires mod to max A to stand in this manner, however did note improved activation of RLE.  Pt notably fatigued during activities and needed increaed rest breaks.       Ambulation/Gait   Stairs Yes   Stairs Assistance 5: Supervision   Stairs Assistance Details (indicate cue type and reason) cues for alternating when ascending and step to shen descending.     Stair Management Technique One rail Left   Number of Stairs 4   Height of Stairs 6     Neuro Re-ed    Neuro Re-ed Details  Performed balance activities in // bars; standing on foam  balance beam with single UE support x 30 secs, without UE support x 2 reps of 30 secs.  Note pt continues to increase weight shift to the L, therefore had pt turn to face mirror during tasks with marked improvement.  Performed stepping task over foam balance beam x 10 reps with RLE and aso with LLE to increase forward weight shift and placement as well as improved R LE activation during L stepping.  wall bumps x 10 reps with feet approx 5" away from wall with intermittent min/guard for safety.   standing L hip abduction with emphasis on activation of RLE.  Facilitation at pelvis for increased protraction and also at R knee to ensure increased quad activation.                  PT Education - 10/15/16 1644    Education provided Yes   Education Details Education on importance of RLE WB   Person(s) Educated Patient   Methods Explanation   Comprehension Verbalized understanding          PT Short Term Goals - 10/09/16 1026      PT SHORT TERM GOAL #1   Title Pt will be independent with assistance from caregiver and verbalize understand of initial HEP to further progress toward her goals. (Target Date for all STGs: 10/05/16)   Baseline 10/09/16 PT verbalized understand of HEP and states she performs 3x per week   Time 4   Period Weeks   Status Achieved     PT SHORT TERM GOAL #2   Title Pt will improve Berg Balance score to > or = 26/56 to indicate an improvement in static balance.   Baseline 23/56 on 10/01/16   Time 4   Period Weeks   Status Not Met     PT SHORT TERM GOAL #3   Title Pt will improve TUG time to < or = 32.5 sec indicating an improvemet in functional mobility and balance.   Baseline 32.00 seconds with RW and HO at S level on 10/01/16   Time 4   Period Weeks   Status Achieved     PT SHORT TERM GOAL #4   Title Pt will improve initial gait speed by 0.6 ft/sec to indicate improvement in functional mobility and functional status.   Baseline 2.44 ft/sec on 10/01/16 (up  from 2.03 ft/sec with min/guard)   Time 4   Period Weeks   Status Partially Met     PT SHORT TERM GOAL #5   Title Pt will ambulate 25f with LRAD and supervision indoors over level surfaces including carpet and hard flors to  incr ability to safely ambulate at home.   Baseline 10/09/16 Pt ambulated 220 feet before needing a break due to fatigue.   Time 4   Period Weeks   Status Partially Met     PT SHORT TERM GOAL #6   Title Pt will negotiate 4 steps with B UE support and supervision to improve pt's ability to safely enter/exit her house.   Baseline 10/09/16 Patient negociated 4 steps with L UE for support w/ supervision.   Time 4   Period Weeks   Status Achieved           PT Long Term Goals - 09/07/16 1354      PT LONG TERM GOAL #1   Title Pt will be independent and verbalize understanding of HEP an on-going fitness plan to maintain progress made in therapy and improve overall health. (Target Date for all LTGs: 11/02/16)   Time 8   Period Weeks   Status New     PT LONG TERM GOAL #2   Title Pt will improve Berg Balance score to > or = 29/56 to indicate an improvement in static balance and incr safety when performing ADLs.   Time 8   Period Weeks   Status New     PT LONG TERM GOAL #3   Title Pt will improve TUG time to < or = 29.5 sec to indicate an incr in functional mobility and balance when performing ADLs.   Time 8   Period Weeks   Status New     PT LONG TERM GOAL #4   Title Pt will improve gait speed from baseline by 1.2 ft/sec to indicate an incr in functional mobility and safety.   Time 8   Period Weeks   Status New     PT LONG TERM GOAL #5   Title Pt will ambulate 534f with LRAD and supervision outdoors over level and unlevel paved surfaces, ramps, and curbs to incr functional mobility and safety while ambulating in the community.   Time 8   Period Weeks   Status New     Additional Long Term Goals   Additional Long Term Goals Yes     PT LONG TERM  GOAL #6   Title Pt will negotiate 8 steps with supervision and unilat UE support to incr safety while entering/exiting family/friend's home.   Time 8   Period Weeks   Status New               Plan - 10/15/16 1645    Clinical Impression Statement Skilled session focused on stair negotiation for LTG, high level balance and RLE NMR.     Rehab Potential Fair   Clinical Impairments Affecting Rehab Potential Chronic stroke status, active ovarian CA, HTN, expressive aphasia,    PT Frequency 2x / week   PT Duration 8 weeks   PT Treatment/Interventions ADLs/Self Care Home Management;Functional mobility training;Stair training;Gait training;DME Instruction;Therapeutic activities;Therapeutic exercise;Balance training;Neuromuscular re-education;Patient/family education;Orthotic Fit/Training;Manual techniques;Energy conservation   PT Next Visit Plan Look to D/C early if she is meeting goals, LE strengthening (focus on quads & terminal knee extension) and balance activities that challenge activity tolerance, gait with RW with hand orthotic on compliant surfaces and continue to work on obstacle negotiation/barriers   Consulted and Agree with Plan of Care Patient      Patient will benefit from skilled therapeutic intervention in order to improve the following deficits and impairments:  Abnormal gait, Cardiopulmonary status limiting activity, Decreased activity tolerance, Decreased balance,  Decreased mobility, Decreased knowledge of use of DME, Decreased knowledge of precautions, Decreased endurance, Decreased safety awareness, Decreased strength, Impaired perceived functional ability, Impaired flexibility, Improper body mechanics, Impaired UE functional use  Visit Diagnosis: Hemiplegia and hemiparesis following cerebral infarction affecting right non-dominant side (HCC)  Unsteadiness on feet  Muscle weakness (generalized)       G-Codes - 10-28-2016 1653    Functional Assessment Tool Used BERG:  23/56, TUG 32 secs   Functional Limitation Mobility: Walking and moving around   Mobility: Walking and Moving Around Current Status [phone]) At least 40 percent but less than 60 percent impaired, limited or restricted   Mobility: Walking and Moving Around Goal Status [phone]) At least 40 percent but less than 60 percent impaired, limited or restricted      Physical Therapy Progress Note  Dates of Reporting Period: 09/07/16 to 2016-10-28  Objective Reports of Subjective Statement: See above  Objective Measurements: BERG: 23/56  Goal Update: See LTGs  Plan: Continue POC  Reason Skilled Services are Required: Continues to demonstrate balance deficits, decreased use of RUE/LE, decreased endurance and poor posture.       Problem List Patient Active Problem List   Diagnosis Date Noted  . Genetic testing 08/17/2016  . Leukopenia due to antineoplastic chemotherapy ([place]) 06/05/2016  . Costochondritis, acute 05/02/2016  . Ovarian cancer ([place]) 03/24/2016  . Anemia due to antineoplastic chemotherapy 03/17/2016  . Port catheter in place 03/17/2016  . Portacath in place 02/05/2016  . Chemotherapy-induced peripheral neuropathy ([place]) 02/05/2016  . Hematoma 01/27/2016  . Chronic anticoagulation 01/22/2016  . Chronic atrial fibrillation ([place]) 01/22/2016  . PICC (peripherally inserted central catheter) in place 01/22/2016  . Chemotherapy induced neutropenia ([place]) 01/22/2016  . Poor venous access 01/22/2016  . CVA (cerebral vascular accident) ([place]) 12/27/2015  . Right hemiparesis ([place]) 12/27/2015  . Expressive aphasia 12/27/2015  . History of left breast cancer 12/27/2015  . H/O tubal ligation 12/27/2015  . Colon polyps 12/27/2015  . International Federation of Gynecology and Obstetrics (FIGO) stage IVB epithelial ovarian cancer ([place]) 12/27/2015  . Ovarian cancer, bilateral ([place]) 12/24/2015  . Inguinal adenopathy   . Carcinomatosis ([place]) 12/06/2015  . Essential hypertension, benign  07/18/2014  . Atrial fibrillation, unspecified 07/18/2014  . Anxiety state, unspecified 07/18/2014    Cameron Sprang, PT, MPT [facility] [address] [place], [place], [address] Phone: [phone]   Fax:  [phone] 2016-10-28, 4:57 PM  Name: Brenda Cunningham MRN: [id number] Date of Birth: 11/13/1951

## 2016-10-15 NOTE — Patient Instructions (Signed)
  Please complete the assigned speech therapy homework and return it to your next session.  

## 2016-10-15 NOTE — Therapy (Signed)
[facility] [address] [place], [place], [address] Phone: [phone]   Fax:  [phone]  Speech Language Pathology Treatment  Patient Details  Name: Brenda Cunningham MRN: [id number] Date of Birth: Jun 23, 1951 Referring Provider: Darra Lis, M.D.  Encounter Date: 10/15/2016      End of Session - 10/15/16 1703    Visit Number 7   Number of Visits 17   Date for SLP Re-Evaluation 12/04/16   SLP Start Time 1533   SLP Stop Time  1615   SLP Time Calculation (min) 42 min   Activity Tolerance Patient tolerated treatment well      Past Medical History:  Diagnosis Date  . A-fib ([place])   . Anxiety   . Arthritis   . Breast cancer ([place]) 2008   s/p lumpectomy, chemo and radiation therapy  . Complication of anesthesia    DIFFICULTY WAKING UP FROM ANESTHESIA  . Hyperlipidemia   . Hypertension   . Stroke ([place]) 07/29/2015  . Wears glasses     Past Surgical History:  Procedure Laterality Date  . APPENDECTOMY    . BREAST LUMPECTOMY  2008   left  . COLONOSCOPY  2004  . DEBULKING N/A 03/24/2016   Procedure: RADICAL TUMOR DEBULKING;  Surgeon: Everitt Amber, MD;  [facility]: [facility];  Service: Gynecology;  Laterality: N/A;  . OMENTECTOMY N/A 03/24/2016   Procedure: OMENTECTOMY;  Surgeon: Everitt Amber, MD;  Location: [facility];  Service: Gynecology;  Laterality: N/A;  . ROBOTIC ASSISTED TOTAL HYSTERECTOMY WITH BILATERAL SALPINGO OOPHERECTOMY Bilateral 03/24/2016   Procedure: XI ROBOTIC ASSISTED TOTA LAPAROSCOPIC  HYSTERECTOMY WITH BILATERAL SALPINGO OOPHORECTOMY;  Surgeon: Everitt Amber, MD;  Location: [facility];  Service: Gynecology;  Laterality: Bilateral;  . TUBAL LIGATION      There were no vitals filed for this visit.      Subjective Assessment - 10/15/16 1540    Subjective "I'm doing good. How are you?"               ADULT SLP TREATMENT - 10/15/16 1541      General Information   Behavior/Cognition  Alert;Cooperative;Pleasant mood     Treatment Provided   Treatment provided Cognitive-Linquistic     Pain Assessment   Pain Assessment 0-10   Pain Score 2    Pain Location stomach   Pain Descriptors / Indicators Aching   Pain Intervention(s) Monitored during session     Cognitive-Linquistic Treatment   Treatment focused on Aphasia   Skilled Treatment SLP targeted pt's verbal expression today in the sentence level and 2-3 sentence level. Pt with semantic and phonemic errors during responses as well as dysfluencies noted. Pt showed limited awareness of her errors.      Assessment / Recommendations / Plan   Plan Continue with current plan of care     Progression Toward Goals   Progression toward goals Progressing toward goals            SLP Short Term Goals - 10/15/16 1704      SLP SHORT TERM GOAL #1   Title pt will generate appropriate language to participate in functional 10 minutes mod complex converastion with occasional  min A   Time 1   Period Weeks   Status Not Met     SLP SHORT TERM GOAL #2   Title pt will generate sentence level responses in structured tasks with 90% success over three sessions    Baseline 10-15-16   Time 1   Period Weeks  Status Not Met     SLP SHORT TERM GOAL #3   Title pt will demo understanding of 10 minutes mod-complex conversation with rare min A   Status Achieved     SLP SHORT TERM GOAL #4   Title pt will attempt multimodal communication PRN in mod complex conversation   Time 1   Period Weeks   Status Not Met     SLP SHORT TERM GOAL #5   Title pt will attempt to use multimodal communicaiton when verbal communication unsuccessful/difficult    Status Achieved          SLP Long Term Goals - 10/15/16 1706      SLP LONG TERM GOAL #1   Title pt will engage in mod complex-complex conversation functionally with modified independence (multimodal communication, compensations, extra time) over 3 sessions   Time 5   Period Weeks    Status On-going     SLP LONG TERM GOAL #2   Title pt will demo understanding of mod complex-complex conversation with modified independence   Time 5   Period Weeks   Status On-going          Plan - 10/15/16 1703    Clinical Impression Statement Pt with persistent aphasia and dysfluency. Pt able to use compensatory strategies of slower rate and descriptive language with mod A today. Skilled ST remains needed to maximize pt's verbal communicaiton.    Speech Therapy Frequency 2x / week   Treatment/Interventions Language facilitation;Internal/external aids;SLP instruction and feedback;Multimodal communcation approach;Functional tasks;Patient/family education;Compensatory strategies   Potential to Achieve Goals Good   Potential Considerations Severity of impairments      Patient will benefit from skilled therapeutic intervention in order to improve the following deficits and impairments:   Aphasia  Verbal apraxia    Problem List Patient Active Problem List   Diagnosis Date Noted  . Genetic testing 08/17/2016  . Leukopenia due to antineoplastic chemotherapy ([place]) 06/05/2016  . Costochondritis, acute 05/02/2016  . Ovarian cancer ([place]) 03/24/2016  . Anemia due to antineoplastic chemotherapy 03/17/2016  . Port catheter in place 03/17/2016  . Portacath in place 02/05/2016  . Chemotherapy-induced peripheral neuropathy ([place]) 02/05/2016  . Hematoma 01/27/2016  . Chronic anticoagulation 01/22/2016  . Chronic atrial fibrillation ([place]) 01/22/2016  . PICC (peripherally inserted central catheter) in place 01/22/2016  . Chemotherapy induced neutropenia ([place]) 01/22/2016  . Poor venous access 01/22/2016  . CVA (cerebral vascular accident) ([place]) 12/27/2015  . Right hemiparesis ([place]) 12/27/2015  . Expressive aphasia 12/27/2015  . History of left breast cancer 12/27/2015  . H/O tubal ligation 12/27/2015  . Colon polyps 12/27/2015  . International Federation of Gynecology and Obstetrics  (FIGO) stage IVB epithelial ovarian cancer ([place]) 12/27/2015  . Ovarian cancer, bilateral ([place]) 12/24/2015  . Inguinal adenopathy   . Carcinomatosis ([place]) 12/06/2015  . Essential hypertension, benign 07/18/2014  . Atrial fibrillation, unspecified 07/18/2014  . Anxiety state, unspecified 07/18/2014    [facility] ,[place], CCC-SLP  10/15/2016, 5:07 PM  [facility] [address] [place], [place], [address] Phone: [phone]   Fax:  [phone]   Name: Brenda Cunningham MRN: [id number] Date of Birth: December 05, 1951

## 2016-10-15 NOTE — Therapy (Signed)
[facility] [address] [place], [place], [address] Phone: [phone]   Fax:  [phone]  Occupational Therapy Treatment  Patient Details  Name: Brenda Cunningham MRN: [id number] Date of Birth: 12-24-1950 Referring Provider: Helane Rima  Encounter Date: 10/15/2016      OT End of Session - 10/15/16 1555    Visit Number 9   Number of Visits 16   Date for OT Re-Evaluation 11/02/16   Authorization Type medicare will need G code and PN every 10th visit   Authorization Time Period 60 days   Authorization - Visit Number 9   Authorization - Number of Visits 10   OT Start Time 1402   OT Stop Time 1444   OT Time Calculation (min) 42 min   Activity Tolerance Patient tolerated treatment well      Past Medical History:  Diagnosis Date  . A-fib ([place])   . Anxiety   . Arthritis   . Breast cancer ([place]) 2008   s/p lumpectomy, chemo and radiation therapy  . Complication of anesthesia    DIFFICULTY WAKING UP FROM ANESTHESIA  . Hyperlipidemia   . Hypertension   . Stroke ([place]) 07/29/2015  . Wears glasses     Past Surgical History:  Procedure Laterality Date  . APPENDECTOMY    . BREAST LUMPECTOMY  2008   left  . COLONOSCOPY  2004  . DEBULKING N/A 03/24/2016   Procedure: RADICAL TUMOR DEBULKING;  Surgeon: Everitt Amber, MD;  Location: [facility];  Service: Gynecology;  Laterality: N/A;  . OMENTECTOMY N/A 03/24/2016   Procedure: OMENTECTOMY;  Surgeon: Everitt Amber, MD;  Location: [facility];  Service: Gynecology;  Laterality: N/A;  . ROBOTIC ASSISTED TOTAL HYSTERECTOMY WITH BILATERAL SALPINGO OOPHERECTOMY Bilateral 03/24/2016   Procedure: XI ROBOTIC ASSISTED TOTA LAPAROSCOPIC  HYSTERECTOMY WITH BILATERAL SALPINGO OOPHORECTOMY;  Surgeon: Everitt Amber, MD;  Location: [facility];  Service: Gynecology;  Laterality: Bilateral;  . TUBAL LIGATION      There were no vitals filed for this visit.      Subjective Assessment - 10/15/16 1416    Subjective  I don't know about my appts (reviewed and printed out new schedule for pt)   Pertinent History see epic snapshot - PT WITH ACTIVE OVARIAN CANCER and cardiomegaly;  high risk for recurrence of stroke.    Patient Stated Goals Walk and my hand - I like being here.  I want to be able to walk in to my dt's house by November.   Currently in Pain? Yes   Pain Score 2    Pain Location Abdomen   Pain Orientation Lower   Pain Descriptors / Indicators Aching   Pain Type Acute pain   Pain Onset In the past 7 days   Pain Frequency Intermittent   Aggravating Factors  I don't know   Pain Relieving Factors I don't know                      OT Treatments/Exercises (OP) - 10/15/16 0001      Neurological Re-education Exercises   Other Exercises 1 Neuro re ed to address grasp and release with wrist cock up splint on for support.  Pt able to use LUE as gross assist with several tasks today when she is wearing splint.  Pt needs to assist L hand with placing object into hand (due to very little active extension) howvever then can hold, pick up and move objects with L hand.  Pt is  also able to "relax' L hand to initiate release approximately 40% of the time during a functional task.  Pt given several ideas of how to use LUE at home with splint. Pt able to verbalize understanding and return demonstrate - pt also has shared written HEP with son.                   OT Short Term Goals - 10/15/16 1554      OT SHORT TERM GOAL #1   Title Pt and family will be mod I with HEP - 10/06/2016   Status Achieved     OT SHORT TERM GOAL #2   Title Pt will report no more than 3/10 pain in R shoulder with overhead reach   Status Achieved     OT SHORT TERM GOAL #3   Title Pt will demonstrate 80* of shoulder flexion and abduction in RUE for overhead reach   Status Achieved     OT SHORT TERM GOAL #4   Title Pt will require no more than supervision for static standing balance in prep for  functional activity in standing   Status Achieved     OT SHORT TERM GOAL #5   Title Pt will be mod I with hiking pants 100% of the time.   Status Achieved           OT Long Term Goals - 10/15/16 1554      OT LONG TERM GOAL #1   Title Pt and family will be mod I with upgraded HEP - 11/03/2015   Status Achieved     OT LONG TERM GOAL #2   Title Pt will require no more than min a for dynamic standing balance during functional activity   Status Achieved     OT LONG TERM GOAL #3   Title Pt will report no than 2/10 pain with overhead reach   Status Achieved     OT LONG TERM GOAL #4   Title Pt will demonstrate 85* of shoulder flexion for overhead reach   Status Achieved  92*     OT LONG TERM GOAL #5   Title Pt will demonstrate 80* of abduction for overhead reach   Status Achieved  83*     OT LONG TERM GOAL #6   Title Pt will be supevsion only for tub bench transfers   Status Achieved     OT LONG TERM GOAL #7   Title Pt will use RUE as stabilzer with splint during basic ADL tasks prn   Status Achieved     OT LONG TERM GOAL #8   Title Pt will tolerate at least 10 minutes of standing activity without requiring rest break.   Status Achieved               Plan - 10/15/16 1555    Clinical Impression Statement Pt has made excellent functional progress and has met all goals.  Pt is ready for d/c from OT and in agreement.   Rehab Potential Fair   Clinical Impairments Affecting Rehab Potential given severity of deficits, medical status   OT Frequency 2x / week   OT Duration 8 weeks   OT Treatment/Interventions Self-care/ADL training;Electrical Stimulation;Moist Heat;DME and/or AE instruction;Neuromuscular education;Therapeutic exercise;Functional Mobility Training;Manual Therapy;Passive range of motion;Splinting;Therapeutic activities;Balance training;Patient/family education;Cognitive remediation/compensation;Visual/perceptual remediation/compensation   Plan d/c from OT    Consulted and Agree with Plan of Care Patient      Patient will benefit from skilled therapeutic intervention in order to  improve the following deficits and impairments:  Abnormal gait, Cardiopulmonary status limiting activity, Decreased activity tolerance, Decreased balance, Decreased cognition, Decreased mobility, Decreased range of motion, Decreased safety awareness, Difficulty walking, Decreased strength, Impaired UE functional use, Impaired tone, Impaired sensation, Impaired vision/preception, Pain  Visit Diagnosis: Hemiplegia and hemiparesis following cerebral infarction affecting right non-dominant side (HCC)  Unsteadiness on feet  Muscle weakness (generalized)  Stiffness of right shoulder, not elsewhere classified  Visuospatial deficit  Frontal lobe and executive function deficit  Chronic right shoulder pain      G-Codes - 11-09-2016 1556    Functional Assessment Tool Used skilled clinical observation pt unable to complete standardized UE assessments   Functional Limitation Self care   Self Care Current Status (H4035) At least 20 percent but less than 40 percent impaired, limited or restricted   Self Care Goal Status (C4818) At least 20 percent but less than 40 percent impaired, limited or restricted   Self Care Discharge Status [phone]) At least 20 percent but less than 40 percent impaired, limited or restricted      Problem List Patient Active Problem List   Diagnosis Date Noted  . Genetic testing 08/17/2016  . Leukopenia due to antineoplastic chemotherapy ([place]) 06/05/2016  . Costochondritis, acute 05/02/2016  . Ovarian cancer ([place]) 03/24/2016  . Anemia due to antineoplastic chemotherapy 03/17/2016  . Port catheter in place 03/17/2016  . Portacath in place 02/05/2016  . Chemotherapy-induced peripheral neuropathy ([place]) 02/05/2016  . Hematoma 01/27/2016  . Chronic anticoagulation 01/22/2016  . Chronic atrial fibrillation ([place]) 01/22/2016  . PICC (peripherally  inserted central catheter) in place 01/22/2016  . Chemotherapy induced neutropenia ([place]) 01/22/2016  . Poor venous access 01/22/2016  . CVA (cerebral vascular accident) ([place]) 12/27/2015  . Right hemiparesis ([place]) 12/27/2015  . Expressive aphasia 12/27/2015  . History of left breast cancer 12/27/2015  . H/O tubal ligation 12/27/2015  . Colon polyps 12/27/2015  . International Federation of Gynecology and Obstetrics (FIGO) stage IVB epithelial ovarian cancer ([place]) 12/27/2015  . Ovarian cancer, bilateral ([place]) 12/24/2015  . Inguinal adenopathy   . Carcinomatosis ([place]) 12/06/2015  . Essential hypertension, benign 07/18/2014  . Atrial fibrillation, unspecified 07/18/2014  . Anxiety state, unspecified 07/18/2014   OCCUPATIONAL THERAPY DISCHARGE SUMMARY  Visits from Start of Care: 9  Current functional level related to goals / functional outcomes: See above   Remaining deficits: Spastic hemiplegia, decreased higher level balance, decreased activity tolerance   Education / Equipment: HEP Plan: Patient agrees to discharge.  Patient goals were met. Patient is being discharged due to meeting the stated rehab goals.  ?????     Quay Burow, OTR/L 09-Nov-2016, 3:57 PM  [facility] [address] [place], [place], [address] Phone: [phone]   Fax:  [phone]  Name: Brenda Cunningham MRN: [id number] Date of Birth: [birth year]

## 2016-10-16 ENCOUNTER — Other Ambulatory Visit: Payer: Medicare Other

## 2016-10-16 ENCOUNTER — Telehealth: Payer: Self-pay | Admitting: Gynecologic Oncology

## 2016-10-16 ENCOUNTER — Ambulatory Visit (HOSPITAL_COMMUNITY): Payer: Medicare Other

## 2016-10-16 NOTE — Telephone Encounter (Signed)
Returned call to patient's daughter.  She had called previously asking about her mother's left sided abdominal discomfort.  Discussed with Dr. Denman George and CT re-reviewed.  No explanation for pain per CT and no recommendations per Dr. Denman George.  Advised the daughter to call for any concerns or if the pain worsened.

## 2016-10-19 ENCOUNTER — Ambulatory Visit: Payer: Medicare Other

## 2016-10-19 ENCOUNTER — Encounter: Payer: Medicare Other | Admitting: Occupational Therapy

## 2016-10-19 ENCOUNTER — Encounter: Payer: Self-pay | Admitting: Physical Therapy

## 2016-10-19 ENCOUNTER — Ambulatory Visit: Payer: Medicare Other | Admitting: Physical Therapy

## 2016-10-19 DIAGNOSIS — R2689 Other abnormalities of gait and mobility: Secondary | ICD-10-CM

## 2016-10-19 DIAGNOSIS — I69353 Hemiplegia and hemiparesis following cerebral infarction affecting right non-dominant side: Secondary | ICD-10-CM | POA: Diagnosis not present

## 2016-10-19 DIAGNOSIS — R2681 Unsteadiness on feet: Secondary | ICD-10-CM | POA: Diagnosis not present

## 2016-10-19 DIAGNOSIS — M6281 Muscle weakness (generalized): Secondary | ICD-10-CM | POA: Diagnosis not present

## 2016-10-19 DIAGNOSIS — R4701 Aphasia: Secondary | ICD-10-CM

## 2016-10-19 DIAGNOSIS — R482 Apraxia: Secondary | ICD-10-CM

## 2016-10-19 NOTE — Therapy (Signed)
[facility] [address] [place], [place], [address] Phone: [phone]   Fax:  [phone]  Physical Therapy Treatment  Patient Details  Name: Brenda Cunningham MRN: [id number] Date of Birth: 1951/06/29 Referring Provider: Helane Rima, MD  Encounter Date: 10/19/2016      PT End of Session - 10/19/16 1112    Visit Number 11   Number of Visits 17   Date for PT Re-Evaluation 11/06/16   Authorization Type Medicare Part A and B   Authorization Time Period G-Code every 10th visit   PT Start Time 1018   PT Stop Time 1059   PT Time Calculation (min) 41 min   Equipment Utilized During Treatment Gait belt   Activity Tolerance Patient tolerated treatment well   Behavior During Therapy WFL for tasks assessed/performed      Past Medical History:  Diagnosis Date  . A-fib ([place])   . Anxiety   . Arthritis   . Breast cancer ([place]) 2008   s/p lumpectomy, chemo and radiation therapy  . Complication of anesthesia    DIFFICULTY WAKING UP FROM ANESTHESIA  . Hyperlipidemia   . Hypertension   . Stroke ([place]) 07/29/2015  . Wears glasses     Past Surgical History:  Procedure Laterality Date  . APPENDECTOMY    . BREAST LUMPECTOMY  2008   left  . COLONOSCOPY  2004  . DEBULKING N/A 03/24/2016   Procedure: RADICAL TUMOR DEBULKING;  Surgeon: Everitt Amber, MD;  [facility];  Service: Gynecology;  Laterality: N/A;  . OMENTECTOMY N/A 03/24/2016   Procedure: OMENTECTOMY;  Surgeon: Everitt Amber, MD;  [facility]: [facility];  Service: Gynecology;  Laterality: N/A;  . ROBOTIC ASSISTED TOTAL HYSTERECTOMY WITH BILATERAL SALPINGO OOPHERECTOMY Bilateral 03/24/2016   Procedure: XI ROBOTIC ASSISTED TOTA LAPAROSCOPIC  HYSTERECTOMY WITH BILATERAL SALPINGO OOPHORECTOMY;  Surgeon: Everitt Amber, MD;  Location: [facility];  Service: Gynecology;  Laterality: Bilateral;  . TUBAL LIGATION      There were no vitals filed for this visit.      Subjective  Assessment - 10/19/16 1028                          Daughter is coming down from [place] for Christmas.   Currently in Pain? No/denies                         [facility] Adult PT Treatment/Exercise - 10/19/16 0001      Ambulation/Gait   Ambulation/Gait Yes   Ambulation/Gait Assistance 6: Modified independent (Device/Increase time)   Ambulation/Gait Assistance Details practised in community envirionment   Ambulation Distance (Feet) 400 Feet   Assistive device Rolling walker  with R hand grip   Gait Pattern Step-through pattern;Decreased stride length;Decreased hip/knee flexion - right;Trunk flexed;Narrow base of support   Ambulation Surface Gravel;Paved;Outdoor;Indoor;Unlevel;Level   Stairs Yes   Stairs Assistance 6: Modified independent (Device/Increase time)   Stair Management Technique One rail Left;Alternating pattern   Number of Stairs 4   Height of Stairs 6   Curb 6: Modified independent (Device/increase time)   Curb Details (indicate cue type and reason) with RW, no LOB, performed safely on outdoor curb     Knee/Hip Exercises: Aerobic   Other Aerobic Sci Fit level 1.0, all extremities with rigth hand acewrapped to handle to stabilize (no pain in Right UE ), x 12mn  Balance Exercises - 10/19/16 1044      Balance Exercises: Standing   Stepping Strategy Anterior;Posterior  left foot stepping forward/backwards without UE support, facilitating increase R LE weight bearing and control; min guard.   Sit to Stand Time min to mod A without UE support, cues for weight shifting forward, facilitating increase LLE weigh bearing                                          PT Short Term Goals - 10/09/16 1026      PT SHORT TERM GOAL #1   Title Pt will be independent with assistance from caregiver and verbalize understand of initial HEP to further progress toward her goals. (Target Date for all STGs: 10/05/16)   Baseline 10/09/16 PT verbalized understand of HEP  and states she performs 3x per week   Time 4   Period Weeks   Status Achieved     PT SHORT TERM GOAL #2   Title Pt will improve Berg Balance score to > or = 26/56 to indicate an improvement in static balance.   Baseline 23/56 on 10/01/16   Time 4   Period Weeks   Status Not Met     PT SHORT TERM GOAL #3   Title Pt will improve TUG time to < or = 32.5 sec indicating an improvemet in functional mobility and balance.   Baseline 32.00 seconds with RW and HO at S level on 10/01/16   Time 4   Period Weeks   Status Achieved     PT SHORT TERM GOAL #4   Title Pt will improve initial gait speed by 0.6 ft/sec to indicate improvement in functional mobility and functional status.   Baseline 2.44 ft/sec on 10/01/16 (up from 2.03 ft/sec with min/guard)   Time 4   Period Weeks   Status Partially Met     PT SHORT TERM GOAL #5   Title Pt will ambulate 250f with LRAD and supervision indoors over level surfaces including carpet and hard flors to incr ability to safely ambulate at home.   Baseline 10/09/16 Pt ambulated 220 feet before needing a break due to fatigue.   Time 4   Period Weeks   Status Partially Met     PT SHORT TERM GOAL #6   Title Pt will negotiate 4 steps with B UE support and supervision to improve pt's ability to safely enter/exit her house.   Baseline 10/09/16 Patient negociated 4 steps with L UE for support w/ supervision.   Time 4   Period Weeks   Status Achieved           PT Long Term Goals - 09/07/16 1354      PT LONG TERM GOAL #1   Title Pt will be independent and verbalize understanding of HEP an on-going fitness plan to maintain progress made in therapy and improve overall health. (Target Date for all LTGs: 11/02/16)   Time 8   Period Weeks   Status New     PT LONG TERM GOAL #2   Title Pt will improve Berg Balance score to > or = 29/56 to indicate an improvement in static balance and incr safety when performing ADLs.   Time 8   Period Weeks   Status New      PT LONG TERM GOAL #3   Title Pt will improve TUG time to <  or = 29.5 sec to indicate an incr in functional mobility and balance when performing ADLs.   Time 8   Period Weeks   Status New     PT LONG TERM GOAL #4   Title Pt will improve gait speed from baseline by 1.2 ft/sec to indicate an incr in functional mobility and safety.   Time 8   Period Weeks   Status New     PT LONG TERM GOAL #5   Title Pt will ambulate 548f with LRAD and supervision outdoors over level and unlevel paved surfaces, ramps, and curbs to incr functional mobility and safety while ambulating in the community.   Time 8   Period Weeks   Status New     Additional Long Term Goals   Additional Long Term Goals Yes     PT LONG TERM GOAL #6   Title Pt will negotiate 8 steps with supervision and unilat UE support to incr safety while entering/exiting family/friend's home.   Time 8   Period Weeks   Status New               Plan - 10/19/16 1113    Clinical Impression Statement Progressing with community gait with RW with no LOB and demonstrating safe technique.  RLE strengthening and NMR continue to challenge pt at this time.   Rehab Potential Fair   Clinical Impairments Affecting Rehab Potential Chronic stroke status, active ovarian CA, HTN, expressive aphasia,    PT Frequency 2x / week   PT Duration 8 weeks   PT Treatment/Interventions ADLs/Self Care Home Management;Functional mobility training;Stair training;Gait training;DME Instruction;Therapeutic activities;Therapeutic exercise;Balance training;Neuromuscular re-education;Patient/family education;Orthotic Fit/Training;Manual techniques;Energy conservation   PT Next Visit Plan Look to D/C early if she is meeting goals, LE strengthening (focus on quads & terminal knee extension) and balance activities that challenge activity tolerance, gait with RW with hand orthotic on compliant surfaces and continue to work on obstacle negotiation/barriers   Consulted  and Agree with Plan of Care Patient      Patient will benefit from skilled therapeutic intervention in order to improve the following deficits and impairments:  Abnormal gait, Cardiopulmonary status limiting activity, Decreased activity tolerance, Decreased balance, Decreased mobility, Decreased knowledge of use of DME, Decreased knowledge of precautions, Decreased endurance, Decreased safety awareness, Decreased strength, Impaired perceived functional ability, Impaired flexibility, Improper body mechanics, Impaired UE functional use  Visit Diagnosis: Hemiplegia and hemiparesis following cerebral infarction affecting right non-dominant side (HCC)  Unsteadiness on feet  Muscle weakness (generalized)  Other abnormalities of gait and mobility     Problem List Patient Active Problem List   Diagnosis Date Noted  . Genetic testing 08/17/2016  . Leukopenia due to antineoplastic chemotherapy ([facility] 06/05/2016  . Costochondritis, acute 05/02/2016  . Ovarian cancer ([place] 03/24/2016  . Anemia due to antineoplastic chemotherapy 03/17/2016  . Port catheter in place 03/17/2016  . Portacath in place 02/05/2016  . Chemotherapy-induced peripheral neuropathy ([facility] 02/05/2016  . Hematoma 01/27/2016  . Chronic anticoagulation 01/22/2016  . Chronic atrial fibrillation ([place] 01/22/2016  . PICC (peripherally inserted central catheter) in place 01/22/2016  . Chemotherapy induced neutropenia ([facility] 01/22/2016  . Poor venous access 01/22/2016  . CVA (cerebral vascular accident) ([facility] 12/27/2015  . Right hemiparesis ([facility] 12/27/2015  . Expressive aphasia 12/27/2015  . History of left breast cancer 12/27/2015  . H/O tubal ligation 12/27/2015  . Colon polyps 12/27/2015  . International Federation of Gynecology and Obstetrics (FIGO) stage IVB epithelial ovarian cancer ([place] 12/27/2015  .  Ovarian cancer, bilateral ([facility] [place]) 12/24/2015  . Inguinal adenopathy   . Carcinomatosis ([place]) 12/06/2015  . Essential  hypertension, benign 07/18/2014  . Atrial fibrillation, unspecified 07/18/2014  . Anxiety state, unspecified 07/18/2014    Bjorn Loser, PTA  10/19/16, 11:18 AM [facility] [address] [place], [place], [address] Phone: [phone]   Fax:  [phone]  Name: Brenda Cunningham MRN: [id number] Date of Birth: 1951/05/19

## 2016-10-19 NOTE — Therapy (Signed)
[facility] [address] [place], [place], [address] Phone: [phone]   Fax:  [phone]  Speech Language Pathology Treatment  Patient Details  Name: Brenda Cunningham MRN: [id number] Date of Birth: 1951/03/11 Referring Provider: Darra Lis, M.D.  Encounter Date: 10/19/2016      End of Session - 10/19/16 1007    Visit Number 8   Number of Visits 17   Date for SLP Re-Evaluation 12/04/16   SLP Start Time 0936   SLP Stop Time  1016   SLP Time Calculation (min) 40 min      Past Medical History:  Diagnosis Date  . A-fib ([place])   . Anxiety   . Arthritis   . Breast cancer ([place]) 2008   s/p lumpectomy, chemo and radiation therapy  . Complication of anesthesia    DIFFICULTY WAKING UP FROM ANESTHESIA  . Hyperlipidemia   . Hypertension   . Stroke ([place]) 07/29/2015  . Wears glasses     Past Surgical History:  Procedure Laterality Date  . APPENDECTOMY    . BREAST LUMPECTOMY  2008   left  . COLONOSCOPY  2004  . DEBULKING N/A 03/24/2016   Procedure: RADICAL TUMOR DEBULKING;  Surgeon: Everitt Amber, MD;  Location: [facility];  Service: Gynecology;  Laterality: N/A;  . OMENTECTOMY N/A 03/24/2016   Procedure: OMENTECTOMY;  Surgeon: Everitt Amber, MD;  Location: [facility];  Service: Gynecology;  Laterality: N/A;  . ROBOTIC ASSISTED TOTAL HYSTERECTOMY WITH BILATERAL SALPINGO OOPHERECTOMY Bilateral 03/24/2016   Procedure: XI ROBOTIC ASSISTED TOTA LAPAROSCOPIC  HYSTERECTOMY WITH BILATERAL SALPINGO OOPHORECTOMY;  Surgeon: Everitt Amber, MD;  Location: [facility];  Service: Gynecology;  Laterality: Bilateral;  . TUBAL LIGATION      There were no vitals filed for this visit.      Subjective Assessment - 10/19/16 0959    Subjective Pt req'd A removing jacket. Stated that this weekend her roommate said she has improved with her speech.               ADULT SLP TREATMENT - 10/19/16 0942      General Information   Behavior/Cognition  Alert;Cooperative;Pleasant mood     Treatment Provided   Treatment provided Cognitive-Linquistic     Cognitive-Linquistic Treatment   Treatment focused on Aphasia   Skilled Treatment Semantic (i.e, "watching"/looking, "She"/He, "makes"/making) and phonemic/apraxic ("wushing"/brushing, "kwill"/drill) errors noted in 2-3 sentence responses. Pt used slower rate during this task to compensate for errors with good success. Pt was functional in simple to mod complex conversation about the weekend, daughter's daycare, and speaking situations over the weekend.  Very little dysfluency noted in pt's conversational speech today (<5%).     Assessment / Recommendations / Plan   Plan Continue with current plan of care     Progression Toward Goals   Progression toward goals Progressing toward goals            SLP Short Term Goals - 10/19/16 1032      SLP SHORT TERM GOAL #1   Title pt will generate appropriate language to participate in functional 10 minutes mod complex converastion with occasional  min A   Time 1   Period Weeks   Status Not Met     SLP SHORT TERM GOAL #2   Title pt will generate sentence level responses in structured tasks with 90% success over three sessions    Baseline 10-15-16, 10-19-16   Time 1   Period Weeks   Status Not Met  SLP SHORT TERM GOAL #3   Title pt will demo understanding of 10 minutes mod-complex conversation with rare min A   Status Achieved     SLP SHORT TERM GOAL #4   Title pt will attempt multimodal communication PRN in mod complex conversation   Time 1   Period Weeks   Status Not Met     SLP SHORT TERM GOAL #5   Title --   Status --          SLP Long Term Goals - 10/19/16 1034      SLP LONG TERM GOAL #1   Title pt will engage in mod complex-complex conversation functionally with modified independence (multimodal communication, compensations, extra time) over 3 sessions   Time 4   Period Weeks   Status On-going     SLP LONG TERM  GOAL #2   Title pt will demo understanding of mod complex-complex conversation with modified independence   Time 4   Period Weeks   Status On-going          Plan - 10/19/16 1007    Clinical Impression Statement (P)  Pt with persistent aphasia and apraxia. Apraxia increases the more complex the conversation becomes and makes pt's conversational speech (mod -max copmlex) increasingly non-functional at those more complex levels. Pt was able to use compensatory strategies of slower rate successfully in simple and mod complex conversation today. Skilled ST remains needed to maximize pt's verbal communicaiton.    Speech Therapy Frequency (P)  2x / week   Duration (P)  --  8 weeks   Treatment/Interventions (P)  Language facilitation;Internal/external aids;SLP instruction and feedback;Multimodal communcation approach;Functional tasks;Patient/family education;Compensatory strategies   Potential to Achieve Goals (P)  Good   Potential Considerations (P)  Severity of impairments      Patient will benefit from skilled therapeutic intervention in order to improve the following deficits and impairments:   Aphasia  Verbal apraxia    Problem List Patient Active Problem List   Diagnosis Date Noted  . Genetic testing 08/17/2016  . Leukopenia due to antineoplastic chemotherapy ([place]) 06/05/2016  . Costochondritis, acute 05/02/2016  . Ovarian cancer ([place]) 03/24/2016  . Anemia due to antineoplastic chemotherapy 03/17/2016  . Port catheter in place 03/17/2016  . Portacath in place 02/05/2016  . Chemotherapy-induced peripheral neuropathy ([place]) 02/05/2016  . Hematoma 01/27/2016  . Chronic anticoagulation 01/22/2016  . Chronic atrial fibrillation ([place]) 01/22/2016  . PICC (peripherally inserted central catheter) in place 01/22/2016  . Chemotherapy induced neutropenia ([place]) 01/22/2016  . Poor venous access 01/22/2016  . CVA (cerebral vascular accident) ([place]) 12/27/2015  . Right hemiparesis ([place])  12/27/2015  . Expressive aphasia 12/27/2015  . History of left breast cancer 12/27/2015  . H/O tubal ligation 12/27/2015  . Colon polyps 12/27/2015  . International Federation of Gynecology and Obstetrics (FIGO) stage IVB epithelial ovarian cancer ([place]) 12/27/2015  . Ovarian cancer, bilateral ([place]) 12/24/2015  . Inguinal adenopathy   . Carcinomatosis ([place]) 12/06/2015  . Essential hypertension, benign 07/18/2014  . Atrial fibrillation, unspecified 07/18/2014  . Anxiety state, unspecified 07/18/2014    [facility] ,[place], [place]  10/19/2016, 10:36 AM  [facility] [address] [place], [place], [address] Phone: [phone]   Fax:  [phone]   Name: Brenda Cunningham MRN: [id number] Date of Birth: Jan 21, 1951

## 2016-10-21 ENCOUNTER — Ambulatory Visit: Payer: Medicare Other | Admitting: Gynecologic Oncology

## 2016-10-22 ENCOUNTER — Ambulatory Visit: Payer: Medicare Other | Admitting: Speech Pathology

## 2016-10-22 ENCOUNTER — Ambulatory Visit: Payer: Medicare Other | Admitting: Rehabilitation

## 2016-10-22 ENCOUNTER — Encounter: Payer: Medicare Other | Admitting: Occupational Therapy

## 2016-10-22 ENCOUNTER — Encounter: Payer: Self-pay | Admitting: Rehabilitation

## 2016-10-22 DIAGNOSIS — R2689 Other abnormalities of gait and mobility: Secondary | ICD-10-CM | POA: Diagnosis not present

## 2016-10-22 DIAGNOSIS — R482 Apraxia: Secondary | ICD-10-CM | POA: Diagnosis not present

## 2016-10-22 DIAGNOSIS — R4701 Aphasia: Secondary | ICD-10-CM | POA: Diagnosis not present

## 2016-10-22 DIAGNOSIS — M6281 Muscle weakness (generalized): Secondary | ICD-10-CM

## 2016-10-22 DIAGNOSIS — R2681 Unsteadiness on feet: Secondary | ICD-10-CM | POA: Diagnosis not present

## 2016-10-22 DIAGNOSIS — I69353 Hemiplegia and hemiparesis following cerebral infarction affecting right non-dominant side: Secondary | ICD-10-CM

## 2016-10-22 NOTE — Therapy (Signed)
[facility] [address] [place], [place], [address] Phone: [phone]   Fax:  [phone]  Physical Therapy Treatment  Patient Details  Name: Brenda Cunningham MRN: [id number] Date of Birth: [birth year] Referring Provider: Helane Rima, MD  Encounter Date: 10/22/2016      PT End of Session - 10/22/16 1112    Visit Number 12   Number of Visits 17   Date for PT Re-Evaluation 11/06/16   Authorization Type Medicare Part A and B   Authorization Time Period G-Code every 10th visit   PT Start Time 1104   PT Stop Time 1145   PT Time Calculation (min) 41 min   Equipment Utilized During Treatment Gait belt   Activity Tolerance Patient tolerated treatment well   Behavior During Therapy WFL for tasks assessed/performed      Past Medical History:  Diagnosis Date  . A-fib ([place])   . Anxiety   . Arthritis   . Breast cancer ([place]) 2008   s/p lumpectomy, chemo and radiation therapy  . Complication of anesthesia    DIFFICULTY WAKING UP FROM ANESTHESIA  . Hyperlipidemia   . Hypertension   . Stroke ([place]) 07/29/2015  . Wears glasses     Past Surgical History:  Procedure Laterality Date  . APPENDECTOMY    . BREAST LUMPECTOMY  2008   left  . COLONOSCOPY  2004  . DEBULKING N/A 03/24/2016   Procedure: RADICAL TUMOR DEBULKING;  Surgeon: Everitt Amber, MD;  Location: [facility];  Service: Gynecology;  Laterality: N/A;  . OMENTECTOMY N/A 03/24/2016   Procedure: OMENTECTOMY;  Surgeon: Everitt Amber, MD;  Location: [facility];  Service: Gynecology;  Laterality: N/A;  . ROBOTIC ASSISTED TOTAL HYSTERECTOMY WITH BILATERAL SALPINGO OOPHERECTOMY Bilateral 03/24/2016   Procedure: XI ROBOTIC ASSISTED TOTA LAPAROSCOPIC  HYSTERECTOMY WITH BILATERAL SALPINGO OOPHORECTOMY;  Surgeon: Everitt Amber, MD;  [facility];  Service: Gynecology;  Laterality: Bilateral;  . TUBAL LIGATION      There were no vitals filed for this visit.      Subjective  Assessment - 10/22/16 1111    Subjective "I did a lot of shopping with my sister yesterday."    Limitations Standing;Walking;House hold activities   Patient Stated Goals "I want to get up and see my daughter and get on the train to see my sons in [place]."   Currently in Pain? No/denies                         [facility] Adult PT Treatment/Exercise - 10/22/16 1130      Transfers   Transfers Sit to Stand;Stand to Sit   Sit to Stand 6: Modified independent (Device/Increase time)   Stand to Sit 6: Modified independent (Device/Increase time)     Ambulation/Gait   Ambulation/Gait Yes   Ambulation/Gait Assistance 4: Min guard   Ambulation/Gait Assistance Details Had lengthy discussion with pt regarding her goals for therapy and pt asking whether she will always have to use RW for the rest of her life.  Discussed that PT feels she could likely progress to cane level, but will likely always need some form of support for safety due to deficits from CVA.  Pt verbalized understanding.  Therefore session focused on gait with [facility] as this is what she currently has at home.  Performed gait over varying indoor surfaces to better simulate home.  Pt requires min/guard x 300' with cues for upright posture and forward gaze.  Also  worked on ramp and curb step, see below.     Ambulation Distance (Feet) 300 Feet   Assistive device Large base quad cane   Gait Pattern Step-through pattern;Decreased stride length;Decreased hip/knee flexion - right;Trunk flexed;Narrow base of support   Ambulation Surface Level;Indoor   Stairs Yes   Stairs Assistance 5: Supervision;4: Min guard   Stairs Assistance Details (indicate cue type and reason) Worked on negotiating stairs using quad cane.  Had pt use LUE to place cane up several steps, ascend then place up another step.  Tried to have to use RUE, however this was not as successful, especially when descending stairs.  Will continue to practice but feel that she  needs assistance with cane when descending.     Stair Management Technique One rail Left;Step to pattern;Alternating pattern;Forwards;With cane   Number of Stairs 4  x2 reps   Height of Stairs 6   Ramp 5: Supervision   Ramp Details (indicate cue type and reason) x 2 reps   Curb 4: Min assist   Curb Details (indicate cue type and reason) cues for sequencing and technique with min/guard to steady.    x 2 reps   Gait Comments Provided okay for pt to begin gait at home with [facility] with S from family only and RW when outdoors.  Pt verbalized understanding.       Neuro Re-ed    Neuro Re-ed Details  Worked on short distance gait in // bars without UE support to better assess balance and safety as she states she sometimes ambulates at home without device (short distance only).  Performed 4 laps down and back in // bars with min/guard A with cues for posture.  Note she tends to have increased gait compensations without device and therefore encouraged her to always have cane at this time.                 PT Education - 10/22/16 1112    Education provided Yes   Education Details Education on use of LBQC at home   Person(s) Educated Patient   Methods Explanation   Comprehension Verbalized understanding          PT Short Term Goals - 10/09/16 1026      PT SHORT TERM GOAL #1   Title Pt will be independent with assistance from caregiver and verbalize understand of initial HEP to further progress toward her goals. (Target Date for all STGs: 10/05/16)   Baseline 10/09/16 PT verbalized understand of HEP and states she performs 3x per week   Time 4   Period Weeks   Status Achieved     PT SHORT TERM GOAL #2   Title Pt will improve Berg Balance score to > or = 26/56 to indicate an improvement in static balance.   Baseline 23/56 on 10/01/16   Time 4   Period Weeks   Status Not Met     PT SHORT TERM GOAL #3   Title Pt will improve TUG time to < or = 32.5 sec indicating an improvemet in  functional mobility and balance.   Baseline 32.00 seconds with RW and HO at S level on 10/01/16   Time 4   Period Weeks   Status Achieved     PT SHORT TERM GOAL #4   Title Pt will improve initial gait speed by 0.6 ft/sec to indicate improvement in functional mobility and functional status.   Baseline 2.44 ft/sec on 10/01/16 (up from 2.03 ft/sec with min/guard)  Time 4   Period Weeks   Status Partially Met     PT SHORT TERM GOAL #5   Title Pt will ambulate 258f with LRAD and supervision indoors over level surfaces including carpet and hard flors to incr ability to safely ambulate at home.   Baseline 10/09/16 Pt ambulated 220 feet before needing a break due to fatigue.   Time 4   Period Weeks   Status Partially Met     PT SHORT TERM GOAL #6   Title Pt will negotiate 4 steps with B UE support and supervision to improve pt's ability to safely enter/exit her house.   Baseline 10/09/16 Patient negociated 4 steps with L UE for support w/ supervision.   Time 4   Period Weeks   Status Achieved           PT Long Term Goals - 09/07/16 1354      PT LONG TERM GOAL #1   Title Pt will be independent and verbalize understanding of HEP an on-going fitness plan to maintain progress made in therapy and improve overall health. (Target Date for all LTGs: 11/02/16)   Time 8   Period Weeks   Status New     PT LONG TERM GOAL #2   Title Pt will improve Berg Balance score to > or = 29/56 to indicate an improvement in static balance and incr safety when performing ADLs.   Time 8   Period Weeks   Status New     PT LONG TERM GOAL #3   Title Pt will improve TUG time to < or = 29.5 sec to indicate an incr in functional mobility and balance when performing ADLs.   Time 8   Period Weeks   Status New     PT LONG TERM GOAL #4   Title Pt will improve gait speed from baseline by 1.2 ft/sec to indicate an incr in functional mobility and safety.   Time 8   Period Weeks   Status New     PT LONG  TERM GOAL #5   Title Pt will ambulate 5033fwith LRAD and supervision outdoors over level and unlevel paved surfaces, ramps, and curbs to incr functional mobility and safety while ambulating in the community.   Time 8   Period Weeks   Status New     Additional Long Term Goals   Additional Long Term Goals Yes     PT LONG TERM GOAL #6   Title Pt will negotiate 8 steps with supervision and unilat UE support to incr safety while entering/exiting family/friend's home.   Time 8   Period Weeks   Status New               Plan - 10/22/16 1113    Clinical Impression Statement Skilled session [facility] to improved balance and pts goal to increase independence with gait with LRAD.  Feel that she is safe to ambulate at home with [facility] at this time, however recommend RW for further distances and outdoor gait.  Pt verbalized understanding.     Rehab Potential Fair   Clinical Impairments Affecting Rehab Potential Chronic stroke status, active ovarian CA, HTN, expressive aphasia,    PT Frequency 2x / week   PT Duration 8 weeks   PT Treatment/Interventions ADLs/Self Care Home Management;Functional mobility training;Stair training;Gait training;DME Instruction;Therapeutic activities;Therapeutic exercise;Balance training;Neuromuscular re-education;Patient/family education;Orthotic Fit/Training;Manual techniques;Energy conservation   PT Next Visit Plan gait with LBQC vs quad tip  vs SPC? work towards [employer].    Consulted and Agree with Plan of Care Patient      Patient will benefit from skilled therapeutic intervention in order to improve the following deficits and impairments:  Abnormal gait, Cardiopulmonary status limiting activity, Decreased activity tolerance, Decreased balance, Decreased mobility, Decreased knowledge of use of DME, Decreased knowledge of precautions, Decreased endurance, Decreased safety awareness, Decreased strength, Impaired perceived functional ability,  Impaired flexibility, Improper body mechanics, Impaired UE functional use  Visit Diagnosis: Hemiplegia and hemiparesis following cerebral infarction affecting right non-dominant side (HCC)  Unsteadiness on feet  Muscle weakness (generalized)  Other abnormalities of gait and mobility     Problem List Patient Active Problem List   Diagnosis Date Noted  . Genetic testing 08/17/2016  . Leukopenia due to antineoplastic chemotherapy ([place]) 06/05/2016  . Costochondritis, acute 05/02/2016  . Ovarian cancer ([place]) 03/24/2016  . Anemia due to antineoplastic chemotherapy 03/17/2016  . Port catheter in place 03/17/2016  . Portacath in place 02/05/2016  . Chemotherapy-induced peripheral neuropathy ([place]) 02/05/2016  . Hematoma 01/27/2016  . Chronic anticoagulation 01/22/2016  . Chronic atrial fibrillation ([place]) 01/22/2016  . PICC (peripherally inserted central catheter) in place 01/22/2016  . Chemotherapy induced neutropenia ([place]) 01/22/2016  . Poor venous access 01/22/2016  . CVA (cerebral vascular accident) ([place]) 12/27/2015  . Right hemiparesis ([place]) 12/27/2015  . Expressive aphasia 12/27/2015  . History of left breast cancer 12/27/2015  . H/O tubal ligation 12/27/2015  . Colon polyps 12/27/2015  . International Federation of Gynecology and Obstetrics (FIGO) stage IVB epithelial ovarian cancer ([place]) 12/27/2015  . Ovarian cancer, bilateral ([place]) 12/24/2015  . Inguinal adenopathy   . Carcinomatosis ([place]) 12/06/2015  . Essential hypertension, benign 07/18/2014  . Atrial fibrillation, unspecified 07/18/2014  . Anxiety state, unspecified 07/18/2014    Cameron Sprang, PT, MPT [facility] [address] [place], [place], [address] Phone: [phone]   Fax:  [phone] 10/22/16, 3:22 PM  Name: Arushi Partridge MRN: [id number] Date of Birth: [birth year]

## 2016-10-22 NOTE — Therapy (Signed)
[facility] [address] [place], [place], [address] Phone: [phone]   Fax:  [phone]  Speech Language Pathology Treatment  Patient Details  Name: Brenda Cunningham MRN: [id number] Date of Birth: Feb 01, 1951 Referring Provider: Darra Lis, M.D.  Encounter Date: 10/22/2016      End of Session - 10/22/16 1101    Visit Number 9   Number of Visits 17   Date for SLP Re-Evaluation 12/04/16   SLP Start Time 1018   SLP Stop Time  1101   SLP Time Calculation (min) 43 min   Activity Tolerance Patient tolerated treatment well      Past Medical History:  Diagnosis Date  . A-fib ([place])   . Anxiety   . Arthritis   . Breast cancer ([place]) 2008   s/p lumpectomy, chemo and radiation therapy  . Complication of anesthesia    DIFFICULTY WAKING UP FROM ANESTHESIA  . Hyperlipidemia   . Hypertension   . Stroke ([place]) 07/29/2015  . Wears glasses     Past Surgical History:  Procedure Laterality Date  . APPENDECTOMY    . BREAST LUMPECTOMY  2008   left  . COLONOSCOPY  2004  . DEBULKING N/A 03/24/2016   Procedure: RADICAL TUMOR DEBULKING;  Surgeon: Everitt Amber, MD;  Location: [facility];  Service: Gynecology;  Laterality: N/A;  . OMENTECTOMY N/A 03/24/2016   Procedure: OMENTECTOMY;  Surgeon: Everitt Amber, MD;  Location: [facility];  Service: Gynecology;  Laterality: N/A;  . ROBOTIC ASSISTED TOTAL HYSTERECTOMY WITH BILATERAL SALPINGO OOPHERECTOMY Bilateral 03/24/2016   Procedure: XI ROBOTIC ASSISTED TOTA LAPAROSCOPIC  HYSTERECTOMY WITH BILATERAL SALPINGO OOPHORECTOMY;  Surgeon: Everitt Amber, MD;  Location: [facility];  Service: Gynecology;  Laterality: Bilateral;  . TUBAL LIGATION      There were no vitals filed for this visit.      Subjective Assessment - 10/22/16 1022    Subjective "I had to do the little words" re: homework                ADULT SLP TREATMENT - 10/22/16 1023      General Information   Behavior/Cognition  Alert;Cooperative;Pleasant mood     Treatment Provided   Treatment provided Cognitive-Linquistic     Cognitive-Linquistic Treatment   Treatment focused on Aphasia   Skilled Treatment Facilitated compensations for aphasia/apraxia using slow rate describing objects/pictures with  usual min cues for semantic paraphasias and apraxic errors. Pt corrrects errors with repetition and verbal cues. Pt required occasional min semantic and phonemic cues for divergent naming tasks. Simple conversation with occasional min cues to slow rate and to repeat due to reduced intellgilbity. With cues and slow rate, intelligiblity improves.      Assessment / Recommendations / Plan   Plan Continue with current plan of care     Progression Toward Goals   Progression toward goals Progressing toward goals            SLP Short Term Goals - 10/22/16 1100      SLP SHORT TERM GOAL #1   Title pt will generate appropriate language to participate in functional 10 minutes mod complex converastion with occasional  min A   Time 1   Period Weeks   Status Not Met     SLP SHORT TERM GOAL #2   Title pt will generate sentence level responses in structured tasks with 90% success over three sessions    Baseline 10-15-16, 10-19-16   Time 1   Period Weeks  Status Not Met     SLP SHORT TERM GOAL #3   Title pt will demo understanding of 10 minutes mod-complex conversation with rare min A   Status Achieved     SLP SHORT TERM GOAL #4   Title pt will attempt multimodal communication PRN in mod complex conversation   Time 1   Period Weeks   Status Not Met          SLP Long Term Goals - 10/22/16 1100      SLP LONG TERM GOAL #1   Title pt will engage in mod complex-complex conversation functionally with modified independence (multimodal communication, compensations, extra time) over 3 sessions   Time 4   Period Weeks   Status On-going     SLP LONG TERM GOAL #2   Title pt will demo understanding of mod  complex-complex conversation with modified independence   Time 4   Period Weeks   Status On-going          Plan - 10/22/16 1054    Clinical Impression Statement Pt continues to require ongoing cueing to use slow rate to improve intellgibility and semantic/phonemic cues for naming and descriptive language. Continue skilled ST to maximize language and intellgiblity for effective commumication.Marland Kitchen   Speech Therapy Frequency 2x / week   Treatment/Interventions Language facilitation;Internal/external aids;SLP instruction and feedback;Multimodal communcation approach;Functional tasks;Patient/family education;Compensatory strategies   Potential to Achieve Goals Good   Potential Considerations Severity of impairments   Consulted and Agree with Plan of Care Patient      Patient will benefit from skilled therapeutic intervention in order to improve the following deficits and impairments:   Aphasia  Verbal apraxia    Problem List Patient Active Problem List   Diagnosis Date Noted  . Genetic testing 08/17/2016  . Leukopenia due to antineoplastic chemotherapy ([place]) 06/05/2016  . Costochondritis, acute 05/02/2016  . Ovarian cancer ([place]) 03/24/2016  . Anemia due to antineoplastic chemotherapy 03/17/2016  . Port catheter in place 03/17/2016  . Portacath in place 02/05/2016  . Chemotherapy-induced peripheral neuropathy ([place]) 02/05/2016  . Hematoma 01/27/2016  . Chronic anticoagulation 01/22/2016  . Chronic atrial fibrillation ([place]) 01/22/2016  . PICC (peripherally inserted central catheter) in place 01/22/2016  . Chemotherapy induced neutropenia ([place]) 01/22/2016  . Poor venous access 01/22/2016  . CVA (cerebral vascular accident) ([place]) 12/27/2015  . Right hemiparesis ([place]) 12/27/2015  . Expressive aphasia 12/27/2015  . History of left breast cancer 12/27/2015  . H/O tubal ligation 12/27/2015  . Colon polyps 12/27/2015  . International Federation of Gynecology and Obstetrics (FIGO) stage  IVB epithelial ovarian cancer ([place]) 12/27/2015  . Ovarian cancer, bilateral ([place]) 12/24/2015  . Inguinal adenopathy   . Carcinomatosis ([place]) 12/06/2015  . Essential hypertension, benign 07/18/2014  . Atrial fibrillation, unspecified 07/18/2014  . Anxiety state, unspecified 07/18/2014    Christionna Poland, Annye Rusk MS, CCC-SLP 10/22/2016, 11:01 AM  [facility] [address] [place], [place], [address] Phone: [phone]   Fax:  [phone]   Name: Brenda Cunningham MRN: [id number] Date of Birth: [birth year]

## 2016-10-26 ENCOUNTER — Ambulatory Visit: Payer: Medicare Other | Admitting: *Deleted

## 2016-10-26 ENCOUNTER — Encounter: Payer: Medicare Other | Admitting: Occupational Therapy

## 2016-10-26 ENCOUNTER — Ambulatory Visit: Payer: Medicare Other | Admitting: Physical Therapy

## 2016-10-27 ENCOUNTER — Ambulatory Visit: Payer: Medicare Other | Admitting: Physical Therapy

## 2016-10-27 ENCOUNTER — Encounter: Payer: Medicare Other | Admitting: Occupational Therapy

## 2016-10-27 ENCOUNTER — Ambulatory Visit: Payer: Medicare Other | Admitting: Speech Pathology

## 2016-11-02 ENCOUNTER — Encounter: Payer: Medicare Other | Admitting: Occupational Therapy

## 2016-11-02 ENCOUNTER — Ambulatory Visit: Payer: Medicare Other | Admitting: Rehabilitation

## 2016-11-02 ENCOUNTER — Ambulatory Visit: Payer: Medicare Other

## 2016-11-05 ENCOUNTER — Encounter: Payer: Medicare Other | Admitting: Occupational Therapy

## 2016-11-05 ENCOUNTER — Ambulatory Visit: Payer: Medicare Other | Admitting: Rehabilitation

## 2016-11-05 ENCOUNTER — Ambulatory Visit: Payer: Medicare Other

## 2016-11-10 ENCOUNTER — Ambulatory Visit: Payer: Medicare Other | Attending: Family Medicine | Admitting: Speech Pathology

## 2016-11-10 DIAGNOSIS — R2681 Unsteadiness on feet: Secondary | ICD-10-CM | POA: Insufficient documentation

## 2016-11-10 DIAGNOSIS — I69353 Hemiplegia and hemiparesis following cerebral infarction affecting right non-dominant side: Secondary | ICD-10-CM | POA: Insufficient documentation

## 2016-11-10 DIAGNOSIS — R2689 Other abnormalities of gait and mobility: Secondary | ICD-10-CM | POA: Insufficient documentation

## 2016-11-10 DIAGNOSIS — R4701 Aphasia: Secondary | ICD-10-CM | POA: Insufficient documentation

## 2016-11-10 DIAGNOSIS — M6281 Muscle weakness (generalized): Secondary | ICD-10-CM | POA: Insufficient documentation

## 2016-11-10 DIAGNOSIS — R482 Apraxia: Secondary | ICD-10-CM | POA: Insufficient documentation

## 2016-11-12 ENCOUNTER — Ambulatory Visit: Payer: Medicare Other

## 2016-11-13 ENCOUNTER — Encounter: Payer: Self-pay | Admitting: Gynecologic Oncology

## 2016-11-16 ENCOUNTER — Ambulatory Visit (HOSPITAL_BASED_OUTPATIENT_CLINIC_OR_DEPARTMENT_OTHER): Payer: Medicare Other

## 2016-11-16 DIAGNOSIS — C563 Malignant neoplasm of bilateral ovaries: Secondary | ICD-10-CM

## 2016-11-16 DIAGNOSIS — C561 Malignant neoplasm of right ovary: Secondary | ICD-10-CM

## 2016-11-16 DIAGNOSIS — C562 Malignant neoplasm of left ovary: Secondary | ICD-10-CM

## 2016-11-16 DIAGNOSIS — Z452 Encounter for adjustment and management of vascular access device: Secondary | ICD-10-CM

## 2016-11-16 DIAGNOSIS — Z95828 Presence of other vascular implants and grafts: Secondary | ICD-10-CM

## 2016-11-16 MED ORDER — SODIUM CHLORIDE 0.9 % IJ SOLN
10.0000 mL | INTRAMUSCULAR | Status: DC | PRN
  Administered 2016-11-16: 10 mL via INTRAVENOUS
  Filled 2016-11-16: qty 10

## 2016-11-16 MED ORDER — HEPARIN SOD (PORK) LOCK FLUSH 100 UNIT/ML IV SOLN
500.0000 [IU] | Freq: Once | INTRAVENOUS | Status: AC | PRN
  Administered 2016-11-16: 500 [IU] via INTRAVENOUS
  Filled 2016-11-16: qty 5

## 2016-11-17 ENCOUNTER — Other Ambulatory Visit: Payer: Self-pay | Admitting: Oncology

## 2016-11-17 ENCOUNTER — Ambulatory Visit: Payer: Medicare Other | Admitting: Speech Pathology

## 2016-11-17 DIAGNOSIS — R4701 Aphasia: Secondary | ICD-10-CM | POA: Diagnosis not present

## 2016-11-17 DIAGNOSIS — R482 Apraxia: Secondary | ICD-10-CM

## 2016-11-17 DIAGNOSIS — R2681 Unsteadiness on feet: Secondary | ICD-10-CM | POA: Diagnosis not present

## 2016-11-17 DIAGNOSIS — M6281 Muscle weakness (generalized): Secondary | ICD-10-CM | POA: Diagnosis not present

## 2016-11-17 DIAGNOSIS — I69353 Hemiplegia and hemiparesis following cerebral infarction affecting right non-dominant side: Secondary | ICD-10-CM | POA: Diagnosis not present

## 2016-11-17 DIAGNOSIS — R2689 Other abnormalities of gait and mobility: Secondary | ICD-10-CM | POA: Diagnosis not present

## 2016-11-17 NOTE — Therapy (Signed)
[facility] [address] [place], [place], [address] Phone: [phone]   Fax:  [phone]  Speech Language Pathology Treatment  Patient Details  Name: Brenda Cunningham MRN: [id number] Date of Birth: [birth year] Referring Provider: Darra Lis, M.D.  Encounter Date: 11/17/2016      End of Session - 11/17/16 1156    Visit Number 10   Number of Visits 17   Date for SLP Re-Evaluation 12/08/16   SLP Start Time 1016   SLP Stop Time  1100   SLP Time Calculation (min) 44 min   Activity Tolerance Patient tolerated treatment well      Past Medical History:  Diagnosis Date  . A-fib ([place])   . Anxiety   . Arthritis   . Breast cancer ([place]) 2008   s/p lumpectomy, chemo and radiation therapy  . Complication of anesthesia    DIFFICULTY WAKING UP FROM ANESTHESIA  . Hyperlipidemia   . Hypertension   . Stroke ([place]) 07/29/2015  . Wears glasses     Past Surgical History:  Procedure Laterality Date  . APPENDECTOMY    . BREAST LUMPECTOMY  2008   left  . COLONOSCOPY  2004  . DEBULKING N/A 03/24/2016   Procedure: RADICAL TUMOR DEBULKING;  Surgeon: Everitt Amber, MD;  [facility];  Service: Gynecology;  Laterality: N/A;  . OMENTECTOMY N/A 03/24/2016   Procedure: OMENTECTOMY;  Surgeon: Everitt Amber, MD;  [facility];  Service: Gynecology;  Laterality: N/A;  . ROBOTIC ASSISTED TOTAL HYSTERECTOMY WITH BILATERAL SALPINGO OOPHERECTOMY Bilateral 03/24/2016   Procedure: XI ROBOTIC ASSISTED TOTA LAPAROSCOPIC  HYSTERECTOMY WITH BILATERAL SALPINGO OOPHORECTOMY;  Surgeon: Everitt Amber, MD;  [facility];  Service: Gynecology;  Laterality: Bilateral;  . TUBAL LIGATION      There were no vitals filed for this visit.      Subjective Assessment - 11/17/16 1021    Subjective "I went to [place] - I came back with my daughter"                ADULT SLP TREATMENT - 11/17/16 1021      General Information    Behavior/Cognition Alert;Cooperative;Pleasant mood     Treatment Provided   Treatment provided Cognitive-Linquistic     Cognitive-Linquistic Treatment   Treatment focused on Aphasia;Apraxia   Skilled Treatment "I got up and made a speech and I didn't cry"  Pt has been in [place] for son's funeral visiting family  - last session was 10/23/16.  She reports that she told her family members "I told everybody I have to talk slow."   Facilitated slow rate and compensations for aphasia in structued description tasks with instruction to correct apraxic errors - she required occasional min cues to ID errors- occasional min questioning cues to generate accurate descriptive. Simple conversation 85% intelligible when topic is known and pt uses slow rate..     Assessment / Recommendations / Plan   Plan Continue with current plan of care     Progression Toward Goals   Progression toward goals Progressing toward goals            SLP Short Term Goals - 11/17/16 1155      SLP SHORT TERM GOAL #1   Title pt will generate appropriate language to participate in functional 10 minutes mod complex converastion with occasional  min A   Time 1   Period Weeks   Status Not Met     SLP SHORT TERM GOAL #2  Title pt will generate sentence level responses in structured tasks with 90% success over three sessions    Baseline 10-15-16, 10-19-16   Time 1   Period Weeks   Status Not Met     SLP SHORT TERM GOAL #3   Title pt will demo understanding of 10 minutes mod-complex conversation with rare min A   Status Achieved     SLP SHORT TERM GOAL #4   Title pt will attempt multimodal communication PRN in mod complex conversation   Time 1   Period Weeks   Status Not Met          SLP Long Term Goals - Dec 15, 2016 1155      SLP LONG TERM GOAL #1   Title pt will engage in mod complex-complex conversation functionally with modified independence (multimodal communication, compensations, extra time) over 3  sessions   Time 3   Period Weeks   Status On-going     SLP LONG TERM GOAL #2   Title pt will demo understanding of mod complex-complex conversation with modified independence   Time 3   Period Weeks   Status On-going    Speech Therapy Progress Note  Dates of Reporting Period: 09/19/16 to December 15, 2016  Objective Reports of Subjective Statement: Pt judged to be 80% intelligible in simple conversation in quiet environment. Min questioning cues required to clarify conversational message, min to mod verbal cues to ID aphasic/apraxic errors  Objective Measurements: See goals above  Goal Update: Continue with Long term goals above  Plan: Continue skilled ST POC  Reason Skilled Services are Required: Pt continues to exhibit reduced intelligibility (apraxia), word finding and paraphasias (aphasia) which affect her ability to communicate at conversational level       Plan - 12-15-16 1057    Clinical Impression Statement Pt's son died - she took 3 weeks off of therpy - last visit prior to this was 10/22/16. I extended her date for re-eval. Pt continues to require min cues for slow rate and for awareness & correction of apraxic errors. Continue skilled ST to maximize verbal expression and intellgiblity for improved independence.    Speech Therapy Frequency 2x / week   Duration --  3 weeks or 7 more visits   Treatment/Interventions Language facilitation;Internal/external aids;SLP instruction and feedback;Multimodal communcation approach;Functional tasks;Patient/family education;Compensatory strategies      Patient will benefit from skilled therapeutic intervention in order to improve the following deficits and impairments:   Aphasia  Verbal apraxia      G-Codes - 12/15/2016 1158    Functional Assessment Tool Used noms - 5 (45% impaired)   Functional Limitations Spoken language expressive   Spoken Language Expression Current Status [phone]) At least 20 percent but less than 40 percent  impaired, limited or restricted   Spoken Language Expression Goal Status ([id number]) At least 20 percent but less than 40 percent impaired, limited or restricted      Problem List Patient Active Problem List   Diagnosis Date Noted  . Genetic testing 08/17/2016  . Leukopenia due to antineoplastic chemotherapy ([place]) 06/05/2016  . Costochondritis, acute 05/02/2016  . Ovarian cancer ([place]) 03/24/2016  . Anemia due to antineoplastic chemotherapy 03/17/2016  . Port catheter in place 03/17/2016  . Portacath in place 02/05/2016  . Chemotherapy-induced peripheral neuropathy ([place]) 02/05/2016  . Hematoma 01/27/2016  . Chronic anticoagulation 01/22/2016  . Chronic atrial fibrillation ([place]) 01/22/2016  . PICC (peripherally inserted central catheter) in place 01/22/2016  . Chemotherapy induced neutropenia ([place]) 01/22/2016  . Poor  venous access 01/22/2016  . CVA (cerebral vascular accident) ([place]) 12/27/2015  . Right hemiparesis ([place]) 12/27/2015  . Expressive aphasia 12/27/2015  . History of left breast cancer 12/27/2015  . H/O tubal ligation 12/27/2015  . Colon polyps 12/27/2015  . International Federation of Gynecology and Obstetrics (FIGO) stage IVB epithelial ovarian cancer ([place]) 12/27/2015  . Ovarian cancer, bilateral ([place]) 12/24/2015  . Inguinal adenopathy   . Carcinomatosis ([place]) 12/06/2015  . Essential hypertension, benign 07/18/2014  . Atrial fibrillation, unspecified 07/18/2014  . Anxiety state, unspecified 07/18/2014    Lovvorn, Annye Rusk MS, CCC-SLP 11/17/2016, 11:58 AM  [facility] [address] [place], [place], [address] Phone: [phone]   Fax:  [phone]   Name: Armilda Vanderlinden MRN: [id number] Date of Birth: [birth year]

## 2016-11-19 ENCOUNTER — Ambulatory Visit: Payer: Medicare Other | Admitting: Speech Pathology

## 2016-11-19 DIAGNOSIS — R4701 Aphasia: Secondary | ICD-10-CM | POA: Diagnosis not present

## 2016-11-19 DIAGNOSIS — R482 Apraxia: Secondary | ICD-10-CM | POA: Diagnosis not present

## 2016-11-19 DIAGNOSIS — M6281 Muscle weakness (generalized): Secondary | ICD-10-CM | POA: Diagnosis not present

## 2016-11-19 DIAGNOSIS — I69353 Hemiplegia and hemiparesis following cerebral infarction affecting right non-dominant side: Secondary | ICD-10-CM | POA: Diagnosis not present

## 2016-11-19 DIAGNOSIS — R2689 Other abnormalities of gait and mobility: Secondary | ICD-10-CM | POA: Diagnosis not present

## 2016-11-19 DIAGNOSIS — R2681 Unsteadiness on feet: Secondary | ICD-10-CM | POA: Diagnosis not present

## 2016-11-19 NOTE — Therapy (Signed)
[facility] [address] [place], [place], [address] Phone: [phone]   Fax:  [phone]  Speech Language Pathology Treatment  Patient Details  Name: Brenda Cunningham MRN: [id number] Date of Birth: Feb 17, 1951 Referring Provider: Darra Lis, M.D.  Encounter Date: 11/19/2016      End of Session - 11/19/16 1059    Visit Number 11   Number of Visits 17   Date for SLP Re-Evaluation 12/08/16   SLP Start Time 1016   SLP Stop Time  1100   SLP Time Calculation (min) 44 min      Past Medical History:  Diagnosis Date  . A-fib ([place])   . Anxiety   . Arthritis   . Breast cancer ([place]) 2008   s/p lumpectomy, chemo and radiation therapy  . Complication of anesthesia    DIFFICULTY WAKING UP FROM ANESTHESIA  . Hyperlipidemia   . Hypertension   . Stroke ([place]) 07/29/2015  . Wears glasses     Past Surgical History:  Procedure Laterality Date  . APPENDECTOMY    . BREAST LUMPECTOMY  2008   left  . COLONOSCOPY  2004  . DEBULKING N/A 03/24/2016   Procedure: RADICAL TUMOR DEBULKING;  Surgeon: Everitt Amber, MD;  [facility];  Service: Gynecology;  Laterality: N/A;  . OMENTECTOMY N/A 03/24/2016   Procedure: OMENTECTOMY;  Surgeon: Everitt Amber, MD;  [facility];  Service: Gynecology;  Laterality: N/A;  . ROBOTIC ASSISTED TOTAL HYSTERECTOMY WITH BILATERAL SALPINGO OOPHERECTOMY Bilateral 03/24/2016   Procedure: XI ROBOTIC ASSISTED TOTA LAPAROSCOPIC  HYSTERECTOMY WITH BILATERAL SALPINGO OOPHORECTOMY;  Surgeon: Everitt Amber, MD;  [facility];  Service: Gynecology;  Laterality: Bilateral;  . TUBAL LIGATION      There were no vitals filed for this visit.      Subjective Assessment - 11/19/16 1013    Subjective "I got it at "[employer] for [facility]"               ADULT SLP TREATMENT - 11/19/16 1013      General Information   Behavior/Cognition Alert;Cooperative;Pleasant mood     Treatment Provided   Treatment provided Cognitive-Linquistic     Pain Assessment   Pain Assessment No/denies pain     Cognitive-Linquistic Treatment   Treatment focused on Aphasia;Apraxia   Skilled Treatment Facilitated slow rate and linguistic flexibility generating verbal sentences demonstrating 2-3 meanings of simple, high frequency words. Pt instructed to correct apraxic/aphasic errors as needed. Pt required usual mod A to ID 2nd meaning of word. Consonant clusters consistently in error - consistent mod A for placement cues, modeling and instruction for consonant cluster productions.  Simple convesation over 10 minutes re: her dogs with 85% intelligibility with 1 request for repair.     Assessment / Recommendations / Plan   Plan Continue with current plan of care     Progression Toward Goals   Progression toward goals Progressing toward goals            SLP Short Term Goals - 11/19/16 1042      SLP SHORT TERM GOAL #1   Title pt will generate appropriate language to participate in functional 10 minutes mod complex converastion with occasional  min A   Time 1   Period Weeks   Status Not Met     SLP SHORT TERM GOAL #2   Title pt will generate sentence level responses in structured tasks with 90% success over three sessions    Baseline 10-15-16, 10-19-16 , 11/19/16  Time 1   Period Weeks   Status Achieved     SLP SHORT TERM GOAL #3   Title pt will demo understanding of 10 minutes mod-complex conversation with rare min A   Status Achieved     SLP SHORT TERM GOAL #4   Title pt will attempt multimodal communication PRN in mod complex conversation   Time 1   Period Weeks   Status Not Met          SLP Long Term Goals - 11/19/16 1043      SLP LONG TERM GOAL #1   Title pt will engage in mod complex-complex conversation functionally with modified independence (multimodal communication, compensations, extra time) over 3 sessions   Time 3   Period Weeks   Status On-going     SLP LONG TERM  GOAL #2   Title pt will demo understanding of mod complex-complex conversation with modified independence   Time 3   Period Weeks   Status On-going          Plan - 11/19/16 1057    Clinical Impression Statement Pt requiring usual mod A for production of consonant clusters and affricates/fricatives in connected speech. Simple conversation effective when topic is known, multimodal communication not required today. Continue skilled ST to maximize verbal expression and intelligibility.   Treatment/Interventions Language facilitation;Internal/external aids;SLP instruction and feedback;Multimodal communcation approach;Functional tasks;Patient/family education;Compensatory strategies   Potential to Achieve Goals Good   Potential Considerations Severity of impairments   Consulted and Agree with Plan of Care Patient      Patient will benefit from skilled therapeutic intervention in order to improve the following deficits and impairments:   Aphasia  Verbal apraxia    Problem List Patient Active Problem List   Diagnosis Date Noted  . Genetic testing 08/17/2016  . Leukopenia due to antineoplastic chemotherapy ([place]) 06/05/2016  . Costochondritis, acute 05/02/2016  . Ovarian cancer ([place]) 03/24/2016  . Anemia due to antineoplastic chemotherapy 03/17/2016  . Port catheter in place 03/17/2016  . Portacath in place 02/05/2016  . Chemotherapy-induced peripheral neuropathy ([place]) 02/05/2016  . Hematoma 01/27/2016  . Chronic anticoagulation 01/22/2016  . Chronic atrial fibrillation ([place]) 01/22/2016  . PICC (peripherally inserted central catheter) in place 01/22/2016  . Chemotherapy induced neutropenia ([place]) 01/22/2016  . Poor venous access 01/22/2016  . CVA (cerebral vascular accident) ([place]) 12/27/2015  . Right hemiparesis ([place]) 12/27/2015  . Expressive aphasia 12/27/2015  . History of left breast cancer 12/27/2015  . H/O tubal ligation 12/27/2015  . Colon polyps 12/27/2015  .  International Federation of Gynecology and Obstetrics (FIGO) stage IVB epithelial ovarian cancer ([place]) 12/27/2015  . Ovarian cancer, bilateral ([place]) 12/24/2015  . Inguinal adenopathy   . Carcinomatosis ([place]) 12/06/2015  . Essential hypertension, benign 07/18/2014  . Atrial fibrillation, unspecified 07/18/2014  . Anxiety state, unspecified 07/18/2014    Jaquana Geiger, Annye Rusk MS, CCC-SLP 11/19/2016, 11:00 AM  [facility] [address] [place], [place], [address] Phone: [phone]   Fax:  [phone]   Name: Brenda Cunningham MRN: [id number] Date of Birth: 1951/04/20

## 2016-11-24 ENCOUNTER — Ambulatory Visit: Payer: Medicare Other

## 2016-11-24 DIAGNOSIS — R4701 Aphasia: Secondary | ICD-10-CM

## 2016-11-24 DIAGNOSIS — R2689 Other abnormalities of gait and mobility: Secondary | ICD-10-CM | POA: Diagnosis not present

## 2016-11-24 DIAGNOSIS — R482 Apraxia: Secondary | ICD-10-CM

## 2016-11-24 DIAGNOSIS — R2681 Unsteadiness on feet: Secondary | ICD-10-CM | POA: Diagnosis not present

## 2016-11-24 DIAGNOSIS — I69353 Hemiplegia and hemiparesis following cerebral infarction affecting right non-dominant side: Secondary | ICD-10-CM | POA: Diagnosis not present

## 2016-11-24 DIAGNOSIS — M6281 Muscle weakness (generalized): Secondary | ICD-10-CM | POA: Diagnosis not present

## 2016-11-24 NOTE — Patient Instructions (Signed)
  Please complete the assigned speech therapy homework and return it to your next session.  

## 2016-11-25 NOTE — Therapy (Signed)
[facility] [address] [place], [place], [address] Phone: [phone]   Fax:  [phone]  Speech Language Pathology Treatment  Patient Details  Name: Brenda Cunningham MRN: [id number] Date of Birth: 1951-02-25 Referring Provider: Darra Lis, M.D.  Encounter Date: 11/24/2016      End of Session - 11/24/16 1222    Visit Number 12   Number of Visits 17   Date for SLP Re-Evaluation 12/08/16   SLP Start Time 1150   SLP Stop Time  1230   SLP Time Calculation (min) 40 min      Past Medical History:  Diagnosis Date  . A-fib ([place])   . Anxiety   . Arthritis   . Breast cancer ([place]) 2008   s/p lumpectomy, chemo and radiation therapy  . Complication of anesthesia    DIFFICULTY WAKING UP FROM ANESTHESIA  . Hyperlipidemia   . Hypertension   . Stroke ([place]) 07/29/2015  . Wears glasses     Past Surgical History:  Procedure Laterality Date  . APPENDECTOMY    . BREAST LUMPECTOMY  2008   left  . COLONOSCOPY  2004  . DEBULKING N/A 03/24/2016   Procedure: RADICAL TUMOR DEBULKING;  Surgeon: Everitt Amber, MD;  Location: [facility];  Service: Gynecology;  Laterality: N/A;  . OMENTECTOMY N/A 03/24/2016   Procedure: OMENTECTOMY;  Surgeon: Everitt Amber, MD;  Location: [facility];  Service: Gynecology;  Laterality: N/A;  . ROBOTIC ASSISTED TOTAL HYSTERECTOMY WITH BILATERAL SALPINGO OOPHERECTOMY Bilateral 03/24/2016   Procedure: XI ROBOTIC ASSISTED TOTA LAPAROSCOPIC  HYSTERECTOMY WITH BILATERAL SALPINGO OOPHORECTOMY;  Surgeon: Everitt Amber, MD;  Location: [facility];  Service: Gynecology;  Laterality: Bilateral;  . TUBAL LIGATION      There were no vitals filed for this visit.             ADULT SLP TREATMENT - 11/24/16 1217      General Information   Behavior/Cognition Alert;Cooperative;Pleasant mood     Treatment Provided   Treatment provided Cognitive-Linquistic     Cognitive-Linquistic Treatment   Treatment focused on  Aphasia;Apraxia   Skilled Treatment SLP facilitated pt's conversational speech focusing on slower rate and other compensations for improved verbal expression. Pt required rare min A for slowed rate, and was functional in producing mod complex conversation.  However, SLP had to repeat mod copmlex questions/comments occasionally due to pt not comprehending. In structured speech tasks (mod complex locations) pt was functional as well, with slightly more artic/apraxic errors. Pt stated to SLP she is fine with discharge at the end of this month, as she is satisfied with her speech.      Assessment / Recommendations / Plan   Plan Continue with current plan of care     Progression Toward Goals   Progression toward goals Progressing toward goals            SLP Short Term Goals - 11/25/16 1706      SLP SHORT TERM GOAL #1   Title pt will generate appropriate language to participate in functional 10 minutes mod complex converastion with occasional  min A   Time 1   Period Weeks   Status Not Met     SLP SHORT TERM GOAL #2   Title pt will generate sentence level responses in structured tasks with 90% success over three sessions    Baseline 10-15-16, 10-19-16 , 11/19/16   Time 1   Period Weeks   Status Achieved     SLP SHORT TERM  GOAL #3   Title pt will demo understanding of 10 minutes mod-complex conversation with rare min A   Status Achieved     SLP SHORT TERM GOAL #4   Title pt will attempt multimodal communication PRN in mod complex conversation   Time 1   Period Weeks   Status Not Met          SLP Long Term Goals - 11/24/16 1158      SLP LONG TERM GOAL #1   Title pt will engage in mod complex-complex conversation functionally with modified independence (multimodal communication, compensations, extra time) over 3 sessions   Time 2   Period Weeks   Status On-going     SLP LONG TERM GOAL #2   Title pt will demo understanding of mod complex-complex conversation with modified  independence   Time 2   Period Weeks   Status On-going          Plan - 11/25/16 1704    Clinical Impression Statement Pt requiring min A in simple to mod complex conversation. Multimodal communication was not used nor was req'd today. Continue skilled ST for 1-2 more weeks to maximize verbal expression and intelligibility.   Speech Therapy Frequency 2x / week   Duration 2 weeks   Treatment/Interventions Language facilitation;Internal/external aids;SLP instruction and feedback;Multimodal communcation approach;Functional tasks;Patient/family education;Compensatory strategies   Potential to Achieve Goals Good   Potential Considerations Severity of impairments   Consulted and Agree with Plan of Care Patient      Patient will benefit from skilled therapeutic intervention in order to improve the following deficits and impairments:   Aphasia  Verbal apraxia    Problem List Patient Active Problem List   Diagnosis Date Noted  . Genetic testing 08/17/2016  . Leukopenia due to antineoplastic chemotherapy ([place]) 06/05/2016  . Costochondritis, acute 05/02/2016  . Ovarian cancer ([place]) 03/24/2016  . Anemia due to antineoplastic chemotherapy 03/17/2016  . Port catheter in place 03/17/2016  . Portacath in place 02/05/2016  . Chemotherapy-induced peripheral neuropathy ([place]) 02/05/2016  . Hematoma 01/27/2016  . Chronic anticoagulation 01/22/2016  . Chronic atrial fibrillation ([place]) 01/22/2016  . PICC (peripherally inserted central catheter) in place 01/22/2016  . Chemotherapy induced neutropenia ([place]) 01/22/2016  . Poor venous access 01/22/2016  . CVA (cerebral vascular accident) ([place]) 12/27/2015  . Right hemiparesis ([place]) 12/27/2015  . Expressive aphasia 12/27/2015  . History of left breast cancer 12/27/2015  . H/O tubal ligation 12/27/2015  . Colon polyps 12/27/2015  . International Federation of Gynecology and Obstetrics (FIGO) stage IVB epithelial ovarian cancer ([place]) 12/27/2015  .  Ovarian cancer, bilateral ([place]) 12/24/2015  . Inguinal adenopathy   . Carcinomatosis ([place]) 12/06/2015  . Essential hypertension, benign 07/18/2014  . Atrial fibrillation, unspecified 07/18/2014  . Anxiety state, unspecified 07/18/2014    [facility] ,[place], CCC-SLP  11/25/2016, 5:06 PM  [facility] [address] [place], [place], [address] Phone: [phone]   Fax:  [phone]   Name: Brenda Cunningham MRN: [id number] Date of Birth: Dec 26, 1950

## 2016-11-27 ENCOUNTER — Ambulatory Visit: Payer: Medicare Other

## 2016-11-27 ENCOUNTER — Encounter: Payer: Self-pay | Admitting: Rehabilitation

## 2016-11-27 ENCOUNTER — Ambulatory Visit: Payer: Medicare Other | Admitting: Rehabilitation

## 2016-11-27 DIAGNOSIS — M6281 Muscle weakness (generalized): Secondary | ICD-10-CM | POA: Diagnosis not present

## 2016-11-27 DIAGNOSIS — R482 Apraxia: Secondary | ICD-10-CM | POA: Diagnosis not present

## 2016-11-27 DIAGNOSIS — R2681 Unsteadiness on feet: Secondary | ICD-10-CM

## 2016-11-27 DIAGNOSIS — I69353 Hemiplegia and hemiparesis following cerebral infarction affecting right non-dominant side: Secondary | ICD-10-CM | POA: Diagnosis not present

## 2016-11-27 DIAGNOSIS — R2689 Other abnormalities of gait and mobility: Secondary | ICD-10-CM | POA: Diagnosis not present

## 2016-11-27 DIAGNOSIS — R4701 Aphasia: Secondary | ICD-10-CM | POA: Diagnosis not present

## 2016-11-27 NOTE — Therapy (Signed)
[facility] [address] [place], [place], [address] Phone: [phone]   Fax:  [phone]  Speech Language Pathology Treatment  Patient Details  Name: Brenda Cunningham MRN: [id number] Date of Birth: 09-May-1951 Referring Provider: Darra Lis, M.D.  Encounter Date: 11/27/2016      End of Session - 11/27/16 1330    Visit Number 13   Number of Visits 17   Date for SLP Re-Evaluation 12/08/16   SLP Start Time 0932   SLP Stop Time  8563   SLP Time Calculation (min) 43 min   Activity Tolerance Patient tolerated treatment well      Past Medical History:  Diagnosis Date  . A-fib ([place])   . Anxiety   . Arthritis   . Breast cancer ([place]) 2008   s/p lumpectomy, chemo and radiation therapy  . Complication of anesthesia    DIFFICULTY WAKING UP FROM ANESTHESIA  . Hyperlipidemia   . Hypertension   . Stroke ([place]) 07/29/2015  . Wears glasses     Past Surgical History:  Procedure Laterality Date  . APPENDECTOMY    . BREAST LUMPECTOMY  2008   left  . COLONOSCOPY  2004  . DEBULKING N/A 03/24/2016   Procedure: RADICAL TUMOR DEBULKING;  Surgeon: Everitt Amber, MD;  Location: [facility];  Service: Gynecology;  Laterality: N/A;  . OMENTECTOMY N/A 03/24/2016   Procedure: OMENTECTOMY;  Surgeon: Everitt Amber, MD;  Location: [facility];  Service: Gynecology;  Laterality: N/A;  . ROBOTIC ASSISTED TOTAL HYSTERECTOMY WITH BILATERAL SALPINGO OOPHERECTOMY Bilateral 03/24/2016   Procedure: XI ROBOTIC ASSISTED TOTA LAPAROSCOPIC  HYSTERECTOMY WITH BILATERAL SALPINGO OOPHORECTOMY;  Surgeon: Everitt Amber, MD;  Location: [facility];  Service: Gynecology;  Laterality: Bilateral;  . TUBAL LIGATION      There were no vitals filed for this visit.      Subjective Assessment - 11/27/16 0945    Subjective "I got done with the PT!"   Currently in Pain? No/denies               ADULT SLP TREATMENT - 11/27/16 0946      General Information    Behavior/Cognition Alert;Cooperative;Pleasant mood     Treatment Provided   Treatment provided Cognitive-Linquistic     Cognitive-Linquistic Treatment   Treatment focused on Aphasia;Apraxia   Skilled Treatment 10 minutes simple-mod complex conversation was completed with rare to occasional literal paraphasia/apraxic errors, pt aware approx 60% of the time. Rare SLP verbal cues to reduce rate. Pt again stated her satisfaction with progress thus far. "I just got to keep it slow down," pt remarked. In detailed picture description pt with good language with reduced rate, with rare aphasic/apraxic errors - pt aware of 90% errors and attempted to correct with 60-7-% success.      Assessment / Recommendations / Plan   Plan Continue with current plan of care  week of 11-30-16 - pt will be d/c'd     Progression Toward Goals   Progression toward goals Progressing toward goals            SLP Short Term Goals - 11/27/16 1332      SLP SHORT TERM GOAL #1   Title pt will generate appropriate language to participate in functional 10 minutes mod complex converastion with occasional  min A   Status Achieved     SLP SHORT TERM GOAL #2   Title pt will generate sentence level responses in structured tasks with 90% success over three sessions  Baseline 10-15-16, 10-19-16 , 11/19/16   Time 1   Period Weeks   Status Achieved     SLP SHORT TERM GOAL #3   Title pt will demo understanding of 10 minutes mod-complex conversation with rare min A   Status Achieved     SLP SHORT TERM GOAL #4   Title pt will attempt multimodal communication PRN in mod complex conversation   Time 1   Period Weeks   Status Not Met          SLP Long Term Goals - 11/27/16 1332      SLP LONG TERM GOAL #1   Title pt will engage in mod complex-complex conversation functionally with modified independence (multimodal communication, compensations, extra time) over 3 sessions   Time 2   Period Weeks   Status On-going      SLP LONG TERM GOAL #2   Title pt will demo understanding of mod complex-complex conversation with modified independence   Time 2   Period Weeks   Status On-going          Plan - 11/27/16 1331    Clinical Impression Statement Pt requiring min A in simple to mod complex conversation. Pt is pleased with progress and would like to continue through next week in order to maximize verbal expression and intelligibility, SLP agrees with pt for d/c next week.   Speech Therapy Frequency 2x / week   Duration 2 weeks   Treatment/Interventions Language facilitation;Internal/external aids;SLP instruction and feedback;Multimodal communcation approach;Functional tasks;Patient/family education;Compensatory strategies   Potential to Achieve Goals Good   Potential Considerations Severity of impairments   Consulted and Agree with Plan of Care Patient      Patient will benefit from skilled therapeutic intervention in order to improve the following deficits and impairments:   Aphasia  Verbal apraxia    Problem List Patient Active Problem List   Diagnosis Date Noted  . Genetic testing 08/17/2016  . Leukopenia due to antineoplastic chemotherapy ([place]) 06/05/2016  . Costochondritis, acute 05/02/2016  . Ovarian cancer ([place]) 03/24/2016  . Anemia due to antineoplastic chemotherapy 03/17/2016  . Port catheter in place 03/17/2016  . Portacath in place 02/05/2016  . Chemotherapy-induced peripheral neuropathy ([place]) 02/05/2016  . Hematoma 01/27/2016  . Chronic anticoagulation 01/22/2016  . Chronic atrial fibrillation ([place]) 01/22/2016  . PICC (peripherally inserted central catheter) in place 01/22/2016  . Chemotherapy induced neutropenia ([place]) 01/22/2016  . Poor venous access 01/22/2016  . CVA (cerebral vascular accident) ([place]) 12/27/2015  . Right hemiparesis ([place]) 12/27/2015  . Expressive aphasia 12/27/2015  . History of left breast cancer 12/27/2015  . H/O tubal ligation 12/27/2015  . Colon polyps  12/27/2015  . International Federation of Gynecology and Obstetrics (FIGO) stage IVB epithelial ovarian cancer ([place]) 12/27/2015  . Ovarian cancer, bilateral ([place]) 12/24/2015  . Inguinal adenopathy   . Carcinomatosis ([place]) 12/06/2015  . Essential hypertension, benign 07/18/2014  . Atrial fibrillation, unspecified 07/18/2014  . Anxiety state, unspecified 07/18/2014    [facility] ,[place], CCC-SLP  11/27/2016, 1:33 PM  [facility] [address] [place], [place], [address] Phone: [phone]   Fax:  [phone]   Name: Brenda Cunningham MRN: [id number] Date of Birth: May 25, 1951

## 2016-11-27 NOTE — Therapy (Signed)
[facility] [address] [place], [place], [address] Phone: [phone]   Fax:  [phone]  Physical Therapy Treatment and DC Summary  Patient Details  Name: Brenda Cunningham MRN: [id number] Date of Birth: September 23, 1951 Referring Provider: Helane Rima, MD  Encounter Date: 11/27/2016      PT End of Session - 11/27/16 0855    Visit Number 13   Number of Visits 17   Date for PT Re-Evaluation 11/06/16   Authorization Type Medicare Part A and B   Authorization Time Period G-Code every 10th visit   PT Start Time 0802   PT Stop Time 0846   PT Time Calculation (min) 44 min   Equipment Utilized During Treatment Gait belt   Activity Tolerance Patient tolerated treatment well   Behavior During Therapy [facility] for tasks assessed/performed      Past Medical History:  Diagnosis Date  . A-fib ([place])   . Anxiety   . Arthritis   . Breast cancer ([place]) 2008   s/p lumpectomy, chemo and radiation therapy  . Complication of anesthesia    DIFFICULTY WAKING UP FROM ANESTHESIA  . Hyperlipidemia   . Hypertension   . Stroke ([place]) 07/29/2015  . Wears glasses     Past Surgical History:  Procedure Laterality Date  . APPENDECTOMY    . BREAST LUMPECTOMY  2008   left  . COLONOSCOPY  2004  . DEBULKING N/A 03/24/2016   Procedure: RADICAL TUMOR DEBULKING;  Surgeon: Everitt Amber, MD;  Location: [facility];  Service: Gynecology;  Laterality: N/A;  . OMENTECTOMY N/A 03/24/2016   Procedure: OMENTECTOMY;  Surgeon: Everitt Amber, MD;  Location: [facility];  Service: Gynecology;  Laterality: N/A;  . ROBOTIC ASSISTED TOTAL HYSTERECTOMY WITH BILATERAL SALPINGO OOPHERECTOMY Bilateral 03/24/2016   Procedure: XI ROBOTIC ASSISTED TOTA LAPAROSCOPIC  HYSTERECTOMY WITH BILATERAL SALPINGO OOPHORECTOMY;  Surgeon: Everitt Amber, MD;  Location: [facility];  Service: Gynecology;  Laterality: Bilateral;  . TUBAL LIGATION      There were no vitals filed for this visit.       Subjective Assessment - 11/27/16 0808    Subjective "I got an early Christmas present."    Limitations Standing;Walking;House hold activities   Patient Stated Goals "I want to get up and see my daughter and get on the train to see my sons in [place]."   Currently in Pain? No/denies                         [facility] Adult PT Treatment/Exercise - 11/27/16 0825      Transfers   Transfers Sit to Stand;Stand to Sit   Sit to Stand 6: Modified independent (Device/Increase time)   Stand to Sit 6: Modified independent (Device/Increase time)     Ambulation/Gait   Ambulation/Gait Yes   Ambulation/Gait Assistance 5: Supervision;4: Min guard;6: Modified independent (Device/Increase time)   Ambulation/Gait Assistance Details Pt is mod I with the RW and hand orthosis.  Continue to use [facility] during session as she has been using this at home and in community with family.  She is S level with [facility] with min cues for sequencing and forward/upward gaze.  Note that she has ambulates not using a lot of support through quad cane, therefore progressed to [facility] during session with min/guard A with cues for sequencing and forward gaze.  Feel that she can progress to this at home with assist from family.  Pt verbalized understanding.     Ambulation  Distance (Feet) 115 Feet  x3 and 500' outdoors   Assistive device Rolling walker;Large base quad cane;Straight cane   Gait Pattern Step-through pattern;Decreased stride length;Decreased hip/knee flexion - right;Trunk flexed;Narrow base of support   Ambulation Surface Level;Unlevel;Indoor;Outdoor;Paved   Gait velocity 2.37 ft/sec with RW at mod I level   Stairs Yes   Stairs Assistance 6: Modified independent (Device/Increase time)   Stair Management Technique One rail Left;Alternating pattern;Forwards   Number of Stairs 4  x 2 reps   Height of Stairs 6   Curb 5: Supervision   Curb Details (indicate cue type and reason) S for safety with cane      Standardized Balance Assessment   Standardized Balance Assessment Berg Balance Test;Timed Up and Go Test     Berg Balance Test   Sit to Stand Able to stand without using hands and stabilize independently   Standing Unsupported Able to stand safely 2 minutes   Sitting with Back Unsupported but Feet Supported on Floor or Stool Able to sit safely and securely 2 minutes   Stand to Sit Sits safely with minimal use of hands   Transfers Able to transfer safely, minor use of hands   Standing Unsupported with Eyes Closed Able to stand 10 seconds with supervision   Standing Ubsupported with Feet Together Able to place feet together independently and stand for 1 minute with supervision   From Standing, Reach Forward with Outstretched Arm Can reach forward >12 cm safely (5")   From Standing Position, Pick up Object from Floor Able to pick up shoe, needs supervision   From Standing Position, Turn to Look Behind Over each Shoulder Looks behind one side only/other side shows less weight shift   Turn 360 Degrees Able to turn 360 degrees safely but slowly   Standing Unsupported, Alternately Place Feet on Step/Stool Able to complete >2 steps/needs minimal assist   Standing Unsupported, One Foot in Front Able to plae foot ahead of the other independently and hold 30 seconds   Standing on One Leg Tries to lift leg/unable to hold 3 seconds but remains standing independently   Total Score 42     Timed Up and Go Test   TUG Normal TUG   Normal TUG (seconds) 17.6                PT Education - 11/27/16 0854    Education provided Yes   Education Details Education on use of SPC at home with family   Person(s) Educated Patient   Methods Explanation   Comprehension Verbalized understanding          PT Short Term Goals - 10/09/16 1026      PT SHORT TERM GOAL #1   Title Pt will be independent with assistance from caregiver and verbalize understand of initial HEP to further progress toward her goals.  (Target Date for all STGs: 10/05/16)   Baseline 10/09/16 PT verbalized understand of HEP and states she performs 3x per week   Time 4   Period Weeks   Status Achieved     PT SHORT TERM GOAL #2   Title Pt will improve Berg Balance score to > or = 26/56 to indicate an improvement in static balance.   Baseline 23/56 on 10/01/16   Time 4   Period Weeks   Status Not Met     PT SHORT TERM GOAL #3   Title Pt will improve TUG time to < or = 32.5 sec indicating an improvemet in functional  mobility and balance.   Baseline 32.00 seconds with RW and HO at S level on 10/01/16   Time 4   Period Weeks   Status Achieved     PT SHORT TERM GOAL #4   Title Pt will improve initial gait speed by 0.6 ft/sec to indicate improvement in functional mobility and functional status.   Baseline 2.44 ft/sec on 10/01/16 (up from 2.03 ft/sec with min/guard)   Time 4   Period Weeks   Status Partially Met     PT SHORT TERM GOAL #5   Title Pt will ambulate 271f with LRAD and supervision indoors over level surfaces including carpet and hard flors to incr ability to safely ambulate at home.   Baseline 10/09/16 Pt ambulated 220 feet before needing a break due to fatigue.   Time 4   Period Weeks   Status Partially Met     PT SHORT TERM GOAL #6   Title Pt will negotiate 4 steps with B UE support and supervision to improve pt's ability to safely enter/exit her house.   Baseline 10/09/16 Patient negociated 4 steps with L UE for support w/ supervision.   Time 4   Period Weeks   Status Achieved           PT Long Term Goals - 11/27/16 0809      PT LONG TERM GOAL #1   Title Pt will be independent and verbalize understanding of HEP an on-going fitness plan to maintain progress made in therapy and improve overall health. (Target Date for all LTGs: 11/02/16)   Baseline met verbally 11/27/16   Time 8   Period Weeks   Status Achieved     PT LONG TERM GOAL #2   Title Pt will improve Berg Balance score to > or =  29/56 to indicate an improvement in static balance and incr safety when performing ADLs.   Baseline 42/56 on 11/27/16   Time 8   Period Weeks   Status Achieved     PT LONG TERM GOAL #3   Title Pt will improve TUG time to < or = 29.5 sec to indicate an incr in functional mobility and balance when performing ADLs.   Baseline 17.60 seconds with LBQC on 11/27/16   Time 8   Period Weeks   Status Achieved     PT LONG TERM GOAL #4   Title Pt will improve gait speed from baseline by 1.2 ft/sec to indicate an incr in functional mobility and safety.   Baseline 2.37 ft/sec with RW at mod I level   Time 8   Period Weeks   Status Partially Met     PT LONG TERM GOAL #5   Title Pt will ambulate 5054fwith LRAD and supervision outdoors over level and unlevel paved surfaces, ramps, and curbs to incr functional mobility and safety while ambulating in the community.   [facility]    Time 8   Period Weeks   Status Achieved     PT LONG TERM GOAL #6   Title Pt will negotiate 8 steps with supervision and unilat UE support to incr safety while entering/exiting family/friend's home.   Baseline met 11/27/16   Time 8   Period Weeks   Status Achieved               Plan - 11/27/16 0855    Clinical Impression Statement Skilled session focused on addressing LTGs and D/C as she has progressed very  well with all aspects of mobility.  She has met 5/6 LTGs and partially met remaining goal for gait speed.  She has also progressed herself to using [facility] in community with family as well and performed today at S level.  Pt agreed to D/C today.    Rehab Potential Fair   Clinical Impairments Affecting Rehab Potential Chronic stroke status, active ovarian CA, HTN, expressive aphasia,    PT Frequency 2x / week   PT Duration 8 weeks   PT Treatment/Interventions ADLs/Self Care Home Management;Functional mobility training;Stair training;Gait training;DME Instruction;Therapeutic  activities;Therapeutic exercise;Balance training;Neuromuscular re-education;Patient/family education;Orthotic Fit/Training;Manual techniques;Energy conservation   Consulted and Agree with Plan of Care Patient      Patient will benefit from skilled therapeutic intervention in order to improve the following deficits and impairments:  Abnormal gait, Cardiopulmonary status limiting activity, Decreased activity tolerance, Decreased balance, Decreased mobility, Decreased knowledge of use of DME, Decreased knowledge of precautions, Decreased endurance, Decreased safety awareness, Decreased strength, Impaired perceived functional ability, Impaired flexibility, Improper body mechanics, Impaired UE functional use  Visit Diagnosis: Hemiplegia and hemiparesis following cerebral infarction affecting right non-dominant side (HCC)  Unsteadiness on feet  Muscle weakness (generalized)  Other abnormalities of gait and mobility       G-Codes - 2016/12/06 0857    Functional Assessment Tool Used BERG: 42/56, TUG: 17.60 secs    Functional Limitation Mobility: Walking and moving around   Mobility: Walking and Moving Around Current Status [phone]) At least 20 percent but less than 40 percent impaired, limited or restricted   Mobility: Walking and Moving Around Goal Status [phone]) At least 20 percent but less than 40 percent impaired, limited or restricted   Mobility: Walking and Moving Around Discharge Status [phone]) At least 20 percent but less than 40 percent impaired, limited or restricted      PHYSICAL THERAPY DISCHARGE SUMMARY  Visits from Start of Care: 13  Current functional level related to goals / functional outcomes: See LTGs   Remaining deficits: Pt continues to have L hemi plegia (UE>LE) but has demonstrated marked improvement in balance and progressing gait with LRAD.    Education / Equipment: HEP  Plan: Patient agrees to discharge.  Patient goals were met. Patient is being discharged due  to meeting the stated rehab goals.  ?????         Problem List Patient Active Problem List   Diagnosis Date Noted  . Genetic testing 08/17/2016  . Leukopenia due to antineoplastic chemotherapy ([place]) 06/05/2016  . Costochondritis, acute 05/02/2016  . Ovarian cancer ([place]) 03/24/2016  . Anemia due to antineoplastic chemotherapy 03/17/2016  . Port catheter in place 03/17/2016  . Portacath in place 02/05/2016  . Chemotherapy-induced peripheral neuropathy ([facility]) 02/05/2016  . Hematoma 01/27/2016  . Chronic anticoagulation 01/22/2016  . Chronic atrial fibrillation ([place]) 01/22/2016  . PICC (peripherally inserted central catheter) in place 01/22/2016  . Chemotherapy induced neutropenia ([place]) 01/22/2016  . Poor venous access 01/22/2016  . CVA (cerebral vascular accident) ([place]) 12/27/2015  . Right hemiparesis ([place]) 12/27/2015  . Expressive aphasia 12/27/2015  . History of left breast cancer 12/27/2015  . H/O tubal ligation 12/27/2015  . Colon polyps 12/27/2015  . International Federation of Gynecology and Obstetrics (FIGO) stage IVB epithelial ovarian cancer ([place]) 12/27/2015  . Ovarian cancer, bilateral ([place]) 12/24/2015  . Inguinal adenopathy   . Carcinomatosis ([place]) 12/06/2015  . Essential hypertension, benign 07/18/2014  . Atrial fibrillation, unspecified 07/18/2014  . Anxiety state, unspecified 07/18/2014    Cameron Sprang, PT,  MPT [facility] [address] [place], [place], [address] Phone: [phone]   Fax:  [phone] 11/27/16, 10:22 AM  Name: Brenda Cunningham MRN: [id number] Date of Birth: 1951-09-03

## 2016-12-02 ENCOUNTER — Ambulatory Visit: Payer: Medicare Other | Admitting: Physical Therapy

## 2016-12-02 ENCOUNTER — Ambulatory Visit: Payer: Medicare Other

## 2016-12-02 DIAGNOSIS — M6281 Muscle weakness (generalized): Secondary | ICD-10-CM | POA: Diagnosis not present

## 2016-12-02 DIAGNOSIS — R482 Apraxia: Secondary | ICD-10-CM

## 2016-12-02 DIAGNOSIS — R4701 Aphasia: Secondary | ICD-10-CM

## 2016-12-02 DIAGNOSIS — I69353 Hemiplegia and hemiparesis following cerebral infarction affecting right non-dominant side: Secondary | ICD-10-CM | POA: Diagnosis not present

## 2016-12-02 DIAGNOSIS — R2689 Other abnormalities of gait and mobility: Secondary | ICD-10-CM | POA: Diagnosis not present

## 2016-12-02 DIAGNOSIS — R2681 Unsteadiness on feet: Secondary | ICD-10-CM | POA: Diagnosis not present

## 2016-12-02 NOTE — Patient Instructions (Signed)
Keep talking to people!! This is the best therapy for you.  SLOW DOWN! It will help you get the right work out, clearly. Say a word another way if it's hard for you to think of one certain word.

## 2016-12-02 NOTE — Therapy (Signed)
[facility] [address] [place], [place], [address] Phone: [phone]   Fax:  [phone]  Speech Language Pathology Treatment  Patient Details  Name: Brenda Cunningham MRN: [id number] Date of Birth: June 22, 1951 Referring Provider: Darra Lis, M.D.  Encounter Date: 12/02/2016      End of Session - 12/02/16 1009    Visit Number 14   Number of Visits 17   Date for SLP Re-Evaluation 12/08/16   SLP Start Time 0933   SLP Stop Time  1011   SLP Time Calculation (min) 38 min   Activity Tolerance Patient tolerated treatment well      Past Medical History:  Diagnosis Date  . A-fib ([place])   . Anxiety   . Arthritis   . Breast cancer ([place]) 2008   s/p lumpectomy, chemo and radiation therapy  . Complication of anesthesia    DIFFICULTY WAKING UP FROM ANESTHESIA  . Hyperlipidemia   . Hypertension   . Stroke ([place]) 07/29/2015  . Wears glasses     Past Surgical History:  Procedure Laterality Date  . APPENDECTOMY    . BREAST LUMPECTOMY  2008   left  . COLONOSCOPY  2004  . DEBULKING N/A 03/24/2016   Procedure: RADICAL TUMOR DEBULKING;  Surgeon: Everitt Amber, MD;  [facility]: [facility];  Service: Gynecology;  Laterality: N/A;  . OMENTECTOMY N/A 03/24/2016   Procedure: OMENTECTOMY;  Surgeon: Everitt Amber, MD;  Location: [facility];  Service: Gynecology;  Laterality: N/A;  . ROBOTIC ASSISTED TOTAL HYSTERECTOMY WITH BILATERAL SALPINGO OOPHERECTOMY Bilateral 03/24/2016   Procedure: XI ROBOTIC ASSISTED TOTA LAPAROSCOPIC  HYSTERECTOMY WITH BILATERAL SALPINGO OOPHORECTOMY;  Surgeon: Everitt Amber, MD;  [facility]: [facility];  Service: Gynecology;  Laterality: Bilateral;  . TUBAL LIGATION      There were no vitals filed for this visit.      Subjective Assessment - 12/02/16 0941    Subjective Pt relates her talking over Christmas was 'good'.   Currently in Pain? No/denies               ADULT SLP TREATMENT - 12/02/16 0941      General  Information   Behavior/Cognition Alert;Cooperative;Pleasant mood     Treatment Provided   Treatment provided Cognitive-Linquistic     Cognitive-Linquistic Treatment   Treatment focused on Aphasia;Apraxia   Skilled Treatment In 30 minutes simple -mod complex conversation (mostly mod complex) pt req'd min A rarely for use of compensatory strategies for speech/language. Pt req'd min A rarely for comprehension in mod complex conversation.      Assessment / Recommendations / Plan   Plan --  discharge verly likely next session     Progression Toward Goals   Progression toward goals Progressing toward goals          SLP Education - 12/02/16 1009    Education provided No          SLP Short Term Goals - 11/27/16 1332      SLP SHORT TERM GOAL #1   Title pt will generate appropriate language to participate in functional 10 minutes mod complex converastion with occasional  min A   Status Achieved     SLP SHORT TERM GOAL #2   Title pt will generate sentence level responses in structured tasks with 90% success over three sessions    Baseline 10-15-16, 10-19-16 , 11/19/16   Time 1   Period Weeks   Status Achieved     SLP SHORT TERM GOAL #3   Title  pt will demo understanding of 10 minutes mod-complex conversation with rare min A   Status Achieved     SLP SHORT TERM GOAL #4   Title pt will attempt multimodal communication PRN in mod complex conversation   Time 1   Period Weeks   Status Not Met          SLP Long Term Goals - 12/02/16 1014      SLP LONG TERM GOAL #1   Title pt will engage in mod complex-complex conversation functionally with modified independence (multimodal communication, compensations, extra time) over 3 sessions   Time 1   Period Weeks   Status On-going     SLP LONG TERM GOAL #2   Title pt will demo understanding of mod complex-complex conversation with modified independence   Time 1   Period Weeks   Status On-going          Plan - 12/02/16  1009    Clinical Impression Statement Pt requiring min A in simple to mod complex conversation. Pt beneifitted from SLP cues today to reduce rate and use synonyms, rarely necessary. Pt is pleased with progress and would like to continue through this week in order to maximize verbal expression and intelligibility, SLP agrees with pt for d/c, likely next session.    Speech Therapy Frequency 2x / week   Duration 1 week   Treatment/Interventions Language facilitation;Internal/external aids;SLP instruction and feedback;Multimodal communcation approach;Functional tasks;Patient/family education;Compensatory strategies   Potential to Achieve Goals Good   Potential Considerations Severity of impairments   Consulted and Agree with Plan of Care Patient      Patient will benefit from skilled therapeutic intervention in order to improve the following deficits and impairments:   Aphasia  Verbal apraxia    Problem List Patient Active Problem List   Diagnosis Date Noted  . Genetic testing 08/17/2016  . Leukopenia due to antineoplastic chemotherapy ([place]) 06/05/2016  . Costochondritis, acute 05/02/2016  . Ovarian cancer ([place]) 03/24/2016  . Anemia due to antineoplastic chemotherapy 03/17/2016  . Port catheter in place 03/17/2016  . Portacath in place 02/05/2016  . Chemotherapy-induced peripheral neuropathy ([place]) 02/05/2016  . Hematoma 01/27/2016  . Chronic anticoagulation 01/22/2016  . Chronic atrial fibrillation ([place]) 01/22/2016  . PICC (peripherally inserted central catheter) in place 01/22/2016  . Chemotherapy induced neutropenia ([place]) 01/22/2016  . Poor venous access 01/22/2016  . CVA (cerebral vascular accident) ([place]) 12/27/2015  . Right hemiparesis ([place]) 12/27/2015  . Expressive aphasia 12/27/2015  . History of left breast cancer 12/27/2015  . H/O tubal ligation 12/27/2015  . Colon polyps 12/27/2015  . International Federation of Gynecology and Obstetrics (FIGO) stage IVB epithelial  ovarian cancer ([place]) 12/27/2015  . Ovarian cancer, bilateral ([place]) 12/24/2015  . Inguinal adenopathy   . Carcinomatosis ([place]) 12/06/2015  . Essential hypertension, benign 07/18/2014  . Atrial fibrillation, unspecified 07/18/2014  . Anxiety state, unspecified 07/18/2014    [facility] ,[place], [place]  12/02/2016, 11:50 AM  [facility] [address] [place], [place], [address] Phone: [phone]   Fax:  [phone]   Name: Yaneth Fairbairn MRN: [id number] Date of Birth: 02-11-1951

## 2016-12-04 ENCOUNTER — Ambulatory Visit: Payer: Medicare Other | Admitting: Rehabilitation

## 2016-12-04 ENCOUNTER — Ambulatory Visit: Payer: Medicare Other

## 2016-12-04 DIAGNOSIS — M6281 Muscle weakness (generalized): Secondary | ICD-10-CM | POA: Diagnosis not present

## 2016-12-04 DIAGNOSIS — R482 Apraxia: Secondary | ICD-10-CM | POA: Diagnosis not present

## 2016-12-04 DIAGNOSIS — R4701 Aphasia: Secondary | ICD-10-CM

## 2016-12-04 DIAGNOSIS — R2681 Unsteadiness on feet: Secondary | ICD-10-CM | POA: Diagnosis not present

## 2016-12-04 DIAGNOSIS — R2689 Other abnormalities of gait and mobility: Secondary | ICD-10-CM | POA: Diagnosis not present

## 2016-12-04 DIAGNOSIS — I69353 Hemiplegia and hemiparesis following cerebral infarction affecting right non-dominant side: Secondary | ICD-10-CM | POA: Diagnosis not present

## 2016-12-04 NOTE — Therapy (Signed)
[facility] [address] [place], [place], [address] Phone: [phone]   Fax:  [phone]  Speech Language Pathology Treatment  Patient Details  Name: Brenda Cunningham MRN: [id number] Date of Birth: 1951-07-27 Referring Provider: Darra Lis, M.D.  Encounter Date: 12/04/2016      End of Session - 12/04/16 1236    Visit Number 15   Number of Visits 17   Date for SLP Re-Evaluation 12/08/16   SLP Start Time 0933   SLP Stop Time  6060   SLP Time Calculation (min) 41 min      Past Medical History:  Diagnosis Date  . A-fib ([place])   . Anxiety   . Arthritis   . Breast cancer ([place]) 2008   s/p lumpectomy, chemo and radiation therapy  . Complication of anesthesia    DIFFICULTY WAKING UP FROM ANESTHESIA  . Hyperlipidemia   . Hypertension   . Stroke ([place]) 07/29/2015  . Wears glasses     Past Surgical History:  Procedure Laterality Date  . APPENDECTOMY    . BREAST LUMPECTOMY  2008   left  . COLONOSCOPY  2004  . DEBULKING N/A 03/24/2016   Procedure: RADICAL TUMOR DEBULKING;  Surgeon: Everitt Amber, MD;  Location: [facility];  Service: Gynecology;  Laterality: N/A;  . OMENTECTOMY N/A 03/24/2016   Procedure: OMENTECTOMY;  Surgeon: Everitt Amber, MD;  Location: [facility];  Service: Gynecology;  Laterality: N/A;  . ROBOTIC ASSISTED TOTAL HYSTERECTOMY WITH BILATERAL SALPINGO OOPHERECTOMY Bilateral 03/24/2016   Procedure: XI ROBOTIC ASSISTED TOTA LAPAROSCOPIC  HYSTERECTOMY WITH BILATERAL SALPINGO OOPHORECTOMY;  Surgeon: Everitt Amber, MD;  Location: [facility];  Service: Gynecology;  Laterality: Bilateral;  . TUBAL LIGATION      There were no vitals filed for this visit.      Subjective Assessment - 12/04/16 0954    Subjective "Let's talk about stuff!" (pt, re: what she would like to do on her last day)   Currently in Pain? No/denies               ADULT SLP TREATMENT - 12/04/16 0954      General Information    Behavior/Cognition Alert;Cooperative;Pleasant mood     Treatment Provided   Treatment provided Cognitive-Linquistic     Cognitive-Linquistic Treatment   Treatment focused on Aphasia;Apraxia   Skilled Treatment In 30 minutes smod complex conversation pt req'd standby assist/was independent for use of compensatory strategies for speech/language. Pt was 95%+ successful with listener comprehension of her message. One transposition of "he" for "she" and one utterance that req'd SLP request for repeat.      Assessment / Recommendations / Plan   Plan Discharge SLP treatment due to (comment)  pt pleased wiht progress     Progression Toward Goals   Progression toward goals --  see goal update - d/c today            SLP Short Term Goals - 11/27/16 1332      SLP SHORT TERM GOAL #1   Title pt will generate appropriate language to participate in functional 10 minutes mod complex converastion with occasional  min A   Status Achieved     SLP SHORT TERM GOAL #2   Title pt will generate sentence level responses in structured tasks with 90% success over three sessions    Baseline 10-15-16, 10-19-16 , 11/19/16   Time 1   Period Weeks   Status Achieved     SLP SHORT TERM GOAL #3  Title pt will demo understanding of 10 minutes mod-complex conversation with rare min A   Status Achieved     SLP SHORT TERM GOAL #4   Title pt will attempt multimodal communication PRN in mod complex conversation   Time 1   Period Weeks   Status Not Met          SLP Long Term Goals - December 25, 2016 1237      SLP LONG TERM GOAL #1   Title pt will engage in mod complex-complex conversation functionally with modified independence (multimodal communication, compensations, extra time) over 3 sessions   Status Partially Met  one session     SLP LONG TERM GOAL #2   Title pt will demo understanding of mod complex-complex conversation with modified independence   Status Partially Met  mod complex conversation           Plan - 12-25-2016 1236    Clinical Impression Statement Pt requiring SBA-rare min A in mod complex conversation today for rate reduction/clarification of verbal message. Pt remains very pleased with progress and agrees with d/c.   Treatment/Interventions Language facilitation;Internal/external aids;SLP instruction and feedback;Multimodal communcation approach;Functional tasks;Patient/family education;Compensatory strategies   Potential to Achieve Goals Good   Potential Considerations Severity of impairments   Consulted and Agree with Plan of Care Patient      Patient will benefit from skilled therapeutic intervention in order to improve the following deficits and impairments:   Aphasia  Verbal apraxia      G-Codes - 12/25/2016 1238    Functional Assessment Tool Used noms - 5 (35-40% impaired)   Functional Limitations Spoken language expressive   Spoken Language Expression Goal Status [phone]) At least 20 percent but less than 40 percent impaired, limited or restricted   Spoken Language Expression Discharge Status [phone]) At least 20 percent but less than 40 percent impaired, limited or restricted     [place]  Visits from Start of Care: 15  Current functional level related to goals / functional outcomes: See pt's goal update above for details. Pt is satisfied with progress in therapy.   Remaining deficits: Mild-mod apraxia and aphasia, worse with faster rate of speech than when pt uses compensatory strategies to improve verbal expression.   Education / Equipment: Compensations for aphasia, apraxia.  Plan: Patient agrees to discharge.  Patient goals were partially met. Patient is being discharged due to being pleased with the current functional level.  ?????       Problem List Patient Active Problem List   Diagnosis Date Noted  . Genetic testing 08/17/2016  . Leukopenia due to antineoplastic chemotherapy ([place]) 06/05/2016  . Costochondritis,  acute 05/02/2016  . Ovarian cancer ([place]) 03/24/2016  . Anemia due to antineoplastic chemotherapy 03/17/2016  . Port catheter in place 03/17/2016  . Portacath in place 02/05/2016  . Chemotherapy-induced peripheral neuropathy ([place]) 02/05/2016  . Hematoma 01/27/2016  . Chronic anticoagulation 01/22/2016  . Chronic atrial fibrillation ([place]) 01/22/2016  . PICC (peripherally inserted central catheter) in place 01/22/2016  . Chemotherapy induced neutropenia ([place]) 01/22/2016  . Poor venous access 01/22/2016  . CVA (cerebral vascular accident) ([place]) 12/27/2015  . Right hemiparesis ([place]) 12/27/2015  . Expressive aphasia 12/27/2015  . History of left breast cancer 12/27/2015  . H/O tubal ligation 12/27/2015  . Colon polyps 12/27/2015  . International Federation of Gynecology and Obstetrics (FIGO) stage IVB epithelial ovarian cancer ([place]) 12/27/2015  . Ovarian cancer, bilateral ([place]) 12/24/2015  . Inguinal adenopathy   . Carcinomatosis (  [place]) 12/06/2015  . Essential hypertension, benign 07/18/2014  . Atrial fibrillation, unspecified 07/18/2014  . Anxiety state, unspecified 07/18/2014    [facility] ,[place], [place]  12/04/2016, 12:39 PM  [facility] [address] [place], [place], [address] Phone: [phone]   Fax:  [phone]   Name: Brenda Cunningham MRN: [id number] Date of Birth: 1951-10-20

## 2016-12-10 ENCOUNTER — Ambulatory Visit: Payer: Medicare Other | Admitting: Physical Therapy

## 2016-12-14 ENCOUNTER — Ambulatory Visit: Payer: Medicare Other | Admitting: Rehabilitation

## 2016-12-17 ENCOUNTER — Ambulatory Visit: Payer: Medicare Other | Admitting: Physical Therapy

## 2016-12-17 ENCOUNTER — Encounter: Payer: Medicare Other | Admitting: Speech Pathology

## 2016-12-17 DIAGNOSIS — E785 Hyperlipidemia, unspecified: Secondary | ICD-10-CM | POA: Insufficient documentation

## 2016-12-30 ENCOUNTER — Ambulatory Visit: Payer: Medicare Other

## 2016-12-30 ENCOUNTER — Other Ambulatory Visit: Payer: Self-pay | Admitting: Family Medicine

## 2016-12-30 ENCOUNTER — Encounter: Payer: Self-pay | Admitting: Gynecologic Oncology

## 2016-12-30 ENCOUNTER — Other Ambulatory Visit (HOSPITAL_BASED_OUTPATIENT_CLINIC_OR_DEPARTMENT_OTHER): Payer: Medicare Other

## 2016-12-30 ENCOUNTER — Ambulatory Visit: Payer: Medicare Other | Attending: Gynecologic Oncology | Admitting: Gynecologic Oncology

## 2016-12-30 VITALS — BP 131/78 | HR 61 | Temp 97.5°F | Resp 18 | Ht 65.0 in | Wt 197.2 lb

## 2016-12-30 DIAGNOSIS — Z885 Allergy status to narcotic agent status: Secondary | ICD-10-CM | POA: Diagnosis not present

## 2016-12-30 DIAGNOSIS — Z95828 Presence of other vascular implants and grafts: Secondary | ICD-10-CM

## 2016-12-30 DIAGNOSIS — Z7982 Long term (current) use of aspirin: Secondary | ICD-10-CM | POA: Diagnosis not present

## 2016-12-30 DIAGNOSIS — Z853 Personal history of malignant neoplasm of breast: Secondary | ICD-10-CM | POA: Diagnosis not present

## 2016-12-30 DIAGNOSIS — Z7901 Long term (current) use of anticoagulants: Secondary | ICD-10-CM | POA: Insufficient documentation

## 2016-12-30 DIAGNOSIS — C563 Malignant neoplasm of bilateral ovaries: Secondary | ICD-10-CM

## 2016-12-30 DIAGNOSIS — Z8543 Personal history of malignant neoplasm of ovary: Secondary | ICD-10-CM

## 2016-12-30 DIAGNOSIS — Z90722 Acquired absence of ovaries, bilateral: Secondary | ICD-10-CM | POA: Insufficient documentation

## 2016-12-30 DIAGNOSIS — I69320 Aphasia following cerebral infarction: Secondary | ICD-10-CM | POA: Insufficient documentation

## 2016-12-30 DIAGNOSIS — Z8673 Personal history of transient ischemic attack (TIA), and cerebral infarction without residual deficits: Secondary | ICD-10-CM | POA: Diagnosis not present

## 2016-12-30 DIAGNOSIS — I69351 Hemiplegia and hemiparesis following cerebral infarction affecting right dominant side: Secondary | ICD-10-CM | POA: Insufficient documentation

## 2016-12-30 DIAGNOSIS — C562 Malignant neoplasm of left ovary: Secondary | ICD-10-CM

## 2016-12-30 DIAGNOSIS — Z1231 Encounter for screening mammogram for malignant neoplasm of breast: Secondary | ICD-10-CM

## 2016-12-30 DIAGNOSIS — C569 Malignant neoplasm of unspecified ovary: Secondary | ICD-10-CM | POA: Diagnosis present

## 2016-12-30 DIAGNOSIS — I10 Essential (primary) hypertension: Secondary | ICD-10-CM | POA: Insufficient documentation

## 2016-12-30 DIAGNOSIS — Z79899 Other long term (current) drug therapy: Secondary | ICD-10-CM | POA: Insufficient documentation

## 2016-12-30 DIAGNOSIS — Z91040 Latex allergy status: Secondary | ICD-10-CM | POA: Diagnosis not present

## 2016-12-30 DIAGNOSIS — C561 Malignant neoplasm of right ovary: Secondary | ICD-10-CM | POA: Diagnosis not present

## 2016-12-30 DIAGNOSIS — Z90710 Acquired absence of both cervix and uterus: Secondary | ICD-10-CM | POA: Diagnosis not present

## 2016-12-30 DIAGNOSIS — Z923 Personal history of irradiation: Secondary | ICD-10-CM | POA: Diagnosis not present

## 2016-12-30 DIAGNOSIS — Z9221 Personal history of antineoplastic chemotherapy: Secondary | ICD-10-CM | POA: Diagnosis not present

## 2016-12-30 DIAGNOSIS — I4891 Unspecified atrial fibrillation: Secondary | ICD-10-CM | POA: Diagnosis not present

## 2016-12-30 DIAGNOSIS — E785 Hyperlipidemia, unspecified: Secondary | ICD-10-CM | POA: Diagnosis not present

## 2016-12-30 DIAGNOSIS — M1990 Unspecified osteoarthritis, unspecified site: Secondary | ICD-10-CM | POA: Diagnosis not present

## 2016-12-30 DIAGNOSIS — F419 Anxiety disorder, unspecified: Secondary | ICD-10-CM | POA: Insufficient documentation

## 2016-12-30 LAB — COMPREHENSIVE METABOLIC PANEL
ALT: 26 U/L (ref 0–55)
AST: 19 U/L (ref 5–34)
Albumin: 3.8 g/dL (ref 3.5–5.0)
Alkaline Phosphatase: 105 U/L (ref 40–150)
Anion Gap: 8 mEq/L (ref 3–11)
BUN: 14.4 mg/dL (ref 7.0–26.0)
CO2: 27 mEq/L (ref 22–29)
Calcium: 9.8 mg/dL (ref 8.4–10.4)
Chloride: 104 mEq/L (ref 98–109)
Creatinine: 0.8 mg/dL (ref 0.6–1.1)
EGFR: 88 mL/min/{1.73_m2} — ABNORMAL LOW (ref >90)
Glucose: 94 mg/dl (ref 70–140)
Potassium: 4 mEq/L (ref 3.5–5.1)
Sodium: 139 mEq/L (ref 136–145)
Total Bilirubin: 0.72 mg/dL (ref 0.20–1.20)
Total Protein: 7.3 g/dL (ref 6.4–8.3)

## 2016-12-30 LAB — CBC WITH DIFFERENTIAL/PLATELET
BASO%: 0.2 % (ref 0.0–2.0)
Basophils Absolute: 0 10*3/uL (ref 0.0–0.1)
EOS%: 1.5 % (ref 0.0–7.0)
Eosinophils Absolute: 0.1 10*3/uL (ref 0.0–0.5)
HCT: 35.3 % (ref 34.8–46.6)
HGB: 12 g/dL (ref 11.6–15.9)
LYMPH%: 44.3 % (ref 14.0–49.7)
MCH: 34.2 pg — ABNORMAL HIGH (ref 25.1–34.0)
MCHC: 34 g/dL (ref 31.5–36.0)
MCV: 100.6 fL (ref 79.5–101.0)
MONO#: 0.5 10*3/uL (ref 0.1–0.9)
MONO%: 9.6 % (ref 0.0–14.0)
NEUT#: 2.5 10*3/uL (ref 1.5–6.5)
NEUT%: 44.4 % (ref 38.4–76.8)
Platelets: 206 10*3/uL (ref 145–400)
RBC: 3.51 10*6/uL — ABNORMAL LOW (ref 3.70–5.45)
RDW: 16 % — ABNORMAL HIGH (ref 11.2–14.5)
WBC: 5.5 10*3/uL (ref 3.9–10.3)
lymph#: 2.4 10*3/uL (ref 0.9–3.3)

## 2016-12-30 MED ORDER — SODIUM CHLORIDE 0.9 % IJ SOLN
10.0000 mL | INTRAMUSCULAR | Status: DC | PRN
  Administered 2016-12-30: 10 mL via INTRAVENOUS
  Filled 2016-12-30: qty 10

## 2016-12-30 MED ORDER — HEPARIN SOD (PORK) LOCK FLUSH 100 UNIT/ML IV SOLN
500.0000 [IU] | Freq: Once | INTRAVENOUS | Status: AC | PRN
  Administered 2016-12-30: 500 [IU] via INTRAVENOUS
  Filled 2016-12-30: qty 5

## 2016-12-30 NOTE — Patient Instructions (Signed)
Plan to have CA 125 today. Your follow up treatment plan will be determined based on the results of your blood work.

## 2016-12-30 NOTE — Progress Notes (Signed)
Ovarian Cancer Follow-up Note  Consult was initially requested by Dr. Venora Maples for the evaluation of Brenda Cunningham 66 y.o. female with presumed metastatic ovarian cancer  CC:  Chief Complaint  Patient presents with  . Ovarian Cancer    Assessment/Plan:  [name]  is a 66 y.o.  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 S/p interval debulking (optimal) with robotic assisted total hysterectomy, BSO, omentectomy and right external iliac lymphadenectomy on 03/24/16.  Complete response to therapy with no evidence of active disease. Draw CA 125 today - if elevated, will obtain CT. If normal, will follow-up as scheduled.  I will see her back in 3 months.   HPI: Brenda Cunningham is a 66 year old G6P6 who is seen in consultation at the request of ED physician, Dr Jola Schmidt for carcinomatosis. The patient reports having a stroke in August 2016 that resulted in expressive aphasia and right upper extremity weakness.  She has been treated for that with aspirin and L a close and is undergoing physical therapy and speech pathology.  The patient began developing vague left lower quadrant and hip discomfort and was seen in the emergency room on 11/29/2015. A CT scan of the abdomen and pelvis was ordered and revealed an indistinct uterus secondary to surrounding peritoneal masses. Internal coarse calcifications consistent with hyalinized fibroids. Bilateral ovaries enlarged and heterogeneous measuring up to 4 cm is on the right. Bilateral pelvic and inguinal adenopathy which appears malignant with a left inguinal lymph node measuring 1.8 mm and a right external iliac lymph node measuring 18 mm. The retroperitoneal adenopathy extensive pelvic brim. Innumerable masses clustered in the pelvis with interleukin peritoneal thickening and small ascites. The omentum has a started  appearance.  The patient denies early satiety and reports mild and vague abdominal bloating. She has a remote history of breast cancer in 2008 for which she was treated with lumpectomy, chemotherapy, and radiation. She is no family history significant for breast or ovarian cancer. She is otherwise treated for atrial fibrillation and has had a remote history of TIAs in 2010. In 2016 she underwent more significant cerebrovascular event which resulted in expressive aphasia and right upper extremity weakness. She also has hypertension and arthritis. Her prior surgical history significant for a left breast lumpectomy, appendectomy, tubal ligation. She's had 6 vaginal deliveries in the past.  She denies vaginal bleeding or history of abnormal Pap smears.  On 12/17/15 she underwent biopsy of the inguinal node which revealed high grade serous carcinoma. She commenced neoadjuvant chemotherapy on 12/27/15 with dose dense carboplatin and paclitaxel. Day 1 of cycle 3 was on 02/14/16.  After cycle 3 she began experiencing profound weakness in both lower extremities and requires maximal assistance at home now with mobility. She was evaluated by her neurologist, Dr Dellis Filbert on 03/03/16 who felt that she may have focal seizure vs syncopal episode. She is scheduled for an EEG on 03/17/16.  CT of the chest abdo and pelvis on 03/02/16 showed excellent response to therapy with resolution of ascites, regression of previously noted peritoneal implants and regression of lymphadenopathy in the abdomen and pelvis. There is continued soft tissue thickening along the perineal surfaces with the largest residual implant being 1.2 x 2.3 cm in the central low anatomic pelvis, and a 1.3 x 0.9 cm lesion in the low left paracolic gutter. The ovaries measure for 0.0 x 2.6  x 4.7 cm on the right and 1.8 x 2.3 x 2.37 m on the left. The uterus is bulky and enlarged with fibroids.  On 02/28/16 CA 125 normalized to 33.6  On 03/24/16 she underwent  robotic total hysterectomy, BSO, right iliac lymphadenectomy, ometectomy. Intraop findings included minimal residual peritoneal and omental disease, enlarged right external iliac lymph node. R0 resection with no macroscopic disease on completion. The pathology showed high grade serous carcinoma in the ovaries, (left ovarian primary), omentum, external iliac lymph node and peritoneum.  She resumed adjuvant chemotherapy on 04/24/16 with carboplatin and paclitaxel for an additional 3 cycles completed on 06/19/16. Her CA 125 normalized during that time to 12 on 07/23/16. Post-treatment CT chest/abdomen/pelvis on 07/23/16 showed residual disease in the right pelvis: 1. Heterogeneously enhancing 4.9 x 3.2 x 4.4 cm mass along the right pelvic sidewall may represent locally recurrent disease or malignant lymphadenopathy. 2. No other signs of definite metastatic disease noted elsewhere in the abdomen or pelvis. 3. Stable low-attenuation hepatic lesion in the caudate lobe of the liver, which appears to demonstrates some progressive centripetal filling on delayed images. This lesion is presumably benign given its stability compared to prior studies, and is favored to represent a small cavernous hemangioma. 4. Cardiomegaly with biatrial dilatation, which is very severe on the right side.  She and her family elected for expectant management as her CA 125 had normalized after therapy and she had substantial toxicity to her prior therapy.   Interval Hx:  She returns today for scheduled surveillance visit. She had a concerning soft tissue mass identified in the right pelvis (near the site of a postive lymph node resection) in July, 2017. She elected for expectant management.  CT abdo/pelvis on 09/30/16 showed almost complete resolution of right pelvic sidewall mass and no other signs of metastatic disease.  CA 125 on 09/30/16 was normal at 9.5. Vaginal biopsy on 10/05/16 was benign.  She reports feeling well, strong,  good energy and appetite.   No bleeding   Current Meds:  Outpatient Encounter Prescriptions as of 12/30/2016  Medication Sig  . apixaban (ELIQUIS) 5 MG TABS tablet Take 1 tablet (5 mg total) by mouth 2 (two) times daily.  Marland Kitchen aspirin EC 81 MG tablet Take 81 mg by mouth daily.  Marland Kitchen atorvastatin (LIPITOR) 40 MG tablet Take 40 mg by mouth at bedtime.  Marland Kitchen loratadine (CLARITIN) 10 MG tablet Take 10 mg by mouth daily as needed (Recommended for chemo pain). Reported on 03/16/2016  . metoprolol tartrate (LOPRESSOR) 25 MG tablet Take 12.5 mg by mouth 2 (two) times daily.   . mirtazapine (REMERON) 15 MG tablet Take 15 mg by mouth at bedtime.  . Multiple Vitamins-Minerals (MULTIVITAMIN WITH MINERALS) tablet Take 1 tablet by mouth daily.  Marland Kitchen omega-3 acid ethyl esters (LOVAZA) 1 g capsule Take 1 g by mouth daily.  Marland Kitchen PARoxetine (PAXIL) 20 MG tablet TAKE 1 TABLET BY MOUTH DAILY (Patient taking differently: TAKE 20 MG BY MOUTH DAILY)  . verapamil (CALAN-SR) 240 MG CR tablet Take 1 tablet (240 mg total) by mouth daily.  . ferrous fumarate (HEMOCYTE - 106 MG FE) 325 (106 Fe) MG TABS tablet Take 1 tablet daly on an empty stomach with OJ or Vitamin C  . lidocaine-prilocaine (EMLA) cream Apply to Porta-Cath 1-2 hours prior to access as directed.  Marland Kitchen LORazepam (ATIVAN) 0.5 MG tablet Place 1 tablet under the tongue or swallow every 6 hours as needed for nausea. Will make you drowsy. (Patient not taking:  Reported on 10/05/2016)  . [DISCONTINUED] acetaminophen (TYLENOL) 500 MG tablet Take 500-1,000 mg by mouth every 6 (six) hours as needed for moderate pain. Reported on 05/14/2016  . [DISCONTINUED] dexamethasone (DECADRON) 4 MG tablet Take 5 tablets =20 mg with food 12 hrs before Taxol Chemotherapy  . [DISCONTINUED] docusate sodium (COLACE) 100 MG capsule Take 1 capsule (100 mg total) by mouth 2 (two) times daily as needed for mild constipation. (Patient not taking: Reported on 10/05/2016)  . [DISCONTINUED] FERROCITE 324 MG  TABS tablet TAKE 1 TABLET BY MOUTH EVERY DAY ON AN EMPTY STOMACH WITH VITAMIN*C OR ORANGE JUICE  . [DISCONTINUED] ondansetron (ZOFRAN-ODT) 8 MG disintegrating tablet Take 1 tablet (8 mg total) by mouth every 8 (eight) hours as needed for nausea or vomiting.  . [DISCONTINUED] prochlorperazine (COMPAZINE) 10 MG tablet Take 1 tablet (10 mg total) by mouth every 6 (six) hours as needed for nausea or vomiting.  . [DISCONTINUED] traMADol (ULTRAM) 50 MG tablet Take 2 tablets (100 mg total) by mouth every 12 (twelve) hours as needed for moderate pain. (Patient not taking: Reported on 10/05/2016)  . [DISCONTINUED] sodium chloride 0.9 % injection 10 mL    No facility-administered encounter medications on file as of 12/30/2016.     Allergy:  Allergies  Allergen Reactions  . Codeine Hives and Itching  . Latex     Skin breaks out per patient.    Social Hx:   Social History   Social History  . Marital status: Married    Spouse name: N/A  . Number of children: N/A  . Years of education: N/A   Occupational History  . Not on file.   Social History Main Topics  . Smoking status: Never Smoker  . Smokeless tobacco: Never Used  . Alcohol use Yes     Comment: occ beer or glass of wine  . Drug use: No  . Sexual activity: Not on file   Other Topics Concern  . Not on file   Social History Narrative   Lives with her female friend, Mina Marble, walks for exercise.  Originally from [place].    Past Surgical Hx:  Past Surgical History:  Procedure Laterality Date  . APPENDECTOMY    . BREAST LUMPECTOMY  2008   left  . COLONOSCOPY  2004  . DEBULKING N/A 03/24/2016   Procedure: RADICAL TUMOR DEBULKING;  Surgeon: Everitt Amber, MD;  Location: [facility];  Service: Gynecology;  Laterality: N/A;  . OMENTECTOMY N/A 03/24/2016   Procedure: OMENTECTOMY;  Surgeon: Everitt Amber, MD;  Location: [facility];  Service: Gynecology;  Laterality: N/A;  . ROBOTIC ASSISTED TOTAL HYSTERECTOMY WITH BILATERAL SALPINGO OOPHERECTOMY  Bilateral 03/24/2016   Procedure: XI ROBOTIC ASSISTED TOTA LAPAROSCOPIC  HYSTERECTOMY WITH BILATERAL SALPINGO OOPHORECTOMY;  Surgeon: Everitt Amber, MD;  Location: [facility];  Service: Gynecology;  Laterality: Bilateral;  . TUBAL LIGATION      Past Medical Hx:  Past Medical History:  Diagnosis Date  . A-fib ([place])   . Anxiety   . Arthritis   . Breast cancer ([place]) 2008   s/p lumpectomy, chemo and radiation therapy  . Complication of anesthesia    DIFFICULTY WAKING UP FROM ANESTHESIA  . Hyperlipidemia   . Hypertension   . Stroke ([place]) 07/29/2015  . Wears glasses     Past Gynecological History:  SVD x 6  No LMP recorded. Patient is postmenopausal.  Family Hx:  Family History  Problem Relation Age of Onset  . Cirrhosis Father     +  heavy EtOH  . Diabetes Maternal Uncle   . Other Daughter     hx benign breast bx    Review of Systems:  Constitutional  Feels well,    ENT Normal appearing ears and nares bilaterally Skin/Breast  No rash, sores, jaundice, itching, dryness Cardiovascular  No chest pain, shortness of breath, or edema  Pulmonary  No cough or wheeze.  Gastro Intestinal  No nausea, vomitting, or diarrhoea. No bright red blood per rectum, no abdominal pain, change in bowel movement, or constipation.  Genito Urinary  No frequency, urgency, dysuria, + vaginal spotting Musculo Skeletal  Resolved chest wall discomfort,  Neurologic  Improving LE strength with rehab Psychology  No depression, anxiety, insomnia.   Vitals:  Blood pressure 131/78, pulse 61, temperature 97.5 F (36.4 C), temperature source Oral, resp. rate 18, height 5\' 5"  (1.651 m), weight 197 lb 3.2 oz (89.4 kg), SpO2 99 %.  Physical Exam: WD in NAD Neck  Supple NROM, without any enlargements.  Lymph Node Survey No cervical supraclavicular adenopathy. Left inguinal lymph node is no longer palpable Cardiovascular  Pulse normal rate, regularity and rhythm. S1 and S2 normal.  Lungs  Clear to  auscultation bilateraly, without wheezes/crackles/rhonchi. Good air movement.  Skin  No rash/lesions/breakdown  Psychiatry  Alert and oriented to person, place, and time  Abdomen  Normoactive bowel sounds, abdomen soft, non-tender and overweight without evidence of hernia. Slightly distended. No masses. Well healed abdominal incisions Back No CVA tenderness Genito Urinary  Vulva/vagina: Normal external female genitalia.  No lesions. No discharge or bleeding.  Bladder/urethra:  No lesions or masses, well supported bladder  Vagina: vaginal cuff smooth but a stable nodular area appreciated at left vaginal cuff with mild overlying ectasia. Lower aspect of right pelvic sidewall mass NO LONGER appreciated on deep exam.   Cervix: surgically absent .  Uterus: surgically absent.  Adnexa: as above . Rectal  Good tone, no masses no cul de sac nodularity.  Extremities  No bilateral cyanosis, clubbing or edema.   Donaciano Eva, MD  12/30/2016, 11:50 AM

## 2017-01-01 ENCOUNTER — Telehealth: Payer: Self-pay

## 2017-01-01 ENCOUNTER — Other Ambulatory Visit: Payer: Self-pay | Admitting: Oncology

## 2017-01-01 NOTE — Telephone Encounter (Signed)
-----   Message from Gordy Levan, MD sent at 01/01/2017  8:36 AM EST ----- Labs seen and need follow up: please let family know that blood counts and chemistries look good. She has PAC flushes set up thru April; Dr Denman George to see her ~ April, but that apt not yet in EMR. Family should call if they don't get notification of next gyn onc apt.   thanks

## 2017-01-01 NOTE — Telephone Encounter (Signed)
S/w [name] per Dr Mariana Kaufman attached message.

## 2017-01-05 ENCOUNTER — Telehealth: Payer: Self-pay | Admitting: Gynecologic Oncology

## 2017-01-05 DIAGNOSIS — C569 Malignant neoplasm of unspecified ovary: Secondary | ICD-10-CM

## 2017-01-05 NOTE — Telephone Encounter (Signed)
Spoke with the daughter about the recent lab draw for the CA 125.  Patient had went back to flush at her last appt for a CA 125 but our office received a phone call that the blood could not be used due to drawing error.  Per Dr. Denman George, the patient could wait until her next flush in March if she wanted to but daughter wanted to proceed with another lab draw for CA 125 next week.  Appt made.  Advised to call for any needs or concerns.

## 2017-01-06 ENCOUNTER — Telehealth: Payer: Self-pay | Admitting: Gynecologic Oncology

## 2017-01-06 NOTE — Telephone Encounter (Signed)
Appointment schedule and letter mailed to patient. 01/06/17  °

## 2017-01-13 ENCOUNTER — Other Ambulatory Visit: Payer: Self-pay | Admitting: Hematology and Oncology

## 2017-01-14 ENCOUNTER — Ambulatory Visit (HOSPITAL_BASED_OUTPATIENT_CLINIC_OR_DEPARTMENT_OTHER): Payer: Medicare Other

## 2017-01-14 ENCOUNTER — Other Ambulatory Visit: Payer: Medicare Other

## 2017-01-14 DIAGNOSIS — C569 Malignant neoplasm of unspecified ovary: Secondary | ICD-10-CM

## 2017-01-14 DIAGNOSIS — C561 Malignant neoplasm of right ovary: Secondary | ICD-10-CM | POA: Diagnosis not present

## 2017-01-14 DIAGNOSIS — C563 Malignant neoplasm of bilateral ovaries: Secondary | ICD-10-CM

## 2017-01-14 DIAGNOSIS — C562 Malignant neoplasm of left ovary: Secondary | ICD-10-CM

## 2017-01-14 DIAGNOSIS — Z452 Encounter for adjustment and management of vascular access device: Secondary | ICD-10-CM

## 2017-01-14 DIAGNOSIS — Z95828 Presence of other vascular implants and grafts: Secondary | ICD-10-CM

## 2017-01-14 MED ORDER — SODIUM CHLORIDE 0.9% FLUSH
10.0000 mL | INTRAVENOUS | Status: DC | PRN
  Administered 2017-01-14: 10 mL via INTRAVENOUS
  Filled 2017-01-14: qty 10

## 2017-01-14 MED ORDER — HEPARIN SOD (PORK) LOCK FLUSH 100 UNIT/ML IV SOLN
500.0000 [IU] | Freq: Once | INTRAVENOUS | Status: AC | PRN
  Administered 2017-01-14: 500 [IU] via INTRAVENOUS
  Filled 2017-01-14: qty 5

## 2017-01-15 LAB — CA 125: Cancer Antigen (CA) 125: 11.9 U/mL (ref 0.0–38.1)

## 2017-01-18 ENCOUNTER — Telehealth: Payer: Self-pay

## 2017-01-18 NOTE — Telephone Encounter (Signed)
Pt informed of normal test result. Verbalized understanding. 

## 2017-02-09 ENCOUNTER — Telehealth: Payer: Self-pay

## 2017-02-09 NOTE — Telephone Encounter (Signed)
RX refill from [facility] for Ativan came in under Dr Mariana Kaufman name, pt is not following a MED/ONC and Dr Everitt Amber see's this pt 1x a year. Pt will need to obtain script from her PCP that can monitor her closely. RN left message for pt's daughter.

## 2017-02-10 ENCOUNTER — Ambulatory Visit (HOSPITAL_BASED_OUTPATIENT_CLINIC_OR_DEPARTMENT_OTHER): Payer: Medicare Other

## 2017-02-10 ENCOUNTER — Ambulatory Visit
Admission: RE | Admit: 2017-02-10 | Discharge: 2017-02-10 | Disposition: A | Discharge status: Home / Self Care | Payer: Medicare Other | Source: Ambulatory Visit | Attending: Family Medicine | Admitting: Family Medicine

## 2017-02-10 ENCOUNTER — Other Ambulatory Visit: Payer: Self-pay | Admitting: Hematology and Oncology

## 2017-02-10 ENCOUNTER — Ambulatory Visit: Payer: Medicare Other

## 2017-02-10 DIAGNOSIS — Z452 Encounter for adjustment and management of vascular access device: Secondary | ICD-10-CM | POA: Diagnosis not present

## 2017-02-10 DIAGNOSIS — C562 Malignant neoplasm of left ovary: Secondary | ICD-10-CM | POA: Diagnosis not present

## 2017-02-10 DIAGNOSIS — C561 Malignant neoplasm of right ovary: Secondary | ICD-10-CM | POA: Diagnosis not present

## 2017-02-10 DIAGNOSIS — Z95828 Presence of other vascular implants and grafts: Secondary | ICD-10-CM

## 2017-02-10 DIAGNOSIS — Z1231 Encounter for screening mammogram for malignant neoplasm of breast: Secondary | ICD-10-CM

## 2017-02-10 DIAGNOSIS — C563 Malignant neoplasm of bilateral ovaries: Secondary | ICD-10-CM

## 2017-02-10 MED ORDER — SODIUM CHLORIDE 0.9 % IJ SOLN
10.0000 mL | INTRAMUSCULAR | Status: DC | PRN
  Administered 2017-02-10: 10 mL via INTRAVENOUS
  Filled 2017-02-10: qty 10

## 2017-02-10 MED ORDER — HEPARIN SOD (PORK) LOCK FLUSH 100 UNIT/ML IV SOLN
500.0000 [IU] | Freq: Once | INTRAVENOUS | Status: AC | PRN
  Administered 2017-02-10: 500 [IU] via INTRAVENOUS
  Filled 2017-02-10: qty 5

## 2017-02-12 ENCOUNTER — Other Ambulatory Visit: Payer: Self-pay | Admitting: Oncology

## 2017-02-12 DIAGNOSIS — C561 Malignant neoplasm of right ovary: Secondary | ICD-10-CM

## 2017-02-12 DIAGNOSIS — C562 Malignant neoplasm of left ovary: Principal | ICD-10-CM

## 2017-02-12 DIAGNOSIS — C563 Malignant neoplasm of bilateral ovaries: Secondary | ICD-10-CM

## 2017-02-25 ENCOUNTER — Encounter (HOSPITAL_COMMUNITY): Payer: Self-pay

## 2017-03-24 ENCOUNTER — Ambulatory Visit: Payer: Medicare Other | Attending: Gynecologic Oncology | Admitting: Gynecologic Oncology

## 2017-03-24 ENCOUNTER — Encounter: Payer: Self-pay | Admitting: Gynecologic Oncology

## 2017-03-24 ENCOUNTER — Ambulatory Visit: Payer: Medicare Other | Admitting: Nurse Practitioner

## 2017-03-24 VITALS — BP 121/75 | HR 63 | Temp 98.0°F | Resp 18 | Wt 205.6 lb

## 2017-03-24 VITALS — BP 121/75 | HR 63 | Temp 98.0°F | Resp 18 | Wt 205.4 lb

## 2017-03-24 DIAGNOSIS — Z08 Encounter for follow-up examination after completed treatment for malignant neoplasm: Secondary | ICD-10-CM | POA: Insufficient documentation

## 2017-03-24 DIAGNOSIS — Z885 Allergy status to narcotic agent status: Secondary | ICD-10-CM | POA: Diagnosis not present

## 2017-03-24 DIAGNOSIS — Z853 Personal history of malignant neoplasm of breast: Secondary | ICD-10-CM | POA: Insufficient documentation

## 2017-03-24 DIAGNOSIS — Z95828 Presence of other vascular implants and grafts: Secondary | ICD-10-CM

## 2017-03-24 DIAGNOSIS — I10 Essential (primary) hypertension: Secondary | ICD-10-CM | POA: Diagnosis not present

## 2017-03-24 DIAGNOSIS — Z7901 Long term (current) use of anticoagulants: Secondary | ICD-10-CM | POA: Insufficient documentation

## 2017-03-24 DIAGNOSIS — C563 Malignant neoplasm of bilateral ovaries: Secondary | ICD-10-CM

## 2017-03-24 DIAGNOSIS — F419 Anxiety disorder, unspecified: Secondary | ICD-10-CM | POA: Diagnosis not present

## 2017-03-24 DIAGNOSIS — E785 Hyperlipidemia, unspecified: Secondary | ICD-10-CM | POA: Insufficient documentation

## 2017-03-24 DIAGNOSIS — M1990 Unspecified osteoarthritis, unspecified site: Secondary | ICD-10-CM | POA: Insufficient documentation

## 2017-03-24 DIAGNOSIS — Z91040 Latex allergy status: Secondary | ICD-10-CM | POA: Insufficient documentation

## 2017-03-24 DIAGNOSIS — C569 Malignant neoplasm of unspecified ovary: Secondary | ICD-10-CM

## 2017-03-24 DIAGNOSIS — Z90722 Acquired absence of ovaries, bilateral: Secondary | ICD-10-CM | POA: Diagnosis not present

## 2017-03-24 DIAGNOSIS — Z79899 Other long term (current) drug therapy: Secondary | ICD-10-CM | POA: Insufficient documentation

## 2017-03-24 DIAGNOSIS — I4891 Unspecified atrial fibrillation: Secondary | ICD-10-CM | POA: Insufficient documentation

## 2017-03-24 DIAGNOSIS — Z8543 Personal history of malignant neoplasm of ovary: Secondary | ICD-10-CM

## 2017-03-24 DIAGNOSIS — Z9221 Personal history of antineoplastic chemotherapy: Secondary | ICD-10-CM

## 2017-03-24 DIAGNOSIS — Z7982 Long term (current) use of aspirin: Secondary | ICD-10-CM | POA: Diagnosis not present

## 2017-03-24 DIAGNOSIS — Z8673 Personal history of transient ischemic attack (TIA), and cerebral infarction without residual deficits: Secondary | ICD-10-CM | POA: Diagnosis not present

## 2017-03-24 DIAGNOSIS — C561 Malignant neoplasm of right ovary: Secondary | ICD-10-CM

## 2017-03-24 DIAGNOSIS — Z90710 Acquired absence of both cervix and uterus: Secondary | ICD-10-CM | POA: Diagnosis not present

## 2017-03-24 DIAGNOSIS — C562 Malignant neoplasm of left ovary: Secondary | ICD-10-CM

## 2017-03-24 MED ORDER — SODIUM CHLORIDE 0.9% FLUSH
10.0000 mL | Freq: Once | INTRAVENOUS | Status: AC
  Administered 2017-03-24: 10 mL
  Filled 2017-03-24: qty 10

## 2017-03-24 MED ORDER — HEPARIN SOD (PORK) LOCK FLUSH 100 UNIT/ML IV SOLN
500.0000 [IU] | Freq: Once | INTRAVENOUS | Status: AC
  Administered 2017-03-24: 500 [IU]
  Filled 2017-03-24: qty 5

## 2017-03-24 NOTE — Patient Instructions (Signed)
Implanted Port Home Guide An implanted port is a type of central line that is placed under the skin. Central lines are used to provide IV access when treatment or nutrition needs to be given through a person's veins. Implanted ports are used for long-term IV access. An implanted port may be placed because:  You need IV medicine that would be irritating to the small veins in your hands or arms.  You need long-term IV medicines, such as antibiotics.  You need IV nutrition for a long period.  You need frequent blood draws for lab tests.  You need dialysis.  Implanted ports are usually placed in the chest area, but they can also be placed in the upper arm, the abdomen, or the leg. An implanted port has two main parts:  Reservoir. The reservoir is round and will appear as a small, raised area under your skin. The reservoir is the part where a needle is inserted to give medicines or draw blood.  Catheter. The catheter is a thin, flexible tube that extends from the reservoir. The catheter is placed into a large vein. Medicine that is inserted into the reservoir goes into the catheter and then into the vein.  How will I care for my incision site? Do not get the incision site wet. Bathe or shower as directed by your health care provider. How is my port accessed? Special steps must be taken to access the port:  Before the port is accessed, a numbing cream can be placed on the skin. This helps numb the skin over the port site.  Your health care provider uses a sterile technique to access the port. ? Your health care provider must put on a mask and sterile gloves. ? The skin over your port is cleaned carefully with an antiseptic and allowed to dry. ? The port is gently pinched between sterile gloves, and a needle is inserted into the port.  Only "non-coring" port needles should be used to access the port. Once the port is accessed, a blood return should be checked. This helps ensure that the port  is in the vein and is not clogged.  If your port needs to remain accessed for a constant infusion, a clear (transparent) bandage will be placed over the needle site. The bandage and needle will need to be changed every week, or as directed by your health care provider.  Keep the bandage covering the needle clean and dry. Do not get it wet. Follow your health care provider's instructions on how to take a shower or bath while the port is accessed.  If your port does not need to stay accessed, no bandage is needed over the port.  What is flushing? Flushing helps keep the port from getting clogged. Follow your health care provider's instructions on how and when to flush the port. Ports are usually flushed with saline solution or a medicine called heparin. The need for flushing will depend on how the port is used.  If the port is used for intermittent medicines or blood draws, the port will need to be flushed: ? After medicines have been given. ? After blood has been drawn. ? As part of routine maintenance.  If a constant infusion is running, the port may not need to be flushed.  How long will my port stay implanted? The port can stay in for as long as your health care provider thinks it is needed. When it is time for the port to come out, surgery will be   done to remove it. The procedure is similar to the one performed when the port was put in. When should I seek immediate medical care? When you have an implanted port, you should seek immediate medical care if:  You notice a bad smell coming from the incision site.  You have swelling, redness, or drainage at the incision site.  You have more swelling or pain at the port site or the surrounding area.  You have a fever that is not controlled with medicine.  This information is not intended to replace advice given to you by your health care provider. Make sure you discuss any questions you have with your health care provider. Document  Released: 11/23/2005 Document Revised: 04/30/2016 Document Reviewed: 07/31/2013 Elsevier Interactive Patient Education  2017 [employer]  

## 2017-03-24 NOTE — Progress Notes (Signed)
Ovarian Cancer Follow-up Note  Consult was initially requested by Dr. Venora Maples for the evaluation of Brenda Cunningham 66 y.o. female with presumed metastatic ovarian cancer  CC:  Chief Complaint  Patient presents with  . Ovarian Cancer    Assessment/Plan:  [name]  is a 66 y.o.  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 S/p interval debulking (optimal) with robotic assisted total hysterectomy, BSO, omentectomy and right external iliac lymphadenectomy on 03/24/16.  Complete response to therapy with no evidence of active disease. Draw CA 125 in 6 weeks with lab flush- if elevated, will obtain CT. If normal, will follow-up as scheduled.  I will see her back in 3 months.   HPI: Brenda Cunningham is a 66 year old G6P6 who is seen in consultation at the request of ED physician, Dr Jola Schmidt for carcinomatosis. The patient reports having a stroke in August 2016 that resulted in expressive aphasia and right upper extremity weakness.  She has been treated for that with aspirin and L a close and is undergoing physical therapy and speech pathology.  The patient began developing vague left lower quadrant and hip discomfort and was seen in the emergency room on 11/29/2015. A CT scan of the abdomen and pelvis was ordered and revealed an indistinct uterus secondary to surrounding peritoneal masses. Internal coarse calcifications consistent with hyalinized fibroids. Bilateral ovaries enlarged and heterogeneous measuring up to 4 cm is on the right. Bilateral pelvic and inguinal adenopathy which appears malignant with a left inguinal lymph node measuring 1.8 mm and a right external iliac lymph node measuring 18 mm. The retroperitoneal adenopathy extensive pelvic brim. Innumerable masses clustered in the pelvis with interleukin peritoneal thickening and small ascites. The omentum has a  started appearance.  The patient denies early satiety and reports mild and vague abdominal bloating. She has a remote history of breast cancer in 2008 for which she was treated with lumpectomy, chemotherapy, and radiation. She is no family history significant for breast or ovarian cancer. She is otherwise treated for atrial fibrillation and has had a remote history of TIAs in 2010. In 2016 she underwent more significant cerebrovascular event which resulted in expressive aphasia and right upper extremity weakness. She also has hypertension and arthritis. Her prior surgical history significant for a left breast lumpectomy, appendectomy, tubal ligation. She's had 6 vaginal deliveries in the past.  She denies vaginal bleeding or history of abnormal Pap smears.  On 12/17/15 she underwent biopsy of the inguinal node which revealed high grade serous carcinoma. She commenced neoadjuvant chemotherapy on 12/27/15 with dose dense carboplatin and paclitaxel. Day 1 of cycle 3 was on 02/14/16.  After cycle 3 she began experiencing profound weakness in both lower extremities and requires maximal assistance at home now with mobility. She was evaluated by her neurologist, Dr Dellis Filbert on 03/03/16 who felt that she may have focal seizure vs syncopal episode. She is scheduled for an EEG on 03/17/16.  CT of the chest abdo and pelvis on 03/02/16 showed excellent response to therapy with resolution of ascites, regression of previously noted peritoneal implants and regression of lymphadenopathy in the abdomen and pelvis. There is continued soft tissue thickening along the perineal surfaces with the largest residual implant being 1.2 x 2.3 cm in the central low anatomic pelvis, and a 1.3 x 0.9 cm lesion in the low left paracolic gutter. The ovaries measure  for 0.0 x 2.6 x 4.7 cm on the right and 1.8 x 2.3 x 2.37 m on the left. The uterus is bulky and enlarged with fibroids.  On 02/28/16 CA 125 normalized to 33.6  On 03/24/16 she  underwent robotic total hysterectomy, BSO, right iliac lymphadenectomy, ometectomy. Intraop findings included minimal residual peritoneal and omental disease, enlarged right external iliac lymph node. R0 resection with no macroscopic disease on completion. The pathology showed high grade serous carcinoma in the ovaries, (left ovarian primary), omentum, external iliac lymph node and peritoneum.  She resumed adjuvant chemotherapy on 04/24/16 with carboplatin and paclitaxel for an additional 3 cycles completed on 06/19/16. Her CA 125 normalized during that time to 12 on 07/23/16. Post-treatment CT chest/abdomen/pelvis on 07/23/16 showed residual disease in the right pelvis: 1. Heterogeneously enhancing 4.9 x 3.2 x 4.4 cm mass along the right pelvic sidewall may represent locally recurrent disease or malignant lymphadenopathy. 2. No other signs of definite metastatic disease noted elsewhere in the abdomen or pelvis. 3. Stable low-attenuation hepatic lesion in the caudate lobe of the liver, which appears to demonstrates some progressive centripetal filling on delayed images. This lesion is presumably benign given its stability compared to prior studies, and is favored to represent a small cavernous hemangioma. 4. Cardiomegaly with biatrial dilatation, which is very severe on the right side.  She and her family elected for expectant management as her CA 125 had normalized after therapy and she had substantial toxicity to her prior therapy.  CT abdo/pelvis on 09/30/16 showed almost complete resolution of right pelvic sidewall mass and no other signs of metastatic disease.  CA 125 on 09/30/16 was normal at 9.5. Vaginal biopsy on 10/05/16 of nodular area at cuff was benign. CA 125 in January, 2017 was normal at 11.9   Interval Hx: She returns today for scheduled No bleedingsurveillance visit.  She reports feeling well, strong, good energy and appetite.  No bleeding  Current Meds:  Outpatient Encounter  Prescriptions as of 03/24/2017  Medication Sig  . apixaban (ELIQUIS) 5 MG TABS tablet Take 1 tablet (5 mg total) by mouth 2 (two) times daily.  Marland Kitchen aspirin EC 81 MG tablet Take 81 mg by mouth daily.  Marland Kitchen atorvastatin (LIPITOR) 40 MG tablet Take 40 mg by mouth at bedtime.  . ferrous fumarate (HEMOCYTE - 106 MG FE) 325 (106 Fe) MG TABS tablet Take 1 tablet daly on an empty stomach with OJ or Vitamin C  . lidocaine-prilocaine (EMLA) cream Apply to Porta-Cath 1-2 hours prior to access as directed.  . loratadine (CLARITIN) 10 MG tablet Take 10 mg by mouth daily as needed (Recommended for chemo pain). Reported on 03/16/2016  . LORazepam (ATIVAN) 0.5 MG tablet Place 1 tablet under the tongue or swallow every 6 hours as needed for nausea. Will make you drowsy. (Patient not taking: Reported on 10/05/2016)  . metoprolol tartrate (LOPRESSOR) 25 MG tablet Take 12.5 mg by mouth 2 (two) times daily.   . mirtazapine (REMERON) 15 MG tablet Take 15 mg by mouth at bedtime.  Marland Kitchen omega-3 acid ethyl esters (LOVAZA) 1 g capsule Take 1 g by mouth daily.  Marland Kitchen PARoxetine (PAXIL) 20 MG tablet TAKE 1 TABLET BY MOUTH DAILY (Patient taking differently: TAKE 20 MG BY MOUTH DAILY)  . verapamil (CALAN-SR) 240 MG CR tablet Take 1 tablet (240 mg total) by mouth daily.  . [DISCONTINUED] Multiple Vitamins-Minerals (MULTIVITAMIN WITH MINERALS) tablet Take 1 tablet by mouth daily.   No facility-administered encounter medications on file as  of 03/24/2017.     Allergy:  Allergies  Allergen Reactions  . Codeine Hives and Itching  . Latex     Skin breaks out per patient.    Social Hx:   Social History   Social History  . Marital status: Married    Spouse name: N/A  . Number of children: N/A  . Years of education: N/A   Occupational History  . Not on file.   Social History Main Topics  . Smoking status: Never Smoker  . Smokeless tobacco: Never Used  . Alcohol use Yes     Comment: occ beer or glass of wine  . Drug use: No  .  Sexual activity: Not on file   Other Topics Concern  . Not on file   Social History Narrative   Lives with her female friend, Mina Marble, walks for exercise.  Originally from [place].    Past Surgical Hx:  Past Surgical History:  Procedure Laterality Date  . APPENDECTOMY    . BREAST LUMPECTOMY  2008   left  . COLONOSCOPY  2004  . DEBULKING N/A 03/24/2016   Procedure: RADICAL TUMOR DEBULKING;  Surgeon: Everitt Amber, MD;  Location: [facility];  Service: Gynecology;  Laterality: N/A;  . OMENTECTOMY N/A 03/24/2016   Procedure: OMENTECTOMY;  Surgeon: Everitt Amber, MD;  Location: [facility];  Service: Gynecology;  Laterality: N/A;  . ROBOTIC ASSISTED TOTAL HYSTERECTOMY WITH BILATERAL SALPINGO OOPHERECTOMY Bilateral 03/24/2016   Procedure: XI ROBOTIC ASSISTED TOTA LAPAROSCOPIC  HYSTERECTOMY WITH BILATERAL SALPINGO OOPHORECTOMY;  Surgeon: Everitt Amber, MD;  Location: [facility];  Service: Gynecology;  Laterality: Bilateral;  . TUBAL LIGATION      Past Medical Hx:  Past Medical History:  Diagnosis Date  . A-fib ([place])   . Anxiety   . Arthritis   . Breast cancer ([place]) 2008   s/p lumpectomy, chemo and radiation therapy  . Complication of anesthesia    DIFFICULTY WAKING UP FROM ANESTHESIA  . Hyperlipidemia   . Hypertension   . Stroke ([place]) 07/29/2015  . Wears glasses     Past Gynecological History:  SVD x 6  No LMP recorded. Patient is postmenopausal.  Family Hx:  Family History  Problem Relation Age of Onset  . Cirrhosis Father     +heavy EtOH  . Diabetes Maternal Uncle   . Other Daughter     hx benign breast bx    Review of Systems:  Constitutional  Feels well,    ENT Normal appearing ears and nares bilaterally Skin/Breast  No rash, sores, jaundice, itching, dryness Cardiovascular  No chest pain, shortness of breath, or edema  Pulmonary  No cough or wheeze.  Gastro Intestinal  No nausea, vomitting, or diarrhoea. No bright red blood per rectum, no abdominal pain, change in bowel  movement, or constipation.  Genito Urinary  No frequency, urgency, dysuria, + vaginal spotting Musculo Skeletal  Resolved chest wall discomfort,  Neurologic  Improving LE strength with rehab Psychology  No depression, anxiety, insomnia.   Vitals:  Blood pressure 121/75, pulse 63, temperature 98 F (36.7 C), temperature source Oral, resp. rate 18, weight 205 lb 9.6 oz (93.3 kg), SpO2 100 %.  Physical Exam: WD in NAD Neck  Supple NROM, without any enlargements.  Lymph Node Survey No cervical supraclavicular adenopathy. Left inguinal lymph node is no longer palpable Cardiovascular  Pulse normal rate, regularity and rhythm. S1 and S2 normal.  Lungs  Clear to auscultation bilateraly, without wheezes/crackles/rhonchi. Good air movement.  Skin  No rash/lesions/breakdown  Psychiatry  Alert and oriented to person, place, and time  Abdomen  Normoactive bowel sounds, abdomen soft, non-tender and overweight without evidence of hernia. Slightly distended. No masses. Well healed abdominal incisions Back No CVA tenderness Genito Urinary  Vulva/vagina: Normal external female genitalia.  No lesions. No discharge or bleeding.  Bladder/urethra:  No lesions or masses, well supported bladder  Vagina: vaginal cuff smooth but a stable nodular area appreciated at left vaginal cuff with mild overlying ectasia. Lower aspect of right pelvic sidewall mass NO LONGER appreciated on deep exam.   Cervix: surgically absent .  Uterus: surgically absent.  Adnexa: as above . Rectal  Good tone, no masses no cul de sac nodularity.  Extremities  No bilateral cyanosis, clubbing or edema.   Donaciano Eva, MD  03/24/2017, 10:23 AM

## 2017-03-24 NOTE — Patient Instructions (Signed)
If you are having trouble passing bowel movements, increase miralax to twice a day and add metamucil once a day. Both are over the counter medications.

## 2017-04-05 ENCOUNTER — Ambulatory Visit: Payer: Medicare Other | Admitting: Gynecologic Oncology

## 2017-05-05 ENCOUNTER — Other Ambulatory Visit: Payer: Medicare Other

## 2017-05-05 ENCOUNTER — Ambulatory Visit (HOSPITAL_BASED_OUTPATIENT_CLINIC_OR_DEPARTMENT_OTHER): Payer: Medicare Other

## 2017-05-05 DIAGNOSIS — Z452 Encounter for adjustment and management of vascular access device: Secondary | ICD-10-CM | POA: Diagnosis not present

## 2017-05-05 DIAGNOSIS — C562 Malignant neoplasm of left ovary: Secondary | ICD-10-CM

## 2017-05-05 DIAGNOSIS — C561 Malignant neoplasm of right ovary: Secondary | ICD-10-CM

## 2017-05-05 DIAGNOSIS — C563 Malignant neoplasm of bilateral ovaries: Secondary | ICD-10-CM

## 2017-05-05 DIAGNOSIS — Z95828 Presence of other vascular implants and grafts: Secondary | ICD-10-CM

## 2017-05-05 DIAGNOSIS — C569 Malignant neoplasm of unspecified ovary: Secondary | ICD-10-CM

## 2017-05-05 MED ORDER — SODIUM CHLORIDE 0.9% FLUSH
10.0000 mL | Freq: Once | INTRAVENOUS | Status: AC
  Administered 2017-05-05: 10 mL
  Filled 2017-05-05: qty 10

## 2017-05-05 MED ORDER — HEPARIN SOD (PORK) LOCK FLUSH 100 UNIT/ML IV SOLN
500.0000 [IU] | Freq: Once | INTRAVENOUS | Status: AC
  Administered 2017-05-05: 500 [IU]
  Filled 2017-05-05: qty 5

## 2017-05-05 NOTE — Patient Instructions (Signed)

## 2017-05-06 LAB — CA 125: Cancer Antigen (CA) 125: 28 U/mL (ref 0.0–38.1)

## 2017-06-14 ENCOUNTER — Ambulatory Visit: Payer: Medicare Other

## 2017-06-14 ENCOUNTER — Ambulatory Visit (HOSPITAL_BASED_OUTPATIENT_CLINIC_OR_DEPARTMENT_OTHER): Payer: Medicare Other

## 2017-06-14 DIAGNOSIS — Z4589 Encounter for adjustment and management of other implanted devices: Secondary | ICD-10-CM

## 2017-06-14 DIAGNOSIS — Z95828 Presence of other vascular implants and grafts: Secondary | ICD-10-CM

## 2017-06-14 DIAGNOSIS — C562 Malignant neoplasm of left ovary: Secondary | ICD-10-CM

## 2017-06-14 DIAGNOSIS — C563 Malignant neoplasm of bilateral ovaries: Secondary | ICD-10-CM

## 2017-06-14 DIAGNOSIS — C561 Malignant neoplasm of right ovary: Secondary | ICD-10-CM | POA: Diagnosis not present

## 2017-06-14 DIAGNOSIS — Z452 Encounter for adjustment and management of vascular access device: Secondary | ICD-10-CM

## 2017-06-14 DIAGNOSIS — C569 Malignant neoplasm of unspecified ovary: Secondary | ICD-10-CM

## 2017-06-14 MED ORDER — HEPARIN SOD (PORK) LOCK FLUSH 100 UNIT/ML IV SOLN
500.0000 [IU] | Freq: Once | INTRAVENOUS | Status: AC
  Administered 2017-06-14: 500 [IU]
  Filled 2017-06-14: qty 5

## 2017-06-14 MED ORDER — SODIUM CHLORIDE 0.9% FLUSH
10.0000 mL | Freq: Once | INTRAVENOUS | Status: AC
  Administered 2017-06-14: 10 mL
  Filled 2017-06-14: qty 10

## 2017-06-15 ENCOUNTER — Other Ambulatory Visit: Payer: Self-pay | Admitting: Gynecologic Oncology

## 2017-06-15 ENCOUNTER — Telehealth: Payer: Self-pay | Admitting: Gynecologic Oncology

## 2017-06-15 ENCOUNTER — Encounter: Payer: Self-pay | Admitting: Gynecologic Oncology

## 2017-06-15 DIAGNOSIS — R971 Elevated cancer antigen 125 [CA 125]: Secondary | ICD-10-CM

## 2017-06-15 DIAGNOSIS — C563 Malignant neoplasm of bilateral ovaries: Secondary | ICD-10-CM

## 2017-06-15 DIAGNOSIS — C569 Malignant neoplasm of unspecified ovary: Secondary | ICD-10-CM

## 2017-06-15 DIAGNOSIS — C561 Malignant neoplasm of right ovary: Secondary | ICD-10-CM

## 2017-06-15 DIAGNOSIS — C562 Malignant neoplasm of left ovary: Secondary | ICD-10-CM

## 2017-06-15 LAB — CA 125: Cancer Antigen (CA) 125: 45.7 U/mL — ABNORMAL HIGH (ref 0.0–38.1)

## 2017-06-15 NOTE — Telephone Encounter (Signed)
Left message for patient's daughter asking her to please call the office to discuss CA 125 results and Dr. Serita Grit recommendations.

## 2017-06-15 NOTE — Progress Notes (Signed)
Elevated tumor marker. Recommend CT abd/pelv with contrast to evaluate for recurrence.

## 2017-06-15 NOTE — Telephone Encounter (Signed)
See progress note attached to CT order.

## 2017-06-16 ENCOUNTER — Other Ambulatory Visit: Payer: Medicare Other

## 2017-06-17 NOTE — Telephone Encounter (Signed)
LM to call back to office to discuss the CA-125 results.

## 2017-06-18 NOTE — Telephone Encounter (Signed)
Spoke with Dtr. [name] and told her that her mother's Ca-125 drawn on 06-14-17 was mildly elevated. Dr. Denman George recommends a CT scan of the A/p to evaluate for recurrence. CT scheduled for 06-24-17 at [facility] at 1030. Npo after MN.  Dtr. [name] to p/u oral contrast at radiology prior to the scan. C-met added to labs drawn on 06-14-17 for Creatine for CT.

## 2017-06-23 LAB — COMPREHENSIVE METABOLIC PANEL (CC13)
ALT: 23 IU/L (ref 0–32)
AST (SGOT): 19 IU/L (ref 0–40)
Albumin, Serum: 3.9 g/dL (ref 3.6–4.8)
Albumin/Globulin Ratio: 1.2 (ref 1.2–2.2)
Alkaline Phosphatase, S: 129 IU/L — ABNORMAL HIGH (ref 39–117)
BUN/Creatinine Ratio: 19 (ref 12–28)
BUN: 17 mg/dL (ref 8–27)
Bilirubin Total: <0.2 mg/dL (ref 0.0–1.2)
Calcium, Ser: 9.7 mg/dL (ref 8.7–10.3)
Carbon Dioxide, Total: 20 mmol/L (ref 20–29)
Chloride, Ser: 105 mmol/L (ref 96–106)
Creatinine, Ser: 0.88 mg/dL (ref 0.57–1.00)
GFR calc Af Amer: 79 mL/min/{1.73_m2} (ref >59)
GFR calc non Af Amer: 68 mL/min/{1.73_m2} (ref >59)
Globulin, Total: 3.3 g/dL (ref 1.5–4.5)
Glucose: 114 mg/dL — ABNORMAL HIGH (ref 65–99)
Potassium, Ser: 3.9 mmol/L (ref 3.5–5.2)
Sodium: 145 mmol/L — ABNORMAL HIGH (ref 134–144)
Total Protein: 7.2 g/dL (ref 6.0–8.5)

## 2017-06-24 ENCOUNTER — Ambulatory Visit (HOSPITAL_COMMUNITY)
Admission: RE | Admit: 2017-06-24 | Discharge: 2017-06-24 | Disposition: A | Discharge status: Home / Self Care | Payer: Medicare Other | Source: Ambulatory Visit | Attending: Gynecologic Oncology | Admitting: Gynecologic Oncology

## 2017-06-24 DIAGNOSIS — D1803 Hemangioma of intra-abdominal structures: Secondary | ICD-10-CM | POA: Insufficient documentation

## 2017-06-24 DIAGNOSIS — C786 Secondary malignant neoplasm of retroperitoneum and peritoneum: Secondary | ICD-10-CM | POA: Insufficient documentation

## 2017-06-24 DIAGNOSIS — R971 Elevated cancer antigen 125 [CA 125]: Secondary | ICD-10-CM | POA: Insufficient documentation

## 2017-06-24 DIAGNOSIS — C562 Malignant neoplasm of left ovary: Secondary | ICD-10-CM | POA: Diagnosis present

## 2017-06-24 DIAGNOSIS — C561 Malignant neoplasm of right ovary: Secondary | ICD-10-CM | POA: Insufficient documentation

## 2017-06-24 DIAGNOSIS — C563 Malignant neoplasm of bilateral ovaries: Secondary | ICD-10-CM

## 2017-06-24 DIAGNOSIS — C569 Malignant neoplasm of unspecified ovary: Secondary | ICD-10-CM

## 2017-06-24 MED ORDER — IOPAMIDOL (ISOVUE-300) INJECTION 61%
100.0000 mL | Freq: Once | INTRAVENOUS | Status: AC | PRN
  Administered 2017-06-24: 100 mL via INTRAVENOUS

## 2017-06-24 MED ORDER — IOPAMIDOL (ISOVUE-300) INJECTION 61%
INTRAVENOUS | Status: AC
  Filled 2017-06-24: qty 100

## 2017-07-01 ENCOUNTER — Telehealth: Payer: Self-pay | Admitting: Gynecologic Oncology

## 2017-07-01 ENCOUNTER — Telehealth: Payer: Self-pay | Admitting: Hematology and Oncology

## 2017-07-01 NOTE — Telephone Encounter (Signed)
Called to discuss CT results showing recurrence. No answer. Left message to call back.  The CT scan shows recurrence in the peritoneum at multiple sites. Not a good candidate for surgery.  Platinum sensitive (completed therapy 12 months ago). Recommend platinum/taxane versus platinum/doxil chemotherapy with the additional of PARP inhibitor after completing therapy if partial or complete response on imaging.  Will facilitate referral to Dr Alvy Bimler.  Brenda Eva, MD

## 2017-07-01 NOTE — Telephone Encounter (Signed)
See Dr. Serita Grit telephone call.  Informed patient's daughter of CT results and Dr. Serita Grit recommendations.  Advised her we would be calling her with a new patient appt to meet with Dr. Alvy Bimler to discuss chemotherapy.  All questions answered.

## 2017-07-01 NOTE — Telephone Encounter (Signed)
sw pt daughter to confirm 8/7 appt at 3 pm per sch msg

## 2017-07-13 ENCOUNTER — Ambulatory Visit (HOSPITAL_BASED_OUTPATIENT_CLINIC_OR_DEPARTMENT_OTHER): Payer: Medicare Other | Admitting: Hematology and Oncology

## 2017-07-13 ENCOUNTER — Telehealth: Payer: Self-pay | Admitting: Hematology and Oncology

## 2017-07-13 VITALS — BP 154/98 | HR 100 | Temp 98.7°F | Resp 17 | Ht 65.0 in | Wt 216.1 lb

## 2017-07-13 DIAGNOSIS — I482 Chronic atrial fibrillation, unspecified: Secondary | ICD-10-CM

## 2017-07-13 DIAGNOSIS — C774 Secondary and unspecified malignant neoplasm of inguinal and lower limb lymph nodes: Secondary | ICD-10-CM | POA: Diagnosis not present

## 2017-07-13 DIAGNOSIS — G8191 Hemiplegia, unspecified affecting right dominant side: Secondary | ICD-10-CM | POA: Diagnosis not present

## 2017-07-13 DIAGNOSIS — C562 Malignant neoplasm of left ovary: Secondary | ICD-10-CM

## 2017-07-13 DIAGNOSIS — Z7189 Other specified counseling: Secondary | ICD-10-CM

## 2017-07-13 DIAGNOSIS — G620 Drug-induced polyneuropathy: Secondary | ICD-10-CM | POA: Diagnosis not present

## 2017-07-13 DIAGNOSIS — C561 Malignant neoplasm of right ovary: Secondary | ICD-10-CM

## 2017-07-13 DIAGNOSIS — R4702 Dysphasia: Secondary | ICD-10-CM

## 2017-07-13 DIAGNOSIS — C800 Disseminated malignant neoplasm, unspecified: Secondary | ICD-10-CM

## 2017-07-13 DIAGNOSIS — I517 Cardiomegaly: Secondary | ICD-10-CM | POA: Diagnosis not present

## 2017-07-13 DIAGNOSIS — I639 Cerebral infarction, unspecified: Secondary | ICD-10-CM | POA: Diagnosis not present

## 2017-07-13 DIAGNOSIS — T451X5A Adverse effect of antineoplastic and immunosuppressive drugs, initial encounter: Secondary | ICD-10-CM

## 2017-07-13 DIAGNOSIS — C563 Malignant neoplasm of bilateral ovaries: Secondary | ICD-10-CM

## 2017-07-13 MED ORDER — DEXAMETHASONE 4 MG PO TABS: ORAL_TABLET | ORAL | 1 refills | Status: DC

## 2017-07-13 MED ORDER — PROCHLORPERAZINE MALEATE 10 MG PO TABS: 10.0000 mg | ORAL_TABLET | Freq: Four times a day (QID) | ORAL | 1 refills | Status: DC | PRN

## 2017-07-13 MED ORDER — ONDANSETRON HCL 8 MG PO TABS: ORAL_TABLET | ORAL | 1 refills | Status: DC

## 2017-07-13 NOTE — Progress Notes (Signed)
START ON PATHWAY REGIMEN - Ovarian     A cycle is every 21 days:     Paclitaxel      Carboplatin   **Always confirm dose/schedule in your pharmacy ordering system**    Patient Characteristics: Recurrent or Progressive Disease, Second Line Therapy, > 12 Months AJCC T Category: T3 AJCC N Category: N0 AJCC M Category: M0 Therapeutic Status: Recurrent or Progressive Disease AJCC 8 Stage Grouping: Unknown Line of Therapy: Second Line BRCA Mutation Status: Absent Would you be surprised if this patient died  in the next year? I would be surprised if this patient died in the next year Progression on neoadjuvant therapy? No Time since last treatment: > 12 Months Intent of Therapy: Non-Curative / Palliative Intent, Discussed with Patient

## 2017-07-13 NOTE — Telephone Encounter (Signed)
Gave patient avs and August calendar.  °

## 2017-07-15 ENCOUNTER — Encounter: Payer: Self-pay | Admitting: Hematology and Oncology

## 2017-07-15 DIAGNOSIS — Z7189 Other specified counseling: Secondary | ICD-10-CM | POA: Insufficient documentation

## 2017-07-15 NOTE — Assessment & Plan Note (Signed)
The patient has persistent right-sided weakness She is able to function reasonably well with assistance at home

## 2017-07-15 NOTE — Assessment & Plan Note (Signed)
Unfortunately, the patient has recurrence of disease Previously, she had complete response with carboplatin and Taxol I would favor we repeat similar combination chemotherapy but to give it every [redacted] weeks along with Neulasta support. I would not favor giving her doxorubicin in view of enlarged heart noted on imaging study and chronic A. fib fibrillation culminating into stroke and persistent right hemiplegia After 3 cycles of treatment, I will repeat imaging study to assess response to treatment If she have good response to treatment, we will consider maintenance treatment with PARP inhibitor I will order tumor marker CA-125 every 4 weeks to assess as this tumor marker has been helpful in the past

## 2017-07-15 NOTE — Progress Notes (Signed)
[place] progress notes  Patient Care Team: Helane Rima, MD as PCP - General (Family Medicine) Encarnacion Slates, MD as Referring Physician (Neurology)  CHIEF COMPLAINTS/PURPOSE OF VISIT:  Recurrent ovarian cancer  HISTORY OF PRESENTING ILLNESS:  Brenda Cunningham 66 y.o. female was transferred to my care after her prior physician has left.  I reviewed the patient's records extensive and collaborated the history with the patient. Summary of her history is as follows: Oncology History   Negative genetic testing     Ovarian cancer ([place])   11/28/2015 Imaging    Ct scan of abdomen: Peritoneal carcinomatosis and pelvic/inguinal adenopathy. Gynecologic primary is favored.      12/17/2015 Pathology Results    Lymph node, needle/core biopsy, L inguinal LAN - METASTATIC ADENOCARCINOMA. Microscopic Comment Immunohistochemistry will be performed and reported as an addendum. (JDP:ecj 12/18/2015) ADDENDUM: Immunohistochemistry shows strong positivity with cytokeratin 7, estrogen receptor (ER), progesterone receptor (PR), p53 and WT1. Negative markers are cytokeratin 20 , CDX- 2 and gross cystic disease fluid protein. 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 (JDP:kh 12-19-15      12/17/2015 Procedure    Technically successful ultrasound guided core left inguinal adenopathy biopsy      12/24/2015 Tumor Marker    Patient's tumor was tested for the following markers: CA-125 Results of the tumor marker test revealed 608.2      12/27/2015 - 02/28/2016 Chemotherapy    She received neoadjuvant chemo x 3 cycles      01/01/2016 Tumor Marker    Patient's tumor was tested for the following markers: CA-125 Results of the tumor marker test revealed 741.6      01/29/2016 Procedure    Successful placement of a right internal jugular approach power injectable Port-A-Cath. The catheter  is ready for immediate use      01/29/2016 Imaging    US abdomen 1. Hyperechoic 2.5 x 1.0 x 1.6 cm lesion in the region the caudate lobe of the liver. Similar finding noted on prior CT of 11/28/2015. This could represent hemangioma. This could represent a malignancy including metastatic disease. 2. Exam otherwise unremarkable. No gallstones. No biliary distention.      02/03/2016 Tumor Marker    Patient's tumor was tested for the following markers: CA-125 Results of the tumor marker test revealed 137.1      02/20/2016 Imaging    MRI brain No acute infarct.  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  No intracranial mass.  Moderate small vessel disease changes.  Global atrophy without hydrocephalus.  Expanded partially empty sella without secondary findings of pseudotumor cerebri.  Minimal mucosal thickening ethmoid sinus air cells. Small air-fluid levels maxillary sinuses bilaterally may indicate changes of acute sinusitis.       02/28/2016 Tumor Marker    Patient's tumor was tested for the following markers: CA-125 Results of the tumor marker test revealed 33.6      03/02/2016 Imaging    CT chest, abdomen and pelvis 1. Today's study demonstrates a positive response to therapy with resolution of the previously noted malignant ascites, significant regression of previously noted peritoneal implants, and regression of previously noted lymphadenopathy in the abdomen and pelvis. 2. No definite evidence to suggest metastatic disease to the thorax. 3. Stable 1.0 x 1.7 cm intermediate attenuation lesion associated with the posterior aspect of segment 1 of the liver, favored to represent a mildly proteinaceous hepatic cyst. The possibility of  a peritoneal implant in this region is not entirely excluded, but is not favored on today's examination. 4. Extensive post infectious scarring inguinal upper right lung, likely sequela of prior necrotizing pneumonia. 5.  Multiple tiny pulmonary nodules scattered throughout the lungs bilaterally all measuring 4 mm or less. These are nonspecific, but favored to be benign. Given the patient's history of primary gynecologic malignancy, attention on followup studies is recommended to ensure the stability of these findings. 6. Additional incidental findings, as above      03/14/2016 Imaging    CT angiogram 1. No pulmonary emboli. 2. Chronic changes to the right upper lobe. 3. Stable lesion in the caudate lobe of the liver. 4. Stable soft tissue prominence to the right of the trachea as described above. 5. Stable small nodule in the right lung.       03/24/2016 Pathology Results    1. Omentum, resection for tumor - FOCAL ADENOCARCINOMA ASSOCIATED WITH EXTENSIVE FIBROSIS, INFLAMMATION AND HEMOSIDERIN DEPOSITION. 2. Uterus +/- tubes/ovaries, neoplastic, cervix - CERVIX: SLIGHT CERVICITIS AND SQUAMOUS METAPLASIA. - ENDOMETRIUM: ATROPHIC, NO HYPERPLASIA OR MALIGNANCY. - MYOMETRIUM: LEIOMYOMATA WITH DEGENERATIVE CHANGES. NO MALIGNANCY. - UTERINE SEROSA: FOCAL ADENOCARCINOMA ASSOCIATED WITH ADHESIONS. - RIGHT OVARY: BENIGN CALCIFIED NODULE AND BENIGN SEROUS CYST. NO MALIGNANCY IDENTIFIED. - RIGHT FALLOPIAN TUBE: UNREMARKABLE. NO MALIGNANCY IDENTIFIED. - LEFT OVARY: FOCAL ADENOCARCINOMA ASSOCIATED WITH EXTENSIVE FIBROSIS. - LEFT FALLOPIAN TUBE: HYDROSALPINX. NO MALIGNANCY IDENTIFIED. 3. Lymph node, biopsy, right external iliac - METASTATIC ADENOCARCINOMA, 4. Soft tissue, biopsy, left para colic gutter - FOCAL ADENOCARCINOMA ASSOCIATED WITH FIBROSIS. Microscopic Comment 2. OVARY Specimen(s): Uterus with bilateral ovaries, omentum, right external iliac lymph node and left paracolic gutter. Procedure: (including lymph node sampling) Hysterectomy with bilateral salpingo-oophorectomy, omentectomy, lymph node biopsy and paracolic gutter biopsy. Primary tumor site (including laterality): Left ovary. Ovarian surface  involvement: Yes Ovarian capsule intact without fragmentation: N/A Maximum tumor size (cm): 2 cm, 2 cm Histologic type: Serous carcinoma. Grade: 3 Peritoneal implants: (specify invasive or non-invasive): Left paracolic gutter positive for carcinoma Pelvic extension (list additional structures on separate lines and if involved): Omentum, right paracolic gutter and uterine serosa. Lymph nodes: number examined 1 ; number positive 1 TNM code: ypT3a, ypN1b, ypMX FIGO Stage (based on pathologic findings, needs clinical correlation): IIIA2 Comments: The left ovary has a 2 cm nodule, which is composed primarily of fibrous tissue with small foci of residual adenocarcinoma. There is also microscopic involvement of the uterine serosa, the left paracolic gutter biopsy and the omentum. The left paracolic gutter and omental involvement is microscopic and both are associated with extensive fibrosis with focal hemosiderin deposition. The right external iliac node is completely replaced by metastatic adenocarcinoma with serous features. (JDP:ecj 03/30/2016)      03/24/2016 Surgery    Operation: Robotic-assisted laparoscopic total hysterectomy with bilateral salpingoophorectomy, omentectomy, radical tumor debulking  Surgeon: Donaciano Eva  Operative Findings:  : small nodules in omentum, no ascites. Omental nodule (2cm) adherent to lower anterior abdominal wall. 2cm left colonic gutter cystic nodule. 6cm right external iliac lymph node. Grossly normal ovaries and tubes. Fibroid uterus. No gross residual disease at completion of surgery representing R0 optimal/complete resection.       04/17/2016 Imaging    CT abdomen and pelvis 1. There is no evidence of acute inflammatory process within abdomen or pelvis. 2. Stable low-density lesion within caudate lobe of the liver. No new hepatic lesions are noted. 3. Again noted lobulated renal contour and multifocal renal cortical scarring. No hydronephrosis or  hydroureter. 4. No significant mesenteric adenopathy. No retroperitoneal adenopathy. Stable minimal residual peritoneal thickening within pelvis. No new pelvic implants or pelvic ascites. 5. Status post hysterectomy.  Unremarkable urinary bladder. 6. Moderate stool within colon as described above. No evidence of colitis.       04/24/2016 - 06/19/2016 Chemotherapy    She received 3 more cycles of chemo after surgery      05/14/2016 Tumor Marker    Patient's tumor was tested for the following markers: CA-125 Results of the tumor marker test revealed 22.5      06/04/2016 Tumor Marker    Patient's tumor was tested for the following markers: CA-125 Results of the tumor marker test revealed 13.7      07/07/2016 Genetic Testing    Patient has genetic testing done for breast/ovasrian cancer panel Results revealed patient has no actionable mutation      07/23/2016 Imaging    Ct abdomen and pelvis 1. Heterogeneously enhancing 4.9 x 3.2 x 4.4 cm mass along the right pelvic sidewall may represent locally recurrent disease or malignant lymphadenopathy. 2. No other signs of definite metastatic disease noted elsewhere in the abdomen or pelvis. 3. Stable low-attenuation hepatic lesion in the caudate lobe of the liver, which appears to demonstrates some progressive centripetal filling on delayed images. This lesion is presumably benign given its stability compared to prior studies, and is favored to represent a small cavernous hemangioma. 4. Cardiomegaly with biatrial dilatation, which is very severe on the right side. 5. Aortic atherosclerosis. 6. Additional incidental findings, as above.      07/23/2016 Tumor Marker    Patient's tumor was tested for the following markers: CA-125 Results of the tumor marker test revealed 12.3      09/30/2016 Imaging    Ct abdomen and pelvis: 1. 4.9 cm heterogeneously enhancing mass seen along the right pelvic sidewall previously has almost completely resolved.  There is some ill-defined soft tissue attenuation/peritoneal thickening in this region today but no discrete measurable lesion is evident. 2. No new or progressive findings on today's exam. 3. Stable hypo attenuating lesion in the caudate lobe of the liver. 4. Subtle aortic atherosclerosis      09/30/2016 Tumor Marker    Patient's tumor was tested for the following markers: CA-125 Results of the tumor marker test revealed 9.6      10/05/2016 Pathology Results    Vagina, biopsy, left cuff - BENIGN FIBROEPITHELIAL (STROMAL) POLYP. - NO DYSPLASIA, ATYPIA OR MALIGNANCY IDENTIFIED.      01/14/2017 Tumor Marker    Patient's tumor was tested for the following markers: CA-125 Results of the tumor marker test revealed 11.9      05/05/2017 Tumor Marker    Patient's tumor was tested for the following markers: CA-125 Results of the tumor marker test revealed 28      06/14/2017 Tumor Marker    Patient's tumor was tested for the following markers: CA-125 Results of the tumor marker test revealed 45.7      06/24/2017 Imaging    Ct abdomen and pelvis: Increased peritoneal metastatic disease in abdomen and pelvis since prior exam. No evidence of ascites. Stable small benign hepatic hemangioma.      The patient is here accompanied by her daughter, Brenda Cunningham and [name], her spouse Despite recent diagnosis of recurrence of cancer, she is doing well She has chronic constipation but denies worsening nausea, abdominal bloating or pain With her prior stroke, she has persistent speech impairment, occasional expressive dysphasia and mild right-sided weakness  especially in the right upper site She denies choking or dysphagia. She denies recent falls No recent seizures She is on chronic anticoagulation therapy for chronic atrial fibrillation The patient denies any recent signs or symptoms of bleeding such as spontaneous epistaxis, hematuria or hematochezia.   MEDICAL HISTORY:  Past Medical History:   Diagnosis Date  . A-fib ([place])   . Anxiety   . Arthritis   . Breast cancer ([place]) 2008   s/p lumpectomy, chemo and radiation therapy  . Complication of anesthesia    DIFFICULTY WAKING UP FROM ANESTHESIA  . Hyperlipidemia   . Hypertension   . Stroke ([place]) 07/29/2015  . Wears glasses     SURGICAL HISTORY: Past Surgical History:  Procedure Laterality Date  . APPENDECTOMY    . BREAST LUMPECTOMY  2008   left  . COLONOSCOPY  2004  . DEBULKING N/A 03/24/2016   Procedure: RADICAL TUMOR DEBULKING;  Surgeon: Everitt Amber, MD;  Location: [facility];  Service: Gynecology;  Laterality: N/A;  . OMENTECTOMY N/A 03/24/2016   Procedure: OMENTECTOMY;  Surgeon: Everitt Amber, MD;  Location: [facility];  Service: Gynecology;  Laterality: N/A;  . ROBOTIC ASSISTED TOTAL HYSTERECTOMY WITH BILATERAL SALPINGO OOPHERECTOMY Bilateral 03/24/2016   Procedure: XI ROBOTIC ASSISTED TOTA LAPAROSCOPIC  HYSTERECTOMY WITH BILATERAL SALPINGO OOPHORECTOMY;  Surgeon: Everitt Amber, MD;  Location: [facility];  Service: Gynecology;  Laterality: Bilateral;  . TUBAL LIGATION      SOCIAL HISTORY: Social History   Social History  . Marital status: Single    Spouse name: N/A  . Number of children: N/A  . Years of education: N/A   Occupational History  . Not on file.   Social History Main Topics  . Smoking status: Never Smoker  . Smokeless tobacco: Never Used  . Alcohol use Yes     Comment: occ beer or glass of wine  . Drug use: No  . Sexual activity: Not on file   Other Topics Concern  . Not on file   Social History Narrative   Lives with her female friend, Mina Marble, walks for exercise.  Originally from [place].    FAMILY HISTORY: Family History  Problem Relation Age of Onset  . Cirrhosis Father        +heavy EtOH  . Diabetes Maternal Uncle   . Other Daughter        hx benign breast bx    ALLERGIES:  is allergic to codeine and latex.  MEDICATIONS:  Current Outpatient Prescriptions  Medication Sig Dispense Refill   . apixaban (ELIQUIS) 5 MG TABS tablet Take 1 tablet (5 mg total) by mouth 2 (two) times daily. 60 tablet 5  . aspirin EC 81 MG tablet Take 81 mg by mouth daily.    Marland Kitchen atorvastatin (LIPITOR) 40 MG tablet Take 40 mg by mouth at bedtime.    Marland Kitchen dexamethasone (DECADRON) 4 MG tablet Take 5 tabs at 10 pm the night before chemo and 5 tabs at 6 am the day of chemo, with food, by mouth 30 tablet 1  . lidocaine-prilocaine (EMLA) cream Apply to Porta-Cath 1-2 hours prior to access as directed. 30 g 1  . loratadine (CLARITIN) 10 MG tablet Take 10 mg by mouth daily as needed (Recommended for chemo pain). Reported on 03/16/2016    . mirtazapine (REMERON) 15 MG tablet Take 15 mg by mouth at bedtime.    Marland Kitchen omega-3 acid ethyl esters (LOVAZA) 1 g capsule Take 1 g by mouth daily.    . ondansetron (  ZOFRAN) 8 MG tablet Take 1 every 8 hours/as needed for nausea, start after 3rd day of chemo 30 tablet 1  . PARoxetine (PAXIL) 20 MG tablet TAKE 1 TABLET BY MOUTH DAILY (Patient taking differently: TAKE 20 MG BY MOUTH DAILY) 90 tablet 0  . prochlorperazine (COMPAZINE) 10 MG tablet Take 1 tablet (10 mg total) by mouth every 6 (six) hours as needed (Nausea or vomiting). 30 tablet 1  . verapamil (CALAN-SR) 240 MG CR tablet Take 1 tablet (240 mg total) by mouth daily. 30 tablet 0   No current facility-administered medications for this visit.     REVIEW OF SYSTEMS:   Constitutional: Denies fevers, chills or abnormal night sweats Eyes: Denies blurriness of vision, double vision or watery eyes Ears, nose, mouth, throat, and face: Denies mucositis or sore throat Respiratory: Denies cough, dyspnea or wheezes Cardiovascular: Denies palpitation, chest discomfort or lower extremity swelling Gastrointestinal:  Denies nausea, heartburn or change in bowel habits Skin: Denies abnormal skin rashes Lymphatics: Denies new lymphadenopathy or easy bruising Neurological:Denies numbness, tingling or new weaknesses Behavioral/Psych: Mood is  stable, no new changes  All other systems were reviewed with the patient and are negative.  PHYSICAL EXAMINATION: ECOG PERFORMANCE STATUS: 2 - Symptomatic, <50% confined to bed  Vitals:   07/13/17 1515  BP: (!) 154/98  Pulse: 100  Resp: 17  Temp: 98.7 F (37.1 C)   Filed Weights   07/13/17 1515  Weight: 216 lb 1.6 oz (98 kg)    GENERAL:alert, no distress and comfortable SKIN: skin color, texture, turgor are normal, no rashes or significant lesions EYES: normal, conjunctiva are pink and non-injected, sclera clear OROPHARYNX:no exudate, normal lips, buccal mucosa, and tongue  NECK: supple, thyroid normal size, non-tender, without nodularity LYMPH:  no palpable lymphadenopathy in the cervical, axillary or inguinal LUNGS: clear to auscultation and percussion with normal breathing effort HEART: regular rate & rhythm and no murmurs without lower extremity edema ABDOMEN:abdomen soft, non-tender and normal bowel sounds Musculoskeletal:no cyanosis of digits and no clubbing  PSYCH: alert & oriented x 3 with speech difficulties NEURO: Noted focal weakness in the right upper extremity  LABORATORY DATA:  I have reviewed the data as listed Lab Results  Component Value Date   WBC 5.5 12/30/2016   HGB 12.0 12/30/2016   HCT 35.3 12/30/2016   MCV 100.6 12/30/2016   PLT 206 12/30/2016    Recent Labs  09/30/16 0817 12/30/16 1204 06/14/17 1430  NA 142 139 145*  K 3.6 4.0 3.9  CL  --   --  105  CO2 27 27 20   GLUCOSE 100 94 114*  BUN 14.5 14.4 17  CREATININE 0.7 0.8 0.88  CALCIUM 9.5 9.8 9.7  GFRNONAA  --   --  68  GFRAA  --   --  79  PROT 7.1 7.3 7.2  ALBUMIN 3.5 3.8 3.9  AST 22 19 19   ALT 31 26 23   ALKPHOS 119 105 129*  BILITOT 0.44 0.72 <0.2    RADIOGRAPHIC STUDIES: I have personally reviewed the radiological images as listed and agreed with the findings in the report. Ct Abdomen Pelvis W Contrast  Result Date: 06/24/2017 CLINICAL DATA:  Followup bilateral ovarian  carcinoma. Status post chemotherapy. New elevation of CA 125 level. EXAM: CT ABDOMEN AND PELVIS WITH CONTRAST TECHNIQUE: Multidetector CT imaging of the abdomen and pelvis was performed using the standard protocol following bolus administration of intravenous contrast. CONTRAST:  1104m ISOVUE-300 IOPAMIDOL (ISOVUE-300) INJECTION 61% COMPARISON:  09/30/2016 FINDINGS:  Lower Chest: No acute findings. Hepatobiliary: Stable small benign hemangioma in caudate lobe measuring 1.9 cm . No other liver masses identified. Gallbladder is unremarkable. Pancreas:  No mass or inflammatory changes. Spleen: Within normal limits in size and appearance. Adrenals/Urinary Tract: No masses identified. No evidence of hydronephrosis. Stable bilateral renal parenchymal scarring. Unremarkable unopacified urinary bladder. Stomach/Bowel: No evidence of bowel obstruction . 2 small masses are seen along the serosal surface of small bowel loops in right pelvis which are new since previous study. These measure 2.1 cm on image 60/2 and 2.1 cm on image 68/2. Vascular/Lymphatic: Ill-defined soft tissue mass is seen along the right pelvic sidewall which involves adjacent loop of small bowel. This measures 3.3 x 4.7 cm on image 62/2, and is new since previous study. No abdominal aortic aneurysm. Reproductive: Prior hysterectomy. Cystic and solid mass is seen involving the left vaginal cuff measuring 3.1 x 2.7 cm on image 67/2, compared to 2.4 x 2.2 cm previously. No evidence of ascites. Other: Multiple peritoneal soft tissue nodules are seen within the lower abdominal mesentery and right anterior paracolic gutter, which are new or increased in size. Index nodule in right lower quadrant mesenteric measures 1.4 cm on image 52/2 compared to 0.9 cm previously. Index nodule in the right anterior cerebellar gutter measures 1.4 cm on image 62/2, new since prior study. Musculoskeletal:  No suspicious bone lesions identified. IMPRESSION: Increased peritoneal  metastatic disease in abdomen and pelvis since prior exam. No evidence of ascites. Stable small benign hepatic hemangioma. Electronically Signed   By: Earle Gell M.D.   On: 06/24/2017 14:43    ASSESSMENT & PLAN:  Ovarian cancer ([place]) Unfortunately, the patient has recurrence of disease Previously, she had complete response with carboplatin and Taxol I would favor we repeat similar combination chemotherapy but to give it every [redacted] weeks along with Neulasta support. I would not favor giving her doxorubicin in view of enlarged heart noted on imaging study and chronic A. fib fibrillation culminating into stroke and persistent right hemiplegia After 3 cycles of treatment, I will repeat imaging study to assess response to treatment If she have good response to treatment, we will consider maintenance treatment with PARP inhibitor I will order tumor marker CA-125 every 4 weeks to assess as this tumor marker has been helpful in the past  Right hemiparesis ([place]) The patient has persistent right-sided weakness She is able to function reasonably well with assistance at home  Expressive dysphasia The patient has speech impairment from prior stroke She will continue speech and language therapist assessment and treatment  Chronic atrial fibrillation ([place]) She has chronic atrial fibrillation, rate control She is on chronic anticoagulation therapy  Chemotherapy-induced peripheral neuropathy ([place]) She denies significant peripheral neuropathy from prior treatment I recommend treatment every 3 weeks in the future  Goals of care, counseling/discussion The patient is aware she has incurable disease and treatment is strictly palliative. We discussed importance of Advanced Directives and Living will. We discussed CODE STATUS; the patient is undecided. We will have further discussion in our next visit   Orders Placed This Encounter  Procedures  . CBC with Differential    Standing Status:   Standing     Number of Occurrences:   20    Standing Expiration Date:   07/14/2018  . Comprehensive metabolic panel    Standing Status:   Standing    Number of Occurrences:   20    Standing Expiration Date:   07/14/2018  . CA 125  Standing Status:   Standing    Number of Occurrences:   11    Standing Expiration Date:   07/15/2018    All questions were answered. The patient knows to call the clinic with any problems, questions or concerns. I spent 60 minutes counseling the patient face to face. The total time spent in the appointment was 80 minutes and more than 50% was on counseling.     Heath Lark, MD 07/15/2017 7:38 AM

## 2017-07-15 NOTE — Assessment & Plan Note (Signed)
She has chronic atrial fibrillation, rate control She is on chronic anticoagulation therapy 

## 2017-07-15 NOTE — Assessment & Plan Note (Signed)
The patient has speech impairment from prior stroke She will continue speech and language therapist assessment and treatment

## 2017-07-15 NOTE — Assessment & Plan Note (Signed)
The patient is aware she has incurable disease and treatment is strictly palliative. We discussed importance of Advanced Directives and Living will. We discussed CODE STATUS; the patient is undecided. We will have further discussion in our next visit

## 2017-07-15 NOTE — Assessment & Plan Note (Signed)
She denies significant peripheral neuropathy from prior treatment I recommend treatment every 3 weeks in the future

## 2017-07-21 ENCOUNTER — Ambulatory Visit: Payer: Medicare Other

## 2017-07-21 ENCOUNTER — Ambulatory Visit (HOSPITAL_BASED_OUTPATIENT_CLINIC_OR_DEPARTMENT_OTHER): Payer: Medicare Other | Admitting: Hematology and Oncology

## 2017-07-21 ENCOUNTER — Other Ambulatory Visit: Payer: Self-pay | Admitting: Hematology and Oncology

## 2017-07-21 ENCOUNTER — Ambulatory Visit (HOSPITAL_BASED_OUTPATIENT_CLINIC_OR_DEPARTMENT_OTHER): Payer: Medicare Other

## 2017-07-21 ENCOUNTER — Other Ambulatory Visit (HOSPITAL_BASED_OUTPATIENT_CLINIC_OR_DEPARTMENT_OTHER): Payer: Medicare Other

## 2017-07-21 DIAGNOSIS — C563 Malignant neoplasm of bilateral ovaries: Secondary | ICD-10-CM

## 2017-07-21 DIAGNOSIS — G8191 Hemiplegia, unspecified affecting right dominant side: Secondary | ICD-10-CM

## 2017-07-21 DIAGNOSIS — C800 Disseminated malignant neoplasm, unspecified: Secondary | ICD-10-CM

## 2017-07-21 DIAGNOSIS — C562 Malignant neoplasm of left ovary: Secondary | ICD-10-CM

## 2017-07-21 DIAGNOSIS — Z95828 Presence of other vascular implants and grafts: Secondary | ICD-10-CM

## 2017-07-21 DIAGNOSIS — I482 Chronic atrial fibrillation, unspecified: Secondary | ICD-10-CM

## 2017-07-21 DIAGNOSIS — Z5111 Encounter for antineoplastic chemotherapy: Secondary | ICD-10-CM | POA: Diagnosis not present

## 2017-07-21 DIAGNOSIS — C561 Malignant neoplasm of right ovary: Secondary | ICD-10-CM

## 2017-07-21 DIAGNOSIS — Z7189 Other specified counseling: Secondary | ICD-10-CM | POA: Diagnosis not present

## 2017-07-21 LAB — COMPREHENSIVE METABOLIC PANEL
ALT: 40 U/L (ref 0–55)
AST: 31 U/L (ref 5–34)
Albumin: 3.4 g/dL — ABNORMAL LOW (ref 3.5–5.0)
Alkaline Phosphatase: 151 U/L — ABNORMAL HIGH (ref 40–150)
Anion Gap: 12 mEq/L — ABNORMAL HIGH (ref 3–11)
BUN: 16.7 mg/dL (ref 7.0–26.0)
CO2: 21 mEq/L — ABNORMAL LOW (ref 22–29)
Calcium: 10 mg/dL (ref 8.4–10.4)
Chloride: 104 mEq/L (ref 98–109)
Creatinine: 1 mg/dL (ref 0.6–1.1)
EGFR: 64 mL/min/{1.73_m2} — ABNORMAL LOW (ref >90)
Glucose: 260 mg/dl — ABNORMAL HIGH (ref 70–140)
Potassium: 3.8 mEq/L (ref 3.5–5.1)
Sodium: 138 mEq/L (ref 136–145)
Total Bilirubin: 0.55 mg/dL (ref 0.20–1.20)
Total Protein: 8.1 g/dL (ref 6.4–8.3)

## 2017-07-21 LAB — CBC WITH DIFFERENTIAL/PLATELET
BASO%: 0.1 % (ref 0.0–2.0)
Basophils Absolute: 0 10*3/uL (ref 0.0–0.1)
EOS%: 0 % (ref 0.0–7.0)
Eosinophils Absolute: 0 10*3/uL (ref 0.0–0.5)
HCT: 40.5 % (ref 34.8–46.6)
HGB: 13.4 g/dL (ref 11.6–15.9)
LYMPH%: 22.5 % (ref 14.0–49.7)
MCH: 34.5 pg — ABNORMAL HIGH (ref 25.1–34.0)
MCHC: 33.1 g/dL (ref 31.5–36.0)
MCV: 104.3 fL — ABNORMAL HIGH (ref 79.5–101.0)
MONO#: 0 10*3/uL — ABNORMAL LOW (ref 0.1–0.9)
MONO%: 0.6 % (ref 0.0–14.0)
NEUT#: 4 10*3/uL (ref 1.5–6.5)
NEUT%: 76.8 % (ref 38.4–76.8)
Platelets: 223 10*3/uL (ref 145–400)
RBC: 3.88 10*6/uL (ref 3.70–5.45)
RDW: 14.6 % — ABNORMAL HIGH (ref 11.2–14.5)
WBC: 5.2 10*3/uL (ref 3.9–10.3)
lymph#: 1.2 10*3/uL (ref 0.9–3.3)

## 2017-07-21 MED ORDER — FAMOTIDINE IN NACL 20-0.9 MG/50ML-% IV SOLN
INTRAVENOUS | Status: AC
  Filled 2017-07-21: qty 50

## 2017-07-21 MED ORDER — HEPARIN SOD (PORK) LOCK FLUSH 100 UNIT/ML IV SOLN
500.0000 [IU] | Freq: Once | INTRAVENOUS | Status: AC | PRN
  Administered 2017-07-21: 500 [IU]
  Filled 2017-07-21: qty 5

## 2017-07-21 MED ORDER — PALONOSETRON HCL INJECTION 0.25 MG/5ML
0.2500 mg | Freq: Once | INTRAVENOUS | Status: AC
  Administered 2017-07-21: 0.25 mg via INTRAVENOUS

## 2017-07-21 MED ORDER — DIPHENHYDRAMINE HCL 50 MG/ML IJ SOLN
50.0000 mg | Freq: Once | INTRAMUSCULAR | Status: AC
  Administered 2017-07-21: 50 mg via INTRAVENOUS

## 2017-07-21 MED ORDER — FAMOTIDINE IN NACL 20-0.9 MG/50ML-% IV SOLN
20.0000 mg | Freq: Once | INTRAVENOUS | Status: AC
  Administered 2017-07-21: 20 mg via INTRAVENOUS

## 2017-07-21 MED ORDER — SODIUM CHLORIDE 0.9% FLUSH
10.0000 mL | Freq: Once | INTRAVENOUS | Status: AC
  Administered 2017-07-21: 10 mL
  Filled 2017-07-21: qty 10

## 2017-07-21 MED ORDER — PALONOSETRON HCL INJECTION 0.25 MG/5ML
INTRAVENOUS | Status: AC
  Filled 2017-07-21: qty 5

## 2017-07-21 MED ORDER — DIPHENHYDRAMINE HCL 50 MG/ML IJ SOLN
INTRAMUSCULAR | Status: AC
  Filled 2017-07-21: qty 1

## 2017-07-21 MED ORDER — SODIUM CHLORIDE 0.9 % IV SOLN
175.0000 mg/m2 | Freq: Once | INTRAVENOUS | Status: AC
  Administered 2017-07-21: 372 mg via INTRAVENOUS
  Filled 2017-07-21: qty 62

## 2017-07-21 MED ORDER — SODIUM CHLORIDE 0.9 % IV SOLN
Freq: Once | INTRAVENOUS | Status: AC
  Administered 2017-07-21: 10:00:00 via INTRAVENOUS

## 2017-07-21 MED ORDER — SODIUM CHLORIDE 0.9 % IV SOLN
657.0000 mg | Freq: Once | INTRAVENOUS | Status: AC
  Administered 2017-07-21: 660 mg via INTRAVENOUS
  Filled 2017-07-21: qty 66

## 2017-07-21 MED ORDER — SODIUM CHLORIDE 0.9 % IV SOLN
Freq: Once | INTRAVENOUS | Status: AC
  Administered 2017-07-21: 10:00:00 via INTRAVENOUS
  Filled 2017-07-21: qty 5

## 2017-07-21 MED ORDER — PEGFILGRASTIM 6 MG/0.6ML ~~LOC~~ PSKT
6.0000 mg | PREFILLED_SYRINGE | Freq: Once | SUBCUTANEOUS | Status: AC
  Administered 2017-07-21: 6 mg via SUBCUTANEOUS
  Filled 2017-07-21: qty 0.6

## 2017-07-21 MED ORDER — SODIUM CHLORIDE 0.9% FLUSH
10.0000 mL | INTRAVENOUS | Status: DC | PRN
  Administered 2017-07-21: 10 mL
  Filled 2017-07-21: qty 10

## 2017-07-21 NOTE — Patient Instructions (Signed)
[facility] Discharge Instructions for Patients Receiving Chemotherapy  Today you received the following chemotherapy agents: Taxol and Carboplatin.  To help prevent nausea and vomiting after your treatment, we encourage you to take your nausea medication: Compazine 10 mg every 6 hours as needed; Zofran 8 mg every 8 hours as needed.   If you develop nausea and vomiting that is not controlled by your nausea medication, call the clinic.   BELOW ARE SYMPTOMS THAT SHOULD BE REPORTED IMMEDIATELY:  *FEVER GREATER THAN 100.5 F  *CHILLS WITH OR WITHOUT FEVER  NAUSEA AND VOMITING THAT IS NOT CONTROLLED WITH YOUR NAUSEA MEDICATION  *UNUSUAL SHORTNESS OF BREATH  *UNUSUAL BRUISING OR BLEEDING  TENDERNESS IN MOUTH AND THROAT WITH OR WITHOUT PRESENCE OF ULCERS  *URINARY PROBLEMS  *BOWEL PROBLEMS  UNUSUAL RASH Items with * indicate a potential emergency and should be followed up as soon as possible.  Feel free to call the clinic you have any questions or concerns. The clinic phone number is [phone].  Please show the CHEMO ALERT CARD at check-in to the Emergency Department and triage nurse.   

## 2017-07-22 ENCOUNTER — Encounter: Payer: Self-pay | Admitting: Hematology and Oncology

## 2017-07-22 LAB — CA 125: Cancer Antigen (CA) 125: 84.1 U/mL — ABNORMAL HIGH (ref 0.0–38.1)

## 2017-07-22 NOTE — Assessment & Plan Note (Signed)
The patient has persistent right-sided weakness She is able to function reasonably well with assistance at home

## 2017-07-22 NOTE — Assessment & Plan Note (Signed)
She has chronic atrial fibrillation, rate control She is on chronic anticoagulation therapy The patient denies any recent signs or symptoms of bleeding such as spontaneous epistaxis, hematuria or hematochezia.

## 2017-07-22 NOTE — Assessment & Plan Note (Signed)
The patient is aware she has incurable disease and treatment is strictly palliative. We discussed importance of Advanced Directives and Living will. We discussed CODE STATUS; the patient desire full code

## 2017-07-22 NOTE — Assessment & Plan Note (Signed)
Unfortunately, the patient has recurrence of disease Previously, she had complete response with carboplatin and Taxol I would favor we repeat similar combination chemotherapy but to give it every [redacted] weeks along with Neulasta support. I would not favor giving her doxorubicin in view of enlarged heart noted on imaging study and chronic A. fib fibrillation culminating into stroke and persistent right hemiplegia After 3 cycles of treatment, I will repeat imaging study to assess response to treatment If she have good response to treatment, we will consider maintenance treatment with PARP inhibitor I will order tumor marker CA-125 every 4 weeks to assess as this tumor marker has been helpful in the past The patient is reminded to take premedication before each cycle of treatment. I will see her back prior to cycle 2 of therapy for assessment of toxicity.

## 2017-07-22 NOTE — Progress Notes (Signed)
[place] OFFICE PROGRESS NOTE  Patient Care Team: Helane Rima, MD as PCP - General (Family Medicine) Encarnacion Slates, MD as Referring Physician (Neurology)  SUMMARY OF ONCOLOGIC HISTORY: Oncology History   Negative genetic testing     Ovarian cancer [facility])   11/28/2015 Imaging    Ct scan of abdomen: Peritoneal carcinomatosis and pelvic/inguinal adenopathy. Gynecologic primary is favored.      12/17/2015 Pathology Results    Lymph node, needle/core biopsy, L inguinal LAN - METASTATIC ADENOCARCINOMA. Microscopic Comment Immunohistochemistry will be performed and reported as an addendum. (JDP:ecj 12/18/2015) ADDENDUM: Immunohistochemistry shows strong positivity with cytokeratin 7, estrogen receptor (ER), progesterone receptor (PR), p53 and WT1. Negative markers are cytokeratin 20 , CDX- 2 and gross cystic disease fluid protein. 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 (JDP:kh 12-19-15      12/17/2015 Procedure    Technically successful ultrasound guided core left inguinal adenopathy biopsy      12/24/2015 Tumor Marker    Patient's tumor was tested for the following markers: CA-125 Results of the tumor marker test revealed 608.2      12/27/2015 - 02/28/2016 Chemotherapy    She received neoadjuvant chemo x 3 cycles      01/01/2016 Tumor Marker    Patient's tumor was tested for the following markers: CA-125 Results of the tumor marker test revealed 741.6      01/29/2016 Procedure    Successful placement of a right internal jugular approach power injectable Port-A-Cath. The catheter is ready for immediate use      01/29/2016 Imaging    US abdomen 1. Hyperechoic 2.5 x 1.0 x 1.6 cm lesion in the region the caudate lobe of the liver. Similar finding noted on prior CT of 11/28/2015. This could represent hemangioma. This could represent a malignancy including metastatic  disease. 2. Exam otherwise unremarkable. No gallstones. No biliary distention.      02/03/2016 Tumor Marker    Patient's tumor was tested for the following markers: CA-125 Results of the tumor marker test revealed 137.1      02/20/2016 Imaging    MRI brain No acute infarct.  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  No intracranial mass.  Moderate small vessel disease changes.  Global atrophy without hydrocephalus.  Expanded partially empty sella without secondary findings of pseudotumor cerebri.  Minimal mucosal thickening ethmoid sinus air cells. Small air-fluid levels maxillary sinuses bilaterally may indicate changes of acute sinusitis.       02/28/2016 Tumor Marker    Patient's tumor was tested for the following markers: CA-125 Results of the tumor marker test revealed 33.6      03/02/2016 Imaging    CT chest, abdomen and pelvis 1. Today's study demonstrates a positive response to therapy with resolution of the previously noted malignant ascites, significant regression of previously noted peritoneal implants, and regression of previously noted lymphadenopathy in the abdomen and pelvis. 2. No definite evidence to suggest metastatic disease to the thorax. 3. Stable 1.0 x 1.7 cm intermediate attenuation lesion associated with the posterior aspect of segment 1 of the liver, favored to represent a mildly proteinaceous hepatic cyst. The possibility of a peritoneal implant in this region is not entirely excluded, but is not favored on today's examination. 4. Extensive post infectious scarring inguinal upper right lung, likely sequela of prior necrotizing pneumonia. 5. Multiple tiny pulmonary nodules scattered throughout the lungs bilaterally all measuring 4 mm or  less. These are nonspecific, but favored to be benign. Given the patient's history of primary gynecologic malignancy, attention on followup studies is recommended to ensure the stability of these  findings. 6. Additional incidental findings, as above      03/14/2016 Imaging    CT angiogram 1. No pulmonary emboli. 2. Chronic changes to the right upper lobe. 3. Stable lesion in the caudate lobe of the liver. 4. Stable soft tissue prominence to the right of the trachea as described above. 5. Stable small nodule in the right lung.       03/24/2016 Pathology Results    1. Omentum, resection for tumor - FOCAL ADENOCARCINOMA ASSOCIATED WITH EXTENSIVE FIBROSIS, INFLAMMATION AND HEMOSIDERIN DEPOSITION. 2. Uterus +/- tubes/ovaries, neoplastic, cervix - CERVIX: SLIGHT CERVICITIS AND SQUAMOUS METAPLASIA. - ENDOMETRIUM: ATROPHIC, NO HYPERPLASIA OR MALIGNANCY. - MYOMETRIUM: LEIOMYOMATA WITH DEGENERATIVE CHANGES. NO MALIGNANCY. - UTERINE SEROSA: FOCAL ADENOCARCINOMA ASSOCIATED WITH ADHESIONS. - RIGHT OVARY: BENIGN CALCIFIED NODULE AND BENIGN SEROUS CYST. NO MALIGNANCY IDENTIFIED. - RIGHT FALLOPIAN TUBE: UNREMARKABLE. NO MALIGNANCY IDENTIFIED. - LEFT OVARY: FOCAL ADENOCARCINOMA ASSOCIATED WITH EXTENSIVE FIBROSIS. - LEFT FALLOPIAN TUBE: HYDROSALPINX. NO MALIGNANCY IDENTIFIED. 3. Lymph node, biopsy, right external iliac - METASTATIC ADENOCARCINOMA, 4. Soft tissue, biopsy, left para colic gutter - FOCAL ADENOCARCINOMA ASSOCIATED WITH FIBROSIS. Microscopic Comment 2. OVARY Specimen(s): Uterus with bilateral ovaries, omentum, right external iliac lymph node and left paracolic gutter. Procedure: (including lymph node sampling) Hysterectomy with bilateral salpingo-oophorectomy, omentectomy, lymph node biopsy and paracolic gutter biopsy. Primary tumor site (including laterality): Left ovary. Ovarian surface involvement: Yes Ovarian capsule intact without fragmentation: N/A Maximum tumor size (cm): 2 cm, 2 cm Histologic type: Serous carcinoma. Grade: 3 Peritoneal implants: (specify invasive or non-invasive): Left paracolic gutter positive for carcinoma Pelvic extension (list additional  structures on separate lines and if involved): Omentum, right paracolic gutter and uterine serosa. Lymph nodes: number examined 1 ; number positive 1 TNM code: ypT3a, ypN1b, ypMX FIGO Stage (based on pathologic findings, needs clinical correlation): IIIA2 Comments: The left ovary has a 2 cm nodule, which is composed primarily of fibrous tissue with small foci of residual adenocarcinoma. There is also microscopic involvement of the uterine serosa, the left paracolic gutter biopsy and the omentum. The left paracolic gutter and omental involvement is microscopic and both are associated with extensive fibrosis with focal hemosiderin deposition. The right external iliac node is completely replaced by metastatic adenocarcinoma with serous features. (JDP:ecj 03/30/2016)      03/24/2016 Surgery    Operation: Robotic-assisted laparoscopic total hysterectomy with bilateral salpingoophorectomy, omentectomy, radical tumor debulking  Surgeon: Donaciano Eva  Operative Findings:  : small nodules in omentum, no ascites. Omental nodule (2cm) adherent to lower anterior abdominal wall. 2cm left colonic gutter cystic nodule. 6cm right external iliac lymph node. Grossly normal ovaries and tubes. Fibroid uterus. No gross residual disease at completion of surgery representing R0 optimal/complete resection.       04/17/2016 Imaging    CT abdomen and pelvis 1. There is no evidence of acute inflammatory process within abdomen or pelvis. 2. Stable low-density lesion within caudate lobe of the liver. No new hepatic lesions are noted. 3. Again noted lobulated renal contour and multifocal renal cortical scarring. No hydronephrosis or hydroureter. 4. No significant mesenteric adenopathy. No retroperitoneal adenopathy. Stable minimal residual peritoneal thickening within pelvis. No new pelvic implants or pelvic ascites. 5. Status post hysterectomy.  Unremarkable urinary bladder. 6. Moderate stool within colon as  described above. No evidence of colitis.  04/24/2016 - 06/19/2016 Chemotherapy    She received 3 more cycles of chemo after surgery      05/14/2016 Tumor Marker    Patient's tumor was tested for the following markers: CA-125 Results of the tumor marker test revealed 22.5      06/04/2016 Tumor Marker    Patient's tumor was tested for the following markers: CA-125 Results of the tumor marker test revealed 13.7      07/07/2016 Genetic Testing    Patient has genetic testing done for breast/ovasrian cancer panel Results revealed patient has no actionable mutation      07/23/2016 Imaging    Ct abdomen and pelvis 1. Heterogeneously enhancing 4.9 x 3.2 x 4.4 cm mass along the right pelvic sidewall may represent locally recurrent disease or malignant lymphadenopathy. 2. No other signs of definite metastatic disease noted elsewhere in the abdomen or pelvis. 3. Stable low-attenuation hepatic lesion in the caudate lobe of the liver, which appears to demonstrates some progressive centripetal filling on delayed images. This lesion is presumably benign given its stability compared to prior studies, and is favored to represent a small cavernous hemangioma. 4. Cardiomegaly with biatrial dilatation, which is very severe on the right side. 5. Aortic atherosclerosis. 6. Additional incidental findings, as above.      07/23/2016 Tumor Marker    Patient's tumor was tested for the following markers: CA-125 Results of the tumor marker test revealed 12.3      09/30/2016 Imaging    Ct abdomen and pelvis: 1. 4.9 cm heterogeneously enhancing mass seen along the right pelvic sidewall previously has almost completely resolved. There is some ill-defined soft tissue attenuation/peritoneal thickening in this region today but no discrete measurable lesion is evident. 2. No new or progressive findings on today's exam. 3. Stable hypo attenuating lesion in the caudate lobe of the liver. 4. Subtle aortic  atherosclerosis      09/30/2016 Tumor Marker    Patient's tumor was tested for the following markers: CA-125 Results of the tumor marker test revealed 9.6      10/05/2016 Pathology Results    Vagina, biopsy, left cuff - BENIGN FIBROEPITHELIAL (STROMAL) POLYP. - NO DYSPLASIA, ATYPIA OR MALIGNANCY IDENTIFIED.      01/14/2017 Tumor Marker    Patient's tumor was tested for the following markers: CA-125 Results of the tumor marker test revealed 11.9      05/05/2017 Tumor Marker    Patient's tumor was tested for the following markers: CA-125 Results of the tumor marker test revealed 28      06/14/2017 Tumor Marker    Patient's tumor was tested for the following markers: CA-125 Results of the tumor marker test revealed 45.7      06/24/2017 Imaging    Ct abdomen and pelvis: Increased peritoneal metastatic disease in abdomen and pelvis since prior exam. No evidence of ascites. Stable small benign hepatic hemangioma.      07/21/2017 Tumor Marker    Patient's tumor was tested for the following markers: CA-125 Results of the tumor marker test revealed 84.1      07/21/2017 -  Chemotherapy    She received carboplatin and taxol       INTERVAL HISTORY: Please see below for problem oriented charting. She returns prior to cycle 1 of treatment She has no new symptoms since I saw her She is coping well with her stroke No new neurological deficit Denies abdominal pain, bloating or changes in bowel habits.  REVIEW OF SYSTEMS:   Constitutional: Denies fevers,  chills or abnormal weight loss Eyes: Denies blurriness of vision Ears, nose, mouth, throat, and face: Denies mucositis or sore throat Respiratory: Denies cough, dyspnea or wheezes Cardiovascular: Denies palpitation, chest discomfort or lower extremity swelling Gastrointestinal:  Denies nausea, heartburn or change in bowel habits Skin: Denies abnormal skin rashes Lymphatics: Denies new lymphadenopathy or easy  bruising Neurological:Denies numbness, tingling or new weaknesses Behavioral/Psych: Mood is stable, no new changes  All other systems were reviewed with the patient and are negative.  I have reviewed the past medical history, past surgical history, social history and family history with the patient and they are unchanged from previous note.  ALLERGIES:  is allergic to codeine and latex.  MEDICATIONS:  Current Outpatient Prescriptions  Medication Sig Dispense Refill  . apixaban (ELIQUIS) 5 MG TABS tablet Take 1 tablet (5 mg total) by mouth 2 (two) times daily. 60 tablet 5  . aspirin EC 81 MG tablet Take 81 mg by mouth daily.    Marland Kitchen atorvastatin (LIPITOR) 40 MG tablet Take 40 mg by mouth at bedtime.    Marland Kitchen dexamethasone (DECADRON) 4 MG tablet Take 5 tabs at 10 pm the night before chemo and 5 tabs at 6 am the day of chemo, with food, by mouth 30 tablet 1  . lidocaine-prilocaine (EMLA) cream Apply to Porta-Cath 1-2 hours prior to access as directed. 30 g 1  . loratadine (CLARITIN) 10 MG tablet Take 10 mg by mouth daily as needed (Recommended for chemo pain). Reported on 03/16/2016    . mirtazapine (REMERON) 15 MG tablet Take 15 mg by mouth at bedtime.    Marland Kitchen omega-3 acid ethyl esters (LOVAZA) 1 g capsule Take 1 g by mouth daily.    . ondansetron (ZOFRAN) 8 MG tablet Take 1 every 8 hours/as needed for nausea, start after 3rd day of chemo 30 tablet 1  . PARoxetine (PAXIL) 20 MG tablet TAKE 1 TABLET BY MOUTH DAILY (Patient taking differently: TAKE 20 MG BY MOUTH DAILY) 90 tablet 0  . prochlorperazine (COMPAZINE) 10 MG tablet Take 1 tablet (10 mg total) by mouth every 6 (six) hours as needed (Nausea or vomiting). 30 tablet 1  . verapamil (CALAN-SR) 240 MG CR tablet Take 1 tablet (240 mg total) by mouth daily. 30 tablet 0   No current facility-administered medications for this visit.     PHYSICAL EXAMINATION: ECOG PERFORMANCE STATUS: 1 - Symptomatic but completely ambulatory  Vitals:   07/21/17 0905   BP: (!) 158/81  Pulse: (!) 101  Resp: 18  Temp: 97.6 F (36.4 C)  SpO2: 96%   Filed Weights   07/21/17 0905  Weight: 217 lb 12.8 oz (98.8 kg)    GENERAL:alert, no distress and comfortable SKIN: skin color, texture, turgor are normal, no rashes or significant lesions EYES: normal, Conjunctiva are pink and non-injected, sclera clear OROPHARYNX:no exudate, no erythema and lips, buccal mucosa, and tongue normal  NECK: supple, thyroid normal size, non-tender, without nodularity LYMPH:  no palpable lymphadenopathy in the cervical, axillary or inguinal LUNGS: clear to auscultation and percussion with normal breathing effort HEART: regular rate & rhythm and no murmurs and no lower extremity edema ABDOMEN:abdomen soft, non-tender and normal bowel sounds Musculoskeletal:no cyanosis of digits and no clubbing  NEURO: alert & oriented x 3 with mild word finding difficulties and dysarthria, right-sided weakness LABORATORY DATA:  I have reviewed the data as listed    Component Value Date/Time   NA 138 07/21/2017 0838   K 3.8 07/21/2017 7412  CL 105 06/14/2017 1430   CO2 21 (L) 07/21/2017 0838   GLUCOSE 260 (H) 07/21/2017 0838   BUN 16.7 07/21/2017 0838   CREATININE 1.0 07/21/2017 0838   CALCIUM 10.0 07/21/2017 0838   PROT 8.1 07/21/2017 0838   ALBUMIN 3.4 (L) 07/21/2017 0838   AST 31 07/21/2017 0838   ALT 40 07/21/2017 0838   ALKPHOS 151 (H) 07/21/2017 0838   BILITOT 0.55 07/21/2017 0838   GFRNONAA 68 06/14/2017 1430   GFRAA 79 06/14/2017 1430    No results found for: SPEP, UPEP  Lab Results  Component Value Date   WBC 5.2 07/21/2017   NEUTROABS 4.0 07/21/2017   HGB 13.4 07/21/2017   HCT 40.5 07/21/2017   MCV 104.3 (H) 07/21/2017   PLT 223 07/21/2017      Chemistry      Component Value Date/Time   NA 138 07/21/2017 0838   K 3.8 07/21/2017 0838   CL 105 06/14/2017 1430   CO2 21 (L) 07/21/2017 0838   BUN 16.7 07/21/2017 0838   CREATININE 1.0 07/21/2017 0838       Component Value Date/Time   CALCIUM 10.0 07/21/2017 0838   ALKPHOS 151 (H) 07/21/2017 0838   AST 31 07/21/2017 0838   ALT 40 07/21/2017 0838   BILITOT 0.55 07/21/2017 0838       RADIOGRAPHIC STUDIES: I have personally reviewed the radiological images as listed and agreed with the findings in the report. Ct Abdomen Pelvis W Contrast  Result Date: 06/24/2017 CLINICAL DATA:  Followup bilateral ovarian carcinoma. Status post chemotherapy. New elevation of CA 125 level. EXAM: CT ABDOMEN AND PELVIS WITH CONTRAST TECHNIQUE: Multidetector CT imaging of the abdomen and pelvis was performed using the standard protocol following bolus administration of intravenous contrast. CONTRAST:  16m ISOVUE-300 IOPAMIDOL (ISOVUE-300) INJECTION 61% COMPARISON:  09/30/2016 FINDINGS: Lower Chest: No acute findings. Hepatobiliary: Stable small benign hemangioma in caudate lobe measuring 1.9 cm . No other liver masses identified. Gallbladder is unremarkable. Pancreas:  No mass or inflammatory changes. Spleen: Within normal limits in size and appearance. Adrenals/Urinary Tract: No masses identified. No evidence of hydronephrosis. Stable bilateral renal parenchymal scarring. Unremarkable unopacified urinary bladder. Stomach/Bowel: No evidence of bowel obstruction . 2 small masses are seen along the serosal surface of small bowel loops in right pelvis which are new since previous study. These measure 2.1 cm on image 60/2 and 2.1 cm on image 68/2. Vascular/Lymphatic: Ill-defined soft tissue mass is seen along the right pelvic sidewall which involves adjacent loop of small bowel. This measures 3.3 x 4.7 cm on image 62/2, and is new since previous study. No abdominal aortic aneurysm. Reproductive: Prior hysterectomy. Cystic and solid mass is seen involving the left vaginal cuff measuring 3.1 x 2.7 cm on image 67/2, compared to 2.4 x 2.2 cm previously. No evidence of ascites. Other: Multiple peritoneal soft tissue nodules are seen  within the lower abdominal mesentery and right anterior paracolic gutter, which are new or increased in size. Index nodule in right lower quadrant mesenteric measures 1.4 cm on image 52/2 compared to 0.9 cm previously. Index nodule in the right anterior cerebellar gutter measures 1.4 cm on image 62/2, new since prior study. Musculoskeletal:  No suspicious bone lesions identified. IMPRESSION: Increased peritoneal metastatic disease in abdomen and pelvis since prior exam. No evidence of ascites. Stable small benign hepatic hemangioma. Electronically Signed   By: JEarle GellM.D.   On: 06/24/2017 14:43    ASSESSMENT & PLAN:  [place]varian cancer [place],  the patient has recurrence of disease Previously, she had complete response with carboplatin and Taxol I would favor we repeat similar combination chemotherapy but to give it every [redacted] weeks along with Neulasta support. I would not favor giving her doxorubicin in view of enlarged heart noted on imaging study and chronic A. fib fibrillation culminating into stroke and persistent right hemiplegia After 3 cycles of treatment, I will repeat imaging study to assess response to treatment If she have good response to treatment, we will consider maintenance treatment with PARP inhibitor I will order tumor marker CA-125 every 4 weeks to assess as this tumor marker has been helpful in the past The patient is reminded to take premedication before each cycle of treatment. I will see her back prior to cycle 2 of therapy for assessment of toxicity.  Right hemiparesis ([place]) The patient has persistent right-sided weakness She is able to function reasonably well with assistance at home  A-fib [facility]) She has chronic atrial fibrillation, rate control She is on chronic anticoagulation therapy The patient denies any recent signs or symptoms of bleeding such as spontaneous epistaxis, hematuria or hematochezia.   Goals of care, counseling/discussion The patient  is aware she has incurable disease and treatment is strictly palliative. We discussed importance of Advanced Directives and Living will. We discussed CODE STATUS; the patient desire full code   No orders of the defined types were placed in this encounter.  All questions were answered. The patient knows to call the clinic with any problems, questions or concerns. No barriers to learning was detected. I spent 20 minutes counseling the patient face to face. The total time spent in the appointment was 30 minutes and more than 50% was on counseling and review of test results     Heath Lark, MD 07/22/2017 4:04 PM

## 2017-07-30 ENCOUNTER — Telehealth: Payer: Self-pay | Admitting: Medical Oncology

## 2017-07-30 NOTE — Telephone Encounter (Signed)
[facility] requests hospital bed. Pt has right hemiparesis.She does get oob and walk with cane with 2 man assist. He states her current bed in too high and if she tried to get up and fall it is higher than a hospital bed-this bed would be closer to ground and rails would be more supportive for her when she rolls. I told him I will check with Alvy Bimler and it may not be until Monday before I have an answer.

## 2017-08-02 ENCOUNTER — Other Ambulatory Visit: Payer: Self-pay | Admitting: Hematology and Oncology

## 2017-08-02 ENCOUNTER — Telehealth: Payer: Self-pay | Admitting: *Deleted

## 2017-08-02 NOTE — Telephone Encounter (Signed)
RN spoke with [facility] regarding hospital bed. States they will need documentation that reflects need for frequent repositioning, or HOB elevation needed that cannot be achieved by wedges or pillows. Will not be reimbursable if just needed because current bed is too high. Can pay out of pocket $ 120/ month if desired. Explained to daughter- they will purchase a lower bed frame.

## 2017-08-02 NOTE — Telephone Encounter (Signed)
It's not clear whether this would be delivered via [facility] or she has to purchase separately through medicare. I will check with Tammi

## 2017-08-11 ENCOUNTER — Telehealth: Payer: Self-pay | Admitting: Hematology and Oncology

## 2017-08-11 ENCOUNTER — Ambulatory Visit: Payer: Medicare Other

## 2017-08-11 ENCOUNTER — Ambulatory Visit (HOSPITAL_BASED_OUTPATIENT_CLINIC_OR_DEPARTMENT_OTHER): Payer: Medicare Other | Admitting: Hematology and Oncology

## 2017-08-11 ENCOUNTER — Other Ambulatory Visit (HOSPITAL_BASED_OUTPATIENT_CLINIC_OR_DEPARTMENT_OTHER): Payer: Medicare Other

## 2017-08-11 ENCOUNTER — Ambulatory Visit (HOSPITAL_BASED_OUTPATIENT_CLINIC_OR_DEPARTMENT_OTHER): Payer: Medicare Other

## 2017-08-11 ENCOUNTER — Encounter: Payer: Self-pay | Admitting: Hematology and Oncology

## 2017-08-11 DIAGNOSIS — C800 Disseminated malignant neoplasm, unspecified: Secondary | ICD-10-CM

## 2017-08-11 DIAGNOSIS — Z7901 Long term (current) use of anticoagulants: Secondary | ICD-10-CM

## 2017-08-11 DIAGNOSIS — C561 Malignant neoplasm of right ovary: Secondary | ICD-10-CM

## 2017-08-11 DIAGNOSIS — Z5189 Encounter for other specified aftercare: Secondary | ICD-10-CM

## 2017-08-11 DIAGNOSIS — C562 Malignant neoplasm of left ovary: Secondary | ICD-10-CM

## 2017-08-11 DIAGNOSIS — G620 Drug-induced polyneuropathy: Secondary | ICD-10-CM | POA: Diagnosis not present

## 2017-08-11 DIAGNOSIS — I482 Chronic atrial fibrillation, unspecified: Secondary | ICD-10-CM

## 2017-08-11 DIAGNOSIS — C563 Malignant neoplasm of bilateral ovaries: Secondary | ICD-10-CM

## 2017-08-11 DIAGNOSIS — Z5111 Encounter for antineoplastic chemotherapy: Secondary | ICD-10-CM | POA: Diagnosis not present

## 2017-08-11 DIAGNOSIS — T451X5A Adverse effect of antineoplastic and immunosuppressive drugs, initial encounter: Secondary | ICD-10-CM

## 2017-08-11 DIAGNOSIS — Z95828 Presence of other vascular implants and grafts: Secondary | ICD-10-CM

## 2017-08-11 LAB — COMPREHENSIVE METABOLIC PANEL
ALT: 35 U/L (ref 0–55)
AST: 21 U/L (ref 5–34)
Albumin: 3.3 g/dL — ABNORMAL LOW (ref 3.5–5.0)
Alkaline Phosphatase: 143 U/L (ref 40–150)
Anion Gap: 13 mEq/L — ABNORMAL HIGH (ref 3–11)
BUN: 12.2 mg/dL (ref 7.0–26.0)
CO2: 21 mEq/L — ABNORMAL LOW (ref 22–29)
Calcium: 10.1 mg/dL (ref 8.4–10.4)
Chloride: 106 mEq/L (ref 98–109)
Creatinine: 1 mg/dL (ref 0.6–1.1)
EGFR: 71 mL/min/{1.73_m2} — ABNORMAL LOW (ref >90)
Glucose: 267 mg/dl — ABNORMAL HIGH (ref 70–140)
Potassium: 3.7 mEq/L (ref 3.5–5.1)
Sodium: 140 mEq/L (ref 136–145)
Total Bilirubin: 0.49 mg/dL (ref 0.20–1.20)
Total Protein: 7.5 g/dL (ref 6.4–8.3)

## 2017-08-11 LAB — CBC WITH DIFFERENTIAL/PLATELET
BASO%: 0 % (ref 0.0–2.0)
Basophils Absolute: 0 10*3/uL (ref 0.0–0.1)
EOS%: 0 % (ref 0.0–7.0)
Eosinophils Absolute: 0 10*3/uL (ref 0.0–0.5)
HCT: 36 % (ref 34.8–46.6)
HGB: 11.8 g/dL (ref 11.6–15.9)
LYMPH%: 12.2 % — ABNORMAL LOW (ref 14.0–49.7)
MCH: 33 pg (ref 25.1–34.0)
MCHC: 32.8 g/dL (ref 31.5–36.0)
MCV: 100.6 fL (ref 79.5–101.0)
MONO#: 0 10*3/uL — ABNORMAL LOW (ref 0.1–0.9)
MONO%: 0.2 % (ref 0.0–14.0)
NEUT#: 8.2 10*3/uL — ABNORMAL HIGH (ref 1.5–6.5)
NEUT%: 87.6 % — ABNORMAL HIGH (ref 38.4–76.8)
Platelets: 494 10*3/uL — ABNORMAL HIGH (ref 145–400)
RBC: 3.58 10*6/uL — ABNORMAL LOW (ref 3.70–5.45)
RDW: 15 % — ABNORMAL HIGH (ref 11.2–14.5)
WBC: 9.4 10*3/uL (ref 3.9–10.3)
lymph#: 1.1 10*3/uL (ref 0.9–3.3)

## 2017-08-11 MED ORDER — SODIUM CHLORIDE 0.9% FLUSH
10.0000 mL | INTRAVENOUS | Status: DC | PRN
  Administered 2017-08-11: 10 mL
  Filled 2017-08-11: qty 10

## 2017-08-11 MED ORDER — SODIUM CHLORIDE 0.9 % IV SOLN
Freq: Once | INTRAVENOUS | Status: AC
Start: 2017-08-11 — End: 2017-08-11
  Administered 2017-08-11: 10:00:00 via INTRAVENOUS

## 2017-08-11 MED ORDER — FAMOTIDINE IN NACL 20-0.9 MG/50ML-% IV SOLN
20.0000 mg | Freq: Once | INTRAVENOUS | Status: AC
  Administered 2017-08-11: 20 mg via INTRAVENOUS

## 2017-08-11 MED ORDER — FOSAPREPITANT DIMEGLUMINE INJECTION 150 MG
Freq: Once | INTRAVENOUS | Status: AC
  Administered 2017-08-11: 11:00:00 via INTRAVENOUS
  Filled 2017-08-11: qty 5

## 2017-08-11 MED ORDER — DIPHENHYDRAMINE HCL 50 MG/ML IJ SOLN
INTRAMUSCULAR | Status: AC
  Filled 2017-08-11: qty 1

## 2017-08-11 MED ORDER — GABAPENTIN 300 MG PO CAPS: 300.0000 mg | ORAL_CAPSULE | Freq: Two times a day (BID) | ORAL | 1 refills | Status: DC

## 2017-08-11 MED ORDER — PALONOSETRON HCL INJECTION 0.25 MG/5ML
INTRAVENOUS | Status: AC
  Filled 2017-08-11: qty 5

## 2017-08-11 MED ORDER — OXYCODONE HCL 5 MG PO TABS: 5.0000 mg | ORAL_TABLET | ORAL | 0 refills | Status: DC | PRN

## 2017-08-11 MED ORDER — FAMOTIDINE IN NACL 20-0.9 MG/50ML-% IV SOLN
INTRAVENOUS | Status: AC
  Filled 2017-08-11: qty 50

## 2017-08-11 MED ORDER — SODIUM CHLORIDE 0.9 % IV SOLN
140.0000 mg/m2 | Freq: Once | INTRAVENOUS | Status: AC
  Administered 2017-08-11: 294 mg via INTRAVENOUS
  Filled 2017-08-11: qty 49

## 2017-08-11 MED ORDER — DIPHENHYDRAMINE HCL 50 MG/ML IJ SOLN
50.0000 mg | Freq: Once | INTRAMUSCULAR | Status: AC
  Administered 2017-08-11: 50 mg via INTRAVENOUS

## 2017-08-11 MED ORDER — SODIUM CHLORIDE 0.9 % IV SOLN
600.0000 mg | Freq: Once | INTRAVENOUS | Status: AC
  Administered 2017-08-11: 600 mg via INTRAVENOUS
  Filled 2017-08-11: qty 60

## 2017-08-11 MED ORDER — PEGFILGRASTIM 6 MG/0.6ML ~~LOC~~ PSKT
6.0000 mg | PREFILLED_SYRINGE | Freq: Once | SUBCUTANEOUS | Status: AC
  Administered 2017-08-11: 6 mg via SUBCUTANEOUS
  Filled 2017-08-11: qty 0.6

## 2017-08-11 MED ORDER — CARBOPLATIN CHEMO INTRADERMAL TEST DOSE 100MCG/0.02ML
100.0000 ug | Freq: Once | INTRADERMAL | Status: AC
  Administered 2017-08-11: 100 ug via INTRADERMAL
  Filled 2017-08-11: qty 0.02

## 2017-08-11 MED ORDER — SODIUM CHLORIDE 0.9% FLUSH
10.0000 mL | Freq: Once | INTRAVENOUS | Status: AC
  Administered 2017-08-11: 10 mL
  Filled 2017-08-11: qty 10

## 2017-08-11 MED ORDER — PALONOSETRON HCL INJECTION 0.25 MG/5ML
0.2500 mg | Freq: Once | INTRAVENOUS | Status: AC
  Administered 2017-08-11: 0.25 mg via INTRAVENOUS

## 2017-08-11 MED ORDER — HEPARIN SOD (PORK) LOCK FLUSH 100 UNIT/ML IV SOLN
500.0000 [IU] | Freq: Once | INTRAVENOUS | Status: AC | PRN
  Administered 2017-08-11: 500 [IU]
  Filled 2017-08-11: qty 5

## 2017-08-11 NOTE — Progress Notes (Signed)
[facility] OFFICE PROGRESS NOTE  Patient Care Team: Helane Rima, MD as PCP - General (Family Medicine) Encarnacion Slates, MD as Referring Physician (Neurology)  SUMMARY OF ONCOLOGIC HISTORY: Oncology History   Negative genetic testing     Ovarian cancer [facility])   11/28/2015 Imaging    Ct scan of abdomen: Peritoneal carcinomatosis and pelvic/inguinal adenopathy. Gynecologic primary is favored.      12/17/2015 Pathology Results    Lymph node, needle/core biopsy, L inguinal LAN - METASTATIC ADENOCARCINOMA. Microscopic Comment Immunohistochemistry will be performed and reported as an addendum. (JDP:ecj 12/18/2015) ADDENDUM: Immunohistochemistry shows strong positivity with cytokeratin 7, estrogen receptor (ER), progesterone receptor (PR), p53 and WT1. Negative markers are cytokeratin 20 , CDX- 2 and gross cystic disease fluid protein. 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 (JDP:kh 12-19-15      12/17/2015 Procedure    Technically successful ultrasound guided core left inguinal adenopathy biopsy      12/24/2015 Tumor Marker    Patient's tumor was tested for the following markers: CA-125 Results of the tumor marker test revealed 608.2      12/27/2015 - 02/28/2016 Chemotherapy    She received neoadjuvant chemo x 3 cycles      01/01/2016 Tumor Marker    Patient's tumor was tested for the following markers: CA-125 Results of the tumor marker test revealed 741.6      01/29/2016 Procedure    Successful placement of a right internal jugular approach power injectable Port-A-Cath. The catheter is ready for immediate use      01/29/2016 Imaging    US abdomen 1. Hyperechoic 2.5 x 1.0 x 1.6 cm lesion in the region the caudate lobe of the liver. Similar finding noted on prior CT of 11/28/2015. This could represent hemangioma. This could represent a malignancy including metastatic  disease. 2. Exam otherwise unremarkable. No gallstones. No biliary distention.      02/03/2016 Tumor Marker    Patient's tumor was tested for the following markers: CA-125 Results of the tumor marker test revealed 137.1      02/20/2016 Imaging    MRI brain No acute infarct.  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  No intracranial mass.  Moderate small vessel disease changes.  Global atrophy without hydrocephalus.  Expanded partially empty sella without secondary findings of pseudotumor cerebri.  Minimal mucosal thickening ethmoid sinus air cells. Small air-fluid levels maxillary sinuses bilaterally may indicate changes of acute sinusitis.       02/28/2016 Tumor Marker    Patient's tumor was tested for the following markers: CA-125 Results of the tumor marker test revealed 33.6      03/02/2016 Imaging    CT chest, abdomen and pelvis 1. Today's study demonstrates a positive response to therapy with resolution of the previously noted malignant ascites, significant regression of previously noted peritoneal implants, and regression of previously noted lymphadenopathy in the abdomen and pelvis. 2. No definite evidence to suggest metastatic disease to the thorax. 3. Stable 1.0 x 1.7 cm intermediate attenuation lesion associated with the posterior aspect of segment 1 of the liver, favored to represent a mildly proteinaceous hepatic cyst. The possibility of a peritoneal implant in this region is not entirely excluded, but is not favored on today's examination. 4. Extensive post infectious scarring inguinal upper right lung, likely sequela of prior necrotizing pneumonia. 5. Multiple tiny pulmonary nodules scattered throughout the lungs bilaterally all measuring 4 mm or  less. These are nonspecific, but favored to be benign. Given the patient's history of primary gynecologic malignancy, attention on followup studies is recommended to ensure the stability of these  findings. 6. Additional incidental findings, as above      03/14/2016 Imaging    CT angiogram 1. No pulmonary emboli. 2. Chronic changes to the right upper lobe. 3. Stable lesion in the caudate lobe of the liver. 4. Stable soft tissue prominence to the right of the trachea as described above. 5. Stable small nodule in the right lung.       03/24/2016 Pathology Results    1. Omentum, resection for tumor - FOCAL ADENOCARCINOMA ASSOCIATED WITH EXTENSIVE FIBROSIS, INFLAMMATION AND HEMOSIDERIN DEPOSITION. 2. Uterus +/- tubes/ovaries, neoplastic, cervix - CERVIX: SLIGHT CERVICITIS AND SQUAMOUS METAPLASIA. - ENDOMETRIUM: ATROPHIC, NO HYPERPLASIA OR MALIGNANCY. - MYOMETRIUM: LEIOMYOMATA WITH DEGENERATIVE CHANGES. NO MALIGNANCY. - UTERINE SEROSA: FOCAL ADENOCARCINOMA ASSOCIATED WITH ADHESIONS. - RIGHT OVARY: BENIGN CALCIFIED NODULE AND BENIGN SEROUS CYST. NO MALIGNANCY IDENTIFIED. - RIGHT FALLOPIAN TUBE: UNREMARKABLE. NO MALIGNANCY IDENTIFIED. - LEFT OVARY: FOCAL ADENOCARCINOMA ASSOCIATED WITH EXTENSIVE FIBROSIS. - LEFT FALLOPIAN TUBE: HYDROSALPINX. NO MALIGNANCY IDENTIFIED. 3. Lymph node, biopsy, right external iliac - METASTATIC ADENOCARCINOMA, 4. Soft tissue, biopsy, left para colic gutter - FOCAL ADENOCARCINOMA ASSOCIATED WITH FIBROSIS. Microscopic Comment 2. OVARY Specimen(s): Uterus with bilateral ovaries, omentum, right external iliac lymph node and left paracolic gutter. Procedure: (including lymph node sampling) Hysterectomy with bilateral salpingo-oophorectomy, omentectomy, lymph node biopsy and paracolic gutter biopsy. Primary tumor site (including laterality): Left ovary. Ovarian surface involvement: Yes Ovarian capsule intact without fragmentation: N/A Maximum tumor size (cm): 2 cm, 2 cm Histologic type: Serous carcinoma. Grade: 3 Peritoneal implants: (specify invasive or non-invasive): Left paracolic gutter positive for carcinoma Pelvic extension (list additional  structures on separate lines and if involved): Omentum, right paracolic gutter and uterine serosa. Lymph nodes: number examined 1 ; number positive 1 TNM code: ypT3a, ypN1b, ypMX FIGO Stage (based on pathologic findings, needs clinical correlation): IIIA2 Comments: The left ovary has a 2 cm nodule, which is composed primarily of fibrous tissue with small foci of residual adenocarcinoma. There is also microscopic involvement of the uterine serosa, the left paracolic gutter biopsy and the omentum. The left paracolic gutter and omental involvement is microscopic and both are associated with extensive fibrosis with focal hemosiderin deposition. The right external iliac node is completely replaced by metastatic adenocarcinoma with serous features. (JDP:ecj 03/30/2016)      03/24/2016 Surgery    Operation: Robotic-assisted laparoscopic total hysterectomy with bilateral salpingoophorectomy, omentectomy, radical tumor debulking  Surgeon: Donaciano Eva  Operative Findings:  : small nodules in omentum, no ascites. Omental nodule (2cm) adherent to lower anterior abdominal wall. 2cm left colonic gutter cystic nodule. 6cm right external iliac lymph node. Grossly normal ovaries and tubes. Fibroid uterus. No gross residual disease at completion of surgery representing R0 optimal/complete resection.       04/17/2016 Imaging    CT abdomen and pelvis 1. There is no evidence of acute inflammatory process within abdomen or pelvis. 2. Stable low-density lesion within caudate lobe of the liver. No new hepatic lesions are noted. 3. Again noted lobulated renal contour and multifocal renal cortical scarring. No hydronephrosis or hydroureter. 4. No significant mesenteric adenopathy. No retroperitoneal adenopathy. Stable minimal residual peritoneal thickening within pelvis. No new pelvic implants or pelvic ascites. 5. Status post hysterectomy.  Unremarkable urinary bladder. 6. Moderate stool within colon as  described above. No evidence of colitis.  04/24/2016 - 06/19/2016 Chemotherapy    She received 3 more cycles of chemo after surgery      05/14/2016 Tumor Marker    Patient's tumor was tested for the following markers: CA-125 Results of the tumor marker test revealed 22.5      06/04/2016 Tumor Marker    Patient's tumor was tested for the following markers: CA-125 Results of the tumor marker test revealed 13.7      07/07/2016 Genetic Testing    Patient has genetic testing done for breast/ovasrian cancer panel Results revealed patient has no actionable mutation      07/23/2016 Imaging    Ct abdomen and pelvis 1. Heterogeneously enhancing 4.9 x 3.2 x 4.4 cm mass along the right pelvic sidewall may represent locally recurrent disease or malignant lymphadenopathy. 2. No other signs of definite metastatic disease noted elsewhere in the abdomen or pelvis. 3. Stable low-attenuation hepatic lesion in the caudate lobe of the liver, which appears to demonstrates some progressive centripetal filling on delayed images. This lesion is presumably benign given its stability compared to prior studies, and is favored to represent a small cavernous hemangioma. 4. Cardiomegaly with biatrial dilatation, which is very severe on the right side. 5. Aortic atherosclerosis. 6. Additional incidental findings, as above.      07/23/2016 Tumor Marker    Patient's tumor was tested for the following markers: CA-125 Results of the tumor marker test revealed 12.3      09/30/2016 Imaging    Ct abdomen and pelvis: 1. 4.9 cm heterogeneously enhancing mass seen along the right pelvic sidewall previously has almost completely resolved. There is some ill-defined soft tissue attenuation/peritoneal thickening in this region today but no discrete measurable lesion is evident. 2. No new or progressive findings on today's exam. 3. Stable hypo attenuating lesion in the caudate lobe of the liver. 4. Subtle aortic  atherosclerosis      09/30/2016 Tumor Marker    Patient's tumor was tested for the following markers: CA-125 Results of the tumor marker test revealed 9.6      10/05/2016 Pathology Results    Vagina, biopsy, left cuff - BENIGN FIBROEPITHELIAL (STROMAL) POLYP. - NO DYSPLASIA, ATYPIA OR MALIGNANCY IDENTIFIED.      01/14/2017 Tumor Marker    Patient's tumor was tested for the following markers: CA-125 Results of the tumor marker test revealed 11.9      05/05/2017 Tumor Marker    Patient's tumor was tested for the following markers: CA-125 Results of the tumor marker test revealed 28      06/14/2017 Tumor Marker    Patient's tumor was tested for the following markers: CA-125 Results of the tumor marker test revealed 45.7      06/24/2017 Imaging    Ct abdomen and pelvis: Increased peritoneal metastatic disease in abdomen and pelvis since prior exam. No evidence of ascites. Stable small benign hepatic hemangioma.      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 Does of chemo is reduced starting cycle 2 onwards       INTERVAL HISTORY: Please see below for problem oriented charting. She is seen today with her daughter prior to cycle 2 of chemotherapy She had severe exacerbation of peripheral  neuropathy and right lower extremity pain after cycle 1 of treatment The pain was so severe it was uncontrolled She did not take any pain medicine The pain was interfering with her sleep and has cause significant weakness and falls She did not sustain major injuries She denies mouth sores, nausea or vomiting No recent fever or chills. The patient denies any recent signs or symptoms of bleeding such as spontaneous epistaxis, hematuria or  hematochezia.  REVIEW OF SYSTEMS:   Constitutional: Denies fevers, chills or abnormal weight loss Eyes: Denies blurriness of vision Ears, nose, mouth, throat, and face: Denies mucositis or sore throat Respiratory: Denies cough, dyspnea or wheezes Cardiovascular: Denies palpitation, chest discomfort or lower extremity swelling Gastrointestinal:  Denies nausea, heartburn or change in bowel habits Skin: Denies abnormal skin rashes Lymphatics: Denies new lymphadenopathy or easy bruising Neurological:Denies numbness, tingling or new weaknesses Behavioral/Psych: Mood is stable, no new changes  All other systems were reviewed with the patient and are negative.  I have reviewed the past medical history, past surgical history, social history and family history with the patient and they are unchanged from previous note.  ALLERGIES:  is allergic to codeine and latex.  MEDICATIONS:  Current Outpatient Prescriptions  Medication Sig Dispense Refill  . apixaban (ELIQUIS) 5 MG TABS tablet Take 1 tablet (5 mg total) by mouth 2 (two) times daily. 60 tablet 5  . aspirin EC 81 MG tablet Take 81 mg by mouth daily.    Marland Kitchen atorvastatin (LIPITOR) 40 MG tablet Take 40 mg by mouth at bedtime.    Marland Kitchen dexamethasone (DECADRON) 4 MG tablet Take 5 tabs at 10 pm the night before chemo and 5 tabs at 6 am the day of chemo, with food, by mouth 30 tablet 1  . gabapentin (NEURONTIN) 300 MG capsule Take 1 capsule (300 mg total) by mouth 2 (two) times daily. 60 capsule 1  . lidocaine-prilocaine (EMLA) cream Apply to Porta-Cath 1-2 hours prior to access as directed. 30 g 1  . loratadine (CLARITIN) 10 MG tablet Take 10 mg by mouth daily as needed (Recommended for chemo pain). Reported on 03/16/2016    . mirtazapine (REMERON) 15 MG tablet Take 15 mg by mouth at bedtime.    Marland Kitchen omega-3 acid ethyl esters (LOVAZA) 1 g capsule Take 1 g by mouth daily.    . ondansetron (ZOFRAN) 8 MG tablet Take 1 every 8 hours/as needed for nausea, start  after 3rd day of chemo 30 tablet 1  . oxyCODONE (OXY IR/ROXICODONE) 5 MG immediate release tablet Take 1 tablet (5 mg total) by mouth every 4 (four) hours as needed for severe pain. 60 tablet 0  . PARoxetine (PAXIL) 20 MG tablet TAKE 1 TABLET BY MOUTH DAILY (Patient taking differently: TAKE 20 MG BY MOUTH DAILY) 90 tablet 0  . prochlorperazine (COMPAZINE) 10 MG tablet Take 1 tablet (10 mg total) by mouth every 6 (six) hours as needed (Nausea or vomiting). 30 tablet 1  . verapamil (CALAN-SR) 240 MG CR tablet Take 1 tablet (240 mg total) by mouth daily. 30 tablet 0   No current facility-administered medications for this visit.    Facility-Administered Medications Ordered in Other Visits  Medication Dose Route Frequency Provider Last Rate Last Dose  . CARBOplatin (PARAPLATIN) 600 mg in sodium chloride 0.9 % 250 mL chemo infusion  600 mg Intravenous Once Marji Kuehnel, MD      . heparin lock flush 100 unit/mL  500 Units Intracatheter Once PRN Heath Lark, MD      .  PACLitaxel (TAXOL) 294 mg in sodium chloride 0.9 % 250 mL chemo infusion (> '80mg'$ /m2)  140 mg/m2 (Treatment Plan Recorded) Intravenous Once Alvy Bimler, Chere Babson, MD 100 mL/hr at 08/11/17 1206 294 mg at 08/11/17 1206  . pegfilgrastim (NEULASTA ONPRO KIT) injection 6 mg  6 mg Subcutaneous Once [place] Monds, MD      . sodium chloride flush (NS) 0.9 % injection 10 mL  10 mL Intracatheter PRN Alvy Bimler, Juvencio Verdi, MD        PHYSICAL EXAMINATION: ECOG PERFORMANCE STATUS: 2 - Symptomatic, <50% confined to bed  Vitals:   08/11/17 0858  BP: 121/68  Pulse: 98  Resp: 18  Temp: 98.8 F (37.1 C)  SpO2: 99%   Filed Weights   08/11/17 0858  Weight: 216 lb 6.4 oz (98.2 kg)    GENERAL:alert, no distress and comfortable SKIN: skin color, texture, turgor are normal, no rashes or significant lesions EYES: normal, Conjunctiva are pink and non-injected, sclera clear OROPHARYNX:no exudate, no erythema and lips, buccal mucosa, and tongue normal  NECK: supple, thyroid  normal size, non-tender, without nodularity LYMPH:  no palpable lymphadenopathy in the cervical, axillary or inguinal LUNGS: clear to auscultation and percussion with normal breathing effort HEART: regular rate & rhythm and no murmurs and no lower extremity edema ABDOMEN:abdomen soft, non-tender and normal bowel sounds Musculoskeletal:no cyanosis of digits and no clubbing  NEURO: alert & oriented x 3 with mild dysarthria, left-sided weakness  LABORATORY DATA:  I have reviewed the data as listed    Component Value Date/Time   NA 140 08/11/2017 0831   K 3.7 08/11/2017 0831   CL 105 06/14/2017 1430   CO2 21 (L) 08/11/2017 0831   GLUCOSE 267 (H) 08/11/2017 0831   BUN 12.2 08/11/2017 0831   CREATININE 1.0 08/11/2017 0831   CALCIUM 10.1 08/11/2017 0831   PROT 7.5 08/11/2017 0831   ALBUMIN 3.3 (L) 08/11/2017 0831   AST 21 08/11/2017 0831   ALT 35 08/11/2017 0831   ALKPHOS 143 08/11/2017 0831   BILITOT 0.49 08/11/2017 0831   GFRNONAA 68 06/14/2017 1430   GFRAA 79 06/14/2017 1430    No results found for: SPEP, UPEP  Lab Results  Component Value Date   WBC 9.4 08/11/2017   NEUTROABS 8.2 (H) 08/11/2017   HGB 11.8 08/11/2017   HCT 36.0 08/11/2017   MCV 100.6 08/11/2017   PLT 494 (H) 08/11/2017      Chemistry      Component Value Date/Time   NA 140 08/11/2017 0831   K 3.7 08/11/2017 0831   CL 105 06/14/2017 1430   CO2 21 (L) 08/11/2017 0831   BUN 12.2 08/11/2017 0831   CREATININE 1.0 08/11/2017 0831      Component Value Date/Time   CALCIUM 10.1 08/11/2017 0831   ALKPHOS 143 08/11/2017 0831   AST 21 08/11/2017 0831   ALT 35 08/11/2017 0831   BILITOT 0.49 08/11/2017 0831       ASSESSMENT & PLAN:  Ovarian cancer ([place]) She tolerated treatment poorly with severe peripheral neuropathy I recommend dose adjustment for both carboplatin and Taxol We discussed the importance of getting her pain under control to move forward with future treatment I will see her back again in  the future prior to cycle 3 of therapy  Chronic atrial fibrillation ([place]) She has chronic atrial fibrillation, rate control She is on chronic anticoagulation therapy  Chemotherapy-induced peripheral neuropathy ([facility]) She had severe exacerbation of peripheral neuropathy from prior chemotherapy I recommend dose adjustment for both carboplatin  and Taxol I will start her on gabapentin We also discussed short-term narcotic prescription to get her pain under control I warned her about risk of sedation and constipation with treatment   No orders of the defined types were placed in this encounter.  All questions were answered. The patient knows to call the clinic with any problems, questions or concerns. No barriers to learning was detected. I spent 25 minutes counseling the patient face to face. The total time spent in the appointment was 30 minutes and more than 50% was on counseling and review of test results     Heath Lark, MD 08/11/2017 2:44 PM

## 2017-08-11 NOTE — Telephone Encounter (Signed)
Scheduled appt per 9/5 los - Gave patient AVS and calender per los.  

## 2017-08-11 NOTE — Assessment & Plan Note (Signed)
She has chronic atrial fibrillation, rate control She is on chronic anticoagulation therapy

## 2017-08-11 NOTE — Assessment & Plan Note (Signed)
She tolerated treatment poorly with severe peripheral neuropathy I recommend dose adjustment for both carboplatin and Taxol We discussed the importance of getting her pain under control to move forward with future treatment I will see her back again in the future prior to cycle 3 of therapy

## 2017-08-11 NOTE — Assessment & Plan Note (Signed)
She had severe exacerbation of peripheral neuropathy from prior chemotherapy I recommend dose adjustment for both carboplatin and Taxol I will start her on gabapentin We also discussed short-term narcotic prescription to get her pain under control I warned her about risk of sedation and constipation with treatment

## 2017-08-11 NOTE — Patient Instructions (Signed)
Implanted Port Home Guide An implanted port is a type of central line that is placed under the skin. Central lines are used to provide IV access when treatment or nutrition needs to be given through a person's veins. Implanted ports are used for long-term IV access. An implanted port may be placed because:  You need IV medicine that would be irritating to the small veins in your hands or arms.  You need long-term IV medicines, such as antibiotics.  You need IV nutrition for a long period.  You need frequent blood draws for lab tests.  You need dialysis.  Implanted ports are usually placed in the chest area, but they can also be placed in the upper arm, the abdomen, or the leg. An implanted port has two main parts:  Reservoir. The reservoir is round and will appear as a small, raised area under your skin. The reservoir is the part where a needle is inserted to give medicines or draw blood.  Catheter. The catheter is a thin, flexible tube that extends from the reservoir. The catheter is placed into a large vein. Medicine that is inserted into the reservoir goes into the catheter and then into the vein.  How will I care for my incision site? Do not get the incision site wet. Bathe or shower as directed by your health care provider. How is my port accessed? Special steps must be taken to access the port:  Before the port is accessed, a numbing cream can be placed on the skin. This helps numb the skin over the port site.  Your health care provider uses a sterile technique to access the port. ? Your health care provider must put on a mask and sterile gloves. ? The skin over your port is cleaned carefully with an antiseptic and allowed to dry. ? The port is gently pinched between sterile gloves, and a needle is inserted into the port.  Only "non-coring" port needles should be used to access the port. Once the port is accessed, a blood return should be checked. This helps ensure that the port  is in the vein and is not clogged.  If your port needs to remain accessed for a constant infusion, a clear (transparent) bandage will be placed over the needle site. The bandage and needle will need to be changed every week, or as directed by your health care provider.  Keep the bandage covering the needle clean and dry. Do not get it wet. Follow your health care provider's instructions on how to take a shower or bath while the port is accessed.  If your port does not need to stay accessed, no bandage is needed over the port.  What is flushing? Flushing helps keep the port from getting clogged. Follow your health care provider's instructions on how and when to flush the port. Ports are usually flushed with saline solution or a medicine called heparin. The need for flushing will depend on how the port is used.  If the port is used for intermittent medicines or blood draws, the port will need to be flushed: ? After medicines have been given. ? After blood has been drawn. ? As part of routine maintenance.  If a constant infusion is running, the port may not need to be flushed.  How long will my port stay implanted? The port can stay in for as long as your health care provider thinks it is needed. When it is time for the port to come out, surgery will be   done to remove it. The procedure is similar to the one performed when the port was put in. When should I seek immediate medical care? When you have an implanted port, you should seek immediate medical care if:  You notice a bad smell coming from the incision site.  You have swelling, redness, or drainage at the incision site.  You have more swelling or pain at the port site or the surrounding area.  You have a fever that is not controlled with medicine.  This information is not intended to replace advice given to you by your health care provider. Make sure you discuss any questions you have with your health care provider. Document  Released: 11/23/2005 Document Revised: 04/30/2016 Document Reviewed: 07/31/2013 Elsevier Interactive Patient Education  2017 [employer]  

## 2017-08-11 NOTE — Patient Instructions (Signed)
[facility] Discharge Instructions for Patients Receiving Chemotherapy  Today you received the following chemotherapy agents: Taxol and Carboplatin.  To help prevent nausea and vomiting after your treatment, we encourage you to take your nausea medication: Compazine 10 mg every 6 hours as needed; Zofran 8 mg every 8 hours as needed.   If you develop nausea and vomiting that is not controlled by your nausea medication, call the clinic.   BELOW ARE SYMPTOMS THAT SHOULD BE REPORTED IMMEDIATELY:  *FEVER GREATER THAN 100.5 F  *CHILLS WITH OR WITHOUT FEVER  NAUSEA AND VOMITING THAT IS NOT CONTROLLED WITH YOUR NAUSEA MEDICATION  *UNUSUAL SHORTNESS OF BREATH  *UNUSUAL BRUISING OR BLEEDING  TENDERNESS IN MOUTH AND THROAT WITH OR WITHOUT PRESENCE OF ULCERS  *URINARY PROBLEMS  *BOWEL PROBLEMS  UNUSUAL RASH Items with * indicate a potential emergency and should be followed up as soon as possible.  Feel free to call the clinic you have any questions or concerns. The clinic phone number is [phone].  Please show the [facility] at check-in to the Emergency Department and triage nurse.

## 2017-09-01 ENCOUNTER — Other Ambulatory Visit (HOSPITAL_BASED_OUTPATIENT_CLINIC_OR_DEPARTMENT_OTHER): Payer: Medicare Other

## 2017-09-01 ENCOUNTER — Ambulatory Visit (HOSPITAL_BASED_OUTPATIENT_CLINIC_OR_DEPARTMENT_OTHER): Payer: Medicare Other | Admitting: Hematology and Oncology

## 2017-09-01 ENCOUNTER — Ambulatory Visit (HOSPITAL_BASED_OUTPATIENT_CLINIC_OR_DEPARTMENT_OTHER): Payer: Medicare Other

## 2017-09-01 ENCOUNTER — Ambulatory Visit: Payer: Medicare Other

## 2017-09-01 ENCOUNTER — Telehealth: Payer: Self-pay | Admitting: Hematology and Oncology

## 2017-09-01 VITALS — BP 134/74 | HR 115 | Temp 98.0°F | Resp 20 | Ht 65.0 in | Wt 219.8 lb

## 2017-09-01 DIAGNOSIS — Z5111 Encounter for antineoplastic chemotherapy: Secondary | ICD-10-CM

## 2017-09-01 DIAGNOSIS — C563 Malignant neoplasm of bilateral ovaries: Secondary | ICD-10-CM

## 2017-09-01 DIAGNOSIS — I482 Chronic atrial fibrillation, unspecified: Secondary | ICD-10-CM

## 2017-09-01 DIAGNOSIS — T451X5A Adverse effect of antineoplastic and immunosuppressive drugs, initial encounter: Secondary | ICD-10-CM

## 2017-09-01 DIAGNOSIS — C561 Malignant neoplasm of right ovary: Secondary | ICD-10-CM

## 2017-09-01 DIAGNOSIS — C562 Malignant neoplasm of left ovary: Secondary | ICD-10-CM

## 2017-09-01 DIAGNOSIS — C800 Disseminated malignant neoplasm, unspecified: Secondary | ICD-10-CM

## 2017-09-01 DIAGNOSIS — R739 Hyperglycemia, unspecified: Secondary | ICD-10-CM | POA: Diagnosis not present

## 2017-09-01 DIAGNOSIS — T50905A Adverse effect of unspecified drugs, medicaments and biological substances, initial encounter: Secondary | ICD-10-CM

## 2017-09-01 DIAGNOSIS — Z23 Encounter for immunization: Secondary | ICD-10-CM | POA: Diagnosis not present

## 2017-09-01 DIAGNOSIS — G620 Drug-induced polyneuropathy: Secondary | ICD-10-CM

## 2017-09-01 DIAGNOSIS — Z95828 Presence of other vascular implants and grafts: Secondary | ICD-10-CM

## 2017-09-01 LAB — COMPREHENSIVE METABOLIC PANEL
ALT: 30 U/L (ref 0–55)
AST: 16 U/L (ref 5–34)
Albumin: 3.5 g/dL (ref 3.5–5.0)
Alkaline Phosphatase: 150 U/L (ref 40–150)
Anion Gap: 15 mEq/L — ABNORMAL HIGH (ref 3–11)
BUN: 14.8 mg/dL (ref 7.0–26.0)
CO2: 20 mEq/L — ABNORMAL LOW (ref 22–29)
Calcium: 9.9 mg/dL (ref 8.4–10.4)
Chloride: 107 mEq/L (ref 98–109)
Creatinine: 1 mg/dL (ref 0.6–1.1)
EGFR: 71 mL/min/{1.73_m2} — ABNORMAL LOW (ref >90)
Glucose: 272 mg/dl — ABNORMAL HIGH (ref 70–140)
Potassium: 3.5 mEq/L (ref 3.5–5.1)
Sodium: 142 mEq/L (ref 136–145)
Total Bilirubin: 0.64 mg/dL (ref 0.20–1.20)
Total Protein: 7.7 g/dL (ref 6.4–8.3)

## 2017-09-01 LAB — CBC WITH DIFFERENTIAL/PLATELET
BASO%: 0.2 % (ref 0.0–2.0)
Basophils Absolute: 0 10*3/uL (ref 0.0–0.1)
EOS%: 0 % (ref 0.0–7.0)
Eosinophils Absolute: 0 10*3/uL (ref 0.0–0.5)
HCT: 35.3 % (ref 34.8–46.6)
HGB: 11.8 g/dL (ref 11.6–15.9)
LYMPH%: 12.7 % — ABNORMAL LOW (ref 14.0–49.7)
MCH: 34.9 pg — ABNORMAL HIGH (ref 25.1–34.0)
MCHC: 33.4 g/dL (ref 31.5–36.0)
MCV: 104.3 fL — ABNORMAL HIGH (ref 79.5–101.0)
MONO#: 0 10*3/uL — ABNORMAL LOW (ref 0.1–0.9)
MONO%: 0.3 % (ref 0.0–14.0)
NEUT#: 6.4 10*3/uL (ref 1.5–6.5)
NEUT%: 86.8 % — ABNORMAL HIGH (ref 38.4–76.8)
Platelets: 292 10*3/uL (ref 145–400)
RBC: 3.38 10*6/uL — ABNORMAL LOW (ref 3.70–5.45)
RDW: 16.9 % — ABNORMAL HIGH (ref 11.2–14.5)
WBC: 7.4 10*3/uL (ref 3.9–10.3)
lymph#: 0.9 10*3/uL (ref 0.9–3.3)

## 2017-09-01 MED ORDER — FAMOTIDINE IN NACL 20-0.9 MG/50ML-% IV SOLN
20.0000 mg | Freq: Once | INTRAVENOUS | Status: AC
  Administered 2017-09-01: 20 mg via INTRAVENOUS

## 2017-09-01 MED ORDER — SODIUM CHLORIDE 0.9 % IV SOLN
Freq: Once | INTRAVENOUS | Status: AC
  Administered 2017-09-01: 12:00:00 via INTRAVENOUS
  Filled 2017-09-01: qty 5

## 2017-09-01 MED ORDER — INFLUENZA VAC SPLIT QUAD 0.5 ML IM SUSY
0.5000 mL | PREFILLED_SYRINGE | Freq: Once | INTRAMUSCULAR | Status: AC
  Administered 2017-09-01: 0.5 mL via INTRAMUSCULAR
  Filled 2017-09-01: qty 0.5

## 2017-09-01 MED ORDER — PALONOSETRON HCL INJECTION 0.25 MG/5ML
0.2500 mg | Freq: Once | INTRAVENOUS | Status: AC
  Administered 2017-09-01: 0.25 mg via INTRAVENOUS

## 2017-09-01 MED ORDER — DIPHENHYDRAMINE HCL 50 MG/ML IJ SOLN
50.0000 mg | Freq: Once | INTRAMUSCULAR | Status: AC
  Administered 2017-09-01: 50 mg via INTRAVENOUS

## 2017-09-01 MED ORDER — CARBOPLATIN CHEMO INTRADERMAL TEST DOSE 100MCG/0.02ML
100.0000 ug | Freq: Once | INTRADERMAL | Status: AC
  Administered 2017-09-01: 100 ug via INTRADERMAL
  Filled 2017-09-01: qty 0.02

## 2017-09-01 MED ORDER — SODIUM CHLORIDE 0.9 % IV SOLN
Freq: Once | INTRAVENOUS | Status: AC
  Administered 2017-09-01: 11:00:00 via INTRAVENOUS

## 2017-09-01 MED ORDER — SODIUM CHLORIDE 0.9% FLUSH
10.0000 mL | INTRAVENOUS | Status: DC | PRN
  Administered 2017-09-01: 10 mL via INTRAVENOUS
  Filled 2017-09-01: qty 10

## 2017-09-01 MED ORDER — HEPARIN SOD (PORK) LOCK FLUSH 100 UNIT/ML IV SOLN
500.0000 [IU] | Freq: Once | INTRAVENOUS | Status: AC
  Administered 2017-09-01: 500 [IU] via INTRAVENOUS
  Filled 2017-09-01: qty 5

## 2017-09-01 MED ORDER — SODIUM CHLORIDE 0.9% FLUSH
10.0000 mL | INTRAVENOUS | Status: DC | PRN
  Administered 2017-09-01: 10 mL
  Filled 2017-09-01: qty 10

## 2017-09-01 MED ORDER — SODIUM CHLORIDE 0.9 % IV SOLN
140.0000 mg/m2 | Freq: Once | INTRAVENOUS | Status: AC
  Administered 2017-09-01: 294 mg via INTRAVENOUS
  Filled 2017-09-01: qty 49

## 2017-09-01 MED ORDER — CARBOPLATIN CHEMO INJECTION 600 MG/60ML
600.0000 mg | Freq: Once | INTRAVENOUS | Status: AC
  Administered 2017-09-01: 600 mg via INTRAVENOUS
  Filled 2017-09-01: qty 60

## 2017-09-01 MED ORDER — HEPARIN SOD (PORK) LOCK FLUSH 100 UNIT/ML IV SOLN
500.0000 [IU] | Freq: Once | INTRAVENOUS | Status: AC | PRN
  Administered 2017-09-01: 500 [IU]
  Filled 2017-09-01: qty 5

## 2017-09-01 MED ORDER — FAMOTIDINE IN NACL 20-0.9 MG/50ML-% IV SOLN
INTRAVENOUS | Status: AC
  Filled 2017-09-01: qty 50

## 2017-09-01 MED ORDER — PEGFILGRASTIM 6 MG/0.6ML ~~LOC~~ PSKT
6.0000 mg | PREFILLED_SYRINGE | Freq: Once | SUBCUTANEOUS | Status: AC
  Administered 2017-09-01: 6 mg via SUBCUTANEOUS
  Filled 2017-09-01: qty 0.6

## 2017-09-01 MED ORDER — PALONOSETRON HCL INJECTION 0.25 MG/5ML
INTRAVENOUS | Status: AC
  Filled 2017-09-01: qty 5

## 2017-09-01 MED ORDER — DIPHENHYDRAMINE HCL 50 MG/ML IJ SOLN
INTRAMUSCULAR | Status: AC
  Filled 2017-09-01: qty 1

## 2017-09-01 NOTE — Telephone Encounter (Signed)
Scheduled appt per 9/26 los - Gave patient AVS and calender per los.  

## 2017-09-01 NOTE — Patient Instructions (Addendum)
May remove On-Pro @ 9pm on Thursday, 09/02/2017.  [facility] Discharge Instructions for Patients Receiving Chemotherapy  Today you received the following chemotherapy agents Taxol and Carboplatin  To help prevent nausea and vomiting after your treatment, we encourage you to take your nausea medication as directed.   If you develop nausea and vomiting that is not controlled by your nausea medication, call the clinic.   BELOW ARE SYMPTOMS THAT SHOULD BE REPORTED IMMEDIATELY:  *FEVER GREATER THAN 100.5 F  *CHILLS WITH OR WITHOUT FEVER  NAUSEA AND VOMITING THAT IS NOT CONTROLLED WITH YOUR NAUSEA MEDICATION  *UNUSUAL SHORTNESS OF BREATH  *UNUSUAL BRUISING OR BLEEDING  TENDERNESS IN MOUTH AND THROAT WITH OR WITHOUT PRESENCE OF ULCERS  *URINARY PROBLEMS  *BOWEL PROBLEMS  UNUSUAL RASH Items with * indicate a potential emergency and should be followed up as soon as possible.  Feel free to call the clinic should you have any questions or concerns. The clinic phone number is [phone].  Please show the [facility] at check-in to the Emergency Department and triage nurse.

## 2017-09-01 NOTE — Assessment & Plan Note (Signed)
She has elevated blood sugar due to steroid treatment She is not symptomatic Recommend increased fluid hydration as tolerated

## 2017-09-01 NOTE — Progress Notes (Signed)
[place] OFFICE PROGRESS NOTE  Patient Care Team: Helane Rima, MD as PCP - General (Family Medicine) Encarnacion Slates, MD as Referring Physician (Neurology)  SUMMARY OF ONCOLOGIC HISTORY: Oncology History   Negative genetic testing     Ovarian cancer [facility])   11/28/2015 Imaging    Ct scan of abdomen: Peritoneal carcinomatosis and pelvic/inguinal adenopathy. Gynecologic primary is favored.      12/17/2015 Pathology Results    Lymph node, needle/core biopsy, L inguinal LAN - METASTATIC ADENOCARCINOMA. Microscopic Comment Immunohistochemistry will be performed and reported as an addendum. (JDP:ecj 12/18/2015) ADDENDUM: Immunohistochemistry shows strong positivity with cytokeratin 7, estrogen receptor (ER), progesterone receptor (PR), p53 and WT1. Negative markers are cytokeratin 20 , CDX- 2 and gross cystic disease fluid protein. 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 (JDP:kh 12-19-15      12/17/2015 Procedure    Technically successful ultrasound guided core left inguinal adenopathy biopsy      12/24/2015 Tumor Marker    Patient's tumor was tested for the following markers: CA-125 Results of the tumor marker test revealed 608.2      12/27/2015 - 02/28/2016 Chemotherapy    She received neoadjuvant chemo x 3 cycles      01/01/2016 Tumor Marker    Patient's tumor was tested for the following markers: CA-125 Results of the tumor marker test revealed 741.6      01/29/2016 Procedure    Successful placement of a right internal jugular approach power injectable Port-A-Cath. The catheter is ready for immediate use      01/29/2016 Imaging    US abdomen 1. Hyperechoic 2.5 x 1.0 x 1.6 cm lesion in the region the caudate lobe of the liver. Similar finding noted on prior CT of 11/28/2015. This could represent hemangioma. This could represent a malignancy including metastatic  disease. 2. Exam otherwise unremarkable. No gallstones. No biliary distention.      02/03/2016 Tumor Marker    Patient's tumor was tested for the following markers: CA-125 Results of the tumor marker test revealed 137.1      02/20/2016 Imaging    MRI brain No acute infarct.  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  No intracranial mass.  Moderate small vessel disease changes.  Global atrophy without hydrocephalus.  Expanded partially empty sella without secondary findings of pseudotumor cerebri.  Minimal mucosal thickening ethmoid sinus air cells. Small air-fluid levels maxillary sinuses bilaterally may indicate changes of acute sinusitis.       02/28/2016 Tumor Marker    Patient's tumor was tested for the following markers: CA-125 Results of the tumor marker test revealed 33.6      03/02/2016 Imaging    CT chest, abdomen and pelvis 1. Today's study demonstrates a positive response to therapy with resolution of the previously noted malignant ascites, significant regression of previously noted peritoneal implants, and regression of previously noted lymphadenopathy in the abdomen and pelvis. 2. No definite evidence to suggest metastatic disease to the thorax. 3. Stable 1.0 x 1.7 cm intermediate attenuation lesion associated with the posterior aspect of segment 1 of the liver, favored to represent a mildly proteinaceous hepatic cyst. The possibility of a peritoneal implant in this region is not entirely excluded, but is not favored on today's examination. 4. Extensive post infectious scarring inguinal upper right lung, likely sequela of prior necrotizing pneumonia. 5. Multiple tiny pulmonary nodules scattered throughout the lungs bilaterally all measuring 4 mm or  less. These are nonspecific, but favored to be benign. Given the patient's history of primary gynecologic malignancy, attention on followup studies is recommended to ensure the stability of these  findings. 6. Additional incidental findings, as above      03/14/2016 Imaging    CT angiogram 1. No pulmonary emboli. 2. Chronic changes to the right upper lobe. 3. Stable lesion in the caudate lobe of the liver. 4. Stable soft tissue prominence to the right of the trachea as described above. 5. Stable small nodule in the right lung.       03/24/2016 Pathology Results    1. Omentum, resection for tumor - FOCAL ADENOCARCINOMA ASSOCIATED WITH EXTENSIVE FIBROSIS, INFLAMMATION AND HEMOSIDERIN DEPOSITION. 2. Uterus +/- tubes/ovaries, neoplastic, cervix - CERVIX: SLIGHT CERVICITIS AND SQUAMOUS METAPLASIA. - ENDOMETRIUM: ATROPHIC, NO HYPERPLASIA OR MALIGNANCY. - MYOMETRIUM: LEIOMYOMATA WITH DEGENERATIVE CHANGES. NO MALIGNANCY. - UTERINE SEROSA: FOCAL ADENOCARCINOMA ASSOCIATED WITH ADHESIONS. - RIGHT OVARY: BENIGN CALCIFIED NODULE AND BENIGN SEROUS CYST. NO MALIGNANCY IDENTIFIED. - RIGHT FALLOPIAN TUBE: UNREMARKABLE. NO MALIGNANCY IDENTIFIED. - LEFT OVARY: FOCAL ADENOCARCINOMA ASSOCIATED WITH EXTENSIVE FIBROSIS. - LEFT FALLOPIAN TUBE: HYDROSALPINX. NO MALIGNANCY IDENTIFIED. 3. Lymph node, biopsy, right external iliac - METASTATIC ADENOCARCINOMA, 4. Soft tissue, biopsy, left para colic gutter - FOCAL ADENOCARCINOMA ASSOCIATED WITH FIBROSIS. Microscopic Comment 2. OVARY Specimen(s): Uterus with bilateral ovaries, omentum, right external iliac lymph node and left paracolic gutter. Procedure: (including lymph node sampling) Hysterectomy with bilateral salpingo-oophorectomy, omentectomy, lymph node biopsy and paracolic gutter biopsy. Primary tumor site (including laterality): Left ovary. Ovarian surface involvement: Yes Ovarian capsule intact without fragmentation: N/A Maximum tumor size (cm): 2 cm, 2 cm Histologic type: Serous carcinoma. Grade: 3 Peritoneal implants: (specify invasive or non-invasive): Left paracolic gutter positive for carcinoma Pelvic extension (list additional  structures on separate lines and if involved): Omentum, right paracolic gutter and uterine serosa. Lymph nodes: number examined 1 ; number positive 1 TNM code: ypT3a, ypN1b, ypMX FIGO Stage (based on pathologic findings, needs clinical correlation): IIIA2 Comments: The left ovary has a 2 cm nodule, which is composed primarily of fibrous tissue with small foci of residual adenocarcinoma. There is also microscopic involvement of the uterine serosa, the left paracolic gutter biopsy and the omentum. The left paracolic gutter and omental involvement is microscopic and both are associated with extensive fibrosis with focal hemosiderin deposition. The right external iliac node is completely replaced by metastatic adenocarcinoma with serous features. (JDP:ecj 03/30/2016)      03/24/2016 Surgery    Operation: Robotic-assisted laparoscopic total hysterectomy with bilateral salpingoophorectomy, omentectomy, radical tumor debulking  Surgeon: Donaciano Eva  Operative Findings:  : small nodules in omentum, no ascites. Omental nodule (2cm) adherent to lower anterior abdominal wall. 2cm left colonic gutter cystic nodule. 6cm right external iliac lymph node. Grossly normal ovaries and tubes. Fibroid uterus. No gross residual disease at completion of surgery representing R0 optimal/complete resection.       04/17/2016 Imaging    CT abdomen and pelvis 1. There is no evidence of acute inflammatory process within abdomen or pelvis. 2. Stable low-density lesion within caudate lobe of the liver. No new hepatic lesions are noted. 3. Again noted lobulated renal contour and multifocal renal cortical scarring. No hydronephrosis or hydroureter. 4. No significant mesenteric adenopathy. No retroperitoneal adenopathy. Stable minimal residual peritoneal thickening within pelvis. No new pelvic implants or pelvic ascites. 5. Status post hysterectomy.  Unremarkable urinary bladder. 6. Moderate stool within colon as  described above. No evidence of colitis.  04/24/2016 - 06/19/2016 Chemotherapy    She received 3 more cycles of chemo after surgery      05/14/2016 Tumor Marker    Patient's tumor was tested for the following markers: CA-125 Results of the tumor marker test revealed 22.5      06/04/2016 Tumor Marker    Patient's tumor was tested for the following markers: CA-125 Results of the tumor marker test revealed 13.7      07/07/2016 Genetic Testing    Patient has genetic testing done for breast/ovasrian cancer panel Results revealed patient has no actionable mutation      07/23/2016 Imaging    Ct abdomen and pelvis 1. Heterogeneously enhancing 4.9 x 3.2 x 4.4 cm mass along the right pelvic sidewall may represent locally recurrent disease or malignant lymphadenopathy. 2. No other signs of definite metastatic disease noted elsewhere in the abdomen or pelvis. 3. Stable low-attenuation hepatic lesion in the caudate lobe of the liver, which appears to demonstrates some progressive centripetal filling on delayed images. This lesion is presumably benign given its stability compared to prior studies, and is favored to represent a small cavernous hemangioma. 4. Cardiomegaly with biatrial dilatation, which is very severe on the right side. 5. Aortic atherosclerosis. 6. Additional incidental findings, as above.      07/23/2016 Tumor Marker    Patient's tumor was tested for the following markers: CA-125 Results of the tumor marker test revealed 12.3      09/30/2016 Imaging    Ct abdomen and pelvis: 1. 4.9 cm heterogeneously enhancing mass seen along the right pelvic sidewall previously has almost completely resolved. There is some ill-defined soft tissue attenuation/peritoneal thickening in this region today but no discrete measurable lesion is evident. 2. No new or progressive findings on today's exam. 3. Stable hypo attenuating lesion in the caudate lobe of the liver. 4. Subtle aortic  atherosclerosis      09/30/2016 Tumor Marker    Patient's tumor was tested for the following markers: CA-125 Results of the tumor marker test revealed 9.6      10/05/2016 Pathology Results    Vagina, biopsy, left cuff - BENIGN FIBROEPITHELIAL (STROMAL) POLYP. - NO DYSPLASIA, ATYPIA OR MALIGNANCY IDENTIFIED.      01/14/2017 Tumor Marker    Patient's tumor was tested for the following markers: CA-125 Results of the tumor marker test revealed 11.9      05/05/2017 Tumor Marker    Patient's tumor was tested for the following markers: CA-125 Results of the tumor marker test revealed 28      06/14/2017 Tumor Marker    Patient's tumor was tested for the following markers: CA-125 Results of the tumor marker test revealed 45.7      06/24/2017 Imaging    Ct abdomen and pelvis: Increased peritoneal metastatic disease in abdomen and pelvis since prior exam. No evidence of ascites. Stable small benign hepatic hemangioma.      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 Does of chemo is reduced starting cycle 2 onwards       INTERVAL HISTORY: Please see below for problem oriented charting. She is seen prior to cycle 3 of treatment She tolerated last cycle better Denies significant peripheral neuropathy No  recent infection Denies changes in bowel habits No abnormal vaginal bleeding or bloating The patient denies any recent signs or symptoms of bleeding such as spontaneous epistaxis, hematuria or hematochezia.   REVIEW OF SYSTEMS:   Constitutional: Denies fevers, chills or abnormal weight loss Eyes: Denies blurriness of vision Ears, nose, mouth, throat, and face: Denies mucositis or sore throat Respiratory: Denies cough, dyspnea or  wheezes Cardiovascular: Denies palpitation, chest discomfort or lower extremity swelling Gastrointestinal:  Denies nausea, heartburn or change in bowel habits Skin: Denies abnormal skin rashes Lymphatics: Denies new lymphadenopathy or easy bruising Neurological:Denies numbness, tingling or new weaknesses Behavioral/Psych: Mood is stable, no new changes  All other systems were reviewed with the patient and are negative.  I have reviewed the past medical history, past surgical history, social history and family history with the patient and they are unchanged from previous note.  ALLERGIES:  is allergic to codeine and latex.  MEDICATIONS:  Current Outpatient Prescriptions  Medication Sig Dispense Refill  . apixaban (ELIQUIS) 5 MG TABS tablet Take 1 tablet (5 mg total) by mouth 2 (two) times daily. 60 tablet 5  . aspirin EC 81 MG tablet Take 81 mg by mouth daily.    Marland Kitchen atorvastatin (LIPITOR) 40 MG tablet Take 40 mg by mouth at bedtime.    Marland Kitchen dexamethasone (DECADRON) 4 MG tablet Take 5 tabs at 10 pm the night before chemo and 5 tabs at 6 am the day of chemo, with food, by mouth 30 tablet 1  . gabapentin (NEURONTIN) 300 MG capsule Take 1 capsule (300 mg total) by mouth 2 (two) times daily. 60 capsule 1  . lidocaine-prilocaine (EMLA) cream Apply to Porta-Cath 1-2 hours prior to access as directed. 30 g 1  . loratadine (CLARITIN) 10 MG tablet Take 10 mg by mouth daily as needed (Recommended for chemo pain). Reported on 03/16/2016    . mirtazapine (REMERON) 15 MG tablet Take 15 mg by mouth at bedtime.    Marland Kitchen omega-3 acid ethyl esters (LOVAZA) 1 g capsule Take 1 g by mouth daily.    . ondansetron (ZOFRAN) 8 MG tablet Take 1 every 8 hours/as needed for nausea, start after 3rd day of chemo 30 tablet 1  . oxyCODONE (OXY IR/ROXICODONE) 5 MG immediate release tablet Take 1 tablet (5 mg total) by mouth every 4 (four) hours as needed for severe pain. 60 tablet 0  . PARoxetine (PAXIL) 20 MG tablet TAKE 1 TABLET  BY MOUTH DAILY (Patient taking differently: TAKE 20 MG BY MOUTH DAILY) 90 tablet 0  . prochlorperazine (COMPAZINE) 10 MG tablet Take 1 tablet (10 mg total) by mouth every 6 (six) hours as needed (Nausea or vomiting). 30 tablet 1  . verapamil (CALAN-SR) 240 MG CR tablet Take 1 tablet (240 mg total) by mouth daily. 30 tablet 0   No current facility-administered medications for this visit.    Facility-Administered Medications Ordered in Other Visits  Medication Dose Route Frequency Provider Last Rate Last Dose  . CARBOplatin CHEMO intradermal Test Dose 100 mcg/0.52m  100 mcg Intradermal Once GAlvy Bimler Damonique Brunelle, MD      . Influenza vac split quadrivalent PF (FLUARIX) injection 0.5 mL  0.5 mL Intramuscular Once GAlvy Bimler Kaiana Marion, MD        PHYSICAL EXAMINATION: ECOG PERFORMANCE STATUS: 1 - Symptomatic but completely ambulatory  Vitals:   09/01/17 0910  BP: 134/74  Pulse: (!) 115  Resp: 20  Temp: 98 F (36.7 C)  SpO2: 98%   Filed Weights  09/01/17 0910  Weight: 219 lb 12.8 oz (99.7 kg)    GENERAL:alert, no distress and comfortable SKIN: skin color, texture, turgor are normal, no rashes or significant lesions EYES: normal, Conjunctiva are pink and non-injected, sclera clear OROPHARYNX:no exudate, no erythema and lips, buccal mucosa, and tongue normal  NECK: supple, thyroid normal size, non-tender, without nodularity LYMPH:  no palpable lymphadenopathy in the cervical, axillary or inguinal LUNGS: clear to auscultation and percussion with normal breathing effort HEART: regular rate & rhythm and no murmurs and no lower extremity edema ABDOMEN:abdomen soft, non-tender and normal bowel sounds Musculoskeletal:no cyanosis of digits and no clubbing  NEURO: alert & oriented x 3 with dysarthria and persistent hemiparesis  LABORATORY DATA:  I have reviewed the data as listed    Component Value Date/Time   NA 142 09/01/2017 0858   K 3.5 09/01/2017 0858   CL 105 06/14/2017 1430   CO2 20 (L)  09/01/2017 0858   GLUCOSE 272 (H) 09/01/2017 0858   BUN 14.8 09/01/2017 0858   CREATININE 1.0 09/01/2017 0858   CALCIUM 9.9 09/01/2017 0858   PROT 7.7 09/01/2017 0858   ALBUMIN 3.5 09/01/2017 0858   AST 16 09/01/2017 0858   ALT 30 09/01/2017 0858   ALKPHOS 150 09/01/2017 0858   BILITOT 0.64 09/01/2017 0858   GFRNONAA 68 06/14/2017 1430   GFRAA 79 06/14/2017 1430    No results found for: SPEP, UPEP  Lab Results  Component Value Date   WBC 7.4 09/01/2017   NEUTROABS 6.4 09/01/2017   HGB 11.8 09/01/2017   HCT 35.3 09/01/2017   MCV 104.3 (H) 09/01/2017   PLT 292 09/01/2017      Chemistry      Component Value Date/Time   NA 142 09/01/2017 0858   K 3.5 09/01/2017 0858   CL 105 06/14/2017 1430   CO2 20 (L) 09/01/2017 0858   BUN 14.8 09/01/2017 0858   CREATININE 1.0 09/01/2017 0858      Component Value Date/Time   CALCIUM 9.9 09/01/2017 0858   ALKPHOS 150 09/01/2017 0858   AST 16 09/01/2017 0858   ALT 30 09/01/2017 0858   BILITOT 0.64 09/01/2017 0858       ASSESSMENT & PLAN:  Ovarian cancer ([place]) She tolerated treatment cycle 1 poorly with severe peripheral neuropathy With cycle 2, I reduce a dose of both carboplatin and Taxol and she tolerated better I recommend we continue with dose adjustment for cycle 3 I plan to repeat imaging study after 3 cycles of treatment to assess response to treatment If she has complete response to treatment, I will give her a total of 4 cycles and switch her to PARP inhibitor If she only have partial response to treatment, I would plan to maximize up to 6 cycles of chemotherapy  Chemotherapy-induced peripheral neuropathy (HCC) She had severe exacerbation of peripheral neuropathy from prior chemotherapy I recommend dose adjustment for both carboplatin and Taxol She will continue gabapentin and prescription narcotics as needed  A-fib ([place]) She has chronic atrial fibrillation, rate control She is on chronic anticoagulation  therapy  Drug-induced hyperglycemia She has elevated blood sugar due to steroid treatment She is not symptomatic Recommend increased fluid hydration as tolerated   Orders Placed This Encounter  Procedures  . CT ABDOMEN PELVIS W CONTRAST    Standing Status:   Future    Standing Expiration Date:   09/01/2018    Order Specific Question:   If indicated for the ordered procedure, I authorize the administration of  contrast media per Radiology protocol    Answer:   Yes    Order Specific Question:   Preferred imaging location?    Answer:   [facility]    Order Specific Question:   Radiology Contrast Protocol - do NOT remove file path    Answer:   \\[email or web address]  . CT CHEST W CONTRAST    Standing Status:   Future    Standing Expiration Date:   09/01/2018    Order Specific Question:   If indicated for the ordered procedure, I authorize the administration of contrast media per Radiology protocol    Answer:   Yes    Order Specific Question:   Preferred imaging location?    Answer:   [facility]    Order Specific Question:   Radiology Contrast Protocol - do NOT remove file path    Answer:   \\[email or web address]   All questions were answered. The patient knows to call the clinic with any problems, questions or concerns. No barriers to learning was detected. I spent 15 minutes counseling the patient face to face. The total time spent in the appointment was 20 minutes and more than 50% was on counseling and review of test results     Heath Lark, MD 09/01/2017 10:19 AM

## 2017-09-01 NOTE — Assessment & Plan Note (Signed)
She has chronic atrial fibrillation, rate control She is on chronic anticoagulation therapy

## 2017-09-01 NOTE — Assessment & Plan Note (Signed)
She had severe exacerbation of peripheral neuropathy from prior chemotherapy I recommend dose adjustment for both carboplatin and Taxol She will continue gabapentin and prescription narcotics as needed

## 2017-09-01 NOTE — Assessment & Plan Note (Signed)
She tolerated treatment cycle 1 poorly with severe peripheral neuropathy With cycle 2, I reduce a dose of both carboplatin and Taxol and she tolerated better I recommend we continue with dose adjustment for cycle 3 I plan to repeat imaging study after 3 cycles of treatment to assess response to treatment If she has complete response to treatment, I will give her a total of 4 cycles and switch her to PARP inhibitor If she only have partial response to treatment, I would plan to maximize up to 6 cycles of chemotherapy

## 2017-09-03 LAB — CA 125: Cancer Antigen (CA) 125: 27.7 U/mL (ref 0.0–38.1)

## 2017-09-16 ENCOUNTER — Encounter (HOSPITAL_COMMUNITY): Payer: Self-pay

## 2017-09-16 ENCOUNTER — Ambulatory Visit (HOSPITAL_COMMUNITY)
Admission: RE | Admit: 2017-09-16 | Discharge: 2017-09-16 | Disposition: A | Discharge status: Home / Self Care | Payer: Medicare Other | Source: Ambulatory Visit | Attending: Hematology and Oncology | Admitting: Hematology and Oncology

## 2017-09-16 DIAGNOSIS — I517 Cardiomegaly: Secondary | ICD-10-CM | POA: Insufficient documentation

## 2017-09-16 DIAGNOSIS — C786 Secondary malignant neoplasm of retroperitoneum and peritoneum: Secondary | ICD-10-CM | POA: Diagnosis not present

## 2017-09-16 DIAGNOSIS — M160 Bilateral primary osteoarthritis of hip: Secondary | ICD-10-CM | POA: Diagnosis not present

## 2017-09-16 DIAGNOSIS — C561 Malignant neoplasm of right ovary: Secondary | ICD-10-CM | POA: Diagnosis not present

## 2017-09-16 DIAGNOSIS — C562 Malignant neoplasm of left ovary: Secondary | ICD-10-CM | POA: Diagnosis present

## 2017-09-16 DIAGNOSIS — I700 Atherosclerosis of aorta: Secondary | ICD-10-CM | POA: Diagnosis not present

## 2017-09-16 DIAGNOSIS — Q2546 Tortuous aortic arch: Secondary | ICD-10-CM | POA: Insufficient documentation

## 2017-09-16 DIAGNOSIS — M8580 Other specified disorders of bone density and structure, unspecified site: Secondary | ICD-10-CM | POA: Insufficient documentation

## 2017-09-16 DIAGNOSIS — J479 Bronchiectasis, uncomplicated: Secondary | ICD-10-CM | POA: Diagnosis not present

## 2017-09-16 DIAGNOSIS — C563 Malignant neoplasm of bilateral ovaries: Secondary | ICD-10-CM

## 2017-09-16 MED ORDER — IOPAMIDOL (ISOVUE-300) INJECTION 61%
INTRAVENOUS | Status: AC
  Administered 2017-09-16: 100 mL via INTRAVENOUS
  Filled 2017-09-16: qty 100

## 2017-09-16 MED ORDER — IOPAMIDOL (ISOVUE-300) INJECTION 61%
100.0000 mL | Freq: Once | INTRAVENOUS | Status: AC | PRN
  Administered 2017-09-16: 100 mL via INTRAVENOUS

## 2017-09-21 ENCOUNTER — Telehealth: Payer: Self-pay | Admitting: *Deleted

## 2017-09-21 ENCOUNTER — Other Ambulatory Visit: Payer: Self-pay | Admitting: *Deleted

## 2017-09-21 ENCOUNTER — Other Ambulatory Visit: Payer: Self-pay | Admitting: Hematology and Oncology

## 2017-09-21 DIAGNOSIS — C563 Malignant neoplasm of bilateral ovaries: Secondary | ICD-10-CM

## 2017-09-21 DIAGNOSIS — C800 Disseminated malignant neoplasm, unspecified: Secondary | ICD-10-CM

## 2017-09-21 DIAGNOSIS — C561 Malignant neoplasm of right ovary: Secondary | ICD-10-CM

## 2017-09-21 DIAGNOSIS — C562 Malignant neoplasm of left ovary: Secondary | ICD-10-CM

## 2017-09-21 MED ORDER — DEXAMETHASONE 4 MG PO TABS: ORAL_TABLET | ORAL | 1 refills | Status: DC

## 2017-09-21 NOTE — Telephone Encounter (Signed)
I renewed her dexamethasone electronically Yes for treatment as scheduled

## 2017-09-21 NOTE — Telephone Encounter (Signed)
Notified of message below

## 2017-09-21 NOTE — Telephone Encounter (Signed)
Daughter left message stating her mother does not have the 5 pills to take prior to chemo. Says she is not sure if Sundra will receive chemo tomorrow, so does she need the pills?

## 2017-09-22 ENCOUNTER — Other Ambulatory Visit (HOSPITAL_BASED_OUTPATIENT_CLINIC_OR_DEPARTMENT_OTHER): Payer: Medicare Other

## 2017-09-22 ENCOUNTER — Ambulatory Visit (HOSPITAL_BASED_OUTPATIENT_CLINIC_OR_DEPARTMENT_OTHER): Payer: Medicare Other

## 2017-09-22 ENCOUNTER — Ambulatory Visit (HOSPITAL_BASED_OUTPATIENT_CLINIC_OR_DEPARTMENT_OTHER): Payer: Medicare Other | Admitting: Hematology and Oncology

## 2017-09-22 ENCOUNTER — Encounter: Payer: Self-pay | Admitting: Hematology and Oncology

## 2017-09-22 ENCOUNTER — Ambulatory Visit: Payer: Medicare Other

## 2017-09-22 VITALS — BP 137/93 | HR 84 | Temp 98.4°F | Resp 18 | Ht 65.0 in | Wt 227.2 lb

## 2017-09-22 DIAGNOSIS — C561 Malignant neoplasm of right ovary: Secondary | ICD-10-CM

## 2017-09-22 DIAGNOSIS — C800 Disseminated malignant neoplasm, unspecified: Secondary | ICD-10-CM

## 2017-09-22 DIAGNOSIS — I482 Chronic atrial fibrillation, unspecified: Secondary | ICD-10-CM

## 2017-09-22 DIAGNOSIS — T50905A Adverse effect of unspecified drugs, medicaments and biological substances, initial encounter: Secondary | ICD-10-CM

## 2017-09-22 DIAGNOSIS — Z5111 Encounter for antineoplastic chemotherapy: Secondary | ICD-10-CM | POA: Diagnosis not present

## 2017-09-22 DIAGNOSIS — G620 Drug-induced polyneuropathy: Secondary | ICD-10-CM

## 2017-09-22 DIAGNOSIS — R739 Hyperglycemia, unspecified: Secondary | ICD-10-CM | POA: Diagnosis not present

## 2017-09-22 DIAGNOSIS — C562 Malignant neoplasm of left ovary: Secondary | ICD-10-CM

## 2017-09-22 DIAGNOSIS — C563 Malignant neoplasm of bilateral ovaries: Secondary | ICD-10-CM

## 2017-09-22 DIAGNOSIS — T451X5A Adverse effect of antineoplastic and immunosuppressive drugs, initial encounter: Secondary | ICD-10-CM

## 2017-09-22 LAB — CBC WITH DIFFERENTIAL/PLATELET
BASO%: 0 % (ref 0.0–2.0)
Basophils Absolute: 0 10*3/uL (ref 0.0–0.1)
EOS%: 0 % (ref 0.0–7.0)
Eosinophils Absolute: 0 10*3/uL (ref 0.0–0.5)
HCT: 32.5 % — ABNORMAL LOW (ref 34.8–46.6)
HGB: 10.7 g/dL — ABNORMAL LOW (ref 11.6–15.9)
LYMPH%: 11.8 % — ABNORMAL LOW (ref 14.0–49.7)
MCH: 35 pg — ABNORMAL HIGH (ref 25.1–34.0)
MCHC: 32.9 g/dL (ref 31.5–36.0)
MCV: 106.2 fL — ABNORMAL HIGH (ref 79.5–101.0)
MONO#: 0 10*3/uL — ABNORMAL LOW (ref 0.1–0.9)
MONO%: 0.3 % (ref 0.0–14.0)
NEUT#: 6 10*3/uL (ref 1.5–6.5)
NEUT%: 87.9 % — ABNORMAL HIGH (ref 38.4–76.8)
Platelets: 276 10*3/uL (ref 145–400)
RBC: 3.06 10*6/uL — ABNORMAL LOW (ref 3.70–5.45)
RDW: 20.6 % — ABNORMAL HIGH (ref 11.2–14.5)
WBC: 6.8 10*3/uL (ref 3.9–10.3)
lymph#: 0.8 10*3/uL — ABNORMAL LOW (ref 0.9–3.3)

## 2017-09-22 LAB — COMPREHENSIVE METABOLIC PANEL
ALT: 19 U/L (ref 0–55)
AST: 14 U/L (ref 5–34)
Albumin: 3.4 g/dL — ABNORMAL LOW (ref 3.5–5.0)
Alkaline Phosphatase: 120 U/L (ref 40–150)
Anion Gap: 12 mEq/L — ABNORMAL HIGH (ref 3–11)
BUN: 13.3 mg/dL (ref 7.0–26.0)
CO2: 20 mEq/L — ABNORMAL LOW (ref 22–29)
Calcium: 9.4 mg/dL (ref 8.4–10.4)
Chloride: 108 mEq/L (ref 98–109)
Creatinine: 0.8 mg/dL (ref 0.6–1.1)
EGFR: >60 mL/min/{1.73_m2} (ref >60)
Glucose: 274 mg/dl — ABNORMAL HIGH (ref 70–140)
Potassium: 3.7 mEq/L (ref 3.5–5.1)
Sodium: 140 mEq/L (ref 136–145)
Total Bilirubin: 0.45 mg/dL (ref 0.20–1.20)
Total Protein: 7.1 g/dL (ref 6.4–8.3)

## 2017-09-22 MED ORDER — HEPARIN SOD (PORK) LOCK FLUSH 100 UNIT/ML IV SOLN
500.0000 [IU] | Freq: Once | INTRAVENOUS | Status: AC | PRN
  Administered 2017-09-22: 500 [IU]
  Filled 2017-09-22: qty 5

## 2017-09-22 MED ORDER — DIPHENHYDRAMINE HCL 50 MG/ML IJ SOLN
50.0000 mg | Freq: Once | INTRAMUSCULAR | Status: AC
  Administered 2017-09-22: 50 mg via INTRAVENOUS

## 2017-09-22 MED ORDER — SODIUM CHLORIDE 0.9 % IV SOLN
Freq: Once | INTRAVENOUS | Status: AC
  Administered 2017-09-22: 12:00:00 via INTRAVENOUS
  Filled 2017-09-22: qty 5

## 2017-09-22 MED ORDER — PEGFILGRASTIM 6 MG/0.6ML ~~LOC~~ PSKT
6.0000 mg | PREFILLED_SYRINGE | Freq: Once | SUBCUTANEOUS | Status: AC
  Administered 2017-09-22: 6 mg via SUBCUTANEOUS
  Filled 2017-09-22: qty 0.6

## 2017-09-22 MED ORDER — SODIUM CHLORIDE 0.9% FLUSH
10.0000 mL | INTRAVENOUS | Status: DC | PRN
  Administered 2017-09-22: 10 mL
  Filled 2017-09-22: qty 10

## 2017-09-22 MED ORDER — PALONOSETRON HCL INJECTION 0.25 MG/5ML
0.2500 mg | Freq: Once | INTRAVENOUS | Status: AC
  Administered 2017-09-22: 0.25 mg via INTRAVENOUS

## 2017-09-22 MED ORDER — FAMOTIDINE IN NACL 20-0.9 MG/50ML-% IV SOLN
20.0000 mg | Freq: Once | INTRAVENOUS | Status: AC
  Administered 2017-09-22: 20 mg via INTRAVENOUS

## 2017-09-22 MED ORDER — CARBOPLATIN CHEMO INTRADERMAL TEST DOSE 100MCG/0.02ML
100.0000 ug | Freq: Once | INTRADERMAL | Status: AC
  Administered 2017-09-22: 100 ug via INTRADERMAL
  Filled 2017-09-22: qty 0.02

## 2017-09-22 MED ORDER — OXYCODONE HCL 5 MG PO TABS: 5.0000 mg | ORAL_TABLET | ORAL | 0 refills | Status: DC | PRN

## 2017-09-22 MED ORDER — INSULIN REGULAR HUMAN 100 UNIT/ML IJ SOLN
12.0000 [IU] | Freq: Once | INTRAMUSCULAR | Status: AC
  Administered 2017-09-22: 12 [IU] via SUBCUTANEOUS
  Filled 2017-09-22: qty 0.12

## 2017-09-22 MED ORDER — PALONOSETRON HCL INJECTION 0.25 MG/5ML
INTRAVENOUS | Status: AC
  Filled 2017-09-22: qty 5

## 2017-09-22 MED ORDER — FAMOTIDINE IN NACL 20-0.9 MG/50ML-% IV SOLN
INTRAVENOUS | Status: AC
  Filled 2017-09-22: qty 50

## 2017-09-22 MED ORDER — SODIUM CHLORIDE 0.9 % IV SOLN
Freq: Once | INTRAVENOUS | Status: AC
  Administered 2017-09-22: 11:00:00 via INTRAVENOUS

## 2017-09-22 MED ORDER — PACLITAXEL CHEMO INJECTION 300 MG/50ML
140.0000 mg/m2 | Freq: Once | INTRAVENOUS | Status: AC
  Administered 2017-09-22: 294 mg via INTRAVENOUS
  Filled 2017-09-22: qty 49

## 2017-09-22 MED ORDER — SODIUM CHLORIDE 0.9 % IV SOLN
600.0000 mg | Freq: Once | INTRAVENOUS | Status: AC
  Administered 2017-09-22: 600 mg via INTRAVENOUS
  Filled 2017-09-22: qty 60

## 2017-09-22 MED ORDER — DIPHENHYDRAMINE HCL 50 MG/ML IJ SOLN
INTRAMUSCULAR | Status: AC
  Filled 2017-09-22: qty 1

## 2017-09-22 NOTE — Patient Instructions (Signed)
[facility] Discharge Instructions for Patients Receiving Chemotherapy  Today you received the following chemotherapy agents:  Taxol, Carboplatin  To help prevent nausea and vomiting after your treatment, we encourage you to take your nausea medication as prescribed.   If you develop nausea and vomiting that is not controlled by your nausea medication, call the clinic.   BELOW ARE SYMPTOMS THAT SHOULD BE REPORTED IMMEDIATELY:  *FEVER GREATER THAN 100.5 F  *CHILLS WITH OR WITHOUT FEVER  NAUSEA AND VOMITING THAT IS NOT CONTROLLED WITH YOUR NAUSEA MEDICATION  *UNUSUAL SHORTNESS OF BREATH  *UNUSUAL BRUISING OR BLEEDING  TENDERNESS IN MOUTH AND THROAT WITH OR WITHOUT PRESENCE OF ULCERS  *URINARY PROBLEMS  *BOWEL PROBLEMS  UNUSUAL RASH Items with * indicate a potential emergency and should be followed up as soon as possible.  Feel free to call the clinic should you have any questions or concerns. The clinic phone number is [phone].  Please show the CHEMO ALERT CARD at check-in to the Emergency Department and triage nurse.   

## 2017-09-22 NOTE — Assessment & Plan Note (Signed)
She denies significant peripheral neuropathy from prior treatment She will continue gabapentin

## 2017-09-22 NOTE — Assessment & Plan Note (Signed)
I will proceed to give her insulin today I will reduce premedication by omitting oral dexamethasone in the morning of treatment

## 2017-09-22 NOTE — Assessment & Plan Note (Signed)
She has chronic atrial fibrillation, rate control She is on chronic anticoagulation therapy

## 2017-09-22 NOTE — Assessment & Plan Note (Signed)
We have review imaging study together She had excellent response to treatment However, CT scans to showed significant residual disease I recommend we proceed with cycle 4 of treatment I recommend minimum 6 cycles before repeat another imaging study and consider PARP inhibitor in the future

## 2017-09-22 NOTE — Patient Instructions (Signed)
Implanted Port Home Guide An implanted port is a type of central line that is placed under the skin. Central lines are used to provide IV access when treatment or nutrition needs to be given through a person's veins. Implanted ports are used for long-term IV access. An implanted port may be placed because:  You need IV medicine that would be irritating to the small veins in your hands or arms.  You need long-term IV medicines, such as antibiotics.  You need IV nutrition for a long period.  You need frequent blood draws for lab tests.  You need dialysis.  Implanted ports are usually placed in the chest area, but they can also be placed in the upper arm, the abdomen, or the leg. An implanted port has two main parts:  Reservoir. The reservoir is round and will appear as a small, raised area under your skin. The reservoir is the part where a needle is inserted to give medicines or draw blood.  Catheter. The catheter is a thin, flexible tube that extends from the reservoir. The catheter is placed into a large vein. Medicine that is inserted into the reservoir goes into the catheter and then into the vein.  How will I care for my incision site? Do not get the incision site wet. Bathe or shower as directed by your health care provider. How is my port accessed? Special steps must be taken to access the port:  Before the port is accessed, a numbing cream can be placed on the skin. This helps numb the skin over the port site.  Your health care provider uses a sterile technique to access the port. ? Your health care provider must put on a mask and sterile gloves. ? The skin over your port is cleaned carefully with an antiseptic and allowed to dry. ? The port is gently pinched between sterile gloves, and a needle is inserted into the port.  Only "non-coring" port needles should be used to access the port. Once the port is accessed, a blood return should be checked. This helps ensure that the port  is in the vein and is not clogged.  If your port needs to remain accessed for a constant infusion, a clear (transparent) bandage will be placed over the needle site. The bandage and needle will need to be changed every week, or as directed by your health care provider.  Keep the bandage covering the needle clean and dry. Do not get it wet. Follow your health care provider's instructions on how to take a shower or bath while the port is accessed.  If your port does not need to stay accessed, no bandage is needed over the port.  What is flushing? Flushing helps keep the port from getting clogged. Follow your health care provider's instructions on how and when to flush the port. Ports are usually flushed with saline solution or a medicine called heparin. The need for flushing will depend on how the port is used.  If the port is used for intermittent medicines or blood draws, the port will need to be flushed: ? After medicines have been given. ? After blood has been drawn. ? As part of routine maintenance.  If a constant infusion is running, the port may not need to be flushed.  How long will my port stay implanted? The port can stay in for as long as your health care provider thinks it is needed. When it is time for the port to come out, surgery will be   done to remove it. The procedure is similar to the one performed when the port was put in. When should I seek immediate medical care? When you have an implanted port, you should seek immediate medical care if:  You notice a bad smell coming from the incision site.  You have swelling, redness, or drainage at the incision site.  You have more swelling or pain at the port site or the surrounding area.  You have a fever that is not controlled with medicine.  This information is not intended to replace advice given to you by your health care provider. Make sure you discuss any questions you have with your health care provider. Document  Released: 11/23/2005 Document Revised: 04/30/2016 Document Reviewed: 07/31/2013 Elsevier Interactive Patient Education  2017 [employer]  

## 2017-09-22 NOTE — Progress Notes (Signed)
[place] OFFICE PROGRESS NOTE  Patient Care Team: Helane Rima, MD as PCP - General (Family Medicine) Encarnacion Slates, MD as Referring Physician (Neurology)  SUMMARY OF ONCOLOGIC HISTORY: Oncology History   Negative genetic testing     Ovarian cancer [facility])   11/28/2015 Imaging    Ct scan of abdomen: Peritoneal carcinomatosis and pelvic/inguinal adenopathy. Gynecologic primary is favored.      12/17/2015 Pathology Results    Lymph node, needle/core biopsy, L inguinal LAN - METASTATIC ADENOCARCINOMA. Microscopic Comment Immunohistochemistry will be performed and reported as an addendum. (JDP:ecj 12/18/2015) ADDENDUM: Immunohistochemistry shows strong positivity with cytokeratin 7, estrogen receptor (ER), progesterone receptor (PR), p53 and WT1. Negative markers are cytokeratin 20 , CDX- 2 and gross cystic disease fluid protein. 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 (JDP:kh 12-19-15      12/17/2015 Procedure    Technically successful ultrasound guided core left inguinal adenopathy biopsy      12/24/2015 Tumor Marker    Patient's tumor was tested for the following markers: CA-125 Results of the tumor marker test revealed 608.2      12/27/2015 - 02/28/2016 Chemotherapy    She received neoadjuvant chemo x 3 cycles      01/01/2016 Tumor Marker    Patient's tumor was tested for the following markers: CA-125 Results of the tumor marker test revealed 741.6      01/29/2016 Procedure    Successful placement of a right internal jugular approach power injectable Port-A-Cath. The catheter is ready for immediate use      01/29/2016 Imaging    US abdomen 1. Hyperechoic 2.5 x 1.0 x 1.6 cm lesion in the region the caudate lobe of the liver. Similar finding noted on prior CT of 11/28/2015. This could represent hemangioma. This could represent a malignancy including metastatic  disease. 2. Exam otherwise unremarkable. No gallstones. No biliary distention.      02/03/2016 Tumor Marker    Patient's tumor was tested for the following markers: CA-125 Results of the tumor marker test revealed 137.1      02/20/2016 Imaging    MRI brain No acute infarct.  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  No intracranial mass.  Moderate small vessel disease changes.  Global atrophy without hydrocephalus.  Expanded partially empty sella without secondary findings of pseudotumor cerebri.  Minimal mucosal thickening ethmoid sinus air cells. Small air-fluid levels maxillary sinuses bilaterally may indicate changes of acute sinusitis.       02/28/2016 Tumor Marker    Patient's tumor was tested for the following markers: CA-125 Results of the tumor marker test revealed 33.6      03/02/2016 Imaging    CT chest, abdomen and pelvis 1. Today's study demonstrates a positive response to therapy with resolution of the previously noted malignant ascites, significant regression of previously noted peritoneal implants, and regression of previously noted lymphadenopathy in the abdomen and pelvis. 2. No definite evidence to suggest metastatic disease to the thorax. 3. Stable 1.0 x 1.7 cm intermediate attenuation lesion associated with the posterior aspect of segment 1 of the liver, favored to represent a mildly proteinaceous hepatic cyst. The possibility of a peritoneal implant in this region is not entirely excluded, but is not favored on today's examination. 4. Extensive post infectious scarring inguinal upper right lung, likely sequela of prior necrotizing pneumonia. 5. Multiple tiny pulmonary nodules scattered throughout the lungs bilaterally all measuring 4 mm or  less. These are nonspecific, but favored to be benign. Given the patient's history of primary gynecologic malignancy, attention on followup studies is recommended to ensure the stability of these  findings. 6. Additional incidental findings, as above      03/14/2016 Imaging    CT angiogram 1. No pulmonary emboli. 2. Chronic changes to the right upper lobe. 3. Stable lesion in the caudate lobe of the liver. 4. Stable soft tissue prominence to the right of the trachea as described above. 5. Stable small nodule in the right lung.       03/24/2016 Pathology Results    1. Omentum, resection for tumor - FOCAL ADENOCARCINOMA ASSOCIATED WITH EXTENSIVE FIBROSIS, INFLAMMATION AND HEMOSIDERIN DEPOSITION. 2. Uterus +/- tubes/ovaries, neoplastic, cervix - CERVIX: SLIGHT CERVICITIS AND SQUAMOUS METAPLASIA. - ENDOMETRIUM: ATROPHIC, NO HYPERPLASIA OR MALIGNANCY. - MYOMETRIUM: LEIOMYOMATA WITH DEGENERATIVE CHANGES. NO MALIGNANCY. - UTERINE SEROSA: FOCAL ADENOCARCINOMA ASSOCIATED WITH ADHESIONS. - RIGHT OVARY: BENIGN CALCIFIED NODULE AND BENIGN SEROUS CYST. NO MALIGNANCY IDENTIFIED. - RIGHT FALLOPIAN TUBE: UNREMARKABLE. NO MALIGNANCY IDENTIFIED. - LEFT OVARY: FOCAL ADENOCARCINOMA ASSOCIATED WITH EXTENSIVE FIBROSIS. - LEFT FALLOPIAN TUBE: HYDROSALPINX. NO MALIGNANCY IDENTIFIED. 3. Lymph node, biopsy, right external iliac - METASTATIC ADENOCARCINOMA, 4. Soft tissue, biopsy, left para colic gutter - FOCAL ADENOCARCINOMA ASSOCIATED WITH FIBROSIS. Microscopic Comment 2. OVARY Specimen(s): Uterus with bilateral ovaries, omentum, right external iliac lymph node and left paracolic gutter. Procedure: (including lymph node sampling) Hysterectomy with bilateral salpingo-oophorectomy, omentectomy, lymph node biopsy and paracolic gutter biopsy. Primary tumor site (including laterality): Left ovary. Ovarian surface involvement: Yes Ovarian capsule intact without fragmentation: N/A Maximum tumor size (cm): 2 cm, 2 cm Histologic type: Serous carcinoma. Grade: 3 Peritoneal implants: (specify invasive or non-invasive): Left paracolic gutter positive for carcinoma Pelvic extension (list additional  structures on separate lines and if involved): Omentum, right paracolic gutter and uterine serosa. Lymph nodes: number examined 1 ; number positive 1 TNM code: ypT3a, ypN1b, ypMX FIGO Stage (based on pathologic findings, needs clinical correlation): IIIA2 Comments: The left ovary has a 2 cm nodule, which is composed primarily of fibrous tissue with small foci of residual adenocarcinoma. There is also microscopic involvement of the uterine serosa, the left paracolic gutter biopsy and the omentum. The left paracolic gutter and omental involvement is microscopic and both are associated with extensive fibrosis with focal hemosiderin deposition. The right external iliac node is completely replaced by metastatic adenocarcinoma with serous features. (JDP:ecj 03/30/2016)      03/24/2016 Surgery    Operation: Robotic-assisted laparoscopic total hysterectomy with bilateral salpingoophorectomy, omentectomy, radical tumor debulking  Surgeon: Donaciano Eva  Operative Findings:  : small nodules in omentum, no ascites. Omental nodule (2cm) adherent to lower anterior abdominal wall. 2cm left colonic gutter cystic nodule. 6cm right external iliac lymph node. Grossly normal ovaries and tubes. Fibroid uterus. No gross residual disease at completion of surgery representing R0 optimal/complete resection.       04/17/2016 Imaging    CT abdomen and pelvis 1. There is no evidence of acute inflammatory process within abdomen or pelvis. 2. Stable low-density lesion within caudate lobe of the liver. No new hepatic lesions are noted. 3. Again noted lobulated renal contour and multifocal renal cortical scarring. No hydronephrosis or hydroureter. 4. No significant mesenteric adenopathy. No retroperitoneal adenopathy. Stable minimal residual peritoneal thickening within pelvis. No new pelvic implants or pelvic ascites. 5. Status post hysterectomy.  Unremarkable urinary bladder. 6. Moderate stool within colon as  described above. No evidence of colitis.  04/24/2016 - 06/19/2016 Chemotherapy    She received 3 more cycles of chemo after surgery      05/14/2016 Tumor Marker    Patient's tumor was tested for the following markers: CA-125 Results of the tumor marker test revealed 22.5      06/04/2016 Tumor Marker    Patient's tumor was tested for the following markers: CA-125 Results of the tumor marker test revealed 13.7      07/07/2016 Genetic Testing    Patient has genetic testing done for breast/ovasrian cancer panel Results revealed patient has no actionable mutation      07/23/2016 Imaging    Ct abdomen and pelvis 1. Heterogeneously enhancing 4.9 x 3.2 x 4.4 cm mass along the right pelvic sidewall may represent locally recurrent disease or malignant lymphadenopathy. 2. No other signs of definite metastatic disease noted elsewhere in the abdomen or pelvis. 3. Stable low-attenuation hepatic lesion in the caudate lobe of the liver, which appears to demonstrates some progressive centripetal filling on delayed images. This lesion is presumably benign given its stability compared to prior studies, and is favored to represent a small cavernous hemangioma. 4. Cardiomegaly with biatrial dilatation, which is very severe on the right side. 5. Aortic atherosclerosis. 6. Additional incidental findings, as above.      07/23/2016 Tumor Marker    Patient's tumor was tested for the following markers: CA-125 Results of the tumor marker test revealed 12.3      09/30/2016 Imaging    Ct abdomen and pelvis: 1. 4.9 cm heterogeneously enhancing mass seen along the right pelvic sidewall previously has almost completely resolved. There is some ill-defined soft tissue attenuation/peritoneal thickening in this region today but no discrete measurable lesion is evident. 2. No new or progressive findings on today's exam. 3. Stable hypo attenuating lesion in the caudate lobe of the liver. 4. Subtle aortic  atherosclerosis      09/30/2016 Tumor Marker    Patient's tumor was tested for the following markers: CA-125 Results of the tumor marker test revealed 9.6      10/05/2016 Pathology Results    Vagina, biopsy, left cuff - BENIGN FIBROEPITHELIAL (STROMAL) POLYP. - NO DYSPLASIA, ATYPIA OR MALIGNANCY IDENTIFIED.      01/14/2017 Tumor Marker    Patient's tumor was tested for the following markers: CA-125 Results of the tumor marker test revealed 11.9      05/05/2017 Tumor Marker    Patient's tumor was tested for the following markers: CA-125 Results of the tumor marker test revealed 28      06/14/2017 Tumor Marker    Patient's tumor was tested for the following markers: CA-125 Results of the tumor marker test revealed 45.7      06/24/2017 Imaging    Ct abdomen and pelvis: Increased peritoneal metastatic disease in abdomen and pelvis since prior exam. No evidence of ascites. Stable small benign hepatic hemangioma.      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 Does of chemo is reduced starting cycle 2 onwards      09/02/2017 Tumor Marker    Patient's tumor was tested for the following markers: CA-125 Results of the tumor marker test revealed 27.7      09/16/2017  Imaging    CT CHEST IMPRESSION  1. No acute process or evidence of metastatic disease in the chest. 2. Right upper lung bronchiectasis, consolidation, and architectural distortion are most likely post infectious or inflammatory and not significantly changed. 3. Aortic Atherosclerosis (ICD10-I70.0).  CT ABDOMEN AND PELVIS IMPRESSION  1. Response to therapy of nodal and peritoneal metastasis. 2. No new or progressive disease. 3. Caudate lobe hemangioma, as before. 4.  Bilateral renal cortical thinning. Similar minimal right-sided caliectasis and hydroureter. Likely secondary to low-grade obstruction by the dominant right pelvic implant.       INTERVAL HISTORY: Please see below for problem oriented charting. She returns to be seen prior to cycle 4 of treatment She tolerated last treatment well Had mild but no vomiting Peripheral neuropathy about the same She complain of fatigue The patient denies any recent signs or symptoms of bleeding such as spontaneous epistaxis, hematuria or hematochezia.  REVIEW OF SYSTEMS:   Constitutional: Denies fevers, chills or abnormal weight loss Eyes: Denies blurriness of vision Ears, nose, mouth, throat, and face: Denies mucositis or sore throat Respiratory: Denies cough, dyspnea or wheezes Cardiovascular: Denies palpitation, chest discomfort or lower extremity swelling Gastrointestinal:  Denies nausea, heartburn or change in bowel habits Skin: Denies abnormal skin rashes Lymphatics: Denies new lymphadenopathy or easy bruising Neurological:Denies numbness, tingling or new weaknesses Behavioral/Psych: Mood is stable, no new changes  All other systems were reviewed with the patient and are negative.  I have reviewed the past medical history, past surgical history, social history and family history with the patient and they are unchanged from previous note.  ALLERGIES:  is allergic to codeine and latex.  MEDICATIONS:  Current Outpatient Prescriptions  Medication Sig Dispense Refill  . apixaban (ELIQUIS) 5 MG TABS tablet Take 1 tablet (5 mg total) by mouth 2 (two) times daily. 60 tablet 5  . aspirin EC 81 MG tablet Take 81 mg by mouth daily.    Marland Kitchen atorvastatin (LIPITOR) 40 MG tablet Take 40 mg by mouth at bedtime.    Marland Kitchen dexamethasone (DECADRON) 4 MG tablet Take 5 tabs at 10 pm the night before chemo and 5 tabs at 6 am the day of chemo, with food, by mouth 60 tablet 1  . gabapentin (NEURONTIN) 300 MG capsule Take 1  capsule (300 mg total) by mouth 2 (two) times daily. 60 capsule 1  . lidocaine-prilocaine (EMLA) cream Apply to Porta-Cath 1-2 hours prior to access as directed. 30 g 1  . loratadine (CLARITIN) 10 MG tablet Take 10 mg by mouth daily as needed (Recommended for chemo pain). Reported on 03/16/2016    . mirtazapine (REMERON) 15 MG tablet Take 15 mg by mouth at bedtime.    Marland Kitchen omega-3 acid ethyl esters (LOVAZA) 1 g capsule Take 1 g by mouth daily.    . ondansetron (ZOFRAN) 8 MG tablet Take 1 every 8 hours/as needed for nausea, start after 3rd day of chemo 30 tablet 1  . oxyCODONE (OXY IR/ROXICODONE) 5 MG immediate release tablet Take 1 tablet (5 mg total) by mouth every 4 (four) hours as needed for severe pain. 60 tablet 0  . PARoxetine (PAXIL) 20 MG tablet TAKE 1 TABLET BY MOUTH DAILY (Patient taking differently: TAKE 20 MG BY MOUTH DAILY) 90 tablet 0  . prochlorperazine (COMPAZINE) 10 MG tablet Take 1 tablet (10 mg total) by mouth every 6 (six) hours as needed (Nausea or vomiting). 30 tablet 1  . verapamil (CALAN-SR) 240 MG CR tablet Take 1 tablet (240  mg total) by mouth daily. 30 tablet 0   No current facility-administered medications for this visit.    Facility-Administered Medications Ordered in Other Visits  Medication Dose Route Frequency Provider Last Rate Last Dose  . CARBOplatin CHEMO intradermal Test Dose 100 mcg/0.31m  100 mcg Intradermal Once GAlvy Bimler Keeya Dyckman, MD      . insulin regular (NOVOLIN R,HUMULIN R) 100 units/mL injection 12 Units  12 Units Subcutaneous Once GAlvy Bimler Jasimine Simms, MD        PHYSICAL EXAMINATION: ECOG PERFORMANCE STATUS: 2 - Symptomatic, <50% confined to bed  Vitals:   09/22/17 0945  BP: (!) 137/93  Pulse: 84  Resp: 18  Temp: 98.4 F (36.9 C)  SpO2: 98%   Filed Weights   09/22/17 0945  Weight: 227 lb 3.2 oz (103.1 kg)    GENERAL:alert, no distress and comfortable SKIN: skin color, texture, turgor are normal, no rashes or significant lesions EYES: normal,  Conjunctiva are pink and non-injected, sclera clear OROPHARYNX:no exudate, no erythema and lips, buccal mucosa, and tongue normal  NECK: supple, thyroid normal size, non-tender, without nodularity LYMPH:  no palpable lymphadenopathy in the cervical, axillary or inguinal LUNGS: clear to auscultation and percussion with normal breathing effort HEART: regular rate & rhythm and no murmurs and no lower extremity edema ABDOMEN:abdomen soft, non-tender and normal bowel sounds Musculoskeletal:no cyanosis of digits and no clubbing  NEURO: alert & oriented x 3 with expressive dysphasia and focal neurological deficit LABORATORY DATA:  I have reviewed the data as listed    Component Value Date/Time   NA 140 09/22/2017 0913   K 3.7 09/22/2017 0913   CL 105 06/14/2017 1430   CO2 20 (L) 09/22/2017 0913   GLUCOSE 274 (H) 09/22/2017 0913   BUN 13.3 09/22/2017 0913   CREATININE 0.8 09/22/2017 0913   CALCIUM 9.4 09/22/2017 0913   PROT 7.1 09/22/2017 0913   ALBUMIN 3.4 (L) 09/22/2017 0913   AST 14 09/22/2017 0913   ALT 19 09/22/2017 0913   ALKPHOS 120 09/22/2017 0913   BILITOT 0.45 09/22/2017 0913   GFRNONAA 68 06/14/2017 1430   GFRAA 79 06/14/2017 1430    No results found for: SPEP, UPEP  Lab Results  Component Value Date   WBC 6.8 09/22/2017   NEUTROABS 6.0 09/22/2017   HGB 10.7 (L) 09/22/2017   HCT 32.5 (L) 09/22/2017   MCV 106.2 (H) 09/22/2017   PLT 276 09/22/2017      Chemistry      Component Value Date/Time   NA 140 09/22/2017 0913   K 3.7 09/22/2017 0913   CL 105 06/14/2017 1430   CO2 20 (L) 09/22/2017 0913   BUN 13.3 09/22/2017 0913   CREATININE 0.8 09/22/2017 0913      Component Value Date/Time   CALCIUM 9.4 09/22/2017 0913   ALKPHOS 120 09/22/2017 0913   AST 14 09/22/2017 0913   ALT 19 09/22/2017 0913   BILITOT 0.45 09/22/2017 0913       RADIOGRAPHIC STUDIES: I have reviewed imaging study with her and daughter I have personally reviewed the radiological images as  listed and agreed with the findings in the report. Ct Chest W Contrast  Result Date: 09/16/2017 CLINICAL DATA:  Left breast cancer diagnosed in 2008. Ovarian cancer in 2016. Chemotherapy. Left lumpectomy. Appendectomy. Tubal ligation. No current complaints. Primary peritoneal cancer. EXAM: CT CHEST, ABDOMEN, AND PELVIS WITH CONTRAST TECHNIQUE: Multidetector CT imaging of the chest, abdomen and pelvis was performed following the standard protocol during bolus administration of intravenous contrast.  CONTRAST:  100 cc of Isovue-300 COMPARISON:  Abdominopelvic CT of 06/24/2017. Most recent chest CT 03/14/2016. Chest radiograph 04/30/2016. FINDINGS: CT CHEST FINDINGS Cardiovascular: A right-sided Port-A-Cath which terminates at the mid right atrium. Aortic atherosclerosis. Tortuous thoracic aorta. Mild cardiomegaly with biatrial enlargement. No central pulmonary embolism, on this non-dedicated study. Mediastinum/Nodes: No supraclavicular adenopathy. Hypoattenuating bilateral thyroid nodules are likely similar and well-circumscribed. No axillary or subpectoral adenopathy. No mediastinal or hilar adenopathy. No internal mammary adenopathy. Lungs/Pleura: No pleural fluid. Patent airways. Right apical and upper lobe bronchiectasis with volume loss and architectural distortion. Mild consolidation in the adjacent superior segment right lower lobe. Morphology is not significantly changed. Minimal right lower lobe scarring. Similar superior segment right lower lobe nodularity, favoring a post infectious or inflammatory etiology. Minimal lingular and left lower lobe nodularity is also similar to the prior exam and can be presumed benign. Musculoskeletal: Surgical clips in the left breast with mild skin thickening. A lateral left breast nodule is favored to represent an intramammary lymph node. 9 mm on image 30/series 2, similar. No acute osseous abnormality. CT ABDOMEN PELVIS FINDINGS Hepatobiliary: Hypoattenuating caudate  lobe lesion in is similar in size at 1.8 cm and has been determined consistent with an hemangioma on priors. No new or suspicious liver lesion. Normal gallbladder, without biliary ductal dilatation. Pancreas: Normal, without mass or ductal dilatation. Spleen: Normal in size, without focal abnormality. Adrenals/Urinary Tract: Normal adrenal glands. Bilateral renal cortical scarring. Minimal right-sided caliectasis is similar. There is minimal right hydroureter within the abdomen. No distal hydroureter seen. Normal urinary bladder. Stomach/Bowel: Proximal gastric underdistention. Normal colon and terminal ileum. Normal small bowel caliber. Vascular/Lymphatic: Aortic atherosclerosis. No retroperitoneal or retrocrural adenopathy. Index jejunal mesenteric nodule or node measures 6 mm on image 81/series 2 versus 9 mm on the prior exam. An ileocolic mesenteric node measures 9 mm on image 96/series 2 versus 1.4 cm on the prior. 1.0 cm left external iliac node on image 116/series 2 is significantly decreased from 1.5 cm on the prior. Reproductive: Hysterectomy. Other: low-density implant in the [place] just cephalad to the vaginal cuff measures 2.5 x 3.2 cm on image 110/series 2. Compare 2.7 x 3.1 cm on the prior, similar. Right pelvic ill-defined soft tissue density implant is somewhat difficult to measure secondary to morphology. 3.2 x 2.1 cm on image 107/ series 2 versus 4.7 x 3.3 cm on the prior. Smaller nodules adjacent the terminal ileum and cecum are also significantly decreased, including on images 105 and 107 respectively. Musculoskeletal: Osteoarthritis of both hips.  Mild osteopenia. IMPRESSION: CT CHEST IMPRESSION 1.  No acute process or evidence of metastatic disease in the chest. 2. Right upper lung bronchiectasis, consolidation, and architectural distortion are most likely post infectious or inflammatory and not significantly changed. 3.  Aortic Atherosclerosis (ICD10-I70.0). CT ABDOMEN AND PELVIS IMPRESSION  1. Response to therapy of nodal and peritoneal metastasis. 2. No new or progressive disease. 3. Caudate lobe hemangioma, as before. 4. Bilateral renal cortical thinning. Similar minimal right-sided caliectasis and hydroureter. Likely secondary to low-grade obstruction by the dominant right pelvic implant. Electronically Signed   By: Abigail Miyamoto M.D.   On: 09/16/2017 14:55   Ct Abdomen Pelvis W Contrast  Result Date: 09/16/2017 CLINICAL DATA:  Left breast cancer diagnosed in 2008. Ovarian cancer in 2016. Chemotherapy. Left lumpectomy. Appendectomy. Tubal ligation. No current complaints. Primary peritoneal cancer. EXAM: CT CHEST, ABDOMEN, AND PELVIS WITH CONTRAST TECHNIQUE: Multidetector CT imaging of the chest, abdomen and pelvis was performed following the  standard protocol during bolus administration of intravenous contrast. CONTRAST:  100 cc of Isovue-300 COMPARISON:  Abdominopelvic CT of 06/24/2017. Most recent chest CT 03/14/2016. Chest radiograph 04/30/2016. FINDINGS: CT CHEST FINDINGS Cardiovascular: A right-sided Port-A-Cath which terminates at the mid right atrium. Aortic atherosclerosis. Tortuous thoracic aorta. Mild cardiomegaly with biatrial enlargement. No central pulmonary embolism, on this non-dedicated study. Mediastinum/Nodes: No supraclavicular adenopathy. Hypoattenuating bilateral thyroid nodules are likely similar and well-circumscribed. No axillary or subpectoral adenopathy. No mediastinal or hilar adenopathy. No internal mammary adenopathy. Lungs/Pleura: No pleural fluid. Patent airways. Right apical and upper lobe bronchiectasis with volume loss and architectural distortion. Mild consolidation in the adjacent superior segment right lower lobe. Morphology is not significantly changed. Minimal right lower lobe scarring. Similar superior segment right lower lobe nodularity, favoring a post infectious or inflammatory etiology. Minimal lingular and left lower lobe nodularity is also similar to  the prior exam and can be presumed benign. Musculoskeletal: Surgical clips in the left breast with mild skin thickening. A lateral left breast nodule is favored to represent an intramammary lymph node. 9 mm on image 30/series 2, similar. No acute osseous abnormality. CT ABDOMEN PELVIS FINDINGS Hepatobiliary: Hypoattenuating caudate lobe lesion in is similar in size at 1.8 cm and has been determined consistent with an hemangioma on priors. No new or suspicious liver lesion. Normal gallbladder, without biliary ductal dilatation. Pancreas: Normal, without mass or ductal dilatation. Spleen: Normal in size, without focal abnormality. Adrenals/Urinary Tract: Normal adrenal glands. Bilateral renal cortical scarring. Minimal right-sided caliectasis is similar. There is minimal right hydroureter within the abdomen. No distal hydroureter seen. Normal urinary bladder. Stomach/Bowel: Proximal gastric underdistention. Normal colon and terminal ileum. Normal small bowel caliber. Vascular/Lymphatic: Aortic atherosclerosis. No retroperitoneal or retrocrural adenopathy. Index jejunal mesenteric nodule or node measures 6 mm on image 81/series 2 versus 9 mm on the prior exam. An ileocolic mesenteric node measures 9 mm on image 96/series 2 versus 1.4 cm on the prior. 1.0 cm left external iliac node on image 116/series 2 is significantly decreased from 1.5 cm on the prior. Reproductive: Hysterectomy. Other: low-density implant in the [place] just cephalad to the vaginal cuff measures 2.5 x 3.2 cm on image 110/series 2. Compare 2.7 x 3.1 cm on the prior, similar. Right pelvic ill-defined soft tissue density implant is somewhat difficult to measure secondary to morphology. 3.2 x 2.1 cm on image 107/ series 2 versus 4.7 x 3.3 cm on the prior. Smaller nodules adjacent the terminal ileum and cecum are also significantly decreased, including on images 105 and 107 respectively. Musculoskeletal: Osteoarthritis of both hips.  Mild osteopenia.  IMPRESSION: CT CHEST IMPRESSION 1.  No acute process or evidence of metastatic disease in the chest. 2. Right upper lung bronchiectasis, consolidation, and architectural distortion are most likely post infectious or inflammatory and not significantly changed. 3.  Aortic Atherosclerosis (ICD10-I70.0). CT ABDOMEN AND PELVIS IMPRESSION 1. Response to therapy of nodal and peritoneal metastasis. 2. No new or progressive disease. 3. Caudate lobe hemangioma, as before. 4. Bilateral renal cortical thinning. Similar minimal right-sided caliectasis and hydroureter. Likely secondary to low-grade obstruction by the dominant right pelvic implant. Electronically Signed   By: Abigail Miyamoto M.D.   On: 09/16/2017 14:55    ASSESSMENT & PLAN:  Ovarian cancer ([place]) We have review imaging study together She had excellent response to treatment However, CT scans to showed significant residual disease I recommend we proceed with cycle 4 of treatment I recommend minimum 6 cycles before repeat another imaging study and  consider PARP inhibitor in the future  Chemotherapy-induced peripheral neuropathy ([facility]) She denies significant peripheral neuropathy from prior treatment She will continue gabapentin  Drug-induced hyperglycemia I will proceed to give her insulin today I will reduce premedication by omitting oral dexamethasone in the morning of treatment  Chronic atrial fibrillation ([place]) She has chronic atrial fibrillation, rate control She is on chronic anticoagulation therapy   No orders of the defined types were placed in this encounter.  All questions were answered. The patient knows to call the clinic with any problems, questions or concerns. No barriers to learning was detected. I spent 20 minutes counseling the patient face to face. The total time spent in the appointment was 30 minutes and more than 50% was on counseling and review of test results     Heath Lark, MD 09/22/2017 10:26 AM

## 2017-09-23 LAB — CA 125: Cancer Antigen (CA) 125: 18 U/mL (ref 0.0–38.1)

## 2017-09-24 ENCOUNTER — Telehealth: Payer: Self-pay | Admitting: Hematology and Oncology

## 2017-09-24 NOTE — Telephone Encounter (Signed)
Talk with patients daughter regarding november

## 2017-09-27 ENCOUNTER — Telehealth: Payer: Self-pay

## 2017-09-27 NOTE — Telephone Encounter (Signed)
I do not think a lift chair is appropriate in this setting She can get her PCP to order it

## 2017-09-27 NOTE — Telephone Encounter (Signed)
S/w [name] per Dr Alvy Bimler response.

## 2017-09-27 NOTE — Telephone Encounter (Signed)
Daughter is asking for a lift chair. The recliner kind that takes from sitting to standing. She did call [place] so she is asking for an Rx be sent to [facility].

## 2017-10-03 ENCOUNTER — Other Ambulatory Visit: Payer: Self-pay | Admitting: Hematology and Oncology

## 2017-10-13 ENCOUNTER — Ambulatory Visit (HOSPITAL_BASED_OUTPATIENT_CLINIC_OR_DEPARTMENT_OTHER): Payer: Medicare Other

## 2017-10-13 ENCOUNTER — Ambulatory Visit: Payer: Medicare Other

## 2017-10-13 ENCOUNTER — Ambulatory Visit (HOSPITAL_BASED_OUTPATIENT_CLINIC_OR_DEPARTMENT_OTHER): Payer: Medicare Other | Admitting: Hematology and Oncology

## 2017-10-13 ENCOUNTER — Other Ambulatory Visit (HOSPITAL_BASED_OUTPATIENT_CLINIC_OR_DEPARTMENT_OTHER): Payer: Medicare Other

## 2017-10-13 ENCOUNTER — Encounter: Payer: Self-pay | Admitting: Hematology and Oncology

## 2017-10-13 DIAGNOSIS — C563 Malignant neoplasm of bilateral ovaries: Secondary | ICD-10-CM

## 2017-10-13 DIAGNOSIS — G620 Drug-induced polyneuropathy: Secondary | ICD-10-CM

## 2017-10-13 DIAGNOSIS — C562 Malignant neoplasm of left ovary: Secondary | ICD-10-CM

## 2017-10-13 DIAGNOSIS — C800 Disseminated malignant neoplasm, unspecified: Secondary | ICD-10-CM

## 2017-10-13 DIAGNOSIS — G8191 Hemiplegia, unspecified affecting right dominant side: Secondary | ICD-10-CM | POA: Diagnosis not present

## 2017-10-13 DIAGNOSIS — Z5111 Encounter for antineoplastic chemotherapy: Secondary | ICD-10-CM

## 2017-10-13 DIAGNOSIS — I482 Chronic atrial fibrillation, unspecified: Secondary | ICD-10-CM

## 2017-10-13 DIAGNOSIS — Z95828 Presence of other vascular implants and grafts: Secondary | ICD-10-CM

## 2017-10-13 DIAGNOSIS — T451X5A Adverse effect of antineoplastic and immunosuppressive drugs, initial encounter: Secondary | ICD-10-CM

## 2017-10-13 DIAGNOSIS — C561 Malignant neoplasm of right ovary: Secondary | ICD-10-CM

## 2017-10-13 LAB — COMPREHENSIVE METABOLIC PANEL
ALT: 25 U/L (ref 0–55)
AST: 19 U/L (ref 5–34)
Albumin: 3.6 g/dL (ref 3.5–5.0)
Alkaline Phosphatase: 115 U/L (ref 40–150)
Anion Gap: 12 mEq/L — ABNORMAL HIGH (ref 3–11)
BUN: 15.1 mg/dL (ref 7.0–26.0)
CO2: 21 mEq/L — ABNORMAL LOW (ref 22–29)
Calcium: 9.9 mg/dL (ref 8.4–10.4)
Chloride: 107 mEq/L (ref 98–109)
Creatinine: 0.8 mg/dL (ref 0.6–1.1)
EGFR: >60 mL/min/{1.73_m2} (ref >60)
Glucose: 158 mg/dl — ABNORMAL HIGH (ref 70–140)
Potassium: 3.6 mEq/L (ref 3.5–5.1)
Sodium: 141 mEq/L (ref 136–145)
Total Bilirubin: 0.42 mg/dL (ref 0.20–1.20)
Total Protein: 7.4 g/dL (ref 6.4–8.3)

## 2017-10-13 LAB — CBC WITH DIFFERENTIAL/PLATELET
BASO%: 0.1 % (ref 0.0–2.0)
Basophils Absolute: 0 10*3/uL (ref 0.0–0.1)
EOS%: 0 % (ref 0.0–7.0)
Eosinophils Absolute: 0 10*3/uL (ref 0.0–0.5)
HCT: 34.6 % — ABNORMAL LOW (ref 34.8–46.6)
HGB: 11.6 g/dL (ref 11.6–15.9)
LYMPH%: 10.5 % — ABNORMAL LOW (ref 14.0–49.7)
MCH: 37 pg — ABNORMAL HIGH (ref 25.1–34.0)
MCHC: 33.6 g/dL (ref 31.5–36.0)
MCV: 110.4 fL — ABNORMAL HIGH (ref 79.5–101.0)
MONO#: 0 10*3/uL — ABNORMAL LOW (ref 0.1–0.9)
MONO%: 0.3 % (ref 0.0–14.0)
NEUT#: 8.2 10*3/uL — ABNORMAL HIGH (ref 1.5–6.5)
NEUT%: 89.1 % — ABNORMAL HIGH (ref 38.4–76.8)
Platelets: 293 10*3/uL (ref 145–400)
RBC: 3.13 10*6/uL — ABNORMAL LOW (ref 3.70–5.45)
RDW: 21.6 % — ABNORMAL HIGH (ref 11.2–14.5)
WBC: 9.2 10*3/uL (ref 3.9–10.3)
lymph#: 1 10*3/uL (ref 0.9–3.3)

## 2017-10-13 MED ORDER — FAMOTIDINE IN NACL 20-0.9 MG/50ML-% IV SOLN
20.0000 mg | Freq: Once | INTRAVENOUS | Status: AC
  Administered 2017-10-13: 20 mg via INTRAVENOUS

## 2017-10-13 MED ORDER — DIPHENHYDRAMINE HCL 50 MG/ML IJ SOLN
50.0000 mg | Freq: Once | INTRAMUSCULAR | Status: AC
  Administered 2017-10-13: 50 mg via INTRAVENOUS

## 2017-10-13 MED ORDER — PEGFILGRASTIM 6 MG/0.6ML ~~LOC~~ PSKT
6.0000 mg | PREFILLED_SYRINGE | Freq: Once | SUBCUTANEOUS | Status: AC
Start: 2017-10-13 — End: 2017-10-13
  Administered 2017-10-13: 6 mg via SUBCUTANEOUS
  Filled 2017-10-13: qty 0.6

## 2017-10-13 MED ORDER — PALONOSETRON HCL INJECTION 0.25 MG/5ML
INTRAVENOUS | Status: AC
  Filled 2017-10-13: qty 5

## 2017-10-13 MED ORDER — CARBOPLATIN CHEMO INTRADERMAL TEST DOSE 100MCG/0.02ML
100.0000 ug | Freq: Once | INTRADERMAL | Status: AC
  Administered 2017-10-13: 100 ug via INTRADERMAL
  Filled 2017-10-13: qty 0.02

## 2017-10-13 MED ORDER — SODIUM CHLORIDE 0.9% FLUSH
10.0000 mL | Freq: Once | INTRAVENOUS | Status: AC
  Administered 2017-10-13: 10 mL
  Filled 2017-10-13: qty 10

## 2017-10-13 MED ORDER — GABAPENTIN 300 MG PO CAPS: 300.0000 mg | ORAL_CAPSULE | Freq: Three times a day (TID) | ORAL | 3 refills | Status: DC

## 2017-10-13 MED ORDER — SODIUM CHLORIDE 0.9 % IV SOLN
Freq: Once | INTRAVENOUS | Status: AC
  Administered 2017-10-13: 13:00:00 via INTRAVENOUS

## 2017-10-13 MED ORDER — SODIUM CHLORIDE 0.9% FLUSH
10.0000 mL | INTRAVENOUS | Status: DC | PRN
  Administered 2017-10-13: 10 mL
  Filled 2017-10-13: qty 10

## 2017-10-13 MED ORDER — SODIUM CHLORIDE 0.9 % IV SOLN
87.5000 mg/m2 | Freq: Once | INTRAVENOUS | Status: AC
  Administered 2017-10-13: 186 mg via INTRAVENOUS
  Filled 2017-10-13: qty 31

## 2017-10-13 MED ORDER — CARBOPLATIN CHEMO INJECTION 600 MG/60ML
600.0000 mg | Freq: Once | INTRAVENOUS | Status: AC
  Administered 2017-10-13: 600 mg via INTRAVENOUS
  Filled 2017-10-13: qty 60

## 2017-10-13 MED ORDER — PALONOSETRON HCL INJECTION 0.25 MG/5ML
0.2500 mg | Freq: Once | INTRAVENOUS | Status: AC
  Administered 2017-10-13: 0.25 mg via INTRAVENOUS

## 2017-10-13 MED ORDER — FAMOTIDINE IN NACL 20-0.9 MG/50ML-% IV SOLN
INTRAVENOUS | Status: AC
  Filled 2017-10-13: qty 50

## 2017-10-13 MED ORDER — SODIUM CHLORIDE 0.9 % IV SOLN
Freq: Once | INTRAVENOUS | Status: AC
  Administered 2017-10-13: 14:00:00 via INTRAVENOUS
  Filled 2017-10-13: qty 5

## 2017-10-13 MED ORDER — DIPHENHYDRAMINE HCL 50 MG/ML IJ SOLN
INTRAMUSCULAR | Status: AC
  Filled 2017-10-13: qty 1

## 2017-10-13 MED ORDER — HEPARIN SOD (PORK) LOCK FLUSH 100 UNIT/ML IV SOLN
500.0000 [IU] | Freq: Once | INTRAVENOUS | Status: AC | PRN
  Administered 2017-10-13: 500 [IU]
  Filled 2017-10-13: qty 5

## 2017-10-13 NOTE — Assessment & Plan Note (Signed)
She has worsening peripheral neuropathy I plan to reduce chemotherapy of Taxol by 50% I recommend increasing the dose of gabapentin to 300 mg 3 times a day I will reassess in her next visit

## 2017-10-13 NOTE — Assessment & Plan Note (Signed)
She has chronic atrial fibrillation, rate control She is on chronic anticoagulation therapy

## 2017-10-13 NOTE — Assessment & Plan Note (Addendum)
The patient has persistent right-sided weakness and dysarthria She is able to function reasonably well with assistance at home She will continue risk factor modification.

## 2017-10-13 NOTE — Progress Notes (Signed)
[place] OFFICE PROGRESS NOTE  Patient Care Team: Helane Rima, MD as PCP - General (Family Medicine) Encarnacion Slates, MD as Referring Physician (Neurology)  SUMMARY OF ONCOLOGIC HISTORY: Oncology History   Negative genetic testing     Ovarian cancer [facility])   11/28/2015 Imaging    Ct scan of abdomen: Peritoneal carcinomatosis and pelvic/inguinal adenopathy. Gynecologic primary is favored.      12/17/2015 Pathology Results    Lymph node, needle/core biopsy, L inguinal LAN - METASTATIC ADENOCARCINOMA. Microscopic Comment Immunohistochemistry will be performed and reported as an addendum. (JDP:ecj 12/18/2015) ADDENDUM: Immunohistochemistry shows strong positivity with cytokeratin 7, estrogen receptor (ER), progesterone receptor (PR), p53 and WT1. Negative markers are cytokeratin 20 , CDX- 2 and gross cystic disease fluid protein. 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 (JDP:kh 12-19-15      12/17/2015 Procedure    Technically successful ultrasound guided core left inguinal adenopathy biopsy      12/24/2015 Tumor Marker    Patient's tumor was tested for the following markers: CA-125 Results of the tumor marker test revealed 608.2      12/27/2015 - 02/28/2016 Chemotherapy    She received neoadjuvant chemo x 3 cycles      01/01/2016 Tumor Marker    Patient's tumor was tested for the following markers: CA-125 Results of the tumor marker test revealed 741.6      01/29/2016 Procedure    Successful placement of a right internal jugular approach power injectable Port-A-Cath. The catheter is ready for immediate use      01/29/2016 Imaging    US abdomen 1. Hyperechoic 2.5 x 1.0 x 1.6 cm lesion in the region the caudate lobe of the liver. Similar finding noted on prior CT of 11/28/2015. This could represent hemangioma. This could represent a malignancy including metastatic  disease. 2. Exam otherwise unremarkable. No gallstones. No biliary distention.      02/03/2016 Tumor Marker    Patient's tumor was tested for the following markers: CA-125 Results of the tumor marker test revealed 137.1      02/20/2016 Imaging    MRI brain No acute infarct.  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  No intracranial mass.  Moderate small vessel disease changes.  Global atrophy without hydrocephalus.  Expanded partially empty sella without secondary findings of pseudotumor cerebri.  Minimal mucosal thickening ethmoid sinus air cells. Small air-fluid levels maxillary sinuses bilaterally may indicate changes of acute sinusitis.       02/28/2016 Tumor Marker    Patient's tumor was tested for the following markers: CA-125 Results of the tumor marker test revealed 33.6      03/02/2016 Imaging    CT chest, abdomen and pelvis 1. Today's study demonstrates a positive response to therapy with resolution of the previously noted malignant ascites, significant regression of previously noted peritoneal implants, and regression of previously noted lymphadenopathy in the abdomen and pelvis. 2. No definite evidence to suggest metastatic disease to the thorax. 3. Stable 1.0 x 1.7 cm intermediate attenuation lesion associated with the posterior aspect of segment 1 of the liver, favored to represent a mildly proteinaceous hepatic cyst. The possibility of a peritoneal implant in this region is not entirely excluded, but is not favored on today's examination. 4. Extensive post infectious scarring inguinal upper right lung, likely sequela of prior necrotizing pneumonia. 5. Multiple tiny pulmonary nodules scattered throughout the lungs bilaterally all measuring 4 mm or  less. These are nonspecific, but favored to be benign. Given the patient's history of primary gynecologic malignancy, attention on followup studies is recommended to ensure the stability of these  findings. 6. Additional incidental findings, as above      03/14/2016 Imaging    CT angiogram 1. No pulmonary emboli. 2. Chronic changes to the right upper lobe. 3. Stable lesion in the caudate lobe of the liver. 4. Stable soft tissue prominence to the right of the trachea as described above. 5. Stable small nodule in the right lung.       03/24/2016 Pathology Results    1. Omentum, resection for tumor - FOCAL ADENOCARCINOMA ASSOCIATED WITH EXTENSIVE FIBROSIS, INFLAMMATION AND HEMOSIDERIN DEPOSITION. 2. Uterus +/- tubes/ovaries, neoplastic, cervix - CERVIX: SLIGHT CERVICITIS AND SQUAMOUS METAPLASIA. - ENDOMETRIUM: ATROPHIC, NO HYPERPLASIA OR MALIGNANCY. - MYOMETRIUM: LEIOMYOMATA WITH DEGENERATIVE CHANGES. NO MALIGNANCY. - UTERINE SEROSA: FOCAL ADENOCARCINOMA ASSOCIATED WITH ADHESIONS. - RIGHT OVARY: BENIGN CALCIFIED NODULE AND BENIGN SEROUS CYST. NO MALIGNANCY IDENTIFIED. - RIGHT FALLOPIAN TUBE: UNREMARKABLE. NO MALIGNANCY IDENTIFIED. - LEFT OVARY: FOCAL ADENOCARCINOMA ASSOCIATED WITH EXTENSIVE FIBROSIS. - LEFT FALLOPIAN TUBE: HYDROSALPINX. NO MALIGNANCY IDENTIFIED. 3. Lymph node, biopsy, right external iliac - METASTATIC ADENOCARCINOMA, 4. Soft tissue, biopsy, left para colic gutter - FOCAL ADENOCARCINOMA ASSOCIATED WITH FIBROSIS. Microscopic Comment 2. OVARY Specimen(s): Uterus with bilateral ovaries, omentum, right external iliac lymph node and left paracolic gutter. Procedure: (including lymph node sampling) Hysterectomy with bilateral salpingo-oophorectomy, omentectomy, lymph node biopsy and paracolic gutter biopsy. Primary tumor site (including laterality): Left ovary. Ovarian surface involvement: Yes Ovarian capsule intact without fragmentation: N/A Maximum tumor size (cm): 2 cm, 2 cm Histologic type: Serous carcinoma. Grade: 3 Peritoneal implants: (specify invasive or non-invasive): Left paracolic gutter positive for carcinoma Pelvic extension (list additional  structures on separate lines and if involved): Omentum, right paracolic gutter and uterine serosa. Lymph nodes: number examined 1 ; number positive 1 TNM code: ypT3a, ypN1b, ypMX FIGO Stage (based on pathologic findings, needs clinical correlation): IIIA2 Comments: The left ovary has a 2 cm nodule, which is composed primarily of fibrous tissue with small foci of residual adenocarcinoma. There is also microscopic involvement of the uterine serosa, the left paracolic gutter biopsy and the omentum. The left paracolic gutter and omental involvement is microscopic and both are associated with extensive fibrosis with focal hemosiderin deposition. The right external iliac node is completely replaced by metastatic adenocarcinoma with serous features. (JDP:ecj 03/30/2016)      03/24/2016 Surgery    Operation: Robotic-assisted laparoscopic total hysterectomy with bilateral salpingoophorectomy, omentectomy, radical tumor debulking  Surgeon: Donaciano Eva  Operative Findings:  : small nodules in omentum, no ascites. Omental nodule (2cm) adherent to lower anterior abdominal wall. 2cm left colonic gutter cystic nodule. 6cm right external iliac lymph node. Grossly normal ovaries and tubes. Fibroid uterus. No gross residual disease at completion of surgery representing R0 optimal/complete resection.       04/17/2016 Imaging    CT abdomen and pelvis 1. There is no evidence of acute inflammatory process within abdomen or pelvis. 2. Stable low-density lesion within caudate lobe of the liver. No new hepatic lesions are noted. 3. Again noted lobulated renal contour and multifocal renal cortical scarring. No hydronephrosis or hydroureter. 4. No significant mesenteric adenopathy. No retroperitoneal adenopathy. Stable minimal residual peritoneal thickening within pelvis. No new pelvic implants or pelvic ascites. 5. Status post hysterectomy.  Unremarkable urinary bladder. 6. Moderate stool within colon as  described above. No evidence of colitis.  04/24/2016 - 06/19/2016 Chemotherapy    She received 3 more cycles of chemo after surgery      05/14/2016 Tumor Marker    Patient's tumor was tested for the following markers: CA-125 Results of the tumor marker test revealed 22.5      06/04/2016 Tumor Marker    Patient's tumor was tested for the following markers: CA-125 Results of the tumor marker test revealed 13.7      07/07/2016 Genetic Testing    Patient has genetic testing done for breast/ovasrian cancer panel Results revealed patient has no actionable mutation      07/23/2016 Imaging    Ct abdomen and pelvis 1. Heterogeneously enhancing 4.9 x 3.2 x 4.4 cm mass along the right pelvic sidewall may represent locally recurrent disease or malignant lymphadenopathy. 2. No other signs of definite metastatic disease noted elsewhere in the abdomen or pelvis. 3. Stable low-attenuation hepatic lesion in the caudate lobe of the liver, which appears to demonstrates some progressive centripetal filling on delayed images. This lesion is presumably benign given its stability compared to prior studies, and is favored to represent a small cavernous hemangioma. 4. Cardiomegaly with biatrial dilatation, which is very severe on the right side. 5. Aortic atherosclerosis. 6. Additional incidental findings, as above.      07/23/2016 Tumor Marker    Patient's tumor was tested for the following markers: CA-125 Results of the tumor marker test revealed 12.3      09/30/2016 Imaging    Ct abdomen and pelvis: 1. 4.9 cm heterogeneously enhancing mass seen along the right pelvic sidewall previously has almost completely resolved. There is some ill-defined soft tissue attenuation/peritoneal thickening in this region today but no discrete measurable lesion is evident. 2. No new or progressive findings on today's exam. 3. Stable hypo attenuating lesion in the caudate lobe of the liver. 4. Subtle aortic  atherosclerosis      09/30/2016 Tumor Marker    Patient's tumor was tested for the following markers: CA-125 Results of the tumor marker test revealed 9.6      10/05/2016 Pathology Results    Vagina, biopsy, left cuff - BENIGN FIBROEPITHELIAL (STROMAL) POLYP. - NO DYSPLASIA, ATYPIA OR MALIGNANCY IDENTIFIED.      01/14/2017 Tumor Marker    Patient's tumor was tested for the following markers: CA-125 Results of the tumor marker test revealed 11.9      05/05/2017 Tumor Marker    Patient's tumor was tested for the following markers: CA-125 Results of the tumor marker test revealed 28      06/14/2017 Tumor Marker    Patient's tumor was tested for the following markers: CA-125 Results of the tumor marker test revealed 45.7      06/24/2017 Imaging    Ct abdomen and pelvis: Increased peritoneal metastatic disease in abdomen and pelvis since prior exam. No evidence of ascites. Stable small benign hepatic hemangioma.      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 Does of chemo is reduced starting cycle 2 onwards      09/02/2017 Tumor Marker    Patient's tumor was tested for the following markers: CA-125 Results of the tumor marker test revealed 27.7      09/16/2017  Imaging    CT CHEST IMPRESSION  1. No acute process or evidence of metastatic disease in the chest. 2. Right upper lung bronchiectasis, consolidation, and architectural distortion are most likely post infectious or inflammatory and not significantly changed. 3. Aortic Atherosclerosis (ICD10-I70.0).  CT ABDOMEN AND PELVIS IMPRESSION  1. Response to therapy of nodal and peritoneal metastasis. 2. No new or progressive disease. 3. Caudate lobe hemangioma, as before. 4.  Bilateral renal cortical thinning. Similar minimal right-sided caliectasis and hydroureter. Likely secondary to low-grade obstruction by the dominant right pelvic implant.      09/22/2017 Tumor Marker    Patient's tumor was tested for the following markers: CA-125 Results of the tumor marker test revealed 18       INTERVAL HISTORY: Please see below for problem oriented charting. She returns for further follow-up with her daughter She complain of worsening peripheral neuropathy, mildly painful since her last chemotherapy She denies nausea, vomiting or changes in bowel habits No recent infection The patient denies any recent signs or symptoms of bleeding such as spontaneous epistaxis, hematuria or hematochezia.  REVIEW OF SYSTEMS:   Constitutional: Denies fevers, chills or abnormal weight loss Eyes: Denies blurriness of vision Ears, nose, mouth, throat, and face: Denies mucositis or sore throat Respiratory: Denies cough, dyspnea or wheezes Cardiovascular: Denies palpitation, chest discomfort or lower extremity swelling Gastrointestinal:  Denies nausea, heartburn or change in bowel habits Skin: Denies abnormal skin rashes Lymphatics: Denies new lymphadenopathy or easy bruising Neurological:Denies numbness, tingling or new weaknesses Behavioral/Psych: Mood is stable, no new changes  All other systems were reviewed with the patient and are negative.  I have reviewed the past medical history, past surgical history, social history and family history with the patient and they are unchanged from previous note.  ALLERGIES:  is allergic to codeine and latex.  MEDICATIONS:  Current Outpatient Medications  Medication Sig Dispense Refill  . apixaban (ELIQUIS) 5 MG TABS tablet Take 1 tablet (5 mg total) by mouth 2 (two) times daily. 60 tablet 5  . aspirin EC 81 MG tablet Take 81 mg by mouth daily.    Marland Kitchen atorvastatin (LIPITOR) 40 MG tablet Take 40 mg by mouth at bedtime.    Marland Kitchen dexamethasone  (DECADRON) 4 MG tablet Take 5 tabs at 10 pm the night before chemo and 5 tabs at 6 am the day of chemo, with food, by mouth 60 tablet 1  . gabapentin (NEURONTIN) 300 MG capsule Take 1 capsule (300 mg total) 3 (three) times daily by mouth. 90 capsule 3  . lidocaine-prilocaine (EMLA) cream Apply to Porta-Cath 1-2 hours prior to access as directed. 30 g 1  . loratadine (CLARITIN) 10 MG tablet Take 10 mg by mouth daily as needed (Recommended for chemo pain). Reported on 03/16/2016    . mirtazapine (REMERON) 15 MG tablet Take 15 mg by mouth at bedtime.    Marland Kitchen omega-3 acid ethyl esters (LOVAZA) 1 g capsule Take 1 g by mouth daily.    . ondansetron (ZOFRAN) 8 MG tablet Take 1 every 8 hours/as needed for nausea, start after 3rd day of chemo 30 tablet 1  . oxyCODONE (OXY IR/ROXICODONE) 5 MG immediate release tablet Take 1 tablet (5 mg total) by mouth every 4 (four) hours as needed for severe pain. 60 tablet 0  . PARoxetine (PAXIL) 20 MG tablet TAKE 1 TABLET BY MOUTH DAILY (Patient taking differently: TAKE 20 MG BY MOUTH DAILY) 90 tablet 0  . prochlorperazine (COMPAZINE) 10 MG tablet Take 1  tablet (10 mg total) by mouth every 6 (six) hours as needed (Nausea or vomiting). 30 tablet 1  . verapamil (CALAN-SR) 240 MG CR tablet Take 1 tablet (240 mg total) by mouth daily. 30 tablet 0   No current facility-administered medications for this visit.     PHYSICAL EXAMINATION: ECOG PERFORMANCE STATUS: 1 - Symptomatic but completely ambulatory  Vitals:   10/13/17 1129  BP: 138/73  Pulse: 96  Resp: 18  Temp: 98.4 F (36.9 C)  SpO2: 100%   Filed Weights   10/13/17 1129  Weight: 222 lb (100.7 kg)    GENERAL:alert, no distress and comfortable SKIN: skin color, texture, turgor are normal, no rashes or significant lesions EYES: normal, Conjunctiva are pink and non-injected, sclera clear OROPHARYNX:no exudate, no erythema and lips, buccal mucosa, and tongue normal  NECK: supple, thyroid normal size, non-tender,  without nodularity LYMPH:  no palpable lymphadenopathy in the cervical, axillary or inguinal LUNGS: clear to auscultation and percussion with normal breathing effort HEART: regular rate & rhythm and no murmurs and no lower extremity edema ABDOMEN:abdomen soft, non-tender and normal bowel sounds Musculoskeletal:no cyanosis of digits and no clubbing  NEURO: alert & oriented x 3 with dysarthria and weakness  LABORATORY DATA:  I have reviewed the data as listed    Component Value Date/Time   NA 140 09/22/2017 0913   K 3.7 09/22/2017 0913   CL 105 06/14/2017 1430   CO2 20 (L) 09/22/2017 0913   GLUCOSE 274 (H) 09/22/2017 0913   BUN 13.3 09/22/2017 0913   CREATININE 0.8 09/22/2017 0913   CALCIUM 9.4 09/22/2017 0913   PROT 7.1 09/22/2017 0913   ALBUMIN 3.4 (L) 09/22/2017 0913   AST 14 09/22/2017 0913   ALT 19 09/22/2017 0913   ALKPHOS 120 09/22/2017 0913   BILITOT 0.45 09/22/2017 0913   GFRNONAA 68 06/14/2017 1430   GFRAA 79 06/14/2017 1430    No results found for: SPEP, UPEP  Lab Results  Component Value Date   WBC 9.2 10/13/2017   NEUTROABS 8.2 (H) 10/13/2017   HGB 11.6 10/13/2017   HCT 34.6 (L) 10/13/2017   MCV 110.4 (H) 10/13/2017   PLT 293 10/13/2017      Chemistry      Component Value Date/Time   NA 140 09/22/2017 0913   K 3.7 09/22/2017 0913   CL 105 06/14/2017 1430   CO2 20 (L) 09/22/2017 0913   BUN 13.3 09/22/2017 0913   CREATININE 0.8 09/22/2017 0913      Component Value Date/Time   CALCIUM 9.4 09/22/2017 0913   ALKPHOS 120 09/22/2017 0913   AST 14 09/22/2017 0913   ALT 19 09/22/2017 0913   BILITOT 0.45 09/22/2017 0913       RADIOGRAPHIC STUDIES: I have personally reviewed the radiological images as listed and agreed with the findings in the report. Ct Chest W Contrast  Result Date: 09/16/2017 CLINICAL DATA:  Left breast cancer diagnosed in 2008. Ovarian cancer in 2016. Chemotherapy. Left lumpectomy. Appendectomy. Tubal ligation. No current  complaints. Primary peritoneal cancer. EXAM: CT CHEST, ABDOMEN, AND PELVIS WITH CONTRAST TECHNIQUE: Multidetector CT imaging of the chest, abdomen and pelvis was performed following the standard protocol during bolus administration of intravenous contrast. CONTRAST:  100 cc of Isovue-300 COMPARISON:  Abdominopelvic CT of 06/24/2017. Most recent chest CT 03/14/2016. Chest radiograph 04/30/2016. FINDINGS: CT CHEST FINDINGS Cardiovascular: A right-sided Port-A-Cath which terminates at the mid right atrium. Aortic atherosclerosis. Tortuous thoracic aorta. Mild cardiomegaly with biatrial enlargement. No central pulmonary  embolism, on this non-dedicated study. Mediastinum/Nodes: No supraclavicular adenopathy. Hypoattenuating bilateral thyroid nodules are likely similar and well-circumscribed. No axillary or subpectoral adenopathy. No mediastinal or hilar adenopathy. No internal mammary adenopathy. Lungs/Pleura: No pleural fluid. Patent airways. Right apical and upper lobe bronchiectasis with volume loss and architectural distortion. Mild consolidation in the adjacent superior segment right lower lobe. Morphology is not significantly changed. Minimal right lower lobe scarring. Similar superior segment right lower lobe nodularity, favoring a post infectious or inflammatory etiology. Minimal lingular and left lower lobe nodularity is also similar to the prior exam and can be presumed benign. Musculoskeletal: Surgical clips in the left breast with mild skin thickening. A lateral left breast nodule is favored to represent an intramammary lymph node. 9 mm on image 30/series 2, similar. No acute osseous abnormality. CT ABDOMEN PELVIS FINDINGS Hepatobiliary: Hypoattenuating caudate lobe lesion in is similar in size at 1.8 cm and has been determined consistent with an hemangioma on priors. No new or suspicious liver lesion. Normal gallbladder, without biliary ductal dilatation. Pancreas: Normal, without mass or ductal dilatation.  Spleen: Normal in size, without focal abnormality. Adrenals/Urinary Tract: Normal adrenal glands. Bilateral renal cortical scarring. Minimal right-sided caliectasis is similar. There is minimal right hydroureter within the abdomen. No distal hydroureter seen. Normal urinary bladder. Stomach/Bowel: Proximal gastric underdistention. Normal colon and terminal ileum. Normal small bowel caliber. Vascular/Lymphatic: Aortic atherosclerosis. No retroperitoneal or retrocrural adenopathy. Index jejunal mesenteric nodule or node measures 6 mm on image 81/series 2 versus 9 mm on the prior exam. An ileocolic mesenteric node measures 9 mm on image 96/series 2 versus 1.4 cm on the prior. 1.0 cm left external iliac node on image 116/series 2 is significantly decreased from 1.5 cm on the prior. Reproductive: Hysterectomy. Other: low-density implant in the [place] just cephalad to the vaginal cuff measures 2.5 x 3.2 cm on image 110/series 2. Compare 2.7 x 3.1 cm on the prior, similar. Right pelvic ill-defined soft tissue density implant is somewhat difficult to measure secondary to morphology. 3.2 x 2.1 cm on image 107/ series 2 versus 4.7 x 3.3 cm on the prior. Smaller nodules adjacent the terminal ileum and cecum are also significantly decreased, including on images 105 and 107 respectively. Musculoskeletal: Osteoarthritis of both hips.  Mild osteopenia. IMPRESSION: CT CHEST IMPRESSION 1.  No acute process or evidence of metastatic disease in the chest. 2. Right upper lung bronchiectasis, consolidation, and architectural distortion are most likely post infectious or inflammatory and not significantly changed. 3.  Aortic Atherosclerosis (ICD10-I70.0). CT ABDOMEN AND PELVIS IMPRESSION 1. Response to therapy of nodal and peritoneal metastasis. 2. No new or progressive disease. 3. Caudate lobe hemangioma, as before. 4. Bilateral renal cortical thinning. Similar minimal right-sided caliectasis and hydroureter. Likely secondary to  low-grade obstruction by the dominant right pelvic implant. Electronically Signed   By: Abigail Miyamoto M.D.   On: 09/16/2017 14:55   Ct Abdomen Pelvis W Contrast  Result Date: 09/16/2017 CLINICAL DATA:  Left breast cancer diagnosed in 2008. Ovarian cancer in 2016. Chemotherapy. Left lumpectomy. Appendectomy. Tubal ligation. No current complaints. Primary peritoneal cancer. EXAM: CT CHEST, ABDOMEN, AND PELVIS WITH CONTRAST TECHNIQUE: Multidetector CT imaging of the chest, abdomen and pelvis was performed following the standard protocol during bolus administration of intravenous contrast. CONTRAST:  100 cc of Isovue-300 COMPARISON:  Abdominopelvic CT of 06/24/2017. Most recent chest CT 03/14/2016. Chest radiograph 04/30/2016. FINDINGS: CT CHEST FINDINGS Cardiovascular: A right-sided Port-A-Cath which terminates at the mid right atrium. Aortic atherosclerosis. Tortuous thoracic aorta.  Mild cardiomegaly with biatrial enlargement. No central pulmonary embolism, on this non-dedicated study. Mediastinum/Nodes: No supraclavicular adenopathy. Hypoattenuating bilateral thyroid nodules are likely similar and well-circumscribed. No axillary or subpectoral adenopathy. No mediastinal or hilar adenopathy. No internal mammary adenopathy. Lungs/Pleura: No pleural fluid. Patent airways. Right apical and upper lobe bronchiectasis with volume loss and architectural distortion. Mild consolidation in the adjacent superior segment right lower lobe. Morphology is not significantly changed. Minimal right lower lobe scarring. Similar superior segment right lower lobe nodularity, favoring a post infectious or inflammatory etiology. Minimal lingular and left lower lobe nodularity is also similar to the prior exam and can be presumed benign. Musculoskeletal: Surgical clips in the left breast with mild skin thickening. A lateral left breast nodule is favored to represent an intramammary lymph node. 9 mm on image 30/series 2, similar. No acute  osseous abnormality. CT ABDOMEN PELVIS FINDINGS Hepatobiliary: Hypoattenuating caudate lobe lesion in is similar in size at 1.8 cm and has been determined consistent with an hemangioma on priors. No new or suspicious liver lesion. Normal gallbladder, without biliary ductal dilatation. Pancreas: Normal, without mass or ductal dilatation. Spleen: Normal in size, without focal abnormality. Adrenals/Urinary Tract: Normal adrenal glands. Bilateral renal cortical scarring. Minimal right-sided caliectasis is similar. There is minimal right hydroureter within the abdomen. No distal hydroureter seen. Normal urinary bladder. Stomach/Bowel: Proximal gastric underdistention. Normal colon and terminal ileum. Normal small bowel caliber. Vascular/Lymphatic: Aortic atherosclerosis. No retroperitoneal or retrocrural adenopathy. Index jejunal mesenteric nodule or node measures 6 mm on image 81/series 2 versus 9 mm on the prior exam. An ileocolic mesenteric node measures 9 mm on image 96/series 2 versus 1.4 cm on the prior. 1.0 cm left external iliac node on image 116/series 2 is significantly decreased from 1.5 cm on the prior. Reproductive: Hysterectomy. Other: low-density implant in the [place] just cephalad to the vaginal cuff measures 2.5 x 3.2 cm on image 110/series 2. Compare 2.7 x 3.1 cm on the prior, similar. Right pelvic ill-defined soft tissue density implant is somewhat difficult to measure secondary to morphology. 3.2 x 2.1 cm on image 107/ series 2 versus 4.7 x 3.3 cm on the prior. Smaller nodules adjacent the terminal ileum and cecum are also significantly decreased, including on images 105 and 107 respectively. Musculoskeletal: Osteoarthritis of both hips.  Mild osteopenia. IMPRESSION: CT CHEST IMPRESSION 1.  No acute process or evidence of metastatic disease in the chest. 2. Right upper lung bronchiectasis, consolidation, and architectural distortion are most likely post infectious or inflammatory and not  significantly changed. 3.  Aortic Atherosclerosis (ICD10-I70.0). CT ABDOMEN AND PELVIS IMPRESSION 1. Response to therapy of nodal and peritoneal metastasis. 2. No new or progressive disease. 3. Caudate lobe hemangioma, as before. 4. Bilateral renal cortical thinning. Similar minimal right-sided caliectasis and hydroureter. Likely secondary to low-grade obstruction by the dominant right pelvic implant. Electronically Signed   By: Abigail Miyamoto M.D.   On: 09/16/2017 14:55    ASSESSMENT & PLAN:  Ovarian cancer ([place]) She had excellent response to treatment However, CT scans to showed significant residual disease I recommend we proceed with cycle 5 of treatment I recommend minimum 6 cycles before repeat another imaging study and consider PARP inhibitor in the future  Chemotherapy-induced peripheral neuropathy ([place]) She has worsening peripheral neuropathy I plan to reduce chemotherapy of Taxol by 50% I recommend increasing the dose of gabapentin to 300 mg 3 times a day I will reassess in her next visit  Chronic atrial fibrillation [facility]) She has chronic  atrial fibrillation, rate control She is on chronic anticoagulation therapy  Right hemiparesis ([facility]) The patient has persistent right-sided weakness and dysarthria She is able to function reasonably well with assistance at home She will continue risk factor modification.   No orders of the defined types were placed in this encounter.  All questions were answered. The patient knows to call the clinic with any problems, questions or concerns. No barriers to learning was detected. I spent 25 minutes counseling the patient face to face. The total time spent in the appointment was 30 minutes and more than 50% was on counseling and review of test results     Heath Lark, MD 10/13/2017 11:45 AM

## 2017-10-13 NOTE — Assessment & Plan Note (Signed)
She had excellent response to treatment However, CT scans to showed significant residual disease I recommend we proceed with cycle 5 of treatment I recommend minimum 6 cycles before repeat another imaging study and consider PARP inhibitor in the future

## 2017-10-13 NOTE — Patient Instructions (Signed)
[facility] Discharge Instructions for Patients Receiving Chemotherapy  Today you received the following chemotherapy agents taxol/carboplatin  To help prevent nausea and vomiting after your treatment, we encourage you to take your nausea medication as directed   If you develop nausea and vomiting that is not controlled by your nausea medication, call the clinic.   BELOW ARE SYMPTOMS THAT SHOULD BE REPORTED IMMEDIATELY:  *FEVER GREATER THAN 100.5 F  *CHILLS WITH OR WITHOUT FEVER  NAUSEA AND VOMITING THAT IS NOT CONTROLLED WITH YOUR NAUSEA MEDICATION  *UNUSUAL SHORTNESS OF BREATH  *UNUSUAL BRUISING OR BLEEDING  TENDERNESS IN MOUTH AND THROAT WITH OR WITHOUT PRESENCE OF ULCERS  *URINARY PROBLEMS  *BOWEL PROBLEMS  UNUSUAL RASH Items with * indicate a potential emergency and should be followed up as soon as possible.  Feel free to call the clinic you have any questions or concerns. The clinic phone number is [phone].  

## 2017-11-03 ENCOUNTER — Ambulatory Visit (HOSPITAL_BASED_OUTPATIENT_CLINIC_OR_DEPARTMENT_OTHER): Payer: Medicare Other

## 2017-11-03 ENCOUNTER — Telehealth: Payer: Self-pay | Admitting: Hematology and Oncology

## 2017-11-03 ENCOUNTER — Ambulatory Visit (HOSPITAL_BASED_OUTPATIENT_CLINIC_OR_DEPARTMENT_OTHER): Payer: Medicare Other | Admitting: Hematology and Oncology

## 2017-11-03 ENCOUNTER — Other Ambulatory Visit (HOSPITAL_BASED_OUTPATIENT_CLINIC_OR_DEPARTMENT_OTHER): Payer: Medicare Other

## 2017-11-03 ENCOUNTER — Ambulatory Visit: Payer: Medicare Other

## 2017-11-03 VITALS — BP 143/81 | HR 101 | Temp 98.9°F | Resp 18 | Ht 65.0 in | Wt 224.8 lb

## 2017-11-03 VITALS — HR 98

## 2017-11-03 DIAGNOSIS — C561 Malignant neoplasm of right ovary: Secondary | ICD-10-CM

## 2017-11-03 DIAGNOSIS — C562 Malignant neoplasm of left ovary: Secondary | ICD-10-CM

## 2017-11-03 DIAGNOSIS — C563 Malignant neoplasm of bilateral ovaries: Secondary | ICD-10-CM

## 2017-11-03 DIAGNOSIS — T50905A Adverse effect of unspecified drugs, medicaments and biological substances, initial encounter: Secondary | ICD-10-CM

## 2017-11-03 DIAGNOSIS — I482 Chronic atrial fibrillation, unspecified: Secondary | ICD-10-CM

## 2017-11-03 DIAGNOSIS — G620 Drug-induced polyneuropathy: Secondary | ICD-10-CM | POA: Diagnosis not present

## 2017-11-03 DIAGNOSIS — Z5111 Encounter for antineoplastic chemotherapy: Secondary | ICD-10-CM

## 2017-11-03 DIAGNOSIS — C800 Disseminated malignant neoplasm, unspecified: Secondary | ICD-10-CM

## 2017-11-03 DIAGNOSIS — T451X5A Adverse effect of antineoplastic and immunosuppressive drugs, initial encounter: Secondary | ICD-10-CM

## 2017-11-03 DIAGNOSIS — R739 Hyperglycemia, unspecified: Secondary | ICD-10-CM

## 2017-11-03 DIAGNOSIS — Z95828 Presence of other vascular implants and grafts: Secondary | ICD-10-CM

## 2017-11-03 LAB — COMPREHENSIVE METABOLIC PANEL
ALT: 22 U/L (ref 0–55)
AST: 17 U/L (ref 5–34)
Albumin: 3.6 g/dL (ref 3.5–5.0)
Alkaline Phosphatase: 113 U/L (ref 40–150)
Anion Gap: 12 mEq/L — ABNORMAL HIGH (ref 3–11)
BUN: 16.4 mg/dL (ref 7.0–26.0)
CO2: 22 mEq/L (ref 22–29)
Calcium: 10 mg/dL (ref 8.4–10.4)
Chloride: 108 mEq/L (ref 98–109)
Creatinine: 0.8 mg/dL (ref 0.6–1.1)
EGFR: >60 mL/min/{1.73_m2} (ref >60)
Glucose: 144 mg/dl — ABNORMAL HIGH (ref 70–140)
Potassium: 3.7 mEq/L (ref 3.5–5.1)
Sodium: 142 mEq/L (ref 136–145)
Total Bilirubin: 0.5 mg/dL (ref 0.20–1.20)
Total Protein: 7.3 g/dL (ref 6.4–8.3)

## 2017-11-03 LAB — CBC WITH DIFFERENTIAL/PLATELET
BASO%: 0.1 % (ref 0.0–2.0)
Basophils Absolute: 0 10*3/uL (ref 0.0–0.1)
EOS%: 0 % (ref 0.0–7.0)
Eosinophils Absolute: 0 10*3/uL (ref 0.0–0.5)
HCT: 34.8 % (ref 34.8–46.6)
HGB: 11.6 g/dL (ref 11.6–15.9)
LYMPH%: 8.4 % — ABNORMAL LOW (ref 14.0–49.7)
MCH: 37.3 pg — ABNORMAL HIGH (ref 25.1–34.0)
MCHC: 33.3 g/dL (ref 31.5–36.0)
MCV: 111.9 fL — ABNORMAL HIGH (ref 79.5–101.0)
MONO#: 0 10*3/uL — ABNORMAL LOW (ref 0.1–0.9)
MONO%: 0.5 % (ref 0.0–14.0)
NEUT#: 7.9 10*3/uL — ABNORMAL HIGH (ref 1.5–6.5)
NEUT%: 91 % — ABNORMAL HIGH (ref 38.4–76.8)
Platelets: 231 10*3/uL (ref 145–400)
RBC: 3.11 10*6/uL — ABNORMAL LOW (ref 3.70–5.45)
RDW: 19.6 % — ABNORMAL HIGH (ref 11.2–14.5)
WBC: 8.7 10*3/uL (ref 3.9–10.3)
lymph#: 0.7 10*3/uL — ABNORMAL LOW (ref 0.9–3.3)

## 2017-11-03 MED ORDER — DIPHENHYDRAMINE HCL 50 MG/ML IJ SOLN
INTRAMUSCULAR | Status: AC
  Filled 2017-11-03: qty 1

## 2017-11-03 MED ORDER — HEPARIN SOD (PORK) LOCK FLUSH 100 UNIT/ML IV SOLN
500.0000 [IU] | Freq: Once | INTRAVENOUS | Status: AC | PRN
  Administered 2017-11-03: 500 [IU]
  Filled 2017-11-03: qty 5

## 2017-11-03 MED ORDER — FAMOTIDINE IN NACL 20-0.9 MG/50ML-% IV SOLN
20.0000 mg | Freq: Once | INTRAVENOUS | Status: AC
  Administered 2017-11-03: 20 mg via INTRAVENOUS

## 2017-11-03 MED ORDER — PEGFILGRASTIM 6 MG/0.6ML ~~LOC~~ PSKT
6.0000 mg | PREFILLED_SYRINGE | Freq: Once | SUBCUTANEOUS | Status: AC
  Administered 2017-11-03: 6 mg via SUBCUTANEOUS
  Filled 2017-11-03: qty 0.6

## 2017-11-03 MED ORDER — SODIUM CHLORIDE 0.9 % IV SOLN
Freq: Once | INTRAVENOUS | Status: AC
  Administered 2017-11-03: 12:00:00 via INTRAVENOUS
  Filled 2017-11-03: qty 5

## 2017-11-03 MED ORDER — SODIUM CHLORIDE 0.9 % IV SOLN
Freq: Once | INTRAVENOUS | Status: AC
  Administered 2017-11-03: 11:00:00 via INTRAVENOUS

## 2017-11-03 MED ORDER — SODIUM CHLORIDE 0.9% FLUSH
10.0000 mL | INTRAVENOUS | Status: DC | PRN
  Administered 2017-11-03: 10 mL
  Filled 2017-11-03: qty 10

## 2017-11-03 MED ORDER — SODIUM CHLORIDE 0.9% FLUSH
10.0000 mL | Freq: Once | INTRAVENOUS | Status: AC
  Administered 2017-11-03: 10 mL
  Filled 2017-11-03: qty 10

## 2017-11-03 MED ORDER — OXYCODONE HCL 5 MG PO TABS: 5.0000 mg | ORAL_TABLET | ORAL | 0 refills | Status: DC | PRN

## 2017-11-03 MED ORDER — SODIUM CHLORIDE 0.9 % IV SOLN
600.0000 mg | Freq: Once | INTRAVENOUS | Status: AC
  Administered 2017-11-03: 600 mg via INTRAVENOUS
  Filled 2017-11-03: qty 60

## 2017-11-03 MED ORDER — SODIUM CHLORIDE 0.9 % IV SOLN
87.5000 mg/m2 | Freq: Once | INTRAVENOUS | Status: AC
  Administered 2017-11-03: 186 mg via INTRAVENOUS
  Filled 2017-11-03: qty 31

## 2017-11-03 MED ORDER — FAMOTIDINE IN NACL 20-0.9 MG/50ML-% IV SOLN
INTRAVENOUS | Status: AC
  Filled 2017-11-03: qty 50

## 2017-11-03 MED ORDER — PALONOSETRON HCL INJECTION 0.25 MG/5ML
INTRAVENOUS | Status: AC
  Filled 2017-11-03: qty 5

## 2017-11-03 MED ORDER — PALONOSETRON HCL INJECTION 0.25 MG/5ML
0.2500 mg | Freq: Once | INTRAVENOUS | Status: AC
  Administered 2017-11-03: 0.25 mg via INTRAVENOUS

## 2017-11-03 MED ORDER — DIPHENHYDRAMINE HCL 50 MG/ML IJ SOLN
50.0000 mg | Freq: Once | INTRAMUSCULAR | Status: AC
  Administered 2017-11-03: 50 mg via INTRAVENOUS

## 2017-11-03 NOTE — Patient Instructions (Signed)
   [facility] Discharge Instructions for Patients Receiving Chemotherapy  Today you received the following chemotherapy agents Taxol and Carboplatin   To help prevent nausea and vomiting after your treatment, we encourage you to take your nausea medication as directed.    If you develop nausea and vomiting that is not controlled by your nausea medication, call the clinic.   BELOW ARE SYMPTOMS THAT SHOULD BE REPORTED IMMEDIATELY:  *FEVER GREATER THAN 100.5 F  *CHILLS WITH OR WITHOUT FEVER  NAUSEA AND VOMITING THAT IS NOT CONTROLLED WITH YOUR NAUSEA MEDICATION  *UNUSUAL SHORTNESS OF BREATH  *UNUSUAL BRUISING OR BLEEDING  TENDERNESS IN MOUTH AND THROAT WITH OR WITHOUT PRESENCE OF ULCERS  *URINARY PROBLEMS  *BOWEL PROBLEMS  UNUSUAL RASH Items with * indicate a potential emergency and should be followed up as soon as possible.  Feel free to call the clinic should you have any questions or concerns. The clinic phone number is [phone].  Please show the CHEMO ALERT CARD at check-in to the Emergency Department and triage nurse.   

## 2017-11-03 NOTE — Telephone Encounter (Signed)
Gave avs and calendar for december °

## 2017-11-04 LAB — CA 125: Cancer Antigen (CA) 125: 13.4 U/mL (ref 0.0–38.1)

## 2017-11-05 ENCOUNTER — Encounter: Payer: Self-pay | Admitting: Hematology and Oncology

## 2017-11-05 MED ORDER — NIRAPARIB TOSYLATE 100 MG PO CAPS: 300.0000 mg | ORAL_CAPSULE | Freq: Every day | ORAL | 11 refills | Status: DC

## 2017-11-05 NOTE — Assessment & Plan Note (Signed)
With recent reduction on premedication of dexamethasone, her blood sugar is better We will continue to monitor her blood sugar closely while on treatment

## 2017-11-05 NOTE — Assessment & Plan Note (Signed)
She denies significant peripheral neuropathy from prior treatment She will continue gabapentin

## 2017-11-05 NOTE — Assessment & Plan Note (Signed)
She had excellent response to treatment However, CT scans to showed significant residual disease I recommend we proceed with cycle 6 of treatment I I plan to repeat another CT scan and consider PARP inhibitor in the future as maintenance therapy I will get assistance from my pharmacist to obtain insurance prior authorization and copayment assistance I do not plan to start her on Niraparib until results of CT is available

## 2017-11-05 NOTE — Progress Notes (Signed)
[place] OFFICE PROGRESS NOTE  Patient Care Team: Helane Rima, MD as PCP - General (Family Medicine) Encarnacion Slates, MD as Referring Physician (Neurology)  SUMMARY OF ONCOLOGIC HISTORY: Oncology History   Negative genetic testing     Ovarian cancer [facility])   11/28/2015 Imaging    Ct scan of abdomen: Peritoneal carcinomatosis and pelvic/inguinal adenopathy. Gynecologic primary is favored.      12/17/2015 Pathology Results    Lymph node, needle/core biopsy, L inguinal LAN - METASTATIC ADENOCARCINOMA. Microscopic Comment Immunohistochemistry will be performed and reported as an addendum. (JDP:ecj 12/18/2015) ADDENDUM: Immunohistochemistry shows strong positivity with cytokeratin 7, estrogen receptor (ER), progesterone receptor (PR), p53 and WT1. Negative markers are cytokeratin 20 , CDX- 2 and gross cystic disease fluid protein. 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 (JDP:kh 12-19-15      12/17/2015 Procedure    Technically successful ultrasound guided core left inguinal adenopathy biopsy      12/24/2015 Tumor Marker    Patient's tumor was tested for the following markers: CA-125 Results of the tumor marker test revealed 608.2      12/27/2015 - 02/28/2016 Chemotherapy    She received neoadjuvant chemo x 3 cycles      01/01/2016 Tumor Marker    Patient's tumor was tested for the following markers: CA-125 Results of the tumor marker test revealed 741.6      01/29/2016 Procedure    Successful placement of a right internal jugular approach power injectable Port-A-Cath. The catheter is ready for immediate use      01/29/2016 Imaging    US abdomen 1. Hyperechoic 2.5 x 1.0 x 1.6 cm lesion in the region the caudate lobe of the liver. Similar finding noted on prior CT of 11/28/2015. This could represent hemangioma. This could represent a malignancy including metastatic  disease. 2. Exam otherwise unremarkable. No gallstones. No biliary distention.      02/03/2016 Tumor Marker    Patient's tumor was tested for the following markers: CA-125 Results of the tumor marker test revealed 137.1      02/20/2016 Imaging    MRI brain No acute infarct.  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  No intracranial mass.  Moderate small vessel disease changes.  Global atrophy without hydrocephalus.  Expanded partially empty sella without secondary findings of pseudotumor cerebri.  Minimal mucosal thickening ethmoid sinus air cells. Small air-fluid levels maxillary sinuses bilaterally may indicate changes of acute sinusitis.       02/28/2016 Tumor Marker    Patient's tumor was tested for the following markers: CA-125 Results of the tumor marker test revealed 33.6      03/02/2016 Imaging    CT chest, abdomen and pelvis 1. Today's study demonstrates a positive response to therapy with resolution of the previously noted malignant ascites, significant regression of previously noted peritoneal implants, and regression of previously noted lymphadenopathy in the abdomen and pelvis. 2. No definite evidence to suggest metastatic disease to the thorax. 3. Stable 1.0 x 1.7 cm intermediate attenuation lesion associated with the posterior aspect of segment 1 of the liver, favored to represent a mildly proteinaceous hepatic cyst. The possibility of a peritoneal implant in this region is not entirely excluded, but is not favored on today's examination. 4. Extensive post infectious scarring inguinal upper right lung, likely sequela of prior necrotizing pneumonia. 5. Multiple tiny pulmonary nodules scattered throughout the lungs bilaterally all measuring 4 mm or  less. These are nonspecific, but favored to be benign. Given the patient's history of primary gynecologic malignancy, attention on followup studies is recommended to ensure the stability of these  findings. 6. Additional incidental findings, as above      03/14/2016 Imaging    CT angiogram 1. No pulmonary emboli. 2. Chronic changes to the right upper lobe. 3. Stable lesion in the caudate lobe of the liver. 4. Stable soft tissue prominence to the right of the trachea as described above. 5. Stable small nodule in the right lung.       03/24/2016 Pathology Results    1. Omentum, resection for tumor - FOCAL ADENOCARCINOMA ASSOCIATED WITH EXTENSIVE FIBROSIS, INFLAMMATION AND HEMOSIDERIN DEPOSITION. 2. Uterus +/- tubes/ovaries, neoplastic, cervix - CERVIX: SLIGHT CERVICITIS AND SQUAMOUS METAPLASIA. - ENDOMETRIUM: ATROPHIC, NO HYPERPLASIA OR MALIGNANCY. - MYOMETRIUM: LEIOMYOMATA WITH DEGENERATIVE CHANGES. NO MALIGNANCY. - UTERINE SEROSA: FOCAL ADENOCARCINOMA ASSOCIATED WITH ADHESIONS. - RIGHT OVARY: BENIGN CALCIFIED NODULE AND BENIGN SEROUS CYST. NO MALIGNANCY IDENTIFIED. - RIGHT FALLOPIAN TUBE: UNREMARKABLE. NO MALIGNANCY IDENTIFIED. - LEFT OVARY: FOCAL ADENOCARCINOMA ASSOCIATED WITH EXTENSIVE FIBROSIS. - LEFT FALLOPIAN TUBE: HYDROSALPINX. NO MALIGNANCY IDENTIFIED. 3. Lymph node, biopsy, right external iliac - METASTATIC ADENOCARCINOMA, 4. Soft tissue, biopsy, left para colic gutter - FOCAL ADENOCARCINOMA ASSOCIATED WITH FIBROSIS. Microscopic Comment 2. OVARY Specimen(s): Uterus with bilateral ovaries, omentum, right external iliac lymph node and left paracolic gutter. Procedure: (including lymph node sampling) Hysterectomy with bilateral salpingo-oophorectomy, omentectomy, lymph node biopsy and paracolic gutter biopsy. Primary tumor site (including laterality): Left ovary. Ovarian surface involvement: Yes Ovarian capsule intact without fragmentation: N/A Maximum tumor size (cm): 2 cm, 2 cm Histologic type: Serous carcinoma. Grade: 3 Peritoneal implants: (specify invasive or non-invasive): Left paracolic gutter positive for carcinoma Pelvic extension (list additional  structures on separate lines and if involved): Omentum, right paracolic gutter and uterine serosa. Lymph nodes: number examined 1 ; number positive 1 TNM code: ypT3a, ypN1b, ypMX FIGO Stage (based on pathologic findings, needs clinical correlation): IIIA2 Comments: The left ovary has a 2 cm nodule, which is composed primarily of fibrous tissue with small foci of residual adenocarcinoma. There is also microscopic involvement of the uterine serosa, the left paracolic gutter biopsy and the omentum. The left paracolic gutter and omental involvement is microscopic and both are associated with extensive fibrosis with focal hemosiderin deposition. The right external iliac node is completely replaced by metastatic adenocarcinoma with serous features. (JDP:ecj 03/30/2016)      03/24/2016 Surgery    Operation: Robotic-assisted laparoscopic total hysterectomy with bilateral salpingoophorectomy, omentectomy, radical tumor debulking  Surgeon: Donaciano Eva  Operative Findings:  : small nodules in omentum, no ascites. Omental nodule (2cm) adherent to lower anterior abdominal wall. 2cm left colonic gutter cystic nodule. 6cm right external iliac lymph node. Grossly normal ovaries and tubes. Fibroid uterus. No gross residual disease at completion of surgery representing R0 optimal/complete resection.       04/17/2016 Imaging    CT abdomen and pelvis 1. There is no evidence of acute inflammatory process within abdomen or pelvis. 2. Stable low-density lesion within caudate lobe of the liver. No new hepatic lesions are noted. 3. Again noted lobulated renal contour and multifocal renal cortical scarring. No hydronephrosis or hydroureter. 4. No significant mesenteric adenopathy. No retroperitoneal adenopathy. Stable minimal residual peritoneal thickening within pelvis. No new pelvic implants or pelvic ascites. 5. Status post hysterectomy.  Unremarkable urinary bladder. 6. Moderate stool within colon as  described above. No evidence of colitis.  04/24/2016 - 06/19/2016 Chemotherapy    She received 3 more cycles of chemo after surgery      05/14/2016 Tumor Marker    Patient's tumor was tested for the following markers: CA-125 Results of the tumor marker test revealed 22.5      06/04/2016 Tumor Marker    Patient's tumor was tested for the following markers: CA-125 Results of the tumor marker test revealed 13.7      07/07/2016 Genetic Testing    Patient has genetic testing done for breast/ovasrian cancer panel Results revealed patient has no actionable mutation      07/23/2016 Imaging    Ct abdomen and pelvis 1. Heterogeneously enhancing 4.9 x 3.2 x 4.4 cm mass along the right pelvic sidewall may represent locally recurrent disease or malignant lymphadenopathy. 2. No other signs of definite metastatic disease noted elsewhere in the abdomen or pelvis. 3. Stable low-attenuation hepatic lesion in the caudate lobe of the liver, which appears to demonstrates some progressive centripetal filling on delayed images. This lesion is presumably benign given its stability compared to prior studies, and is favored to represent a small cavernous hemangioma. 4. Cardiomegaly with biatrial dilatation, which is very severe on the right side. 5. Aortic atherosclerosis. 6. Additional incidental findings, as above.      07/23/2016 Tumor Marker    Patient's tumor was tested for the following markers: CA-125 Results of the tumor marker test revealed 12.3      09/30/2016 Imaging    Ct abdomen and pelvis: 1. 4.9 cm heterogeneously enhancing mass seen along the right pelvic sidewall previously has almost completely resolved. There is some ill-defined soft tissue attenuation/peritoneal thickening in this region today but no discrete measurable lesion is evident. 2. No new or progressive findings on today's exam. 3. Stable hypo attenuating lesion in the caudate lobe of the liver. 4. Subtle aortic  atherosclerosis      09/30/2016 Tumor Marker    Patient's tumor was tested for the following markers: CA-125 Results of the tumor marker test revealed 9.6      10/05/2016 Pathology Results    Vagina, biopsy, left cuff - BENIGN FIBROEPITHELIAL (STROMAL) POLYP. - NO DYSPLASIA, ATYPIA OR MALIGNANCY IDENTIFIED.      01/14/2017 Tumor Marker    Patient's tumor was tested for the following markers: CA-125 Results of the tumor marker test revealed 11.9      05/05/2017 Tumor Marker    Patient's tumor was tested for the following markers: CA-125 Results of the tumor marker test revealed 28      06/14/2017 Tumor Marker    Patient's tumor was tested for the following markers: CA-125 Results of the tumor marker test revealed 45.7      06/24/2017 Imaging    Ct abdomen and pelvis: Increased peritoneal metastatic disease in abdomen and pelvis since prior exam. No evidence of ascites. Stable small benign hepatic hemangioma.      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 Does of chemo is reduced starting cycle 2 onwards      09/02/2017 Tumor Marker    Patient's tumor was tested for the following markers: CA-125 Results of the tumor marker test revealed 27.7      09/16/2017  Imaging    CT CHEST IMPRESSION  1. No acute process or evidence of metastatic disease in the chest. 2. Right upper lung bronchiectasis, consolidation, and architectural distortion are most likely post infectious or inflammatory and not significantly changed. 3. Aortic Atherosclerosis (ICD10-I70.0).  CT ABDOMEN AND PELVIS IMPRESSION  1. Response to therapy of nodal and peritoneal metastasis. 2. No new or progressive disease. 3. Caudate lobe hemangioma, as before. 4.  Bilateral renal cortical thinning. Similar minimal right-sided caliectasis and hydroureter. Likely secondary to low-grade obstruction by the dominant right pelvic implant.      09/22/2017 Tumor Marker    Patient's tumor was tested for the following markers: CA-125 Results of the tumor marker test revealed 18      11/03/2017 Tumor Marker    Patient's tumor was tested for the following markers: CA-125 Results of the tumor marker test revealed 13.4       INTERVAL HISTORY: Please see below for problem oriented charting. She returns for further follow-up She tolerated last dose of chemotherapy well Denies significant worsening peripheral neuropathy No recent nausea vomiting No recent infection  REVIEW OF SYSTEMS:   Constitutional: Denies fevers, chills or abnormal weight loss Eyes: Denies blurriness of vision Ears, nose, mouth, throat, and face: Denies mucositis or sore throat Respiratory: Denies cough, dyspnea or wheezes Cardiovascular: Denies palpitation, chest discomfort or lower extremity swelling Gastrointestinal:  Denies nausea, heartburn or change in bowel habits Skin: Denies abnormal skin rashes Lymphatics: Denies new lymphadenopathy or easy bruising Neurological:Denies numbness, tingling or new weaknesses Behavioral/Psych: Mood is stable, no new changes  All other systems were reviewed with the patient and are negative.  I have reviewed the past medical history, past surgical history, social history and family history with the patient and they are unchanged from previous note.  ALLERGIES:  is allergic to codeine and latex.  MEDICATIONS:  Current Outpatient Medications  Medication Sig Dispense Refill  . apixaban (ELIQUIS) 5 MG TABS tablet Take 1 tablet (5 mg total) by mouth 2 (two) times daily. 60 tablet 5  . aspirin EC 81 MG tablet Take 81 mg by mouth daily.    Marland Kitchen atorvastatin (LIPITOR) 40 MG tablet Take 40 mg by mouth at bedtime.    Marland Kitchen dexamethasone (DECADRON) 4 MG  tablet Take 5 tabs at 10 pm the night before chemo and 5 tabs at 6 am the day of chemo, with food, by mouth 60 tablet 1  . gabapentin (NEURONTIN) 300 MG capsule Take 1 capsule (300 mg total) 3 (three) times daily by mouth. 90 capsule 3  . lidocaine-prilocaine (EMLA) cream Apply to Porta-Cath 1-2 hours prior to access as directed. 30 g 1  . loratadine (CLARITIN) 10 MG tablet Take 10 mg by mouth daily as needed (Recommended for chemo pain). Reported on 03/16/2016    . mirtazapine (REMERON) 15 MG tablet Take 15 mg by mouth at bedtime.    Alanda Slim Tosylate (ZEJULA) 100 MG CAPS Take 300 mg by mouth daily. 90 capsule 11  . omega-3 acid ethyl esters (LOVAZA) 1 g capsule Take 1 g by mouth daily.    . ondansetron (ZOFRAN) 8 MG tablet Take 1 every 8 hours/as needed for nausea, start after 3rd day of chemo 30 tablet 1  . oxyCODONE (OXY IR/ROXICODONE) 5 MG immediate release tablet Take 1 tablet (5 mg total) by mouth every 4 (four) hours as needed for severe pain. 60 tablet 0  . PARoxetine (PAXIL) 20 MG tablet TAKE 1 TABLET BY MOUTH DAILY (Patient  taking differently: TAKE 20 MG BY MOUTH DAILY) 90 tablet 0  . prochlorperazine (COMPAZINE) 10 MG tablet Take 1 tablet (10 mg total) by mouth every 6 (six) hours as needed (Nausea or vomiting). 30 tablet 1  . verapamil (CALAN-SR) 240 MG CR tablet Take 1 tablet (240 mg total) by mouth daily. 30 tablet 0   No current facility-administered medications for this visit.     PHYSICAL EXAMINATION: ECOG PERFORMANCE STATUS: 1 - Symptomatic but completely ambulatory  Vitals:   11/03/17 1043  BP: (!) 143/81  Pulse: (!) 101  Resp: 18  Temp: 98.9 F (37.2 C)  SpO2: 98%   Filed Weights   11/03/17 1043  Weight: 224 lb 12.8 oz (102 kg)    GENERAL:alert, no distress and comfortable SKIN: skin color, texture, turgor are normal, no rashes or significant lesions EYES: normal, Conjunctiva are pink and non-injected, sclera clear OROPHARYNX:no exudate, no erythema and  lips, buccal mucosa, and tongue normal  NECK: supple, thyroid normal size, non-tender, without nodularity LYMPH:  no palpable lymphadenopathy in the cervical, axillary or inguinal LUNGS: clear to auscultation and percussion with normal breathing effort HEART: regular rate & rhythm and no murmurs and no lower extremity edema ABDOMEN:abdomen soft, non-tender and normal bowel sounds Musculoskeletal:no cyanosis of digits and no clubbing  NEURO: alert & oriented x 3 with dysarthria and persistent weakness from prior stroke  LABORATORY DATA:  I have reviewed the data as listed    Component Value Date/Time   NA 142 11/03/2017 0955   K 3.7 11/03/2017 0955   CL 105 06/14/2017 1430   CO2 22 11/03/2017 0955   GLUCOSE 144 (H) 11/03/2017 0955   BUN 16.4 11/03/2017 0955   CREATININE 0.8 11/03/2017 0955   CALCIUM 10.0 11/03/2017 0955   PROT 7.3 11/03/2017 0955   ALBUMIN 3.6 11/03/2017 0955   AST 17 11/03/2017 0955   ALT 22 11/03/2017 0955   ALKPHOS 113 11/03/2017 0955   BILITOT 0.50 11/03/2017 0955   GFRNONAA 68 06/14/2017 1430   GFRAA 79 06/14/2017 1430    No results found for: SPEP, UPEP  Lab Results  Component Value Date   WBC 8.7 11/03/2017   NEUTROABS 7.9 (H) 11/03/2017   HGB 11.6 11/03/2017   HCT 34.8 11/03/2017   MCV 111.9 (H) 11/03/2017   PLT 231 11/03/2017      Chemistry      Component Value Date/Time   NA 142 11/03/2017 0955   K 3.7 11/03/2017 0955   CL 105 06/14/2017 1430   CO2 22 11/03/2017 0955   BUN 16.4 11/03/2017 0955   CREATININE 0.8 11/03/2017 0955      Component Value Date/Time   CALCIUM 10.0 11/03/2017 0955   ALKPHOS 113 11/03/2017 0955   AST 17 11/03/2017 0955   ALT 22 11/03/2017 0955   BILITOT 0.50 11/03/2017 0955       ASSESSMENT & PLAN:  Ovarian cancer ([place]) She had excellent response to treatment However, CT scans to showed significant residual disease I recommend we proceed with cycle 6 of treatment I I plan to repeat another CT scan and  consider PARP inhibitor in the future as maintenance therapy I will get assistance from my pharmacist to obtain insurance prior authorization and copayment assistance I do not plan to start her on Niraparib until results of CT is available  Chemotherapy-induced peripheral neuropathy ([place]) She denies significant peripheral neuropathy from prior treatment She will continue gabapentin  Drug-induced hyperglycemia With recent reduction on premedication of dexamethasone, her  blood sugar is better We will continue to monitor her blood sugar closely while on treatment  Chronic atrial fibrillation ([place]) She has chronic atrial fibrillation, rate control She is on chronic anticoagulation therapy For this reason, Avastin is not a good choice for maintenance therapy   Orders Placed This Encounter  Procedures  . CT ABDOMEN PELVIS W CONTRAST    Standing Status:   Future    Standing Expiration Date:   11/03/2018    Order Specific Question:   If indicated for the ordered procedure, I authorize the administration of contrast media per Radiology protocol    Answer:   Yes    Order Specific Question:   Preferred imaging location?    Answer:   [facility]    Order Specific Question:   Radiology Contrast Protocol - do NOT remove file path    Answer:   [email or web address]   All questions were answered. The patient knows to call the clinic with any problems, questions or concerns. No barriers to learning was detected. I spent 25 minutes counseling the patient face to face. The total time spent in the appointment was 30 minutes and more than 50% was on counseling and review of test results     Heath Lark, MD 11/05/2017 4:33 PM

## 2017-11-05 NOTE — Assessment & Plan Note (Addendum)
She has chronic atrial fibrillation, rate control She is on chronic anticoagulation therapy For this reason, Avastin is not a good choice for maintenance therapy

## 2017-11-08 ENCOUNTER — Telehealth: Payer: Self-pay | Admitting: Pharmacist

## 2017-11-08 DIAGNOSIS — C561 Malignant neoplasm of right ovary: Secondary | ICD-10-CM

## 2017-11-08 DIAGNOSIS — C562 Malignant neoplasm of left ovary: Principal | ICD-10-CM

## 2017-11-08 DIAGNOSIS — C563 Malignant neoplasm of bilateral ovaries: Secondary | ICD-10-CM

## 2017-11-08 MED ORDER — NIRAPARIB TOSYLATE 100 MG PO CAPS: 300.0000 mg | ORAL_CAPSULE | Freq: Every day | ORAL | 11 refills | Status: DC

## 2017-11-08 NOTE — Telephone Encounter (Signed)
Oral Oncology Pharmacist Encounter  Received new prescription for Zejula (niraparib) for the treatment of recurrent ovarian cancer with response to platinum based chemotherapy, planned duration until disease progression or unacceptable toxicity.  Labs from 11/03/17 assessed, OK for treatment. Dose 300mg  daily, dose appropriate (wt=102kg, pltc=231k)  Current medication list in Epic reviewed, no DDIs with Zejula identified.  Prescription has been e-scribed to the [facility] for benefits analysis and approval. Noted MD plans to wait until after CT scan prior to starting Zejula. Scan not yet scheduled. Next office visit 11/25/17.  Oral Oncology Clinic will continue to follow for insurance authorization, copayment issues, initial counseling and start date.  Johny Drilling, PharmD, BCPS, BCOP 11/08/2017 12:58 PM Oral Oncology Clinic [phone]

## 2017-11-24 ENCOUNTER — Other Ambulatory Visit (HOSPITAL_BASED_OUTPATIENT_CLINIC_OR_DEPARTMENT_OTHER): Payer: Medicare Other

## 2017-11-24 ENCOUNTER — Ambulatory Visit: Payer: Medicare Other

## 2017-11-24 ENCOUNTER — Ambulatory Visit (HOSPITAL_COMMUNITY)
Admission: RE | Admit: 2017-11-24 | Discharge: 2017-11-24 | Disposition: A | Discharge status: Home / Self Care | Payer: Medicare Other | Source: Ambulatory Visit | Attending: Hematology and Oncology | Admitting: Hematology and Oncology

## 2017-11-24 DIAGNOSIS — C563 Malignant neoplasm of bilateral ovaries: Secondary | ICD-10-CM

## 2017-11-24 DIAGNOSIS — Z95828 Presence of other vascular implants and grafts: Secondary | ICD-10-CM

## 2017-11-24 DIAGNOSIS — C562 Malignant neoplasm of left ovary: Secondary | ICD-10-CM | POA: Diagnosis not present

## 2017-11-24 DIAGNOSIS — C561 Malignant neoplasm of right ovary: Secondary | ICD-10-CM

## 2017-11-24 DIAGNOSIS — C800 Disseminated malignant neoplasm, unspecified: Secondary | ICD-10-CM

## 2017-11-24 LAB — COMPREHENSIVE METABOLIC PANEL
ALT: 17 U/L (ref 0–55)
AST: 18 U/L (ref 5–34)
Albumin: 3.6 g/dL (ref 3.5–5.0)
Alkaline Phosphatase: 124 U/L (ref 40–150)
Anion Gap: 11 mEq/L (ref 3–11)
BUN: 11.9 mg/dL (ref 7.0–26.0)
CO2: 25 mEq/L (ref 22–29)
Calcium: 9.1 mg/dL (ref 8.4–10.4)
Chloride: 107 mEq/L (ref 98–109)
Creatinine: 0.8 mg/dL (ref 0.6–1.1)
EGFR: >60 mL/min/{1.73_m2} (ref >60)
Glucose: 84 mg/dl (ref 70–140)
Potassium: 3.7 mEq/L (ref 3.5–5.1)
Sodium: 143 mEq/L (ref 136–145)
Total Bilirubin: 0.48 mg/dL (ref 0.20–1.20)
Total Protein: 7.1 g/dL (ref 6.4–8.3)

## 2017-11-24 LAB — CBC WITH DIFFERENTIAL/PLATELET
BASO%: 0.4 % (ref 0.0–2.0)
Basophils Absolute: 0 10*3/uL (ref 0.0–0.1)
EOS%: 0.2 % (ref 0.0–7.0)
Eosinophils Absolute: 0 10*3/uL (ref 0.0–0.5)
HCT: 34.6 % — ABNORMAL LOW (ref 34.8–46.6)
HGB: 11.4 g/dL — ABNORMAL LOW (ref 11.6–15.9)
LYMPH%: 28.2 % (ref 14.0–49.7)
MCH: 37.5 pg — ABNORMAL HIGH (ref 25.1–34.0)
MCHC: 33.1 g/dL (ref 31.5–36.0)
MCV: 113.3 fL — ABNORMAL HIGH (ref 79.5–101.0)
MONO#: 1.1 10*3/uL — ABNORMAL HIGH (ref 0.1–0.9)
MONO%: 13.2 % (ref 0.0–14.0)
NEUT#: 5 10*3/uL (ref 1.5–6.5)
NEUT%: 58 % (ref 38.4–76.8)
Platelets: 196 10*3/uL (ref 145–400)
RBC: 3.05 10*6/uL — ABNORMAL LOW (ref 3.70–5.45)
RDW: 17.6 % — ABNORMAL HIGH (ref 11.2–14.5)
WBC: 8.6 10*3/uL (ref 3.9–10.3)
lymph#: 2.4 10*3/uL (ref 0.9–3.3)

## 2017-11-24 MED ORDER — IOPAMIDOL (ISOVUE-300) INJECTION 61%
100.0000 mL | Freq: Once | INTRAVENOUS | Status: AC | PRN
  Administered 2017-11-24: 100 mL via INTRAVENOUS

## 2017-11-24 MED ORDER — SODIUM CHLORIDE 0.9% FLUSH
10.0000 mL | Freq: Once | INTRAVENOUS | Status: AC
  Administered 2017-11-24: 10 mL
  Filled 2017-11-24: qty 10

## 2017-11-24 MED ORDER — HEPARIN SOD (PORK) LOCK FLUSH 100 UNIT/ML IV SOLN
250.0000 [IU] | Freq: Once | INTRAVENOUS | Status: AC
  Administered 2017-11-24: 250 [IU]
  Filled 2017-11-24: qty 5

## 2017-11-24 MED ORDER — HEPARIN SOD (PORK) LOCK FLUSH 100 UNIT/ML IV SOLN
INTRAVENOUS | Status: AC
  Filled 2017-11-24: qty 5

## 2017-11-24 MED ORDER — IOPAMIDOL (ISOVUE-300) INJECTION 61%
INTRAVENOUS | Status: AC
  Filled 2017-11-24: qty 100

## 2017-11-24 MED ORDER — HEPARIN SOD (PORK) LOCK FLUSH 100 UNIT/ML IV SOLN
500.0000 [IU] | Freq: Once | INTRAVENOUS | Status: AC
  Administered 2017-11-24: 500 [IU] via INTRAVENOUS

## 2017-11-25 ENCOUNTER — Encounter: Payer: Self-pay | Admitting: Hematology and Oncology

## 2017-11-25 ENCOUNTER — Telehealth: Payer: Self-pay | Admitting: Pharmacist

## 2017-11-25 ENCOUNTER — Ambulatory Visit (HOSPITAL_BASED_OUTPATIENT_CLINIC_OR_DEPARTMENT_OTHER): Payer: Medicare Other | Admitting: Hematology and Oncology

## 2017-11-25 DIAGNOSIS — I482 Chronic atrial fibrillation, unspecified: Secondary | ICD-10-CM

## 2017-11-25 DIAGNOSIS — C562 Malignant neoplasm of left ovary: Secondary | ICD-10-CM

## 2017-11-25 DIAGNOSIS — D638 Anemia in other chronic diseases classified elsewhere: Secondary | ICD-10-CM | POA: Insufficient documentation

## 2017-11-25 LAB — CA 125: Cancer Antigen (CA) 125: 11.6 U/mL (ref 0.0–38.1)

## 2017-11-25 MED FILL — ZEJULA 100 MG CAPS: 100 | 30 days supply | Qty: 90 | Fill #0

## 2017-11-25 NOTE — Telephone Encounter (Signed)
Oral Chemotherapy Pharmacist Encounter   I spoke with patient's daughter, Brenda Cunningham, today for overview of: Zejula.   Counseled on administration, dosing, side effects, monitoring, drug-food interactions, safe handling, storage, and disposal.  Patient will take Zejula 100mg  capsules, 3 capsules (300mg ) by mouth once daily, with a glass of water, without regard to food. Zejula start date: after the holidays Brenda Cunningham will alert the office of start date.   Side effects include but not limited to: nausea, vomiting, diarrhea, taste changes, mouth sores, fatigue, constipation, decreased blood counts, and joint pain. Brenda Cunningham has anti-nausea medications at home in case nausea develops.  Reviewed with patient importance of keeping a medication schedule and plan for any missed doses.  Brenda Cunningham voiced understanding and appreciation.   All questions answered. Medication reconciliation performed and medication/allergy list updated.  Brenda Cunningham will pick up Zejula at the [facility] on Wednesday 12/01/17 for copayment $8.10.   Brenda Cunningham knows to call the office with questions or concerns. Oral Oncology Clinic will continue to follow.  Thank you,  Johny Drilling, PharmD, BCPS, BCOP 11/25/2017   3:55 PM Oral Oncology Clinic [phone]

## 2017-11-25 NOTE — Assessment & Plan Note (Signed)
She has chronic atrial fibrillation, rate control She is on chronic anticoagulation therapy For this reason, Avastin is not a good choice for maintenance therapy

## 2017-11-25 NOTE — Assessment & Plan Note (Signed)
The patient has excellent response to treatment on imaging study There is residual pelvic lymphadenopathy, cause unknown.  Tumor marker is within normal limits We discussed the risks, benefits, side effects of niraparib. I have reviewed guidelines with the patient The goal of treatment is for maintenance treatment The following recommendation is based on NCCN guidelines from publication in [employer].  Niraparib Maintenance Therapy in Platinum-Sensitive, Recurrent Ovarian Cancer M.R. Irineo Axon, Kevan Ny, J. Herrstedt, A.M. Rosey Bath, Lamar Blinks, [place]. Devin Going. Andre Lefort, D. Lorusso, ISheria Lang, N.E. [place], C. Marth, R. M?dry, R.D. Lennox Laity, J.S. Berek, A. Dorum, A.V. Tinker, A. du Sunshine, A. Gonzalez-Martin, P. Follana, B. Benigno, P. Lutricia Feil, L. Rosanna Randy, B.J. Rimel, Mickle Mallory, J.P. Solon Palm, and U.A. Matulonis, for the [employer]OV16/[employer] Investigators [employer] [phone]. DOI: 10.1056/[id number]  Niraparib is an oral poly(adenosine diphosphate [ADP]-ribose) polymerase (PARP) 1/2 inhibitor that has shown clinical activity in patients with ovarian cancer. We sought to evaluate the efficacy of niraparib versus placebo as maintenance treatment for patients with platinum-sensitive, recurrent ovarian cancer. Methods In this randomized, double-blind, phase 3 trial, patients were categorized according to the presence or absence of a germline BRCA mutation (gBRCA cohort and non-gBRCA cohort) and the type of non-gBRCA mutation and were randomly assigned in a 2:1 ratio to receive niraparib (300 mg) or placebo once daily. The primary end point was progression-free survival. Results Of 553 enrolled patients, 203 were in the [facility] cohort (with 138 assigned to niraparib and 65 to placebo), and 350 patients were in the non-gBRCA cohort (with 234 assigned to niraparib and 116 to placebo). Patients in the niraparib group had a significantly longer median  duration of progression-free survival than did those in the placebo group, including 21.0 vs. 5.5 months in the [facility] cohort (hazard ratio, 0.27; 95% confidence interval [CI], 0.17 to 0.41), as compared with 12.9 months vs. 3.8 months in the non-gBRCA cohort for patients who had tumors with homologous recombination deficiency (HRD) (hazard ratio, 0.38; 95% CI, 0.24 to 0.59) and 9.3 months vs. 3.9 months in the overall non-gBRCA cohort (hazard ratio, 0.45; 95% CI, 0.34 to 0.61; P<0.001 for all three comparisons). The most common grade 3 or 4 adverse events that were reported in the niraparib group were thrombocytopenia (in 33.8%), anemia (in 25.3%), and neutropenia (in 19.6%), which were managed with dose modifications. Conclusions Among patients with platinum-sensitive, recurrent ovarian cancer, the median duration of progression-free survival was significantly longer among those receiving niraparib than among those receiving placebo, regardless of the presence or absence of gBRCA mutations or HRD status, with moderate bone marrow toxicity. (Funded by [employer].gov number, [id number].)  The risks, benefits, side effects of treatment were fully discussed with the patient and she agreed to proceed  She agreed with the plan of care to proceed with niraparib I will get assistance from the pharmacist to get medication delivered Assuming that she can afford the medication, I plan to start treatment around December 01, 2017 She would need to come back weekly for blood draw and I plan to see her within 2 weeks of start date of treatment for toxicity review

## 2017-11-25 NOTE — Progress Notes (Signed)
[place] OFFICE PROGRESS NOTE  Patient Care Team: Helane Rima, MD as PCP - General (Family Medicine) Encarnacion Slates, MD as Referring Physician (Neurology)  SUMMARY OF ONCOLOGIC HISTORY: Oncology History   Negative genetic testing     Ovarian cancer [facility])   11/28/2015 Imaging    Ct scan of abdomen: Peritoneal carcinomatosis and pelvic/inguinal adenopathy. Gynecologic primary is favored.      12/17/2015 Pathology Results    Lymph node, needle/core biopsy, L inguinal LAN - METASTATIC ADENOCARCINOMA. Microscopic Comment Immunohistochemistry will be performed and reported as an addendum. (JDP:ecj 12/18/2015) ADDENDUM: Immunohistochemistry shows strong positivity with cytokeratin 7, estrogen receptor (ER), progesterone receptor (PR), p53 and WT1. Negative markers are cytokeratin 20 , CDX- 2 and gross cystic disease fluid protein. 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 (JDP:kh 12-19-15      12/17/2015 Procedure    Technically successful ultrasound guided core left inguinal adenopathy biopsy      12/24/2015 Tumor Marker    Patient's tumor was tested for the following markers: CA-125 Results of the tumor marker test revealed 608.2      12/27/2015 - 02/28/2016 Chemotherapy    She received neoadjuvant chemo x 3 cycles      01/01/2016 Tumor Marker    Patient's tumor was tested for the following markers: CA-125 Results of the tumor marker test revealed 741.6      01/29/2016 Procedure    Successful placement of a right internal jugular approach power injectable Port-A-Cath. The catheter is ready for immediate use      01/29/2016 Imaging    US abdomen 1. Hyperechoic 2.5 x 1.0 x 1.6 cm lesion in the region the caudate lobe of the liver. Similar finding noted on prior CT of 11/28/2015. This could represent hemangioma. This could represent a malignancy including metastatic  disease. 2. Exam otherwise unremarkable. No gallstones. No biliary distention.      02/03/2016 Tumor Marker    Patient's tumor was tested for the following markers: CA-125 Results of the tumor marker test revealed 137.1      02/20/2016 Imaging    MRI brain No acute infarct.  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  No intracranial mass.  Moderate small vessel disease changes.  Global atrophy without hydrocephalus.  Expanded partially empty sella without secondary findings of pseudotumor cerebri.  Minimal mucosal thickening ethmoid sinus air cells. Small air-fluid levels maxillary sinuses bilaterally may indicate changes of acute sinusitis.       02/28/2016 Tumor Marker    Patient's tumor was tested for the following markers: CA-125 Results of the tumor marker test revealed 33.6      03/02/2016 Imaging    CT chest, abdomen and pelvis 1. Today's study demonstrates a positive response to therapy with resolution of the previously noted malignant ascites, significant regression of previously noted peritoneal implants, and regression of previously noted lymphadenopathy in the abdomen and pelvis. 2. No definite evidence to suggest metastatic disease to the thorax. 3. Stable 1.0 x 1.7 cm intermediate attenuation lesion associated with the posterior aspect of segment 1 of the liver, favored to represent a mildly proteinaceous hepatic cyst. The possibility of a peritoneal implant in this region is not entirely excluded, but is not favored on today's examination. 4. Extensive post infectious scarring inguinal upper right lung, likely sequela of prior necrotizing pneumonia. 5. Multiple tiny pulmonary nodules scattered throughout the lungs bilaterally all measuring 4 mm or  less. These are nonspecific, but favored to be benign. Given the patient's history of primary gynecologic malignancy, attention on followup studies is recommended to ensure the stability of these  findings. 6. Additional incidental findings, as above      03/14/2016 Imaging    CT angiogram 1. No pulmonary emboli. 2. Chronic changes to the right upper lobe. 3. Stable lesion in the caudate lobe of the liver. 4. Stable soft tissue prominence to the right of the trachea as described above. 5. Stable small nodule in the right lung.       03/24/2016 Pathology Results    1. Omentum, resection for tumor - FOCAL ADENOCARCINOMA ASSOCIATED WITH EXTENSIVE FIBROSIS, INFLAMMATION AND HEMOSIDERIN DEPOSITION. 2. Uterus +/- tubes/ovaries, neoplastic, cervix - CERVIX: SLIGHT CERVICITIS AND SQUAMOUS METAPLASIA. - ENDOMETRIUM: ATROPHIC, NO HYPERPLASIA OR MALIGNANCY. - MYOMETRIUM: LEIOMYOMATA WITH DEGENERATIVE CHANGES. NO MALIGNANCY. - UTERINE SEROSA: FOCAL ADENOCARCINOMA ASSOCIATED WITH ADHESIONS. - RIGHT OVARY: BENIGN CALCIFIED NODULE AND BENIGN SEROUS CYST. NO MALIGNANCY IDENTIFIED. - RIGHT FALLOPIAN TUBE: UNREMARKABLE. NO MALIGNANCY IDENTIFIED. - LEFT OVARY: FOCAL ADENOCARCINOMA ASSOCIATED WITH EXTENSIVE FIBROSIS. - LEFT FALLOPIAN TUBE: HYDROSALPINX. NO MALIGNANCY IDENTIFIED. 3. Lymph node, biopsy, right external iliac - METASTATIC ADENOCARCINOMA, 4. Soft tissue, biopsy, left para colic gutter - FOCAL ADENOCARCINOMA ASSOCIATED WITH FIBROSIS. Microscopic Comment 2. OVARY Specimen(s): Uterus with bilateral ovaries, omentum, right external iliac lymph node and left paracolic gutter. Procedure: (including lymph node sampling) Hysterectomy with bilateral salpingo-oophorectomy, omentectomy, lymph node biopsy and paracolic gutter biopsy. Primary tumor site (including laterality): Left ovary. Ovarian surface involvement: Yes Ovarian capsule intact without fragmentation: N/A Maximum tumor size (cm): 2 cm, 2 cm Histologic type: Serous carcinoma. Grade: 3 Peritoneal implants: (specify invasive or non-invasive): Left paracolic gutter positive for carcinoma Pelvic extension (list additional  structures on separate lines and if involved): Omentum, right paracolic gutter and uterine serosa. Lymph nodes: number examined 1 ; number positive 1 TNM code: ypT3a, ypN1b, ypMX FIGO Stage (based on pathologic findings, needs clinical correlation): IIIA2 Comments: The left ovary has a 2 cm nodule, which is composed primarily of fibrous tissue with small foci of residual adenocarcinoma. There is also microscopic involvement of the uterine serosa, the left paracolic gutter biopsy and the omentum. The left paracolic gutter and omental involvement is microscopic and both are associated with extensive fibrosis with focal hemosiderin deposition. The right external iliac node is completely replaced by metastatic adenocarcinoma with serous features. (JDP:ecj 03/30/2016)      03/24/2016 Surgery    Operation: Robotic-assisted laparoscopic total hysterectomy with bilateral salpingoophorectomy, omentectomy, radical tumor debulking  Surgeon: Donaciano Eva  Operative Findings:  : small nodules in omentum, no ascites. Omental nodule (2cm) adherent to lower anterior abdominal wall. 2cm left colonic gutter cystic nodule. 6cm right external iliac lymph node. Grossly normal ovaries and tubes. Fibroid uterus. No gross residual disease at completion of surgery representing R0 optimal/complete resection.       04/17/2016 Imaging    CT abdomen and pelvis 1. There is no evidence of acute inflammatory process within abdomen or pelvis. 2. Stable low-density lesion within caudate lobe of the liver. No new hepatic lesions are noted. 3. Again noted lobulated renal contour and multifocal renal cortical scarring. No hydronephrosis or hydroureter. 4. No significant mesenteric adenopathy. No retroperitoneal adenopathy. Stable minimal residual peritoneal thickening within pelvis. No new pelvic implants or pelvic ascites. 5. Status post hysterectomy.  Unremarkable urinary bladder. 6. Moderate stool within colon as  described above. No evidence of colitis.  04/24/2016 - 06/19/2016 Chemotherapy    She received 3 more cycles of chemo after surgery      05/14/2016 Tumor Marker    Patient's tumor was tested for the following markers: CA-125 Results of the tumor marker test revealed 22.5      06/04/2016 Tumor Marker    Patient's tumor was tested for the following markers: CA-125 Results of the tumor marker test revealed 13.7      07/07/2016 Genetic Testing    Patient has genetic testing done for breast/ovasrian cancer panel Results revealed patient has no actionable mutation      07/23/2016 Imaging    Ct abdomen and pelvis 1. Heterogeneously enhancing 4.9 x 3.2 x 4.4 cm mass along the right pelvic sidewall may represent locally recurrent disease or malignant lymphadenopathy. 2. No other signs of definite metastatic disease noted elsewhere in the abdomen or pelvis. 3. Stable low-attenuation hepatic lesion in the caudate lobe of the liver, which appears to demonstrates some progressive centripetal filling on delayed images. This lesion is presumably benign given its stability compared to prior studies, and is favored to represent a small cavernous hemangioma. 4. Cardiomegaly with biatrial dilatation, which is very severe on the right side. 5. Aortic atherosclerosis. 6. Additional incidental findings, as above.      07/23/2016 Tumor Marker    Patient's tumor was tested for the following markers: CA-125 Results of the tumor marker test revealed 12.3      09/30/2016 Imaging    Ct abdomen and pelvis: 1. 4.9 cm heterogeneously enhancing mass seen along the right pelvic sidewall previously has almost completely resolved. There is some ill-defined soft tissue attenuation/peritoneal thickening in this region today but no discrete measurable lesion is evident. 2. No new or progressive findings on today's exam. 3. Stable hypo attenuating lesion in the caudate lobe of the liver. 4. Subtle aortic  atherosclerosis      09/30/2016 Tumor Marker    Patient's tumor was tested for the following markers: CA-125 Results of the tumor marker test revealed 9.6      10/05/2016 Pathology Results    Vagina, biopsy, left cuff - BENIGN FIBROEPITHELIAL (STROMAL) POLYP. - NO DYSPLASIA, ATYPIA OR MALIGNANCY IDENTIFIED.      01/14/2017 Tumor Marker    Patient's tumor was tested for the following markers: CA-125 Results of the tumor marker test revealed 11.9      05/05/2017 Tumor Marker    Patient's tumor was tested for the following markers: CA-125 Results of the tumor marker test revealed 28      06/14/2017 Tumor Marker    Patient's tumor was tested for the following markers: CA-125 Results of the tumor marker test revealed 45.7      06/24/2017 Imaging    Ct abdomen and pelvis: Increased peritoneal metastatic disease in abdomen and pelvis since prior exam. No evidence of ascites. Stable small benign hepatic hemangioma.      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 Does of chemo is reduced starting cycle 2 onwards      09/02/2017 Tumor Marker    Patient's tumor was tested for the following markers: CA-125 Results of the tumor marker test revealed 27.7      09/16/2017  Imaging    CT CHEST IMPRESSION  1. No acute process or evidence of metastatic disease in the chest. 2. Right upper lung bronchiectasis, consolidation, and architectural distortion are most likely post infectious or inflammatory and not significantly changed. 3. Aortic Atherosclerosis (ICD10-I70.0).  CT ABDOMEN AND PELVIS IMPRESSION  1. Response to therapy of nodal and peritoneal metastasis. 2. No new or progressive disease. 3. Caudate lobe hemangioma, as before. 4.  Bilateral renal cortical thinning. Similar minimal right-sided caliectasis and hydroureter. Likely secondary to low-grade obstruction by the dominant right pelvic implant.      09/22/2017 Tumor Marker    Patient's tumor was tested for the following markers: CA-125 Results of the tumor marker test revealed 18      11/03/2017 Tumor Marker    Patient's tumor was tested for the following markers: CA-125 Results of the tumor marker test revealed 13.4      11/25/2017 Imaging    1. Stable to slight interval decrease in size of nodal and peritoneal metastatic disease within the abdomen. 2. No new or progressive disease.       INTERVAL HISTORY: Please see below for problem oriented charting. She returns for further follow-up with family She is doing well Denies recent infection No recent fall The patient denies any recent signs or symptoms of bleeding such as spontaneous epistaxis, hematuria or hematochezia. She felt well since discontinuation of chemotherapy  REVIEW OF SYSTEMS:   Constitutional: Denies fevers, chills or abnormal weight loss Eyes: Denies blurriness of vision Ears, nose, mouth, throat, and face: Denies mucositis or sore throat Respiratory: Denies cough, dyspnea or wheezes Cardiovascular: Denies palpitation, chest discomfort or lower extremity swelling Gastrointestinal:  Denies nausea, heartburn or change in bowel habits Skin: Denies abnormal skin rashes Lymphatics: Denies new lymphadenopathy or easy bruising Neurological:Denies numbness, tingling or new weaknesses Behavioral/Psych: Mood is stable, no new changes  All other systems were reviewed with the patient and are negative.  I have reviewed the past medical history, past surgical history, social history and family history with the patient and they are unchanged from previous note.  ALLERGIES:  is allergic to codeine and latex.  MEDICATIONS:  Current Outpatient Medications  Medication Sig Dispense Refill  .  apixaban (ELIQUIS) 5 MG TABS tablet Take 1 tablet (5 mg total) by mouth 2 (two) times daily. 60 tablet 5  . aspirin EC 81 MG tablet Take 81 mg by mouth daily.    Marland Kitchen atorvastatin (LIPITOR) 40 MG tablet Take 40 mg by mouth at bedtime.    Marland Kitchen dexamethasone (DECADRON) 4 MG tablet Take 5 tabs at 10 pm the night before chemo and 5 tabs at 6 am the day of chemo, with food, by mouth 60 tablet 1  . gabapentin (NEURONTIN) 300 MG capsule Take 1 capsule (300 mg total) 3 (three) times daily by mouth. 90 capsule 3  . lidocaine-prilocaine (EMLA) cream Apply to Porta-Cath 1-2 hours prior to access as directed. 30 g 1  . loratadine (CLARITIN) 10 MG tablet Take 10 mg by mouth daily as needed (Recommended for chemo pain). Reported on 03/16/2016    . mirtazapine (REMERON) 15 MG tablet Take 15 mg by mouth at bedtime.    Alanda Slim Tosylate (ZEJULA) 100 MG CAPS Take 300 mg by mouth daily. 90 capsule 11  . omega-3 acid ethyl esters (LOVAZA) 1 g capsule Take 1 g by mouth daily.    . ondansetron (ZOFRAN) 8 MG tablet Take 1 every 8 hours/as needed for nausea, start after 3rd  day of chemo 30 tablet 1  . oxyCODONE (OXY IR/ROXICODONE) 5 MG immediate release tablet Take 1 tablet (5 mg total) by mouth every 4 (four) hours as needed for severe pain. 60 tablet 0  . PARoxetine (PAXIL) 20 MG tablet TAKE 1 TABLET BY MOUTH DAILY (Patient taking differently: TAKE 20 MG BY MOUTH DAILY) 90 tablet 0  . prochlorperazine (COMPAZINE) 10 MG tablet Take 1 tablet (10 mg total) by mouth every 6 (six) hours as needed (Nausea or vomiting). 30 tablet 1  . verapamil (CALAN-SR) 240 MG CR tablet Take 1 tablet (240 mg total) by mouth daily. 30 tablet 0   No current facility-administered medications for this visit.     PHYSICAL EXAMINATION: ECOG PERFORMANCE STATUS: 1 - Symptomatic but completely ambulatory  Vitals:   11/25/17 1109  BP: 126/75  Pulse: 94  Resp: 18  Temp: 99.2 F (37.3 C)  SpO2: 98%   Filed Weights   11/25/17 1109  Weight: 228  lb 8 oz (103.6 kg)    GENERAL:alert, no distress and comfortable SKIN: skin color, texture, turgor are normal, no rashes or significant lesions EYES: normal, Conjunctiva are pink and non-injected, sclera clear ABDOMEN:abdomen soft, non-tender and normal bowel sounds Musculoskeletal:no cyanosis of digits and no clubbing  NEURO: alert & oriented x 3 with mild dysarthria and persistent hemiparesis from prior stroke  LABORATORY DATA:  I have reviewed the data as listed    Component Value Date/Time   NA 143 11/24/2017 1417   K 3.7 11/24/2017 1417   CL 105 06/14/2017 1430   CO2 25 11/24/2017 1417   GLUCOSE 84 11/24/2017 1417   BUN 11.9 11/24/2017 1417   CREATININE 0.8 11/24/2017 1417   CALCIUM 9.1 11/24/2017 1417   PROT 7.1 11/24/2017 1417   ALBUMIN 3.6 11/24/2017 1417   AST 18 11/24/2017 1417   ALT 17 11/24/2017 1417   ALKPHOS 124 11/24/2017 1417   BILITOT 0.48 11/24/2017 1417   GFRNONAA 68 06/14/2017 1430   GFRAA 79 06/14/2017 1430    No results found for: SPEP, UPEP  Lab Results  Component Value Date   WBC 8.6 11/24/2017   NEUTROABS 5.0 11/24/2017   HGB 11.4 (L) 11/24/2017   HCT 34.6 (L) 11/24/2017   MCV 113.3 (H) 11/24/2017   PLT 196 11/24/2017      Chemistry      Component Value Date/Time   NA 143 11/24/2017 1417   K 3.7 11/24/2017 1417   CL 105 06/14/2017 1430   CO2 25 11/24/2017 1417   BUN 11.9 11/24/2017 1417   CREATININE 0.8 11/24/2017 1417      Component Value Date/Time   CALCIUM 9.1 11/24/2017 1417   ALKPHOS 124 11/24/2017 1417   AST 18 11/24/2017 1417   ALT 17 11/24/2017 1417   BILITOT 0.48 11/24/2017 1417       RADIOGRAPHIC STUDIES: I have reviewed imaging study with the patient and family I have personally reviewed the radiological images as listed and agreed with the findings in the report. Ct Abdomen Pelvis W Contrast  Result Date: 11/25/2017 CLINICAL DATA:  Patient with history of ovarian carcinoma. Restaging evaluation. EXAM: CT ABDOMEN  AND PELVIS WITH CONTRAST TECHNIQUE: Multidetector CT imaging of the abdomen and pelvis was performed using the standard protocol following bolus administration of intravenous contrast. CONTRAST:  131m ISOVUE-300 IOPAMIDOL (ISOVUE-300) INJECTION 61% COMPARISON:  CT CAP 09/16/2017. FINDINGS: Lower chest: Heart is mildly enlarged. Dependent atelectasis within the bilateral lower lobes. No pleural effusion. Hepatobiliary: Liver is normal  in size and contour. Gallbladder is unremarkable. No intrahepatic or extrahepatic biliary ductal dilatation. Stable 1.8 cm hemangioma within the caudate lobe (image 14; series 2). Pancreas: Unremarkable Spleen: Remarkable Adrenals/Urinary Tract: Normal adrenal glands. Re- demonstrated bilateral renal cortical scarring. Minimal right pelviectasis, unchanged. Urinary bladder is unremarkable. Stomach/Bowel: No abnormal bowel wall thickening or evidence for bowel obstruction. No free fluid or free intraperitoneal air. Vascular/Lymphatic: Normal caliber abdominal aorta. No retroperitoneal lymphadenopathy. Unchanged 9 mm ileocolic mesenteric lymph node (image 55; series 2), previously 9 mm. Slight interval decrease in size of left external iliac lymph node measuring 8 mm, previously 10 mm (image 76; series 2). Reproductive: Uterus is surgically absent. Other: Low-density implant within the pelvic cul-de-sac superior to the vaginal cuff is no longer visualized. There is persistent thickening of the superior aspect of the vaginal cuff which is nonspecific (image 73; series 2). Ill defined right pelvic soft tissue density implant difficult to measure however approximately 3.0 x 2.0 cm (image 65; series 2), previously 3.2 x 2.1 cm. Interval decrease in size of pericecal nodule measuring 6 mm (image 66; series 2), previously 11 mm. Musculoskeletal: No aggressive or acute appearing osseous lesions. IMPRESSION: 1. Stable to slight interval decrease in size of nodal and peritoneal metastatic disease  within the abdomen. 2. No new or progressive disease. Electronically Signed   By: Lovey Newcomer M.D.   On: 11/25/2017 10:17    ASSESSMENT & PLAN:  Ovarian cancer ([place]) The patient has excellent response to treatment on imaging study There is residual pelvic lymphadenopathy, cause unknown.  Tumor marker is within normal limits We discussed the risks, benefits, side effects of niraparib. I have reviewed guidelines with the patient The goal of treatment is for maintenance treatment The following recommendation is based on NCCN guidelines from publication in [place] of Medicine.  Niraparib Maintenance Therapy in Platinum-Sensitive, Recurrent Ovarian Cancer M.R. Irineo Axon, Kevan Ny, J. Herrstedt, A.M. Rosey Bath, Lamar Blinks, [place]. Devin Going. Andre Lefort, D. Lorusso, ISheria Lang, N.E. [place], C. Marth, R. M?dry, R.D. Lennox Laity, J.S. Berek, A. Dorum, A.V. Tinker, A. du [place], A. Gonzalez-Martin, P. Follana, B. Benigno, P. Lutricia Feil, L. Rosanna Randy, B.J. Rimel, Mickle Mallory, J.P. Solon Palm, and U.A. Matulonis, for the ENGOT-OV16/NOVA Investigators [employer] [phone]. DOI: 10.1056/[id number]  Niraparib is an oral poly(adenosine diphosphate [ADP]-ribose) polymerase (PARP) 1/2 inhibitor that has shown clinical activity in patients with ovarian cancer. We sought to evaluate the efficacy of niraparib versus placebo as maintenance treatment for patients with platinum-sensitive, recurrent ovarian cancer. Methods In this randomized, double-blind, phase 3 trial, patients were categorized according to the presence or absence of a germline BRCA mutation (gBRCA cohort and non-gBRCA cohort) and the type of non-gBRCA mutation and were randomly assigned in a 2:1 ratio to receive niraparib (300 mg) or placebo once daily. The primary end point was progression-free survival. Results Of 553 enrolled patients, 203 were in the [facility] cohort (with 138 assigned to niraparib and 65 to placebo),  and 350 patients were in the non-gBRCA cohort (with 234 assigned to niraparib and 116 to placebo). Patients in the niraparib group had a significantly longer median duration of progression-free survival than did those in the placebo group, including 21.0 vs. 5.5 months in the [facility] cohort (hazard ratio, 0.27; 95% confidence interval [CI], 0.17 to 0.41), as compared with 12.9 months vs. 3.8 months in the non-gBRCA cohort for patients who had tumors with homologous recombination deficiency (HRD) (hazard ratio, 0.38; 95% CI, 0.24 to 0.59) and  9.3 months vs. 3.9 months in the overall non-gBRCA cohort (hazard ratio, 0.45; 95% CI, 0.34 to 0.61; P<0.001 for all three comparisons). The most common grade 3 or 4 adverse events that were reported in the niraparib group were thrombocytopenia (in 33.8%), anemia (in 25.3%), and neutropenia (in 19.6%), which were managed with dose modifications. Conclusions Among patients with platinum-sensitive, recurrent ovarian cancer, the median duration of progression-free survival was significantly longer among those receiving niraparib than among those receiving placebo, regardless of the presence or absence of gBRCA mutations or HRD status, with moderate bone marrow toxicity. (Funded by [employer].gov number, URB56648303.)  The risks, benefits, side effects of treatment were fully discussed with the patient and she agreed to proceed  She agreed with the plan of care to proceed with niraparib I will get assistance from the pharmacist to get medication delivered Assuming that she can afford the medication, I plan to start treatment around December 01, 2017 She would need to come back weekly for blood draw and I plan to see her within 2 weeks of start date of treatment for toxicity review  Chronic atrial fibrillation ([place]) She has chronic atrial fibrillation, rate control She is on chronic anticoagulation therapy For this reason, Avastin is not a good choice for  maintenance therapy  Anemia, chronic disease This is likely anemia of chronic disease. The patient denies recent history of bleeding such as epistaxis, hematuria or hematochezia. She is asymptomatic from the anemia. We will observe for now.    No orders of the defined types were placed in this encounter.  All questions were answered. The patient knows to call the clinic with any problems, questions or concerns. No barriers to learning was detected. I spent 25 minutes counseling the patient face to face. The total time spent in the appointment was 40 minutes and more than 50% was on counseling and review of test results     Heath Lark, MD 11/25/2017 12:29 PM

## 2017-11-25 NOTE — Assessment & Plan Note (Signed)
This is likely anemia of chronic disease. The patient denies recent history of bleeding such as epistaxis, hematuria or hematochezia. She is asymptomatic from the anemia. We will observe for now.  

## 2017-11-26 ENCOUNTER — Telehealth: Payer: Self-pay | Admitting: Hematology and Oncology

## 2017-11-26 NOTE — Telephone Encounter (Signed)
Per 12/20 los - Return for No new orders or return visit.

## 2017-12-01 ENCOUNTER — Telehealth: Payer: Self-pay

## 2017-12-01 ENCOUNTER — Telehealth: Payer: Self-pay | Admitting: Hematology and Oncology

## 2017-12-01 NOTE — Telephone Encounter (Signed)
Spoke to patients daughter regarding upcoming January appointments per 12/26 sch message.

## 2017-12-01 NOTE — Telephone Encounter (Signed)
Daughter called and left message to call her. Called back. Her Mom started Zejulamedication today and needs a lab appt in one week. Scheduling message sent for lab appt for 1/2.

## 2017-12-08 ENCOUNTER — Telehealth: Payer: Self-pay | Admitting: *Deleted

## 2017-12-08 ENCOUNTER — Other Ambulatory Visit (HOSPITAL_BASED_OUTPATIENT_CLINIC_OR_DEPARTMENT_OTHER): Payer: Medicare Other

## 2017-12-08 DIAGNOSIS — C562 Malignant neoplasm of left ovary: Secondary | ICD-10-CM

## 2017-12-08 DIAGNOSIS — C561 Malignant neoplasm of right ovary: Secondary | ICD-10-CM

## 2017-12-08 DIAGNOSIS — C563 Malignant neoplasm of bilateral ovaries: Secondary | ICD-10-CM

## 2017-12-08 DIAGNOSIS — C800 Disseminated malignant neoplasm, unspecified: Secondary | ICD-10-CM

## 2017-12-08 LAB — COMPREHENSIVE METABOLIC PANEL
ALT: 20 U/L (ref 0–55)
AST: 18 U/L (ref 5–34)
Albumin: 3.6 g/dL (ref 3.5–5.0)
Alkaline Phosphatase: 131 U/L (ref 40–150)
Anion Gap: 8 mEq/L (ref 3–11)
BUN: 16.2 mg/dL (ref 7.0–26.0)
CO2: 28 mEq/L (ref 22–29)
Calcium: 9.5 mg/dL (ref 8.4–10.4)
Chloride: 106 mEq/L (ref 98–109)
Creatinine: 0.9 mg/dL (ref 0.6–1.1)
EGFR: >60 mL/min/{1.73_m2} (ref >60)
Glucose: 97 mg/dl (ref 70–140)
Potassium: 4.4 mEq/L (ref 3.5–5.1)
Sodium: 142 mEq/L (ref 136–145)
Total Bilirubin: 0.7 mg/dL (ref 0.20–1.20)
Total Protein: 6.9 g/dL (ref 6.4–8.3)

## 2017-12-08 LAB — CBC WITH DIFFERENTIAL/PLATELET
BASO%: 0.3 % (ref 0.0–2.0)
Basophils Absolute: 0 10*3/uL (ref 0.0–0.1)
EOS%: 1.6 % (ref 0.0–7.0)
Eosinophils Absolute: 0.1 10*3/uL (ref 0.0–0.5)
HCT: 34.9 % (ref 34.8–46.6)
HGB: 11.6 g/dL (ref 11.6–15.9)
LYMPH%: 39.2 % (ref 14.0–49.7)
MCH: 37.8 pg — ABNORMAL HIGH (ref 25.1–34.0)
MCHC: 33.1 g/dL (ref 31.5–36.0)
MCV: 114 fL — ABNORMAL HIGH (ref 79.5–101.0)
MONO#: 0.5 10*3/uL (ref 0.1–0.9)
MONO%: 11.6 % (ref 0.0–14.0)
NEUT#: 1.9 10*3/uL (ref 1.5–6.5)
NEUT%: 47.3 % (ref 38.4–76.8)
Platelets: 203 10*3/uL (ref 145–400)
RBC: 3.06 10*6/uL — ABNORMAL LOW (ref 3.70–5.45)
RDW: 16 % — ABNORMAL HIGH (ref 11.2–14.5)
WBC: 4 10*3/uL (ref 3.9–10.3)
lymph#: 1.6 10*3/uL (ref 0.9–3.3)

## 2017-12-08 NOTE — Telephone Encounter (Signed)
Notified of message below

## 2017-12-08 NOTE — Telephone Encounter (Signed)
-----   Message from Heath Lark, MD sent at 12/08/2017 12:07 PM EST ----- Regarding: labs OK Please let her know labs are ok I placed scheduling msg to see her next week ----- Message ----- From: Interface, Lab In Three Zero One Sent: 12/08/2017  11:01 AM To: Heath Lark, MD

## 2017-12-10 ENCOUNTER — Telehealth: Payer: Self-pay | Admitting: Hematology and Oncology

## 2017-12-10 NOTE — Telephone Encounter (Signed)
Scheduled appt per 1/2 sch message - Patient daughter is aware of appt date and time.

## 2017-12-15 ENCOUNTER — Telehealth: Payer: Self-pay | Admitting: *Deleted

## 2017-12-15 NOTE — Telephone Encounter (Signed)
"  Molinda Bailiff calling about my mother.  She called me at work to let me know she took three chemotherapy pills, slept for an hour, took three more forgetting she'd already taken today's dose.  Is this a problem?  What does she need to do?"  No black box warnings for Zejula per Pharmacist Ginna.  Could see increase in side effects of nausea, hypertension, increased heart rate.  Will notify Provider.  Per Kenney Houseman "a blood pressure cuff is in the home.  She's probably checking her B/P repeatedly."    Call transferred to collaborative.    Routing call information to collaborative nurse and provider for review.  Further patient communication through collaborative nurse.

## 2017-12-15 NOTE — Telephone Encounter (Signed)
Lab/F/U scheduled tomorrow morning.

## 2017-12-15 NOTE — Telephone Encounter (Signed)
Daughter left a message stating patient took 6 of her chemo pills today rather than 3. Pt took pills, took a nap and then took 3 more when she woke up. Forgot that she had already taken them.   Wants to if she should hold tomorrow's dose or what should she do. Has an appt with Dr Alvy Bimler tomorrow

## 2017-12-15 NOTE — Telephone Encounter (Signed)
Hold tomorrow's dose and resume Friday

## 2017-12-15 NOTE — Telephone Encounter (Signed)
Notified of message below. Verbalized understanding 

## 2017-12-16 ENCOUNTER — Inpatient Hospital Stay: Payer: Medicare Other | Attending: Hematology and Oncology | Admitting: Hematology and Oncology

## 2017-12-16 ENCOUNTER — Encounter: Payer: Self-pay | Admitting: Hematology and Oncology

## 2017-12-16 ENCOUNTER — Inpatient Hospital Stay: Payer: Medicare Other

## 2017-12-16 ENCOUNTER — Telehealth: Payer: Self-pay | Admitting: Hematology and Oncology

## 2017-12-16 DIAGNOSIS — Z8673 Personal history of transient ischemic attack (TIA), and cerebral infarction without residual deficits: Secondary | ICD-10-CM | POA: Insufficient documentation

## 2017-12-16 DIAGNOSIS — I509 Heart failure, unspecified: Secondary | ICD-10-CM | POA: Insufficient documentation

## 2017-12-16 DIAGNOSIS — C786 Secondary malignant neoplasm of retroperitoneum and peritoneum: Secondary | ICD-10-CM | POA: Insufficient documentation

## 2017-12-16 DIAGNOSIS — C561 Malignant neoplasm of right ovary: Secondary | ICD-10-CM

## 2017-12-16 DIAGNOSIS — C562 Malignant neoplasm of left ovary: Secondary | ICD-10-CM

## 2017-12-16 DIAGNOSIS — G620 Drug-induced polyneuropathy: Secondary | ICD-10-CM

## 2017-12-16 DIAGNOSIS — Z7901 Long term (current) use of anticoagulants: Secondary | ICD-10-CM | POA: Diagnosis not present

## 2017-12-16 DIAGNOSIS — M549 Dorsalgia, unspecified: Secondary | ICD-10-CM | POA: Insufficient documentation

## 2017-12-16 DIAGNOSIS — D72819 Decreased white blood cell count, unspecified: Secondary | ICD-10-CM | POA: Insufficient documentation

## 2017-12-16 DIAGNOSIS — Z9221 Personal history of antineoplastic chemotherapy: Secondary | ICD-10-CM | POA: Insufficient documentation

## 2017-12-16 DIAGNOSIS — D701 Agranulocytosis secondary to cancer chemotherapy: Secondary | ICD-10-CM

## 2017-12-16 DIAGNOSIS — T451X5A Adverse effect of antineoplastic and immunosuppressive drugs, initial encounter: Secondary | ICD-10-CM

## 2017-12-16 DIAGNOSIS — N2889 Other specified disorders of kidney and ureter: Secondary | ICD-10-CM | POA: Diagnosis not present

## 2017-12-16 DIAGNOSIS — Z7982 Long term (current) use of aspirin: Secondary | ICD-10-CM | POA: Insufficient documentation

## 2017-12-16 DIAGNOSIS — I700 Atherosclerosis of aorta: Secondary | ICD-10-CM | POA: Insufficient documentation

## 2017-12-16 DIAGNOSIS — I482 Chronic atrial fibrillation, unspecified: Secondary | ICD-10-CM

## 2017-12-16 DIAGNOSIS — R918 Other nonspecific abnormal finding of lung field: Secondary | ICD-10-CM | POA: Insufficient documentation

## 2017-12-16 DIAGNOSIS — Z79899 Other long term (current) drug therapy: Secondary | ICD-10-CM | POA: Insufficient documentation

## 2017-12-16 DIAGNOSIS — C800 Disseminated malignant neoplasm, unspecified: Secondary | ICD-10-CM

## 2017-12-16 DIAGNOSIS — C563 Malignant neoplasm of bilateral ovaries: Secondary | ICD-10-CM

## 2017-12-16 LAB — COMPREHENSIVE METABOLIC PANEL
ALT: 22 U/L (ref 0–55)
AST: 20 U/L (ref 5–34)
Albumin: 3.6 g/dL (ref 3.5–5.0)
Alkaline Phosphatase: 155 U/L — ABNORMAL HIGH (ref 40–150)
Anion gap: 8 (ref 3–11)
BUN: 14 mg/dL (ref 7–26)
CO2: 28 mmol/L (ref 22–29)
Calcium: 9.4 mg/dL (ref 8.4–10.4)
Chloride: 104 mmol/L (ref 98–109)
Creatinine, Ser: 0.9 mg/dL (ref 0.60–1.10)
GFR calc Af Amer: >60 mL/min (ref >60)
GFR calc non Af Amer: >60 mL/min (ref >60)
Glucose, Bld: 96 mg/dL (ref 70–140)
Potassium: 3.5 mmol/L (ref 3.3–4.7)
Sodium: 140 mmol/L (ref 136–145)
Total Bilirubin: 0.6 mg/dL (ref 0.2–1.2)
Total Protein: 7.1 g/dL (ref 6.4–8.3)

## 2017-12-16 LAB — CBC WITH DIFFERENTIAL/PLATELET
Basophils Absolute: 0 10*3/uL (ref 0.0–0.1)
Basophils Relative: 0 %
Eosinophils Absolute: 0.1 10*3/uL (ref 0.0–0.5)
Eosinophils Relative: 3 %
HCT: 35.9 % (ref 34.8–46.6)
Hemoglobin: 12 g/dL (ref 11.6–15.9)
Lymphocytes Relative: 46 %
Lymphs Abs: 1.6 10*3/uL (ref 0.9–3.3)
MCH: 37.8 pg — ABNORMAL HIGH (ref 25.1–34.0)
MCHC: 33.3 g/dL (ref 31.5–36.0)
MCV: 113.5 fL — ABNORMAL HIGH (ref 79.5–101.0)
Monocytes Absolute: 0.3 10*3/uL (ref 0.1–0.9)
Monocytes Relative: 10 %
Neutro Abs: 1.5 10*3/uL (ref 1.5–6.5)
Neutrophils Relative %: 41 %
Platelets: 165 10*3/uL (ref 145–400)
RBC: 3.17 MIL/uL — ABNORMAL LOW (ref 3.70–5.45)
RDW: 16 % (ref 11.2–16.1)
WBC: 3.5 10*3/uL — ABNORMAL LOW (ref 3.9–10.3)

## 2017-12-16 MED ORDER — OXYCODONE HCL 5 MG PO TABS: 5.0000 mg | ORAL_TABLET | ORAL | 0 refills | Status: DC | PRN

## 2017-12-16 NOTE — Assessment & Plan Note (Signed)
She has chronic atrial fibrillation, rate control She is on chronic anticoagulation therapy For this reason, Avastin is not a good choice for maintenance therapy

## 2017-12-16 NOTE — Assessment & Plan Note (Addendum)
Neuropathy is stable She denies significant pain She requested prescription refill of oxycodone to take as needed and I have given her a prescription refill She can also take it for intermittent back pain

## 2017-12-16 NOTE — Assessment & Plan Note (Signed)
This is likely due to recent treatment. The patient denies recent history of fevers, cough, chills, diarrhea or dysuria. She is asymptomatic from the leukopenia. I will observe for now.  I will continue the chemotherapy at current dose without dosage adjustment.  If the leukopenia gets progressive worse in the future, I might have to delay her treatment or adjust the chemotherapy dose.   

## 2017-12-16 NOTE — Telephone Encounter (Signed)
Gave patient AVS and calendar of upcoming January & February appointments.

## 2017-12-16 NOTE — Progress Notes (Signed)
[place] OFFICE PROGRESS NOTE  Patient Care Team: Helane Rima, MD as PCP - General (Family Medicine) Encarnacion Slates, MD as Referring Physician (Neurology)  SUMMARY OF ONCOLOGIC HISTORY: Oncology History   Negative genetic testing     Ovarian cancer [facility])   11/28/2015 Imaging    Ct scan of abdomen: Peritoneal carcinomatosis and pelvic/inguinal adenopathy. Gynecologic primary is favored.      12/17/2015 Pathology Results    Lymph node, needle/core biopsy, L inguinal LAN - METASTATIC ADENOCARCINOMA. Microscopic Comment Immunohistochemistry will be performed and reported as an addendum. (JDP:ecj 12/18/2015) ADDENDUM: Immunohistochemistry shows strong positivity with cytokeratin 7, estrogen receptor (ER), progesterone receptor (PR), p53 and WT1. Negative markers are cytokeratin 20 , CDX- 2 and gross cystic disease fluid protein. 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 (JDP:kh 12-19-15      12/17/2015 Procedure    Technically successful ultrasound guided core left inguinal adenopathy biopsy      12/24/2015 Tumor Marker    Patient's tumor was tested for the following markers: CA-125 Results of the tumor marker test revealed 608.2      12/27/2015 - 02/28/2016 Chemotherapy    She received neoadjuvant chemo x 3 cycles      01/01/2016 Tumor Marker    Patient's tumor was tested for the following markers: CA-125 Results of the tumor marker test revealed 741.6      01/29/2016 Procedure    Successful placement of a right internal jugular approach power injectable Port-A-Cath. The catheter is ready for immediate use      01/29/2016 Imaging    US abdomen 1. Hyperechoic 2.5 x 1.0 x 1.6 cm lesion in the region the caudate lobe of the liver. Similar finding noted on prior CT of 11/28/2015. This could represent hemangioma. This could represent a malignancy including metastatic  disease. 2. Exam otherwise unremarkable. No gallstones. No biliary distention.      02/03/2016 Tumor Marker    Patient's tumor was tested for the following markers: CA-125 Results of the tumor marker test revealed 137.1      02/20/2016 Imaging    MRI brain No acute infarct.  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  No intracranial mass.  Moderate small vessel disease changes.  Global atrophy without hydrocephalus.  Expanded partially empty sella without secondary findings of pseudotumor cerebri.  Minimal mucosal thickening ethmoid sinus air cells. Small air-fluid levels maxillary sinuses bilaterally may indicate changes of acute sinusitis.       02/28/2016 Tumor Marker    Patient's tumor was tested for the following markers: CA-125 Results of the tumor marker test revealed 33.6      03/02/2016 Imaging    CT chest, abdomen and pelvis 1. Today's study demonstrates a positive response to therapy with resolution of the previously noted malignant ascites, significant regression of previously noted peritoneal implants, and regression of previously noted lymphadenopathy in the abdomen and pelvis. 2. No definite evidence to suggest metastatic disease to the thorax. 3. Stable 1.0 x 1.7 cm intermediate attenuation lesion associated with the posterior aspect of segment 1 of the liver, favored to represent a mildly proteinaceous hepatic cyst. The possibility of a peritoneal implant in this region is not entirely excluded, but is not favored on today's examination. 4. Extensive post infectious scarring inguinal upper right lung, likely sequela of prior necrotizing pneumonia. 5. Multiple tiny pulmonary nodules scattered throughout the lungs bilaterally all measuring 4 mm or  less. These are nonspecific, but favored to be benign. Given the patient's history of primary gynecologic malignancy, attention on followup studies is recommended to ensure the stability of these  findings. 6. Additional incidental findings, as above      03/14/2016 Imaging    CT angiogram 1. No pulmonary emboli. 2. Chronic changes to the right upper lobe. 3. Stable lesion in the caudate lobe of the liver. 4. Stable soft tissue prominence to the right of the trachea as described above. 5. Stable small nodule in the right lung.       03/24/2016 Pathology Results    1. Omentum, resection for tumor - FOCAL ADENOCARCINOMA ASSOCIATED WITH EXTENSIVE FIBROSIS, INFLAMMATION AND HEMOSIDERIN DEPOSITION. 2. Uterus +/- tubes/ovaries, neoplastic, cervix - CERVIX: SLIGHT CERVICITIS AND SQUAMOUS METAPLASIA. - ENDOMETRIUM: ATROPHIC, NO HYPERPLASIA OR MALIGNANCY. - MYOMETRIUM: LEIOMYOMATA WITH DEGENERATIVE CHANGES. NO MALIGNANCY. - UTERINE SEROSA: FOCAL ADENOCARCINOMA ASSOCIATED WITH ADHESIONS. - RIGHT OVARY: BENIGN CALCIFIED NODULE AND BENIGN SEROUS CYST. NO MALIGNANCY IDENTIFIED. - RIGHT FALLOPIAN TUBE: UNREMARKABLE. NO MALIGNANCY IDENTIFIED. - LEFT OVARY: FOCAL ADENOCARCINOMA ASSOCIATED WITH EXTENSIVE FIBROSIS. - LEFT FALLOPIAN TUBE: HYDROSALPINX. NO MALIGNANCY IDENTIFIED. 3. Lymph node, biopsy, right external iliac - METASTATIC ADENOCARCINOMA, 4. Soft tissue, biopsy, left para colic gutter - FOCAL ADENOCARCINOMA ASSOCIATED WITH FIBROSIS. Microscopic Comment 2. OVARY Specimen(s): Uterus with bilateral ovaries, omentum, right external iliac lymph node and left paracolic gutter. Procedure: (including lymph node sampling) Hysterectomy with bilateral salpingo-oophorectomy, omentectomy, lymph node biopsy and paracolic gutter biopsy. Primary tumor site (including laterality): Left ovary. Ovarian surface involvement: Yes Ovarian capsule intact without fragmentation: N/A Maximum tumor size (cm): 2 cm, 2 cm Histologic type: Serous carcinoma. Grade: 3 Peritoneal implants: (specify invasive or non-invasive): Left paracolic gutter positive for carcinoma Pelvic extension (list additional  structures on separate lines and if involved): Omentum, right paracolic gutter and uterine serosa. Lymph nodes: number examined 1 ; number positive 1 TNM code: ypT3a, ypN1b, ypMX FIGO Stage (based on pathologic findings, needs clinical correlation): IIIA2 Comments: The left ovary has a 2 cm nodule, which is composed primarily of fibrous tissue with small foci of residual adenocarcinoma. There is also microscopic involvement of the uterine serosa, the left paracolic gutter biopsy and the omentum. The left paracolic gutter and omental involvement is microscopic and both are associated with extensive fibrosis with focal hemosiderin deposition. The right external iliac node is completely replaced by metastatic adenocarcinoma with serous features. (JDP:ecj 03/30/2016)      03/24/2016 Surgery    Operation: Robotic-assisted laparoscopic total hysterectomy with bilateral salpingoophorectomy, omentectomy, radical tumor debulking  Surgeon: Donaciano Eva  Operative Findings:  : small nodules in omentum, no ascites. Omental nodule (2cm) adherent to lower anterior abdominal wall. 2cm left colonic gutter cystic nodule. 6cm right external iliac lymph node. Grossly normal ovaries and tubes. Fibroid uterus. No gross residual disease at completion of surgery representing R0 optimal/complete resection.       04/17/2016 Imaging    CT abdomen and pelvis 1. There is no evidence of acute inflammatory process within abdomen or pelvis. 2. Stable low-density lesion within caudate lobe of the liver. No new hepatic lesions are noted. 3. Again noted lobulated renal contour and multifocal renal cortical scarring. No hydronephrosis or hydroureter. 4. No significant mesenteric adenopathy. No retroperitoneal adenopathy. Stable minimal residual peritoneal thickening within pelvis. No new pelvic implants or pelvic ascites. 5. Status post hysterectomy.  Unremarkable urinary bladder. 6. Moderate stool within colon as  described above. No evidence of colitis.  04/24/2016 - 06/19/2016 Chemotherapy    She received 3 more cycles of chemo after surgery      05/14/2016 Tumor Marker    Patient's tumor was tested for the following markers: CA-125 Results of the tumor marker test revealed 22.5      06/04/2016 Tumor Marker    Patient's tumor was tested for the following markers: CA-125 Results of the tumor marker test revealed 13.7      07/07/2016 Genetic Testing    Patient has genetic testing done for breast/ovasrian cancer panel Results revealed patient has no actionable mutation      07/23/2016 Imaging    Ct abdomen and pelvis 1. Heterogeneously enhancing 4.9 x 3.2 x 4.4 cm mass along the right pelvic sidewall may represent locally recurrent disease or malignant lymphadenopathy. 2. No other signs of definite metastatic disease noted elsewhere in the abdomen or pelvis. 3. Stable low-attenuation hepatic lesion in the caudate lobe of the liver, which appears to demonstrates some progressive centripetal filling on delayed images. This lesion is presumably benign given its stability compared to prior studies, and is favored to represent a small cavernous hemangioma. 4. Cardiomegaly with biatrial dilatation, which is very severe on the right side. 5. Aortic atherosclerosis. 6. Additional incidental findings, as above.      07/23/2016 Tumor Marker    Patient's tumor was tested for the following markers: CA-125 Results of the tumor marker test revealed 12.3      09/30/2016 Imaging    Ct abdomen and pelvis: 1. 4.9 cm heterogeneously enhancing mass seen along the right pelvic sidewall previously has almost completely resolved. There is some ill-defined soft tissue attenuation/peritoneal thickening in this region today but no discrete measurable lesion is evident. 2. No new or progressive findings on today's exam. 3. Stable hypo attenuating lesion in the caudate lobe of the liver. 4. Subtle aortic  atherosclerosis      09/30/2016 Tumor Marker    Patient's tumor was tested for the following markers: CA-125 Results of the tumor marker test revealed 9.6      10/05/2016 Pathology Results    Vagina, biopsy, left cuff - BENIGN FIBROEPITHELIAL (STROMAL) POLYP. - NO DYSPLASIA, ATYPIA OR MALIGNANCY IDENTIFIED.      01/14/2017 Tumor Marker    Patient's tumor was tested for the following markers: CA-125 Results of the tumor marker test revealed 11.9      05/05/2017 Tumor Marker    Patient's tumor was tested for the following markers: CA-125 Results of the tumor marker test revealed 28      06/14/2017 Tumor Marker    Patient's tumor was tested for the following markers: CA-125 Results of the tumor marker test revealed 45.7      06/24/2017 Imaging    Ct abdomen and pelvis: Increased peritoneal metastatic disease in abdomen and pelvis since prior exam. No evidence of ascites. Stable small benign hepatic hemangioma.      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 Does of chemo is reduced starting cycle 2 onwards      09/02/2017 Tumor Marker    Patient's tumor was tested for the following markers: CA-125 Results of the tumor marker test revealed 27.7      09/16/2017  Imaging    CT CHEST IMPRESSION  1. No acute process or evidence of metastatic disease in the chest. 2. Right upper lung bronchiectasis, consolidation, and architectural distortion are most likely post infectious or inflammatory and not significantly changed. 3. Aortic Atherosclerosis (ICD10-I70.0).  CT ABDOMEN AND PELVIS IMPRESSION  1. Response to therapy of nodal and peritoneal metastasis. 2. No new or progressive disease. 3. Caudate lobe hemangioma, as before. 4.  Bilateral renal cortical thinning. Similar minimal right-sided caliectasis and hydroureter. Likely secondary to low-grade obstruction by the dominant right pelvic implant.      09/22/2017 Tumor Marker    Patient's tumor was tested for the following markers: CA-125 Results of the tumor marker test revealed 18      11/03/2017 Tumor Marker    Patient's tumor was tested for the following markers: CA-125 Results of the tumor marker test revealed 13.4      11/25/2017 Imaging    1. Stable to slight interval decrease in size of nodal and peritoneal metastatic disease within the abdomen. 2. No new or progressive disease.       INTERVAL HISTORY: Please see below for problem oriented charting. She returns for further follow-up with her daughter She thought she took double doses of chemotherapy pill yesterday After pill count, it appears she has been taking it correctly She dropped 1 pill in the store today Overall, she tolerated treatment well Denies recent infection Denies recent abdominal pain, nausea, bloating or changes in bowel habits No worsening peripheral neuropathy She has intermittent back pain, stable. The patient denies any recent signs or symptoms of bleeding such as spontaneous epistaxis, hematuria or hematochezia.  REVIEW OF SYSTEMS:   Constitutional: Denies fevers, chills or abnormal weight loss Eyes: Denies blurriness of vision Ears, nose, mouth, throat, and face: Denies mucositis or sore throat Respiratory: Denies cough, dyspnea or wheezes Cardiovascular: Denies palpitation, chest discomfort or lower extremity swelling Gastrointestinal:  Denies nausea, heartburn or change in bowel habits Skin: Denies abnormal skin rashes Lymphatics: Denies new lymphadenopathy or easy bruising Neurological:Denies numbness, tingling or new weaknesses Behavioral/Psych: Mood is stable, no new changes  All other systems were reviewed with the patient and are negative.  I have reviewed  the past medical history, past surgical history, social history and family history with the patient and they are unchanged from previous note.  ALLERGIES:  is allergic to codeine and latex.  MEDICATIONS:  Current Outpatient Medications  Medication Sig Dispense Refill  . apixaban (ELIQUIS) 5 MG TABS tablet Take 1 tablet (5 mg total) by mouth 2 (two) times daily. 60 tablet 5  . aspirin EC 81 MG tablet Take 81 mg by mouth daily.    Marland Kitchen atorvastatin (LIPITOR) 40 MG tablet Take 40 mg by mouth at bedtime.    . gabapentin (NEURONTIN) 300 MG capsule Take 1 capsule (300 mg total) 3 (three) times daily by mouth. 90 capsule 3  . lidocaine-prilocaine (EMLA) cream Apply to Porta-Cath 1-2 hours prior to access as directed. 30 g 1  . loratadine (CLARITIN) 10 MG tablet Take 10 mg by mouth daily as needed (Recommended for chemo pain). Reported on 03/16/2016    . mirtazapine (REMERON) 15 MG tablet Take 15 mg by mouth at bedtime.    Alanda Slim Tosylate (ZEJULA) 100 MG CAPS Take 300 mg by mouth daily. 90 capsule 11  . omega-3 acid ethyl esters (LOVAZA) 1 g capsule Take 1 g by mouth daily.    . ondansetron (ZOFRAN) 8 MG tablet Take 1 every  8 hours/as needed for nausea, start after 3rd day of chemo 30 tablet 1  . oxyCODONE (OXY IR/ROXICODONE) 5 MG immediate release tablet Take 1 tablet (5 mg total) by mouth every 4 (four) hours as needed for severe pain. 60 tablet 0  . PARoxetine (PAXIL) 20 MG tablet TAKE 1 TABLET BY MOUTH DAILY (Patient taking differently: TAKE 20 MG BY MOUTH DAILY) 90 tablet 0  . prochlorperazine (COMPAZINE) 10 MG tablet Take 1 tablet (10 mg total) by mouth every 6 (six) hours as needed (Nausea or vomiting). 30 tablet 1  . verapamil (CALAN-SR) 240 MG CR tablet Take 1 tablet (240 mg total) by mouth daily. 30 tablet 0   No current facility-administered medications for this visit.     PHYSICAL EXAMINATION: ECOG PERFORMANCE STATUS: 2 - Symptomatic, <50% confined to bed  Vitals:   12/16/17 1206   BP: 134/85  Pulse: 99  Resp: 18  Temp: 98.5 F (36.9 C)  SpO2: 100%   Filed Weights   12/16/17 1206  Weight: 225 lb 8 oz (102.3 kg)    GENERAL:alert, no distress and comfortable SKIN: skin color, texture, turgor are normal, no rashes or significant lesions EYES: normal, Conjunctiva are pink and non-injected, sclera clear OROPHARYNX:no exudate, no erythema and lips, buccal mucosa, and tongue normal  NECK: supple, thyroid normal size, non-tender, without nodularity LYMPH:  no palpable lymphadenopathy in the cervical, axillary or inguinal LUNGS: clear to auscultation and percussion with normal breathing effort HEART: regular rate & rhythm and no murmurs and no lower extremity edema ABDOMEN:abdomen soft, non-tender and normal bowel sounds Musculoskeletal:no cyanosis of digits and no clubbing  NEURO: alert & oriented x 3 with dysarthria and persistent right-sided deficit  LABORATORY DATA:  I have reviewed the data as listed    Component Value Date/Time   NA 140 12/16/2017 1124   NA 142 12/08/2017 1051   K 3.5 12/16/2017 1124   K 4.4 12/08/2017 1051   CL 104 12/16/2017 1124   CL 105 06/14/2017 1430   CO2 28 12/16/2017 1124   CO2 28 12/08/2017 1051   GLUCOSE 96 12/16/2017 1124   GLUCOSE 97 12/08/2017 1051   BUN 14 12/16/2017 1124   BUN 16.2 12/08/2017 1051   CREATININE 0.90 12/16/2017 1124   CREATININE 0.9 12/08/2017 1051   CALCIUM 9.4 12/16/2017 1124   CALCIUM 9.5 12/08/2017 1051   PROT 7.1 12/16/2017 1124   PROT 6.9 12/08/2017 1051   ALBUMIN 3.6 12/16/2017 1124   ALBUMIN 3.6 12/08/2017 1051   AST 20 12/16/2017 1124   AST 18 12/08/2017 1051   ALT 22 12/16/2017 1124   ALT 20 12/08/2017 1051   ALKPHOS 155 (H) 12/16/2017 1124   ALKPHOS 131 12/08/2017 1051   BILITOT 0.6 12/16/2017 1124   BILITOT 0.70 12/08/2017 1051   GFRNONAA >60 12/16/2017 1124   GFRAA >60 12/16/2017 1124    No results found for: SPEP, UPEP  Lab Results  Component Value Date   WBC 3.5 (L)  12/16/2017   NEUTROABS 1.5 12/16/2017   HGB 12.0 12/16/2017   HCT 35.9 12/16/2017   MCV 113.5 (H) 12/16/2017   PLT 165 12/16/2017      Chemistry      Component Value Date/Time   NA 140 12/16/2017 1124   NA 142 12/08/2017 1051   K 3.5 12/16/2017 1124   K 4.4 12/08/2017 1051   CL 104 12/16/2017 1124   CL 105 06/14/2017 1430   CO2 28 12/16/2017 1124   CO2 28 12/08/2017  1051   BUN 14 12/16/2017 1124   BUN 16.2 12/08/2017 1051   CREATININE 0.90 12/16/2017 1124   CREATININE 0.9 12/08/2017 1051      Component Value Date/Time   CALCIUM 9.4 12/16/2017 1124   CALCIUM 9.5 12/08/2017 1051   ALKPHOS 155 (H) 12/16/2017 1124   ALKPHOS 131 12/08/2017 1051   AST 20 12/16/2017 1124   AST 18 12/08/2017 1051   ALT 22 12/16/2017 1124   ALT 20 12/08/2017 1051   BILITOT 0.6 12/16/2017 1124   BILITOT 0.70 12/08/2017 1051       RADIOGRAPHIC STUDIES: I have personally reviewed the radiological images as listed and agreed with the findings in the report. Ct Abdomen Pelvis W Contrast  Result Date: 11/25/2017 CLINICAL DATA:  Patient with history of ovarian carcinoma. Restaging evaluation. EXAM: CT ABDOMEN AND PELVIS WITH CONTRAST TECHNIQUE: Multidetector CT imaging of the abdomen and pelvis was performed using the standard protocol following bolus administration of intravenous contrast. CONTRAST:  166m ISOVUE-300 IOPAMIDOL (ISOVUE-300) INJECTION 61% COMPARISON:  CT CAP 09/16/2017. FINDINGS: Lower chest: Heart is mildly enlarged. Dependent atelectasis within the bilateral lower lobes. No pleural effusion. Hepatobiliary: Liver is normal in size and contour. Gallbladder is unremarkable. No intrahepatic or extrahepatic biliary ductal dilatation. Stable 1.8 cm hemangioma within the caudate lobe (image 14; series 2). Pancreas: Unremarkable Spleen: Remarkable Adrenals/Urinary Tract: Normal adrenal glands. Re- demonstrated bilateral renal cortical scarring. Minimal right pelviectasis, unchanged. Urinary  bladder is unremarkable. Stomach/Bowel: No abnormal bowel wall thickening or evidence for bowel obstruction. No free fluid or free intraperitoneal air. Vascular/Lymphatic: Normal caliber abdominal aorta. No retroperitoneal lymphadenopathy. Unchanged 9 mm ileocolic mesenteric lymph node (image 55; series 2), previously 9 mm. Slight interval decrease in size of left external iliac lymph node measuring 8 mm, previously 10 mm (image 76; series 2). Reproductive: Uterus is surgically absent. Other: Low-density implant within the pelvic cul-de-sac superior to the vaginal cuff is no longer visualized. There is persistent thickening of the superior aspect of the vaginal cuff which is nonspecific (image 73; series 2). Ill defined right pelvic soft tissue density implant difficult to measure however approximately 3.0 x 2.0 cm (image 65; series 2), previously 3.2 x 2.1 cm. Interval decrease in size of pericecal nodule measuring 6 mm (image 66; series 2), previously 11 mm. Musculoskeletal: No aggressive or acute appearing osseous lesions. IMPRESSION: 1. Stable to slight interval decrease in size of nodal and peritoneal metastatic disease within the abdomen. 2. No new or progressive disease. Electronically Signed   By: DLovey NewcomerM.D.   On: 11/25/2017 10:17    ASSESSMENT & PLAN:  Ovarian cancer ([place] She is doing very well and tolerated Zejula She will continue weekly blood draw for 3 weeks and then once a month We will continue port flushes to keep her port patent We will draw tumor markers once a month and I plan to repeat imaging study in 3 months, due next March 2019  Chronic atrial fibrillation ([facility] She has chronic atrial fibrillation, rate control She is on chronic anticoagulation therapy For this reason, Avastin is not a good choice for maintenance therapy  Chemotherapy-induced peripheral neuropathy (HCC) Neuropathy is stable She denies significant pain She requested prescription refill of oxycodone to  take as needed and I have given her a prescription refill She can also take it for intermittent back pain  Leukopenia due to antineoplastic chemotherapy ([facility] This is likely due to recent treatment. The patient denies recent history of fevers, cough, chills, diarrhea or  dysuria. She is asymptomatic from the leukopenia. I will observe for now.  I will continue the chemotherapy at current dose without dosage adjustment.  If the leukopenia gets progressive worse in the future, I might have to delay her treatment or adjust the chemotherapy dose.     No orders of the defined types were placed in this encounter.  All questions were answered. The patient knows to call the clinic with any problems, questions or concerns. No barriers to learning was detected. I spent 15 minutes counseling the patient face to face. The total time spent in the appointment was 20 minutes and more than 50% was on counseling and review of test results     Heath Lark, MD 12/16/2017 12:34 PM

## 2017-12-16 NOTE — Assessment & Plan Note (Signed)
She is doing very well and tolerated Zejula She will continue weekly blood draw for 3 weeks and then once a month We will continue port flushes to keep her port patent We will draw tumor markers once a month and I plan to repeat imaging study in 3 months, due next March 2019

## 2017-12-18 ENCOUNTER — Other Ambulatory Visit: Payer: Self-pay | Admitting: Hematology and Oncology

## 2017-12-23 ENCOUNTER — Telehealth: Payer: Self-pay

## 2017-12-23 ENCOUNTER — Inpatient Hospital Stay: Payer: Medicare Other

## 2017-12-23 DIAGNOSIS — C562 Malignant neoplasm of left ovary: Secondary | ICD-10-CM

## 2017-12-23 DIAGNOSIS — C561 Malignant neoplasm of right ovary: Secondary | ICD-10-CM

## 2017-12-23 DIAGNOSIS — C563 Malignant neoplasm of bilateral ovaries: Secondary | ICD-10-CM

## 2017-12-23 DIAGNOSIS — C800 Disseminated malignant neoplasm, unspecified: Secondary | ICD-10-CM

## 2017-12-23 LAB — COMPREHENSIVE METABOLIC PANEL
ALT: 33 U/L (ref 0–55)
AST: 27 U/L (ref 5–34)
Albumin: 3.7 g/dL (ref 3.5–5.0)
Alkaline Phosphatase: 157 U/L — ABNORMAL HIGH (ref 40–150)
Anion gap: 10 (ref 3–11)
BUN: 12 mg/dL (ref 7–26)
CO2: 29 mmol/L (ref 22–29)
Calcium: 9.6 mg/dL (ref 8.4–10.4)
Chloride: 104 mmol/L (ref 98–109)
Creatinine, Ser: 0.91 mg/dL (ref 0.60–1.10)
GFR calc Af Amer: >60 mL/min (ref >60)
GFR calc non Af Amer: >60 mL/min (ref >60)
Glucose, Bld: 101 mg/dL (ref 70–140)
Potassium: 4.1 mmol/L (ref 3.3–4.7)
Sodium: 143 mmol/L (ref 136–145)
Total Bilirubin: 0.5 mg/dL (ref 0.2–1.2)
Total Protein: 7.1 g/dL (ref 6.4–8.3)

## 2017-12-23 LAB — CBC WITH DIFFERENTIAL/PLATELET
Basophils Absolute: 0 10*3/uL (ref 0.0–0.1)
Basophils Relative: 0 %
Eosinophils Absolute: 0.1 10*3/uL (ref 0.0–0.5)
Eosinophils Relative: 2 %
HCT: 35.8 % (ref 34.8–46.6)
Hemoglobin: 11.8 g/dL (ref 11.6–15.9)
Lymphocytes Relative: 48 %
Lymphs Abs: 2.2 10*3/uL (ref 0.9–3.3)
MCH: 36.9 pg — ABNORMAL HIGH (ref 25.1–34.0)
MCHC: 33 g/dL (ref 31.5–36.0)
MCV: 111.9 fL — ABNORMAL HIGH (ref 79.5–101.0)
Monocytes Absolute: 0.5 10*3/uL (ref 0.1–0.9)
Monocytes Relative: 10 %
Neutro Abs: 1.8 10*3/uL (ref 1.5–6.5)
Neutrophils Relative %: 40 %
Platelets: 139 10*3/uL — ABNORMAL LOW (ref 145–400)
RBC: 3.2 MIL/uL — ABNORMAL LOW (ref 3.70–5.45)
RDW: 15.2 % (ref 11.2–16.1)
WBC: 4.5 10*3/uL (ref 3.9–10.3)

## 2017-12-23 NOTE — Telephone Encounter (Signed)
Called with below message. Verbalized understanding. 

## 2017-12-23 NOTE — Telephone Encounter (Signed)
-----   Message from Heath Lark, MD sent at 12/23/2017 11:51 AM EST ----- Regarding: labs Labs are good, continue zejula ----- Message ----- From: Interface, Lab In [place] Sent: 12/23/2017  11:26 AM To: Heath Lark, MD

## 2017-12-24 LAB — CA 125: Cancer Antigen (CA) 125: 11.4 U/mL (ref 0.0–38.1)

## 2017-12-27 ENCOUNTER — Telehealth: Payer: Self-pay | Admitting: Hematology and Oncology

## 2017-12-27 NOTE — Telephone Encounter (Signed)
Patients daughter left a voicemail regarding upcoming January appointments. Spoke to patients daughter and updated schedule per her request. Patient scheduled per 1/10 los.

## 2017-12-28 MED FILL — ZEJULA 100 MG CAPS: 100 | 30 days supply | Qty: 90 | Fill #1

## 2017-12-29 ENCOUNTER — Inpatient Hospital Stay: Payer: Medicare Other

## 2017-12-29 ENCOUNTER — Telehealth: Payer: Self-pay | Admitting: Hematology and Oncology

## 2017-12-29 ENCOUNTER — Telehealth: Payer: Self-pay | Admitting: *Deleted

## 2017-12-29 DIAGNOSIS — C800 Disseminated malignant neoplasm, unspecified: Secondary | ICD-10-CM

## 2017-12-29 DIAGNOSIS — C561 Malignant neoplasm of right ovary: Secondary | ICD-10-CM

## 2017-12-29 DIAGNOSIS — C562 Malignant neoplasm of left ovary: Secondary | ICD-10-CM

## 2017-12-29 DIAGNOSIS — Z95828 Presence of other vascular implants and grafts: Secondary | ICD-10-CM

## 2017-12-29 DIAGNOSIS — C563 Malignant neoplasm of bilateral ovaries: Secondary | ICD-10-CM

## 2017-12-29 LAB — CBC WITH DIFFERENTIAL/PLATELET
Basophils Absolute: 0 10*3/uL (ref 0.0–0.1)
Basophils Relative: 0 %
Eosinophils Absolute: 0.1 10*3/uL (ref 0.0–0.5)
Eosinophils Relative: 2 %
HCT: 35 % (ref 34.8–46.6)
Hemoglobin: 11.6 g/dL (ref 11.6–15.9)
Lymphocytes Relative: 43 %
Lymphs Abs: 1.6 10*3/uL (ref 0.9–3.3)
MCH: 36.7 pg — ABNORMAL HIGH (ref 25.1–34.0)
MCHC: 33.1 g/dL (ref 31.5–36.0)
MCV: 110.8 fL — ABNORMAL HIGH (ref 79.5–101.0)
Monocytes Absolute: 0.4 10*3/uL (ref 0.1–0.9)
Monocytes Relative: 12 %
Neutro Abs: 1.6 10*3/uL (ref 1.5–6.5)
Neutrophils Relative %: 43 %
Platelets: 145 10*3/uL (ref 145–400)
RBC: 3.16 MIL/uL — ABNORMAL LOW (ref 3.70–5.45)
RDW: 15.3 % (ref 11.2–16.1)
WBC: 3.7 10*3/uL — ABNORMAL LOW (ref 3.9–10.3)

## 2017-12-29 LAB — COMPREHENSIVE METABOLIC PANEL
ALT: 29 U/L (ref 0–55)
AST: 26 U/L (ref 5–34)
Albumin: 3.5 g/dL (ref 3.5–5.0)
Alkaline Phosphatase: 155 U/L — ABNORMAL HIGH (ref 40–150)
Anion gap: 9 (ref 3–11)
BUN: 11 mg/dL (ref 7–26)
CO2: 28 mmol/L (ref 22–29)
Calcium: 9.6 mg/dL (ref 8.4–10.4)
Chloride: 104 mmol/L (ref 98–109)
Creatinine, Ser: 0.88 mg/dL (ref 0.60–1.10)
GFR calc Af Amer: >60 mL/min (ref >60)
GFR calc non Af Amer: >60 mL/min (ref >60)
Glucose, Bld: 121 mg/dL (ref 70–140)
Potassium: 3.7 mmol/L (ref 3.3–4.7)
Sodium: 141 mmol/L (ref 136–145)
Total Bilirubin: 0.7 mg/dL (ref 0.2–1.2)
Total Protein: 6.9 g/dL (ref 6.4–8.3)

## 2017-12-29 MED ORDER — SODIUM CHLORIDE 0.9% FLUSH
10.0000 mL | Freq: Once | INTRAVENOUS | Status: AC
  Administered 2017-12-29: 10 mL
  Filled 2017-12-29: qty 10

## 2017-12-29 MED ORDER — HEPARIN SOD (PORK) LOCK FLUSH 100 UNIT/ML IV SOLN
500.0000 [IU] | Freq: Once | INTRAVENOUS | Status: AC
  Administered 2017-12-29: 500 [IU]
  Filled 2017-12-29: qty 5

## 2017-12-29 NOTE — Telephone Encounter (Signed)
Notified of message below. Msg to scheduler. Labs only ,no flush

## 2017-12-29 NOTE — Telephone Encounter (Signed)
Patient is scheduled per 1/23 sch message. Patient aware of date and time.

## 2017-12-29 NOTE — Telephone Encounter (Signed)
-----   Message from Heath Lark, MD sent at 12/29/2017 12:31 PM EST ----- Regarding: labs are oK Let her know labs are ok Please let her know she needs labs appt on 2/11 before I see her. ASk if she needs port/labs or only labs from hand and send scheduling msg ----- Message ----- From: [place]: 12/29/2017  11:43 AM To: Heath Lark, MD

## 2017-12-30 ENCOUNTER — Other Ambulatory Visit: Payer: Medicare Other

## 2018-01-15 ENCOUNTER — Other Ambulatory Visit: Payer: Self-pay | Admitting: Hematology and Oncology

## 2018-01-17 ENCOUNTER — Encounter: Payer: Self-pay | Admitting: Hematology and Oncology

## 2018-01-17 ENCOUNTER — Inpatient Hospital Stay: Payer: Medicare Other

## 2018-01-17 ENCOUNTER — Inpatient Hospital Stay: Payer: Medicare Other | Attending: Hematology and Oncology | Admitting: Hematology and Oncology

## 2018-01-17 ENCOUNTER — Telehealth: Payer: Self-pay | Admitting: Hematology and Oncology

## 2018-01-17 ENCOUNTER — Encounter: Payer: Self-pay | Admitting: Neurology

## 2018-01-17 VITALS — BP 128/89 | HR 114 | Temp 98.5°F | Resp 18 | Ht 65.0 in | Wt 222.7 lb

## 2018-01-17 DIAGNOSIS — G629 Polyneuropathy, unspecified: Secondary | ICD-10-CM | POA: Diagnosis not present

## 2018-01-17 DIAGNOSIS — C562 Malignant neoplasm of left ovary: Secondary | ICD-10-CM | POA: Diagnosis not present

## 2018-01-17 DIAGNOSIS — G620 Drug-induced polyneuropathy: Secondary | ICD-10-CM

## 2018-01-17 DIAGNOSIS — Z9221 Personal history of antineoplastic chemotherapy: Secondary | ICD-10-CM

## 2018-01-17 DIAGNOSIS — Z8673 Personal history of transient ischemic attack (TIA), and cerebral infarction without residual deficits: Secondary | ICD-10-CM | POA: Diagnosis not present

## 2018-01-17 DIAGNOSIS — G319 Degenerative disease of nervous system, unspecified: Secondary | ICD-10-CM | POA: Diagnosis not present

## 2018-01-17 DIAGNOSIS — R918 Other nonspecific abnormal finding of lung field: Secondary | ICD-10-CM | POA: Diagnosis not present

## 2018-01-17 DIAGNOSIS — K769 Liver disease, unspecified: Secondary | ICD-10-CM | POA: Diagnosis not present

## 2018-01-17 DIAGNOSIS — I700 Atherosclerosis of aorta: Secondary | ICD-10-CM | POA: Diagnosis not present

## 2018-01-17 DIAGNOSIS — K5909 Other constipation: Secondary | ICD-10-CM

## 2018-01-17 DIAGNOSIS — I739 Peripheral vascular disease, unspecified: Secondary | ICD-10-CM | POA: Insufficient documentation

## 2018-01-17 DIAGNOSIS — Z79899 Other long term (current) drug therapy: Secondary | ICD-10-CM | POA: Insufficient documentation

## 2018-01-17 DIAGNOSIS — Z90722 Acquired absence of ovaries, bilateral: Secondary | ICD-10-CM

## 2018-01-17 DIAGNOSIS — Z90710 Acquired absence of both cervix and uterus: Secondary | ICD-10-CM | POA: Diagnosis not present

## 2018-01-17 DIAGNOSIS — M545 Low back pain: Secondary | ICD-10-CM | POA: Diagnosis not present

## 2018-01-17 DIAGNOSIS — K5900 Constipation, unspecified: Secondary | ICD-10-CM | POA: Diagnosis not present

## 2018-01-17 DIAGNOSIS — Z7982 Long term (current) use of aspirin: Secondary | ICD-10-CM | POA: Diagnosis not present

## 2018-01-17 DIAGNOSIS — C561 Malignant neoplasm of right ovary: Secondary | ICD-10-CM

## 2018-01-17 DIAGNOSIS — T451X5A Adverse effect of antineoplastic and immunosuppressive drugs, initial encounter: Secondary | ICD-10-CM

## 2018-01-17 DIAGNOSIS — C563 Malignant neoplasm of bilateral ovaries: Secondary | ICD-10-CM

## 2018-01-17 DIAGNOSIS — C800 Disseminated malignant neoplasm, unspecified: Secondary | ICD-10-CM

## 2018-01-17 DIAGNOSIS — D701 Agranulocytosis secondary to cancer chemotherapy: Secondary | ICD-10-CM | POA: Diagnosis not present

## 2018-01-17 LAB — COMPREHENSIVE METABOLIC PANEL
ALT: 19 U/L (ref 0–55)
AST: 21 U/L (ref 5–34)
Albumin: 3.7 g/dL (ref 3.5–5.0)
Alkaline Phosphatase: 153 U/L — ABNORMAL HIGH (ref 40–150)
Anion gap: 8 (ref 3–11)
BUN: 12 mg/dL (ref 7–26)
CO2: 29 mmol/L (ref 22–29)
Calcium: 9.8 mg/dL (ref 8.4–10.4)
Chloride: 104 mmol/L (ref 98–109)
Creatinine, Ser: 0.98 mg/dL (ref 0.60–1.10)
GFR calc Af Amer: >60 mL/min (ref >60)
GFR calc non Af Amer: 58 mL/min — ABNORMAL LOW (ref >60)
Glucose, Bld: 89 mg/dL (ref 70–140)
Potassium: 4.2 mmol/L (ref 3.5–5.1)
Sodium: 141 mmol/L (ref 136–145)
Total Bilirubin: 0.8 mg/dL (ref 0.2–1.2)
Total Protein: 7.1 g/dL (ref 6.4–8.3)

## 2018-01-17 LAB — CBC WITH DIFFERENTIAL/PLATELET
Basophils Absolute: 0 10*3/uL (ref 0.0–0.1)
Basophils Relative: 0 %
Eosinophils Absolute: 0 10*3/uL (ref 0.0–0.5)
Eosinophils Relative: 1 %
HCT: 35.3 % (ref 34.8–46.6)
Hemoglobin: 11.7 g/dL (ref 11.6–15.9)
Lymphocytes Relative: 44 %
Lymphs Abs: 1.6 10*3/uL (ref 0.9–3.3)
MCH: 38.1 pg — ABNORMAL HIGH (ref 25.1–34.0)
MCHC: 33.3 g/dL (ref 31.5–36.0)
MCV: 114.5 fL — ABNORMAL HIGH (ref 79.5–101.0)
Monocytes Absolute: 0.5 10*3/uL (ref 0.1–0.9)
Monocytes Relative: 13 %
Neutro Abs: 1.6 10*3/uL (ref 1.5–6.5)
Neutrophils Relative %: 42 %
Platelets: 198 10*3/uL (ref 145–400)
RBC: 3.08 MIL/uL — ABNORMAL LOW (ref 3.70–5.45)
RDW: 18.4 % — ABNORMAL HIGH (ref 11.2–14.5)
WBC: 3.8 10*3/uL — ABNORMAL LOW (ref 3.9–10.3)

## 2018-01-17 MED ORDER — OXYCODONE HCL 5 MG PO TABS: 5.0000 mg | ORAL_TABLET | ORAL | 0 refills | Status: DC | PRN

## 2018-01-17 NOTE — Assessment & Plan Note (Addendum)
She is doing very well and tolerated Zejula We will draw tumor markers once a month and I plan to repeat imaging study every 3 months, due next March 2019 I do not believe her constipation and worsening pain are related to disease Her most recent tumor marker was within normal limits

## 2018-01-17 NOTE — Assessment & Plan Note (Signed)
She has chronic constipation, likely exacerbated by gabapentin, her pain medicine and verapamil We discussed the importance of taking laxatives regularly.

## 2018-01-17 NOTE — Progress Notes (Signed)
[place] OFFICE PROGRESS NOTE  Patient Care Team: Helane Rima, MD as PCP - General (Family Medicine) Encarnacion Slates, MD as Referring Physician (Neurology)  SUMMARY OF ONCOLOGIC HISTORY: Oncology History   Negative genetic testing     Ovarian cancer [facility])   11/28/2015 Imaging    Ct scan of abdomen: Peritoneal carcinomatosis and pelvic/inguinal adenopathy. Gynecologic primary is favored.      12/17/2015 Pathology Results    Lymph node, needle/core biopsy, L inguinal LAN - METASTATIC ADENOCARCINOMA. Microscopic Comment Immunohistochemistry will be performed and reported as an addendum. (JDP:ecj 12/18/2015) ADDENDUM: Immunohistochemistry shows strong positivity with cytokeratin 7, estrogen receptor (ER), progesterone receptor (PR), p53 and WT1. Negative markers are cytokeratin 20 , CDX- 2 and gross cystic disease fluid protein. 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 (JDP:kh 12-19-15      12/17/2015 Procedure    Technically successful ultrasound guided core left inguinal adenopathy biopsy      12/24/2015 Tumor Marker    Patient's tumor was tested for the following markers: CA-125 Results of the tumor marker test revealed 608.2      12/27/2015 - 02/28/2016 Chemotherapy    She received neoadjuvant chemo x 3 cycles      01/01/2016 Tumor Marker    Patient's tumor was tested for the following markers: CA-125 Results of the tumor marker test revealed 741.6      01/29/2016 Procedure    Successful placement of a right internal jugular approach power injectable Port-A-Cath. The catheter is ready for immediate use      01/29/2016 Imaging    US abdomen 1. Hyperechoic 2.5 x 1.0 x 1.6 cm lesion in the region the caudate lobe of the liver. Similar finding noted on prior CT of 11/28/2015. This could represent hemangioma. This could represent a malignancy including metastatic  disease. 2. Exam otherwise unremarkable. No gallstones. No biliary distention.      02/03/2016 Tumor Marker    Patient's tumor was tested for the following markers: CA-125 Results of the tumor marker test revealed 137.1      02/20/2016 Imaging    MRI brain No acute infarct.  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  No intracranial mass.  Moderate small vessel disease changes.  Global atrophy without hydrocephalus.  Expanded partially empty sella without secondary findings of pseudotumor cerebri.  Minimal mucosal thickening ethmoid sinus air cells. Small air-fluid levels maxillary sinuses bilaterally may indicate changes of acute sinusitis.       02/28/2016 Tumor Marker    Patient's tumor was tested for the following markers: CA-125 Results of the tumor marker test revealed 33.6      03/02/2016 Imaging    CT chest, abdomen and pelvis 1. Today's study demonstrates a positive response to therapy with resolution of the previously noted malignant ascites, significant regression of previously noted peritoneal implants, and regression of previously noted lymphadenopathy in the abdomen and pelvis. 2. No definite evidence to suggest metastatic disease to the thorax. 3. Stable 1.0 x 1.7 cm intermediate attenuation lesion associated with the posterior aspect of segment 1 of the liver, favored to represent a mildly proteinaceous hepatic cyst. The possibility of a peritoneal implant in this region is not entirely excluded, but is not favored on today's examination. 4. Extensive post infectious scarring inguinal upper right lung, likely sequela of prior necrotizing pneumonia. 5. Multiple tiny pulmonary nodules scattered throughout the lungs bilaterally all measuring 4 mm or  less. These are nonspecific, but favored to be benign. Given the patient's history of primary gynecologic malignancy, attention on followup studies is recommended to ensure the stability of these  findings. 6. Additional incidental findings, as above      03/14/2016 Imaging    CT angiogram 1. No pulmonary emboli. 2. Chronic changes to the right upper lobe. 3. Stable lesion in the caudate lobe of the liver. 4. Stable soft tissue prominence to the right of the trachea as described above. 5. Stable small nodule in the right lung.       03/24/2016 Pathology Results    1. Omentum, resection for tumor - FOCAL ADENOCARCINOMA ASSOCIATED WITH EXTENSIVE FIBROSIS, INFLAMMATION AND HEMOSIDERIN DEPOSITION. 2. Uterus +/- tubes/ovaries, neoplastic, cervix - CERVIX: SLIGHT CERVICITIS AND SQUAMOUS METAPLASIA. - ENDOMETRIUM: ATROPHIC, NO HYPERPLASIA OR MALIGNANCY. - MYOMETRIUM: LEIOMYOMATA WITH DEGENERATIVE CHANGES. NO MALIGNANCY. - UTERINE SEROSA: FOCAL ADENOCARCINOMA ASSOCIATED WITH ADHESIONS. - RIGHT OVARY: BENIGN CALCIFIED NODULE AND BENIGN SEROUS CYST. NO MALIGNANCY IDENTIFIED. - RIGHT FALLOPIAN TUBE: UNREMARKABLE. NO MALIGNANCY IDENTIFIED. - LEFT OVARY: FOCAL ADENOCARCINOMA ASSOCIATED WITH EXTENSIVE FIBROSIS. - LEFT FALLOPIAN TUBE: HYDROSALPINX. NO MALIGNANCY IDENTIFIED. 3. Lymph node, biopsy, right external iliac - METASTATIC ADENOCARCINOMA, 4. Soft tissue, biopsy, left para colic gutter - FOCAL ADENOCARCINOMA ASSOCIATED WITH FIBROSIS. Microscopic Comment 2. OVARY Specimen(s): Uterus with bilateral ovaries, omentum, right external iliac lymph node and left paracolic gutter. Procedure: (including lymph node sampling) Hysterectomy with bilateral salpingo-oophorectomy, omentectomy, lymph node biopsy and paracolic gutter biopsy. Primary tumor site (including laterality): Left ovary. Ovarian surface involvement: Yes Ovarian capsule intact without fragmentation: N/A Maximum tumor size (cm): 2 cm, 2 cm Histologic type: Serous carcinoma. Grade: 3 Peritoneal implants: (specify invasive or non-invasive): Left paracolic gutter positive for carcinoma Pelvic extension (list additional  structures on separate lines and if involved): Omentum, right paracolic gutter and uterine serosa. Lymph nodes: number examined 1 ; number positive 1 TNM code: ypT3a, ypN1b, ypMX FIGO Stage (based on pathologic findings, needs clinical correlation): IIIA2 Comments: The left ovary has a 2 cm nodule, which is composed primarily of fibrous tissue with small foci of residual adenocarcinoma. There is also microscopic involvement of the uterine serosa, the left paracolic gutter biopsy and the omentum. The left paracolic gutter and omental involvement is microscopic and both are associated with extensive fibrosis with focal hemosiderin deposition. The right external iliac node is completely replaced by metastatic adenocarcinoma with serous features. (JDP:ecj 03/30/2016)      03/24/2016 Surgery    Operation: Robotic-assisted laparoscopic total hysterectomy with bilateral salpingoophorectomy, omentectomy, radical tumor debulking  Surgeon: Donaciano Eva  Operative Findings:  : small nodules in omentum, no ascites. Omental nodule (2cm) adherent to lower anterior abdominal wall. 2cm left colonic gutter cystic nodule. 6cm right external iliac lymph node. Grossly normal ovaries and tubes. Fibroid uterus. No gross residual disease at completion of surgery representing R0 optimal/complete resection.       04/17/2016 Imaging    CT abdomen and pelvis 1. There is no evidence of acute inflammatory process within abdomen or pelvis. 2. Stable low-density lesion within caudate lobe of the liver. No new hepatic lesions are noted. 3. Again noted lobulated renal contour and multifocal renal cortical scarring. No hydronephrosis or hydroureter. 4. No significant mesenteric adenopathy. No retroperitoneal adenopathy. Stable minimal residual peritoneal thickening within pelvis. No new pelvic implants or pelvic ascites. 5. Status post hysterectomy.  Unremarkable urinary bladder. 6. Moderate stool within colon as  described above. No evidence of colitis.  04/24/2016 - 06/19/2016 Chemotherapy    She received 3 more cycles of chemo after surgery      05/14/2016 Tumor Marker    Patient's tumor was tested for the following markers: CA-125 Results of the tumor marker test revealed 22.5      06/04/2016 Tumor Marker    Patient's tumor was tested for the following markers: CA-125 Results of the tumor marker test revealed 13.7      07/07/2016 Genetic Testing    Patient has genetic testing done for breast/ovasrian cancer panel Results revealed patient has no actionable mutation      07/23/2016 Imaging    Ct abdomen and pelvis 1. Heterogeneously enhancing 4.9 x 3.2 x 4.4 cm mass along the right pelvic sidewall may represent locally recurrent disease or malignant lymphadenopathy. 2. No other signs of definite metastatic disease noted elsewhere in the abdomen or pelvis. 3. Stable low-attenuation hepatic lesion in the caudate lobe of the liver, which appears to demonstrates some progressive centripetal filling on delayed images. This lesion is presumably benign given its stability compared to prior studies, and is favored to represent a small cavernous hemangioma. 4. Cardiomegaly with biatrial dilatation, which is very severe on the right side. 5. Aortic atherosclerosis. 6. Additional incidental findings, as above.      07/23/2016 Tumor Marker    Patient's tumor was tested for the following markers: CA-125 Results of the tumor marker test revealed 12.3      09/30/2016 Imaging    Ct abdomen and pelvis: 1. 4.9 cm heterogeneously enhancing mass seen along the right pelvic sidewall previously has almost completely resolved. There is some ill-defined soft tissue attenuation/peritoneal thickening in this region today but no discrete measurable lesion is evident. 2. No new or progressive findings on today's exam. 3. Stable hypo attenuating lesion in the caudate lobe of the liver. 4. Subtle aortic  atherosclerosis      09/30/2016 Tumor Marker    Patient's tumor was tested for the following markers: CA-125 Results of the tumor marker test revealed 9.6      10/05/2016 Pathology Results    Vagina, biopsy, left cuff - BENIGN FIBROEPITHELIAL (STROMAL) POLYP. - NO DYSPLASIA, ATYPIA OR MALIGNANCY IDENTIFIED.      01/14/2017 Tumor Marker    Patient's tumor was tested for the following markers: CA-125 Results of the tumor marker test revealed 11.9      05/05/2017 Tumor Marker    Patient's tumor was tested for the following markers: CA-125 Results of the tumor marker test revealed 28      06/14/2017 Tumor Marker    Patient's tumor was tested for the following markers: CA-125 Results of the tumor marker test revealed 45.7      06/24/2017 Imaging    Ct abdomen and pelvis: Increased peritoneal metastatic disease in abdomen and pelvis since prior exam. No evidence of ascites. Stable small benign hepatic hemangioma.      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 Does of chemo is reduced starting cycle 2 onwards      09/02/2017 Tumor Marker    Patient's tumor was tested for the following markers: CA-125 Results of the tumor marker test revealed 27.7      09/16/2017  Imaging    CT CHEST IMPRESSION  1. No acute process or evidence of metastatic disease in the chest. 2. Right upper lung bronchiectasis, consolidation, and architectural distortion are most likely post infectious or inflammatory and not significantly changed. 3. Aortic Atherosclerosis (ICD10-I70.0).  CT ABDOMEN AND PELVIS IMPRESSION  1. Response to therapy of nodal and peritoneal metastasis. 2. No new or progressive disease. 3. Caudate lobe hemangioma, as before. 4.  Bilateral renal cortical thinning. Similar minimal right-sided caliectasis and hydroureter. Likely secondary to low-grade obstruction by the dominant right pelvic implant.      09/22/2017 Tumor Marker    Patient's tumor was tested for the following markers: CA-125 Results of the tumor marker test revealed 18      11/03/2017 Tumor Marker    Patient's tumor was tested for the following markers: CA-125 Results of the tumor marker test revealed 13.4      11/25/2017 Imaging    1. Stable to slight interval decrease in size of nodal and peritoneal metastatic disease within the abdomen. 2. No new or progressive disease.       INTERVAL HISTORY: Please see below for problem oriented charting. She returns with her daughter for further follow-up She complained of persistent lower back pain and neuropathic pain, stable on gabapentin and pain medicine She complained of constipation No recent nausea  no recent infection  REVIEW OF SYSTEMS:   Constitutional: Denies fevers, chills or abnormal weight loss Eyes: Denies blurriness of vision Ears, nose, mouth, throat, and face: Denies mucositis or sore throat Respiratory: Denies cough, dyspnea or wheezes Cardiovascular: Denies palpitation, chest discomfort or lower extremity swelling Skin: Denies abnormal skin rashes Lymphatics: Denies new lymphadenopathy or easy bruising Behavioral/Psych: Mood is stable, no new changes  All other systems were reviewed with the patient and are negative.  I have reviewed the past medical history, past surgical history, social history and family history with the patient and they are unchanged from previous note.  ALLERGIES:  is allergic to codeine and latex.  MEDICATIONS:  Current Outpatient Medications  Medication Sig Dispense Refill  . apixaban (ELIQUIS) 5 MG TABS tablet Take 1 tablet (5 mg total) by mouth 2 (two) times daily. 60 tablet 5  . aspirin EC 81 MG tablet Take 81 mg by mouth daily.    Marland Kitchen  atorvastatin (LIPITOR) 40 MG tablet Take 40 mg by mouth at bedtime.    . gabapentin (NEURONTIN) 300 MG capsule Take 1 capsule (300 mg total) 3 (three) times daily by mouth. 90 capsule 3  . gabapentin (NEURONTIN) 300 MG capsule TAKE ONE CAPSULE BY MOUTH TWICE DAILY 60 capsule 0  . lidocaine-prilocaine (EMLA) cream Apply to Porta-Cath 1-2 hours prior to access as directed. 30 g 1  . loratadine (CLARITIN) 10 MG tablet Take 10 mg by mouth daily as needed (Recommended for chemo pain). Reported on 03/16/2016    . mirtazapine (REMERON) 15 MG tablet Take 15 mg by mouth at bedtime.    Alanda Slim Tosylate (ZEJULA) 100 MG CAPS Take 300 mg by mouth daily. 90 capsule 11  . omega-3 acid ethyl esters (LOVAZA) 1 g capsule Take 1 g by mouth daily.    Marland Kitchen oxyCODONE (OXY IR/ROXICODONE) 5 MG immediate release tablet Take 1 tablet (5 mg total) by mouth every 4 (four) hours as needed for severe pain. 90 tablet 0  . PARoxetine (PAXIL) 20 MG tablet TAKE 1 TABLET BY MOUTH DAILY (Patient taking differently: TAKE 20 MG BY MOUTH DAILY) 90 tablet 0  .  verapamil (CALAN-SR) 240 MG CR tablet Take 1 tablet (240 mg total) by mouth daily. 30 tablet 0   No current facility-administered medications for this visit.     PHYSICAL EXAMINATION: ECOG PERFORMANCE STATUS: 2 - Symptomatic, <50% confined to bed  Vitals:   01/17/18 1014  BP: 128/89  Pulse: (!) 114  Resp: 18  Temp: 98.5 F (36.9 C)  SpO2: 100%   Filed Weights   01/17/18 1014  Weight: 222 lb 11.2 oz (101 kg)    GENERAL:alert, no distress and comfortable SKIN: skin color, texture, turgor are normal, no rashes or significant lesions EYES: normal, Conjunctiva are pink and non-injected, sclera clear OROPHARYNX:no exudate, no erythema and lips, buccal mucosa, and tongue normal  NECK: supple, thyroid normal size, non-tender, without nodularity LYMPH:  no palpable lymphadenopathy in the cervical, axillary or inguinal LUNGS: clear to auscultation and percussion with  normal breathing effort HEART: regular rate & rhythm and no murmurs and no lower extremity edema ABDOMEN:abdomen soft, non-tender and normal bowel sounds Musculoskeletal:no cyanosis of digits and no clubbing  NEURO: alert & oriented x 3 with dysarthria and persistent right-sided neurological deficit, stable LABORATORY DATA:  I have reviewed the data as listed    Component Value Date/Time   NA 141 01/17/2018 0942   NA 142 12/08/2017 1051   K 4.2 01/17/2018 0942   K 4.4 12/08/2017 1051   CL 104 01/17/2018 0942   CL 105 06/14/2017 1430   CO2 29 01/17/2018 0942   CO2 28 12/08/2017 1051   GLUCOSE 89 01/17/2018 0942   GLUCOSE 97 12/08/2017 1051   BUN 12 01/17/2018 0942   BUN 16.2 12/08/2017 1051   CREATININE 0.98 01/17/2018 0942   CREATININE 0.9 12/08/2017 1051   CALCIUM 9.8 01/17/2018 0942   CALCIUM 9.5 12/08/2017 1051   PROT 7.1 01/17/2018 0942   PROT 6.9 12/08/2017 1051   ALBUMIN 3.7 01/17/2018 0942   ALBUMIN 3.6 12/08/2017 1051   AST 21 01/17/2018 0942   AST 18 12/08/2017 1051   ALT 19 01/17/2018 0942   ALT 20 12/08/2017 1051   ALKPHOS 153 (H) 01/17/2018 0942   ALKPHOS 131 12/08/2017 1051   BILITOT 0.8 01/17/2018 0942   BILITOT 0.70 12/08/2017 1051   GFRNONAA 58 (L) 01/17/2018 0942   GFRAA >60 01/17/2018 0942    No results found for: SPEP, UPEP  Lab Results  Component Value Date   WBC 3.8 (L) 01/17/2018   NEUTROABS 1.6 01/17/2018   HGB 11.7 01/17/2018   HCT 35.3 01/17/2018   MCV 114.5 (H) 01/17/2018   PLT 198 01/17/2018      Chemistry      Component Value Date/Time   NA 141 01/17/2018 0942   NA 142 12/08/2017 1051   K 4.2 01/17/2018 0942   K 4.4 12/08/2017 1051   CL 104 01/17/2018 0942   CL 105 06/14/2017 1430   CO2 29 01/17/2018 0942   CO2 28 12/08/2017 1051   BUN 12 01/17/2018 0942   BUN 16.2 12/08/2017 1051   CREATININE 0.98 01/17/2018 0942   CREATININE 0.9 12/08/2017 1051      Component Value Date/Time   CALCIUM 9.8 01/17/2018 0942   CALCIUM  9.5 12/08/2017 1051   ALKPHOS 153 (H) 01/17/2018 0942   ALKPHOS 131 12/08/2017 1051   AST 21 01/17/2018 0942   AST 18 12/08/2017 1051   ALT 19 01/17/2018 0942   ALT 20 12/08/2017 1051   BILITOT 0.8 01/17/2018 0942   BILITOT 0.70 12/08/2017 1051  ASSESSMENT & PLAN:  Ovarian cancer ([place]) She is doing very well and tolerated Zejula We will draw tumor markers once a month and I plan to repeat imaging study every 3 months, due next March 2019 I do not believe her constipation and worsening pain are related to disease Her most recent tumor marker was within normal limits  Leukopenia due to antineoplastic chemotherapy [facility]) This is likely due to recent treatment. The patient denies recent history of fevers, cough, chills, diarrhea or dysuria. She is asymptomatic from the leukopenia. I will observe for now.  I will continue the chemotherapy at current dose without dosage adjustment.  If the leukopenia gets progressive worse in the future, I might have to delay her treatment or adjust the chemotherapy dose.    Chemotherapy-induced peripheral neuropathy (HCC) Neuropathy is stable She denies significant pain She requested prescription refill of oxycodone to take as needed and I have given her a prescription refill She can also take it for intermittent back pain She is also on gabapentin for pain  Other constipation She has chronic constipation, likely exacerbated by gabapentin, her pain medicine and verapamil We discussed the importance of taking laxatives regularly.   Orders Placed This Encounter  Procedures  . CT ABDOMEN PELVIS W CONTRAST    Standing Status:   Future    Standing Expiration Date:   01/17/2019    Order Specific Question:   If indicated for the ordered procedure, I authorize the administration of contrast media per Radiology protocol    Answer:   Yes    Order Specific Question:   Preferred imaging location?    Answer:   [facility]    Order Specific  Question:   Radiology Contrast Protocol - do NOT remove file path    Answer:   [email or web address]  . CBC with Differential/Platelet    Standing Status:   Standing    Number of Occurrences:   22    Standing Expiration Date:   01/17/2019  . Comprehensive metabolic panel    Standing Status:   Standing    Number of Occurrences:   22    Standing Expiration Date:   01/17/2019   All questions were answered. The patient knows to call the clinic with any problems, questions or concerns. No barriers to learning was detected. I spent 20 minutes counseling the patient face to face. The total time spent in the appointment was 30 minutes and more than 50% was on counseling and review of test results     Heath Lark, MD 01/17/2018 11:35 AM

## 2018-01-17 NOTE — Assessment & Plan Note (Signed)
This is likely due to recent treatment. The patient denies recent history of fevers, cough, chills, diarrhea or dysuria. She is asymptomatic from the leukopenia. I will observe for now.  I will continue the chemotherapy at current dose without dosage adjustment.  If the leukopenia gets progressive worse in the future, I might have to delay her treatment or adjust the chemotherapy dose.   

## 2018-01-17 NOTE — Assessment & Plan Note (Signed)
Neuropathy is stable She denies significant pain She requested prescription refill of oxycodone to take as needed and I have given her a prescription refill She can also take it for intermittent back pain She is also on gabapentin for pain

## 2018-01-17 NOTE — Telephone Encounter (Signed)
Gave patient avs and calendar with appts per 2/11 los.  °

## 2018-01-18 ENCOUNTER — Other Ambulatory Visit: Payer: Self-pay | Admitting: Pharmacist

## 2018-01-18 LAB — CA 125: Cancer Antigen (CA) 125: 11.9 U/mL (ref 0.0–38.1)

## 2018-01-21 ENCOUNTER — Telehealth: Payer: Self-pay | Admitting: Rehabilitative and Restorative Service Providers"

## 2018-01-21 ENCOUNTER — Ambulatory Visit: Payer: Medicare Other | Attending: Family Medicine | Admitting: Rehabilitative and Restorative Service Providers"

## 2018-01-21 ENCOUNTER — Other Ambulatory Visit: Payer: Self-pay

## 2018-01-21 ENCOUNTER — Encounter: Payer: Self-pay | Admitting: Rehabilitative and Restorative Service Providers"

## 2018-01-21 DIAGNOSIS — R2681 Unsteadiness on feet: Secondary | ICD-10-CM | POA: Insufficient documentation

## 2018-01-21 DIAGNOSIS — R2689 Other abnormalities of gait and mobility: Secondary | ICD-10-CM | POA: Diagnosis present

## 2018-01-21 DIAGNOSIS — M6281 Muscle weakness (generalized): Secondary | ICD-10-CM | POA: Diagnosis not present

## 2018-01-21 NOTE — Telephone Encounter (Signed)
Dr. Lavone Neri, Ms. Pascual was evaluated by physical therapy on 01/21/18.  The patient would benefit from OT evaluation for R hand/UE hemimotor impairments.   If you agree, please place an order in [facility] workque in [facility] or fax the order to [phone]. Thank you, [place], [place] [address] [place], [place]  [address] Phone:  [phone] Fax:  [phone]

## 2018-01-21 NOTE — Therapy (Signed)
[facility] [address] [place], [place], [address] Phone: [phone]   Fax:  [phone]  Physical Therapy Evaluation  Patient Details  Name: Brenda Cunningham MRN: [id number] Date of Birth: [birth year] Referring Provider: Helane Rima, MD   Encounter Date: 01/21/2018  PT End of Session - 01/21/18 1703    Visit Number  1    Number of Visits  17 eval +16 visits    Date for PT Re-Evaluation  03/22/18    Authorization Type  medicare    PT Start Time  1408    PT Stop Time  1450    PT Time Calculation (min)  42 min    Equipment Utilized During Treatment  Gait belt    Activity Tolerance  Patient tolerated treatment well    Behavior During Therapy  WFL for tasks assessed/performed       Past Medical History:  Diagnosis Date  . A-fib ([place])   . Anxiety   . Arthritis   . Breast cancer ([place]) 2008   s/p lumpectomy, chemo and radiation therapy  . Complication of anesthesia    DIFFICULTY WAKING UP FROM ANESTHESIA  . Hyperlipidemia   . Hypertension   . Stroke ([place]) 07/29/2015  . Wears glasses     Past Surgical History:  Procedure Laterality Date  . APPENDECTOMY    . BREAST LUMPECTOMY  2008   left  . COLONOSCOPY  2004  . DEBULKING N/A 03/24/2016   Procedure: RADICAL TUMOR DEBULKING;  Surgeon: Everitt Amber, MD;  Location: [facility];  Service: Gynecology;  Laterality: N/A;  . OMENTECTOMY N/A 03/24/2016   Procedure: OMENTECTOMY;  Surgeon: Everitt Amber, MD;  Location: [facility];  Service: Gynecology;  Laterality: N/A;  . ROBOTIC ASSISTED TOTAL HYSTERECTOMY WITH BILATERAL SALPINGO OOPHERECTOMY Bilateral 03/24/2016   Procedure: XI ROBOTIC ASSISTED TOTA LAPAROSCOPIC  HYSTERECTOMY WITH BILATERAL SALPINGO OOPHORECTOMY;  Surgeon: Everitt Amber, MD;  Location: [facility];  Service: Gynecology;  Laterality: Bilateral;  . TUBAL LIGATION      There were no vitals filed for this visit.   Subjective Assessment - 01/21/18 1411    Subjective  The  patient is s/p L MCA in 07/2015.  She also has undergone chemotherapy for ovarian cancer.  The patient reports "I can't walk.  I can't get this hand to open to use it."  She also reports that speech therapy is needed.  The patient was able to get into/out of truck and now she cannot.     Patient is accompained by:  Family member daughter accompanies her    Patient Stated Goals  Has not been able to get in/out of truck since beginning chemotherapy (months ago).          [facility] PT Assessment - 01/21/18 1413      Assessment   Medical Diagnosis  L MCA infarct/ R hemimotor impairments, chemotherapy from ovarian cancer    Referring Provider  Helane Rima, MD    Onset Date/Surgical Date  -- initial stroke 07/2015    Prior Therapy  known to our clinic from prior PT/.OT/ST  :  patient's daughter requests OT and ST, however patient states she does not want ST at this time.  She wants more help with her hand.      Precautions   Precautions  Fall    Required Braces or Orthoses  -- hand splint on walker      Restrictions   Weight Bearing Restrictions  No      Balance  Screen   Has the patient fallen in the past 6 months  Yes    How many times?  3- cannot get leg under her to get up from floor per report    Has the patient had a decrease in activity level because of a fear of falling?   Yes    Is the patient reluctant to leave their home because of a fear of falling?   Yes      [place] residence    Type of [place] to enter    Entrance Stairs-Number of Steps  4    Entrance Stairs-Rails  Can reach both    [place]  One level    [place] - 2 wheels;Wheelchair - manual;Toilet riser;Shower seat;Cane - quad lift chair, has manual w/c, but does not use it.    Additional Comments  Patient has help from boyfriend for dressing, cooking, and supervision in the home.  She walks with quad cane in the home per daughter's report.       Prior Function   Level of Independence  Needs assistance with ADLs;Needs assistance with homemaking;Independent with household mobility with device    Comments  spends time on the porch; typical day is watching TV and going to the bathroom.  She eats meals at a TV tray that her boyfriend brings her.       Cognition   Overall Cognitive Status  History of cognitive impairments - at baseline      Sensation   Light Touch  -- impaired in right arm per report      Posture/Postural Control   Posture/Postural Control  Postural limitations    Postural Limitations  Rounded Shoulders;Forward head      ROM / Strength   AROM / PROM / Strength  AROM;Strength      AROM   Overall AROM Comments  Limied AROM to 80 degrees R shoulder flexion/abduction.  L UE WFLs.  R hand is tight with flexor tone.  R LE limited in ankle DF ROM and hamstring tightness present.       Strength   Overall Strength Comments  R shoulder <3/5 with limited range, L shoulder flexion 4/5, shoulder abduction 4/5, elbow flexion 4/5.  L hip flexion 4/5, L knee flexion/extension 4/5 and dorsiflexion 4-/5.  R hip flexion 2+/5, R knee flexion 3/5, R knee extension 4-/5, R ankle DF inaccurate due to limited ROM.      Bed Mobility   Bed Mobility  -- reports in/out of bed pulling at cane.      Transfers   Transfers  Sit to Stand    Sit to Stand  4: Min assist;3: Mod assist    Sit to Stand Details (indicate cue type and reason)  Needs min A from chair with arm rests and mod assist from mat surface      Ambulation/Gait   Ambulation/Gait  Yes    Ambulation/Gait Assistance  4: Min guard    Ambulation Distance (Feet)  100 Feet    Assistive device  Rolling walker with modified R grip/ R splint on walker    Gait Pattern  Decreased stride length;Decreased weight shift to right;Decreased dorsiflexion - right    Ambulation Surface  Level;Indoor    Gait velocity  1.95 ft/sec    Stairs  Yes    Stairs Assistance  5: Supervision     Stair Management  Technique  One rail Left ascending reciprocal/ step to descending    Number of Stairs  4      Standardized Balance Assessment   Standardized Balance Assessment  Berg Balance Test      Berg Balance Test   Sit to Stand  Needs minimal aid to stand or to stabilize from chair    Standing Unsupported  Able to stand 30 seconds unsupported    Sitting with Back Unsupported but Feet Supported on Floor or Stool  Able to sit safely and securely 2 minutes    Stand to Sit  Sits independently, has uncontrolled descent    Transfers  Needs one person to assist    Standing Unsupported with Eyes Closed  Able to stand 3 seconds    Standing Ubsupported with Feet Together  Needs help to attain position and unable to hold for 15 seconds    From Standing, Reach Forward with Outstretched Arm  Loses balance while trying/requires external support    From Standing Position, Pick up Object from Floor  Unable to try/needs assist to keep balance    From Standing Position, Turn to Look Behind Over each Shoulder  Needs assist to keep from losing balance and falling    Turn 360 Degrees  Needs assistance while turning    Standing Unsupported, Alternately Place Feet on Step/Stool  Needs assistance to keep from falling or unable to try    Standing Unsupported, One Foot in [employer] balance while stepping or standing    Standing on One Leg  Unable to try or needs assist to prevent fall    Total Score  11    Berg comment:  11/56 today indicating high fall risk             Objective measurements completed on examination: See above findings.              PT Education - 01/21/18 2043    Education provided  Yes    Education Details  goals of therapy    Person(s) Educated  Patient    Methods  Explanation    Comprehension  Verbalized understanding       PT Short Term Goals - 01/21/18 2044      PT SHORT TERM GOAL #1   Title  The patient will perform HEP with assist from caregiver.       Time  4    Period  Weeks    Target Date  02/20/18      PT SHORT TERM GOAL #2   Title  The patient will improve Berg from 11/56 to > or equal to 16/56 to demonstrate improved steady state standing for ADLs.    Time  4    Period  Weeks    Target Date  02/20/18      PT SHORT TERM GOAL #3   Title  The patient will move sit<>stand from mat height surface with SBA.    Baseline  mod A from mat sit<>stand to RW.    Time  4    Period  Weeks    Target Date  02/20/18      PT SHORT TERM GOAL #4   Title  The patient will improve gait speed from 1.95 ft/sec to > or equal to 2.3 ft/sec to demonstrate improving functional mobility.    Time  4    Period  Weeks    Target Date  02/20/18      PT SHORT TERM GOAL #5  Title  The patient will ambulate x 400 feet nonstop with least restrictive assistive device mod indep for improved household mobility.    Time  4    Period  Weeks    Target Date  02/20/18        PT Long Term Goals - 01/21/18 2048      PT LONG TERM GOAL #1   Title  The patient will report eating meals at kitchen table 2 out of 3 meals/day to demo increasing walking in home for long term mobility.    Time  8    Period  Weeks    Target Date  03/22/18      PT LONG TERM GOAL #2   Title  The patient will improve Berg from 11/56 to > or equal to 20/56 to demonstrate improving static balance for ADLs.    Time  8    Period  Weeks    Target Date  03/22/18      PT LONG TERM GOAL #3   Title  The patient will move sit<>stand from chair with arm rests and mat height surfaces mod indep to assistive device 5/5 times for improved mobility.    Time  8    Period  Weeks    Target Date  03/22/18      PT LONG TERM GOAL #4   Title  The patient will negotiate 4 steps mod indep with step to pattern descending for household entry/exit.    Time  8    Period  Weeks    Target Date  03/22/18      PT LONG TERM GOAL #5   Title  Pt will ambulate 524ft with LRAD and supervision outdoors over  level and unlevel paved surfaces, ramps, and curbs to incr functional mobility and safety while ambulating in the community.    Time  8    Period  Weeks    Target Date  03/22/18             Plan - 01/21/18 2051    Clinical Impression Statement  The patient is a 67 yo female known to our clinic from prior physical therapy s/p CVA 07/2015 and chemotherapy due to ovarian cancer.  She presents today with noted decline since last PT at our clinic in 11/2016.  The patient's main impairments include LE weakness, LE muscle tightness, decreased balance, slowed gait speed, hemimotor impairments, and deconditioning.  She is a high all risk scoring 11/56 on Berg balance score and has limited mobility throughout her daily tasks.  PT to address impairments to promote improved independence and safety in the home environment.     History and Personal Factors relevant to plan of care:  occasional falls, declining mobilty, limited walking in home, speech deficits from CVA, peripheral neuropathy from chemotherapy, h/o ovarian CA    Clinical Presentation  Evolving    Clinical Presentation due to:  declining mobility, falls, difficulty moving sit>stand    Clinical Decision Making  Moderate    Rehab Potential  Good    Clinical Impairments Affecting Rehab Potential  inactive lifestyle, carryover of exercise to home    PT Frequency  2x / week eval +    PT Duration  8 weeks    PT Treatment/Interventions  ADLs/Self Care Home Management;Gait training;Stair training;Functional mobility training;Therapeutic activities;Therapeutic exercise;Patient/family education;Balance training    PT Next Visit Plan  Establish HEP to help with sit<>stand (heel cord stretching, quad strengthening, mechanics of sit<>stand *discuss shoewear as she thinks  slippers are reason her right foot slides).  Gait activities, LE strengthening, standing balance dec'ing UE support.    Consulted and Agree with Plan of Care  Patient       Patient  will benefit from skilled therapeutic intervention in order to improve the following deficits and impairments:  Abnormal gait, Decreased mobility, Impaired tone, Decreased activity tolerance, Decreased endurance, Decreased strength, Decreased range of motion, Decreased balance, Difficulty walking, Impaired flexibility  Visit Diagnosis: Muscle weakness (generalized)  Other abnormalities of gait and mobility  Unsteadiness on feet     Problem List Patient Active Problem List   Diagnosis Date Noted  . Other constipation 01/17/2018  . Anemia, chronic disease 11/25/2017  . Goals of care, counseling/discussion 07/15/2017  . Genetic testing 08/17/2016  . Leukopenia due to antineoplastic chemotherapy ([place]) 06/05/2016  . Costochondritis, acute 05/02/2016  . Ovarian cancer ([place]) 03/24/2016  . Anemia due to antineoplastic chemotherapy 03/17/2016  . Port catheter in place 03/17/2016  . Portacath in place 02/05/2016  . Chemotherapy-induced peripheral neuropathy ([place]) 02/05/2016  . Hematoma 01/27/2016  . Chronic anticoagulation 01/22/2016  . Chronic atrial fibrillation ([place]) 01/22/2016  . PICC (peripherally inserted central catheter) in place 01/22/2016  . Chemotherapy induced neutropenia ([place]) 01/22/2016  . Poor venous access 01/22/2016  . CVA (cerebral vascular accident) ([place]) 12/27/2015  . Right hemiparesis ([place]) 12/27/2015  . Expressive dysphasia 12/27/2015  . History of left breast cancer 12/27/2015  . H/O tubal ligation 12/27/2015  . Colon polyps 12/27/2015  . International Federation of Gynecology and Obstetrics (FIGO) stage IVB epithelial ovarian cancer ([place]) 12/27/2015  . Ovarian cancer, bilateral ([place]) 12/24/2015  . Inguinal adenopathy   . Carcinomatosis ([place]) 12/06/2015  . Essential hypertension, benign 07/18/2014  . A-fib ([place]) 07/18/2014  . Anxiety state, unspecified 07/18/2014    Param Capri, PT 01/21/2018, 8:58 PM  [facility] [address] [place], [place], [address] Phone: [phone]   Fax:  [phone]  Name: Brenda Cunningham MRN: [id number] Date of Birth: 10-11-1951

## 2018-01-25 MED FILL — ZEJULA 100 MG CAPS: 100 | 30 days supply | Qty: 90 | Fill #2

## 2018-01-31 ENCOUNTER — Encounter: Payer: Self-pay | Admitting: Rehabilitation

## 2018-01-31 ENCOUNTER — Ambulatory Visit: Payer: Medicare Other | Admitting: Rehabilitation

## 2018-01-31 DIAGNOSIS — R2689 Other abnormalities of gait and mobility: Secondary | ICD-10-CM

## 2018-01-31 DIAGNOSIS — M6281 Muscle weakness (generalized): Secondary | ICD-10-CM | POA: Diagnosis not present

## 2018-01-31 DIAGNOSIS — R2681 Unsteadiness on feet: Secondary | ICD-10-CM

## 2018-01-31 NOTE — Patient Instructions (Signed)
Sit to Stand: Head Upright    Use sturdy firm chair and push against a wall before doing this exercise.  With head upright, stand up slowly.  Use left arm for support but lean forward!!!!!! Stand up tall and then lean forward and stick butt out when you are sitting.  Repeat _8-10___ times per session. Do _2___ sessions per day.  Copyright  VHI. All rights reserved.   HIP: Abduction - Standing    Squeeze glutes. Raise leg out and slightly back.  Do both sides! Stand tall and don't lean forward.  _10__ reps per set, _1-2_ sets per day, _5-7__ days per week Hold onto a support.  Copyright  VHI. All rights reserved.   Mini-Squats (Standing)    Stand with support. Bend knees slightly. Tighten pelvic floor. Hold for _2-3__ seconds. Return to straight standing. Relax for _5__ seconds. Repeat _10__ times. Do _2__ times a day.  Copyright  VHI. All rights reserved.

## 2018-02-01 NOTE — Therapy (Signed)
[facility] [address] [place], [place], [address] Phone: [phone]   Fax:  [phone]  Physical Therapy Treatment  Patient Details  Name: Brenda Cunningham MRN: [id number] Date of Birth: 03-02-1951 Referring Provider: Helane Rima, MD   Encounter Date: 01/31/2018  PT End of Session - 02/01/18 0759    Visit Number  2    Number of Visits  17 eval +16 visits    Date for PT Re-Evaluation  03/22/18    Authorization Type  medicare    PT Start Time  1147    PT Stop Time  1231    PT Time Calculation (min)  44 min    Equipment Utilized During Treatment  Gait belt    Activity Tolerance  Patient tolerated treatment well    Behavior During Therapy  [facility] for tasks assessed/performed       Past Medical History:  Diagnosis Date  . A-fib ([place])   . Anxiety   . Arthritis   . Breast cancer ([place]) 2008   s/p lumpectomy, chemo and radiation therapy  . Complication of anesthesia    DIFFICULTY WAKING UP FROM ANESTHESIA  . Hyperlipidemia   . Hypertension   . Stroke ([place]) 07/29/2015  . Wears glasses     Past Surgical History:  Procedure Laterality Date  . APPENDECTOMY    . BREAST LUMPECTOMY  2008   left  . COLONOSCOPY  2004  . DEBULKING N/A 03/24/2016   Procedure: RADICAL TUMOR DEBULKING;  Surgeon: Everitt Amber, MD;  [facility];  Service: Gynecology;  Laterality: N/A;  . OMENTECTOMY N/A 03/24/2016   Procedure: OMENTECTOMY;  Surgeon: Everitt Amber, MD;  [facility]: [facility];  Service: Gynecology;  Laterality: N/A;  . ROBOTIC ASSISTED TOTAL HYSTERECTOMY WITH BILATERAL SALPINGO OOPHERECTOMY Bilateral 03/24/2016   Procedure: XI ROBOTIC ASSISTED TOTA LAPAROSCOPIC  HYSTERECTOMY WITH BILATERAL SALPINGO OOPHORECTOMY;  Surgeon: Everitt Amber, MD;  [facility]: [facility];  Service: Gynecology;  Laterality: Bilateral;  . TUBAL LIGATION      There were no vitals filed for this visit.  Subjective Assessment - 01/31/18 1155    Subjective  Pt  reports no changes, no falls.  Wants to be walking more.     Patient is accompained by:  Family member Mack    Patient Stated Goals  Has not been able to get in/out of truck since beginning chemotherapy (months ago).     Currently in Pain?  No/denies                      [facility] Adult PT Treatment/Exercise - 01/31/18 1150      Transfers   Transfers  Sit to Stand;Stand to Sit    Sit to Stand  4: Min assist;3: Mod assist;5: Supervision    Sit to Stand Details  Verbal cues for sequencing;Verbal cues for technique;Verbal cues for precautions/safety;Manual facilitation for weight shifting;Manual facilitation for weight bearing    Stand to Sit  5: Supervision;4: Min assist    Stand to Sit Details (indicate cue type and reason)  Verbal cues for sequencing;Verbal cues for technique;Verbal cues for precautions/safety;Manual facilitation for weight shifting    Comments  Performed blocked practice of sit<>stand during session from varying heights on therapy mat to emphasize forward weight shift and equal WB.  Began with mat at elevated height and placed 2" block under LLE to prevent her from sliding LLE further back than RLE during transfer.  Performed from this height x 7 reps with  max verbal and demo cues as well as facilitation for forward weight shift over LEs when standing.  Lowered mat slightly and then performed x 7 more reps with cues and facilitation as above.  Then lowered mat one more time and placed RW in front of pt for visual cue to lean towards front of walker.  Performed x 5 reps with mod fading to min/guard A with continued cues and facilitation for forward weight shift.  Also had pt place UEs on large physioball and roll forward x 10 reps with 5 sec hold in max forward trunk lean position prior to performing sit<>stand x 3 more reps.  Pt making progress but needs continued practice.        Neuro Re-ed    Neuro Re-ed Details   For RLE NMR/strengthening had pt stand at counter top  and perform mini squats x 10 reps with chair behind pt for safety and cues to aim buttocks for chair for improved technique.  Also performed standing hip abd x 10 reps on each side.  Added these to HEP.              PT Education - 02/01/18 0758    Education provided  Yes    Education Details  exercises for [employer]) Educated  Patient;Other (comment) pts boyfriend    Methods  Explanation    Comprehension  Verbalized understanding       PT Short Term Goals - 01/21/18 2044      PT SHORT TERM GOAL #1   Title  The patient will perform HEP with assist from caregiver.      Time  4    Period  Weeks    Target Date  02/20/18      PT SHORT TERM GOAL #2   Title  The patient will improve Berg from 11/56 to > or equal to 16/56 to demonstrate improved steady state standing for ADLs.    Time  4    Period  Weeks    Target Date  02/20/18      PT SHORT TERM GOAL #3   Title  The patient will move sit<>stand from mat height surface with SBA.    Baseline  mod A from mat sit<>stand to RW.    Time  4    Period  Weeks    Target Date  02/20/18      PT SHORT TERM GOAL #4   Title  The patient will improve gait speed from 1.95 ft/sec to > or equal to 2.3 ft/sec to demonstrate improving functional mobility.    Time  4    Period  Weeks    Target Date  02/20/18      PT SHORT TERM GOAL #5   Title  The patient will ambulate x 400 feet nonstop with least restrictive assistive device mod indep for improved household mobility.    Time  4    Period  Weeks    Target Date  02/20/18        PT Long Term Goals - 01/21/18 2048      PT LONG TERM GOAL #1   Title  The patient will report eating meals at kitchen table 2 out of 3 meals/day to demo increasing walking in home for long term mobility.    Time  8    Period  Weeks    Target Date  03/22/18      PT LONG TERM GOAL #2   Title  The patient will improve Berg from 11/56 to > or equal to 20/56 to demonstrate improving static balance for  ADLs.    Time  8    Period  Weeks    Target Date  03/22/18      PT LONG TERM GOAL #3   Title  The patient will move sit<>stand from chair with arm rests and mat height surfaces mod indep to assistive device 5/5 times for improved mobility.    Time  8    Period  Weeks    Target Date  03/22/18      PT LONG TERM GOAL #4   Title  The patient will negotiate 4 steps mod indep with step to pattern descending for household entry/exit.    Time  8    Period  Weeks    Target Date  03/22/18      PT LONG TERM GOAL #5   Title  Pt will ambulate 562ft with LRAD and supervision outdoors over level and unlevel paved surfaces, ramps, and curbs to incr functional mobility and safety while ambulating in the community.    Time  8    Period  Weeks    Target Date  03/22/18            Plan - 01/31/18 1156    Clinical Impression Statement  Skilled session focused on blocked practice of sit<>stand for improved forward weight shift and equal WB in LEs.  Also provided pt with this for exercise at home along with standing mini squats and hip abd.  See pt instruction for details.      Rehab Potential  Good    Clinical Impairments Affecting Rehab Potential  inactive lifestyle, carryover of exercise to home    PT Frequency  2x / week eval +    PT Duration  8 weeks    PT Treatment/Interventions  ADLs/Self Care Home Management;Gait training;Stair training;Functional mobility training;Therapeutic activities;Therapeutic exercise;Patient/family education;Balance training    PT Next Visit Plan  Check compliance/performance of HEP, continue sit<>stand, forward weight shift,   Gait activities, LE strengthening, standing balance dec'ing UE support.    Consulted and Agree with Plan of Care  Patient       Patient will benefit from skilled therapeutic intervention in order to improve the following deficits and impairments:  Abnormal gait, Decreased mobility, Impaired tone, Decreased activity tolerance, Decreased  endurance, Decreased strength, Decreased range of motion, Decreased balance, Difficulty walking, Impaired flexibility  Visit Diagnosis: Muscle weakness (generalized)  Other abnormalities of gait and mobility  Unsteadiness on feet     Problem List Patient Active Problem List   Diagnosis Date Noted  . Other constipation 01/17/2018  . Anemia, chronic disease 11/25/2017  . Goals of care, counseling/discussion 07/15/2017  . Genetic testing 08/17/2016  . Leukopenia due to antineoplastic chemotherapy ([place]) 06/05/2016  . Costochondritis, acute 05/02/2016  . Ovarian cancer ([place]) 03/24/2016  . Anemia due to antineoplastic chemotherapy 03/17/2016  . Port catheter in place 03/17/2016  . Portacath in place 02/05/2016  . Chemotherapy-induced peripheral neuropathy ([place]) 02/05/2016  . Hematoma 01/27/2016  . Chronic anticoagulation 01/22/2016  . Chronic atrial fibrillation ([place]) 01/22/2016  . PICC (peripherally inserted central catheter) in place 01/22/2016  . Chemotherapy induced neutropenia ([place]) 01/22/2016  . Poor venous access 01/22/2016  . CVA (cerebral vascular accident) ([place]) 12/27/2015  . Right hemiparesis ([place]) 12/27/2015  . Expressive dysphasia 12/27/2015  . History of left breast cancer 12/27/2015  . H/O tubal ligation 12/27/2015  . Colon polyps  12/27/2015  . International Federation of Gynecology and Obstetrics (FIGO) stage IVB epithelial ovarian cancer ([place]) 12/27/2015  . Ovarian cancer, bilateral ([place]) 12/24/2015  . Inguinal adenopathy   . Carcinomatosis ([place]) 12/06/2015  . Essential hypertension, benign 07/18/2014  . A-fib ([place]) 07/18/2014  . Anxiety state, unspecified 07/18/2014    Cameron Sprang, PT, MPT [facility] [address] [place], [place], [address] Phone: [phone]   Fax:  [phone] 02/01/18, 8:01 AM  Name: Brenda Cunningham MRN: [id number] Date of Birth: 1951/09/03

## 2018-02-04 ENCOUNTER — Encounter: Payer: Self-pay | Admitting: Rehabilitation

## 2018-02-04 ENCOUNTER — Ambulatory Visit: Payer: Medicare Other | Attending: Family Medicine | Admitting: Rehabilitation

## 2018-02-04 DIAGNOSIS — I69353 Hemiplegia and hemiparesis following cerebral infarction affecting right non-dominant side: Secondary | ICD-10-CM | POA: Insufficient documentation

## 2018-02-04 DIAGNOSIS — M25611 Stiffness of right shoulder, not elsewhere classified: Secondary | ICD-10-CM | POA: Diagnosis present

## 2018-02-04 DIAGNOSIS — R2681 Unsteadiness on feet: Secondary | ICD-10-CM | POA: Diagnosis present

## 2018-02-04 DIAGNOSIS — R41844 Frontal lobe and executive function deficit: Secondary | ICD-10-CM | POA: Diagnosis present

## 2018-02-04 DIAGNOSIS — R2689 Other abnormalities of gait and mobility: Secondary | ICD-10-CM | POA: Diagnosis present

## 2018-02-04 DIAGNOSIS — M6281 Muscle weakness (generalized): Secondary | ICD-10-CM | POA: Diagnosis not present

## 2018-02-04 DIAGNOSIS — M25641 Stiffness of right hand, not elsewhere classified: Secondary | ICD-10-CM | POA: Diagnosis present

## 2018-02-04 NOTE — Therapy (Signed)
[facility] [address] [place], [place], [address] Phone: [phone]   Fax:  [phone]  Physical Therapy Treatment  Patient Details  Name: Brenda Cunningham MRN: [id number] Date of Birth: 1951-01-06 Referring Provider: Helane Rima, MD   Encounter Date: 02/04/2018  PT End of Session - 02/04/18 1301    Visit Number  3    Number of Visits  17 eval +16 visits    Date for PT Re-Evaluation  03/22/18    Authorization Type  medicare    PT Start Time  1101    PT Stop Time  1145    PT Time Calculation (min)  44 min    Equipment Utilized During Treatment  Gait belt    Activity Tolerance  Patient tolerated treatment well    Behavior During Therapy  WFL for tasks assessed/performed       Past Medical History:  Diagnosis Date  . A-fib ([place])   . Anxiety   . Arthritis   . Breast cancer ([place]) 2008   s/p lumpectomy, chemo and radiation therapy  . Complication of anesthesia    DIFFICULTY WAKING UP FROM ANESTHESIA  . Hyperlipidemia   . Hypertension   . Stroke ([place]) 07/29/2015  . Wears glasses     Past Surgical History:  Procedure Laterality Date  . APPENDECTOMY    . BREAST LUMPECTOMY  2008   left  . COLONOSCOPY  2004  . DEBULKING N/A 03/24/2016   Procedure: RADICAL TUMOR DEBULKING;  Surgeon: Everitt Amber, MD;  Location: [facility];  Service: Gynecology;  Laterality: N/A;  . OMENTECTOMY N/A 03/24/2016   Procedure: OMENTECTOMY;  Surgeon: Everitt Amber, MD;  Location: [facility];  Service: Gynecology;  Laterality: N/A;  . ROBOTIC ASSISTED TOTAL HYSTERECTOMY WITH BILATERAL SALPINGO OOPHERECTOMY Bilateral 03/24/2016   Procedure: XI ROBOTIC ASSISTED TOTA LAPAROSCOPIC  HYSTERECTOMY WITH BILATERAL SALPINGO OOPHORECTOMY;  Surgeon: Everitt Amber, MD;  Location: [facility];  Service: Gynecology;  Laterality: Bilateral;  . TUBAL LIGATION      There were no vitals filed for this visit.  Subjective Assessment - 02/04/18 1106    Subjective  Pt  reports it is getting easier to get up.  No falls since last visit.     Patient Stated Goals  Has not been able to get in/out of truck since beginning chemotherapy (months ago).     Currently in Pain?  No/denies                      [facility] Adult PT Treatment/Exercise - 02/04/18 0001      Ambulation/Gait   Ambulation/Gait  Yes    Ambulation/Gait Assistance  5: Supervision    Ambulation/Gait Assistance Details  Had pt ambulate during session with RW to address quality and distance.  Pt able to ambulate for 2.30 mins consecutively before needing to sit and rest.  Educated on doing this at home whether it is keeping track of laps that she performs from front of house to back of house or keeping track of time.  This way she can increase time/distance to improve endurance.  Also assessed safety with use of SBQC x 100'.  Note pt able to ambulate at S level, however did note increased tightness in RUE with use of cane.  Educated on benefits of ambulating with RW vs cane due to increased tone. Pt verbalized understanding.     Assistive device  Rolling walker    Gait Pattern  Decreased stride length;Decreased weight shift  to right;Decreased dorsiflexion - right    Stairs  Yes    Stairs Assistance  5: Supervision    Stairs Assistance Details (indicate cue type and reason)  Performed stairs x 3 reps today for BLE strengthening.  Pt able to perform in alternating fashion when ascending, but step to when descending.      Stair Management Technique  One rail Left;Alternating pattern;Step to pattern;Forwards    Number of Stairs  4 x 3 reps    Height of Stairs  6      Self-Care   Self-Care  Other Self-Care Comments    Other Self-Care Comments   Pt reports she will have daughter contact MD about OT order.  PT provided pt with this clinic's fax number so her MDs clinic can fax order here.       Therapeutic Activites    Therapeutic Activities  Other Therapeutic Activities    Other Therapeutic  Activities  Attempted to simulate getting into boyfriend's truck during session with elevated mat height and stepping to 10" step.  Problem solved pt being able to back up to seat and scoot posteriorly into seat vs stepping up using grab bar vs using step stool.  Feel that pt will be safest backing up to seat with step stool.  Pt to have boyfriend bring her next visit and bring her step stool from home to practice during session.       Neuro Re-ed    Neuro Re-ed Details   Reviewed counter top HEP.  Performed standing hip abd x 10 reps on each side.  Pt needing max cues for correct technique (on squats as well).  Performed mini squats x 10 reps again with max cues for technique.  Even placed chair behind pt with cushion in chair to have her touch to cushion as visual cue.  Will continue to practice during session.               PT Education - 02/04/18 1301    Education provided  Yes    Education Details  Review of HEP    Person(s) Educated  Patient    Methods  Explanation;Demonstration;Verbal cues    Comprehension  Verbalized understanding;Verbal cues required;Need further instruction       PT Short Term Goals - 01/21/18 2044      PT SHORT TERM GOAL #1   Title  The patient will perform HEP with assist from caregiver.      Time  4    Period  Weeks    Target Date  02/20/18      PT SHORT TERM GOAL #2   Title  The patient will improve Berg from 11/56 to > or equal to 16/56 to demonstrate improved steady state standing for ADLs.    Time  4    Period  Weeks    Target Date  02/20/18      PT SHORT TERM GOAL #3   Title  The patient will move sit<>stand from mat height surface with SBA.    Baseline  mod A from mat sit<>stand to RW.    Time  4    Period  Weeks    Target Date  02/20/18      PT SHORT TERM GOAL #4   Title  The patient will improve gait speed from 1.95 ft/sec to > or equal to 2.3 ft/sec to demonstrate improving functional mobility.    Time  4    Period  Weeks  Target  Date  02/20/18      PT SHORT TERM GOAL #5   Title  The patient will ambulate x 400 feet nonstop with least restrictive assistive device mod indep for improved household mobility.    Time  4    Period  Weeks    Target Date  02/20/18        PT Long Term Goals - 01/21/18 2048      PT LONG TERM GOAL #1   Title  The patient will report eating meals at kitchen table 2 out of 3 meals/day to demo increasing walking in home for long term mobility.    Time  8    Period  Weeks    Target Date  03/22/18      PT LONG TERM GOAL #2   Title  The patient will improve Berg from 11/56 to > or equal to 20/56 to demonstrate improving static balance for ADLs.    Time  8    Period  Weeks    Target Date  03/22/18      PT LONG TERM GOAL #3   Title  The patient will move sit<>stand from chair with arm rests and mat height surfaces mod indep to assistive device 5/5 times for improved mobility.    Time  8    Period  Weeks    Target Date  03/22/18      PT LONG TERM GOAL #4   Title  The patient will negotiate 4 steps mod indep with step to pattern descending for household entry/exit.    Time  8    Period  Weeks    Target Date  03/22/18      PT LONG TERM GOAL #5   Title  Pt will ambulate 544ft with LRAD and supervision outdoors over level and unlevel paved surfaces, ramps, and curbs to incr functional mobility and safety while ambulating in the community.    Time  8    Period  Weeks    Target Date  03/22/18            Plan - 02/04/18 1302    Clinical Impression Statement  Skilled session reviewed current HEP in which pt needed max cues for standing exercises.  Note improvement with sit<>stand. Also continue to focus on gait with RW for improved endurance, stair training for improved BLE strength and began to assess gait with SBQC. Note increased flexor tone when using quad cane, but overall she is safe over indoor surfaces. Will continue to address in session.  Note that we also attempted to  simulate getting in/out of her boyfriends truck which is very high and she is to have him bring her to next session to practice.     Rehab Potential  Good    Clinical Impairments Affecting Rehab Potential  inactive lifestyle, carryover of exercise to home    PT Frequency  2x / week eval +    PT Duration  8 weeks    PT Treatment/Interventions  ADLs/Self Care Home Management;Gait training;Stair training;Functional mobility training;Therapeutic activities;Therapeutic exercise;Patient/family education;Balance training    PT Next Visit Plan  Go out to boyfriends truck and problem solve getting in/out (she is supposed to bring step stool but if she didn't take one out there), Check compliance/performance of HEP, continue sit<>stand, forward weight shift,   Gait activities, LE strengthening, standing balance dec'ing UE support.    Consulted and Agree with Plan of Care  Patient  Patient will benefit from skilled therapeutic intervention in order to improve the following deficits and impairments:  Abnormal gait, Decreased mobility, Impaired tone, Decreased activity tolerance, Decreased endurance, Decreased strength, Decreased range of motion, Decreased balance, Difficulty walking, Impaired flexibility  Visit Diagnosis: Muscle weakness (generalized)  Other abnormalities of gait and mobility  Unsteadiness on feet     Problem List Patient Active Problem List   Diagnosis Date Noted  . Other constipation 01/17/2018  . Anemia, chronic disease 11/25/2017  . Goals of care, counseling/discussion 07/15/2017  . Genetic testing 08/17/2016  . Leukopenia due to antineoplastic chemotherapy ([place]) 06/05/2016  . Costochondritis, acute 05/02/2016  . Ovarian cancer ([place]) 03/24/2016  . Anemia due to antineoplastic chemotherapy 03/17/2016  . Port catheter in place 03/17/2016  . Portacath in place 02/05/2016  . Chemotherapy-induced peripheral neuropathy ([place]) 02/05/2016  . Hematoma 01/27/2016  . Chronic  anticoagulation 01/22/2016  . Chronic atrial fibrillation ([place]) 01/22/2016  . PICC (peripherally inserted central catheter) in place 01/22/2016  . Chemotherapy induced neutropenia ([place]) 01/22/2016  . Poor venous access 01/22/2016  . CVA (cerebral vascular accident) ([place]) 12/27/2015  . Right hemiparesis ([place]) 12/27/2015  . Expressive dysphasia 12/27/2015  . History of left breast cancer 12/27/2015  . H/O tubal ligation 12/27/2015  . Colon polyps 12/27/2015  . International Federation of Gynecology and Obstetrics (FIGO) stage IVB epithelial ovarian cancer ([place]) 12/27/2015  . Ovarian cancer, bilateral ([place]) 12/24/2015  . Inguinal adenopathy   . Carcinomatosis ([place]) 12/06/2015  . Essential hypertension, benign 07/18/2014  . A-fib ([place]) 07/18/2014  . Anxiety state, unspecified 07/18/2014    Cameron Sprang, PT, MPT [facility] [address] [place], [place], [address] Phone: [phone]   Fax:  [phone] 02/04/18, 1:05 PM  Name: Brenda Cunningham MRN: [id number] Date of Birth: September 12, 1951

## 2018-02-07 ENCOUNTER — Encounter: Payer: Self-pay | Admitting: Rehabilitation

## 2018-02-07 ENCOUNTER — Ambulatory Visit: Payer: Medicare Other | Admitting: Rehabilitation

## 2018-02-07 DIAGNOSIS — M6281 Muscle weakness (generalized): Secondary | ICD-10-CM

## 2018-02-07 DIAGNOSIS — R2689 Other abnormalities of gait and mobility: Secondary | ICD-10-CM

## 2018-02-07 DIAGNOSIS — R2681 Unsteadiness on feet: Secondary | ICD-10-CM

## 2018-02-07 NOTE — Therapy (Signed)
[facility] [address] [place], [place], [address] Phone: [phone]   Fax:  [phone]  Physical Therapy Treatment  Patient Details  Name: Brenda Cunningham MRN: [id number] Date of Birth: [birth year] Referring Provider: Helane Rima, MD   Encounter Date: 02/07/2018  PT End of Session - 02/07/18 1134    Visit Number  4    Number of Visits  17 eval +16 visits    Date for PT Re-Evaluation  03/22/18    Authorization Type  medicare    PT Start Time  0933    PT Stop Time  1015    PT Time Calculation (min)  42 min    Equipment Utilized During Treatment  Gait belt    Activity Tolerance  Patient tolerated treatment well    Behavior During Therapy  [facility] for tasks assessed/performed       Past Medical History:  Diagnosis Date  . A-fib ([place])   . Anxiety   . Arthritis   . Breast cancer ([place]) 2008   s/p lumpectomy, chemo and radiation therapy  . Complication of anesthesia    DIFFICULTY WAKING UP FROM ANESTHESIA  . Hyperlipidemia   . Hypertension   . Stroke ([place]) 07/29/2015  . Wears glasses     Past Surgical History:  Procedure Laterality Date  . APPENDECTOMY    . BREAST LUMPECTOMY  2008   left  . COLONOSCOPY  2004  . DEBULKING N/A 03/24/2016   Procedure: RADICAL TUMOR DEBULKING;  Surgeon: Everitt Amber, MD;  Location: [facility];  Service: Gynecology;  Laterality: N/A;  . OMENTECTOMY N/A 03/24/2016   Procedure: OMENTECTOMY;  Surgeon: Everitt Amber, MD;  Location: [facility];  Service: Gynecology;  Laterality: N/A;  . ROBOTIC ASSISTED TOTAL HYSTERECTOMY WITH BILATERAL SALPINGO OOPHERECTOMY Bilateral 03/24/2016   Procedure: XI ROBOTIC ASSISTED TOTA LAPAROSCOPIC  HYSTERECTOMY WITH BILATERAL SALPINGO OOPHORECTOMY;  Surgeon: Everitt Amber, MD;  Location: [facility];  Service: Gynecology;  Laterality: Bilateral;  . TUBAL LIGATION      There were no vitals filed for this visit.  Subjective Assessment - 02/07/18 0945    Subjective  Pt  reports no changes since last visit.      Patient Stated Goals  Has not been able to get in/out of truck since beginning chemotherapy (months ago).     Currently in Pain?  No/denies                      [facility] Adult PT Treatment/Exercise - 02/07/18 0946      Ambulation/Gait   Ambulation/Gait  Yes    Ambulation/Gait Assistance  5: Supervision;6: Modified independent (Device/Increase time)    Ambulation/Gait Assistance Details  Pt ambulated throughout session (including level outdoor paved surfaces) with RW (HO) at S to mod I level.  Note marked improvement/carryover with upright posture and closeness to RW since last session.     Ambulation Distance (Feet)  300 Feet    Assistive device  Rolling walker    Gait Pattern  Decreased stride length;Decreased weight shift to right;Decreased dorsiflexion - right      Therapeutic Activites    Therapeutic Activities  Other Therapeutic Activities    Other Therapeutic Activities  Went out to pts boyfriends SUV in order to work on safe entry/exit of vehicle.  Pt had her own personal step stool (approx 5") therefore had her back up to step, step up backwards with use of RW and reach back to seat.  Pt able to  perform x 2 reps with cues for sitting as far back as possible for safety.  Boyfriend states they will continue to practice this.        Neuro Re-ed    Neuro Re-ed Details   Performed step ups/down x 10 reps in // bars with single UE support, side step ups/down x 10 reps with single UE support.  Cone taps alternating R and L LE x 2 taps before returning to ground x 8 reps on R LE then LLE.  Cues for lighter taps and slower reps for increased SLS. Progressed to standing on foam balance beam maintaining balance (standing perpendicular) x 2 sets of 30 secs without UE support with cues for improved hip extension.  Performed step overs with R LE in stance on foam balance beam x 10 reps with LUE support, however noted heavy UE support therefore had  UEs support on PT.  Performed x 10 reps.  Ended with LLE in stance advancing RLE over and back x 10 reps.  Again pt needing UE support and cues for increased R hip flex to complete task. Pt with increased fatigue and needing rest breaks in between most tasks.              PT Education - 02/07/18 1133    Education provided  Yes    Education Details  safety with getting in/out of boyfriends SUV    Person(s) Educated  Patient;Other (comment) boyfriend    Methods  Explanation;Demonstration    Comprehension  Verbalized understanding;Returned demonstration       PT Short Term Goals - 01/21/18 2044      PT SHORT TERM GOAL #1   Title  The patient will perform HEP with assist from caregiver.      Time  4    Period  Weeks    Target Date  02/20/18      PT SHORT TERM GOAL #2   Title  The patient will improve Berg from 11/56 to > or equal to 16/56 to demonstrate improved steady state standing for ADLs.    Time  4    Period  Weeks    Target Date  02/20/18      PT SHORT TERM GOAL #3   Title  The patient will move sit<>stand from mat height surface with SBA.    Baseline  mod A from mat sit<>stand to RW.    Time  4    Period  Weeks    Target Date  02/20/18      PT SHORT TERM GOAL #4   Title  The patient will improve gait speed from 1.95 ft/sec to > or equal to 2.3 ft/sec to demonstrate improving functional mobility.    Time  4    Period  Weeks    Target Date  02/20/18      PT SHORT TERM GOAL #5   Title  The patient will ambulate x 400 feet nonstop with least restrictive assistive device mod indep for improved household mobility.    Time  4    Period  Weeks    Target Date  02/20/18        PT Long Term Goals - 01/21/18 2048      PT LONG TERM GOAL #1   Title  The patient will report eating meals at kitchen table 2 out of 3 meals/day to demo increasing walking in home for long term mobility.    Time  8    Period  Weeks  Target Date  03/22/18      PT LONG TERM GOAL #2    Title  The patient will improve Berg from 11/56 to > or equal to 20/56 to demonstrate improving static balance for ADLs.    Time  8    Period  Weeks    Target Date  03/22/18      PT LONG TERM GOAL #3   Title  The patient will move sit<>stand from chair with arm rests and mat height surfaces mod indep to assistive device 5/5 times for improved mobility.    Time  8    Period  Weeks    Target Date  03/22/18      PT LONG TERM GOAL #4   Title  The patient will negotiate 4 steps mod indep with step to pattern descending for household entry/exit.    Time  8    Period  Weeks    Target Date  03/22/18      PT LONG TERM GOAL #5   Title  Pt will ambulate 549ft with LRAD and supervision outdoors over level and unlevel paved surfaces, ramps, and curbs to incr functional mobility and safety while ambulating in the community.    Time  8    Period  Weeks    Target Date  03/22/18            Plan - 02/07/18 1134    Clinical Impression Statement  Skilled session focused on safety with getting in/out of her boyfriend's SUV due to elevated height.  Pt safe to perform with use of her personal step, backwards with RW.  Pt and boyfriend verbalized understanding and return demo.  Also focused on RLE (BLE) strengthening and WB with NMR tasks as well as high level balance on compliant surfaces for improved hip strategy activation.  Pt with marked fatigue and needing several seated rest breaks.      Rehab Potential  Good    Clinical Impairments Affecting Rehab Potential  inactive lifestyle, carryover of exercise to home    PT Frequency  2x / week eval +    PT Duration  8 weeks    PT Treatment/Interventions  ADLs/Self Care Home Management;Gait training;Stair training;Functional mobility training;Therapeutic activities;Therapeutic exercise;Patient/family education;Balance training    PT Next Visit Plan  Check compliance/performance of HEP, continue sit<>stand, forward weight shift,   Gait activities, LE  strengthening, standing balance dec'ing UE support.    Consulted and Agree with Plan of Care  Patient       Patient will benefit from skilled therapeutic intervention in order to improve the following deficits and impairments:  Abnormal gait, Decreased mobility, Impaired tone, Decreased activity tolerance, Decreased endurance, Decreased strength, Decreased range of motion, Decreased balance, Difficulty walking, Impaired flexibility  Visit Diagnosis: Muscle weakness (generalized)  Other abnormalities of gait and mobility  Unsteadiness on feet     Problem List Patient Active Problem List   Diagnosis Date Noted  . Other constipation 01/17/2018  . Anemia, chronic disease 11/25/2017  . Goals of care, counseling/discussion 07/15/2017  . Genetic testing 08/17/2016  . Leukopenia due to antineoplastic chemotherapy ([place]) 06/05/2016  . Costochondritis, acute 05/02/2016  . Ovarian cancer ([place]) 03/24/2016  . Anemia due to antineoplastic chemotherapy 03/17/2016  . Port catheter in place 03/17/2016  . Portacath in place 02/05/2016  . Chemotherapy-induced peripheral neuropathy ([place]) 02/05/2016  . Hematoma 01/27/2016  . Chronic anticoagulation 01/22/2016  . Chronic atrial fibrillation ([place]) 01/22/2016  . PICC (peripherally inserted central catheter) in  place 01/22/2016  . Chemotherapy induced neutropenia ([place]) 01/22/2016  . Poor venous access 01/22/2016  . CVA (cerebral vascular accident) ([place]) 12/27/2015  . Right hemiparesis ([place]) 12/27/2015  . Expressive dysphasia 12/27/2015  . History of left breast cancer 12/27/2015  . H/O tubal ligation 12/27/2015  . Colon polyps 12/27/2015  . International Federation of Gynecology and Obstetrics (FIGO) stage IVB epithelial ovarian cancer ([place]) 12/27/2015  . Ovarian cancer, bilateral ([place]) 12/24/2015  . Inguinal adenopathy   . Carcinomatosis ([place]) 12/06/2015  . Essential hypertension, benign 07/18/2014  . A-fib ([place]) 07/18/2014  . Anxiety state,  unspecified 07/18/2014    Cameron Sprang, PT, MPT [facility] [address] [place], [place], [address] Phone: [phone]   Fax:  [phone] 02/07/18, 11:37 AM  Name: Brenda Cunningham MRN: [id number] Date of Birth: [birth year]

## 2018-02-11 ENCOUNTER — Ambulatory Visit: Payer: Medicare Other | Admitting: Rehabilitative and Restorative Service Providers"

## 2018-02-11 DIAGNOSIS — R2689 Other abnormalities of gait and mobility: Secondary | ICD-10-CM

## 2018-02-11 DIAGNOSIS — M6281 Muscle weakness (generalized): Secondary | ICD-10-CM | POA: Diagnosis not present

## 2018-02-11 DIAGNOSIS — R2681 Unsteadiness on feet: Secondary | ICD-10-CM

## 2018-02-11 NOTE — Addendum Note (Signed)
Addended by: Rudell Cobb M on: 02/11/2018 12:25 PM   Modules accepted: Orders

## 2018-02-11 NOTE — Therapy (Signed)
[place] [facility] [address] [place], [place], [address] Phone: [phone]   Fax:  [phone]  Physical Therapy Treatment  Patient Details  Name: Brenda Cunningham MRN: [id number] Date of Birth: Jan 02, 1951 Referring Provider: Helane Rima, MD   Encounter Date: 02/11/2018  PT End of Session - 02/11/18 1218    Visit Number  5    Number of Visits  17 eval +16 visits    Date for PT Re-Evaluation  03/22/18    Authorization Type  medicare    PT Start Time  0850    PT Stop Time  0930    PT Time Calculation (min)  40 min    Equipment Utilized During Treatment  Gait belt    Activity Tolerance  Patient tolerated treatment well    Behavior During Therapy  [facility] for tasks assessed/performed       Past Medical History:  Diagnosis Date  . A-fib ([place])   . Anxiety   . Arthritis   . Breast cancer ([place]) 2008   s/p lumpectomy, chemo and radiation therapy  . Complication of anesthesia    DIFFICULTY WAKING UP FROM ANESTHESIA  . Hyperlipidemia   . Hypertension   . Stroke ([place]) 07/29/2015  . Wears glasses     Past Surgical History:  Procedure Laterality Date  . APPENDECTOMY    . BREAST LUMPECTOMY  2008   left  . COLONOSCOPY  2004  . DEBULKING N/A 03/24/2016   Procedure: RADICAL TUMOR DEBULKING;  Surgeon: Everitt Amber, MD;  Location: [facility];  Service: Gynecology;  Laterality: N/A;  . OMENTECTOMY N/A 03/24/2016   Procedure: OMENTECTOMY;  Surgeon: Everitt Amber, MD;  Location: [facility];  Service: Gynecology;  Laterality: N/A;  . ROBOTIC ASSISTED TOTAL HYSTERECTOMY WITH BILATERAL SALPINGO OOPHERECTOMY Bilateral 03/24/2016   Procedure: XI ROBOTIC ASSISTED TOTA LAPAROSCOPIC  HYSTERECTOMY WITH BILATERAL SALPINGO OOPHORECTOMY;  Surgeon: Everitt Amber, MD;  Location: [facility];  Service: Gynecology;  Laterality: Bilateral;  . TUBAL LIGATION      There were no vitals filed for this visit.  Subjective Assessment - 02/11/18 0853    Subjective  The  patient reports she is doing exercise at home.  She notes her walker needs new tennis balls.    Patient Stated Goals  Has not been able to get in/out of truck since beginning chemotherapy (months ago).     Currently in Pain?  No/denies                      [facility] Adult PT Treatment/Exercise - 02/11/18 0859      Transfers   Transfers  Sit to Stand    Comments  Worked on mechanics of sit<>stand emphasizing anterior lean to initiate movement.  Performed 10 times with cues on upright posture.      Ambulation/Gait   Ambulation/Gait  Yes    Ambulation/Gait Assistance  5: Supervision;4: Min guard    Ambulation/Gait Assistance Details  PT provided close supervision to CGA with one episode of R foot catching with RW, Patient also demonstrated gait with [facility] as she notes today this is what she uses for household mobility.  We performed 115 ft with one loss of balance that she self recovered from    Ambulation Distance (Feet)  460 Feet emphasizing greater endurance    Assistive device  Rolling walker;Small based quad cane    Gait Comments  Gait characterized by decreased right foot clearance and intermittent episodes of R toe catching.  Neuro Re-ed    Neuro Re-ed Details   Standing dec'ing UE support with CGA performing  eyes closed and then eyes open + head motion.        Exercises   Exercises  Other Exercises    Other Exercises   STANDING: Heel cord stretch with min A to facilitate R knee extension with heel remaining in contact with the floor.  In parallel bars for UE support.  Hold x 30 seconds x 2 reps right side.  Standing ball of foot right and then left supported on 2" surface for calf stretch.  Performed 1 rep each side.  Then performed right ankle DF in standing with feet in stride weight shifting anteriorly/posteriorly with emphasis on motor control R ankle.               PT Short Term Goals - 02/11/18 5643      PT SHORT TERM GOAL #1   Title  The patient  will perform HEP with assist from caregiver.      Time  4    Period  Weeks      PT SHORT TERM GOAL #2   Title  The patient will improve Berg from 11/56 to > or equal to 16/56 to demonstrate improved steady state standing for ADLs.    Time  4    Period  Weeks      PT SHORT TERM GOAL #3   Title  The patient will move sit<>stand from mat height surface with SBA.    Baseline  mod A from mat sit<>stand to RW at eval.  On 02/11/2018:  close supervision for sit<>stand x 10 reps.    Time  4    Period  Weeks    Status  Achieved      PT SHORT TERM GOAL #4   Title  The patient will improve gait speed from 1.95 ft/sec to > or equal to 2.3 ft/sec to demonstrate improving functional mobility.    Time  4    Period  Weeks      PT SHORT TERM GOAL #5   Title  The patient will ambulate x 400 feet nonstop with least restrictive assistive device mod indep for improved household mobility.    Time  4    Period  Weeks        PT Long Term Goals - 01/21/18 2048      PT LONG TERM GOAL #1   Title  The patient will report eating meals at kitchen table 2 out of 3 meals/day to demo increasing walking in home for long term mobility.    Time  8    Period  Weeks    Target Date  03/22/18      PT LONG TERM GOAL #2   Title  The patient will improve Berg from 11/56 to > or equal to 20/56 to demonstrate improving static balance for ADLs.    Time  8    Period  Weeks    Target Date  03/22/18      PT LONG TERM GOAL #3   Title  The patient will move sit<>stand from chair with arm rests and mat height surfaces mod indep to assistive device 5/5 times for improved mobility.    Time  8    Period  Weeks    Target Date  03/22/18      PT LONG TERM GOAL #4   Title  The patient will negotiate 4 steps mod indep with step  to pattern descending for household entry/exit.    Time  8    Period  Weeks    Target Date  03/22/18      PT LONG TERM GOAL #5   Title  Pt will ambulate 552ft with LRAD and supervision outdoors  over level and unlevel paved surfaces, ramps, and curbs to incr functional mobility and safety while ambulating in the community.    Time  8    Period  Weeks    Target Date  03/22/18            Plan - 02/11/18 1219    Clinical Impression Statement  The patient is limited in extended gait by dec'ing clearance of the right foot.  Unsure if she would be compliant with AFO use.  PT to assess with off the shelf brace in clinic-- asking patient to bring lace up tennis shoes (versus thin sketchers).    PT Treatment/Interventions  ADLs/Self Care Home Management;Gait training;Stair training;Functional mobility training;Therapeutic activities;Therapeutic exercise;Patient/family education;Balance training;Neuromuscular re-education;Orthotic Fit/Training    PT Next Visit Plan  Encouraged patient to bring lace up tennis shoes to possibly try R AFO to be able to extend gait with improved foot clearance.    Consulted and Agree with Plan of Care  Patient       Patient will benefit from skilled therapeutic intervention in order to improve the following deficits and impairments:  Abnormal gait, Decreased mobility, Impaired tone, Decreased activity tolerance, Decreased endurance, Decreased strength, Decreased range of motion, Decreased balance, Difficulty walking, Impaired flexibility  Visit Diagnosis: Muscle weakness (generalized)  Other abnormalities of gait and mobility  Unsteadiness on feet     Problem List Patient Active Problem List   Diagnosis Date Noted  . Other constipation 01/17/2018  . Anemia, chronic disease 11/25/2017  . Goals of care, counseling/discussion 07/15/2017  . Genetic testing 08/17/2016  . Leukopenia due to antineoplastic chemotherapy ([place]) 06/05/2016  . Costochondritis, acute 05/02/2016  . Ovarian cancer ([place]) 03/24/2016  . Anemia due to antineoplastic chemotherapy 03/17/2016  . Port catheter in place 03/17/2016  . Portacath in place 02/05/2016  .  Chemotherapy-induced peripheral neuropathy ([place]) 02/05/2016  . Hematoma 01/27/2016  . Chronic anticoagulation 01/22/2016  . Chronic atrial fibrillation ([employer]) 01/22/2016  . PICC (peripherally inserted central catheter) in place 01/22/2016  . Chemotherapy induced neutropenia ([place]) 01/22/2016  . Poor venous access 01/22/2016  . CVA (cerebral vascular accident) ([place]) 12/27/2015  . Right hemiparesis ([place]) 12/27/2015  . Expressive dysphasia 12/27/2015  . History of left breast cancer 12/27/2015  . H/O tubal ligation 12/27/2015  . Colon polyps 12/27/2015  . International Federation of Gynecology and Obstetrics (FIGO) stage IVB epithelial ovarian cancer ([place]) 12/27/2015  . Ovarian cancer, bilateral ([place]) 12/24/2015  . Inguinal adenopathy   . Carcinomatosis ([place]) 12/06/2015  . Essential hypertension, benign 07/18/2014  . A-fib ([place]) 07/18/2014  . Anxiety state, unspecified 07/18/2014    Abigal Choung, PT 02/11/2018, 12:20 PM  [place] [address] [place], [place], [address] Phone: [phone]   Fax:  [phone]  Name: Brenda Cunningham MRN: [id number] Date of Birth: October 16, 1951

## 2018-02-14 ENCOUNTER — Other Ambulatory Visit: Payer: Self-pay | Admitting: Hematology and Oncology

## 2018-02-14 ENCOUNTER — Ambulatory Visit: Payer: Medicare Other | Admitting: Rehabilitation

## 2018-02-14 ENCOUNTER — Encounter: Payer: Medicare Other | Admitting: *Deleted

## 2018-02-14 ENCOUNTER — Encounter: Payer: Self-pay | Admitting: Rehabilitation

## 2018-02-14 ENCOUNTER — Ambulatory Visit: Payer: Medicare Other | Admitting: Occupational Therapy

## 2018-02-14 DIAGNOSIS — M25611 Stiffness of right shoulder, not elsewhere classified: Secondary | ICD-10-CM

## 2018-02-14 DIAGNOSIS — M6281 Muscle weakness (generalized): Secondary | ICD-10-CM | POA: Diagnosis not present

## 2018-02-14 DIAGNOSIS — R2681 Unsteadiness on feet: Secondary | ICD-10-CM

## 2018-02-14 DIAGNOSIS — R2689 Other abnormalities of gait and mobility: Secondary | ICD-10-CM

## 2018-02-14 DIAGNOSIS — R41844 Frontal lobe and executive function deficit: Secondary | ICD-10-CM

## 2018-02-14 DIAGNOSIS — M25641 Stiffness of right hand, not elsewhere classified: Secondary | ICD-10-CM

## 2018-02-14 DIAGNOSIS — I69353 Hemiplegia and hemiparesis following cerebral infarction affecting right non-dominant side: Secondary | ICD-10-CM

## 2018-02-14 NOTE — Therapy (Signed)
[place] [address] [place], [place], [address] Phone: [phone]   Fax:  [phone]  Physical Therapy Treatment  Patient Details  Name: Brenda Cunningham MRN: [id number] Date of Birth: 12-Apr-1951 Referring Provider: Lendon Collar MD   Encounter Date: 02/14/2018  PT End of Session - 02/14/18 1231    Visit Number  6    Number of Visits  17 eval +16 visits    Date for PT Re-Evaluation  03/22/18    Authorization Type  medicare    PT Start Time  1016    PT Stop Time  1101    PT Time Calculation (min)  45 min    Equipment Utilized During Treatment  Gait belt    Activity Tolerance  Patient tolerated treatment well    Behavior During Therapy  [facility] for tasks assessed/performed       Past Medical History:  Diagnosis Date  . A-fib ([place])   . Anxiety   . Arthritis   . Breast cancer ([place]) 2008   s/p lumpectomy, chemo and radiation therapy  . Complication of anesthesia    DIFFICULTY WAKING UP FROM ANESTHESIA  . Hyperlipidemia   . Hypertension   . Stroke ([place]) 07/29/2015  . Wears glasses     Past Surgical History:  Procedure Laterality Date  . APPENDECTOMY    . BREAST LUMPECTOMY  2008   left  . COLONOSCOPY  2004  . DEBULKING N/A 03/24/2016   Procedure: RADICAL TUMOR DEBULKING;  Surgeon: Everitt Amber, MD;  [facility];  Service: Gynecology;  Laterality: N/A;  . OMENTECTOMY N/A 03/24/2016   Procedure: OMENTECTOMY;  Surgeon: Everitt Amber, MD;  [facility];  Service: Gynecology;  Laterality: N/A;  . ROBOTIC ASSISTED TOTAL HYSTERECTOMY WITH BILATERAL SALPINGO OOPHERECTOMY Bilateral 03/24/2016   Procedure: XI ROBOTIC ASSISTED TOTA LAPAROSCOPIC  HYSTERECTOMY WITH BILATERAL SALPINGO OOPHORECTOMY;  Surgeon: Everitt Amber, MD;  [facility];  Service: Gynecology;  Laterality: Bilateral;  . TUBAL LIGATION      There were no vitals filed for this visit.  Subjective Assessment - 02/14/18 1022    Subjective  Pt reports  she is using lift chair.   She was unaware that she wasn't supposed to use it.  This PT has discussed this with patient before.  Will continue to educate.     Patient Stated Goals  Has not been able to get in/out of truck since beginning chemotherapy (months ago).     Currently in Pain?  No/denies                      [facility] Adult PT Treatment/Exercise - 02/14/18 0001      Ambulation/Gait   Ambulation/Gait  Yes    Ambulation/Gait Assistance  5: Supervision;4: Min guard    Ambulation/Gait Assistance Details  Trialed use of PLS AFO despite pt not having lace up shoes.  Still encouraged her to bring lace up shoes at next session.   PLS did provide adequate support during all gait and mobiility with no evidence of R toe catching during session. Will continue to address in future sessions.     Ambulation Distance (Feet)  300 Feet    Assistive device  Rolling walker    Gait Pattern  Decreased stride length;Decreased weight shift to right;Decreased dorsiflexion - right    Ambulation Surface  Level;Indoor      Self-Care   Self-Care  Other Self-Care Comments    Other Self-Care Comments  Educated to decrease use of lift chair over the next week.  Recommend not elevating to full height and decreasing from there as we have been practicing sit<>stand to improve BLE strength and use of lift chair will prevent this.        Neuro Re-ed    Neuro Re-ed Details   Performed standing at RW initially elevating single UE assist from RW>lateral weight shifts while elevating same side UE (as best able on RUE)>stepping laterally with same UE elevation x 5 reps on each side.  Note decreased weight shift onto RLE, therefore had pt move to counter top for improved support and PT able to provide improved faciliatation.  Progressed to forward stepping with single UE support with emphasis on loading RLE when advancing RLE and maintaining RLE extension when advancing LLE.  Performed x 10 reps on each side.         Exercises   Other Exercises   Performed supine therex for improved core strength/decreasing LBP.  Performed supine posterior pelvic tilt x 10 reps with 5 sec holds, BLE bridging x 10 reps with cues for improved control and decreased speed, and hooklying marching x 2 sets of 10 reps.  See pt instruction.              PT Education - 02/14/18 1230    Education provided  Yes    Education Details  Educated on purpose of brace-bringing lace up shoes for better support with brace, also to decrease height of lift chair over the next week until she is getting out of chair at normal height.      Person(s) Educated  Patient;Other (comment) boyfriend    Methods  Explanation    Comprehension  Verbalized understanding       PT Short Term Goals - 02/11/18 0907      PT SHORT TERM GOAL #1   Title  The patient will perform HEP with assist from caregiver.      Time  4    Period  Weeks      PT SHORT TERM GOAL #2   Title  The patient will improve Berg from 11/56 to > or equal to 16/56 to demonstrate improved steady state standing for ADLs.    Time  4    Period  Weeks      PT SHORT TERM GOAL #3   Title  The patient will move sit<>stand from mat height surface with SBA.    Baseline  mod A from mat sit<>stand to RW at eval.  On 02/11/2018:  close supervision for sit<>stand x 10 reps.    Time  4    Period  Weeks    Status  Achieved      PT SHORT TERM GOAL #4   Title  The patient will improve gait speed from 1.95 ft/sec to > or equal to 2.3 ft/sec to demonstrate improving functional mobility.    Time  4    Period  Weeks      PT SHORT TERM GOAL #5   Title  The patient will ambulate x 400 feet nonstop with least restrictive assistive device mod indep for improved household mobility.    Time  4    Period  Weeks        PT Long Term Goals - 01/21/18 2048      PT LONG TERM GOAL #1   Title  The patient will report eating meals at kitchen table 2 out of 3 meals/day to demo increasing  walking in  home for long term mobility.    Time  8    Period  Weeks    Target Date  03/22/18      PT LONG TERM GOAL #2   Title  The patient will improve Berg from 11/56 to > or equal to 20/56 to demonstrate improving static balance for ADLs.    Time  8    Period  Weeks    Target Date  03/22/18      PT LONG TERM GOAL #3   Title  The patient will move sit<>stand from chair with arm rests and mat height surfaces mod indep to assistive device 5/5 times for improved mobility.    Time  8    Period  Weeks    Target Date  03/22/18      PT LONG TERM GOAL #4   Title  The patient will negotiate 4 steps mod indep with step to pattern descending for household entry/exit.    Time  8    Period  Weeks    Target Date  03/22/18      PT LONG TERM GOAL #5   Title  Pt will ambulate 554ft with LRAD and supervision outdoors over level and unlevel paved surfaces, ramps, and curbs to incr functional mobility and safety while ambulating in the community.    Time  8    Period  Weeks    Target Date  03/22/18            Plan - 02/14/18 1232    Clinical Impression Statement  Trialed use of PLS AFO today in sketchers shoe as she forgot to bring lace up shoe.  This did provide adequate support, however feel that it would be best to use with lace up shoe to provide optimal support.  Also continue to work on varying levels of standing balance with decreasing UE support.  Also provided education to decrease to not using lift chair at home.     PT Treatment/Interventions  ADLs/Self Care Home Management;Gait training;Stair training;Functional mobility training;Therapeutic activities;Therapeutic exercise;Patient/family education;Balance training;Neuromuscular re-education;Orthotic Fit/Training    PT Next Visit Plan  Give exercises from last session, Raquel Sarna forgot. Encouraged patient to bring lace up tennis shoes to possibly try R AFO to be able to extend gait with improved foot clearance.    Consulted and Agree with Plan of  Care  Patient       Patient will benefit from skilled therapeutic intervention in order to improve the following deficits and impairments:  Abnormal gait, Decreased mobility, Impaired tone, Decreased activity tolerance, Decreased endurance, Decreased strength, Decreased range of motion, Decreased balance, Difficulty walking, Impaired flexibility  Visit Diagnosis: Muscle weakness (generalized)  Other abnormalities of gait and mobility  Unsteadiness on feet     Problem List Patient Active Problem List   Diagnosis Date Noted  . Other constipation 01/17/2018  . Anemia, chronic disease 11/25/2017  . Goals of care, counseling/discussion 07/15/2017  . Genetic testing 08/17/2016  . Leukopenia due to antineoplastic chemotherapy ([place]) 06/05/2016  . Costochondritis, acute 05/02/2016  . Ovarian cancer ([place]) 03/24/2016  . Anemia due to antineoplastic chemotherapy 03/17/2016  . Port catheter in place 03/17/2016  . Portacath in place 02/05/2016  . Chemotherapy-induced peripheral neuropathy ([place]) 02/05/2016  . Hematoma 01/27/2016  . Chronic anticoagulation 01/22/2016  . Chronic atrial fibrillation ([place]) 01/22/2016  . PICC (peripherally inserted central catheter) in place 01/22/2016  . Chemotherapy induced neutropenia ([place]) 01/22/2016  . Poor venous access 01/22/2016  .  CVA (cerebral vascular accident) ([place]) 12/27/2015  . Right hemiparesis ([place]) 12/27/2015  . Expressive dysphasia 12/27/2015  . History of left breast cancer 12/27/2015  . H/O tubal ligation 12/27/2015  . Colon polyps 12/27/2015  . International Federation of Gynecology and Obstetrics (FIGO) stage IVB epithelial ovarian cancer ([place]) 12/27/2015  . Ovarian cancer, bilateral ([place]) 12/24/2015  . Inguinal adenopathy   . Carcinomatosis ([place]) 12/06/2015  . Essential hypertension, benign 07/18/2014  . A-fib ([place]) 07/18/2014  . Anxiety state, unspecified 07/18/2014    Cameron Sprang, PT, MPT [facility] [address] [place], [place], [address] Phone: [phone]   Fax:  [phone] 02/14/18, 12:42 PM  Name: Brenda Cunningham MRN: [id number] Date of Birth: 02/23/1951

## 2018-02-14 NOTE — Patient Instructions (Signed)
PELVIC TILT: Posterior    Tighten abdominals, flatten low back. __10_ reps per set (hold for 5 secs), __2_ sets per day, _5-7__ days per week   Copyright  VHI. All rights reserved.   Bridging    Slowly raise buttocks from floor, keeping stomach tight. Repeat _10__ times per set. Do _1___ sets per session. Do __2__ sessions per day.  http://orth.exer.us/1097   Copyright  VHI. All rights reserved.   PELVIC STABILIZATION: Single Foot Touch    Lie on back with knees bent.  Alternate marching (bringing knee towards your chest) one leg at a time, the other leg will be touching the bed.   Repeat _10__ times, alternating legs. Do _2__ times per day.  Copyright  VHI. All rights reserved.

## 2018-02-14 NOTE — Therapy (Signed)
[name] [address] [place], [place], [address] Phone: [phone]   Fax:  [phone]  Occupational Therapy Evaluation  Patient Details  Name: Brenda Cunningham MRN: [id number] Date of Birth: 03/13/1951 Referring Provider: Lendon Collar MD   Encounter Date: 02/14/2018  OT End of Session - 02/14/18 1311    Visit Number  1    Number of Visits  17    Date for OT Re-Evaluation  04/15/18    Authorization Type  Medicare    OT Start Time  0934    OT Stop Time  1005    OT Time Calculation (min)  31 min    Behavior During Therapy  [facility] for tasks assessed/performed       Past Medical History:  Diagnosis Date  . A-fib ([place])   . Anxiety   . Arthritis   . Breast cancer ([place]) 2008   s/p lumpectomy, chemo and radiation therapy  . Complication of anesthesia    DIFFICULTY WAKING UP FROM ANESTHESIA  . Hyperlipidemia   . Hypertension   . Stroke ([place]) 07/29/2015  . Wears glasses     Past Surgical History:  Procedure Laterality Date  . APPENDECTOMY    . BREAST LUMPECTOMY  2008   left  . COLONOSCOPY  2004  . DEBULKING N/A 03/24/2016   Procedure: RADICAL TUMOR DEBULKING;  Surgeon: Everitt Amber, MD;  Location: [facility];  Service: Gynecology;  Laterality: N/A;  . OMENTECTOMY N/A 03/24/2016   Procedure: OMENTECTOMY;  Surgeon: Everitt Amber, MD;  Location: [facility];  Service: Gynecology;  Laterality: N/A;  . ROBOTIC ASSISTED TOTAL HYSTERECTOMY WITH BILATERAL SALPINGO OOPHERECTOMY Bilateral 03/24/2016   Procedure: XI ROBOTIC ASSISTED TOTA LAPAROSCOPIC  HYSTERECTOMY WITH BILATERAL SALPINGO OOPHORECTOMY;  Surgeon: Everitt Amber, MD;  Location: [facility];  Service: Gynecology;  Laterality: Bilateral;  . TUBAL LIGATION      There were no vitals filed for this visit.  Subjective Assessment - 02/14/18 0938    Subjective   Pt s/p CVA in 2016 with right hemiplegia    Pertinent History  CVA 2016, hx of ovarian CA, s/p chemotherapy     Patient Stated  Goals  I want to work my hand    Currently in Pain?  No/denies        [facility] OT Assessment - 02/14/18 0942      Assessment   Medical Diagnosis  L MCA infarct/ R hemimotor impairments, chemotherapy from ovarian cancer    Referring Provider  Lendon Collar MD    Onset Date/Surgical Date  02/09/18      Precautions   Precautions  Fall      Restrictions   Weight Bearing Restrictions  No      Balance Screen   Has the patient fallen in the past 6 months  No      Home  Environment   Family/patient expects to be discharged to:  Private residence    Living Arrangements  Spouse/significant other    Available Help at Discharge  Available 24 hours/day    [place]  One level    Bathroom Shower/Tub  Tub/Shower unit has shower seat    Lives With  Spouse      Prior Function   Level of Independence  Needs assistance with ADLs;Needs assistance with homemaking;Independent with household mobility with device    Vocation  Retired    Comments  watches TV      ADL  Eating/Feeding  Needs assist with cutting food    Grooming  Modified independent    Upper Body Bathing  Minimal assistance    Lower Body Bathing  Minimal assistance    Upper Body Dressing  Minimal assistance needs assistance fastening bra    Lower Body Dressing  Minimal assistance    Toilet Transfer  Modified independent with RW    Tub/Shower Transfer  Supervision/safety    ADL comments  Pt reports using RUE lass than 10 % of the time as a stabilizer/ gross A      IADL   Shopping  Completely unable to shop    Light Housekeeping  Needs help with all home maintenance tasks    Meal Prep  Needs to have meals prepared and served    Medication Management  -- Pt's husband prepares medications    Financial Management  Dependent      Mobility   Mobility Status  -- modified indepednent with walker      Written Expression   Dominant Hand  Left      Vision Assessment   Vision Assessment  Vision not tested       Cognition   Overall Cognitive Status  Cognition to be further assessed in functional context PRN Pt denies cognitive changes      Sensation   Light Touch  Impaired by gross assessment for RUE      Coordination   Gross Motor Movements are Fluid and Coordinated  No    Fine Motor Movements are Fluid and Coordinated  No      Tone   Assessment Location  Right Upper Extremity      ROM / Strength   AROM / PROM / Strength  AROM      AROM   Overall AROM   Deficits    Overall AROM Comments  RUE shoulder flexion 65, abduction 80, elbow flexion/ extension: 120,/ -15, grossly 90% supination/ pronation, wrist flexion/ extension: 75%/25%, no active finger movement. RUE fisted in flexor synergy. LUE A/ROM  and arm strength grossly WFLS.      Hand Function   Right Hand Grip (lbs)  unable    Left Hand Grip (lbs)  45 lbs       RUE Tone   RUE Tone  Moderate;Hypertonic      RUE Tone   Hypertonic Details  moderate spasticity in hand                        OT Short Term Goals - 02/14/18 1307      OT SHORT TERM GOAL #1   Title  Pt will be mod I with HEP     Time  4    Period  Weeks    Status  New    Target Date  03/16/18      OT SHORT TERM GOAL #2   Title  Pt will verbalize understanding of RUE positioning to minimize risk for injury and contractures including splinting PRN.    Time  4    Period  Weeks    Status  New      OT SHORT TERM GOAL #3   Title  Pt will demonstrate 90* shoulder flexion for increased ease with ADLS/IADLs.    Time  4    Period  Weeks    Status  New      OT SHORT TERM GOAL #4   Title  Pt will be modified independent with all basic  ADLS    Time  4    Status  New        OT Long Term Goals - 02/14/18 1309      OT LONG TERM GOAL #1   Title  Pt will use RUE as a  stabilizer/ gross A 25 % of the time for ADLs/IADLS.-04/15/18    Time  8    Period  Weeks    Status  New    Target Date  04/15/18      OT LONG TERM GOAL #2   Title  Pt will  perform basic home management/ snack prep at a modified independent level.    Time  8    Period  Weeks    Status  New      OT LONG TERM GOAL #3   Title  Pt will verbalize understanding of AE/ DME to increase safety and ease with ADLS/IADLS.    Time  8    Period  Weeks    Status  New            Plan - 02/14/18 1311    Clinical Impression Statement  The patient is a 67 yo female known to our clinic from prior occupational therapy s/p CVA 07/2015 and chemotherapy due to cancer. Pt presents with the following deficits: RUE weakness, spasticity, cognitive deficits, decreased balance, decreased functional mobility which impede performance of ADLS/ IADLS Pt can benefit from skilled occupational therapy to maximize pt's safety and independence with daily activities.   Occupational Profile and client history currently impacting functional performance  Pt lives with her spouse, she is retired. PMH: A-fib, Anxiety, arthritis, breast CA and ovarian CA , s/p chemo and radiation, hyperlipidemia, HTN, CVA 2016    Occupational performance deficits (Please refer to evaluation for details):  ADL's;IADL's;Leisure;Social Participation    Rehab Potential  Good    Current Impairments/barriers affecting progress:  length of time since onset    OT Frequency  2x / week plus eval    OT Duration  8 weeks    OT Treatment/Interventions  Self-care/ADL training;Electrical Stimulation;Therapeutic exercise;Visual/perceptual remediation/compensation;Patient/family education;Splinting;Neuromuscular education;Moist Heat;Paraffin;Fluidtherapy;Energy conservation;Therapeutic activities;Balance training;Cognitive remediation/compensation;Passive range of motion;Manual Therapy;DME and/or AE instruction;Contrast Bath;Cryotherapy    Plan  HEP for RUE, resting hand splint    Clinical Decision Making  Limited treatment options, no task modification necessary    Consulted and Agree with Plan of Care  Patient       Patient will  benefit from skilled therapeutic intervention in order to improve the following deficits and impairments:  Abnormal gait, Decreased cognition, Decreased knowledge of use of DME, Impaired flexibility, Decreased mobility, Decreased coordination, Decreased activity tolerance, Decreased endurance, Decreased range of motion, Decreased strength, Impaired UE functional use, Impaired perceived functional ability, Decreased safety awareness, Decreased knowledge of precautions, Decreased balance, Difficulty walking  Visit Diagnosis: Muscle weakness (generalized) - Plan: Ot plan of care cert/re-cert  Hemiplegia and hemiparesis following cerebral infarction affecting right non-dominant side ([place]) - Plan: Ot plan of care cert/re-cert  Stiffness of right shoulder, not elsewhere classified - Plan: Ot plan of care cert/re-cert  Frontal lobe and executive function deficit - Plan: Ot plan of care cert/re-cert  Other abnormalities of gait and mobility - Plan: Ot plan of care cert/re-cert  Stiffness of right hand, not elsewhere classified - Plan: Ot plan of care cert/re-cert    Problem List Patient Active Problem List   Diagnosis Date Noted  . Other constipation 01/17/2018  . Anemia, chronic disease 11/25/2017  .  Goals of care, counseling/discussion 07/15/2017  . Genetic testing 08/17/2016  . Leukopenia due to antineoplastic chemotherapy ([place]) 06/05/2016  . Costochondritis, acute 05/02/2016  . Ovarian cancer ([place]) 03/24/2016  . Anemia due to antineoplastic chemotherapy 03/17/2016  . Port catheter in place 03/17/2016  . Portacath in place 02/05/2016  . Chemotherapy-induced peripheral neuropathy ([place]) 02/05/2016  . Hematoma 01/27/2016  . Chronic anticoagulation 01/22/2016  . Chronic atrial fibrillation ([place]) 01/22/2016  . PICC (peripherally inserted central catheter) in place 01/22/2016  . Chemotherapy induced neutropenia ([place]) 01/22/2016  . Poor venous access 01/22/2016  . CVA (cerebral vascular  accident) ([place]) 12/27/2015  . Right hemiparesis ([place]) 12/27/2015  . Expressive dysphasia 12/27/2015  . History of left breast cancer 12/27/2015  . H/O tubal ligation 12/27/2015  . Colon polyps 12/27/2015  . International Federation of Gynecology and Obstetrics (FIGO) stage IVB epithelial ovarian cancer ([place]) 12/27/2015  . Ovarian cancer, bilateral ([place]) 12/24/2015  . Inguinal adenopathy   . Carcinomatosis ([place]) 12/06/2015  . Essential hypertension, benign 07/18/2014  . A-fib ([place]) 07/18/2014  . Anxiety state, unspecified 07/18/2014    Beyonce Sawatzky 02/14/2018, 2:21 PM Theone Murdoch, OTR/L [phone] Phone: [phone] 2:21 PM 02/14/18 [facility] [address] [place], [place], [address] Phone: [phone]   Fax:  [phone]  Name: Vanisha Whiten MRN: [id number] Date of Birth: [birth year]

## 2018-02-17 ENCOUNTER — Telehealth: Payer: Self-pay

## 2018-02-17 ENCOUNTER — Other Ambulatory Visit: Payer: Self-pay

## 2018-02-17 MED ORDER — LACTULOSE 10 GM/15ML PO SOLN: 10.0000 g | Freq: Three times a day (TID) | ORAL | 0 refills | Status: DC | PRN

## 2018-02-17 MED FILL — ZEJULA 100 MG CAPS: 100 | 30 days supply | Qty: 90 | Fill #3

## 2018-02-17 NOTE — Telephone Encounter (Signed)
Miralax BID Sennokot 2 tabs TID Call in lactulose 10 ml TID Call back tomorrow for update

## 2018-02-17 NOTE — Telephone Encounter (Signed)
Called with below message. Daughter verbalized understanding. Rx sent to pharmacy.

## 2018-02-17 NOTE — Telephone Encounter (Signed)
Daughter Kenney Houseman called and left message. Her Mom is constipated and has not had a bm in 4 days. She is taking prune juice and Miralax. She is looking for other suggestions.

## 2018-02-18 ENCOUNTER — Ambulatory Visit: Payer: Medicare Other | Admitting: Rehabilitative and Restorative Service Providers"

## 2018-02-18 ENCOUNTER — Telehealth: Payer: Self-pay | Admitting: Rehabilitative and Restorative Service Providers"

## 2018-02-18 ENCOUNTER — Encounter: Payer: Self-pay | Admitting: Rehabilitative and Restorative Service Providers"

## 2018-02-18 DIAGNOSIS — M6281 Muscle weakness (generalized): Secondary | ICD-10-CM | POA: Diagnosis not present

## 2018-02-18 DIAGNOSIS — R2689 Other abnormalities of gait and mobility: Secondary | ICD-10-CM

## 2018-02-18 DIAGNOSIS — R2681 Unsteadiness on feet: Secondary | ICD-10-CM

## 2018-02-18 NOTE — Therapy (Signed)
[facility] [address] [place], [place], [address] Phone: [phone]   Fax:  [phone]  Physical Therapy Treatment  Patient Details  Name: Brenda Cunningham MRN: [id number] Date of Birth: 03/18/1951 Referring Provider: Lendon Collar MD   Encounter Date: 02/18/2018  PT End of Session - 02/18/18 0933    Visit Number  7    Number of Visits  17 eval +16 visits    Date for PT Re-Evaluation  03/22/18    Authorization Type  medicare    PT Start Time  0848    PT Stop Time  0930    PT Time Calculation (min)  42 min    Equipment Utilized During Treatment  Gait belt    Activity Tolerance  Patient tolerated treatment well    Behavior During Therapy  [facility] for tasks assessed/performed       Past Medical History:  Diagnosis Date  . A-fib ([place])   . Anxiety   . Arthritis   . Breast cancer ([place]) 2008   s/p lumpectomy, chemo and radiation therapy  . Complication of anesthesia    DIFFICULTY WAKING UP FROM ANESTHESIA  . Hyperlipidemia   . Hypertension   . Stroke ([place]) 07/29/2015  . Wears glasses     Past Surgical History:  Procedure Laterality Date  . APPENDECTOMY    . BREAST LUMPECTOMY  2008   left  . COLONOSCOPY  2004  . DEBULKING N/A 03/24/2016   Procedure: RADICAL TUMOR DEBULKING;  Surgeon: Everitt Amber, MD;  [facility];  Service: Gynecology;  Laterality: N/A;  . OMENTECTOMY N/A 03/24/2016   Procedure: OMENTECTOMY;  Surgeon: Everitt Amber, MD;  [facility];  Service: Gynecology;  Laterality: N/A;  . ROBOTIC ASSISTED TOTAL HYSTERECTOMY WITH BILATERAL SALPINGO OOPHERECTOMY Bilateral 03/24/2016   Procedure: XI ROBOTIC ASSISTED TOTA LAPAROSCOPIC  HYSTERECTOMY WITH BILATERAL SALPINGO OOPHORECTOMY;  Surgeon: Everitt Amber, MD;  [facility];  Service: Gynecology;  Laterality: Bilateral;  . TUBAL LIGATION      There were no vitals filed for this visit.  Subjective Assessment - 02/18/18 0847    Subjective  The  patient notes she got new shoes to use with the brace in therapy.    Patient Stated Goals  Has not been able to get in/out of truck since beginning chemotherapy (months ago).     Currently in Pain?  Yes    Pain Score  -- The patient notes a little bit.  Specific rating not provided- aphasia a barrier.    Pain Location  Back    Pain Orientation  Lower;Medial    Pain Type  Chronic pain    Pain Onset  More than a month ago    Pain Frequency  Intermittent    Aggravating Factors   poor posture    Pain Relieving Factors  standing up straight         [facility] PT Assessment - 02/18/18 0856      Ambulation/Gait   Ambulation/Gait  Yes    Ambulation/Gait Assistance  5: Supervision    Ambulation/Gait Assistance Details  Ambulated without AFO x 100 ft, then with AFO x 345 ft nonstop with patient c/o fatigue.  Pateint with PLS has minimal laterla whip noted.  Tried toe off R AFO x 345 feet.  *patient prefers anterior loading AFO.    Ambulation Distance (Feet)  345 Feet x 2    Assistive device  Rolling walker    Gait Pattern  Step-through pattern;Poor foot clearance -  right used AFO    Ambulation Surface  Level;Indoor    Gait velocity  2.56 ft/sec 2.60 ft/sec    Gait velocity - backwards       Stairs  Yes    Stairs Assistance  5: Supervision    Stair Management Technique  One rail Left;Alternating pattern;Step to pattern;Forwards    Number of Stairs  4      Berg Balance Test   Sit to Stand  Able to stand using hands after several tries    Standing Unsupported  Able to stand 2 minutes with supervision    Sitting with Back Unsupported but Feet Supported on Floor or Stool  Able to sit safely and securely 2 minutes    Stand to Sit  Uses backs of legs against chair to control descent    Transfers  Able to transfer with verbal cueing and /or supervision    Standing Unsupported with Eyes Closed  Able to stand 10 seconds with supervision    Standing Ubsupported with Feet Together  Able to place feet  together independently and stand for 1 minute with supervision    From Standing, Reach Forward with Outstretched Arm  Reaches forward but needs supervision    From Standing Position, Pick up Object from [place] to pick up shoe, needs supervision    From Standing Position, Turn to Look Behind Over each Shoulder  Needs supervision when turning    Turn 360 Degrees  Needs assistance while turning    Standing Unsupported, Alternately Place Feet on Step/Stool  Needs assistance to keep from falling or unable to try    Standing Unsupported, One Foot in Front  Able to take small step independently and hold 30 seconds    Standing on One Leg  Tries to lift leg/unable to hold 3 seconds but remains standing independently    Total Score  27    Berg comment:  27/56                            PT Short Term Goals - 02/18/18 2707      PT SHORT TERM GOAL #1   Title  The patient will perform HEP with assist from caregiver.      Time  4    Period  Weeks    Status  Achieved      PT SHORT TERM GOAL #2   Title  The patient will improve Berg from 11/56 to > or equal to 16/56 to demonstrate improved steady state standing for ADLs.    Baseline  27/56 on 02/18/18    Time  4    Period  Weeks    Status  Achieved      PT SHORT TERM GOAL #3   Title  The patient will move sit<>stand from mat height surface with SBA.    Baseline  mod A from mat sit<>stand to RW at eval.  On 02/11/2018:  close supervision for sit<>stand x 10 reps.    Time  4    Period  Weeks    Status  Achieved      PT SHORT TERM GOAL #4   Title  The patient will improve gait speed from 1.95 ft/sec to > or equal to 2.3 ft/sec to demonstrate improving functional mobility.    Baseline  2.58 ft/sec     Time  4    Period  Weeks    Status  Achieved  PT SHORT TERM GOAL #5   Title  The patient will ambulate x 400 feet nonstop with least restrictive assistive device mod indep for improved household mobility.    Baseline   345 ft nonstop with supervision today using RW.    Time  4    Period  Weeks    Status  Partially Met        PT Long Term Goals - 01/21/18 2048      PT LONG TERM GOAL #1   Title  The patient will report eating meals at kitchen table 2 out of 3 meals/day to demo increasing walking in home for long term mobility.    Time  8    Period  Weeks    Target Date  03/22/18      PT LONG TERM GOAL #2   Title  The patient will improve Berg from 11/56 to > or equal to 20/56 to demonstrate improving static balance for ADLs.    Time  8    Period  Weeks    Target Date  03/22/18      PT LONG TERM GOAL #3   Title  The patient will move sit<>stand from chair with arm rests and mat height surfaces mod indep to assistive device 5/5 times for improved mobility.    Time  8    Period  Weeks    Target Date  03/22/18      PT LONG TERM GOAL #4   Title  The patient will negotiate 4 steps mod indep with step to pattern descending for household entry/exit.    Time  8    Period  Weeks    Target Date  03/22/18      PT LONG TERM GOAL #5   Title  Pt will ambulate 594f with LRAD and supervision outdoors over level and unlevel paved surfaces, ramps, and curbs to incr functional mobility and safety while ambulating in the community.    Time  8    Period  Weeks    Target Date  03/22/18            Plan - 02/18/18 1040    Clinical Impression Statement  The patient met 4 LTGs and partially met 1 LTG.  She is progressing well with gait activities.  PT to request order for blue rocker R AFO to help improve foot clearance for extended ambulation with RW.      PT Treatment/Interventions  ADLs/Self Care Home Management;Gait training;Stair training;Functional mobility training;Therapeutic activities;Therapeutic exercise;Patient/family education;Balance training;Neuromuscular re-education;Orthotic Fit/Training    PT Next Visit Plan  Continue to LTGs, check on order for blue rocker, *GIVE exercisses written from  02/14/2018.  Gait with blue rocker, slower pace for greater distance.    Consulted and Agree with Plan of Care  Patient       Patient will benefit from skilled therapeutic intervention in order to improve the following deficits and impairments:  Abnormal gait, Decreased mobility, Impaired tone, Decreased activity tolerance, Decreased endurance, Decreased strength, Decreased range of motion, Decreased balance, Difficulty walking, Impaired flexibility  Visit Diagnosis: Muscle weakness (generalized)  Other abnormalities of gait and mobility  Unsteadiness on feet     Problem List Patient Active Problem List   Diagnosis Date Noted  . Other constipation 01/17/2018  . Anemia, chronic disease 11/25/2017  . Goals of care, counseling/discussion 07/15/2017  . Genetic testing 08/17/2016  . Leukopenia due to antineoplastic chemotherapy ([place] 06/05/2016  . Costochondritis, acute 05/02/2016  . Ovarian cancer ([place]  03/24/2016  . Anemia due to antineoplastic chemotherapy 03/17/2016  . Port catheter in place 03/17/2016  . Portacath in place 02/05/2016  . Chemotherapy-induced peripheral neuropathy ([place]) 02/05/2016  . Hematoma 01/27/2016  . Chronic anticoagulation 01/22/2016  . Chronic atrial fibrillation ([place]) 01/22/2016  . PICC (peripherally inserted central catheter) in place 01/22/2016  . Chemotherapy induced neutropenia ([place]) 01/22/2016  . Poor venous access 01/22/2016  . CVA (cerebral vascular accident) ([place]) 12/27/2015  . Right hemiparesis ([place]) 12/27/2015  . Expressive dysphasia 12/27/2015  . History of left breast cancer 12/27/2015  . H/O tubal ligation 12/27/2015  . Colon polyps 12/27/2015  . International Federation of Gynecology and Obstetrics (FIGO) stage IVB epithelial ovarian cancer ([place]) 12/27/2015  . Ovarian cancer, bilateral ([place]) 12/24/2015  . Inguinal adenopathy   . Carcinomatosis ([place]) 12/06/2015  . Essential hypertension, benign 07/18/2014  . A-fib ([place]) 07/18/2014   . Anxiety state, unspecified 07/18/2014    Nosson Wender, PT 02/18/2018, 10:42 AM  [facility] [address] [place], [place], [address] Phone: [phone]   Fax:  [phone]  Name: Brenda Cunningham MRN: [id number] Date of Birth: 1951-02-11

## 2018-02-18 NOTE — Telephone Encounter (Signed)
Dr. Lavone Neri, Brenda Cunningham is being treated by physical therapy for h/o CVA and R sided weakness.  They will benefit from use of right AFO in order to improve safety with functional mobility.    If you agree, please submit request in EPIC under referral for DME (list right blue rocker AFO in comments) or fax to [facility] at [phone].   Thank you, [place], [place] [address] [place], [place]  [address] Phone:  [phone] Fax:  [phone]

## 2018-02-21 ENCOUNTER — Ambulatory Visit: Payer: Medicare Other | Admitting: Rehabilitation

## 2018-02-22 ENCOUNTER — Inpatient Hospital Stay: Payer: Medicare Other

## 2018-02-22 ENCOUNTER — Encounter (HOSPITAL_COMMUNITY): Payer: Self-pay

## 2018-02-22 ENCOUNTER — Inpatient Hospital Stay: Payer: Medicare Other | Attending: Hematology and Oncology

## 2018-02-22 ENCOUNTER — Ambulatory Visit (HOSPITAL_COMMUNITY)
Admission: RE | Admit: 2018-02-22 | Discharge: 2018-02-22 | Disposition: A | Discharge status: Home / Self Care | Payer: Medicare Other | Source: Ambulatory Visit | Attending: Hematology and Oncology | Admitting: Hematology and Oncology

## 2018-02-22 DIAGNOSIS — I739 Peripheral vascular disease, unspecified: Secondary | ICD-10-CM | POA: Diagnosis not present

## 2018-02-22 DIAGNOSIS — Z90710 Acquired absence of both cervix and uterus: Secondary | ICD-10-CM | POA: Diagnosis not present

## 2018-02-22 DIAGNOSIS — D1803 Hemangioma of intra-abdominal structures: Secondary | ICD-10-CM | POA: Diagnosis not present

## 2018-02-22 DIAGNOSIS — G620 Drug-induced polyneuropathy: Secondary | ICD-10-CM | POA: Insufficient documentation

## 2018-02-22 DIAGNOSIS — C562 Malignant neoplasm of left ovary: Secondary | ICD-10-CM | POA: Diagnosis not present

## 2018-02-22 DIAGNOSIS — I700 Atherosclerosis of aorta: Secondary | ICD-10-CM | POA: Diagnosis not present

## 2018-02-22 DIAGNOSIS — C786 Secondary malignant neoplasm of retroperitoneum and peritoneum: Secondary | ICD-10-CM | POA: Diagnosis not present

## 2018-02-22 DIAGNOSIS — Z79899 Other long term (current) drug therapy: Secondary | ICD-10-CM | POA: Diagnosis not present

## 2018-02-22 DIAGNOSIS — Z8673 Personal history of transient ischemic attack (TIA), and cerebral infarction without residual deficits: Secondary | ICD-10-CM | POA: Diagnosis not present

## 2018-02-22 DIAGNOSIS — I482 Chronic atrial fibrillation: Secondary | ICD-10-CM | POA: Insufficient documentation

## 2018-02-22 DIAGNOSIS — Z9221 Personal history of antineoplastic chemotherapy: Secondary | ICD-10-CM | POA: Diagnosis not present

## 2018-02-22 DIAGNOSIS — Z7982 Long term (current) use of aspirin: Secondary | ICD-10-CM | POA: Diagnosis not present

## 2018-02-22 DIAGNOSIS — Z90722 Acquired absence of ovaries, bilateral: Secondary | ICD-10-CM | POA: Insufficient documentation

## 2018-02-22 DIAGNOSIS — D649 Anemia, unspecified: Secondary | ICD-10-CM | POA: Insufficient documentation

## 2018-02-22 DIAGNOSIS — C7951 Secondary malignant neoplasm of bone: Secondary | ICD-10-CM | POA: Insufficient documentation

## 2018-02-22 DIAGNOSIS — T451X5S Adverse effect of antineoplastic and immunosuppressive drugs, sequela: Secondary | ICD-10-CM | POA: Diagnosis not present

## 2018-02-22 DIAGNOSIS — N2889 Other specified disorders of kidney and ureter: Secondary | ICD-10-CM | POA: Insufficient documentation

## 2018-02-22 DIAGNOSIS — Z7901 Long term (current) use of anticoagulants: Secondary | ICD-10-CM | POA: Diagnosis not present

## 2018-02-22 DIAGNOSIS — Z95828 Presence of other vascular implants and grafts: Secondary | ICD-10-CM

## 2018-02-22 DIAGNOSIS — R918 Other nonspecific abnormal finding of lung field: Secondary | ICD-10-CM | POA: Diagnosis not present

## 2018-02-22 DIAGNOSIS — C563 Malignant neoplasm of bilateral ovaries: Secondary | ICD-10-CM

## 2018-02-22 DIAGNOSIS — C561 Malignant neoplasm of right ovary: Secondary | ICD-10-CM

## 2018-02-22 LAB — CBC WITH DIFFERENTIAL/PLATELET
Basophils Absolute: 0 10*3/uL (ref 0.0–0.1)
Basophils Relative: 0 %
Eosinophils Absolute: 0 10*3/uL (ref 0.0–0.5)
Eosinophils Relative: 1 %
HCT: 32.9 % — ABNORMAL LOW (ref 34.8–46.6)
Hemoglobin: 11.3 g/dL — ABNORMAL LOW (ref 11.6–15.9)
Lymphocytes Relative: 39 %
Lymphs Abs: 1.9 10*3/uL (ref 0.9–3.3)
MCH: 39.1 pg — ABNORMAL HIGH (ref 25.1–34.0)
MCHC: 34.3 g/dL (ref 31.5–36.0)
MCV: 113.8 fL — ABNORMAL HIGH (ref 79.5–101.0)
Monocytes Absolute: 0.5 10*3/uL (ref 0.1–0.9)
Monocytes Relative: 10 %
Neutro Abs: 2.4 10*3/uL (ref 1.5–6.5)
Neutrophils Relative %: 50 %
Platelets: 212 10*3/uL (ref 145–400)
RBC: 2.89 MIL/uL — ABNORMAL LOW (ref 3.70–5.45)
RDW: 18.7 % — ABNORMAL HIGH (ref 11.2–14.5)
WBC: 4.8 10*3/uL (ref 3.9–10.3)
nRBC: 1 /100 WBC — ABNORMAL HIGH

## 2018-02-22 LAB — COMPREHENSIVE METABOLIC PANEL
ALT: 15 U/L (ref 0–55)
AST: 18 U/L (ref 5–34)
Albumin: 3.6 g/dL (ref 3.5–5.0)
Alkaline Phosphatase: 135 U/L (ref 40–150)
Anion gap: 7 (ref 3–11)
BUN: 10 mg/dL (ref 7–26)
CO2: 27 mmol/L (ref 22–29)
Calcium: 9.6 mg/dL (ref 8.4–10.4)
Chloride: 106 mmol/L (ref 98–109)
Creatinine, Ser: 0.86 mg/dL (ref 0.60–1.10)
GFR calc Af Amer: >60 mL/min (ref >60)
GFR calc non Af Amer: >60 mL/min (ref >60)
Glucose, Bld: 95 mg/dL (ref 70–140)
Potassium: 3.9 mmol/L (ref 3.5–5.1)
Sodium: 140 mmol/L (ref 136–145)
Total Bilirubin: 0.6 mg/dL (ref 0.2–1.2)
Total Protein: 7.2 g/dL (ref 6.4–8.3)

## 2018-02-22 MED ORDER — HEPARIN SOD (PORK) LOCK FLUSH 100 UNIT/ML IV SOLN
INTRAVENOUS | Status: AC
  Filled 2018-02-22: qty 5

## 2018-02-22 MED ORDER — IOPAMIDOL (ISOVUE-300) INJECTION 61%
100.0000 mL | Freq: Once | INTRAVENOUS | Status: AC | PRN
  Administered 2018-02-22: 100 mL via INTRAVENOUS

## 2018-02-22 MED ORDER — SODIUM CHLORIDE 0.9% FLUSH
10.0000 mL | Freq: Once | INTRAVENOUS | Status: AC
  Administered 2018-02-22: 10 mL
  Filled 2018-02-22: qty 10

## 2018-02-22 MED ORDER — HEPARIN SOD (PORK) LOCK FLUSH 100 UNIT/ML IV SOLN
500.0000 [IU] | Freq: Once | INTRAVENOUS | Status: AC
  Administered 2018-02-22: 500 [IU] via INTRAVENOUS

## 2018-02-22 MED ORDER — HEPARIN SOD (PORK) LOCK FLUSH 100 UNIT/ML IV SOLN
500.0000 [IU] | Freq: Once | INTRAVENOUS | Status: DC
Start: 2018-02-22 — End: 2018-02-22
  Filled 2018-02-22: qty 5

## 2018-02-22 MED ORDER — IOPAMIDOL (ISOVUE-300) INJECTION 61%
INTRAVENOUS | Status: AC
  Administered 2018-02-22: 100 mL via INTRAVENOUS
  Filled 2018-02-22: qty 100

## 2018-02-23 ENCOUNTER — Inpatient Hospital Stay (HOSPITAL_BASED_OUTPATIENT_CLINIC_OR_DEPARTMENT_OTHER): Payer: Medicare Other | Admitting: Hematology and Oncology

## 2018-02-23 ENCOUNTER — Other Ambulatory Visit: Payer: Self-pay | Admitting: Hematology and Oncology

## 2018-02-23 ENCOUNTER — Encounter: Payer: Self-pay | Admitting: Hematology and Oncology

## 2018-02-23 ENCOUNTER — Telehealth: Payer: Self-pay | Admitting: Hematology and Oncology

## 2018-02-23 DIAGNOSIS — C562 Malignant neoplasm of left ovary: Secondary | ICD-10-CM | POA: Diagnosis not present

## 2018-02-23 DIAGNOSIS — G620 Drug-induced polyneuropathy: Secondary | ICD-10-CM

## 2018-02-23 DIAGNOSIS — I482 Chronic atrial fibrillation, unspecified: Secondary | ICD-10-CM

## 2018-02-23 DIAGNOSIS — D638 Anemia in other chronic diseases classified elsewhere: Secondary | ICD-10-CM | POA: Diagnosis not present

## 2018-02-23 DIAGNOSIS — T451X5A Adverse effect of antineoplastic and immunosuppressive drugs, initial encounter: Secondary | ICD-10-CM

## 2018-02-23 LAB — CA 125: Cancer Antigen (CA) 125: 11.7 U/mL (ref 0.0–38.1)

## 2018-02-23 NOTE — Assessment & Plan Note (Signed)
She has chronic atrial fibrillation, rate control She is on chronic anticoagulation therapy

## 2018-02-23 NOTE — Telephone Encounter (Signed)
Gave patient calendar of upcoming April and may appointments.

## 2018-02-23 NOTE — Assessment & Plan Note (Signed)
This is likely anemia of chronic disease. The patient denies recent history of bleeding such as epistaxis, hematuria or hematochezia. She is asymptomatic from the anemia. We will observe for now.  

## 2018-02-23 NOTE — Assessment & Plan Note (Signed)
She tolerated niraparib well Recent tumor marker and CT scan showed no evidence of disease progression She will continue niraparib indefinitely I will schedule port flush in 6 weeks and see her back in 3 months.

## 2018-02-23 NOTE — Progress Notes (Signed)
[place] OFFICE PROGRESS NOTE  Patient Care Team: Helane Rima, MD as PCP - General (Family Medicine) Encarnacion Slates, MD as Referring Physician (Neurology)  ASSESSMENT & PLAN:  Ovarian cancer [facility]) She tolerated niraparib well Recent tumor marker and CT scan showed no evidence of disease progression She will continue niraparib indefinitely I will schedule port flush in 6 weeks and see her back in 3 months.  Chronic atrial fibrillation (HCC) She has chronic atrial fibrillation, rate control She is on chronic anticoagulation therapy  Anemia, chronic disease This is likely anemia of chronic disease. The patient denies recent history of bleeding such as epistaxis, hematuria or hematochezia. She is asymptomatic from the anemia. We will observe for now.   Chemotherapy-induced peripheral neuropathy (HCC) Neuropathy is stable She is also on gabapentin for pain   No orders of the defined types were placed in this encounter.   INTERVAL HISTORY: Please see below for problem oriented charting. She returns with her daughter for further follow-up. She denies recent infection No abdominal pain, nausea or changes in bowel habits The patient denies any recent signs or symptoms of bleeding such as spontaneous epistaxis, hematuria or hematochezia.  SUMMARY OF ONCOLOGIC HISTORY: Oncology History   Negative genetic testing     Ovarian cancer ([place])   11/28/2015 Imaging    Ct scan of abdomen: Peritoneal carcinomatosis and pelvic/inguinal adenopathy. Gynecologic primary is favored.      12/17/2015 Pathology Results    Lymph node, needle/core biopsy, L inguinal LAN - METASTATIC ADENOCARCINOMA. Microscopic Comment Immunohistochemistry will be performed and reported as an addendum. (JDP:ecj 12/18/2015) ADDENDUM: Immunohistochemistry shows strong positivity with cytokeratin 7, estrogen receptor (ER), progesterone receptor (PR), p53 and WT1. Negative markers are cytokeratin 20 ,  CDX- 2 and gross cystic disease fluid protein. 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 (JDP:kh 12-19-15      12/17/2015 Procedure    Technically successful ultrasound guided core left inguinal adenopathy biopsy      12/24/2015 Tumor Marker    Patient's tumor was tested for the following markers: CA-125 Results of the tumor marker test revealed 608.2      12/27/2015 - 02/28/2016 Chemotherapy    She received neoadjuvant chemo x 3 cycles      01/01/2016 Tumor Marker    Patient's tumor was tested for the following markers: CA-125 Results of the tumor marker test revealed 741.6      01/29/2016 Procedure    Successful placement of a right internal jugular approach power injectable Port-A-Cath. The catheter is ready for immediate use      01/29/2016 Imaging    US abdomen 1. Hyperechoic 2.5 x 1.0 x 1.6 cm lesion in the region the caudate lobe of the liver. Similar finding noted on prior CT of 11/28/2015. This could represent hemangioma. This could represent a malignancy including metastatic disease. 2. Exam otherwise unremarkable. No gallstones. No biliary distention.      02/03/2016 Tumor Marker    Patient's tumor was tested for the following markers: CA-125 Results of the tumor marker test revealed 137.1      02/20/2016 Imaging    MRI brain No acute infarct.  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  No intracranial mass.  Moderate small vessel disease changes.  Global atrophy without hydrocephalus.  Expanded partially empty sella without secondary findings of pseudotumor cerebri.  Minimal mucosal thickening ethmoid sinus air cells. Small air-fluid levels maxillary sinuses  bilaterally may indicate changes of acute sinusitis.       02/28/2016 Tumor Marker    Patient's tumor was tested for the following markers: CA-125 Results of the tumor marker test  revealed 33.6      03/02/2016 Imaging    CT chest, abdomen and pelvis 1. Today's study demonstrates a positive response to therapy with resolution of the previously noted malignant ascites, significant regression of previously noted peritoneal implants, and regression of previously noted lymphadenopathy in the abdomen and pelvis. 2. No definite evidence to suggest metastatic disease to the thorax. 3. Stable 1.0 x 1.7 cm intermediate attenuation lesion associated with the posterior aspect of segment 1 of the liver, favored to represent a mildly proteinaceous hepatic cyst. The possibility of a peritoneal implant in this region is not entirely excluded, but is not favored on today's examination. 4. Extensive post infectious scarring inguinal upper right lung, likely sequela of prior necrotizing pneumonia. 5. Multiple tiny pulmonary nodules scattered throughout the lungs bilaterally all measuring 4 mm or less. These are nonspecific, but favored to be benign. Given the patient's history of primary gynecologic malignancy, attention on followup studies is recommended to ensure the stability of these findings. 6. Additional incidental findings, as above      03/14/2016 Imaging    CT angiogram 1. No pulmonary emboli. 2. Chronic changes to the right upper lobe. 3. Stable lesion in the caudate lobe of the liver. 4. Stable soft tissue prominence to the right of the trachea as described above. 5. Stable small nodule in the right lung.       03/24/2016 Pathology Results    1. Omentum, resection for tumor - FOCAL ADENOCARCINOMA ASSOCIATED WITH EXTENSIVE FIBROSIS, INFLAMMATION AND HEMOSIDERIN DEPOSITION. 2. Uterus +/- tubes/ovaries, neoplastic, cervix - CERVIX: SLIGHT CERVICITIS AND SQUAMOUS METAPLASIA. - ENDOMETRIUM: ATROPHIC, NO HYPERPLASIA OR MALIGNANCY. - MYOMETRIUM: LEIOMYOMATA WITH DEGENERATIVE CHANGES. NO MALIGNANCY. - UTERINE SEROSA: FOCAL ADENOCARCINOMA ASSOCIATED WITH ADHESIONS. - RIGHT OVARY:  BENIGN CALCIFIED NODULE AND BENIGN SEROUS CYST. NO MALIGNANCY IDENTIFIED. - RIGHT FALLOPIAN TUBE: UNREMARKABLE. NO MALIGNANCY IDENTIFIED. - LEFT OVARY: FOCAL ADENOCARCINOMA ASSOCIATED WITH EXTENSIVE FIBROSIS. - LEFT FALLOPIAN TUBE: HYDROSALPINX. NO MALIGNANCY IDENTIFIED. 3. Lymph node, biopsy, right external iliac - METASTATIC ADENOCARCINOMA, 4. Soft tissue, biopsy, left para colic gutter - FOCAL ADENOCARCINOMA ASSOCIATED WITH FIBROSIS. Microscopic Comment 2. OVARY Specimen(s): Uterus with bilateral ovaries, omentum, right external iliac lymph node and left paracolic gutter. Procedure: (including lymph node sampling) Hysterectomy with bilateral salpingo-oophorectomy, omentectomy, lymph node biopsy and paracolic gutter biopsy. Primary tumor site (including laterality): Left ovary. Ovarian surface involvement: Yes Ovarian capsule intact without fragmentation: N/A Maximum tumor size (cm): 2 cm, 2 cm Histologic type: Serous carcinoma. Grade: 3 Peritoneal implants: (specify invasive or non-invasive): Left paracolic gutter positive for carcinoma Pelvic extension (list additional structures on separate lines and if involved): Omentum, right paracolic gutter and uterine serosa. Lymph nodes: number examined 1 ; number positive 1 TNM code: ypT3a, ypN1b, ypMX FIGO Stage (based on pathologic findings, needs clinical correlation): IIIA2 Comments: The left ovary has a 2 cm nodule, which is composed primarily of fibrous tissue with small foci of residual adenocarcinoma. There is also microscopic involvement of the uterine serosa, the left paracolic gutter biopsy and the omentum. The left paracolic gutter and omental involvement is microscopic and both are associated with extensive fibrosis with focal hemosiderin deposition. The right external iliac node is completely replaced by metastatic adenocarcinoma with serous features. (JDP:ecj 03/30/2016)      03/24/2016 Surgery  Operation: Robotic-assisted  laparoscopic total hysterectomy with bilateral salpingoophorectomy, omentectomy, radical tumor debulking  Surgeon: Donaciano Eva  Operative Findings:  : small nodules in omentum, no ascites. Omental nodule (2cm) adherent to lower anterior abdominal wall. 2cm left colonic gutter cystic nodule. 6cm right external iliac lymph node. Grossly normal ovaries and tubes. Fibroid uterus. No gross residual disease at completion of surgery representing R0 optimal/complete resection.       04/17/2016 Imaging    CT abdomen and pelvis 1. There is no evidence of acute inflammatory process within abdomen or pelvis. 2. Stable low-density lesion within caudate lobe of the liver. No new hepatic lesions are noted. 3. Again noted lobulated renal contour and multifocal renal cortical scarring. No hydronephrosis or hydroureter. 4. No significant mesenteric adenopathy. No retroperitoneal adenopathy. Stable minimal residual peritoneal thickening within pelvis. No new pelvic implants or pelvic ascites. 5. Status post hysterectomy.  Unremarkable urinary bladder. 6. Moderate stool within colon as described above. No evidence of colitis.       04/24/2016 - 06/19/2016 Chemotherapy    She received 3 more cycles of chemo after surgery      05/14/2016 Tumor Marker    Patient's tumor was tested for the following markers: CA-125 Results of the tumor marker test revealed 22.5      06/04/2016 Tumor Marker    Patient's tumor was tested for the following markers: CA-125 Results of the tumor marker test revealed 13.7      07/07/2016 Genetic Testing    Patient has genetic testing done for breast/ovasrian cancer panel Results revealed patient has no actionable mutation      07/23/2016 Imaging    Ct abdomen and pelvis 1. Heterogeneously enhancing 4.9 x 3.2 x 4.4 cm mass along the right pelvic sidewall may represent locally recurrent disease or malignant lymphadenopathy. 2. No other signs of definite metastatic  disease noted elsewhere in the abdomen or pelvis. 3. Stable low-attenuation hepatic lesion in the caudate lobe of the liver, which appears to demonstrates some progressive centripetal filling on delayed images. This lesion is presumably benign given its stability compared to prior studies, and is favored to represent a small cavernous hemangioma. 4. Cardiomegaly with biatrial dilatation, which is very severe on the right side. 5. Aortic atherosclerosis. 6. Additional incidental findings, as above.      07/23/2016 Tumor Marker    Patient's tumor was tested for the following markers: CA-125 Results of the tumor marker test revealed 12.3      09/30/2016 Imaging    Ct abdomen and pelvis: 1. 4.9 cm heterogeneously enhancing mass seen along the right pelvic sidewall previously has almost completely resolved. There is some ill-defined soft tissue attenuation/peritoneal thickening in this region today but no discrete measurable lesion is evident. 2. No new or progressive findings on today's exam. 3. Stable hypo attenuating lesion in the caudate lobe of the liver. 4. Subtle aortic atherosclerosis      09/30/2016 Tumor Marker    Patient's tumor was tested for the following markers: CA-125 Results of the tumor marker test revealed 9.6      10/05/2016 Pathology Results    Vagina, biopsy, left cuff - BENIGN FIBROEPITHELIAL (STROMAL) POLYP. - NO DYSPLASIA, ATYPIA OR MALIGNANCY IDENTIFIED.      01/14/2017 Tumor Marker    Patient's tumor was tested for the following markers: CA-125 Results of the tumor marker test revealed 11.9      05/05/2017 Tumor Marker    Patient's tumor was tested for the following markers: CA-125  Results of the tumor marker test revealed 28      06/14/2017 Tumor Marker    Patient's tumor was tested for the following markers: CA-125 Results of the tumor marker test revealed 45.7      06/24/2017 Imaging    Ct abdomen and pelvis: Increased peritoneal metastatic disease in  abdomen and pelvis since prior exam. No evidence of ascites. Stable small benign hepatic hemangioma.      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 Does of chemo is reduced starting cycle 2 onwards      09/02/2017 Tumor Marker    Patient's tumor was tested for the following markers: CA-125 Results of the tumor marker test revealed 27.7      09/16/2017 Imaging    CT CHEST IMPRESSION  1. No acute process or evidence of metastatic disease in the chest. 2. Right upper lung bronchiectasis, consolidation, and architectural distortion are most likely post infectious or inflammatory and not significantly changed. 3. Aortic Atherosclerosis (ICD10-I70.0).  CT ABDOMEN AND PELVIS IMPRESSION  1. Response to therapy of nodal and peritoneal metastasis. 2. No new or progressive disease. 3. Caudate lobe hemangioma, as before. 4. Bilateral renal cortical thinning. Similar minimal right-sided caliectasis and hydroureter. Likely secondary to low-grade obstruction by the dominant right pelvic implant.      09/22/2017 Tumor Marker    Patient's tumor was tested for the following markers: CA-125 Results of the tumor marker test revealed 18      11/03/2017 Tumor Marker    Patient's tumor was tested for the following markers: CA-125 Results of the tumor marker test revealed 13.4      11/25/2017 Imaging    1. Stable to slight interval decrease in size of nodal and peritoneal metastatic disease within the abdomen. 2. No new or progressive disease.      12/03/2017 -  Chemotherapy    The patient is prescribed Zejula      01/17/2018 Tumor Marker    Patient's tumor was tested for the following markers: CA-125 Results of the tumor marker test  revealed 11.9      02/22/2018 Tumor Marker    Patient's tumor was tested for the following markers: CA-125 Results of the tumor marker test revealed 11.7      02/22/2018 Imaging    1. Improved nodular thickening along the vaginal cuff on the left and medial to the right iliac vessels, likely treated implants. No evidence of progressive peritoneal disease. 2. Stable hepatic hemangioma. No evidence of solid visceral organ metastases. 3. Bilateral renal cortical scarring.       REVIEW OF SYSTEMS:   Constitutional: Denies fevers, chills or abnormal weight loss Eyes: Denies blurriness of vision Ears, nose, mouth, throat, and face: Denies mucositis or sore throat Respiratory: Denies cough, dyspnea or wheezes Cardiovascular: Denies palpitation, chest discomfort or lower extremity swelling Gastrointestinal:  Denies nausea, heartburn or change in bowel habits Skin: Denies abnormal skin rashes Lymphatics: Denies new lymphadenopathy or easy bruising Neurological:Denies numbness, tingling or new weaknesses Behavioral/Psych: Mood is stable, no new changes  All other systems were reviewed with the patient and are negative.  I have reviewed the past medical history, past surgical history, social history and family history  with the patient and they are unchanged from previous note.  ALLERGIES:  is allergic to codeine and latex.  MEDICATIONS:  Current Outpatient Medications  Medication Sig Dispense Refill  . apixaban (ELIQUIS) 5 MG TABS tablet Take 1 tablet (5 mg total) by mouth 2 (two) times daily. 60 tablet 5  . aspirin EC 81 MG tablet Take 81 mg by mouth daily.    Marland Kitchen atorvastatin (LIPITOR) 40 MG tablet Take 40 mg by mouth at bedtime.    . gabapentin (NEURONTIN) 300 MG capsule Take 1 capsule (300 mg total) 3 (three) times daily by mouth. 90 capsule 3  . gabapentin (NEURONTIN) 300 MG capsule TAKE ONE CAPSULE BY MOUTH TWICE DAILY 60 capsule 0  . lactulose (CHRONULAC) 10 GM/15ML solution Take 15  mLs (10 g total) by mouth 3 (three) times daily as needed for mild constipation. 240 mL 0  . lidocaine-prilocaine (EMLA) cream Apply to Porta-Cath 1-2 hours prior to access as directed. 30 g 1  . loratadine (CLARITIN) 10 MG tablet Take 10 mg by mouth daily as needed (Recommended for chemo pain). Reported on 03/16/2016    . mirtazapine (REMERON) 15 MG tablet Take 15 mg by mouth at bedtime.    Alanda Slim Tosylate (ZEJULA) 100 MG CAPS Take 300 mg by mouth daily. 90 capsule 11  . omega-3 acid ethyl esters (LOVAZA) 1 g capsule Take 1 g by mouth daily.    Marland Kitchen oxyCODONE (OXY IR/ROXICODONE) 5 MG immediate release tablet Take 1 tablet (5 mg total) by mouth every 4 (four) hours as needed for severe pain. 90 tablet 0  . PARoxetine (PAXIL) 20 MG tablet TAKE 1 TABLET BY MOUTH DAILY (Patient taking differently: TAKE 20 MG BY MOUTH DAILY) 90 tablet 0  . verapamil (CALAN-SR) 240 MG CR tablet Take 1 tablet (240 mg total) by mouth daily. 30 tablet 0   No current facility-administered medications for this visit.     PHYSICAL EXAMINATION: ECOG PERFORMANCE STATUS: 2 - Symptomatic, <50% confined to bed  Vitals:   02/23/18 1025  BP: 126/89  Pulse: (!) 115  Resp: 18  Temp: 97.6 F (36.4 C)  SpO2: 100%   Filed Weights   02/23/18 1025  Weight: 224 lb 6.4 oz (101.8 kg)    GENERAL:alert, no distress and comfortable SKIN: skin color, texture, turgor are normal, no rashes or significant lesions EYES: normal, Conjunctiva are pink and non-injected, sclera clear OROPHARYNX:no exudate, no erythema and lips, buccal mucosa, and tongue normal  NECK: supple, thyroid normal size, non-tender, without nodularity LYMPH:  no palpable lymphadenopathy in the cervical, axillary or inguinal LUNGS: clear to auscultation and percussion with normal breathing effort HEART: Irregular rate and rhythm with mild trace lower extremity edema ABDOMEN:abdomen soft, non-tender and normal bowel sounds Musculoskeletal:no cyanosis of digits  and no clubbing  NEURO: alert & oriented x 3 with dysarthria and chronic hemiparesis from stroke  LABORATORY DATA:  I have reviewed the data as listed    Component Value Date/Time   NA 140 02/22/2018 1012   NA 142 12/08/2017 1051   K 3.9 02/22/2018 1012   K 4.4 12/08/2017 1051   CL 106 02/22/2018 1012   CL 105 06/14/2017 1430   CO2 27 02/22/2018 1012   CO2 28 12/08/2017 1051   GLUCOSE 95 02/22/2018 1012   GLUCOSE 97 12/08/2017 1051   BUN 10 02/22/2018 1012   BUN 16.2 12/08/2017 1051   CREATININE 0.86 02/22/2018 1012   CREATININE 0.9 12/08/2017 1051   CALCIUM 9.6  02/22/2018 1012   CALCIUM 9.5 12/08/2017 1051   PROT 7.2 02/22/2018 1012   PROT 6.9 12/08/2017 1051   ALBUMIN 3.6 02/22/2018 1012   ALBUMIN 3.6 12/08/2017 1051   AST 18 02/22/2018 1012   AST 18 12/08/2017 1051   ALT 15 02/22/2018 1012   ALT 20 12/08/2017 1051   ALKPHOS 135 02/22/2018 1012   ALKPHOS 131 12/08/2017 1051   BILITOT 0.6 02/22/2018 1012   BILITOT 0.70 12/08/2017 1051   GFRNONAA >60 02/22/2018 1012   GFRAA >60 02/22/2018 1012    No results found for: SPEP, UPEP  Lab Results  Component Value Date   WBC 4.8 02/22/2018   NEUTROABS 2.4 02/22/2018   HGB 11.3 (L) 02/22/2018   HCT 32.9 (L) 02/22/2018   MCV 113.8 (H) 02/22/2018   PLT 212 02/22/2018      Chemistry      Component Value Date/Time   NA 140 02/22/2018 1012   NA 142 12/08/2017 1051   K 3.9 02/22/2018 1012   K 4.4 12/08/2017 1051   CL 106 02/22/2018 1012   CL 105 06/14/2017 1430   CO2 27 02/22/2018 1012   CO2 28 12/08/2017 1051   BUN 10 02/22/2018 1012   BUN 16.2 12/08/2017 1051   CREATININE 0.86 02/22/2018 1012   CREATININE 0.9 12/08/2017 1051      Component Value Date/Time   CALCIUM 9.6 02/22/2018 1012   CALCIUM 9.5 12/08/2017 1051   ALKPHOS 135 02/22/2018 1012   ALKPHOS 131 12/08/2017 1051   AST 18 02/22/2018 1012   AST 18 12/08/2017 1051   ALT 15 02/22/2018 1012   ALT 20 12/08/2017 1051   BILITOT 0.6 02/22/2018 1012    BILITOT 0.70 12/08/2017 1051       RADIOGRAPHIC STUDIES: I have personally reviewed the radiological images as listed and agreed with the findings in the report. Ct Abdomen Pelvis W Contrast  Result Date: 02/22/2018 CLINICAL DATA:  Left breast cancer diagnosed in 2008. Left ovarian cancer diagnosed in 2018. Restaging post hysterectomy and chemotherapy. EXAM: CT ABDOMEN AND PELVIS WITH CONTRAST TECHNIQUE: Multidetector CT imaging of the abdomen and pelvis was performed using the standard protocol following bolus administration of intravenous contrast. CONTRAST:  100 cc Omnipaque 300 COMPARISON:  CT 09/16/2017 and 11/24/2017 FINDINGS: Lower chest: There is stable linear scarring or atelectasis at both lung bases. No significant pleural or pericardial effusion. Right cardiac chamber enlargement noted, stable. There is mild aortic atherosclerosis. Hepatobiliary: Stable low-density lesion in the caudate lobe measuring 19 x 14 mm on image 13/2, previously characterized as a hemangioma. No new or enlarging hepatic lesions. No evidence of gallstones, gallbladder wall thickening or biliary dilatation. Pancreas: Unremarkable. No pancreatic ductal dilatation or surrounding inflammatory changes. Spleen: Normal in size without focal abnormality. Adrenals/Urinary Tract: Both adrenal glands appear normal. Stable prominent renal cortical scarring bilaterally. No evidence of enhancing renal mass, urinary tract calculus or hydronephrosis. The bladder appears unremarkable for its degree of distention. Stomach/Bowel: No evidence of bowel wall thickening, distention or surrounding inflammatory change. Moderate stool throughout the colon. Vascular/Lymphatic: There are no enlarged abdominal or pelvic lymph nodes. Stable 7 mm right ileocolonic mesenteric lymph node on image 53/2. Minimal aortoiliac atherosclerosis. Reproductive: Hysterectomy. Stable soft tissue thickening along the vaginal cuff on the left, measuring 16 x 24 mm  on image 70/2. Mild residual soft tissue thickening along the right iliac vessels without residual measurable adnexal mass. Other: No ascites or increased peritoneal nodularity. Musculoskeletal: No acute or significant osseous findings.  Prominent facet disease in the lower lumbar spine with a grade 1 anterolisthesis and mild biforaminal narrowing at L4-5. Asymmetric left hip arthropathy. IMPRESSION: 1. Improved nodular thickening along the vaginal cuff on the left and medial to the right iliac vessels, likely treated implants. No evidence of progressive peritoneal disease. 2. Stable hepatic hemangioma. No evidence of solid visceral organ metastases. 3. Bilateral renal cortical scarring. Electronically Signed   By: Richardean Sale M.D.   On: 02/22/2018 17:40    All questions were answered. The patient knows to call the clinic with any problems, questions or concerns. No barriers to learning was detected.  I spent 15 minutes counseling the patient face to face. The total time spent in the appointment was 20 minutes and more than 50% was on counseling and review of test results  Heath Lark, MD 02/23/2018 10:42 AM

## 2018-02-23 NOTE — Assessment & Plan Note (Signed)
Neuropathy is stable She is also on gabapentin for pain

## 2018-02-25 ENCOUNTER — Encounter: Payer: Self-pay | Admitting: Rehabilitation

## 2018-02-25 ENCOUNTER — Ambulatory Visit: Payer: Medicare Other | Admitting: Rehabilitation

## 2018-02-25 ENCOUNTER — Other Ambulatory Visit: Payer: Self-pay | Admitting: Nurse Practitioner

## 2018-02-25 DIAGNOSIS — M6281 Muscle weakness (generalized): Secondary | ICD-10-CM | POA: Diagnosis not present

## 2018-02-25 DIAGNOSIS — R2681 Unsteadiness on feet: Secondary | ICD-10-CM

## 2018-02-25 DIAGNOSIS — Z1231 Encounter for screening mammogram for malignant neoplasm of breast: Secondary | ICD-10-CM

## 2018-02-25 DIAGNOSIS — R2689 Other abnormalities of gait and mobility: Secondary | ICD-10-CM

## 2018-02-25 DIAGNOSIS — I69353 Hemiplegia and hemiparesis following cerebral infarction affecting right non-dominant side: Secondary | ICD-10-CM

## 2018-02-25 NOTE — Therapy (Signed)
[facility] [address] [place], [place], [address] Phone: [phone]   Fax:  [phone]  Physical Therapy Treatment  Patient Details  Name: Brenda Cunningham MRN: [id number] Date of Birth: 1951/08/14 Referring Provider: Lendon Collar MD   Encounter Date: 02/25/2018  PT End of Session - 02/25/18 1237    Visit Number  8    Number of Visits  17 eval +16 visits    Date for PT Re-Evaluation  03/22/18    Authorization Type  medicare    PT Start Time  0848    PT Stop Time  0930    PT Time Calculation (min)  42 min    Equipment Utilized During Treatment  Gait belt    Activity Tolerance  Patient tolerated treatment well    Behavior During Therapy  [facility] for tasks assessed/performed       Past Medical History:  Diagnosis Date  . A-fib ([place])   . Anxiety   . Arthritis   . Breast cancer ([place]) 2008   s/p lumpectomy, chemo and radiation therapy  . Complication of anesthesia    DIFFICULTY WAKING UP FROM ANESTHESIA  . Hyperlipidemia   . Hypertension   . Stroke ([place]) 07/29/2015  . Wears glasses     Past Surgical History:  Procedure Laterality Date  . APPENDECTOMY    . BREAST LUMPECTOMY  2008   left  . COLONOSCOPY  2004  . DEBULKING N/A 03/24/2016   Procedure: RADICAL TUMOR DEBULKING;  Surgeon: Everitt Amber, MD;  Location: [facility];  Service: Gynecology;  Laterality: N/A;  . OMENTECTOMY N/A 03/24/2016   Procedure: OMENTECTOMY;  Surgeon: Everitt Amber, MD;  Location: [facility];  Service: Gynecology;  Laterality: N/A;  . ROBOTIC ASSISTED TOTAL HYSTERECTOMY WITH BILATERAL SALPINGO OOPHERECTOMY Bilateral 03/24/2016   Procedure: XI ROBOTIC ASSISTED TOTA LAPAROSCOPIC  HYSTERECTOMY WITH BILATERAL SALPINGO OOPHORECTOMY;  Surgeon: Everitt Amber, MD;  Location: [facility];  Service: Gynecology;  Laterality: Bilateral;  . TUBAL LIGATION      There were no vitals filed for this visit.  Subjective Assessment - 02/25/18 0853    Subjective  Pt got  good report from cancer doctor, went to [employer] to celebrate.     Patient is accompained by:  Family member    Patient Stated Goals  Has not been able to get in/out of truck since beginning chemotherapy (months ago).     Currently in Pain?  No/denies                No data recorded       [facility] Adult PT Treatment/Exercise - 02/25/18 0914      Ambulation/Gait   Ambulation/Gait  Yes    Ambulation/Gait Assistance  4: Min guard;4: Min assist    Ambulation/Gait Assistance Details  Pt ambulatory into clinic with RW at mod I level.  Once seated pt wanting to show PT that she could ambulate around track without RW.  She did so at min/guard level, however recommend she not do this at home without close S from family, esp until she gets her brace due to potential toe catch.  PT donned blue rocker AFO on RLE and then had pt ambulate x 230' at min A level (due to needing to get used to the brace) with cues for increased stride length and upright posture.  Ended session with another 45' with AFO and without AD at min/guard level with cues for improved hip protraction, posture and increased arm swing.  Ambulation Distance (Feet)  115 Feet 230' x 1 and another 115'x 1    Assistive device  Rolling walker;None    Gait Pattern  Step-through pattern;Poor foot clearance - right    Ambulation Surface  Level;Indoor    Stairs  Yes    Stairs Assistance  4: Min guard;4: Min assist    Stairs Assistance Details (indicate cue type and reason)  Had pt negotiate stairs using LUE in alternating fashion initially requiring min A at top to prevent LOB.  She was also able to descend in reciprocal pattern, however felt that more assist was needed therefore recommend she to step to (which we did during session) unless she has someone there to assist.     Stair Management Technique  One rail Left;Alternating pattern;Step to pattern;Forwards    Number of Stairs  4 x 2 reps    Height of Stairs  6       Self-Care   Self-Care  Other Self-Care Comments    Other Self-Care Comments   PT discussed that MD had not responded via EPIC regarding AFO, therefore would fax clinic directly for AFO order.        Neuro Re-ed    Neuro Re-ed Details   sit<>mini stand with use of large physioball to roll forward, holding into mini stand x 5 secs x 8 reps progressing to sit<>stand without ball x 8 reps with cues and facilitation for forward trunk lean prior to powering up through LEs. In standing had pt tap to bubble disc alternating LEs, however pt unable to fully lift LLE due to fear that RLE would buckle, thereore had her just lift LLE with PT providing facilitation and tapping to RLE for improved quad activation and cues for improved glute activation.  Pt did improve within session, but still fearful without UE support.               PT Education - 02/25/18 0854    Education provided  Yes    Education Details  see self care    Person(s) Educated  Patient    Methods  Explanation    Comprehension  Verbalized understanding       PT Short Term Goals - 02/18/18 0907      PT SHORT TERM GOAL #1   Title  The patient will perform HEP with assist from caregiver.      Time  4    Period  Weeks    Status  Achieved      PT SHORT TERM GOAL #2   Title  The patient will improve Berg from 11/56 to > or equal to 16/56 to demonstrate improved steady state standing for ADLs.    Baseline  27/56 on 02/18/18    Time  4    Period  Weeks    Status  Achieved      PT SHORT TERM GOAL #3   Title  The patient will move sit<>stand from mat height surface with SBA.    Baseline  mod A from mat sit<>stand to RW at eval.  On 02/11/2018:  close supervision for sit<>stand x 10 reps.    Time  4    Period  Weeks    Status  Achieved      PT SHORT TERM GOAL #4   Title  The patient will improve gait speed from 1.95 ft/sec to > or equal to 2.3 ft/sec to demonstrate improving functional mobility.    Baseline  2.58 ft/sec  Time  4    Period  Weeks    Status  Achieved      PT SHORT TERM GOAL #5   Title  The patient will ambulate x 400 feet nonstop with least restrictive assistive device mod indep for improved household mobility.    Baseline  345 ft nonstop with supervision today using RW.    Time  4    Period  Weeks    Status  Partially Met        PT Long Term Goals - 01/21/18 2048      PT LONG TERM GOAL #1   Title  The patient will report eating meals at kitchen table 2 out of 3 meals/day to demo increasing walking in home for long term mobility.    Time  8    Period  Weeks    Target Date  03/22/18      PT LONG TERM GOAL #2   Title  The patient will improve Berg from 11/56 to > or equal to 20/56 to demonstrate improving static balance for ADLs.    Time  8    Period  Weeks    Target Date  03/22/18      PT LONG TERM GOAL #3   Title  The patient will move sit<>stand from chair with arm rests and mat height surfaces mod indep to assistive device 5/5 times for improved mobility.    Time  8    Period  Weeks    Target Date  03/22/18      PT LONG TERM GOAL #4   Title  The patient will negotiate 4 steps mod indep with step to pattern descending for household entry/exit.    Time  8    Period  Weeks    Target Date  03/22/18      PT LONG TERM GOAL #5   Title  Pt will ambulate 560f with LRAD and supervision outdoors over level and unlevel paved surfaces, ramps, and curbs to incr functional mobility and safety while ambulating in the community.    Time  8    Period  Weeks    Target Date  03/22/18            Plan - 02/25/18 1237    Clinical Impression Statement  Skilled session continues to address forward weight shift with sit<>stand, RLE WB/activation with standing tasks and gait with and without RW with R AFO for improved foot clearance.     PT Treatment/Interventions  ADLs/Self Care Home Management;Gait training;Stair training;Functional mobility training;Therapeutic activities;Therapeutic  exercise;Patient/family education;Balance training;Neuromuscular re-education;Orthotic Fit/Training    PT Next Visit Plan  Continue to LTGs, check on order for blue rocker, *GIVE exercisses written from 02/14/2018.  Gait with blue rocker, slower pace for greater distance.    Consulted and Agree with Plan of Care  Patient       Patient will benefit from skilled therapeutic intervention in order to improve the following deficits and impairments:  Abnormal gait, Decreased mobility, Impaired tone, Decreased activity tolerance, Decreased endurance, Decreased strength, Decreased range of motion, Decreased balance, Difficulty walking, Impaired flexibility  Visit Diagnosis: Muscle weakness (generalized)  Other abnormalities of gait and mobility  Unsteadiness on feet  Hemiplegia and hemiparesis following cerebral infarction affecting right non-dominant side ([facility]     Problem List Patient Active Problem List   Diagnosis Date Noted  . Other constipation 01/17/2018  . Anemia, chronic disease 11/25/2017  . Goals of care, counseling/discussion 07/15/2017  . Genetic testing  08/17/2016  . Leukopenia due to antineoplastic chemotherapy ([place]) 06/05/2016  . Costochondritis, acute 05/02/2016  . Ovarian cancer ([place]) 03/24/2016  . Anemia due to antineoplastic chemotherapy 03/17/2016  . Port catheter in place 03/17/2016  . Portacath in place 02/05/2016  . Chemotherapy-induced peripheral neuropathy ([place]) 02/05/2016  . Hematoma 01/27/2016  . Chronic anticoagulation 01/22/2016  . Chronic atrial fibrillation ([place]) 01/22/2016  . PICC (peripherally inserted central catheter) in place 01/22/2016  . Chemotherapy induced neutropenia ([place]) 01/22/2016  . Poor venous access 01/22/2016  . CVA (cerebral vascular accident) ([place]) 12/27/2015  . Right hemiparesis ([place]) 12/27/2015  . Expressive dysphasia 12/27/2015  . History of left breast cancer 12/27/2015  . H/O tubal ligation 12/27/2015  . Colon polyps  12/27/2015  . International Federation of Gynecology and Obstetrics (FIGO) stage IVB epithelial ovarian cancer ([place]) 12/27/2015  . Ovarian cancer, bilateral ([place]) 12/24/2015  . Inguinal adenopathy   . Carcinomatosis ([place]) 12/06/2015  . Essential hypertension, benign 07/18/2014  . A-fib ([place]) 07/18/2014  . Anxiety state, unspecified 07/18/2014    Cameron Sprang, PT, MPT [facility] [address] [place], [place], [address] Phone: [phone]   Fax:  [phone] 02/25/18, 12:40 PM  Name: Brenda Cunningham MRN: [id number] Date of Birth: 12-27-1950

## 2018-03-02 ENCOUNTER — Encounter: Payer: Self-pay | Admitting: Rehabilitative and Restorative Service Providers"

## 2018-03-02 ENCOUNTER — Ambulatory Visit: Payer: Medicare Other | Admitting: Rehabilitative and Restorative Service Providers"

## 2018-03-02 ENCOUNTER — Ambulatory Visit: Payer: Medicare Other | Admitting: Occupational Therapy

## 2018-03-02 VITALS — BP 142/94 | HR 111

## 2018-03-02 DIAGNOSIS — M6281 Muscle weakness (generalized): Secondary | ICD-10-CM | POA: Diagnosis not present

## 2018-03-02 DIAGNOSIS — R2689 Other abnormalities of gait and mobility: Secondary | ICD-10-CM

## 2018-03-02 DIAGNOSIS — I69353 Hemiplegia and hemiparesis following cerebral infarction affecting right non-dominant side: Secondary | ICD-10-CM

## 2018-03-02 DIAGNOSIS — R2681 Unsteadiness on feet: Secondary | ICD-10-CM

## 2018-03-02 DIAGNOSIS — M25641 Stiffness of right hand, not elsewhere classified: Secondary | ICD-10-CM

## 2018-03-02 NOTE — Therapy (Signed)
[facility] [address] [place], [place], [address] Phone: [phone]   Fax:  [phone]  Physical Therapy Treatment  Patient Details  Name: Brenda Cunningham MRN: [id number] Date of Birth: 06-16-1951 Referring Provider: Lendon Collar MD   Encounter Date: 03/02/2018  PT End of Session - 03/02/18 1433    Visit Number  9    Number of Visits  17 eval +16 visits    Date for PT Re-Evaluation  03/22/18    Authorization Type  medicare    PT Start Time  0933    PT Stop Time  1015    PT Time Calculation (min)  42 min    Equipment Utilized During Treatment  Gait belt    Activity Tolerance  Patient tolerated treatment well    Behavior During Therapy  [facility] for tasks assessed/performed       Past Medical History:  Diagnosis Date  . A-fib ([place])   . Anxiety   . Arthritis   . Breast cancer ([place]) 2008   s/p lumpectomy, chemo and radiation therapy  . Complication of anesthesia    DIFFICULTY WAKING UP FROM ANESTHESIA  . Hyperlipidemia   . Hypertension   . Stroke ([place]) 07/29/2015  . Wears glasses     Past Surgical History:  Procedure Laterality Date  . APPENDECTOMY    . BREAST LUMPECTOMY  2008   left  . COLONOSCOPY  2004  . DEBULKING N/A 03/24/2016   Procedure: RADICAL TUMOR DEBULKING;  Surgeon: Everitt Amber, MD;  [facility];  Service: Gynecology;  Laterality: N/A;  . OMENTECTOMY N/A 03/24/2016   Procedure: OMENTECTOMY;  Surgeon: Everitt Amber, MD;  [facility];  Service: Gynecology;  Laterality: N/A;  . ROBOTIC ASSISTED TOTAL HYSTERECTOMY WITH BILATERAL SALPINGO OOPHERECTOMY Bilateral 03/24/2016   Procedure: XI ROBOTIC ASSISTED TOTA LAPAROSCOPIC  HYSTERECTOMY WITH BILATERAL SALPINGO OOPHORECTOMY;  Surgeon: Everitt Amber, MD;  [facility];  Service: Gynecology;  Laterality: Bilateral;  . TUBAL LIGATION      Vitals:   03/02/18 1020  BP: (!) 142/94  Pulse: (!) 111    Subjective Assessment - 03/02/18 0936    Subjective  The patient reports she is eating at kitchen table 2 days/week.  She cites dogs begging for food as barrier to eating meals in kitchen.     Patient Stated Goals  Has not been able to get in/out of truck since beginning chemotherapy (months ago).     Currently in Pain?  Yes mild pain in R pectoralis region (hemi side) "it's not that bad"              [facility] Adult PT Treatment/Exercise - 03/02/18 0940      Ambulation/Gait   Ambulation/Gait  Yes    Ambulation/Gait Assistance  4: Min guard;4: Min assist    Ambulation/Gait Assistance Details  Patient ambulated without device with blue rocker R AFO donned *did not note any recurvatum, even when fatigued today.  At end of gait activities today, PT monitored HR and spO2.  Her HR=155 immediately after walking and spO2=99%.  With rest, her HR drops to 108 prior to leaving.  *PT to monitor HR and get baseline before ambulation next visit.    Ambulation Distance (Feet)  175 Feet 415 ft with RW and supervision, 230 ft with RW    Assistive device  None;Rolling walker    Ambulation Surface  Level;Indoor    Stairs  Yes    Stairs Assistance  4: Min guard  Stairs Assistance Details (indicate cue type and reason)  PT recommended patient descend with step to pattern.  She ascends with CGA and reciprocal pattern and needed cues to use step to for safety coming down steps.    Stair Management Technique  One rail Left;Alternating pattern;Step to pattern;Forwards    Number of Stairs  8    Pre-Gait Activities  Patent fatigues with stair negotiation.    Gait Comments  "I can't walk in that without it bending my knee back".  PT not sure of meaning due to aphasia.  Will further assess for recurvatum in blue rocker.  During today's session, did not see evidence of recurvatum with blue rocker.                 PT Short Term Goals - 02/18/18 2878      PT SHORT TERM GOAL #1   Title  The patient will perform HEP with assist from caregiver.       Time  4    Period  Weeks    Status  Achieved      PT SHORT TERM GOAL #2   Title  The patient will improve Berg from 11/56 to > or equal to 16/56 to demonstrate improved steady state standing for ADLs.    Baseline  27/56 on 02/18/18    Time  4    Period  Weeks    Status  Achieved      PT SHORT TERM GOAL #3   Title  The patient will move sit<>stand from mat height surface with SBA.    Baseline  mod A from mat sit<>stand to RW at eval.  On 02/11/2018:  close supervision for sit<>stand x 10 reps.    Time  4    Period  Weeks    Status  Achieved      PT SHORT TERM GOAL #4   Title  The patient will improve gait speed from 1.95 ft/sec to > or equal to 2.3 ft/sec to demonstrate improving functional mobility.    Baseline  2.58 ft/sec     Time  4    Period  Weeks    Status  Achieved      PT SHORT TERM GOAL #5   Title  The patient will ambulate x 400 feet nonstop with least restrictive assistive device mod indep for improved household mobility.    Baseline  345 ft nonstop with supervision today using RW.    Time  4    Period  Weeks    Status  Partially Met        PT Long Term Goals - 03/02/18 1436      PT LONG TERM GOAL #1   Title  The patient will report eating meals at kitchen table 2 out of 3 meals/day to demo increasing walking in home for long term mobility.    Time  8    Period  Weeks    Target Date  03/22/18      PT LONG TERM GOAL #2   Title  The patient will improve Berg from 11/56 to > or equal to 20/56 to demonstrate improving static balance for ADLs.    Baseline  *see short term goals    Time  8    Period  Weeks    Status  Achieved      PT LONG TERM GOAL #3   Title  The patient will move sit<>stand from chair with arm rests and mat height surfaces mod  indep to assistive device 5/5 times for improved mobility.    Time  8    Period  Weeks      PT LONG TERM GOAL #4   Title  The patient will negotiate 4 steps mod indep with step to pattern descending for household  entry/exit.    Time  8    Period  Weeks      PT LONG TERM GOAL #5   Title  Pt will ambulate 562f with LRAD and supervision outdoors over level and unlevel paved surfaces, ramps, and curbs to incr functional mobility and safety while ambulating in the community.    Time  8    Period  Weeks            Plan - 03/02/18 1011    Clinical Impression Statement  The patient is tolerating greater distance ambulation, however HR checked today and is elevated to 155 at end of gait.  It does significantly drop with resting, however decreases to 94 within 5 minutes of resting, then increased again after walking from PT mat to table with OT.    PT to monitor in future.  Patient progressing with gait and PT to continue working [place]     PT Treatment/Interventions  ADLs/Self Care Home Management;Gait training;Stair training;Functional mobility training;Therapeutic activities;Therapeutic exercise;Patient/family education;Balance training;Neuromuscular re-education;Orthotic Fit/Training    PT Next Visit Plan  Monitor heart rate to get baseline and exertional HR. Continue working on LThe St. Paul Travelersgetting blue rStage managerwith Plan of Care  Patient       Patient will benefit from skilled therapeutic intervention in order to improve the following deficits and impairments:  Abnormal gait, Decreased mobility, Impaired tone, Decreased activity tolerance, Decreased endurance, Decreased strength, Decreased range of motion, Decreased balance, Difficulty walking, Impaired flexibility  Visit Diagnosis: Muscle weakness (generalized)  Other abnormalities of gait and mobility  Unsteadiness on feet     Problem List Patient Active Problem List   Diagnosis Date Noted  . Other constipation 01/17/2018  . Anemia, chronic disease 11/25/2017  . Goals of care, counseling/discussion 07/15/2017  . Genetic testing 08/17/2016  . Leukopenia due to antineoplastic chemotherapy ([place] 06/05/2016  .  Costochondritis, acute 05/02/2016  . Ovarian cancer ([place] 03/24/2016  . Anemia due to antineoplastic chemotherapy 03/17/2016  . Port catheter in place 03/17/2016  . Portacath in place 02/05/2016  . Chemotherapy-induced peripheral neuropathy ([place] 02/05/2016  . Hematoma 01/27/2016  . Chronic anticoagulation 01/22/2016  . Chronic atrial fibrillation ([place] 01/22/2016  . PICC (peripherally inserted central catheter) in place 01/22/2016  . Chemotherapy induced neutropenia ([place] 01/22/2016  . Poor venous access 01/22/2016  . CVA (cerebral vascular accident) ([place] 12/27/2015  . Right hemiparesis ([place] 12/27/2015  . Expressive dysphasia 12/27/2015  . History of left breast cancer 12/27/2015  . H/O tubal ligation 12/27/2015  . Colon polyps 12/27/2015  . International Federation of Gynecology and Obstetrics (FIGO) stage IVB epithelial ovarian cancer ([place] 12/27/2015  . Ovarian cancer, bilateral ([place] 12/24/2015  . Inguinal adenopathy   . Carcinomatosis ([place] 12/06/2015  . Essential hypertension, benign 07/18/2014  . A-fib ([place] 07/18/2014  . Anxiety state, unspecified 07/18/2014    Reesha Debes, PT 03/02/2018, 2:39 PM  [id number] [id number]: [phone]  Fax:  [phone] Name: Brenda RapozoMRN: [id number] of Birth: 811-Jun-1952

## 2018-03-02 NOTE — Therapy (Signed)
[facility] [address] [place], [place], [address] Phone: [phone]   Fax:  [phone]  Occupational Therapy Treatment  Patient Details  Name: Brenda Cunningham MRN: [id number] Date of Birth: 1951-07-26 Referring Provider: Lendon Collar MD   Encounter Date: 03/02/2018  OT End of Session - 03/02/18 1306    Visit Number  2    Number of Visits  17    Authorization Type  Medicare    OT Start Time  1019    OT Stop Time  1100    OT Time Calculation (min)  41 min    Behavior During Therapy  [facility] for tasks assessed/performed       Past Medical History:  Diagnosis Date  . A-fib ([place])   . Anxiety   . Arthritis   . Breast cancer ([place]) 2008   s/p lumpectomy, chemo and radiation therapy  . Complication of anesthesia    DIFFICULTY WAKING UP FROM ANESTHESIA  . Hyperlipidemia   . Hypertension   . Stroke ([place]) 07/29/2015  . Wears glasses     Past Surgical History:  Procedure Laterality Date  . APPENDECTOMY    . BREAST LUMPECTOMY  2008   left  . COLONOSCOPY  2004  . DEBULKING N/A 03/24/2016   Procedure: RADICAL TUMOR DEBULKING;  Surgeon: Everitt Amber, MD;  [facility]: [facility];  Service: Gynecology;  Laterality: N/A;  . OMENTECTOMY N/A 03/24/2016   Procedure: OMENTECTOMY;  Surgeon: Everitt Amber, MD;  Location: [facility];  Service: Gynecology;  Laterality: N/A;  . ROBOTIC ASSISTED TOTAL HYSTERECTOMY WITH BILATERAL SALPINGO OOPHERECTOMY Bilateral 03/24/2016   Procedure: XI ROBOTIC ASSISTED TOTA LAPAROSCOPIC  HYSTERECTOMY WITH BILATERAL SALPINGO OOPHORECTOMY;  Surgeon: Everitt Amber, MD;  [facility];  Service: Gynecology;  Laterality: Bilateral;  . TUBAL LIGATION      Vitals:   03/02/18 1021  BP: (!) 142/94  Pulse: (!) 111    Subjective Assessment - 03/02/18 1302    Subjective   Pt reports right shoulder pain    Pertinent History  CVA 2016, hx of ovarian CA, s/p chemotherapy     Patient Stated Goals  I want to work my hand     Currently in Pain?  Yes    Pain Score  2     Pain Orientation  Right    Pain Descriptors / Indicators  Aching    Pain Type  Chronic pain    Pain Onset  More than a month ago    Pain Frequency  Intermittent    Aggravating Factors   malpostioning    Pain Relieving Factors  heat    Multiple Pain Sites  No         Treatment: Pt was fitted with a custom resting hand splint while hotpack applied to right shoulder x 10 mins , no adverse reactions. Splint was not issued due to time constraints.                    OT Short Term Goals - 02/14/18 1307      OT SHORT TERM GOAL #1   Title  Pt will be mod I with HEP     Time  4    Period  Weeks    Status  New    Target Date  03/16/18      OT SHORT TERM GOAL #2   Title  Pt will verbalize understanding of RUE positioning to minimize risk for injury and contractures including splinting PRN.  Time  4    Period  Weeks    Status  New      OT SHORT TERM GOAL #3   Title  Pt will demonstrate 90* shoulder flexion for increased ease with ADLS/IADLs.    Time  4    Period  Weeks    Status  New      OT SHORT TERM GOAL #4   Title  Pt will be modified independent with all basic ADLS    Time  4    Status  New        OT Long Term Goals - 02/14/18 1309      OT LONG TERM GOAL #1   Title  Pt will use RUE as a  stabilizer/ gross A 25 % of the time for ADLs/IADLS.-04/15/18    Time  8    Period  Weeks    Status  New    Target Date  04/15/18      OT LONG TERM GOAL #2   Title  Pt will perform basic home management/ snack prep at a modified independent level.    Time  8    Period  Weeks    Status  New      OT LONG TERM GOAL #3   Title  Pt will verbalize understanding of AE/ DME to increase safety and ease with ADLS/IADLS.    Time  8    Period  Weeks    Status  New            Plan - 03/02/18 1303    Clinical Impression Statement  Therapist initiated fabication/ fitting of resting hand splint. Resting hand splint  was not issued due to time constraints. Therapist to educate pt in application next visit.    Occupational Profile and client history currently impacting functional performance  Pt lives with her spouse, she is retired. PMH: A-fib, Anxiety, arthritis, breast CA and ovarian CA , s/p chemo and radiation, hyperlipidemia, HTN, CVA 2016    Rehab Potential  Good    Current Impairments/barriers affecting progress:  length of time since onset    OT Frequency  2x / week    OT Duration  8 weeks    OT Treatment/Interventions  Self-care/ADL training;Electrical Stimulation;Therapeutic exercise;Visual/perceptual remediation/compensation;Patient/family education;Splinting;Neuromuscular education;Moist Heat;Paraffin;Fluidtherapy;Energy conservation;Therapeutic activities;Balance training;Cognitive remediation/compensation;Passive range of motion;Manual Therapy;DME and/or AE instruction;Contrast Bath;Cryotherapy    Plan  complete rsting hand splint and issue    Clinical Decision Making  Limited treatment options, no task modification necessary    Consulted and Agree with Plan of Care  Patient       Patient will benefit from skilled therapeutic intervention in order to improve the following deficits and impairments:  Abnormal gait, Decreased cognition, Decreased knowledge of use of DME, Impaired flexibility, Decreased mobility, Decreased coordination, Decreased activity tolerance, Decreased endurance, Decreased range of motion, Decreased strength, Impaired UE functional use, Impaired perceived functional ability, Decreased safety awareness, Decreased knowledge of precautions, Decreased balance, Difficulty walking  Visit Diagnosis: No diagnosis found.    Problem List Patient Active Problem List   Diagnosis Date Noted  . Other constipation 01/17/2018  . Anemia, chronic disease 11/25/2017  . Goals of care, counseling/discussion 07/15/2017  . Genetic testing 08/17/2016  . Leukopenia due to antineoplastic  chemotherapy ([place]) 06/05/2016  . Costochondritis, acute 05/02/2016  . Ovarian cancer ([place]) 03/24/2016  . Anemia due to antineoplastic chemotherapy 03/17/2016  . Port catheter in place 03/17/2016  . Portacath in place 02/05/2016  . Chemotherapy-induced peripheral  neuropathy ([place]) 02/05/2016  . Hematoma 01/27/2016  . Chronic anticoagulation 01/22/2016  . Chronic atrial fibrillation ([place]) 01/22/2016  . PICC (peripherally inserted central catheter) in place 01/22/2016  . Chemotherapy induced neutropenia ([place]) 01/22/2016  . Poor venous access 01/22/2016  . CVA (cerebral vascular accident) ([place]) 12/27/2015  . Right hemiparesis ([place]) 12/27/2015  . Expressive dysphasia 12/27/2015  . History of left breast cancer 12/27/2015  . H/O tubal ligation 12/27/2015  . Colon polyps 12/27/2015  . International Federation of Gynecology and Obstetrics (FIGO) stage IVB epithelial ovarian cancer ([place]) 12/27/2015  . Ovarian cancer, bilateral ([place]) 12/24/2015  . Inguinal adenopathy   . Carcinomatosis ([place]) 12/06/2015  . Essential hypertension, benign 07/18/2014  . A-fib ([place]) 07/18/2014  . Anxiety state, unspecified 07/18/2014    RINE,KATHRYN 03/02/2018, 1:06 PM Theone Murdoch, OTR/L [phone] Phone: [phone] 1:08 PM 03/02/18 [facility] [address] [place], [place], [address] Phone: [phone]   Fax:  [phone]  Name: Brenda Cunningham MRN: [id number] Date of Birth: 05-15-1951

## 2018-03-04 ENCOUNTER — Ambulatory Visit: Payer: Medicare Other | Admitting: Rehabilitative and Restorative Service Providers"

## 2018-03-04 ENCOUNTER — Ambulatory Visit: Payer: Medicare Other | Admitting: Occupational Therapy

## 2018-03-04 ENCOUNTER — Encounter: Payer: Self-pay | Admitting: Rehabilitative and Restorative Service Providers"

## 2018-03-04 ENCOUNTER — Telehealth: Payer: Self-pay | Admitting: Rehabilitative and Restorative Service Providers"

## 2018-03-04 VITALS — HR 105

## 2018-03-04 DIAGNOSIS — M25641 Stiffness of right hand, not elsewhere classified: Secondary | ICD-10-CM

## 2018-03-04 DIAGNOSIS — M6281 Muscle weakness (generalized): Secondary | ICD-10-CM

## 2018-03-04 DIAGNOSIS — R2681 Unsteadiness on feet: Secondary | ICD-10-CM

## 2018-03-04 DIAGNOSIS — I69353 Hemiplegia and hemiparesis following cerebral infarction affecting right non-dominant side: Secondary | ICD-10-CM

## 2018-03-04 DIAGNOSIS — R2689 Other abnormalities of gait and mobility: Secondary | ICD-10-CM

## 2018-03-04 DIAGNOSIS — M25611 Stiffness of right shoulder, not elsewhere classified: Secondary | ICD-10-CM

## 2018-03-04 NOTE — Therapy (Signed)
[facility] [address] [place], [place], [address] Phone: [phone]   Fax:  [phone]  Occupational Therapy Treatment  Patient Details  Name: Brenda Cunningham MRN: [id number] Date of Birth: August 29, 1951 Referring Provider: Lendon Collar MD   Encounter Date: 03/04/2018  OT End of Session - 03/04/18 1425    Visit Number  3    Number of Visits  17    Date for OT Re-Evaluation  04/15/18    Authorization Type  Medicare    OT Start Time  0935    OT Stop Time  1015    OT Time Calculation (min)  40 min    Behavior During Therapy  [facility] for tasks assessed/performed       Past Medical History:  Diagnosis Date  . A-fib ([place])   . Anxiety   . Arthritis   . Breast cancer ([place]) 2008   s/p lumpectomy, chemo and radiation therapy  . Complication of anesthesia    DIFFICULTY WAKING UP FROM ANESTHESIA  . Hyperlipidemia   . Hypertension   . Stroke ([place]) 07/29/2015  . Wears glasses     Past Surgical History:  Procedure Laterality Date  . APPENDECTOMY    . BREAST LUMPECTOMY  2008   left  . COLONOSCOPY  2004  . DEBULKING N/A 03/24/2016   Procedure: RADICAL TUMOR DEBULKING;  Surgeon: Everitt Amber, MD;  [facility];  Service: Gynecology;  Laterality: N/A;  . OMENTECTOMY N/A 03/24/2016   Procedure: OMENTECTOMY;  Surgeon: Everitt Amber, MD;  [facility];  Service: Gynecology;  Laterality: N/A;  . ROBOTIC ASSISTED TOTAL HYSTERECTOMY WITH BILATERAL SALPINGO OOPHERECTOMY Bilateral 03/24/2016   Procedure: XI ROBOTIC ASSISTED TOTA LAPAROSCOPIC  HYSTERECTOMY WITH BILATERAL SALPINGO OOPHORECTOMY;  Surgeon: Everitt Amber, MD;  [facility];  Service: Gynecology;  Laterality: Bilateral;  . TUBAL LIGATION      There were no vitals filed for this visit.          Treatment: Hotpack applied to right shoulder x 10 mins while therapist completed fitting of resting hand splint and pt education. No adverse reactions. Pt returned  demonstration of donning and doffing. She was instructed that she can not wear  splint during ambulation with walker. Seated edge of mat pt performed gentle self stretch reaching for floor with RUE.  Supine gentle AA/ROM shoulder flexion and joint mobs. Education performed regarding bed positioning to minimize RUE pain. Pt returned demonstration.              OT Education - 03/04/18 1426    Education provided  Yes    Education Details  splint wear, care and precautions, splint application, bed positioning to minimize shoulder pain with RUE supported on pillow in sidelying    Person(s) Educated  Patient    Methods  Explanation;Demonstration;Verbal cues;Handout    Comprehension  Verbalized understanding;Returned demonstration;Verbal cues required       OT Short Term Goals - 02/14/18 1307      OT SHORT TERM GOAL #1   Title  Pt will be mod I with HEP     Time  4    Period  Weeks    Status  New    Target Date  03/16/18      OT SHORT TERM GOAL #2   Title  Pt will verbalize understanding of RUE positioning to minimize risk for injury and contractures including splinting PRN.    Time  4    Period  Weeks  Status  New      OT SHORT TERM GOAL #3   Title  Pt will demonstrate 90* shoulder flexion for increased ease with ADLS/IADLs.    Time  4    Period  Weeks    Status  New      OT SHORT TERM GOAL #4   Title  Pt will be modified independent with all basic ADLS    Time  4    Status  New        OT Long Term Goals - 02/14/18 1309      OT LONG TERM GOAL #1   Title  Pt will use RUE as a  stabilizer/ gross A 25 % of the time for ADLs/IADLS.-04/15/18    Time  8    Period  Weeks    Status  New    Target Date  04/15/18      OT LONG TERM GOAL #2   Title  Pt will perform basic home management/ snack prep at a modified independent level.    Time  8    Period  Weeks    Status  New      OT LONG TERM GOAL #3   Title  Pt will verbalize understanding of AE/ DME to increase  safety and ease with ADLS/IADLS.    Time  8    Period  Weeks    Status  New            Plan - 03/04/18 1427    Clinical Impression Statement  Therapist completed pt's resting hand splint and educated her in wear, and care.    Rehab Potential  Good    Current Impairments/barriers affecting progress:  length of time since onset    OT Frequency  2x / week    OT Duration  8 weeks    OT Treatment/Interventions  Self-care/ADL training;Electrical Stimulation;Therapeutic exercise;Visual/perceptual remediation/compensation;Patient/family education;Splinting;Neuromuscular education;Moist Heat;Paraffin;Fluidtherapy;Energy conservation;Therapeutic activities;Balance training;Cognitive remediation/compensation;Passive range of motion;Manual Therapy;DME and/or AE instruction;Contrast Bath;Cryotherapy    Plan  splint check, check on bed positioning, HEP    Consulted and Agree with Plan of Care  Patient       Patient will benefit from skilled therapeutic intervention in order to improve the following deficits and impairments:     Visit Diagnosis: Muscle weakness (generalized)  Stiffness of right hand, not elsewhere classified  Hemiplegia and hemiparesis following cerebral infarction affecting right non-dominant side (HCC)  Stiffness of right shoulder, not elsewhere classified    Problem List Patient Active Problem List   Diagnosis Date Noted  . Other constipation 01/17/2018  . Anemia, chronic disease 11/25/2017  . Goals of care, counseling/discussion 07/15/2017  . Genetic testing 08/17/2016  . Leukopenia due to antineoplastic chemotherapy ([place]) 06/05/2016  . Costochondritis, acute 05/02/2016  . Ovarian cancer ([place]) 03/24/2016  . Anemia due to antineoplastic chemotherapy 03/17/2016  . Port catheter in place 03/17/2016  . Portacath in place 02/05/2016  . Chemotherapy-induced peripheral neuropathy ([place]) 02/05/2016  . Hematoma 01/27/2016  . Chronic anticoagulation 01/22/2016  .  Chronic atrial fibrillation ([place]) 01/22/2016  . PICC (peripherally inserted central catheter) in place 01/22/2016  . Chemotherapy induced neutropenia ([place]) 01/22/2016  . Poor venous access 01/22/2016  . CVA (cerebral vascular accident) ([place]) 12/27/2015  . Right hemiparesis ([place]) 12/27/2015  . Expressive dysphasia 12/27/2015  . History of left breast cancer 12/27/2015  . H/O tubal ligation 12/27/2015  . Colon polyps 12/27/2015  . International Federation of Gynecology and Obstetrics (FIGO) stage IVB epithelial ovarian  cancer ([place]) 12/27/2015  . Ovarian cancer, bilateral ([place]) 12/24/2015  . Inguinal adenopathy   . Carcinomatosis ([place]) 12/06/2015  . Essential hypertension, benign 07/18/2014  . A-fib ([place]) 07/18/2014  . Anxiety state, unspecified 07/18/2014    RINE,KATHRYN 03/04/2018, 2:29 PM  [facility] [address] [place], [place], [address] Phone: [phone]   Fax:  [phone]  Name: Navreet Bolda MRN: [id number] Date of Birth: 12-Aug-1951

## 2018-03-04 NOTE — Telephone Encounter (Signed)
PT contacted Dr. Bonnielee Haff office by phone during Brenda Cunningham scheduled PT visit due to resting HR being elevated to 105-108.  At last session on 03/02/18, the patient's HR was 155 after walking x 230 ft.  It took 10 minutes for HR to return to low 100's before leaving.  Therefore PT monitored baseline today and noted it is elevated after extensive rest.   Spoke with front office, then Sharyn Lull (Dr. Bonnielee Haff nurse) and she was going to check with MD and call patient's daughter, Brenda Cunningham, to schedule visit as indicated.  PT not going to exercise due to elevated HR first noted at 3/27 appt and today at rest, recommend patient see MD before further PT for evaluation.  No acute distress for patient, just notices getting tired easily with standing/walking.  Brenda Cunningham, PT

## 2018-03-04 NOTE — Therapy (Signed)
[facility] [address] [place], [place], [address] Phone: [phone]   Fax:  [phone]  Physical Therapy Treatment  Patient Details  Name: Brenda Cunningham MRN: [id number] Date of Birth: [birth year] Referring Provider: Lendon Collar MD   Encounter Date: 03/04/2018  PT End of Session - 03/04/18 1047    Visit Number  10    Number of Visits  17 eval +16 visits    Date for PT Re-Evaluation  03/22/18    Authorization Type  medicare    PT Start Time  1020    PT Stop Time  1050 PT only treated for 15 minutes of session    PT Time Calculation (min)  30 min    Equipment Utilized During Treatment  Gait belt    Activity Tolerance  Patient tolerated treatment well    Behavior During Therapy  [facility] for tasks assessed/performed       Past Medical History:  Diagnosis Date  . A-fib ([place])   . Anxiety   . Arthritis   . Breast cancer ([place]) 2008   s/p lumpectomy, chemo and radiation therapy  . Complication of anesthesia    DIFFICULTY WAKING UP FROM ANESTHESIA  . Hyperlipidemia   . Hypertension   . Stroke ([place]) 07/29/2015  . Wears glasses     Past Surgical History:  Procedure Laterality Date  . APPENDECTOMY    . BREAST LUMPECTOMY  2008   left  . COLONOSCOPY  2004  . DEBULKING N/A 03/24/2016   Procedure: RADICAL TUMOR DEBULKING;  Surgeon: Everitt Amber, MD;  [facility];  Service: Gynecology;  Laterality: N/A;  . OMENTECTOMY N/A 03/24/2016   Procedure: OMENTECTOMY;  Surgeon: Everitt Amber, MD;  [facility];  Service: Gynecology;  Laterality: N/A;  . ROBOTIC ASSISTED TOTAL HYSTERECTOMY WITH BILATERAL SALPINGO OOPHERECTOMY Bilateral 03/24/2016   Procedure: XI ROBOTIC ASSISTED TOTA LAPAROSCOPIC  HYSTERECTOMY WITH BILATERAL SALPINGO OOPHORECTOMY;  Surgeon: Everitt Amber, MD;  [facility];  Service: Gynecology;  Laterality: Bilateral;  . TUBAL LIGATION      Vitals:   03/04/18 1023  Pulse: (!) 105    Subjective  Assessment - 03/04/18 1023    Subjective  "You worked me so hard last time" noting it hurt her rihgt arm.  She thinks she is moving her arm different when walking without the walker and notes this contributes to R arm pain.      Patient Stated Goals  Has not been able to get in/out of truck since beginning chemotherapy (months ago).     Currently in Pain?  Yes    Pain Score  5     Pain Location  Arm    Pain Orientation  Right    Pain Descriptors / Indicators  Aching    Pain Type  Chronic pain    Pain Onset  More than a month ago    Pain Frequency  Constant    Aggravating Factors   Began after therapy on 3/27 (Wednesday)    Pain Relieving Factors  gentle massage feels better; pain pills                 [facility] Adult PT Treatment/Exercise - 03/04/18 1222      Self-Care   Self-Care  Other Self-Care Comments    Other Self-Care Comments   Discussed safety concerns with exercise with elevated HR and recommended we contact MD office for follow-up.      Exercises   Exercises  Other Exercises  Other Exercises   Lateral trunk stretch in sitting with patient L elbow propping with PT providing passive overpressure R lateral trunk region.       Manual Therapy   Manual Therapy  Soft tissue mobilization    Manual therapy comments  for stretching and pain reduction    Soft tissue mobilization  Patient notes "good pain" with palpation and soft tissue mobilization R serratur anterior, R intercostal musculature.               PT Short Term Goals - 02/18/18 8756      PT SHORT TERM GOAL #1   Title  The patient will perform HEP with assist from caregiver.      Time  4    Period  Weeks    Status  Achieved      PT SHORT TERM GOAL #2   Title  The patient will improve Berg from 11/56 to > or equal to 16/56 to demonstrate improved steady state standing for ADLs.    Baseline  27/56 on 02/18/18    Time  4    Period  Weeks    Status  Achieved      PT SHORT TERM GOAL #3   Title  The  patient will move sit<>stand from mat height surface with SBA.    Baseline  mod A from mat sit<>stand to RW at eval.  On 02/11/2018:  close supervision for sit<>stand x 10 reps.    Time  4    Period  Weeks    Status  Achieved      PT SHORT TERM GOAL #4   Title  The patient will improve gait speed from 1.95 ft/sec to > or equal to 2.3 ft/sec to demonstrate improving functional mobility.    Baseline  2.58 ft/sec     Time  4    Period  Weeks    Status  Achieved      PT SHORT TERM GOAL #5   Title  The patient will ambulate x 400 feet nonstop with least restrictive assistive device mod indep for improved household mobility.    Baseline  345 ft nonstop with supervision today using RW.    Time  4    Period  Weeks    Status  Partially Met        PT Long Term Goals - 03/02/18 1436      PT LONG TERM GOAL #1   Title  The patient will report eating meals at kitchen table 2 out of 3 meals/day to demo increasing walking in home for long term mobility.    Time  8    Period  Weeks    Target Date  03/22/18      PT LONG TERM GOAL #2   Title  The patient will improve Berg from 11/56 to > or equal to 20/56 to demonstrate improving static balance for ADLs.    Baseline  *see short term goals    Time  8    Period  Weeks    Status  Achieved      PT LONG TERM GOAL #3   Title  The patient will move sit<>stand from chair with arm rests and mat height surfaces mod indep to assistive device 5/5 times for improved mobility.    Time  8    Period  Weeks      PT LONG TERM GOAL #4   Title  The patient will negotiate 4 steps mod indep with step  to pattern descending for household entry/exit.    Time  8    Period  Weeks      PT LONG TERM GOAL #5   Title  Pt will ambulate 569f with LRAD and supervision outdoors over level and unlevel paved surfaces, ramps, and curbs to incr functional mobility and safety while ambulating in the community.    Time  8    Period  Weeks            Plan - 03/04/18  1224    Clinical Impression Statement  The patient's session ended early due elevated HR (noted on 3/27 and today).  PT called MD office *see telephone note*.  They returned call to note they would f/u with patient to schedule an appt with MD.     PT Treatment/Interventions  ADLs/Self Care Home Management;Gait training;Stair training;Functional mobility training;Therapeutic activities;Therapeutic exercise;Patient/family education;Balance training;Neuromuscular re-education;Orthotic Fit/Training    PT Next Visit Plan  Monitor heart rate to get baseline and exertional HR. Continue working on LTGs getting blue rocker .  *progress to LTGs.  PATIENT SHOULD SEE MD BEFORE RETURNING TO PT.    Consulted and Agree with Plan of Care  Patient       Patient will benefit from skilled therapeutic intervention in order to improve the following deficits and impairments:  Abnormal gait, Decreased mobility, Impaired tone, Decreased activity tolerance, Decreased endurance, Decreased strength, Decreased range of motion, Decreased balance, Difficulty walking, Impaired flexibility  Visit Diagnosis: Muscle weakness (generalized)  Other abnormalities of gait and mobility  Unsteadiness on feet     Problem List Patient Active Problem List   Diagnosis Date Noted  . Other constipation 01/17/2018  . Anemia, chronic disease 11/25/2017  . Goals of care, counseling/discussion 07/15/2017  . Genetic testing 08/17/2016  . Leukopenia due to antineoplastic chemotherapy ([place] 06/05/2016  . Costochondritis, acute 05/02/2016  . Ovarian cancer ([place] 03/24/2016  . Anemia due to antineoplastic chemotherapy 03/17/2016  . Port catheter in place 03/17/2016  . Portacath in place 02/05/2016  . Chemotherapy-induced peripheral neuropathy ([place] 02/05/2016  . Hematoma 01/27/2016  . Chronic anticoagulation 01/22/2016  . Chronic atrial fibrillation ([place] 01/22/2016  . PICC (peripherally inserted central catheter) in place 01/22/2016   . Chemotherapy induced neutropenia ([place] 01/22/2016  . Poor venous access 01/22/2016  . CVA (cerebral vascular accident) ([place] 12/27/2015  . Right hemiparesis ([place] 12/27/2015  . Expressive dysphasia 12/27/2015  . History of left breast cancer 12/27/2015  . H/O tubal ligation 12/27/2015  . Colon polyps 12/27/2015  . International Federation of Gynecology and Obstetrics (FIGO) stage IVB epithelial ovarian cancer ([place] 12/27/2015  . Ovarian cancer, bilateral ([place] 12/24/2015  . Inguinal adenopathy   . Carcinomatosis ([place] 12/06/2015  . Essential hypertension, benign 07/18/2014  . A-fib ([place] 07/18/2014  . Anxiety state, unspecified 07/18/2014    Imajean Mcdermid,PT 03/04/2018, 12:26 PM  [facility] [address]Phone: [phone]  Fax:  [phone] Name: EMetztli SachdevMRN: [id number] of Birth: 8April 23, 1952

## 2018-03-04 NOTE — Patient Instructions (Signed)
Your Splint This splint should initially be fitted by a healthcare practitioner.  The healthcare practitioner is responsible for providing wearing instructions and precautions to the patient, other healthcare practitioners and care provider involved in the patient's care.  This splint was custom made for you. Please read the following instructions to learn about wearing and caring for your splint.  Precautions Should your splint cause any of the following problems, remove the splint immediately and contact your therapist/physician.  Swelling  Severe Pain  Pressure Areas  Stiffness  Numbness  Do not wear your splint while operating machinery unless it has been fabricated for that purpose.  When To Wear Your Splint Where your splint according to your therapist/physician instructions. Daytime for 1-2 hours while at rest, do not attempt to get up and walk  With your walker while wearing splint! If no problems you can wear your splint for several hours, if you are able to tolerate splint for several hours without difficulty, then progress to wearing it at night  Care and Cleaning of Your Splint 1. Keep your splint away from open flames. 2. Your splint will lose its shape in temperatures over 135 degrees Farenheit, ( in car windows, near radiators, ovens or in hot water).  Never make any adjustments to your splint, if the splint needs adjusting remove it and make an appointment to see your therapist. 3. Your splint, including the cushion liner may be cleaned with soap and lukewarm water.  Do not immerse in hot water over 135 degrees Farenheit. 4. Straps may be washed with soap and water, but do not moisten the self-adhesive portion.

## 2018-03-07 ENCOUNTER — Ambulatory Visit: Payer: Medicare Other | Admitting: Rehabilitative and Restorative Service Providers"

## 2018-03-07 ENCOUNTER — Ambulatory Visit: Payer: Medicare Other | Admitting: Occupational Therapy

## 2018-03-09 ENCOUNTER — Ambulatory Visit: Payer: Medicare Other | Attending: Family Medicine | Admitting: Occupational Therapy

## 2018-03-09 ENCOUNTER — Ambulatory Visit: Payer: Medicare Other | Admitting: Rehabilitative and Restorative Service Providers"

## 2018-03-09 ENCOUNTER — Other Ambulatory Visit: Payer: Self-pay | Admitting: Occupational Therapy

## 2018-03-09 DIAGNOSIS — R41844 Frontal lobe and executive function deficit: Secondary | ICD-10-CM | POA: Insufficient documentation

## 2018-03-09 DIAGNOSIS — R2689 Other abnormalities of gait and mobility: Secondary | ICD-10-CM | POA: Insufficient documentation

## 2018-03-09 DIAGNOSIS — M25611 Stiffness of right shoulder, not elsewhere classified: Secondary | ICD-10-CM | POA: Diagnosis present

## 2018-03-09 DIAGNOSIS — M25641 Stiffness of right hand, not elsewhere classified: Secondary | ICD-10-CM

## 2018-03-09 DIAGNOSIS — I69353 Hemiplegia and hemiparesis following cerebral infarction affecting right non-dominant side: Secondary | ICD-10-CM | POA: Diagnosis present

## 2018-03-09 DIAGNOSIS — R2681 Unsteadiness on feet: Secondary | ICD-10-CM | POA: Insufficient documentation

## 2018-03-09 DIAGNOSIS — M6281 Muscle weakness (generalized): Secondary | ICD-10-CM | POA: Insufficient documentation

## 2018-03-09 NOTE — Therapy (Signed)
[facility] [address] [place], [place], [address] Phone: [phone]   Fax:  [phone]  Occupational Therapy Treatment  Patient Details  Name: Brenda Cunningham MRN: [id number] Date of Birth: 1951-09-09 Referring Provider: Lendon Collar MD   Encounter Date: 03/09/2018  OT End of Session - 03/09/18 1139    Visit Number  4    Number of Visits  17    Authorization Type  Medicare    OT Start Time  1105    OT Stop Time  1145    OT Time Calculation (min)  40 min    Behavior During Therapy  [facility] for tasks assessed/performed       Past Medical History:  Diagnosis Date  . A-fib ([place])   . Anxiety   . Arthritis   . Breast cancer ([place]) 2008   s/p lumpectomy, chemo and radiation therapy  . Complication of anesthesia    DIFFICULTY WAKING UP FROM ANESTHESIA  . Hyperlipidemia   . Hypertension   . Stroke ([place]) 07/29/2015  . Wears glasses     Past Surgical History:  Procedure Laterality Date  . APPENDECTOMY    . BREAST LUMPECTOMY  2008   left  . COLONOSCOPY  2004  . DEBULKING N/A 03/24/2016   Procedure: RADICAL TUMOR DEBULKING;  Surgeon: Everitt Amber, MD;  Location: [facility];  Service: Gynecology;  Laterality: N/A;  . OMENTECTOMY N/A 03/24/2016   Procedure: OMENTECTOMY;  Surgeon: Everitt Amber, MD;  Location: [facility];  Service: Gynecology;  Laterality: N/A;  . ROBOTIC ASSISTED TOTAL HYSTERECTOMY WITH BILATERAL SALPINGO OOPHERECTOMY Bilateral 03/24/2016   Procedure: XI ROBOTIC ASSISTED TOTA LAPAROSCOPIC  HYSTERECTOMY WITH BILATERAL SALPINGO OOPHORECTOMY;  Surgeon: Everitt Amber, MD;  Location: [facility];  Service: Gynecology;  Laterality: Bilateral;  . TUBAL LIGATION      There were no vitals filed for this visit.  Subjective Assessment - 03/09/18 1107    Subjective   Denies pain    Patient Stated Goals  I want to work my hand    Currently in Pain?  No/denies                   Treatment: Ambulating with rollator for  practice with safety and locking / unlocking brakes as pt reports she purchased one. Pt requires additional practice for safety. Pt attempted donning socks yet she required max A. Donning right shoe with shoehorn and mod A.         OT Education - 03/09/18 2016    Education provided  Yes    Education Details  cane exercises in supine    Person(s) Educated  Patient    Methods  Explanation;Demonstration;Verbal cues;Handout    Comprehension  Verbalized understanding;Returned demonstration;Verbal cues required       OT Short Term Goals - 02/14/18 1307      OT SHORT TERM GOAL #1   Title  Pt will be mod I with HEP     Time  4    Period  Weeks    Status  New    Target Date  03/16/18      OT SHORT TERM GOAL #2   Title  Pt will verbalize understanding of RUE positioning to minimize risk for injury and contractures including splinting PRN.    Time  4    Period  Weeks    Status  New      OT SHORT TERM GOAL #3   Title  Pt will demonstrate 90* shoulder  flexion for increased ease with ADLS/IADLs.    Time  4    Period  Weeks    Status  New      OT SHORT TERM GOAL #4   Title  Pt will be modified independent with all basic ADLS    Time  4    Status  New        OT Long Term Goals - 02/14/18 1309      OT LONG TERM GOAL #1   Title  Pt will use RUE as a  stabilizer/ gross A 25 % of the time for ADLs/IADLS.-04/15/18    Time  8    Period  Weeks    Status  New    Target Date  04/15/18      OT LONG TERM GOAL #2   Title  Pt will perform basic home management/ snack prep at a modified independent level.    Time  8    Period  Weeks    Status  New      OT LONG TERM GOAL #3   Title  Pt will verbalize understanding of AE/ DME to increase safety and ease with ADLS/IADLS.    Time  8    Period  Weeks    Status  New            Plan - 03/09/18 2015    Clinical Impression Statement  Pt is progressing towards goals. She verbalizes understanding of HEP.    Occupational Profile  and client history currently impacting functional performance  Pt lives with her spouse, she is retired. PMH: A-fib, Anxiety, arthritis, breast CA and ovarian CA , s/p chemo and radiation, hyperlipidemia, HTN, CVA 2016    Occupational performance deficits (Please refer to evaluation for details):  ADL's;IADL's;Leisure;Social Participation    Rehab Potential  Good    OT Frequency  2x / week    OT Duration  8 weeks    OT Treatment/Interventions  Self-care/ADL training;Electrical Stimulation;Therapeutic exercise;Visual/perceptual remediation/compensation;Patient/family education;Splinting;Neuromuscular education;Moist Heat;Paraffin;Fluidtherapy;Energy conservation;Therapeutic activities;Balance training;Cognitive remediation/compensation;Passive range of motion;Manual Therapy;DME and/or AE instruction;Contrast Bath;Cryotherapy    Plan  review HEP, NMR    Consulted and Agree with Plan of Care  Patient       Patient will benefit from skilled therapeutic intervention in order to improve the following deficits and impairments:  Abnormal gait, Decreased cognition, Decreased knowledge of use of DME, Impaired flexibility, Decreased mobility, Decreased coordination, Decreased activity tolerance, Decreased endurance, Decreased range of motion, Decreased strength, Impaired UE functional use, Impaired perceived functional ability, Decreased safety awareness, Decreased knowledge of precautions, Decreased balance, Difficulty walking  Visit Diagnosis: Muscle weakness (generalized)  Stiffness of right hand, not elsewhere classified  Hemiplegia and hemiparesis following cerebral infarction affecting right non-dominant side (HCC)  Stiffness of right shoulder, not elsewhere classified    Problem List Patient Active Problem List   Diagnosis Date Noted  . Other constipation 01/17/2018  . Anemia, chronic disease 11/25/2017  . Goals of care, counseling/discussion 07/15/2017  . Genetic testing 08/17/2016  .  Leukopenia due to antineoplastic chemotherapy ([place]) 06/05/2016  . Costochondritis, acute 05/02/2016  . Ovarian cancer ([place]) 03/24/2016  . Anemia due to antineoplastic chemotherapy 03/17/2016  . Port catheter in place 03/17/2016  . Portacath in place 02/05/2016  . Chemotherapy-induced peripheral neuropathy ([place]) 02/05/2016  . Hematoma 01/27/2016  . Chronic anticoagulation 01/22/2016  . Chronic atrial fibrillation ([place]) 01/22/2016  . PICC (peripherally inserted central catheter) in place 01/22/2016  . Chemotherapy induced neutropenia ([place]) 01/22/2016  .  Poor venous access 01/22/2016  . CVA (cerebral vascular accident) ([place]) 12/27/2015  . Right hemiparesis ([place]) 12/27/2015  . Expressive dysphasia 12/27/2015  . History of left breast cancer 12/27/2015  . H/O tubal ligation 12/27/2015  . Colon polyps 12/27/2015  . International Federation of Gynecology and Obstetrics (FIGO) stage IVB epithelial ovarian cancer ([place]) 12/27/2015  . Ovarian cancer, bilateral ([place]) 12/24/2015  . Inguinal adenopathy   . Carcinomatosis ([place]) 12/06/2015  . Essential hypertension, benign 07/18/2014  . A-fib ([place]) 07/18/2014  . Anxiety state, unspecified 07/18/2014    Jamilia Jacques 03/09/2018, 8:28 PM  [facility] [address] [place], [place], [address] Phone: [phone]   Fax:  [phone]  Name: Rickia [place] MRN: [id number] Date of Birth: 1951/10/14

## 2018-03-09 NOTE — Patient Instructions (Signed)
   Lie on back holding wand. Raise arms over head. Hold 5sec. Repeat 10 times per set.  Do 2-3 sessions per day.       Press wand up until elbows are straight, then reach wand over head to a pain free range. Hold 5 seconds. Repeat 10 times. Do 2-3 sessions per day.

## 2018-03-13 ENCOUNTER — Other Ambulatory Visit: Payer: Self-pay | Admitting: Hematology and Oncology

## 2018-03-14 DIAGNOSIS — H269 Unspecified cataract: Secondary | ICD-10-CM | POA: Insufficient documentation

## 2018-03-14 DIAGNOSIS — H35039 Hypertensive retinopathy, unspecified eye: Secondary | ICD-10-CM | POA: Insufficient documentation

## 2018-03-15 MED FILL — ZEJULA 100 MG CAPS: 100 | 30 days supply | Qty: 90 | Fill #4

## 2018-03-16 ENCOUNTER — Ambulatory Visit: Payer: Medicare Other | Admitting: Occupational Therapy

## 2018-03-16 ENCOUNTER — Ambulatory Visit: Payer: Medicare Other | Admitting: Rehabilitative and Restorative Service Providers"

## 2018-03-18 ENCOUNTER — Ambulatory Visit: Payer: Medicare Other | Admitting: Occupational Therapy

## 2018-03-18 ENCOUNTER — Ambulatory Visit: Payer: Medicare Other | Admitting: Rehabilitative and Restorative Service Providers"

## 2018-03-21 ENCOUNTER — Ambulatory Visit: Payer: Medicare Other | Admitting: Occupational Therapy

## 2018-03-21 ENCOUNTER — Ambulatory Visit: Payer: Medicare Other | Admitting: Rehabilitative and Restorative Service Providers"

## 2018-03-21 ENCOUNTER — Encounter: Payer: Self-pay | Admitting: Occupational Therapy

## 2018-03-21 ENCOUNTER — Encounter: Payer: Self-pay | Admitting: Rehabilitative and Restorative Service Providers"

## 2018-03-21 VITALS — HR 98

## 2018-03-21 DIAGNOSIS — M6281 Muscle weakness (generalized): Secondary | ICD-10-CM

## 2018-03-21 DIAGNOSIS — R2681 Unsteadiness on feet: Secondary | ICD-10-CM

## 2018-03-21 DIAGNOSIS — I69353 Hemiplegia and hemiparesis following cerebral infarction affecting right non-dominant side: Secondary | ICD-10-CM

## 2018-03-21 DIAGNOSIS — M25641 Stiffness of right hand, not elsewhere classified: Secondary | ICD-10-CM

## 2018-03-21 DIAGNOSIS — R2689 Other abnormalities of gait and mobility: Secondary | ICD-10-CM

## 2018-03-21 DIAGNOSIS — M25611 Stiffness of right shoulder, not elsewhere classified: Secondary | ICD-10-CM

## 2018-03-21 DIAGNOSIS — R41844 Frontal lobe and executive function deficit: Secondary | ICD-10-CM

## 2018-03-21 NOTE — Therapy (Signed)
[facility] [address] [place], [place], [address] Phone: [phone]   Fax:  [phone]  Occupational Therapy Treatment  Patient Details  Name: Brenda Cunningham MRN: [id number] Date of Birth: 03-25-1951 Referring Provider: Lendon Collar MD   Encounter Date: 03/21/2018  OT End of Session - 03/21/18 1428    Visit Number  5    Number of Visits  17    Date for OT Re-Evaluation  04/15/18    OT Start Time  1315    OT Stop Time  1400    OT Time Calculation (min)  45 min    Activity Tolerance  Patient tolerated treatment well    Behavior During Therapy  [facility] for tasks assessed/performed       Past Medical History:  Diagnosis Date  . A-fib ([place])   . Anxiety   . Arthritis   . Breast cancer ([place]) 2008   s/p lumpectomy, chemo and radiation therapy  . Complication of anesthesia    DIFFICULTY WAKING UP FROM ANESTHESIA  . Hyperlipidemia   . Hypertension   . Stroke ([place]) 07/29/2015  . Wears glasses     Past Surgical History:  Procedure Laterality Date  . APPENDECTOMY    . BREAST LUMPECTOMY  2008   left  . COLONOSCOPY  2004  . DEBULKING N/A 03/24/2016   Procedure: RADICAL TUMOR DEBULKING;  Surgeon: Brenda Amber, MD;  [facility];  Service: Gynecology;  Laterality: N/A;  . OMENTECTOMY N/A 03/24/2016   Procedure: OMENTECTOMY;  Surgeon: Brenda Amber, MD;  [facility];  Service: Gynecology;  Laterality: N/A;  . ROBOTIC ASSISTED TOTAL HYSTERECTOMY WITH BILATERAL SALPINGO OOPHERECTOMY Bilateral 03/24/2016   Procedure: XI ROBOTIC ASSISTED TOTA LAPAROSCOPIC  HYSTERECTOMY WITH BILATERAL SALPINGO OOPHORECTOMY;  Surgeon: Brenda Amber, MD;  [facility];  Service: Gynecology;  Laterality: Bilateral;  . TUBAL LIGATION      There were no vitals filed for this visit.  Subjective Assessment - 03/21/18 1324    Subjective   I made two cakes!    Pertinent History  CVA 2016, hx of ovarian CA, s/p chemotherapy     Patient Stated  Goals  I want to work my hand    Currently in Pain?  No/denies    Pain Score  0-No pain                   OT Treatments/Exercises (OP) - 03/21/18 1419      ADLs   LB Dressing  Patient reports that her daughter is still helpin gher don her socks.  Practiced a few different techniques to don socks, and patient was able to complete without physical assistance - initally with verbal cueing and demonstration, then with just increased time.  Patient has assistance for tying shoes.  Issued elastic shoelaces and patient able to don shoes without further assistance.      Cooking  Patient reports that she made two cakes with her boyfriend's help only to break the eggs.        Neurological Re-education Exercises   Other Exercises 1  Reviewed Home exercise program.  Patient needed cueing to perform slower, but able to complete exercises issued last session.      Other Exercises 2  Patient rewquesting to be done with OT as she is being discharged from PT and is feeling really good about current status.  Had long conversation about slowing down, safety with rollator - only using for long walking, e.g. [employer].  Patient adamanat that she would like to be d/c'd from OT.  Reviewed short and long term goals.               OT Education - 03/21/18 1424    Education provided  Yes    Education Details  Reviewed short and long term goals, reviewed HEP    Methods  Explanation;Demonstration;Verbal cues    Comprehension  Verbalized understanding       OT Short Term Goals - 03/21/18 1426      OT SHORT TERM GOAL #1   Title  [name] be mod I with HEP     Status  Achieved      OT SHORT TERM GOAL #2   Title  [name] verbalize understanding of RUE positioning to minimize risk for injury and contractures including splinting PRN.    Status  Partially Met      OT SHORT TERM GOAL #3   Title  [name] demonstrate 90* shoulder flexion for increased ease with ADLS/IADLs.    Status  Achieved      OT  SHORT TERM GOAL #4   Title  [name] be modified independent with all basic ADLS    Status  Partially Met        OT Long Term Goals - 03/21/18 1427      OT LONG TERM GOAL #1   Title  [name] use RUE as a  stabilizer/ gross A 25 % of the time for ADLs/IADLS.-04/15/18    Status  Not Met      OT LONG TERM GOAL #2   Title  [name] perform basic home management/ snack prep at a modified independent level.    Status  Partially Met Can obtain snack item- has difficulty opening packages      OT LONG TERM GOAL #3   Title  [name] verbalize understanding of AE/ DME to increase safety and ease with ADLS/IADLS.    Status  Partially Met            Plan - 03/21/18 1425    Clinical Impression Statement  Patient requesting early discharge from OT.      Occupational Profile and client history currently impacting functional performance  Pt lives with her spouse, she is retired. PMH: A-fib, Anxiety, arthritis, breast CA and ovarian CA , s/p chemo and radiation, hyperlipidemia, HTN, CVA 2016    Occupational performance deficits (Please refer to evaluation for details):  ADL's;IADL's;Leisure;Social Participation    Rehab Potential  Good    Current Impairments/barriers affecting progress:  length of time since onset    OT Frequency  2x / week    OT Duration  8 weeks    OT Treatment/Interventions  Self-care/ADL training;Electrical Stimulation;Therapeutic exercise;Visual/perceptual remediation/compensation;Patient/family education;Splinting;Neuromuscular education;Moist Heat;Paraffin;Fluidtherapy;Energy conservation;Therapeutic activities;Balance training;Cognitive remediation/compensation;Passive range of motion;Manual Therapy;DME and/or AE instruction;Contrast Bath;Cryotherapy    Plan  Discharge from OT    Clinical Decision Making  Limited treatment options, no task modification necessary    Consulted and Agree with Plan of Care  Patient       Patient Cunningham benefit from skilled therapeutic  intervention in order to improve the following deficits and impairments:  Abnormal gait, Decreased cognition, Decreased knowledge of use of DME, Impaired flexibility, Decreased mobility, Decreased coordination, Decreased activity tolerance, Decreased endurance, Decreased range of motion, Decreased strength, Impaired UE functional use, Impaired perceived functional ability, Decreased safety awareness, Decreased knowledge of precautions, Decreased balance, Difficulty walking  Visit Diagnosis: Muscle weakness (generalized)  Stiffness of right hand, not elsewhere classified  Hemiplegia and hemiparesis following cerebral infarction affecting right non-dominant side (HCC)  Stiffness of right shoulder, not elsewhere classified  Unsteadiness on feet  Frontal lobe and executive function deficit    Problem List Patient Active Problem List   Diagnosis Date Noted  . Other constipation 01/17/2018  . Anemia, chronic disease 11/25/2017  . Goals of care, counseling/discussion 07/15/2017  . Genetic testing 08/17/2016  . Leukopenia due to antineoplastic chemotherapy ([place]) 06/05/2016  . Costochondritis, acute 05/02/2016  . Ovarian cancer ([place]) 03/24/2016  . Anemia due to antineoplastic chemotherapy 03/17/2016  . Port catheter in place 03/17/2016  . Portacath in place 02/05/2016  . Chemotherapy-induced peripheral neuropathy ([place]) 02/05/2016  . Hematoma 01/27/2016  . Chronic anticoagulation 01/22/2016  . Chronic atrial fibrillation ([place]) 01/22/2016  . PICC (peripherally inserted central catheter) in place 01/22/2016  . Chemotherapy induced neutropenia ([place]) 01/22/2016  . Poor venous access 01/22/2016  . CVA (cerebral vascular accident) ([place]) 12/27/2015  . Right hemiparesis ([place]) 12/27/2015  . Expressive dysphasia 12/27/2015  . History of left breast cancer 12/27/2015  . H/O tubal ligation 12/27/2015  . Colon polyps 12/27/2015  . International Federation of Gynecology and Obstetrics (FIGO) stage  IVB epithelial ovarian cancer ([place]) 12/27/2015  . Ovarian cancer, bilateral ([place]) 12/24/2015  . Inguinal adenopathy   . Carcinomatosis ([place]) 12/06/2015  . Essential hypertension, benign 07/18/2014  . A-fib ([place]) 07/18/2014  . Anxiety state, unspecified 07/18/2014   OCCUPATIONAL THERAPY DISCHARGE SUMMARY  Visits from Start of Care: 5  Current functional level related to goals / functional outcomes: Min assist with aspects of ADL   Remaining deficits: Right hemiplegia  Education / Equipment: HEP, Elastic shoelaces Plan: Patient agrees to discharge.  Patient goals were partially met. Patient is being discharged due to being pleased with the current functional level.  ?????       Mariah Milling, OTR/L 03/21/2018, 2:29 PM  [facility] [address] [place], [place], [address] Phone: [phone]   Fax:  [phone]  Name: Brenda Cunningham MRN: [id number] Date of Birth: 20-Apr-1951

## 2018-03-21 NOTE — Therapy (Signed)
[facility] [address] [place], [place], [address] Phone: [phone]   Fax:  [phone]  Physical Therapy Treatment and Discharge Summary  Patient Details  Name: Brenda Cunningham MRN: [id number] Date of Birth: Apr 28, 1951 Referring Provider: Lendon Collar MD   Encounter Date: 03/21/2018  PT End of Session - 03/21/18 1239    Visit Number  11    Number of Visits  17 eval +16 visits    Date for PT Re-Evaluation  03/22/18    Authorization Type  medicare    PT Start Time  1232    PT Stop Time  1313    PT Time Calculation (min)  41 min    Equipment Utilized During Treatment  Gait belt    Activity Tolerance  Patient tolerated treatment well    Behavior During Therapy  [facility] for tasks assessed/performed       Past Medical History:  Diagnosis Date  . A-fib ([place])   . Anxiety   . Arthritis   . Breast cancer ([place]) 2008   s/p lumpectomy, chemo and radiation therapy  . Complication of anesthesia    DIFFICULTY WAKING UP FROM ANESTHESIA  . Hyperlipidemia   . Hypertension   . Stroke ([place]) 07/29/2015  . Wears glasses     Past Surgical History:  Procedure Laterality Date  . APPENDECTOMY    . BREAST LUMPECTOMY  2008   left  . COLONOSCOPY  2004  . DEBULKING N/A 03/24/2016   Procedure: RADICAL TUMOR DEBULKING;  Surgeon: Everitt Amber, MD;  Location: [facility];  Service: Gynecology;  Laterality: N/A;  . OMENTECTOMY N/A 03/24/2016   Procedure: OMENTECTOMY;  Surgeon: Everitt Amber, MD;  Location: [facility];  Service: Gynecology;  Laterality: N/A;  . ROBOTIC ASSISTED TOTAL HYSTERECTOMY WITH BILATERAL SALPINGO OOPHERECTOMY Bilateral 03/24/2016   Procedure: XI ROBOTIC ASSISTED TOTA LAPAROSCOPIC  HYSTERECTOMY WITH BILATERAL SALPINGO OOPHORECTOMY;  Surgeon: Everitt Amber, MD;  Location: [facility];  Service: Gynecology;  Laterality: Bilateral;  . TUBAL LIGATION      Vitals:   03/21/18 1232  Pulse: 98    Subjective Assessment - 03/21/18 1232     Subjective  The patient reports "they lowered my pills"  when asked about HR from last session >2 weeks ago.  PT inquired if her doctor gave her an order for the foot brace/ AfO and she says "I don't need it."  She notes she feels good to d/c from PT because she doesn't need it any more.   PT asked if she was happy with her mobility and she says, "I can get up without help and go riding in the truck".      Patient Stated Goals  Has not been able to get in/out of truck since beginning chemotherapy (months ago).     Currently in Pain?  Yes    Pain Score  -- "a little bit, I took a pain pill"    Pain Location  Back    Pain Orientation  Lower    Pain Descriptors / Indicators  Aching    Pain Onset  More than a month ago    Pain Frequency  Constant            [facility] Adult PT Treatment/Exercise - 03/21/18 1247      Transfers   Transfers  Sit to Stand    Sit to Stand  6: Modified independent (Device/Increase time)      Ambulation/Gait   Ambulation/Gait  Yes    Ambulation/Gait  Assistance  5: Supervision;4: Min guard    Ambulation/Gait Assistance Details  PT provided CGA mainly due to continued right foot drop during gait.  She ambulates 345 feet nonstop without catching foot.  PT and patient discussed AFO use.  She is not interested in getting an AFO at this time.  *PT discussed fall risk.  Also walked with R toe off brace donned.    Ambulation Distance (Feet)  345 Feet    Assistive device  None;Rolling walker    Gait Pattern  Step-through pattern;Poor foot clearance - right    Ambulation Surface  Level;Indoor    Stairs  Yes    Stairs Assistance  6: Modified independent (Device/Increase time);5: Supervision    Stairs Assistance Details (indicate cue type and reason)  HR after steps=134  PT revcommending superbvision (which she has from her boyfriend on steps)    Stair Management Technique  One rail Left    Number of Stairs  8    Gait Comments  HR after 345 feet of gait was 144 bpm.  O2=98%,   With resting x 5 minutes, HR=107.    After stairs, HR=134, rest x 5 minutes=110      Self-Care   Self-Care  Other Self-Care Comments    Other Self-Care Comments   PT discussed safety with walking recommending continued use of RW due to fall risk.  Patient verbalizes understanding.      Therapeutic Activites    Therapeutic Activities  Other Therapeutic Activities    Other Therapeutic Activities  Sit<>stand x 5 times without UE use meeting LTG.               PT Short Term Goals - 02/18/18 3790      PT SHORT TERM GOAL #1   Title  The patient will perform HEP with assist from caregiver.      Time  4    Period  Weeks    Status  Achieved      PT SHORT TERM GOAL #2   Title  The patient will improve Berg from 11/56 to > or equal to 16/56 to demonstrate improved steady state standing for ADLs.    Baseline  27/56 on 02/18/18    Time  4    Period  Weeks    Status  Achieved      PT SHORT TERM GOAL #3   Title  The patient will move sit<>stand from mat height surface with SBA.    Baseline  mod A from mat sit<>stand to RW at eval.  On 02/11/2018:  close supervision for sit<>stand x 10 reps.    Time  4    Period  Weeks    Status  Achieved      PT SHORT TERM GOAL #4   Title  The patient will improve gait speed from 1.95 ft/sec to > or equal to 2.3 ft/sec to demonstrate improving functional mobility.    Baseline  2.58 ft/sec     Time  4    Period  Weeks    Status  Achieved      PT SHORT TERM GOAL #5   Title  The patient will ambulate x 400 feet nonstop with least restrictive assistive device mod indep for improved household mobility.    Baseline  345 ft nonstop with supervision today using RW.    Time  4    Period  Weeks    Status  Partially Met        PT Long  Term Goals - 03/21/18 1239      PT LONG TERM GOAL #1   Title  The patient will report eating meals at kitchen table 2 out of 3 meals/day to demo increasing walking in home for long term mobility.    Baseline  She  reports her significant other doesn't eat at the table either and she does not plan to do this.    Time  8    Period  Weeks    Status  Not Met      PT LONG TERM GOAL #2   Title  The patient will improve Berg from 11/56 to > or equal to 20/56 to demonstrate improving static balance for ADLs.    Baseline  *see short term goals    Time  8    Period  Weeks    Status  Achieved      PT LONG TERM GOAL #3   Title  The patient will move sit<>stand from chair with arm rests and mat height surfaces mod indep to assistive device 5/5 times for improved mobility.    Time  8    Period  Weeks    Status  Achieved      PT LONG TERM GOAL #4   Title  The patient will negotiate 4 steps mod indep with step to pattern descending for household entry/exit.    Baseline  Patient needs supervision for safety.    Time  8    Period  Weeks    Status  Partially Met      PT LONG TERM GOAL #5   Title  Pt will ambulate 583f with LRAD and supervision outdoors over level and unlevel paved surfaces, ramps, and curbs to incr functional mobility and safety while ambulating in the community.    Baseline  The patient is mod indep with rolling walker x 345 feet.  Without device, she continues to need CGA.      Time  8    Period  Weeks    Status  Partially Met            Plan - 03/21/18 1310    Clinical Impression Statement  The patient met 2 LTGs, partially met 2 LTGs.  She has improved functional mobility.  PT emphasized home safety as she is now moving faster with mobility and notes no device use in the house.  PT reviewed concern for right foot drag and recommends continued use of RW.     PT Treatment/Interventions  ADLs/Self Care Home Management;Gait training;Stair training;Functional mobility training;Therapeutic activities;Therapeutic exercise;Patient/family education;Balance training;Neuromuscular re-education;Orthotic Fit/Training    PT Next Visit Plan  Discharge from PT today.  PT to send today's note to  cardiologist due to continued elevation of HR with activity.    Consulted and Agree with Plan of Care  Patient       Patient will benefit from skilled therapeutic intervention in order to improve the following deficits and impairments:  Abnormal gait, Decreased mobility, Impaired tone, Decreased activity tolerance, Decreased endurance, Decreased strength, Decreased range of motion, Decreased balance, Difficulty walking, Impaired flexibility  Visit Diagnosis: Muscle weakness (generalized)  Stiffness of right hand, not elsewhere classified  Hemiplegia and hemiparesis following cerebral infarction affecting right non-dominant side (HCC)  Other abnormalities of gait and mobility    PHYSICAL THERAPY DISCHARGE SUMMARY  Visits from Start of Care: 11  Current functional level related to goals / functional outcomes: See above   Remaining deficits: R hemimotor impairments with continued risk  for falls. Patient not able to move sit<>stand mod indep and walk mod indep in Water engineer / Equipment: Home program, home safety.  Plan: Patient agrees to discharge.  Patient goals were partially met. Patient is being discharged due to meeting the stated rehab goals.  ?????        Patient requests D/C noting she is moving significantly better and is happy with her mobility.  Thank you for the referral of this patient. Rudell Cobb, MPT   [place], [place] 03/21/2018, 3:00 PM  [place] [address] [place], [place], [address] Phone: [phone]   Fax:  [phone]  Name: Brenda Cunningham MRN: [id number] Date of Birth: 1951/09/10

## 2018-03-24 ENCOUNTER — Encounter: Payer: Medicare Other | Admitting: Occupational Therapy

## 2018-03-24 ENCOUNTER — Ambulatory Visit: Payer: Medicare Other | Admitting: Rehabilitative and Restorative Service Providers"

## 2018-03-29 ENCOUNTER — Encounter: Payer: Medicare Other | Admitting: Occupational Therapy

## 2018-03-31 ENCOUNTER — Ambulatory Visit
Admission: RE | Admit: 2018-03-31 | Discharge: 2018-03-31 | Disposition: A | Discharge status: Home / Self Care | Payer: Medicare Other | Source: Ambulatory Visit | Attending: Nurse Practitioner | Admitting: Nurse Practitioner

## 2018-03-31 DIAGNOSIS — Z1231 Encounter for screening mammogram for malignant neoplasm of breast: Secondary | ICD-10-CM

## 2018-03-31 HISTORY — DX: Personal history of antineoplastic chemotherapy: Z92.21

## 2018-03-31 HISTORY — DX: Personal history of irradiation: Z92.3

## 2018-04-01 ENCOUNTER — Encounter: Payer: Self-pay | Admitting: Neurology

## 2018-04-01 ENCOUNTER — Ambulatory Visit (INDEPENDENT_AMBULATORY_CARE_PROVIDER_SITE_OTHER): Payer: Medicare Other | Admitting: Neurology

## 2018-04-01 ENCOUNTER — Encounter: Payer: Medicare Other | Admitting: Occupational Therapy

## 2018-04-01 VITALS — BP 110/66 | HR 89 | Ht 69.0 in | Wt 223.0 lb

## 2018-04-01 DIAGNOSIS — Z8673 Personal history of transient ischemic attack (TIA), and cerebral infarction without residual deficits: Secondary | ICD-10-CM

## 2018-04-01 DIAGNOSIS — I69351 Hemiplegia and hemiparesis following cerebral infarction affecting right dominant side: Secondary | ICD-10-CM

## 2018-04-01 NOTE — Progress Notes (Signed)
NEUROLOGY CONSULTATION NOTE  Brenda Cunningham MRN: [id number] DOB: Feb 04, 1951  Referring provider: Dr. Helane Rima Primary care provider: Dr. Helane Rima  Reason for consult:  stroke  Dear Dr Lavone Neri:  Thank you for your kind referral of Brenda Cunningham for consultation of the above symptoms. Although her history is well known to you, please allow me to reiterate it for the purpose of our medical record. The patient was accompanied to the clinic by her significant other, Brenda Cunningham, who also provides collateral information. Records and images were personally reviewed where available.  HISTORY OF PRESENT ILLNESS: This is a pleasant 67 year old left-handed woman with a history of hypertension, hyperlipidemia, breast and ovarian cancer, atrial fibrillation, and stroke in 2016, presenting to establish post-stroke care. She was admitted in August 2016 for inability to speak, right facial droop, and right-sided weakness. She was also found to have a left gaze preference. She was outside the TPA window and was also anticoagulated on Xarelto for atrial fibrillation. Brain MRI demonstrated an acute infarct involving the left middle cerebral artery territory, mostly involving the insula and frontal lobe with a small amount of involvement of the temporal and parietal lobe. Her CTA showed an embolus occluding the left middle cerebral artery M1 segment, she subsequently underwent a mechanical embolectomy with restoration of normal flow. She has residual right hemiparesis, mostly affecting distal muscles, and mild expressive aphasia. She was brought to [facility] ER in February 2017 and March 2017 for left hand/head shaking, left foot jerking with decreased responsiveness lasting about 2-3 minutes. No post-event confusion. Repeat MRI brain showed remote large left MCA infarct with global atrophy, no acute changes. She attributed the first event to dehydration. She states today that it was very hot and she recalls taking her  coat off and starting shaking saying "I'm too hot." Her significant other denies any further similar spells since then. 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 She reports that she is doing fine. She denies any focal numbness/tingling. She has low back pain. She denies any headaches, dizziness, diplopia, dysphagia, neck pain, bladder dysfunction. She has occasional constipation. She has noticed swelling in her right leg.   PAST MEDICAL HISTORY: Past Medical History:  Diagnosis Date  . A-fib ([place])   . Anxiety   . Arthritis   . Breast cancer ([place]) 2008   s/p lumpectomy, chemo and radiation therapy  . Complication of anesthesia    DIFFICULTY WAKING UP FROM ANESTHESIA  . Hyperlipidemia   . Hypertension   . Personal history of chemotherapy   . Personal history of radiation therapy   . Stroke ([place]) 07/29/2015  . Wears glasses     PAST SURGICAL HISTORY: Past Surgical History:  Procedure Laterality Date  . APPENDECTOMY    . BREAST LUMPECTOMY  2008   left  . COLONOSCOPY  2004  . DEBULKING N/A 03/24/2016   Procedure: RADICAL TUMOR DEBULKING;  Surgeon: Everitt Amber, MD;  Location: [facility];  Service: Gynecology;  Laterality: N/A;  . OMENTECTOMY N/A 03/24/2016   Procedure: OMENTECTOMY;  Surgeon: Everitt Amber, MD;  [facility]: [facility];  Service: Gynecology;  Laterality: N/A;  . ROBOTIC ASSISTED TOTAL HYSTERECTOMY WITH BILATERAL SALPINGO OOPHERECTOMY Bilateral 03/24/2016   Procedure: XI ROBOTIC ASSISTED TOTA LAPAROSCOPIC  HYSTERECTOMY WITH BILATERAL SALPINGO OOPHORECTOMY;  Surgeon: Everitt Amber, MD;  Location: [facility];  Service: Gynecology;  Laterality: Bilateral;  . TUBAL LIGATION      MEDICATIONS: Current Outpatient Medications on File Prior to Visit  Medication  Sig Dispense Refill  . apixaban (ELIQUIS) 5 MG TABS tablet Take 1 tablet (5 mg total) by mouth 2 (two) times daily. 60 tablet 5  . aspirin EC 81 MG tablet Take 81 mg by mouth daily.    Marland Kitchen atorvastatin (LIPITOR) 40  MG tablet Take 40 mg by mouth at bedtime.    . gabapentin (NEURONTIN) 300 MG capsule Take 1 capsule (300 mg total) 3 (three) times daily by mouth. 90 capsule 3  . gabapentin (NEURONTIN) 300 MG capsule TAKE ONE CAPSULE BY MOUTH TWICE DAILY 60 capsule 0  . lactulose (CHRONULAC) 10 GM/15ML solution Take 15 mLs (10 g total) by mouth 3 (three) times daily as needed for mild constipation. 240 mL 0  . lidocaine-prilocaine (EMLA) cream Apply to Porta-Cath 1-2 hours prior to access as directed. 30 g 1  . loratadine (CLARITIN) 10 MG tablet Take 10 mg by mouth daily as needed (Recommended for chemo pain). Reported on 03/16/2016    . metoprolol succinate (TOPROL-XL) 25 MG 24 hr tablet   5  . mirtazapine (REMERON) 15 MG tablet Take 15 mg by mouth at bedtime.    Alanda Slim Tosylate (ZEJULA) 100 MG CAPS Take 300 mg by mouth daily. 90 capsule 11  . omega-3 acid ethyl esters (LOVAZA) 1 g capsule Take 1 g by mouth daily.    Marland Kitchen oxyCODONE (OXY IR/ROXICODONE) 5 MG immediate release tablet Take 1 tablet (5 mg total) by mouth every 4 (four) hours as needed for severe pain. 90 tablet 0  . PARoxetine (PAXIL) 20 MG tablet TAKE 1 TABLET BY MOUTH DAILY (Patient taking differently: TAKE 20 MG BY MOUTH DAILY) 90 tablet 0  . verapamil (CALAN-SR) 240 MG CR tablet Take 1 tablet (240 mg total) by mouth daily. 30 tablet 0  . [DISCONTINUED] prochlorperazine (COMPAZINE) 10 MG tablet Take 1 tablet (10 mg total) by mouth every 6 (six) hours as needed (Nausea or vomiting). 30 tablet 1   No current facility-administered medications on file prior to visit.     ALLERGIES: Allergies  Allergen Reactions  . Codeine Hives and Itching  . Lactulose Other (See Comments)    Pt reports taking once, was difficult to arouse while on the toilet.  Pt reports taking once, was difficult to arouse while on the toilet.   . Latex     Skin breaks out per patient.    FAMILY HISTORY: Family History  Problem Relation Age of Onset  . Cirrhosis Father         +heavy EtOH  . Diabetes Maternal Uncle   . Other Daughter        hx benign breast bx  . Breast cancer Neg Hx     SOCIAL HISTORY: Social History   Socioeconomic History  . Marital status: Single    Spouse name: Not on file  . Number of children: Not on file  . Years of education: Not on file  . Highest education level: Not on file  Occupational History  . Not on file  Social Needs  . Financial resource strain: Not on file  . Food insecurity:    Worry: Not on file    Inability: Not on file  . Transportation needs:    Medical: Not on file    Non-medical: Not on file  Tobacco Use  . Smoking status: Never Smoker  . Smokeless tobacco: Never Used  Substance and Sexual Activity  . Alcohol use: Yes    Comment: occ beer or glass of wine  .  Drug use: No  . Sexual activity: Not on file  Lifestyle  . Physical activity:    Days per week: Not on file    Minutes per session: Not on file  . Stress: Not on file  Relationships  . Social connections:    Talks on phone: Not on file    Gets together: Not on file    Attends religious service: Not on file    Active member of club or organization: Not on file    Attends meetings of clubs or organizations: Not on file    Relationship status: Not on file  . Intimate partner violence:    Fear of current or ex partner: Not on file    Emotionally abused: Not on file    Physically abused: Not on file    Forced sexual activity: Not on file  Other Topics Concern  . Not on file  Social History Narrative   Lives with her female friend, Mina Marble, walks for exercise.  Originally from [place].    REVIEW OF SYSTEMS: Constitutional: No fevers, chills, or sweats, no generalized fatigue, change in appetite Eyes: No visual changes, double vision, eye pain Ear, nose and throat: No hearing loss, ear pain, nasal congestion, sore throat Cardiovascular: No chest pain, palpitations Respiratory:  No shortness of breath at rest or with exertion,  wheezes GastrointestinaI: No nausea, vomiting, diarrhea, abdominal pain, fecal incontinence Genitourinary:  No dysuria, urinary retention or frequency Musculoskeletal:  No neck pain,+ back pain Integumentary: No rash, pruritus, skin lesions Neurological: as above Psychiatric: No depression, insomnia, anxiety Endocrine: No palpitations, fatigue, diaphoresis, mood swings, change in appetite, change in weight, increased thirst Hematologic/Lymphatic:  No anemia, purpura, petechiae. Allergic/Immunologic: no itchy/runny eyes, nasal congestion, recent allergic reactions, rashes  PHYSICAL EXAM: Vitals:   04/01/18 0845  BP: 110/66  Pulse: 89  SpO2: 97%   General: No acute distress Head:  Normocephalic/atraumatic Eyes: Fundoscopic exam shows bilateral sharp discs, no vessel changes, exudates, or hemorrhages Neck: supple, no paraspinal tenderness, full range of motion Back: No paraspinal tenderness Heart: regular rate and rhythm Lungs: Clear to auscultation bilaterally. Vascular: No carotid bruits. Skin/Extremities: No rash, no edema Neurological Exam: Mental status: alert and oriented to person, place, and time (correct month, date given was 4/24, it is 4/26), mild dysarthria with difficulty pronouncing certain letters (pronounces "daisies" as "daijies, "red" as "wed"). Mild expressive aphasia, can name, repeat, read, 1/5 WORLD backward but can spell forward without difficulty. Fund of knowledge is appropriate.  Recent and remote memory are impaired, 1/3 delayed recall. Attention and concentration are normal.  Cranial nerves: CN I: not tested CN II: pupils equal, round and reactive to light, visual fields intact, fundi unremarkable. CN III, IV, VI:  full range of motion, no nystagmus, no ptosis CN V: facial sensation intact CN VII: upper and lower face symmetric CN VIII: hearing intact to finger rub CN IX, X: gag intact, uvula midline CN XI: sternocleidomastoid and trapezius muscles  intact CN XII: tongue midline Bulk & Tone: normal, no fasciculations. Motor: 5/5 on left UE and LE, 5/5 proximal right UE and LE, 3/5 right wrist extension, 0/5 right finger extension and flexion Sensation: intact to light touch, cold, pin, vibration and joint position sense.  No extinction to double simultaneous stimulation.  Romberg test negative Deep Tendon Reflexes: unable to elicit throughout, no ankle clonus Plantar responses: downgoing bilaterally Cerebellar: no incoordination on finger to nose testing Gait: circumduction gait with right leg Tremor: none  IMPRESSION: This  is a pleasant 67 year old left-handed woman with a history of hypertension, hyperlipidemia, breast and ovarian cancer, atrial fibrillation, and left MCA stroke in 2016 secondary to atrial fibrillation, presenting to establish post-stroke care. Exam shows mild expressive aphasia and right hemiparesis. She and her significant other deny any further episodes of left-sided shaking with decreased responsiveness since 2017. She mostly had questions regarding what to do with her right hand when cooking, I discussed prognosis this far out after a stroke, she tries to do home OT exercises. We discussed the importance of medication compliance, continue Eliquis, control of vascular risk factors, BP today 110/66. She knows to go to the ER for any sudden change in symptoms. Follow-up in 1 year, they know to call for any changes.  Thank you for allowing me to participate in the care of this patient. Please do not hesitate to call for any questions or concerns.   Ellouise Newer, M.D.  CC: Dr. Lavone Neri

## 2018-04-01 NOTE — Patient Instructions (Signed)
1. Continue blood thinner, control of blood pressure, cholesterol, sugar levels to help prevent another stroke 2. For any sudden changes in symptoms, go to ER immediately 3. Follow-up in 1 year, call for any changes

## 2018-04-04 ENCOUNTER — Encounter: Payer: Medicare Other | Admitting: Occupational Therapy

## 2018-04-04 DIAGNOSIS — I69351 Hemiplegia and hemiparesis following cerebral infarction affecting right dominant side: Secondary | ICD-10-CM | POA: Insufficient documentation

## 2018-04-06 ENCOUNTER — Encounter: Payer: Medicare Other | Admitting: *Deleted

## 2018-04-07 ENCOUNTER — Inpatient Hospital Stay: Payer: Medicare Other | Attending: Hematology and Oncology

## 2018-04-07 VITALS — BP 110/70 | HR 73 | Temp 98.9°F | Resp 17

## 2018-04-07 DIAGNOSIS — C561 Malignant neoplasm of right ovary: Secondary | ICD-10-CM

## 2018-04-07 DIAGNOSIS — Z95828 Presence of other vascular implants and grafts: Secondary | ICD-10-CM

## 2018-04-07 DIAGNOSIS — Z452 Encounter for adjustment and management of vascular access device: Secondary | ICD-10-CM | POA: Insufficient documentation

## 2018-04-07 DIAGNOSIS — C563 Malignant neoplasm of bilateral ovaries: Secondary | ICD-10-CM

## 2018-04-07 DIAGNOSIS — C562 Malignant neoplasm of left ovary: Secondary | ICD-10-CM | POA: Insufficient documentation

## 2018-04-07 MED ORDER — SODIUM CHLORIDE 0.9% FLUSH
10.0000 mL | Freq: Once | INTRAVENOUS | Status: AC
  Administered 2018-04-07: 10 mL
  Filled 2018-04-07: qty 10

## 2018-04-07 MED ORDER — HEPARIN SOD (PORK) LOCK FLUSH 100 UNIT/ML IV SOLN
500.0000 [IU] | Freq: Once | INTRAVENOUS | Status: AC
  Administered 2018-04-07: 500 [IU]
  Filled 2018-04-07: qty 5

## 2018-04-07 NOTE — Patient Instructions (Signed)
Implanted Port Home Guide An implanted port is a type of central line that is placed under the skin. Central lines are used to provide IV access when treatment or nutrition needs to be given through a person's veins. Implanted ports are used for long-term IV access. An implanted port may be placed because:  You need IV medicine that would be irritating to the small veins in your hands or arms.  You need long-term IV medicines, such as antibiotics.  You need IV nutrition for a long period.  You need frequent blood draws for lab tests.  You need dialysis.  Implanted ports are usually placed in the chest area, but they can also be placed in the upper arm, the abdomen, or the leg. An implanted port has two main parts:  Reservoir. The reservoir is round and will appear as a small, raised area under your skin. The reservoir is the part where a needle is inserted to give medicines or draw blood.  Catheter. The catheter is a thin, flexible tube that extends from the reservoir. The catheter is placed into a large vein. Medicine that is inserted into the reservoir goes into the catheter and then into the vein.  How will I care for my incision site? Do not get the incision site wet. Bathe or shower as directed by your health care provider. How is my port accessed? Special steps must be taken to access the port:  Before the port is accessed, a numbing cream can be placed on the skin. This helps numb the skin over the port site.  Your health care provider uses a sterile technique to access the port. ? Your health care provider must put on a mask and sterile gloves. ? The skin over your port is cleaned carefully with an antiseptic and allowed to dry. ? The port is gently pinched between sterile gloves, and a needle is inserted into the port.  Only "non-coring" port needles should be used to access the port. Once the port is accessed, a blood return should be checked. This helps ensure that the port  is in the vein and is not clogged.  If your port needs to remain accessed for a constant infusion, a clear (transparent) bandage will be placed over the needle site. The bandage and needle will need to be changed every week, or as directed by your health care provider.  Keep the bandage covering the needle clean and dry. Do not get it wet. Follow your health care provider's instructions on how to take a shower or bath while the port is accessed.  If your port does not need to stay accessed, no bandage is needed over the port.  What is flushing? Flushing helps keep the port from getting clogged. Follow your health care provider's instructions on how and when to flush the port. Ports are usually flushed with saline solution or a medicine called heparin. The need for flushing will depend on how the port is used.  If the port is used for intermittent medicines or blood draws, the port will need to be flushed: ? After medicines have been given. ? After blood has been drawn. ? As part of routine maintenance.  If a constant infusion is running, the port may not need to be flushed.  How long will my port stay implanted? The port can stay in for as long as your health care provider thinks it is needed. When it is time for the port to come out, surgery will be   done to remove it. The procedure is similar to the one performed when the port was put in. When should I seek immediate medical care? When you have an implanted port, you should seek immediate medical care if:  You notice a bad smell coming from the incision site.  You have swelling, redness, or drainage at the incision site.  You have more swelling or pain at the port site or the surrounding area.  You have a fever that is not controlled with medicine.  This information is not intended to replace advice given to you by your health care provider. Make sure you discuss any questions you have with your health care provider. Document  Released: 11/23/2005 Document Revised: 04/30/2016 Document Reviewed: 07/31/2013 Elsevier Interactive Patient Education  2017 [employer]  

## 2018-04-09 ENCOUNTER — Other Ambulatory Visit: Payer: Self-pay | Admitting: Hematology and Oncology

## 2018-04-11 MED FILL — ZEJULA 100 MG CAPS: 100 | 30 days supply | Qty: 90 | Fill #5

## 2018-05-06 MED FILL — ZEJULA 100 MG CAPS: 100 | 30 days supply | Qty: 90 | Fill #6

## 2018-05-07 ENCOUNTER — Other Ambulatory Visit: Payer: Self-pay | Admitting: Hematology and Oncology

## 2018-05-19 ENCOUNTER — Encounter: Payer: Self-pay | Admitting: Hematology and Oncology

## 2018-05-19 ENCOUNTER — Telehealth: Payer: Self-pay | Admitting: Hematology and Oncology

## 2018-05-19 ENCOUNTER — Inpatient Hospital Stay (HOSPITAL_BASED_OUTPATIENT_CLINIC_OR_DEPARTMENT_OTHER): Payer: Medicare Other | Admitting: Hematology and Oncology

## 2018-05-19 ENCOUNTER — Inpatient Hospital Stay: Payer: Medicare Other | Attending: Hematology and Oncology

## 2018-05-19 ENCOUNTER — Inpatient Hospital Stay: Payer: Medicare Other

## 2018-05-19 VITALS — BP 134/80 | HR 83 | Temp 98.2°F | Resp 18 | Ht 69.0 in | Wt 223.2 lb

## 2018-05-19 DIAGNOSIS — Z90722 Acquired absence of ovaries, bilateral: Secondary | ICD-10-CM

## 2018-05-19 DIAGNOSIS — D539 Nutritional anemia, unspecified: Secondary | ICD-10-CM | POA: Insufficient documentation

## 2018-05-19 DIAGNOSIS — K5900 Constipation, unspecified: Secondary | ICD-10-CM | POA: Diagnosis not present

## 2018-05-19 DIAGNOSIS — C562 Malignant neoplasm of left ovary: Secondary | ICD-10-CM

## 2018-05-19 DIAGNOSIS — I517 Cardiomegaly: Secondary | ICD-10-CM | POA: Diagnosis not present

## 2018-05-19 DIAGNOSIS — Z79899 Other long term (current) drug therapy: Secondary | ICD-10-CM | POA: Insufficient documentation

## 2018-05-19 DIAGNOSIS — C563 Malignant neoplasm of bilateral ovaries: Secondary | ICD-10-CM

## 2018-05-19 DIAGNOSIS — Z90710 Acquired absence of both cervix and uterus: Secondary | ICD-10-CM | POA: Diagnosis not present

## 2018-05-19 DIAGNOSIS — Z9221 Personal history of antineoplastic chemotherapy: Secondary | ICD-10-CM | POA: Diagnosis not present

## 2018-05-19 DIAGNOSIS — I4891 Unspecified atrial fibrillation: Secondary | ICD-10-CM

## 2018-05-19 DIAGNOSIS — Z95828 Presence of other vascular implants and grafts: Secondary | ICD-10-CM

## 2018-05-19 DIAGNOSIS — D649 Anemia, unspecified: Secondary | ICD-10-CM | POA: Insufficient documentation

## 2018-05-19 DIAGNOSIS — K5909 Other constipation: Secondary | ICD-10-CM

## 2018-05-19 DIAGNOSIS — I482 Chronic atrial fibrillation, unspecified: Secondary | ICD-10-CM

## 2018-05-19 DIAGNOSIS — I700 Atherosclerosis of aorta: Secondary | ICD-10-CM | POA: Diagnosis not present

## 2018-05-19 DIAGNOSIS — D638 Anemia in other chronic diseases classified elsewhere: Secondary | ICD-10-CM

## 2018-05-19 DIAGNOSIS — C561 Malignant neoplasm of right ovary: Secondary | ICD-10-CM

## 2018-05-19 LAB — CBC WITH DIFFERENTIAL/PLATELET
Basophils Absolute: 0 10*3/uL (ref 0.0–0.1)
Basophils Relative: 0 %
Eosinophils Absolute: 0.1 10*3/uL (ref 0.0–0.5)
Eosinophils Relative: 1 %
HCT: 31 % — ABNORMAL LOW (ref 34.8–46.6)
Hemoglobin: 10.6 g/dL — ABNORMAL LOW (ref 11.6–15.9)
Lymphocytes Relative: 31 %
Lymphs Abs: 1.9 10*3/uL (ref 0.9–3.3)
MCH: 41.4 pg — ABNORMAL HIGH (ref 25.1–34.0)
MCHC: 34.2 g/dL (ref 31.5–36.0)
MCV: 121.1 fL — ABNORMAL HIGH (ref 79.5–101.0)
Monocytes Absolute: 0.7 10*3/uL (ref 0.1–0.9)
Monocytes Relative: 12 %
Neutro Abs: 3.5 10*3/uL (ref 1.5–6.5)
Neutrophils Relative %: 56 %
Platelets: 181 10*3/uL (ref 145–400)
RBC: 2.56 MIL/uL — ABNORMAL LOW (ref 3.70–5.45)
RDW: 16 % — ABNORMAL HIGH (ref 11.2–14.5)
WBC: 6.3 10*3/uL (ref 3.9–10.3)
nRBC: 1 /100 WBC — ABNORMAL HIGH

## 2018-05-19 LAB — COMPREHENSIVE METABOLIC PANEL
ALT: 19 U/L (ref 0–55)
AST: 22 U/L (ref 5–34)
Albumin: 3.9 g/dL (ref 3.5–5.0)
Alkaline Phosphatase: 180 U/L — ABNORMAL HIGH (ref 40–150)
Anion gap: 10 (ref 3–11)
BUN: 13 mg/dL (ref 7–26)
CO2: 26 mmol/L (ref 22–29)
Calcium: 9.6 mg/dL (ref 8.4–10.4)
Chloride: 107 mmol/L (ref 98–109)
Creatinine, Ser: 0.97 mg/dL (ref 0.60–1.10)
GFR calc Af Amer: >60 mL/min (ref >60)
GFR calc non Af Amer: 59 mL/min — ABNORMAL LOW (ref >60)
Glucose, Bld: 100 mg/dL (ref 70–140)
Potassium: 3.8 mmol/L (ref 3.5–5.1)
Sodium: 143 mmol/L (ref 136–145)
Total Bilirubin: 0.6 mg/dL (ref 0.2–1.2)
Total Protein: 7.5 g/dL (ref 6.4–8.3)

## 2018-05-19 MED ORDER — SODIUM CHLORIDE 0.9% FLUSH
10.0000 mL | Freq: Once | INTRAVENOUS | Status: AC
  Administered 2018-05-19: 10 mL
  Filled 2018-05-19: qty 10

## 2018-05-19 MED ORDER — MAGNESIUM CITRATE PO SOLN: 1.0000 | Freq: Once | ORAL | 1 refills | Status: AC

## 2018-05-19 MED ORDER — HEPARIN SOD (PORK) LOCK FLUSH 100 UNIT/ML IV SOLN
500.0000 [IU] | Freq: Once | INTRAVENOUS | Status: AC
  Administered 2018-05-19: 500 [IU]
  Filled 2018-05-19: qty 5

## 2018-05-19 NOTE — Assessment & Plan Note (Signed)
She has chronic atrial fibrillation, rate control She is on chronic anticoagulation therapy

## 2018-05-19 NOTE — Assessment & Plan Note (Signed)
She has significant exacerbation of recent constipation We have extensive discussion about chronic laxative therapy

## 2018-05-19 NOTE — Progress Notes (Signed)
[facility] OFFICE PROGRESS NOTE  Patient Care Team: Helane Rima, MD as PCP - General (Family Medicine) Encarnacion Slates, MD as Referring Physician (Neurology)  ASSESSMENT & PLAN:  Ovarian cancer ([place]) Overall, she tolerated niraparib well I am concerned about recent new onset of constipation It could be related to side effects of medication but it is difficult to assess CA-25 is still pending If tumor marker level is stable, we can wait before repeat CT imaging in 3 months However, if tumor marker is markedly elevated or if her bowel symptoms does not resolve, we will repeat imaging study sooner She agree with the plan of care  Anemia, chronic disease She has chronic anemia I will check iron studies for further work-up  Other constipation She has significant exacerbation of recent constipation We have extensive discussion about chronic laxative therapy  Chronic atrial fibrillation ([place]) She has chronic atrial fibrillation, rate control She is on chronic anticoagulation therapy   Orders Placed This Encounter  Procedures  . CT ABDOMEN PELVIS W CONTRAST    Standing Status:   Future    Standing Expiration Date:   06/23/2019    Order Specific Question:   If indicated for the ordered procedure, I authorize the administration of contrast media per Radiology protocol    Answer:   Yes    Order Specific Question:   Preferred imaging location?    Answer:   [facility]    Order Specific Question:   Radiology Contrast Protocol - do NOT remove file path    Answer:   [email or web address]  . Ferritin    Standing Status:   Future    Standing Expiration Date:   06/23/2019  . Iron and TIBC    Standing Status:   Future    Standing Expiration Date:   06/23/2019  . Vitamin B12    Standing Status:   Future    Standing Expiration Date:   06/23/2019  . Sedimentation rate    Standing Status:   Future    Standing Expiration Date:   06/23/2019     INTERVAL HISTORY: Please see below for problem oriented charting. She returns with family members for further follow-up She have new onset of acute on chronic constipation for the last 3 days She has started to use laxative She is compliant taking her chemotherapy as directed She denies exacerbation of chronic pain The patient denies any recent signs or symptoms of bleeding such as spontaneous epistaxis, hematuria or hematochezia. She denies recent infection She complained of mild bloating but no nausea Her appetite is fair and she has not lost any weight  SUMMARY OF ONCOLOGIC HISTORY: Oncology History   Negative genetic testing     Ovarian cancer ([place])   11/28/2015 Imaging    Ct scan of abdomen: Peritoneal carcinomatosis and pelvic/inguinal adenopathy. Gynecologic primary is favored.      12/17/2015 Pathology Results    Lymph node, needle/core biopsy, L inguinal LAN - METASTATIC ADENOCARCINOMA. Microscopic Comment Immunohistochemistry will be performed and reported as an addendum. (JDP:ecj 12/18/2015) ADDENDUM: Immunohistochemistry shows strong positivity with cytokeratin 7, estrogen receptor (ER), progesterone receptor (PR), p53 and WT1. Negative markers are cytokeratin 20 , CDX- 2 and gross cystic disease fluid protein. 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 (JDP:kh 12-19-15      12/17/2015 Procedure    Technically successful ultrasound guided core left inguinal adenopathy biopsy      12/24/2015  Tumor Marker    Patient's tumor was tested for the following markers: CA-125 Results of the tumor marker test revealed 608.2      12/27/2015 - 02/28/2016 Chemotherapy    She received neoadjuvant chemo x 3 cycles      01/01/2016 Tumor Marker    Patient's tumor was tested for the following markers: CA-125 Results of the tumor marker test revealed 741.6      01/29/2016 Procedure     Successful placement of a right internal jugular approach power injectable Port-A-Cath. The catheter is ready for immediate use      01/29/2016 Imaging    US abdomen 1. Hyperechoic 2.5 x 1.0 x 1.6 cm lesion in the region the caudate lobe of the liver. Similar finding noted on prior CT of 11/28/2015. This could represent hemangioma. This could represent a malignancy including metastatic disease. 2. Exam otherwise unremarkable. No gallstones. No biliary distention.      02/03/2016 Tumor Marker    Patient's tumor was tested for the following markers: CA-125 Results of the tumor marker test revealed 137.1      02/20/2016 Imaging    MRI brain No acute infarct.  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  No intracranial mass.  Moderate small vessel disease changes.  Global atrophy without hydrocephalus.  Expanded partially empty sella without secondary findings of pseudotumor cerebri.  Minimal mucosal thickening ethmoid sinus air cells. Small air-fluid levels maxillary sinuses bilaterally may indicate changes of acute sinusitis.       02/28/2016 Tumor Marker    Patient's tumor was tested for the following markers: CA-125 Results of the tumor marker test revealed 33.6      03/02/2016 Imaging    CT chest, abdomen and pelvis 1. Today's study demonstrates a positive response to therapy with resolution of the previously noted malignant ascites, significant regression of previously noted peritoneal implants, and regression of previously noted lymphadenopathy in the abdomen and pelvis. 2. No definite evidence to suggest metastatic disease to the thorax. 3. Stable 1.0 x 1.7 cm intermediate attenuation lesion associated with the posterior aspect of segment 1 of the liver, favored to represent a mildly proteinaceous hepatic cyst. The possibility of a peritoneal implant in this region is not entirely excluded, but is not favored on today's examination. 4. Extensive  post infectious scarring inguinal upper right lung, likely sequela of prior necrotizing pneumonia. 5. Multiple tiny pulmonary nodules scattered throughout the lungs bilaterally all measuring 4 mm or less. These are nonspecific, but favored to be benign. Given the patient's history of primary gynecologic malignancy, attention on followup studies is recommended to ensure the stability of these findings. 6. Additional incidental findings, as above      03/14/2016 Imaging    CT angiogram 1. No pulmonary emboli. 2. Chronic changes to the right upper lobe. 3. Stable lesion in the caudate lobe of the liver. 4. Stable soft tissue prominence to the right of the trachea as described above. 5. Stable small nodule in the right lung.       03/24/2016 Pathology Results    1. Omentum, resection for tumor - FOCAL ADENOCARCINOMA ASSOCIATED WITH EXTENSIVE FIBROSIS, INFLAMMATION AND HEMOSIDERIN DEPOSITION. 2. Uterus +/- tubes/ovaries, neoplastic, cervix - CERVIX: SLIGHT CERVICITIS AND SQUAMOUS METAPLASIA. - ENDOMETRIUM: ATROPHIC, NO HYPERPLASIA OR MALIGNANCY. - MYOMETRIUM: LEIOMYOMATA WITH DEGENERATIVE CHANGES. NO MALIGNANCY. - UTERINE SEROSA: FOCAL ADENOCARCINOMA ASSOCIATED WITH ADHESIONS. - RIGHT OVARY: BENIGN CALCIFIED NODULE AND BENIGN SEROUS CYST. NO MALIGNANCY IDENTIFIED. - RIGHT FALLOPIAN TUBE: UNREMARKABLE. NO MALIGNANCY  IDENTIFIED. - LEFT OVARY: FOCAL ADENOCARCINOMA ASSOCIATED WITH EXTENSIVE FIBROSIS. - LEFT FALLOPIAN TUBE: HYDROSALPINX. NO MALIGNANCY IDENTIFIED. 3. Lymph node, biopsy, right external iliac - METASTATIC ADENOCARCINOMA, 4. Soft tissue, biopsy, left para colic gutter - FOCAL ADENOCARCINOMA ASSOCIATED WITH FIBROSIS. Microscopic Comment 2. OVARY Specimen(s): Uterus with bilateral ovaries, omentum, right external iliac lymph node and left paracolic gutter. Procedure: (including lymph node sampling) Hysterectomy with bilateral salpingo-oophorectomy, omentectomy, lymph node biopsy and  paracolic gutter biopsy. Primary tumor site (including laterality): Left ovary. Ovarian surface involvement: Yes Ovarian capsule intact without fragmentation: N/A Maximum tumor size (cm): 2 cm, 2 cm Histologic type: Serous carcinoma. Grade: 3 Peritoneal implants: (specify invasive or non-invasive): Left paracolic gutter positive for carcinoma Pelvic extension (list additional structures on separate lines and if involved): Omentum, right paracolic gutter and uterine serosa. Lymph nodes: number examined 1 ; number positive 1 TNM code: ypT3a, ypN1b, ypMX FIGO Stage (based on pathologic findings, needs clinical correlation): IIIA2 Comments: The left ovary has a 2 cm nodule, which is composed primarily of fibrous tissue with small foci of residual adenocarcinoma. There is also microscopic involvement of the uterine serosa, the left paracolic gutter biopsy and the omentum. The left paracolic gutter and omental involvement is microscopic and both are associated with extensive fibrosis with focal hemosiderin deposition. The right external iliac node is completely replaced by metastatic adenocarcinoma with serous features. (JDP:ecj 03/30/2016)      03/24/2016 Surgery    Operation: Robotic-assisted laparoscopic total hysterectomy with bilateral salpingoophorectomy, omentectomy, radical tumor debulking  Surgeon: Donaciano Eva  Operative Findings:  : small nodules in omentum, no ascites. Omental nodule (2cm) adherent to lower anterior abdominal wall. 2cm left colonic gutter cystic nodule. 6cm right external iliac lymph node. Grossly normal ovaries and tubes. Fibroid uterus. No gross residual disease at completion of surgery representing R0 optimal/complete resection.       04/17/2016 Imaging    CT abdomen and pelvis 1. There is no evidence of acute inflammatory process within abdomen or pelvis. 2. Stable low-density lesion within caudate lobe of the liver. No new hepatic lesions are  noted. 3. Again noted lobulated renal contour and multifocal renal cortical scarring. No hydronephrosis or hydroureter. 4. No significant mesenteric adenopathy. No retroperitoneal adenopathy. Stable minimal residual peritoneal thickening within pelvis. No new pelvic implants or pelvic ascites. 5. Status post hysterectomy.  Unremarkable urinary bladder. 6. Moderate stool within colon as described above. No evidence of colitis.       04/24/2016 - 06/19/2016 Chemotherapy    She received 3 more cycles of chemo after surgery      05/14/2016 Tumor Marker    Patient's tumor was tested for the following markers: CA-125 Results of the tumor marker test revealed 22.5      06/04/2016 Tumor Marker    Patient's tumor was tested for the following markers: CA-125 Results of the tumor marker test revealed 13.7      07/07/2016 Genetic Testing    Patient has genetic testing done for breast/ovasrian cancer panel Results revealed patient has no actionable mutation      07/23/2016 Imaging    Ct abdomen and pelvis 1. Heterogeneously enhancing 4.9 x 3.2 x 4.4 cm mass along the right pelvic sidewall may represent locally recurrent disease or malignant lymphadenopathy. 2. No other signs of definite metastatic disease noted elsewhere in the abdomen or pelvis. 3. Stable low-attenuation hepatic lesion in the caudate lobe of the liver, which appears to demonstrates some progressive centripetal filling on delayed images. This  lesion is presumably benign given its stability compared to prior studies, and is favored to represent a small cavernous hemangioma. 4. Cardiomegaly with biatrial dilatation, which is very severe on the right side. 5. Aortic atherosclerosis. 6. Additional incidental findings, as above.      07/23/2016 Tumor Marker    Patient's tumor was tested for the following markers: CA-125 Results of the tumor marker test revealed 12.3      09/30/2016 Imaging    Ct abdomen and pelvis: 1. 4.9 cm  heterogeneously enhancing mass seen along the right pelvic sidewall previously has almost completely resolved. There is some ill-defined soft tissue attenuation/peritoneal thickening in this region today but no discrete measurable lesion is evident. 2. No new or progressive findings on today's exam. 3. Stable hypo attenuating lesion in the caudate lobe of the liver. 4. Subtle aortic atherosclerosis      09/30/2016 Tumor Marker    Patient's tumor was tested for the following markers: CA-125 Results of the tumor marker test revealed 9.6      10/05/2016 Pathology Results    Vagina, biopsy, left cuff - BENIGN FIBROEPITHELIAL (STROMAL) POLYP. - NO DYSPLASIA, ATYPIA OR MALIGNANCY IDENTIFIED.      01/14/2017 Tumor Marker    Patient's tumor was tested for the following markers: CA-125 Results of the tumor marker test revealed 11.9      05/05/2017 Tumor Marker    Patient's tumor was tested for the following markers: CA-125 Results of the tumor marker test revealed 28      06/14/2017 Tumor Marker    Patient's tumor was tested for the following markers: CA-125 Results of the tumor marker test revealed 45.7      06/24/2017 Imaging    Ct abdomen and pelvis: Increased peritoneal metastatic disease in abdomen and pelvis since prior exam. No evidence of ascites. Stable small benign hepatic hemangioma.      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 Does of chemo is reduced starting cycle 2 onwards      09/02/2017 Tumor Marker    Patient's tumor was tested for the following markers: CA-125 Results of the tumor marker test revealed 27.7      09/16/2017 Imaging    CT CHEST IMPRESSION  1. No acute process or evidence of metastatic disease in  the chest. 2. Right upper lung bronchiectasis, consolidation, and architectural distortion are most likely post infectious or inflammatory and not significantly changed. 3. Aortic Atherosclerosis (ICD10-I70.0).  CT ABDOMEN AND PELVIS IMPRESSION  1. Response to therapy of nodal and peritoneal metastasis. 2. No new or progressive disease. 3. Caudate lobe hemangioma, as before. 4. Bilateral renal cortical thinning. Similar minimal right-sided caliectasis and hydroureter. Likely secondary to low-grade obstruction by the dominant right pelvic implant.      09/22/2017 Tumor Marker    Patient's tumor was tested for the following markers: CA-125 Results of the tumor marker test revealed 18      11/03/2017 Tumor Marker    Patient's tumor was tested for the following markers: CA-125 Results of the tumor marker test revealed 13.4      11/25/2017 Imaging    1. Stable to slight interval decrease in size of nodal and peritoneal metastatic disease  within the abdomen. 2. No new or progressive disease.      12/03/2017 -  Chemotherapy    The patient is prescribed Zejula      01/17/2018 Tumor Marker    Patient's tumor was tested for the following markers: CA-125 Results of the tumor marker test revealed 11.9      02/22/2018 Tumor Marker    Patient's tumor was tested for the following markers: CA-125 Results of the tumor marker test revealed 11.7      02/22/2018 Imaging    1. Improved nodular thickening along the vaginal cuff on the left and medial to the right iliac vessels, likely treated implants. No evidence of progressive peritoneal disease. 2. Stable hepatic hemangioma. No evidence of solid visceral organ metastases. 3. Bilateral renal cortical scarring.       REVIEW OF SYSTEMS:   Constitutional: Denies fevers, chills or abnormal weight loss Eyes: Denies blurriness of vision Ears, nose, mouth, throat, and face: Denies mucositis or sore throat Respiratory: Denies cough, dyspnea or  wheezes Cardiovascular: Denies palpitation, chest discomfort or lower extremity swelling Skin: Denies abnormal skin rashes Lymphatics: Denies new lymphadenopathy or easy bruising Neurological:Denies numbness, tingling or new weaknesses Behavioral/Psych: Mood is stable, no new changes  All other systems were reviewed with the patient and are negative.  I have reviewed the past medical history, past surgical history, social history and family history with the patient and they are unchanged from previous note.  ALLERGIES:  is allergic to codeine; lactulose; and latex.  MEDICATIONS:  Current Outpatient Medications  Medication Sig Dispense Refill  . polyethylene glycol (MIRALAX / GLYCOLAX) packet Take 17 g by mouth daily.    Marland Kitchen apixaban (ELIQUIS) 5 MG TABS tablet Take 1 tablet (5 mg total) by mouth 2 (two) times daily. 60 tablet 5  . aspirin EC 81 MG tablet Take 81 mg by mouth daily.    Marland Kitchen atorvastatin (LIPITOR) 40 MG tablet Take 40 mg by mouth at bedtime.    . gabapentin (NEURONTIN) 300 MG capsule Take 1 capsule (300 mg total) 3 (three) times daily by mouth. 90 capsule 3  . gabapentin (NEURONTIN) 300 MG capsule TAKE ONE CAPSULE BY MOUTH TWICE DAILY 60 capsule 0  . lidocaine-prilocaine (EMLA) cream Apply to Porta-Cath 1-2 hours prior to access as directed. 30 g 1  . loratadine (CLARITIN) 10 MG tablet Take 10 mg by mouth daily as needed (Recommended for chemo pain). Reported on 03/16/2016    . magnesium citrate SOLN Take 296 mLs (1 Bottle total) by mouth once for 1 dose. 195 mL 1  . metoprolol succinate (TOPROL-XL) 25 MG 24 hr tablet   5  . mirtazapine (REMERON) 15 MG tablet Take 15 mg by mouth at bedtime.    Alanda Slim Tosylate (ZEJULA) 100 MG CAPS Take 300 mg by mouth daily. 90 capsule 11  . omega-3 acid ethyl esters (LOVAZA) 1 g capsule Take 1 g by mouth daily.    Marland Kitchen oxyCODONE (OXY IR/ROXICODONE) 5 MG immediate release tablet Take 1 tablet (5 mg total) by mouth every 4 (four) hours as needed  for severe pain. 90 tablet 0  . PARoxetine (PAXIL) 20 MG tablet TAKE 1 TABLET BY MOUTH DAILY (Patient taking differently: TAKE 20 MG BY MOUTH DAILY) 90 tablet 0  . verapamil (CALAN-SR) 240 MG CR tablet Take 1 tablet (240 mg total) by mouth daily. 30 tablet 0   No current facility-administered medications for this visit.     PHYSICAL EXAMINATION: ECOG PERFORMANCE STATUS: 2 -  Symptomatic, <50% confined to bed  Vitals:   05/19/18 1118  BP: 134/80  Pulse: 83  Resp: 18  Temp: 98.2 F (36.8 C)  SpO2: 100%   Filed Weights   05/19/18 1118  Weight: 223 lb 3.2 oz (101.2 kg)    GENERAL:alert, no distress and comfortable SKIN: skin color, texture, turgor are normal, no rashes or significant lesions EYES: normal, Conjunctiva are pink and non-injected, sclera clear OROPHARYNX:no exudate, no erythema and lips, buccal mucosa, and tongue normal  NECK: supple, thyroid normal size, non-tender, without nodularity LYMPH:  no palpable lymphadenopathy in the cervical, axillary or inguinal LUNGS: clear to auscultation and percussion with normal breathing effort HEART: regular rate & rhythm and no murmurs and no lower extremity edema ABDOMEN:abdomen soft, non-tender and normal bowel sounds Musculoskeletal:no cyanosis of digits and no clubbing  NEURO: alert & oriented x 3 with dysarthria and chronic hemiparesis from prior stroke  LABORATORY DATA:  I have reviewed the data as listed    Component Value Date/Time   NA 143 05/19/2018 1058   NA 142 12/08/2017 1051   K 3.8 05/19/2018 1058   K 4.4 12/08/2017 1051   CL 107 05/19/2018 1058   CL 105 06/14/2017 1430   CO2 26 05/19/2018 1058   CO2 28 12/08/2017 1051   GLUCOSE 100 05/19/2018 1058   GLUCOSE 97 12/08/2017 1051   BUN 13 05/19/2018 1058   BUN 16.2 12/08/2017 1051   CREATININE 0.97 05/19/2018 1058   CREATININE 0.9 12/08/2017 1051   CALCIUM 9.6 05/19/2018 1058   CALCIUM 9.5 12/08/2017 1051   PROT 7.5 05/19/2018 1058   PROT 6.9 12/08/2017  1051   ALBUMIN 3.9 05/19/2018 1058   ALBUMIN 3.6 12/08/2017 1051   AST 22 05/19/2018 1058   AST 18 12/08/2017 1051   ALT 19 05/19/2018 1058   ALT 20 12/08/2017 1051   ALKPHOS 180 (H) 05/19/2018 1058   ALKPHOS 131 12/08/2017 1051   BILITOT 0.6 05/19/2018 1058   BILITOT 0.70 12/08/2017 1051   GFRNONAA 59 (L) 05/19/2018 1058   GFRAA >60 05/19/2018 1058    No results found for: SPEP, UPEP  Lab Results  Component Value Date   WBC 6.3 05/19/2018   NEUTROABS 3.5 05/19/2018   HGB 10.6 (L) 05/19/2018   HCT 31.0 (L) 05/19/2018   MCV 121.1 (H) 05/19/2018   PLT 181 05/19/2018      Chemistry      Component Value Date/Time   NA 143 05/19/2018 1058   NA 142 12/08/2017 1051   K 3.8 05/19/2018 1058   K 4.4 12/08/2017 1051   CL 107 05/19/2018 1058   CL 105 06/14/2017 1430   CO2 26 05/19/2018 1058   CO2 28 12/08/2017 1051   BUN 13 05/19/2018 1058   BUN 16.2 12/08/2017 1051   CREATININE 0.97 05/19/2018 1058   CREATININE 0.9 12/08/2017 1051      Component Value Date/Time   CALCIUM 9.6 05/19/2018 1058   CALCIUM 9.5 12/08/2017 1051   ALKPHOS 180 (H) 05/19/2018 1058   ALKPHOS 131 12/08/2017 1051   AST 22 05/19/2018 1058   AST 18 12/08/2017 1051   ALT 19 05/19/2018 1058   ALT 20 12/08/2017 1051   BILITOT 0.6 05/19/2018 1058   BILITOT 0.70 12/08/2017 1051      All questions were answered. The patient knows to call the clinic with any problems, questions or concerns. No barriers to learning was detected.  I spent 25 minutes counseling the patient face to face.  The total time spent in the appointment was 30 minutes and more than 50% was on counseling and review of test results  Heath Lark, MD 05/19/2018 1:40 PM

## 2018-05-19 NOTE — Assessment & Plan Note (Signed)
Overall, she tolerated niraparib well I am concerned about recent new onset of constipation It could be related to side effects of medication but it is difficult to assess CA-25 is still pending If tumor marker level is stable, we can wait before repeat CT imaging in 3 months However, if tumor marker is markedly elevated or if her bowel symptoms does not resolve, we will repeat imaging study sooner She agree with the plan of care

## 2018-05-19 NOTE — Assessment & Plan Note (Signed)
She has chronic anemia I will check iron studies for further work-up

## 2018-05-19 NOTE — Telephone Encounter (Signed)
Gave patient avs and calendar of upcoming July through September appointments.  °

## 2018-05-20 ENCOUNTER — Telehealth: Payer: Self-pay

## 2018-05-20 LAB — CA 125: Cancer Antigen (CA) 125: 9.9 U/mL (ref 0.0–38.1)

## 2018-05-20 NOTE — Telephone Encounter (Signed)
-----   Message from Heath Lark, MD sent at 05/20/2018  7:28 AM EDT ----- Regarding: CA-125 Level is good Please inform patient or family ----- Message ----- From: Interface, [facility] [place] Sent: 05/19/2018  11:04 AM To: Heath Lark, MD

## 2018-05-20 NOTE — Telephone Encounter (Signed)
Called and given below message to husband. He verbalized understanding. Brenda Cunningham was sleeping.

## 2018-05-23 ENCOUNTER — Telehealth: Payer: Self-pay | Admitting: *Deleted

## 2018-05-23 NOTE — Telephone Encounter (Signed)
LM to call Dr Gorsuch's nurse 

## 2018-05-23 NOTE — Telephone Encounter (Signed)
-----   Message from Heath Lark, MD sent at 05/23/2018  7:45 AM EDT ----- Regarding: constipation Can you call and ask how she is doing? FYI: she has stroke and some dysarthria. If not possible to ask, call daughter

## 2018-05-24 NOTE — Telephone Encounter (Signed)
Spoke with daughter. States she is doing well and bowels are moving without problems

## 2018-06-02 ENCOUNTER — Telehealth: Payer: Self-pay | Admitting: *Deleted

## 2018-06-02 ENCOUNTER — Telehealth: Payer: Self-pay

## 2018-06-02 MED FILL — ZEJULA 100 MG CAPS: 100 | 30 days supply | Qty: 90 | Fill #7

## 2018-06-02 NOTE — Telephone Encounter (Signed)
I am not aware of her medications causing discoloration Recommend visit with primary doctor for discussion/evaluation if that concerns her

## 2018-06-02 NOTE — Telephone Encounter (Signed)
Daughter Kenney Houseman called and left a message. She is concerned about her mom. Her tongue, gums and roof of her mouth have turned a black color. The only thing new she has had in the last week in a gummy vitamin and x-lax. She is wondering if it some medication interaction.

## 2018-06-02 NOTE — Telephone Encounter (Signed)
Called and given below message. Kenney Houseman, daughter verbalized understanding.

## 2018-06-02 NOTE — Telephone Encounter (Signed)
Spoke with daughter Kenney Houseman, and was informed that she noticed pt's mouth is black - roof of mouth, tongue are black.  Stated she noticed the discoloration today.  However, pt informed her that the black color in mouth has been going on for about a week , and her husband had disputed the claim - he stated just today. Nurse could hear pt's response in the background when asked if pt had problems with eating, chewing, and/or swallowing.  Pt responsed " NO " .  Pt denied bleeding, denied pain, denied swelling .  Stated just black color, and wanted to let Dr. Alvy Bimler know. [name][facility]     Phone     [phone].

## 2018-06-08 ENCOUNTER — Other Ambulatory Visit: Payer: Self-pay | Admitting: Hematology and Oncology

## 2018-06-27 MED FILL — ZEJULA 100 MG CAPS: 100 | 30 days supply | Qty: 90 | Fill #8

## 2018-06-30 ENCOUNTER — Inpatient Hospital Stay: Payer: Medicare Other | Attending: Hematology and Oncology

## 2018-06-30 DIAGNOSIS — Z95828 Presence of other vascular implants and grafts: Secondary | ICD-10-CM

## 2018-06-30 DIAGNOSIS — C562 Malignant neoplasm of left ovary: Secondary | ICD-10-CM | POA: Diagnosis not present

## 2018-06-30 DIAGNOSIS — C563 Malignant neoplasm of bilateral ovaries: Secondary | ICD-10-CM

## 2018-06-30 DIAGNOSIS — C561 Malignant neoplasm of right ovary: Secondary | ICD-10-CM

## 2018-06-30 DIAGNOSIS — Z452 Encounter for adjustment and management of vascular access device: Secondary | ICD-10-CM | POA: Insufficient documentation

## 2018-06-30 MED ORDER — SODIUM CHLORIDE 0.9% FLUSH
10.0000 mL | Freq: Once | INTRAVENOUS | Status: AC
  Administered 2018-06-30: 10 mL
  Filled 2018-06-30: qty 10

## 2018-06-30 MED ORDER — HEPARIN SOD (PORK) LOCK FLUSH 100 UNIT/ML IV SOLN
500.0000 [IU] | Freq: Once | INTRAVENOUS | Status: AC
  Administered 2018-06-30: 500 [IU]
  Filled 2018-06-30: qty 5

## 2018-07-08 ENCOUNTER — Other Ambulatory Visit: Payer: Self-pay | Admitting: Hematology and Oncology

## 2018-07-21 MED FILL — ZEJULA 100 MG CAPS: 100 | 30 days supply | Qty: 90 | Fill #9

## 2018-08-03 ENCOUNTER — Telehealth: Payer: Self-pay | Admitting: Hematology and Oncology

## 2018-08-03 NOTE — Telephone Encounter (Signed)
Per 8/28 sch message - pt aware of appts.

## 2018-08-07 ENCOUNTER — Other Ambulatory Visit: Payer: Self-pay | Admitting: Hematology and Oncology

## 2018-08-10 ENCOUNTER — Inpatient Hospital Stay: Payer: Medicare Other | Attending: Hematology and Oncology

## 2018-08-10 ENCOUNTER — Encounter (HOSPITAL_COMMUNITY): Payer: Self-pay

## 2018-08-10 ENCOUNTER — Inpatient Hospital Stay: Payer: Medicare Other

## 2018-08-10 ENCOUNTER — Ambulatory Visit (HOSPITAL_COMMUNITY)
Admission: RE | Admit: 2018-08-10 | Discharge: 2018-08-10 | Disposition: A | Discharge status: Home / Self Care | Payer: Medicare Other | Source: Ambulatory Visit | Attending: Hematology and Oncology | Admitting: Hematology and Oncology

## 2018-08-10 DIAGNOSIS — R918 Other nonspecific abnormal finding of lung field: Secondary | ICD-10-CM | POA: Insufficient documentation

## 2018-08-10 DIAGNOSIS — K5909 Other constipation: Secondary | ICD-10-CM | POA: Insufficient documentation

## 2018-08-10 DIAGNOSIS — C563 Malignant neoplasm of bilateral ovaries: Secondary | ICD-10-CM

## 2018-08-10 DIAGNOSIS — N898 Other specified noninflammatory disorders of vagina: Secondary | ICD-10-CM | POA: Insufficient documentation

## 2018-08-10 DIAGNOSIS — Z9221 Personal history of antineoplastic chemotherapy: Secondary | ICD-10-CM | POA: Diagnosis not present

## 2018-08-10 DIAGNOSIS — G629 Polyneuropathy, unspecified: Secondary | ICD-10-CM | POA: Diagnosis not present

## 2018-08-10 DIAGNOSIS — Z79899 Other long term (current) drug therapy: Secondary | ICD-10-CM | POA: Insufficient documentation

## 2018-08-10 DIAGNOSIS — C562 Malignant neoplasm of left ovary: Secondary | ICD-10-CM

## 2018-08-10 DIAGNOSIS — Z7982 Long term (current) use of aspirin: Secondary | ICD-10-CM | POA: Diagnosis not present

## 2018-08-10 DIAGNOSIS — Z8673 Personal history of transient ischemic attack (TIA), and cerebral infarction without residual deficits: Secondary | ICD-10-CM | POA: Diagnosis not present

## 2018-08-10 DIAGNOSIS — D638 Anemia in other chronic diseases classified elsewhere: Secondary | ICD-10-CM

## 2018-08-10 DIAGNOSIS — C561 Malignant neoplasm of right ovary: Secondary | ICD-10-CM

## 2018-08-10 DIAGNOSIS — D649 Anemia, unspecified: Secondary | ICD-10-CM | POA: Diagnosis not present

## 2018-08-10 DIAGNOSIS — D1803 Hemangioma of intra-abdominal structures: Secondary | ICD-10-CM | POA: Insufficient documentation

## 2018-08-10 DIAGNOSIS — I482 Chronic atrial fibrillation: Secondary | ICD-10-CM | POA: Insufficient documentation

## 2018-08-10 DIAGNOSIS — D539 Nutritional anemia, unspecified: Secondary | ICD-10-CM

## 2018-08-10 DIAGNOSIS — Z95828 Presence of other vascular implants and grafts: Secondary | ICD-10-CM

## 2018-08-10 LAB — COMPREHENSIVE METABOLIC PANEL
ALT: 20 U/L (ref 0–44)
AST: 17 U/L (ref 15–41)
Albumin: 3.7 g/dL (ref 3.5–5.0)
Alkaline Phosphatase: 155 U/L — ABNORMAL HIGH (ref 38–126)
Anion gap: 9 (ref 5–15)
BUN: 14 mg/dL (ref 8–23)
CO2: 26 mmol/L (ref 22–32)
Calcium: 9.7 mg/dL (ref 8.9–10.3)
Chloride: 106 mmol/L (ref 98–111)
Creatinine, Ser: 0.99 mg/dL (ref 0.44–1.00)
GFR calc Af Amer: >60 mL/min (ref >60)
GFR calc non Af Amer: 57 mL/min — ABNORMAL LOW (ref >60)
Glucose, Bld: 94 mg/dL (ref 70–99)
Potassium: 3.8 mmol/L (ref 3.5–5.1)
Sodium: 141 mmol/L (ref 135–145)
Total Bilirubin: 0.9 mg/dL (ref 0.3–1.2)
Total Protein: 7.3 g/dL (ref 6.5–8.1)

## 2018-08-10 LAB — CBC WITH DIFFERENTIAL/PLATELET
Basophils Absolute: 0 10*3/uL (ref 0.0–0.1)
Basophils Relative: 1 %
Eosinophils Absolute: 0.1 10*3/uL (ref 0.0–0.5)
Eosinophils Relative: 2 %
HCT: 31.5 % — ABNORMAL LOW (ref 34.8–46.6)
Hemoglobin: 10.7 g/dL — ABNORMAL LOW (ref 11.6–15.9)
Lymphocytes Relative: 44 %
Lymphs Abs: 1.8 10*3/uL (ref 0.9–3.3)
MCH: 40.1 pg — ABNORMAL HIGH (ref 25.1–34.0)
MCHC: 34 g/dL (ref 31.5–36.0)
MCV: 118 fL — ABNORMAL HIGH (ref 79.5–101.0)
Monocytes Absolute: 0.5 10*3/uL (ref 0.1–0.9)
Monocytes Relative: 12 %
Neutro Abs: 1.7 10*3/uL (ref 1.5–6.5)
Neutrophils Relative %: 41 %
Platelets: 198 10*3/uL (ref 145–400)
RBC: 2.67 MIL/uL — ABNORMAL LOW (ref 3.70–5.45)
RDW: 16.6 % — ABNORMAL HIGH (ref 11.2–14.5)
WBC: 4.1 10*3/uL (ref 3.9–10.3)
nRBC: 1 /100 WBC — ABNORMAL HIGH

## 2018-08-10 LAB — SEDIMENTATION RATE: Sed Rate: 61 mm/hr — ABNORMAL HIGH (ref 0–22)

## 2018-08-10 LAB — FERRITIN: Ferritin: 222 ng/mL (ref 11–307)

## 2018-08-10 LAB — VITAMIN B12: Vitamin B-12: 429 pg/mL (ref 180–914)

## 2018-08-10 LAB — IRON AND TIBC
Iron: 103 ug/dL (ref 41–142)
Saturation Ratios: 34 % (ref 21–57)
TIBC: 304 ug/dL (ref 236–444)
UIBC: 201 ug/dL

## 2018-08-10 MED ORDER — HEPARIN SOD (PORK) LOCK FLUSH 100 UNIT/ML IV SOLN
INTRAVENOUS | Status: AC
  Filled 2018-08-10: qty 5

## 2018-08-10 MED ORDER — HEPARIN SOD (PORK) LOCK FLUSH 100 UNIT/ML IV SOLN
500.0000 [IU] | Freq: Once | INTRAVENOUS | Status: AC
  Administered 2018-08-10: 500 [IU] via INTRAVENOUS

## 2018-08-10 MED ORDER — IOHEXOL 300 MG/ML  SOLN
100.0000 mL | Freq: Once | INTRAMUSCULAR | Status: AC | PRN
  Administered 2018-08-10: 100 mL via INTRAVENOUS

## 2018-08-10 MED ORDER — SODIUM CHLORIDE 0.9% FLUSH
10.0000 mL | Freq: Once | INTRAVENOUS | Status: AC
  Administered 2018-08-10: 10 mL
  Filled 2018-08-10: qty 10

## 2018-08-11 ENCOUNTER — Other Ambulatory Visit: Payer: Medicare Other

## 2018-08-12 ENCOUNTER — Encounter: Payer: Self-pay | Admitting: Pharmacist

## 2018-08-12 ENCOUNTER — Encounter: Payer: Self-pay | Admitting: Hematology and Oncology

## 2018-08-12 ENCOUNTER — Inpatient Hospital Stay (HOSPITAL_BASED_OUTPATIENT_CLINIC_OR_DEPARTMENT_OTHER): Payer: Medicare Other | Admitting: Hematology and Oncology

## 2018-08-12 ENCOUNTER — Telehealth: Payer: Self-pay | Admitting: Hematology and Oncology

## 2018-08-12 VITALS — BP 122/69 | HR 87 | Temp 98.1°F | Resp 18 | Ht 69.0 in | Wt 218.6 lb

## 2018-08-12 DIAGNOSIS — R918 Other nonspecific abnormal finding of lung field: Secondary | ICD-10-CM

## 2018-08-12 DIAGNOSIS — I482 Chronic atrial fibrillation, unspecified: Secondary | ICD-10-CM

## 2018-08-12 DIAGNOSIS — D649 Anemia, unspecified: Secondary | ICD-10-CM

## 2018-08-12 DIAGNOSIS — G629 Polyneuropathy, unspecified: Secondary | ICD-10-CM | POA: Diagnosis not present

## 2018-08-12 DIAGNOSIS — Z7982 Long term (current) use of aspirin: Secondary | ICD-10-CM

## 2018-08-12 DIAGNOSIS — K5909 Other constipation: Secondary | ICD-10-CM

## 2018-08-12 DIAGNOSIS — Z8673 Personal history of transient ischemic attack (TIA), and cerebral infarction without residual deficits: Secondary | ICD-10-CM

## 2018-08-12 DIAGNOSIS — Z79899 Other long term (current) drug therapy: Secondary | ICD-10-CM

## 2018-08-12 DIAGNOSIS — D638 Anemia in other chronic diseases classified elsewhere: Secondary | ICD-10-CM

## 2018-08-12 DIAGNOSIS — C562 Malignant neoplasm of left ovary: Secondary | ICD-10-CM

## 2018-08-12 DIAGNOSIS — D1803 Hemangioma of intra-abdominal structures: Secondary | ICD-10-CM

## 2018-08-12 DIAGNOSIS — Z9221 Personal history of antineoplastic chemotherapy: Secondary | ICD-10-CM

## 2018-08-12 NOTE — Assessment & Plan Note (Signed)
She has chronic atrial fibrillation, rate control She is on chronic anticoagulation therapy She denies any bleeding.

## 2018-08-12 NOTE — Telephone Encounter (Signed)
Gave pt avs and calendar  °

## 2018-08-12 NOTE — Assessment & Plan Note (Signed)
She has chronic anemia, likely due to side effects of treatment She is not symptomatic Observe only. 

## 2018-08-12 NOTE — Progress Notes (Signed)
[place] OFFICE PROGRESS NOTE  Patient Care Team: Helane Rima, MD as PCP - General (Family Medicine) Encarnacion Slates, MD as Referring Physician (Neurology)  ASSESSMENT & PLAN:  Ovarian cancer [facility]) I have reviewed recent CT imaging with the patient and family She has no signs of cancer recurrence/progression She will continue with neuropathy indefinitely I plan to see her back in 6 weeks with history, physical examination and blood work  Anemia, chronic disease She has chronic anemia, likely due to side effects of treatment She is not symptomatic Observe only.  Chronic atrial fibrillation (HCC) She has chronic atrial fibrillation, rate control She is on chronic anticoagulation therapy She denies any bleeding.   Orders Placed This Encounter  Procedures  . CA 125    Standing Status:   Standing    Number of Occurrences:   9    Standing Expiration Date:   08/13/2019    INTERVAL HISTORY: Please see below for problem oriented charting. She returns with family members for further follow-up She feels well She is motivated to exercise as tolerated She denies abdominal pain or discomfort She has chronic constipation, stable with laxatives The patient denies any recent signs or symptoms of bleeding such as spontaneous epistaxis, hematuria or hematochezia. She denies difficulties getting her prescription refilled  SUMMARY OF ONCOLOGIC HISTORY: Oncology History   Negative genetic testing     Ovarian cancer ([place])   11/28/2015 Imaging    Ct scan of abdomen: Peritoneal carcinomatosis and pelvic/inguinal adenopathy. Gynecologic primary is favored.    12/17/2015 Pathology Results    Lymph node, needle/core biopsy, L inguinal LAN - METASTATIC ADENOCARCINOMA. Microscopic Comment Immunohistochemistry will be performed and reported as an addendum. (JDP:ecj 12/18/2015) ADDENDUM: Immunohistochemistry shows strong positivity with cytokeratin 7, estrogen receptor (ER),  progesterone receptor (PR), p53 and WT1. Negative markers are cytokeratin 20 , CDX- 2 and gross cystic disease fluid protein. 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 (JDP:kh 12-19-15    12/17/2015 Procedure    Technically successful ultrasound guided core left inguinal adenopathy biopsy    12/24/2015 Tumor Marker    Patient's tumor was tested for the following markers: CA-125 Results of the tumor marker test revealed 608.2    12/27/2015 - 02/28/2016 Chemotherapy    She received neoadjuvant chemo x 3 cycles    01/01/2016 Tumor Marker    Patient's tumor was tested for the following markers: CA-125 Results of the tumor marker test revealed 741.6    01/29/2016 Procedure    Successful placement of a right internal jugular approach power injectable Port-A-Cath. The catheter is ready for immediate use    01/29/2016 Imaging    US abdomen 1. Hyperechoic 2.5 x 1.0 x 1.6 cm lesion in the region the caudate lobe of the liver. Similar finding noted on prior CT of 11/28/2015. This could represent hemangioma. This could represent a malignancy including metastatic disease. 2. Exam otherwise unremarkable. No gallstones. No biliary distention.    02/03/2016 Tumor Marker    Patient's tumor was tested for the following markers: CA-125 Results of the tumor marker test revealed 137.1    02/20/2016 Imaging    MRI brain No acute infarct.  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  No intracranial mass.  Moderate small vessel disease changes.  Global atrophy without hydrocephalus.  Expanded partially empty sella without secondary findings of pseudotumor cerebri.  Minimal mucosal thickening ethmoid sinus air cells. Small air-fluid  levels maxillary sinuses bilaterally may indicate changes of acute sinusitis.     02/28/2016 Tumor Marker    Patient's tumor was tested for the  following markers: CA-125 Results of the tumor marker test revealed 33.6    03/02/2016 Imaging    CT chest, abdomen and pelvis 1. Today's study demonstrates a positive response to therapy with resolution of the previously noted malignant ascites, significant regression of previously noted peritoneal implants, and regression of previously noted lymphadenopathy in the abdomen and pelvis. 2. No definite evidence to suggest metastatic disease to the thorax. 3. Stable 1.0 x 1.7 cm intermediate attenuation lesion associated with the posterior aspect of segment 1 of the liver, favored to represent a mildly proteinaceous hepatic cyst. The possibility of a peritoneal implant in this region is not entirely excluded, but is not favored on today's examination. 4. Extensive post infectious scarring inguinal upper right lung, likely sequela of prior necrotizing pneumonia. 5. Multiple tiny pulmonary nodules scattered throughout the lungs bilaterally all measuring 4 mm or less. These are nonspecific, but favored to be benign. Given the patient's history of primary gynecologic malignancy, attention on followup studies is recommended to ensure the stability of these findings. 6. Additional incidental findings, as above    03/14/2016 Imaging    CT angiogram 1. No pulmonary emboli. 2. Chronic changes to the right upper lobe. 3. Stable lesion in the caudate lobe of the liver. 4. Stable soft tissue prominence to the right of the trachea as described above. 5. Stable small nodule in the right lung.     03/24/2016 Pathology Results    1. Omentum, resection for tumor - FOCAL ADENOCARCINOMA ASSOCIATED WITH EXTENSIVE FIBROSIS, INFLAMMATION AND HEMOSIDERIN DEPOSITION. 2. Uterus +/- tubes/ovaries, neoplastic, cervix - CERVIX: SLIGHT CERVICITIS AND SQUAMOUS METAPLASIA. - ENDOMETRIUM: ATROPHIC, NO HYPERPLASIA OR MALIGNANCY. - MYOMETRIUM: LEIOMYOMATA WITH DEGENERATIVE CHANGES. NO MALIGNANCY. - UTERINE SEROSA: FOCAL  ADENOCARCINOMA ASSOCIATED WITH ADHESIONS. - RIGHT OVARY: BENIGN CALCIFIED NODULE AND BENIGN SEROUS CYST. NO MALIGNANCY IDENTIFIED. - RIGHT FALLOPIAN TUBE: UNREMARKABLE. NO MALIGNANCY IDENTIFIED. - LEFT OVARY: FOCAL ADENOCARCINOMA ASSOCIATED WITH EXTENSIVE FIBROSIS. - LEFT FALLOPIAN TUBE: HYDROSALPINX. NO MALIGNANCY IDENTIFIED. 3. Lymph node, biopsy, right external iliac - METASTATIC ADENOCARCINOMA, 4. Soft tissue, biopsy, left para colic gutter - FOCAL ADENOCARCINOMA ASSOCIATED WITH FIBROSIS. Microscopic Comment 2. OVARY Specimen(s): Uterus with bilateral ovaries, omentum, right external iliac lymph node and left paracolic gutter. Procedure: (including lymph node sampling) Hysterectomy with bilateral salpingo-oophorectomy, omentectomy, lymph node biopsy and paracolic gutter biopsy. Primary tumor site (including laterality): Left ovary. Ovarian surface involvement: Yes Ovarian capsule intact without fragmentation: N/A Maximum tumor size (cm): 2 cm, 2 cm Histologic type: Serous carcinoma. Grade: 3 Peritoneal implants: (specify invasive or non-invasive): Left paracolic gutter positive for carcinoma Pelvic extension (list additional structures on separate lines and if involved): Omentum, right paracolic gutter and uterine serosa. Lymph nodes: number examined 1 ; number positive 1 TNM code: ypT3a, ypN1b, ypMX FIGO Stage (based on pathologic findings, needs clinical correlation): IIIA2 Comments: The left ovary has a 2 cm nodule, which is composed primarily of fibrous tissue with small foci of residual adenocarcinoma. There is also microscopic involvement of the uterine serosa, the left paracolic gutter biopsy and the omentum. The left paracolic gutter and omental involvement is microscopic and both are associated with extensive fibrosis with focal hemosiderin deposition. The right external iliac node is completely replaced by metastatic adenocarcinoma with serous features. (JDP:ecj 03/30/2016)     03/24/2016 Surgery    Operation: Robotic-assisted laparoscopic total hysterectomy  with bilateral salpingoophorectomy, omentectomy, radical tumor debulking  Surgeon: Donaciano Eva  Operative Findings:  : small nodules in omentum, no ascites. Omental nodule (2cm) adherent to lower anterior abdominal wall. 2cm left colonic gutter cystic nodule. 6cm right external iliac lymph node. Grossly normal ovaries and tubes. Fibroid uterus. No gross residual disease at completion of surgery representing R0 optimal/complete resection.     04/17/2016 Imaging    CT abdomen and pelvis 1. There is no evidence of acute inflammatory process within abdomen or pelvis. 2. Stable low-density lesion within caudate lobe of the liver. No new hepatic lesions are noted. 3. Again noted lobulated renal contour and multifocal renal cortical scarring. No hydronephrosis or hydroureter. 4. No significant mesenteric adenopathy. No retroperitoneal adenopathy. Stable minimal residual peritoneal thickening within pelvis. No new pelvic implants or pelvic ascites. 5. Status post hysterectomy.  Unremarkable urinary bladder. 6. Moderate stool within colon as described above. No evidence of colitis.     04/24/2016 - 06/19/2016 Chemotherapy    She received 3 more cycles of chemo after surgery    05/14/2016 Tumor Marker    Patient's tumor was tested for the following markers: CA-125 Results of the tumor marker test revealed 22.5    06/04/2016 Tumor Marker    Patient's tumor was tested for the following markers: CA-125 Results of the tumor marker test revealed 13.7    07/07/2016 Genetic Testing    Patient has genetic testing done for breast/ovasrian cancer panel Results revealed patient has no actionable mutation    07/23/2016 Imaging    Ct abdomen and pelvis 1. Heterogeneously enhancing 4.9 x 3.2 x 4.4 cm mass along the right pelvic sidewall may represent locally recurrent disease or malignant lymphadenopathy. 2. No other  signs of definite metastatic disease noted elsewhere in the abdomen or pelvis. 3. Stable low-attenuation hepatic lesion in the caudate lobe of the liver, which appears to demonstrates some progressive centripetal filling on delayed images. This lesion is presumably benign given its stability compared to prior studies, and is favored to represent a small cavernous hemangioma. 4. Cardiomegaly with biatrial dilatation, which is very severe on the right side. 5. Aortic atherosclerosis. 6. Additional incidental findings, as above.    07/23/2016 Tumor Marker    Patient's tumor was tested for the following markers: CA-125 Results of the tumor marker test revealed 12.3    09/30/2016 Imaging    Ct abdomen and pelvis: 1. 4.9 cm heterogeneously enhancing mass seen along the right pelvic sidewall previously has almost completely resolved. There is some ill-defined soft tissue attenuation/peritoneal thickening in this region today but no discrete measurable lesion is evident. 2. No new or progressive findings on today's exam. 3. Stable hypo attenuating lesion in the caudate lobe of the liver. 4. Subtle aortic atherosclerosis    09/30/2016 Tumor Marker    Patient's tumor was tested for the following markers: CA-125 Results of the tumor marker test revealed 9.6    10/05/2016 Pathology Results    Vagina, biopsy, left cuff - BENIGN FIBROEPITHELIAL (STROMAL) POLYP. - NO DYSPLASIA, ATYPIA OR MALIGNANCY IDENTIFIED.    01/14/2017 Tumor Marker    Patient's tumor was tested for the following markers: CA-125 Results of the tumor marker test revealed 11.9    05/05/2017 Tumor Marker    Patient's tumor was tested for the following markers: CA-125 Results of the tumor marker test revealed 28    06/14/2017 Tumor Marker    Patient's tumor was tested for the following markers: CA-125 Results of the tumor  marker test revealed 45.7    06/24/2017 Imaging    Ct abdomen and pelvis: Increased peritoneal metastatic  disease in abdomen and pelvis since prior exam. No evidence of ascites. Stable small benign hepatic hemangioma.    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 Does of chemo is reduced starting cycle 2 onwards    09/02/2017 Tumor Marker    Patient's tumor was tested for the following markers: CA-125 Results of the tumor marker test revealed 27.7    09/16/2017 Imaging    CT CHEST IMPRESSION  1. No acute process or evidence of metastatic disease in the chest. 2. Right upper lung bronchiectasis, consolidation, and architectural distortion are most likely post infectious or inflammatory and not significantly changed. 3. Aortic Atherosclerosis (ICD10-I70.0).  CT ABDOMEN AND PELVIS IMPRESSION  1. Response to therapy of nodal and peritoneal metastasis. 2. No new or progressive disease. 3. Caudate lobe hemangioma, as before. 4. Bilateral renal cortical thinning. Similar minimal right-sided caliectasis and hydroureter. Likely secondary to low-grade obstruction by the dominant right pelvic implant.    09/22/2017 Tumor Marker    Patient's tumor was tested for the following markers: CA-125 Results of the tumor marker test revealed 18    11/03/2017 Tumor Marker    Patient's tumor was tested for the following markers: CA-125 Results of the tumor marker test revealed 13.4    11/25/2017 Imaging    1. Stable to slight interval decrease in size of nodal and peritoneal metastatic disease within the abdomen. 2. No new or progressive disease.    12/03/2017 -  Chemotherapy    The patient is prescribed Zejula    01/17/2018 Tumor Marker    Patient's tumor was tested for the following markers: CA-125 Results of the tumor marker test revealed 11.9     02/22/2018 Tumor Marker    Patient's tumor was tested for the following markers: CA-125 Results of the tumor marker test revealed 11.7    02/22/2018 Imaging    1. Improved nodular thickening along the vaginal cuff on the left and medial to the right iliac vessels, likely treated implants. No evidence of progressive peritoneal disease. 2. Stable hepatic hemangioma. No evidence of solid visceral organ metastases. 3. Bilateral renal cortical scarring.    05/19/2018 Tumor Marker    Patient's tumor was tested for the following markers: CA-125 Results of the tumor marker test revealed 9.9    08/10/2018 Imaging    Stable mild nodular thickening of the left vaginal cuff. No new or progressive disease within the abdomen or pelvis.  Stable small hepatic hemangioma.     REVIEW OF SYSTEMS:   Constitutional: Denies fevers, chills or abnormal weight loss Eyes: Denies blurriness of vision Ears, nose, mouth, throat, and face: Denies mucositis or sore throat Respiratory: Denies cough, dyspnea or wheezes Cardiovascular: Denies palpitation, chest discomfort or lower extremity swelling Gastrointestinal:  Denies nausea, heartburn or change in bowel habits Skin: Denies abnormal skin rashes Lymphatics: Denies new lymphadenopathy or easy bruising Neurological:Denies numbness, tingling or new weaknesses Behavioral/Psych: Mood is stable, no new changes  All other systems were reviewed with the patient and are negative.  I have reviewed the past medical history, past surgical history, social history and family history with the  patient and they are unchanged from previous note.  ALLERGIES:  is allergic to codeine; lactulose; and latex.  MEDICATIONS:  Current Outpatient Medications  Medication Sig Dispense Refill  . apixaban (ELIQUIS) 5 MG TABS tablet Take 1 tablet (5 mg total) by mouth 2 (two) times daily. 60 tablet 5  . aspirin EC 81 MG tablet Take 81 mg by mouth daily.    Marland Kitchen atorvastatin (LIPITOR) 40 MG  tablet Take 40 mg by mouth at bedtime.    . gabapentin (NEURONTIN) 300 MG capsule Take 1 capsule (300 mg total) 3 (three) times daily by mouth. 90 capsule 3  . gabapentin (NEURONTIN) 300 MG capsule TAKE ONE CAPSULE BY MOUTH TWICE DAILY 60 capsule 0  . lidocaine-prilocaine (EMLA) cream Apply to Porta-Cath 1-2 hours prior to access as directed. 30 g 1  . loratadine (CLARITIN) 10 MG tablet Take 10 mg by mouth daily as needed (Recommended for chemo pain). Reported on 03/16/2016    . metoprolol succinate (TOPROL-XL) 25 MG 24 hr tablet   5  . mirtazapine (REMERON) 15 MG tablet Take 15 mg by mouth at bedtime.    Alanda Slim Tosylate (ZEJULA) 100 MG CAPS Take 300 mg by mouth daily. 90 capsule 11  . omega-3 acid ethyl esters (LOVAZA) 1 g capsule Take 1 g by mouth daily.    Marland Kitchen oxyCODONE (OXY IR/ROXICODONE) 5 MG immediate release tablet Take 1 tablet (5 mg total) by mouth every 4 (four) hours as needed for severe pain. 90 tablet 0  . PARoxetine (PAXIL) 20 MG tablet TAKE 1 TABLET BY MOUTH DAILY (Patient taking differently: TAKE 20 MG BY MOUTH DAILY) 90 tablet 0  . polyethylene glycol (MIRALAX / GLYCOLAX) packet Take 17 g by mouth daily.    . verapamil (CALAN-SR) 240 MG CR tablet Take 1 tablet (240 mg total) by mouth daily. 30 tablet 0   No current facility-administered medications for this visit.     PHYSICAL EXAMINATION: ECOG PERFORMANCE STATUS: 1 - Symptomatic but completely ambulatory  Vitals:   08/12/18 0950  BP: 122/69  Pulse: 87  Resp: 18  Temp: 98.1 F (36.7 C)  SpO2: 100%   Filed Weights   08/12/18 0950  Weight: 218 lb 9.6 oz (99.2 kg)    GENERAL:alert, no distress and comfortable SKIN: skin color, texture, turgor are normal, no rashes or significant lesions EYES: normal, Conjunctiva are pink and non-injected, sclera clear OROPHARYNX:no exudate, no erythema and lips, buccal mucosa, and tongue normal  NECK: supple, thyroid normal size, non-tender, without nodularity LYMPH:  no  palpable lymphadenopathy in the cervical, axillary or inguinal LUNGS: clear to auscultation and percussion with normal breathing effort HEART: She has irregular rate and rhythm, no murmurs no lower extremity edema ABDOMEN:abdomen soft, non-tender and normal bowel sounds Musculoskeletal:no cyanosis of digits and no clubbing  NEURO: alert & oriented x 3 with mild dysplasia, consistent with prior stroke with right hemiparesis  LABORATORY DATA:  I have reviewed the data as listed    Component Value Date/Time   NA 141 08/10/2018 0918   NA 142 12/08/2017 1051   K 3.8 08/10/2018 0918   K 4.4 12/08/2017 1051   CL 106 08/10/2018 0918   CL 105 06/14/2017 1430   CO2 26 08/10/2018 0918   CO2 28 12/08/2017 1051   GLUCOSE 94 08/10/2018 0918   GLUCOSE 97 12/08/2017 1051   BUN 14 08/10/2018 0918   BUN 16.2 12/08/2017 1051   CREATININE 0.99 08/10/2018 0918   CREATININE 0.9 12/08/2017 1051  CALCIUM 9.7 08/10/2018 0918   CALCIUM 9.5 12/08/2017 1051   PROT 7.3 08/10/2018 0918   PROT 6.9 12/08/2017 1051   ALBUMIN 3.7 08/10/2018 0918   ALBUMIN 3.6 12/08/2017 1051   AST 17 08/10/2018 0918   AST 18 12/08/2017 1051   ALT 20 08/10/2018 0918   ALT 20 12/08/2017 1051   ALKPHOS 155 (H) 08/10/2018 0918   ALKPHOS 131 12/08/2017 1051   BILITOT 0.9 08/10/2018 0918   BILITOT 0.70 12/08/2017 1051   GFRNONAA 57 (L) 08/10/2018 0918   GFRAA >60 08/10/2018 0918    No results found for: SPEP, UPEP  Lab Results  Component Value Date   WBC 4.1 08/10/2018   NEUTROABS 1.7 08/10/2018   HGB 10.7 (L) 08/10/2018   HCT 31.5 (L) 08/10/2018   MCV 118.0 (H) 08/10/2018   PLT 198 08/10/2018      Chemistry      Component Value Date/Time   NA 141 08/10/2018 0918   NA 142 12/08/2017 1051   K 3.8 08/10/2018 0918   K 4.4 12/08/2017 1051   CL 106 08/10/2018 0918   CL 105 06/14/2017 1430   CO2 26 08/10/2018 0918   CO2 28 12/08/2017 1051   BUN 14 08/10/2018 0918   BUN 16.2 12/08/2017 1051   CREATININE 0.99  08/10/2018 0918   CREATININE 0.9 12/08/2017 1051      Component Value Date/Time   CALCIUM 9.7 08/10/2018 0918   CALCIUM 9.5 12/08/2017 1051   ALKPHOS 155 (H) 08/10/2018 0918   ALKPHOS 131 12/08/2017 1051   AST 17 08/10/2018 0918   AST 18 12/08/2017 1051   ALT 20 08/10/2018 0918   ALT 20 12/08/2017 1051   BILITOT 0.9 08/10/2018 0918   BILITOT 0.70 12/08/2017 1051       RADIOGRAPHIC STUDIES: I have reviewed multiple imaging studies with patient and family I have personally reviewed the radiological images as listed and agreed with the findings in the report. Ct Abdomen Pelvis W Contrast  Result Date: 08/10/2018 CLINICAL DATA:  Follow-up left ovarian carcinoma. Remote personal history of left breast carcinoma. EXAM: CT ABDOMEN AND PELVIS WITH CONTRAST TECHNIQUE: Multidetector CT imaging of the abdomen and pelvis was performed using the standard protocol following bolus administration of intravenous contrast. CONTRAST:  178m OMNIPAQUE IOHEXOL 300 MG/ML  SOLN COMPARISON:  02/22/2018 FINDINGS: Lower Chest: No acute findings. Hepatobiliary: Stable 1.9 cm benign hemangioma involving the caudate lobe, which is more definitively characterized on previous CTs dating back to 2017. No new or enlarging liver masses are identified. Gallbladder is unremarkable. Pancreas:  No mass or inflammatory changes. Spleen: Within normal limits in size and appearance. Adrenals/Urinary Tract: No masses identified. Tiny right renal cyst. Bilateral renal parenchymal scarring, left side greater than right. No evidence of hydronephrosis. Unremarkable unopacified urinary bladder. Stomach/Bowel: No evidence of obstruction, inflammatory process or abnormal fluid collections. Vascular/Lymphatic: No pathologically enlarged lymph nodes. A few tiny sub-cm right lower quadrant mesenteric lymph nodes remain stable. No abdominal aortic aneurysm. Reproductive: Prior hysterectomy. Soft tissue nodule along the left vaginal cuff measuring  2.4 x 1.5 cm shows no significant change. Adnexal regions are otherwise unremarkable. Other:  No evidence of peritoneal thickening or ascites. Musculoskeletal:  No suspicious bone lesions identified. IMPRESSION: Stable mild nodular thickening of the left vaginal cuff. No new or progressive disease within the abdomen or pelvis. Stable small hepatic hemangioma. Electronically Signed   By: JEarle GellM.D.   On: 08/10/2018 13:53    All questions were answered. The  patient knows to call the clinic with any problems, questions or concerns. No barriers to learning was detected.  I spent 15 minutes counseling the patient face to face. The total time spent in the appointment was 20 minutes and more than 50% was on counseling and review of test results  Heath Lark, MD 08/12/2018 10:10 AM

## 2018-08-12 NOTE — Assessment & Plan Note (Signed)
I have reviewed recent CT imaging with the patient and family She has no signs of cancer recurrence/progression She will continue with neuropathy indefinitely I plan to see her back in 6 weeks with history, physical examination and blood work

## 2018-08-17 MED FILL — ZEJULA 100 MG CAPS: 100 | 30 days supply | Qty: 90 | Fill #10

## 2018-08-31 ENCOUNTER — Other Ambulatory Visit: Payer: Self-pay

## 2018-08-31 ENCOUNTER — Telehealth: Payer: Self-pay | Admitting: *Deleted

## 2018-08-31 MED ORDER — GABAPENTIN 600 MG PO TABS: 600.0000 mg | ORAL_TABLET | Freq: Two times a day (BID) | ORAL | 11 refills | Status: DC

## 2018-08-31 NOTE — Telephone Encounter (Signed)
Called regarding below message. She is taking 300 mg BID, instructed per message to take 600 mg BID. Rx sent to pharmacy. Daughter and Ms. Malter verbalized understanding.

## 2018-08-31 NOTE — Telephone Encounter (Signed)
Please call patient about increasing her gabpentin due to increase in symptoms

## 2018-08-31 NOTE — Telephone Encounter (Signed)
Is she taking 300 mg twice or three times a day? If 300 mg BID, I recommend increase to 600 mg BID If 300 mg TID, I recommend increase to 600 mg TID Call in to pharmacy, 1 month supply with 11 refills

## 2018-09-12 MED FILL — ZEJULA 100 MG CAPS: 100 | 30 days supply | Qty: 90 | Fill #11

## 2018-09-14 ENCOUNTER — Telehealth: Payer: Self-pay

## 2018-09-14 ENCOUNTER — Other Ambulatory Visit: Payer: Self-pay

## 2018-09-14 DIAGNOSIS — C563 Malignant neoplasm of bilateral ovaries: Secondary | ICD-10-CM

## 2018-09-14 DIAGNOSIS — C561 Malignant neoplasm of right ovary: Secondary | ICD-10-CM

## 2018-09-14 DIAGNOSIS — C562 Malignant neoplasm of left ovary: Principal | ICD-10-CM

## 2018-09-14 MED ORDER — LIDOCAINE-PRILOCAINE 2.5-2.5 % EX CREA: TOPICAL_CREAM | CUTANEOUS | 1 refills | Status: DC

## 2018-09-14 NOTE — Telephone Encounter (Signed)
Daughter called and left a message requesting Rx for Emla cream sent to [facility] pharmacy.  Rx sent to pharmacy.

## 2018-09-21 ENCOUNTER — Encounter: Payer: Self-pay | Admitting: Hematology and Oncology

## 2018-09-21 ENCOUNTER — Inpatient Hospital Stay (HOSPITAL_BASED_OUTPATIENT_CLINIC_OR_DEPARTMENT_OTHER): Payer: Medicare Other | Admitting: Hematology and Oncology

## 2018-09-21 ENCOUNTER — Telehealth: Payer: Self-pay | Admitting: Hematology and Oncology

## 2018-09-21 ENCOUNTER — Inpatient Hospital Stay: Payer: Medicare Other | Attending: Hematology and Oncology

## 2018-09-21 ENCOUNTER — Inpatient Hospital Stay: Payer: Medicare Other

## 2018-09-21 DIAGNOSIS — C562 Malignant neoplasm of left ovary: Secondary | ICD-10-CM

## 2018-09-21 DIAGNOSIS — Z7901 Long term (current) use of anticoagulants: Secondary | ICD-10-CM | POA: Diagnosis not present

## 2018-09-21 DIAGNOSIS — C561 Malignant neoplasm of right ovary: Secondary | ICD-10-CM

## 2018-09-21 DIAGNOSIS — D630 Anemia in neoplastic disease: Secondary | ICD-10-CM | POA: Insufficient documentation

## 2018-09-21 DIAGNOSIS — Z7982 Long term (current) use of aspirin: Secondary | ICD-10-CM | POA: Diagnosis not present

## 2018-09-21 DIAGNOSIS — K5900 Constipation, unspecified: Secondary | ICD-10-CM

## 2018-09-21 DIAGNOSIS — I69351 Hemiplegia and hemiparesis following cerebral infarction affecting right dominant side: Secondary | ICD-10-CM | POA: Insufficient documentation

## 2018-09-21 DIAGNOSIS — Z95828 Presence of other vascular implants and grafts: Secondary | ICD-10-CM

## 2018-09-21 DIAGNOSIS — C563 Malignant neoplasm of bilateral ovaries: Secondary | ICD-10-CM

## 2018-09-21 DIAGNOSIS — Z9221 Personal history of antineoplastic chemotherapy: Secondary | ICD-10-CM

## 2018-09-21 DIAGNOSIS — D638 Anemia in other chronic diseases classified elsewhere: Secondary | ICD-10-CM

## 2018-09-21 DIAGNOSIS — R918 Other nonspecific abnormal finding of lung field: Secondary | ICD-10-CM

## 2018-09-21 DIAGNOSIS — Z79899 Other long term (current) drug therapy: Secondary | ICD-10-CM | POA: Insufficient documentation

## 2018-09-21 DIAGNOSIS — G8191 Hemiplegia, unspecified affecting right dominant side: Secondary | ICD-10-CM

## 2018-09-21 LAB — COMPREHENSIVE METABOLIC PANEL
ALT: 16 U/L (ref 0–44)
AST: 18 U/L (ref 15–41)
Albumin: 3.9 g/dL (ref 3.5–5.0)
Alkaline Phosphatase: 137 U/L — ABNORMAL HIGH (ref 38–126)
Anion gap: 9 (ref 5–15)
BUN: 14 mg/dL (ref 8–23)
CO2: 26 mmol/L (ref 22–32)
Calcium: 9.9 mg/dL (ref 8.9–10.3)
Chloride: 105 mmol/L (ref 98–111)
Creatinine, Ser: 1.02 mg/dL — ABNORMAL HIGH (ref 0.44–1.00)
GFR calc Af Amer: >60 mL/min (ref >60)
GFR calc non Af Amer: 55 mL/min — ABNORMAL LOW (ref >60)
Glucose, Bld: 101 mg/dL — ABNORMAL HIGH (ref 70–99)
Potassium: 3.9 mmol/L (ref 3.5–5.1)
Sodium: 140 mmol/L (ref 135–145)
Total Bilirubin: 1 mg/dL (ref 0.3–1.2)
Total Protein: 7.6 g/dL (ref 6.5–8.1)

## 2018-09-21 LAB — CBC WITH DIFFERENTIAL/PLATELET
Abs Immature Granulocytes: 0.02 10*3/uL (ref 0.00–0.07)
Basophils Absolute: 0 10*3/uL (ref 0.0–0.1)
Basophils Relative: 1 %
Eosinophils Absolute: 0.1 10*3/uL (ref 0.0–0.5)
Eosinophils Relative: 2 %
HCT: 31.2 % — ABNORMAL LOW (ref 36.0–46.0)
Hemoglobin: 10.7 g/dL — ABNORMAL LOW (ref 12.0–15.0)
Immature Granulocytes: 1 %
Lymphocytes Relative: 36 %
Lymphs Abs: 1.5 10*3/uL (ref 0.7–4.0)
MCH: 40.8 pg — ABNORMAL HIGH (ref 26.0–34.0)
MCHC: 34.3 g/dL (ref 30.0–36.0)
MCV: 119.1 fL — ABNORMAL HIGH (ref 80.0–100.0)
Monocytes Absolute: 0.5 10*3/uL (ref 0.1–1.0)
Monocytes Relative: 10 %
Neutro Abs: 2.2 10*3/uL (ref 1.7–7.7)
Neutrophils Relative %: 50 %
Platelets: 164 10*3/uL (ref 150–400)
RBC: 2.62 MIL/uL — ABNORMAL LOW (ref 3.87–5.11)
RDW: 16.2 % — ABNORMAL HIGH (ref 11.5–15.5)
WBC: 4.3 10*3/uL (ref 4.0–10.5)
nRBC: 2.1 % — ABNORMAL HIGH (ref 0.0–0.2)

## 2018-09-21 MED ORDER — SODIUM CHLORIDE 0.9% FLUSH
10.0000 mL | Freq: Once | INTRAVENOUS | Status: AC
  Administered 2018-09-21: 10 mL
  Filled 2018-09-21: qty 10

## 2018-09-21 MED ORDER — OXYCODONE HCL 5 MG PO TABS: 5.0000 mg | ORAL_TABLET | Freq: Four times a day (QID) | ORAL | 0 refills | Status: DC | PRN

## 2018-09-21 MED ORDER — HEPARIN SOD (PORK) LOCK FLUSH 100 UNIT/ML IV SOLN
500.0000 [IU] | Freq: Once | INTRAVENOUS | Status: AC
  Administered 2018-09-21: 500 [IU]
  Filled 2018-09-21: qty 5

## 2018-09-21 NOTE — Telephone Encounter (Signed)
Gave patient avs and calendar.   °

## 2018-09-21 NOTE — Assessment & Plan Note (Signed)
She has chronic neurological deficit due to her stroke She felt that the Niraparib might have caused some of her weakness It is unusual.  I have discontinue Remeron and other potential medications that could cause her weakness. I recommend she consult with her primary doctor to see if the dose of atorvastatin can be reduced as it can cause muscular weakness

## 2018-09-21 NOTE — Assessment & Plan Note (Signed)
Last CT imaging showed no signs of cancer recurrence/progression She will continue with Niraparib indefinitely I plan to see her back in 12 weeks with history, physical examination and blood work She will return in 6-week with blood work and port flush

## 2018-09-21 NOTE — Progress Notes (Signed)
[place] OFFICE PROGRESS NOTE  Patient Care Team: Helane Rima, MD as PCP - General (Family Medicine) Encarnacion Slates, MD as Referring Physician (Neurology)  ASSESSMENT & PLAN:  Ovarian cancer [facility]) Last CT imaging showed no signs of cancer recurrence/progression She will continue with Niraparib indefinitely I plan to see her back in 12 weeks with history, physical examination and blood work She will return in 6-week with blood work and port flush  Anemia, chronic disease She has chronic anemia, likely due to side effects of treatment She is not symptomatic Observe only.  Right hemiparesis ([place]) She has chronic neurological deficit due to her stroke She felt that the Niraparib might have caused some of her weakness It is unusual.  I have discontinue Remeron and other potential medications that could cause her weakness. I recommend she consult with her primary doctor to see if the dose of atorvastatin can be reduced as it can cause muscular weakness    No orders of the defined types were placed in this encounter.   INTERVAL HISTORY: Please see below for problem oriented charting. She returns with her family for further follow-up She tolerated treatment well Denies abdominal pain, nausea or bloating She has chronic constipation, stable She complained of some mild weakness recently especially in the evening time and thought it could be related to her chemotherapy pill No recent infection, fever or chills The patient denies any recent signs or symptoms of bleeding such as spontaneous epistaxis, hematuria or hematochezia.  SUMMARY OF ONCOLOGIC HISTORY: Oncology History   Negative genetic testing     Ovarian cancer ([place])   11/28/2015 Imaging    Ct scan of abdomen: Peritoneal carcinomatosis and pelvic/inguinal adenopathy. Gynecologic primary is favored.    12/17/2015 Pathology Results    Lymph node, needle/core biopsy, L inguinal LAN - METASTATIC  ADENOCARCINOMA. Microscopic Comment Immunohistochemistry will be performed and reported as an addendum. (JDP:ecj 12/18/2015) ADDENDUM: Immunohistochemistry shows strong positivity with cytokeratin 7, estrogen receptor (ER), progesterone receptor (PR), p53 and WT1. Negative markers are cytokeratin 20 , CDX- 2 and gross cystic disease fluid protein. 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 (JDP:kh 12-19-15    12/17/2015 Procedure    Technically successful ultrasound guided core left inguinal adenopathy biopsy    12/24/2015 Tumor Marker    Patient's tumor was tested for the following markers: CA-125 Results of the tumor marker test revealed 608.2    12/27/2015 - 02/28/2016 Chemotherapy    She received neoadjuvant chemo x 3 cycles    01/01/2016 Tumor Marker    Patient's tumor was tested for the following markers: CA-125 Results of the tumor marker test revealed 741.6    01/29/2016 Procedure    Successful placement of a right internal jugular approach power injectable Port-A-Cath. The catheter is ready for immediate use    01/29/2016 Imaging    US abdomen 1. Hyperechoic 2.5 x 1.0 x 1.6 cm lesion in the region the caudate lobe of the liver. Similar finding noted on prior CT of 11/28/2015. This could represent hemangioma. This could represent a malignancy including metastatic disease. 2. Exam otherwise unremarkable. No gallstones. No biliary distention.    02/03/2016 Tumor Marker    Patient's tumor was tested for the following markers: CA-125 Results of the tumor marker test revealed 137.1    02/20/2016 Imaging    MRI brain No acute infarct.  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  No  intracranial mass.  Moderate small vessel disease changes.  Global atrophy without hydrocephalus.  Expanded partially empty sella without secondary findings of pseudotumor  cerebri.  Minimal mucosal thickening ethmoid sinus air cells. Small air-fluid levels maxillary sinuses bilaterally may indicate changes of acute sinusitis.     02/28/2016 Tumor Marker    Patient's tumor was tested for the following markers: CA-125 Results of the tumor marker test revealed 33.6    03/02/2016 Imaging    CT chest, abdomen and pelvis 1. Today's study demonstrates a positive response to therapy with resolution of the previously noted malignant ascites, significant regression of previously noted peritoneal implants, and regression of previously noted lymphadenopathy in the abdomen and pelvis. 2. No definite evidence to suggest metastatic disease to the thorax. 3. Stable 1.0 x 1.7 cm intermediate attenuation lesion associated with the posterior aspect of segment 1 of the liver, favored to represent a mildly proteinaceous hepatic cyst. The possibility of a peritoneal implant in this region is not entirely excluded, but is not favored on today's examination. 4. Extensive post infectious scarring inguinal upper right lung, likely sequela of prior necrotizing pneumonia. 5. Multiple tiny pulmonary nodules scattered throughout the lungs bilaterally all measuring 4 mm or less. These are nonspecific, but favored to be benign. Given the patient's history of primary gynecologic malignancy, attention on followup studies is recommended to ensure the stability of these findings. 6. Additional incidental findings, as above    03/14/2016 Imaging    CT angiogram 1. No pulmonary emboli. 2. Chronic changes to the right upper lobe. 3. Stable lesion in the caudate lobe of the liver. 4. Stable soft tissue prominence to the right of the trachea as described above. 5. Stable small nodule in the right lung.     03/24/2016 Pathology Results    1. Omentum, resection for tumor - FOCAL ADENOCARCINOMA ASSOCIATED WITH EXTENSIVE FIBROSIS, INFLAMMATION AND HEMOSIDERIN DEPOSITION. 2. Uterus +/- tubes/ovaries,  neoplastic, cervix - CERVIX: SLIGHT CERVICITIS AND SQUAMOUS METAPLASIA. - ENDOMETRIUM: ATROPHIC, NO HYPERPLASIA OR MALIGNANCY. - MYOMETRIUM: LEIOMYOMATA WITH DEGENERATIVE CHANGES. NO MALIGNANCY. - UTERINE SEROSA: FOCAL ADENOCARCINOMA ASSOCIATED WITH ADHESIONS. - RIGHT OVARY: BENIGN CALCIFIED NODULE AND BENIGN SEROUS CYST. NO MALIGNANCY IDENTIFIED. - RIGHT FALLOPIAN TUBE: UNREMARKABLE. NO MALIGNANCY IDENTIFIED. - LEFT OVARY: FOCAL ADENOCARCINOMA ASSOCIATED WITH EXTENSIVE FIBROSIS. - LEFT FALLOPIAN TUBE: HYDROSALPINX. NO MALIGNANCY IDENTIFIED. 3. Lymph node, biopsy, right external iliac - METASTATIC ADENOCARCINOMA, 4. Soft tissue, biopsy, left para colic gutter - FOCAL ADENOCARCINOMA ASSOCIATED WITH FIBROSIS. Microscopic Comment 2. OVARY Specimen(s): Uterus with bilateral ovaries, omentum, right external iliac lymph node and left paracolic gutter. Procedure: (including lymph node sampling) Hysterectomy with bilateral salpingo-oophorectomy, omentectomy, lymph node biopsy and paracolic gutter biopsy. Primary tumor site (including laterality): Left ovary. Ovarian surface involvement: Yes Ovarian capsule intact without fragmentation: N/A Maximum tumor size (cm): 2 cm, 2 cm Histologic type: Serous carcinoma. Grade: 3 Peritoneal implants: (specify invasive or non-invasive): Left paracolic gutter positive for carcinoma Pelvic extension (list additional structures on separate lines and if involved): Omentum, right paracolic gutter and uterine serosa. Lymph nodes: number examined 1 ; number positive 1 TNM code: ypT3a, ypN1b, ypMX FIGO Stage (based on pathologic findings, needs clinical correlation): IIIA2 Comments: The left ovary has a 2 cm nodule, which is composed primarily of fibrous tissue with small foci of residual adenocarcinoma. There is also microscopic involvement of the uterine serosa, the left paracolic gutter biopsy and the omentum. The left paracolic gutter and omental involvement is  microscopic and both are associated with extensive  fibrosis with focal hemosiderin deposition. The right external iliac node is completely replaced by metastatic adenocarcinoma with serous features. (JDP:ecj 03/30/2016)    03/24/2016 Surgery    Operation: Robotic-assisted laparoscopic total hysterectomy with bilateral salpingoophorectomy, omentectomy, radical tumor debulking  Surgeon: Donaciano Eva  Operative Findings:  : small nodules in omentum, no ascites. Omental nodule (2cm) adherent to lower anterior abdominal wall. 2cm left colonic gutter cystic nodule. 6cm right external iliac lymph node. Grossly normal ovaries and tubes. Fibroid uterus. No gross residual disease at completion of surgery representing R0 optimal/complete resection.     04/17/2016 Imaging    CT abdomen and pelvis 1. There is no evidence of acute inflammatory process within abdomen or pelvis. 2. Stable low-density lesion within caudate lobe of the liver. No new hepatic lesions are noted. 3. Again noted lobulated renal contour and multifocal renal cortical scarring. No hydronephrosis or hydroureter. 4. No significant mesenteric adenopathy. No retroperitoneal adenopathy. Stable minimal residual peritoneal thickening within pelvis. No new pelvic implants or pelvic ascites. 5. Status post hysterectomy.  Unremarkable urinary bladder. 6. Moderate stool within colon as described above. No evidence of colitis.     04/24/2016 - 06/19/2016 Chemotherapy    She received 3 more cycles of chemo after surgery    05/14/2016 Tumor Marker    Patient's tumor was tested for the following markers: CA-125 Results of the tumor marker test revealed 22.5    06/04/2016 Tumor Marker    Patient's tumor was tested for the following markers: CA-125 Results of the tumor marker test revealed 13.7    07/07/2016 Genetic Testing    Patient has genetic testing done for breast/ovasrian cancer panel Results revealed patient has no actionable  mutation    07/23/2016 Imaging    Ct abdomen and pelvis 1. Heterogeneously enhancing 4.9 x 3.2 x 4.4 cm mass along the right pelvic sidewall may represent locally recurrent disease or malignant lymphadenopathy. 2. No other signs of definite metastatic disease noted elsewhere in the abdomen or pelvis. 3. Stable low-attenuation hepatic lesion in the caudate lobe of the liver, which appears to demonstrates some progressive centripetal filling on delayed images. This lesion is presumably benign given its stability compared to prior studies, and is favored to represent a small cavernous hemangioma. 4. Cardiomegaly with biatrial dilatation, which is very severe on the right side. 5. Aortic atherosclerosis. 6. Additional incidental findings, as above.    07/23/2016 Tumor Marker    Patient's tumor was tested for the following markers: CA-125 Results of the tumor marker test revealed 12.3    09/30/2016 Imaging    Ct abdomen and pelvis: 1. 4.9 cm heterogeneously enhancing mass seen along the right pelvic sidewall previously has almost completely resolved. There is some ill-defined soft tissue attenuation/peritoneal thickening in this region today but no discrete measurable lesion is evident. 2. No new or progressive findings on today's exam. 3. Stable hypo attenuating lesion in the caudate lobe of the liver. 4. Subtle aortic atherosclerosis    09/30/2016 Tumor Marker    Patient's tumor was tested for the following markers: CA-125 Results of the tumor marker test revealed 9.6    10/05/2016 Pathology Results    Vagina, biopsy, left cuff - BENIGN FIBROEPITHELIAL (STROMAL) POLYP. - NO DYSPLASIA, ATYPIA OR MALIGNANCY IDENTIFIED.    01/14/2017 Tumor Marker    Patient's tumor was tested for the following markers: CA-125 Results of the tumor marker test revealed 11.9    05/05/2017 Tumor Marker    Patient's tumor was tested for  the following markers: CA-125 Results of the tumor marker test revealed  28    06/14/2017 Tumor Marker    Patient's tumor was tested for the following markers: CA-125 Results of the tumor marker test revealed 45.7    06/24/2017 Imaging    Ct abdomen and pelvis: Increased peritoneal metastatic disease in abdomen and pelvis since prior exam. No evidence of ascites. Stable small benign hepatic hemangioma.    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 Does of chemo is reduced starting cycle 2 onwards    09/02/2017 Tumor Marker    Patient's tumor was tested for the following markers: CA-125 Results of the tumor marker test revealed 27.7    09/16/2017 Imaging    CT CHEST IMPRESSION  1. No acute process or evidence of metastatic disease in the chest. 2. Right upper lung bronchiectasis, consolidation, and architectural distortion are most likely post infectious or inflammatory and not significantly changed. 3. Aortic Atherosclerosis (ICD10-I70.0).  CT ABDOMEN AND PELVIS IMPRESSION  1. Response to therapy of nodal and peritoneal metastasis. 2. No new or progressive disease. 3. Caudate lobe hemangioma, as before. 4. Bilateral renal cortical thinning. Similar minimal right-sided caliectasis and hydroureter. Likely secondary to low-grade obstruction by the dominant right pelvic implant.    09/22/2017 Tumor Marker    Patient's tumor was tested for the following markers: CA-125 Results of the tumor marker test revealed 18    11/03/2017 Tumor Marker    Patient's tumor was tested for the following markers: CA-125 Results of the tumor marker test revealed 13.4    11/25/2017 Imaging    1. Stable to slight interval decrease in size of nodal and peritoneal metastatic disease within the abdomen. 2. No new or progressive  disease.    12/03/2017 -  Chemotherapy    The patient is prescribed Zejula    01/17/2018 Tumor Marker    Patient's tumor was tested for the following markers: CA-125 Results of the tumor marker test revealed 11.9    02/22/2018 Tumor Marker    Patient's tumor was tested for the following markers: CA-125 Results of the tumor marker test revealed 11.7    02/22/2018 Imaging    1. Improved nodular thickening along the vaginal cuff on the left and medial to the right iliac vessels, likely treated implants. No evidence of progressive peritoneal disease. 2. Stable hepatic hemangioma. No evidence of solid visceral organ metastases. 3. Bilateral renal cortical scarring.    05/19/2018 Tumor Marker    Patient's tumor was tested for the following markers: CA-125 Results of the tumor marker test revealed 9.9    08/10/2018 Imaging    Stable mild nodular thickening of the left vaginal cuff. No new or progressive disease within the abdomen or pelvis.  Stable small hepatic hemangioma.     REVIEW OF SYSTEMS:   Constitutional: Denies fevers, chills or abnormal weight loss Eyes: Denies blurriness of vision Ears, nose, mouth, throat, and face: Denies mucositis or sore throat Respiratory: Denies cough, dyspnea or wheezes Cardiovascular: Denies palpitation, chest discomfort or lower extremity swelling Gastrointestinal:  Denies nausea, heartburn or change in bowel habits Skin: Denies abnormal skin rashes Lymphatics: Denies new lymphadenopathy or easy bruising Neurological:Denies numbness, tingling or new weaknesses Behavioral/Psych: Mood is  stable, no new changes  All other systems were reviewed with the patient and are negative.  I have reviewed the past medical history, past surgical history, social history and family history with the patient and they are unchanged from previous note.  ALLERGIES:  is allergic to codeine; lactulose; and latex.  MEDICATIONS:  Current Outpatient Medications   Medication Sig Dispense Refill  . apixaban (ELIQUIS) 5 MG TABS tablet Take 1 tablet (5 mg total) by mouth 2 (two) times daily. 60 tablet 5  . aspirin EC 81 MG tablet Take 81 mg by mouth daily.    Marland Kitchen atorvastatin (LIPITOR) 40 MG tablet Take 40 mg by mouth at bedtime.    . gabapentin (NEURONTIN) 600 MG tablet Take 1 tablet (600 mg total) by mouth 2 (two) times daily. 60 tablet 11  . lidocaine-prilocaine (EMLA) cream Apply to Porta-Cath 1-2 hours prior to access as directed. 30 g 1  . loratadine (CLARITIN) 10 MG tablet Take 10 mg by mouth daily as needed (Recommended for chemo pain). Reported on 03/16/2016    . metoprolol succinate (TOPROL-XL) 25 MG 24 hr tablet   5  . Niraparib Tosylate (ZEJULA) 100 MG CAPS Take 300 mg by mouth daily. 90 capsule 11  . omega-3 acid ethyl esters (LOVAZA) 1 g capsule Take 1 g by mouth daily.    Marland Kitchen oxyCODONE (OXY IR/ROXICODONE) 5 MG immediate release tablet Take 1 tablet (5 mg total) by mouth every 6 (six) hours as needed for severe pain. 90 tablet 0  . PARoxetine (PAXIL) 20 MG tablet TAKE 1 TABLET BY MOUTH DAILY (Patient taking differently: TAKE 20 MG BY MOUTH DAILY) 90 tablet 0  . polyethylene glycol (MIRALAX / GLYCOLAX) packet Take 17 g by mouth daily.    . verapamil (CALAN-SR) 240 MG CR tablet Take 1 tablet (240 mg total) by mouth daily. 30 tablet 0   No current facility-administered medications for this visit.     PHYSICAL EXAMINATION: ECOG PERFORMANCE STATUS: 2 - Symptomatic, <50% confined to bed  Vitals:   09/21/18 1053  BP: 134/69  Pulse: 90  Resp: 18  Temp: 98.2 F (36.8 C)  SpO2: 100%   Filed Weights   09/21/18 1053  Weight: 215 lb 9.6 oz (97.8 kg)    GENERAL:alert, no distress and comfortable SKIN: skin color, texture, turgor are normal, no rashes or significant lesions EYES: normal, Conjunctiva are pink and non-injected, sclera clear OROPHARYNX:no exudate, no erythema and lips, buccal mucosa, and tongue normal  NECK: supple, thyroid normal  size, non-tender, without nodularity LYMPH:  no palpable lymphadenopathy in the cervical, axillary or inguinal LUNGS: clear to auscultation and percussion with normal breathing effort HEART: Irregular rate and rhythm, no murmurs and no lower extremity edema ABDOMEN:abdomen soft, non-tender and normal bowel sounds Musculoskeletal:no cyanosis of digits and no clubbing  NEURO: alert & oriented x 3 with dysarthria, right-sided weakness from prior stroke LABORATORY DATA:  I have reviewed the data as listed    Component Value Date/Time   NA 140 09/21/2018 1007   NA 142 12/08/2017 1051   K 3.9 09/21/2018 1007   K 4.4 12/08/2017 1051   CL 105 09/21/2018 1007   CL 105 06/14/2017 1430   CO2 26 09/21/2018 1007   CO2 28 12/08/2017 1051   GLUCOSE 101 (H) 09/21/2018 1007   GLUCOSE 97 12/08/2017 1051   BUN 14 09/21/2018 1007   BUN 16.2 12/08/2017 1051   CREATININE 1.02 (H) 09/21/2018 1007   CREATININE 0.9 12/08/2017 1051  CALCIUM 9.9 09/21/2018 1007   CALCIUM 9.5 12/08/2017 1051   PROT 7.6 09/21/2018 1007   PROT 6.9 12/08/2017 1051   ALBUMIN 3.9 09/21/2018 1007   ALBUMIN 3.6 12/08/2017 1051   AST 18 09/21/2018 1007   AST 18 12/08/2017 1051   ALT 16 09/21/2018 1007   ALT 20 12/08/2017 1051   ALKPHOS 137 (H) 09/21/2018 1007   ALKPHOS 131 12/08/2017 1051   BILITOT 1.0 09/21/2018 1007   BILITOT 0.70 12/08/2017 1051   GFRNONAA 55 (L) 09/21/2018 1007   GFRAA >60 09/21/2018 1007    No results found for: SPEP, UPEP  Lab Results  Component Value Date   WBC 4.3 09/21/2018   NEUTROABS 2.2 09/21/2018   HGB 10.7 (L) 09/21/2018   HCT 31.2 (L) 09/21/2018   MCV 119.1 (H) 09/21/2018   PLT 164 09/21/2018      Chemistry      Component Value Date/Time   NA 140 09/21/2018 1007   NA 142 12/08/2017 1051   K 3.9 09/21/2018 1007   K 4.4 12/08/2017 1051   CL 105 09/21/2018 1007   CL 105 06/14/2017 1430   CO2 26 09/21/2018 1007   CO2 28 12/08/2017 1051   BUN 14 09/21/2018 1007   BUN 16.2  12/08/2017 1051   CREATININE 1.02 (H) 09/21/2018 1007   CREATININE 0.9 12/08/2017 1051      Component Value Date/Time   CALCIUM 9.9 09/21/2018 1007   CALCIUM 9.5 12/08/2017 1051   ALKPHOS 137 (H) 09/21/2018 1007   ALKPHOS 131 12/08/2017 1051   AST 18 09/21/2018 1007   AST 18 12/08/2017 1051   ALT 16 09/21/2018 1007   ALT 20 12/08/2017 1051   BILITOT 1.0 09/21/2018 1007   BILITOT 0.70 12/08/2017 1051       All questions were answered. The patient knows to call the clinic with any problems, questions or concerns. No barriers to learning was detected.  I spent 15 minutes counseling the patient face to face. The total time spent in the appointment was 20 minutes and more than 50% was on counseling and review of test results  Heath Lark, MD 09/21/2018 2:35 PM

## 2018-09-21 NOTE — Assessment & Plan Note (Signed)
She has chronic anemia, likely due to side effects of treatment She is not symptomatic Observe only. 

## 2018-09-22 LAB — CA 125: Cancer Antigen (CA) 125: 10.3 U/mL (ref 0.0–38.1)

## 2018-10-01 ENCOUNTER — Other Ambulatory Visit: Payer: Self-pay | Admitting: Hematology and Oncology

## 2018-10-06 ENCOUNTER — Telehealth: Payer: Self-pay | Admitting: *Deleted

## 2018-10-06 ENCOUNTER — Other Ambulatory Visit: Payer: Self-pay | Admitting: Hematology and Oncology

## 2018-10-06 DIAGNOSIS — C561 Malignant neoplasm of right ovary: Secondary | ICD-10-CM

## 2018-10-06 DIAGNOSIS — C563 Malignant neoplasm of bilateral ovaries: Secondary | ICD-10-CM

## 2018-10-06 DIAGNOSIS — C562 Malignant neoplasm of left ovary: Principal | ICD-10-CM

## 2018-10-06 NOTE — Telephone Encounter (Signed)
"  Brenda Cunningham daughter [place]ac[place] [phone]) voicemail "mom having trouble swallowing the Zejula.  It's so big.  She coughs about an hour and still feels it in her throat several hours after coughing."  Returned call.    "Trouble swallowing Zejula started a month ago has progressed to trouble with all pills, period.    Don't know when she started opening and crushing pills in apple sauce but this doesn't help.    Not really a cough.  She's trying to clear throat due feeling a tickle sensation that continues for several hours.  Threw up once trying to clear throat.    Saw PCP about swallowing.  Scheduled 1:00 pm, 10-13-2018 to see ENT in [place].  PCP says it's okay to crush her medications.    Eating and drinking well.  Drinks twelve, 12 oz water bottles daily.  Her problem is just with the pills.    Says her walking is a little worse but looks the same, walking well with and without her walker.  Denies face, arm, speech or balance problems.    No longer uses medications to reduce stomach acids."  Advised Eliquis may be crushed.  Scored Metoprolol may be halved.  ALL other medications may not be crushed, opened or chewed.  Keep ENT appointment, monitor symptoms, report to ED quickly with any changes.

## 2018-10-07 ENCOUNTER — Telehealth: Payer: Self-pay | Admitting: Pharmacist

## 2018-10-07 MED FILL — ZEJULA 100 MG CAPS: 100 | 30 days supply | Qty: 90 | Fill #0

## 2018-10-07 NOTE — Telephone Encounter (Signed)
Oral Oncology Pharmacist Encounter  Received question from Dr. Simeon Craft such as collaborative practice RN, Harrel Lemon, with questions about Zejula administration. Patient is currently having significant dysphasia and is unable to swallow many pills at a time. Patient's daughter had questioned whether patient would be able to take her Zejula at 1 capsule 3 times daily as opposed to 3 capsules at one time daily. Efficacy of Zejula appears to be directly correlated to a maximum concentration that is experienced after bolus dosing once daily. Due to this, administration of 1 capsule 3 times daily would not result in high enough concentrations of the Zejula to maintain clinical efficacy if patient were to change administration to this frequency. Patient should be instructed to try to administer her Zejula at the same time once daily trying to get all 3 of her capsules in within 15 to 20 minutes of each other.  Hassan Rowan will call and update patient's daughter with above information.  Johny Drilling, PharmD, BCPS, BCOP  10/07/2018 2:35 PM Oral Oncology Clinic [phone]

## 2018-10-07 NOTE — Telephone Encounter (Signed)
Daughter called and left a message to call her. She is asking if it is okay to take Zejula one tablet in the am, afternoon and at night?  Called back. Per Johny Drilling, pharmacist she should take all 3 tablets over 15 to 20 minutes to get the benefits of Zejula. Daughter verbalized understanding. Instructed to call office if needed.

## 2018-10-30 ENCOUNTER — Other Ambulatory Visit: Payer: Self-pay | Admitting: Hematology and Oncology

## 2018-11-02 ENCOUNTER — Inpatient Hospital Stay: Payer: Medicare Other | Attending: Hematology and Oncology

## 2018-11-02 ENCOUNTER — Inpatient Hospital Stay: Payer: Medicare Other

## 2018-11-02 DIAGNOSIS — C562 Malignant neoplasm of left ovary: Secondary | ICD-10-CM | POA: Insufficient documentation

## 2018-11-02 DIAGNOSIS — Z95828 Presence of other vascular implants and grafts: Secondary | ICD-10-CM

## 2018-11-02 DIAGNOSIS — C563 Malignant neoplasm of bilateral ovaries: Secondary | ICD-10-CM

## 2018-11-02 DIAGNOSIS — C561 Malignant neoplasm of right ovary: Secondary | ICD-10-CM

## 2018-11-02 LAB — CBC WITH DIFFERENTIAL/PLATELET
Abs Immature Granulocytes: 0.02 10*3/uL (ref 0.00–0.07)
Basophils Absolute: 0 10*3/uL (ref 0.0–0.1)
Basophils Relative: 1 %
Eosinophils Absolute: 0 10*3/uL (ref 0.0–0.5)
Eosinophils Relative: 1 %
HCT: 29.2 % — ABNORMAL LOW (ref 36.0–46.0)
Hemoglobin: 10.3 g/dL — ABNORMAL LOW (ref 12.0–15.0)
Immature Granulocytes: 0 %
Lymphocytes Relative: 37 %
Lymphs Abs: 1.9 10*3/uL (ref 0.7–4.0)
MCH: 41.9 pg — ABNORMAL HIGH (ref 26.0–34.0)
MCHC: 35.3 g/dL (ref 30.0–36.0)
MCV: 118.7 fL — ABNORMAL HIGH (ref 80.0–100.0)
Monocytes Absolute: 0.5 10*3/uL (ref 0.1–1.0)
Monocytes Relative: 10 %
Neutro Abs: 2.7 10*3/uL (ref 1.7–7.7)
Neutrophils Relative %: 51 %
Platelets: 83 10*3/uL — ABNORMAL LOW (ref 150–400)
RBC: 2.46 MIL/uL — ABNORMAL LOW (ref 3.87–5.11)
RDW: 17.2 % — ABNORMAL HIGH (ref 11.5–15.5)
WBC: 5.1 10*3/uL (ref 4.0–10.5)
nRBC: 1.8 % — ABNORMAL HIGH (ref 0.0–0.2)

## 2018-11-02 LAB — COMPREHENSIVE METABOLIC PANEL
ALT: 13 U/L (ref 0–44)
AST: 15 U/L (ref 15–41)
Albumin: 4 g/dL (ref 3.5–5.0)
Alkaline Phosphatase: 133 U/L — ABNORMAL HIGH (ref 38–126)
Anion gap: 13 (ref 5–15)
BUN: 12 mg/dL (ref 8–23)
CO2: 24 mmol/L (ref 22–32)
Calcium: 9.8 mg/dL (ref 8.9–10.3)
Chloride: 105 mmol/L (ref 98–111)
Creatinine, Ser: 1.01 mg/dL — ABNORMAL HIGH (ref 0.44–1.00)
GFR calc Af Amer: >60 mL/min (ref >60)
GFR calc non Af Amer: 57 mL/min — ABNORMAL LOW (ref >60)
Glucose, Bld: 118 mg/dL — ABNORMAL HIGH (ref 70–99)
Potassium: 3.5 mmol/L (ref 3.5–5.1)
Sodium: 142 mmol/L (ref 135–145)
Total Bilirubin: 1.2 mg/dL (ref 0.3–1.2)
Total Protein: 7.7 g/dL (ref 6.5–8.1)

## 2018-11-02 MED ORDER — SODIUM CHLORIDE 0.9% FLUSH
10.0000 mL | Freq: Once | INTRAVENOUS | Status: AC
  Administered 2018-11-02: 10 mL
  Filled 2018-11-02: qty 10

## 2018-11-02 MED ORDER — HEPARIN SOD (PORK) LOCK FLUSH 100 UNIT/ML IV SOLN
250.0000 [IU] | Freq: Once | INTRAVENOUS | Status: AC
  Administered 2018-11-02: 250 [IU]
  Filled 2018-11-02: qty 5

## 2018-11-02 MED FILL — ZEJULA 100 MG CAPS: 100 | 30 days supply | Qty: 90 | Fill #1

## 2018-11-03 LAB — CA 125: Cancer Antigen (CA) 125: 11 U/mL (ref 0.0–38.1)

## 2018-11-07 ENCOUNTER — Telehealth: Payer: Self-pay | Admitting: *Deleted

## 2018-11-07 NOTE — Telephone Encounter (Signed)
Daughter notified of message below. Verbalized understanding. Can bring her on Friday at 1000. Message to scheduler

## 2018-11-07 NOTE — Telephone Encounter (Signed)
-----   Message from Heath Lark, MD sent at 11/07/2018  6:45 AM EST ----- Regarding: labs last week Labs last week showed low platelet count She needs to hold zejula and come back for recheck before resuming She wants it with port Please call daughter to schedule. She has dysarthria due to stroke

## 2018-11-11 ENCOUNTER — Inpatient Hospital Stay: Payer: Medicare Other

## 2018-11-11 ENCOUNTER — Inpatient Hospital Stay: Payer: Medicare Other | Attending: Hematology and Oncology

## 2018-11-11 ENCOUNTER — Telehealth: Payer: Self-pay

## 2018-11-11 VITALS — BP 128/76 | HR 77 | Temp 98.3°F | Resp 17

## 2018-11-11 DIAGNOSIS — C562 Malignant neoplasm of left ovary: Secondary | ICD-10-CM

## 2018-11-11 DIAGNOSIS — C563 Malignant neoplasm of bilateral ovaries: Secondary | ICD-10-CM

## 2018-11-11 DIAGNOSIS — Z95828 Presence of other vascular implants and grafts: Secondary | ICD-10-CM

## 2018-11-11 DIAGNOSIS — C561 Malignant neoplasm of right ovary: Secondary | ICD-10-CM

## 2018-11-11 LAB — CBC WITH DIFFERENTIAL/PLATELET
Abs Immature Granulocytes: 0.02 10*3/uL (ref 0.00–0.07)
Basophils Absolute: 0 10*3/uL (ref 0.0–0.1)
Basophils Relative: 1 %
Eosinophils Absolute: 0.1 10*3/uL (ref 0.0–0.5)
Eosinophils Relative: 2 %
HCT: 26 % — ABNORMAL LOW (ref 36.0–46.0)
Hemoglobin: 8.9 g/dL — ABNORMAL LOW (ref 12.0–15.0)
Immature Granulocytes: 0 %
Lymphocytes Relative: 34 %
Lymphs Abs: 1.6 10*3/uL (ref 0.7–4.0)
MCH: 41.8 pg — ABNORMAL HIGH (ref 26.0–34.0)
MCHC: 34.2 g/dL (ref 30.0–36.0)
MCV: 122.1 fL — ABNORMAL HIGH (ref 80.0–100.0)
Monocytes Absolute: 0.5 10*3/uL (ref 0.1–1.0)
Monocytes Relative: 10 %
Neutro Abs: 2.6 10*3/uL (ref 1.7–7.7)
Neutrophils Relative %: 53 %
Platelets: 94 10*3/uL — ABNORMAL LOW (ref 150–400)
RBC: 2.13 MIL/uL — ABNORMAL LOW (ref 3.87–5.11)
RDW: 18.5 % — ABNORMAL HIGH (ref 11.5–15.5)
WBC: 4.8 10*3/uL (ref 4.0–10.5)
nRBC: 2.9 % — ABNORMAL HIGH (ref 0.0–0.2)

## 2018-11-11 LAB — COMPREHENSIVE METABOLIC PANEL
ALT: 15 U/L (ref 0–44)
AST: 16 U/L (ref 15–41)
Albumin: 3.7 g/dL (ref 3.5–5.0)
Alkaline Phosphatase: 139 U/L — ABNORMAL HIGH (ref 38–126)
Anion gap: 13 (ref 5–15)
BUN: 12 mg/dL (ref 8–23)
CO2: 24 mmol/L (ref 22–32)
Calcium: 9.3 mg/dL (ref 8.9–10.3)
Chloride: 104 mmol/L (ref 98–111)
Creatinine, Ser: 0.97 mg/dL (ref 0.44–1.00)
GFR calc Af Amer: >60 mL/min (ref >60)
GFR calc non Af Amer: 60 mL/min — ABNORMAL LOW (ref >60)
Glucose, Bld: 117 mg/dL — ABNORMAL HIGH (ref 70–99)
Potassium: 3.5 mmol/L (ref 3.5–5.1)
Sodium: 141 mmol/L (ref 135–145)
Total Bilirubin: 0.8 mg/dL (ref 0.3–1.2)
Total Protein: 7.1 g/dL (ref 6.5–8.1)

## 2018-11-11 MED ORDER — SODIUM CHLORIDE 0.9% FLUSH
10.0000 mL | Freq: Once | INTRAVENOUS | Status: AC
  Administered 2018-11-11: 10 mL
  Filled 2018-11-11: qty 10

## 2018-11-11 MED ORDER — HEPARIN SOD (PORK) LOCK FLUSH 100 UNIT/ML IV SOLN
500.0000 [IU] | Freq: Once | INTRAVENOUS | Status: AC
  Administered 2018-11-11: 500 [IU]
  Filled 2018-11-11: qty 5

## 2018-11-11 NOTE — Telephone Encounter (Signed)
-----   Message from Heath Lark, MD sent at 11/11/2018 10:47 AM EST ----- Regarding: labs Tell her to hold Zejula Labs to be rechecked in 1 week Do not restart until I see her labs ----- Message ----- From: Buel Ream, [facility] [place] Sent: 11/11/2018  10:46 AM EST To: Heath Lark, MD

## 2018-11-11 NOTE — Telephone Encounter (Signed)
Called and left a message asking daughter to call the office. 

## 2018-11-11 NOTE — Patient Instructions (Signed)
Implanted Port Home Guide An implanted port is a type of central line that is placed under the skin. Central lines are used to provide IV access when treatment or nutrition needs to be given through a person's veins. Implanted ports are used for long-term IV access. An implanted port may be placed because:  You need IV medicine that would be irritating to the small veins in your hands or arms.  You need long-term IV medicines, such as antibiotics.  You need IV nutrition for a long period.  You need frequent blood draws for lab tests.  You need dialysis.  Implanted ports are usually placed in the chest area, but they can also be placed in the upper arm, the abdomen, or the leg. An implanted port has two main parts:  Reservoir. The reservoir is round and will appear as a small, raised area under your skin. The reservoir is the part where a needle is inserted to give medicines or draw blood.  Catheter. The catheter is a thin, flexible tube that extends from the reservoir. The catheter is placed into a large vein. Medicine that is inserted into the reservoir goes into the catheter and then into the vein.  How will I care for my incision site? Do not get the incision site wet. Bathe or shower as directed by your health care provider. How is my port accessed? Special steps must be taken to access the port:  Before the port is accessed, a numbing cream can be placed on the skin. This helps numb the skin over the port site.  Your health care provider uses a sterile technique to access the port. ? Your health care provider must put on a mask and sterile gloves. ? The skin over your port is cleaned carefully with an antiseptic and allowed to dry. ? The port is gently pinched between sterile gloves, and a needle is inserted into the port.  Only "non-coring" port needles should be used to access the port. Once the port is accessed, a blood return should be checked. This helps ensure that the port  is in the vein and is not clogged.  If your port needs to remain accessed for a constant infusion, a clear (transparent) bandage will be placed over the needle site. The bandage and needle will need to be changed every week, or as directed by your health care provider.  Keep the bandage covering the needle clean and dry. Do not get it wet. Follow your health care provider's instructions on how to take a shower or bath while the port is accessed.  If your port does not need to stay accessed, no bandage is needed over the port.  What is flushing? Flushing helps keep the port from getting clogged. Follow your health care provider's instructions on how and when to flush the port. Ports are usually flushed with saline solution or a medicine called heparin. The need for flushing will depend on how the port is used.  If the port is used for intermittent medicines or blood draws, the port will need to be flushed: ? After medicines have been given. ? After blood has been drawn. ? As part of routine maintenance.  If a constant infusion is running, the port may not need to be flushed.  How long will my port stay implanted? The port can stay in for as long as your health care provider thinks it is needed. When it is time for the port to come out, surgery will be   done to remove it. The procedure is similar to the one performed when the port was put in. When should I seek immediate medical care? When you have an implanted port, you should seek immediate medical care if:  You notice a bad smell coming from the incision site.  You have swelling, redness, or drainage at the incision site.  You have more swelling or pain at the port site or the surrounding area.  You have a fever that is not controlled with medicine.  This information is not intended to replace advice given to you by your health care provider. Make sure you discuss any questions you have with your health care provider. Document  Released: 11/23/2005 Document Revised: 04/30/2016 Document Reviewed: 07/31/2013 Elsevier Interactive Patient Education  2017 [employer]  

## 2018-11-11 NOTE — Telephone Encounter (Signed)
Called and left another message

## 2018-11-11 NOTE — Telephone Encounter (Signed)
Called and given below message to daughter. She verbalized understanding. 

## 2018-11-12 LAB — CA 125: Cancer Antigen (CA) 125: 10.5 U/mL (ref 0.0–38.1)

## 2018-11-15 ENCOUNTER — Telehealth: Payer: Self-pay | Admitting: Hematology and Oncology

## 2018-11-15 NOTE — Telephone Encounter (Signed)
Scheduled appt per 12/06 sch message - pt daughter is aware of appt date and time

## 2018-11-18 ENCOUNTER — Inpatient Hospital Stay: Payer: Medicare Other

## 2018-11-18 DIAGNOSIS — C562 Malignant neoplasm of left ovary: Secondary | ICD-10-CM

## 2018-11-18 DIAGNOSIS — Z95828 Presence of other vascular implants and grafts: Secondary | ICD-10-CM

## 2018-11-18 DIAGNOSIS — C563 Malignant neoplasm of bilateral ovaries: Secondary | ICD-10-CM

## 2018-11-18 DIAGNOSIS — C561 Malignant neoplasm of right ovary: Secondary | ICD-10-CM

## 2018-11-18 LAB — CBC WITH DIFFERENTIAL/PLATELET
Abs Immature Granulocytes: 0.02 10*3/uL (ref 0.00–0.07)
Basophils Absolute: 0 10*3/uL (ref 0.0–0.1)
Basophils Relative: 0 %
Eosinophils Absolute: 0.1 10*3/uL (ref 0.0–0.5)
Eosinophils Relative: 1 %
HCT: 26.3 % — ABNORMAL LOW (ref 36.0–46.0)
Hemoglobin: 8.9 g/dL — ABNORMAL LOW (ref 12.0–15.0)
Immature Granulocytes: 0 %
Lymphocytes Relative: 24 %
Lymphs Abs: 1.2 10*3/uL (ref 0.7–4.0)
MCH: 41.6 pg — ABNORMAL HIGH (ref 26.0–34.0)
MCHC: 33.8 g/dL (ref 30.0–36.0)
MCV: 122.9 fL — ABNORMAL HIGH (ref 80.0–100.0)
Monocytes Absolute: 0.5 10*3/uL (ref 0.1–1.0)
Monocytes Relative: 11 %
Neutro Abs: 3 10*3/uL (ref 1.7–7.7)
Neutrophils Relative %: 64 %
Platelets: 191 10*3/uL (ref 150–400)
RBC: 2.14 MIL/uL — ABNORMAL LOW (ref 3.87–5.11)
RDW: 19.2 % — ABNORMAL HIGH (ref 11.5–15.5)
WBC: 4.8 10*3/uL (ref 4.0–10.5)
nRBC: 2.9 % — ABNORMAL HIGH (ref 0.0–0.2)

## 2018-11-18 LAB — COMPREHENSIVE METABOLIC PANEL
ALT: 13 U/L (ref 0–44)
AST: 14 U/L — ABNORMAL LOW (ref 15–41)
Albumin: 3.6 g/dL (ref 3.5–5.0)
Alkaline Phosphatase: 134 U/L — ABNORMAL HIGH (ref 38–126)
Anion gap: 10 (ref 5–15)
BUN: 12 mg/dL (ref 8–23)
CO2: 26 mmol/L (ref 22–32)
Calcium: 9.5 mg/dL (ref 8.9–10.3)
Chloride: 106 mmol/L (ref 98–111)
Creatinine, Ser: 0.87 mg/dL (ref 0.44–1.00)
GFR calc Af Amer: >60 mL/min (ref >60)
GFR calc non Af Amer: >60 mL/min (ref >60)
Glucose, Bld: 119 mg/dL — ABNORMAL HIGH (ref 70–99)
Potassium: 3.5 mmol/L (ref 3.5–5.1)
Sodium: 142 mmol/L (ref 135–145)
Total Bilirubin: 0.8 mg/dL (ref 0.3–1.2)
Total Protein: 6.9 g/dL (ref 6.5–8.1)

## 2018-11-18 MED ORDER — HEPARIN SOD (PORK) LOCK FLUSH 100 UNIT/ML IV SOLN
250.0000 [IU] | Freq: Once | INTRAVENOUS | Status: AC
  Administered 2018-11-18: 250 [IU]
  Filled 2018-11-18: qty 5

## 2018-11-18 MED ORDER — SODIUM CHLORIDE 0.9% FLUSH
10.0000 mL | Freq: Once | INTRAVENOUS | Status: AC
  Administered 2018-11-18: 10 mL
  Filled 2018-11-18: qty 10

## 2018-11-19 LAB — CA 125: Cancer Antigen (CA) 125: 10.1 U/mL (ref 0.0–38.1)

## 2018-11-21 ENCOUNTER — Telehealth: Payer: Self-pay

## 2018-11-21 NOTE — Telephone Encounter (Signed)
Called and given below message. Daughter verbalized understanding. 

## 2018-11-21 NOTE — Telephone Encounter (Signed)
-----   Message from Heath Lark, MD sent at 11/21/2018  9:03 AM EST ----- Regarding: labs are ok Let her know labs are ok OK to resume [place] ----- Message ----- From: Buel Ream, Lab In [place] Sent: 11/18/2018  10:49 AM EST To: Heath Lark, MD

## 2018-12-06 MED FILL — ZEJULA 100 MG CAPS: 100 | 30 days supply | Qty: 90 | Fill #2

## 2018-12-08 ENCOUNTER — Encounter (HOSPITAL_COMMUNITY): Payer: Self-pay | Admitting: Emergency Medicine

## 2018-12-08 ENCOUNTER — Emergency Department (HOSPITAL_COMMUNITY): Payer: Medicare Other

## 2018-12-08 ENCOUNTER — Emergency Department (HOSPITAL_COMMUNITY)
Admission: EM | Admit: 2018-12-08 | Discharge: 2018-12-08 | Disposition: A | Discharge status: Home / Self Care | Payer: Medicare Other | Attending: Emergency Medicine | Admitting: Emergency Medicine

## 2018-12-08 DIAGNOSIS — Z7982 Long term (current) use of aspirin: Secondary | ICD-10-CM | POA: Diagnosis not present

## 2018-12-08 DIAGNOSIS — I10 Essential (primary) hypertension: Secondary | ICD-10-CM | POA: Diagnosis not present

## 2018-12-08 DIAGNOSIS — J069 Acute upper respiratory infection, unspecified: Secondary | ICD-10-CM | POA: Diagnosis not present

## 2018-12-08 DIAGNOSIS — Z79899 Other long term (current) drug therapy: Secondary | ICD-10-CM | POA: Diagnosis not present

## 2018-12-08 DIAGNOSIS — Z91040 Latex allergy status: Secondary | ICD-10-CM | POA: Insufficient documentation

## 2018-12-08 DIAGNOSIS — Z853 Personal history of malignant neoplasm of breast: Secondary | ICD-10-CM | POA: Insufficient documentation

## 2018-12-08 DIAGNOSIS — B9789 Other viral agents as the cause of diseases classified elsewhere: Secondary | ICD-10-CM

## 2018-12-08 DIAGNOSIS — R05 Cough: Secondary | ICD-10-CM | POA: Diagnosis present

## 2018-12-08 LAB — BASIC METABOLIC PANEL
Anion gap: 9 (ref 5–15)
BUN: 11 mg/dL (ref 8–23)
CO2: 26 mmol/L (ref 22–32)
Calcium: 9.2 mg/dL (ref 8.9–10.3)
Chloride: 104 mmol/L (ref 98–111)
Creatinine, Ser: 0.86 mg/dL (ref 0.44–1.00)
GFR calc Af Amer: >60 mL/min (ref >60)
GFR calc non Af Amer: >60 mL/min (ref >60)
Glucose, Bld: 111 mg/dL — ABNORMAL HIGH (ref 70–99)
Potassium: 3.5 mmol/L (ref 3.5–5.1)
Sodium: 139 mmol/L (ref 135–145)

## 2018-12-08 LAB — CBC WITH DIFFERENTIAL/PLATELET
Abs Immature Granulocytes: 0.05 10*3/uL (ref 0.00–0.07)
Basophils Absolute: 0 10*3/uL (ref 0.0–0.1)
Basophils Relative: 0 %
Eosinophils Absolute: 0.1 10*3/uL (ref 0.0–0.5)
Eosinophils Relative: 1 %
HCT: 28.4 % — ABNORMAL LOW (ref 36.0–46.0)
Hemoglobin: 9.5 g/dL — ABNORMAL LOW (ref 12.0–15.0)
Immature Granulocytes: 1 %
Lymphocytes Relative: 9 %
Lymphs Abs: 0.8 10*3/uL (ref 0.7–4.0)
MCH: 41.3 pg — ABNORMAL HIGH (ref 26.0–34.0)
MCHC: 33.5 g/dL (ref 30.0–36.0)
MCV: 123.5 fL — ABNORMAL HIGH (ref 80.0–100.0)
Monocytes Absolute: 0.7 10*3/uL (ref 0.1–1.0)
Monocytes Relative: 8 %
Neutro Abs: 7.2 10*3/uL (ref 1.7–7.7)
Neutrophils Relative %: 81 %
Platelets: 177 10*3/uL (ref 150–400)
RBC: 2.3 MIL/uL — ABNORMAL LOW (ref 3.87–5.11)
RDW: 17.9 % — ABNORMAL HIGH (ref 11.5–15.5)
WBC: 8.9 10*3/uL (ref 4.0–10.5)
nRBC: 1.7 % — ABNORMAL HIGH (ref 0.0–0.2)

## 2018-12-08 NOTE — ED Triage Notes (Signed)
Pt c/o productive cough for over week. Reports sputum is yellow.

## 2018-12-08 NOTE — ED Provider Notes (Signed)
[facility] DEPT Provider Note   CSN: [id number] Arrival date & time: 12/08/18  4742     History   Chief Complaint Chief Complaint  Patient presents with  . Cough    HPI Brenda Cunningham is a 68 y.o. female.  HPI   She complains of "cold," characterized by cough productive of yellow sputum for several days.  She denies fever, chills, shortness of breath, weakness, dizziness.  She has chronic right-sided hemiplegia from stroke.  She is being treated for breast cancer with oral chemotherapeutic agent.  Complains of a "black tongue," for several months.  There are no other known modifying factors.  Past Medical History:  Diagnosis Date  . A-fib ([place])   . Anxiety   . Arthritis   . Breast cancer ([place]) 2008   s/p lumpectomy, chemo and radiation therapy  . Complication of anesthesia    DIFFICULTY WAKING UP FROM ANESTHESIA  . Hyperlipidemia   . Hypertension   . Personal history of chemotherapy   . Personal history of radiation therapy   . Stroke ([place]) 07/29/2015  . Wears glasses     Patient Active Problem List   Diagnosis Date Noted  . Deficiency anemia 05/19/2018  . History of stroke 04/04/2018  . Hemiparesis affecting right side as late effect of cerebrovascular accident ([place]) 04/04/2018  . Other constipation 01/17/2018  . Anemia, chronic disease 11/25/2017  . Goals of care, counseling/discussion 07/15/2017  . Genetic testing 08/17/2016  . Leukopenia due to antineoplastic chemotherapy ([place]) 06/05/2016  . Costochondritis, acute 05/02/2016  . Ovarian cancer ([place]) 03/24/2016  . Anemia due to antineoplastic chemotherapy 03/17/2016  . Port catheter in place 03/17/2016  . Portacath in place 02/05/2016  . Chemotherapy-induced peripheral neuropathy ([place]) 02/05/2016  . Hematoma 01/27/2016  . Chronic anticoagulation 01/22/2016  . Chronic atrial fibrillation 01/22/2016  . PICC (peripherally inserted central catheter) in place 01/22/2016  .  Chemotherapy induced neutropenia ([place]) 01/22/2016  . Poor venous access 01/22/2016  . CVA (cerebral vascular accident) ([place]) 12/27/2015  . Right hemiparesis ([place]) 12/27/2015  . Expressive dysphasia 12/27/2015  . History of left breast cancer 12/27/2015  . H/O tubal ligation 12/27/2015  . Colon polyps 12/27/2015  . [employer] Federation of Gyne[employer] and Obstetric[employer] (FIGO) stage IVB epithelial ovarian cancer ([place]) 12/27/2015  . Ovarian cancer, bilateral ([place]) 12/24/2015  . Inguinal adenopathy   . Carcinomatosis ([place]) 12/06/2015  . Essential hypertension, benign 07/18/2014  . A-fib ([place]) 07/18/2014  . Anxiety state, unspecified 07/18/2014    Past Surgical History:  Procedure Laterality Date  . APPENDECTOMY    . BREAST LUMPECTOMY  2008   left  . COLONOSCOPY  2004  . DEBULKING N/A 03/24/2016   Procedure: RADICAL TUMOR DEBULKING;  Surgeon: Everitt Amber, MD;  Location: [facility];  Service: Gynecology;  Laterality: N/A;  . OMENTECTOMY N/A 03/24/2016   Procedure: OMENTECTOMY;  Surgeon: Everitt Amber, MD;  Location: [facility];  Service: Gynecology;  Laterality: N/A;  . ROBOTIC ASSISTED TOTAL HYSTERECTOMY WITH BILATERAL SALPINGO OOPHERECTOMY Bilateral 03/24/2016   Procedure: XI ROBOTIC ASSISTED TOTA LAPAROSCOPIC  HYSTERECTOMY WITH BILATERAL SALPINGO OOPHORECTOMY;  Surgeon: Everitt Amber, MD;  Location: [facility];  Service: Gynecology;  Laterality: Bilateral;  . TUBAL LIGATION       OB History   No obstetric history on file.      Home Medications    Prior to Admission medications   Medication Sig Start Date End Date Taking? Authorizing Provider  apixaban (ELIQUIS) 5 MG TABS tablet Take 1  tablet (5 mg total) by mouth 2 (two) times daily. 09/11/15  Yes Tysinger, Camelia Eng, PA-C  aspirin EC 81 MG tablet Take 81 mg by mouth daily.   Yes [provider]  atorvastatin (LIPITOR) 40 MG tablet Take 40 mg by mouth at bedtime.   Yes [provider]  diphenhydrAMINE (BENADRYL) 25 MG tablet Take 25  mg by mouth every 6 (six) hours as needed for allergies.   Yes [provider]  gabapentin (NEURONTIN) 600 MG tablet Take 1 tablet (600 mg total) by mouth 2 (two) times daily. 08/31/18 12/08/18 Yes Gorsuch, Ni, MD  guaifenesin (ROBITUSSIN) 100 MG/5ML syrup Take 200 mg by mouth 3 (three) times daily as needed for cough.   Yes [provider]  lidocaine-prilocaine (EMLA) cream Apply to Porta-Cath 1-2 hours prior to access as directed. 09/14/18  Yes Gorsuch, Ni, MD  metoprolol succinate (TOPROL-XL) 25 MG 24 hr tablet Take 25 mg by mouth daily.  03/28/18  Yes [provider]  Multiple Vitamin (MULTIVITAMIN WITH MINERALS) TABS tablet Take 1 tablet by mouth daily.   Yes [provider]  omega-3 acid ethyl esters (LOVAZA) 1 g capsule Take 1 g by mouth daily.   Yes [provider]  oxyCODONE (OXY IR/ROXICODONE) 5 MG immediate release tablet Take 1 tablet (5 mg total) by mouth every 6 (six) hours as needed for severe pain. 09/21/18  Yes Gorsuch, Ni, MD  PARoxetine (PAXIL) 40 MG tablet Take 40 mg by mouth daily. 11/11/18  Yes [provider]  polyethylene glycol (MIRALAX / GLYCOLAX) packet Take 17 g by mouth daily.   Yes [provider]  verapamil (CALAN-SR) 240 MG CR tablet Take 1 tablet (240 mg total) by mouth daily. 07/25/15  Yes Tysinger, Camelia Eng, PA-C  ZEJULA 100 MG CAPS TAKE 3 CAPSULES (300MG ) BY MOUTH DAILY Patient taking differently: Take 300 mg by mouth daily.  10/06/18  Yes Nicholas Lose, MD  FLUAD 0.5 ML SUSY Inject 1 Dose into the skin once. 08/31/18   [provider]  gabapentin (NEURONTIN) 300 MG capsule TAKE ONE CAPSULE BY MOUTH TWICE DAILY Patient not taking: No sig reported 10/03/18   Heath Lark, MD  gabapentin (NEURONTIN) 300 MG capsule TAKE 1 CAPSULE(300 MG) BY MOUTH THREE TIMES DAILY Patient not taking: No sig reported 10/31/18   Nicholas Lose, MD  loratadine (CLARITIN) 10 MG tablet Take 10 mg by mouth daily as needed  (Recommended for chemo pain). Reported on 03/16/2016    [provider]  PARoxetine (PAXIL) 20 MG tablet TAKE 1 TABLET BY MOUTH DAILY Patient not taking: Reported on 12/08/2018 06/03/15   Tysinger, Camelia Eng, PA-C  verapamil (CALAN-SR) 180 MG CR tablet Take 180 mg by mouth daily. 10/03/18   [provider]  prochlorperazine (COMPAZINE) 10 MG tablet Take 1 tablet (10 mg total) by mouth every 6 (six) hours as needed (Nausea or vomiting). 07/13/17 01/17/18  Heath Lark, MD    Family History Family History  Problem Relation Age of Onset  . Cirrhosis Father        +heavy EtOH  . Diabetes Maternal Uncle   . Other Daughter        hx benign breast bx  . Breast cancer Neg Hx     Social History Social History   Tobacco Use  . Smoking status: Never Smoker  . Smokeless tobacco: Never Used  Substance Use Topics  . Alcohol use: Yes    Comment: occ beer or glass of wine  .  Drug use: No     Allergies   Codeine; Lactulose; and Latex   Review of Systems Review of Systems  All other systems reviewed and are negative.    Physical Exam Updated Vital Signs BP 129/73 (BP Location: Left Arm)   Pulse 81   Temp 98.2 F (36.8 C) (Oral)   Resp 19   SpO2 100%   Physical Exam Vitals signs and nursing note reviewed.  Constitutional:      General: She is not in acute distress.    Appearance: She is well-developed. She is not ill-appearing, toxic-appearing or diaphoretic.  HENT:     Head: Normocephalic and atraumatic.     Right Ear: External ear normal.     Left Ear: External ear normal.  Eyes:     Conjunctiva/sclera: Conjunctivae normal.     Pupils: Pupils are equal, round, and reactive to light.  Neck:     Musculoskeletal: Normal range of motion and neck supple.     Trachea: Phonation normal.  Cardiovascular:     Rate and Rhythm: Normal rate and regular rhythm.     Heart sounds: Normal heart sounds.  Pulmonary:     Effort: Pulmonary effort is normal. No respiratory  distress.     Breath sounds: Normal breath sounds. No stridor. No wheezing or rhonchi.     Comments: Mild upper airway congestion noted on exam. Chest:     Chest wall: No tenderness.  Abdominal:     Palpations: Abdomen is soft.     Tenderness: There is no abdominal tenderness.  Musculoskeletal: Normal range of motion.  Skin:    General: Skin is warm and dry.  Neurological:     Mental Status: She is alert and oriented to person, place, and time.     Sensory: No sensory deficit.     Motor: No abnormal muscle tone.     Comments: Dysarthria and right hemiplegia secondary to prior stroke.  Psychiatric:        Mood and Affect: Mood normal.        Behavior: Behavior normal.        Thought Content: Thought content normal.        Judgment: Judgment normal.      ED Treatments / Results  Labs (all labs ordered are listed, but only abnormal results are displayed) Labs Reviewed  BASIC METABOLIC PANEL - Abnormal; Notable for the following components:      Result Value   Glucose, Bld 111 (*)    All other components within normal limits  CBC WITH DIFFERENTIAL/PLATELET - Abnormal; Notable for the following components:   RBC 2.30 (*)    Hemoglobin 9.5 (*)    HCT 28.4 (*)    MCV 123.5 (*)    MCH 41.3 (*)    RDW 17.9 (*)    nRBC 1.7 (*)    All other components within normal limits    EKG None  Radiology Dg Chest 2 View  Result Date: 12/08/2018 CLINICAL DATA:  Cough and congestion.  History of breast carcinoma EXAM: CHEST - 2 VIEW COMPARISON:  Chest radiograph Apr 30, 2016 and chest CT September 16, 2017 FINDINGS: Port-A-Cath tip is in the superior vena cava. No pneumothorax. There is scarring with volume loss in the right upper lobe near the apex, not felt to be appreciably changed. No new opacities are evident. There is no edema or consolidation. Heart is upper normal in size with pulmonary vascularity on the left normal and pulmonary vascularity on the  right somewhat distorted due to the  scarring in the right upper lobe region. No adenopathy is appreciable. Postoperative changes noted in the left breast region. No evident bone lesions. IMPRESSION: Stable scarring with retraction and volume loss right upper lobe toward the apex. Lungs elsewhere clear. Stable cardiac silhouette. No adenopathy evident. Port-A-Cath tip in superior vena cava. Postoperative change left breast. Electronically Signed   By: Lowella Grip III M.D.   On: 12/08/2018 09:28    Procedures Procedures (including critical care time)  Medications Ordered in ED Medications - No data to display   Initial Impression / Assessment and Plan / ED Course  I have reviewed the triage vital signs and the nursing notes.  Pertinent labs & imaging results that were available during my care of the patient were reviewed by me and considered in my medical decision making (see chart for details).  Clinical Course as of Dec 08 1110  Thu Dec 08, 2018  1107 Normal except hemoglobin low  CBC with Differential(!) [EW]  1107 Normal except glucose high  Basic metabolic panel(!) [EW]  6381 No infiltrate or CHF, images reviewed by me  DG Chest 2 View [EW]    Clinical Course User Index [EW] Daleen Bo, MD     Patient Vitals for the past 24 hrs:  BP Temp Temp src Pulse Resp SpO2  12/08/18 0857 129/73 98.2 F (36.8 C) Oral 81 19 100 %    11:06 AM Reevaluation with update and discussion. After initial assessment and treatment, an updated evaluation reveals she is comfortable has no further complaints.  Findings discussed with patient and family member, questions answered. Daleen Bo   Medical Decision Making: Evaluation consistent with nonspecific bronchial inflammatory process.  Suspect virus.  Doubt serious bacterial infection or impending vascular collapse.  CRITICAL CARE-no Performed by: Daleen Bo  Nursing Notes Reviewed/ Care Coordinated Applicable Imaging Reviewed Interpretation of Laboratory Data  incorporated into ED treatment  The patient appears reasonably screened and/or stabilized for discharge and I doubt any other medical condition or other [facility] requiring further screening, evaluation, or treatment in the ED at this time prior to discharge.  Plan: Home Medications-continue usual medication, use Robitussin-DM for cough.; Home Treatments-rest, fluids; return here if the recommended treatment, does not improve the symptoms; Recommended follow up-PCP, PRN   Final Clinical Impressions(s) / ED Diagnoses   Final diagnoses:  Viral URI with cough    ED Discharge Orders    None       Daleen Bo, MD 12/08/18 1112

## 2018-12-08 NOTE — Discharge Instructions (Addendum)
There is no sign of pneumonia at this time.  Make sure that you are getting plenty of rest, and drinking a lot of fluids.  You can use Robitussin-DM for cough.

## 2018-12-15 ENCOUNTER — Telehealth: Payer: Self-pay | Admitting: Hematology and Oncology

## 2018-12-15 ENCOUNTER — Inpatient Hospital Stay: Payer: Medicare Other | Attending: Hematology and Oncology

## 2018-12-15 ENCOUNTER — Encounter: Payer: Self-pay | Admitting: Hematology and Oncology

## 2018-12-15 ENCOUNTER — Inpatient Hospital Stay (HOSPITAL_BASED_OUTPATIENT_CLINIC_OR_DEPARTMENT_OTHER): Payer: Medicare Other | Admitting: Hematology and Oncology

## 2018-12-15 ENCOUNTER — Inpatient Hospital Stay: Payer: Medicare Other

## 2018-12-15 DIAGNOSIS — R1310 Dysphagia, unspecified: Secondary | ICD-10-CM | POA: Diagnosis not present

## 2018-12-15 DIAGNOSIS — Z79899 Other long term (current) drug therapy: Secondary | ICD-10-CM | POA: Insufficient documentation

## 2018-12-15 DIAGNOSIS — Z7982 Long term (current) use of aspirin: Secondary | ICD-10-CM | POA: Insufficient documentation

## 2018-12-15 DIAGNOSIS — I482 Chronic atrial fibrillation, unspecified: Secondary | ICD-10-CM | POA: Diagnosis not present

## 2018-12-15 DIAGNOSIS — J069 Acute upper respiratory infection, unspecified: Secondary | ICD-10-CM | POA: Diagnosis not present

## 2018-12-15 DIAGNOSIS — D61818 Other pancytopenia: Secondary | ICD-10-CM | POA: Diagnosis not present

## 2018-12-15 DIAGNOSIS — Z8543 Personal history of malignant neoplasm of ovary: Secondary | ICD-10-CM | POA: Insufficient documentation

## 2018-12-15 DIAGNOSIS — D638 Anemia in other chronic diseases classified elsewhere: Secondary | ICD-10-CM | POA: Diagnosis not present

## 2018-12-15 DIAGNOSIS — C563 Malignant neoplasm of bilateral ovaries: Secondary | ICD-10-CM

## 2018-12-15 DIAGNOSIS — C561 Malignant neoplasm of right ovary: Secondary | ICD-10-CM

## 2018-12-15 DIAGNOSIS — C562 Malignant neoplasm of left ovary: Secondary | ICD-10-CM

## 2018-12-15 DIAGNOSIS — R109 Unspecified abdominal pain: Secondary | ICD-10-CM | POA: Diagnosis not present

## 2018-12-15 DIAGNOSIS — Z9221 Personal history of antineoplastic chemotherapy: Secondary | ICD-10-CM | POA: Diagnosis not present

## 2018-12-15 DIAGNOSIS — K5909 Other constipation: Secondary | ICD-10-CM | POA: Insufficient documentation

## 2018-12-15 DIAGNOSIS — Z95828 Presence of other vascular implants and grafts: Secondary | ICD-10-CM

## 2018-12-15 LAB — COMPREHENSIVE METABOLIC PANEL
ALT: 62 U/L — ABNORMAL HIGH (ref 0–44)
AST: 57 U/L — ABNORMAL HIGH (ref 15–41)
Albumin: 3.1 g/dL — ABNORMAL LOW (ref 3.5–5.0)
Alkaline Phosphatase: 172 U/L — ABNORMAL HIGH (ref 38–126)
Anion gap: 10 (ref 5–15)
BUN: 14 mg/dL (ref 8–23)
CO2: 27 mmol/L (ref 22–32)
Calcium: 9.5 mg/dL (ref 8.9–10.3)
Chloride: 102 mmol/L (ref 98–111)
Creatinine, Ser: 0.91 mg/dL (ref 0.44–1.00)
GFR calc Af Amer: >60 mL/min (ref >60)
GFR calc non Af Amer: >60 mL/min (ref >60)
Glucose, Bld: 114 mg/dL — ABNORMAL HIGH (ref 70–99)
Potassium: 3.2 mmol/L — ABNORMAL LOW (ref 3.5–5.1)
Sodium: 139 mmol/L (ref 135–145)
Total Bilirubin: 1.2 mg/dL (ref 0.3–1.2)
Total Protein: 7.3 g/dL (ref 6.5–8.1)

## 2018-12-15 LAB — CBC WITH DIFFERENTIAL/PLATELET
Abs Immature Granulocytes: 0.05 10*3/uL (ref 0.00–0.07)
Basophils Absolute: 0 10*3/uL (ref 0.0–0.1)
Basophils Relative: 1 %
Eosinophils Absolute: 0.1 10*3/uL (ref 0.0–0.5)
Eosinophils Relative: 1 %
HCT: 24.7 % — ABNORMAL LOW (ref 36.0–46.0)
Hemoglobin: 8.5 g/dL — ABNORMAL LOW (ref 12.0–15.0)
Immature Granulocytes: 1 %
Lymphocytes Relative: 17 %
Lymphs Abs: 0.8 10*3/uL (ref 0.7–4.0)
MCH: 40.9 pg — ABNORMAL HIGH (ref 26.0–34.0)
MCHC: 34.4 g/dL (ref 30.0–36.0)
MCV: 118.8 fL — ABNORMAL HIGH (ref 80.0–100.0)
Monocytes Absolute: 0.4 10*3/uL (ref 0.1–1.0)
Monocytes Relative: 9 %
Neutro Abs: 3.3 10*3/uL (ref 1.7–7.7)
Neutrophils Relative %: 71 %
Platelets: 134 10*3/uL — ABNORMAL LOW (ref 150–400)
RBC: 2.08 MIL/uL — ABNORMAL LOW (ref 3.87–5.11)
RDW: 17.6 % — ABNORMAL HIGH (ref 11.5–15.5)
WBC: 4.6 10*3/uL (ref 4.0–10.5)
nRBC: 2.2 % — ABNORMAL HIGH (ref 0.0–0.2)

## 2018-12-15 MED ORDER — HEPARIN SOD (PORK) LOCK FLUSH 100 UNIT/ML IV SOLN
500.0000 [IU] | Freq: Once | INTRAVENOUS | Status: AC
  Administered 2018-12-15: 500 [IU]
  Filled 2018-12-15: qty 5

## 2018-12-15 MED ORDER — SODIUM CHLORIDE 0.9% FLUSH
10.0000 mL | Freq: Once | INTRAVENOUS | Status: AC
  Administered 2018-12-15: 10 mL
  Filled 2018-12-15: qty 10

## 2018-12-15 NOTE — Telephone Encounter (Signed)
Gave avs and calendar ° °

## 2018-12-15 NOTE — Progress Notes (Signed)
[place] OFFICE PROGRESS NOTE  Patient Care Team: Helane Rima, MD as PCP - General (Family Medicine) Encarnacion Slates, MD as Referring Physician (Neurology)  ASSESSMENT & PLAN:  Ovarian cancer [facility]) Last CT imaging showed no signs of cancer recurrence/progression She will continue with Niraparib indefinitely However, due to significant pancytopenia, I plan to put her treatment on hold for 1 week.  She will resume after that and I plan to see her again at the end of the month for further follow-up I plan to repeat imaging study again in March 2020  Pancytopenia, acquired [facility]) She has significant pancytopenia, likely exacerbated by recent infection I recommend holding treatment for 1 week and then resume I plan to see her again in 3 weeks for further follow-up At this point in time, she does not need transfusion support  Chronic atrial fibrillation ([place]) She is on chronic anticoagulation therapy She denies bleeding She will continue her treatment  Acute URI She has diagnosis of acute URI and currently is on antibiotics treatment I recommend putting her treatment on hold She will complete her antibiotic treatment   No orders of the defined types were placed in this encounter.   INTERVAL HISTORY: Please see below for problem oriented charting. She returns for further follow-up with her family 3 days ago, she was diagnosed with acute upper respiratory tract infection and she was placed on antibiotic treatment She complained of fatigue The patient denies any recent signs or symptoms of bleeding such as spontaneous epistaxis, hematuria or hematochezia. She denies abdominal bloating, nausea or changes in bowel habits  SUMMARY OF ONCOLOGIC HISTORY: Oncology History   Negative genetic testing     Ovarian cancer ([place])   11/28/2015 Imaging    Ct scan of abdomen: Peritoneal carcinomatosis and pelvic/inguinal adenopathy. Gynecologic primary is favored.    12/17/2015 Pathology Results    Lymph node, needle/core biopsy, L inguinal LAN - METASTATIC ADENOCARCINOMA. Microscopic Comment Immunohistochemistry will be performed and reported as an addendum. (JDP:ecj 12/18/2015) ADDENDUM: Immunohistochemistry shows strong positivity with cytokeratin 7, estrogen receptor (ER), progesterone receptor (PR), p53 and WT1. Negative markers are cytokeratin 20 , CDX- 2 and gross cystic disease fluid protein. 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 (JDP:kh 12-19-15    12/17/2015 Procedure    Technically successful ultrasound guided core left inguinal adenopathy biopsy    12/24/2015 Tumor Marker    Patient's tumor was tested for the following markers: CA-125 Results of the tumor marker test revealed 608.2    12/27/2015 - 02/28/2016 Chemotherapy    She received neoadjuvant chemo x 3 cycles    01/01/2016 Tumor Marker    Patient's tumor was tested for the following markers: CA-125 Results of the tumor marker test revealed 741.6    01/29/2016 Procedure    Successful placement of a right internal jugular approach power injectable Port-A-Cath. The catheter is ready for immediate use    01/29/2016 Imaging    US abdomen 1. Hyperechoic 2.5 x 1.0 x 1.6 cm lesion in the region the caudate lobe of the liver. Similar finding noted on prior CT of 11/28/2015. This could represent hemangioma. This could represent a malignancy including metastatic disease. 2. Exam otherwise unremarkable. No gallstones. No biliary distention.    02/03/2016 Tumor Marker    Patient's tumor was tested for the following markers: CA-125 Results of the tumor marker test revealed 137.1    02/20/2016 Imaging    MRI  brain No acute infarct.  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  No intracranial mass.  Moderate small vessel disease changes.  Global atrophy without  hydrocephalus.  Expanded partially empty sella without secondary findings of pseudotumor cerebri.  Minimal mucosal thickening ethmoid sinus air cells. Small air-fluid levels maxillary sinuses bilaterally may indicate changes of acute sinusitis.     02/28/2016 Tumor Marker    Patient's tumor was tested for the following markers: CA-125 Results of the tumor marker test revealed 33.6    03/02/2016 Imaging    CT chest, abdomen and pelvis 1. Today's study demonstrates a positive response to therapy with resolution of the previously noted malignant ascites, significant regression of previously noted peritoneal implants, and regression of previously noted lymphadenopathy in the abdomen and pelvis. 2. No definite evidence to suggest metastatic disease to the thorax. 3. Stable 1.0 x 1.7 cm intermediate attenuation lesion associated with the posterior aspect of segment 1 of the liver, favored to represent a mildly proteinaceous hepatic cyst. The possibility of a peritoneal implant in this region is not entirely excluded, but is not favored on today's examination. 4. Extensive post infectious scarring inguinal upper right lung, likely sequela of prior necrotizing pneumonia. 5. Multiple tiny pulmonary nodules scattered throughout the lungs bilaterally all measuring 4 mm or less. These are nonspecific, but favored to be benign. Given the patient's history of primary gynecologic malignancy, attention on followup studies is recommended to ensure the stability of these findings. 6. Additional incidental findings, as above    03/14/2016 Imaging    CT angiogram 1. No pulmonary emboli. 2. Chronic changes to the right upper lobe. 3. Stable lesion in the caudate lobe of the liver. 4. Stable soft tissue prominence to the right of the trachea as described above. 5. Stable small nodule in the right lung.     03/24/2016 Pathology Results    1. Omentum, resection for tumor - FOCAL ADENOCARCINOMA ASSOCIATED WITH  EXTENSIVE FIBROSIS, INFLAMMATION AND HEMOSIDERIN DEPOSITION. 2. Uterus +/- tubes/ovaries, neoplastic, cervix - CERVIX: SLIGHT CERVICITIS AND SQUAMOUS METAPLASIA. - ENDOMETRIUM: ATROPHIC, NO HYPERPLASIA OR MALIGNANCY. - MYOMETRIUM: LEIOMYOMATA WITH DEGENERATIVE CHANGES. NO MALIGNANCY. - UTERINE SEROSA: FOCAL ADENOCARCINOMA ASSOCIATED WITH ADHESIONS. - RIGHT OVARY: BENIGN CALCIFIED NODULE AND BENIGN SEROUS CYST. NO MALIGNANCY IDENTIFIED. - RIGHT FALLOPIAN TUBE: UNREMARKABLE. NO MALIGNANCY IDENTIFIED. - LEFT OVARY: FOCAL ADENOCARCINOMA ASSOCIATED WITH EXTENSIVE FIBROSIS. - LEFT FALLOPIAN TUBE: HYDROSALPINX. NO MALIGNANCY IDENTIFIED. 3. Lymph node, biopsy, right external iliac - METASTATIC ADENOCARCINOMA, 4. Soft tissue, biopsy, left para colic gutter - FOCAL ADENOCARCINOMA ASSOCIATED WITH FIBROSIS. Microscopic Comment 2. OVARY Specimen(s): Uterus with bilateral ovaries, omentum, right external iliac lymph node and left paracolic gutter. Procedure: (including lymph node sampling) Hysterectomy with bilateral salpingo-oophorectomy, omentectomy, lymph node biopsy and paracolic gutter biopsy. Primary tumor site (including laterality): Left ovary. Ovarian surface involvement: Yes Ovarian capsule intact without fragmentation: N/A Maximum tumor size (cm): 2 cm, 2 cm Histologic type: Serous carcinoma. Grade: 3 Peritoneal implants: (specify invasive or non-invasive): Left paracolic gutter positive for carcinoma Pelvic extension (list additional structures on separate lines and if involved): Omentum, right paracolic gutter and uterine serosa. Lymph nodes: number examined 1 ; number positive 1 TNM code: ypT3a, ypN1b, ypMX FIGO Stage (based on pathologic findings, needs clinical correlation): IIIA2 Comments: The left ovary has a 2 cm nodule, which is composed primarily of fibrous tissue with small foci of residual adenocarcinoma. There is also microscopic involvement of the uterine serosa, the left  paracolic gutter biopsy and the omentum.  The left paracolic gutter and omental involvement is microscopic and both are associated with extensive fibrosis with focal hemosiderin deposition. The right external iliac node is completely replaced by metastatic adenocarcinoma with serous features. (JDP:ecj 03/30/2016)    03/24/2016 Surgery    Operation: Robotic-assisted laparoscopic total hysterectomy with bilateral salpingoophorectomy, omentectomy, radical tumor debulking  Surgeon: Donaciano Eva  Operative Findings:  : small nodules in omentum, no ascites. Omental nodule (2cm) adherent to lower anterior abdominal wall. 2cm left colonic gutter cystic nodule. 6cm right external iliac lymph node. Grossly normal ovaries and tubes. Fibroid uterus. No gross residual disease at completion of surgery representing R0 optimal/complete resection.     04/17/2016 Imaging    CT abdomen and pelvis 1. There is no evidence of acute inflammatory process within abdomen or pelvis. 2. Stable low-density lesion within caudate lobe of the liver. No new hepatic lesions are noted. 3. Again noted lobulated renal contour and multifocal renal cortical scarring. No hydronephrosis or hydroureter. 4. No significant mesenteric adenopathy. No retroperitoneal adenopathy. Stable minimal residual peritoneal thickening within pelvis. No new pelvic implants or pelvic ascites. 5. Status post hysterectomy.  Unremarkable urinary bladder. 6. Moderate stool within colon as described above. No evidence of colitis.     04/24/2016 - 06/19/2016 Chemotherapy    She received 3 more cycles of chemo after surgery    05/14/2016 Tumor Marker    Patient's tumor was tested for the following markers: CA-125 Results of the tumor marker test revealed 22.5    06/04/2016 Tumor Marker    Patient's tumor was tested for the following markers: CA-125 Results of the tumor marker test revealed 13.7    07/07/2016 Genetic Testing    Patient has genetic  testing done for breast/ovasrian cancer panel Results revealed patient has no actionable mutation    07/23/2016 Imaging    Ct abdomen and pelvis 1. Heterogeneously enhancing 4.9 x 3.2 x 4.4 cm mass along the right pelvic sidewall may represent locally recurrent disease or malignant lymphadenopathy. 2. No other signs of definite metastatic disease noted elsewhere in the abdomen or pelvis. 3. Stable low-attenuation hepatic lesion in the caudate lobe of the liver, which appears to demonstrates some progressive centripetal filling on delayed images. This lesion is presumably benign given its stability compared to prior studies, and is favored to represent a small cavernous hemangioma. 4. Cardiomegaly with biatrial dilatation, which is very severe on the right side. 5. Aortic atherosclerosis. 6. Additional incidental findings, as above.    07/23/2016 Tumor Marker    Patient's tumor was tested for the following markers: CA-125 Results of the tumor marker test revealed 12.3    09/30/2016 Imaging    Ct abdomen and pelvis: 1. 4.9 cm heterogeneously enhancing mass seen along the right pelvic sidewall previously has almost completely resolved. There is some ill-defined soft tissue attenuation/peritoneal thickening in this region today but no discrete measurable lesion is evident. 2. No new or progressive findings on today's exam. 3. Stable hypo attenuating lesion in the caudate lobe of the liver. 4. Subtle aortic atherosclerosis    09/30/2016 Tumor Marker    Patient's tumor was tested for the following markers: CA-125 Results of the tumor marker test revealed 9.6    10/05/2016 Pathology Results    Vagina, biopsy, left cuff - BENIGN FIBROEPITHELIAL (STROMAL) POLYP. - NO DYSPLASIA, ATYPIA OR MALIGNANCY IDENTIFIED.    01/14/2017 Tumor Marker    Patient's tumor was tested for the following markers: CA-125 Results of the tumor marker test revealed  11.9    05/05/2017 Tumor Marker    Patient's tumor  was tested for the following markers: CA-125 Results of the tumor marker test revealed 28    06/14/2017 Tumor Marker    Patient's tumor was tested for the following markers: CA-125 Results of the tumor marker test revealed 45.7    06/24/2017 Imaging    Ct abdomen and pelvis: Increased peritoneal metastatic disease in abdomen and pelvis since prior exam. No evidence of ascites. Stable small benign hepatic hemangioma.    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 Does of chemo is reduced starting cycle 2 onwards    09/02/2017 Tumor Marker    Patient's tumor was tested for the following markers: CA-125 Results of the tumor marker test revealed 27.7    09/16/2017 Imaging    CT CHEST IMPRESSION  1. No acute process or evidence of metastatic disease in the chest. 2. Right upper lung bronchiectasis, consolidation, and architectural distortion are most likely post infectious or inflammatory and not significantly changed. 3. Aortic Atherosclerosis (ICD10-I70.0).  CT ABDOMEN AND PELVIS IMPRESSION  1. Response to therapy of nodal and peritoneal metastasis. 2. No new or progressive disease. 3. Caudate lobe hemangioma, as before. 4. Bilateral renal cortical thinning. Similar minimal right-sided caliectasis and hydroureter. Likely secondary to low-grade obstruction by the dominant right pelvic implant.    09/22/2017 Tumor Marker    Patient's tumor was tested for the following markers: CA-125 Results of the tumor marker test revealed 18    11/03/2017 Tumor Marker    Patient's tumor was tested for the following markers: CA-125 Results of the tumor marker test revealed 13.4    11/25/2017 Imaging    1. Stable to slight interval decrease in size of  nodal and peritoneal metastatic disease within the abdomen. 2. No new or progressive disease.    12/03/2017 -  Chemotherapy    The patient is prescribed Zejula    01/17/2018 Tumor Marker    Patient's tumor was tested for the following markers: CA-125 Results of the tumor marker test revealed 11.9    02/22/2018 Tumor Marker    Patient's tumor was tested for the following markers: CA-125 Results of the tumor marker test revealed 11.7    02/22/2018 Imaging    1. Improved nodular thickening along the vaginal cuff on the left and medial to the right iliac vessels, likely treated implants. No evidence of progressive peritoneal disease. 2. Stable hepatic hemangioma. No evidence of solid visceral organ metastases. 3. Bilateral renal cortical scarring.    05/19/2018 Tumor Marker    Patient's tumor was tested for the following markers: CA-125 Results of the tumor marker test revealed 9.9    08/10/2018 Imaging    Stable mild nodular thickening of the left vaginal cuff. No new or progressive disease within the abdomen or pelvis.  Stable small hepatic hemangioma.    09/21/2018 Tumor Marker    Patient's tumor was tested for the following markers: CA-125 Results of the tumor marker test revealed 10.3    11/11/2018 Tumor Marker    Patient's tumor was tested for the following markers: CA-125 Results of the tumor marker test revealed 10.5     REVIEW OF SYSTEMS:   Constitutional: Denies fevers,  chills or abnormal weight loss Eyes: Denies blurriness of vision Ears, nose, mouth, throat, and face: Denies mucositis or sore throat Cardiovascular: Denies palpitation, chest discomfort or lower extremity swelling Gastrointestinal:  Denies nausea, heartburn or change in bowel habits Skin: Denies abnormal skin rashes Lymphatics: Denies new lymphadenopathy or easy bruising Neurological:Denies numbness, tingling or new weaknesses Behavioral/Psych: Mood is stable, no new changes  All other systems were  reviewed with the patient and are negative.  I have reviewed the past medical history, past surgical history, social history and family history with the patient and they are unchanged from previous note.  ALLERGIES:  is allergic to codeine; lactulose; and latex.  MEDICATIONS:  Current Outpatient Medications  Medication Sig Dispense Refill  . apixaban (ELIQUIS) 5 MG TABS tablet Take 1 tablet (5 mg total) by mouth 2 (two) times daily. 60 tablet 5  . aspirin EC 81 MG tablet Take 81 mg by mouth daily.    Marland Kitchen atorvastatin (LIPITOR) 40 MG tablet Take 40 mg by mouth at bedtime.    . diphenhydrAMINE (BENADRYL) 25 MG tablet Take 25 mg by mouth every 6 (six) hours as needed for allergies.    Marland Kitchen FLUAD 0.5 ML SUSY Inject 1 Dose into the skin once.  0  . gabapentin (NEURONTIN) 300 MG capsule TAKE ONE CAPSULE BY MOUTH TWICE DAILY (Patient not taking: No sig reported) 60 capsule 0  . gabapentin (NEURONTIN) 300 MG capsule TAKE 1 CAPSULE(300 MG) BY MOUTH THREE TIMES DAILY (Patient not taking: No sig reported) 90 capsule 0  . gabapentin (NEURONTIN) 600 MG tablet Take 1 tablet (600 mg total) by mouth 2 (two) times daily. 60 tablet 11  . guaifenesin (ROBITUSSIN) 100 MG/5ML syrup Take 200 mg by mouth 3 (three) times daily as needed for cough.    . lidocaine-prilocaine (EMLA) cream Apply to Porta-Cath 1-2 hours prior to access as directed. 30 g 1  . loratadine (CLARITIN) 10 MG tablet Take 10 mg by mouth daily as needed (Recommended for chemo pain). Reported on 03/16/2016    . metoprolol succinate (TOPROL-XL) 25 MG 24 hr tablet Take 25 mg by mouth daily.   5  . Multiple Vitamin (MULTIVITAMIN WITH MINERALS) TABS tablet Take 1 tablet by mouth daily.    Marland Kitchen omega-3 acid ethyl esters (LOVAZA) 1 g capsule Take 1 g by mouth daily.    Marland Kitchen oxyCODONE (OXY IR/ROXICODONE) 5 MG immediate release tablet Take 1 tablet (5 mg total) by mouth every 6 (six) hours as needed for severe pain. 90 tablet 0  . PARoxetine (PAXIL) 20 MG tablet TAKE  1 TABLET BY MOUTH DAILY (Patient not taking: Reported on 12/08/2018) 90 tablet 0  . PARoxetine (PAXIL) 40 MG tablet Take 40 mg by mouth daily.  0  . polyethylene glycol (MIRALAX / GLYCOLAX) packet Take 17 g by mouth daily.    . verapamil (CALAN-SR) 180 MG CR tablet Take 180 mg by mouth daily.  0  . verapamil (CALAN-SR) 240 MG CR tablet Take 1 tablet (240 mg total) by mouth daily. 30 tablet 0  . ZEJULA 100 MG CAPS TAKE 3 CAPSULES (300MG) BY MOUTH DAILY (Patient taking differently: Take 300 mg by mouth daily. ) 90 capsule 11   No current facility-administered medications for this visit.     PHYSICAL EXAMINATION: ECOG PERFORMANCE STATUS: 2 - Symptomatic, <50% confined to bed  Vitals:   12/15/18 1106  BP: 117/65  Pulse: 87  Resp: 18  Temp: 98.1 F (36.7 C)  SpO2: 100%  Filed Weights   12/15/18 1106  Weight: 202 lb 12.8 oz (92 kg)    GENERAL:alert, no distress and comfortable SKIN: skin color, texture, turgor are normal, no rashes or significant lesions EYES: normal, Conjunctiva are pink and non-injected, sclera clear OROPHARYNX:no exudate, no erythema and lips, buccal mucosa, and tongue normal  NECK: supple, thyroid normal size, non-tender, without nodularity LYMPH:  no palpable lymphadenopathy in the cervical, axillary or inguinal LUNGS: clear to auscultation and percussion with normal breathing effort HEART: regular rate & rhythm and no murmurs and no lower extremity edema ABDOMEN:abdomen soft, non-tender and normal bowel sounds Musculoskeletal:no cyanosis of digits and no clubbing  NEURO: alert & oriented x 3 with dysarthria,  no focal motor/sensory deficits  LABORATORY DATA:  I have reviewed the data as listed    Component Value Date/Time   NA 139 12/15/2018 1014   NA 142 12/08/2017 1051   K 3.2 (L) 12/15/2018 1014   K 4.4 12/08/2017 1051   CL 102 12/15/2018 1014   CL 105 06/14/2017 1430   CO2 27 12/15/2018 1014   CO2 28 12/08/2017 1051   GLUCOSE 114 (H) 12/15/2018  1014   GLUCOSE 97 12/08/2017 1051   BUN 14 12/15/2018 1014   BUN 16.2 12/08/2017 1051   CREATININE 0.91 12/15/2018 1014   CREATININE 0.9 12/08/2017 1051   CALCIUM 9.5 12/15/2018 1014   CALCIUM 9.5 12/08/2017 1051   PROT 7.3 12/15/2018 1014   PROT 6.9 12/08/2017 1051   ALBUMIN 3.1 (L) 12/15/2018 1014   ALBUMIN 3.6 12/08/2017 1051   AST 57 (H) 12/15/2018 1014   AST 18 12/08/2017 1051   ALT 62 (H) 12/15/2018 1014   ALT 20 12/08/2017 1051   ALKPHOS 172 (H) 12/15/2018 1014   ALKPHOS 131 12/08/2017 1051   BILITOT 1.2 12/15/2018 1014   BILITOT 0.70 12/08/2017 1051   GFRNONAA >60 12/15/2018 1014   GFRAA >60 12/15/2018 1014    No results found for: SPEP, UPEP  Lab Results  Component Value Date   WBC 4.6 12/15/2018   NEUTROABS 3.3 12/15/2018   HGB 8.5 (L) 12/15/2018   HCT 24.7 (L) 12/15/2018   MCV 118.8 (H) 12/15/2018   PLT 134 (L) 12/15/2018      Chemistry      Component Value Date/Time   NA 139 12/15/2018 1014   NA 142 12/08/2017 1051   K 3.2 (L) 12/15/2018 1014   K 4.4 12/08/2017 1051   CL 102 12/15/2018 1014   CL 105 06/14/2017 1430   CO2 27 12/15/2018 1014   CO2 28 12/08/2017 1051   BUN 14 12/15/2018 1014   BUN 16.2 12/08/2017 1051   CREATININE 0.91 12/15/2018 1014   CREATININE 0.9 12/08/2017 1051      Component Value Date/Time   CALCIUM 9.5 12/15/2018 1014   CALCIUM 9.5 12/08/2017 1051   ALKPHOS 172 (H) 12/15/2018 1014   ALKPHOS 131 12/08/2017 1051   AST 57 (H) 12/15/2018 1014   AST 18 12/08/2017 1051   ALT 62 (H) 12/15/2018 1014   ALT 20 12/08/2017 1051   BILITOT 1.2 12/15/2018 1014   BILITOT 0.70 12/08/2017 1051       RADIOGRAPHIC STUDIES: I have personally reviewed the radiological images as listed and agreed with the findings in the report. Dg Chest 2 View  Result Date: 12/08/2018 CLINICAL DATA:  Cough and congestion.  History of breast carcinoma EXAM: CHEST - 2 VIEW COMPARISON:  Chest radiograph Apr 30, 2016 and chest CT September 16, 2017  FINDINGS: Port-A-Cath tip is in the superior vena cava. No pneumothorax. There is scarring with volume loss in the right upper lobe near the apex, not felt to be appreciably changed. No new opacities are evident. There is no edema or consolidation. Heart is upper normal in size with pulmonary vascularity on the left normal and pulmonary vascularity on the right somewhat distorted due to the scarring in the right upper lobe region. No adenopathy is appreciable. Postoperative changes noted in the left breast region. No evident bone lesions. IMPRESSION: Stable scarring with retraction and volume loss right upper lobe toward the apex. Lungs elsewhere clear. Stable cardiac silhouette. No adenopathy evident. Port-A-Cath tip in superior vena cava. Postoperative change left breast. Electronically Signed   By: Lowella Grip III M.D.   On: 12/08/2018 09:28    All questions were answered. The patient knows to call the clinic with any problems, questions or concerns. No barriers to learning was detected.  I spent 25 minutes counseling the patient face to face. The total time spent in the appointment was 30 minutes and more than 50% was on counseling and review of test results  Heath Lark, MD 12/15/2018 12:58 PM

## 2018-12-15 NOTE — Assessment & Plan Note (Signed)
She has significant pancytopenia, likely exacerbated by recent infection I recommend holding treatment for 1 week and then resume I plan to see her again in 3 weeks for further follow-up At this point in time, she does not need transfusion support

## 2018-12-15 NOTE — Assessment & Plan Note (Signed)
Last CT imaging showed no signs of cancer recurrence/progression She will continue with Niraparib indefinitely However, due to significant pancytopenia, I plan to put her treatment on hold for 1 week.  She will resume after that and I plan to see her again at the end of the month for further follow-up I plan to repeat imaging study again in March 2020

## 2018-12-15 NOTE — Assessment & Plan Note (Signed)
She has diagnosis of acute URI and currently is on antibiotics treatment I recommend putting her treatment on hold She will complete her antibiotic treatment

## 2018-12-15 NOTE — Assessment & Plan Note (Signed)
She is on chronic anticoagulation therapy She denies bleeding She will continue her treatment

## 2018-12-16 LAB — CA 125: Cancer Antigen (CA) 125: 12.6 U/mL (ref 0.0–38.1)

## 2018-12-23 ENCOUNTER — Emergency Department (HOSPITAL_COMMUNITY)
Admission: EM | Admit: 2018-12-23 | Discharge: 2018-12-23 | Disposition: A | Discharge status: Home / Self Care | Payer: Medicare Other | Attending: Emergency Medicine | Admitting: Emergency Medicine

## 2018-12-23 DIAGNOSIS — Z7901 Long term (current) use of anticoagulants: Secondary | ICD-10-CM | POA: Insufficient documentation

## 2018-12-23 DIAGNOSIS — R1310 Dysphagia, unspecified: Secondary | ICD-10-CM | POA: Diagnosis not present

## 2018-12-23 DIAGNOSIS — D649 Anemia, unspecified: Secondary | ICD-10-CM | POA: Insufficient documentation

## 2018-12-23 DIAGNOSIS — Z79899 Other long term (current) drug therapy: Secondary | ICD-10-CM | POA: Diagnosis not present

## 2018-12-23 DIAGNOSIS — Z7982 Long term (current) use of aspirin: Secondary | ICD-10-CM | POA: Diagnosis not present

## 2018-12-23 DIAGNOSIS — I10 Essential (primary) hypertension: Secondary | ICD-10-CM | POA: Insufficient documentation

## 2018-12-23 LAB — CBC WITH DIFFERENTIAL/PLATELET
Abs Immature Granulocytes: 0.02 10*3/uL (ref 0.00–0.07)
Basophils Absolute: 0 10*3/uL (ref 0.0–0.1)
Basophils Relative: 0 %
Eosinophils Absolute: 0 10*3/uL (ref 0.0–0.5)
Eosinophils Relative: 1 %
HCT: 28.4 % — ABNORMAL LOW (ref 36.0–46.0)
Hemoglobin: 9.2 g/dL — ABNORMAL LOW (ref 12.0–15.0)
Immature Granulocytes: 0 %
Lymphocytes Relative: 24 %
Lymphs Abs: 1.4 10*3/uL (ref 0.7–4.0)
MCH: 39.8 pg — ABNORMAL HIGH (ref 26.0–34.0)
MCHC: 32.4 g/dL (ref 30.0–36.0)
MCV: 122.9 fL — ABNORMAL HIGH (ref 80.0–100.0)
Monocytes Absolute: 0.6 10*3/uL (ref 0.1–1.0)
Monocytes Relative: 10 %
Neutro Abs: 3.7 10*3/uL (ref 1.7–7.7)
Neutrophils Relative %: 65 %
Platelets: 207 10*3/uL (ref 150–400)
RBC: 2.31 MIL/uL — ABNORMAL LOW (ref 3.87–5.11)
RDW: 18.3 % — ABNORMAL HIGH (ref 11.5–15.5)
WBC: 5.7 10*3/uL (ref 4.0–10.5)
nRBC: 1.4 % — ABNORMAL HIGH (ref 0.0–0.2)

## 2018-12-23 LAB — COMPREHENSIVE METABOLIC PANEL
ALT: 25 U/L (ref 0–44)
AST: 20 U/L (ref 15–41)
Albumin: 3.5 g/dL (ref 3.5–5.0)
Alkaline Phosphatase: 136 U/L — ABNORMAL HIGH (ref 38–126)
Anion gap: 10 (ref 5–15)
BUN: 10 mg/dL (ref 8–23)
CO2: 27 mmol/L (ref 22–32)
Calcium: 9.6 mg/dL (ref 8.9–10.3)
Chloride: 104 mmol/L (ref 98–111)
Creatinine, Ser: 0.68 mg/dL (ref 0.44–1.00)
GFR calc Af Amer: >60 mL/min (ref >60)
GFR calc non Af Amer: >60 mL/min (ref >60)
Glucose, Bld: 114 mg/dL — ABNORMAL HIGH (ref 70–99)
Potassium: 3.4 mmol/L — ABNORMAL LOW (ref 3.5–5.1)
Sodium: 141 mmol/L (ref 135–145)
Total Bilirubin: 1 mg/dL (ref 0.3–1.2)
Total Protein: 7.8 g/dL (ref 6.5–8.1)

## 2018-12-23 LAB — LIPASE, BLOOD: Lipase: 29 U/L (ref 11–51)

## 2018-12-23 MED ORDER — ONDANSETRON 4 MG PO TBDP: 4.0000 mg | ORAL_TABLET | Freq: Three times a day (TID) | ORAL | 0 refills | Status: DC | PRN

## 2018-12-23 MED ORDER — HEPARIN SOD (PORK) LOCK FLUSH 100 UNIT/ML IV SOLN
500.0000 [IU] | Freq: Once | INTRAVENOUS | Status: AC
  Administered 2018-12-23: 500 [IU]
  Filled 2018-12-23: qty 5

## 2018-12-23 MED ORDER — ONDANSETRON HCL 4 MG/2ML IJ SOLN
4.0000 mg | Freq: Once | INTRAMUSCULAR | Status: AC
  Administered 2018-12-23: 4 mg via INTRAVENOUS
  Filled 2018-12-23: qty 2

## 2018-12-23 NOTE — Discharge Instructions (Signed)
Use zofran as directed as needed for nausea. Stay well hydrated with plenty of water/fluids. Try to eat a soft diet. Break your pills apart to help swallow them. Follow up with your regular doctor as well as your GI specialist in the next 3-5 days for recheck of symptoms and ongoing management of your condition. Return to the ER for emergent changes or worsening symptoms.

## 2018-12-23 NOTE — ED Triage Notes (Addendum)
Pt to ED with complaints of trouble swallowing. Pt has hx of stroke 4 years ago with right sided deficits and uses a walker. Pt started having trouble with swallowing big pills in the beginning of December. Pt has progressed to having trouble swallowing all pills and food now. Pt Gastro scheduled a swallow study next Thursday. Pt and family decided to come today because the new trouble with swallowing food. Pt is A&O x4, pupils are equal and reactive. Pt speech is to her Norm

## 2018-12-23 NOTE — ED Notes (Signed)
Pt given water.  Pt had trouble swallowing water, stated it was painful, very difficult, and she didn't feel comfortable drinking more.

## 2018-12-23 NOTE — ED Provider Notes (Signed)
[facility] DEPT Provider Note   CSN: [id number] Arrival date & time: 12/23/18  1145     History   Chief Complaint Chief Complaint  Patient presents with  . Dysphagia    HPI Brenda Cunningham is a 68 y.o. female with a PMHx of Afib on eliquis, HTN, HLD, anemia, CVA with R sided deficits and expressive aphasia, and other conditions listed below, who presents to the ED with complaints of dysphagia that initially began in the beginning of December, got better, but then got worse this week.  Patient and her family state that she started having difficulty swallowing her pills in the beginning of December, she saw GI specialist in [place] who set up an appointment for "the x-ray center in [place]" for 12/29/2018.  She states that it initially got better but this week it seemed to get worse again.  She states that yesterday she was able to eat some food, but today she was unable to eat her scrambled eggs.  She is able to swallow liquids.  She cannot swallow her pills, she states that they feel like they get stuck and then she regurgitates them.  She states that when she tries to drink water it hurts going down but she is able to keep this down.  She has tried throat lozenges without relief, and the only aggravating factor is swallowing.  She reports some nausea and regurgitation/emesis, but no hematemesis.  She came in because today she was unable to eat her food so she decided to get reevaluated.  She denies any recent fevers or chills, chest pain, shortness of breath, abdominal pain, hematemesis, diarrhea, constipation, dysuria, hematuria, numbness, tingling, focal weakness, or any other complaints at this time.  Of note, she has black spots in her mouth and tongue which have been there for several months, she has seen ENT and they said "that sometimes it happens".  These have not changed.  Of note, chart review reveals that she was seen by Dr. Shary Key at  [place] on 10/13/18 who felt that this was a late effect of her stroke, was setting up for a modified barium swallow and esophagram to r/o esophageal lesion however he referred her back to speech and swallow therapists to work with her on this issue. Unclear if she got the barium swallow and esophagram done yet, or if this is what is scheduled on 12/29/18.   The history is provided by the patient and medical records. No language interpreter was used.    Past Medical History:  Diagnosis Date  . A-fib ([place])   . Anxiety   . Arthritis   . Breast cancer ([place]) 2008   s/p lumpectomy, chemo and radiation therapy  . Complication of anesthesia    DIFFICULTY WAKING UP FROM ANESTHESIA  . Hyperlipidemia   . Hypertension   . Personal history of chemotherapy   . Personal history of radiation therapy   . Stroke ([place]) 07/29/2015  . Wears glasses     Patient Active Problem List   Diagnosis Date Noted  . Pancytopenia, acquired ([place]) 12/15/2018  . Acute URI 12/15/2018  . Deficiency anemia 05/19/2018  . History of stroke 04/04/2018  . Hemiparesis affecting right side as late effect of cerebrovascular accident ([place]) 04/04/2018  . Other constipation 01/17/2018  . Anemia, chronic disease 11/25/2017  . Goals of care, counseling/discussion 07/15/2017  . Genetic testing 08/17/2016  . Leukopenia due to antineoplastic chemotherapy ([place]) 06/05/2016  . Costochondritis, acute 05/02/2016  . Ovarian cancer (  [place]) 03/24/2016  . Anemia due to antineoplastic chemotherapy 03/17/2016  . Port catheter in place 03/17/2016  . Portacath in place 02/05/2016  . Chemotherapy-induced peripheral neuropathy ([place]) 02/05/2016  . Hematoma 01/27/2016  . Chronic anticoagulation 01/22/2016  . Chronic atrial fibrillation 01/22/2016  . PICC (peripherally inserted central catheter) in place 01/22/2016  . Chemotherapy induced neutropenia ([place]) 01/22/2016  . Poor venous access 01/22/2016  . CVA (cerebral vascular accident)  ([place]) 12/27/2015  . Right hemiparesis ([place]) 12/27/2015  . Expressive dysphasia 12/27/2015  . History of left breast cancer 12/27/2015  . H/O tubal ligation 12/27/2015  . Colon polyps 12/27/2015  . International Federation of Gynecology and Obstetrics (FIGO) stage IVB epithelial ovarian cancer ([place]) 12/27/2015  . Ovarian cancer, bilateral ([place]) 12/24/2015  . Inguinal adenopathy   . Carcinomatosis ([place]) 12/06/2015  . Essential hypertension, benign 07/18/2014  . A-fib ([place]) 07/18/2014  . Anxiety state, unspecified 07/18/2014    Past Surgical History:  Procedure Laterality Date  . APPENDECTOMY    . BREAST LUMPECTOMY  2008   left  . COLONOSCOPY  2004  . DEBULKING N/A 03/24/2016   Procedure: RADICAL TUMOR DEBULKING;  Surgeon: Everitt Amber, MD;  Location: [facility];  Service: Gynecology;  Laterality: N/A;  . OMENTECTOMY N/A 03/24/2016   Procedure: OMENTECTOMY;  Surgeon: Everitt Amber, MD;  Location: [facility];  Service: Gynecology;  Laterality: N/A;  . ROBOTIC ASSISTED TOTAL HYSTERECTOMY WITH BILATERAL SALPINGO OOPHERECTOMY Bilateral 03/24/2016   Procedure: XI ROBOTIC ASSISTED TOTA LAPAROSCOPIC  HYSTERECTOMY WITH BILATERAL SALPINGO OOPHORECTOMY;  Surgeon: Everitt Amber, MD;  Location: [facility];  Service: Gynecology;  Laterality: Bilateral;  . TUBAL LIGATION       OB History   No obstetric history on file.      Home Medications    Prior to Admission medications   Medication Sig Start Date End Date Taking? Authorizing Provider  apixaban (ELIQUIS) 5 MG TABS tablet Take 1 tablet (5 mg total) by mouth 2 (two) times daily. 09/11/15   Tysinger, Camelia Eng, PA-C  aspirin EC 81 MG tablet Take 81 mg by mouth daily.    [provider]  atorvastatin (LIPITOR) 40 MG tablet Take 40 mg by mouth at bedtime.    [provider]  diphenhydrAMINE (BENADRYL) 25 MG tablet Take 25 mg by mouth every 6 (six) hours as needed for allergies.    [provider]  FLUAD 0.5 ML SUSY Inject 1 Dose into the  skin once. 08/31/18   [provider]  gabapentin (NEURONTIN) 300 MG capsule TAKE ONE CAPSULE BY MOUTH TWICE DAILY Patient not taking: No sig reported 10/03/18   Heath Lark, MD  gabapentin (NEURONTIN) 300 MG capsule TAKE 1 CAPSULE(300 MG) BY MOUTH THREE TIMES DAILY Patient not taking: No sig reported 10/31/18   Nicholas Lose, MD  gabapentin (NEURONTIN) 600 MG tablet Take 1 tablet (600 mg total) by mouth 2 (two) times daily. 08/31/18 12/08/18  Heath Lark, MD  guaifenesin (ROBITUSSIN) 100 MG/5ML syrup Take 200 mg by mouth 3 (three) times daily as needed for cough.    [provider]  lidocaine-prilocaine (EMLA) cream Apply to Porta-Cath 1-2 hours prior to access as directed. 09/14/18   Heath Lark, MD  loratadine (CLARITIN) 10 MG tablet Take 10 mg by mouth daily as needed (Recommended for chemo pain). Reported on 03/16/2016    [provider]  metoprolol succinate (TOPROL-XL) 25 MG 24 hr tablet Take 25 mg by mouth daily.  03/28/18   [provider]  Multiple  Vitamin (MULTIVITAMIN WITH MINERALS) TABS tablet Take 1 tablet by mouth daily.    [provider]  omega-3 acid ethyl esters (LOVAZA) 1 g capsule Take 1 g by mouth daily.    [provider]  oxyCODONE (OXY IR/ROXICODONE) 5 MG immediate release tablet Take 1 tablet (5 mg total) by mouth every 6 (six) hours as needed for severe pain. 09/21/18   Heath Lark, MD  PARoxetine (PAXIL) 20 MG tablet TAKE 1 TABLET BY MOUTH DAILY Patient not taking: Reported on 12/08/2018 06/03/15   Tysinger, Camelia Eng, PA-C  PARoxetine (PAXIL) 40 MG tablet Take 40 mg by mouth daily. 11/11/18   [provider]  polyethylene glycol (MIRALAX / GLYCOLAX) packet Take 17 g by mouth daily.    [provider]  verapamil (CALAN-SR) 180 MG CR tablet Take 180 mg by mouth daily. 10/03/18   [provider]  verapamil (CALAN-SR) 240 MG CR tablet Take 1 tablet (240 mg total) by mouth daily. 07/25/15   Tysinger, Camelia Eng,  PA-C  ZEJULA 100 MG CAPS TAKE 3 CAPSULES (300MG ) BY MOUTH DAILY Patient taking differently: Take 300 mg by mouth daily.  10/06/18   Nicholas Lose, MD  prochlorperazine (COMPAZINE) 10 MG tablet Take 1 tablet (10 mg total) by mouth every 6 (six) hours as needed (Nausea or vomiting). 07/13/17 01/17/18  Heath Lark, MD    Family History Family History  Problem Relation Age of Onset  . Cirrhosis Father        +heavy EtOH  . Diabetes Maternal Uncle   . Other Daughter        hx benign breast bx  . Breast cancer Neg Hx     Social History Social History   Tobacco Use  . Smoking status: Never Smoker  . Smokeless tobacco: Never Used  Substance Use Topics  . Alcohol use: Yes    Comment: occ beer or glass of wine  . Drug use: No     Allergies   Codeine; Lactulose; and Latex   Review of Systems Review of Systems  Constitutional: Negative for chills and fever.  HENT: Positive for trouble swallowing.   Respiratory: Negative for shortness of breath.   Cardiovascular: Negative for chest pain.  Gastrointestinal: Positive for nausea and vomiting. Negative for abdominal pain, constipation and diarrhea.  Genitourinary: Negative for dysuria and hematuria.  Musculoskeletal: Negative for arthralgias and myalgias.  Skin: Negative for color change.  Allergic/Immunologic: Negative for immunocompromised state.  Neurological: Negative for weakness and numbness.  Psychiatric/Behavioral: Negative for confusion.   All other systems reviewed and are negative for acute change except as noted in the HPI.    Physical Exam Updated Vital Signs BP 122/70 (BP Location: Left Arm)   Pulse 93   Temp 98.1 F (36.7 C) (Oral)   Resp 18   SpO2 100%   Physical Exam Vitals signs and nursing note reviewed.  Constitutional:      General: She is not in acute distress.    Appearance: Normal appearance. She is well-developed. She is not toxic-appearing.     Comments: Afebrile, nontoxic, NAD  HENT:     Head:  Normocephalic and atraumatic.     Mouth/Throat:     Mouth: Mucous membranes are moist. Oral lesions (black spots) present.     Tongue: Lesions (black spots) present.     Pharynx: Oropharynx is clear. Uvula midline. No pharyngeal swelling, oropharyngeal exudate, posterior oropharyngeal erythema or uvula swelling.     Tonsils: No tonsillar exudate or tonsillar  abscesses. Swelling: 0 on the right. 0 on the left.     Comments: Oropharynx moist with black spots to mucosa and tongue, without uvular swelling or deviation, no trismus or drooling, no tonsillar swelling or erythema, no exudates.   Eyes:     General:        Right eye: No discharge.        Left eye: No discharge.     Conjunctiva/sclera: Conjunctivae normal.  Neck:     Musculoskeletal: Normal range of motion and neck supple.  Cardiovascular:     Rate and Rhythm: Normal rate and regular rhythm.     Pulses: Normal pulses.     Heart sounds: Normal heart sounds, S1 normal and S2 normal. No murmur. No friction rub. No gallop.   Pulmonary:     Effort: Pulmonary effort is normal. No respiratory distress.     Breath sounds: Normal breath sounds. No decreased breath sounds, wheezing, rhonchi or rales.  Abdominal:     General: Bowel sounds are normal. There is no distension.     Palpations: Abdomen is soft. Abdomen is not rigid.     Tenderness: There is no abdominal tenderness. There is no right CVA tenderness, left CVA tenderness, guarding or rebound. Negative signs include Murphy's sign and McBurney's sign.  Musculoskeletal: Normal range of motion.  Skin:    General: Skin is warm and dry.     Findings: No rash.  Neurological:     Mental Status: She is alert and oriented to person, place, and time.     Sensory: Sensation is intact. No sensory deficit.     Motor: Motor function is intact.  Psychiatric:        Mood and Affect: Mood and affect normal.        Behavior: Behavior normal.      ED Treatments / Results  Labs (all labs  ordered are listed, but only abnormal results are displayed) Labs Reviewed  CBC WITH DIFFERENTIAL/PLATELET - Abnormal; Notable for the following components:      Result Value   RBC 2.31 (*)    Hemoglobin 9.2 (*)    HCT 28.4 (*)    MCV 122.9 (*)    MCH 39.8 (*)    RDW 18.3 (*)    nRBC 1.4 (*)    All other components within normal limits  COMPREHENSIVE METABOLIC PANEL - Abnormal; Notable for the following components:   Potassium 3.4 (*)    Glucose, Bld 114 (*)    Alkaline Phosphatase 136 (*)    All other components within normal limits  LIPASE, BLOOD    EKG None  Radiology No results found.  Procedures Procedures (including critical care time)  Medications Ordered in ED Medications  ondansetron (ZOFRAN) injection 4 mg (4 mg Intravenous Given 12/23/18 1359)     Initial Impression / Assessment and Plan / ED Course  I have reviewed the triage vital signs and the nursing notes.  Pertinent labs & imaging results that were available during my care of the patient were reviewed by me and considered in my medical decision making (see chart for details).     69 y.o. female here with dysphagia progressive since last month. States pills get stuck and come back up, today tried to eat scrambled eggs and couldn't; water/liquids go down. No abdominal tenderness on exam. Throat and tongue with black spots but otherwise clear. Will get labs, give zofran, and PO challenge with liquids, then reassess. Discussed case with my attending Dr.  Regenia Skeeter who agrees with plan.   3:35 PM CBC w/diff with chronic stable anemia. CMP with marginally low K 3.4 but doubt need for repletion, otherwise unremarkable. Lipase WNL. Pt tolerating PO well here. Will send home with zofran, advised breaking pills apart to help swallow them and call her dr to see if there are liquid forms of the meds that she can take, advised f/up with her PCP and her GI specialist in 3-5 days for recheck and ongoing management. I  explained the diagnosis and have given explicit precautions to return to the ER including for any other new or worsening symptoms. The patient understands and accepts the medical plan as it's been dictated and I have answered their questions. Discharge instructions concerning home care and prescriptions have been given. The patient is STABLE and is discharged to home in good condition.    Final Clinical Impressions(s) / ED Diagnoses   Final diagnoses:  Dysphagia, unspecified type  Chronic anemia    ED Discharge Orders         Ordered    ondansetron (ZOFRAN ODT) 4 MG disintegrating tablet  Every 8 hours PRN     12/23/18 [address] [place], [place] 12/23/18 1535    Sherwood Gambler, MD 12/26/18 [phone]

## 2019-01-06 ENCOUNTER — Inpatient Hospital Stay (HOSPITAL_BASED_OUTPATIENT_CLINIC_OR_DEPARTMENT_OTHER): Payer: Medicare Other | Admitting: Hematology and Oncology

## 2019-01-06 ENCOUNTER — Encounter: Payer: Self-pay | Admitting: Hematology and Oncology

## 2019-01-06 ENCOUNTER — Inpatient Hospital Stay: Payer: Medicare Other

## 2019-01-06 ENCOUNTER — Telehealth: Payer: Self-pay | Admitting: Hematology and Oncology

## 2019-01-06 VITALS — BP 125/64 | HR 70 | Temp 98.0°F | Resp 18 | Ht 69.0 in | Wt 204.6 lb

## 2019-01-06 DIAGNOSIS — Z8543 Personal history of malignant neoplasm of ovary: Secondary | ICD-10-CM

## 2019-01-06 DIAGNOSIS — R1084 Generalized abdominal pain: Secondary | ICD-10-CM

## 2019-01-06 DIAGNOSIS — R1310 Dysphagia, unspecified: Secondary | ICD-10-CM

## 2019-01-06 DIAGNOSIS — Z7982 Long term (current) use of aspirin: Secondary | ICD-10-CM

## 2019-01-06 DIAGNOSIS — C562 Malignant neoplasm of left ovary: Secondary | ICD-10-CM

## 2019-01-06 DIAGNOSIS — J069 Acute upper respiratory infection, unspecified: Secondary | ICD-10-CM

## 2019-01-06 DIAGNOSIS — K5909 Other constipation: Secondary | ICD-10-CM

## 2019-01-06 DIAGNOSIS — D61818 Other pancytopenia: Secondary | ICD-10-CM

## 2019-01-06 DIAGNOSIS — I482 Chronic atrial fibrillation, unspecified: Secondary | ICD-10-CM

## 2019-01-06 DIAGNOSIS — D638 Anemia in other chronic diseases classified elsewhere: Secondary | ICD-10-CM

## 2019-01-06 DIAGNOSIS — Z9221 Personal history of antineoplastic chemotherapy: Secondary | ICD-10-CM

## 2019-01-06 DIAGNOSIS — R109 Unspecified abdominal pain: Secondary | ICD-10-CM

## 2019-01-06 DIAGNOSIS — Z79899 Other long term (current) drug therapy: Secondary | ICD-10-CM

## 2019-01-06 LAB — COMPREHENSIVE METABOLIC PANEL
ALT: 19 U/L (ref 0–44)
AST: 19 U/L (ref 15–41)
Albumin: 3.4 g/dL — ABNORMAL LOW (ref 3.5–5.0)
Alkaline Phosphatase: 176 U/L — ABNORMAL HIGH (ref 38–126)
Anion gap: 10 (ref 5–15)
BUN: 9 mg/dL (ref 8–23)
CO2: 27 mmol/L (ref 22–32)
Calcium: 9.8 mg/dL (ref 8.9–10.3)
Chloride: 106 mmol/L (ref 98–111)
Creatinine, Ser: 0.85 mg/dL (ref 0.44–1.00)
GFR calc Af Amer: >60 mL/min (ref >60)
GFR calc non Af Amer: >60 mL/min (ref >60)
Glucose, Bld: 128 mg/dL — ABNORMAL HIGH (ref 70–99)
Potassium: 3.8 mmol/L (ref 3.5–5.1)
Sodium: 143 mmol/L (ref 135–145)
Total Bilirubin: 1.1 mg/dL (ref 0.3–1.2)
Total Protein: 7.3 g/dL (ref 6.5–8.1)

## 2019-01-06 LAB — CBC WITH DIFFERENTIAL/PLATELET
Abs Immature Granulocytes: 0.01 10*3/uL (ref 0.00–0.07)
Basophils Absolute: 0 10*3/uL (ref 0.0–0.1)
Basophils Relative: 1 %
Eosinophils Absolute: 0.1 10*3/uL (ref 0.0–0.5)
Eosinophils Relative: 1 %
HCT: 29.7 % — ABNORMAL LOW (ref 36.0–46.0)
Hemoglobin: 9.9 g/dL — ABNORMAL LOW (ref 12.0–15.0)
Immature Granulocytes: 0 %
Lymphocytes Relative: 22 %
Lymphs Abs: 1 10*3/uL (ref 0.7–4.0)
MCH: 40.1 pg — ABNORMAL HIGH (ref 26.0–34.0)
MCHC: 33.3 g/dL (ref 30.0–36.0)
MCV: 120.2 fL — ABNORMAL HIGH (ref 80.0–100.0)
Monocytes Absolute: 0.3 10*3/uL (ref 0.1–1.0)
Monocytes Relative: 7 %
Neutro Abs: 3.2 10*3/uL (ref 1.7–7.7)
Neutrophils Relative %: 69 %
Platelets: 238 10*3/uL (ref 150–400)
RBC: 2.47 MIL/uL — ABNORMAL LOW (ref 3.87–5.11)
RDW: 17 % — ABNORMAL HIGH (ref 11.5–15.5)
WBC: 4.7 10*3/uL (ref 4.0–10.5)
nRBC: 3 % — ABNORMAL HIGH (ref 0.0–0.2)

## 2019-01-06 NOTE — Assessment & Plan Note (Signed)
The patient is complaining of some intermittent abdominal discomfort and recent dysphagia Even though her tumor marker is stable, I am concerned about possible disease recurrence I recommend CT imaging for further evaluation next week and she agreed to proceed

## 2019-01-06 NOTE — Assessment & Plan Note (Signed)
She has generalized intermittent abdominal pain and recent onset of dysphagia She is requesting refill of prescription oxycodone and the dose to be increased due to the pain I explained to the patient the rationale of pursuing imaging study first and she agreed to proceed

## 2019-01-06 NOTE — Telephone Encounter (Signed)
Gave avs and calendar ° °

## 2019-01-06 NOTE — Progress Notes (Signed)
[place] OFFICE PROGRESS NOTE  Patient Care Team: Helane Rima, MD as PCP - General (Family Medicine) Encarnacion Slates, MD as Referring Physician (Neurology)  ASSESSMENT & PLAN:  Ovarian cancer [facility]) The patient is complaining of some intermittent abdominal discomfort and recent dysphagia Even though her tumor marker is stable, I am concerned about possible disease recurrence I recommend CT imaging for further evaluation next week and she agreed to proceed  Anemia, chronic disease She has chronic anemia, likely due to side effects of treatment She is not symptomatic Observe only.  Abdominal pain She has generalized intermittent abdominal pain and recent onset of dysphagia She is requesting refill of prescription oxycodone and the dose to be increased due to the pain I explained to the patient the rationale of pursuing imaging study first and she agreed to proceed   Orders Placed This Encounter  Procedures  . CT ABDOMEN PELVIS W CONTRAST    Standing Status:   Future    Standing Expiration Date:   01/07/2020    Order Specific Question:   If indicated for the ordered procedure, I authorize the administration of contrast media per Radiology protocol    Answer:   Yes    Order Specific Question:   Preferred imaging location?    Answer:   [facility]    Order Specific Question:   Radiology Contrast Protocol - do NOT remove file path    Answer:   [email or web address]    INTERVAL HISTORY: Please see below for problem oriented charting. She returns with her husband for further follow-up She has been complaining of intermittent abdominal pain Recently, she complained of new onset of dysphagia and is undergoing yearly evaluation by gastroenterologist She denies bloating She has chronic constipation, stable  SUMMARY OF ONCOLOGIC HISTORY: Oncology History   Negative genetic testing     Ovarian cancer ([place])   11/28/2015 Imaging    Ct  scan of abdomen: Peritoneal carcinomatosis and pelvic/inguinal adenopathy. Gynecologic primary is favored.    12/17/2015 Pathology Results    Lymph node, needle/core biopsy, L inguinal LAN - METASTATIC ADENOCARCINOMA. Microscopic Comment Immunohistochemistry will be performed and reported as an addendum. (JDP:ecj 12/18/2015) ADDENDUM: Immunohistochemistry shows strong positivity with cytokeratin 7, estrogen receptor (ER), progesterone receptor (PR), p53 and WT1. Negative markers are cytokeratin 20 , CDX- 2 and gross cystic disease fluid protein. 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 (JDP:kh 12-19-15    12/17/2015 Procedure    Technically successful ultrasound guided core left inguinal adenopathy biopsy    12/24/2015 Tumor Marker    Patient's tumor was tested for the following markers: CA-125 Results of the tumor marker test revealed 608.2    12/27/2015 - 02/28/2016 Chemotherapy    She received neoadjuvant chemo x 3 cycles    01/01/2016 Tumor Marker    Patient's tumor was tested for the following markers: CA-125 Results of the tumor marker test revealed 741.6    01/29/2016 Procedure    Successful placement of a right internal jugular approach power injectable Port-A-Cath. The catheter is ready for immediate use    01/29/2016 Imaging    US abdomen 1. Hyperechoic 2.5 x 1.0 x 1.6 cm lesion in the region the caudate lobe of the liver. Similar finding noted on prior CT of 11/28/2015. This could represent hemangioma. This could represent a malignancy including metastatic disease. 2. Exam otherwise unremarkable. No gallstones. No biliary distention.    02/03/2016 Tumor  Marker    Patient's tumor was tested for the following markers: CA-125 Results of the tumor marker test revealed 137.1    02/20/2016 Imaging    MRI brain No acute infarct.  Remote large left middle cerebral artery distribution  infarct.  No intracranial mass.  Moderate small vessel disease changes.  Global atrophy without hydrocephalus.  Expanded partially empty sella without secondary findings of pseudotumor cerebri.  Minimal mucosal thickening ethmoid sinus air cells. Small air-fluid levels maxillary sinuses bilaterally may indicate changes of acute sinusitis.     02/28/2016 Tumor Marker    Patient's tumor was tested for the following markers: CA-125 Results of the tumor marker test revealed 33.6    03/02/2016 Imaging    CT chest, abdomen and pelvis 1. Today's study demonstrates a positive response to therapy with resolution of the previously noted malignant ascites, significant regression of previously noted peritoneal implants, and regression of previously noted lymphadenopathy in the abdomen and pelvis. 2. No definite evidence to suggest metastatic disease to the thorax. 3. Stable 1.0 x 1.7 cm intermediate attenuation lesion associated with the posterior aspect of segment 1 of the liver, favored to represent a mildly proteinaceous hepatic cyst. The possibility of a peritoneal implant in this region is not entirely excluded, but is not favored on today's examination. 4. Extensive post infectious scarring inguinal upper right lung, likely sequela of prior necrotizing pneumonia. 5. Multiple tiny pulmonary nodules scattered throughout the lungs bilaterally all measuring 4 mm or less. These are nonspecific, but favored to be benign. Given the patient's history of primary gynecologic malignancy, attention on followup studies is recommended to ensure the stability of these findings. 6. Additional incidental findings, as above    03/14/2016 Imaging    CT angiogram 1. No pulmonary emboli. 2. Chronic changes to the right upper lobe. 3. Stable lesion in the caudate lobe of the liver. 4. Stable soft tissue prominence to the right of the trachea as described above. 5. Stable small nodule in the right lung.      03/24/2016 Pathology Results    1. Omentum, resection for tumor - FOCAL ADENOCARCINOMA ASSOCIATED WITH EXTENSIVE FIBROSIS, INFLAMMATION AND HEMOSIDERIN DEPOSITION. 2. Uterus +/- tubes/ovaries, neoplastic, cervix - CERVIX: SLIGHT CERVICITIS AND SQUAMOUS METAPLASIA. - ENDOMETRIUM: ATROPHIC, NO HYPERPLASIA OR MALIGNANCY. - MYOMETRIUM: LEIOMYOMATA WITH DEGENERATIVE CHANGES. NO MALIGNANCY. - UTERINE SEROSA: FOCAL ADENOCARCINOMA ASSOCIATED WITH ADHESIONS. - RIGHT OVARY: BENIGN CALCIFIED NODULE AND BENIGN SEROUS CYST. NO MALIGNANCY IDENTIFIED. - RIGHT FALLOPIAN TUBE: UNREMARKABLE. NO MALIGNANCY IDENTIFIED. - LEFT OVARY: FOCAL ADENOCARCINOMA ASSOCIATED WITH EXTENSIVE FIBROSIS. - LEFT FALLOPIAN TUBE: HYDROSALPINX. NO MALIGNANCY IDENTIFIED. 3. Lymph node, biopsy, right external iliac - METASTATIC ADENOCARCINOMA, 4. Soft tissue, biopsy, left para colic gutter - FOCAL ADENOCARCINOMA ASSOCIATED WITH FIBROSIS. Microscopic Comment 2. OVARY Specimen(s): Uterus with bilateral ovaries, omentum, right external iliac lymph node and left paracolic gutter. Procedure: (including lymph node sampling) Hysterectomy with bilateral salpingo-oophorectomy, omentectomy, lymph node biopsy and paracolic gutter biopsy. Primary tumor site (including laterality): Left ovary. Ovarian surface involvement: Yes Ovarian capsule intact without fragmentation: N/A Maximum tumor size (cm): 2 cm, 2 cm Histologic type: Serous carcinoma. Grade: 3 Peritoneal implants: (specify invasive or non-invasive): Left paracolic gutter positive for carcinoma Pelvic extension (list additional structures on separate lines and if involved): Omentum, right paracolic gutter and uterine serosa. Lymph nodes: number examined 1 ; number positive 1 TNM code: ypT3a, ypN1b, ypMX FIGO Stage (based on pathologic findings, needs clinical correlation): IIIA2 Comments: The left ovary has a 2 cm nodule, which  is composed primarily of fibrous tissue with small  foci of residual adenocarcinoma. There is also microscopic involvement of the uterine serosa, the left paracolic gutter biopsy and the omentum. The left paracolic gutter and omental involvement is microscopic and both are associated with extensive fibrosis with focal hemosiderin deposition. The right external iliac node is completely replaced by metastatic adenocarcinoma with serous features. (JDP:ecj 03/30/2016)    03/24/2016 Surgery    Operation: Robotic-assisted laparoscopic total hysterectomy with bilateral salpingoophorectomy, omentectomy, radical tumor debulking  Surgeon: Donaciano Eva  Operative Findings:  : small nodules in omentum, no ascites. Omental nodule (2cm) adherent to lower anterior abdominal wall. 2cm left colonic gutter cystic nodule. 6cm right external iliac lymph node. Grossly normal ovaries and tubes. Fibroid uterus. No gross residual disease at completion of surgery representing R0 optimal/complete resection.     04/17/2016 Imaging    CT abdomen and pelvis 1. There is no evidence of acute inflammatory process within abdomen or pelvis. 2. Stable low-density lesion within caudate lobe of the liver. No new hepatic lesions are noted. 3. Again noted lobulated renal contour and multifocal renal cortical scarring. No hydronephrosis or hydroureter. 4. No significant mesenteric adenopathy. No retroperitoneal adenopathy. Stable minimal residual peritoneal thickening within pelvis. No new pelvic implants or pelvic ascites. 5. Status post hysterectomy.  Unremarkable urinary bladder. 6. Moderate stool within colon as described above. No evidence of colitis.     04/24/2016 - 06/19/2016 Chemotherapy    She received 3 more cycles of chemo after surgery    05/14/2016 Tumor Marker    Patient's tumor was tested for the following markers: CA-125 Results of the tumor marker test revealed 22.5    06/04/2016 Tumor Marker    Patient's tumor was tested for the following markers:  CA-125 Results of the tumor marker test revealed 13.7    07/07/2016 Genetic Testing    Patient has genetic testing done for breast/ovasrian cancer panel Results revealed patient has no actionable mutation    07/23/2016 Imaging    Ct abdomen and pelvis 1. Heterogeneously enhancing 4.9 x 3.2 x 4.4 cm mass along the right pelvic sidewall may represent locally recurrent disease or malignant lymphadenopathy. 2. No other signs of definite metastatic disease noted elsewhere in the abdomen or pelvis. 3. Stable low-attenuation hepatic lesion in the caudate lobe of the liver, which appears to demonstrates some progressive centripetal filling on delayed images. This lesion is presumably benign given its stability compared to prior studies, and is favored to represent a small cavernous hemangioma. 4. Cardiomegaly with biatrial dilatation, which is very severe on the right side. 5. Aortic atherosclerosis. 6. Additional incidental findings, as above.    07/23/2016 Tumor Marker    Patient's tumor was tested for the following markers: CA-125 Results of the tumor marker test revealed 12.3    09/30/2016 Imaging    Ct abdomen and pelvis: 1. 4.9 cm heterogeneously enhancing mass seen along the right pelvic sidewall previously has almost completely resolved. There is some ill-defined soft tissue attenuation/peritoneal thickening in this region today but no discrete measurable lesion is evident. 2. No new or progressive findings on today's exam. 3. Stable hypo attenuating lesion in the caudate lobe of the liver. 4. Subtle aortic atherosclerosis    09/30/2016 Tumor Marker    Patient's tumor was tested for the following markers: CA-125 Results of the tumor marker test revealed 9.6    10/05/2016 Pathology Results    Vagina, biopsy, left cuff - BENIGN FIBROEPITHELIAL (STROMAL) POLYP. - NO  DYSPLASIA, ATYPIA OR MALIGNANCY IDENTIFIED.    01/14/2017 Tumor Marker    Patient's tumor was tested for the following  markers: CA-125 Results of the tumor marker test revealed 11.9    05/05/2017 Tumor Marker    Patient's tumor was tested for the following markers: CA-125 Results of the tumor marker test revealed 28    06/14/2017 Tumor Marker    Patient's tumor was tested for the following markers: CA-125 Results of the tumor marker test revealed 45.7    06/24/2017 Imaging    Ct abdomen and pelvis: Increased peritoneal metastatic disease in abdomen and pelvis since prior exam. No evidence of ascites. Stable small benign hepatic hemangioma.    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 Does of chemo is reduced starting cycle 2 onwards    09/02/2017 Tumor Marker    Patient's tumor was tested for the following markers: CA-125 Results of the tumor marker test revealed 27.7    09/16/2017 Imaging    CT CHEST IMPRESSION  1. No acute process or evidence of metastatic disease in the chest. 2. Right upper lung bronchiectasis, consolidation, and architectural distortion are most likely post infectious or inflammatory and not significantly changed. 3. Aortic Atherosclerosis (ICD10-I70.0).  CT ABDOMEN AND PELVIS IMPRESSION  1. Response to therapy of nodal and peritoneal metastasis. 2. No new or progressive disease. 3. Caudate lobe hemangioma, as before. 4. Bilateral renal cortical thinning. Similar minimal right-sided caliectasis and hydroureter. Likely secondary to low-grade obstruction by the dominant right pelvic implant.    09/22/2017 Tumor Marker    Patient's tumor was tested for the following markers: CA-125 Results of the tumor marker test revealed 18    11/03/2017 Tumor Marker    Patient's tumor was tested for the following markers: CA-125 Results of the  tumor marker test revealed 13.4    11/25/2017 Imaging    1. Stable to slight interval decrease in size of nodal and peritoneal metastatic disease within the abdomen. 2. No new or progressive disease.    12/03/2017 -  Chemotherapy    The patient is prescribed Zejula    01/17/2018 Tumor Marker    Patient's tumor was tested for the following markers: CA-125 Results of the tumor marker test revealed 11.9    02/22/2018 Tumor Marker    Patient's tumor was tested for the following markers: CA-125 Results of the tumor marker test revealed 11.7    02/22/2018 Imaging    1. Improved nodular thickening along the vaginal cuff on the left and medial to the right iliac vessels, likely treated implants. No evidence of progressive peritoneal disease. 2. Stable hepatic hemangioma. No evidence of solid visceral organ metastases. 3. Bilateral renal cortical scarring.    05/19/2018 Tumor Marker    Patient's tumor was tested for the following markers: CA-125 Results of the tumor marker test revealed 9.9    08/10/2018 Imaging    Stable mild nodular thickening of the left vaginal cuff. No new or progressive disease within the abdomen or pelvis.  Stable small hepatic hemangioma.    09/21/2018 Tumor Marker    Patient's tumor was tested for the following markers: CA-125 Results of the tumor marker test revealed 10.3    11/11/2018 Tumor Marker  Patient's tumor was tested for the following markers: CA-125 Results of the tumor marker test revealed 10.5    12/15/2018 Tumor Marker    Patient's tumor was tested for the following markers: CA-125 Results of the tumor marker test revealed 12.6     REVIEW OF SYSTEMS:   Constitutional: Denies fevers, chills or abnormal weight loss Eyes: Denies blurriness of vision Ears, nose, mouth, throat, and face: Denies mucositis or sore throat Respiratory: Denies cough, dyspnea or wheezes Cardiovascular: Denies palpitation, chest discomfort or lower extremity  swelling Skin: Denies abnormal skin rashes Lymphatics: Denies new lymphadenopathy or easy bruising Neurological:Denies numbness, tingling or new weaknesses Behavioral/Psych: Mood is stable, no new changes  All other systems were reviewed with the patient and are negative.  I have reviewed the past medical history, past surgical history, social history and family history with the patient and they are unchanged from previous note.  ALLERGIES:  is allergic to codeine; lactulose; and latex.  MEDICATIONS:  Current Outpatient Medications  Medication Sig Dispense Refill  . apixaban (ELIQUIS) 5 MG TABS tablet Take 1 tablet (5 mg total) by mouth 2 (two) times daily. 60 tablet 5  . aspirin EC 81 MG tablet Take 81 mg by mouth daily.    Marland Kitchen atorvastatin (LIPITOR) 40 MG tablet Take 40 mg by mouth at bedtime.    . benzonatate (TESSALON) 100 MG capsule Take 100 mg by mouth 3 (three) times daily.    . diphenhydrAMINE (BENADRYL) 25 MG tablet Take 25 mg by mouth every 6 (six) hours as needed for allergies.    . fluticasone (FLONASE) 50 MCG/ACT nasal spray Place 2 sprays into both nostrils 2 (two) times daily.    Marland Kitchen gabapentin (NEURONTIN) 300 MG capsule TAKE ONE CAPSULE BY MOUTH TWICE DAILY (Patient not taking: No sig reported) 60 capsule 0  . gabapentin (NEURONTIN) 300 MG capsule TAKE 1 CAPSULE(300 MG) BY MOUTH THREE TIMES DAILY (Patient not taking: No sig reported) 90 capsule 0  . gabapentin (NEURONTIN) 600 MG tablet Take 1 tablet (600 mg total) by mouth 2 (two) times daily. 60 tablet 11  . guaifenesin (ROBITUSSIN) 100 MG/5ML syrup Take 200 mg by mouth 3 (three) times daily as needed for cough.    . lidocaine-prilocaine (EMLA) cream Apply to Porta-Cath 1-2 hours prior to access as directed. (Patient taking differently: Apply 1 application topically as needed (port access). ) 30 g 1  . metoprolol succinate (TOPROL-XL) 25 MG 24 hr tablet Take 25 mg by mouth daily.   5  . Multiple Vitamin (MULTIVITAMIN WITH  MINERALS) TABS tablet Take 1 tablet by mouth daily.    Marland Kitchen omega-3 acid ethyl esters (LOVAZA) 1 g capsule Take 1 g by mouth daily.    . ondansetron (ZOFRAN ODT) 4 MG disintegrating tablet Take 1 tablet (4 mg total) by mouth every 8 (eight) hours as needed for nausea or vomiting. 15 tablet 0  . oxyCODONE (OXY IR/ROXICODONE) 5 MG immediate release tablet Take 1 tablet (5 mg total) by mouth every 6 (six) hours as needed for severe pain. 90 tablet 0  . PARoxetine (PAXIL) 20 MG tablet TAKE 1 TABLET BY MOUTH DAILY (Patient not taking: No sig reported) 90 tablet 0  . PARoxetine (PAXIL) 40 MG tablet Take 40 mg by mouth daily.  0  . polyethylene glycol (MIRALAX / GLYCOLAX) packet Take 17 g by mouth daily.    . verapamil (CALAN-SR) 240 MG CR tablet Take 1 tablet (240 mg total) by mouth daily. 30 tablet 0  .  ZEJULA 100 MG CAPS TAKE 3 CAPSULES (300MG) BY MOUTH DAILY (Patient taking differently: Take 300 mg by mouth daily. ) 90 capsule 11   No current facility-administered medications for this visit.     PHYSICAL EXAMINATION: ECOG PERFORMANCE STATUS: 2 - Symptomatic, <50% confined to bed  Vitals:   01/06/19 1053  BP: 125/64  Pulse: 70  Resp: 18  Temp: 98 F (36.7 C)  SpO2: 100%   Filed Weights   01/06/19 1053  Weight: 204 lb 9.6 oz (92.8 kg)    GENERAL:alert, no distress and comfortable SKIN: skin color, texture, turgor are normal, no rashes or significant lesions EYES: normal, Conjunctiva are pink and non-injected, sclera clear OROPHARYNX:no exudate, no erythema and lips, buccal mucosa, and tongue normal  NECK: supple, thyroid normal size, non-tender, without nodularity LYMPH:  no palpable lymphadenopathy in the cervical, axillary or inguinal LUNGS: clear to auscultation and percussion with normal breathing effort HEART: regular rate & rhythm and no murmurs and no lower extremity edema ABDOMEN:abdomen soft, non-tender and normal bowel sounds Musculoskeletal:no cyanosis of digits and no  clubbing  NEURO: alert & oriented x 3 with dysarthria, with chronic right hemiplegia LABORATORY DATA:  I have reviewed the data as listed    Component Value Date/Time   NA 143 01/06/2019 1018   NA 142 12/08/2017 1051   K 3.8 01/06/2019 1018   K 4.4 12/08/2017 1051   CL 106 01/06/2019 1018   CL 105 06/14/2017 1430   CO2 27 01/06/2019 1018   CO2 28 12/08/2017 1051   GLUCOSE 128 (H) 01/06/2019 1018   GLUCOSE 97 12/08/2017 1051   BUN 9 01/06/2019 1018   BUN 16.2 12/08/2017 1051   CREATININE 0.85 01/06/2019 1018   CREATININE 0.9 12/08/2017 1051   CALCIUM 9.8 01/06/2019 1018   CALCIUM 9.5 12/08/2017 1051   PROT 7.3 01/06/2019 1018   PROT 6.9 12/08/2017 1051   ALBUMIN 3.4 (L) 01/06/2019 1018   ALBUMIN 3.6 12/08/2017 1051   AST 19 01/06/2019 1018   AST 18 12/08/2017 1051   ALT 19 01/06/2019 1018   ALT 20 12/08/2017 1051   ALKPHOS 176 (H) 01/06/2019 1018   ALKPHOS 131 12/08/2017 1051   BILITOT 1.1 01/06/2019 1018   BILITOT 0.70 12/08/2017 1051   GFRNONAA >60 01/06/2019 1018   GFRAA >60 01/06/2019 1018    No results found for: SPEP, UPEP  Lab Results  Component Value Date   WBC 4.7 01/06/2019   NEUTROABS 3.2 01/06/2019   HGB 9.9 (L) 01/06/2019   HCT 29.7 (L) 01/06/2019   MCV 120.2 (H) 01/06/2019   PLT 238 01/06/2019      Chemistry      Component Value Date/Time   NA 143 01/06/2019 1018   NA 142 12/08/2017 1051   K 3.8 01/06/2019 1018   K 4.4 12/08/2017 1051   CL 106 01/06/2019 1018   CL 105 06/14/2017 1430   CO2 27 01/06/2019 1018   CO2 28 12/08/2017 1051   BUN 9 01/06/2019 1018   BUN 16.2 12/08/2017 1051   CREATININE 0.85 01/06/2019 1018   CREATININE 0.9 12/08/2017 1051      Component Value Date/Time   CALCIUM 9.8 01/06/2019 1018   CALCIUM 9.5 12/08/2017 1051   ALKPHOS 176 (H) 01/06/2019 1018   ALKPHOS 131 12/08/2017 1051   AST 19 01/06/2019 1018   AST 18 12/08/2017 1051   ALT 19 01/06/2019 1018   ALT 20 12/08/2017 1051   BILITOT 1.1 01/06/2019 1018  BILITOT 0.70 12/08/2017 1051       RADIOGRAPHIC STUDIES: I have personally reviewed the radiological images as listed and agreed with the findings in the report. Dg Chest 2 View  Result Date: 12/08/2018 CLINICAL DATA:  Cough and congestion.  History of breast carcinoma EXAM: CHEST - 2 VIEW COMPARISON:  Chest radiograph Apr 30, 2016 and chest CT September 16, 2017 FINDINGS: Port-A-Cath tip is in the superior vena cava. No pneumothorax. There is scarring with volume loss in the right upper lobe near the apex, not felt to be appreciably changed. No new opacities are evident. There is no edema or consolidation. Heart is upper normal in size with pulmonary vascularity on the left normal and pulmonary vascularity on the right somewhat distorted due to the scarring in the right upper lobe region. No adenopathy is appreciable. Postoperative changes noted in the left breast region. No evident bone lesions. IMPRESSION: Stable scarring with retraction and volume loss right upper lobe toward the apex. Lungs elsewhere clear. Stable cardiac silhouette. No adenopathy evident. Port-A-Cath tip in superior vena cava. Postoperative change left breast. Electronically Signed   By: Lowella Grip III M.D.   On: 12/08/2018 09:28    All questions were answered. The patient knows to call the clinic with any problems, questions or concerns. No barriers to learning was detected.  I spent 25 minutes counseling the patient face to face. The total time spent in the appointment was 30 minutes and more than 50% was on counseling and review of test results  Heath Lark, MD 01/06/2019 2:19 PM

## 2019-01-06 NOTE — Assessment & Plan Note (Signed)
She has chronic anemia, likely due to side effects of treatment She is not symptomatic Observe only. 

## 2019-01-07 LAB — CA 125: Cancer Antigen (CA) 125: 14.6 U/mL (ref 0.0–38.1)

## 2019-01-09 ENCOUNTER — Telehealth: Payer: Self-pay

## 2019-01-09 MED FILL — ZEJULA 100 MG CAPS: 100 | 30 days supply | Qty: 90 | Fill #3

## 2019-01-09 NOTE — Telephone Encounter (Signed)
Oral Oncology Patient Advocate Encounter  I was successful at securing a grant with [facility]) for $3,400. This will keep the out of pocket expense for Zejula at $0. The grant information is as follows and has been shared with [place].  Approval dates: 10/11/18-01/09/20 ID: [id number] Group: [id number] BIN: [id number] PCN: [id number]  The patient and her husband are aware and verbalized understanding and great appreciation.   [facility] [place] Phone [phone] Fax [phone]

## 2019-01-11 ENCOUNTER — Other Ambulatory Visit: Payer: Self-pay | Admitting: Internal Medicine

## 2019-01-11 ENCOUNTER — Other Ambulatory Visit (HOSPITAL_COMMUNITY): Payer: Self-pay | Admitting: Internal Medicine

## 2019-01-11 DIAGNOSIS — R933 Abnormal findings on diagnostic imaging of other parts of digestive tract: Secondary | ICD-10-CM

## 2019-01-11 NOTE — Addendum Note (Signed)
Addended by: Mariane Masters on: 01/11/2019 02:39 PM   Modules accepted: Orders

## 2019-01-12 ENCOUNTER — Ambulatory Visit (HOSPITAL_COMMUNITY)
Admission: RE | Admit: 2019-01-12 | Discharge: 2019-01-12 | Disposition: A | Discharge status: Home / Self Care | Payer: Medicare Other | Source: Ambulatory Visit | Attending: Hematology and Oncology | Admitting: Hematology and Oncology

## 2019-01-12 ENCOUNTER — Other Ambulatory Visit (HOSPITAL_COMMUNITY): Payer: Medicare Other

## 2019-01-12 ENCOUNTER — Encounter (HOSPITAL_COMMUNITY): Payer: Self-pay

## 2019-01-12 DIAGNOSIS — R933 Abnormal findings on diagnostic imaging of other parts of digestive tract: Secondary | ICD-10-CM | POA: Diagnosis present

## 2019-01-12 DIAGNOSIS — C562 Malignant neoplasm of left ovary: Secondary | ICD-10-CM | POA: Diagnosis not present

## 2019-01-12 MED ORDER — SODIUM CHLORIDE (PF) 0.9 % IJ SOLN
INTRAMUSCULAR | Status: AC
  Filled 2019-01-12: qty 50

## 2019-01-12 MED ORDER — IOHEXOL 300 MG/ML  SOLN
100.0000 mL | Freq: Once | INTRAMUSCULAR | Status: AC | PRN
  Administered 2019-01-12: 100 mL via INTRAVENOUS

## 2019-01-12 MED ORDER — HEPARIN SOD (PORK) LOCK FLUSH 100 UNIT/ML IV SOLN
INTRAVENOUS | Status: AC
  Filled 2019-01-12: qty 5

## 2019-01-13 ENCOUNTER — Inpatient Hospital Stay: Payer: Medicare Other | Attending: Hematology and Oncology | Admitting: Hematology and Oncology

## 2019-01-13 ENCOUNTER — Telehealth: Payer: Self-pay | Admitting: Hematology and Oncology

## 2019-01-13 ENCOUNTER — Encounter: Payer: Self-pay | Admitting: Hematology and Oncology

## 2019-01-13 DIAGNOSIS — Z8673 Personal history of transient ischemic attack (TIA), and cerebral infarction without residual deficits: Secondary | ICD-10-CM | POA: Diagnosis not present

## 2019-01-13 DIAGNOSIS — K5909 Other constipation: Secondary | ICD-10-CM | POA: Insufficient documentation

## 2019-01-13 DIAGNOSIS — I482 Chronic atrial fibrillation, unspecified: Secondary | ICD-10-CM | POA: Diagnosis not present

## 2019-01-13 DIAGNOSIS — C562 Malignant neoplasm of left ovary: Secondary | ICD-10-CM

## 2019-01-13 DIAGNOSIS — C786 Secondary malignant neoplasm of retroperitoneum and peritoneum: Secondary | ICD-10-CM | POA: Diagnosis not present

## 2019-01-13 DIAGNOSIS — C569 Malignant neoplasm of unspecified ovary: Secondary | ICD-10-CM | POA: Diagnosis present

## 2019-01-13 DIAGNOSIS — Z7982 Long term (current) use of aspirin: Secondary | ICD-10-CM | POA: Insufficient documentation

## 2019-01-13 DIAGNOSIS — R739 Hyperglycemia, unspecified: Secondary | ICD-10-CM | POA: Insufficient documentation

## 2019-01-13 DIAGNOSIS — Z79899 Other long term (current) drug therapy: Secondary | ICD-10-CM | POA: Insufficient documentation

## 2019-01-13 DIAGNOSIS — Q394 Esophageal web: Secondary | ICD-10-CM | POA: Insufficient documentation

## 2019-01-13 DIAGNOSIS — Z7901 Long term (current) use of anticoagulants: Secondary | ICD-10-CM | POA: Insufficient documentation

## 2019-01-13 MED ORDER — DARBEPOETIN ALFA 300 MCG/0.6ML IJ SOSY
PREFILLED_SYRINGE | INTRAMUSCULAR | Status: AC
  Filled 2019-01-13: qty 0.6

## 2019-01-13 NOTE — Assessment & Plan Note (Signed)
Her recent upper endoscopy revealed esophageal webbing likely due to chronic reflux disease She underwent esophageal dilatation for stricture with improvement of her dysphagia I recommend consideration to discontinue aspirin therapy

## 2019-01-13 NOTE — Assessment & Plan Note (Signed)
She is on chronic anticoagulation therapy for history of stroke However, I felt that aspirin might have contributed to esophageal damage She will continue her oral anticoagulation therapy but I recommend the patient and family to consider discontinuation of aspirin

## 2019-01-13 NOTE — Assessment & Plan Note (Signed)
She has been complaining of intermittent pain and has been taking oxycodone CT scan showed no evidence of disease It did show signs of possible stool retention I recommend laxative therapy and reminded her not to take oxycodone for that reason

## 2019-01-13 NOTE — Assessment & Plan Note (Signed)
I have reviewed CT imaging with the patient and family She has no signs of active cancer She will continue niraparib indefinitely I plan to see her again in 3 months

## 2019-01-13 NOTE — Assessment & Plan Note (Signed)
She is noted to have persistent hyperglycemia I am wondering whether the patient might have developed type 2 diabetes We discussed risk factor modification for stroke prevention I recommend follow-up with primary care doctor for further evaluation

## 2019-01-13 NOTE — Telephone Encounter (Signed)
Gave avs and calendar ° °

## 2019-01-13 NOTE — Progress Notes (Signed)
[facility] OFFICE PROGRESS NOTE  Patient Care Team: Helane Rima, MD as PCP - General (Family Medicine) Encarnacion Slates, MD as Referring Physician (Neurology)  ASSESSMENT & PLAN:  Ovarian cancer [facility]) I have reviewed CT imaging with the patient and family She has no signs of active cancer She will continue niraparib indefinitely I plan to see her again in 3 months  Esophageal web determined by endoscopy Her recent upper endoscopy revealed esophageal webbing likely due to chronic reflux disease She underwent esophageal dilatation for stricture with improvement of her dysphagia I recommend consideration to discontinue aspirin therapy  Chronic atrial fibrillation ([place]) She is on chronic anticoagulation therapy for history of stroke However, I felt that aspirin might have contributed to esophageal damage She will continue her oral anticoagulation therapy but I recommend the patient and family to consider discontinuation of aspirin  Hyperglycemia She is noted to have persistent hyperglycemia I am wondering whether the patient might have developed type 2 diabetes We discussed risk factor modification for stroke prevention I recommend follow-up with primary care doctor for further evaluation  Other constipation She has been complaining of intermittent pain and has been taking oxycodone CT scan showed no evidence of disease It did show signs of possible stool retention I recommend laxative therapy and reminded her not to take oxycodone for that reason   No orders of the defined types were placed in this encounter.   INTERVAL HISTORY: Please see below for problem oriented charting. She returns with her husband and daughter for further follow-up and review test results Since last time I saw her, she was seen by gastroenterologist and underwent EGD which showed esophageal webbing It also showed stricture She underwent dilatation with improvement of her dysphagia She  has been taking oxycodone intermittently for abdominal gas discomfort/distention She denies recent nausea  SUMMARY OF ONCOLOGIC HISTORY: Oncology History   Negative genetic testing     Ovarian cancer ([place])   11/28/2015 Imaging    Ct scan of abdomen: Peritoneal carcinomatosis and pelvic/inguinal adenopathy. Gynecologic primary is favored.    12/17/2015 Pathology Results    Lymph node, needle/core biopsy, L inguinal LAN - METASTATIC ADENOCARCINOMA. Microscopic Comment Immunohistochemistry will be performed and reported as an addendum. (JDP:ecj 12/18/2015) ADDENDUM: Immunohistochemistry shows strong positivity with cytokeratin 7, estrogen receptor (ER), progesterone receptor (PR), p53 and WT1. Negative markers are cytokeratin 20 , CDX- 2 and gross cystic disease fluid protein. 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 (JDP:kh 12-19-15    12/17/2015 Procedure    Technically successful ultrasound guided core left inguinal adenopathy biopsy    12/24/2015 Tumor Marker    Patient's tumor was tested for the following markers: CA-125 Results of the tumor marker test revealed 608.2    12/27/2015 - 02/28/2016 Chemotherapy    She received neoadjuvant chemo x 3 cycles    01/01/2016 Tumor Marker    Patient's tumor was tested for the following markers: CA-125 Results of the tumor marker test revealed 741.6    01/29/2016 Procedure    Successful placement of a right internal jugular approach power injectable Port-A-Cath. The catheter is ready for immediate use    01/29/2016 Imaging    US abdomen 1. Hyperechoic 2.5 x 1.0 x 1.6 cm lesion in the region the caudate lobe of the liver. Similar finding noted on prior CT of 11/28/2015. This could represent hemangioma. This could represent a malignancy including metastatic disease. 2. Exam otherwise  unremarkable. No gallstones. No biliary distention.    02/03/2016  Tumor Marker    Patient's tumor was tested for the following markers: CA-125 Results of the tumor marker test revealed 137.1    02/20/2016 Imaging    MRI brain No acute infarct.  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  No intracranial mass.  Moderate small vessel disease changes.  Global atrophy without hydrocephalus.  Expanded partially empty sella without secondary findings of pseudotumor cerebri.  Minimal mucosal thickening ethmoid sinus air cells. Small air-fluid levels maxillary sinuses bilaterally may indicate changes of acute sinusitis.     02/28/2016 Tumor Marker    Patient's tumor was tested for the following markers: CA-125 Results of the tumor marker test revealed 33.6    03/02/2016 Imaging    CT chest, abdomen and pelvis 1. Today's study demonstrates a positive response to therapy with resolution of the previously noted malignant ascites, significant regression of previously noted peritoneal implants, and regression of previously noted lymphadenopathy in the abdomen and pelvis. 2. No definite evidence to suggest metastatic disease to the thorax. 3. Stable 1.0 x 1.7 cm intermediate attenuation lesion associated with the posterior aspect of segment 1 of the liver, favored to represent a mildly proteinaceous hepatic cyst. The possibility of a peritoneal implant in this region is not entirely excluded, but is not favored on today's examination. 4. Extensive post infectious scarring inguinal upper right lung, likely sequela of prior necrotizing pneumonia. 5. Multiple tiny pulmonary nodules scattered throughout the lungs bilaterally all measuring 4 mm or less. These are nonspecific, but favored to be benign. Given the patient's history of primary gynecologic malignancy, attention on followup studies is recommended to ensure the stability of these findings. 6. Additional incidental findings, as above    03/14/2016 Imaging    CT angiogram 1. No  pulmonary emboli. 2. Chronic changes to the right upper lobe. 3. Stable lesion in the caudate lobe of the liver. 4. Stable soft tissue prominence to the right of the trachea as described above. 5. Stable small nodule in the right lung.     03/24/2016 Pathology Results    1. Omentum, resection for tumor - FOCAL ADENOCARCINOMA ASSOCIATED WITH EXTENSIVE FIBROSIS, INFLAMMATION AND HEMOSIDERIN DEPOSITION. 2. Uterus +/- tubes/ovaries, neoplastic, cervix - CERVIX: SLIGHT CERVICITIS AND SQUAMOUS METAPLASIA. - ENDOMETRIUM: ATROPHIC, NO HYPERPLASIA OR MALIGNANCY. - MYOMETRIUM: LEIOMYOMATA WITH DEGENERATIVE CHANGES. NO MALIGNANCY. - UTERINE SEROSA: FOCAL ADENOCARCINOMA ASSOCIATED WITH ADHESIONS. - RIGHT OVARY: BENIGN CALCIFIED NODULE AND BENIGN SEROUS CYST. NO MALIGNANCY IDENTIFIED. - RIGHT FALLOPIAN TUBE: UNREMARKABLE. NO MALIGNANCY IDENTIFIED. - LEFT OVARY: FOCAL ADENOCARCINOMA ASSOCIATED WITH EXTENSIVE FIBROSIS. - LEFT FALLOPIAN TUBE: HYDROSALPINX. NO MALIGNANCY IDENTIFIED. 3. Lymph node, biopsy, right external iliac - METASTATIC ADENOCARCINOMA, 4. Soft tissue, biopsy, left para colic gutter - FOCAL ADENOCARCINOMA ASSOCIATED WITH FIBROSIS. Microscopic Comment 2. OVARY Specimen(s): Uterus with bilateral ovaries, omentum, right external iliac lymph node and left paracolic gutter. Procedure: (including lymph node sampling) Hysterectomy with bilateral salpingo-oophorectomy, omentectomy, lymph node biopsy and paracolic gutter biopsy. Primary tumor site (including laterality): Left ovary. Ovarian surface involvement: Yes Ovarian capsule intact without fragmentation: N/A Maximum tumor size (cm): 2 cm, 2 cm Histologic type: Serous carcinoma. Grade: 3 Peritoneal implants: (specify invasive or non-invasive): Left paracolic gutter positive for carcinoma Pelvic extension (list additional structures on separate lines and if involved): Omentum, right paracolic gutter and uterine serosa. Lymph nodes:  number examined 1 ; number positive 1 TNM code: ypT3a, ypN1b, ypMX FIGO Stage (based on pathologic findings, needs clinical  correlation): IIIA2 Comments: The left ovary has a 2 cm nodule, which is composed primarily of fibrous tissue with small foci of residual adenocarcinoma. There is also microscopic involvement of the uterine serosa, the left paracolic gutter biopsy and the omentum. The left paracolic gutter and omental involvement is microscopic and both are associated with extensive fibrosis with focal hemosiderin deposition. The right external iliac node is completely replaced by metastatic adenocarcinoma with serous features. (JDP:ecj 03/30/2016)    03/24/2016 Surgery    Operation: Robotic-assisted laparoscopic total hysterectomy with bilateral salpingoophorectomy, omentectomy, radical tumor debulking  Surgeon: Donaciano Eva  Operative Findings:  : small nodules in omentum, no ascites. Omental nodule (2cm) adherent to lower anterior abdominal wall. 2cm left colonic gutter cystic nodule. 6cm right external iliac lymph node. Grossly normal ovaries and tubes. Fibroid uterus. No gross residual disease at completion of surgery representing R0 optimal/complete resection.     04/17/2016 Imaging    CT abdomen and pelvis 1. There is no evidence of acute inflammatory process within abdomen or pelvis. 2. Stable low-density lesion within caudate lobe of the liver. No new hepatic lesions are noted. 3. Again noted lobulated renal contour and multifocal renal cortical scarring. No hydronephrosis or hydroureter. 4. No significant mesenteric adenopathy. No retroperitoneal adenopathy. Stable minimal residual peritoneal thickening within pelvis. No new pelvic implants or pelvic ascites. 5. Status post hysterectomy.  Unremarkable urinary bladder. 6. Moderate stool within colon as described above. No evidence of colitis.     04/24/2016 - 06/19/2016 Chemotherapy    She received 3 more cycles of  chemo after surgery    05/14/2016 Tumor Marker    Patient's tumor was tested for the following markers: CA-125 Results of the tumor marker test revealed 22.5    06/04/2016 Tumor Marker    Patient's tumor was tested for the following markers: CA-125 Results of the tumor marker test revealed 13.7    07/07/2016 Genetic Testing    Patient has genetic testing done for breast/ovasrian cancer panel Results revealed patient has no actionable mutation    07/23/2016 Imaging    Ct abdomen and pelvis 1. Heterogeneously enhancing 4.9 x 3.2 x 4.4 cm mass along the right pelvic sidewall may represent locally recurrent disease or malignant lymphadenopathy. 2. No other signs of definite metastatic disease noted elsewhere in the abdomen or pelvis. 3. Stable low-attenuation hepatic lesion in the caudate lobe of the liver, which appears to demonstrates some progressive centripetal filling on delayed images. This lesion is presumably benign given its stability compared to prior studies, and is favored to represent a small cavernous hemangioma. 4. Cardiomegaly with biatrial dilatation, which is very severe on the right side. 5. Aortic atherosclerosis. 6. Additional incidental findings, as above.    07/23/2016 Tumor Marker    Patient's tumor was tested for the following markers: CA-125 Results of the tumor marker test revealed 12.3    09/30/2016 Imaging    Ct abdomen and pelvis: 1. 4.9 cm heterogeneously enhancing mass seen along the right pelvic sidewall previously has almost completely resolved. There is some ill-defined soft tissue attenuation/peritoneal thickening in this region today but no discrete measurable lesion is evident. 2. No new or progressive findings on today's exam. 3. Stable hypo attenuating lesion in the caudate lobe of the liver. 4. Subtle aortic atherosclerosis    09/30/2016 Tumor Marker    Patient's tumor was tested for the following markers: CA-125 Results of the tumor marker test  revealed 9.6    10/05/2016 Pathology Results  Vagina, biopsy, left cuff - BENIGN FIBROEPITHELIAL (STROMAL) POLYP. - NO DYSPLASIA, ATYPIA OR MALIGNANCY IDENTIFIED.    01/14/2017 Tumor Marker    Patient's tumor was tested for the following markers: CA-125 Results of the tumor marker test revealed 11.9    05/05/2017 Tumor Marker    Patient's tumor was tested for the following markers: CA-125 Results of the tumor marker test revealed 28    06/14/2017 Tumor Marker    Patient's tumor was tested for the following markers: CA-125 Results of the tumor marker test revealed 45.7    06/24/2017 Imaging    Ct abdomen and pelvis: Increased peritoneal metastatic disease in abdomen and pelvis since prior exam. No evidence of ascites. Stable small benign hepatic hemangioma.    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 Does of chemo is reduced starting cycle 2 onwards    09/02/2017 Tumor Marker    Patient's tumor was tested for the following markers: CA-125 Results of the tumor marker test revealed 27.7    09/16/2017 Imaging    CT CHEST IMPRESSION  1. No acute process or evidence of metastatic disease in the chest. 2. Right upper lung bronchiectasis, consolidation, and architectural distortion are most likely post infectious or inflammatory and not significantly changed. 3. Aortic Atherosclerosis (ICD10-I70.0).  CT ABDOMEN AND PELVIS IMPRESSION  1. Response to therapy of nodal and peritoneal metastasis. 2. No new or progressive disease. 3. Caudate lobe hemangioma, as before. 4. Bilateral renal cortical thinning. Similar minimal right-sided caliectasis and hydroureter. Likely secondary to low-grade obstruction by the dominant right pelvic implant.     09/22/2017 Tumor Marker    Patient's tumor was tested for the following markers: CA-125 Results of the tumor marker test revealed 18    11/03/2017 Tumor Marker    Patient's tumor was tested for the following markers: CA-125 Results of the tumor marker test revealed 13.4    11/25/2017 Imaging    1. Stable to slight interval decrease in size of nodal and peritoneal metastatic disease within the abdomen. 2. No new or progressive disease.    12/03/2017 -  Chemotherapy    The patient is prescribed Zejula    01/17/2018 Tumor Marker    Patient's tumor was tested for the following markers: CA-125 Results of the tumor marker test revealed 11.9    02/22/2018 Tumor Marker    Patient's tumor was tested for the following markers: CA-125 Results of the tumor marker test revealed 11.7    02/22/2018 Imaging    1. Improved nodular thickening along the vaginal cuff on the left and medial to the right iliac vessels, likely treated implants. No evidence of progressive peritoneal disease. 2. Stable hepatic hemangioma. No evidence of solid visceral organ metastases. 3. Bilateral renal cortical scarring.    05/19/2018 Tumor Marker    Patient's tumor was tested for the following markers: CA-125 Results of the tumor marker test revealed 9.9    08/10/2018 Imaging    Stable mild nodular thickening of the left vaginal cuff. No new or progressive disease within the abdomen or pelvis.  Stable small hepatic hemangioma.    09/21/2018 Tumor Marker    Patient's tumor was tested for the following markers: CA-125 Results of the tumor marker test  revealed 10.3    11/11/2018 Tumor Marker    Patient's tumor was tested for the following markers: CA-125 Results of the tumor marker test revealed 10.5    12/15/2018 Tumor Marker    Patient's tumor was tested for the following markers: CA-125 Results of the tumor marker test revealed 12.6    01/06/2019 Tumor Marker    Patient's tumor was tested for the following  markers: CA-125 Results of the tumor marker test revealed 14.6    01/12/2019 Imaging    1. Chronic extensive right upper lobe scarring changes and bronchiectasis. 2. No mediastinal or hilar mass or adenopathy and no findings for pulmonary metastatic disease. 3. Stable caudate lobe hemangioma. 4. No worrisome omental or peritoneal surface lesions. 5. Stable pericecal and small pelvic lymph nodes. No new or progressive findings.      REVIEW OF SYSTEMS:   Constitutional: Denies fevers, chills or abnormal weight loss Eyes: Denies blurriness of vision Ears, nose, mouth, throat, and face: Denies mucositis or sore throat Respiratory: Denies cough, dyspnea or wheezes Cardiovascular: Denies palpitation, chest discomfort or lower extremity swelling Skin: Denies abnormal skin rashes Lymphatics: Denies new lymphadenopathy or easy bruising Neurological:Denies numbness, tingling or new weaknesses Behavioral/Psych: Mood is stable, no new changes  All other systems were reviewed with the patient and are negative.  I have reviewed the past medical history, past surgical history, social history and family history with the patient and they are unchanged from previous note.  ALLERGIES:  is allergic to codeine; lactulose; and latex.  MEDICATIONS:  Current Outpatient Medications  Medication Sig Dispense Refill  . apixaban (ELIQUIS) 5 MG TABS tablet Take 1 tablet (5 mg total) by mouth 2 (two) times daily. 60 tablet 5  . aspirin EC 81 MG tablet Take 81 mg by mouth daily.    Marland Kitchen atorvastatin (LIPITOR) 40 MG tablet Take 40 mg by mouth at bedtime.    . benzonatate (TESSALON) 100 MG capsule Take 100 mg by mouth 3 (three) times daily.    . diphenhydrAMINE (BENADRYL) 25 MG tablet Take 25 mg by mouth every 6 (six) hours as needed for allergies.    . fluticasone (FLONASE) 50 MCG/ACT nasal spray Place 2 sprays into both nostrils 2 (two) times daily.    Marland Kitchen gabapentin (NEURONTIN) 300 MG capsule TAKE ONE CAPSULE BY  MOUTH TWICE DAILY (Patient not taking: No sig reported) 60 capsule 0  . gabapentin (NEURONTIN) 300 MG capsule TAKE 1 CAPSULE(300 MG) BY MOUTH THREE TIMES DAILY (Patient not taking: No sig reported) 90 capsule 0  . gabapentin (NEURONTIN) 600 MG tablet Take 1 tablet (600 mg total) by mouth 2 (two) times daily. 60 tablet 11  . guaifenesin (ROBITUSSIN) 100 MG/5ML syrup Take 200 mg by mouth 3 (three) times daily as needed for cough.    . lidocaine-prilocaine (EMLA) cream Apply to Porta-Cath 1-2 hours prior to access as directed. (Patient taking differently: Apply 1 application topically as needed (port access). ) 30 g 1  . metoprolol succinate (TOPROL-XL) 25 MG 24 hr tablet Take 25 mg by mouth daily.   5  . Multiple Vitamin (MULTIVITAMIN WITH MINERALS) TABS tablet Take 1 tablet by mouth daily.    Marland Kitchen omega-3 acid ethyl esters (LOVAZA) 1 g capsule Take 1 g by mouth daily.    . ondansetron (ZOFRAN ODT) 4 MG disintegrating tablet Take 1 tablet (4 mg total) by mouth every 8 (eight) hours as needed for nausea or vomiting. 15 tablet 0  . oxyCODONE (OXY IR/ROXICODONE) 5  MG immediate release tablet Take 1 tablet (5 mg total) by mouth every 6 (six) hours as needed for severe pain. 90 tablet 0  . PARoxetine (PAXIL) 20 MG tablet TAKE 1 TABLET BY MOUTH DAILY (Patient not taking: No sig reported) 90 tablet 0  . PARoxetine (PAXIL) 40 MG tablet Take 40 mg by mouth daily.  0  . polyethylene glycol (MIRALAX / GLYCOLAX) packet Take 17 g by mouth daily.    . verapamil (CALAN-SR) 240 MG CR tablet Take 1 tablet (240 mg total) by mouth daily. 30 tablet 0  . ZEJULA 100 MG CAPS TAKE 3 CAPSULES (300MG) BY MOUTH DAILY (Patient taking differently: Take 300 mg by mouth daily. ) 90 capsule 11   No current facility-administered medications for this visit.     PHYSICAL EXAMINATION: ECOG PERFORMANCE STATUS: 2 - Symptomatic, <50% confined to bed  Vitals:   01/13/19 1052  BP: 134/81  Pulse: 67  Resp: 18  Temp: 98.1 F (36.7 C)   SpO2: 100%   Filed Weights   01/13/19 1052  Weight: 200 lb 12.8 oz (91.1 kg)    GENERAL:alert, no distress and comfortable Musculoskeletal:no cyanosis of digits and no clubbing  NEURO: alert & oriented x 3 with fluent speech, no focal motor/sensory deficits  LABORATORY DATA:  I have reviewed the data as listed    Component Value Date/Time   NA 143 01/06/2019 1018   NA 142 12/08/2017 1051   K 3.8 01/06/2019 1018   K 4.4 12/08/2017 1051   CL 106 01/06/2019 1018   CL 105 06/14/2017 1430   CO2 27 01/06/2019 1018   CO2 28 12/08/2017 1051   GLUCOSE 128 (H) 01/06/2019 1018   GLUCOSE 97 12/08/2017 1051   BUN 9 01/06/2019 1018   BUN 16.2 12/08/2017 1051   CREATININE 0.85 01/06/2019 1018   CREATININE 0.9 12/08/2017 1051   CALCIUM 9.8 01/06/2019 1018   CALCIUM 9.5 12/08/2017 1051   PROT 7.3 01/06/2019 1018   PROT 6.9 12/08/2017 1051   ALBUMIN 3.4 (L) 01/06/2019 1018   ALBUMIN 3.6 12/08/2017 1051   AST 19 01/06/2019 1018   AST 18 12/08/2017 1051   ALT 19 01/06/2019 1018   ALT 20 12/08/2017 1051   ALKPHOS 176 (H) 01/06/2019 1018   ALKPHOS 131 12/08/2017 1051   BILITOT 1.1 01/06/2019 1018   BILITOT 0.70 12/08/2017 1051   GFRNONAA >60 01/06/2019 1018   GFRAA >60 01/06/2019 1018    No results found for: SPEP, UPEP  Lab Results  Component Value Date   WBC 4.7 01/06/2019   NEUTROABS 3.2 01/06/2019   HGB 9.9 (L) 01/06/2019   HCT 29.7 (L) 01/06/2019   MCV 120.2 (H) 01/06/2019   PLT 238 01/06/2019      Chemistry      Component Value Date/Time   NA 143 01/06/2019 1018   NA 142 12/08/2017 1051   K 3.8 01/06/2019 1018   K 4.4 12/08/2017 1051   CL 106 01/06/2019 1018   CL 105 06/14/2017 1430   CO2 27 01/06/2019 1018   CO2 28 12/08/2017 1051   BUN 9 01/06/2019 1018   BUN 16.2 12/08/2017 1051   CREATININE 0.85 01/06/2019 1018   CREATININE 0.9 12/08/2017 1051      Component Value Date/Time   CALCIUM 9.8 01/06/2019 1018   CALCIUM 9.5 12/08/2017 1051   ALKPHOS 176  (H) 01/06/2019 1018   ALKPHOS 131 12/08/2017 1051   AST 19 01/06/2019 1018   AST 18 12/08/2017 1051  ALT 19 01/06/2019 1018   ALT 20 12/08/2017 1051   BILITOT 1.1 01/06/2019 1018   BILITOT 0.70 12/08/2017 1051       RADIOGRAPHIC STUDIES: I have reviewed CT imaging with the patient and family I have personally reviewed the radiological images as listed and agreed with the findings in the report. Ct Chest W Contrast  Result Date: 01/12/2019 CLINICAL DATA:  Abdominal pain. History of ovarian cancer. Remote history of breast cancer. EXAM: CT CHEST, ABDOMEN, AND PELVIS WITH CONTRAST TECHNIQUE: Multidetector CT imaging of the chest, abdomen and pelvis was performed following the standard protocol during bolus administration of intravenous contrast. CONTRAST:  144m OMNIPAQUE IOHEXOL 300 MG/ML  SOLN COMPARISON:  CT scan 09/16/2017 and 08/10/2018. FINDINGS: CT CHEST FINDINGS Cardiovascular: The heart is normal in size and stable. No pericardial effusion. Stable mild tortuosity and ectasia of the thoracic aorta but no focal aneurysm or dissection. The branch vessels are patent. No definite coronary artery calcifications. Mediastinum/Nodes: No mediastinal or hilar mass or lymphadenopathy. Stable small scattered lymph nodes. Stable bilateral thyroid nodules. Lungs/Pleura: Stable extensive scarring changes involving the right upper lobe with loss of volume, bronchiectasis and pleural thickening. No worrisome pulmonary lesions. This is likely postinfectious change. No pulmonary nodules to suggest pulmonary metastatic disease. No acute pulmonary findings. Musculoskeletal: Stable appearance of the left breast with surgical changes. No chest wall tumor or axillary adenopathy. The small rounded density in the left lateral breast is unchanged and may be an intramammary lymph node. No right-sided breast mass. The right-sided Port-A-Cath is stable. No significant bony findings. CT ABDOMEN PELVIS FINDINGS Hepatobiliary:  Stable caudate lesion consistent with benign hemangioma. No hepatic lesions or peritoneal surface disease. No intra or extrahepatic biliary dilatation. The gallbladder appears normal. Pancreas: No mass, inflammation or ductal dilatation. Spleen: Normal size.  No focal lesions. Adrenals/Urinary Tract: The adrenal glands are normal in stable. Stable scarring changes involving both kidneys no worrisome renal lesions or hydronephrosis. The bladder appears normal. Stomach/Bowel: The stomach, duodenum, small bowel and colon are unremarkable. No acute inflammatory changes, mass lesions or obstructive findings. The terminal ileum is normal. Vascular/Lymphatic: The aorta and branch vessels are patent. The major venous structures are patent. Small scattered mesenteric and retroperitoneal lymph nodes but no mass or adenopathy. No omental or peritoneal surface lesions are demonstrated. There is a small pericecal nodal lesion measuring 9.5 mm on image number 95. A few small scattered operator and external iliac lymph nodes are stable. Reproductive: Surgically absent. Other: No inguinal mass or adenopathy. No inguinal or abdominal wall hernia. Musculoskeletal: No significant bony findings. IMPRESSION: 1. Chronic extensive right upper lobe scarring changes and bronchiectasis. 2. No mediastinal or hilar mass or adenopathy and no findings for pulmonary metastatic disease. 3. Stable caudate lobe hemangioma. 4. No worrisome omental or peritoneal surface lesions. 5. Stable pericecal and small pelvic lymph nodes. No new or progressive findings. Electronically Signed   By: PMarijo SanesM.D.   On: 01/12/2019 15:47   Ct Abdomen Pelvis W Contrast  Result Date: 01/12/2019 CLINICAL DATA:  Abdominal pain. History of ovarian cancer. Remote history of breast cancer. EXAM: CT CHEST, ABDOMEN, AND PELVIS WITH CONTRAST TECHNIQUE: Multidetector CT imaging of the chest, abdomen and pelvis was performed following the standard protocol during bolus  administration of intravenous contrast. CONTRAST:  1039mOMNIPAQUE IOHEXOL 300 MG/ML  SOLN COMPARISON:  CT scan 09/16/2017 and 08/10/2018. FINDINGS: CT CHEST FINDINGS Cardiovascular: The heart is normal in size and stable. No pericardial effusion. Stable mild tortuosity  and ectasia of the thoracic aorta but no focal aneurysm or dissection. The branch vessels are patent. No definite coronary artery calcifications. Mediastinum/Nodes: No mediastinal or hilar mass or lymphadenopathy. Stable small scattered lymph nodes. Stable bilateral thyroid nodules. Lungs/Pleura: Stable extensive scarring changes involving the right upper lobe with loss of volume, bronchiectasis and pleural thickening. No worrisome pulmonary lesions. This is likely postinfectious change. No pulmonary nodules to suggest pulmonary metastatic disease. No acute pulmonary findings. Musculoskeletal: Stable appearance of the left breast with surgical changes. No chest wall tumor or axillary adenopathy. The small rounded density in the left lateral breast is unchanged and may be an intramammary lymph node. No right-sided breast mass. The right-sided Port-A-Cath is stable. No significant bony findings. CT ABDOMEN PELVIS FINDINGS Hepatobiliary: Stable caudate lesion consistent with benign hemangioma. No hepatic lesions or peritoneal surface disease. No intra or extrahepatic biliary dilatation. The gallbladder appears normal. Pancreas: No mass, inflammation or ductal dilatation. Spleen: Normal size.  No focal lesions. Adrenals/Urinary Tract: The adrenal glands are normal in stable. Stable scarring changes involving both kidneys no worrisome renal lesions or hydronephrosis. The bladder appears normal. Stomach/Bowel: The stomach, duodenum, small bowel and colon are unremarkable. No acute inflammatory changes, mass lesions or obstructive findings. The terminal ileum is normal. Vascular/Lymphatic: The aorta and branch vessels are patent. The major venous structures  are patent. Small scattered mesenteric and retroperitoneal lymph nodes but no mass or adenopathy. No omental or peritoneal surface lesions are demonstrated. There is a small pericecal nodal lesion measuring 9.5 mm on image number 95. A few small scattered operator and external iliac lymph nodes are stable. Reproductive: Surgically absent. Other: No inguinal mass or adenopathy. No inguinal or abdominal wall hernia. Musculoskeletal: No significant bony findings. IMPRESSION: 1. Chronic extensive right upper lobe scarring changes and bronchiectasis. 2. No mediastinal or hilar mass or adenopathy and no findings for pulmonary metastatic disease. 3. Stable caudate lobe hemangioma. 4. No worrisome omental or peritoneal surface lesions. 5. Stable pericecal and small pelvic lymph nodes. No new or progressive findings. Electronically Signed   By: Marijo Sanes M.D.   On: 01/12/2019 15:47    All questions were answered. The patient knows to call the clinic with any problems, questions or concerns. No barriers to learning was detected.  I spent 25 minutes counseling the patient face to face. The total time spent in the appointment was 30 minutes and more than 50% was on counseling and review of test results  Heath Lark, MD 01/13/2019 12:25 PM

## 2019-01-27 ENCOUNTER — Inpatient Hospital Stay: Payer: Medicare Other

## 2019-02-09 ENCOUNTER — Telehealth: Payer: Self-pay

## 2019-02-09 NOTE — Telephone Encounter (Signed)
Oral Oncology Patient Advocate Encounter  [place] has attempted to reach the patient 3 times to get her Zejula refilled with no success. I spoke to the patients spouse, McCary and she stated that the patient has 240 capsules on hand at the moment this would be 80 days on hand. I will retime her refill call. I also made oral oncology pharmacist aware and she will look into this.  [facility] [place] Phone [phone] Fax [phone] 02/09/2019   3:48 PM

## 2019-02-10 ENCOUNTER — Telehealth: Payer: Self-pay

## 2019-02-10 NOTE — Telephone Encounter (Signed)
Can you call?

## 2019-02-10 NOTE — Telephone Encounter (Signed)
Spoke with pt by phone.  She states she has 80 days of Zejula on hand.  She says her husband helps her with her medications.  I asked to speak with husband.  She said he was at the store.  I called and spoke with the daughter.  She is going to verify how much Zejula her mother currently has on hand and call me back.

## 2019-02-10 NOTE — Telephone Encounter (Signed)
Hi Brenda Cunningham,  Please call husband or daughter (she has stroke and dysphasia). Can you verify that she is taking her Zejula?

## 2019-02-22 ENCOUNTER — Telehealth: Payer: Medicare Other | Admitting: Family

## 2019-02-22 DIAGNOSIS — R05 Cough: Secondary | ICD-10-CM

## 2019-02-22 DIAGNOSIS — R059 Cough, unspecified: Secondary | ICD-10-CM

## 2019-02-22 MED ORDER — AZITHROMYCIN 250 MG PO TABS: ORAL_TABLET | ORAL | 0 refills | Status: DC

## 2019-02-22 MED ORDER — BENZONATATE 100 MG PO CAPS: 100.0000 mg | ORAL_CAPSULE | Freq: Three times a day (TID) | ORAL | 0 refills | Status: DC | PRN

## 2019-02-22 NOTE — Progress Notes (Signed)
We are sorry that you are not feeling well.  Here is how we plan to help!  Based on your presentation I believe you most likely have A cough due to bacteria.  When patients have a fever and a productive cough with a change in color or increased sputum production, we are concerned about bacterial bronchitis.  If left untreated it can progress to pneumonia.  If your symptoms do not improve with your treatment plan it is important that you contact your provider.   I have prescribed Azithromyin 250 mg: two tablets now and then one tablet daily for 4 additonal days    In addition you may use A non-prescription cough medication called Robitussin DAC. Take 2 teaspoons every 8 hours or Delsym: take 2 teaspoons every 12 hours., A non-prescription cough medication called Mucinex DM: take 2 tablets every 12 hours. and A prescription cough medication called Tessalon Perles 100mg . You may take 1-2 capsules every 8 hours as needed for your cough.  Given age and chronic medical problems, I will go ahead and send in [place].   Approximately 5 minutes was spent documenting and reviewing patient's chart.    From your responses in the eVisit questionnaire you describe inflammation in the upper respiratory tract which is causing a significant cough.  This is commonly called Bronchitis and has four common causes:    Allergies  Viral Infections  Acid Reflux  Bacterial Infection Allergies, viruses and acid reflux are treated by controlling symptoms or eliminating the cause. An example might be a cough caused by taking certain blood pressure medications. You stop the cough by changing the medication. Another example might be a cough caused by acid reflux. Controlling the reflux helps control the cough.  USE OF BRONCHODILATOR ("RESCUE") INHALERS: There is a risk from using your bronchodilator too frequently.  The risk is that over-reliance on a medication which only relaxes the muscles surrounding the breathing tubes can  reduce the effectiveness of medications prescribed to reduce swelling and congestion of the tubes themselves.  Although you feel brief relief from the bronchodilator inhaler, your asthma may actually be worsening with the tubes becoming more swollen and filled with mucus.  This can delay other crucial treatments, such as oral steroid medications. If you need to use a bronchodilator inhaler daily, several times per day, you should discuss this with your provider.  There are probably better treatments that could be used to keep your asthma under control.     HOME CARE . Only take medications as instructed by your medical team. . Complete the entire course of an antibiotic. . Drink plenty of fluids and get plenty of rest. . Avoid close contacts especially the very young and the elderly . Cover your mouth if you cough or cough into your sleeve. . Always remember to wash your hands . A steam or ultrasonic humidifier can help congestion.   GET HELP RIGHT AWAY IF: . You develop worsening fever. . You become short of breath . You cough up blood. . Your symptoms persist after you have completed your treatment plan MAKE SURE YOU   Understand these instructions.  Will watch your condition.  Will get help right away if you are not doing well or get worse.  Your e-visit answers were reviewed by a board certified advanced clinical practitioner to complete your personal care plan.  Depending on the condition, your plan could have included both over the counter or prescription medications. If there is a problem please reply  once you have received a response from your provider. Your safety is important to [place].  If you have drug allergies check your prescription carefully.    You can use MyChart to ask questions about today's visit, request a non-urgent call back, or ask for a work or school excuse for 24 hours related to this e-Visit. If it has been greater than 24 hours you will need to follow up with  your provider, or enter a new e-Visit to address those concerns. You will get an e-mail in the next two days asking about your experience.  I hope that your e-visit has been valuable and will speed your recovery. Thank you for using e-visits.

## 2019-03-10 ENCOUNTER — Inpatient Hospital Stay: Payer: Self-pay | Attending: Hematology and Oncology

## 2019-03-10 ENCOUNTER — Other Ambulatory Visit: Payer: Medicare Other

## 2019-03-28 ENCOUNTER — Other Ambulatory Visit: Payer: Self-pay

## 2019-03-28 ENCOUNTER — Encounter: Payer: Self-pay | Admitting: Neurology

## 2019-03-28 ENCOUNTER — Telehealth (INDEPENDENT_AMBULATORY_CARE_PROVIDER_SITE_OTHER): Payer: Self-pay | Admitting: Neurology

## 2019-03-28 VITALS — BP 121/73 | HR 61 | Temp 97.6°F | Ht 70.0 in | Wt 203.0 lb

## 2019-03-28 DIAGNOSIS — Z8673 Personal history of transient ischemic attack (TIA), and cerebral infarction without residual deficits: Secondary | ICD-10-CM

## 2019-03-28 DIAGNOSIS — G40209 Localization-related (focal) (partial) symptomatic epilepsy and epileptic syndromes with complex partial seizures, not intractable, without status epilepticus: Secondary | ICD-10-CM

## 2019-03-28 NOTE — Progress Notes (Signed)
Virtual Visit via Video Note The purpose of this virtual visit is to provide medical care while limiting exposure to the novel coronavirus.    Consent was obtained for video visit:  Yes.   Answered questions that patient had about telehealth interaction:  Yes.   I discussed the limitations, risks, security and privacy concerns of performing an evaluation and management service by telemedicine. I also discussed with the patient that there may be a patient responsible charge related to this service. The patient expressed understanding and agreed to proceed.  Pt location: Home Physician Location: office Name of referring provider:  Helane Rima, MD I connected with Consuello Masse at patients initiation/request on 03/28/2019 at 11:30 AM EDT by video enabled telemedicine application and verified that I am speaking with the correct person using two identifiers. Pt MRN:  [id number] Pt DOB:  1951/05/01 Video Participants:  Consuello Masse;  Stacie Acres (daughter)   History of Present Illness:  The patient was last seen in April 2019 for stroke and seizure follow-up. Her daughter is present during today's e-visit to provide additional information. Since her last visit, she and her daughter deny any further episodes of left-sided shaking or decreased responsiveness since March 2017. 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 She has residual expressive aphasia and right hemiparesis, with no worsening of symptoms in the past year. She had swallowing difficulties and underwent esophageal dilation in February, swallowing is better. She has been off aspirin since then as well and continues on daily Eliquis. She has a little dizziness when she gets out of bed. She fell the other night trying to get in bed, no injuries. She has overall been doing pretty good per patient and family, she is in good spirits.   History on Initial Assessment 04/01/2018: This is a pleasant 68 year old  left-handed woman with a history of hypertension, hyperlipidemia, breast and ovarian cancer, atrial fibrillation, and stroke in 2016, presenting to establish post-stroke care. She was admitted in August 2016 for inability to speak, right facial droop, and right-sided weakness. She was also found to have a left gaze preference. She was outside the TPA window and was also anticoagulated on Xarelto for atrial fibrillation. Brain MRI demonstrated an acute infarct involving the left middle cerebral artery territory, mostly involving the insula and frontal lobe with a small amount of involvement of the temporal and parietal lobe. Her CTA showed an embolus occluding the left middle cerebral artery M1 segment, she subsequently underwent a mechanical embolectomy with restoration of normal flow. She has residual right hemiparesis, mostly affecting distal muscles, and mild expressive aphasia. She was brought to [facility] ER in February 2017 and March 2017 for left hand/head shaking, left foot jerking with decreased responsiveness lasting about 2-3 minutes. No post-event confusion. Repeat MRI brain showed remote large left MCA infarct with global atrophy, no acute changes. She attributed the first event to dehydration. She states today that it was very hot and she recalls taking her coat off and starting shaking saying "I'm too hot." Her significant other denies any further similar spells since then. 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 She reports that she is doing fine. She denies any focal numbness/tingling. She has low back pain. She denies any headaches, dizziness, diplopia, dysphagia, neck pain, bladder dysfunction. She has occasional constipation. She has noticed swelling in her right leg.    Observations/Objective:   Vitals:   03/28/19 1101  BP: 121/73  Pulse: 61  Temp:  97.6 F (36.4 C)  Weight: 203 lb (92.1 kg)  Height: 5\' 10"  (1.778 m)   Patient is awake, alert, oriented x 3.  She has expressive aphasia with paraphasic errors but able to answer questions appropriately. She is able to name pen, difficulty naming "paperclip," saying "paper." Able to repeat. Cranial nerves: Extraocular movements intact with no nystagmus. No facial asymmetry. Motor: moves all extremities at least antigravity with right hand spastic contracture. No incoordination on finger to nose testing. Gait: slow and cautious with right leg circumduction.   Assessment and Plan:   This is a pleasant 68 yo LH woman with a history of hypertension, hyperlipidemia, breast and ovarian cancer, atrial fibrillation, and left MCA stroke in 2016 secondary to atrial fibrillation, post-stroke seizures in 2017. She and daughter deny any further episodes of jerking/decreased responsiveness. She is on gabapentin for chemotherapy-related neuropathy. Continue secondary stroke prevention, she is on Eliquis, BP today 121/73. She knows to go to the ER for any sudden change in symptoms. Her daughter has been helping at home with current Covid-19 pandemic, FMLA forms will be filled out for daughter. Follow-up in 1 year, they know to call for any changes.  Follow Up Instructions:   -I discussed the assessment and treatment plan with the patient/daughter. The patient was provided an opportunity to ask questions and all were answered. The patient agreed with the plan and demonstrated an understanding of the instructions.   The patient was advised to call back or seek an in-person evaluation if the symptoms worsen or if the condition fails to improve as anticipated.    Total Time spent in visit with the patient was 20 minutes, of which more than 50% of the time was spent in counseling and/or coordinating care on the above.   Pt understands and agrees with the plan of care outlined.     Cameron Sprang, MD

## 2019-03-31 ENCOUNTER — Ambulatory Visit: Payer: Medicare Other | Admitting: Neurology

## 2019-04-03 ENCOUNTER — Ambulatory Visit: Payer: Medicare Other | Admitting: Neurology

## 2019-04-13 ENCOUNTER — Encounter: Payer: Self-pay | Admitting: Hematology and Oncology

## 2019-04-17 ENCOUNTER — Other Ambulatory Visit: Payer: Self-pay | Admitting: Hematology and Oncology

## 2019-04-17 DIAGNOSIS — C562 Malignant neoplasm of left ovary: Secondary | ICD-10-CM

## 2019-04-20 MED FILL — ZEJULA 100 MG CAPS: 100 | 30 days supply | Qty: 90 | Fill #4

## 2019-04-21 ENCOUNTER — Inpatient Hospital Stay: Payer: Medicare Other | Attending: Hematology and Oncology

## 2019-04-21 ENCOUNTER — Inpatient Hospital Stay: Payer: Medicare Other | Admitting: Hematology and Oncology

## 2019-04-21 ENCOUNTER — Inpatient Hospital Stay: Payer: Medicare Other

## 2019-04-26 ENCOUNTER — Telehealth: Payer: Self-pay | Admitting: Hematology and Oncology

## 2019-04-26 NOTE — Telephone Encounter (Signed)
Spoke with patient re 6/15 appointments.

## 2019-05-17 MED FILL — ZEJULA 100 MG CAPS: 100 | 30 days supply | Qty: 90 | Fill #5

## 2019-05-22 ENCOUNTER — Inpatient Hospital Stay: Payer: Medicare Other | Attending: Hematology and Oncology | Admitting: Hematology and Oncology

## 2019-05-22 ENCOUNTER — Other Ambulatory Visit: Payer: Self-pay

## 2019-05-22 ENCOUNTER — Inpatient Hospital Stay: Payer: Medicare Other

## 2019-05-22 DIAGNOSIS — Z90710 Acquired absence of both cervix and uterus: Secondary | ICD-10-CM | POA: Insufficient documentation

## 2019-05-22 DIAGNOSIS — R110 Nausea: Secondary | ICD-10-CM | POA: Diagnosis not present

## 2019-05-22 DIAGNOSIS — D638 Anemia in other chronic diseases classified elsewhere: Secondary | ICD-10-CM | POA: Diagnosis not present

## 2019-05-22 DIAGNOSIS — C562 Malignant neoplasm of left ovary: Secondary | ICD-10-CM | POA: Diagnosis not present

## 2019-05-22 DIAGNOSIS — Z9221 Personal history of antineoplastic chemotherapy: Secondary | ICD-10-CM | POA: Insufficient documentation

## 2019-05-22 DIAGNOSIS — Z7901 Long term (current) use of anticoagulants: Secondary | ICD-10-CM | POA: Insufficient documentation

## 2019-05-22 DIAGNOSIS — Z79899 Other long term (current) drug therapy: Secondary | ICD-10-CM | POA: Insufficient documentation

## 2019-05-22 DIAGNOSIS — C786 Secondary malignant neoplasm of retroperitoneum and peritoneum: Secondary | ICD-10-CM | POA: Diagnosis not present

## 2019-05-22 DIAGNOSIS — Z8673 Personal history of transient ischemic attack (TIA), and cerebral infarction without residual deficits: Secondary | ICD-10-CM

## 2019-05-22 DIAGNOSIS — Z90722 Acquired absence of ovaries, bilateral: Secondary | ICD-10-CM | POA: Diagnosis not present

## 2019-05-22 DIAGNOSIS — I700 Atherosclerosis of aorta: Secondary | ICD-10-CM | POA: Diagnosis not present

## 2019-05-22 DIAGNOSIS — J3489 Other specified disorders of nose and nasal sinuses: Secondary | ICD-10-CM | POA: Insufficient documentation

## 2019-05-22 DIAGNOSIS — I482 Chronic atrial fibrillation, unspecified: Secondary | ICD-10-CM | POA: Insufficient documentation

## 2019-05-22 DIAGNOSIS — C561 Malignant neoplasm of right ovary: Secondary | ICD-10-CM

## 2019-05-22 DIAGNOSIS — Z95828 Presence of other vascular implants and grafts: Secondary | ICD-10-CM

## 2019-05-22 DIAGNOSIS — C563 Malignant neoplasm of bilateral ovaries: Secondary | ICD-10-CM

## 2019-05-22 LAB — CMP (CANCER CENTER ONLY)
ALT: 23 U/L (ref 0–44)
AST: 21 U/L (ref 15–41)
Albumin: 3.6 g/dL (ref 3.5–5.0)
Alkaline Phosphatase: 177 U/L — ABNORMAL HIGH (ref 38–126)
Anion gap: 9 (ref 5–15)
BUN: 11 mg/dL (ref 8–23)
CO2: 25 mmol/L (ref 22–32)
Calcium: 9.3 mg/dL (ref 8.9–10.3)
Chloride: 105 mmol/L (ref 98–111)
Creatinine: 0.89 mg/dL (ref 0.44–1.00)
GFR, Est AFR Am: >60 mL/min (ref >60)
GFR, Est Non Af Am: >60 mL/min (ref >60)
Glucose, Bld: 101 mg/dL — ABNORMAL HIGH (ref 70–99)
Potassium: 4.3 mmol/L (ref 3.5–5.1)
Sodium: 139 mmol/L (ref 135–145)
Total Bilirubin: 0.5 mg/dL (ref 0.3–1.2)
Total Protein: 7.2 g/dL (ref 6.5–8.1)

## 2019-05-22 LAB — CBC WITH DIFFERENTIAL/PLATELET
Abs Immature Granulocytes: 0.03 10*3/uL (ref 0.00–0.07)
Basophils Absolute: 0 10*3/uL (ref 0.0–0.1)
Basophils Relative: 0 %
Eosinophils Absolute: 0 10*3/uL (ref 0.0–0.5)
Eosinophils Relative: 1 %
HCT: 32.4 % — ABNORMAL LOW (ref 36.0–46.0)
Hemoglobin: 11 g/dL — ABNORMAL LOW (ref 12.0–15.0)
Immature Granulocytes: 1 %
Lymphocytes Relative: 25 %
Lymphs Abs: 1.4 10*3/uL (ref 0.7–4.0)
MCH: 42.1 pg — ABNORMAL HIGH (ref 26.0–34.0)
MCHC: 34 g/dL (ref 30.0–36.0)
MCV: 124.1 fL — ABNORMAL HIGH (ref 80.0–100.0)
Monocytes Absolute: 0.6 10*3/uL (ref 0.1–1.0)
Monocytes Relative: 11 %
Neutro Abs: 3.7 10*3/uL (ref 1.7–7.7)
Neutrophils Relative %: 62 %
Platelets: 225 10*3/uL (ref 150–400)
RBC: 2.61 MIL/uL — ABNORMAL LOW (ref 3.87–5.11)
RDW: 15.9 % — ABNORMAL HIGH (ref 11.5–15.5)
WBC: 5.8 10*3/uL (ref 4.0–10.5)
nRBC: 1.4 % — ABNORMAL HIGH (ref 0.0–0.2)

## 2019-05-22 MED ORDER — HEPARIN SOD (PORK) LOCK FLUSH 100 UNIT/ML IV SOLN
500.0000 [IU] | Freq: Once | INTRAVENOUS | Status: AC
  Administered 2019-05-22: 500 [IU]
  Filled 2019-05-22: qty 5

## 2019-05-22 MED ORDER — ONDANSETRON 4 MG PO TBDP: 4.0000 mg | ORAL_TABLET | Freq: Three times a day (TID) | ORAL | 0 refills | Status: DC | PRN

## 2019-05-22 MED ORDER — SODIUM CHLORIDE 0.9% FLUSH
10.0000 mL | Freq: Once | INTRAVENOUS | Status: AC
  Administered 2019-05-22: 10 mL
  Filled 2019-05-22: qty 10

## 2019-05-23 ENCOUNTER — Telehealth: Payer: Self-pay | Admitting: Hematology and Oncology

## 2019-05-23 ENCOUNTER — Encounter: Payer: Self-pay | Admitting: Hematology and Oncology

## 2019-05-23 LAB — CA 125: Cancer Antigen (CA) 125: 20.7 U/mL (ref 0.0–38.1)

## 2019-05-23 NOTE — Telephone Encounter (Signed)
I talk with patient regarding schedule  

## 2019-05-23 NOTE — Assessment & Plan Note (Signed)
She is on chronic anticoagulation therapy for history of stroke She denies recent bleeding

## 2019-05-23 NOTE — Progress Notes (Signed)
[place] OFFICE PROGRESS NOTE  Patient Care Team: Helane Rima, MD as PCP - General (Family Medicine) Encarnacion Slates, MD as Referring Physician (Neurology) Cameron Sprang, MD as Consulting Physician (Neurology)  ASSESSMENT & PLAN:  Ovarian cancer [facility]) She has no signs of active cancer; her tumor marker is stable She will continue niraparib indefinitely I plan to see her again in 3 months  Anemia, chronic disease She has chronic anemia, likely due to side effects of treatment She is not symptomatic Observe only.  Chronic atrial fibrillation She is on chronic anticoagulation therapy for history of stroke She denies recent bleeding   No orders of the defined types were placed in this encounter.   INTERVAL HISTORY: Please see below for problem oriented charting. She returns for further follow-up She is doing well Denies recent changes in bowel habits She have rare occasional intermittent nausea The patient denies any recent signs or symptoms of bleeding such as spontaneous epistaxis, hematuria or hematochezia. Her appetite is stable Her weight is stable  SUMMARY OF ONCOLOGIC HISTORY: Oncology History Overview Note  Negative genetic testing   Ovarian cancer ([place])  11/28/2015 Imaging   Ct scan of abdomen: Peritoneal carcinomatosis and pelvic/inguinal adenopathy. Gynecologic primary is favored.   12/17/2015 Pathology Results   Lymph node, needle/core biopsy, L inguinal LAN - METASTATIC ADENOCARCINOMA. Microscopic Comment Immunohistochemistry will be performed and reported as an addendum. (JDP:ecj 12/18/2015) ADDENDUM: Immunohistochemistry shows strong positivity with cytokeratin 7, estrogen receptor (ER), progesterone receptor (PR), p53 and WT1. Negative markers are cytokeratin 20 , CDX- 2 and gross cystic disease fluid protein. The morphology and immunophenotype are most consistent with a high grade gynecologic carcinoma including high grade ovarian serous  carcinoma. (JDP:kh 12-19-15   12/17/2015 Procedure   Technically successful ultrasound guided core left inguinal adenopathy biopsy   12/24/2015 Tumor Marker   Patient's tumor was tested for the following markers: CA-125 Results of the tumor marker test revealed 608.2   12/27/2015 - 02/28/2016 Chemotherapy   She received neoadjuvant chemo x 3 cycles   01/01/2016 Tumor Marker   Patient's tumor was tested for the following markers: CA-125 Results of the tumor marker test revealed 741.6   01/29/2016 Procedure   Successful placement of a right internal jugular approach power injectable Port-A-Cath. The catheter is ready for immediate use   01/29/2016 Imaging   US abdomen 1. Hyperechoic 2.5 x 1.0 x 1.6 cm lesion in the region the caudate lobe of the liver. Similar finding noted on prior CT of 11/28/2015. This could represent hemangioma. This could represent a malignancy including metastatic disease. 2. Exam otherwise unremarkable. No gallstones. No biliary distention.   02/03/2016 Tumor Marker   Patient's tumor was tested for the following markers: CA-125 Results of the tumor marker test revealed 137.1   02/20/2016 Imaging   MRI brain No acute infarct.  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  No intracranial mass.  Moderate small vessel disease changes.  Global atrophy without hydrocephalus.  Expanded partially empty sella without secondary findings of pseudotumor cerebri.  Minimal mucosal thickening ethmoid sinus air cells. Small air-fluid levels maxillary sinuses bilaterally may indicate changes of acute sinusitis.    02/28/2016 Tumor Marker   Patient's tumor was tested for the following markers: CA-125 Results of the tumor marker test revealed 33.6   03/02/2016 Imaging   CT chest, abdomen and pelvis 1. Today's study demonstrates a positive response to therapy with resolution of the previously noted malignant ascites, significant regression of  previously noted peritoneal implants, and regression of previously noted lymphadenopathy in the abdomen and pelvis. 2. No definite evidence to suggest metastatic disease to the thorax. 3. Stable 1.0 x 1.7 cm intermediate attenuation lesion associated with the posterior aspect of segment 1 of the liver, favored to represent a mildly proteinaceous hepatic cyst. The possibility of a peritoneal implant in this region is not entirely excluded, but is not favored on today's examination. 4. Extensive post infectious scarring inguinal upper right lung, likely sequela of prior necrotizing pneumonia. 5. Multiple tiny pulmonary nodules scattered throughout the lungs bilaterally all measuring 4 mm or less. These are nonspecific, but favored to be benign. Given the patient's history of primary gynecologic malignancy, attention on followup studies is recommended to ensure the stability of these findings. 6. Additional incidental findings, as above   03/14/2016 Imaging   CT angiogram 1. No pulmonary emboli. 2. Chronic changes to the right upper lobe. 3. Stable lesion in the caudate lobe of the liver. 4. Stable soft tissue prominence to the right of the trachea as described above. 5. Stable small nodule in the right lung.    03/24/2016 Pathology Results   1. Omentum, resection for tumor - FOCAL ADENOCARCINOMA ASSOCIATED WITH EXTENSIVE FIBROSIS, INFLAMMATION AND HEMOSIDERIN DEPOSITION. 2. Uterus +/- tubes/ovaries, neoplastic, cervix - CERVIX: SLIGHT CERVICITIS AND SQUAMOUS METAPLASIA. - ENDOMETRIUM: ATROPHIC, NO HYPERPLASIA OR MALIGNANCY. - MYOMETRIUM: LEIOMYOMATA WITH DEGENERATIVE CHANGES. NO MALIGNANCY. - UTERINE SEROSA: FOCAL ADENOCARCINOMA ASSOCIATED WITH ADHESIONS. - RIGHT OVARY: BENIGN CALCIFIED NODULE AND BENIGN SEROUS CYST. NO MALIGNANCY IDENTIFIED. - RIGHT FALLOPIAN TUBE: UNREMARKABLE. NO MALIGNANCY IDENTIFIED. - LEFT OVARY: FOCAL ADENOCARCINOMA ASSOCIATED WITH EXTENSIVE FIBROSIS. - LEFT FALLOPIAN  TUBE: HYDROSALPINX. NO MALIGNANCY IDENTIFIED. 3. Lymph node, biopsy, right external iliac - METASTATIC ADENOCARCINOMA, 4. Soft tissue, biopsy, left para colic gutter - FOCAL ADENOCARCINOMA ASSOCIATED WITH FIBROSIS. Microscopic Comment 2. OVARY Specimen(s): Uterus with bilateral ovaries, omentum, right external iliac lymph node and left paracolic gutter. Procedure: (including lymph node sampling) Hysterectomy with bilateral salpingo-oophorectomy, omentectomy, lymph node biopsy and paracolic gutter biopsy. Primary tumor site (including laterality): Left ovary. Ovarian surface involvement: Yes Ovarian capsule intact without fragmentation: N/A Maximum tumor size (cm): 2 cm, 2 cm Histologic type: Serous carcinoma. Grade: 3 Peritoneal implants: (specify invasive or non-invasive): Left paracolic gutter positive for carcinoma Pelvic extension (list additional structures on separate lines and if involved): Omentum, right paracolic gutter and uterine serosa. Lymph nodes: number examined 1 ; number positive 1 TNM code: ypT3a, ypN1b, ypMX FIGO Stage (based on pathologic findings, needs clinical correlation): IIIA2 Comments: The left ovary has a 2 cm nodule, which is composed primarily of fibrous tissue with small foci of residual adenocarcinoma. There is also microscopic involvement of the uterine serosa, the left paracolic gutter biopsy and the omentum. The left paracolic gutter and omental involvement is microscopic and both are associated with extensive fibrosis with focal hemosiderin deposition. The right external iliac node is completely replaced by metastatic adenocarcinoma with serous features. (JDP:ecj 03/30/2016)   03/24/2016 Surgery   Operation: Robotic-assisted laparoscopic total hysterectomy with bilateral salpingoophorectomy, omentectomy, radical tumor debulking  Surgeon: Donaciano Eva  Operative Findings:  : small nodules in omentum, no ascites. Omental nodule (2cm) adherent to  lower anterior abdominal wall. 2cm left colonic gutter cystic nodule. 6cm right external iliac lymph node. Grossly normal ovaries and tubes. Fibroid uterus. No gross residual disease at completion of surgery representing R0 optimal/complete resection.    04/17/2016 Imaging   CT abdomen and pelvis 1. There is  no evidence of acute inflammatory process within abdomen or pelvis. 2. Stable low-density lesion within caudate lobe of the liver. No new hepatic lesions are noted. 3. Again noted lobulated renal contour and multifocal renal cortical scarring. No hydronephrosis or hydroureter. 4. No significant mesenteric adenopathy. No retroperitoneal adenopathy. Stable minimal residual peritoneal thickening within pelvis. No new pelvic implants or pelvic ascites. 5. Status post hysterectomy.  Unremarkable urinary bladder. 6. Moderate stool within colon as described above. No evidence of colitis.    04/24/2016 - 06/19/2016 Chemotherapy   She received 3 more cycles of chemo after surgery   05/14/2016 Tumor Marker   Patient's tumor was tested for the following markers: CA-125 Results of the tumor marker test revealed 22.5   06/04/2016 Tumor Marker   Patient's tumor was tested for the following markers: CA-125 Results of the tumor marker test revealed 13.7   07/07/2016 Genetic Testing   Patient has genetic testing done for breast/ovasrian cancer panel Results revealed patient has no actionable mutation   07/23/2016 Imaging   Ct abdomen and pelvis 1. Heterogeneously enhancing 4.9 x 3.2 x 4.4 cm mass along the right pelvic sidewall may represent locally recurrent disease or malignant lymphadenopathy. 2. No other signs of definite metastatic disease noted elsewhere in the abdomen or pelvis. 3. Stable low-attenuation hepatic lesion in the caudate lobe of the liver, which appears to demonstrates some progressive centripetal filling on delayed images. This lesion is presumably benign given its stability  compared to prior studies, and is favored to represent a small cavernous hemangioma. 4. Cardiomegaly with biatrial dilatation, which is very severe on the right side. 5. Aortic atherosclerosis. 6. Additional incidental findings, as above.   07/23/2016 Tumor Marker   Patient's tumor was tested for the following markers: CA-125 Results of the tumor marker test revealed 12.3   09/30/2016 Imaging   Ct abdomen and pelvis: 1. 4.9 cm heterogeneously enhancing mass seen along the right pelvic sidewall previously has almost completely resolved. There is some ill-defined soft tissue attenuation/peritoneal thickening in this region today but no discrete measurable lesion is evident. 2. No new or progressive findings on today's exam. 3. Stable hypo attenuating lesion in the caudate lobe of the liver. 4. Subtle aortic atherosclerosis   09/30/2016 Tumor Marker   Patient's tumor was tested for the following markers: CA-125 Results of the tumor marker test revealed 9.6   10/05/2016 Pathology Results   Vagina, biopsy, left cuff - BENIGN FIBROEPITHELIAL (STROMAL) POLYP. - NO DYSPLASIA, ATYPIA OR MALIGNANCY IDENTIFIED.   01/14/2017 Tumor Marker   Patient's tumor was tested for the following markers: CA-125 Results of the tumor marker test revealed 11.9   05/05/2017 Tumor Marker   Patient's tumor was tested for the following markers: CA-125 Results of the tumor marker test revealed 28   06/14/2017 Tumor Marker   Patient's tumor was tested for the following markers: CA-125 Results of the tumor marker test revealed 45.7   06/24/2017 Imaging   Ct abdomen and pelvis: Increased peritoneal metastatic disease in abdomen and pelvis since prior exam. No evidence of ascites. Stable small benign hepatic hemangioma.   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 Does of chemo is reduced starting cycle 2 onwards   09/02/2017 Tumor Marker   Patient's  tumor was tested for the following markers: CA-125 Results of the tumor marker test revealed 27.7   09/16/2017 Imaging   CT CHEST IMPRESSION  1. No acute process or evidence of metastatic disease in the chest. 2. Right upper lung bronchiectasis, consolidation, and architectural distortion are most likely post infectious or inflammatory and not significantly changed. 3. Aortic Atherosclerosis (ICD10-I70.0).  CT ABDOMEN AND PELVIS IMPRESSION  1. Response to therapy of nodal and peritoneal metastasis. 2. No new or progressive disease. 3. Caudate lobe hemangioma, as before. 4. Bilateral renal cortical thinning. Similar minimal right-sided caliectasis and hydroureter. Likely secondary to low-grade obstruction by the dominant right pelvic implant.   09/22/2017 Tumor Marker   Patient's tumor was tested for the following markers: CA-125 Results of the tumor marker test revealed 18   11/03/2017 Tumor Marker   Patient's tumor was tested for the following markers: CA-125 Results of the tumor marker test revealed 13.4   11/25/2017 Imaging   1. Stable to slight interval decrease in size of nodal and peritoneal metastatic disease within the abdomen. 2. No new or progressive disease.   12/03/2017 -  Chemotherapy   The patient is prescribed Zejula   01/17/2018 Tumor Marker   Patient's tumor was tested for the following markers: CA-125 Results of the tumor marker test revealed 11.9   02/22/2018 Tumor Marker   Patient's tumor was tested for the following markers: CA-125 Results of the tumor marker test revealed 11.7   02/22/2018 Imaging   1. Improved nodular thickening along the vaginal cuff on the left and medial to the right iliac vessels, likely treated implants. No evidence of progressive peritoneal  disease. 2. Stable hepatic hemangioma. No evidence of solid visceral organ metastases. 3. Bilateral renal cortical scarring.   05/19/2018 Tumor Marker   Patient's tumor was tested for the following markers: CA-125 Results of the tumor marker test revealed 9.9   08/10/2018 Imaging   Stable mild nodular thickening of the left vaginal cuff. No new or progressive disease within the abdomen or pelvis.  Stable small hepatic hemangioma.   09/21/2018 Tumor Marker   Patient's tumor was tested for the following markers: CA-125 Results of the tumor marker test revealed 10.3   11/11/2018 Tumor Marker   Patient's tumor was tested for the following markers: CA-125 Results of the tumor marker test revealed 10.5   12/15/2018 Tumor Marker   Patient's tumor was tested for the following markers: CA-125 Results of the tumor marker test revealed 12.6   01/06/2019 Tumor Marker   Patient's tumor was tested for the following markers: CA-125 Results of the tumor marker test revealed 14.6   01/12/2019 Imaging   1. Chronic extensive right upper lobe scarring changes and bronchiectasis. 2. No mediastinal or hilar mass or adenopathy and no findings for pulmonary metastatic disease. 3. Stable caudate lobe hemangioma. 4. No worrisome omental or peritoneal surface lesions. 5. Stable pericecal and small pelvic lymph nodes. No new or progressive findings.      REVIEW OF SYSTEMS:   Constitutional: Denies fevers, chills or abnormal weight loss Eyes: Denies blurriness of vision Ears, nose, mouth, throat, and face: Denies mucositis or sore throat Respiratory: Denies cough, dyspnea or wheezes Cardiovascular: Denies palpitation, chest discomfort or lower extremity swelling Gastrointestinal:  Denies nausea, heartburn or change in bowel habits Skin: Denies abnormal skin rashes Lymphatics: Denies new lymphadenopathy or easy bruising Neurological:Denies numbness, tingling or new weaknesses Behavioral/Psych: Mood is  stable, no new changes  All other systems were reviewed with the  patient and are negative.  I have reviewed the past medical history, past surgical history, social history and family history with the patient and they are unchanged from previous note.  ALLERGIES:  is allergic to codeine; lactulose; and latex.  MEDICATIONS:  Current Outpatient Medications  Medication Sig Dispense Refill  . apixaban (ELIQUIS) 5 MG TABS tablet Take 1 tablet (5 mg total) by mouth 2 (two) times daily. 60 tablet 5  . atorvastatin (LIPITOR) 40 MG tablet Take 40 mg by mouth at bedtime.    . diphenhydrAMINE (BENADRYL) 25 MG tablet Take 25 mg by mouth every 6 (six) hours as needed for allergies.    . fluticasone (FLONASE) 50 MCG/ACT nasal spray Place 2 sprays into both nostrils 2 (two) times daily.    Marland Kitchen gabapentin (NEURONTIN) 600 MG tablet Take 1 tablet (600 mg total) by mouth 2 (two) times daily. 60 tablet 11  . guaifenesin (ROBITUSSIN) 100 MG/5ML syrup Take 200 mg by mouth 3 (three) times daily as needed for cough.    . lidocaine-prilocaine (EMLA) cream Apply to Porta-Cath 1-2 hours prior to access as directed. (Patient not taking: Reported on 03/28/2019) 30 g 1  . metoprolol succinate (TOPROL-XL) 25 MG 24 hr tablet Take 25 mg by mouth daily.   5  . Multiple Vitamin (MULTIVITAMIN WITH MINERALS) TABS tablet Take 1 tablet by mouth daily.    Marland Kitchen omega-3 acid ethyl esters (LOVAZA) 1 g capsule Take 1 g by mouth daily.    . ondansetron (ZOFRAN ODT) 4 MG disintegrating tablet Take 1 tablet (4 mg total) by mouth every 8 (eight) hours as needed for nausea or vomiting. 60 tablet 0  . oxyCODONE (OXY IR/ROXICODONE) 5 MG immediate release tablet Take 1 tablet (5 mg total) by mouth every 6 (six) hours as needed for severe pain. 90 tablet 0  . PARoxetine (PAXIL) 40 MG tablet Take 40 mg by mouth daily.  0  . polyethylene glycol (MIRALAX / GLYCOLAX) packet Take 17 g by mouth daily.    . verapamil (CALAN-SR) 240 MG CR tablet Take 1 tablet  (240 mg total) by mouth daily. 30 tablet 0  . ZEJULA 100 MG CAPS TAKE 3 CAPSULES (300MG) BY MOUTH DAILY (Patient taking differently: Take 300 mg by mouth daily. ) 90 capsule 11   No current facility-administered medications for this visit.     PHYSICAL EXAMINATION: ECOG PERFORMANCE STATUS: 2 - Symptomatic, <50% confined to bed  Vitals:   05/22/19 1256  BP: 135/70  Pulse: 63  Resp: 18  Temp: (!) 97.3 F (36.3 C)  SpO2: 100%   Filed Weights   05/22/19 1256  Weight: 193 lb 3.2 oz (87.6 kg)    GENERAL:alert, no distress and comfortable SKIN: skin color, texture, turgor are normal, no rashes or significant lesions EYES: normal, Conjunctiva are pink and non-injected, sclera clear OROPHARYNX:no exudate, no erythema and lips, buccal mucosa, and tongue normal  NECK: supple, thyroid normal size, non-tender, without nodularity LYMPH:  no palpable lymphadenopathy in the cervical, axillary or inguinal LUNGS: clear to auscultation and percussion with normal breathing effort HEART: regular rate & rhythm and no murmurs and no lower extremity edema ABDOMEN:abdomen soft, non-tender and normal bowel sounds Musculoskeletal:no cyanosis of digits and no clubbing  NEURO: alert & oriented x 3 with dysarthria and expressive dysphasia and permanent neurological deficit  LABORATORY DATA:  I have reviewed the data as listed    Component Value Date/Time   NA 139 05/22/2019 1152   NA 142 12/08/2017 1051  K 4.3 05/22/2019 1152   K 4.4 12/08/2017 1051   CL 105 05/22/2019 1152   CL 105 06/14/2017 1430   CO2 25 05/22/2019 1152   CO2 28 12/08/2017 1051   GLUCOSE 101 (H) 05/22/2019 1152   GLUCOSE 97 12/08/2017 1051   BUN 11 05/22/2019 1152   BUN 16.2 12/08/2017 1051   CREATININE 0.89 05/22/2019 1152   CREATININE 0.9 12/08/2017 1051   CALCIUM 9.3 05/22/2019 1152   CALCIUM 9.5 12/08/2017 1051   PROT 7.2 05/22/2019 1152   PROT 6.9 12/08/2017 1051   ALBUMIN 3.6 05/22/2019 1152   ALBUMIN 3.6  12/08/2017 1051   AST 21 05/22/2019 1152   AST 18 12/08/2017 1051   ALT 23 05/22/2019 1152   ALT 20 12/08/2017 1051   ALKPHOS 177 (H) 05/22/2019 1152   ALKPHOS 131 12/08/2017 1051   BILITOT 0.5 05/22/2019 1152   BILITOT 0.70 12/08/2017 1051   GFRNONAA >60 05/22/2019 1152   GFRAA >60 05/22/2019 1152    No results found for: SPEP, UPEP  Lab Results  Component Value Date   WBC 5.8 05/22/2019   NEUTROABS 3.7 05/22/2019   HGB 11.0 (L) 05/22/2019   HCT 32.4 (L) 05/22/2019   MCV 124.1 (H) 05/22/2019   PLT 225 05/22/2019      Chemistry      Component Value Date/Time   NA 139 05/22/2019 1152   NA 142 12/08/2017 1051   K 4.3 05/22/2019 1152   K 4.4 12/08/2017 1051   CL 105 05/22/2019 1152   CL 105 06/14/2017 1430   CO2 25 05/22/2019 1152   CO2 28 12/08/2017 1051   BUN 11 05/22/2019 1152   BUN 16.2 12/08/2017 1051   CREATININE 0.89 05/22/2019 1152   CREATININE 0.9 12/08/2017 1051      Component Value Date/Time   CALCIUM 9.3 05/22/2019 1152   CALCIUM 9.5 12/08/2017 1051   ALKPHOS 177 (H) 05/22/2019 1152   ALKPHOS 131 12/08/2017 1051   AST 21 05/22/2019 1152   AST 18 12/08/2017 1051   ALT 23 05/22/2019 1152   ALT 20 12/08/2017 1051   BILITOT 0.5 05/22/2019 1152   BILITOT 0.70 12/08/2017 1051      All questions were answered. The patient knows to call the clinic with any problems, questions or concerns. No barriers to learning was detected.  I spent 15 minutes counseling the patient face to face. The total time spent in the appointment was 20 minutes and more than 50% was on counseling and review of test results  Heath Lark, MD 05/23/2019 8:43 AM

## 2019-05-23 NOTE — Assessment & Plan Note (Signed)
She has no signs of active cancer; her tumor marker is stable She will continue niraparib indefinitely I plan to see her again in 3 months

## 2019-05-23 NOTE — Assessment & Plan Note (Signed)
She has chronic anemia, likely due to side effects of treatment She is not symptomatic Observe only.

## 2019-06-02 ENCOUNTER — Other Ambulatory Visit: Payer: Medicare Other

## 2019-06-13 ENCOUNTER — Other Ambulatory Visit: Payer: Self-pay | Admitting: Nurse Practitioner

## 2019-06-13 DIAGNOSIS — Z1231 Encounter for screening mammogram for malignant neoplasm of breast: Secondary | ICD-10-CM

## 2019-06-13 MED FILL — ZEJULA 100 MG CAPS: 100 | 30 days supply | Qty: 90 | Fill #6

## 2019-06-20 ENCOUNTER — Other Ambulatory Visit: Payer: Self-pay | Admitting: *Deleted

## 2019-06-20 MED ORDER — GABAPENTIN 600 MG PO TABS: 600.0000 mg | ORAL_TABLET | Freq: Two times a day (BID) | ORAL | 11 refills | Status: DC

## 2019-07-10 ENCOUNTER — Other Ambulatory Visit: Payer: Self-pay

## 2019-07-10 ENCOUNTER — Inpatient Hospital Stay: Payer: Medicare Other | Attending: Hematology and Oncology

## 2019-07-10 DIAGNOSIS — Z95828 Presence of other vascular implants and grafts: Secondary | ICD-10-CM

## 2019-07-10 DIAGNOSIS — C562 Malignant neoplasm of left ovary: Secondary | ICD-10-CM | POA: Diagnosis not present

## 2019-07-10 DIAGNOSIS — Z452 Encounter for adjustment and management of vascular access device: Secondary | ICD-10-CM | POA: Insufficient documentation

## 2019-07-10 DIAGNOSIS — C563 Malignant neoplasm of bilateral ovaries: Secondary | ICD-10-CM

## 2019-07-10 DIAGNOSIS — C561 Malignant neoplasm of right ovary: Secondary | ICD-10-CM

## 2019-07-10 MED ORDER — SODIUM CHLORIDE 0.9% FLUSH
10.0000 mL | Freq: Once | INTRAVENOUS | Status: AC
  Administered 2019-07-10: 10 mL
  Filled 2019-07-10: qty 10

## 2019-07-10 MED ORDER — HEPARIN SOD (PORK) LOCK FLUSH 100 UNIT/ML IV SOLN
500.0000 [IU] | Freq: Once | INTRAVENOUS | Status: AC
  Administered 2019-07-10: 500 [IU]
  Filled 2019-07-10: qty 5

## 2019-07-10 MED FILL — ZEJULA 100 MG CAPS: 100 | 30 days supply | Qty: 90 | Fill #7

## 2019-07-28 ENCOUNTER — Ambulatory Visit: Payer: Medicare Other

## 2019-07-28 ENCOUNTER — Ambulatory Visit
Admission: RE | Admit: 2019-07-28 | Discharge: 2019-07-28 | Disposition: A | Discharge status: Home / Self Care | Payer: Medicare Other | Source: Ambulatory Visit | Attending: Nurse Practitioner | Admitting: Nurse Practitioner

## 2019-07-28 ENCOUNTER — Other Ambulatory Visit: Payer: Self-pay

## 2019-07-28 DIAGNOSIS — Z1231 Encounter for screening mammogram for malignant neoplasm of breast: Secondary | ICD-10-CM

## 2019-08-07 MED FILL — ZEJULA 100 MG CAPS: 100 | 30 days supply | Qty: 90 | Fill #8

## 2019-08-22 ENCOUNTER — Inpatient Hospital Stay: Payer: Medicare Other | Admitting: Hematology and Oncology

## 2019-08-22 ENCOUNTER — Inpatient Hospital Stay: Payer: Medicare Other

## 2019-08-22 ENCOUNTER — Inpatient Hospital Stay: Payer: Medicare Other | Attending: Hematology and Oncology

## 2019-08-22 DIAGNOSIS — Z23 Encounter for immunization: Secondary | ICD-10-CM | POA: Insufficient documentation

## 2019-08-22 DIAGNOSIS — D638 Anemia in other chronic diseases classified elsewhere: Secondary | ICD-10-CM | POA: Insufficient documentation

## 2019-08-22 DIAGNOSIS — C562 Malignant neoplasm of left ovary: Secondary | ICD-10-CM | POA: Insufficient documentation

## 2019-08-22 DIAGNOSIS — Z9221 Personal history of antineoplastic chemotherapy: Secondary | ICD-10-CM | POA: Insufficient documentation

## 2019-08-22 DIAGNOSIS — Z7901 Long term (current) use of anticoagulants: Secondary | ICD-10-CM | POA: Insufficient documentation

## 2019-08-22 DIAGNOSIS — K5909 Other constipation: Secondary | ICD-10-CM | POA: Insufficient documentation

## 2019-08-22 DIAGNOSIS — Z79899 Other long term (current) drug therapy: Secondary | ICD-10-CM | POA: Insufficient documentation

## 2019-08-22 DIAGNOSIS — R978 Other abnormal tumor markers: Secondary | ICD-10-CM | POA: Insufficient documentation

## 2019-08-22 DIAGNOSIS — Z8673 Personal history of transient ischemic attack (TIA), and cerebral infarction without residual deficits: Secondary | ICD-10-CM | POA: Insufficient documentation

## 2019-08-22 DIAGNOSIS — I482 Chronic atrial fibrillation, unspecified: Secondary | ICD-10-CM | POA: Insufficient documentation

## 2019-08-22 DIAGNOSIS — Z452 Encounter for adjustment and management of vascular access device: Secondary | ICD-10-CM | POA: Insufficient documentation

## 2019-08-22 DIAGNOSIS — R918 Other nonspecific abnormal finding of lung field: Secondary | ICD-10-CM | POA: Insufficient documentation

## 2019-08-22 DIAGNOSIS — R471 Dysarthria and anarthria: Secondary | ICD-10-CM | POA: Insufficient documentation

## 2019-08-23 ENCOUNTER — Encounter: Payer: Self-pay | Admitting: Hematology and Oncology

## 2019-08-24 ENCOUNTER — Telehealth: Payer: Self-pay | Admitting: Hematology and Oncology

## 2019-08-24 NOTE — Telephone Encounter (Signed)
Patient dtr called to reschedule missed appointment from 9/15. Per dtr eight is too early and patient cannot get up and get going that early. Confirmed with dtr new appointment for 9/28 @ 11:30 am.

## 2019-09-04 ENCOUNTER — Inpatient Hospital Stay: Payer: Medicare Other

## 2019-09-04 ENCOUNTER — Other Ambulatory Visit: Payer: Self-pay

## 2019-09-04 ENCOUNTER — Inpatient Hospital Stay (HOSPITAL_BASED_OUTPATIENT_CLINIC_OR_DEPARTMENT_OTHER): Payer: Medicare Other | Admitting: Hematology and Oncology

## 2019-09-04 VITALS — BP 124/81 | HR 52 | Temp 98.2°F | Resp 18 | Ht 70.0 in | Wt 199.6 lb

## 2019-09-04 DIAGNOSIS — I482 Chronic atrial fibrillation, unspecified: Secondary | ICD-10-CM

## 2019-09-04 DIAGNOSIS — R978 Other abnormal tumor markers: Secondary | ICD-10-CM | POA: Diagnosis not present

## 2019-09-04 DIAGNOSIS — C562 Malignant neoplasm of left ovary: Secondary | ICD-10-CM | POA: Diagnosis present

## 2019-09-04 DIAGNOSIS — Z23 Encounter for immunization: Secondary | ICD-10-CM

## 2019-09-04 DIAGNOSIS — G8191 Hemiplegia, unspecified affecting right dominant side: Secondary | ICD-10-CM

## 2019-09-04 DIAGNOSIS — C561 Malignant neoplasm of right ovary: Secondary | ICD-10-CM

## 2019-09-04 DIAGNOSIS — Z79899 Other long term (current) drug therapy: Secondary | ICD-10-CM | POA: Diagnosis not present

## 2019-09-04 DIAGNOSIS — Z452 Encounter for adjustment and management of vascular access device: Secondary | ICD-10-CM | POA: Diagnosis not present

## 2019-09-04 DIAGNOSIS — D638 Anemia in other chronic diseases classified elsewhere: Secondary | ICD-10-CM

## 2019-09-04 DIAGNOSIS — R471 Dysarthria and anarthria: Secondary | ICD-10-CM | POA: Diagnosis not present

## 2019-09-04 DIAGNOSIS — Z95828 Presence of other vascular implants and grafts: Secondary | ICD-10-CM

## 2019-09-04 DIAGNOSIS — Z8673 Personal history of transient ischemic attack (TIA), and cerebral infarction without residual deficits: Secondary | ICD-10-CM | POA: Diagnosis not present

## 2019-09-04 DIAGNOSIS — Z299 Encounter for prophylactic measures, unspecified: Secondary | ICD-10-CM

## 2019-09-04 DIAGNOSIS — K5909 Other constipation: Secondary | ICD-10-CM | POA: Diagnosis not present

## 2019-09-04 DIAGNOSIS — Z7901 Long term (current) use of anticoagulants: Secondary | ICD-10-CM | POA: Diagnosis not present

## 2019-09-04 DIAGNOSIS — C563 Malignant neoplasm of bilateral ovaries: Secondary | ICD-10-CM

## 2019-09-04 DIAGNOSIS — Z9221 Personal history of antineoplastic chemotherapy: Secondary | ICD-10-CM | POA: Diagnosis not present

## 2019-09-04 DIAGNOSIS — R918 Other nonspecific abnormal finding of lung field: Secondary | ICD-10-CM | POA: Diagnosis not present

## 2019-09-04 LAB — CMP (CANCER CENTER ONLY)
ALT: 17 U/L (ref 0–44)
AST: 19 U/L (ref 15–41)
Albumin: 3.7 g/dL (ref 3.5–5.0)
Alkaline Phosphatase: 152 U/L — ABNORMAL HIGH (ref 38–126)
Anion gap: 8 (ref 5–15)
BUN: 13 mg/dL (ref 8–23)
CO2: 26 mmol/L (ref 22–32)
Calcium: 9.2 mg/dL (ref 8.9–10.3)
Chloride: 104 mmol/L (ref 98–111)
Creatinine: 0.87 mg/dL (ref 0.44–1.00)
GFR, Est AFR Am: >60 mL/min (ref >60)
GFR, Estimated: >60 mL/min (ref >60)
Glucose, Bld: 92 mg/dL (ref 70–99)
Potassium: 3.9 mmol/L (ref 3.5–5.1)
Sodium: 138 mmol/L (ref 135–145)
Total Bilirubin: 0.7 mg/dL (ref 0.3–1.2)
Total Protein: 7 g/dL (ref 6.5–8.1)

## 2019-09-04 LAB — CBC WITH DIFFERENTIAL/PLATELET
Abs Immature Granulocytes: 0.02 10*3/uL (ref 0.00–0.07)
Basophils Absolute: 0 10*3/uL (ref 0.0–0.1)
Basophils Relative: 0 %
Eosinophils Absolute: 0.1 10*3/uL (ref 0.0–0.5)
Eosinophils Relative: 1 %
HCT: 33.1 % — ABNORMAL LOW (ref 36.0–46.0)
Hemoglobin: 11.5 g/dL — ABNORMAL LOW (ref 12.0–15.0)
Immature Granulocytes: 0 %
Lymphocytes Relative: 25 %
Lymphs Abs: 1.5 10*3/uL (ref 0.7–4.0)
MCH: 40.5 pg — ABNORMAL HIGH (ref 26.0–34.0)
MCHC: 34.7 g/dL (ref 30.0–36.0)
MCV: 116.5 fL — ABNORMAL HIGH (ref 80.0–100.0)
Monocytes Absolute: 0.7 10*3/uL (ref 0.1–1.0)
Monocytes Relative: 11 %
Neutro Abs: 3.9 10*3/uL (ref 1.7–7.7)
Neutrophils Relative %: 63 %
Platelets: 196 10*3/uL (ref 150–400)
RBC: 2.84 MIL/uL — ABNORMAL LOW (ref 3.87–5.11)
RDW: 15.7 % — ABNORMAL HIGH (ref 11.5–15.5)
WBC: 6.2 10*3/uL (ref 4.0–10.5)
nRBC: 1.5 % — ABNORMAL HIGH (ref 0.0–0.2)

## 2019-09-04 MED ORDER — HEPARIN SOD (PORK) LOCK FLUSH 100 UNIT/ML IV SOLN
500.0000 [IU] | Freq: Once | INTRAVENOUS | Status: AC
  Administered 2019-09-04: 12:00:00 500 [IU]
  Filled 2019-09-04: qty 5

## 2019-09-04 MED ORDER — ONDANSETRON 4 MG PO TBDP: 4.0000 mg | ORAL_TABLET | Freq: Three times a day (TID) | ORAL | 9 refills | Status: DC | PRN

## 2019-09-04 MED ORDER — SODIUM CHLORIDE 0.9% FLUSH
10.0000 mL | Freq: Once | INTRAVENOUS | Status: AC
  Administered 2019-09-04: 10 mL
  Filled 2019-09-04: qty 10

## 2019-09-04 MED ORDER — INFLUENZA VAC A&B SA ADJ QUAD 0.5 ML IM PRSY
PREFILLED_SYRINGE | INTRAMUSCULAR | Status: AC
  Filled 2019-09-04: qty 0.5

## 2019-09-04 MED ORDER — INFLUENZA VAC A&B SA ADJ QUAD 0.5 ML IM PRSY
0.5000 mL | PREFILLED_SYRINGE | Freq: Once | INTRAMUSCULAR | Status: AC
  Administered 2019-09-04: 13:00:00 0.5 mL via INTRAMUSCULAR

## 2019-09-04 MED FILL — ZEJULA 100 MG CAPS: 100 | 30 days supply | Qty: 90 | Fill #9

## 2019-09-05 ENCOUNTER — Telehealth: Payer: Self-pay | Admitting: *Deleted

## 2019-09-05 ENCOUNTER — Telehealth: Payer: Self-pay | Admitting: Hematology and Oncology

## 2019-09-05 ENCOUNTER — Encounter: Payer: Self-pay | Admitting: Hematology and Oncology

## 2019-09-05 DIAGNOSIS — Z299 Encounter for prophylactic measures, unspecified: Secondary | ICD-10-CM | POA: Insufficient documentation

## 2019-09-05 NOTE — Telephone Encounter (Signed)
[facility] faxed Prior authorization request for Ondansetron 4 mg ODT.  Request to [facility] [facility] letter tray receptacle of Prior Authorization requests and forms for review.

## 2019-09-05 NOTE — Assessment & Plan Note (Signed)
She is on chronic anticoagulation therapy for history of stroke She denies recent bleeding 

## 2019-09-05 NOTE — Telephone Encounter (Signed)
I talk with Brenda Cunningham regarding 10/6

## 2019-09-05 NOTE — Assessment & Plan Note (Signed)
She has chronic anemia, likely due to side effects of treatment She is not symptomatic Observe only. 

## 2019-09-05 NOTE — Assessment & Plan Note (Signed)
Due to her stroke and mild dysarthria, the patient has difficulties with communication Through her daughter, she indicated that the patient has been compliant taking her medications as directed

## 2019-09-05 NOTE — Assessment & Plan Note (Signed)
We discussed the importance of preventive care and reviewed the vaccination programs. She does not have any prior allergic reactions to influenza vaccination. She agrees to proceed with influenza vaccination today and we will administer it today at the clinic.  

## 2019-09-05 NOTE — Assessment & Plan Note (Signed)
Clinically, she is not symptomatic However, her last few tumor marker was slowly rising Due to her high risk situation, I plan to repeat imaging study next week for objective assessment of response of therapy I will see her again next week for further follow-up The plan of care is discussed with her daughter Overall, she tolerated Zejula well

## 2019-09-05 NOTE — Assessment & Plan Note (Signed)
She has been complaining of intermittent pain  She will continue on aggressive laxative therapy

## 2019-09-05 NOTE — Progress Notes (Signed)
[place] OFFICE PROGRESS NOTE  Patient Care Team: Helane Rima, MD as PCP - General (Family Medicine) Encarnacion Slates, MD as Referring Physician (Neurology) Cameron Sprang, MD as Consulting Physician (Neurology)  ASSESSMENT & PLAN:  Ovarian cancer [facility]) Clinically, she is not symptomatic However, her last few tumor marker was slowly rising Due to her high risk situation, I plan to repeat imaging study next week for objective assessment of response of therapy I will see her again next week for further follow-up The plan of care is discussed with her daughter Overall, she tolerated Zejula well  Anemia, chronic disease She has chronic anemia, likely due to side effects of treatment She is not symptomatic Observe only.  Right hemiparesis ([place]) Due to her stroke and mild dysarthria, the patient has difficulties with communication Through her daughter, she indicated that the patient has been compliant taking her medications as directed  Chronic atrial fibrillation She is on chronic anticoagulation therapy for history of stroke She denies recent bleeding  Other constipation She has been complaining of intermittent pain  She will continue on aggressive laxative therapy  Preventive measure We discussed the importance of preventive care and reviewed the vaccination programs. She does not have any prior allergic reactions to influenza vaccination. She agrees to proceed with influenza vaccination today and we will administer it today at the clinic.    Orders Placed This Encounter  Procedures  . CT ABDOMEN PELVIS W CONTRAST    Standing Status:   Future    Standing Expiration Date:   09/03/2020    Order Specific Question:   If indicated for the ordered procedure, I authorize the administration of contrast media per Radiology protocol    Answer:   Yes    Order Specific Question:   Preferred imaging location?    Answer:   [facility]    Order Specific  Question:   Radiology Contrast Protocol - do NOT remove file path    Answer:   [email or web address]  . CA 125    Standing Status:   Standing    Number of Occurrences:   9    Standing Expiration Date:   09/03/2020    INTERVAL HISTORY: Please see below for problem oriented charting. She returns for further follow-up The patient was able to understand English but has evidence of expressive dysphasia She has also word finding difficulties I collaborated the history with her daughter over the telephone She indicated that the patient is compliant taking Zejula as directed She continues to have mild occasional constipation She denies abdominal pain, bloating or vaginal bleeding No recent infection, fever or chills  SUMMARY OF ONCOLOGIC HISTORY: Oncology History Overview Note  Negative genetic testing   Ovarian cancer ([place])  11/28/2015 Imaging   Ct scan of abdomen: Peritoneal carcinomatosis and pelvic/inguinal adenopathy. Gynecologic primary is favored.   12/17/2015 Pathology Results   Lymph node, needle/core biopsy, L inguinal LAN - METASTATIC ADENOCARCINOMA. Microscopic Comment Immunohistochemistry will be performed and reported as an addendum. (JDP:ecj 12/18/2015) ADDENDUM: Immunohistochemistry shows strong positivity with cytokeratin 7, estrogen receptor (ER), progesterone receptor (PR), p53 and WT1. Negative markers are cytokeratin 20 , CDX- 2 and gross cystic disease fluid protein. 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 (JDP:kh 12-19-15   12/17/2015 Procedure   Technically successful ultrasound guided core left inguinal adenopathy biopsy   12/24/2015 Tumor Marker   Patient's tumor was tested for the following markers:  CA-125 Results of the tumor marker test revealed 608.2   12/27/2015 - 02/28/2016 Chemotherapy   She received neoadjuvant chemo x 3 cycles    01/01/2016 Tumor Marker   Patient's tumor was tested for the following markers: CA-125 Results of the tumor marker test revealed 741.6   01/29/2016 Procedure   Successful placement of a right internal jugular approach power injectable Port-A-Cath. The catheter is ready for immediate use   01/29/2016 Imaging   US abdomen 1. Hyperechoic 2.5 x 1.0 x 1.6 cm lesion in the region the caudate lobe of the liver. Similar finding noted on prior CT of 11/28/2015. This could represent hemangioma. This could represent a malignancy including metastatic disease. 2. Exam otherwise unremarkable. No gallstones. No biliary distention.   02/03/2016 Tumor Marker   Patient's tumor was tested for the following markers: CA-125 Results of the tumor marker test revealed 137.1   02/20/2016 Imaging   MRI brain No acute infarct.  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  No intracranial mass.  Moderate small vessel disease changes.  Global atrophy without hydrocephalus.  Expanded partially empty sella without secondary findings of pseudotumor cerebri.  Minimal mucosal thickening ethmoid sinus air cells. Small air-fluid levels maxillary sinuses bilaterally may indicate changes of acute sinusitis.    02/28/2016 Tumor Marker   Patient's tumor was tested for the following markers: CA-125 Results of the tumor marker test revealed 33.6   03/02/2016 Imaging   CT chest, abdomen and pelvis 1. Today's study demonstrates a positive response to therapy with resolution of the previously noted malignant ascites, significant regression of previously noted peritoneal implants, and regression of previously noted lymphadenopathy in the abdomen and pelvis. 2. No definite evidence to suggest metastatic disease to the thorax. 3. Stable 1.0 x 1.7 cm intermediate attenuation lesion associated with the posterior aspect of segment 1 of the liver, favored to represent a mildly proteinaceous hepatic cyst.  The possibility of a peritoneal implant in this region is not entirely excluded, but is not favored on today's examination. 4. Extensive post infectious scarring inguinal upper right lung, likely sequela of prior necrotizing pneumonia. 5. Multiple tiny pulmonary nodules scattered throughout the lungs bilaterally all measuring 4 mm or less. These are nonspecific, but favored to be benign. Given the patient's history of primary gynecologic malignancy, attention on followup studies is recommended to ensure the stability of these findings. 6. Additional incidental findings, as above   03/14/2016 Imaging   CT angiogram 1. No pulmonary emboli. 2. Chronic changes to the right upper lobe. 3. Stable lesion in the caudate lobe of the liver. 4. Stable soft tissue prominence to the right of the trachea as described above. 5. Stable small nodule in the right lung.    03/24/2016 Pathology Results   1. Omentum, resection for tumor - FOCAL ADENOCARCINOMA ASSOCIATED WITH EXTENSIVE FIBROSIS, INFLAMMATION AND HEMOSIDERIN DEPOSITION. 2. Uterus +/- tubes/ovaries, neoplastic, cervix - CERVIX: SLIGHT CERVICITIS AND SQUAMOUS METAPLASIA. - ENDOMETRIUM: ATROPHIC, NO HYPERPLASIA OR MALIGNANCY. - MYOMETRIUM: LEIOMYOMATA WITH DEGENERATIVE CHANGES. NO MALIGNANCY. - UTERINE SEROSA: FOCAL ADENOCARCINOMA ASSOCIATED WITH ADHESIONS. - RIGHT OVARY: BENIGN CALCIFIED NODULE AND BENIGN SEROUS CYST. NO MALIGNANCY IDENTIFIED. - RIGHT FALLOPIAN TUBE: UNREMARKABLE. NO MALIGNANCY IDENTIFIED. - LEFT OVARY: FOCAL ADENOCARCINOMA ASSOCIATED WITH EXTENSIVE FIBROSIS. - LEFT FALLOPIAN TUBE: HYDROSALPINX. NO MALIGNANCY IDENTIFIED. 3. Lymph node, biopsy, right external iliac - METASTATIC ADENOCARCINOMA, 4. Soft tissue, biopsy, left para colic gutter - FOCAL ADENOCARCINOMA ASSOCIATED WITH FIBROSIS. Microscopic Comment 2. OVARY Specimen(s): Uterus with bilateral ovaries, omentum, right  external iliac lymph node and left paracolic  gutter. Procedure: (including lymph node sampling) Hysterectomy with bilateral salpingo-oophorectomy, omentectomy, lymph node biopsy and paracolic gutter biopsy. Primary tumor site (including laterality): Left ovary. Ovarian surface involvement: Yes Ovarian capsule intact without fragmentation: N/A Maximum tumor size (cm): 2 cm, 2 cm Histologic type: Serous carcinoma. Grade: 3 Peritoneal implants: (specify invasive or non-invasive): Left paracolic gutter positive for carcinoma Pelvic extension (list additional structures on separate lines and if involved): Omentum, right paracolic gutter and uterine serosa. Lymph nodes: number examined 1 ; number positive 1 TNM code: ypT3a, ypN1b, ypMX FIGO Stage (based on pathologic findings, needs clinical correlation): IIIA2 Comments: The left ovary has a 2 cm nodule, which is composed primarily of fibrous tissue with small foci of residual adenocarcinoma. There is also microscopic involvement of the uterine serosa, the left paracolic gutter biopsy and the omentum. The left paracolic gutter and omental involvement is microscopic and both are associated with extensive fibrosis with focal hemosiderin deposition. The right external iliac node is completely replaced by metastatic adenocarcinoma with serous features. (JDP:ecj 03/30/2016)   03/24/2016 Surgery   Operation: Robotic-assisted laparoscopic total hysterectomy with bilateral salpingoophorectomy, omentectomy, radical tumor debulking  Surgeon: Donaciano Eva  Operative Findings:  : small nodules in omentum, no ascites. Omental nodule (2cm) adherent to lower anterior abdominal wall. 2cm left colonic gutter cystic nodule. 6cm right external iliac lymph node. Grossly normal ovaries and tubes. Fibroid uterus. No gross residual disease at completion of surgery representing R0 optimal/complete resection.    04/17/2016 Imaging   CT abdomen and pelvis 1. There is no evidence of acute inflammatory  process within abdomen or pelvis. 2. Stable low-density lesion within caudate lobe of the liver. No new hepatic lesions are noted. 3. Again noted lobulated renal contour and multifocal renal cortical scarring. No hydronephrosis or hydroureter. 4. No significant mesenteric adenopathy. No retroperitoneal adenopathy. Stable minimal residual peritoneal thickening within pelvis. No new pelvic implants or pelvic ascites. 5. Status post hysterectomy.  Unremarkable urinary bladder. 6. Moderate stool within colon as described above. No evidence of colitis.    04/24/2016 - 06/19/2016 Chemotherapy   She received 3 more cycles of chemo after surgery   05/14/2016 Tumor Marker   Patient's tumor was tested for the following markers: CA-125 Results of the tumor marker test revealed 22.5   06/04/2016 Tumor Marker   Patient's tumor was tested for the following markers: CA-125 Results of the tumor marker test revealed 13.7   07/07/2016 Genetic Testing   Patient has genetic testing done for breast/ovasrian cancer panel Results revealed patient has no actionable mutation   07/23/2016 Imaging   Ct abdomen and pelvis 1. Heterogeneously enhancing 4.9 x 3.2 x 4.4 cm mass along the right pelvic sidewall may represent locally recurrent disease or malignant lymphadenopathy. 2. No other signs of definite metastatic disease noted elsewhere in the abdomen or pelvis. 3. Stable low-attenuation hepatic lesion in the caudate lobe of the liver, which appears to demonstrates some progressive centripetal filling on delayed images. This lesion is presumably benign given its stability compared to prior studies, and is favored to represent a small cavernous hemangioma. 4. Cardiomegaly with biatrial dilatation, which is very severe on the right side. 5. Aortic atherosclerosis. 6. Additional incidental findings, as above.   07/23/2016 Tumor Marker   Patient's tumor was tested for the following markers: CA-125 Results of the tumor  marker test revealed 12.3   09/30/2016 Imaging   Ct abdomen and pelvis: 1. 4.9 cm heterogeneously  enhancing mass seen along the right pelvic sidewall previously has almost completely resolved. There is some ill-defined soft tissue attenuation/peritoneal thickening in this region today but no discrete measurable lesion is evident. 2. No new or progressive findings on today's exam. 3. Stable hypo attenuating lesion in the caudate lobe of the liver. 4. Subtle aortic atherosclerosis   09/30/2016 Tumor Marker   Patient's tumor was tested for the following markers: CA-125 Results of the tumor marker test revealed 9.6   10/05/2016 Pathology Results   Vagina, biopsy, left cuff - BENIGN FIBROEPITHELIAL (STROMAL) POLYP. - NO DYSPLASIA, ATYPIA OR MALIGNANCY IDENTIFIED.   01/14/2017 Tumor Marker   Patient's tumor was tested for the following markers: CA-125 Results of the tumor marker test revealed 11.9   05/05/2017 Tumor Marker   Patient's tumor was tested for the following markers: CA-125 Results of the tumor marker test revealed 28   06/14/2017 Tumor Marker   Patient's tumor was tested for the following markers: CA-125 Results of the tumor marker test revealed 45.7   06/24/2017 Imaging   Ct abdomen and pelvis: Increased peritoneal metastatic disease in abdomen and pelvis since prior exam. No evidence of ascites. Stable small benign hepatic hemangioma.   07/21/2017 Tumor Marker   Patient's tumor was tested for the following markers: CA-125 Results of the tumor marker test revealed 84.1   07/21/2017 - 11/03/2017 Chemotherapy   She received carboplatin and taxol   08/11/2017 Adverse Reaction   She had severe neuropathy due to treatment. Does of chemo is reduced starting cycle 2 onwards   09/02/2017 Tumor Marker   Patient's tumor was tested for the following markers: CA-125 Results of the tumor marker test revealed 27.7   09/16/2017 Imaging   CT CHEST IMPRESSION  1. No acute process or  evidence of metastatic disease in the chest. 2. Right upper lung bronchiectasis, consolidation, and architectural distortion are most likely post infectious or inflammatory and not significantly changed. 3. Aortic Atherosclerosis (ICD10-I70.0).  CT ABDOMEN AND PELVIS IMPRESSION  1. Response to therapy of nodal and peritoneal metastasis. 2. No new or progressive disease. 3. Caudate lobe hemangioma, as before. 4. Bilateral renal cortical thinning. Similar minimal right-sided caliectasis and hydroureter. Likely secondary to low-grade obstruction by the dominant right pelvic implant.   09/22/2017 Tumor Marker   Patient's tumor was tested for the following markers: CA-125 Results of the tumor marker test revealed 18   11/03/2017 Tumor Marker   Patient's tumor was tested for the following markers: CA-125 Results of the tumor marker test revealed 13.4   11/25/2017 Imaging   1. Stable to slight interval decrease in size of nodal and peritoneal metastatic disease within the abdomen. 2. No new or progressive disease.   12/03/2017 -  Chemotherapy   The patient is prescribed Zejula   01/17/2018 Tumor Marker   Patient's tumor was tested for the following markers: CA-125 Results of the tumor marker test revealed 11.9   02/22/2018 Tumor Marker   Patient's tumor was tested for the following markers: CA-125 Results of the tumor marker test revealed 11.7   02/22/2018 Imaging   1. Improved nodular thickening along the vaginal cuff on the left and medial to the right iliac vessels, likely treated implants. No evidence of progressive peritoneal disease. 2. Stable hepatic hemangioma. No evidence of solid visceral organ metastases. 3. Bilateral renal cortical scarring.   05/19/2018 Tumor Marker   Patient's tumor was tested for the following markers: CA-125 Results of the tumor marker test revealed 9.9  08/10/2018 Imaging   Stable mild nodular thickening of the left vaginal cuff. No new or  progressive disease within the abdomen or pelvis.  Stable small hepatic hemangioma.   09/21/2018 Tumor Marker   Patient's tumor was tested for the following markers: CA-125 Results of the tumor marker test revealed 10.3   11/11/2018 Tumor Marker   Patient's tumor was tested for the following markers: CA-125 Results of the tumor marker test revealed 10.5   12/15/2018 Tumor Marker   Patient's tumor was tested for the following markers: CA-125 Results of the tumor marker test revealed 12.6   01/06/2019 Tumor Marker   Patient's tumor was tested for the following markers: CA-125 Results of the tumor marker test revealed 14.6   01/12/2019 Imaging   1. Chronic extensive right upper lobe scarring changes and bronchiectasis. 2. No mediastinal or hilar mass or adenopathy and no findings for pulmonary metastatic disease. 3. Stable caudate lobe hemangioma. 4. No worrisome omental or peritoneal surface lesions. 5. Stable pericecal and small pelvic lymph nodes. No new or progressive findings.      REVIEW OF SYSTEMS:   Constitutional: Denies fevers, chills or abnormal weight loss Eyes: Denies blurriness of vision Ears, nose, mouth, throat, and face: Denies mucositis or sore throat Respiratory: Denies cough, dyspnea or wheezes Cardiovascular: Denies palpitation, chest discomfort or lower extremity swelling Gastrointestinal:  Denies nausea, heartburn or change in bowel habits Skin: Denies abnormal skin rashes Lymphatics: Denies new lymphadenopathy or easy bruising Neurological:Denies numbness, tingling or new weaknesses Behavioral/Psych: Mood is stable, no new changes  All other systems were reviewed with the patient and are negative.  I have reviewed the past medical history, past surgical history, social history and family history with the patient and they are unchanged from previous note.  ALLERGIES:  is allergic to codeine; lactulose; and latex.  MEDICATIONS:  Current Outpatient  Medications  Medication Sig Dispense Refill  . apixaban (ELIQUIS) 5 MG TABS tablet Take 1 tablet (5 mg total) by mouth 2 (two) times daily. 60 tablet 5  . atorvastatin (LIPITOR) 40 MG tablet Take 40 mg by mouth at bedtime.    . diphenhydrAMINE (BENADRYL) 25 MG tablet Take 25 mg by mouth every 6 (six) hours as needed for allergies.    . fluticasone (FLONASE) 50 MCG/ACT nasal spray Place 2 sprays into both nostrils 2 (two) times daily.    Marland Kitchen gabapentin (NEURONTIN) 600 MG tablet Take 1 tablet (600 mg total) by mouth 2 (two) times daily. 60 tablet 11  . guaifenesin (ROBITUSSIN) 100 MG/5ML syrup Take 200 mg by mouth 3 (three) times daily as needed for cough.    . lidocaine-prilocaine (EMLA) cream Apply to Porta-Cath 1-2 hours prior to access as directed. (Patient not taking: Reported on 03/28/2019) 30 g 1  . metoprolol succinate (TOPROL-XL) 25 MG 24 hr tablet Take 25 mg by mouth daily.   5  . Multiple Vitamin (MULTIVITAMIN WITH MINERALS) TABS tablet Take 1 tablet by mouth daily.    Marland Kitchen omega-3 acid ethyl esters (LOVAZA) 1 g capsule Take 1 g by mouth daily.    . ondansetron (ZOFRAN ODT) 4 MG disintegrating tablet Take 1 tablet (4 mg total) by mouth every 8 (eight) hours as needed for nausea or vomiting. 60 tablet 9  . oxyCODONE (OXY IR/ROXICODONE) 5 MG immediate release tablet Take 1 tablet (5 mg total) by mouth every 6 (six) hours as needed for severe pain. 90 tablet 0  . PARoxetine (PAXIL) 40 MG tablet Take 40 mg by  mouth daily.  0  . polyethylene glycol (MIRALAX / GLYCOLAX) packet Take 17 g by mouth daily.    . verapamil (CALAN-SR) 240 MG CR tablet Take 1 tablet (240 mg total) by mouth daily. 30 tablet 0  . ZEJULA 100 MG CAPS TAKE 3 CAPSULES ('300MG'$ ) BY MOUTH DAILY (Patient taking differently: Take 300 mg by mouth daily. ) 90 capsule 11   Current Facility-Administered Medications  Medication Dose Route Frequency Provider Last Rate Last Dose  . influenza vaccine adjuvanted (FLUAD) injection 0.5 mL  0.5  mL Intramuscular Once Alvy Bimler, Latasia Silberstein, MD        PHYSICAL EXAMINATION: ECOG PERFORMANCE STATUS: 2 - Symptomatic, <50% confined to bed  Vitals:   09/04/19 1216  BP: 124/81  Pulse: (!) 52  Resp: 18  Temp: 98.2 F (36.8 C)  SpO2: 100%   Filed Weights   09/04/19 1216  Weight: 199 lb 9.6 oz (90.5 kg)    GENERAL:alert, no distress and comfortable SKIN: skin color, texture, turgor are normal, no rashes or significant lesions EYES: normal, Conjunctiva are pink and non-injected, sclera clear OROPHARYNX:no exudate, no erythema and lips, buccal mucosa, and tongue normal  NECK: supple, thyroid normal size, non-tender, without nodularity LYMPH:  no palpable lymphadenopathy in the cervical, axillary or inguinal LUNGS: clear to auscultation and percussion with normal breathing effort HEART: regular rate & rhythm and no murmurs and no lower extremity edema ABDOMEN:abdomen soft, non-tender and normal bowel sounds Musculoskeletal:no cyanosis of digits and no clubbing  NEURO: alert & oriented x 3 with expressive dysphasia and persistent hemiparesis  LABORATORY DATA:  I have reviewed the data as listed    Component Value Date/Time   NA 138 09/04/2019 1210   NA 142 12/08/2017 1051   K 3.9 09/04/2019 1210   K 4.4 12/08/2017 1051   CL 104 09/04/2019 1210   CL 105 06/14/2017 1430   CO2 26 09/04/2019 1210   CO2 28 12/08/2017 1051   GLUCOSE 92 09/04/2019 1210   GLUCOSE 97 12/08/2017 1051   BUN 13 09/04/2019 1210   BUN 16.2 12/08/2017 1051   CREATININE 0.87 09/04/2019 1210   CREATININE 0.9 12/08/2017 1051   CALCIUM 9.2 09/04/2019 1210   CALCIUM 9.5 12/08/2017 1051   PROT 7.0 09/04/2019 1210   PROT 6.9 12/08/2017 1051   ALBUMIN 3.7 09/04/2019 1210   ALBUMIN 3.6 12/08/2017 1051   AST 19 09/04/2019 1210   AST 18 12/08/2017 1051   ALT 17 09/04/2019 1210   ALT 20 12/08/2017 1051   ALKPHOS 152 (H) 09/04/2019 1210   ALKPHOS 131 12/08/2017 1051   BILITOT 0.7 09/04/2019 1210   BILITOT 0.70  12/08/2017 1051   GFRNONAA >60 09/04/2019 1210   GFRAA >60 09/04/2019 1210    No results found for: SPEP, UPEP  Lab Results  Component Value Date   WBC 6.2 09/04/2019   NEUTROABS 3.9 09/04/2019   HGB 11.5 (L) 09/04/2019   HCT 33.1 (L) 09/04/2019   MCV 116.5 (H) 09/04/2019   PLT 196 09/04/2019      Chemistry      Component Value Date/Time   NA 138 09/04/2019 1210   NA 142 12/08/2017 1051   K 3.9 09/04/2019 1210   K 4.4 12/08/2017 1051   CL 104 09/04/2019 1210   CL 105 06/14/2017 1430   CO2 26 09/04/2019 1210   CO2 28 12/08/2017 1051   BUN 13 09/04/2019 1210   BUN 16.2 12/08/2017 1051   CREATININE 0.87 09/04/2019 1210   CREATININE  0.9 12/08/2017 1051      Component Value Date/Time   CALCIUM 9.2 09/04/2019 1210   CALCIUM 9.5 12/08/2017 1051   ALKPHOS 152 (H) 09/04/2019 1210   ALKPHOS 131 12/08/2017 1051   AST 19 09/04/2019 1210   AST 18 12/08/2017 1051   ALT 17 09/04/2019 1210   ALT 20 12/08/2017 1051   BILITOT 0.7 09/04/2019 1210   BILITOT 0.70 12/08/2017 1051       All questions were answered. The patient knows to call the clinic with any problems, questions or concerns. No barriers to learning was detected.  I spent 25 minutes counseling the patient face to face. The total time spent in the appointment was 30 minutes and more than 50% was on counseling and review of test results  Heath Lark, MD 09/05/2019 8:08 AM

## 2019-09-07 ENCOUNTER — Telehealth: Payer: Self-pay | Admitting: *Deleted

## 2019-09-07 NOTE — Telephone Encounter (Signed)
Received telephone call for Ondansetron prior auth. Patient is aproved through 09/2020

## 2019-09-11 ENCOUNTER — Ambulatory Visit (HOSPITAL_COMMUNITY)
Admission: RE | Admit: 2019-09-11 | Discharge: 2019-09-11 | Disposition: A | Discharge status: Home / Self Care | Payer: Medicare Other | Source: Ambulatory Visit | Attending: Hematology and Oncology | Admitting: Hematology and Oncology

## 2019-09-11 ENCOUNTER — Other Ambulatory Visit: Payer: Self-pay

## 2019-09-11 DIAGNOSIS — C562 Malignant neoplasm of left ovary: Secondary | ICD-10-CM | POA: Insufficient documentation

## 2019-09-11 MED ORDER — SODIUM CHLORIDE (PF) 0.9 % IJ SOLN
INTRAMUSCULAR | Status: AC
  Filled 2019-09-11: qty 50

## 2019-09-11 MED ORDER — IOHEXOL 300 MG/ML  SOLN
100.0000 mL | Freq: Once | INTRAMUSCULAR | Status: AC | PRN
  Administered 2019-09-11: 100 mL via INTRAVENOUS

## 2019-09-12 ENCOUNTER — Inpatient Hospital Stay: Payer: Medicare Other | Attending: Hematology and Oncology | Admitting: Hematology and Oncology

## 2019-09-12 ENCOUNTER — Encounter: Payer: Self-pay | Admitting: Hematology and Oncology

## 2019-09-12 ENCOUNTER — Other Ambulatory Visit: Payer: Self-pay

## 2019-09-12 VITALS — BP 123/70 | HR 65 | Temp 97.1°F | Resp 18 | Ht 70.0 in | Wt 198.6 lb

## 2019-09-12 DIAGNOSIS — Z5111 Encounter for antineoplastic chemotherapy: Secondary | ICD-10-CM | POA: Diagnosis not present

## 2019-09-12 DIAGNOSIS — R1310 Dysphagia, unspecified: Secondary | ICD-10-CM | POA: Insufficient documentation

## 2019-09-12 DIAGNOSIS — I4891 Unspecified atrial fibrillation: Secondary | ICD-10-CM | POA: Diagnosis not present

## 2019-09-12 DIAGNOSIS — C561 Malignant neoplasm of right ovary: Secondary | ICD-10-CM | POA: Insufficient documentation

## 2019-09-12 DIAGNOSIS — D638 Anemia in other chronic diseases classified elsewhere: Secondary | ICD-10-CM

## 2019-09-12 DIAGNOSIS — C563 Malignant neoplasm of bilateral ovaries: Secondary | ICD-10-CM

## 2019-09-12 DIAGNOSIS — K5909 Other constipation: Secondary | ICD-10-CM | POA: Diagnosis not present

## 2019-09-12 DIAGNOSIS — D61818 Other pancytopenia: Secondary | ICD-10-CM | POA: Insufficient documentation

## 2019-09-12 DIAGNOSIS — C800 Disseminated malignant neoplasm, unspecified: Secondary | ICD-10-CM

## 2019-09-12 DIAGNOSIS — F411 Generalized anxiety disorder: Secondary | ICD-10-CM | POA: Insufficient documentation

## 2019-09-12 DIAGNOSIS — Z7189 Other specified counseling: Secondary | ICD-10-CM

## 2019-09-12 DIAGNOSIS — Z79899 Other long term (current) drug therapy: Secondary | ICD-10-CM | POA: Diagnosis not present

## 2019-09-12 DIAGNOSIS — C562 Malignant neoplasm of left ovary: Secondary | ICD-10-CM | POA: Insufficient documentation

## 2019-09-12 DIAGNOSIS — C786 Secondary malignant neoplasm of retroperitoneum and peritoneum: Secondary | ICD-10-CM | POA: Diagnosis not present

## 2019-09-12 DIAGNOSIS — Z8673 Personal history of transient ischemic attack (TIA), and cerebral infarction without residual deficits: Secondary | ICD-10-CM | POA: Diagnosis not present

## 2019-09-12 DIAGNOSIS — Z7901 Long term (current) use of anticoagulants: Secondary | ICD-10-CM | POA: Diagnosis not present

## 2019-09-12 MED ORDER — OXYCODONE HCL 5 MG PO TABS: 5.0000 mg | ORAL_TABLET | Freq: Four times a day (QID) | ORAL | 0 refills | Status: DC | PRN

## 2019-09-12 NOTE — Assessment & Plan Note (Signed)
She has chronic anemia, likely due to side effects of treatment She is not symptomatic Observe only. 

## 2019-09-12 NOTE — Progress Notes (Signed)
[place] OFFICE PROGRESS NOTE  Patient Care Team: Helane Rima, MD as PCP - General (Family Medicine) Encarnacion Slates, MD as Referring Physician (Neurology) Cameron Sprang, MD as Consulting Physician (Neurology)  ASSESSMENT & PLAN:  Ovarian cancer [facility]) Unfortunately, her tumor marker is unreliable Despite normal values, CT imaging showed disease recurrence She is intermittently symptomatic I recommend discontinuation of niraparib We reviewed the current guidelines We discussed treatment options including carboplatinum alone, carboplatin in combination with gemcitabine or carboplatin in combination with liposomal doxorubicin Given her history of atrial fibrillation and stroke, I felt that liposomal doxorubicin would carry a higher risk of congestive heart failure in her situation  We discussed the role of chemotherapy is of palliative intent, in accordance with NCCN guidelines, based on publication below: Int J Gynecol Cancer. 2005 May-Jun;15 Suppl 1:36-41. Combination therapy with gemcitabine and carboplatin in recurrent ovarian cancer. Pfisterer J1, [place] I, [place] A, Eisenhauer E; AGO-OVAR,; NCIC CTG; EORTC GCG.  Participants with recurrent platinum-sensitive ovarian cancer were randomly assigned to receive either gemcitabine-carboplatin or carboplatin every 21 days. The primary objective was to compare progression-free survival (PFS) between the groups. From September 1999 to April 2002, 356 patients (178 participants received gemcitabine-carboplatin, 178 received carboplatin only) were randomized to treatment. Patients received six cycles of either gemcitabine-carboplatin or carboplatin. With a median follow-up of 17 months, median PFS was 8.6 months for gemcitabine-carboplatin (95% confidence interval [CI] 7.9-9.7 months) and 5.8 months for carboplatin (95% CI 5.2-7.1 months; hazard ratio [HR] 0.72 [95% CI 0.58-0.90; P = 0.0032]). The response rate for the  gemcitabine-carboplatin group was 47.2% (95% CI 39.9-54.5%) and 30.9% for carboplatin group (95% CI 24.1-37.7%; P = 0.0016). The HR for overall survival was 0.96 (95% CI 0.75-1.23; P = 0.7349). Patients treated with gemcitabine-carboplatin reported significantly faster palliation of abdominal symptoms and a significantly improved global quality of life. Gemcitabine-carboplatin treatment significantly improves the PFS of patients with platinum-sensitive recurrent ovarian cancer.  We discussed the role of chemotherapy. The intent is of palliative intent.  We discussed some of the risks, benefits, side-effects of carboplatin & gemcitabine  Some of the short term side-effects included, though not limited to, including weight loss, life threatening infections, risk of allergic reactions, need for transfusions of blood products, nausea, vomiting, change in bowel habits, loss of hair, admission to hospital for various reasons, and risks of death.   Long term side-effects are also discussed including risks of infertility, permanent damage to nerve function, hearing loss, chronic fatigue, kidney damage with possibility needing hemodialysis, and rare secondary malignancy including bone marrow disorders.  The patient is aware that the response rates discussed earlier is not guaranteed.  After a long discussion, patient made an informed decision to proceed with the prescribed plan of care.   Patient education material was dispensed  I plan carboplatin at AUC of 4 and gemcitabine at 800 mg per metered square I will see her prior to day 8 of treatment I do not plan prophylactic G-CSF support  Anemia, chronic disease She has chronic anemia, likely due to side effects of treatment She is not symptomatic Observe only.  Goals of care, counseling/discussion We have extensive goals of care discussions in the past Treatment goal is palliative in nature Her daughter is her dedicated healthcare power of  attorney The patient desires full code  Other constipation She has intermittent abdominal pain, could be due to peritoneal disease She also have altered bowel habits I prescribed narcotic prescription but also recommend aggressive  laxative therapy   Orders Placed This Encounter  Procedures  . CBC with Differential (Cancer Center Only)    Standing Status:   Standing    Number of Occurrences:   20    Standing Expiration Date:   09/11/2020  . CMP ([place] only)    Standing Status:   Standing    Number of Occurrences:   20    Standing Expiration Date:   09/11/2020    INTERVAL HISTORY: Please see below for problem oriented charting. She returns with her daughter, Kenney Houseman for further follow-up The patient has expressive dysphasia She understood the plan of care She continues to have intermittent abdominal discomfort that comes and goes She has recent changes in bowel habits The patient denies any recent signs or symptoms of bleeding such as spontaneous epistaxis, hematuria or hematochezia.   SUMMARY OF ONCOLOGIC HISTORY: Oncology History Overview Note  Negative genetic testing Progressed on Niraparib   Carcinomatosis ([place])  12/06/2015 Initial Diagnosis   Carcinomatosis ([place])   Ovarian cancer, bilateral ([place])  12/24/2015 Initial Diagnosis   Ovarian cancer, bilateral ([place])   Ovarian cancer ([place])  11/28/2015 Imaging   Ct scan of abdomen: Peritoneal carcinomatosis and pelvic/inguinal adenopathy. Gynecologic primary is favored.   12/17/2015 Pathology Results   Lymph node, needle/core biopsy, L inguinal LAN - METASTATIC ADENOCARCINOMA. Microscopic Comment Immunohistochemistry will be performed and reported as an addendum. (JDP:ecj 12/18/2015) ADDENDUM: Immunohistochemistry shows strong positivity with cytokeratin 7, estrogen receptor (ER), progesterone receptor (PR), p53 and WT1. Negative markers are cytokeratin 20 , CDX- 2 and gross cystic disease fluid protein. The  morphology and immunophenotype are most consistent with a high grade gynecologic carcinoma including high grade ovarian serous carcinoma. (JDP:kh 12-19-15   12/17/2015 Procedure   Technically successful ultrasound guided core left inguinal adenopathy biopsy   12/24/2015 Tumor Marker   Patient's tumor was tested for the following markers: CA-125 Results of the tumor marker test revealed 608.2   12/27/2015 - 02/28/2016 Chemotherapy   She received neoadjuvant chemo x 3 cycles   01/01/2016 Tumor Marker   Patient's tumor was tested for the following markers: CA-125 Results of the tumor marker test revealed 741.6   01/29/2016 Procedure   Successful placement of a right internal jugular approach power injectable Port-A-Cath. The catheter is ready for immediate use   01/29/2016 Imaging   US abdomen 1. Hyperechoic 2.5 x 1.0 x 1.6 cm lesion in the region the caudate lobe of the liver. Similar finding noted on prior CT of 11/28/2015. This could represent hemangioma. This could represent a malignancy including metastatic disease. 2. Exam otherwise unremarkable. No gallstones. No biliary distention.   02/03/2016 Tumor Marker   Patient's tumor was tested for the following markers: CA-125 Results of the tumor marker test revealed 137.1   02/20/2016 Imaging   MRI brain No acute infarct.  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  No intracranial mass.  Moderate small vessel disease changes.  Global atrophy without hydrocephalus.  Expanded partially empty sella without secondary findings of pseudotumor cerebri.  Minimal mucosal thickening ethmoid sinus air cells. Small air-fluid levels maxillary sinuses bilaterally may indicate changes of acute sinusitis.    02/28/2016 Tumor Marker   Patient's tumor was tested for the following markers: CA-125 Results of the tumor marker test revealed 33.6   03/02/2016 Imaging   CT chest, abdomen and pelvis 1. Today's study  demonstrates a positive response to therapy with resolution of the previously noted malignant ascites, significant regression of previously noted  peritoneal implants, and regression of previously noted lymphadenopathy in the abdomen and pelvis. 2. No definite evidence to suggest metastatic disease to the thorax. 3. Stable 1.0 x 1.7 cm intermediate attenuation lesion associated with the posterior aspect of segment 1 of the liver, favored to represent a mildly proteinaceous hepatic cyst. The possibility of a peritoneal implant in this region is not entirely excluded, but is not favored on today's examination. 4. Extensive post infectious scarring inguinal upper right lung, likely sequela of prior necrotizing pneumonia. 5. Multiple tiny pulmonary nodules scattered throughout the lungs bilaterally all measuring 4 mm or less. These are nonspecific, but favored to be benign. Given the patient's history of primary gynecologic malignancy, attention on followup studies is recommended to ensure the stability of these findings. 6. Additional incidental findings, as above   03/14/2016 Imaging   CT angiogram 1. No pulmonary emboli. 2. Chronic changes to the right upper lobe. 3. Stable lesion in the caudate lobe of the liver. 4. Stable soft tissue prominence to the right of the trachea as described above. 5. Stable small nodule in the right lung.    03/24/2016 Pathology Results   1. Omentum, resection for tumor - FOCAL ADENOCARCINOMA ASSOCIATED WITH EXTENSIVE FIBROSIS, INFLAMMATION AND HEMOSIDERIN DEPOSITION. 2. Uterus +/- tubes/ovaries, neoplastic, cervix - CERVIX: SLIGHT CERVICITIS AND SQUAMOUS METAPLASIA. - ENDOMETRIUM: ATROPHIC, NO HYPERPLASIA OR MALIGNANCY. - MYOMETRIUM: LEIOMYOMATA WITH DEGENERATIVE CHANGES. NO MALIGNANCY. - UTERINE SEROSA: FOCAL ADENOCARCINOMA ASSOCIATED WITH ADHESIONS. - RIGHT OVARY: BENIGN CALCIFIED NODULE AND BENIGN SEROUS CYST. NO MALIGNANCY IDENTIFIED. - RIGHT FALLOPIAN TUBE:  UNREMARKABLE. NO MALIGNANCY IDENTIFIED. - LEFT OVARY: FOCAL ADENOCARCINOMA ASSOCIATED WITH EXTENSIVE FIBROSIS. - LEFT FALLOPIAN TUBE: HYDROSALPINX. NO MALIGNANCY IDENTIFIED. 3. Lymph node, biopsy, right external iliac - METASTATIC ADENOCARCINOMA, 4. Soft tissue, biopsy, left para colic gutter - FOCAL ADENOCARCINOMA ASSOCIATED WITH FIBROSIS. Microscopic Comment 2. OVARY Specimen(s): Uterus with bilateral ovaries, omentum, right external iliac lymph node and left paracolic gutter. Procedure: (including lymph node sampling) Hysterectomy with bilateral salpingo-oophorectomy, omentectomy, lymph node biopsy and paracolic gutter biopsy. Primary tumor site (including laterality): Left ovary. Ovarian surface involvement: Yes Ovarian capsule intact without fragmentation: N/A Maximum tumor size (cm): 2 cm, 2 cm Histologic type: Serous carcinoma. Grade: 3 Peritoneal implants: (specify invasive or non-invasive): Left paracolic gutter positive for carcinoma Pelvic extension (list additional structures on separate lines and if involved): Omentum, right paracolic gutter and uterine serosa. Lymph nodes: number examined 1 ; number positive 1 TNM code: ypT3a, ypN1b, ypMX FIGO Stage (based on pathologic findings, needs clinical correlation): IIIA2 Comments: The left ovary has a 2 cm nodule, which is composed primarily of fibrous tissue with small foci of residual adenocarcinoma. There is also microscopic involvement of the uterine serosa, the left paracolic gutter biopsy and the omentum. The left paracolic gutter and omental involvement is microscopic and both are associated with extensive fibrosis with focal hemosiderin deposition. The right external iliac node is completely replaced by metastatic adenocarcinoma with serous features. (JDP:ecj 03/30/2016)   03/24/2016 Surgery   Operation: Robotic-assisted laparoscopic total hysterectomy with bilateral salpingoophorectomy, omentectomy, radical tumor  debulking  Surgeon: Donaciano Eva  Operative Findings:  : small nodules in omentum, no ascites. Omental nodule (2cm) adherent to lower anterior abdominal wall. 2cm left colonic gutter cystic nodule. 6cm right external iliac lymph node. Grossly normal ovaries and tubes. Fibroid uterus. No gross residual disease at completion of surgery representing R0 optimal/complete resection.    04/17/2016 Imaging   CT abdomen and pelvis 1. There is no evidence  of acute inflammatory process within abdomen or pelvis. 2. Stable low-density lesion within caudate lobe of the liver. No new hepatic lesions are noted. 3. Again noted lobulated renal contour and multifocal renal cortical scarring. No hydronephrosis or hydroureter. 4. No significant mesenteric adenopathy. No retroperitoneal adenopathy. Stable minimal residual peritoneal thickening within pelvis. No new pelvic implants or pelvic ascites. 5. Status post hysterectomy.  Unremarkable urinary bladder. 6. Moderate stool within colon as described above. No evidence of colitis.    04/24/2016 - 06/19/2016 Chemotherapy   She received 3 more cycles of chemo after surgery   05/14/2016 Tumor Marker   Patient's tumor was tested for the following markers: CA-125 Results of the tumor marker test revealed 22.5   06/04/2016 Tumor Marker   Patient's tumor was tested for the following markers: CA-125 Results of the tumor marker test revealed 13.7   07/07/2016 Genetic Testing   Patient has genetic testing done for breast/ovasrian cancer panel Results revealed patient has no actionable mutation   07/23/2016 Imaging   Ct abdomen and pelvis 1. Heterogeneously enhancing 4.9 x 3.2 x 4.4 cm mass along the right pelvic sidewall may represent locally recurrent disease or malignant lymphadenopathy. 2. No other signs of definite metastatic disease noted elsewhere in the abdomen or pelvis. 3. Stable low-attenuation hepatic lesion in the caudate lobe of the liver,  which appears to demonstrates some progressive centripetal filling on delayed images. This lesion is presumably benign given its stability compared to prior studies, and is favored to represent a small cavernous hemangioma. 4. Cardiomegaly with biatrial dilatation, which is very severe on the right side. 5. Aortic atherosclerosis. 6. Additional incidental findings, as above.   07/23/2016 Tumor Marker   Patient's tumor was tested for the following markers: CA-125 Results of the tumor marker test revealed 12.3   09/30/2016 Imaging   Ct abdomen and pelvis: 1. 4.9 cm heterogeneously enhancing mass seen along the right pelvic sidewall previously has almost completely resolved. There is some ill-defined soft tissue attenuation/peritoneal thickening in this region today but no discrete measurable lesion is evident. 2. No new or progressive findings on today's exam. 3. Stable hypo attenuating lesion in the caudate lobe of the liver. 4. Subtle aortic atherosclerosis   09/30/2016 Tumor Marker   Patient's tumor was tested for the following markers: CA-125 Results of the tumor marker test revealed 9.6   10/05/2016 Pathology Results   Vagina, biopsy, left cuff - BENIGN FIBROEPITHELIAL (STROMAL) POLYP. - NO DYSPLASIA, ATYPIA OR MALIGNANCY IDENTIFIED.   01/14/2017 Tumor Marker   Patient's tumor was tested for the following markers: CA-125 Results of the tumor marker test revealed 11.9   05/05/2017 Tumor Marker   Patient's tumor was tested for the following markers: CA-125 Results of the tumor marker test revealed 28   06/14/2017 Tumor Marker   Patient's tumor was tested for the following markers: CA-125 Results of the tumor marker test revealed 45.7   06/24/2017 Imaging   Ct abdomen and pelvis: Increased peritoneal metastatic disease in abdomen and pelvis since prior exam. No evidence of ascites. Stable small benign hepatic hemangioma.   07/21/2017 Tumor Marker   Patient's tumor was tested for the  following markers: CA-125 Results of the tumor marker test revealed 84.1   07/21/2017 - 11/03/2017 Chemotherapy   She received carboplatin and taxol   08/11/2017 Adverse Reaction   She had severe neuropathy due to treatment. Does of chemo is reduced starting cycle 2 onwards   09/02/2017 Tumor Marker   Patient's tumor was  tested for the following markers: CA-125 Results of the tumor marker test revealed 27.7   09/16/2017 Imaging   CT CHEST IMPRESSION  1. No acute process or evidence of metastatic disease in the chest. 2. Right upper lung bronchiectasis, consolidation, and architectural distortion are most likely post infectious or inflammatory and not significantly changed. 3. Aortic Atherosclerosis (ICD10-I70.0).  CT ABDOMEN AND PELVIS IMPRESSION  1. Response to therapy of nodal and peritoneal metastasis. 2. No new or progressive disease. 3. Caudate lobe hemangioma, as before. 4. Bilateral renal cortical thinning. Similar minimal right-sided caliectasis and hydroureter. Likely secondary to low-grade obstruction by the dominant right pelvic implant.   09/22/2017 Tumor Marker   Patient's tumor was tested for the following markers: CA-125 Results of the tumor marker test revealed 18   11/03/2017 Tumor Marker   Patient's tumor was tested for the following markers: CA-125 Results of the tumor marker test revealed 13.4   11/25/2017 Imaging   1. Stable to slight interval decrease in size of nodal and peritoneal metastatic disease within the abdomen. 2. No new or progressive disease.   12/03/2017 -  Chemotherapy   The patient is prescribed [place]   01/17/2018 Tumor Marker   Patient's tumor was tested for the following markers: CA-125 Results of the tumor marker test revealed 11.9   02/22/2018 Tumor Marker   Patient's tumor was tested for the following markers: CA-125 Results of the tumor marker test revealed 11.7   02/22/2018 Imaging   1. Improved nodular thickening along the  vaginal cuff on the left and medial to the right iliac vessels, likely treated implants. No evidence of progressive peritoneal disease. 2. Stable hepatic hemangioma. No evidence of solid visceral organ metastases. 3. Bilateral renal cortical scarring.   05/19/2018 Tumor Marker   Patient's tumor was tested for the following markers: CA-125 Results of the tumor marker test revealed 9.9   08/10/2018 Imaging   Stable mild nodular thickening of the left vaginal cuff. No new or progressive disease within the abdomen or pelvis.  Stable small hepatic hemangioma.   09/21/2018 Tumor Marker   Patient's tumor was tested for the following markers: CA-125 Results of the tumor marker test revealed 10.3   11/11/2018 Tumor Marker   Patient's tumor was tested for the following markers: CA-125 Results of the tumor marker test revealed 10.5   12/15/2018 Tumor Marker   Patient's tumor was tested for the following markers: CA-125 Results of the tumor marker test revealed 12.6   01/06/2019 Tumor Marker   Patient's tumor was tested for the following markers: CA-125 Results of the tumor marker test revealed 14.6   01/12/2019 Imaging   1. Chronic extensive right upper lobe scarring changes and bronchiectasis. 2. No mediastinal or hilar mass or adenopathy and no findings for pulmonary metastatic disease. 3. Stable caudate lobe hemangioma. 4. No worrisome omental or peritoneal surface lesions. 5. Stable pericecal and small pelvic lymph nodes. No new or progressive findings.    09/11/2019 Imaging   Ct abdomen and pelvis Pericecal peritoneal metastatic implants     REVIEW OF SYSTEMS:   Constitutional: Denies fevers, chills or abnormal weight loss Eyes: Denies blurriness of vision Ears, nose, mouth, throat, and face: Denies mucositis or sore throat Respiratory: Denies cough, dyspnea or wheezes Cardiovascular: Denies palpitation, chest discomfort or lower extremity swelling Skin: Denies abnormal skin  rashes Lymphatics: Denies new lymphadenopathy or easy bruising Neurological:Denies numbness, tingling or new weaknesses Behavioral/Psych: Mood is stable, no new changes  All other systems were reviewed  with the patient and are negative.  I have reviewed the past medical history, past surgical history, social history and family history with the patient and they are unchanged from previous note.  ALLERGIES:  is allergic to codeine; lactulose; and latex.  MEDICATIONS:  Current Outpatient Medications  Medication Sig Dispense Refill  . apixaban (ELIQUIS) 5 MG TABS tablet Take 1 tablet (5 mg total) by mouth 2 (two) times daily. 60 tablet 5  . atorvastatin (LIPITOR) 40 MG tablet Take 40 mg by mouth at bedtime.    . diphenhydrAMINE (BENADRYL) 25 MG tablet Take 25 mg by mouth every 6 (six) hours as needed for allergies.    . fluticasone (FLONASE) 50 MCG/ACT nasal spray Place 2 sprays into both nostrils 2 (two) times daily.    Marland Kitchen gabapentin (NEURONTIN) 600 MG tablet Take 1 tablet (600 mg total) by mouth 2 (two) times daily. 60 tablet 11  . guaifenesin (ROBITUSSIN) 100 MG/5ML syrup Take 200 mg by mouth 3 (three) times daily as needed for cough.    . lidocaine-prilocaine (EMLA) cream Apply to Porta-Cath 1-2 hours prior to access as directed. (Patient not taking: Reported on 03/28/2019) 30 g 1  . metoprolol succinate (TOPROL-XL) 25 MG 24 hr tablet Take 25 mg by mouth daily.   5  . Multiple Vitamin (MULTIVITAMIN WITH MINERALS) TABS tablet Take 1 tablet by mouth daily.    Marland Kitchen omega-3 acid ethyl esters (LOVAZA) 1 g capsule Take 1 g by mouth daily.    . ondansetron (ZOFRAN ODT) 4 MG disintegrating tablet Take 1 tablet (4 mg total) by mouth every 8 (eight) hours as needed for nausea or vomiting. 60 tablet 9  . oxyCODONE (OXY IR/ROXICODONE) 5 MG immediate release tablet Take 1 tablet (5 mg total) by mouth every 6 (six) hours as needed for severe pain. 30 tablet 0  . PARoxetine (PAXIL) 40 MG tablet Take 40 mg by  mouth daily.  0  . polyethylene glycol (MIRALAX / GLYCOLAX) packet Take 17 g by mouth daily.    . verapamil (CALAN-SR) 240 MG CR tablet Take 1 tablet (240 mg total) by mouth daily. 30 tablet 0   No current facility-administered medications for this visit.     PHYSICAL EXAMINATION: ECOG PERFORMANCE STATUS: 2 - Symptomatic, <50% confined to bed  Vitals:   09/12/19 1137  BP: 123/70  Pulse: 65  Resp: 18  Temp: (!) 97.1 F (36.2 C)  SpO2: 100%   Filed Weights   09/12/19 1137  Weight: 198 lb 9.6 oz (90.1 kg)    GENERAL:alert, no distress and comfortable Musculoskeletal:no cyanosis of digits and no clubbing  NEURO: alert & oriented x 3 with expressive dysphasia,  LABORATORY DATA:  I have reviewed the data as listed    Component Value Date/Time   NA 138 09/04/2019 1210   NA 142 12/08/2017 1051   K 3.9 09/04/2019 1210   K 4.4 12/08/2017 1051   CL 104 09/04/2019 1210   CL 105 06/14/2017 1430   CO2 26 09/04/2019 1210   CO2 28 12/08/2017 1051   GLUCOSE 92 09/04/2019 1210   GLUCOSE 97 12/08/2017 1051   BUN 13 09/04/2019 1210   BUN 16.2 12/08/2017 1051   CREATININE 0.87 09/04/2019 1210   CREATININE 0.9 12/08/2017 1051   CALCIUM 9.2 09/04/2019 1210   CALCIUM 9.5 12/08/2017 1051   PROT 7.0 09/04/2019 1210   PROT 6.9 12/08/2017 1051   ALBUMIN 3.7 09/04/2019 1210   ALBUMIN 3.6 12/08/2017 1051   AST 19  09/04/2019 1210   AST 18 12/08/2017 1051   ALT 17 09/04/2019 1210   ALT 20 12/08/2017 1051   ALKPHOS 152 (H) 09/04/2019 1210   ALKPHOS 131 12/08/2017 1051   BILITOT 0.7 09/04/2019 1210   BILITOT 0.70 12/08/2017 1051   GFRNONAA >60 09/04/2019 1210   GFRAA >60 09/04/2019 1210    No results found for: SPEP, UPEP  Lab Results  Component Value Date   WBC 6.2 09/04/2019   NEUTROABS 3.9 09/04/2019   HGB 11.5 (L) 09/04/2019   HCT 33.1 (L) 09/04/2019   MCV 116.5 (H) 09/04/2019   PLT 196 09/04/2019      Chemistry      Component Value Date/Time   NA 138 09/04/2019 1210    NA 142 12/08/2017 1051   K 3.9 09/04/2019 1210   K 4.4 12/08/2017 1051   CL 104 09/04/2019 1210   CL 105 06/14/2017 1430   CO2 26 09/04/2019 1210   CO2 28 12/08/2017 1051   BUN 13 09/04/2019 1210   BUN 16.2 12/08/2017 1051   CREATININE 0.87 09/04/2019 1210   CREATININE 0.9 12/08/2017 1051      Component Value Date/Time   CALCIUM 9.2 09/04/2019 1210   CALCIUM 9.5 12/08/2017 1051   ALKPHOS 152 (H) 09/04/2019 1210   ALKPHOS 131 12/08/2017 1051   AST 19 09/04/2019 1210   AST 18 12/08/2017 1051   ALT 17 09/04/2019 1210   ALT 20 12/08/2017 1051   BILITOT 0.7 09/04/2019 1210   BILITOT 0.70 12/08/2017 1051       RADIOGRAPHIC STUDIES: I have reviewed multiple imaging studies with the patient and her daughter I have personally reviewed the radiological images as listed and agreed with the findings in the report. Ct Abdomen Pelvis W Contrast  Result Date: 09/11/2019 CLINICAL DATA:  Epithelial ovarian/fallopian tube/primary peritoneal cancer. Assess treatment response. EXAM: CT ABDOMEN AND PELVIS WITH CONTRAST TECHNIQUE: Multidetector CT imaging of the abdomen and pelvis was performed using the standard protocol following bolus administration of intravenous contrast. CONTRAST:  17m OMNIPAQUE IOHEXOL 300 MG/ML  SOLN COMPARISON:  01/12/2019. FINDINGS: Lower chest: Lung bases are clear. Heart is mildly enlarged. No pericardial pleural effusion. Distal esophagus is unremarkable. Hepatobiliary: 1.7 cm low-attenuation lesion in the caudate is unchanged and previously characterized as a hemangioma. Liver and gallbladder are otherwise unremarkable. Extrahepatic bile duct may be minimally prominent for age, measuring up to 8 mm. Pancreas: Negative. Spleen: Negative. Adrenals/Urinary Tract: Adrenal glands are unremarkable. Scarring in the kidneys bilaterally. Subcentimeter low-attenuation lesion in the right kidney is too small to characterize but statistically, a cyst is likely. Ureters are  decompressed. Bladder is grossly unremarkable. Stomach/Bowel: Stomach, small bowel and colon are unremarkable. Appendix is not readily visualized. Stool is seen throughout the colon, indicative of constipation. Vascular/Lymphatic: Vascular structures are unremarkable. No pathologically enlarged lymph nodes. Reproductive: Hysterectomy.  No adnexal mass. Other: Peritoneal nodule in the anterior right pelvis measures 2.1 x 2.6 cm (2/67). There may be soft tissue nodularity along the cecum (2/64 and 67). Ileocolic mesenteric lymph nodes measure up to 6 mm (2/55), similar. Additional nodules are seen in the inferior small bowel mesentery, measuring up to 7 mm (2/69), possibly new. No ascites. Musculoskeletal: Degenerative changes in the spine. No worrisome lytic or sclerotic lesions. IMPRESSION: Pericecal peritoneal metastatic implants, as discussed above. Electronically Signed   By: MLorin PicketM.D.   On: 09/11/2019 15:54    All questions were answered. The patient knows to call the clinic with any  problems, questions or concerns. No barriers to learning was detected.  I spent 30 minutes counseling the patient face to face. The total time spent in the appointment was 40 minutes and more than 50% was on counseling and review of test results  Heath Lark, MD 09/12/2019 12:03 PM

## 2019-09-12 NOTE — Assessment & Plan Note (Signed)
Unfortunately, her tumor marker is unreliable Despite normal values, CT imaging showed disease recurrence She is intermittently symptomatic I recommend discontinuation of niraparib We reviewed the current guidelines We discussed treatment options including carboplatinum alone, carboplatin in combination with gemcitabine or carboplatin in combination with liposomal doxorubicin Given her history of atrial fibrillation and stroke, I felt that liposomal doxorubicin would carry a higher risk of congestive heart failure in her situation  We discussed the role of chemotherapy is of palliative intent, in accordance with NCCN guidelines, based on publication below: Int J Gynecol Cancer. 2005 May-Jun;15 Suppl 1:36-41. Combination therapy with gemcitabine and carboplatin in recurrent ovarian cancer. Pfisterer J1, [place] I, [place] A, Eisenhauer E; AGO-OVAR,; NCIC CTG; EORTC GCG.  Participants with recurrent platinum-sensitive ovarian cancer were randomly assigned to receive either gemcitabine-carboplatin or carboplatin every 21 days. The primary objective was to compare progression-free survival (PFS) between the groups. From September 1999 to April 2002, 356 patients (178 participants received gemcitabine-carboplatin, 178 received carboplatin only) were randomized to treatment. Patients received six cycles of either gemcitabine-carboplatin or carboplatin. With a median follow-up of 17 months, median PFS was 8.6 months for gemcitabine-carboplatin (95% confidence interval [CI] 7.9-9.7 months) and 5.8 months for carboplatin (95% CI 5.2-7.1 months; hazard ratio [HR] 0.72 [95% CI 0.58-0.90; P = 0.0032]). The response rate for the gemcitabine-carboplatin group was 47.2% (95% CI 39.9-54.5%) and 30.9% for carboplatin group (95% CI 24.1-37.7%; P = 0.0016). The HR for overall survival was 0.96 (95% CI 0.75-1.23; P = 0.7349). Patients treated with gemcitabine-carboplatin reported significantly faster palliation of  abdominal symptoms and a significantly improved global quality of life. Gemcitabine-carboplatin treatment significantly improves the PFS of patients with platinum-sensitive recurrent ovarian cancer.  We discussed the role of chemotherapy. The intent is of palliative intent.  We discussed some of the risks, benefits, side-effects of carboplatin & gemcitabine  Some of the short term side-effects included, though not limited to, including weight loss, life threatening infections, risk of allergic reactions, need for transfusions of blood products, nausea, vomiting, change in bowel habits, loss of hair, admission to hospital for various reasons, and risks of death.   Long term side-effects are also discussed including risks of infertility, permanent damage to nerve function, hearing loss, chronic fatigue, kidney damage with possibility needing hemodialysis, and rare secondary malignancy including bone marrow disorders.  The patient is aware that the response rates discussed earlier is not guaranteed.  After a long discussion, patient made an informed decision to proceed with the prescribed plan of care.   Patient education material was dispensed  I plan carboplatin at AUC of 4 and gemcitabine at 800 mg per metered square I will see her prior to day 8 of treatment I do not plan prophylactic G-CSF support

## 2019-09-12 NOTE — Progress Notes (Signed)
DISCONTINUE ON PATHWAY REGIMEN - Ovarian     A cycle is every 21 days:     Paclitaxel      Carboplatin   **Always confirm dose/schedule in your pharmacy ordering system**  REASON: Other Reason PRIOR TREATMENT: [id number]: Carboplatin AUC=6 + Paclitaxel 175 mg/m2 q21 Days; Stop Treatment After 6 Cycles If Complete Response, Otherwise Continue Treatment Until Progression or Unacceptable Toxicity TREATMENT RESPONSE: Stable Disease (SD)  START ON PATHWAY REGIMEN - Ovarian     A cycle is every 21 days:     Gemcitabine      Carboplatin   **Always confirm dose/schedule in your pharmacy ordering system**  Patient Characteristics: Recurrent or Progressive Disease, Third Line Therapy, Platinum Sensitive and >  6 Months Since Last Platinum Therapy, BRCA Mutation Absent/Unknown Therapeutic Status: Recurrent or Progressive Disease BRCA Mutation Status: Absent Line of Therapy: Third Line  Intent of Therapy: Non-Curative / Palliative Intent, Discussed with Patient

## 2019-09-12 NOTE — Assessment & Plan Note (Signed)
She has intermittent abdominal pain, could be due to peritoneal disease She also have altered bowel habits I prescribed narcotic prescription but also recommend aggressive laxative therapy

## 2019-09-12 NOTE — Assessment & Plan Note (Signed)
We have extensive goals of care discussions in the past Treatment goal is palliative in nature Her daughter is her dedicated healthcare power of attorney The patient desires full code

## 2019-09-14 ENCOUNTER — Telehealth: Payer: Self-pay | Admitting: Hematology and Oncology

## 2019-09-14 NOTE — Telephone Encounter (Signed)
I could not reach patient no voicemail set up I will mail

## 2019-09-19 ENCOUNTER — Inpatient Hospital Stay: Payer: Medicare Other

## 2019-09-19 ENCOUNTER — Other Ambulatory Visit: Payer: Self-pay

## 2019-09-19 VITALS — BP 115/72 | HR 65 | Temp 98.5°F | Resp 18

## 2019-09-19 DIAGNOSIS — C561 Malignant neoplasm of right ovary: Secondary | ICD-10-CM

## 2019-09-19 DIAGNOSIS — Z5111 Encounter for antineoplastic chemotherapy: Secondary | ICD-10-CM | POA: Diagnosis not present

## 2019-09-19 DIAGNOSIS — C800 Disseminated malignant neoplasm, unspecified: Secondary | ICD-10-CM

## 2019-09-19 DIAGNOSIS — C563 Malignant neoplasm of bilateral ovaries: Secondary | ICD-10-CM

## 2019-09-19 DIAGNOSIS — Z95828 Presence of other vascular implants and grafts: Secondary | ICD-10-CM

## 2019-09-19 DIAGNOSIS — Z7189 Other specified counseling: Secondary | ICD-10-CM

## 2019-09-19 DIAGNOSIS — C562 Malignant neoplasm of left ovary: Secondary | ICD-10-CM

## 2019-09-19 LAB — CMP (CANCER CENTER ONLY)
ALT: 23 U/L (ref 0–44)
AST: 23 U/L (ref 15–41)
Albumin: 3.5 g/dL (ref 3.5–5.0)
Alkaline Phosphatase: 144 U/L — ABNORMAL HIGH (ref 38–126)
Anion gap: 10 (ref 5–15)
BUN: 14 mg/dL (ref 8–23)
CO2: 27 mmol/L (ref 22–32)
Calcium: 9.3 mg/dL (ref 8.9–10.3)
Chloride: 105 mmol/L (ref 98–111)
Creatinine: 0.84 mg/dL (ref 0.44–1.00)
GFR, Est AFR Am: >60 mL/min (ref >60)
GFR, Estimated: >60 mL/min (ref >60)
Glucose, Bld: 100 mg/dL — ABNORMAL HIGH (ref 70–99)
Potassium: 3.9 mmol/L (ref 3.5–5.1)
Sodium: 142 mmol/L (ref 135–145)
Total Bilirubin: 0.6 mg/dL (ref 0.3–1.2)
Total Protein: 6.9 g/dL (ref 6.5–8.1)

## 2019-09-19 LAB — CBC WITH DIFFERENTIAL (CANCER CENTER ONLY)
Abs Immature Granulocytes: 0.01 10*3/uL (ref 0.00–0.07)
Basophils Absolute: 0 10*3/uL (ref 0.0–0.1)
Basophils Relative: 0 %
Eosinophils Absolute: 0.1 10*3/uL (ref 0.0–0.5)
Eosinophils Relative: 1 %
HCT: 33.2 % — ABNORMAL LOW (ref 36.0–46.0)
Hemoglobin: 11.6 g/dL — ABNORMAL LOW (ref 12.0–15.0)
Immature Granulocytes: 0 %
Lymphocytes Relative: 31 %
Lymphs Abs: 1.8 10*3/uL (ref 0.7–4.0)
MCH: 40.6 pg — ABNORMAL HIGH (ref 26.0–34.0)
MCHC: 34.9 g/dL (ref 30.0–36.0)
MCV: 116.1 fL — ABNORMAL HIGH (ref 80.0–100.0)
Monocytes Absolute: 0.7 10*3/uL (ref 0.1–1.0)
Monocytes Relative: 13 %
Neutro Abs: 3.2 10*3/uL (ref 1.7–7.7)
Neutrophils Relative %: 55 %
Platelet Count: 237 10*3/uL (ref 150–400)
RBC: 2.86 MIL/uL — ABNORMAL LOW (ref 3.87–5.11)
RDW: 15.9 % — ABNORMAL HIGH (ref 11.5–15.5)
WBC Count: 5.7 10*3/uL (ref 4.0–10.5)
nRBC: 0.9 % — ABNORMAL HIGH (ref 0.0–0.2)

## 2019-09-19 MED ORDER — PALONOSETRON HCL INJECTION 0.25 MG/5ML
0.2500 mg | Freq: Once | INTRAVENOUS | Status: AC
  Administered 2019-09-19: 0.25 mg via INTRAVENOUS

## 2019-09-19 MED ORDER — SODIUM CHLORIDE 0.9% FLUSH
10.0000 mL | INTRAVENOUS | Status: DC | PRN
  Administered 2019-09-19: 10 mL
  Filled 2019-09-19: qty 10

## 2019-09-19 MED ORDER — SODIUM CHLORIDE 0.9% FLUSH
10.0000 mL | Freq: Once | INTRAVENOUS | Status: AC
  Administered 2019-09-19: 13:00:00 10 mL
  Filled 2019-09-19: qty 10

## 2019-09-19 MED ORDER — SODIUM CHLORIDE 0.9 % IV SOLN
Freq: Once | INTRAVENOUS | Status: AC
  Administered 2019-09-19: 14:00:00 via INTRAVENOUS
  Filled 2019-09-19: qty 5

## 2019-09-19 MED ORDER — SODIUM CHLORIDE 0.9 % IV SOLN
800.0000 mg/m2 | Freq: Once | INTRAVENOUS | Status: AC
  Administered 2019-09-19: 1672 mg via INTRAVENOUS
  Filled 2019-09-19: qty 43.97

## 2019-09-19 MED ORDER — SODIUM CHLORIDE 0.9 % IV SOLN
398.0000 mg | Freq: Once | INTRAVENOUS | Status: AC
  Administered 2019-09-19: 400 mg via INTRAVENOUS
  Filled 2019-09-19: qty 40

## 2019-09-19 MED ORDER — PALONOSETRON HCL INJECTION 0.25 MG/5ML
INTRAVENOUS | Status: AC
  Filled 2019-09-19: qty 5

## 2019-09-19 MED ORDER — SODIUM CHLORIDE 0.9 % IV SOLN
Freq: Once | INTRAVENOUS | Status: AC
  Administered 2019-09-19: 14:00:00 via INTRAVENOUS
  Filled 2019-09-19: qty 250

## 2019-09-19 MED ORDER — HEPARIN SOD (PORK) LOCK FLUSH 100 UNIT/ML IV SOLN
500.0000 [IU] | Freq: Once | INTRAVENOUS | Status: AC | PRN
  Administered 2019-09-19: 500 [IU]
  Filled 2019-09-19: qty 5

## 2019-09-19 NOTE — Patient Instructions (Signed)
[facility] Discharge Instructions for Patients Receiving Chemotherapy  Today you received the following chemotherapy agents Gemcitabine (Gemzar) and Carboplatin  To help prevent nausea and vomiting after your treatment, we encourage you to take your nausea medication as directed.   If you develop nausea and vomiting that is not controlled by your nausea medication, call the clinic.   BELOW ARE SYMPTOMS THAT SHOULD BE REPORTED IMMEDIATELY:  *FEVER GREATER THAN 100.5 F  *CHILLS WITH OR WITHOUT FEVER  NAUSEA AND VOMITING THAT IS NOT CONTROLLED WITH YOUR NAUSEA MEDICATION  *UNUSUAL SHORTNESS OF BREATH  *UNUSUAL BRUISING OR BLEEDING  TENDERNESS IN MOUTH AND THROAT WITH OR WITHOUT PRESENCE OF ULCERS  *URINARY PROBLEMS  *BOWEL PROBLEMS  UNUSUAL RASH Items with * indicate a potential emergency and should be followed up as soon as possible.  Feel free to call the clinic should you have any questions or concerns. The clinic phone number is [phone].  Please show the [facility] at check-in to the Emergency Department and triage nurse.  Gemcitabine injection What is this medicine? GEMCITABINE (jem SYE ta been) is a chemotherapy drug. This medicine is used to treat many types of cancer like breast cancer, lung cancer, pancreatic cancer, and ovarian cancer. This medicine may be used for other purposes; ask your health care provider or pharmacist if you have questions. COMMON BRAND NAME(S): Gemzar, Infugem What should I tell my health care provider before I take this medicine? They need to know if you have any of these conditions:  blood disorders  infection  kidney disease  liver disease  lung or breathing disease, like asthma  recent or ongoing radiation therapy  an unusual or allergic reaction to gemcitabine, other chemotherapy, other medicines, foods, dyes, or preservatives  pregnant or trying to get pregnant  breast-feeding How should I use  this medicine? This drug is given as an infusion into a vein. It is administered in a hospital or clinic by a specially trained health care professional. Talk to your pediatrician regarding the use of this medicine in children. Special care may be needed. Overdosage: If you think you have taken too much of this medicine contact a poison control center or emergency room at once. NOTE: This medicine is only for you. Do not share this medicine with others. What if I miss a dose? It is important not to miss your dose. Call your doctor or health care professional if you are unable to keep an appointment. What may interact with this medicine?  medicines to increase blood counts like filgrastim, pegfilgrastim, sargramostim  some other chemotherapy drugs like cisplatin  vaccines Talk to your doctor or health care professional before taking any of these medicines:  acetaminophen  aspirin  ibuprofen  ketoprofen  naproxen This list may not describe all possible interactions. Give your health care provider a list of all the medicines, herbs, non-prescription drugs, or dietary supplements you use. Also tell them if you smoke, drink alcohol, or use illegal drugs. Some items may interact with your medicine. What should I watch for while using this medicine? Visit your doctor for checks on your progress. This drug may make you feel generally unwell. This is not uncommon, as chemotherapy can affect healthy cells as well as cancer cells. Report any side effects. Continue your course of treatment even though you feel ill unless your doctor tells you to stop. In some cases, you may be given additional medicines to help with side effects. Follow all directions for  their use. Call your doctor or health care professional for advice if you get a fever, chills or sore throat, or other symptoms of a cold or flu. Do not treat yourself. This drug decreases your body's ability to fight infections. Try to avoid being  around people who are sick. This medicine may increase your risk to bruise or bleed. Call your doctor or health care professional if you notice any unusual bleeding. Be careful brushing and flossing your teeth or using a toothpick because you may get an infection or bleed more easily. If you have any dental work done, tell your dentist you are receiving this medicine. Avoid taking products that contain aspirin, acetaminophen, ibuprofen, naproxen, or ketoprofen unless instructed by your doctor. These medicines may hide a fever. Do not become pregnant while taking this medicine or for 6 months after stopping it. Women should inform their doctor if they wish to become pregnant or think they might be pregnant. Men should not father a child while taking this medicine and for 3 months after stopping it. There is a potential for serious side effects to an unborn child. Talk to your health care professional or pharmacist for more information. Do not breast-feed an infant while taking this medicine or for at least 1 week after stopping it. Men should inform their doctors if they wish to father a child. This medicine may lower sperm counts. Talk with your doctor or health care professional if you are concerned about your fertility. What side effects may I notice from receiving this medicine? Side effects that you should report to your doctor or health care professional as soon as possible:  allergic reactions like skin rash, itching or hives, swelling of the face, lips, or tongue  breathing problems  pain, redness, or irritation at site where injected  signs and symptoms of a dangerous change in heartbeat or heart rhythm like chest pain; dizziness; fast or irregular heartbeat; palpitations; feeling faint or lightheaded, falls; breathing problems  signs of decreased platelets or bleeding - bruising, pinpoint red spots on the skin, black, tarry stools, blood in the urine  signs of decreased red blood cells -  unusually weak or tired, feeling faint or lightheaded, falls  signs of infection - fever or chills, cough, sore throat, pain or difficulty passing urine  signs and symptoms of kidney injury like trouble passing urine or change in the amount of urine  signs and symptoms of liver injury like dark yellow or brown urine; general ill feeling or flu-like symptoms; light-colored stools; loss of appetite; nausea; right upper belly pain; unusually weak or tired; yellowing of the eyes or skin  swelling of ankles, feet, hands Side effects that usually do not require medical attention (report to your doctor or health care professional if they continue or are bothersome):  constipation  diarrhea  hair loss  loss of appetite  nausea  rash  vomiting This list may not describe all possible side effects. Call your doctor for medical advice about side effects. You may report side effects to FDA at 1-800-FDA-1088. Where should I keep my medicine? This drug is given in a hospital or clinic and will not be stored at home. NOTE: This sheet is a summary. It may not cover all possible information. If you have questions about this medicine, talk to your doctor, pharmacist, or health care provider.  2020 Elsevier/Gold Standard (2018-02-16 18:06:11)  Carboplatin injection What is this medicine? CARBOPLATIN (KAR boe pla tin) is a chemotherapy drug. It targets  fast dividing cells, like cancer cells, and causes these cells to die. This medicine is used to treat ovarian cancer and many other cancers. This medicine may be used for other purposes; ask your health care provider or pharmacist if you have questions. COMMON BRAND NAME(S): Paraplatin What should I tell my health care provider before I take this medicine? They need to know if you have any of these conditions:  blood disorders  hearing problems  kidney disease  recent or ongoing radiation therapy  an unusual or allergic reaction to  carboplatin, cisplatin, other chemotherapy, other medicines, foods, dyes, or preservatives  pregnant or trying to get pregnant  breast-feeding How should I use this medicine? This drug is usually given as an infusion into a vein. It is administered in a hospital or clinic by a specially trained health care professional. Talk to your pediatrician regarding the use of this medicine in children. Special care may be needed. Overdosage: If you think you have taken too much of this medicine contact a poison control center or emergency room at once. NOTE: This medicine is only for you. Do not share this medicine with others. What if I miss a dose? It is important not to miss a dose. Call your doctor or health care professional if you are unable to keep an appointment. What may interact with this medicine?  medicines for seizures  medicines to increase blood counts like filgrastim, pegfilgrastim, sargramostim  some antibiotics like amikacin, gentamicin, neomycin, streptomycin, tobramycin  vaccines Talk to your doctor or health care professional before taking any of these medicines:  acetaminophen  aspirin  ibuprofen  ketoprofen  naproxen This list may not describe all possible interactions. Give your health care provider a list of all the medicines, herbs, non-prescription drugs, or dietary supplements you use. Also tell them if you smoke, drink alcohol, or use illegal drugs. Some items may interact with your medicine. What should I watch for while using this medicine? Your condition will be monitored carefully while you are receiving this medicine. You will need important blood work done while you are taking this medicine. This drug may make you feel generally unwell. This is not uncommon, as chemotherapy can affect healthy cells as well as cancer cells. Report any side effects. Continue your course of treatment even though you feel ill unless your doctor tells you to stop. In some  cases, you may be given additional medicines to help with side effects. Follow all directions for their use. Call your doctor or health care professional for advice if you get a fever, chills or sore throat, or other symptoms of a cold or flu. Do not treat yourself. This drug decreases your body's ability to fight infections. Try to avoid being around people who are sick. This medicine may increase your risk to bruise or bleed. Call your doctor or health care professional if you notice any unusual bleeding. Be careful brushing and flossing your teeth or using a toothpick because you may get an infection or bleed more easily. If you have any dental work done, tell your dentist you are receiving this medicine. Avoid taking products that contain aspirin, acetaminophen, ibuprofen, naproxen, or ketoprofen unless instructed by your doctor. These medicines may hide a fever. Do not become pregnant while taking this medicine. Women should inform their doctor if they wish to become pregnant or think they might be pregnant. There is a potential for serious side effects to an unborn child. Talk to your health care professional or  pharmacist for more information. Do not breast-feed an infant while taking this medicine. What side effects may I notice from receiving this medicine? Side effects that you should report to your doctor or health care professional as soon as possible:  allergic reactions like skin rash, itching or hives, swelling of the face, lips, or tongue  signs of infection - fever or chills, cough, sore throat, pain or difficulty passing urine  signs of decreased platelets or bleeding - bruising, pinpoint red spots on the skin, black, tarry stools, nosebleeds  signs of decreased red blood cells - unusually weak or tired, fainting spells, lightheadedness  breathing problems  changes in hearing  changes in vision  chest pain  high blood pressure  low blood counts - This drug may decrease  the number of white blood cells, red blood cells and platelets. You may be at increased risk for infections and bleeding.  nausea and vomiting  pain, swelling, redness or irritation at the injection site  pain, tingling, numbness in the hands or feet  problems with balance, talking, walking  trouble passing urine or change in the amount of urine Side effects that usually do not require medical attention (report to your doctor or health care professional if they continue or are bothersome):  hair loss  loss of appetite  metallic taste in the mouth or changes in taste This list may not describe all possible side effects. Call your doctor for medical advice about side effects. You may report side effects to FDA at 1-800-FDA-1088. Where should I keep my medicine? This drug is given in a hospital or clinic and will not be stored at home. NOTE: This sheet is a summary. It may not cover all possible information. If you have questions about this medicine, talk to your doctor, pharmacist, or health care provider.  2020 Elsevier/Gold Standard (2008-02-28 14:38:05)

## 2019-09-20 ENCOUNTER — Telehealth: Payer: Self-pay

## 2019-09-20 NOTE — Telephone Encounter (Signed)
Attempted to call.  Voicemail not set up

## 2019-09-20 NOTE — Telephone Encounter (Signed)
-----   Message from Ishmael Holter, RN sent at 09/19/2019  4:26 PM EDT ----- Regarding: Dr. Alvy Bimler !st tx f/u call Dr. Alvy Bimler 1st tx f/u call

## 2019-09-26 ENCOUNTER — Encounter: Payer: Self-pay | Admitting: Hematology and Oncology

## 2019-09-26 ENCOUNTER — Inpatient Hospital Stay: Payer: Medicare Other

## 2019-09-26 ENCOUNTER — Other Ambulatory Visit: Payer: Self-pay

## 2019-09-26 ENCOUNTER — Inpatient Hospital Stay (HOSPITAL_BASED_OUTPATIENT_CLINIC_OR_DEPARTMENT_OTHER): Payer: Medicare Other | Admitting: Hematology and Oncology

## 2019-09-26 DIAGNOSIS — C561 Malignant neoplasm of right ovary: Secondary | ICD-10-CM

## 2019-09-26 DIAGNOSIS — C800 Disseminated malignant neoplasm, unspecified: Secondary | ICD-10-CM

## 2019-09-26 DIAGNOSIS — F411 Generalized anxiety disorder: Secondary | ICD-10-CM

## 2019-09-26 DIAGNOSIS — R4702 Dysphasia: Secondary | ICD-10-CM | POA: Diagnosis not present

## 2019-09-26 DIAGNOSIS — D61818 Other pancytopenia: Secondary | ICD-10-CM | POA: Diagnosis not present

## 2019-09-26 DIAGNOSIS — Z5111 Encounter for antineoplastic chemotherapy: Secondary | ICD-10-CM | POA: Diagnosis not present

## 2019-09-26 DIAGNOSIS — C562 Malignant neoplasm of left ovary: Secondary | ICD-10-CM | POA: Diagnosis not present

## 2019-09-26 DIAGNOSIS — C563 Malignant neoplasm of bilateral ovaries: Secondary | ICD-10-CM

## 2019-09-26 DIAGNOSIS — Z7189 Other specified counseling: Secondary | ICD-10-CM

## 2019-09-26 LAB — CBC WITH DIFFERENTIAL (CANCER CENTER ONLY)
Abs Immature Granulocytes: 0 10*3/uL (ref 0.00–0.07)
Basophils Absolute: 0 10*3/uL (ref 0.0–0.1)
Basophils Relative: 0 %
Eosinophils Absolute: 0 10*3/uL (ref 0.0–0.5)
Eosinophils Relative: 1 %
HCT: 31.7 % — ABNORMAL LOW (ref 36.0–46.0)
Hemoglobin: 11.1 g/dL — ABNORMAL LOW (ref 12.0–15.0)
Immature Granulocytes: 0 %
Lymphocytes Relative: 59 %
Lymphs Abs: 1.5 10*3/uL (ref 0.7–4.0)
MCH: 40.4 pg — ABNORMAL HIGH (ref 26.0–34.0)
MCHC: 35 g/dL (ref 30.0–36.0)
MCV: 115.3 fL — ABNORMAL HIGH (ref 80.0–100.0)
Monocytes Absolute: 0.1 10*3/uL (ref 0.1–1.0)
Monocytes Relative: 4 %
Neutro Abs: 0.9 10*3/uL — ABNORMAL LOW (ref 1.7–7.7)
Neutrophils Relative %: 36 %
Platelet Count: 134 10*3/uL — ABNORMAL LOW (ref 150–400)
RBC: 2.75 MIL/uL — ABNORMAL LOW (ref 3.87–5.11)
RDW: 15.5 % (ref 11.5–15.5)
WBC Count: 2.6 10*3/uL — ABNORMAL LOW (ref 4.0–10.5)
nRBC: 0 % (ref 0.0–0.2)

## 2019-09-26 LAB — CMP (CANCER CENTER ONLY)
ALT: 44 U/L (ref 0–44)
AST: 30 U/L (ref 15–41)
Albumin: 3.3 g/dL — ABNORMAL LOW (ref 3.5–5.0)
Alkaline Phosphatase: 139 U/L — ABNORMAL HIGH (ref 38–126)
Anion gap: 8 (ref 5–15)
BUN: 13 mg/dL (ref 8–23)
CO2: 28 mmol/L (ref 22–32)
Calcium: 9.2 mg/dL (ref 8.9–10.3)
Chloride: 105 mmol/L (ref 98–111)
Creatinine: 0.74 mg/dL (ref 0.44–1.00)
GFR, Est AFR Am: >60 mL/min (ref >60)
GFR, Estimated: >60 mL/min (ref >60)
Glucose, Bld: 112 mg/dL — ABNORMAL HIGH (ref 70–99)
Potassium: 4 mmol/L (ref 3.5–5.1)
Sodium: 141 mmol/L (ref 135–145)
Total Bilirubin: 0.5 mg/dL (ref 0.3–1.2)
Total Protein: 6.7 g/dL (ref 6.5–8.1)

## 2019-09-26 MED ORDER — PROCHLORPERAZINE MALEATE 10 MG PO TABS
ORAL_TABLET | ORAL | Status: AC
  Filled 2019-09-26: qty 1

## 2019-09-26 MED ORDER — SODIUM CHLORIDE 0.9% FLUSH
10.0000 mL | INTRAVENOUS | Status: DC | PRN
  Administered 2019-09-26: 10 mL
  Filled 2019-09-26: qty 10

## 2019-09-26 MED ORDER — PROCHLORPERAZINE MALEATE 10 MG PO TABS
10.0000 mg | ORAL_TABLET | Freq: Once | ORAL | Status: AC
  Administered 2019-09-26: 10 mg via ORAL

## 2019-09-26 MED ORDER — HEPARIN SOD (PORK) LOCK FLUSH 100 UNIT/ML IV SOLN
500.0000 [IU] | Freq: Once | INTRAVENOUS | Status: AC | PRN
  Administered 2019-09-26: 17:00:00 500 [IU]
  Filled 2019-09-26: qty 5

## 2019-09-26 MED ORDER — SODIUM CHLORIDE 0.9 % IV SOLN
600.0000 mg/m2 | Freq: Once | INTRAVENOUS | Status: AC
  Administered 2019-09-26: 1254 mg via INTRAVENOUS
  Filled 2019-09-26: qty 32.98

## 2019-09-26 MED ORDER — SODIUM CHLORIDE 0.9 % IV SOLN
Freq: Once | INTRAVENOUS | Status: AC
  Administered 2019-09-26: 15:00:00 via INTRAVENOUS
  Filled 2019-09-26: qty 250

## 2019-09-26 NOTE — Assessment & Plan Note (Signed)
She was prescribed Paxil by her primary care doctor Her daughter felt that the dose need to be adjusted According to the guidelines, she is already onhigh-dose at 40 mg Recommend she makes appointment to see her primary care doctor for medication adjustment or to add different treatment to control her anxiety

## 2019-09-26 NOTE — Assessment & Plan Note (Signed)
So far, she tolerated treatment well except for mild pancytopenia I plan to reduce gemcitabine to 600 mg per metered square to keep her on days 1 and 8 for cycle of every 21 days Recommend minimum 3 months of treatment before repeat imaging study Her daughter is updated over the phone

## 2019-09-26 NOTE — Patient Instructions (Signed)
[facility] Discharge Instructions for Patients Receiving Chemotherapy  Today you received the following chemotherapy agents :  Gemzar  To help prevent nausea and vomiting after your treatment, we encourage you to take your nausea medication.   If you develop nausea and vomiting that is not controlled by your nausea medication, call the clinic.   BELOW ARE SYMPTOMS THAT SHOULD BE REPORTED IMMEDIATELY:  *FEVER GREATER THAN 100.5 F  *CHILLS WITH OR WITHOUT FEVER  NAUSEA AND VOMITING THAT IS NOT CONTROLLED WITH YOUR NAUSEA MEDICATION  *UNUSUAL SHORTNESS OF BREATH  *UNUSUAL BRUISING OR BLEEDING  TENDERNESS IN MOUTH AND THROAT WITH OR WITHOUT PRESENCE OF ULCERS  *URINARY PROBLEMS  *BOWEL PROBLEMS  UNUSUAL RASH Items with * indicate a potential emergency and should be followed up as soon as possible.  Feel free to call the clinic should you have any questions or concerns. The clinic phone number is [phone].  Please show the CHEMO ALERT CARD at check-in to the Emergency Department and triage nurse.   

## 2019-09-26 NOTE — Progress Notes (Signed)
[place] OFFICE PROGRESS NOTE  Patient Care Team: Helane Rima, MD as PCP - General (Family Medicine) Encarnacion Slates, MD as Referring Physician (Neurology) Cameron Sprang, MD as Consulting Physician (Neurology)  ASSESSMENT & PLAN:  Ovarian cancer [facility]) So far, she tolerated treatment well except for mild pancytopenia I plan to reduce gemcitabine to 600 mg per metered square to keep her on days 1 and 8 for cycle of every 21 days Recommend minimum 3 months of treatment before repeat imaging study Her daughter is updated over the phone  Pancytopenia, acquired [facility]) She is not symptomatic This is due to treatment Plan to reduce the dose of gemcitabine a little bit to keep her on schedule She does not need transfusion support  Expressive dysphasia She has chronic expressive dysphagia due to history of stroke I continue to update her daughter over the phone with each visit to keep her in the loop about plan of care  Generalized anxiety disorder She was prescribed Paxil by her primary care doctor Her daughter felt that the dose need to be adjusted According to the guidelines, she is already onhigh-dose at 40 mg Recommend she makes appointment to see her primary care doctor for medication adjustment or to add different treatment to control her anxiety    No orders of the defined types were placed in this encounter.   INTERVAL HISTORY: Please see below for problem oriented charting. She returns for treatment on cycle 1, day 8 Her daughter, Kenney Houseman is available to collaborate her history over the phone She noticed that the patient has significant anxiety and request the dose of Paxil to be adjusted She denies nausea or vomiting No changes in bowel habits She denies significant changes in her pain No recent infection, fever or chills The patient denies any recent signs or symptoms of bleeding such as spontaneous epistaxis, hematuria or hematochezia.   SUMMARY OF  ONCOLOGIC HISTORY: Oncology History Overview Note  Negative genetic testing Progressed on Niraparib   Carcinomatosis ([place])  12/06/2015 Initial Diagnosis   Carcinomatosis ([place])   Ovarian cancer, bilateral ([place])  12/24/2015 Initial Diagnosis   Ovarian cancer, bilateral ([place])   Ovarian cancer ([place])  11/28/2015 Imaging   Ct scan of abdomen: Peritoneal carcinomatosis and pelvic/inguinal adenopathy. Gynecologic primary is favored.   12/17/2015 Pathology Results   Lymph node, needle/core biopsy, L inguinal LAN - METASTATIC ADENOCARCINOMA. Microscopic Comment Immunohistochemistry will be performed and reported as an addendum. (JDP:ecj 12/18/2015) ADDENDUM: Immunohistochemistry shows strong positivity with cytokeratin 7, estrogen receptor (ER), progesterone receptor (PR), p53 and WT1. Negative markers are cytokeratin 20 , CDX- 2 and gross cystic disease fluid protein. 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 (JDP:kh 12-19-15   12/17/2015 Procedure   Technically successful ultrasound guided core left inguinal adenopathy biopsy   12/24/2015 Tumor Marker   Patient's tumor was tested for the following markers: CA-125 Results of the tumor marker test revealed 608.2   12/27/2015 - 02/28/2016 Chemotherapy   She received neoadjuvant chemo x 3 cycles   01/01/2016 Tumor Marker   Patient's tumor was tested for the following markers: CA-125 Results of the tumor marker test revealed 741.6   01/29/2016 Procedure   Successful placement of a right internal jugular approach power injectable Port-A-Cath. The catheter is ready for immediate use   01/29/2016 Imaging   US abdomen 1. Hyperechoic 2.5 x 1.0 x 1.6 cm lesion in the region the caudate lobe of  the liver. Similar finding noted on prior CT of 11/28/2015. This could represent hemangioma. This could represent a malignancy including metastatic disease. 2.  Exam otherwise unremarkable. No gallstones. No biliary distention.   02/03/2016 Tumor Marker   Patient's tumor was tested for the following markers: CA-125 Results of the tumor marker test revealed 137.1   02/20/2016 Imaging   MRI brain No acute infarct.  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  No intracranial mass.  Moderate small vessel disease changes.  Global atrophy without hydrocephalus.  Expanded partially empty sella without secondary findings of pseudotumor cerebri.  Minimal mucosal thickening ethmoid sinus air cells. Small air-fluid levels maxillary sinuses bilaterally may indicate changes of acute sinusitis.    02/28/2016 Tumor Marker   Patient's tumor was tested for the following markers: CA-125 Results of the tumor marker test revealed 33.6   03/02/2016 Imaging   CT chest, abdomen and pelvis 1. Today's study demonstrates a positive response to therapy with resolution of the previously noted malignant ascites, significant regression of previously noted peritoneal implants, and regression of previously noted lymphadenopathy in the abdomen and pelvis. 2. No definite evidence to suggest metastatic disease to the thorax. 3. Stable 1.0 x 1.7 cm intermediate attenuation lesion associated with the posterior aspect of segment 1 of the liver, favored to represent a mildly proteinaceous hepatic cyst. The possibility of a peritoneal implant in this region is not entirely excluded, but is not favored on today's examination. 4. Extensive post infectious scarring inguinal upper right lung, likely sequela of prior necrotizing pneumonia. 5. Multiple tiny pulmonary nodules scattered throughout the lungs bilaterally all measuring 4 mm or less. These are nonspecific, but favored to be benign. Given the patient's history of primary gynecologic malignancy, attention on followup studies is recommended to ensure the stability of these findings. 6. Additional  incidental findings, as above   03/14/2016 Imaging   CT angiogram 1. No pulmonary emboli. 2. Chronic changes to the right upper lobe. 3. Stable lesion in the caudate lobe of the liver. 4. Stable soft tissue prominence to the right of the trachea as described above. 5. Stable small nodule in the right lung.    03/24/2016 Pathology Results   1. Omentum, resection for tumor - FOCAL ADENOCARCINOMA ASSOCIATED WITH EXTENSIVE FIBROSIS, INFLAMMATION AND HEMOSIDERIN DEPOSITION. 2. Uterus +/- tubes/ovaries, neoplastic, cervix - CERVIX: SLIGHT CERVICITIS AND SQUAMOUS METAPLASIA. - ENDOMETRIUM: ATROPHIC, NO HYPERPLASIA OR MALIGNANCY. - MYOMETRIUM: LEIOMYOMATA WITH DEGENERATIVE CHANGES. NO MALIGNANCY. - UTERINE SEROSA: FOCAL ADENOCARCINOMA ASSOCIATED WITH ADHESIONS. - RIGHT OVARY: BENIGN CALCIFIED NODULE AND BENIGN SEROUS CYST. NO MALIGNANCY IDENTIFIED. - RIGHT FALLOPIAN TUBE: UNREMARKABLE. NO MALIGNANCY IDENTIFIED. - LEFT OVARY: FOCAL ADENOCARCINOMA ASSOCIATED WITH EXTENSIVE FIBROSIS. - LEFT FALLOPIAN TUBE: HYDROSALPINX. NO MALIGNANCY IDENTIFIED. 3. Lymph node, biopsy, right external iliac - METASTATIC ADENOCARCINOMA, 4. Soft tissue, biopsy, left para colic gutter - FOCAL ADENOCARCINOMA ASSOCIATED WITH FIBROSIS. Microscopic Comment 2. OVARY Specimen(s): Uterus with bilateral ovaries, omentum, right external iliac lymph node and left paracolic gutter. Procedure: (including lymph node sampling) Hysterectomy with bilateral salpingo-oophorectomy, omentectomy, lymph node biopsy and paracolic gutter biopsy. Primary tumor site (including laterality): Left ovary. Ovarian surface involvement: Yes Ovarian capsule intact without fragmentation: N/A Maximum tumor size (cm): 2 cm, 2 cm Histologic type: Serous carcinoma. Grade: 3 Peritoneal implants: (specify invasive or non-invasive): Left paracolic gutter positive for carcinoma Pelvic extension (list additional structures on separate lines and if  involved): Omentum, right paracolic gutter and uterine serosa. Lymph nodes: number examined 1 ; number positive 1  TNM code: ypT3a, ypN1b, ypMX FIGO Stage (based on pathologic findings, needs clinical correlation): IIIA2 Comments: The left ovary has a 2 cm nodule, which is composed primarily of fibrous tissue with small foci of residual adenocarcinoma. There is also microscopic involvement of the uterine serosa, the left paracolic gutter biopsy and the omentum. The left paracolic gutter and omental involvement is microscopic and both are associated with extensive fibrosis with focal hemosiderin deposition. The right external iliac node is completely replaced by metastatic adenocarcinoma with serous features. (JDP:ecj 03/30/2016)   03/24/2016 Surgery   Operation: Robotic-assisted laparoscopic total hysterectomy with bilateral salpingoophorectomy, omentectomy, radical tumor debulking  Surgeon: Donaciano Eva  Operative Findings:  : small nodules in omentum, no ascites. Omental nodule (2cm) adherent to lower anterior abdominal wall. 2cm left colonic gutter cystic nodule. 6cm right external iliac lymph node. Grossly normal ovaries and tubes. Fibroid uterus. No gross residual disease at completion of surgery representing R0 optimal/complete resection.    04/17/2016 Imaging   CT abdomen and pelvis 1. There is no evidence of acute inflammatory process within abdomen or pelvis. 2. Stable low-density lesion within caudate lobe of the liver. No new hepatic lesions are noted. 3. Again noted lobulated renal contour and multifocal renal cortical scarring. No hydronephrosis or hydroureter. 4. No significant mesenteric adenopathy. No retroperitoneal adenopathy. Stable minimal residual peritoneal thickening within pelvis. No new pelvic implants or pelvic ascites. 5. Status post hysterectomy.  Unremarkable urinary bladder. 6. Moderate stool within colon as described above. No evidence of colitis.     04/24/2016 - 06/19/2016 Chemotherapy   She received 3 more cycles of chemo after surgery   05/14/2016 Tumor Marker   Patient's tumor was tested for the following markers: CA-125 Results of the tumor marker test revealed 22.5   06/04/2016 Tumor Marker   Patient's tumor was tested for the following markers: CA-125 Results of the tumor marker test revealed 13.7   07/07/2016 Genetic Testing   Patient has genetic testing done for breast/ovasrian cancer panel Results revealed patient has no actionable mutation   07/23/2016 Imaging   Ct abdomen and pelvis 1. Heterogeneously enhancing 4.9 x 3.2 x 4.4 cm mass along the right pelvic sidewall may represent locally recurrent disease or malignant lymphadenopathy. 2. No other signs of definite metastatic disease noted elsewhere in the abdomen or pelvis. 3. Stable low-attenuation hepatic lesion in the caudate lobe of the liver, which appears to demonstrates some progressive centripetal filling on delayed images. This lesion is presumably benign given its stability compared to prior studies, and is favored to represent a small cavernous hemangioma. 4. Cardiomegaly with biatrial dilatation, which is very severe on the right side. 5. Aortic atherosclerosis. 6. Additional incidental findings, as above.   07/23/2016 Tumor Marker   Patient's tumor was tested for the following markers: CA-125 Results of the tumor marker test revealed 12.3   09/30/2016 Imaging   Ct abdomen and pelvis: 1. 4.9 cm heterogeneously enhancing mass seen along the right pelvic sidewall previously has almost completely resolved. There is some ill-defined soft tissue attenuation/peritoneal thickening in this region today but no discrete measurable lesion is evident. 2. No new or progressive findings on today's exam. 3. Stable hypo attenuating lesion in the caudate lobe of the liver. 4. Subtle aortic atherosclerosis   09/30/2016 Tumor Marker   Patient's tumor was tested for the  following markers: CA-125 Results of the tumor marker test revealed 9.6   10/05/2016 Pathology Results   Vagina, biopsy, left cuff - BENIGN FIBROEPITHELIAL (STROMAL)  POLYP. - NO DYSPLASIA, ATYPIA OR MALIGNANCY IDENTIFIED.   01/14/2017 Tumor Marker   Patient's tumor was tested for the following markers: CA-125 Results of the tumor marker test revealed 11.9   05/05/2017 Tumor Marker   Patient's tumor was tested for the following markers: CA-125 Results of the tumor marker test revealed 28   06/14/2017 Tumor Marker   Patient's tumor was tested for the following markers: CA-125 Results of the tumor marker test revealed 45.7   06/24/2017 Imaging   Ct abdomen and pelvis: Increased peritoneal metastatic disease in abdomen and pelvis since prior exam. No evidence of ascites. Stable small benign hepatic hemangioma.   07/21/2017 Tumor Marker   Patient's tumor was tested for the following markers: CA-125 Results of the tumor marker test revealed 84.1   07/21/2017 - 11/03/2017 Chemotherapy   She received carboplatin and taxol   08/11/2017 Adverse Reaction   She had severe neuropathy due to treatment. Does of chemo is reduced starting cycle 2 onwards   09/02/2017 Tumor Marker   Patient's tumor was tested for the following markers: CA-125 Results of the tumor marker test revealed 27.7   09/16/2017 Imaging   CT CHEST IMPRESSION  1. No acute process or evidence of metastatic disease in the chest. 2. Right upper lung bronchiectasis, consolidation, and architectural distortion are most likely post infectious or inflammatory and not significantly changed. 3. Aortic Atherosclerosis (ICD10-I70.0).  CT ABDOMEN AND PELVIS IMPRESSION  1. Response to therapy of nodal and peritoneal metastasis. 2. No new or progressive disease. 3. Caudate lobe hemangioma, as before. 4. Bilateral renal cortical thinning. Similar minimal right-sided caliectasis and hydroureter. Likely secondary to low-grade obstruction  by the dominant right pelvic implant.   09/22/2017 Tumor Marker   Patient's tumor was tested for the following markers: CA-125 Results of the tumor marker test revealed 18   11/03/2017 Tumor Marker   Patient's tumor was tested for the following markers: CA-125 Results of the tumor marker test revealed 13.4   11/25/2017 Imaging   1. Stable to slight interval decrease in size of nodal and peritoneal metastatic disease within the abdomen. 2. No new or progressive disease.   12/03/2017 - 09/11/2019 Chemotherapy   The patient is prescribed [place]   01/17/2018 Tumor Marker   Patient's tumor was tested for the following markers: CA-125 Results of the tumor marker test revealed 11.9   02/22/2018 Tumor Marker   Patient's tumor was tested for the following markers: CA-125 Results of the tumor marker test revealed 11.7   02/22/2018 Imaging   1. Improved nodular thickening along the vaginal cuff on the left and medial to the right iliac vessels, likely treated implants. No evidence of progressive peritoneal disease. 2. Stable hepatic hemangioma. No evidence of solid visceral organ metastases. 3. Bilateral renal cortical scarring.   05/19/2018 Tumor Marker   Patient's tumor was tested for the following markers: CA-125 Results of the tumor marker test revealed 9.9   08/10/2018 Imaging   Stable mild nodular thickening of the left vaginal cuff. No new or progressive disease within the abdomen or pelvis.  Stable small hepatic hemangioma.   09/21/2018 Tumor Marker   Patient's tumor was tested for the following markers: CA-125 Results of the tumor marker test revealed 10.3   11/11/2018 Tumor Marker   Patient's tumor was tested for the following markers: CA-125 Results of the tumor marker test revealed 10.5   12/15/2018 Tumor Marker   Patient's tumor was tested for the following markers: CA-125 Results of the tumor  marker test revealed 12.6   01/06/2019 Tumor Marker   Patient's tumor was tested  for the following markers: CA-125 Results of the tumor marker test revealed 14.6   01/12/2019 Imaging   1. Chronic extensive right upper lobe scarring changes and bronchiectasis. 2. No mediastinal or hilar mass or adenopathy and no findings for pulmonary metastatic disease. 3. Stable caudate lobe hemangioma. 4. No worrisome omental or peritoneal surface lesions. 5. Stable pericecal and small pelvic lymph nodes. No new or progressive findings.    09/11/2019 Imaging   Ct abdomen and pelvis Pericecal peritoneal metastatic implants   09/19/2019 -  Chemotherapy   The patient had carboplatin and gemxar for chemotherapy treatment.       REVIEW OF SYSTEMS:   Constitutional: Denies fevers, chills or abnormal weight loss Eyes: Denies blurriness of vision Ears, nose, mouth, throat, and face: Denies mucositis or sore throat Respiratory: Denies cough, dyspnea or wheezes Cardiovascular: Denies palpitation, chest discomfort or lower extremity swelling Gastrointestinal:  Denies nausea, heartburn or change in bowel habits Skin: Denies abnormal skin rashes Lymphatics: Denies new lymphadenopathy or easy bruising Neurological:Denies numbness, tingling or new weaknesses Behavioral/Psych: Mood is stable, no new changes  All other systems were reviewed with the patient and are negative.  I have reviewed the past medical history, past surgical history, social history and family history with the patient and they are unchanged from previous note.  ALLERGIES:  is allergic to codeine; lactulose; and latex.  MEDICATIONS:  Current Outpatient Medications  Medication Sig Dispense Refill  . apixaban (ELIQUIS) 5 MG TABS tablet Take 1 tablet (5 mg total) by mouth 2 (two) times daily. 60 tablet 5  . atorvastatin (LIPITOR) 40 MG tablet Take 40 mg by mouth at bedtime.    . diphenhydrAMINE (BENADRYL) 25 MG tablet Take 25 mg by mouth every 6 (six) hours as needed for allergies.    . fluticasone (FLONASE) 50  MCG/ACT nasal spray Place 2 sprays into both nostrils 2 (two) times daily.    Marland Kitchen gabapentin (NEURONTIN) 600 MG tablet Take 1 tablet (600 mg total) by mouth 2 (two) times daily. 60 tablet 11  . guaifenesin (ROBITUSSIN) 100 MG/5ML syrup Take 200 mg by mouth 3 (three) times daily as needed for cough.    . lidocaine-prilocaine (EMLA) cream Apply to Porta-Cath 1-2 hours prior to access as directed. (Patient not taking: Reported on 03/28/2019) 30 g 1  . metoprolol succinate (TOPROL-XL) 25 MG 24 hr tablet Take 25 mg by mouth daily.   5  . Multiple Vitamin (MULTIVITAMIN WITH MINERALS) TABS tablet Take 1 tablet by mouth daily.    Marland Kitchen omega-3 acid ethyl esters (LOVAZA) 1 g capsule Take 1 g by mouth daily.    . ondansetron (ZOFRAN ODT) 4 MG disintegrating tablet Take 1 tablet (4 mg total) by mouth every 8 (eight) hours as needed for nausea or vomiting. 60 tablet 9  . oxyCODONE (OXY IR/ROXICODONE) 5 MG immediate release tablet Take 1 tablet (5 mg total) by mouth every 6 (six) hours as needed for severe pain. 30 tablet 0  . PARoxetine (PAXIL) 40 MG tablet Take 40 mg by mouth daily.  0  . polyethylene glycol (MIRALAX / GLYCOLAX) packet Take 17 g by mouth daily.    . verapamil (CALAN-SR) 240 MG CR tablet Take 1 tablet (240 mg total) by mouth daily. 30 tablet 0   No current facility-administered medications for this visit.     PHYSICAL EXAMINATION: ECOG PERFORMANCE STATUS: 2 - Symptomatic, <50% confined  to bed  Vitals:   09/26/19 1332  BP: 139/66  Pulse: 67  Resp: 18  Temp: 98.1 F (36.7 C)  SpO2: 100%   Filed Weights   09/26/19 1332  Weight: 198 lb 3.2 oz (89.9 kg)    GENERAL:alert, no distress and comfortable SKIN: skin color, texture, turgor are normal, no rashes or significant lesions EYES: normal, Conjunctiva are pink and non-injected, sclera clear OROPHARYNX:no exudate, no erythema and lips, buccal mucosa, and tongue normal  NECK: supple, thyroid normal size, non-tender, without  nodularity LYMPH:  no palpable lymphadenopathy in the cervical, axillary or inguinal LUNGS: clear to auscultation and percussion with normal breathing effort HEART: regular rate & rhythm and no murmurs and no lower extremity edema ABDOMEN:abdomen soft, non-tender and normal bowel sounds Musculoskeletal:no cyanosis of digits and no clubbing  NEURO: alert & oriented x 3 with expressive dysphasia and left hemiparesis  LABORATORY DATA:  I have reviewed the data as listed    Component Value Date/Time   NA 141 09/26/2019 1318   NA 142 12/08/2017 1051   K 4.0 09/26/2019 1318   K 4.4 12/08/2017 1051   CL 105 09/26/2019 1318   CL 105 06/14/2017 1430   CO2 28 09/26/2019 1318   CO2 28 12/08/2017 1051   GLUCOSE 112 (H) 09/26/2019 1318   GLUCOSE 97 12/08/2017 1051   BUN 13 09/26/2019 1318   BUN 16.2 12/08/2017 1051   CREATININE 0.74 09/26/2019 1318   CREATININE 0.9 12/08/2017 1051   CALCIUM 9.2 09/26/2019 1318   CALCIUM 9.5 12/08/2017 1051   PROT 6.7 09/26/2019 1318   PROT 6.9 12/08/2017 1051   ALBUMIN 3.3 (L) 09/26/2019 1318   ALBUMIN 3.6 12/08/2017 1051   AST 30 09/26/2019 1318   AST 18 12/08/2017 1051   ALT 44 09/26/2019 1318   ALT 20 12/08/2017 1051   ALKPHOS 139 (H) 09/26/2019 1318   ALKPHOS 131 12/08/2017 1051   BILITOT 0.5 09/26/2019 1318   BILITOT 0.70 12/08/2017 1051   GFRNONAA >60 09/26/2019 1318   GFRAA >60 09/26/2019 1318    No results found for: SPEP, UPEP  Lab Results  Component Value Date   WBC 2.6 (L) 09/26/2019   NEUTROABS 0.9 (L) 09/26/2019   HGB 11.1 (L) 09/26/2019   HCT 31.7 (L) 09/26/2019   MCV 115.3 (H) 09/26/2019   PLT 134 (L) 09/26/2019      Chemistry      Component Value Date/Time   NA 141 09/26/2019 1318   NA 142 12/08/2017 1051   K 4.0 09/26/2019 1318   K 4.4 12/08/2017 1051   CL 105 09/26/2019 1318   CL 105 06/14/2017 1430   CO2 28 09/26/2019 1318   CO2 28 12/08/2017 1051   BUN 13 09/26/2019 1318   BUN 16.2 12/08/2017 1051    CREATININE 0.74 09/26/2019 1318   CREATININE 0.9 12/08/2017 1051      Component Value Date/Time   CALCIUM 9.2 09/26/2019 1318   CALCIUM 9.5 12/08/2017 1051   ALKPHOS 139 (H) 09/26/2019 1318   ALKPHOS 131 12/08/2017 1051   AST 30 09/26/2019 1318   AST 18 12/08/2017 1051   ALT 44 09/26/2019 1318   ALT 20 12/08/2017 1051   BILITOT 0.5 09/26/2019 1318   BILITOT 0.70 12/08/2017 1051       RADIOGRAPHIC STUDIES: I have personally reviewed the radiological images as listed and agreed with the findings in the report. Ct Abdomen Pelvis W Contrast  Result Date: 09/11/2019 CLINICAL DATA:  Epithelial ovarian/fallopian  tube/primary peritoneal cancer. Assess treatment response. EXAM: CT ABDOMEN AND PELVIS WITH CONTRAST TECHNIQUE: Multidetector CT imaging of the abdomen and pelvis was performed using the standard protocol following bolus administration of intravenous contrast. CONTRAST:  115m OMNIPAQUE IOHEXOL 300 MG/ML  SOLN COMPARISON:  01/12/2019. FINDINGS: Lower chest: Lung bases are clear. Heart is mildly enlarged. No pericardial pleural effusion. Distal esophagus is unremarkable. Hepatobiliary: 1.7 cm low-attenuation lesion in the caudate is unchanged and previously characterized as a hemangioma. Liver and gallbladder are otherwise unremarkable. Extrahepatic bile duct may be minimally prominent for age, measuring up to 8 mm. Pancreas: Negative. Spleen: Negative. Adrenals/Urinary Tract: Adrenal glands are unremarkable. Scarring in the kidneys bilaterally. Subcentimeter low-attenuation lesion in the right kidney is too small to characterize but statistically, a cyst is likely. Ureters are decompressed. Bladder is grossly unremarkable. Stomach/Bowel: Stomach, small bowel and colon are unremarkable. Appendix is not readily visualized. Stool is seen throughout the colon, indicative of constipation. Vascular/Lymphatic: Vascular structures are unremarkable. No pathologically enlarged lymph nodes. Reproductive:  Hysterectomy.  No adnexal mass. Other: Peritoneal nodule in the anterior right pelvis measures 2.1 x 2.6 cm (2/67). There may be soft tissue nodularity along the cecum (2/64 and 67). Ileocolic mesenteric lymph nodes measure up to 6 mm (2/55), similar. Additional nodules are seen in the inferior small bowel mesentery, measuring up to 7 mm (2/69), possibly new. No ascites. Musculoskeletal: Degenerative changes in the spine. No worrisome lytic or sclerotic lesions. IMPRESSION: Pericecal peritoneal metastatic implants, as discussed above. Electronically Signed   By: MLorin PicketM.D.   On: 09/11/2019 15:54    All questions were answered. The patient knows to call the clinic with any problems, questions or concerns. No barriers to learning was detected.  I spent 25 minutes counseling the patient face to face. The total time spent in the appointment was 30 minutes and more than 50% was on counseling and review of test results  NHeath Lark MD 09/26/2019 2:33 PM

## 2019-09-26 NOTE — Assessment & Plan Note (Signed)
She is not symptomatic This is due to treatment Plan to reduce the dose of gemcitabine a little bit to keep her on schedule She does not need transfusion support

## 2019-09-26 NOTE — Assessment & Plan Note (Signed)
She has chronic expressive dysphagia due to history of stroke I continue to update her daughter over the phone with each visit to keep her in the loop about plan of care

## 2019-09-27 ENCOUNTER — Telehealth: Payer: Self-pay | Admitting: Hematology and Oncology

## 2019-09-27 NOTE — Telephone Encounter (Signed)
Called daughter - no answer/ vmaill full and called pt - no answer and no vmail set up .   Scheduled appt per 10/20 sch message - unable to reach pt and message sent to MD and RN to let them know

## 2019-10-10 ENCOUNTER — Other Ambulatory Visit: Payer: Self-pay

## 2019-10-10 ENCOUNTER — Inpatient Hospital Stay: Payer: Medicare Other

## 2019-10-10 ENCOUNTER — Inpatient Hospital Stay: Payer: Medicare Other | Attending: Hematology and Oncology

## 2019-10-10 VITALS — BP 109/68 | HR 58 | Temp 98.3°F | Resp 16

## 2019-10-10 DIAGNOSIS — R5383 Other fatigue: Secondary | ICD-10-CM | POA: Insufficient documentation

## 2019-10-10 DIAGNOSIS — C562 Malignant neoplasm of left ovary: Secondary | ICD-10-CM

## 2019-10-10 DIAGNOSIS — C563 Malignant neoplasm of bilateral ovaries: Secondary | ICD-10-CM

## 2019-10-10 DIAGNOSIS — Z7901 Long term (current) use of anticoagulants: Secondary | ICD-10-CM | POA: Insufficient documentation

## 2019-10-10 DIAGNOSIS — Z79899 Other long term (current) drug therapy: Secondary | ICD-10-CM | POA: Diagnosis not present

## 2019-10-10 DIAGNOSIS — C800 Disseminated malignant neoplasm, unspecified: Secondary | ICD-10-CM

## 2019-10-10 DIAGNOSIS — Z7189 Other specified counseling: Secondary | ICD-10-CM

## 2019-10-10 DIAGNOSIS — C561 Malignant neoplasm of right ovary: Secondary | ICD-10-CM | POA: Diagnosis not present

## 2019-10-10 DIAGNOSIS — R109 Unspecified abdominal pain: Secondary | ICD-10-CM | POA: Insufficient documentation

## 2019-10-10 DIAGNOSIS — R1310 Dysphagia, unspecified: Secondary | ICD-10-CM | POA: Insufficient documentation

## 2019-10-10 DIAGNOSIS — I6782 Cerebral ischemia: Secondary | ICD-10-CM | POA: Diagnosis not present

## 2019-10-10 DIAGNOSIS — Z5111 Encounter for antineoplastic chemotherapy: Secondary | ICD-10-CM | POA: Insufficient documentation

## 2019-10-10 DIAGNOSIS — Z8673 Personal history of transient ischemic attack (TIA), and cerebral infarction without residual deficits: Secondary | ICD-10-CM | POA: Insufficient documentation

## 2019-10-10 DIAGNOSIS — D61818 Other pancytopenia: Secondary | ICD-10-CM | POA: Diagnosis not present

## 2019-10-10 LAB — CMP (CANCER CENTER ONLY)
ALT: 36 U/L (ref 0–44)
AST: 23 U/L (ref 15–41)
Albumin: 3.2 g/dL — ABNORMAL LOW (ref 3.5–5.0)
Alkaline Phosphatase: 136 U/L — ABNORMAL HIGH (ref 38–126)
Anion gap: 10 (ref 5–15)
BUN: 14 mg/dL (ref 8–23)
CO2: 25 mmol/L (ref 22–32)
Calcium: 8.8 mg/dL — ABNORMAL LOW (ref 8.9–10.3)
Chloride: 106 mmol/L (ref 98–111)
Creatinine: 0.81 mg/dL (ref 0.44–1.00)
GFR, Est AFR Am: >60 mL/min (ref >60)
GFR, Estimated: >60 mL/min (ref >60)
Glucose, Bld: 97 mg/dL (ref 70–99)
Potassium: 4 mmol/L (ref 3.5–5.1)
Sodium: 141 mmol/L (ref 135–145)
Total Bilirubin: 0.3 mg/dL (ref 0.3–1.2)
Total Protein: 6.4 g/dL — ABNORMAL LOW (ref 6.5–8.1)

## 2019-10-10 LAB — CBC WITH DIFFERENTIAL (CANCER CENTER ONLY)
Abs Immature Granulocytes: 0 10*3/uL (ref 0.00–0.07)
Basophils Absolute: 0 10*3/uL (ref 0.0–0.1)
Basophils Relative: 0 %
Eosinophils Absolute: 0.1 10*3/uL (ref 0.0–0.5)
Eosinophils Relative: 2 %
HCT: 25.5 % — ABNORMAL LOW (ref 36.0–46.0)
Hemoglobin: 8.8 g/dL — ABNORMAL LOW (ref 12.0–15.0)
Immature Granulocytes: 0 %
Lymphocytes Relative: 32 %
Lymphs Abs: 1.2 10*3/uL (ref 0.7–4.0)
MCH: 40.4 pg — ABNORMAL HIGH (ref 26.0–34.0)
MCHC: 34.5 g/dL (ref 30.0–36.0)
MCV: 117 fL — ABNORMAL HIGH (ref 80.0–100.0)
Monocytes Absolute: 0.6 10*3/uL (ref 0.1–1.0)
Monocytes Relative: 15 %
Neutro Abs: 1.9 10*3/uL (ref 1.7–7.7)
Neutrophils Relative %: 51 %
Platelet Count: 302 10*3/uL (ref 150–400)
RBC: 2.18 MIL/uL — ABNORMAL LOW (ref 3.87–5.11)
RDW: 15.9 % — ABNORMAL HIGH (ref 11.5–15.5)
WBC Count: 3.7 10*3/uL — ABNORMAL LOW (ref 4.0–10.5)
nRBC: 1.1 % — ABNORMAL HIGH (ref 0.0–0.2)

## 2019-10-10 MED ORDER — PALONOSETRON HCL INJECTION 0.25 MG/5ML
0.2500 mg | Freq: Once | INTRAVENOUS | Status: AC
  Administered 2019-10-10: 0.25 mg via INTRAVENOUS

## 2019-10-10 MED ORDER — SODIUM CHLORIDE 0.9 % IV SOLN
600.0000 mg/m2 | Freq: Once | INTRAVENOUS | Status: AC
  Administered 2019-10-10: 1254 mg via INTRAVENOUS
  Filled 2019-10-10: qty 32.98

## 2019-10-10 MED ORDER — SODIUM CHLORIDE 0.9 % IV SOLN
Freq: Once | INTRAVENOUS | Status: AC
  Administered 2019-10-10: 10:00:00 via INTRAVENOUS
  Filled 2019-10-10: qty 5

## 2019-10-10 MED ORDER — SODIUM CHLORIDE 0.9 % IV SOLN
398.0000 mg | Freq: Once | INTRAVENOUS | Status: AC
  Administered 2019-10-10: 400 mg via INTRAVENOUS
  Filled 2019-10-10: qty 40

## 2019-10-10 MED ORDER — SODIUM CHLORIDE 0.9 % IV SOLN
Freq: Once | INTRAVENOUS | Status: AC
  Administered 2019-10-10: 10:00:00 via INTRAVENOUS
  Filled 2019-10-10: qty 250

## 2019-10-10 MED ORDER — SODIUM CHLORIDE 0.9% FLUSH
10.0000 mL | INTRAVENOUS | Status: DC | PRN
  Administered 2019-10-10: 10 mL
  Filled 2019-10-10: qty 10

## 2019-10-10 MED ORDER — HEPARIN SOD (PORK) LOCK FLUSH 100 UNIT/ML IV SOLN
500.0000 [IU] | Freq: Once | INTRAVENOUS | Status: AC | PRN
  Administered 2019-10-10: 500 [IU]
  Filled 2019-10-10: qty 5

## 2019-10-10 MED ORDER — PALONOSETRON HCL INJECTION 0.25 MG/5ML
INTRAVENOUS | Status: AC
  Filled 2019-10-10: qty 5

## 2019-10-10 NOTE — Patient Instructions (Signed)
[facility] Discharge Instructions for Patients Receiving Chemotherapy  Today you received the following chemotherapy agents Gemcitabine (Gemzar) and Carboplatin  To help prevent nausea and vomiting after your treatment, we encourage you to take your nausea medication as directed.   If you develop nausea and vomiting that is not controlled by your nausea medication, call the clinic.   BELOW ARE SYMPTOMS THAT SHOULD BE REPORTED IMMEDIATELY:  *FEVER GREATER THAN 100.5 F  *CHILLS WITH OR WITHOUT FEVER  NAUSEA AND VOMITING THAT IS NOT CONTROLLED WITH YOUR NAUSEA MEDICATION  *UNUSUAL SHORTNESS OF BREATH  *UNUSUAL BRUISING OR BLEEDING  TENDERNESS IN MOUTH AND THROAT WITH OR WITHOUT PRESENCE OF ULCERS  *URINARY PROBLEMS  *BOWEL PROBLEMS  UNUSUAL RASH Items with * indicate a potential emergency and should be followed up as soon as possible.  Feel free to call the clinic should you have any questions or concerns. The clinic phone number is [phone].  Please show the [place] at check-in to the Emergency Department and triage nurse.

## 2019-10-17 ENCOUNTER — Other Ambulatory Visit: Payer: Self-pay

## 2019-10-17 ENCOUNTER — Inpatient Hospital Stay (HOSPITAL_BASED_OUTPATIENT_CLINIC_OR_DEPARTMENT_OTHER): Payer: Medicare Other | Admitting: Hematology and Oncology

## 2019-10-17 ENCOUNTER — Inpatient Hospital Stay: Payer: Medicare Other

## 2019-10-17 ENCOUNTER — Encounter: Payer: Self-pay | Admitting: Hematology and Oncology

## 2019-10-17 ENCOUNTER — Other Ambulatory Visit: Payer: Self-pay | Admitting: Hematology and Oncology

## 2019-10-17 VITALS — BP 119/68 | HR 62 | Temp 97.9°F | Resp 18 | Ht 70.0 in | Wt 203.9 lb

## 2019-10-17 DIAGNOSIS — C561 Malignant neoplasm of right ovary: Secondary | ICD-10-CM

## 2019-10-17 DIAGNOSIS — C562 Malignant neoplasm of left ovary: Secondary | ICD-10-CM

## 2019-10-17 DIAGNOSIS — D539 Nutritional anemia, unspecified: Secondary | ICD-10-CM

## 2019-10-17 DIAGNOSIS — D61818 Other pancytopenia: Secondary | ICD-10-CM | POA: Diagnosis not present

## 2019-10-17 DIAGNOSIS — C563 Malignant neoplasm of bilateral ovaries: Secondary | ICD-10-CM

## 2019-10-17 DIAGNOSIS — Z7189 Other specified counseling: Secondary | ICD-10-CM

## 2019-10-17 DIAGNOSIS — C800 Disseminated malignant neoplasm, unspecified: Secondary | ICD-10-CM

## 2019-10-17 DIAGNOSIS — R4702 Dysphasia: Secondary | ICD-10-CM

## 2019-10-17 DIAGNOSIS — Z5111 Encounter for antineoplastic chemotherapy: Secondary | ICD-10-CM | POA: Diagnosis not present

## 2019-10-17 DIAGNOSIS — R1084 Generalized abdominal pain: Secondary | ICD-10-CM

## 2019-10-17 LAB — CBC WITH DIFFERENTIAL (CANCER CENTER ONLY)
Abs Immature Granulocytes: 0.03 10*3/uL (ref 0.00–0.07)
Basophils Absolute: 0 10*3/uL (ref 0.0–0.1)
Basophils Relative: 0 %
Eosinophils Absolute: 0 10*3/uL (ref 0.0–0.5)
Eosinophils Relative: 0 %
HCT: 26.3 % — ABNORMAL LOW (ref 36.0–46.0)
Hemoglobin: 9 g/dL — ABNORMAL LOW (ref 12.0–15.0)
Immature Granulocytes: 1 %
Lymphocytes Relative: 45 %
Lymphs Abs: 1.3 10*3/uL (ref 0.7–4.0)
MCH: 39.3 pg — ABNORMAL HIGH (ref 26.0–34.0)
MCHC: 34.2 g/dL (ref 30.0–36.0)
MCV: 114.8 fL — ABNORMAL HIGH (ref 80.0–100.0)
Monocytes Absolute: 0.3 10*3/uL (ref 0.1–1.0)
Monocytes Relative: 9 %
Neutro Abs: 1.3 10*3/uL — ABNORMAL LOW (ref 1.7–7.7)
Neutrophils Relative %: 45 %
Platelet Count: 305 10*3/uL (ref 150–400)
RBC: 2.29 MIL/uL — ABNORMAL LOW (ref 3.87–5.11)
RDW: 15.9 % — ABNORMAL HIGH (ref 11.5–15.5)
WBC Count: 3 10*3/uL — ABNORMAL LOW (ref 4.0–10.5)
nRBC: 0.7 % — ABNORMAL HIGH (ref 0.0–0.2)

## 2019-10-17 LAB — CMP (CANCER CENTER ONLY)
ALT: 55 U/L — ABNORMAL HIGH (ref 0–44)
AST: 23 U/L (ref 15–41)
Albumin: 3.4 g/dL — ABNORMAL LOW (ref 3.5–5.0)
Alkaline Phosphatase: 132 U/L — ABNORMAL HIGH (ref 38–126)
Anion gap: 10 (ref 5–15)
BUN: 11 mg/dL (ref 8–23)
CO2: 26 mmol/L (ref 22–32)
Calcium: 8.8 mg/dL — ABNORMAL LOW (ref 8.9–10.3)
Chloride: 107 mmol/L (ref 98–111)
Creatinine: 0.67 mg/dL (ref 0.44–1.00)
GFR, Est AFR Am: >60 mL/min (ref >60)
GFR, Estimated: >60 mL/min (ref >60)
Glucose, Bld: 98 mg/dL (ref 70–99)
Potassium: 3.8 mmol/L (ref 3.5–5.1)
Sodium: 143 mmol/L (ref 135–145)
Total Bilirubin: 0.3 mg/dL (ref 0.3–1.2)
Total Protein: 6.4 g/dL — ABNORMAL LOW (ref 6.5–8.1)

## 2019-10-17 LAB — IRON AND TIBC
Iron: 45 ug/dL (ref 41–142)
Saturation Ratios: 17 % — ABNORMAL LOW (ref 21–57)
TIBC: 260 ug/dL (ref 236–444)
UIBC: 215 ug/dL (ref 120–384)

## 2019-10-17 LAB — FERRITIN: Ferritin: 360 ng/mL — ABNORMAL HIGH (ref 11–307)

## 2019-10-17 LAB — VITAMIN B12: Vitamin B-12: 416 pg/mL (ref 180–914)

## 2019-10-17 MED ORDER — PROCHLORPERAZINE MALEATE 10 MG PO TABS
10.0000 mg | ORAL_TABLET | Freq: Once | ORAL | Status: AC
  Administered 2019-10-17: 10 mg via ORAL

## 2019-10-17 MED ORDER — SODIUM CHLORIDE 0.9 % IV SOLN
600.0000 mg/m2 | Freq: Once | INTRAVENOUS | Status: AC
  Administered 2019-10-17: 1254 mg via INTRAVENOUS
  Filled 2019-10-17: qty 32.98

## 2019-10-17 MED ORDER — HEPARIN SOD (PORK) LOCK FLUSH 100 UNIT/ML IV SOLN
500.0000 [IU] | Freq: Once | INTRAVENOUS | Status: AC | PRN
  Administered 2019-10-17: 500 [IU]
  Filled 2019-10-17: qty 5

## 2019-10-17 MED ORDER — PROCHLORPERAZINE MALEATE 10 MG PO TABS
ORAL_TABLET | ORAL | Status: AC
  Filled 2019-10-17: qty 1

## 2019-10-17 MED ORDER — SODIUM CHLORIDE 0.9 % IV SOLN
Freq: Once | INTRAVENOUS | Status: AC
  Administered 2019-10-17: 10:00:00 via INTRAVENOUS
  Filled 2019-10-17: qty 250

## 2019-10-17 MED ORDER — SODIUM CHLORIDE 0.9% FLUSH
10.0000 mL | INTRAVENOUS | Status: DC | PRN
  Administered 2019-10-17: 10 mL
  Filled 2019-10-17: qty 10

## 2019-10-17 NOTE — Progress Notes (Signed)
[facility] OFFICE PROGRESS NOTE  Patient Care Team: Helane Rima, MD as PCP - General (Family Medicine) Encarnacion Slates, MD as Referring Physician (Neurology) Cameron Sprang, MD as Consulting Physician (Neurology)  ASSESSMENT & PLAN:  Ovarian cancer [facility]) Overall, she tolerated treatment poorly with significant pancytopenia and GI issues Due to language barrier, I have to collaborate history with her daughter I plan to proceed with treatment today with similar dose adjustment as before but I plan to also reduce the dose of carboplatin for her next cycle of treatment After cycle 4, I plan to repeat imaging study for objective assessment of response to therapy  Pancytopenia, acquired ([place]) She have severe, persistent pancytopenia likely due to recent side effects of chemotherapy I plan to order serum vitamin B12 and iron studies for further evaluation  Abdominal pain She denies abdominal pain but her daughter stated that the patient has been complaining of abdominal pain She will continue oxycodone as prescribed  Expressive dysphasia She has expressive dysphagia from prior history of stroke I recommend her daughter to come with her for doctor's visit appointment in the future to facilitate accurate communication   Orders Placed This Encounter  Procedures  . Ferritin    Standing Status:   Future    Standing Expiration Date:   10/16/2020  . Iron and TIBC    Standing Status:   Future    Standing Expiration Date:   11/20/2020  . Vitamin B12    Standing Status:   Future    Standing Expiration Date:   11/20/2020    INTERVAL HISTORY: Please see below for problem oriented charting. She returns for chemotherapy and follow-up Family member is not available and I called her daughter after the visit collaborate the history with her The patient complained of fatigue She denies pain but her daughter stated she has been complaining of pain No recent constipation She had  loose stool yesterday The patient denies any recent signs or symptoms of bleeding such as spontaneous epistaxis, hematuria or hematochezia.   SUMMARY OF ONCOLOGIC HISTORY: Oncology History Overview Note  Negative genetic testing Progressed on [place]   Carcinomatosis ([place])  12/06/2015 Initial Diagnosis   Carcinomatosis ([place])   Ovarian cancer, bilateral ([place])  12/24/2015 Initial Diagnosis   Ovarian cancer, bilateral ([place])   Ovarian cancer ([place])  11/28/2015 Imaging   Ct scan of abdomen: Peritoneal carcinomatosis and pelvic/inguinal adenopathy. Gynecologic primary is favored.   12/17/2015 Pathology Results   Lymph node, needle/core biopsy, L inguinal LAN - METASTATIC ADENOCARCINOMA. Microscopic Comment Immunohistochemistry will be performed and reported as an addendum. (JDP:ecj 12/18/2015) ADDENDUM: Immunohistochemistry shows strong positivity with cytokeratin 7, estrogen receptor (ER), progesterone receptor (PR), p53 and WT1. Negative markers are cytokeratin 20 , CDX- 2 and gross cystic disease fluid protein. 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 (JDP:kh 12-19-15   12/17/2015 Procedure   Technically successful ultrasound guided core left inguinal adenopathy biopsy   12/24/2015 Tumor Marker   Patient's tumor was tested for the following markers: CA-125 Results of the tumor marker test revealed 608.2   12/27/2015 - 02/28/2016 Chemotherapy   She received neoadjuvant chemo x 3 cycles   01/01/2016 Tumor Marker   Patient's tumor was tested for the following markers: CA-125 Results of the tumor marker test revealed 741.6   01/29/2016 Procedure   Successful placement of a right internal jugular approach power injectable Port-A-Cath. The catheter is ready for immediate  use   01/29/2016 Imaging   US abdomen 1. Hyperechoic 2.5 x 1.0 x 1.6 cm lesion in the region the caudate lobe of the  liver. Similar finding noted on prior CT of 11/28/2015. This could represent hemangioma. This could represent a malignancy including metastatic disease. 2. Exam otherwise unremarkable. No gallstones. No biliary distention.   02/03/2016 Tumor Marker   Patient's tumor was tested for the following markers: CA-125 Results of the tumor marker test revealed 137.1   02/20/2016 Imaging   MRI brain No acute infarct.  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  No intracranial mass.  Moderate small vessel disease changes.  Global atrophy without hydrocephalus.  Expanded partially empty sella without secondary findings of pseudotumor cerebri.  Minimal mucosal thickening ethmoid sinus air cells. Small air-fluid levels maxillary sinuses bilaterally may indicate changes of acute sinusitis.    02/28/2016 Tumor Marker   Patient's tumor was tested for the following markers: CA-125 Results of the tumor marker test revealed 33.6   03/02/2016 Imaging   CT chest, abdomen and pelvis 1. Today's study demonstrates a positive response to therapy with resolution of the previously noted malignant ascites, significant regression of previously noted peritoneal implants, and regression of previously noted lymphadenopathy in the abdomen and pelvis. 2. No definite evidence to suggest metastatic disease to the thorax. 3. Stable 1.0 x 1.7 cm intermediate attenuation lesion associated with the posterior aspect of segment 1 of the liver, favored to represent a mildly proteinaceous hepatic cyst. The possibility of a peritoneal implant in this region is not entirely excluded, but is not favored on today's examination. 4. Extensive post infectious scarring inguinal upper right lung, likely sequela of prior necrotizing pneumonia. 5. Multiple tiny pulmonary nodules scattered throughout the lungs bilaterally all measuring 4 mm or less. These are nonspecific, but favored to be benign. Given the  patient's history of primary gynecologic malignancy, attention on followup studies is recommended to ensure the stability of these findings. 6. Additional incidental findings, as above   03/14/2016 Imaging   CT angiogram 1. No pulmonary emboli. 2. Chronic changes to the right upper lobe. 3. Stable lesion in the caudate lobe of the liver. 4. Stable soft tissue prominence to the right of the trachea as described above. 5. Stable small nodule in the right lung.    03/24/2016 Pathology Results   1. Omentum, resection for tumor - FOCAL ADENOCARCINOMA ASSOCIATED WITH EXTENSIVE FIBROSIS, INFLAMMATION AND HEMOSIDERIN DEPOSITION. 2. Uterus +/- tubes/ovaries, neoplastic, cervix - CERVIX: SLIGHT CERVICITIS AND SQUAMOUS METAPLASIA. - ENDOMETRIUM: ATROPHIC, NO HYPERPLASIA OR MALIGNANCY. - MYOMETRIUM: LEIOMYOMATA WITH DEGENERATIVE CHANGES. NO MALIGNANCY. - UTERINE SEROSA: FOCAL ADENOCARCINOMA ASSOCIATED WITH ADHESIONS. - RIGHT OVARY: BENIGN CALCIFIED NODULE AND BENIGN SEROUS CYST. NO MALIGNANCY IDENTIFIED. - RIGHT FALLOPIAN TUBE: UNREMARKABLE. NO MALIGNANCY IDENTIFIED. - LEFT OVARY: FOCAL ADENOCARCINOMA ASSOCIATED WITH EXTENSIVE FIBROSIS. - LEFT FALLOPIAN TUBE: HYDROSALPINX. NO MALIGNANCY IDENTIFIED. 3. Lymph node, biopsy, right external iliac - METASTATIC ADENOCARCINOMA, 4. Soft tissue, biopsy, left para colic gutter - FOCAL ADENOCARCINOMA ASSOCIATED WITH FIBROSIS. Microscopic Comment 2. OVARY Specimen(s): Uterus with bilateral ovaries, omentum, right external iliac lymph node and left paracolic gutter. Procedure: (including lymph node sampling) Hysterectomy with bilateral salpingo-oophorectomy, omentectomy, lymph node biopsy and paracolic gutter biopsy. Primary tumor site (including laterality): Left ovary. Ovarian surface involvement: Yes Ovarian capsule intact without fragmentation: N/A Maximum tumor size (cm): 2 cm, 2 cm Histologic type: Serous carcinoma. Grade: 3 Peritoneal implants:  (specify invasive or non-invasive): Left paracolic gutter positive for carcinoma Pelvic extension (  list additional structures on separate lines and if involved): Omentum, right paracolic gutter and uterine serosa. Lymph nodes: number examined 1 ; number positive 1 TNM code: ypT3a, ypN1b, ypMX FIGO Stage (based on pathologic findings, needs clinical correlation): IIIA2 Comments: The left ovary has a 2 cm nodule, which is composed primarily of fibrous tissue with small foci of residual adenocarcinoma. There is also microscopic involvement of the uterine serosa, the left paracolic gutter biopsy and the omentum. The left paracolic gutter and omental involvement is microscopic and both are associated with extensive fibrosis with focal hemosiderin deposition. The right external iliac node is completely replaced by metastatic adenocarcinoma with serous features. (JDP:ecj 03/30/2016)   03/24/2016 Surgery   Operation: Robotic-assisted laparoscopic total hysterectomy with bilateral salpingoophorectomy, omentectomy, radical tumor debulking  Surgeon: Donaciano Eva  Operative Findings:  : small nodules in omentum, no ascites. Omental nodule (2cm) adherent to lower anterior abdominal wall. 2cm left colonic gutter cystic nodule. 6cm right external iliac lymph node. Grossly normal ovaries and tubes. Fibroid uterus. No gross residual disease at completion of surgery representing R0 optimal/complete resection.    04/17/2016 Imaging   CT abdomen and pelvis 1. There is no evidence of acute inflammatory process within abdomen or pelvis. 2. Stable low-density lesion within caudate lobe of the liver. No new hepatic lesions are noted. 3. Again noted lobulated renal contour and multifocal renal cortical scarring. No hydronephrosis or hydroureter. 4. No significant mesenteric adenopathy. No retroperitoneal adenopathy. Stable minimal residual peritoneal thickening within pelvis. No new pelvic implants or pelvic  ascites. 5. Status post hysterectomy.  Unremarkable urinary bladder. 6. Moderate stool within colon as described above. No evidence of colitis.    04/24/2016 - 06/19/2016 Chemotherapy   She received 3 more cycles of chemo after surgery   05/14/2016 Tumor Marker   Patient's tumor was tested for the following markers: CA-125 Results of the tumor marker test revealed 22.5   06/04/2016 Tumor Marker   Patient's tumor was tested for the following markers: CA-125 Results of the tumor marker test revealed 13.7   07/07/2016 Genetic Testing   Patient has genetic testing done for breast/ovasrian cancer panel Results revealed patient has no actionable mutation   07/23/2016 Imaging   Ct abdomen and pelvis 1. Heterogeneously enhancing 4.9 x 3.2 x 4.4 cm mass along the right pelvic sidewall may represent locally recurrent disease or malignant lymphadenopathy. 2. No other signs of definite metastatic disease noted elsewhere in the abdomen or pelvis. 3. Stable low-attenuation hepatic lesion in the caudate lobe of the liver, which appears to demonstrates some progressive centripetal filling on delayed images. This lesion is presumably benign given its stability compared to prior studies, and is favored to represent a small cavernous hemangioma. 4. Cardiomegaly with biatrial dilatation, which is very severe on the right side. 5. Aortic atherosclerosis. 6. Additional incidental findings, as above.   07/23/2016 Tumor Marker   Patient's tumor was tested for the following markers: CA-125 Results of the tumor marker test revealed 12.3   09/30/2016 Imaging   Ct abdomen and pelvis: 1. 4.9 cm heterogeneously enhancing mass seen along the right pelvic sidewall previously has almost completely resolved. There is some ill-defined soft tissue attenuation/peritoneal thickening in this region today but no discrete measurable lesion is evident. 2. No new or progressive findings on today's exam. 3. Stable hypo  attenuating lesion in the caudate lobe of the liver. 4. Subtle aortic atherosclerosis   09/30/2016 Tumor Marker   Patient's tumor was tested for the following  markers: CA-125 Results of the tumor marker test revealed 9.6   10/05/2016 Pathology Results   Vagina, biopsy, left cuff - BENIGN FIBROEPITHELIAL (STROMAL) POLYP. - NO DYSPLASIA, ATYPIA OR MALIGNANCY IDENTIFIED.   01/14/2017 Tumor Marker   Patient's tumor was tested for the following markers: CA-125 Results of the tumor marker test revealed 11.9   05/05/2017 Tumor Marker   Patient's tumor was tested for the following markers: CA-125 Results of the tumor marker test revealed 28   06/14/2017 Tumor Marker   Patient's tumor was tested for the following markers: CA-125 Results of the tumor marker test revealed 45.7   06/24/2017 Imaging   Ct abdomen and pelvis: Increased peritoneal metastatic disease in abdomen and pelvis since prior exam. No evidence of ascites. Stable small benign hepatic hemangioma.   07/21/2017 Tumor Marker   Patient's tumor was tested for the following markers: CA-125 Results of the tumor marker test revealed 84.1   07/21/2017 - 11/03/2017 Chemotherapy   She received carboplatin and taxol   08/11/2017 Adverse Reaction   She had severe neuropathy due to treatment. Does of chemo is reduced starting cycle 2 onwards   09/02/2017 Tumor Marker   Patient's tumor was tested for the following markers: CA-125 Results of the tumor marker test revealed 27.7   09/16/2017 Imaging   CT CHEST IMPRESSION  1. No acute process or evidence of metastatic disease in the chest. 2. Right upper lung bronchiectasis, consolidation, and architectural distortion are most likely post infectious or inflammatory and not significantly changed. 3. Aortic Atherosclerosis (ICD10-I70.0).  CT ABDOMEN AND PELVIS IMPRESSION  1. Response to therapy of nodal and peritoneal metastasis. 2. No new or progressive disease. 3. Caudate lobe  hemangioma, as before. 4. Bilateral renal cortical thinning. Similar minimal right-sided caliectasis and hydroureter. Likely secondary to low-grade obstruction by the dominant right pelvic implant.   09/22/2017 Tumor Marker   Patient's tumor was tested for the following markers: CA-125 Results of the tumor marker test revealed 18   11/03/2017 Tumor Marker   Patient's tumor was tested for the following markers: CA-125 Results of the tumor marker test revealed 13.4   11/25/2017 Imaging   1. Stable to slight interval decrease in size of nodal and peritoneal metastatic disease within the abdomen. 2. No new or progressive disease.   12/03/2017 - 09/11/2019 Chemotherapy   The patient is prescribed [place]   01/17/2018 Tumor Marker   Patient's tumor was tested for the following markers: CA-125 Results of the tumor marker test revealed 11.9   02/22/2018 Tumor Marker   Patient's tumor was tested for the following markers: CA-125 Results of the tumor marker test revealed 11.7   02/22/2018 Imaging   1. Improved nodular thickening along the vaginal cuff on the left and medial to the right iliac vessels, likely treated implants. No evidence of progressive peritoneal disease. 2. Stable hepatic hemangioma. No evidence of solid visceral organ metastases. 3. Bilateral renal cortical scarring.   05/19/2018 Tumor Marker   Patient's tumor was tested for the following markers: CA-125 Results of the tumor marker test revealed 9.9   08/10/2018 Imaging   Stable mild nodular thickening of the left vaginal cuff. No new or progressive disease within the abdomen or pelvis.  Stable small hepatic hemangioma.   09/21/2018 Tumor Marker   Patient's tumor was tested for the following markers: CA-125 Results of the tumor marker test revealed 10.3   11/11/2018 Tumor Marker   Patient's tumor was tested for the following markers: CA-125 Results of the  tumor marker test revealed 10.5   12/15/2018 Tumor Marker    Patient's tumor was tested for the following markers: CA-125 Results of the tumor marker test revealed 12.6   01/06/2019 Tumor Marker   Patient's tumor was tested for the following markers: CA-125 Results of the tumor marker test revealed 14.6   01/12/2019 Imaging   1. Chronic extensive right upper lobe scarring changes and bronchiectasis. 2. No mediastinal or hilar mass or adenopathy and no findings for pulmonary metastatic disease. 3. Stable caudate lobe hemangioma. 4. No worrisome omental or peritoneal surface lesions. 5. Stable pericecal and small pelvic lymph nodes. No new or progressive findings.    09/11/2019 Imaging   Ct abdomen and pelvis Pericecal peritoneal metastatic implants   09/19/2019 -  Chemotherapy   The patient had carboplatin and gemxar for chemotherapy treatment.       REVIEW OF SYSTEMS:   Constitutional: Denies fevers, chills or abnormal weight loss Eyes: Denies blurriness of vision Ears, nose, mouth, throat, and face: Denies mucositis or sore throat Respiratory: Denies cough, dyspnea or wheezes Cardiovascular: Denies palpitation, chest discomfort or lower extremity swelling Skin: Denies abnormal skin rashes Lymphatics: Denies new lymphadenopathy or easy bruising Neurological:Denies numbness, tingling or new weaknesses Behavioral/Psych: Mood is stable, no new changes  All other systems were reviewed with the patient and are negative.  I have reviewed the past medical history, past surgical history, social history and family history with the patient and they are unchanged from previous note.  ALLERGIES:  is allergic to codeine; lactulose; and latex.  MEDICATIONS:  Current Outpatient Medications  Medication Sig Dispense Refill  . apixaban (ELIQUIS) 5 MG TABS tablet Take 1 tablet (5 mg total) by mouth 2 (two) times daily. 60 tablet 5  . atorvastatin (LIPITOR) 40 MG tablet Take 40 mg by mouth at bedtime.    . diphenhydrAMINE (BENADRYL) 25 MG tablet Take 25  mg by mouth every 6 (six) hours as needed for allergies.    . fluticasone (FLONASE) 50 MCG/ACT nasal spray Place 2 sprays into both nostrils 2 (two) times daily.    Marland Kitchen gabapentin (NEURONTIN) 600 MG tablet Take 1 tablet (600 mg total) by mouth 2 (two) times daily. 60 tablet 11  . guaifenesin (ROBITUSSIN) 100 MG/5ML syrup Take 200 mg by mouth 3 (three) times daily as needed for cough.    . lidocaine-prilocaine (EMLA) cream Apply to Porta-Cath 1-2 hours prior to access as directed. (Patient not taking: Reported on 03/28/2019) 30 g 1  . metoprolol succinate (TOPROL-XL) 25 MG 24 hr tablet Take 25 mg by mouth daily.   5  . Multiple Vitamin (MULTIVITAMIN WITH MINERALS) TABS tablet Take 1 tablet by mouth daily.    Marland Kitchen omega-3 acid ethyl esters (LOVAZA) 1 g capsule Take 1 g by mouth daily.    . ondansetron (ZOFRAN ODT) 4 MG disintegrating tablet Take 1 tablet (4 mg total) by mouth every 8 (eight) hours as needed for nausea or vomiting. 60 tablet 9  . oxyCODONE (OXY IR/ROXICODONE) 5 MG immediate release tablet Take 1 tablet (5 mg total) by mouth every 6 (six) hours as needed for severe pain. 30 tablet 0  . PARoxetine (PAXIL) 40 MG tablet Take 40 mg by mouth daily.  0  . polyethylene glycol (MIRALAX / GLYCOLAX) packet Take 17 g by mouth daily.    . verapamil (CALAN-SR) 240 MG CR tablet Take 1 tablet (240 mg total) by mouth daily. 30 tablet 0   No current facility-administered medications for this  visit.     PHYSICAL EXAMINATION: ECOG PERFORMANCE STATUS: 2 - Symptomatic, <50% confined to bed GENERAL:alert, no distress and comfortable SKIN: skin color, texture, turgor are normal, no rashes or significant lesions EYES: normal, Conjunctiva are pink and non-injected, sclera clear OROPHARYNX:no exudate, no erythema and lips, buccal mucosa, and tongue normal  NECK: supple, thyroid normal size, non-tender, without nodularity LYMPH:  no palpable lymphadenopathy in the cervical, axillary or inguinal LUNGS: clear to  auscultation and percussion with normal breathing effort HEART: regular rate & rhythm and no murmurs and no lower extremity edema ABDOMEN:abdomen soft, non-tender and normal bowel sounds Musculoskeletal:no cyanosis of digits and no clubbing  NEURO: alert & oriented x 3 with expressive dysphagia, no focal motor/sensory deficits  LABORATORY DATA:  I have reviewed the data as listed    Component Value Date/Time   NA 143 10/17/2019 0900   NA 142 12/08/2017 1051   K 3.8 10/17/2019 0900   K 4.4 12/08/2017 1051   CL 107 10/17/2019 0900   CL 105 06/14/2017 1430   CO2 26 10/17/2019 0900   CO2 28 12/08/2017 1051   GLUCOSE 98 10/17/2019 0900   GLUCOSE 97 12/08/2017 1051   BUN 11 10/17/2019 0900   BUN 16.2 12/08/2017 1051   CREATININE 0.67 10/17/2019 0900   CREATININE 0.9 12/08/2017 1051   CALCIUM 8.8 (L) 10/17/2019 0900   CALCIUM 9.5 12/08/2017 1051   PROT 6.4 (L) 10/17/2019 0900   PROT 6.9 12/08/2017 1051   ALBUMIN 3.4 (L) 10/17/2019 0900   ALBUMIN 3.6 12/08/2017 1051   AST 23 10/17/2019 0900   AST 18 12/08/2017 1051   ALT 55 (H) 10/17/2019 0900   ALT 20 12/08/2017 1051   ALKPHOS 132 (H) 10/17/2019 0900   ALKPHOS 131 12/08/2017 1051   BILITOT 0.3 10/17/2019 0900   BILITOT 0.70 12/08/2017 1051   GFRNONAA >60 10/17/2019 0900   GFRAA >60 10/17/2019 0900    No results found for: SPEP, UPEP  Lab Results  Component Value Date   WBC 3.0 (L) 10/17/2019   NEUTROABS 1.3 (L) 10/17/2019   HGB 9.0 (L) 10/17/2019   HCT 26.3 (L) 10/17/2019   MCV 114.8 (H) 10/17/2019   PLT 305 10/17/2019      Chemistry      Component Value Date/Time   NA 143 10/17/2019 0900   NA 142 12/08/2017 1051   K 3.8 10/17/2019 0900   K 4.4 12/08/2017 1051   CL 107 10/17/2019 0900   CL 105 06/14/2017 1430   CO2 26 10/17/2019 0900   CO2 28 12/08/2017 1051   BUN 11 10/17/2019 0900   BUN 16.2 12/08/2017 1051   CREATININE 0.67 10/17/2019 0900   CREATININE 0.9 12/08/2017 1051      Component Value  Date/Time   CALCIUM 8.8 (L) 10/17/2019 0900   CALCIUM 9.5 12/08/2017 1051   ALKPHOS 132 (H) 10/17/2019 0900   ALKPHOS 131 12/08/2017 1051   AST 23 10/17/2019 0900   AST 18 12/08/2017 1051   ALT 55 (H) 10/17/2019 0900   ALT 20 12/08/2017 1051   BILITOT 0.3 10/17/2019 0900   BILITOT 0.70 12/08/2017 1051      All questions were answered. The patient knows to call the clinic with any problems, questions or concerns.   I spent 25 minutes counseling the patient face to face. The total time spent in the appointment was 30 minutes and more than 50% was on counseling and review of test results  Heath Lark, MD 10/17/2019 10:00 AM

## 2019-10-17 NOTE — Assessment & Plan Note (Signed)
She denies abdominal pain but her daughter stated that the patient has been complaining of abdominal pain She will continue oxycodone as prescribed

## 2019-10-17 NOTE — Patient Instructions (Signed)
[facility] Discharge Instructions for Patients Receiving Chemotherapy  Today you received the following chemotherapy agents: Gemcitabine (Gemzar)  To help prevent nausea and vomiting after your treatment, we encourage you to take your nausea medication as directed.    If you develop nausea and vomiting that is not controlled by your nausea medication, call the clinic.   BELOW ARE SYMPTOMS THAT SHOULD BE REPORTED IMMEDIATELY:  *FEVER GREATER THAN 100.5 F  *CHILLS WITH OR WITHOUT FEVER  NAUSEA AND VOMITING THAT IS NOT CONTROLLED WITH YOUR NAUSEA MEDICATION  *UNUSUAL SHORTNESS OF BREATH  *UNUSUAL BRUISING OR BLEEDING  TENDERNESS IN MOUTH AND THROAT WITH OR WITHOUT PRESENCE OF ULCERS  *URINARY PROBLEMS  *BOWEL PROBLEMS  UNUSUAL RASH Items with * indicate a potential emergency and should be followed up as soon as possible.  Feel free to call the clinic should you have any questions or concerns. The clinic phone number is [phone].  Please show the CHEMO ALERT CARD at check-in to the Emergency Department and triage nurse.   

## 2019-10-17 NOTE — Assessment & Plan Note (Signed)
She has expressive dysphagia from prior history of stroke I recommend her daughter to come with her for doctor's visit appointment in the future to facilitate accurate communication 

## 2019-10-17 NOTE — Assessment & Plan Note (Signed)
She have severe, persistent pancytopenia likely due to recent side effects of chemotherapy I plan to order serum vitamin B12 and iron studies for further evaluation

## 2019-10-17 NOTE — Assessment & Plan Note (Signed)
Overall, she tolerated treatment poorly with significant pancytopenia and GI issues Due to language barrier, I have to collaborate history with her daughter I plan to proceed with treatment today with similar dose adjustment as before but I plan to also reduce the dose of carboplatin for her next cycle of treatment After cycle 4, I plan to repeat imaging study for objective assessment of response to therapy

## 2019-10-18 ENCOUNTER — Telehealth: Payer: Self-pay | Admitting: *Deleted

## 2019-10-18 NOTE — Telephone Encounter (Signed)
Patient's daughter called to request pain medication refill, Oxycodone to [facility] on [address] please.

## 2019-10-19 ENCOUNTER — Other Ambulatory Visit: Payer: Self-pay | Admitting: Hematology and Oncology

## 2019-10-19 MED ORDER — OXYCODONE HCL 5 MG PO TABS: 5.0000 mg | ORAL_TABLET | Freq: Four times a day (QID) | ORAL | 0 refills | Status: DC | PRN

## 2019-10-19 NOTE — Telephone Encounter (Signed)
done

## 2019-10-31 ENCOUNTER — Inpatient Hospital Stay: Payer: Medicare Other

## 2019-10-31 ENCOUNTER — Other Ambulatory Visit: Payer: Self-pay

## 2019-10-31 VITALS — BP 114/69 | HR 50 | Temp 98.2°F | Resp 17 | Ht 70.0 in | Wt 202.4 lb

## 2019-10-31 DIAGNOSIS — C563 Malignant neoplasm of bilateral ovaries: Secondary | ICD-10-CM

## 2019-10-31 DIAGNOSIS — Z7189 Other specified counseling: Secondary | ICD-10-CM

## 2019-10-31 DIAGNOSIS — C562 Malignant neoplasm of left ovary: Secondary | ICD-10-CM

## 2019-10-31 DIAGNOSIS — C561 Malignant neoplasm of right ovary: Secondary | ICD-10-CM

## 2019-10-31 DIAGNOSIS — C800 Disseminated malignant neoplasm, unspecified: Secondary | ICD-10-CM

## 2019-10-31 DIAGNOSIS — Z5111 Encounter for antineoplastic chemotherapy: Secondary | ICD-10-CM | POA: Diagnosis not present

## 2019-10-31 DIAGNOSIS — Z95828 Presence of other vascular implants and grafts: Secondary | ICD-10-CM

## 2019-10-31 LAB — CBC WITH DIFFERENTIAL (CANCER CENTER ONLY)
Abs Immature Granulocytes: 0.01 10*3/uL (ref 0.00–0.07)
Basophils Absolute: 0 10*3/uL (ref 0.0–0.1)
Basophils Relative: 0 %
Eosinophils Absolute: 0 10*3/uL (ref 0.0–0.5)
Eosinophils Relative: 1 %
HCT: 27.5 % — ABNORMAL LOW (ref 36.0–46.0)
Hemoglobin: 9.2 g/dL — ABNORMAL LOW (ref 12.0–15.0)
Immature Granulocytes: 0 %
Lymphocytes Relative: 21 %
Lymphs Abs: 1.1 10*3/uL (ref 0.7–4.0)
MCH: 40 pg — ABNORMAL HIGH (ref 26.0–34.0)
MCHC: 33.5 g/dL (ref 30.0–36.0)
MCV: 119.6 fL — ABNORMAL HIGH (ref 80.0–100.0)
Monocytes Absolute: 0.7 10*3/uL (ref 0.1–1.0)
Monocytes Relative: 13 %
Neutro Abs: 3.2 10*3/uL (ref 1.7–7.7)
Neutrophils Relative %: 65 %
Platelet Count: 284 10*3/uL (ref 150–400)
RBC: 2.3 MIL/uL — ABNORMAL LOW (ref 3.87–5.11)
RDW: 17 % — ABNORMAL HIGH (ref 11.5–15.5)
WBC Count: 5 10*3/uL (ref 4.0–10.5)
nRBC: 0.8 % — ABNORMAL HIGH (ref 0.0–0.2)

## 2019-10-31 LAB — CMP (CANCER CENTER ONLY)
ALT: 25 U/L (ref 0–44)
AST: 19 U/L (ref 15–41)
Albumin: 3.4 g/dL — ABNORMAL LOW (ref 3.5–5.0)
Alkaline Phosphatase: 123 U/L (ref 38–126)
Anion gap: 8 (ref 5–15)
BUN: 14 mg/dL (ref 8–23)
CO2: 28 mmol/L (ref 22–32)
Calcium: 8.9 mg/dL (ref 8.9–10.3)
Chloride: 106 mmol/L (ref 98–111)
Creatinine: 0.77 mg/dL (ref 0.44–1.00)
GFR, Est AFR Am: >60 mL/min (ref >60)
GFR, Estimated: >60 mL/min (ref >60)
Glucose, Bld: 111 mg/dL — ABNORMAL HIGH (ref 70–99)
Potassium: 3.9 mmol/L (ref 3.5–5.1)
Sodium: 142 mmol/L (ref 135–145)
Total Bilirubin: 0.4 mg/dL (ref 0.3–1.2)
Total Protein: 6.5 g/dL (ref 6.5–8.1)

## 2019-10-31 MED ORDER — SODIUM CHLORIDE 0.9% FLUSH
10.0000 mL | INTRAVENOUS | Status: DC | PRN
  Administered 2019-10-31: 10 mL
  Filled 2019-10-31: qty 10

## 2019-10-31 MED ORDER — SODIUM CHLORIDE 0.9 % IV SOLN
600.0000 mg/m2 | Freq: Once | INTRAVENOUS | Status: AC
  Administered 2019-10-31: 1254 mg via INTRAVENOUS
  Filled 2019-10-31: qty 32.98

## 2019-10-31 MED ORDER — SODIUM CHLORIDE 0.9 % IV SOLN
318.4000 mg | Freq: Once | INTRAVENOUS | Status: AC
  Administered 2019-10-31: 320 mg via INTRAVENOUS
  Filled 2019-10-31: qty 32

## 2019-10-31 MED ORDER — SODIUM CHLORIDE 0.9 % IV SOLN
Freq: Once | INTRAVENOUS | Status: AC
  Administered 2019-10-31: 11:00:00 via INTRAVENOUS
  Filled 2019-10-31: qty 250

## 2019-10-31 MED ORDER — PALONOSETRON HCL INJECTION 0.25 MG/5ML
0.2500 mg | Freq: Once | INTRAVENOUS | Status: AC
  Administered 2019-10-31: 0.25 mg via INTRAVENOUS

## 2019-10-31 MED ORDER — HEPARIN SOD (PORK) LOCK FLUSH 100 UNIT/ML IV SOLN
500.0000 [IU] | Freq: Once | INTRAVENOUS | Status: AC | PRN
  Administered 2019-10-31: 500 [IU]
  Filled 2019-10-31: qty 5

## 2019-10-31 MED ORDER — PALONOSETRON HCL INJECTION 0.25 MG/5ML
INTRAVENOUS | Status: AC
  Filled 2019-10-31: qty 5

## 2019-10-31 MED ORDER — SODIUM CHLORIDE 0.9% FLUSH
10.0000 mL | Freq: Once | INTRAVENOUS | Status: AC
  Administered 2019-10-31: 10 mL
  Filled 2019-10-31: qty 10

## 2019-10-31 MED ORDER — SODIUM CHLORIDE 0.9 % IV SOLN
Freq: Once | INTRAVENOUS | Status: AC
  Administered 2019-10-31: 11:00:00 via INTRAVENOUS
  Filled 2019-10-31: qty 5

## 2019-10-31 NOTE — Patient Instructions (Signed)
[facility] Discharge Instructions for Patients Receiving Chemotherapy  Today you received the following chemotherapy agents: Gemcitabine (Gemzar) and Carboplatin (Paraplatin)  To help prevent nausea and vomiting after your treatment, we encourage you to take your nausea medication as directed.   If you develop nausea and vomiting that is not controlled by your nausea medication, call the clinic.   BELOW ARE SYMPTOMS THAT SHOULD BE REPORTED IMMEDIATELY:  *FEVER GREATER THAN 100.5 F  *CHILLS WITH OR WITHOUT FEVER  NAUSEA AND VOMITING THAT IS NOT CONTROLLED WITH YOUR NAUSEA MEDICATION  *UNUSUAL SHORTNESS OF BREATH  *UNUSUAL BRUISING OR BLEEDING  TENDERNESS IN MOUTH AND THROAT WITH OR WITHOUT PRESENCE OF ULCERS  *URINARY PROBLEMS  *BOWEL PROBLEMS  UNUSUAL RASH Items with * indicate a potential emergency and should be followed up as soon as possible.  Feel free to call the clinic should you have any questions or concerns. The clinic phone number is [phone].  Please show the [place] at check-in to the Emergency Department and triage nurse.  Coronavirus (COVID-19) Are you at risk?  Are you at risk for the Coronavirus (COVID-19)?  To be considered HIGH RISK for Coronavirus (COVID-19), you have to meet the following criteria:  . Traveled to [place], [place], [place], [place] or [place]; or in the [place] to [place], [place], [place], or [place]; and have fever, cough, and shortness of breath within the last 2 weeks of travel OR . Been in close contact with a person diagnosed with COVID-19 within the last 2 weeks and have fever, cough, and shortness of breath . IF YOU DO NOT MEET THESE CRITERIA, YOU ARE CONSIDERED LOW RISK FOR COVID-19.  What to do if you are HIGH RISK for COVID-19?  Marland Kitchen If you are having a medical emergency, call 911. . Seek medical care right away. Before you go to a doctor's office, urgent care or emergency department,  call ahead and tell them about your recent travel, contact with someone diagnosed with COVID-19, and your symptoms. You should receive instructions from your physician's office regarding next steps of care.  . When you arrive at healthcare provider, tell the healthcare staff immediately you have returned from visiting [place], [place], [place], [place] or [place]; or traveled in the [place] to [place], [place], [place], or [place]; in the last two weeks or you have been in close contact with a person diagnosed with COVID-19 in the last 2 weeks.   . Tell the health care staff about your symptoms: fever, cough and shortness of breath. . After you have been seen by a medical provider, you will be either: o Tested for (COVID-19) and discharged home on quarantine except to seek medical care if symptoms worsen, and asked to  - Stay home and avoid contact with others until you get your results (4-5 days)  - Avoid travel on public transportation if possible (such as bus, train, or airplane) or o Sent to the Emergency Department by EMS for evaluation, COVID-19 testing, and possible admission depending on your condition and test results.  What to do if you are LOW RISK for COVID-19?  Reduce your risk of any infection by using the same precautions used for avoiding the common cold or flu:  Marland Kitchen Wash your hands often with soap and warm water for at least 20 seconds.  If soap and water are not readily available, use an alcohol-based hand sanitizer with at least 60% alcohol.  Marland Kitchen  If coughing or sneezing, cover your mouth and nose by coughing or sneezing into the elbow areas of your shirt or coat, into a tissue or into your sleeve (not your hands). . Avoid shaking hands with others and consider head nods or verbal greetings only. . Avoid touching your eyes, nose, or mouth with unwashed hands.  . Avoid close contact with people who are sick. . Avoid places or events with large numbers of people in one  location, like concerts or sporting events. . Carefully consider travel plans you have or are making. . If you are planning any travel outside or inside the [place], visit the CDC's Travelers' Health webpage for the latest health notices. . If you have some symptoms but not all symptoms, continue to monitor at home and seek medical attention if your symptoms worsen. . If you are having a medical emergency, call 911.   ADDITIONAL HEALTHCARE OPTIONS FOR PATIENTS  [facility] / e-Visit: [email or web address]         [facility]: [phone]  [facility]: [phone]                   [facility]: [phone]   

## 2019-11-07 ENCOUNTER — Inpatient Hospital Stay: Payer: Medicare Other

## 2019-11-07 ENCOUNTER — Inpatient Hospital Stay (HOSPITAL_BASED_OUTPATIENT_CLINIC_OR_DEPARTMENT_OTHER): Payer: Medicare Other | Admitting: Hematology and Oncology

## 2019-11-07 ENCOUNTER — Inpatient Hospital Stay: Payer: Medicare Other | Attending: Hematology and Oncology

## 2019-11-07 ENCOUNTER — Other Ambulatory Visit: Payer: Self-pay

## 2019-11-07 ENCOUNTER — Encounter: Payer: Self-pay | Admitting: Hematology and Oncology

## 2019-11-07 VITALS — BP 131/71 | HR 69 | Temp 98.0°F | Resp 18 | Ht 70.0 in | Wt 204.8 lb

## 2019-11-07 DIAGNOSIS — K5900 Constipation, unspecified: Secondary | ICD-10-CM | POA: Diagnosis not present

## 2019-11-07 DIAGNOSIS — C562 Malignant neoplasm of left ovary: Secondary | ICD-10-CM

## 2019-11-07 DIAGNOSIS — T451X5A Adverse effect of antineoplastic and immunosuppressive drugs, initial encounter: Secondary | ICD-10-CM | POA: Diagnosis not present

## 2019-11-07 DIAGNOSIS — C561 Malignant neoplasm of right ovary: Secondary | ICD-10-CM | POA: Diagnosis not present

## 2019-11-07 DIAGNOSIS — C563 Malignant neoplasm of bilateral ovaries: Secondary | ICD-10-CM

## 2019-11-07 DIAGNOSIS — Z7189 Other specified counseling: Secondary | ICD-10-CM

## 2019-11-07 DIAGNOSIS — R1310 Dysphagia, unspecified: Secondary | ICD-10-CM | POA: Insufficient documentation

## 2019-11-07 DIAGNOSIS — Z5111 Encounter for antineoplastic chemotherapy: Secondary | ICD-10-CM | POA: Insufficient documentation

## 2019-11-07 DIAGNOSIS — R519 Headache, unspecified: Secondary | ICD-10-CM

## 2019-11-07 DIAGNOSIS — Z95828 Presence of other vascular implants and grafts: Secondary | ICD-10-CM

## 2019-11-07 DIAGNOSIS — Z79899 Other long term (current) drug therapy: Secondary | ICD-10-CM | POA: Diagnosis not present

## 2019-11-07 DIAGNOSIS — Z8673 Personal history of transient ischemic attack (TIA), and cerebral infarction without residual deficits: Secondary | ICD-10-CM | POA: Insufficient documentation

## 2019-11-07 DIAGNOSIS — C786 Secondary malignant neoplasm of retroperitoneum and peritoneum: Secondary | ICD-10-CM | POA: Insufficient documentation

## 2019-11-07 DIAGNOSIS — Z7901 Long term (current) use of anticoagulants: Secondary | ICD-10-CM | POA: Insufficient documentation

## 2019-11-07 DIAGNOSIS — D61818 Other pancytopenia: Secondary | ICD-10-CM

## 2019-11-07 DIAGNOSIS — C800 Disseminated malignant neoplasm, unspecified: Secondary | ICD-10-CM

## 2019-11-07 DIAGNOSIS — R4702 Dysphasia: Secondary | ICD-10-CM | POA: Diagnosis not present

## 2019-11-07 LAB — CMP (CANCER CENTER ONLY)
ALT: 33 U/L (ref 0–44)
AST: 19 U/L (ref 15–41)
Albumin: 3.5 g/dL (ref 3.5–5.0)
Alkaline Phosphatase: 103 U/L (ref 38–126)
Anion gap: 8 (ref 5–15)
BUN: 8 mg/dL (ref 8–23)
CO2: 28 mmol/L (ref 22–32)
Calcium: 9.2 mg/dL (ref 8.9–10.3)
Chloride: 106 mmol/L (ref 98–111)
Creatinine: 0.71 mg/dL (ref 0.44–1.00)
GFR, Est AFR Am: >60 mL/min (ref >60)
GFR, Estimated: >60 mL/min (ref >60)
Glucose, Bld: 102 mg/dL — ABNORMAL HIGH (ref 70–99)
Potassium: 3.7 mmol/L (ref 3.5–5.1)
Sodium: 142 mmol/L (ref 135–145)
Total Bilirubin: 0.4 mg/dL (ref 0.3–1.2)
Total Protein: 6.6 g/dL (ref 6.5–8.1)

## 2019-11-07 LAB — CBC WITH DIFFERENTIAL (CANCER CENTER ONLY)
Abs Immature Granulocytes: 0.02 10*3/uL (ref 0.00–0.07)
Basophils Absolute: 0 10*3/uL (ref 0.0–0.1)
Basophils Relative: 0 %
Eosinophils Absolute: 0 10*3/uL (ref 0.0–0.5)
Eosinophils Relative: 1 %
HCT: 28.1 % — ABNORMAL LOW (ref 36.0–46.0)
Hemoglobin: 9.3 g/dL — ABNORMAL LOW (ref 12.0–15.0)
Immature Granulocytes: 1 %
Lymphocytes Relative: 45 %
Lymphs Abs: 1.3 10*3/uL (ref 0.7–4.0)
MCH: 39.6 pg — ABNORMAL HIGH (ref 26.0–34.0)
MCHC: 33.1 g/dL (ref 30.0–36.0)
MCV: 119.6 fL — ABNORMAL HIGH (ref 80.0–100.0)
Monocytes Absolute: 0.3 10*3/uL (ref 0.1–1.0)
Monocytes Relative: 10 %
Neutro Abs: 1.3 10*3/uL — ABNORMAL LOW (ref 1.7–7.7)
Neutrophils Relative %: 43 %
Platelet Count: 353 10*3/uL (ref 150–400)
RBC: 2.35 MIL/uL — ABNORMAL LOW (ref 3.87–5.11)
RDW: 16.6 % — ABNORMAL HIGH (ref 11.5–15.5)
WBC Count: 2.9 10*3/uL — ABNORMAL LOW (ref 4.0–10.5)
nRBC: 2 % — ABNORMAL HIGH (ref 0.0–0.2)

## 2019-11-07 MED ORDER — SODIUM CHLORIDE 0.9 % IV SOLN
Freq: Once | INTRAVENOUS | Status: AC
  Administered 2019-11-07: 12:00:00 via INTRAVENOUS
  Filled 2019-11-07: qty 250

## 2019-11-07 MED ORDER — ACETAMINOPHEN 325 MG PO TABS
ORAL_TABLET | ORAL | Status: AC
  Filled 2019-11-07: qty 2

## 2019-11-07 MED ORDER — PROCHLORPERAZINE MALEATE 10 MG PO TABS
ORAL_TABLET | ORAL | Status: AC
  Filled 2019-11-07: qty 1

## 2019-11-07 MED ORDER — ACETAMINOPHEN 325 MG PO TABS
650.0000 mg | ORAL_TABLET | Freq: Once | ORAL | Status: AC
  Administered 2019-11-07: 650 mg via ORAL

## 2019-11-07 MED ORDER — SODIUM CHLORIDE 0.9 % IV SOLN
600.0000 mg/m2 | Freq: Once | INTRAVENOUS | Status: AC
  Administered 2019-11-07: 1254 mg via INTRAVENOUS
  Filled 2019-11-07: qty 32.98

## 2019-11-07 MED ORDER — SODIUM CHLORIDE 0.9% FLUSH
10.0000 mL | Freq: Once | INTRAVENOUS | Status: AC
  Administered 2019-11-07: 10 mL
  Filled 2019-11-07: qty 10

## 2019-11-07 MED ORDER — PROCHLORPERAZINE MALEATE 10 MG PO TABS
10.0000 mg | ORAL_TABLET | Freq: Once | ORAL | Status: AC
  Administered 2019-11-07: 10 mg via ORAL

## 2019-11-07 MED ORDER — SODIUM CHLORIDE 0.9% FLUSH
10.0000 mL | INTRAVENOUS | Status: DC | PRN
  Administered 2019-11-07: 10 mL
  Filled 2019-11-07: qty 10

## 2019-11-07 MED ORDER — HEPARIN SOD (PORK) LOCK FLUSH 100 UNIT/ML IV SOLN
500.0000 [IU] | Freq: Once | INTRAVENOUS | Status: AC | PRN
  Administered 2019-11-07: 500 [IU]
  Filled 2019-11-07: qty 5

## 2019-11-07 NOTE — Patient Instructions (Signed)
[facility] Discharge Instructions for Patients Receiving Chemotherapy  Today you received the following chemotherapy agents: Gemcitabine (Gemzar)  To help prevent nausea and vomiting after your treatment, we encourage you to take your nausea medication as directed.   If you develop nausea and vomiting that is not controlled by your nausea medication, call the clinic.   BELOW ARE SYMPTOMS THAT SHOULD BE REPORTED IMMEDIATELY:  *FEVER GREATER THAN 100.5 F  *CHILLS WITH OR WITHOUT FEVER  NAUSEA AND VOMITING THAT IS NOT CONTROLLED WITH YOUR NAUSEA MEDICATION  *UNUSUAL SHORTNESS OF BREATH  *UNUSUAL BRUISING OR BLEEDING  TENDERNESS IN MOUTH AND THROAT WITH OR WITHOUT PRESENCE OF ULCERS  *URINARY PROBLEMS  *BOWEL PROBLEMS  UNUSUAL RASH Items with * indicate a potential emergency and should be followed up as soon as possible.  Feel free to call the clinic should you have any questions or concerns. The clinic phone number is [phone].  Please show the CHEMO ALERT CARD at check-in to the Emergency Department and triage nurse.  Coronavirus (COVID-19) Are you at risk?  Are you at risk for the Coronavirus (COVID-19)?  To be considered HIGH RISK for Coronavirus (COVID-19), you have to meet the following criteria:  . Traveled to [place], [place], [place], [place] or [place]; or in the [place] to [place], [place], [place], or [place]; and have fever, cough, and shortness of breath within the last 2 weeks of travel OR . Been in close contact with a person diagnosed with COVID-19 within the last 2 weeks and have fever, cough, and shortness of breath . IF YOU DO NOT MEET THESE CRITERIA, YOU ARE CONSIDERED LOW RISK FOR COVID-19.  What to do if you are HIGH RISK for COVID-19?  . If you are having a medical emergency, call 911. . Seek medical care right away. Before you go to a doctor's office, urgent care or emergency department, call ahead and tell them  about your recent travel, contact with someone diagnosed with COVID-19, and your symptoms. You should receive instructions from your physician's office regarding next steps of care.  . When you arrive at healthcare provider, tell the healthcare staff immediately you have returned from visiting [place], [place], [place], [place] or [place]; or traveled in the [place] to [place], [place], [place], or [place]; in the last two weeks or you have been in close contact with a person diagnosed with COVID-19 in the last 2 weeks.   . Tell the health care staff about your symptoms: fever, cough and shortness of breath. . After you have been seen by a medical provider, you will be either: o Tested for (COVID-19) and discharged home on quarantine except to seek medical care if symptoms worsen, and asked to  - Stay home and avoid contact with others until you get your results (4-5 days)  - Avoid travel on public transportation if possible (such as bus, train, or airplane) or o Sent to the Emergency Department by EMS for evaluation, COVID-19 testing, and possible admission depending on your condition and test results.  What to do if you are LOW RISK for COVID-19?  Reduce your risk of any infection by using the same precautions used for avoiding the common cold or flu:  . Wash your hands often with soap and warm water for at least 20 seconds.  If soap and water are not readily available, use an alcohol-based hand sanitizer with at least 60% alcohol.  . If coughing or   sneezing, cover your mouth and nose by coughing or sneezing into the elbow areas of your shirt or coat, into a tissue or into your sleeve (not your hands). . Avoid shaking hands with others and consider head nods or verbal greetings only. . Avoid touching your eyes, nose, or mouth with unwashed hands.  . Avoid close contact with people who are sick. . Avoid places or events with large numbers of people in one location, like concerts or  sporting events. . Carefully consider travel plans you have or are making. . If you are planning any travel outside or inside the [place], visit the CDC's Travelers' Health webpage for the latest health notices. . If you have some symptoms but not all symptoms, continue to monitor at home and seek medical attention if your symptoms worsen. . If you are having a medical emergency, call 911.   ADDITIONAL HEALTHCARE OPTIONS FOR PATIENTS  [facility] / e-Visit: [email or web address]         [facility]: [phone]  [facility]: [phone]                   [facility]: [phone]   

## 2019-11-07 NOTE — Assessment & Plan Note (Signed)
Overall, she tolerated treatment poorly with significant pancytopenia Due to language barrier, I have to collaborate history with her daughter I plan to proceed with treatment today with similar dose adjustment  I plan to repeat imaging study next month for objective assessment of response to therapy If she have good response to treatment, I plan to adjust her chemotherapy schedule to treatment every other week She agreed with the plan of care

## 2019-11-07 NOTE — Progress Notes (Signed)
[place] OFFICE PROGRESS NOTE  Patient Care Team: Helane Rima, MD as PCP - General (Family Medicine) Encarnacion Slates, MD as Referring Physician (Neurology) Cameron Sprang, MD as Consulting Physician (Neurology)  ASSESSMENT & PLAN:  Ovarian cancer [facility]) Overall, she tolerated treatment poorly with significant pancytopenia Due to language barrier, I have to collaborate history with her daughter I plan to proceed with treatment today with similar dose adjustment  I plan to repeat imaging study next month for objective assessment of response to therapy If she have good response to treatment, I plan to adjust her chemotherapy schedule to treatment every other week She agreed with the plan of care  Pancytopenia, acquired [facility]) She have severe, persistent pancytopenia likely due to recent side effects of chemotherapy Her recent B12 and iron studies are adequate Her pancytopenia is due to treatment She is not symptomatic She does not need transfusion support  Expressive dysphasia She has expressive dysphagia from prior history of stroke I recommend her daughter to come with her for doctor's visit appointment in the future to facilitate accurate communication   Orders Placed This Encounter  Procedures  . CT Abdomen Pelvis W Contrast    Standing Status:   Future    Standing Expiration Date:   11/06/2020    Order Specific Question:   If indicated for the ordered procedure, I authorize the administration of contrast media per Radiology protocol    Answer:   Yes    Order Specific Question:   Preferred imaging location?    Answer:   [facility]    Order Specific Question:   Radiology Contrast Protocol - do NOT remove file path    Answer:   [email or web address]    INTERVAL HISTORY: Please see below for problem oriented charting. She returns with her daughter for further follow-up She has mild intermittent abdominal discomfort that comes  and goes Denies nausea or constipation No recent bleeding The patient continues to struggle with expressive dysphagia  SUMMARY OF ONCOLOGIC HISTORY: Oncology History Overview Note  Negative genetic testing Progressed on Niraparib   Carcinomatosis ([place])  12/06/2015 Initial Diagnosis   Carcinomatosis ([place])   Ovarian cancer, bilateral ([place])  12/24/2015 Initial Diagnosis   Ovarian cancer, bilateral ([place])   Ovarian cancer ([place])  11/28/2015 Imaging   Ct scan of abdomen: Peritoneal carcinomatosis and pelvic/inguinal adenopathy. Gynecologic primary is favored.   12/17/2015 Pathology Results   Lymph node, needle/core biopsy, L inguinal LAN - METASTATIC ADENOCARCINOMA. Microscopic Comment Immunohistochemistry will be performed and reported as an addendum. (JDP:ecj 12/18/2015) ADDENDUM: Immunohistochemistry shows strong positivity with cytokeratin 7, estrogen receptor (ER), progesterone receptor (PR), p53 and WT1. Negative markers are cytokeratin 20 , CDX- 2 and gross cystic disease fluid protein. 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 (JDP:kh 12-19-15   12/17/2015 Procedure   Technically successful ultrasound guided core left inguinal adenopathy biopsy   12/24/2015 Tumor Marker   Patient's tumor was tested for the following markers: CA-125 Results of the tumor marker test revealed 608.2   12/27/2015 - 02/28/2016 Chemotherapy   She received neoadjuvant chemo x 3 cycles   01/01/2016 Tumor Marker   Patient's tumor was tested for the following markers: CA-125 Results of the tumor marker test revealed 741.6   01/29/2016 Procedure   Successful placement of a right internal jugular approach power injectable Port-A-Cath. The catheter is ready for immediate use   01/29/2016 Imaging   US abdomen  1. Hyperechoic 2.5 x 1.0 x 1.6 cm lesion in the region the caudate lobe of the liver. Similar finding  noted on prior CT of 11/28/2015. This could represent hemangioma. This could represent a malignancy including metastatic disease. 2. Exam otherwise unremarkable. No gallstones. No biliary distention.   02/03/2016 Tumor Marker   Patient's tumor was tested for the following markers: CA-125 Results of the tumor marker test revealed 137.1   02/20/2016 Imaging   MRI brain No acute infarct.  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  No intracranial mass.  Moderate small vessel disease changes.  Global atrophy without hydrocephalus.  Expanded partially empty sella without secondary findings of pseudotumor cerebri.  Minimal mucosal thickening ethmoid sinus air cells. Small air-fluid levels maxillary sinuses bilaterally may indicate changes of acute sinusitis.    02/28/2016 Tumor Marker   Patient's tumor was tested for the following markers: CA-125 Results of the tumor marker test revealed 33.6   03/02/2016 Imaging   CT chest, abdomen and pelvis 1. Today's study demonstrates a positive response to therapy with resolution of the previously noted malignant ascites, significant regression of previously noted peritoneal implants, and regression of previously noted lymphadenopathy in the abdomen and pelvis. 2. No definite evidence to suggest metastatic disease to the thorax. 3. Stable 1.0 x 1.7 cm intermediate attenuation lesion associated with the posterior aspect of segment 1 of the liver, favored to represent a mildly proteinaceous hepatic cyst. The possibility of a peritoneal implant in this region is not entirely excluded, but is not favored on today's examination. 4. Extensive post infectious scarring inguinal upper right lung, likely sequela of prior necrotizing pneumonia. 5. Multiple tiny pulmonary nodules scattered throughout the lungs bilaterally all measuring 4 mm or less. These are nonspecific, but favored to be benign. Given the patient's history of primary  gynecologic malignancy, attention on followup studies is recommended to ensure the stability of these findings. 6. Additional incidental findings, as above   03/14/2016 Imaging   CT angiogram 1. No pulmonary emboli. 2. Chronic changes to the right upper lobe. 3. Stable lesion in the caudate lobe of the liver. 4. Stable soft tissue prominence to the right of the trachea as described above. 5. Stable small nodule in the right lung.    03/24/2016 Pathology Results   1. Omentum, resection for tumor - FOCAL ADENOCARCINOMA ASSOCIATED WITH EXTENSIVE FIBROSIS, INFLAMMATION AND HEMOSIDERIN DEPOSITION. 2. Uterus +/- tubes/ovaries, neoplastic, cervix - CERVIX: SLIGHT CERVICITIS AND SQUAMOUS METAPLASIA. - ENDOMETRIUM: ATROPHIC, NO HYPERPLASIA OR MALIGNANCY. - MYOMETRIUM: LEIOMYOMATA WITH DEGENERATIVE CHANGES. NO MALIGNANCY. - UTERINE SEROSA: FOCAL ADENOCARCINOMA ASSOCIATED WITH ADHESIONS. - RIGHT OVARY: BENIGN CALCIFIED NODULE AND BENIGN SEROUS CYST. NO MALIGNANCY IDENTIFIED. - RIGHT FALLOPIAN TUBE: UNREMARKABLE. NO MALIGNANCY IDENTIFIED. - LEFT OVARY: FOCAL ADENOCARCINOMA ASSOCIATED WITH EXTENSIVE FIBROSIS. - LEFT FALLOPIAN TUBE: HYDROSALPINX. NO MALIGNANCY IDENTIFIED. 3. Lymph node, biopsy, right external iliac - METASTATIC ADENOCARCINOMA, 4. Soft tissue, biopsy, left para colic gutter - FOCAL ADENOCARCINOMA ASSOCIATED WITH FIBROSIS. Microscopic Comment 2. OVARY Specimen(s): Uterus with bilateral ovaries, omentum, right external iliac lymph node and left paracolic gutter. Procedure: (including lymph node sampling) Hysterectomy with bilateral salpingo-oophorectomy, omentectomy, lymph node biopsy and paracolic gutter biopsy. Primary tumor site (including laterality): Left ovary. Ovarian surface involvement: Yes Ovarian capsule intact without fragmentation: N/A Maximum tumor size (cm): 2 cm, 2 cm Histologic type: Serous carcinoma. Grade: 3 Peritoneal implants: (specify invasive or  non-invasive): Left paracolic gutter positive for carcinoma Pelvic extension (list additional structures on separate lines and if involved):  Omentum, right paracolic gutter and uterine serosa. Lymph nodes: number examined 1 ; number positive 1 TNM code: ypT3a, ypN1b, ypMX FIGO Stage (based on pathologic findings, needs clinical correlation): IIIA2 Comments: The left ovary has a 2 cm nodule, which is composed primarily of fibrous tissue with small foci of residual adenocarcinoma. There is also microscopic involvement of the uterine serosa, the left paracolic gutter biopsy and the omentum. The left paracolic gutter and omental involvement is microscopic and both are associated with extensive fibrosis with focal hemosiderin deposition. The right external iliac node is completely replaced by metastatic adenocarcinoma with serous features. (JDP:ecj 03/30/2016)   03/24/2016 Surgery   Operation: Robotic-assisted laparoscopic total hysterectomy with bilateral salpingoophorectomy, omentectomy, radical tumor debulking  Surgeon: Donaciano Eva  Operative Findings:  : small nodules in omentum, no ascites. Omental nodule (2cm) adherent to lower anterior abdominal wall. 2cm left colonic gutter cystic nodule. 6cm right external iliac lymph node. Grossly normal ovaries and tubes. Fibroid uterus. No gross residual disease at completion of surgery representing R0 optimal/complete resection.    04/17/2016 Imaging   CT abdomen and pelvis 1. There is no evidence of acute inflammatory process within abdomen or pelvis. 2. Stable low-density lesion within caudate lobe of the liver. No new hepatic lesions are noted. 3. Again noted lobulated renal contour and multifocal renal cortical scarring. No hydronephrosis or hydroureter. 4. No significant mesenteric adenopathy. No retroperitoneal adenopathy. Stable minimal residual peritoneal thickening within pelvis. No new pelvic implants or pelvic ascites. 5. Status  post hysterectomy.  Unremarkable urinary bladder. 6. Moderate stool within colon as described above. No evidence of colitis.    04/24/2016 - 06/19/2016 Chemotherapy   She received 3 more cycles of chemo after surgery   05/14/2016 Tumor Marker   Patient's tumor was tested for the following markers: CA-125 Results of the tumor marker test revealed 22.5   06/04/2016 Tumor Marker   Patient's tumor was tested for the following markers: CA-125 Results of the tumor marker test revealed 13.7   07/07/2016 Genetic Testing   Patient has genetic testing done for breast/ovasrian cancer panel Results revealed patient has no actionable mutation   07/23/2016 Imaging   Ct abdomen and pelvis 1. Heterogeneously enhancing 4.9 x 3.2 x 4.4 cm mass along the right pelvic sidewall may represent locally recurrent disease or malignant lymphadenopathy. 2. No other signs of definite metastatic disease noted elsewhere in the abdomen or pelvis. 3. Stable low-attenuation hepatic lesion in the caudate lobe of the liver, which appears to demonstrates some progressive centripetal filling on delayed images. This lesion is presumably benign given its stability compared to prior studies, and is favored to represent a small cavernous hemangioma. 4. Cardiomegaly with biatrial dilatation, which is very severe on the right side. 5. Aortic atherosclerosis. 6. Additional incidental findings, as above.   07/23/2016 Tumor Marker   Patient's tumor was tested for the following markers: CA-125 Results of the tumor marker test revealed 12.3   09/30/2016 Imaging   Ct abdomen and pelvis: 1. 4.9 cm heterogeneously enhancing mass seen along the right pelvic sidewall previously has almost completely resolved. There is some ill-defined soft tissue attenuation/peritoneal thickening in this region today but no discrete measurable lesion is evident. 2. No new or progressive findings on today's exam. 3. Stable hypo attenuating lesion in the  caudate lobe of the liver. 4. Subtle aortic atherosclerosis   09/30/2016 Tumor Marker   Patient's tumor was tested for the following markers: CA-125 Results of the tumor marker test revealed  9.6   10/05/2016 Pathology Results   Vagina, biopsy, left cuff - BENIGN FIBROEPITHELIAL (STROMAL) POLYP. - NO DYSPLASIA, ATYPIA OR MALIGNANCY IDENTIFIED.   01/14/2017 Tumor Marker   Patient's tumor was tested for the following markers: CA-125 Results of the tumor marker test revealed 11.9   05/05/2017 Tumor Marker   Patient's tumor was tested for the following markers: CA-125 Results of the tumor marker test revealed 28   06/14/2017 Tumor Marker   Patient's tumor was tested for the following markers: CA-125 Results of the tumor marker test revealed 45.7   06/24/2017 Imaging   Ct abdomen and pelvis: Increased peritoneal metastatic disease in abdomen and pelvis since prior exam. No evidence of ascites. Stable small benign hepatic hemangioma.   07/21/2017 Tumor Marker   Patient's tumor was tested for the following markers: CA-125 Results of the tumor marker test revealed 84.1   07/21/2017 - 11/03/2017 Chemotherapy   She received carboplatin and taxol   08/11/2017 Adverse Reaction   She had severe neuropathy due to treatment. Does of chemo is reduced starting cycle 2 onwards   09/02/2017 Tumor Marker   Patient's tumor was tested for the following markers: CA-125 Results of the tumor marker test revealed 27.7   09/16/2017 Imaging   CT CHEST IMPRESSION  1. No acute process or evidence of metastatic disease in the chest. 2. Right upper lung bronchiectasis, consolidation, and architectural distortion are most likely post infectious or inflammatory and not significantly changed. 3. Aortic Atherosclerosis (ICD10-I70.0).  CT ABDOMEN AND PELVIS IMPRESSION  1. Response to therapy of nodal and peritoneal metastasis. 2. No new or progressive disease. 3. Caudate lobe hemangioma, as before. 4.  Bilateral renal cortical thinning. Similar minimal right-sided caliectasis and hydroureter. Likely secondary to low-grade obstruction by the dominant right pelvic implant.   09/22/2017 Tumor Marker   Patient's tumor was tested for the following markers: CA-125 Results of the tumor marker test revealed 18   11/03/2017 Tumor Marker   Patient's tumor was tested for the following markers: CA-125 Results of the tumor marker test revealed 13.4   11/25/2017 Imaging   1. Stable to slight interval decrease in size of nodal and peritoneal metastatic disease within the abdomen. 2. No new or progressive disease.   12/03/2017 - 09/11/2019 Chemotherapy   The patient is prescribed [place]   01/17/2018 Tumor Marker   Patient's tumor was tested for the following markers: CA-125 Results of the tumor marker test revealed 11.9   02/22/2018 Tumor Marker   Patient's tumor was tested for the following markers: CA-125 Results of the tumor marker test revealed 11.7   02/22/2018 Imaging   1. Improved nodular thickening along the vaginal cuff on the left and medial to the right iliac vessels, likely treated implants. No evidence of progressive peritoneal disease. 2. Stable hepatic hemangioma. No evidence of solid visceral organ metastases. 3. Bilateral renal cortical scarring.   05/19/2018 Tumor Marker   Patient's tumor was tested for the following markers: CA-125 Results of the tumor marker test revealed 9.9   08/10/2018 Imaging   Stable mild nodular thickening of the left vaginal cuff. No new or progressive disease within the abdomen or pelvis.  Stable small hepatic hemangioma.   09/21/2018 Tumor Marker   Patient's tumor was tested for the following markers: CA-125 Results of the tumor marker test revealed 10.3   11/11/2018 Tumor Marker   Patient's tumor was tested for the following markers: CA-125 Results of the tumor marker test revealed 10.5   12/15/2018 Tumor  Marker   Patient's tumor was tested for  the following markers: CA-125 Results of the tumor marker test revealed 12.6   01/06/2019 Tumor Marker   Patient's tumor was tested for the following markers: CA-125 Results of the tumor marker test revealed 14.6   01/12/2019 Imaging   1. Chronic extensive right upper lobe scarring changes and bronchiectasis. 2. No mediastinal or hilar mass or adenopathy and no findings for pulmonary metastatic disease. 3. Stable caudate lobe hemangioma. 4. No worrisome omental or peritoneal surface lesions. 5. Stable pericecal and small pelvic lymph nodes. No new or progressive findings.    09/11/2019 Imaging   Ct abdomen and pelvis Pericecal peritoneal metastatic implants   09/19/2019 -  Chemotherapy   The patient had carboplatin and gemxar for chemotherapy treatment.       REVIEW OF SYSTEMS:   Constitutional: Denies fevers, chills or abnormal weight loss Eyes: Denies blurriness of vision Ears, nose, mouth, throat, and face: Denies mucositis or sore throat Respiratory: Denies cough, dyspnea or wheezes Cardiovascular: Denies palpitation, chest discomfort or lower extremity swelling Gastrointestinal:  Denies nausea, heartburn or change in bowel habits Skin: Denies abnormal skin rashes Lymphatics: Denies new lymphadenopathy or easy bruising Neurological:Denies numbness, tingling or new weaknesses Behavioral/Psych: Mood is stable, no new changes  All other systems were reviewed with the patient and are negative.  I have reviewed the past medical history, past surgical history, social history and family history with the patient and they are unchanged from previous note.  ALLERGIES:  is allergic to codeine; lactulose; and latex.  MEDICATIONS:  Current Outpatient Medications  Medication Sig Dispense Refill  . apixaban (ELIQUIS) 5 MG TABS tablet Take 1 tablet (5 mg total) by mouth 2 (two) times daily. 60 tablet 5  . atorvastatin (LIPITOR) 40 MG tablet Take 40 mg by mouth at bedtime.    .  diphenhydrAMINE (BENADRYL) 25 MG tablet Take 25 mg by mouth every 6 (six) hours as needed for allergies.    . fluticasone (FLONASE) 50 MCG/ACT nasal spray Place 2 sprays into both nostrils 2 (two) times daily.    Marland Kitchen gabapentin (NEURONTIN) 600 MG tablet Take 1 tablet (600 mg total) by mouth 2 (two) times daily. 60 tablet 11  . guaifenesin (ROBITUSSIN) 100 MG/5ML syrup Take 200 mg by mouth 3 (three) times daily as needed for cough.    . lidocaine-prilocaine (EMLA) cream Apply to Porta-Cath 1-2 hours prior to access as directed. (Patient not taking: Reported on 03/28/2019) 30 g 1  . metoprolol succinate (TOPROL-XL) 25 MG 24 hr tablet Take 25 mg by mouth daily.   5  . Multiple Vitamin (MULTIVITAMIN WITH MINERALS) TABS tablet Take 1 tablet by mouth daily.    Marland Kitchen omega-3 acid ethyl esters (LOVAZA) 1 g capsule Take 1 g by mouth daily.    . ondansetron (ZOFRAN ODT) 4 MG disintegrating tablet Take 1 tablet (4 mg total) by mouth every 8 (eight) hours as needed for nausea or vomiting. 60 tablet 9  . oxyCODONE (OXY IR/ROXICODONE) 5 MG immediate release tablet Take 1 tablet (5 mg total) by mouth every 6 (six) hours as needed for severe pain. 60 tablet 0  . PARoxetine (PAXIL) 40 MG tablet Take 40 mg by mouth daily.  0  . polyethylene glycol (MIRALAX / GLYCOLAX) packet Take 17 g by mouth daily.    . verapamil (CALAN-SR) 240 MG CR tablet Take 1 tablet (240 mg total) by mouth daily. 30 tablet 0   No current facility-administered medications for  this visit.    Facility-Administered Medications Ordered in Other Visits  Medication Dose Route Frequency Provider Last Rate Last Dose  . gemcitabine (GEMZAR) 1,254 mg in sodium chloride 0.9 % 250 mL chemo infusion  600 mg/m2 (Treatment Plan Recorded) Intravenous Once Alvy Bimler, Gelsey Amyx, MD      . heparin lock flush 100 unit/mL  500 Units Intracatheter Once PRN Alvy Bimler, Wyett Narine, MD      . sodium chloride flush (NS) 0.9 % injection 10 mL  10 mL Intracatheter PRN Alvy Bimler, Amariah Kierstead, MD         PHYSICAL EXAMINATION: ECOG PERFORMANCE STATUS: 2 - Symptomatic, <50% confined to bed  Vitals:   11/07/19 1104  BP: 131/71  Pulse: 69  Resp: 18  Temp: 98 F (36.7 C)  SpO2: 100%   Filed Weights   11/07/19 1104  Weight: 204 lb 12.8 oz (92.9 kg)    GENERAL:alert, no distress and comfortable SKIN: skin color, texture, turgor are normal, no rashes or significant lesions EYES: normal, Conjunctiva are pink and non-injected, sclera clear OROPHARYNX:no exudate, no erythema and lips, buccal mucosa, and tongue normal  NECK: supple, thyroid normal size, non-tender, without nodularity LYMPH:  no palpable lymphadenopathy in the cervical, axillary or inguinal LUNGS: clear to auscultation and percussion with normal breathing effort HEART: regular rate & rhythm and no murmurs and no lower extremity edema ABDOMEN:abdomen soft, non-tender and normal bowel sounds Musculoskeletal:no cyanosis of digits and no clubbing  NEURO: alert & oriented x 3 with expressive dysphagia and persistent neurological deficit, unchanged compared to previous visit  LABORATORY DATA:  I have reviewed the data as listed    Component Value Date/Time   NA 142 11/07/2019 1050   NA 142 12/08/2017 1051   K 3.7 11/07/2019 1050   K 4.4 12/08/2017 1051   CL 106 11/07/2019 1050   CL 105 06/14/2017 1430   CO2 28 11/07/2019 1050   CO2 28 12/08/2017 1051   GLUCOSE 102 (H) 11/07/2019 1050   GLUCOSE 97 12/08/2017 1051   BUN 8 11/07/2019 1050   BUN 16.2 12/08/2017 1051   CREATININE 0.71 11/07/2019 1050   CREATININE 0.9 12/08/2017 1051   CALCIUM 9.2 11/07/2019 1050   CALCIUM 9.5 12/08/2017 1051   PROT 6.6 11/07/2019 1050   PROT 6.9 12/08/2017 1051   ALBUMIN 3.5 11/07/2019 1050   ALBUMIN 3.6 12/08/2017 1051   AST 19 11/07/2019 1050   AST 18 12/08/2017 1051   ALT 33 11/07/2019 1050   ALT 20 12/08/2017 1051   ALKPHOS 103 11/07/2019 1050   ALKPHOS 131 12/08/2017 1051   BILITOT 0.4 11/07/2019 1050   BILITOT 0.70  12/08/2017 1051   GFRNONAA >60 11/07/2019 1050   GFRAA >60 11/07/2019 1050    No results found for: SPEP, UPEP  Lab Results  Component Value Date   WBC 2.9 (L) 11/07/2019   NEUTROABS 1.3 (L) 11/07/2019   HGB 9.3 (L) 11/07/2019   HCT 28.1 (L) 11/07/2019   MCV 119.6 (H) 11/07/2019   PLT 353 11/07/2019      Chemistry      Component Value Date/Time   NA 142 11/07/2019 1050   NA 142 12/08/2017 1051   K 3.7 11/07/2019 1050   K 4.4 12/08/2017 1051   CL 106 11/07/2019 1050   CL 105 06/14/2017 1430   CO2 28 11/07/2019 1050   CO2 28 12/08/2017 1051   BUN 8 11/07/2019 1050   BUN 16.2 12/08/2017 1051   CREATININE 0.71 11/07/2019 1050   CREATININE 0.9  12/08/2017 1051      Component Value Date/Time   CALCIUM 9.2 11/07/2019 1050   CALCIUM 9.5 12/08/2017 1051   ALKPHOS 103 11/07/2019 1050   ALKPHOS 131 12/08/2017 1051   AST 19 11/07/2019 1050   AST 18 12/08/2017 1051   ALT 33 11/07/2019 1050   ALT 20 12/08/2017 1051   BILITOT 0.4 11/07/2019 1050   BILITOT 0.70 12/08/2017 1051      All questions were answered. The patient knows to call the clinic with any problems, questions or concerns. No barriers to learning was detected.  I spent 15 minutes counseling the patient face to face. The total time spent in the appointment was 20 minutes and more than 50% was on counseling and review of test results  Heath Lark, MD 11/07/2019 12:06 PM

## 2019-11-07 NOTE — Assessment & Plan Note (Signed)
She have severe, persistent pancytopenia likely due to recent side effects of chemotherapy Her recent B12 and iron studies are adequate Her pancytopenia is due to treatment She is not symptomatic She does not need transfusion support

## 2019-11-07 NOTE — Assessment & Plan Note (Signed)
She has expressive dysphagia from prior history of stroke I recommend her daughter to come with her for doctor's visit appointment in the future to facilitate accurate communication 

## 2019-11-09 ENCOUNTER — Telehealth: Payer: Self-pay | Admitting: Hematology and Oncology

## 2019-11-09 NOTE — Telephone Encounter (Signed)
Not able to reach patient or leave message re December appointments. Schedule mailed.

## 2019-11-20 ENCOUNTER — Ambulatory Visit (HOSPITAL_COMMUNITY)
Admission: RE | Admit: 2019-11-20 | Discharge: 2019-11-20 | Disposition: A | Discharge status: Home / Self Care | Payer: Medicare Other | Source: Ambulatory Visit | Attending: Hematology and Oncology | Admitting: Hematology and Oncology

## 2019-11-20 ENCOUNTER — Encounter (HOSPITAL_COMMUNITY): Payer: Self-pay

## 2019-11-20 ENCOUNTER — Other Ambulatory Visit: Payer: Self-pay

## 2019-11-20 DIAGNOSIS — C562 Malignant neoplasm of left ovary: Secondary | ICD-10-CM | POA: Insufficient documentation

## 2019-11-20 HISTORY — DX: Malignant neoplasm of unspecified ovary: C56.9

## 2019-11-20 MED ORDER — SODIUM CHLORIDE (PF) 0.9 % IJ SOLN
INTRAMUSCULAR | Status: AC
  Filled 2019-11-20: qty 50

## 2019-11-20 MED ORDER — IOHEXOL 300 MG/ML  SOLN
100.0000 mL | Freq: Once | INTRAMUSCULAR | Status: AC | PRN
  Administered 2019-11-20: 100 mL via INTRAVENOUS

## 2019-11-21 ENCOUNTER — Encounter: Payer: Self-pay | Admitting: Hematology and Oncology

## 2019-11-21 ENCOUNTER — Inpatient Hospital Stay: Payer: Medicare Other

## 2019-11-21 ENCOUNTER — Other Ambulatory Visit: Payer: Self-pay | Admitting: Hematology and Oncology

## 2019-11-21 ENCOUNTER — Other Ambulatory Visit: Payer: Self-pay

## 2019-11-21 ENCOUNTER — Inpatient Hospital Stay (HOSPITAL_BASED_OUTPATIENT_CLINIC_OR_DEPARTMENT_OTHER): Payer: Medicare Other | Admitting: Hematology and Oncology

## 2019-11-21 DIAGNOSIS — D61818 Other pancytopenia: Secondary | ICD-10-CM

## 2019-11-21 DIAGNOSIS — Z7189 Other specified counseling: Secondary | ICD-10-CM

## 2019-11-21 DIAGNOSIS — C800 Disseminated malignant neoplasm, unspecified: Secondary | ICD-10-CM

## 2019-11-21 DIAGNOSIS — C562 Malignant neoplasm of left ovary: Secondary | ICD-10-CM

## 2019-11-21 DIAGNOSIS — C563 Malignant neoplasm of bilateral ovaries: Secondary | ICD-10-CM

## 2019-11-21 DIAGNOSIS — C561 Malignant neoplasm of right ovary: Secondary | ICD-10-CM

## 2019-11-21 DIAGNOSIS — K5909 Other constipation: Secondary | ICD-10-CM

## 2019-11-21 DIAGNOSIS — Z95828 Presence of other vascular implants and grafts: Secondary | ICD-10-CM

## 2019-11-21 DIAGNOSIS — Z5111 Encounter for antineoplastic chemotherapy: Secondary | ICD-10-CM | POA: Diagnosis not present

## 2019-11-21 LAB — CMP (CANCER CENTER ONLY)
ALT: 19 U/L (ref 0–44)
AST: 17 U/L (ref 15–41)
Albumin: 3.6 g/dL (ref 3.5–5.0)
Alkaline Phosphatase: 101 U/L (ref 38–126)
Anion gap: 12 (ref 5–15)
BUN: 8 mg/dL (ref 8–23)
CO2: 25 mmol/L (ref 22–32)
Calcium: 9.1 mg/dL (ref 8.9–10.3)
Chloride: 108 mmol/L (ref 98–111)
Creatinine: 0.75 mg/dL (ref 0.44–1.00)
GFR, Est AFR Am: >60 mL/min (ref >60)
GFR, Estimated: >60 mL/min (ref >60)
Glucose, Bld: 136 mg/dL — ABNORMAL HIGH (ref 70–99)
Potassium: 3.6 mmol/L (ref 3.5–5.1)
Sodium: 145 mmol/L (ref 135–145)
Total Bilirubin: 0.4 mg/dL (ref 0.3–1.2)
Total Protein: 6.7 g/dL (ref 6.5–8.1)

## 2019-11-21 LAB — CBC WITH DIFFERENTIAL (CANCER CENTER ONLY)
Abs Immature Granulocytes: 0.01 10*3/uL (ref 0.00–0.07)
Basophils Absolute: 0 10*3/uL (ref 0.0–0.1)
Basophils Relative: 0 %
Eosinophils Absolute: 0 10*3/uL (ref 0.0–0.5)
Eosinophils Relative: 1 %
HCT: 29.4 % — ABNORMAL LOW (ref 36.0–46.0)
Hemoglobin: 9.6 g/dL — ABNORMAL LOW (ref 12.0–15.0)
Immature Granulocytes: 0 %
Lymphocytes Relative: 33 %
Lymphs Abs: 0.9 10*3/uL (ref 0.7–4.0)
MCH: 39.2 pg — ABNORMAL HIGH (ref 26.0–34.0)
MCHC: 32.7 g/dL (ref 30.0–36.0)
MCV: 120 fL — ABNORMAL HIGH (ref 80.0–100.0)
Monocytes Absolute: 0.5 10*3/uL (ref 0.1–1.0)
Monocytes Relative: 18 %
Neutro Abs: 1.3 10*3/uL — ABNORMAL LOW (ref 1.7–7.7)
Neutrophils Relative %: 48 %
Platelet Count: 262 10*3/uL (ref 150–400)
RBC: 2.45 MIL/uL — ABNORMAL LOW (ref 3.87–5.11)
RDW: 17.7 % — ABNORMAL HIGH (ref 11.5–15.5)
WBC Count: 2.7 10*3/uL — ABNORMAL LOW (ref 4.0–10.5)
nRBC: 0 % (ref 0.0–0.2)

## 2019-11-21 MED ORDER — HEPARIN SOD (PORK) LOCK FLUSH 100 UNIT/ML IV SOLN
500.0000 [IU] | Freq: Once | INTRAVENOUS | Status: AC | PRN
  Administered 2019-11-21: 500 [IU]
  Filled 2019-11-21: qty 5

## 2019-11-21 MED ORDER — FAMOTIDINE IN NACL 20-0.9 MG/50ML-% IV SOLN
20.0000 mg | Freq: Once | INTRAVENOUS | Status: AC
  Administered 2019-11-21: 20 mg via INTRAVENOUS

## 2019-11-21 MED ORDER — SODIUM CHLORIDE 0.9 % IV SOLN
Freq: Once | INTRAVENOUS | Status: AC
  Filled 2019-11-21: qty 5

## 2019-11-21 MED ORDER — DIPHENHYDRAMINE HCL 50 MG/ML IJ SOLN
INTRAMUSCULAR | Status: AC
  Filled 2019-11-21: qty 1

## 2019-11-21 MED ORDER — PALONOSETRON HCL INJECTION 0.25 MG/5ML
INTRAVENOUS | Status: AC
  Filled 2019-11-21: qty 5

## 2019-11-21 MED ORDER — SODIUM CHLORIDE 0.9% FLUSH
10.0000 mL | Freq: Once | INTRAVENOUS | Status: AC
  Administered 2019-11-21: 10 mL
  Filled 2019-11-21: qty 10

## 2019-11-21 MED ORDER — SODIUM CHLORIDE 0.9 % IV SOLN
Freq: Once | INTRAVENOUS | Status: AC
  Filled 2019-11-21: qty 250

## 2019-11-21 MED ORDER — SODIUM CHLORIDE 0.9 % IV SOLN
600.0000 mg/m2 | Freq: Once | INTRAVENOUS | Status: AC
  Administered 2019-11-21: 1254 mg via INTRAVENOUS
  Filled 2019-11-21: qty 32.98

## 2019-11-21 MED ORDER — FAMOTIDINE IN NACL 20-0.9 MG/50ML-% IV SOLN
INTRAVENOUS | Status: AC
  Filled 2019-11-21: qty 50

## 2019-11-21 MED ORDER — PALONOSETRON HCL INJECTION 0.25 MG/5ML
0.2500 mg | Freq: Once | INTRAVENOUS | Status: AC
  Administered 2019-11-21: 0.25 mg via INTRAVENOUS

## 2019-11-21 MED ORDER — SODIUM CHLORIDE 0.9 % IV SOLN
318.4000 mg | Freq: Once | INTRAVENOUS | Status: AC
  Administered 2019-11-21: 320 mg via INTRAVENOUS
  Filled 2019-11-21: qty 32

## 2019-11-21 MED ORDER — SODIUM CHLORIDE 0.9% FLUSH
10.0000 mL | INTRAVENOUS | Status: DC | PRN
  Administered 2019-11-21: 10 mL
  Filled 2019-11-21: qty 10

## 2019-11-21 MED ORDER — DIPHENHYDRAMINE HCL 50 MG/ML IJ SOLN
25.0000 mg | Freq: Once | INTRAMUSCULAR | Status: AC
  Administered 2019-11-21: 25 mg via INTRAVENOUS

## 2019-11-21 NOTE — Assessment & Plan Note (Signed)
We have discussed goals of care many times in the past She is in agreement to transition to maintenance therapy after completion of cycle 4 of treatment

## 2019-11-21 NOTE — Assessment & Plan Note (Signed)
I have reviewed CT imaging with the patient and her daughter She has great response to treatment with minimum side effects I recommend we proceed with cycle 4 of treatment as scheduled After that, I recommend discontinuation of carboplatin and switching her to maintenance gemcitabine to be given on day 1, 8 rest day 15 for cycle of every 21 days indefinitely I also recommend ordering CT imaging every 3 to 4 months for surveillance.

## 2019-11-21 NOTE — Progress Notes (Signed)
[place] OFFICE PROGRESS NOTE  Patient Care Team: Helane Rima, MD as PCP - General (Family Medicine) Encarnacion Slates, MD as Referring Physician (Neurology) Cameron Sprang, MD as Consulting Physician (Neurology)  ASSESSMENT & PLAN:  Ovarian cancer [facility]) I have reviewed CT imaging with the patient and her daughter She has great response to treatment with minimum side effects I recommend we proceed with cycle 4 of treatment as scheduled After that, I recommend discontinuation of carboplatin and switching her to maintenance gemcitabine to be given on day 1, 8 rest day 15 for cycle of every 21 days indefinitely I also recommend ordering CT imaging every 3 to 4 months for surveillance.  Pancytopenia, acquired ([place]) She have severe, persistent pancytopenia likely due to recent side effects of chemotherapy Her recent B12 and iron studies are adequate Her pancytopenia is due to treatment She is not symptomatic She does not need transfusion support  Other constipation We discussed the importance of aggressive laxative therapy  Goals of care, counseling/discussion We have discussed goals of care many times in the past She is in agreement to transition to maintenance therapy after completion of cycle 4 of treatment   No orders of the defined types were placed in this encounter.   INTERVAL HISTORY: Please see below for problem oriented charting. She returns for further follow-up with her daughter She tolerated recent chemotherapy well Denies nausea She continues to have chronic constipation but it is stable No recent infection, fever or chills The patient denies any recent signs or symptoms of bleeding such as spontaneous epistaxis, hematuria or hematochezia.  SUMMARY OF ONCOLOGIC HISTORY: Oncology History Overview Note  Negative genetic testing Progressed on Nir[place]arib   Carcinomatosis ([place])  12/06/2015 Initial Diagnosis   Carcinomatosis ([place])   Ovarian  cancer, bilateral ([place])  12/24/2015 Initial Diagnosis   Ovarian cancer, bilateral ([place])   Ovarian cancer ([place])  11/28/2015 Imaging   Ct scan of abdomen: Peritoneal carcinomatosis and pelvic/inguinal adenopathy. Gynecologic primary is favored.   12/17/2015 Pathology Results   Lymph node, needle/core biopsy, L inguinal LAN - METASTATIC ADENOCARCINOMA. Microscopic Comment Immunohistochemistry will be performed and reported as an addendum. (JDP:ecj 12/18/2015) ADDENDUM: Immunohistochemistry shows strong positivity with cytokeratin 7, estrogen receptor (ER), progesterone receptor (PR), p53 and WT1. Negative markers are cytokeratin 20 , CDX- 2 and gross cystic disease fluid protein. 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 (JDP:kh 12-19-15   12/17/2015 Procedure   Technically successful ultrasound guided core left inguinal adenopathy biopsy   12/24/2015 Tumor Marker   Patient's tumor was tested for the following markers: CA-125 Results of the tumor marker test revealed 608.2   12/27/2015 - 02/28/2016 Chemotherapy   She received neoadjuvant chemo x 3 cycles   01/01/2016 Tumor Marker   Patient's tumor was tested for the following markers: CA-125 Results of the tumor marker test revealed 741.6   01/29/2016 Procedure   Successful placement of a right internal jugular approach power injectable Port-A-Cath. The catheter is ready for immediate use   01/29/2016 Imaging   US abdomen 1. Hyperechoic 2.5 x 1.0 x 1.6 cm lesion in the region the caudate lobe of the liver. Similar finding noted on prior CT of 11/28/2015. This could represent hemangioma. This could represent a malignancy including metastatic disease. 2. Exam otherwise unremarkable. No gallstones. No biliary distention.   02/03/2016 Tumor Marker   Patient's tumor was tested for the following markers: CA-125 Results of the  tumor marker test  revealed 137.1   02/20/2016 Imaging   MRI brain No acute infarct.  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  No intracranial mass.  Moderate small vessel disease changes.  Global atrophy without hydrocephalus.  Expanded partially empty sella without secondary findings of pseudotumor cerebri.  Minimal mucosal thickening ethmoid sinus air cells. Small air-fluid levels maxillary sinuses bilaterally may indicate changes of acute sinusitis.    02/28/2016 Tumor Marker   Patient's tumor was tested for the following markers: CA-125 Results of the tumor marker test revealed 33.6   03/02/2016 Imaging   CT chest, abdomen and pelvis 1. Today's study demonstrates a positive response to therapy with resolution of the previously noted malignant ascites, significant regression of previously noted peritoneal implants, and regression of previously noted lymphadenopathy in the abdomen and pelvis. 2. No definite evidence to suggest metastatic disease to the thorax. 3. Stable 1.0 x 1.7 cm intermediate attenuation lesion associated with the posterior aspect of segment 1 of the liver, favored to represent a mildly proteinaceous hepatic cyst. The possibility of a peritoneal implant in this region is not entirely excluded, but is not favored on today's examination. 4. Extensive post infectious scarring inguinal upper right lung, likely sequela of prior necrotizing pneumonia. 5. Multiple tiny pulmonary nodules scattered throughout the lungs bilaterally all measuring 4 mm or less. These are nonspecific, but favored to be benign. Given the patient's history of primary gynecologic malignancy, attention on followup studies is recommended to ensure the stability of these findings. 6. Additional incidental findings, as above   03/14/2016 Imaging   CT angiogram 1. No pulmonary emboli. 2. Chronic changes to the right upper lobe. 3. Stable lesion in the caudate lobe of the liver. 4. Stable  soft tissue prominence to the right of the trachea as described above. 5. Stable small nodule in the right lung.    03/24/2016 Pathology Results   1. Omentum, resection for tumor - FOCAL ADENOCARCINOMA ASSOCIATED WITH EXTENSIVE FIBROSIS, INFLAMMATION AND HEMOSIDERIN DEPOSITION. 2. Uterus +/- tubes/ovaries, neoplastic, cervix - CERVIX: SLIGHT CERVICITIS AND SQUAMOUS METAPLASIA. - ENDOMETRIUM: ATROPHIC, NO HYPERPLASIA OR MALIGNANCY. - MYOMETRIUM: LEIOMYOMATA WITH DEGENERATIVE CHANGES. NO MALIGNANCY. - UTERINE SEROSA: FOCAL ADENOCARCINOMA ASSOCIATED WITH ADHESIONS. - RIGHT OVARY: BENIGN CALCIFIED NODULE AND BENIGN SEROUS CYST. NO MALIGNANCY IDENTIFIED. - RIGHT FALLOPIAN TUBE: UNREMARKABLE. NO MALIGNANCY IDENTIFIED. - LEFT OVARY: FOCAL ADENOCARCINOMA ASSOCIATED WITH EXTENSIVE FIBROSIS. - LEFT FALLOPIAN TUBE: HYDROSALPINX. NO MALIGNANCY IDENTIFIED. 3. Lymph node, biopsy, right external iliac - METASTATIC ADENOCARCINOMA, 4. Soft tissue, biopsy, left para colic gutter - FOCAL ADENOCARCINOMA ASSOCIATED WITH FIBROSIS. Microscopic Comment 2. OVARY Specimen(s): Uterus with bilateral ovaries, omentum, right external iliac lymph node and left paracolic gutter. Procedure: (including lymph node sampling) Hysterectomy with bilateral salpingo-oophorectomy, omentectomy, lymph node biopsy and paracolic gutter biopsy. Primary tumor site (including laterality): Left ovary. Ovarian surface involvement: Yes Ovarian capsule intact without fragmentation: N/A Maximum tumor size (cm): 2 cm, 2 cm Histologic type: Serous carcinoma. Grade: 3 Peritoneal implants: (specify invasive or non-invasive): Left paracolic gutter positive for carcinoma Pelvic extension (list additional structures on separate lines and if involved): Omentum, right paracolic gutter and uterine serosa. Lymph nodes: number examined 1 ; number positive 1 TNM code: ypT3a, ypN1b, ypMX FIGO Stage (based on pathologic findings, needs clinical  correlation): IIIA2 Comments: The left ovary has a 2 cm nodule, which is composed primarily of fibrous tissue with small foci of residual adenocarcinoma. There is also microscopic involvement of the uterine serosa, the left paracolic gutter  biopsy and the omentum. The left paracolic gutter and omental involvement is microscopic and both are associated with extensive fibrosis with focal hemosiderin deposition. The right external iliac node is completely replaced by metastatic adenocarcinoma with serous features. (JDP:ecj 03/30/2016)   03/24/2016 Surgery   Operation: Robotic-assisted laparoscopic total hysterectomy with bilateral salpingoophorectomy, omentectomy, radical tumor debulking  Surgeon: Donaciano Eva  Operative Findings:  : small nodules in omentum, no ascites. Omental nodule (2cm) adherent to lower anterior abdominal wall. 2cm left colonic gutter cystic nodule. 6cm right external iliac lymph node. Grossly normal ovaries and tubes. Fibroid uterus. No gross residual disease at completion of surgery representing R0 optimal/complete resection.    04/17/2016 Imaging   CT abdomen and pelvis 1. There is no evidence of acute inflammatory process within abdomen or pelvis. 2. Stable low-density lesion within caudate lobe of the liver. No new hepatic lesions are noted. 3. Again noted lobulated renal contour and multifocal renal cortical scarring. No hydronephrosis or hydroureter. 4. No significant mesenteric adenopathy. No retroperitoneal adenopathy. Stable minimal residual peritoneal thickening within pelvis. No new pelvic implants or pelvic ascites. 5. Status post hysterectomy.  Unremarkable urinary bladder. 6. Moderate stool within colon as described above. No evidence of colitis.    04/24/2016 - 06/19/2016 Chemotherapy   She received 3 more cycles of chemo after surgery   05/14/2016 Tumor Marker   Patient's tumor was tested for the following markers: CA-125 Results of the tumor  marker test revealed 22.5   06/04/2016 Tumor Marker   Patient's tumor was tested for the following markers: CA-125 Results of the tumor marker test revealed 13.7   07/07/2016 Genetic Testing   Patient has genetic testing done for breast/ovasrian cancer panel Results revealed patient has no actionable mutation   07/23/2016 Imaging   Ct abdomen and pelvis 1. Heterogeneously enhancing 4.9 x 3.2 x 4.4 cm mass along the right pelvic sidewall may represent locally recurrent disease or malignant lymphadenopathy. 2. No other signs of definite metastatic disease noted elsewhere in the abdomen or pelvis. 3. Stable low-attenuation hepatic lesion in the caudate lobe of the liver, which appears to demonstrates some progressive centripetal filling on delayed images. This lesion is presumably benign given its stability compared to prior studies, and is favored to represent a small cavernous hemangioma. 4. Cardiomegaly with biatrial dilatation, which is very severe on the right side. 5. Aortic atherosclerosis. 6. Additional incidental findings, as above.   07/23/2016 Tumor Marker   Patient's tumor was tested for the following markers: CA-125 Results of the tumor marker test revealed 12.3   09/30/2016 Imaging   Ct abdomen and pelvis: 1. 4.9 cm heterogeneously enhancing mass seen along the right pelvic sidewall previously has almost completely resolved. There is some ill-defined soft tissue attenuation/peritoneal thickening in this region today but no discrete measurable lesion is evident. 2. No new or progressive findings on today's exam. 3. Stable hypo attenuating lesion in the caudate lobe of the liver. 4. Subtle aortic atherosclerosis   09/30/2016 Tumor Marker   Patient's tumor was tested for the following markers: CA-125 Results of the tumor marker test revealed 9.6   10/05/2016 Pathology Results   Vagina, biopsy, left cuff - BENIGN FIBROEPITHELIAL (STROMAL) POLYP. - NO DYSPLASIA, ATYPIA OR  MALIGNANCY IDENTIFIED.   01/14/2017 Tumor Marker   Patient's tumor was tested for the following markers: CA-125 Results of the tumor marker test revealed 11.9   05/05/2017 Tumor Marker   Patient's tumor was tested for the following markers: CA-125 Results of  the tumor marker test revealed 28   06/14/2017 Tumor Marker   Patient's tumor was tested for the following markers: CA-125 Results of the tumor marker test revealed 45.7   06/24/2017 Imaging   Ct abdomen and pelvis: Increased peritoneal metastatic disease in abdomen and pelvis since prior exam. No evidence of ascites. Stable small benign hepatic hemangioma.   07/21/2017 Tumor Marker   Patient's tumor was tested for the following markers: CA-125 Results of the tumor marker test revealed 84.1   07/21/2017 - 11/03/2017 Chemotherapy   She received carboplatin and taxol   08/11/2017 Adverse Reaction   She had severe neuropathy due to treatment. Does of chemo is reduced starting cycle 2 onwards   09/02/2017 Tumor Marker   Patient's tumor was tested for the following markers: CA-125 Results of the tumor marker test revealed 27.7   09/16/2017 Imaging   CT CHEST IMPRESSION  1. No acute process or evidence of metastatic disease in the chest. 2. Right upper lung bronchiectasis, consolidation, and architectural distortion are most likely post infectious or inflammatory and not significantly changed. 3. Aortic Atherosclerosis (ICD10-I70.0).  CT ABDOMEN AND PELVIS IMPRESSION  1. Response to therapy of nodal and peritoneal metastasis. 2. No new or progressive disease. 3. Caudate lobe hemangioma, as before. 4. Bilateral renal cortical thinning. Similar minimal right-sided caliectasis and hydroureter. Likely secondary to low-grade obstruction by the dominant right pelvic implant.   09/22/2017 Tumor Marker   Patient's tumor was tested for the following markers: CA-125 Results of the tumor marker test revealed 18   11/03/2017 Tumor Marker    Patient's tumor was tested for the following markers: CA-125 Results of the tumor marker test revealed 13.4   11/25/2017 Imaging   1. Stable to slight interval decrease in size of nodal and peritoneal metastatic disease within the abdomen. 2. No new or progressive disease.   12/03/2017 - 09/11/2019 Chemotherapy   The patient is prescribed [place]   01/17/2018 Tumor Marker   Patient's tumor was tested for the following markers: CA-125 Results of the tumor marker test revealed 11.9   02/22/2018 Tumor Marker   Patient's tumor was tested for the following markers: CA-125 Results of the tumor marker test revealed 11.7   02/22/2018 Imaging   1. Improved nodular thickening along the vaginal cuff on the left and medial to the right iliac vessels, likely treated implants. No evidence of progressive peritoneal disease. 2. Stable hepatic hemangioma. No evidence of solid visceral organ metastases. 3. Bilateral renal cortical scarring.   05/19/2018 Tumor Marker   Patient's tumor was tested for the following markers: CA-125 Results of the tumor marker test revealed 9.9   08/10/2018 Imaging   Stable mild nodular thickening of the left vaginal cuff. No new or progressive disease within the abdomen or pelvis.  Stable small hepatic hemangioma.   09/21/2018 Tumor Marker   Patient's tumor was tested for the following markers: CA-125 Results of the tumor marker test revealed 10.3   11/11/2018 Tumor Marker   Patient's tumor was tested for the following markers: CA-125 Results of the tumor marker test revealed 10.5   12/15/2018 Tumor Marker   Patient's tumor was tested for the following markers: CA-125 Results of the tumor marker test revealed 12.6   01/06/2019 Tumor Marker   Patient's tumor was tested for the following markers: CA-125 Results of the tumor marker test revealed 14.6   01/12/2019 Imaging   1. Chronic extensive right upper lobe scarring changes and bronchiectasis. 2. No mediastinal or  hilar  mass or adenopathy and no findings for pulmonary metastatic disease. 3. Stable caudate lobe hemangioma. 4. No worrisome omental or peritoneal surface lesions. 5. Stable pericecal and small pelvic lymph nodes. No new or progressive findings.    09/11/2019 Imaging   Ct abdomen and pelvis Pericecal peritoneal metastatic implants   09/19/2019 -  Chemotherapy   The patient had carboplatin and gemxar for chemotherapy treatment.     11/20/2019 Imaging   1. Mild decrease in size of peritoneal tumor implant along the lateral aspect of the cecum. 2. Stable tiny sub-cm mesenteric soft tissue nodules or lymph nodes in right lower quadrant and anterior pelvic mesentery. 3. No new or progressive disease identified within the abdomen or pelvis.     REVIEW OF SYSTEMS:   Constitutional: Denies fevers, chills or abnormal weight loss Eyes: Denies blurriness of vision Ears, nose, mouth, throat, and face: Denies mucositis or sore throat Respiratory: Denies cough, dyspnea or wheezes Cardiovascular: Denies palpitation, chest discomfort or lower extremity swelling Gastrointestinal:  Denies nausea, heartburn or change in bowel habits Skin: Denies abnormal skin rashes Lymphatics: Denies new lymphadenopathy or easy bruising Neurological:Denies numbness, tingling or new weaknesses Behavioral/Psych: Mood is stable, no new changes  All other systems were reviewed with the patient and are negative.  I have reviewed the past medical history, past surgical history, social history and family history with the patient and they are unchanged from previous note.  ALLERGIES:  is allergic to codeine; lactulose; and latex.  MEDICATIONS:  Current Outpatient Medications  Medication Sig Dispense Refill  . apixaban (ELIQUIS) 5 MG TABS tablet Take 1 tablet (5 mg total) by mouth 2 (two) times daily. 60 tablet 5  . atorvastatin (LIPITOR) 40 MG tablet Take 40 mg by mouth at bedtime.    . diphenhydrAMINE (BENADRYL)  25 MG tablet Take 25 mg by mouth every 6 (six) hours as needed for allergies.    . fluticasone (FLONASE) 50 MCG/ACT nasal spray Place 2 sprays into both nostrils 2 (two) times daily.    Marland Kitchen gabapentin (NEURONTIN) 600 MG tablet Take 1 tablet (600 mg total) by mouth 2 (two) times daily. 60 tablet 11  . guaifenesin (ROBITUSSIN) 100 MG/5ML syrup Take 200 mg by mouth 3 (three) times daily as needed for cough.    . lidocaine-prilocaine (EMLA) cream Apply to Porta-Cath 1-2 hours prior to access as directed. (Patient not taking: Reported on 03/28/2019) 30 g 1  . metoprolol succinate (TOPROL-XL) 25 MG 24 hr tablet Take 25 mg by mouth daily.   5  . Multiple Vitamin (MULTIVITAMIN WITH MINERALS) TABS tablet Take 1 tablet by mouth daily.    Marland Kitchen omega-3 acid ethyl esters (LOVAZA) 1 g capsule Take 1 g by mouth daily.    . ondansetron (ZOFRAN ODT) 4 MG disintegrating tablet Take 1 tablet (4 mg total) by mouth every 8 (eight) hours as needed for nausea or vomiting. 60 tablet 9  . oxyCODONE (OXY IR/ROXICODONE) 5 MG immediate release tablet Take 1 tablet (5 mg total) by mouth every 6 (six) hours as needed for severe pain. 60 tablet 0  . PARoxetine (PAXIL) 40 MG tablet Take 40 mg by mouth daily.  0  . polyethylene glycol (MIRALAX / GLYCOLAX) packet Take 17 g by mouth daily.    . verapamil (CALAN-SR) 240 MG CR tablet Take 1 tablet (240 mg total) by mouth daily. 30 tablet 0   No current facility-administered medications for this visit.   Facility-Administered Medications Ordered in Other Visits  Medication Dose  Route Frequency Provider Last Rate Last Admin  . CARBOplatin (PARAPLATIN) 320 mg in sodium chloride 0.9 % 250 mL chemo infusion  320 mg Intravenous Once Alvy Bimler, Garnett Nunziata, MD      . gemcitabine (GEMZAR) 1,254 mg in sodium chloride 0.9 % 250 mL chemo infusion  600 mg/m2 (Treatment Plan Recorded) Intravenous Once Alvy Bimler, Vinicio Lynk, MD      . heparin lock flush 100 unit/mL  500 Units Intracatheter Once PRN Alvy Bimler, Traevion Poehler, MD      .  sodium chloride flush (NS) 0.9 % injection 10 mL  10 mL Intracatheter PRN Alvy Bimler, Shontia Gillooly, MD        PHYSICAL EXAMINATION: ECOG PERFORMANCE STATUS: 2 - Symptomatic, <50% confined to bed  Vitals:   11/21/19 1106  BP: 119/77  Pulse: (!) 49  Resp: 18  Temp: 98.2 F (36.8 C)  SpO2: 100%   Filed Weights   11/21/19 1106  Weight: 200 lb 14.4 oz (91.1 kg)    GENERAL:alert, no distress and comfortable SKIN: skin color, texture, turgor are normal, no rashes or significant lesions EYES: normal, Conjunctiva are pink and non-injected, sclera clear OROPHARYNX:no exudate, no erythema and lips, buccal mucosa, and tongue normal  NECK: supple, thyroid normal size, non-tender, without nodularity LYMPH:  no palpable lymphadenopathy in the cervical, axillary or inguinal LUNGS: clear to auscultation and percussion with normal breathing effort HEART: regular rate & rhythm and no murmurs and no lower extremity edema ABDOMEN:abdomen soft, non-tender and normal bowel sounds Musculoskeletal:no cyanosis of digits and no clubbing  NEURO: alert & oriented x 3 with expressive dysphagia secondary to stroke, unchanged  LABORATORY DATA:  I have reviewed the data as listed    Component Value Date/Time   NA 145 11/21/2019 1055   NA 142 12/08/2017 1051   K 3.6 11/21/2019 1055   K 4.4 12/08/2017 1051   CL 108 11/21/2019 1055   CL 105 06/14/2017 1430   CO2 25 11/21/2019 1055   CO2 28 12/08/2017 1051   GLUCOSE 136 (H) 11/21/2019 1055   GLUCOSE 97 12/08/2017 1051   BUN 8 11/21/2019 1055   BUN 16.2 12/08/2017 1051   CREATININE 0.75 11/21/2019 1055   CREATININE 0.9 12/08/2017 1051   CALCIUM 9.1 11/21/2019 1055   CALCIUM 9.5 12/08/2017 1051   PROT 6.7 11/21/2019 1055   PROT 6.9 12/08/2017 1051   ALBUMIN 3.6 11/21/2019 1055   ALBUMIN 3.6 12/08/2017 1051   AST 17 11/21/2019 1055   AST 18 12/08/2017 1051   ALT 19 11/21/2019 1055   ALT 20 12/08/2017 1051   ALKPHOS 101 11/21/2019 1055   ALKPHOS 131  12/08/2017 1051   BILITOT 0.4 11/21/2019 1055   BILITOT 0.70 12/08/2017 1051   GFRNONAA >60 11/21/2019 1055   GFRAA >60 11/21/2019 1055    No results found for: SPEP, UPEP  Lab Results  Component Value Date   WBC 2.7 (L) 11/21/2019   NEUTROABS 1.3 (L) 11/21/2019   HGB 9.6 (L) 11/21/2019   HCT 29.4 (L) 11/21/2019   MCV 120.0 (H) 11/21/2019   PLT 262 11/21/2019      Chemistry      Component Value Date/Time   NA 145 11/21/2019 1055   NA 142 12/08/2017 1051   K 3.6 11/21/2019 1055   K 4.4 12/08/2017 1051   CL 108 11/21/2019 1055   CL 105 06/14/2017 1430   CO2 25 11/21/2019 1055   CO2 28 12/08/2017 1051   BUN 8 11/21/2019 1055   BUN 16.2 12/08/2017 1051  CREATININE 0.75 11/21/2019 1055   CREATININE 0.9 12/08/2017 1051      Component Value Date/Time   CALCIUM 9.1 11/21/2019 1055   CALCIUM 9.5 12/08/2017 1051   ALKPHOS 101 11/21/2019 1055   ALKPHOS 131 12/08/2017 1051   AST 17 11/21/2019 1055   AST 18 12/08/2017 1051   ALT 19 11/21/2019 1055   ALT 20 12/08/2017 1051   BILITOT 0.4 11/21/2019 1055   BILITOT 0.70 12/08/2017 1051       RADIOGRAPHIC STUDIES: I have personally reviewed the radiological images as listed and agreed with the findings in the report. CT Abdomen Pelvis W Contrast  Result Date: 11/20/2019 CLINICAL DATA:  Follow-up metastatic left ovarian carcinoma. Ongoing chemotherapy. EXAM: CT ABDOMEN AND PELVIS WITH CONTRAST TECHNIQUE: Multidetector CT imaging of the abdomen and pelvis was performed using the standard protocol following bolus administration of intravenous contrast. CONTRAST:  171m OMNIPAQUE IOHEXOL 300 MG/ML  SOLN COMPARISON:  09/11/2019 FINDINGS: Lower Chest: No acute findings. Hepatobiliary: Stable small approximately 2 cm benign hemangioma in the caudate lobe. No other liver masses identified. Gallbladder is unremarkable. No evidence of biliary ductal dilatation. Pancreas:  No mass or inflammatory changes. Spleen: Within normal limits in  size and appearance. Adrenals/Urinary Tract: No masses identified. Mild-to-moderate bilateral renal parenchymal scarring. Tiny right renal cyst. No evidence of hydronephrosis. Stomach/Bowel: No evidence of obstruction, inflammatory process or abnormal fluid collections. Vascular/Lymphatic:  No significant abnormality. Reproductive: Prior hysterectomy. No evidence of adnexal mass or ascites. Other: Soft tissue nodule along the lateral aspect of the cecum shows increased calcifications and mild decrease in size since previous study. Current measurements are 2.0 x 1.7 cm on image 62/2, compared to 2.6 x 2.1 cm previously. Other tiny sub-cm mesenteric soft tissue nodules in the right lower quadrant and anterior pelvic mesentery are stable and could represent tiny peritoneal tumor implants or lymph nodes. Musculoskeletal:  No suspicious bone lesions identified. IMPRESSION: 1. Mild decrease in size of peritoneal tumor implant along the lateral aspect of the cecum. 2. Stable tiny sub-cm mesenteric soft tissue nodules or lymph nodes in right lower quadrant and anterior pelvic mesentery. 3. No new or progressive disease identified within the abdomen or pelvis. Electronically Signed   By: JMarlaine HindM.D.   On: 11/20/2019 10:27    All questions were answered. The patient knows to call the clinic with any problems, questions or concerns. No barriers to learning was detected.  I spent 25 minutes counseling the patient face to face. The total time spent in the appointment was 30 minutes and more than 50% was on counseling and review of test results  NHeath Lark MD 11/21/2019 1:14 PM

## 2019-11-21 NOTE — Patient Instructions (Signed)
[facility] Discharge Instructions for Patients Receiving Chemotherapy  Today you received the following chemotherapy agents: gemcitabine and carboplatin.  To help prevent nausea and vomiting after your treatment, we encourage you to take your nausea medication as directed.   If you develop nausea and vomiting that is not controlled by your nausea medication, call the clinic.   BELOW ARE SYMPTOMS THAT SHOULD BE REPORTED IMMEDIATELY:  *FEVER GREATER THAN 100.5 F  *CHILLS WITH OR WITHOUT FEVER  NAUSEA AND VOMITING THAT IS NOT CONTROLLED WITH YOUR NAUSEA MEDICATION  *UNUSUAL SHORTNESS OF BREATH  *UNUSUAL BRUISING OR BLEEDING  TENDERNESS IN MOUTH AND THROAT WITH OR WITHOUT PRESENCE OF ULCERS  *URINARY PROBLEMS  *BOWEL PROBLEMS  UNUSUAL RASH Items with * indicate a potential emergency and should be followed up as soon as possible.  Feel free to call the clinic should you have any questions or concerns. The clinic phone number is [phone].  Please show the CHEMO ALERT CARD at check-in to the Emergency Department and triage nurse.   

## 2019-11-21 NOTE — Assessment & Plan Note (Signed)
She have severe, persistent pancytopenia likely due to recent side effects of chemotherapy Her recent B12 and iron studies are adequate Her pancytopenia is due to treatment She is not symptomatic She does not need transfusion support

## 2019-11-21 NOTE — Assessment & Plan Note (Signed)
We discussed the importance of aggressive laxative therapy 

## 2019-11-21 NOTE — Patient Instructions (Signed)

## 2019-11-23 ENCOUNTER — Telehealth: Payer: Self-pay | Admitting: Hematology and Oncology

## 2019-11-23 NOTE — Telephone Encounter (Signed)
Scheduled appt per 12/14 sch message - pt to get an updated schedule next visit.

## 2019-11-28 ENCOUNTER — Other Ambulatory Visit: Payer: Self-pay

## 2019-11-28 ENCOUNTER — Inpatient Hospital Stay: Payer: Medicare Other

## 2019-11-28 VITALS — BP 134/75 | HR 66 | Temp 98.5°F | Resp 16

## 2019-11-28 DIAGNOSIS — Z7189 Other specified counseling: Secondary | ICD-10-CM

## 2019-11-28 DIAGNOSIS — C562 Malignant neoplasm of left ovary: Secondary | ICD-10-CM

## 2019-11-28 DIAGNOSIS — C561 Malignant neoplasm of right ovary: Secondary | ICD-10-CM

## 2019-11-28 DIAGNOSIS — C563 Malignant neoplasm of bilateral ovaries: Secondary | ICD-10-CM

## 2019-11-28 DIAGNOSIS — Z5111 Encounter for antineoplastic chemotherapy: Secondary | ICD-10-CM | POA: Diagnosis not present

## 2019-11-28 DIAGNOSIS — C800 Disseminated malignant neoplasm, unspecified: Secondary | ICD-10-CM

## 2019-11-28 DIAGNOSIS — Z95828 Presence of other vascular implants and grafts: Secondary | ICD-10-CM

## 2019-11-28 LAB — CBC WITH DIFFERENTIAL (CANCER CENTER ONLY)
Abs Immature Granulocytes: 0.01 10*3/uL (ref 0.00–0.07)
Basophils Absolute: 0 10*3/uL (ref 0.0–0.1)
Basophils Relative: 0 %
Eosinophils Absolute: 0 10*3/uL (ref 0.0–0.5)
Eosinophils Relative: 1 %
HCT: 30.7 % — ABNORMAL LOW (ref 36.0–46.0)
Hemoglobin: 10 g/dL — ABNORMAL LOW (ref 12.0–15.0)
Immature Granulocytes: 0 %
Lymphocytes Relative: 58 %
Lymphs Abs: 1.9 10*3/uL (ref 0.7–4.0)
MCH: 39.2 pg — ABNORMAL HIGH (ref 26.0–34.0)
MCHC: 32.6 g/dL (ref 30.0–36.0)
MCV: 120.4 fL — ABNORMAL HIGH (ref 80.0–100.0)
Monocytes Absolute: 0.3 10*3/uL (ref 0.1–1.0)
Monocytes Relative: 10 %
Neutro Abs: 1 10*3/uL — ABNORMAL LOW (ref 1.7–7.7)
Neutrophils Relative %: 31 %
Platelet Count: 362 10*3/uL (ref 150–400)
RBC: 2.55 MIL/uL — ABNORMAL LOW (ref 3.87–5.11)
RDW: 17 % — ABNORMAL HIGH (ref 11.5–15.5)
WBC Count: 3.3 10*3/uL — ABNORMAL LOW (ref 4.0–10.5)
nRBC: 1.2 % — ABNORMAL HIGH (ref 0.0–0.2)

## 2019-11-28 LAB — CMP (CANCER CENTER ONLY)
ALT: 23 U/L (ref 0–44)
AST: 20 U/L (ref 15–41)
Albumin: 3.5 g/dL (ref 3.5–5.0)
Alkaline Phosphatase: 101 U/L (ref 38–126)
Anion gap: 9 (ref 5–15)
BUN: 10 mg/dL (ref 8–23)
CO2: 27 mmol/L (ref 22–32)
Calcium: 9 mg/dL (ref 8.9–10.3)
Chloride: 107 mmol/L (ref 98–111)
Creatinine: 0.71 mg/dL (ref 0.44–1.00)
GFR, Est AFR Am: >60 mL/min (ref >60)
GFR, Estimated: >60 mL/min (ref >60)
Glucose, Bld: 102 mg/dL — ABNORMAL HIGH (ref 70–99)
Potassium: 3.7 mmol/L (ref 3.5–5.1)
Sodium: 143 mmol/L (ref 135–145)
Total Bilirubin: 0.4 mg/dL (ref 0.3–1.2)
Total Protein: 6.7 g/dL (ref 6.5–8.1)

## 2019-11-28 MED ORDER — SODIUM CHLORIDE 0.9% FLUSH
10.0000 mL | Freq: Once | INTRAVENOUS | Status: AC
  Administered 2019-11-28: 10 mL
  Filled 2019-11-28: qty 10

## 2019-11-28 MED ORDER — SODIUM CHLORIDE 0.9% FLUSH
10.0000 mL | INTRAVENOUS | Status: DC | PRN
  Administered 2019-11-28: 10 mL
  Filled 2019-11-28: qty 10

## 2019-11-28 MED ORDER — PROCHLORPERAZINE MALEATE 10 MG PO TABS
10.0000 mg | ORAL_TABLET | Freq: Once | ORAL | Status: AC
  Administered 2019-11-28: 10 mg via ORAL

## 2019-11-28 MED ORDER — SODIUM CHLORIDE 0.9 % IV SOLN
Freq: Once | INTRAVENOUS | Status: AC
  Filled 2019-11-28: qty 250

## 2019-11-28 MED ORDER — PROCHLORPERAZINE MALEATE 10 MG PO TABS
ORAL_TABLET | ORAL | Status: AC
  Filled 2019-11-28: qty 1

## 2019-11-28 MED ORDER — HEPARIN SOD (PORK) LOCK FLUSH 100 UNIT/ML IV SOLN
500.0000 [IU] | Freq: Once | INTRAVENOUS | Status: AC | PRN
  Administered 2019-11-28: 500 [IU]
  Filled 2019-11-28: qty 5

## 2019-11-28 MED ORDER — SODIUM CHLORIDE 0.9 % IV SOLN
600.0000 mg/m2 | Freq: Once | INTRAVENOUS | Status: AC
  Administered 2019-11-28: 1254 mg via INTRAVENOUS
  Filled 2019-11-28: qty 32.98

## 2019-11-28 NOTE — Patient Instructions (Signed)

## 2019-11-28 NOTE — Patient Instructions (Signed)
[facility] °Discharge Instructions for Patients Receiving Chemotherapy ° °Today you received the following chemotherapy agents Gemzar ° °To help prevent nausea and vomiting after your treatment, we encourage you to take your nausea medication as directed. °  °If you develop nausea and vomiting that is not controlled by your nausea medication, call the clinic.  ° °BELOW ARE SYMPTOMS THAT SHOULD BE REPORTED IMMEDIATELY: °· *FEVER GREATER THAN 100.5 F °· *CHILLS WITH OR WITHOUT FEVER °· NAUSEA AND VOMITING THAT IS NOT CONTROLLED WITH YOUR NAUSEA MEDICATION °· *UNUSUAL SHORTNESS OF BREATH °· *UNUSUAL BRUISING OR BLEEDING °· TENDERNESS IN MOUTH AND THROAT WITH OR WITHOUT PRESENCE OF ULCERS °· *URINARY PROBLEMS °· *BOWEL PROBLEMS °· UNUSUAL RASH °Items with * indicate a potential emergency and should be followed up as soon as possible. ° °Feel free to call the clinic should you have any questions or concerns. The clinic phone number is [phone]. ° °Please show the CHEMO ALERT CARD at check-in to the Emergency Department and triage nurse. ° ° °

## 2019-12-12 ENCOUNTER — Other Ambulatory Visit: Payer: Self-pay | Admitting: Hematology and Oncology

## 2019-12-12 ENCOUNTER — Inpatient Hospital Stay: Payer: Medicare (Managed Care)

## 2019-12-12 ENCOUNTER — Other Ambulatory Visit: Payer: Self-pay

## 2019-12-12 ENCOUNTER — Inpatient Hospital Stay: Payer: Medicare (Managed Care) | Attending: Hematology and Oncology

## 2019-12-12 VITALS — BP 125/65 | HR 65 | Temp 98.2°F | Resp 18

## 2019-12-12 DIAGNOSIS — Z7189 Other specified counseling: Secondary | ICD-10-CM

## 2019-12-12 DIAGNOSIS — Z5111 Encounter for antineoplastic chemotherapy: Secondary | ICD-10-CM | POA: Diagnosis not present

## 2019-12-12 DIAGNOSIS — Z79899 Other long term (current) drug therapy: Secondary | ICD-10-CM | POA: Diagnosis not present

## 2019-12-12 DIAGNOSIS — C562 Malignant neoplasm of left ovary: Secondary | ICD-10-CM

## 2019-12-12 DIAGNOSIS — C786 Secondary malignant neoplasm of retroperitoneum and peritoneum: Secondary | ICD-10-CM | POA: Insufficient documentation

## 2019-12-12 DIAGNOSIS — C563 Malignant neoplasm of bilateral ovaries: Secondary | ICD-10-CM

## 2019-12-12 DIAGNOSIS — Z8673 Personal history of transient ischemic attack (TIA), and cerebral infarction without residual deficits: Secondary | ICD-10-CM | POA: Diagnosis not present

## 2019-12-12 DIAGNOSIS — C800 Disseminated malignant neoplasm, unspecified: Secondary | ICD-10-CM

## 2019-12-12 DIAGNOSIS — Z7901 Long term (current) use of anticoagulants: Secondary | ICD-10-CM | POA: Insufficient documentation

## 2019-12-12 DIAGNOSIS — C561 Malignant neoplasm of right ovary: Secondary | ICD-10-CM | POA: Diagnosis not present

## 2019-12-12 DIAGNOSIS — D61818 Other pancytopenia: Secondary | ICD-10-CM | POA: Insufficient documentation

## 2019-12-12 DIAGNOSIS — R918 Other nonspecific abnormal finding of lung field: Secondary | ICD-10-CM | POA: Insufficient documentation

## 2019-12-12 DIAGNOSIS — T451X5A Adverse effect of antineoplastic and immunosuppressive drugs, initial encounter: Secondary | ICD-10-CM | POA: Diagnosis not present

## 2019-12-12 LAB — CBC WITH DIFFERENTIAL (CANCER CENTER ONLY)
Abs Immature Granulocytes: 0 10*3/uL (ref 0.00–0.07)
Basophils Absolute: 0 10*3/uL (ref 0.0–0.1)
Basophils Relative: 0 %
Eosinophils Absolute: 0 10*3/uL (ref 0.0–0.5)
Eosinophils Relative: 1 %
HCT: 28.4 % — ABNORMAL LOW (ref 36.0–46.0)
Hemoglobin: 9.6 g/dL — ABNORMAL LOW (ref 12.0–15.0)
Immature Granulocytes: 0 %
Lymphocytes Relative: 30 %
Lymphs Abs: 1.2 10*3/uL (ref 0.7–4.0)
MCH: 39.8 pg — ABNORMAL HIGH (ref 26.0–34.0)
MCHC: 33.8 g/dL (ref 30.0–36.0)
MCV: 117.8 fL — ABNORMAL HIGH (ref 80.0–100.0)
Monocytes Absolute: 0.7 10*3/uL (ref 0.1–1.0)
Monocytes Relative: 17 %
Neutro Abs: 2.1 10*3/uL (ref 1.7–7.7)
Neutrophils Relative %: 52 %
Platelet Count: 204 10*3/uL (ref 150–400)
RBC: 2.41 MIL/uL — ABNORMAL LOW (ref 3.87–5.11)
RDW: 17.6 % — ABNORMAL HIGH (ref 11.5–15.5)
WBC Count: 4 10*3/uL (ref 4.0–10.5)
nRBC: 0.5 % — ABNORMAL HIGH (ref 0.0–0.2)

## 2019-12-12 LAB — CMP (CANCER CENTER ONLY)
ALT: 15 U/L (ref 0–44)
AST: 16 U/L (ref 15–41)
Albumin: 3.5 g/dL (ref 3.5–5.0)
Alkaline Phosphatase: 97 U/L (ref 38–126)
Anion gap: 9 (ref 5–15)
BUN: 13 mg/dL (ref 8–23)
CO2: 27 mmol/L (ref 22–32)
Calcium: 8.9 mg/dL (ref 8.9–10.3)
Chloride: 109 mmol/L (ref 98–111)
Creatinine: 0.83 mg/dL (ref 0.44–1.00)
GFR, Est AFR Am: >60 mL/min (ref >60)
GFR, Estimated: >60 mL/min (ref >60)
Glucose, Bld: 98 mg/dL (ref 70–99)
Potassium: 3.8 mmol/L (ref 3.5–5.1)
Sodium: 145 mmol/L (ref 135–145)
Total Bilirubin: 0.5 mg/dL (ref 0.3–1.2)
Total Protein: 6.7 g/dL (ref 6.5–8.1)

## 2019-12-12 MED ORDER — PROCHLORPERAZINE MALEATE 10 MG PO TABS
ORAL_TABLET | ORAL | Status: AC
  Filled 2019-12-12: qty 1

## 2019-12-12 MED ORDER — SODIUM CHLORIDE 0.9 % IV SOLN
600.0000 mg/m2 | Freq: Once | INTRAVENOUS | Status: AC
  Administered 2019-12-12: 16:00:00 1254 mg via INTRAVENOUS
  Filled 2019-12-12: qty 32.98

## 2019-12-12 MED ORDER — SODIUM CHLORIDE 0.9 % IV SOLN
Freq: Once | INTRAVENOUS | Status: AC
  Filled 2019-12-12: qty 250

## 2019-12-12 MED ORDER — HEPARIN SOD (PORK) LOCK FLUSH 100 UNIT/ML IV SOLN
500.0000 [IU] | Freq: Once | INTRAVENOUS | Status: AC | PRN
  Administered 2019-12-12: 17:00:00 500 [IU]
  Filled 2019-12-12: qty 5

## 2019-12-12 MED ORDER — SODIUM CHLORIDE 0.9% FLUSH
10.0000 mL | INTRAVENOUS | Status: DC | PRN
  Administered 2019-12-12: 17:00:00 10 mL
  Filled 2019-12-12: qty 10

## 2019-12-12 MED ORDER — PROCHLORPERAZINE MALEATE 10 MG PO TABS
10.0000 mg | ORAL_TABLET | Freq: Once | ORAL | Status: AC
  Administered 2019-12-12: 16:00:00 10 mg via ORAL

## 2019-12-12 NOTE — Patient Instructions (Signed)
[facility] °Discharge Instructions for Patients Receiving Chemotherapy ° °Today you received the following chemotherapy agents Gemzar ° °To help prevent nausea and vomiting after your treatment, we encourage you to take your nausea medication as directed. °  °If you develop nausea and vomiting that is not controlled by your nausea medication, call the clinic.  ° °BELOW ARE SYMPTOMS THAT SHOULD BE REPORTED IMMEDIATELY: °· *FEVER GREATER THAN 100.5 F °· *CHILLS WITH OR WITHOUT FEVER °· NAUSEA AND VOMITING THAT IS NOT CONTROLLED WITH YOUR NAUSEA MEDICATION °· *UNUSUAL SHORTNESS OF BREATH °· *UNUSUAL BRUISING OR BLEEDING °· TENDERNESS IN MOUTH AND THROAT WITH OR WITHOUT PRESENCE OF ULCERS °· *URINARY PROBLEMS °· *BOWEL PROBLEMS °· UNUSUAL RASH °Items with * indicate a potential emergency and should be followed up as soon as possible. ° °Feel free to call the clinic should you have any questions or concerns. The clinic phone number is [phone]. ° °Please show the CHEMO ALERT CARD at check-in to the Emergency Department and triage nurse. ° ° °

## 2019-12-19 ENCOUNTER — Inpatient Hospital Stay: Payer: Medicare (Managed Care)

## 2019-12-19 ENCOUNTER — Inpatient Hospital Stay (HOSPITAL_BASED_OUTPATIENT_CLINIC_OR_DEPARTMENT_OTHER): Payer: Medicare (Managed Care) | Admitting: Hematology and Oncology

## 2019-12-19 ENCOUNTER — Telehealth: Payer: Self-pay | Admitting: Hematology and Oncology

## 2019-12-19 ENCOUNTER — Other Ambulatory Visit: Payer: Self-pay

## 2019-12-19 DIAGNOSIS — C563 Malignant neoplasm of bilateral ovaries: Secondary | ICD-10-CM

## 2019-12-19 DIAGNOSIS — C561 Malignant neoplasm of right ovary: Secondary | ICD-10-CM

## 2019-12-19 DIAGNOSIS — C562 Malignant neoplasm of left ovary: Secondary | ICD-10-CM

## 2019-12-19 DIAGNOSIS — Z7189 Other specified counseling: Secondary | ICD-10-CM

## 2019-12-19 DIAGNOSIS — C800 Disseminated malignant neoplasm, unspecified: Secondary | ICD-10-CM

## 2019-12-19 DIAGNOSIS — G620 Drug-induced polyneuropathy: Secondary | ICD-10-CM

## 2019-12-19 DIAGNOSIS — T451X5A Adverse effect of antineoplastic and immunosuppressive drugs, initial encounter: Secondary | ICD-10-CM

## 2019-12-19 DIAGNOSIS — D61818 Other pancytopenia: Secondary | ICD-10-CM | POA: Diagnosis not present

## 2019-12-19 DIAGNOSIS — G8191 Hemiplegia, unspecified affecting right dominant side: Secondary | ICD-10-CM

## 2019-12-19 DIAGNOSIS — Z95828 Presence of other vascular implants and grafts: Secondary | ICD-10-CM

## 2019-12-19 LAB — CMP (CANCER CENTER ONLY)
ALT: 31 U/L (ref 0–44)
AST: 36 U/L (ref 15–41)
Albumin: 3.5 g/dL (ref 3.5–5.0)
Alkaline Phosphatase: 100 U/L (ref 38–126)
Anion gap: 11 (ref 5–15)
BUN: 10 mg/dL (ref 8–23)
CO2: 25 mmol/L (ref 22–32)
Calcium: 8.7 mg/dL — ABNORMAL LOW (ref 8.9–10.3)
Chloride: 108 mmol/L (ref 98–111)
Creatinine: 0.75 mg/dL (ref 0.44–1.00)
GFR, Est AFR Am: >60 mL/min (ref >60)
GFR, Estimated: >60 mL/min (ref >60)
Glucose, Bld: 104 mg/dL — ABNORMAL HIGH (ref 70–99)
Potassium: 3.7 mmol/L (ref 3.5–5.1)
Sodium: 144 mmol/L (ref 135–145)
Total Bilirubin: 0.5 mg/dL (ref 0.3–1.2)
Total Protein: 6.8 g/dL (ref 6.5–8.1)

## 2019-12-19 LAB — CBC WITH DIFFERENTIAL (CANCER CENTER ONLY)
Abs Immature Granulocytes: 0.01 10*3/uL (ref 0.00–0.07)
Basophils Absolute: 0 10*3/uL (ref 0.0–0.1)
Basophils Relative: 1 %
Eosinophils Absolute: 0 10*3/uL (ref 0.0–0.5)
Eosinophils Relative: 0 %
HCT: 28.3 % — ABNORMAL LOW (ref 36.0–46.0)
Hemoglobin: 9.3 g/dL — ABNORMAL LOW (ref 12.0–15.0)
Immature Granulocytes: 0 %
Lymphocytes Relative: 57 %
Lymphs Abs: 1.5 10*3/uL (ref 0.7–4.0)
MCH: 38.8 pg — ABNORMAL HIGH (ref 26.0–34.0)
MCHC: 32.9 g/dL (ref 30.0–36.0)
MCV: 117.9 fL — ABNORMAL HIGH (ref 80.0–100.0)
Monocytes Absolute: 0.5 10*3/uL (ref 0.1–1.0)
Monocytes Relative: 17 %
Neutro Abs: 0.7 10*3/uL — ABNORMAL LOW (ref 1.7–7.7)
Neutrophils Relative %: 25 %
Platelet Count: 286 10*3/uL (ref 150–400)
RBC: 2.4 MIL/uL — ABNORMAL LOW (ref 3.87–5.11)
RDW: 16.9 % — ABNORMAL HIGH (ref 11.5–15.5)
WBC Count: 2.7 10*3/uL — ABNORMAL LOW (ref 4.0–10.5)
nRBC: 1.9 % — ABNORMAL HIGH (ref 0.0–0.2)

## 2019-12-19 MED ORDER — SODIUM CHLORIDE 0.9% FLUSH
10.0000 mL | INTRAVENOUS | Status: DC | PRN
  Administered 2019-12-19: 10 mL
  Filled 2019-12-19: qty 10

## 2019-12-19 MED ORDER — SODIUM CHLORIDE 0.9 % IV SOLN
Freq: Once | INTRAVENOUS | Status: AC
  Filled 2019-12-19: qty 250

## 2019-12-19 MED ORDER — SODIUM CHLORIDE 0.9 % IV SOLN
600.0000 mg/m2 | Freq: Once | INTRAVENOUS | Status: AC
  Administered 2019-12-19: 13:00:00 1254 mg via INTRAVENOUS
  Filled 2019-12-19: qty 32.98

## 2019-12-19 MED ORDER — PROCHLORPERAZINE MALEATE 10 MG PO TABS
10.0000 mg | ORAL_TABLET | Freq: Once | ORAL | Status: AC
  Administered 2019-12-19: 10 mg via ORAL

## 2019-12-19 MED ORDER — HEPARIN SOD (PORK) LOCK FLUSH 100 UNIT/ML IV SOLN
500.0000 [IU] | Freq: Once | INTRAVENOUS | Status: AC | PRN
  Administered 2019-12-19: 500 [IU]
  Filled 2019-12-19: qty 5

## 2019-12-19 MED ORDER — SODIUM CHLORIDE 0.9% FLUSH
10.0000 mL | Freq: Once | INTRAVENOUS | Status: AC
  Administered 2019-12-19: 10 mL
  Filled 2019-12-19: qty 10

## 2019-12-19 MED ORDER — PROCHLORPERAZINE MALEATE 10 MG PO TABS: 10.0000 mg | ORAL_TABLET | Freq: Once | ORAL | Status: DC

## 2019-12-19 MED ORDER — PROCHLORPERAZINE MALEATE 10 MG PO TABS
ORAL_TABLET | ORAL | Status: AC
  Filled 2019-12-19: qty 1

## 2019-12-19 MED ORDER — OXYCODONE HCL 5 MG PO TABS: 5.0000 mg | ORAL_TABLET | Freq: Four times a day (QID) | ORAL | 0 refills | Status: DC | PRN

## 2019-12-19 NOTE — Patient Instructions (Signed)
[facility] °Discharge Instructions for Patients Receiving Chemotherapy ° °Today you received the following chemotherapy agents Gemzar ° °To help prevent nausea and vomiting after your treatment, we encourage you to take your nausea medication as directed. °  °If you develop nausea and vomiting that is not controlled by your nausea medication, call the clinic.  ° °BELOW ARE SYMPTOMS THAT SHOULD BE REPORTED IMMEDIATELY: °· *FEVER GREATER THAN 100.5 F °· *CHILLS WITH OR WITHOUT FEVER °· NAUSEA AND VOMITING THAT IS NOT CONTROLLED WITH YOUR NAUSEA MEDICATION °· *UNUSUAL SHORTNESS OF BREATH °· *UNUSUAL BRUISING OR BLEEDING °· TENDERNESS IN MOUTH AND THROAT WITH OR WITHOUT PRESENCE OF ULCERS °· *URINARY PROBLEMS °· *BOWEL PROBLEMS °· UNUSUAL RASH °Items with * indicate a potential emergency and should be followed up as soon as possible. ° °Feel free to call the clinic should you have any questions or concerns. The clinic phone number is [phone]. ° °Please show the CHEMO ALERT CARD at check-in to the Emergency Department and triage nurse. ° ° °

## 2019-12-19 NOTE — Telephone Encounter (Signed)
Scheduled appt per 1/12 sch message - 1/26 appt already on schedule . Added additional appt . Pt to get an updated schedule in tx area

## 2019-12-19 NOTE — Patient Instructions (Signed)

## 2019-12-20 ENCOUNTER — Encounter: Payer: Self-pay | Admitting: Hematology and Oncology

## 2019-12-20 NOTE — Progress Notes (Signed)
[place] OFFICE PROGRESS NOTE  Patient Care Team: Helane Rima, MD as PCP - General (Family Medicine) Encarnacion Slates, MD as Referring Physician (Neurology) Cameron Sprang, MD as Consulting Physician (Neurology)  ASSESSMENT & PLAN:  Ovarian cancer [facility]) She tolerated treatment well overall She is noted to have pancytopenia consistently on day 8 of every cycle Due to the nature of her current treatment as maintenance treatment only, I recommend modifying her treatment schedule to every other week Meaning, she will get treatment on day 1 and day 15 and skip day 8 for cycle of every 28 days I will continue on current dose each time She agree with the plan of care  Pancytopenia, acquired [facility]) She has consistent pancytopenia with each cycle of treatment We will proceed with treatment today We discussed neutropenic precaution I recommend changing her treatment schedule as above and she agreed with the plan of care She does not need transfusion support  Right hemiparesis ([place]) Due to her history of stroke and significant expressive dysphagia It is very difficult to communicate her needs with the patient Her daughter will try to accompany her at every visit and we will try to accommodate her preference to get her appointments in the morning She is noted to have other risk factors for stroke and she would remain on anticoagulation therapy indefinitely  Chemotherapy-induced peripheral neuropathy ([place]) She has history of chronic neuropathy from prior treatment The patient has consistent pain and it is difficult for her to articulate her symptoms When she complained of leg pain, I felt that it is probably neuropathy that might be exacerbated by the cold weather I do not feel strongly she needs ultrasound venous Doppler to evaluate She is on chronic anticoagulation therapy and her symptoms and presentation is not consistent with DVT I recommend her to take prescribed pain  medicine   No orders of the defined types were placed in this encounter.   All questions were answered. The patient knows to call the clinic with any problems, questions or concerns. The total time spent in the appointment was 30 minutes encounter with patients including review of chart and various tests results, discussions about plan of care and coordination of care plan   Heath Lark, MD 12/20/2019 7:58 AM  INTERVAL HISTORY: Please see below for problem oriented charting. She returns with her daughter for further follow-up She has been complaining of left lower extremity pain When I asked her to describe the pain further, she is not able to use the correct words to describe it She has not been using her pain medicine consistently for pain Overall, she tolerated treatment well No recent infection, fever or chills No bleeding complications No changes in her appetite or bowel habits  SUMMARY OF ONCOLOGIC HISTORY: Oncology History Overview Note  Negative genetic testing Progressed on Niraparib   Carcinomatosis ([place])  12/06/2015 Initial Diagnosis   Carcinomatosis ([place])   Ovarian cancer, bilateral ([place])  12/24/2015 Initial Diagnosis   Ovarian cancer, bilateral ([place])   Ovarian cancer ([place])  11/28/2015 Imaging   Ct scan of abdomen: Peritoneal carcinomatosis and pelvic/inguinal adenopathy. Gynecologic primary is favored.   12/17/2015 Pathology Results   Lymph node, needle/core biopsy, L inguinal LAN - METASTATIC ADENOCARCINOMA. Microscopic Comment Immunohistochemistry will be performed and reported as an addendum. (JDP:ecj 12/18/2015) ADDENDUM: Immunohistochemistry shows strong positivity with cytokeratin 7, estrogen receptor (ER), progesterone receptor (PR), p53 and WT1. Negative markers are cytokeratin 20 , CDX- 2 and gross cystic disease fluid protein. The  morphology and immunophenotype are most consistent with a high grade gynecologic carcinoma including high grade ovarian  serous carcinoma. (JDP:kh 12-19-15   12/17/2015 Procedure   Technically successful ultrasound guided core left inguinal adenopathy biopsy   12/24/2015 Tumor Marker   Patient's tumor was tested for the following markers: CA-125 Results of the tumor marker test revealed 608.2   12/27/2015 - 02/28/2016 Chemotherapy   She received neoadjuvant chemo x 3 cycles   01/01/2016 Tumor Marker   Patient's tumor was tested for the following markers: CA-125 Results of the tumor marker test revealed 741.6   01/29/2016 Procedure   Successful placement of a right internal jugular approach power injectable Port-A-Cath. The catheter is ready for immediate use   01/29/2016 Imaging   US abdomen 1. Hyperechoic 2.5 x 1.0 x 1.6 cm lesion in the region the caudate lobe of the liver. Similar finding noted on prior CT of 11/28/2015. This could represent hemangioma. This could represent a malignancy including metastatic disease. 2. Exam otherwise unremarkable. No gallstones. No biliary distention.   02/03/2016 Tumor Marker   Patient's tumor was tested for the following markers: CA-125 Results of the tumor marker test revealed 137.1   02/20/2016 Imaging   MRI brain No acute infarct.  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  No intracranial mass.  Moderate small vessel disease changes.  Global atrophy without hydrocephalus.  Expanded partially empty sella without secondary findings of pseudotumor cerebri.  Minimal mucosal thickening ethmoid sinus air cells. Small air-fluid levels maxillary sinuses bilaterally may indicate changes of acute sinusitis.    02/28/2016 Tumor Marker   Patient's tumor was tested for the following markers: CA-125 Results of the tumor marker test revealed 33.6   03/02/2016 Imaging   CT chest, abdomen and pelvis 1. Today's study demonstrates a positive response to therapy with resolution of the previously noted malignant ascites, significant regression of  previously noted peritoneal implants, and regression of previously noted lymphadenopathy in the abdomen and pelvis. 2. No definite evidence to suggest metastatic disease to the thorax. 3. Stable 1.0 x 1.7 cm intermediate attenuation lesion associated with the posterior aspect of segment 1 of the liver, favored to represent a mildly proteinaceous hepatic cyst. The possibility of a peritoneal implant in this region is not entirely excluded, but is not favored on today's examination. 4. Extensive post infectious scarring inguinal upper right lung, likely sequela of prior necrotizing pneumonia. 5. Multiple tiny pulmonary nodules scattered throughout the lungs bilaterally all measuring 4 mm or less. These are nonspecific, but favored to be benign. Given the patient's history of primary gynecologic malignancy, attention on followup studies is recommended to ensure the stability of these findings. 6. Additional incidental findings, as above   03/14/2016 Imaging   CT angiogram 1. No pulmonary emboli. 2. Chronic changes to the right upper lobe. 3. Stable lesion in the caudate lobe of the liver. 4. Stable soft tissue prominence to the right of the trachea as described above. 5. Stable small nodule in the right lung.    03/24/2016 Pathology Results   1. Omentum, resection for tumor - FOCAL ADENOCARCINOMA ASSOCIATED WITH EXTENSIVE FIBROSIS, INFLAMMATION AND HEMOSIDERIN DEPOSITION. 2. Uterus +/- tubes/ovaries, neoplastic, cervix - CERVIX: SLIGHT CERVICITIS AND SQUAMOUS METAPLASIA. - ENDOMETRIUM: ATROPHIC, NO HYPERPLASIA OR MALIGNANCY. - MYOMETRIUM: LEIOMYOMATA WITH DEGENERATIVE CHANGES. NO MALIGNANCY. - UTERINE SEROSA: FOCAL ADENOCARCINOMA ASSOCIATED WITH ADHESIONS. - RIGHT OVARY: BENIGN CALCIFIED NODULE AND BENIGN SEROUS CYST. NO MALIGNANCY IDENTIFIED. - RIGHT FALLOPIAN TUBE: UNREMARKABLE. NO MALIGNANCY IDENTIFIED. - LEFT  OVARY: FOCAL ADENOCARCINOMA ASSOCIATED WITH EXTENSIVE FIBROSIS. - LEFT FALLOPIAN  TUBE: HYDROSALPINX. NO MALIGNANCY IDENTIFIED. 3. Lymph node, biopsy, right external iliac - METASTATIC ADENOCARCINOMA, 4. Soft tissue, biopsy, left para colic gutter - FOCAL ADENOCARCINOMA ASSOCIATED WITH FIBROSIS. Microscopic Comment 2. OVARY Specimen(s): Uterus with bilateral ovaries, omentum, right external iliac lymph node and left paracolic gutter. Procedure: (including lymph node sampling) Hysterectomy with bilateral salpingo-oophorectomy, omentectomy, lymph node biopsy and paracolic gutter biopsy. Primary tumor site (including laterality): Left ovary. Ovarian surface involvement: Yes Ovarian capsule intact without fragmentation: N/A Maximum tumor size (cm): 2 cm, 2 cm Histologic type: Serous carcinoma. Grade: 3 Peritoneal implants: (specify invasive or non-invasive): Left paracolic gutter positive for carcinoma Pelvic extension (list additional structures on separate lines and if involved): Omentum, right paracolic gutter and uterine serosa. Lymph nodes: number examined 1 ; number positive 1 TNM code: ypT3a, ypN1b, ypMX FIGO Stage (based on pathologic findings, needs clinical correlation): IIIA2 Comments: The left ovary has a 2 cm nodule, which is composed primarily of fibrous tissue with small foci of residual adenocarcinoma. There is also microscopic involvement of the uterine serosa, the left paracolic gutter biopsy and the omentum. The left paracolic gutter and omental involvement is microscopic and both are associated with extensive fibrosis with focal hemosiderin deposition. The right external iliac node is completely replaced by metastatic adenocarcinoma with serous features. (JDP:ecj 03/30/2016)   03/24/2016 Surgery   Operation: Robotic-assisted laparoscopic total hysterectomy with bilateral salpingoophorectomy, omentectomy, radical tumor debulking  Surgeon: Donaciano Eva  Operative Findings:  : small nodules in omentum, no ascites. Omental nodule (2cm) adherent to  lower anterior abdominal wall. 2cm left colonic gutter cystic nodule. 6cm right external iliac lymph node. Grossly normal ovaries and tubes. Fibroid uterus. No gross residual disease at completion of surgery representing R0 optimal/complete resection.    04/17/2016 Imaging   CT abdomen and pelvis 1. There is no evidence of acute inflammatory process within abdomen or pelvis. 2. Stable low-density lesion within caudate lobe of the liver. No new hepatic lesions are noted. 3. Again noted lobulated renal contour and multifocal renal cortical scarring. No hydronephrosis or hydroureter. 4. No significant mesenteric adenopathy. No retroperitoneal adenopathy. Stable minimal residual peritoneal thickening within pelvis. No new pelvic implants or pelvic ascites. 5. Status post hysterectomy.  Unremarkable urinary bladder. 6. Moderate stool within colon as described above. No evidence of colitis.    04/24/2016 - 06/19/2016 Chemotherapy   She received 3 more cycles of chemo after surgery   05/14/2016 Tumor Marker   Patient's tumor was tested for the following markers: CA-125 Results of the tumor marker test revealed 22.5   06/04/2016 Tumor Marker   Patient's tumor was tested for the following markers: CA-125 Results of the tumor marker test revealed 13.7   07/07/2016 Genetic Testing   Patient has genetic testing done for breast/ovasrian cancer panel Results revealed patient has no actionable mutation   07/23/2016 Imaging   Ct abdomen and pelvis 1. Heterogeneously enhancing 4.9 x 3.2 x 4.4 cm mass along the right pelvic sidewall may represent locally recurrent disease or malignant lymphadenopathy. 2. No other signs of definite metastatic disease noted elsewhere in the abdomen or pelvis. 3. Stable low-attenuation hepatic lesion in the caudate lobe of the liver, which appears to demonstrates some progressive centripetal filling on delayed images. This lesion is presumably benign given its stability  compared to prior studies, and is favored to represent a small cavernous hemangioma. 4. Cardiomegaly with biatrial dilatation, which is very severe on  the right side. 5. Aortic atherosclerosis. 6. Additional incidental findings, as above.   07/23/2016 Tumor Marker   Patient's tumor was tested for the following markers: CA-125 Results of the tumor marker test revealed 12.3   09/30/2016 Imaging   Ct abdomen and pelvis: 1. 4.9 cm heterogeneously enhancing mass seen along the right pelvic sidewall previously has almost completely resolved. There is some ill-defined soft tissue attenuation/peritoneal thickening in this region today but no discrete measurable lesion is evident. 2. No new or progressive findings on today's exam. 3. Stable hypo attenuating lesion in the caudate lobe of the liver. 4. Subtle aortic atherosclerosis   09/30/2016 Tumor Marker   Patient's tumor was tested for the following markers: CA-125 Results of the tumor marker test revealed 9.6   10/05/2016 Pathology Results   Vagina, biopsy, left cuff - BENIGN FIBROEPITHELIAL (STROMAL) POLYP. - NO DYSPLASIA, ATYPIA OR MALIGNANCY IDENTIFIED.   01/14/2017 Tumor Marker   Patient's tumor was tested for the following markers: CA-125 Results of the tumor marker test revealed 11.9   05/05/2017 Tumor Marker   Patient's tumor was tested for the following markers: CA-125 Results of the tumor marker test revealed 28   06/14/2017 Tumor Marker   Patient's tumor was tested for the following markers: CA-125 Results of the tumor marker test revealed 45.7   06/24/2017 Imaging   Ct abdomen and pelvis: Increased peritoneal metastatic disease in abdomen and pelvis since prior exam. No evidence of ascites. Stable small benign hepatic hemangioma.   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 Does of chemo is reduced starting cycle 2 onwards   09/02/2017 Tumor Marker   Patient's tumor was tested for the following markers: CA-125 Results of the tumor marker test revealed 27.7   09/16/2017 Imaging   CT CHEST IMPRESSION  1. No acute process or evidence of metastatic disease in the chest. 2. Right upper lung bronchiectasis, consolidation, and architectural distortion are most likely post infectious or inflammatory and not significantly changed. 3. Aortic Atherosclerosis (ICD10-I70.0).  CT ABDOMEN AND PELVIS IMPRESSION  1. Response to therapy of nodal and peritoneal metastasis. 2. No new or progressive disease. 3. Caudate lobe hemangioma, as before. 4. Bilateral renal cortical thinning. Similar minimal right-sided caliectasis and hydroureter. Likely secondary to low-grade obstruction by the dominant right pelvic implant.   09/22/2017 Tumor Marker   Patient's tumor was tested for the following markers: CA-125 Results of the tumor marker test revealed 18   11/03/2017 Tumor Marker   Patient's tumor was tested for the following markers: CA-125 Results of the tumor marker test revealed 13.4   11/25/2017 Imaging   1. Stable to slight interval decrease in size of nodal and peritoneal metastatic disease within the abdomen. 2. No new or progressive disease.   12/03/2017 - 09/11/2019 Chemotherapy   The patient is prescribed [place]   01/17/2018 Tumor Marker   Patient's tumor was tested for the following markers: CA-125 Results of the tumor marker test revealed 11.9   02/22/2018 Tumor Marker   Patient's tumor was tested for the following markers: CA-125 Results of the tumor marker test revealed 11.7   02/22/2018 Imaging   1. Improved nodular thickening along the vaginal cuff on the left and medial to the right iliac vessels, likely  treated implants. No evidence of progressive  peritoneal disease. 2. Stable hepatic hemangioma. No evidence of solid visceral organ metastases. 3. Bilateral renal cortical scarring.   05/19/2018 Tumor Marker   Patient's tumor was tested for the following markers: CA-125 Results of the tumor marker test revealed 9.9   08/10/2018 Imaging   Stable mild nodular thickening of the left vaginal cuff. No new or progressive disease within the abdomen or pelvis.  Stable small hepatic hemangioma.   09/21/2018 Tumor Marker   Patient's tumor was tested for the following markers: CA-125 Results of the tumor marker test revealed 10.3   11/11/2018 Tumor Marker   Patient's tumor was tested for the following markers: CA-125 Results of the tumor marker test revealed 10.5   12/15/2018 Tumor Marker   Patient's tumor was tested for the following markers: CA-125 Results of the tumor marker test revealed 12.6   01/06/2019 Tumor Marker   Patient's tumor was tested for the following markers: CA-125 Results of the tumor marker test revealed 14.6   01/12/2019 Imaging   1. Chronic extensive right upper lobe scarring changes and bronchiectasis. 2. No mediastinal or hilar mass or adenopathy and no findings for pulmonary metastatic disease. 3. Stable caudate lobe hemangioma. 4. No worrisome omental or peritoneal surface lesions. 5. Stable pericecal and small pelvic lymph nodes. No new or progressive findings.    09/11/2019 Imaging   Ct abdomen and pelvis Pericecal peritoneal metastatic implants   09/19/2019 -  Chemotherapy   The patient had carboplatin and gemxar for chemotherapy treatment.     11/20/2019 Imaging   1. Mild decrease in size of peritoneal tumor implant along the lateral aspect of the cecum. 2. Stable tiny sub-cm mesenteric soft tissue nodules or lymph nodes in right lower quadrant and anterior pelvic mesentery. 3. No new or progressive disease identified within the abdomen or pelvis.     REVIEW OF SYSTEMS:   Constitutional: Denies  fevers, chills or abnormal weight loss Eyes: Denies blurriness of vision Ears, nose, mouth, throat, and face: Denies mucositis or sore throat Respiratory: Denies cough, dyspnea or wheezes Cardiovascular: Denies palpitation, chest discomfort or lower extremity swelling Gastrointestinal:  Denies nausea, heartburn or change in bowel habits Skin: Denies abnormal skin rashes Lymphatics: Denies new lymphadenopathy or easy bruising Neurological:Denies numbness, tingling or new weaknesses Behavioral/Psych: Mood is stable, no new changes  All other systems were reviewed with the patient and are negative.  I have reviewed the past medical history, past surgical history, social history and family history with the patient and they are unchanged from previous note.  ALLERGIES:  is allergic to codeine; lactulose; and latex.  MEDICATIONS:  Current Outpatient Medications  Medication Sig Dispense Refill  . apixaban (ELIQUIS) 5 MG TABS tablet Take 1 tablet (5 mg total) by mouth 2 (two) times daily. 60 tablet 5  . atorvastatin (LIPITOR) 40 MG tablet Take 40 mg by mouth at bedtime.    . diphenhydrAMINE (BENADRYL) 25 MG tablet Take 25 mg by mouth every 6 (six) hours as needed for allergies.    . fluticasone (FLONASE) 50 MCG/ACT nasal spray Place 2 sprays into both nostrils 2 (two) times daily.    Marland Kitchen gabapentin (NEURONTIN) 600 MG tablet Take 1 tablet (600 mg total) by mouth 2 (two) times daily. 60 tablet 11  . guaifenesin (ROBITUSSIN) 100 MG/5ML syrup Take 200 mg by mouth 3 (three) times daily as needed for cough.    . lidocaine-prilocaine (EMLA) cream Apply to Porta-Cath 1-2 hours prior to access  as directed. (Patient not taking: Reported on 03/28/2019) 30 g 1  . metoprolol succinate (TOPROL-XL) 25 MG 24 hr tablet Take 25 mg by mouth daily.   5  . Multiple Vitamin (MULTIVITAMIN WITH MINERALS) TABS tablet Take 1 tablet by mouth daily.    Marland Kitchen omega-3 acid ethyl esters (LOVAZA) 1 g capsule Take 1 g by mouth daily.     . ondansetron (ZOFRAN ODT) 4 MG disintegrating tablet Take 1 tablet (4 mg total) by mouth every 8 (eight) hours as needed for nausea or vomiting. 60 tablet 9  . oxyCODONE (OXY IR/ROXICODONE) 5 MG immediate release tablet Take 1 tablet (5 mg total) by mouth every 6 (six) hours as needed for severe pain. 60 tablet 0  . PARoxetine (PAXIL) 40 MG tablet Take 40 mg by mouth daily.  0  . polyethylene glycol (MIRALAX / GLYCOLAX) packet Take 17 g by mouth daily.    . verapamil (CALAN-SR) 240 MG CR tablet Take 1 tablet (240 mg total) by mouth daily. 30 tablet 0   No current facility-administered medications for this visit.    PHYSICAL EXAMINATION: ECOG PERFORMANCE STATUS: 2 - Symptomatic, <50% confined to bed  Vitals:   12/19/19 1020  BP: 120/65  Pulse: 60  Resp: 18  Temp: 98.7 F (37.1 C)  SpO2: 100%   Filed Weights   12/19/19 1020  Weight: 203 lb 12.8 oz (92.4 kg)    GENERAL:alert, no distress and comfortable SKIN: skin color, texture, turgor are normal, no rashes or significant lesions EYES: normal, Conjunctiva are pink and non-injected, sclera clear OROPHARYNX:no exudate, no erythema and lips, buccal mucosa, and tongue normal  NECK: supple, thyroid normal size, non-tender, without nodularity LYMPH:  no palpable lymphadenopathy in the cervical, axillary or inguinal LUNGS: clear to auscultation and percussion with normal breathing effort HEART: regular rate & rhythm and no murmurs and no lower extremity edema ABDOMEN:abdomen soft, non-tender and normal bowel sounds Musculoskeletal:no cyanosis of digits and no clubbing  NEURO: alert & oriented x 3 with persistent dysarthria and hemiparesis from stroke  LABORATORY DATA:  I have reviewed the data as listed    Component Value Date/Time   NA 144 12/19/2019 1015   NA 142 12/08/2017 1051   K 3.7 12/19/2019 1015   K 4.4 12/08/2017 1051   CL 108 12/19/2019 1015   CL 105 06/14/2017 1430   CO2 25 12/19/2019 1015   CO2 28 12/08/2017  1051   GLUCOSE 104 (H) 12/19/2019 1015   GLUCOSE 97 12/08/2017 1051   BUN 10 12/19/2019 1015   BUN 16.2 12/08/2017 1051   CREATININE 0.75 12/19/2019 1015   CREATININE 0.9 12/08/2017 1051   CALCIUM 8.7 (L) 12/19/2019 1015   CALCIUM 9.5 12/08/2017 1051   PROT 6.8 12/19/2019 1015   PROT 6.9 12/08/2017 1051   ALBUMIN 3.5 12/19/2019 1015   ALBUMIN 3.6 12/08/2017 1051   AST 36 12/19/2019 1015   AST 18 12/08/2017 1051   ALT 31 12/19/2019 1015   ALT 20 12/08/2017 1051   ALKPHOS 100 12/19/2019 1015   ALKPHOS 131 12/08/2017 1051   BILITOT 0.5 12/19/2019 1015   BILITOT 0.70 12/08/2017 1051   GFRNONAA >60 12/19/2019 1015   GFRAA >60 12/19/2019 1015    No results found for: SPEP, UPEP  Lab Results  Component Value Date   WBC 2.7 (L) 12/19/2019   NEUTROABS 0.7 (L) 12/19/2019   HGB 9.3 (L) 12/19/2019   HCT 28.3 (L) 12/19/2019   MCV 117.9 (H) 12/19/2019  PLT 286 12/19/2019      Chemistry      Component Value Date/Time   NA 144 12/19/2019 1015   NA 142 12/08/2017 1051   K 3.7 12/19/2019 1015   K 4.4 12/08/2017 1051   CL 108 12/19/2019 1015   CL 105 06/14/2017 1430   CO2 25 12/19/2019 1015   CO2 28 12/08/2017 1051   BUN 10 12/19/2019 1015   BUN 16.2 12/08/2017 1051   CREATININE 0.75 12/19/2019 1015   CREATININE 0.9 12/08/2017 1051      Component Value Date/Time   CALCIUM 8.7 (L) 12/19/2019 1015   CALCIUM 9.5 12/08/2017 1051   ALKPHOS 100 12/19/2019 1015   ALKPHOS 131 12/08/2017 1051   AST 36 12/19/2019 1015   AST 18 12/08/2017 1051   ALT 31 12/19/2019 1015   ALT 20 12/08/2017 1051   BILITOT 0.5 12/19/2019 1015   BILITOT 0.70 12/08/2017 1051       RADIOGRAPHIC STUDIES: I have personally reviewed the radiological images as listed and agreed with the findings in the report. CT Abdomen Pelvis W Contrast  Result Date: 11/20/2019 CLINICAL DATA:  Follow-up metastatic left ovarian carcinoma. Ongoing chemotherapy. EXAM: CT ABDOMEN AND PELVIS WITH CONTRAST TECHNIQUE:  Multidetector CT imaging of the abdomen and pelvis was performed using the standard protocol following bolus administration of intravenous contrast. CONTRAST:  147m OMNIPAQUE IOHEXOL 300 MG/ML  SOLN COMPARISON:  09/11/2019 FINDINGS: Lower Chest: No acute findings. Hepatobiliary: Stable small approximately 2 cm benign hemangioma in the caudate lobe. No other liver masses identified. Gallbladder is unremarkable. No evidence of biliary ductal dilatation. Pancreas:  No mass or inflammatory changes. Spleen: Within normal limits in size and appearance. Adrenals/Urinary Tract: No masses identified. Mild-to-moderate bilateral renal parenchymal scarring. Tiny right renal cyst. No evidence of hydronephrosis. Stomach/Bowel: No evidence of obstruction, inflammatory process or abnormal fluid collections. Vascular/Lymphatic:  No significant abnormality. Reproductive: Prior hysterectomy. No evidence of adnexal mass or ascites. Other: Soft tissue nodule along the lateral aspect of the cecum shows increased calcifications and mild decrease in size since previous study. Current measurements are 2.0 x 1.7 cm on image 62/2, compared to 2.6 x 2.1 cm previously. Other tiny sub-cm mesenteric soft tissue nodules in the right lower quadrant and anterior pelvic mesentery are stable and could represent tiny peritoneal tumor implants or lymph nodes. Musculoskeletal:  No suspicious bone lesions identified. IMPRESSION: 1. Mild decrease in size of peritoneal tumor implant along the lateral aspect of the cecum. 2. Stable tiny sub-cm mesenteric soft tissue nodules or lymph nodes in right lower quadrant and anterior pelvic mesentery. 3. No new or progressive disease identified within the abdomen or pelvis. Electronically Signed   By: JMarlaine HindM.D.   On: 11/20/2019 10:27

## 2019-12-20 NOTE — Assessment & Plan Note (Signed)
Due to her history of stroke and significant expressive dysphagia It is very difficult to communicate her needs with the patient Her daughter will try to accompany her at every visit and we will try to accommodate her preference to get her appointments in the morning She is noted to have other risk factors for stroke and she would remain on anticoagulation therapy indefinitely

## 2019-12-20 NOTE — Assessment & Plan Note (Signed)
She tolerated treatment well overall She is noted to have pancytopenia consistently on day 8 of every cycle Due to the nature of her current treatment as maintenance treatment only, I recommend modifying her treatment schedule to every other week Meaning, she will get treatment on day 1 and day 15 and skip day 8 for cycle of every 28 days I will continue on current dose each time She agree with the plan of care

## 2019-12-20 NOTE — Assessment & Plan Note (Signed)
She has consistent pancytopenia with each cycle of treatment We will proceed with treatment today We discussed neutropenic precaution I recommend changing her treatment schedule as above and she agreed with the plan of care She does not need transfusion support

## 2019-12-20 NOTE — Assessment & Plan Note (Signed)
She has history of chronic neuropathy from prior treatment The patient has consistent pain and it is difficult for her to articulate her symptoms When she complained of leg pain, I felt that it is probably neuropathy that might be exacerbated by the cold weather I do not feel strongly she needs ultrasound venous Doppler to evaluate She is on chronic anticoagulation therapy and her symptoms and presentation is not consistent with DVT I recommend her to take prescribed pain medicine

## 2020-01-02 ENCOUNTER — Inpatient Hospital Stay: Payer: Medicare (Managed Care)

## 2020-01-02 ENCOUNTER — Encounter (HOSPITAL_COMMUNITY): Payer: Self-pay

## 2020-01-02 ENCOUNTER — Other Ambulatory Visit: Payer: Self-pay

## 2020-01-02 ENCOUNTER — Emergency Department (HOSPITAL_COMMUNITY)
Admission: EM | Admit: 2020-01-02 | Discharge: 2020-01-03 | Disposition: A | Discharge status: Home / Self Care | Payer: Medicare (Managed Care) | Source: Home / Self Care | Attending: Emergency Medicine | Admitting: Emergency Medicine

## 2020-01-02 VITALS — BP 144/84 | HR 79 | Temp 98.5°F | Resp 18 | Wt 205.0 lb

## 2020-01-02 DIAGNOSIS — Z7189 Other specified counseling: Secondary | ICD-10-CM

## 2020-01-02 DIAGNOSIS — I10 Essential (primary) hypertension: Secondary | ICD-10-CM | POA: Insufficient documentation

## 2020-01-02 DIAGNOSIS — C561 Malignant neoplasm of right ovary: Secondary | ICD-10-CM

## 2020-01-02 DIAGNOSIS — C562 Malignant neoplasm of left ovary: Secondary | ICD-10-CM

## 2020-01-02 DIAGNOSIS — C800 Disseminated malignant neoplasm, unspecified: Secondary | ICD-10-CM

## 2020-01-02 DIAGNOSIS — M79601 Pain in right arm: Secondary | ICD-10-CM | POA: Insufficient documentation

## 2020-01-02 DIAGNOSIS — D539 Nutritional anemia, unspecified: Secondary | ICD-10-CM

## 2020-01-02 DIAGNOSIS — I69951 Hemiplegia and hemiparesis following unspecified cerebrovascular disease affecting right dominant side: Secondary | ICD-10-CM | POA: Insufficient documentation

## 2020-01-02 DIAGNOSIS — C563 Malignant neoplasm of bilateral ovaries: Secondary | ICD-10-CM

## 2020-01-02 DIAGNOSIS — Z91040 Latex allergy status: Secondary | ICD-10-CM | POA: Insufficient documentation

## 2020-01-02 DIAGNOSIS — Z79899 Other long term (current) drug therapy: Secondary | ICD-10-CM | POA: Insufficient documentation

## 2020-01-02 DIAGNOSIS — Z95828 Presence of other vascular implants and grafts: Secondary | ICD-10-CM

## 2020-01-02 DIAGNOSIS — Z7901 Long term (current) use of anticoagulants: Secondary | ICD-10-CM | POA: Insufficient documentation

## 2020-01-02 DIAGNOSIS — D520 Dietary folate deficiency anemia: Secondary | ICD-10-CM | POA: Insufficient documentation

## 2020-01-02 LAB — CBC WITH DIFFERENTIAL/PLATELET
Abs Immature Granulocytes: 0.01 10*3/uL (ref 0.00–0.07)
Basophils Absolute: 0 10*3/uL (ref 0.0–0.1)
Basophils Relative: 0 %
Eosinophils Absolute: 0 10*3/uL (ref 0.0–0.5)
Eosinophils Relative: 1 %
HCT: 32.9 % — ABNORMAL LOW (ref 36.0–46.0)
Hemoglobin: 10.7 g/dL — ABNORMAL LOW (ref 12.0–15.0)
Immature Granulocytes: 0 %
Lymphocytes Relative: 27 %
Lymphs Abs: 1.3 10*3/uL (ref 0.7–4.0)
MCH: 39.5 pg — ABNORMAL HIGH (ref 26.0–34.0)
MCHC: 32.5 g/dL (ref 30.0–36.0)
MCV: 121.4 fL — ABNORMAL HIGH (ref 80.0–100.0)
Monocytes Absolute: 0.7 10*3/uL (ref 0.1–1.0)
Monocytes Relative: 14 %
Neutro Abs: 2.9 10*3/uL (ref 1.7–7.7)
Neutrophils Relative %: 58 %
Platelets: 293 10*3/uL (ref 150–400)
RBC: 2.71 MIL/uL — ABNORMAL LOW (ref 3.87–5.11)
RDW: 16.7 % — ABNORMAL HIGH (ref 11.5–15.5)
WBC: 5 10*3/uL (ref 4.0–10.5)
nRBC: 0 % (ref 0.0–0.2)

## 2020-01-02 LAB — CBC WITH DIFFERENTIAL (CANCER CENTER ONLY)
Abs Immature Granulocytes: 0.01 10*3/uL (ref 0.00–0.07)
Basophils Absolute: 0 10*3/uL (ref 0.0–0.1)
Basophils Relative: 0 %
Eosinophils Absolute: 0 10*3/uL (ref 0.0–0.5)
Eosinophils Relative: 0 %
HCT: 29.9 % — ABNORMAL LOW (ref 36.0–46.0)
Hemoglobin: 10 g/dL — ABNORMAL LOW (ref 12.0–15.0)
Immature Granulocytes: 0 %
Lymphocytes Relative: 18 %
Lymphs Abs: 1.1 10*3/uL (ref 0.7–4.0)
MCH: 39.1 pg — ABNORMAL HIGH (ref 26.0–34.0)
MCHC: 33.4 g/dL (ref 30.0–36.0)
MCV: 116.8 fL — ABNORMAL HIGH (ref 80.0–100.0)
Monocytes Absolute: 0.8 10*3/uL (ref 0.1–1.0)
Monocytes Relative: 13 %
Neutro Abs: 4.3 10*3/uL (ref 1.7–7.7)
Neutrophils Relative %: 69 %
Platelet Count: 265 10*3/uL (ref 150–400)
RBC: 2.56 MIL/uL — ABNORMAL LOW (ref 3.87–5.11)
RDW: 16.5 % — ABNORMAL HIGH (ref 11.5–15.5)
WBC Count: 6.2 10*3/uL (ref 4.0–10.5)
nRBC: 0 % (ref 0.0–0.2)

## 2020-01-02 LAB — CMP (CANCER CENTER ONLY)
ALT: 16 U/L (ref 0–44)
AST: 18 U/L (ref 15–41)
Albumin: 3.6 g/dL (ref 3.5–5.0)
Alkaline Phosphatase: 99 U/L (ref 38–126)
Anion gap: 8 (ref 5–15)
BUN: 12 mg/dL (ref 8–23)
CO2: 27 mmol/L (ref 22–32)
Calcium: 9 mg/dL (ref 8.9–10.3)
Chloride: 105 mmol/L (ref 98–111)
Creatinine: 0.74 mg/dL (ref 0.44–1.00)
GFR, Est AFR Am: >60 mL/min (ref >60)
GFR, Estimated: >60 mL/min (ref >60)
Glucose, Bld: 101 mg/dL — ABNORMAL HIGH (ref 70–99)
Potassium: 3.8 mmol/L (ref 3.5–5.1)
Sodium: 140 mmol/L (ref 135–145)
Total Bilirubin: 0.5 mg/dL (ref 0.3–1.2)
Total Protein: 6.8 g/dL (ref 6.5–8.1)

## 2020-01-02 LAB — COMPREHENSIVE METABOLIC PANEL
ALT: 19 U/L (ref 0–44)
AST: 22 U/L (ref 15–41)
Albumin: 3.8 g/dL (ref 3.5–5.0)
Alkaline Phosphatase: 89 U/L (ref 38–126)
Anion gap: 8 (ref 5–15)
BUN: 12 mg/dL (ref 8–23)
CO2: 28 mmol/L (ref 22–32)
Calcium: 9.4 mg/dL (ref 8.9–10.3)
Chloride: 107 mmol/L (ref 98–111)
Creatinine, Ser: 0.69 mg/dL (ref 0.44–1.00)
GFR calc Af Amer: >60 mL/min (ref >60)
GFR calc non Af Amer: >60 mL/min (ref >60)
Glucose, Bld: 116 mg/dL — ABNORMAL HIGH (ref 70–99)
Potassium: 3.7 mmol/L (ref 3.5–5.1)
Sodium: 143 mmol/L (ref 135–145)
Total Bilirubin: 0.8 mg/dL (ref 0.3–1.2)
Total Protein: 7.2 g/dL (ref 6.5–8.1)

## 2020-01-02 MED ORDER — PROCHLORPERAZINE MALEATE 10 MG PO TABS
10.0000 mg | ORAL_TABLET | Freq: Once | ORAL | Status: AC
  Administered 2020-01-02: 15:00:00 10 mg via ORAL

## 2020-01-02 MED ORDER — SODIUM CHLORIDE 0.9% FLUSH
10.0000 mL | Freq: Once | INTRAVENOUS | Status: AC
  Administered 2020-01-02: 10 mL
  Filled 2020-01-02: qty 10

## 2020-01-02 MED ORDER — SODIUM CHLORIDE 0.9 % IV SOLN
600.0000 mg/m2 | Freq: Once | INTRAVENOUS | Status: AC
  Administered 2020-01-02: 1254 mg via INTRAVENOUS
  Filled 2020-01-02: qty 32.98

## 2020-01-02 MED ORDER — SODIUM CHLORIDE 0.9 % IV SOLN
Freq: Once | INTRAVENOUS | Status: AC
  Filled 2020-01-02: qty 250

## 2020-01-02 MED ORDER — PROCHLORPERAZINE MALEATE 10 MG PO TABS
ORAL_TABLET | ORAL | Status: AC
  Filled 2020-01-02: qty 1

## 2020-01-02 MED ORDER — SODIUM CHLORIDE 0.9% FLUSH: 3.0000 mL | Freq: Once | INTRAVENOUS | Status: DC

## 2020-01-02 MED ORDER — HEPARIN SOD (PORK) LOCK FLUSH 100 UNIT/ML IV SOLN
500.0000 [IU] | Freq: Once | INTRAVENOUS | Status: AC | PRN
  Administered 2020-01-02: 500 [IU]
  Filled 2020-01-02: qty 5

## 2020-01-02 MED ORDER — SODIUM CHLORIDE 0.9% FLUSH
10.0000 mL | INTRAVENOUS | Status: DC | PRN
  Administered 2020-01-02: 16:00:00 10 mL
  Filled 2020-01-02: qty 10

## 2020-01-02 NOTE — ED Triage Notes (Addendum)
Patient arrived stating that she began having right arm pain from her elbow to wrist. States that she received chemo today and her arm is warm. Sent here to rule out infection. Patient declines any pain now.

## 2020-01-02 NOTE — Patient Instructions (Signed)
[facility] °Discharge Instructions for Patients Receiving Chemotherapy ° °Today you received the following chemotherapy agents Gemzar ° °To help prevent nausea and vomiting after your treatment, we encourage you to take your nausea medication as directed. °  °If you develop nausea and vomiting that is not controlled by your nausea medication, call the clinic.  ° °BELOW ARE SYMPTOMS THAT SHOULD BE REPORTED IMMEDIATELY: °· *FEVER GREATER THAN 100.5 F °· *CHILLS WITH OR WITHOUT FEVER °· NAUSEA AND VOMITING THAT IS NOT CONTROLLED WITH YOUR NAUSEA MEDICATION °· *UNUSUAL SHORTNESS OF BREATH °· *UNUSUAL BRUISING OR BLEEDING °· TENDERNESS IN MOUTH AND THROAT WITH OR WITHOUT PRESENCE OF ULCERS °· *URINARY PROBLEMS °· *BOWEL PROBLEMS °· UNUSUAL RASH °Items with * indicate a potential emergency and should be followed up as soon as possible. ° °Feel free to call the clinic should you have any questions or concerns. The clinic phone number is [phone]. ° °Please show the CHEMO ALERT CARD at check-in to the Emergency Department and triage nurse. ° ° °

## 2020-01-02 NOTE — ED Provider Notes (Signed)
[facility] DEPT Provider Note   CSN: [id number] Arrival date & time: 01/02/20  2210   History Chief Complaint  Patient presents with   Arm Pain    Brenda Cunningham is a 69 y.o. female.  The history is provided by the patient.  Arm Pain  She has history of hypertension, hyperlipidemia, atrial fibrillation, ovarian cancer and received an infusion of gemcitabine earlier today.  Following the infusion, she noted that her right arm was painful and her daughter noted that it was warmer than it usually is.  That arm is weak from a stroke affect.  She is anticoagulated on apixaban because of her atrial fibrillation.  Daughter states that the arm has been cold since she had the stroke and that is very unusual for her to be warm to the touch, so she came here because of concern of infection.  There has been no known fever or chills or sweats.  Patient states that her arm is not hurting now.  Past Medical History:  Diagnosis Date   A-fib [facility])    Anxiety    Arthritis    Breast cancer ([place]) 2008   s/p lumpectomy, chemo and radiation therapy   Complication of anesthesia    DIFFICULTY WAKING UP FROM ANESTHESIA   Hyperlipidemia    Hypertension    Ovarian ca ([place]) dx'd 2018   Personal history of chemotherapy    Personal history of radiation therapy    Stroke ([place]) 07/29/2015   Wears glasses     Patient Active Problem List   Diagnosis Date Noted   Preventive measure 09/05/2019   Esophageal web determined by endoscopy 01/13/2019   Hyperglycemia 01/13/2019   Abdominal pain 01/06/2019   Pancytopenia, acquired ([place]) 12/15/2018   Acute URI 12/15/2018   Deficiency anemia 05/19/2018   Hemiparesis affecting right side as late effect of cerebrovascular accident ([place]) 04/04/2018   Cataract 03/14/2018   Hypertensive retinopathy 03/14/2018   Other constipation 01/17/2018   Anemia, chronic disease 11/25/2017   Goals of care,  counseling/discussion 07/15/2017   Hyperlipidemia 12/17/2016   Genetic testing 08/17/2016   Leukopenia due to antineoplastic chemotherapy ([place]) 06/05/2016   Costochondritis, acute 05/02/2016   Recurrent major depressive disorder, in partial remission ([place]) 04/28/2016   Ovarian cancer ([place]) 03/24/2016   Hx of ovarian cancer 03/24/2016   Anemia due to antineoplastic chemotherapy 03/17/2016   Port catheter in place 03/17/2016   Portacath in place 02/05/2016   Chemotherapy-induced peripheral neuropathy ([place]) 02/05/2016   Hematoma 01/27/2016   Chronic anticoagulation 01/22/2016   PICC (peripherally inserted central catheter) in place 01/22/2016   Chemotherapy induced neutropenia ([place]) 01/22/2016   Poor venous access 01/22/2016   CVA (cerebral vascular accident) ([place]) 12/27/2015   Right hemiparesis ([place]) 12/27/2015   Expressive dysphasia 12/27/2015   History of left breast cancer 12/27/2015   H/O tubal ligation 12/27/2015   Colon polyps 12/27/2015   [employer] Federation of Gyne[employer] and Obstetric[employer] (FIGO) stage IVB epithelial ovarian cancer ([place]) 12/27/2015   Ovarian cancer, bilateral ([place]) 12/24/2015   Inguinal adenopathy    Carcinomatosis ([place]) 12/06/2015   Chronic atrial fibrillation 08/06/2015   History of stroke 08/05/2015   Aphasia due to recent cerebrovascular accident (CVA) 08/03/2015   Benign essential HTN 07/18/2014   A-fib ([place]) 07/18/2014   Anxiety state, unspecified 07/18/2014   Generalized anxiety disorder 07/18/2014    Past Surgical History:  Procedure Laterality Date   APPENDECTOMY     BREAST LUMPECTOMY  2008   left  COLONOSCOPY  2004   DEBULKING N/A 03/24/2016   Procedure: RADICAL TUMOR DEBULKING;  Surgeon: Everitt Amber, MD;  Location: [facility] ORS;  Service: Gynecology;  Laterality: N/A;   OMENTECTOMY N/A 03/24/2016   Procedure: OMENTECTOMY;  Surgeon: Everitt Amber, MD;  Location: [facility];  Service: Gynecology;  Laterality: N/A;     ROBOTIC ASSISTED TOTAL HYSTERECTOMY WITH BILATERAL SALPINGO OOPHERECTOMY Bilateral 03/24/2016   Procedure: XI ROBOTIC ASSISTED TOTA LAPAROSCOPIC  HYSTERECTOMY WITH BILATERAL SALPINGO OOPHORECTOMY;  Surgeon: Everitt Amber, MD;  Location: [facility];  Service: Gynecology;  Laterality: Bilateral;   TUBAL LIGATION       OB History   No obstetric history on file.     Family History  Problem Relation Age of Onset   Cirrhosis Father        +heavy EtOH   Diabetes Maternal Uncle    Other Daughter        hx benign breast bx   Breast cancer Neg Hx     Social History   Tobacco Use   Smoking status: Never Smoker   Smokeless tobacco: Never Used  Substance Use Topics   Alcohol use: Yes    Comment: occ beer or glass of wine   Drug use: No    Home Medications Prior to Admission medications   Medication Sig Start Date End Date Taking? Authorizing Provider  apixaban (ELIQUIS) 5 MG TABS tablet Take 1 tablet (5 mg total) by mouth 2 (two) times daily. 09/11/15  Yes Tysinger, Camelia Eng, PA-C  atorvastatin (LIPITOR) 40 MG tablet Take 40 mg by mouth at bedtime.   Yes [provider]  gabapentin (NEURONTIN) 600 MG tablet Take 1 tablet (600 mg total) by mouth 2 (two) times daily. 06/20/19 01/02/20 Yes Gorsuch, Ni, MD  lidocaine-prilocaine (EMLA) cream Apply to Porta-Cath 1-2 hours prior to access as directed. 09/14/18  Yes Gorsuch, Ni, MD  metoprolol succinate (TOPROL-XL) 25 MG 24 hr tablet Take 25 mg by mouth daily.  03/28/18  Yes [provider]  Multiple Vitamin (MULTIVITAMIN WITH MINERALS) TABS tablet Take 1 tablet by mouth daily.   Yes [provider]  omega-3 acid ethyl esters (LOVAZA) 1 g capsule Take 1 g by mouth daily.   Yes [provider]  ondansetron (ZOFRAN ODT) 4 MG disintegrating tablet Take 1 tablet (4 mg total) by mouth every 8 (eight) hours as needed for nausea or vomiting. 09/04/19  Yes Alvy Bimler, Ni, MD  oxyCODONE (OXY IR/ROXICODONE) 5 MG immediate  release tablet Take 1 tablet (5 mg total) by mouth every 6 (six) hours as needed for severe pain. 12/19/19  Yes Gorsuch, Ni, MD  PARoxetine (PAXIL) 40 MG tablet Take 40 mg by mouth daily. 11/11/18  Yes [provider]  polyethylene glycol (MIRALAX / GLYCOLAX) packet Take 17 g by mouth daily.   Yes [provider]  verapamil (CALAN-SR) 240 MG CR tablet Take 1 tablet (240 mg total) by mouth daily. 07/25/15  Yes Tysinger, Camelia Eng, PA-C  prochlorperazine (COMPAZINE) 10 MG tablet Take 1 tablet (10 mg total) by mouth every 6 (six) hours as needed (Nausea or vomiting). 07/13/17 01/17/18  Heath Lark, MD    Allergies    Codeine, Lactulose, and Latex  Review of Systems   Review of Systems  All other systems reviewed and are negative.   Physical Exam Updated Vital Signs BP (!) 148/82 (BP Location: Left Arm)    Pulse 78    Temp 99 F (37.2 C) (Oral)    Resp 18  SpO2 98%   Physical Exam Vitals and nursing note reviewed.   69 year old female, resting comfortably and in no acute distress. Vital signs are significant for elevated blood pressure. Oxygen saturation is 98%, which is normal. Head is normocephalic and atraumatic. PERRLA, EOMI. Oropharynx is clear. Neck is nontender and supple without adenopathy or JVD. Back is nontender and there is no CVA tenderness. Lungs are clear without rales, wheezes, or rhonchi. Chest is nontender.  Mediport present on the right. Heart has regular rate and rhythm without murmur. Abdomen is soft, flat, nontender without masses or hepatosplenomegaly and peristalsis is normoactive. Extremities: Flexion contracture present right arm.  Right forearm circumference is approximately 1 cm greater than left forearm circumference.  Temperature seems symmetric bilaterally.  Distal pulses are strong and capillary refill is prompt.  No erythema is noted. Skin is warm and dry without rash. Neurologic: Awake and alert.  Expressive aphasia present.  Right  hemiparesis present.  ED Results / Procedures / Treatments   Labs (all labs ordered are listed, but only abnormal results are displayed) Labs Reviewed  COMPREHENSIVE METABOLIC PANEL - Abnormal; Notable for the following components:      Result Value   Glucose, Bld 116 (*)    All other components within normal limits  CBC WITH DIFFERENTIAL/PLATELET - Abnormal; Notable for the following components:   RBC 2.71 (*)    Hemoglobin 10.7 (*)    HCT 32.9 (*)    MCV 121.4 (*)    MCH 39.5 (*)    RDW 16.7 (*)    All other components within normal limits  LACTIC ACID, PLASMA  LACTIC ACID, PLASMA    Procedures Procedures   Medications Ordered in ED Medications  sodium chloride flush (NS) 0.9 % injection 3 mL (has no administration in time range)    ED Course  I have reviewed the triage vital signs and the nursing notes.  Pertinent lab results that were available during my care of the patient were reviewed by me and considered in my medical decision making (see chart for details).  MDM Rules/Calculators/A&P Right arm pain with concern for infection.  No signs of cellulitis present on my exam.  CBC shows stable anemia and no leukocytosis.  However, the arm does appear slightly swollen and it is on the same side that she has a port.  Although she is anticoagulated on apixaban, will have her come back tomorrow for venous Doppler.  Final Clinical Impression(s) / ED Diagnoses Final diagnoses:  Right arm pain  Macrocytic anemia    Rx / DC Orders ED Discharge Orders         Ordered    UE VENOUS DUPLEX     01/02/20 [id number]           Delora Fuel, MD [id number]

## 2020-01-02 NOTE — Discharge Instructions (Signed)
Return tomorrow for venous duplex ultrasound to make sure you don't have a blood clot in your arm.  You may take acetaminophen for pain.

## 2020-01-03 ENCOUNTER — Ambulatory Visit (HOSPITAL_COMMUNITY): Admission: RE | Admit: 2020-01-03 | Payer: Medicare (Managed Care) | Source: Ambulatory Visit

## 2020-01-03 LAB — LACTIC ACID, PLASMA: Lactic Acid, Venous: 2.4 mmol/L (ref 0.5–1.9)

## 2020-01-04 ENCOUNTER — Other Ambulatory Visit: Payer: Self-pay | Admitting: Hematology and Oncology

## 2020-01-04 ENCOUNTER — Telehealth: Payer: Self-pay

## 2020-01-04 DIAGNOSIS — C562 Malignant neoplasm of left ovary: Secondary | ICD-10-CM

## 2020-01-04 DIAGNOSIS — M79601 Pain in right arm: Secondary | ICD-10-CM | POA: Insufficient documentation

## 2020-01-04 NOTE — Telephone Encounter (Signed)
Called Brenda Cunningham back. She talked with her Mom and she is still complaining of pain in her arm. The pain is better than it was earlier. She does not feel she needs appt to see Dr. Alvy Bimler, but she would like appt for doppler tomorrow after 11 am. If Dr. Alvy Bimler could order. Ms. Adema said she complained of the arm pain to the nurse on 1/26 during infusion. Kenney Houseman does not know if she really did complain to the nurse. Since the stroke she does not remember things correctly.

## 2020-01-04 NOTE — Telephone Encounter (Signed)
Called and given appt for tomorrow at 1 pm, arrive at 1245 at [facility]. Daughter verbalized understanding.

## 2020-01-04 NOTE — Telephone Encounter (Signed)
Called daughter regarding 1/26 visit to ER for complaint of arm pain. Mrs. Brenda Cunningham did not feel like going to appt yesterday for doppler she was too tired after going to ER. Kenney Houseman will call her mother to see what she wants to do yesterday she was feeling better. She will call the office back.

## 2020-01-04 NOTE — Telephone Encounter (Signed)
I order venous doppler for tomorrow Can you help schedule and then call daughter? I think you might need to involve Threasa Beards about infusion incident

## 2020-01-05 ENCOUNTER — Other Ambulatory Visit: Payer: Self-pay

## 2020-01-05 ENCOUNTER — Telehealth: Payer: Self-pay

## 2020-01-05 ENCOUNTER — Ambulatory Visit (HOSPITAL_COMMUNITY)
Admission: RE | Admit: 2020-01-05 | Discharge: 2020-01-05 | Disposition: A | Discharge status: Home / Self Care | Payer: Medicare (Managed Care) | Source: Ambulatory Visit | Attending: Hematology and Oncology | Admitting: Hematology and Oncology

## 2020-01-05 DIAGNOSIS — C562 Malignant neoplasm of left ovary: Secondary | ICD-10-CM | POA: Diagnosis present

## 2020-01-05 DIAGNOSIS — M79601 Pain in right arm: Secondary | ICD-10-CM | POA: Insufficient documentation

## 2020-01-05 NOTE — Progress Notes (Signed)
Right upper extremity venous duplex  has been completed. Refer to [facility] under chart review to view preliminary results.   01/05/2020  1:35 PM Brenda Cunningham, Brenda Cunningham

## 2020-01-05 NOTE — Telephone Encounter (Signed)
-----   Message from Heath Lark, MD sent at 01/05/2020  2:03 PM EST ----- Regarding: pls call daughter, US showed no evidence of blood clot

## 2020-01-05 NOTE — Telephone Encounter (Signed)
Called and given below message. She verbalized understanding. 

## 2020-01-16 ENCOUNTER — Encounter: Payer: Self-pay | Admitting: Hematology and Oncology

## 2020-01-16 ENCOUNTER — Other Ambulatory Visit: Payer: Self-pay

## 2020-01-16 ENCOUNTER — Inpatient Hospital Stay: Payer: Medicare (Managed Care) | Attending: Hematology and Oncology

## 2020-01-16 ENCOUNTER — Inpatient Hospital Stay (HOSPITAL_BASED_OUTPATIENT_CLINIC_OR_DEPARTMENT_OTHER): Payer: Medicare (Managed Care) | Admitting: Hematology and Oncology

## 2020-01-16 ENCOUNTER — Inpatient Hospital Stay: Payer: Medicare (Managed Care)

## 2020-01-16 ENCOUNTER — Other Ambulatory Visit: Payer: Self-pay | Admitting: Hematology and Oncology

## 2020-01-16 DIAGNOSIS — C562 Malignant neoplasm of left ovary: Secondary | ICD-10-CM

## 2020-01-16 DIAGNOSIS — C563 Malignant neoplasm of bilateral ovaries: Secondary | ICD-10-CM

## 2020-01-16 DIAGNOSIS — C800 Disseminated malignant neoplasm, unspecified: Secondary | ICD-10-CM

## 2020-01-16 DIAGNOSIS — C561 Malignant neoplasm of right ovary: Secondary | ICD-10-CM

## 2020-01-16 DIAGNOSIS — G8191 Hemiplegia, unspecified affecting right dominant side: Secondary | ICD-10-CM | POA: Diagnosis not present

## 2020-01-16 DIAGNOSIS — Z7901 Long term (current) use of anticoagulants: Secondary | ICD-10-CM | POA: Diagnosis not present

## 2020-01-16 DIAGNOSIS — Z5111 Encounter for antineoplastic chemotherapy: Secondary | ICD-10-CM | POA: Diagnosis present

## 2020-01-16 DIAGNOSIS — D649 Anemia, unspecified: Secondary | ICD-10-CM | POA: Diagnosis not present

## 2020-01-16 DIAGNOSIS — Z7189 Other specified counseling: Secondary | ICD-10-CM

## 2020-01-16 DIAGNOSIS — C778 Secondary and unspecified malignant neoplasm of lymph nodes of multiple regions: Secondary | ICD-10-CM | POA: Diagnosis not present

## 2020-01-16 DIAGNOSIS — Z79899 Other long term (current) drug therapy: Secondary | ICD-10-CM | POA: Diagnosis not present

## 2020-01-16 DIAGNOSIS — Z95828 Presence of other vascular implants and grafts: Secondary | ICD-10-CM

## 2020-01-16 DIAGNOSIS — G8190 Hemiplegia, unspecified affecting unspecified side: Secondary | ICD-10-CM | POA: Diagnosis not present

## 2020-01-16 DIAGNOSIS — D638 Anemia in other chronic diseases classified elsewhere: Secondary | ICD-10-CM | POA: Diagnosis not present

## 2020-01-16 DIAGNOSIS — C786 Secondary malignant neoplasm of retroperitoneum and peritoneum: Secondary | ICD-10-CM | POA: Insufficient documentation

## 2020-01-16 LAB — CBC WITH DIFFERENTIAL (CANCER CENTER ONLY)
Abs Immature Granulocytes: 0.04 10*3/uL (ref 0.00–0.07)
Basophils Absolute: 0 10*3/uL (ref 0.0–0.1)
Basophils Relative: 0 %
Eosinophils Absolute: 0.1 10*3/uL (ref 0.0–0.5)
Eosinophils Relative: 1 %
HCT: 35 % — ABNORMAL LOW (ref 36.0–46.0)
Hemoglobin: 11.5 g/dL — ABNORMAL LOW (ref 12.0–15.0)
Immature Granulocytes: 1 %
Lymphocytes Relative: 20 %
Lymphs Abs: 1.7 10*3/uL (ref 0.7–4.0)
MCH: 38.1 pg — ABNORMAL HIGH (ref 26.0–34.0)
MCHC: 32.9 g/dL (ref 30.0–36.0)
MCV: 115.9 fL — ABNORMAL HIGH (ref 80.0–100.0)
Monocytes Absolute: 1 10*3/uL (ref 0.1–1.0)
Monocytes Relative: 13 %
Neutro Abs: 5.3 10*3/uL (ref 1.7–7.7)
Neutrophils Relative %: 65 %
Platelet Count: 189 10*3/uL (ref 150–400)
RBC: 3.02 MIL/uL — ABNORMAL LOW (ref 3.87–5.11)
RDW: 15.3 % (ref 11.5–15.5)
WBC Count: 8.1 10*3/uL (ref 4.0–10.5)
nRBC: 0 % (ref 0.0–0.2)

## 2020-01-16 LAB — CMP (CANCER CENTER ONLY)
ALT: 21 U/L (ref 0–44)
AST: 20 U/L (ref 15–41)
Albumin: 3.5 g/dL (ref 3.5–5.0)
Alkaline Phosphatase: 104 U/L (ref 38–126)
Anion gap: 8 (ref 5–15)
BUN: 13 mg/dL (ref 8–23)
CO2: 27 mmol/L (ref 22–32)
Calcium: 9.3 mg/dL (ref 8.9–10.3)
Chloride: 108 mmol/L (ref 98–111)
Creatinine: 0.76 mg/dL (ref 0.44–1.00)
GFR, Est AFR Am: >60 mL/min (ref >60)
GFR, Estimated: >60 mL/min (ref >60)
Glucose, Bld: 95 mg/dL (ref 70–99)
Potassium: 3.8 mmol/L (ref 3.5–5.1)
Sodium: 143 mmol/L (ref 135–145)
Total Bilirubin: 0.5 mg/dL (ref 0.3–1.2)
Total Protein: 7.3 g/dL (ref 6.5–8.1)

## 2020-01-16 MED ORDER — SODIUM CHLORIDE 0.9 % IV SOLN
Freq: Once | INTRAVENOUS | Status: AC
  Filled 2020-01-16: qty 250

## 2020-01-16 MED ORDER — SODIUM CHLORIDE 0.9% FLUSH
10.0000 mL | INTRAVENOUS | Status: DC | PRN
  Administered 2020-01-16: 10 mL
  Filled 2020-01-16: qty 10

## 2020-01-16 MED ORDER — PROCHLORPERAZINE MALEATE 10 MG PO TABS
ORAL_TABLET | ORAL | Status: AC
  Filled 2020-01-16: qty 1

## 2020-01-16 MED ORDER — SODIUM CHLORIDE 0.9 % IV SOLN
600.0000 mg/m2 | Freq: Once | INTRAVENOUS | Status: AC
  Administered 2020-01-16: 1292 mg via INTRAVENOUS
  Filled 2020-01-16: qty 33.98

## 2020-01-16 MED ORDER — PROCHLORPERAZINE MALEATE 10 MG PO TABS: 10.0000 mg | ORAL_TABLET | Freq: Once | ORAL | Status: DC

## 2020-01-16 MED ORDER — PROCHLORPERAZINE MALEATE 10 MG PO TABS
10.0000 mg | ORAL_TABLET | Freq: Once | ORAL | Status: AC
  Administered 2020-01-16: 10 mg via ORAL

## 2020-01-16 MED ORDER — SODIUM CHLORIDE 0.9% FLUSH
10.0000 mL | Freq: Once | INTRAVENOUS | Status: DC
  Filled 2020-01-16: qty 10

## 2020-01-16 MED ORDER — HEPARIN SOD (PORK) LOCK FLUSH 100 UNIT/ML IV SOLN
500.0000 [IU] | Freq: Once | INTRAVENOUS | Status: AC | PRN
  Administered 2020-01-16: 12:00:00 500 [IU]
  Filled 2020-01-16: qty 5

## 2020-01-16 NOTE — Assessment & Plan Note (Signed)
Her right arm feels warm Recent ultrasound venous Doppler excluded DVT I reassured the family

## 2020-01-16 NOTE — Assessment & Plan Note (Signed)
She tolerated treatment every other week well She is mildly anemic but not symptomatic She have no signs of bleeding Observe for now 

## 2020-01-16 NOTE — Patient Instructions (Signed)
[facility] °Discharge Instructions for Patients Receiving Chemotherapy ° °Today you received the following chemotherapy agents Gemzar ° °To help prevent nausea and vomiting after your treatment, we encourage you to take your nausea medication as directed. °  °If you develop nausea and vomiting that is not controlled by your nausea medication, call the clinic.  ° °BELOW ARE SYMPTOMS THAT SHOULD BE REPORTED IMMEDIATELY: °· *FEVER GREATER THAN 100.5 F °· *CHILLS WITH OR WITHOUT FEVER °· NAUSEA AND VOMITING THAT IS NOT CONTROLLED WITH YOUR NAUSEA MEDICATION °· *UNUSUAL SHORTNESS OF BREATH °· *UNUSUAL BRUISING OR BLEEDING °· TENDERNESS IN MOUTH AND THROAT WITH OR WITHOUT PRESENCE OF ULCERS °· *URINARY PROBLEMS °· *BOWEL PROBLEMS °· UNUSUAL RASH °Items with * indicate a potential emergency and should be followed up as soon as possible. ° °Feel free to call the clinic should you have any questions or concerns. The clinic phone number is [phone]. ° °Please show the CHEMO ALERT CARD at check-in to the Emergency Department and triage nurse. ° ° °

## 2020-01-16 NOTE — Assessment & Plan Note (Signed)
She tolerated treatment well without major side effects We will continue treatment every other week I will see her every other time She will be due for repeat CT imaging again at the end of March

## 2020-01-16 NOTE — Progress Notes (Signed)
[facility] OFFICE PROGRESS NOTE  Patient Care Team: Helane Rima, Brenda as PCP - General (Family Medicine) Encarnacion Slates, Brenda as Referring Physician (Neurology) Cameron Sprang, Brenda as Consulting Physician (Neurology)  ASSESSMENT & PLAN:  Ovarian cancer [facility]) She tolerated treatment well without major side effects We will continue treatment every other week I will see her every other time She will be due for repeat CT imaging again at the end of March  Anemia, chronic disease She tolerated treatment every other week well She is mildly anemic but not symptomatic She have no signs of bleeding Observe for now  Right hemiparesis ([place]) Her right arm feels warm Recent ultrasound venous Doppler excluded DVT I reassured the family   No orders of the defined types were placed in this encounter.   All questions were answered. The patient knows to call the clinic with any problems, questions or concerns. The total time spent in the appointment was 20 minutes encounter with patients including review of chart and various tests results, discussions about plan of care and coordination of care plan   Heath Lark, Brenda 01/16/2020 9:41 AM  INTERVAL HISTORY: Please see below for problem oriented charting. She returns for further follow-up Her daughter is present She has intermittent discomfort on the right arm No recent infection, fever or chills No recent nausea The patient denies any recent signs or symptoms of bleeding such as spontaneous epistaxis, hematuria or hematochezia. Overall, she tolerated treatment well SUMMARY OF ONCOLOGIC HISTORY: Oncology History Overview Note  Negative genetic testing Progressed on [place]ir[place]   Carcinomatosis ([place])  12/06/2015 Initial Diagnosis   Carcinomatosis ([place])   Ovarian cancer, bilateral ([place])  12/24/2015 Initial Diagnosis   Ovarian cancer, bilateral ([place])   Ovarian cancer ([place])  11/28/2015 Imaging   Ct scan of abdomen: Peritoneal  carcinomatosis and pelvic/inguinal adenopathy. Gynecologic primary is favored.   12/17/2015 Pathology Results   Lymph node, needle/core biopsy, L inguinal LAN - METASTATIC ADENOCARCINOMA. Microscopic Comment Immunohistochemistry will be performed and reported as an addendum. (JDP:ecj 12/18/2015) ADDENDUM: Immunohistochemistry shows strong positivity with cytokeratin 7, estrogen receptor (ER), progesterone receptor (PR), p53 and WT1. Negative markers are cytokeratin 20 , CDX- 2 and gross cystic disease fluid protein. 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 (JDP:kh 12-19-15   12/17/2015 Procedure   Technically successful ultrasound guided core left inguinal adenopathy biopsy   12/24/2015 Tumor Marker   Patient's tumor was tested for the following markers: CA-125 Results of the tumor marker test revealed 608.2   12/27/2015 - 02/28/2016 Chemotherapy   She received neoadjuvant chemo x 3 cycles   01/01/2016 Tumor Marker   Patient's tumor was tested for the following markers: CA-125 Results of the tumor marker test revealed 741.6   01/29/2016 Procedure   Successful placement of a right internal jugular approach power injectable Port-A-Cath. The catheter is ready for immediate use   01/29/2016 Imaging   US abdomen 1. Hyperechoic 2.5 x 1.0 x 1.6 cm lesion in the region the caudate lobe of the liver. Similar finding noted on prior CT of 11/28/2015. This could represent hemangioma. This could represent a malignancy including metastatic disease. 2. Exam otherwise unremarkable. No gallstones. No biliary distention.   02/03/2016 Tumor Marker   Patient's tumor was tested for the following markers: CA-125 Results of the tumor marker test revealed 137.1   02/20/2016 Imaging   MRI brain No acute infarct.  Remote large left middle  cerebral artery distribution infarct.  No intracranial mass.  Moderate  small vessel disease changes.  Global atrophy without hydrocephalus.  Expanded partially empty sella without secondary findings of pseudotumor cerebri.  Minimal mucosal thickening ethmoid sinus air cells. Small air-fluid levels maxillary sinuses bilaterally may indicate changes of acute sinusitis.    02/28/2016 Tumor Marker   Patient's tumor was tested for the following markers: CA-125 Results of the tumor marker test revealed 33.6   03/02/2016 Imaging   CT chest, abdomen and pelvis 1. Today's study demonstrates a positive response to therapy with resolution of the previously noted malignant ascites, significant regression of previously noted peritoneal implants, and regression of previously noted lymphadenopathy in the abdomen and pelvis. 2. No definite evidence to suggest metastatic disease to the thorax. 3. Stable 1.0 x 1.7 cm intermediate attenuation lesion associated with the posterior aspect of segment 1 of the liver, favored to represent a mildly proteinaceous hepatic cyst. The possibility of a peritoneal implant in this region is not entirely excluded, but is not favored on today's examination. 4. Extensive post infectious scarring inguinal upper right lung, likely sequela of prior necrotizing pneumonia. 5. Multiple tiny pulmonary nodules scattered throughout the lungs bilaterally all measuring 4 mm or less. These are nonspecific, but favored to be benign. Given the patient's history of primary gynecologic malignancy, attention on followup studies is recommended to ensure the stability of these findings. 6. Additional incidental findings, as above   03/14/2016 Imaging   CT angiogram 1. No pulmonary emboli. 2. Chronic changes to the right upper lobe. 3. Stable lesion in the caudate lobe of the liver. 4. Stable soft tissue prominence to the right of the trachea as described above. 5. Stable small nodule in the right lung.    03/24/2016 Pathology Results   1. Omentum, resection  for tumor - FOCAL ADENOCARCINOMA ASSOCIATED WITH EXTENSIVE FIBROSIS, INFLAMMATION AND HEMOSIDERIN DEPOSITION. 2. Uterus +/- tubes/ovaries, neoplastic, cervix - CERVIX: SLIGHT CERVICITIS AND SQUAMOUS METAPLASIA. - ENDOMETRIUM: ATROPHIC, NO HYPERPLASIA OR MALIGNANCY. - MYOMETRIUM: LEIOMYOMATA WITH DEGENERATIVE CHANGES. NO MALIGNANCY. - UTERINE SEROSA: FOCAL ADENOCARCINOMA ASSOCIATED WITH ADHESIONS. - RIGHT OVARY: BENIGN CALCIFIED NODULE AND BENIGN SEROUS CYST. NO MALIGNANCY IDENTIFIED. - RIGHT FALLOPIAN TUBE: UNREMARKABLE. NO MALIGNANCY IDENTIFIED. - LEFT OVARY: FOCAL ADENOCARCINOMA ASSOCIATED WITH EXTENSIVE FIBROSIS. - LEFT FALLOPIAN TUBE: HYDROSALPINX. NO MALIGNANCY IDENTIFIED. 3. Lymph node, biopsy, right external iliac - METASTATIC ADENOCARCINOMA, 4. Soft tissue, biopsy, left para colic gutter - FOCAL ADENOCARCINOMA ASSOCIATED WITH FIBROSIS. Microscopic Comment 2. OVARY Specimen(s): Uterus with bilateral ovaries, omentum, right external iliac lymph node and left paracolic gutter. Procedure: (including lymph node sampling) Hysterectomy with bilateral salpingo-oophorectomy, omentectomy, lymph node biopsy and paracolic gutter biopsy. Primary tumor site (including laterality): Left ovary. Ovarian surface involvement: Yes Ovarian capsule intact without fragmentation: N/A Maximum tumor size (cm): 2 cm, 2 cm Histologic type: Serous carcinoma. Grade: 3 Peritoneal implants: (specify invasive or non-invasive): Left paracolic gutter positive for carcinoma Pelvic extension (list additional structures on separate lines and if involved): Omentum, right paracolic gutter and uterine serosa. Lymph nodes: number examined 1 ; number positive 1 TNM code: ypT3a, ypN1b, ypMX FIGO Stage (based on pathologic findings, needs clinical correlation): IIIA2 Comments: The left ovary has a 2 cm nodule, which is composed primarily of fibrous tissue with small foci of residual adenocarcinoma. There is also  microscopic involvement of the uterine serosa, the left paracolic gutter biopsy and the omentum. The left paracolic gutter and omental involvement is microscopic and both are associated with extensive fibrosis with  focal hemosiderin deposition. The right external iliac node is completely replaced by metastatic adenocarcinoma with serous features. (JDP:ecj 03/30/2016)   03/24/2016 Surgery   Operation: Robotic-assisted laparoscopic total hysterectomy with bilateral salpingoophorectomy, omentectomy, radical tumor debulking  Surgeon: Donaciano Eva  Operative Findings:  : small nodules in omentum, no ascites. Omental nodule (2cm) adherent to lower anterior abdominal wall. 2cm left colonic gutter cystic nodule. 6cm right external iliac lymph node. Grossly normal ovaries and tubes. Fibroid uterus. No gross residual disease at completion of surgery representing R0 optimal/complete resection.    04/17/2016 Imaging   CT abdomen and pelvis 1. There is no evidence of acute inflammatory process within abdomen or pelvis. 2. Stable low-density lesion within caudate lobe of the liver. No new hepatic lesions are noted. 3. Again noted lobulated renal contour and multifocal renal cortical scarring. No hydronephrosis or hydroureter. 4. No significant mesenteric adenopathy. No retroperitoneal adenopathy. Stable minimal residual peritoneal thickening within pelvis. No new pelvic implants or pelvic ascites. 5. Status post hysterectomy.  Unremarkable urinary bladder. 6. Moderate stool within colon as described above. No evidence of colitis.    04/24/2016 - 06/19/2016 Chemotherapy   She received 3 more cycles of chemo after surgery   05/14/2016 Tumor Marker   Patient's tumor was tested for the following markers: CA-125 Results of the tumor marker test revealed 22.5   06/04/2016 Tumor Marker   Patient's tumor was tested for the following markers: CA-125 Results of the tumor marker test revealed 13.7    07/07/2016 Genetic Testing   Patient has genetic testing done for breast/ovasrian cancer panel Results revealed patient has no actionable mutation   07/23/2016 Imaging   Ct abdomen and pelvis 1. Heterogeneously enhancing 4.9 x 3.2 x 4.4 cm mass along the right pelvic sidewall may represent locally recurrent disease or malignant lymphadenopathy. 2. No other signs of definite metastatic disease noted elsewhere in the abdomen or pelvis. 3. Stable low-attenuation hepatic lesion in the caudate lobe of the liver, which appears to demonstrates some progressive centripetal filling on delayed images. This lesion is presumably benign given its stability compared to prior studies, and is favored to represent a small cavernous hemangioma. 4. Cardiomegaly with biatrial dilatation, which is very severe on the right side. 5. Aortic atherosclerosis. 6. Additional incidental findings, as above.   07/23/2016 Tumor Marker   Patient's tumor was tested for the following markers: CA-125 Results of the tumor marker test revealed 12.3   09/30/2016 Imaging   Ct abdomen and pelvis: 1. 4.9 cm heterogeneously enhancing mass seen along the right pelvic sidewall previously has almost completely resolved. There is some ill-defined soft tissue attenuation/peritoneal thickening in this region today but no discrete measurable lesion is evident. 2. No new or progressive findings on today's exam. 3. Stable hypo attenuating lesion in the caudate lobe of the liver. 4. Subtle aortic atherosclerosis   09/30/2016 Tumor Marker   Patient's tumor was tested for the following markers: CA-125 Results of the tumor marker test revealed 9.6   10/05/2016 Pathology Results   Vagina, biopsy, left cuff - BENIGN FIBROEPITHELIAL (STROMAL) POLYP. - NO DYSPLASIA, ATYPIA OR MALIGNANCY IDENTIFIED.   01/14/2017 Tumor Marker   Patient's tumor was tested for the following markers: CA-125 Results of the tumor marker test revealed 11.9    05/05/2017 Tumor Marker   Patient's tumor was tested for the following markers: CA-125 Results of the tumor marker test revealed 28   06/14/2017 Tumor Marker   Patient's tumor was tested for the following markers:  CA-125 Results of the tumor marker test revealed 45.7   06/24/2017 Imaging   Ct abdomen and pelvis: Increased peritoneal metastatic disease in abdomen and pelvis since prior exam. No evidence of ascites. Stable small benign hepatic hemangioma.   07/21/2017 Tumor Marker   Patient's tumor was tested for the following markers: CA-125 Results of the tumor marker test revealed 84.1   07/21/2017 - 11/03/2017 Chemotherapy   She received carboplatin and taxol   08/11/2017 Adverse Reaction   She had severe neuropathy due to treatment. Does of chemo is reduced starting cycle 2 onwards   09/02/2017 Tumor Marker   Patient's tumor was tested for the following markers: CA-125 Results of the tumor marker test revealed 27.7   09/16/2017 Imaging   CT CHEST IMPRESSION  1. No acute process or evidence of metastatic disease in the chest. 2. Right upper lung bronchiectasis, consolidation, and architectural distortion are most likely post infectious or inflammatory and not significantly changed. 3. Aortic Atherosclerosis (ICD10-I70.0).  CT ABDOMEN AND PELVIS IMPRESSION  1. Response to therapy of nodal and peritoneal metastasis. 2. No new or progressive disease. 3. Caudate lobe hemangioma, as before. 4. Bilateral renal cortical thinning. Similar minimal right-sided caliectasis and hydroureter. Likely secondary to low-grade obstruction by the dominant right pelvic implant.   09/22/2017 Tumor Marker   Patient's tumor was tested for the following markers: CA-125 Results of the tumor marker test revealed 18   11/03/2017 Tumor Marker   Patient's tumor was tested for the following markers: CA-125 Results of the tumor marker test revealed 13.4   11/25/2017 Imaging   1. Stable to slight  interval decrease in size of nodal and peritoneal metastatic disease within the abdomen. 2. No new or progressive disease.   12/03/2017 - 09/11/2019 Chemotherapy   The patient is prescribed [place]   01/17/2018 Tumor Marker   Patient's tumor was tested for the following markers: CA-125 Results of the tumor marker test revealed 11.9   02/22/2018 Tumor Marker   Patient's tumor was tested for the following markers: CA-125 Results of the tumor marker test revealed 11.7   02/22/2018 Imaging   1. Improved nodular thickening along the vaginal cuff on the left and medial to the right iliac vessels, likely treated implants. No evidence of progressive peritoneal disease. 2. Stable hepatic hemangioma. No evidence of solid visceral organ metastases. 3. Bilateral renal cortical scarring.   05/19/2018 Tumor Marker   Patient's tumor was tested for the following markers: CA-125 Results of the tumor marker test revealed 9.9   08/10/2018 Imaging   Stable mild nodular thickening of the left vaginal cuff. No new or progressive disease within the abdomen or pelvis.  Stable small hepatic hemangioma.   09/21/2018 Tumor Marker   Patient's tumor was tested for the following markers: CA-125 Results of the tumor marker test revealed 10.3   11/11/2018 Tumor Marker   Patient's tumor was tested for the following markers: CA-125 Results of the tumor marker test revealed 10.5   12/15/2018 Tumor Marker   Patient's tumor was tested for the following markers: CA-125 Results of the tumor marker test revealed 12.6   01/06/2019 Tumor Marker   Patient's tumor was tested for the following markers: CA-125 Results of the tumor marker test revealed 14.6   01/12/2019 Imaging   1. Chronic extensive right upper lobe scarring changes and bronchiectasis. 2. No mediastinal or hilar mass or adenopathy and no findings for pulmonary metastatic disease. 3. Stable caudate lobe hemangioma. 4. No worrisome omental or peritoneal surface  lesions. 5. Stable pericecal and small pelvic lymph nodes. No new or progressive findings.    09/11/2019 Imaging   Ct abdomen and pelvis Pericecal peritoneal metastatic implants   09/19/2019 -  Chemotherapy   The patient had carboplatin and gemxar for chemotherapy treatment.     11/20/2019 Imaging   1. Mild decrease in size of peritoneal tumor implant along the lateral aspect of the cecum. 2. Stable tiny sub-cm mesenteric soft tissue nodules or lymph nodes in right lower quadrant and anterior pelvic mesentery. 3. No new or progressive disease identified within the abdomen or pelvis.     REVIEW OF SYSTEMS:   Constitutional: Denies fevers, chills or abnormal weight loss Eyes: Denies blurriness of vision Ears, nose, mouth, throat, and face: Denies mucositis or sore throat Respiratory: Denies cough, dyspnea or wheezes Cardiovascular: Denies palpitation, chest discomfort or lower extremity swelling Gastrointestinal:  Denies nausea, heartburn or change in bowel habits Skin: Denies abnormal skin rashes Lymphatics: Denies new lymphadenopathy or easy bruising Neurological:Denies numbness, tingling or new weaknesses Behavioral/Psych: Mood is stable, no new changes  All other systems were reviewed with the patient and are negative.  I have reviewed the past medical history, past surgical history, social history and family history with the patient and they are unchanged from previous note.  ALLERGIES:  is allergic to codeine; lactulose; and latex.  MEDICATIONS:  Current Outpatient Medications  Medication Sig Dispense Refill  . apixaban (ELIQUIS) 5 MG TABS tablet Take 1 tablet (5 mg total) by mouth 2 (two) times daily. 60 tablet 5  . atorvastatin (LIPITOR) 40 MG tablet Take 40 mg by mouth at bedtime.    . gabapentin (NEURONTIN) 600 MG tablet Take 1 tablet (600 mg total) by mouth 2 (two) times daily. 60 tablet 11  . lidocaine-prilocaine (EMLA) cream Apply to Porta-Cath 1-2 hours prior to  access as directed. 30 g 1  . metoprolol succinate (TOPROL-XL) 25 MG 24 hr tablet Take 25 mg by mouth daily.   5  . Multiple Vitamin (MULTIVITAMIN WITH MINERALS) TABS tablet Take 1 tablet by mouth daily.    Marland Kitchen omega-3 acid ethyl esters (LOVAZA) 1 g capsule Take 1 g by mouth daily.    . ondansetron (ZOFRAN ODT) 4 MG disintegrating tablet Take 1 tablet (4 mg total) by mouth every 8 (eight) hours as needed for nausea or vomiting. 60 tablet 9  . oxyCODONE (OXY IR/ROXICODONE) 5 MG immediate release tablet Take 1 tablet (5 mg total) by mouth every 6 (six) hours as needed for severe pain. 60 tablet 0  . PARoxetine (PAXIL) 40 MG tablet Take 40 mg by mouth daily.  0  . polyethylene glycol (MIRALAX / GLYCOLAX) packet Take 17 g by mouth daily.    . verapamil (CALAN-SR) 240 MG CR tablet Take 1 tablet (240 mg total) by mouth daily. 30 tablet 0   No current facility-administered medications for this visit.    PHYSICAL EXAMINATION: ECOG PERFORMANCE STATUS: 2 - Symptomatic, <50% confined to bed  Vitals:   01/16/20 0934  BP: 119/62  Pulse: (!) 53  Resp: 18  Temp: 98 F (36.7 C)  SpO2: 100%   Filed Weights   01/16/20 0934  Weight: 202 lb 9.6 oz (91.9 kg)    GENERAL:alert, no distress and comfortable SKIN: skin color, texture, turgor are normal, no rashes or significant lesions EYES: normal, Conjunctiva are pink and non-injected, sclera clear OROPHARYNX:no exudate, no erythema and lips, buccal mucosa, and tongue normal  NECK: supple, thyroid normal size, non-tender,  without nodularity LYMPH:  no palpable lymphadenopathy in the cervical, axillary or inguinal LUNGS: clear to auscultation and percussion with normal breathing effort HEART: regular rate & rhythm and no murmurs and no lower extremity edema ABDOMEN:abdomen soft, non-tender and normal bowel sounds Musculoskeletal:no cyanosis of digits and no clubbing  NEURO: alert & oriented x 3 with expressive dysphagia and persistent weakness  consistent with her prior stroke  LABORATORY DATA:  I have reviewed the data as listed    Component Value Date/Time   NA 143 01/02/2020 2236   NA 142 12/08/2017 1051   K 3.7 01/02/2020 2236   K 4.4 12/08/2017 1051   CL 107 01/02/2020 2236   CL 105 06/14/2017 1430   CO2 28 01/02/2020 2236   CO2 28 12/08/2017 1051   GLUCOSE 116 (H) 01/02/2020 2236   GLUCOSE 97 12/08/2017 1051   BUN 12 01/02/2020 2236   BUN 16.2 12/08/2017 1051   CREATININE 0.69 01/02/2020 2236   CREATININE 0.74 01/02/2020 1354   CREATININE 0.9 12/08/2017 1051   CALCIUM 9.4 01/02/2020 2236   CALCIUM 9.5 12/08/2017 1051   PROT 7.2 01/02/2020 2236   PROT 6.9 12/08/2017 1051   ALBUMIN 3.8 01/02/2020 2236   ALBUMIN 3.6 12/08/2017 1051   AST 22 01/02/2020 2236   AST 18 01/02/2020 1354   AST 18 12/08/2017 1051   ALT 19 01/02/2020 2236   ALT 16 01/02/2020 1354   ALT 20 12/08/2017 1051   ALKPHOS 89 01/02/2020 2236   ALKPHOS 131 12/08/2017 1051   BILITOT 0.8 01/02/2020 2236   BILITOT 0.5 01/02/2020 1354   BILITOT 0.70 12/08/2017 1051   GFRNONAA >60 01/02/2020 2236   GFRNONAA >60 01/02/2020 1354   GFRAA >60 01/02/2020 2236   GFRAA >60 01/02/2020 1354    No results found for: SPEP, UPEP  Lab Results  Component Value Date   WBC 8.1 01/16/2020   NEUTROABS 5.3 01/16/2020   HGB 11.5 (L) 01/16/2020   HCT 35.0 (L) 01/16/2020   MCV 115.9 (H) 01/16/2020   PLT 189 01/16/2020      Chemistry      Component Value Date/Time   NA 143 01/02/2020 2236   NA 142 12/08/2017 1051   K 3.7 01/02/2020 2236   K 4.4 12/08/2017 1051   CL 107 01/02/2020 2236   CL 105 06/14/2017 1430   CO2 28 01/02/2020 2236   CO2 28 12/08/2017 1051   BUN 12 01/02/2020 2236   BUN 16.2 12/08/2017 1051   CREATININE 0.69 01/02/2020 2236   CREATININE 0.74 01/02/2020 1354   CREATININE 0.9 12/08/2017 1051      Component Value Date/Time   CALCIUM 9.4 01/02/2020 2236   CALCIUM 9.5 12/08/2017 1051   ALKPHOS 89 01/02/2020 2236   ALKPHOS 131  12/08/2017 1051   AST 22 01/02/2020 2236   AST 18 01/02/2020 1354   AST 18 12/08/2017 1051   ALT 19 01/02/2020 2236   ALT 16 01/02/2020 1354   ALT 20 12/08/2017 1051   BILITOT 0.8 01/02/2020 2236   BILITOT 0.5 01/02/2020 1354   BILITOT 0.70 12/08/2017 1051       RADIOGRAPHIC STUDIES: I have personally reviewed the radiological images as listed and agreed with the findings in the report. VAS [place] UPPER EXTREMITY VENOUS DUPLEX  Result Date: 01/06/2020 UPPER VENOUS STUDY  Indications: Pain Risk Factors: Cancer Ovarian Weak arm right arm from stroke in 2016. Performing Technologist: Oda Cogan RDMS, RVT  Examination Guidelines: A complete evaluation includes B-mode imaging, spectral  Doppler, color Doppler, and power Doppler as needed of all accessible portions of each vessel. Bilateral testing is considered an integral part of a complete examination. Limited examinations for reoccurring indications may be performed as noted.  Right Findings: +----------+------------+---------+-----------+----------+-------+ RIGHT     CompressiblePhasicitySpontaneousPropertiesSummary +----------+------------+---------+-----------+----------+-------+ IJV           Full       Yes       Yes                      +----------+------------+---------+-----------+----------+-------+ Subclavian    Full       Yes       Yes                      +----------+------------+---------+-----------+----------+-------+ Axillary      Full       Yes       Yes                      +----------+------------+---------+-----------+----------+-------+ Brachial      Full       Yes       Yes                      +----------+------------+---------+-----------+----------+-------+ Radial        Full                                          +----------+------------+---------+-----------+----------+-------+ Ulnar         Full                                           +----------+------------+---------+-----------+----------+-------+ Cephalic      Full                                          +----------+------------+---------+-----------+----------+-------+ Basilic       Full                                          +----------+------------+---------+-----------+----------+-------+  Left Findings: +----------+------------+---------+-----------+----------+-------+ LEFT      CompressiblePhasicitySpontaneousPropertiesSummary +----------+------------+---------+-----------+----------+-------+ Subclavian    Full       Yes       Yes                      +----------+------------+---------+-----------+----------+-------+  Summary:  Right: No evidence of deep vein thrombosis in the upper extremity. No evidence of superficial vein thrombosis in the upper extremity.  Left: No evidence of thrombosis in the subclavian.  *See table(s) above for measurements and observations.  Diagnosing physician: Servando Snare Brenda Electronically signed by Servando Snare Brenda on 01/06/2020 at 8:40:57 AM.    Final

## 2020-01-18 ENCOUNTER — Telehealth: Payer: Self-pay | Admitting: Hematology and Oncology

## 2020-01-18 NOTE — Telephone Encounter (Signed)
Scheduled appt per 2/9 sch message - pt daughter aware of appt date and time

## 2020-01-30 ENCOUNTER — Inpatient Hospital Stay: Payer: Medicare (Managed Care)

## 2020-01-30 ENCOUNTER — Ambulatory Visit: Payer: Medicare (Managed Care)

## 2020-01-30 ENCOUNTER — Other Ambulatory Visit: Payer: Medicare (Managed Care)

## 2020-01-30 ENCOUNTER — Other Ambulatory Visit: Payer: Self-pay

## 2020-01-30 VITALS — BP 127/75 | HR 53 | Temp 98.3°F | Resp 18

## 2020-01-30 DIAGNOSIS — Z95828 Presence of other vascular implants and grafts: Secondary | ICD-10-CM

## 2020-01-30 DIAGNOSIS — C562 Malignant neoplasm of left ovary: Secondary | ICD-10-CM

## 2020-01-30 DIAGNOSIS — C800 Disseminated malignant neoplasm, unspecified: Secondary | ICD-10-CM

## 2020-01-30 DIAGNOSIS — C561 Malignant neoplasm of right ovary: Secondary | ICD-10-CM

## 2020-01-30 DIAGNOSIS — Z7189 Other specified counseling: Secondary | ICD-10-CM

## 2020-01-30 DIAGNOSIS — C563 Malignant neoplasm of bilateral ovaries: Secondary | ICD-10-CM

## 2020-01-30 DIAGNOSIS — Z5111 Encounter for antineoplastic chemotherapy: Secondary | ICD-10-CM | POA: Diagnosis not present

## 2020-01-30 LAB — CBC WITH DIFFERENTIAL (CANCER CENTER ONLY)
Abs Immature Granulocytes: 0.01 10*3/uL (ref 0.00–0.07)
Basophils Absolute: 0 10*3/uL (ref 0.0–0.1)
Basophils Relative: 0 %
Eosinophils Absolute: 0.1 10*3/uL (ref 0.0–0.5)
Eosinophils Relative: 2 %
HCT: 34.4 % — ABNORMAL LOW (ref 36.0–46.0)
Hemoglobin: 11.3 g/dL — ABNORMAL LOW (ref 12.0–15.0)
Immature Granulocytes: 0 %
Lymphocytes Relative: 30 %
Lymphs Abs: 1.5 10*3/uL (ref 0.7–4.0)
MCH: 38.2 pg — ABNORMAL HIGH (ref 26.0–34.0)
MCHC: 32.8 g/dL (ref 30.0–36.0)
MCV: 116.2 fL — ABNORMAL HIGH (ref 80.0–100.0)
Monocytes Absolute: 0.6 10*3/uL (ref 0.1–1.0)
Monocytes Relative: 13 %
Neutro Abs: 2.6 10*3/uL (ref 1.7–7.7)
Neutrophils Relative %: 55 %
Platelet Count: 202 10*3/uL (ref 150–400)
RBC: 2.96 MIL/uL — ABNORMAL LOW (ref 3.87–5.11)
RDW: 15.1 % (ref 11.5–15.5)
WBC Count: 4.8 10*3/uL (ref 4.0–10.5)
nRBC: 0 % (ref 0.0–0.2)

## 2020-01-30 LAB — CMP (CANCER CENTER ONLY)
ALT: 24 U/L (ref 0–44)
AST: 23 U/L (ref 15–41)
Albumin: 3.4 g/dL — ABNORMAL LOW (ref 3.5–5.0)
Alkaline Phosphatase: 102 U/L (ref 38–126)
Anion gap: 9 (ref 5–15)
BUN: 14 mg/dL (ref 8–23)
CO2: 25 mmol/L (ref 22–32)
Calcium: 9 mg/dL (ref 8.9–10.3)
Chloride: 106 mmol/L (ref 98–111)
Creatinine: 0.77 mg/dL (ref 0.44–1.00)
GFR, Est AFR Am: >60 mL/min (ref >60)
GFR, Estimated: >60 mL/min (ref >60)
Glucose, Bld: 111 mg/dL — ABNORMAL HIGH (ref 70–99)
Potassium: 4 mmol/L (ref 3.5–5.1)
Sodium: 140 mmol/L (ref 135–145)
Total Bilirubin: 0.5 mg/dL (ref 0.3–1.2)
Total Protein: 6.9 g/dL (ref 6.5–8.1)

## 2020-01-30 MED ORDER — SODIUM CHLORIDE 0.9 % IV SOLN
600.0000 mg/m2 | Freq: Once | INTRAVENOUS | Status: AC
  Administered 2020-01-30: 11:00:00 1292 mg via INTRAVENOUS
  Filled 2020-01-30: qty 33.98

## 2020-01-30 MED ORDER — SODIUM CHLORIDE 0.9 % IV SOLN
Freq: Once | INTRAVENOUS | Status: AC
  Filled 2020-01-30: qty 250

## 2020-01-30 MED ORDER — PROCHLORPERAZINE MALEATE 10 MG PO TABS
10.0000 mg | ORAL_TABLET | Freq: Once | ORAL | Status: AC
  Administered 2020-01-30: 10 mg via ORAL

## 2020-01-30 MED ORDER — PROCHLORPERAZINE MALEATE 10 MG PO TABS
ORAL_TABLET | ORAL | Status: AC
  Filled 2020-01-30: qty 1

## 2020-01-30 MED ORDER — HEPARIN SOD (PORK) LOCK FLUSH 100 UNIT/ML IV SOLN
500.0000 [IU] | Freq: Once | INTRAVENOUS | Status: AC | PRN
  Administered 2020-01-30: 500 [IU]
  Filled 2020-01-30: qty 5

## 2020-01-30 MED ORDER — SODIUM CHLORIDE 0.9% FLUSH
10.0000 mL | Freq: Once | INTRAVENOUS | Status: AC
  Administered 2020-01-30: 10 mL
  Filled 2020-01-30: qty 10

## 2020-01-30 MED ORDER — SODIUM CHLORIDE 0.9% FLUSH
10.0000 mL | INTRAVENOUS | Status: DC | PRN
  Administered 2020-01-30: 10 mL
  Filled 2020-01-30: qty 10

## 2020-01-30 NOTE — Patient Instructions (Signed)
[facility] °Discharge Instructions for Patients Receiving Chemotherapy ° °Today you received the following chemotherapy agents Gemzar ° °To help prevent nausea and vomiting after your treatment, we encourage you to take your nausea medication as directed. °  °If you develop nausea and vomiting that is not controlled by your nausea medication, call the clinic.  ° °BELOW ARE SYMPTOMS THAT SHOULD BE REPORTED IMMEDIATELY: °· *FEVER GREATER THAN 100.5 F °· *CHILLS WITH OR WITHOUT FEVER °· NAUSEA AND VOMITING THAT IS NOT CONTROLLED WITH YOUR NAUSEA MEDICATION °· *UNUSUAL SHORTNESS OF BREATH °· *UNUSUAL BRUISING OR BLEEDING °· TENDERNESS IN MOUTH AND THROAT WITH OR WITHOUT PRESENCE OF ULCERS °· *URINARY PROBLEMS °· *BOWEL PROBLEMS °· UNUSUAL RASH °Items with * indicate a potential emergency and should be followed up as soon as possible. ° °Feel free to call the clinic should you have any questions or concerns. The clinic phone number is [phone]. ° °Please show the CHEMO ALERT CARD at check-in to the Emergency Department and triage nurse. ° ° °

## 2020-02-13 ENCOUNTER — Encounter: Payer: Self-pay | Admitting: Hematology and Oncology

## 2020-02-13 ENCOUNTER — Inpatient Hospital Stay: Payer: Medicare (Managed Care) | Attending: Hematology and Oncology

## 2020-02-13 ENCOUNTER — Inpatient Hospital Stay: Payer: Medicare (Managed Care)

## 2020-02-13 ENCOUNTER — Inpatient Hospital Stay (HOSPITAL_BASED_OUTPATIENT_CLINIC_OR_DEPARTMENT_OTHER): Payer: Medicare (Managed Care) | Admitting: Hematology and Oncology

## 2020-02-13 ENCOUNTER — Other Ambulatory Visit: Payer: Self-pay

## 2020-02-13 DIAGNOSIS — Z90722 Acquired absence of ovaries, bilateral: Secondary | ICD-10-CM | POA: Insufficient documentation

## 2020-02-13 DIAGNOSIS — C562 Malignant neoplasm of left ovary: Secondary | ICD-10-CM

## 2020-02-13 DIAGNOSIS — Z90710 Acquired absence of both cervix and uterus: Secondary | ICD-10-CM | POA: Diagnosis not present

## 2020-02-13 DIAGNOSIS — C563 Malignant neoplasm of bilateral ovaries: Secondary | ICD-10-CM

## 2020-02-13 DIAGNOSIS — D61818 Other pancytopenia: Secondary | ICD-10-CM | POA: Insufficient documentation

## 2020-02-13 DIAGNOSIS — C561 Malignant neoplasm of right ovary: Secondary | ICD-10-CM | POA: Insufficient documentation

## 2020-02-13 DIAGNOSIS — C786 Secondary malignant neoplasm of retroperitoneum and peritoneum: Secondary | ICD-10-CM | POA: Diagnosis not present

## 2020-02-13 DIAGNOSIS — Z8673 Personal history of transient ischemic attack (TIA), and cerebral infarction without residual deficits: Secondary | ICD-10-CM | POA: Insufficient documentation

## 2020-02-13 DIAGNOSIS — R4702 Dysphasia: Secondary | ICD-10-CM | POA: Diagnosis not present

## 2020-02-13 DIAGNOSIS — Z7901 Long term (current) use of anticoagulants: Secondary | ICD-10-CM | POA: Insufficient documentation

## 2020-02-13 DIAGNOSIS — R918 Other nonspecific abnormal finding of lung field: Secondary | ICD-10-CM | POA: Diagnosis not present

## 2020-02-13 DIAGNOSIS — Z5111 Encounter for antineoplastic chemotherapy: Secondary | ICD-10-CM | POA: Insufficient documentation

## 2020-02-13 DIAGNOSIS — C800 Disseminated malignant neoplasm, unspecified: Secondary | ICD-10-CM

## 2020-02-13 DIAGNOSIS — Z7189 Other specified counseling: Secondary | ICD-10-CM

## 2020-02-13 DIAGNOSIS — Z79899 Other long term (current) drug therapy: Secondary | ICD-10-CM | POA: Insufficient documentation

## 2020-02-13 DIAGNOSIS — R1310 Dysphagia, unspecified: Secondary | ICD-10-CM | POA: Diagnosis not present

## 2020-02-13 LAB — CMP (CANCER CENTER ONLY)
ALT: 21 U/L (ref 0–44)
AST: 18 U/L (ref 15–41)
Albumin: 3.5 g/dL (ref 3.5–5.0)
Alkaline Phosphatase: 111 U/L (ref 38–126)
Anion gap: 9 (ref 5–15)
BUN: 11 mg/dL (ref 8–23)
CO2: 28 mmol/L (ref 22–32)
Calcium: 9.2 mg/dL (ref 8.9–10.3)
Chloride: 105 mmol/L (ref 98–111)
Creatinine: 0.76 mg/dL (ref 0.44–1.00)
GFR, Est AFR Am: >60 mL/min (ref >60)
GFR, Estimated: >60 mL/min (ref >60)
Glucose, Bld: 93 mg/dL (ref 70–99)
Potassium: 3.7 mmol/L (ref 3.5–5.1)
Sodium: 142 mmol/L (ref 135–145)
Total Bilirubin: 0.5 mg/dL (ref 0.3–1.2)
Total Protein: 7.1 g/dL (ref 6.5–8.1)

## 2020-02-13 LAB — CBC WITH DIFFERENTIAL (CANCER CENTER ONLY)
Abs Immature Granulocytes: 0.01 10*3/uL (ref 0.00–0.07)
Basophils Absolute: 0 10*3/uL (ref 0.0–0.1)
Basophils Relative: 0 %
Eosinophils Absolute: 0 10*3/uL (ref 0.0–0.5)
Eosinophils Relative: 1 %
HCT: 35.1 % — ABNORMAL LOW (ref 36.0–46.0)
Hemoglobin: 11.5 g/dL — ABNORMAL LOW (ref 12.0–15.0)
Immature Granulocytes: 0 %
Lymphocytes Relative: 23 %
Lymphs Abs: 1.2 10*3/uL (ref 0.7–4.0)
MCH: 37.2 pg — ABNORMAL HIGH (ref 26.0–34.0)
MCHC: 32.8 g/dL (ref 30.0–36.0)
MCV: 113.6 fL — ABNORMAL HIGH (ref 80.0–100.0)
Monocytes Absolute: 0.6 10*3/uL (ref 0.1–1.0)
Monocytes Relative: 12 %
Neutro Abs: 3.2 10*3/uL (ref 1.7–7.7)
Neutrophils Relative %: 64 %
Platelet Count: 134 10*3/uL — ABNORMAL LOW (ref 150–400)
RBC: 3.09 MIL/uL — ABNORMAL LOW (ref 3.87–5.11)
RDW: 14.9 % (ref 11.5–15.5)
WBC Count: 5 10*3/uL (ref 4.0–10.5)
nRBC: 0 % (ref 0.0–0.2)

## 2020-02-13 MED ORDER — SODIUM CHLORIDE 0.9 % IV SOLN
Freq: Once | INTRAVENOUS | Status: AC
  Filled 2020-02-13: qty 250

## 2020-02-13 MED ORDER — PROCHLORPERAZINE MALEATE 10 MG PO TABS
ORAL_TABLET | ORAL | Status: AC
  Filled 2020-02-13: qty 1

## 2020-02-13 MED ORDER — LIDOCAINE-PRILOCAINE 2.5-2.5 % EX CREA: TOPICAL_CREAM | CUTANEOUS | 1 refills | Status: DC

## 2020-02-13 MED ORDER — PROCHLORPERAZINE MALEATE 10 MG PO TABS
10.0000 mg | ORAL_TABLET | Freq: Once | ORAL | Status: AC
  Administered 2020-02-13: 10 mg via ORAL

## 2020-02-13 MED ORDER — HEPARIN SOD (PORK) LOCK FLUSH 100 UNIT/ML IV SOLN
500.0000 [IU] | Freq: Once | INTRAVENOUS | Status: AC | PRN
  Administered 2020-02-13: 500 [IU]
  Filled 2020-02-13: qty 5

## 2020-02-13 MED ORDER — SODIUM CHLORIDE 0.9 % IV SOLN
600.0000 mg/m2 | Freq: Once | INTRAVENOUS | Status: AC
  Administered 2020-02-13: 1292 mg via INTRAVENOUS
  Filled 2020-02-13: qty 33.98

## 2020-02-13 MED ORDER — SODIUM CHLORIDE 0.9% FLUSH
10.0000 mL | INTRAVENOUS | Status: DC | PRN
  Administered 2020-02-13: 10 mL
  Filled 2020-02-13: qty 10

## 2020-02-13 NOTE — Patient Instructions (Signed)
[facility] °Discharge Instructions for Patients Receiving Chemotherapy ° °Today you received the following chemotherapy agents Gemzar ° °To help prevent nausea and vomiting after your treatment, we encourage you to take your nausea medication as directed. °  °If you develop nausea and vomiting that is not controlled by your nausea medication, call the clinic.  ° °BELOW ARE SYMPTOMS THAT SHOULD BE REPORTED IMMEDIATELY: °· *FEVER GREATER THAN 100.5 F °· *CHILLS WITH OR WITHOUT FEVER °· NAUSEA AND VOMITING THAT IS NOT CONTROLLED WITH YOUR NAUSEA MEDICATION °· *UNUSUAL SHORTNESS OF BREATH °· *UNUSUAL BRUISING OR BLEEDING °· TENDERNESS IN MOUTH AND THROAT WITH OR WITHOUT PRESENCE OF ULCERS °· *URINARY PROBLEMS °· *BOWEL PROBLEMS °· UNUSUAL RASH °Items with * indicate a potential emergency and should be followed up as soon as possible. ° °Feel free to call the clinic should you have any questions or concerns. The clinic phone number is [phone]. ° °Please show the CHEMO ALERT CARD at check-in to the Emergency Department and triage nurse. ° ° °

## 2020-02-13 NOTE — Assessment & Plan Note (Addendum)
She tolerated treatment well without major side effects We will continue treatment every other week She is due for imaging study at the end of the month I recommend her to get CT imaging done on March 22 and we will resume review results on the 23rd If she has positive response to treatment, we will continue treatment every other week

## 2020-02-14 ENCOUNTER — Encounter: Payer: Self-pay | Admitting: Hematology and Oncology

## 2020-02-14 NOTE — Progress Notes (Signed)
[place] OFFICE PROGRESS NOTE  Patient Care Team: Helane Rima, MD as PCP - General (Family Medicine) Encarnacion Slates, MD as Referring Physician (Neurology) Cameron Sprang, MD as Consulting Physician (Neurology)  ASSESSMENT & PLAN:  Ovarian cancer [facility]) She tolerated treatment well without major side effects We will continue treatment every other week She is due for imaging study at the end of the month I recommend her to get CT imaging done on March 22 and we will resume review results on the 23rd If she has positive response to treatment, we will continue treatment every other week   Pancytopenia, acquired [facility]) She has consistent pancytopenia with each cycle of treatment We will proceed with treatment today She does not need transfusion support  Expressive dysphasia She has expressive dysphagia from prior history of stroke I recommend her daughter to come with her for doctor's visit appointment in the future to facilitate accurate communication Her daughter is available on the phone and we reviewed the plan of care today   Orders Placed This Encounter  Procedures  . CT ABDOMEN PELVIS W CONTRAST    Standing Status:   Future    Standing Expiration Date:   02/12/2021    Order Specific Question:   If indicated for the ordered procedure, I authorize the administration of contrast media per Radiology protocol    Answer:   Yes    Order Specific Question:   Preferred imaging location?    Answer:   [facility]    Order Specific Question:   Radiology Contrast Protocol - do NOT remove file path    Answer:   [email or web address]    All questions were answered. The patient knows to call the clinic with any problems, questions or concerns. The total time spent in the appointment was 20 minutes encounter with patients including review of chart and various tests results, discussions about plan of care and coordination of care plan   Heath Lark, MD 02/14/2020 9:12 AM  INTERVAL HISTORY: Please see below for problem oriented charting. She returns for chemotherapy and follow-up She is doing well otherwise Denies pain No recent nausea or changes in bowel habits I also spoke with her daughter over the phone  SUMMARY OF ONCOLOGIC HISTORY: Oncology History Overview Note  Negative genetic testing Progressed on Niraparib   Carcinomatosis ([place])  12/06/2015 Initial Diagnosis   Carcinomatosis ([place])   Ovarian cancer, bilateral ([place])  12/24/2015 Initial Diagnosis   Ovarian cancer, bilateral ([place])   Ovarian cancer ([place])  11/28/2015 Imaging   Ct scan of abdomen: Peritoneal carcinomatosis and pelvic/inguinal adenopathy. Gynecologic primary is favored.   12/17/2015 Pathology Results   Lymph node, needle/core biopsy, L inguinal LAN - METASTATIC ADENOCARCINOMA. Microscopic Comment Immunohistochemistry will be performed and reported as an addendum. (JDP:ecj 12/18/2015) ADDENDUM: Immunohistochemistry shows strong positivity with cytokeratin 7, estrogen receptor (ER), progesterone receptor (PR), p53 and WT1. Negative markers are cytokeratin 20 , CDX- 2 and gross cystic disease fluid protein. 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 (JDP:kh 12-19-15   12/17/2015 Procedure   Technically successful ultrasound guided core left inguinal adenopathy biopsy   12/24/2015 Tumor Marker   Patient's tumor was tested for the following markers: CA-125 Results of the tumor marker test revealed 608.2   12/27/2015 - 02/28/2016 Chemotherapy   She received neoadjuvant chemo x 3 cycles   01/01/2016 Tumor Marker   Patient's tumor was tested for the following  markers: CA-125 Results of the tumor marker test revealed 741.6   01/29/2016 Procedure   Successful placement of a right internal jugular approach power injectable Port-A-Cath. The catheter is ready  for immediate use   01/29/2016 Imaging   US abdomen 1. Hyperechoic 2.5 x 1.0 x 1.6 cm lesion in the region the caudate lobe of the liver. Similar finding noted on prior CT of 11/28/2015. This could represent hemangioma. This could represent a malignancy including metastatic disease. 2. Exam otherwise unremarkable. No gallstones. No biliary distention.   02/03/2016 Tumor Marker   Patient's tumor was tested for the following markers: CA-125 Results of the tumor marker test revealed 137.1   02/20/2016 Imaging   MRI brain No acute infarct.  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  No intracranial mass.  Moderate small vessel disease changes.  Global atrophy without hydrocephalus.  Expanded partially empty sella without secondary findings of pseudotumor cerebri.  Minimal mucosal thickening ethmoid sinus air cells. Small air-fluid levels maxillary sinuses bilaterally may indicate changes of acute sinusitis.    02/28/2016 Tumor Marker   Patient's tumor was tested for the following markers: CA-125 Results of the tumor marker test revealed 33.6   03/02/2016 Imaging   CT chest, abdomen and pelvis 1. Today's study demonstrates a positive response to therapy with resolution of the previously noted malignant ascites, significant regression of previously noted peritoneal implants, and regression of previously noted lymphadenopathy in the abdomen and pelvis. 2. No definite evidence to suggest metastatic disease to the thorax. 3. Stable 1.0 x 1.7 cm intermediate attenuation lesion associated with the posterior aspect of segment 1 of the liver, favored to represent a mildly proteinaceous hepatic cyst. The possibility of a peritoneal implant in this region is not entirely excluded, but is not favored on today's examination. 4. Extensive post infectious scarring inguinal upper right lung, likely sequela of prior necrotizing pneumonia. 5. Multiple tiny pulmonary nodules  scattered throughout the lungs bilaterally all measuring 4 mm or less. These are nonspecific, but favored to be benign. Given the patient's history of primary gynecologic malignancy, attention on followup studies is recommended to ensure the stability of these findings. 6. Additional incidental findings, as above   03/14/2016 Imaging   CT angiogram 1. No pulmonary emboli. 2. Chronic changes to the right upper lobe. 3. Stable lesion in the caudate lobe of the liver. 4. Stable soft tissue prominence to the right of the trachea as described above. 5. Stable small nodule in the right lung.    03/24/2016 Pathology Results   1. Omentum, resection for tumor - FOCAL ADENOCARCINOMA ASSOCIATED WITH EXTENSIVE FIBROSIS, INFLAMMATION AND HEMOSIDERIN DEPOSITION. 2. Uterus +/- tubes/ovaries, neoplastic, cervix - CERVIX: SLIGHT CERVICITIS AND SQUAMOUS METAPLASIA. - ENDOMETRIUM: ATROPHIC, NO HYPERPLASIA OR MALIGNANCY. - MYOMETRIUM: LEIOMYOMATA WITH DEGENERATIVE CHANGES. NO MALIGNANCY. - UTERINE SEROSA: FOCAL ADENOCARCINOMA ASSOCIATED WITH ADHESIONS. - RIGHT OVARY: BENIGN CALCIFIED NODULE AND BENIGN SEROUS CYST. NO MALIGNANCY IDENTIFIED. - RIGHT FALLOPIAN TUBE: UNREMARKABLE. NO MALIGNANCY IDENTIFIED. - LEFT OVARY: FOCAL ADENOCARCINOMA ASSOCIATED WITH EXTENSIVE FIBROSIS. - LEFT FALLOPIAN TUBE: HYDROSALPINX. NO MALIGNANCY IDENTIFIED. 3. Lymph node, biopsy, right external iliac - METASTATIC ADENOCARCINOMA, 4. Soft tissue, biopsy, left para colic gutter - FOCAL ADENOCARCINOMA ASSOCIATED WITH FIBROSIS. Microscopic Comment 2. OVARY Specimen(s): Uterus with bilateral ovaries, omentum, right external iliac lymph node and left paracolic gutter. Procedure: (including lymph node sampling) Hysterectomy with bilateral salpingo-oophorectomy, omentectomy, lymph node biopsy and paracolic gutter biopsy. Primary tumor site (including laterality): Left ovary. Ovarian surface involvement: Yes Ovarian capsule  intact  without fragmentation: N/A Maximum tumor size (cm): 2 cm, 2 cm Histologic type: Serous carcinoma. Grade: 3 Peritoneal implants: (specify invasive or non-invasive): Left paracolic gutter positive for carcinoma Pelvic extension (list additional structures on separate lines and if involved): Omentum, right paracolic gutter and uterine serosa. Lymph nodes: number examined 1 ; number positive 1 TNM code: ypT3a, ypN1b, ypMX FIGO Stage (based on pathologic findings, needs clinical correlation): IIIA2 Comments: The left ovary has a 2 cm nodule, which is composed primarily of fibrous tissue with small foci of residual adenocarcinoma. There is also microscopic involvement of the uterine serosa, the left paracolic gutter biopsy and the omentum. The left paracolic gutter and omental involvement is microscopic and both are associated with extensive fibrosis with focal hemosiderin deposition. The right external iliac node is completely replaced by metastatic adenocarcinoma with serous features. (JDP:ecj 03/30/2016)   03/24/2016 Surgery   Operation: Robotic-assisted laparoscopic total hysterectomy with bilateral salpingoophorectomy, omentectomy, radical tumor debulking  Surgeon: Donaciano Eva  Operative Findings:  : small nodules in omentum, no ascites. Omental nodule (2cm) adherent to lower anterior abdominal wall. 2cm left colonic gutter cystic nodule. 6cm right external iliac lymph node. Grossly normal ovaries and tubes. Fibroid uterus. No gross residual disease at completion of surgery representing R0 optimal/complete resection.    04/17/2016 Imaging   CT abdomen and pelvis 1. There is no evidence of acute inflammatory process within abdomen or pelvis. 2. Stable low-density lesion within caudate lobe of the liver. No new hepatic lesions are noted. 3. Again noted lobulated renal contour and multifocal renal cortical scarring. No hydronephrosis or hydroureter. 4. No significant mesenteric  adenopathy. No retroperitoneal adenopathy. Stable minimal residual peritoneal thickening within pelvis. No new pelvic implants or pelvic ascites. 5. Status post hysterectomy.  Unremarkable urinary bladder. 6. Moderate stool within colon as described above. No evidence of colitis.    04/24/2016 - 06/19/2016 Chemotherapy   She received 3 more cycles of chemo after surgery   05/14/2016 Tumor Marker   Patient's tumor was tested for the following markers: CA-125 Results of the tumor marker test revealed 22.5   06/04/2016 Tumor Marker   Patient's tumor was tested for the following markers: CA-125 Results of the tumor marker test revealed 13.7   07/07/2016 Genetic Testing   Patient has genetic testing done for breast/ovasrian cancer panel Results revealed patient has no actionable mutation   07/23/2016 Imaging   Ct abdomen and pelvis 1. Heterogeneously enhancing 4.9 x 3.2 x 4.4 cm mass along the right pelvic sidewall may represent locally recurrent disease or malignant lymphadenopathy. 2. No other signs of definite metastatic disease noted elsewhere in the abdomen or pelvis. 3. Stable low-attenuation hepatic lesion in the caudate lobe of the liver, which appears to demonstrates some progressive centripetal filling on delayed images. This lesion is presumably benign given its stability compared to prior studies, and is favored to represent a small cavernous hemangioma. 4. Cardiomegaly with biatrial dilatation, which is very severe on the right side. 5. Aortic atherosclerosis. 6. Additional incidental findings, as above.   07/23/2016 Tumor Marker   Patient's tumor was tested for the following markers: CA-125 Results of the tumor marker test revealed 12.3   09/30/2016 Imaging   Ct abdomen and pelvis: 1. 4.9 cm heterogeneously enhancing mass seen along the right pelvic sidewall previously has almost completely resolved. There is some ill-defined soft tissue attenuation/peritoneal thickening in this  region today but no discrete measurable lesion is evident. 2. No new or progressive findings  on today's exam. 3. Stable hypo attenuating lesion in the caudate lobe of the liver. 4. Subtle aortic atherosclerosis   09/30/2016 Tumor Marker   Patient's tumor was tested for the following markers: CA-125 Results of the tumor marker test revealed 9.6   10/05/2016 Pathology Results   Vagina, biopsy, left cuff - BENIGN FIBROEPITHELIAL (STROMAL) POLYP. - NO DYSPLASIA, ATYPIA OR MALIGNANCY IDENTIFIED.   01/14/2017 Tumor Marker   Patient's tumor was tested for the following markers: CA-125 Results of the tumor marker test revealed 11.9   05/05/2017 Tumor Marker   Patient's tumor was tested for the following markers: CA-125 Results of the tumor marker test revealed 28   06/14/2017 Tumor Marker   Patient's tumor was tested for the following markers: CA-125 Results of the tumor marker test revealed 45.7   06/24/2017 Imaging   Ct abdomen and pelvis: Increased peritoneal metastatic disease in abdomen and pelvis since prior exam. No evidence of ascites. Stable small benign hepatic hemangioma.   07/21/2017 Tumor Marker   Patient's tumor was tested for the following markers: CA-125 Results of the tumor marker test revealed 84.1   07/21/2017 - 11/03/2017 Chemotherapy   She received carboplatin and taxol   08/11/2017 Adverse Reaction   She had severe neuropathy due to treatment. Does of chemo is reduced starting cycle 2 onwards   09/02/2017 Tumor Marker   Patient's tumor was tested for the following markers: CA-125 Results of the tumor marker test revealed 27.7   09/16/2017 Imaging   CT CHEST IMPRESSION  1. No acute process or evidence of metastatic disease in the chest. 2. Right upper lung bronchiectasis, consolidation, and architectural distortion are most likely post infectious or inflammatory and not significantly changed. 3. Aortic Atherosclerosis (ICD10-I70.0).  CT ABDOMEN AND PELVIS  IMPRESSION  1. Response to therapy of nodal and peritoneal metastasis. 2. No new or progressive disease. 3. Caudate lobe hemangioma, as before. 4. Bilateral renal cortical thinning. Similar minimal right-sided caliectasis and hydroureter. Likely secondary to low-grade obstruction by the dominant right pelvic implant.   09/22/2017 Tumor Marker   Patient's tumor was tested for the following markers: CA-125 Results of the tumor marker test revealed 18   11/03/2017 Tumor Marker   Patient's tumor was tested for the following markers: CA-125 Results of the tumor marker test revealed 13.4   11/25/2017 Imaging   1. Stable to slight interval decrease in size of nodal and peritoneal metastatic disease within the abdomen. 2. No new or progressive disease.   12/03/2017 - 09/11/2019 Chemotherapy   The patient is prescribed Ze[place]ula   01/17/2018 Tumor Marker   Patient's tumor was tested for the following markers: CA-125 Results of the tumor marker test revealed 11.9   02/22/2018 Tumor Marker   Patient's tumor was tested for the following markers: CA-125 Results of the tumor marker test revealed 11.7   02/22/2018 Imaging   1. Improved nodular thickening along the vaginal cuff on the left and medial to the right iliac vessels, likely treated implants. No evidence of progressive peritoneal disease. 2. Stable hepatic hemangioma. No evidence of solid visceral organ metastases. 3. Bilateral renal cortical scarring.   05/19/2018 Tumor Marker   Patient's tumor was tested for the following markers: CA-125 Results of the tumor marker test revealed 9.9   08/10/2018 Imaging   Stable mild nodular thickening of the left vaginal cuff. No new or progressive disease within the abdomen or pelvis.  Stable small hepatic hemangioma.   09/21/2018 Tumor Marker   Patient's tumor was  tested for the following markers: CA-125 Results of the tumor marker test revealed 10.3   11/11/2018 Tumor Marker   Patient's tumor  was tested for the following markers: CA-125 Results of the tumor marker test revealed 10.5   12/15/2018 Tumor Marker   Patient's tumor was tested for the following markers: CA-125 Results of the tumor marker test revealed 12.6   01/06/2019 Tumor Marker   Patient's tumor was tested for the following markers: CA-125 Results of the tumor marker test revealed 14.6   01/12/2019 Imaging   1. Chronic extensive right upper lobe scarring changes and bronchiectasis. 2. No mediastinal or hilar mass or adenopathy and no findings for pulmonary metastatic disease. 3. Stable caudate lobe hemangioma. 4. No worrisome omental or peritoneal surface lesions. 5. Stable pericecal and small pelvic lymph nodes. No new or progressive findings.    09/11/2019 Imaging   Ct abdomen and pelvis Pericecal peritoneal metastatic implants   09/19/2019 -  Chemotherapy   The patient had carboplatin and gemxar for chemotherapy treatment.     11/20/2019 Imaging   1. Mild decrease in size of peritoneal tumor implant along the lateral aspect of the cecum. 2. Stable tiny sub-cm mesenteric soft tissue nodules or lymph nodes in right lower quadrant and anterior pelvic mesentery. 3. No new or progressive disease identified within the abdomen or pelvis.     REVIEW OF SYSTEMS:   Constitutional: Denies fevers, chills or abnormal weight loss Eyes: Denies blurriness of vision Ears, nose, mouth, throat, and face: Denies mucositis or sore throat Respiratory: Denies cough, dyspnea or wheezes Cardiovascular: Denies palpitation, chest discomfort or lower extremity swelling Gastrointestinal:  Denies nausea, heartburn or change in bowel habits Skin: Denies abnormal skin rashes Lymphatics: Denies new lymphadenopathy or easy bruising Neurological:Denies numbness, tingling or new weaknesses Behavioral/Psych: Mood is stable, no new changes  All other systems were reviewed with the patient and are negative.  I have reviewed the past  medical history, past surgical history, social history and family history with the patient and they are unchanged from previous note.  ALLERGIES:  is allergic to codeine; lactulose; and latex.  MEDICATIONS:  Current Outpatient Medications  Medication Sig Dispense Refill  . apixaban (ELIQUIS) 5 MG TABS tablet Take 1 tablet (5 mg total) by mouth 2 (two) times daily. 60 tablet 5  . atorvastatin (LIPITOR) 40 MG tablet Take 40 mg by mouth at bedtime.    . gabapentin (NEURONTIN) 600 MG tablet Take 1 tablet (600 mg total) by mouth 2 (two) times daily. 60 tablet 11  . lidocaine-prilocaine (EMLA) cream Apply to Porta-Cath 1-2 hours prior to access as directed. 30 g 1  . metoprolol succinate (TOPROL-XL) 25 MG 24 hr tablet Take 25 mg by mouth daily.   5  . Multiple Vitamin (MULTIVITAMIN WITH MINERALS) TABS tablet Take 1 tablet by mouth daily.    Marland Kitchen omega-3 acid ethyl esters (LOVAZA) 1 g capsule Take 1 g by mouth daily.    . ondansetron (ZOFRAN ODT) 4 MG disintegrating tablet Take 1 tablet (4 mg total) by mouth every 8 (eight) hours as needed for nausea or vomiting. 60 tablet 9  . oxyCODONE (OXY IR/ROXICODONE) 5 MG immediate release tablet Take 1 tablet (5 mg total) by mouth every 6 (six) hours as needed for severe pain. 60 tablet 0  . PARoxetine (PAXIL) 40 MG tablet Take 40 mg by mouth daily.  0  . polyethylene glycol (MIRALAX / GLYCOLAX) packet Take 17 g by mouth daily.    Marland Kitchen  verapamil (CALAN-SR) 240 MG CR tablet Take 1 tablet (240 mg total) by mouth daily. 30 tablet 0   No current facility-administered medications for this visit.    PHYSICAL EXAMINATION: ECOG PERFORMANCE STATUS: 2 - Symptomatic, <50% confined to bed  Vitals:   02/13/20 1122  BP: (!) 155/87  Pulse: 72  Resp: 17  Temp: 98.5 F (36.9 C)  SpO2: 98%   Filed Weights   02/13/20 1122  Weight: 202 lb 12.8 oz (92 kg)    GENERAL:alert, no distress and comfortable NEURO: alert & oriented x 3 with expressive dysphagia and  hemiparesis, unchanged  LABORATORY DATA:  I have reviewed the data as listed    Component Value Date/Time   NA 142 02/13/2020 1105   NA 142 12/08/2017 1051   K 3.7 02/13/2020 1105   K 4.4 12/08/2017 1051   CL 105 02/13/2020 1105   CL 105 06/14/2017 1430   CO2 28 02/13/2020 1105   CO2 28 12/08/2017 1051   GLUCOSE 93 02/13/2020 1105   GLUCOSE 97 12/08/2017 1051   BUN 11 02/13/2020 1105   BUN 16.2 12/08/2017 1051   CREATININE 0.76 02/13/2020 1105   CREATININE 0.9 12/08/2017 1051   CALCIUM 9.2 02/13/2020 1105   CALCIUM 9.5 12/08/2017 1051   PROT 7.1 02/13/2020 1105   PROT 6.9 12/08/2017 1051   ALBUMIN 3.5 02/13/2020 1105   ALBUMIN 3.6 12/08/2017 1051   AST 18 02/13/2020 1105   AST 18 12/08/2017 1051   ALT 21 02/13/2020 1105   ALT 20 12/08/2017 1051   ALKPHOS 111 02/13/2020 1105   ALKPHOS 131 12/08/2017 1051   BILITOT 0.5 02/13/2020 1105   BILITOT 0.70 12/08/2017 1051   GFRNONAA >60 02/13/2020 1105   GFRAA >60 02/13/2020 1105    No results found for: SPEP, UPEP  Lab Results  Component Value Date   WBC 5.0 02/13/2020   NEUTROABS 3.2 02/13/2020   HGB 11.5 (L) 02/13/2020   HCT 35.1 (L) 02/13/2020   MCV 113.6 (H) 02/13/2020   PLT 134 (L) 02/13/2020      Chemistry      Component Value Date/Time   NA 142 02/13/2020 1105   NA 142 12/08/2017 1051   K 3.7 02/13/2020 1105   K 4.4 12/08/2017 1051   CL 105 02/13/2020 1105   CL 105 06/14/2017 1430   CO2 28 02/13/2020 1105   CO2 28 12/08/2017 1051   BUN 11 02/13/2020 1105   BUN 16.2 12/08/2017 1051   CREATININE 0.76 02/13/2020 1105   CREATININE 0.9 12/08/2017 1051      Component Value Date/Time   CALCIUM 9.2 02/13/2020 1105   CALCIUM 9.5 12/08/2017 1051   ALKPHOS 111 02/13/2020 1105   ALKPHOS 131 12/08/2017 1051   AST 18 02/13/2020 1105   AST 18 12/08/2017 1051   ALT 21 02/13/2020 1105   ALT 20 12/08/2017 1051   BILITOT 0.5 02/13/2020 1105   BILITOT 0.70 12/08/2017 1051

## 2020-02-14 NOTE — Assessment & Plan Note (Signed)
She has expressive dysphagia from prior history of stroke I recommend her daughter to come with her for doctor's visit appointment in the future to facilitate accurate communication Her daughter is available on the phone and we reviewed the plan of care today

## 2020-02-14 NOTE — Assessment & Plan Note (Signed)
She has consistent pancytopenia with each cycle of treatment We will proceed with treatment today She does not need transfusion support 

## 2020-02-16 ENCOUNTER — Telehealth: Payer: Self-pay | Admitting: Hematology and Oncology

## 2020-02-16 ENCOUNTER — Telehealth: Payer: Self-pay

## 2020-02-16 NOTE — Telephone Encounter (Signed)
Scheduled appt per 3/10 sch message - unable to reach pt daughter and unable to leave message . Message sent to RN

## 2020-02-16 NOTE — Telephone Encounter (Signed)
Called and given appt times for 3/23 to daughter. She verbalized understanding.

## 2020-02-26 ENCOUNTER — Ambulatory Visit (HOSPITAL_COMMUNITY): Payer: Medicare (Managed Care)

## 2020-02-26 ENCOUNTER — Telehealth: Payer: Self-pay

## 2020-02-26 NOTE — Telephone Encounter (Signed)
Scheuduling message sent to reschedule 3/23 appt to 4/8.

## 2020-02-26 NOTE — Telephone Encounter (Signed)
Called daughter regarding CT scan. They will be unable to do CT scab today. Given radiology scheduling #, daughter will call and schedule CT. Appointments for tomorrow will be rescheduled to after scan.

## 2020-02-27 ENCOUNTER — Inpatient Hospital Stay: Payer: Medicare (Managed Care)

## 2020-02-27 ENCOUNTER — Inpatient Hospital Stay: Payer: Medicare (Managed Care) | Admitting: Hematology and Oncology

## 2020-03-07 NOTE — Progress Notes (Signed)

## 2020-03-12 ENCOUNTER — Encounter (HOSPITAL_COMMUNITY): Payer: Self-pay

## 2020-03-12 ENCOUNTER — Other Ambulatory Visit: Payer: Self-pay

## 2020-03-12 ENCOUNTER — Ambulatory Visit (HOSPITAL_COMMUNITY)
Admission: RE | Admit: 2020-03-12 | Discharge: 2020-03-12 | Disposition: A | Discharge status: Home / Self Care | Payer: Medicare (Managed Care) | Source: Ambulatory Visit | Attending: Hematology and Oncology | Admitting: Hematology and Oncology

## 2020-03-12 DIAGNOSIS — C563 Malignant neoplasm of bilateral ovaries: Secondary | ICD-10-CM

## 2020-03-12 DIAGNOSIS — C561 Malignant neoplasm of right ovary: Secondary | ICD-10-CM | POA: Diagnosis not present

## 2020-03-12 DIAGNOSIS — C562 Malignant neoplasm of left ovary: Secondary | ICD-10-CM | POA: Diagnosis present

## 2020-03-12 MED ORDER — SODIUM CHLORIDE (PF) 0.9 % IJ SOLN
INTRAMUSCULAR | Status: AC
  Filled 2020-03-12: qty 50

## 2020-03-12 MED ORDER — IOHEXOL 300 MG/ML  SOLN
100.0000 mL | Freq: Once | INTRAMUSCULAR | Status: AC | PRN
  Administered 2020-03-12: 09:00:00 100 mL via INTRAVENOUS

## 2020-03-13 ENCOUNTER — Ambulatory Visit: Payer: Self-pay | Admitting: Neurology

## 2020-03-14 ENCOUNTER — Inpatient Hospital Stay: Payer: Medicare (Managed Care)

## 2020-03-14 ENCOUNTER — Inpatient Hospital Stay: Payer: Medicare (Managed Care) | Attending: Hematology and Oncology

## 2020-03-14 ENCOUNTER — Other Ambulatory Visit: Payer: Self-pay

## 2020-03-14 ENCOUNTER — Encounter: Payer: Self-pay | Admitting: Hematology and Oncology

## 2020-03-14 ENCOUNTER — Inpatient Hospital Stay (HOSPITAL_BASED_OUTPATIENT_CLINIC_OR_DEPARTMENT_OTHER): Payer: Medicare (Managed Care) | Admitting: Hematology and Oncology

## 2020-03-14 DIAGNOSIS — C562 Malignant neoplasm of left ovary: Secondary | ICD-10-CM

## 2020-03-14 DIAGNOSIS — R110 Nausea: Secondary | ICD-10-CM | POA: Diagnosis not present

## 2020-03-14 DIAGNOSIS — C563 Malignant neoplasm of bilateral ovaries: Secondary | ICD-10-CM

## 2020-03-14 DIAGNOSIS — C539 Malignant neoplasm of cervix uteri, unspecified: Secondary | ICD-10-CM | POA: Insufficient documentation

## 2020-03-14 DIAGNOSIS — Z7901 Long term (current) use of anticoagulants: Secondary | ICD-10-CM | POA: Insufficient documentation

## 2020-03-14 DIAGNOSIS — C786 Secondary malignant neoplasm of retroperitoneum and peritoneum: Secondary | ICD-10-CM | POA: Insufficient documentation

## 2020-03-14 DIAGNOSIS — D638 Anemia in other chronic diseases classified elsewhere: Secondary | ICD-10-CM

## 2020-03-14 DIAGNOSIS — M5136 Other intervertebral disc degeneration, lumbar region: Secondary | ICD-10-CM | POA: Insufficient documentation

## 2020-03-14 DIAGNOSIS — Z95828 Presence of other vascular implants and grafts: Secondary | ICD-10-CM

## 2020-03-14 DIAGNOSIS — Z79899 Other long term (current) drug therapy: Secondary | ICD-10-CM | POA: Diagnosis not present

## 2020-03-14 DIAGNOSIS — R1084 Generalized abdominal pain: Secondary | ICD-10-CM | POA: Diagnosis not present

## 2020-03-14 DIAGNOSIS — C561 Malignant neoplasm of right ovary: Secondary | ICD-10-CM

## 2020-03-14 DIAGNOSIS — Z7189 Other specified counseling: Secondary | ICD-10-CM

## 2020-03-14 DIAGNOSIS — C800 Disseminated malignant neoplasm, unspecified: Secondary | ICD-10-CM

## 2020-03-14 DIAGNOSIS — Z5111 Encounter for antineoplastic chemotherapy: Secondary | ICD-10-CM | POA: Insufficient documentation

## 2020-03-14 DIAGNOSIS — R109 Unspecified abdominal pain: Secondary | ICD-10-CM | POA: Insufficient documentation

## 2020-03-14 DIAGNOSIS — R1319 Other dysphagia: Secondary | ICD-10-CM | POA: Diagnosis not present

## 2020-03-14 DIAGNOSIS — Z8673 Personal history of transient ischemic attack (TIA), and cerebral infarction without residual deficits: Secondary | ICD-10-CM | POA: Insufficient documentation

## 2020-03-14 DIAGNOSIS — R4702 Dysphasia: Secondary | ICD-10-CM

## 2020-03-14 DIAGNOSIS — I700 Atherosclerosis of aorta: Secondary | ICD-10-CM | POA: Insufficient documentation

## 2020-03-14 LAB — CMP (CANCER CENTER ONLY)
ALT: 24 U/L (ref 0–44)
AST: 17 U/L (ref 15–41)
Albumin: 3.4 g/dL — ABNORMAL LOW (ref 3.5–5.0)
Alkaline Phosphatase: 136 U/L — ABNORMAL HIGH (ref 38–126)
Anion gap: 6 (ref 5–15)
BUN: 9 mg/dL (ref 8–23)
CO2: 27 mmol/L (ref 22–32)
Calcium: 9.2 mg/dL (ref 8.9–10.3)
Chloride: 108 mmol/L (ref 98–111)
Creatinine: 0.72 mg/dL (ref 0.44–1.00)
GFR, Est AFR Am: >60 mL/min (ref >60)
GFR, Estimated: >60 mL/min (ref >60)
Glucose, Bld: 111 mg/dL — ABNORMAL HIGH (ref 70–99)
Potassium: 3.8 mmol/L (ref 3.5–5.1)
Sodium: 141 mmol/L (ref 135–145)
Total Bilirubin: 0.7 mg/dL (ref 0.3–1.2)
Total Protein: 7.3 g/dL (ref 6.5–8.1)

## 2020-03-14 LAB — CBC WITH DIFFERENTIAL (CANCER CENTER ONLY)
Abs Immature Granulocytes: 0.01 10*3/uL (ref 0.00–0.07)
Basophils Absolute: 0 10*3/uL (ref 0.0–0.1)
Basophils Relative: 1 %
Eosinophils Absolute: 0.1 10*3/uL (ref 0.0–0.5)
Eosinophils Relative: 1 %
HCT: 37 % (ref 36.0–46.0)
Hemoglobin: 12.2 g/dL (ref 12.0–15.0)
Immature Granulocytes: 0 %
Lymphocytes Relative: 22 %
Lymphs Abs: 1.3 10*3/uL (ref 0.7–4.0)
MCH: 36.4 pg — ABNORMAL HIGH (ref 26.0–34.0)
MCHC: 33 g/dL (ref 30.0–36.0)
MCV: 110.4 fL — ABNORMAL HIGH (ref 80.0–100.0)
Monocytes Absolute: 0.7 10*3/uL (ref 0.1–1.0)
Monocytes Relative: 12 %
Neutro Abs: 3.8 10*3/uL (ref 1.7–7.7)
Neutrophils Relative %: 64 %
Platelet Count: 154 10*3/uL (ref 150–400)
RBC: 3.35 MIL/uL — ABNORMAL LOW (ref 3.87–5.11)
RDW: 14.7 % (ref 11.5–15.5)
WBC Count: 5.9 10*3/uL (ref 4.0–10.5)
nRBC: 0 % (ref 0.0–0.2)

## 2020-03-14 MED ORDER — PROCHLORPERAZINE MALEATE 10 MG PO TABS
10.0000 mg | ORAL_TABLET | Freq: Once | ORAL | Status: AC
  Administered 2020-03-14: 10 mg via ORAL

## 2020-03-14 MED ORDER — SODIUM CHLORIDE 0.9% FLUSH
10.0000 mL | INTRAVENOUS | Status: DC | PRN
  Administered 2020-03-14: 10 mL
  Filled 2020-03-14: qty 10

## 2020-03-14 MED ORDER — ONDANSETRON 4 MG PO TBDP: 4.0000 mg | ORAL_TABLET | Freq: Three times a day (TID) | ORAL | 9 refills | Status: AC | PRN

## 2020-03-14 MED ORDER — SODIUM CHLORIDE 0.9 % IV SOLN
600.0000 mg/m2 | Freq: Once | INTRAVENOUS | Status: AC
  Administered 2020-03-14: 1292 mg via INTRAVENOUS
  Filled 2020-03-14: qty 33.98

## 2020-03-14 MED ORDER — PROCHLORPERAZINE MALEATE 10 MG PO TABS
ORAL_TABLET | ORAL | Status: AC
  Filled 2020-03-14: qty 1

## 2020-03-14 MED ORDER — SODIUM CHLORIDE 0.9 % IV SOLN
Freq: Once | INTRAVENOUS | Status: AC
  Filled 2020-03-14: qty 250

## 2020-03-14 MED ORDER — OXYCODONE HCL 5 MG PO TABS: 5.0000 mg | ORAL_TABLET | Freq: Four times a day (QID) | ORAL | 0 refills | Status: DC | PRN

## 2020-03-14 MED ORDER — SODIUM CHLORIDE 0.9% FLUSH
10.0000 mL | Freq: Once | INTRAVENOUS | Status: AC
  Administered 2020-03-14: 10 mL
  Filled 2020-03-14: qty 10

## 2020-03-14 MED ORDER — HEPARIN SOD (PORK) LOCK FLUSH 100 UNIT/ML IV SOLN
500.0000 [IU] | Freq: Once | INTRAVENOUS | Status: AC | PRN
  Administered 2020-03-14: 500 [IU]
  Filled 2020-03-14: qty 5

## 2020-03-14 NOTE — Assessment & Plan Note (Signed)
She has multifocal pain the current prescribed pain medicine is adequate to control her pain She will continue oxycodone as prescribed 

## 2020-03-14 NOTE — Assessment & Plan Note (Signed)
I have reviewed CT imaging with the patient and her daughter extensively She has overall stable disease Small lymphadenopathy seen on CT imaging that appears a bit larger is of unknown significance I plan to repeat another imaging study in a few months, probably around early July In the meantime, we will continue treatment every other week

## 2020-03-14 NOTE — Assessment & Plan Note (Signed)
She tolerated treatment every other week well She is mildly anemic but not symptomatic She have no signs of bleeding Observe for now 

## 2020-03-14 NOTE — Assessment & Plan Note (Signed)
She has expressive dysphagia from prior history of stroke I recommend her daughter to come with her for doctor's visit appointment in the future to facilitate accurate communication 

## 2020-03-14 NOTE — Progress Notes (Signed)
[place] OFFICE PROGRESS NOTE  Patient Care Team: Helane Rima, MD as PCP - General (Family Medicine) Encarnacion Slates, MD as Referring Physician (Neurology) Cameron Sprang, MD as Consulting Physician (Neurology)  ASSESSMENT & PLAN:  Ovarian cancer [facility]) I have reviewed CT imaging with the patient and her daughter extensively She has overall stable disease Small lymphadenopathy seen on CT imaging that appears a bit larger is of unknown significance I plan to repeat another imaging study in a few months, probably around early July In the meantime, we will continue treatment every other week  Anemia, chronic disease She tolerated treatment every other week well She is mildly anemic but not symptomatic She have no signs of bleeding Observe for now  Abdominal pain She has multifocal pain the current prescribed pain medicine is adequate to control her pain She will continue oxycodone as prescribed  Expressive dysphasia She has expressive dysphagia from prior history of stroke I recommend her daughter to come with her for doctor's visit appointment in the future to facilitate accurate communication   No orders of the defined types were placed in this encounter.   All questions were answered. The patient knows to call the clinic with any problems, questions or concerns. The total time spent in the appointment was 30 minutes encounter with patients including review of chart and various tests results, discussions about plan of care and coordination of care plan   Heath Lark, MD 03/14/2020 11:59 AM  INTERVAL HISTORY: Please see below for problem oriented charting. She returns with her daughter, Kenney Houseman for further follow-up She is doing well She has occasional nausea Her pain is well controlled No new neurological deficits No issues with recent side effects of chemotherapy  SUMMARY OF ONCOLOGIC HISTORY: Oncology History Overview Note  Negative genetic  testing Progressed on Niraparib   Carcinomatosis ([place])  12/06/2015 Initial Diagnosis   Carcinomatosis ([place])   Ovarian cancer, bilateral ([place])  12/24/2015 Initial Diagnosis   Ovarian cancer, bilateral ([place])   Ovarian cancer ([place])  11/28/2015 Imaging   Ct scan of abdomen: Peritoneal carcinomatosis and pelvic/inguinal adenopathy. Gynecologic primary is favored.   12/17/2015 Pathology Results   Lymph node, needle/core biopsy, L inguinal LAN - METASTATIC ADENOCARCINOMA. Microscopic Comment Immunohistochemistry will be performed and reported as an addendum. (JDP:ecj 12/18/2015) ADDENDUM: Immunohistochemistry shows strong positivity with cytokeratin 7, estrogen receptor (ER), progesterone receptor (PR), p53 and WT1. Negative markers are cytokeratin 20 , CDX- 2 and gross cystic disease fluid protein. 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 (JDP:kh 12-19-15   12/17/2015 Procedure   Technically successful ultrasound guided core left inguinal adenopathy biopsy   12/24/2015 Tumor Marker   Patient's tumor was tested for the following markers: CA-125 Results of the tumor marker test revealed 608.2   12/27/2015 - 02/28/2016 Chemotherapy   She received neoadjuvant chemo x 3 cycles   01/01/2016 Tumor Marker   Patient's tumor was tested for the following markers: CA-125 Results of the tumor marker test revealed 741.6   01/29/2016 Procedure   Successful placement of a right internal jugular approach power injectable Port-A-Cath. The catheter is ready for immediate use   01/29/2016 Imaging   US abdomen 1. Hyperechoic 2.5 x 1.0 x 1.6 cm lesion in the region the caudate lobe of the liver. Similar finding noted on prior CT of 11/28/2015. This could represent hemangioma. This could represent a malignancy including metastatic disease. 2. Exam otherwise unremarkable. No  gallstones. No biliary distention.    02/03/2016 Tumor Marker   Patient's tumor was tested for the following markers: CA-125 Results of the tumor marker test revealed 137.1   02/20/2016 Imaging   MRI brain No acute infarct.  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  No intracranial mass.  Moderate small vessel disease changes.  Global atrophy without hydrocephalus.  Expanded partially empty sella without secondary findings of pseudotumor cerebri.  Minimal mucosal thickening ethmoid sinus air cells. Small air-fluid levels maxillary sinuses bilaterally may indicate changes of acute sinusitis.    02/28/2016 Tumor Marker   Patient's tumor was tested for the following markers: CA-125 Results of the tumor marker test revealed 33.6   03/02/2016 Imaging   CT chest, abdomen and pelvis 1. Today's study demonstrates a positive response to therapy with resolution of the previously noted malignant ascites, significant regression of previously noted peritoneal implants, and regression of previously noted lymphadenopathy in the abdomen and pelvis. 2. No definite evidence to suggest metastatic disease to the thorax. 3. Stable 1.0 x 1.7 cm intermediate attenuation lesion associated with the posterior aspect of segment 1 of the liver, favored to represent a mildly proteinaceous hepatic cyst. The possibility of a peritoneal implant in this region is not entirely excluded, but is not favored on today's examination. 4. Extensive post infectious scarring inguinal upper right lung, likely sequela of prior necrotizing pneumonia. 5. Multiple tiny pulmonary nodules scattered throughout the lungs bilaterally all measuring 4 mm or less. These are nonspecific, but favored to be benign. Given the patient's history of primary gynecologic malignancy, attention on followup studies is recommended to ensure the stability of these findings. 6. Additional incidental findings, as above   03/14/2016 Imaging   CT angiogram 1. No  pulmonary emboli. 2. Chronic changes to the right upper lobe. 3. Stable lesion in the caudate lobe of the liver. 4. Stable soft tissue prominence to the right of the trachea as described above. 5. Stable small nodule in the right lung.    03/24/2016 Pathology Results   1. Omentum, resection for tumor - FOCAL ADENOCARCINOMA ASSOCIATED WITH EXTENSIVE FIBROSIS, INFLAMMATION AND HEMOSIDERIN DEPOSITION. 2. Uterus +/- tubes/ovaries, neoplastic, cervix - CERVIX: SLIGHT CERVICITIS AND SQUAMOUS METAPLASIA. - ENDOMETRIUM: ATROPHIC, NO HYPERPLASIA OR MALIGNANCY. - MYOMETRIUM: LEIOMYOMATA WITH DEGENERATIVE CHANGES. NO MALIGNANCY. - UTERINE SEROSA: FOCAL ADENOCARCINOMA ASSOCIATED WITH ADHESIONS. - RIGHT OVARY: BENIGN CALCIFIED NODULE AND BENIGN SEROUS CYST. NO MALIGNANCY IDENTIFIED. - RIGHT FALLOPIAN TUBE: UNREMARKABLE. NO MALIGNANCY IDENTIFIED. - LEFT OVARY: FOCAL ADENOCARCINOMA ASSOCIATED WITH EXTENSIVE FIBROSIS. - LEFT FALLOPIAN TUBE: HYDROSALPINX. NO MALIGNANCY IDENTIFIED. 3. Lymph node, biopsy, right external iliac - METASTATIC ADENOCARCINOMA, 4. Soft tissue, biopsy, left para colic gutter - FOCAL ADENOCARCINOMA ASSOCIATED WITH FIBROSIS. Microscopic Comment 2. OVARY Specimen(s): Uterus with bilateral ovaries, omentum, right external iliac lymph node and left paracolic gutter. Procedure: (including lymph node sampling) Hysterectomy with bilateral salpingo-oophorectomy, omentectomy, lymph node biopsy and paracolic gutter biopsy. Primary tumor site (including laterality): Left ovary. Ovarian surface involvement: Yes Ovarian capsule intact without fragmentation: N/A Maximum tumor size (cm): 2 cm, 2 cm Histologic type: Serous carcinoma. Grade: 3 Peritoneal implants: (specify invasive or non-invasive): Left paracolic gutter positive for carcinoma Pelvic extension (list additional structures on separate lines and if involved): Omentum, right paracolic gutter and uterine serosa. Lymph nodes:  number examined 1 ; number positive 1 TNM code: ypT3a, ypN1b, ypMX FIGO Stage (based on pathologic findings, needs clinical correlation): IIIA2 Comments: The left ovary has a 2 cm nodule, which is composed  primarily of fibrous tissue with small foci of residual adenocarcinoma. There is also microscopic involvement of the uterine serosa, the left paracolic gutter biopsy and the omentum. The left paracolic gutter and omental involvement is microscopic and both are associated with extensive fibrosis with focal hemosiderin deposition. The right external iliac node is completely replaced by metastatic adenocarcinoma with serous features. (JDP:ecj 03/30/2016)   03/24/2016 Surgery   Operation: Robotic-assisted laparoscopic total hysterectomy with bilateral salpingoophorectomy, omentectomy, radical tumor debulking  Surgeon: Donaciano Eva  Operative Findings:  : small nodules in omentum, no ascites. Omental nodule (2cm) adherent to lower anterior abdominal wall. 2cm left colonic gutter cystic nodule. 6cm right external iliac lymph node. Grossly normal ovaries and tubes. Fibroid uterus. No gross residual disease at completion of surgery representing R0 optimal/complete resection.    04/17/2016 Imaging   CT abdomen and pelvis 1. There is no evidence of acute inflammatory process within abdomen or pelvis. 2. Stable low-density lesion within caudate lobe of the liver. No new hepatic lesions are noted. 3. Again noted lobulated renal contour and multifocal renal cortical scarring. No hydronephrosis or hydroureter. 4. No significant mesenteric adenopathy. No retroperitoneal adenopathy. Stable minimal residual peritoneal thickening within pelvis. No new pelvic implants or pelvic ascites. 5. Status post hysterectomy.  Unremarkable urinary bladder. 6. Moderate stool within colon as described above. No evidence of colitis.    04/24/2016 - 06/19/2016 Chemotherapy   She received 3 more cycles of chemo  after surgery   05/14/2016 Tumor Marker   Patient's tumor was tested for the following markers: CA-125 Results of the tumor marker test revealed 22.5   06/04/2016 Tumor Marker   Patient's tumor was tested for the following markers: CA-125 Results of the tumor marker test revealed 13.7   07/07/2016 Genetic Testing   Patient has genetic testing done for breast/ovasrian cancer panel Results revealed patient has no actionable mutation   07/23/2016 Imaging   Ct abdomen and pelvis 1. Heterogeneously enhancing 4.9 x 3.2 x 4.4 cm mass along the right pelvic sidewall may represent locally recurrent disease or malignant lymphadenopathy. 2. No other signs of definite metastatic disease noted elsewhere in the abdomen or pelvis. 3. Stable low-attenuation hepatic lesion in the caudate lobe of the liver, which appears to demonstrates some progressive centripetal filling on delayed images. This lesion is presumably benign given its stability compared to prior studies, and is favored to represent a small cavernous hemangioma. 4. Cardiomegaly with biatrial dilatation, which is very severe on the right side. 5. Aortic atherosclerosis. 6. Additional incidental findings, as above.   07/23/2016 Tumor Marker   Patient's tumor was tested for the following markers: CA-125 Results of the tumor marker test revealed 12.3   09/30/2016 Imaging   Ct abdomen and pelvis: 1. 4.9 cm heterogeneously enhancing mass seen along the right pelvic sidewall previously has almost completely resolved. There is some ill-defined soft tissue attenuation/peritoneal thickening in this region today but no discrete measurable lesion is evident. 2. No new or progressive findings on today's exam. 3. Stable hypo attenuating lesion in the caudate lobe of the liver. 4. Subtle aortic atherosclerosis   09/30/2016 Tumor Marker   Patient's tumor was tested for the following markers: CA-125 Results of the tumor marker test revealed 9.6    10/05/2016 Pathology Results   Vagina, biopsy, left cuff - BENIGN FIBROEPITHELIAL (STROMAL) POLYP. - NO DYSPLASIA, ATYPIA OR MALIGNANCY IDENTIFIED.   01/14/2017 Tumor Marker   Patient's tumor was tested for the following markers: CA-125 Results of the  tumor marker test revealed 11.9   05/05/2017 Tumor Marker   Patient's tumor was tested for the following markers: CA-125 Results of the tumor marker test revealed 28   06/14/2017 Tumor Marker   Patient's tumor was tested for the following markers: CA-125 Results of the tumor marker test revealed 45.7   06/24/2017 Imaging   Ct abdomen and pelvis: Increased peritoneal metastatic disease in abdomen and pelvis since prior exam. No evidence of ascites. Stable small benign hepatic hemangioma.   07/21/2017 Tumor Marker   Patient's tumor was tested for the following markers: CA-125 Results of the tumor marker test revealed 84.1   07/21/2017 - 11/03/2017 Chemotherapy   She received carboplatin and taxol   08/11/2017 Adverse Reaction   She had severe neuropathy due to treatment. Does of chemo is reduced starting cycle 2 onwards   09/02/2017 Tumor Marker   Patient's tumor was tested for the following markers: CA-125 Results of the tumor marker test revealed 27.7   09/16/2017 Imaging   CT CHEST IMPRESSION  1. No acute process or evidence of metastatic disease in the chest. 2. Right upper lung bronchiectasis, consolidation, and architectural distortion are most likely post infectious or inflammatory and not significantly changed. 3. Aortic Atherosclerosis (ICD10-I70.0).  CT ABDOMEN AND PELVIS IMPRESSION  1. Response to therapy of nodal and peritoneal metastasis. 2. No new or progressive disease. 3. Caudate lobe hemangioma, as before. 4. Bilateral renal cortical thinning. Similar minimal right-sided caliectasis and hydroureter. Likely secondary to low-grade obstruction by the dominant right pelvic implant.   09/22/2017 Tumor Marker    Patient's tumor was tested for the following markers: CA-125 Results of the tumor marker test revealed 18   11/03/2017 Tumor Marker   Patient's tumor was tested for the following markers: CA-125 Results of the tumor marker test revealed 13.4   11/25/2017 Imaging   1. Stable to slight interval decrease in size of nodal and peritoneal metastatic disease within the abdomen. 2. No new or progressive disease.   12/03/2017 - 09/11/2019 Chemotherapy   The patient is prescribed [place]   01/17/2018 Tumor Marker   Patient's tumor was tested for the following markers: CA-125 Results of the tumor marker test revealed 11.9   02/22/2018 Tumor Marker   Patient's tumor was tested for the following markers: CA-125 Results of the tumor marker test revealed 11.7   02/22/2018 Imaging   1. Improved nodular thickening along the vaginal cuff on the left and medial to the right iliac vessels, likely treated implants. No evidence of progressive peritoneal disease. 2. Stable hepatic hemangioma. No evidence of solid visceral organ metastases. 3. Bilateral renal cortical scarring.   05/19/2018 Tumor Marker   Patient's tumor was tested for the following markers: CA-125 Results of the tumor marker test revealed 9.9   08/10/2018 Imaging   Stable mild nodular thickening of the left vaginal cuff. No new or progressive disease within the abdomen or pelvis.  Stable small hepatic hemangioma.   09/21/2018 Tumor Marker   Patient's tumor was tested for the following markers: CA-125 Results of the tumor marker test revealed 10.3   11/11/2018 Tumor Marker   Patient's tumor was tested for the following markers: CA-125 Results of the tumor marker test revealed 10.5   12/15/2018 Tumor Marker   Patient's tumor was tested for the following markers: CA-125 Results of the tumor marker test revealed 12.6   01/06/2019 Tumor Marker   Patient's tumor was tested for the following markers: CA-125 Results of the tumor marker test  revealed 14.6   01/12/2019 Imaging   1. Chronic extensive right upper lobe scarring changes and bronchiectasis. 2. No mediastinal or hilar mass or adenopathy and no findings for pulmonary metastatic disease. 3. Stable caudate lobe hemangioma. 4. No worrisome omental or peritoneal surface lesions. 5. Stable pericecal and small pelvic lymph nodes. No new or progressive findings.    09/11/2019 Imaging   Ct abdomen and pelvis Pericecal peritoneal metastatic implants   09/19/2019 -  Chemotherapy   The patient had carboplatin and gemxar for chemotherapy treatment.     11/20/2019 Imaging   1. Mild decrease in size of peritoneal tumor implant along the lateral aspect of the cecum. 2. Stable tiny sub-cm mesenteric soft tissue nodules or lymph nodes in right lower quadrant and anterior pelvic mesentery. 3. No new or progressive disease identified within the abdomen or pelvis.   03/12/2020 Imaging   1. No acute findings within the abdomen or pelvis. 2. Stable appearance of right lower quadrant peritoneal implant adjacent to cecum. 3. Increase in size of right lower quadrant ileocolic lymph node. 1.2 cm on today's study versus 0.8 previously. 4. Possible early avascular necrosis involving the left femoral head.   Aortic Atherosclerosis (ICD10-I70.0).     REVIEW OF SYSTEMS:   Constitutional: Denies fevers, chills or abnormal weight loss Eyes: Denies blurriness of vision Ears, nose, mouth, throat, and face: Denies mucositis or sore throat Respiratory: Denies cough, dyspnea or wheezes Cardiovascular: Denies palpitation, chest discomfort or lower extremity swelling Skin: Denies abnormal skin rashes Lymphatics: Denies new lymphadenopathy or easy bruising Neurological:Denies numbness, tingling or new weaknesses Behavioral/Psych: Mood is stable, no new changes  All other systems were reviewed with the patient and are negative.  I have reviewed the past medical history, past surgical history,  social history and family history with the patient and they are unchanged from previous note.  ALLERGIES:  is allergic to codeine; lactulose; and latex.  MEDICATIONS:  Current Outpatient Medications  Medication Sig Dispense Refill  . apixaban (ELIQUIS) 5 MG TABS tablet Take 1 tablet (5 mg total) by mouth 2 (two) times daily. 60 tablet 5  . atorvastatin (LIPITOR) 40 MG tablet Take 40 mg by mouth at bedtime.    . gabapentin (NEURONTIN) 600 MG tablet Take 1 tablet (600 mg total) by mouth 2 (two) times daily. 60 tablet 11  . lidocaine-prilocaine (EMLA) cream Apply to Porta-Cath 1-2 hours prior to access as directed. 30 g 1  . metoprolol succinate (TOPROL-XL) 25 MG 24 hr tablet Take 25 mg by mouth daily.   5  . Multiple Vitamin (MULTIVITAMIN WITH MINERALS) TABS tablet Take 1 tablet by mouth daily.    Marland Kitchen omega-3 acid ethyl esters (LOVAZA) 1 g capsule Take 1 g by mouth daily.    . ondansetron (ZOFRAN ODT) 4 MG disintegrating tablet Take 1 tablet (4 mg total) by mouth every 8 (eight) hours as needed for nausea or vomiting. 60 tablet 9  . oxyCODONE (OXY IR/ROXICODONE) 5 MG immediate release tablet Take 1 tablet (5 mg total) by mouth every 6 (six) hours as needed for severe pain. 60 tablet 0  . PARoxetine (PAXIL) 40 MG tablet Take 40 mg by mouth daily.  0  . polyethylene glycol (MIRALAX / GLYCOLAX) packet Take 17 g by mouth daily.    . verapamil (CALAN-SR) 240 MG CR tablet Take 1 tablet (240 mg total) by mouth daily. 30 tablet 0   No current facility-administered medications for this visit.   Facility-Administered Medications Ordered in Other  Visits  Medication Dose Route Frequency Provider Last Rate Last Admin  . gemcitabine (GEMZAR) 1,292 mg in sodium chloride 0.9 % 250 mL chemo infusion  600 mg/m2 (Treatment Plan Recorded) Intravenous Once Alvy Bimler, Shealeigh Dunstan, MD      . heparin lock flush 100 unit/mL  500 Units Intracatheter Once PRN Alvy Bimler, Trenisha Lafavor, MD      . sodium chloride flush (NS) 0.9 % injection 10 mL   10 mL Intracatheter PRN Alvy Bimler, Tegan Britain, MD        PHYSICAL EXAMINATION: ECOG PERFORMANCE STATUS: 2 - Symptomatic, <50% confined to bed  Vitals:   03/14/20 1100  BP: 133/82  Pulse: (!) 59  Resp: 18  Temp: 98.2 F (36.8 C)  SpO2: 100%   Filed Weights   03/14/20 1100  Weight: 203 lb 12.8 oz (92.4 kg)    GENERAL:alert, no distress and comfortable  NEURO: alert & oriented x 3 with expressive dysphasia and chronic hemiparesis from stroke  LABORATORY DATA:  I have reviewed the data as listed    Component Value Date/Time   NA 141 03/14/2020 1048   NA 142 12/08/2017 1051   K 3.8 03/14/2020 1048   K 4.4 12/08/2017 1051   CL 108 03/14/2020 1048   CL 105 06/14/2017 1430   CO2 27 03/14/2020 1048   CO2 28 12/08/2017 1051   GLUCOSE 111 (H) 03/14/2020 1048   GLUCOSE 97 12/08/2017 1051   BUN 9 03/14/2020 1048   BUN 16.2 12/08/2017 1051   CREATININE 0.72 03/14/2020 1048   CREATININE 0.9 12/08/2017 1051   CALCIUM 9.2 03/14/2020 1048   CALCIUM 9.5 12/08/2017 1051   PROT 7.3 03/14/2020 1048   PROT 6.9 12/08/2017 1051   ALBUMIN 3.4 (L) 03/14/2020 1048   ALBUMIN 3.6 12/08/2017 1051   AST 17 03/14/2020 1048   AST 18 12/08/2017 1051   ALT 24 03/14/2020 1048   ALT 20 12/08/2017 1051   ALKPHOS 136 (H) 03/14/2020 1048   ALKPHOS 131 12/08/2017 1051   BILITOT 0.7 03/14/2020 1048   BILITOT 0.70 12/08/2017 1051   GFRNONAA >60 03/14/2020 1048   GFRAA >60 03/14/2020 1048    No results found for: SPEP, UPEP  Lab Results  Component Value Date   WBC 5.9 03/14/2020   NEUTROABS 3.8 03/14/2020   HGB 12.2 03/14/2020   HCT 37.0 03/14/2020   MCV 110.4 (H) 03/14/2020   PLT 154 03/14/2020      Chemistry      Component Value Date/Time   NA 141 03/14/2020 1048   NA 142 12/08/2017 1051   K 3.8 03/14/2020 1048   K 4.4 12/08/2017 1051   CL 108 03/14/2020 1048   CL 105 06/14/2017 1430   CO2 27 03/14/2020 1048   CO2 28 12/08/2017 1051   BUN 9 03/14/2020 1048   BUN 16.2 12/08/2017 1051    CREATININE 0.72 03/14/2020 1048   CREATININE 0.9 12/08/2017 1051      Component Value Date/Time   CALCIUM 9.2 03/14/2020 1048   CALCIUM 9.5 12/08/2017 1051   ALKPHOS 136 (H) 03/14/2020 1048   ALKPHOS 131 12/08/2017 1051   AST 17 03/14/2020 1048   AST 18 12/08/2017 1051   ALT 24 03/14/2020 1048   ALT 20 12/08/2017 1051   BILITOT 0.7 03/14/2020 1048   BILITOT 0.70 12/08/2017 1051       RADIOGRAPHIC STUDIES: I have reviewed multiple imaging studies with the patient and her daughter I have personally reviewed the radiological images as listed and agreed with the  findings in the report. CT ABDOMEN PELVIS W CONTRAST  Result Date: 03/12/2020 CLINICAL DATA:  Restaging ovarian cancer. EXAM: CT ABDOMEN AND PELVIS WITH CONTRAST TECHNIQUE: Multidetector CT imaging of the abdomen and pelvis was performed using the standard protocol following bolus administration of intravenous contrast. CONTRAST:  126m OMNIPAQUE IOHEXOL 300 MG/ML  SOLN COMPARISON:  11/20/2019 FINDINGS: Lower chest: No acute abnormality. Hepatobiliary: No focal liver abnormality is seen. No gallstones, gallbladder wall thickening, or biliary dilatation. Pancreas: Unremarkable. No pancreatic ductal dilatation or surrounding inflammatory changes. Spleen: Normal in size without focal abnormality. Adrenals/Urinary Tract: Normal appearance of the adrenal glands. Bilateral renal cortical scarring. No mass or hydronephrosis identified. Small hypodensity within the inferior pole of the right kidney measures 7 mm, image 21/7. Too small to characterize. The urinary bladder is unremarkable. Stomach/Bowel: Stomach is nondistended. The small bowel loops are nondilated. No small bowel wall thickening, inflammation or distension. No pathologic dilatation of the colon. Vascular/Lymphatic: Aortic atherosclerosis. No retroperitoneal adenopathy. No mesenteric adenopathy. No enlarged pelvic or inguinal lymph nodes. Reproductive: Prior hysterectomy. No adnexal  mass. Other: No free fluid or fluid collections. Right lower quadrant peritoneal implant adjacent to the cecum is again noted in appears partially calcified as previously noted. On today's exam this measures 2.0 x 1.7 cm, image 66/2. Previously 2.0 x 1.7 cm. Soft tissue nodule versus ileocolic lymph node is identified within the right lower quadrant measuring 1.2 cm, image 54/2. Previously this measured 0.8 cm. No new sites of disease. Musculoskeletal: No acute or significant osseous findings. Degenerative changes are noted involving bilateral hip joints. Possible early avascular necrosis is noted involving the left femoral head, image 49/4. Anterolisthesis of L4 on L5 noted. L4-5 degenerative disc disease and bilateral facet degenerative change noted. IMPRESSION: 1. No acute findings within the abdomen or pelvis. 2. Stable appearance of right lower quadrant peritoneal implant adjacent to cecum. 3. Increase in size of right lower quadrant ileocolic lymph node. 1.2 cm on today's study versus 0.8 previously. 4. Possible early avascular necrosis involving the left femoral head. Aortic Atherosclerosis (ICD10-I70.0). Electronically Signed   By: TKerby MoorsM.D.   On: 03/12/2020 10:16

## 2020-03-14 NOTE — Patient Instructions (Signed)
[facility] °Discharge Instructions for Patients Receiving Chemotherapy ° °Today you received the following chemotherapy agents Gemzar ° °To help prevent nausea and vomiting after your treatment, we encourage you to take your nausea medication as directed. °  °If you develop nausea and vomiting that is not controlled by your nausea medication, call the clinic.  ° °BELOW ARE SYMPTOMS THAT SHOULD BE REPORTED IMMEDIATELY: °· *FEVER GREATER THAN 100.5 F °· *CHILLS WITH OR WITHOUT FEVER °· NAUSEA AND VOMITING THAT IS NOT CONTROLLED WITH YOUR NAUSEA MEDICATION °· *UNUSUAL SHORTNESS OF BREATH °· *UNUSUAL BRUISING OR BLEEDING °· TENDERNESS IN MOUTH AND THROAT WITH OR WITHOUT PRESENCE OF ULCERS °· *URINARY PROBLEMS °· *BOWEL PROBLEMS °· UNUSUAL RASH °Items with * indicate a potential emergency and should be followed up as soon as possible. ° °Feel free to call the clinic should you have any questions or concerns. The clinic phone number is [phone]. ° °Please show the CHEMO ALERT CARD at check-in to the Emergency Department and triage nurse. ° ° °

## 2020-03-15 ENCOUNTER — Telehealth: Payer: Self-pay | Admitting: Hematology and Oncology

## 2020-03-15 NOTE — Telephone Encounter (Signed)
Scheduled per 4/8 sch msg. Called Kenney Houseman (daughter) left a msg. Mailing printout

## 2020-03-20 ENCOUNTER — Ambulatory Visit: Payer: Self-pay | Admitting: Neurology

## 2020-03-22 NOTE — Progress Notes (Signed)
Pharmacist Chemotherapy Monitoring - Follow Up Assessment    I verify that I have reviewed each item in the below checklist:  . Regimen for the patient is scheduled for the appropriate day and plan matches scheduled date. Marland Kitchen Appropriate non-routine labs are ordered dependent on drug ordered. . If applicable, additional medications reviewed and ordered per protocol based on lifetime cumulative doses and/or treatment regimen.   Plan for follow-up and/or issues identified: No . I-vent associated with next due treatment: No . MD and/or nursing notified: No  Lurene Robley K 03/22/2020 8:38 AM

## 2020-03-27 ENCOUNTER — Ambulatory Visit: Payer: Self-pay | Admitting: Neurology

## 2020-03-28 ENCOUNTER — Inpatient Hospital Stay: Payer: Medicare (Managed Care)

## 2020-03-28 ENCOUNTER — Other Ambulatory Visit: Payer: Self-pay

## 2020-03-28 VITALS — BP 124/81 | HR 63 | Temp 97.8°F | Resp 17

## 2020-03-28 DIAGNOSIS — C561 Malignant neoplasm of right ovary: Secondary | ICD-10-CM

## 2020-03-28 DIAGNOSIS — C800 Disseminated malignant neoplasm, unspecified: Secondary | ICD-10-CM

## 2020-03-28 DIAGNOSIS — C563 Malignant neoplasm of bilateral ovaries: Secondary | ICD-10-CM

## 2020-03-28 DIAGNOSIS — Z95828 Presence of other vascular implants and grafts: Secondary | ICD-10-CM

## 2020-03-28 DIAGNOSIS — C562 Malignant neoplasm of left ovary: Secondary | ICD-10-CM

## 2020-03-28 DIAGNOSIS — Z7189 Other specified counseling: Secondary | ICD-10-CM

## 2020-03-28 DIAGNOSIS — Z5111 Encounter for antineoplastic chemotherapy: Secondary | ICD-10-CM | POA: Diagnosis not present

## 2020-03-28 LAB — CMP (CANCER CENTER ONLY)
ALT: 19 U/L (ref 0–44)
AST: 16 U/L (ref 15–41)
Albumin: 3.4 g/dL — ABNORMAL LOW (ref 3.5–5.0)
Alkaline Phosphatase: 118 U/L (ref 38–126)
Anion gap: 6 (ref 5–15)
BUN: 14 mg/dL (ref 8–23)
CO2: 26 mmol/L (ref 22–32)
Calcium: 9.4 mg/dL (ref 8.9–10.3)
Chloride: 105 mmol/L (ref 98–111)
Creatinine: 0.79 mg/dL (ref 0.44–1.00)
GFR, Est AFR Am: >60 mL/min (ref >60)
GFR, Estimated: >60 mL/min (ref >60)
Glucose, Bld: 102 mg/dL — ABNORMAL HIGH (ref 70–99)
Potassium: 4 mmol/L (ref 3.5–5.1)
Sodium: 137 mmol/L (ref 135–145)
Total Bilirubin: 0.8 mg/dL (ref 0.3–1.2)
Total Protein: 7.6 g/dL (ref 6.5–8.1)

## 2020-03-28 LAB — CBC WITH DIFFERENTIAL (CANCER CENTER ONLY)
Abs Immature Granulocytes: 0.01 10*3/uL (ref 0.00–0.07)
Basophils Absolute: 0 10*3/uL (ref 0.0–0.1)
Basophils Relative: 1 %
Eosinophils Absolute: 0.1 10*3/uL (ref 0.0–0.5)
Eosinophils Relative: 1 %
HCT: 37.3 % (ref 36.0–46.0)
Hemoglobin: 12.3 g/dL (ref 12.0–15.0)
Immature Granulocytes: 0 %
Lymphocytes Relative: 37 %
Lymphs Abs: 1.5 10*3/uL (ref 0.7–4.0)
MCH: 35.1 pg — ABNORMAL HIGH (ref 26.0–34.0)
MCHC: 33 g/dL (ref 30.0–36.0)
MCV: 106.6 fL — ABNORMAL HIGH (ref 80.0–100.0)
Monocytes Absolute: 0.7 10*3/uL (ref 0.1–1.0)
Monocytes Relative: 17 %
Neutro Abs: 1.7 10*3/uL (ref 1.7–7.7)
Neutrophils Relative %: 44 %
Platelet Count: 249 10*3/uL (ref 150–400)
RBC: 3.5 MIL/uL — ABNORMAL LOW (ref 3.87–5.11)
RDW: 15.3 % (ref 11.5–15.5)
WBC Count: 4 10*3/uL (ref 4.0–10.5)
nRBC: 0 % (ref 0.0–0.2)

## 2020-03-28 MED ORDER — PROCHLORPERAZINE MALEATE 10 MG PO TABS
10.0000 mg | ORAL_TABLET | Freq: Once | ORAL | Status: AC
  Administered 2020-03-28: 10 mg via ORAL

## 2020-03-28 MED ORDER — HEPARIN SOD (PORK) LOCK FLUSH 100 UNIT/ML IV SOLN
500.0000 [IU] | Freq: Once | INTRAVENOUS | Status: AC | PRN
  Administered 2020-03-28: 500 [IU]
  Filled 2020-03-28: qty 5

## 2020-03-28 MED ORDER — PROCHLORPERAZINE MALEATE 10 MG PO TABS
ORAL_TABLET | ORAL | Status: AC
  Filled 2020-03-28: qty 1

## 2020-03-28 MED ORDER — SODIUM CHLORIDE 0.9 % IV SOLN
Freq: Once | INTRAVENOUS | Status: AC
  Filled 2020-03-28: qty 250

## 2020-03-28 MED ORDER — SODIUM CHLORIDE 0.9% FLUSH
10.0000 mL | INTRAVENOUS | Status: DC | PRN
  Administered 2020-03-28: 10 mL
  Filled 2020-03-28: qty 10

## 2020-03-28 MED ORDER — SODIUM CHLORIDE 0.9 % IV SOLN
600.0000 mg/m2 | Freq: Once | INTRAVENOUS | Status: AC
  Administered 2020-03-28: 1292 mg via INTRAVENOUS
  Filled 2020-03-28: qty 33.98

## 2020-03-28 MED ORDER — SODIUM CHLORIDE 0.9% FLUSH
10.0000 mL | Freq: Once | INTRAVENOUS | Status: AC
  Administered 2020-03-28: 10 mL
  Filled 2020-03-28: qty 10

## 2020-03-28 NOTE — Patient Instructions (Signed)

## 2020-03-28 NOTE — Patient Instructions (Signed)
[facility] Discharge Instructions for Patients Receiving Chemotherapy  Today you received the following chemotherapy agents gemzar.  To help prevent nausea and vomiting after your treatment, we encourage you to take your nausea medication as directed.   If you develop nausea and vomiting that is not controlled by your nausea medication, call the clinic.   BELOW ARE SYMPTOMS THAT SHOULD BE REPORTED IMMEDIATELY:  *FEVER GREATER THAN 100.5 F  *CHILLS WITH OR WITHOUT FEVER  NAUSEA AND VOMITING THAT IS NOT CONTROLLED WITH YOUR NAUSEA MEDICATION  *UNUSUAL SHORTNESS OF BREATH  *UNUSUAL BRUISING OR BLEEDING  TENDERNESS IN MOUTH AND THROAT WITH OR WITHOUT PRESENCE OF ULCERS  *URINARY PROBLEMS  *BOWEL PROBLEMS  UNUSUAL RASH Items with * indicate a potential emergency and should be followed up as soon as possible.  Feel free to call the clinic should you have any questions or concerns. The clinic phone number is [phone].  Please show the CHEMO ALERT CARD at check-in to the Emergency Department and triage nurse.   

## 2020-04-05 NOTE — Progress Notes (Signed)
Pharmacist Chemotherapy Monitoring - Follow Up Assessment    I verify that I have reviewed each item in the below checklist:  . Regimen for the patient is scheduled for the appropriate day and plan matches scheduled date. Marland Kitchen Appropriate non-routine labs are ordered dependent on drug ordered. . If applicable, additional medications reviewed and ordered per protocol based on lifetime cumulative doses and/or treatment regimen.   Plan for follow-up and/or issues identified: No . I-vent associated with next due treatment: No . MD and/or nursing notified: No    Kennith Center, Pharm.D., CPP 04/05/2020@10 :45 AM

## 2020-04-11 ENCOUNTER — Inpatient Hospital Stay: Payer: Medicare (Managed Care)

## 2020-04-11 ENCOUNTER — Inpatient Hospital Stay: Payer: Medicare (Managed Care) | Attending: Hematology and Oncology

## 2020-04-11 ENCOUNTER — Inpatient Hospital Stay (HOSPITAL_BASED_OUTPATIENT_CLINIC_OR_DEPARTMENT_OTHER): Payer: Medicare (Managed Care) | Admitting: Hematology and Oncology

## 2020-04-11 ENCOUNTER — Encounter: Payer: Self-pay | Admitting: Hematology and Oncology

## 2020-04-11 ENCOUNTER — Telehealth: Payer: Self-pay | Admitting: Hematology and Oncology

## 2020-04-11 ENCOUNTER — Other Ambulatory Visit: Payer: Self-pay

## 2020-04-11 DIAGNOSIS — Z7901 Long term (current) use of anticoagulants: Secondary | ICD-10-CM | POA: Diagnosis not present

## 2020-04-11 DIAGNOSIS — C562 Malignant neoplasm of left ovary: Secondary | ICD-10-CM

## 2020-04-11 DIAGNOSIS — Z79899 Other long term (current) drug therapy: Secondary | ICD-10-CM | POA: Insufficient documentation

## 2020-04-11 DIAGNOSIS — R1310 Dysphagia, unspecified: Secondary | ICD-10-CM | POA: Insufficient documentation

## 2020-04-11 DIAGNOSIS — D61818 Other pancytopenia: Secondary | ICD-10-CM

## 2020-04-11 DIAGNOSIS — C786 Secondary malignant neoplasm of retroperitoneum and peritoneum: Secondary | ICD-10-CM | POA: Diagnosis not present

## 2020-04-11 DIAGNOSIS — Z8673 Personal history of transient ischemic attack (TIA), and cerebral infarction without residual deficits: Secondary | ICD-10-CM | POA: Insufficient documentation

## 2020-04-11 DIAGNOSIS — R4702 Dysphasia: Secondary | ICD-10-CM

## 2020-04-11 DIAGNOSIS — C561 Malignant neoplasm of right ovary: Secondary | ICD-10-CM | POA: Insufficient documentation

## 2020-04-11 DIAGNOSIS — Z5111 Encounter for antineoplastic chemotherapy: Secondary | ICD-10-CM | POA: Diagnosis not present

## 2020-04-11 DIAGNOSIS — C563 Malignant neoplasm of bilateral ovaries: Secondary | ICD-10-CM

## 2020-04-11 DIAGNOSIS — C800 Disseminated malignant neoplasm, unspecified: Secondary | ICD-10-CM

## 2020-04-11 DIAGNOSIS — R590 Localized enlarged lymph nodes: Secondary | ICD-10-CM | POA: Insufficient documentation

## 2020-04-11 DIAGNOSIS — I700 Atherosclerosis of aorta: Secondary | ICD-10-CM | POA: Diagnosis not present

## 2020-04-11 DIAGNOSIS — Z7189 Other specified counseling: Secondary | ICD-10-CM

## 2020-04-11 LAB — CMP (CANCER CENTER ONLY)
ALT: 15 U/L (ref 0–44)
AST: 15 U/L (ref 15–41)
Albumin: 3.2 g/dL — ABNORMAL LOW (ref 3.5–5.0)
Alkaline Phosphatase: 122 U/L (ref 38–126)
Anion gap: 11 (ref 5–15)
BUN: 12 mg/dL (ref 8–23)
CO2: 26 mmol/L (ref 22–32)
Calcium: 9.4 mg/dL (ref 8.9–10.3)
Chloride: 107 mmol/L (ref 98–111)
Creatinine: 0.84 mg/dL (ref 0.44–1.00)
GFR, Est AFR Am: >60 mL/min (ref >60)
GFR, Estimated: >60 mL/min (ref >60)
Glucose, Bld: 102 mg/dL — ABNORMAL HIGH (ref 70–99)
Potassium: 4.1 mmol/L (ref 3.5–5.1)
Sodium: 144 mmol/L (ref 135–145)
Total Bilirubin: 0.6 mg/dL (ref 0.3–1.2)
Total Protein: 7.3 g/dL (ref 6.5–8.1)

## 2020-04-11 LAB — CBC WITH DIFFERENTIAL (CANCER CENTER ONLY)
Abs Immature Granulocytes: 0 10*3/uL (ref 0.00–0.07)
Basophils Absolute: 0 10*3/uL (ref 0.0–0.1)
Basophils Relative: 1 %
Eosinophils Absolute: 0.1 10*3/uL (ref 0.0–0.5)
Eosinophils Relative: 1 %
HCT: 35.6 % — ABNORMAL LOW (ref 36.0–46.0)
Hemoglobin: 11.7 g/dL — ABNORMAL LOW (ref 12.0–15.0)
Immature Granulocytes: 0 %
Lymphocytes Relative: 31 %
Lymphs Abs: 1.4 10*3/uL (ref 0.7–4.0)
MCH: 35.5 pg — ABNORMAL HIGH (ref 26.0–34.0)
MCHC: 32.9 g/dL (ref 30.0–36.0)
MCV: 107.9 fL — ABNORMAL HIGH (ref 80.0–100.0)
Monocytes Absolute: 0.5 10*3/uL (ref 0.1–1.0)
Monocytes Relative: 11 %
Neutro Abs: 2.5 10*3/uL (ref 1.7–7.7)
Neutrophils Relative %: 56 %
Platelet Count: 145 10*3/uL — ABNORMAL LOW (ref 150–400)
RBC: 3.3 MIL/uL — ABNORMAL LOW (ref 3.87–5.11)
RDW: 15.9 % — ABNORMAL HIGH (ref 11.5–15.5)
WBC Count: 4.4 10*3/uL (ref 4.0–10.5)
nRBC: 0 % (ref 0.0–0.2)

## 2020-04-11 MED ORDER — PROCHLORPERAZINE MALEATE 10 MG PO TABS
10.0000 mg | ORAL_TABLET | Freq: Once | ORAL | Status: AC
  Administered 2020-04-11: 10 mg via ORAL

## 2020-04-11 MED ORDER — SODIUM CHLORIDE 0.9% FLUSH
10.0000 mL | INTRAVENOUS | Status: DC | PRN
  Administered 2020-04-11: 10 mL
  Filled 2020-04-11: qty 10

## 2020-04-11 MED ORDER — PROCHLORPERAZINE MALEATE 10 MG PO TABS
ORAL_TABLET | ORAL | Status: AC
  Filled 2020-04-11: qty 1

## 2020-04-11 MED ORDER — SODIUM CHLORIDE 0.9 % IV SOLN
600.0000 mg/m2 | Freq: Once | INTRAVENOUS | Status: AC
  Administered 2020-04-11: 1292 mg via INTRAVENOUS
  Filled 2020-04-11: qty 33.98

## 2020-04-11 MED ORDER — HEPARIN SOD (PORK) LOCK FLUSH 100 UNIT/ML IV SOLN
500.0000 [IU] | Freq: Once | INTRAVENOUS | Status: AC | PRN
  Administered 2020-04-11: 500 [IU]
  Filled 2020-04-11: qty 5

## 2020-04-11 MED ORDER — SODIUM CHLORIDE 0.9 % IV SOLN
Freq: Once | INTRAVENOUS | Status: AC
  Filled 2020-04-11: qty 250

## 2020-04-11 NOTE — Telephone Encounter (Signed)
Scheduled appts per 5/6 sch msg. Pt confirmed appt dates and times.

## 2020-04-11 NOTE — Progress Notes (Signed)
[facility] OFFICE PROGRESS NOTE  Patient Care Team: Helane Rima, MD as PCP - General (Family Medicine) Encarnacion Slates, MD as Referring Physician (Neurology) Cameron Sprang, MD as Consulting Physician (Neurology)  ASSESSMENT & PLAN:  Ovarian cancer [facility]) She has overall stable disease on last imaging Small lymphadenopathy seen on CT imaging that appears a bit larger is of unknown significance I plan to repeat another imaging study in a few months, probably around early July In the meantime, we will continue treatment every other week  Pancytopenia, acquired [facility]) She has consistent pancytopenia with each cycle of treatment We will proceed with treatment today She does not need transfusion support  Expressive dysphasia She has expressive dysphagia from prior history of stroke I recommend her daughter to come with her for doctor's visit appointment in the future to facilitate accurate communication   No orders of the defined types were placed in this encounter.   All questions were answered. The patient knows to call the clinic with any problems, questions or concerns. The total time spent in the appointment was 20 minutes encounter with patients including review of chart and various tests results, discussions about plan of care and coordination of care plan   Heath Lark, MD 04/11/2020 2:13 PM  INTERVAL HISTORY: Please see below for problem oriented charting. She returns with her daughter for further follow-up She is doing well from treatment No recent nausea or changes in bowel habits Denies pain today No recent infection, fever or chills  SUMMARY OF ONCOLOGIC HISTORY: Oncology History Overview Note  Negative genetic testing Progressed on Niraparib   Carcinomatosis ([place])  12/06/2015 Initial Diagnosis   Carcinomatosis ([place])   Ovarian cancer, bilateral ([place])  12/24/2015 Initial Diagnosis   Ovarian cancer, bilateral ([place])   Ovarian cancer ([place])   11/28/2015 Imaging   Ct scan of abdomen: Peritoneal carcinomatosis and pelvic/inguinal adenopathy. Gynecologic primary is favored.   12/17/2015 Pathology Results   Lymph node, needle/core biopsy, L inguinal LAN - METASTATIC ADENOCARCINOMA. Microscopic Comment Immunohistochemistry will be performed and reported as an addendum. (JDP:ecj 12/18/2015) ADDENDUM: Immunohistochemistry shows strong positivity with cytokeratin 7, estrogen receptor (ER), progesterone receptor (PR), p53 and WT1. Negative markers are cytokeratin 20 , CDX- 2 and gross cystic disease fluid protein. 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 (JDP:kh 12-19-15   12/17/2015 Procedure   Technically successful ultrasound guided core left inguinal adenopathy biopsy   12/24/2015 Tumor Marker   Patient's tumor was tested for the following markers: CA-125 Results of the tumor marker test revealed 608.2   12/27/2015 - 02/28/2016 Chemotherapy   She received neoadjuvant chemo x 3 cycles   01/01/2016 Tumor Marker   Patient's tumor was tested for the following markers: CA-125 Results of the tumor marker test revealed 741.6   01/29/2016 Procedure   Successful placement of a right internal jugular approach power injectable Port-A-Cath. The catheter is ready for immediate use   01/29/2016 Imaging   US abdomen 1. Hyperechoic 2.5 x 1.0 x 1.6 cm lesion in the region the caudate lobe of the liver. Similar finding noted on prior CT of 11/28/2015. This could represent hemangioma. This could represent a malignancy including metastatic disease. 2. Exam otherwise unremarkable. No gallstones. No biliary distention.   02/03/2016 Tumor Marker   Patient's tumor was tested for the following markers: CA-125 Results of the tumor marker test revealed 137.1   02/20/2016 Imaging   MRI brain No acute  infarct.  Remote large left middle cerebral artery  distribution infarct.  No intracranial mass.  Moderate small vessel disease changes.  Global atrophy without hydrocephalus.  Expanded partially empty sella without secondary findings of pseudotumor cerebri.  Minimal mucosal thickening ethmoid sinus air cells. Small air-fluid levels maxillary sinuses bilaterally may indicate changes of acute sinusitis.    02/28/2016 Tumor Marker   Patient's tumor was tested for the following markers: CA-125 Results of the tumor marker test revealed 33.6   03/02/2016 Imaging   CT chest, abdomen and pelvis 1. Today's study demonstrates a positive response to therapy with resolution of the previously noted malignant ascites, significant regression of previously noted peritoneal implants, and regression of previously noted lymphadenopathy in the abdomen and pelvis. 2. No definite evidence to suggest metastatic disease to the thorax. 3. Stable 1.0 x 1.7 cm intermediate attenuation lesion associated with the posterior aspect of segment 1 of the liver, favored to represent a mildly proteinaceous hepatic cyst. The possibility of a peritoneal implant in this region is not entirely excluded, but is not favored on today's examination. 4. Extensive post infectious scarring inguinal upper right lung, likely sequela of prior necrotizing pneumonia. 5. Multiple tiny pulmonary nodules scattered throughout the lungs bilaterally all measuring 4 mm or less. These are nonspecific, but favored to be benign. Given the patient's history of primary gynecologic malignancy, attention on followup studies is recommended to ensure the stability of these findings. 6. Additional incidental findings, as above   03/14/2016 Imaging   CT angiogram 1. No pulmonary emboli. 2. Chronic changes to the right upper lobe. 3. Stable lesion in the caudate lobe of the liver. 4. Stable soft tissue prominence to the right of the trachea as described above. 5. Stable small nodule in the right  lung.    03/24/2016 Pathology Results   1. Omentum, resection for tumor - FOCAL ADENOCARCINOMA ASSOCIATED WITH EXTENSIVE FIBROSIS, INFLAMMATION AND HEMOSIDERIN DEPOSITION. 2. Uterus +/- tubes/ovaries, neoplastic, cervix - CERVIX: SLIGHT CERVICITIS AND SQUAMOUS METAPLASIA. - ENDOMETRIUM: ATROPHIC, NO HYPERPLASIA OR MALIGNANCY. - MYOMETRIUM: LEIOMYOMATA WITH DEGENERATIVE CHANGES. NO MALIGNANCY. - UTERINE SEROSA: FOCAL ADENOCARCINOMA ASSOCIATED WITH ADHESIONS. - RIGHT OVARY: BENIGN CALCIFIED NODULE AND BENIGN SEROUS CYST. NO MALIGNANCY IDENTIFIED. - RIGHT FALLOPIAN TUBE: UNREMARKABLE. NO MALIGNANCY IDENTIFIED. - LEFT OVARY: FOCAL ADENOCARCINOMA ASSOCIATED WITH EXTENSIVE FIBROSIS. - LEFT FALLOPIAN TUBE: HYDROSALPINX. NO MALIGNANCY IDENTIFIED. 3. Lymph node, biopsy, right external iliac - METASTATIC ADENOCARCINOMA, 4. Soft tissue, biopsy, left para colic gutter - FOCAL ADENOCARCINOMA ASSOCIATED WITH FIBROSIS. Microscopic Comment 2. OVARY Specimen(s): Uterus with bilateral ovaries, omentum, right external iliac lymph node and left paracolic gutter. Procedure: (including lymph node sampling) Hysterectomy with bilateral salpingo-oophorectomy, omentectomy, lymph node biopsy and paracolic gutter biopsy. Primary tumor site (including laterality): Left ovary. Ovarian surface involvement: Yes Ovarian capsule intact without fragmentation: N/A Maximum tumor size (cm): 2 cm, 2 cm Histologic type: Serous carcinoma. Grade: 3 Peritoneal implants: (specify invasive or non-invasive): Left paracolic gutter positive for carcinoma Pelvic extension (list additional structures on separate lines and if involved): Omentum, right paracolic gutter and uterine serosa. Lymph nodes: number examined 1 ; number positive 1 TNM code: ypT3a, ypN1b, ypMX FIGO Stage (based on pathologic findings, needs clinical correlation): IIIA2 Comments: The left ovary has a 2 cm nodule, which is composed primarily of fibrous tissue  with small foci of residual adenocarcinoma. There is also microscopic involvement of the uterine serosa, the left paracolic gutter biopsy and the omentum. The left paracolic gutter and omental involvement is microscopic and both  are associated with extensive fibrosis with focal hemosiderin deposition. The right external iliac node is completely replaced by metastatic adenocarcinoma with serous features. (JDP:ecj 03/30/2016)   03/24/2016 Surgery   Operation: Robotic-assisted laparoscopic total hysterectomy with bilateral salpingoophorectomy, omentectomy, radical tumor debulking  Surgeon: Donaciano Eva  Operative Findings:  : small nodules in omentum, no ascites. Omental nodule (2cm) adherent to lower anterior abdominal wall. 2cm left colonic gutter cystic nodule. 6cm right external iliac lymph node. Grossly normal ovaries and tubes. Fibroid uterus. No gross residual disease at completion of surgery representing R0 optimal/complete resection.    04/17/2016 Imaging   CT abdomen and pelvis 1. There is no evidence of acute inflammatory process within abdomen or pelvis. 2. Stable low-density lesion within caudate lobe of the liver. No new hepatic lesions are noted. 3. Again noted lobulated renal contour and multifocal renal cortical scarring. No hydronephrosis or hydroureter. 4. No significant mesenteric adenopathy. No retroperitoneal adenopathy. Stable minimal residual peritoneal thickening within pelvis. No new pelvic implants or pelvic ascites. 5. Status post hysterectomy.  Unremarkable urinary bladder. 6. Moderate stool within colon as described above. No evidence of colitis.    04/24/2016 - 06/19/2016 Chemotherapy   She received 3 more cycles of chemo after surgery   05/14/2016 Tumor Marker   Patient's tumor was tested for the following markers: CA-125 Results of the tumor marker test revealed 22.5   06/04/2016 Tumor Marker   Patient's tumor was tested for the following markers:  CA-125 Results of the tumor marker test revealed 13.7   07/07/2016 Genetic Testing   Patient has genetic testing done for breast/ovasrian cancer panel Results revealed patient has no actionable mutation   07/23/2016 Imaging   Ct abdomen and pelvis 1. Heterogeneously enhancing 4.9 x 3.2 x 4.4 cm mass along the right pelvic sidewall may represent locally recurrent disease or malignant lymphadenopathy. 2. No other signs of definite metastatic disease noted elsewhere in the abdomen or pelvis. 3. Stable low-attenuation hepatic lesion in the caudate lobe of the liver, which appears to demonstrates some progressive centripetal filling on delayed images. This lesion is presumably benign given its stability compared to prior studies, and is favored to represent a small cavernous hemangioma. 4. Cardiomegaly with biatrial dilatation, which is very severe on the right side. 5. Aortic atherosclerosis. 6. Additional incidental findings, as above.   07/23/2016 Tumor Marker   Patient's tumor was tested for the following markers: CA-125 Results of the tumor marker test revealed 12.3   09/30/2016 Imaging   Ct abdomen and pelvis: 1. 4.9 cm heterogeneously enhancing mass seen along the right pelvic sidewall previously has almost completely resolved. There is some ill-defined soft tissue attenuation/peritoneal thickening in this region today but no discrete measurable lesion is evident. 2. No new or progressive findings on today's exam. 3. Stable hypo attenuating lesion in the caudate lobe of the liver. 4. Subtle aortic atherosclerosis   09/30/2016 Tumor Marker   Patient's tumor was tested for the following markers: CA-125 Results of the tumor marker test revealed 9.6   10/05/2016 Pathology Results   Vagina, biopsy, left cuff - BENIGN FIBROEPITHELIAL (STROMAL) POLYP. - NO DYSPLASIA, ATYPIA OR MALIGNANCY IDENTIFIED.   01/14/2017 Tumor Marker   Patient's tumor was tested for the following markers:  CA-125 Results of the tumor marker test revealed 11.9   05/05/2017 Tumor Marker   Patient's tumor was tested for the following markers: CA-125 Results of the tumor marker test revealed 28   06/14/2017 Tumor Marker   Patient's tumor  was tested for the following markers: CA-125 Results of the tumor marker test revealed 45.7   06/24/2017 Imaging   Ct abdomen and pelvis: Increased peritoneal metastatic disease in abdomen and pelvis since prior exam. No evidence of ascites. Stable small benign hepatic hemangioma.   07/21/2017 Tumor Marker   Patient's tumor was tested for the following markers: CA-125 Results of the tumor marker test revealed 84.1   07/21/2017 - 11/03/2017 Chemotherapy   She received carboplatin and taxol   08/11/2017 Adverse Reaction   She had severe neuropathy due to treatment. Does of chemo is reduced starting cycle 2 onwards   09/02/2017 Tumor Marker   Patient's tumor was tested for the following markers: CA-125 Results of the tumor marker test revealed 27.7   09/16/2017 Imaging   CT CHEST IMPRESSION  1. No acute process or evidence of metastatic disease in the chest. 2. Right upper lung bronchiectasis, consolidation, and architectural distortion are most likely post infectious or inflammatory and not significantly changed. 3. Aortic Atherosclerosis (ICD10-I70.0).  CT ABDOMEN AND PELVIS IMPRESSION  1. Response to therapy of nodal and peritoneal metastasis. 2. No new or progressive disease. 3. Caudate lobe hemangioma, as before. 4. Bilateral renal cortical thinning. Similar minimal right-sided caliectasis and hydroureter. Likely secondary to low-grade obstruction by the dominant right pelvic implant.   09/22/2017 Tumor Marker   Patient's tumor was tested for the following markers: CA-125 Results of the tumor marker test revealed 18   11/03/2017 Tumor Marker   Patient's tumor was tested for the following markers: CA-125 Results of the tumor marker test revealed  13.4   11/25/2017 Imaging   1. Stable to slight interval decrease in size of nodal and peritoneal metastatic disease within the abdomen. 2. No new or progressive disease.   12/03/2017 - 09/11/2019 Chemotherapy   The patient is prescribed Ze[place]ula   01/17/2018 Tumor Marker   Patient's tumor was tested for the following markers: CA-125 Results of the tumor marker test revealed 11.9   02/22/2018 Tumor Marker   Patient's tumor was tested for the following markers: CA-125 Results of the tumor marker test revealed 11.7   02/22/2018 Imaging   1. Improved nodular thickening along the vaginal cuff on the left and medial to the right iliac vessels, likely treated implants. No evidence of progressive peritoneal disease. 2. Stable hepatic hemangioma. No evidence of solid visceral organ metastases. 3. Bilateral renal cortical scarring.   05/19/2018 Tumor Marker   Patient's tumor was tested for the following markers: CA-125 Results of the tumor marker test revealed 9.9   08/10/2018 Imaging   Stable mild nodular thickening of the left vaginal cuff. No new or progressive disease within the abdomen or pelvis.  Stable small hepatic hemangioma.   09/21/2018 Tumor Marker   Patient's tumor was tested for the following markers: CA-125 Results of the tumor marker test revealed 10.3   11/11/2018 Tumor Marker   Patient's tumor was tested for the following markers: CA-125 Results of the tumor marker test revealed 10.5   12/15/2018 Tumor Marker   Patient's tumor was tested for the following markers: CA-125 Results of the tumor marker test revealed 12.6   01/06/2019 Tumor Marker   Patient's tumor was tested for the following markers: CA-125 Results of the tumor marker test revealed 14.6   01/12/2019 Imaging   1. Chronic extensive right upper lobe scarring changes and bronchiectasis. 2. No mediastinal or hilar mass or adenopathy and no findings for pulmonary metastatic disease. 3. Stable caudate lobe  hemangioma.  4. No worrisome omental or peritoneal surface lesions. 5. Stable pericecal and small pelvic lymph nodes. No new or progressive findings.    09/11/2019 Imaging   Ct abdomen and pelvis Pericecal peritoneal metastatic implants   09/19/2019 -  Chemotherapy   The patient had carboplatin and gemxar for chemotherapy treatment.     11/20/2019 Imaging   1. Mild decrease in size of peritoneal tumor implant along the lateral aspect of the cecum. 2. Stable tiny sub-cm mesenteric soft tissue nodules or lymph nodes in right lower quadrant and anterior pelvic mesentery. 3. No new or progressive disease identified within the abdomen or pelvis.   03/12/2020 Imaging   1. No acute findings within the abdomen or pelvis. 2. Stable appearance of right lower quadrant peritoneal implant adjacent to cecum. 3. Increase in size of right lower quadrant ileocolic lymph node. 1.2 cm on today's study versus 0.8 previously. 4. Possible early avascular necrosis involving the left femoral head.   Aortic Atherosclerosis (ICD10-I70.0).     REVIEW OF SYSTEMS:   Constitutional: Denies fevers, chills or abnormal weight loss Eyes: Denies blurriness of vision Ears, nose, mouth, throat, and face: Denies mucositis or sore throat Respiratory: Denies cough, dyspnea or wheezes Cardiovascular: Denies palpitation, chest discomfort or lower extremity swelling Gastrointestinal:  Denies nausea, heartburn or change in bowel habits Skin: Denies abnormal skin rashes Lymphatics: Denies new lymphadenopathy or easy bruising Neurological:Denies numbness, tingling or new weaknesses Behavioral/Psych: Mood is stable, no new changes  All other systems were reviewed with the patient and are negative.  I have reviewed the past medical history, past surgical history, social history and family history with the patient and they are unchanged from previous note.  ALLERGIES:  is allergic to codeine; lactulose; and  latex.  MEDICATIONS:  Current Outpatient Medications  Medication Sig Dispense Refill  . apixaban (ELIQUIS) 5 MG TABS tablet Take 1 tablet (5 mg total) by mouth 2 (two) times daily. 60 tablet 5  . atorvastatin (LIPITOR) 40 MG tablet Take 40 mg by mouth at bedtime.    . gabapentin (NEURONTIN) 600 MG tablet Take 1 tablet (600 mg total) by mouth 2 (two) times daily. 60 tablet 11  . lidocaine-prilocaine (EMLA) cream Apply to Porta-Cath 1-2 hours prior to access as directed. 30 g 1  . metoprolol succinate (TOPROL-XL) 25 MG 24 hr tablet Take 25 mg by mouth daily.   5  . Multiple Vitamin (MULTIVITAMIN WITH MINERALS) TABS tablet Take 1 tablet by mouth daily.    Marland Kitchen omega-3 acid ethyl esters (LOVAZA) 1 g capsule Take 1 g by mouth daily.    . ondansetron (ZOFRAN ODT) 4 MG disintegrating tablet Take 1 tablet (4 mg total) by mouth every 8 (eight) hours as needed for nausea or vomiting. 60 tablet 9  . oxyCODONE (OXY IR/ROXICODONE) 5 MG immediate release tablet Take 1 tablet (5 mg total) by mouth every 6 (six) hours as needed for severe pain. 60 tablet 0  . PARoxetine (PAXIL) 40 MG tablet Take 40 mg by mouth daily.  0  . polyethylene glycol (MIRALAX / GLYCOLAX) packet Take 17 g by mouth daily.    . verapamil (CALAN-SR) 240 MG CR tablet Take 1 tablet (240 mg total) by mouth daily. 30 tablet 0   No current facility-administered medications for this visit.   Facility-Administered Medications Ordered in Other Visits  Medication Dose Route Frequency Provider Last Rate Last Admin  . sodium chloride flush (NS) 0.9 % injection 10 mL  10 mL Intracatheter PRN Alvy Bimler,  Earnestene Angello, MD   10 mL at 04/11/20 1241    PHYSICAL EXAMINATION: ECOG PERFORMANCE STATUS: 2 - Symptomatic, <50% confined to bed  Vitals:   04/11/20 1043  BP: 122/70  Pulse: (!) 52  Resp: 18  Temp: 98.5 F (36.9 C)  SpO2: 99%   Filed Weights   04/11/20 1043  Weight: 208 lb 12.8 oz (94.7 kg)    GENERAL:alert, no distress and comfortable SKIN:  skin color, texture, turgor are normal, no rashes or significant lesions EYES: normal, Conjunctiva are pink and non-injected, sclera clear OROPHARYNX:no exudate, no erythema and lips, buccal mucosa, and tongue normal  NECK: supple, thyroid normal size, non-tender, without nodularity LYMPH:  no palpable lymphadenopathy in the cervical, axillary or inguinal LUNGS: clear to auscultation and percussion with normal breathing effort HEART: regular rate & rhythm and no murmurs and no lower extremity edema ABDOMEN:abdomen soft, non-tender and normal bowel sounds Musculoskeletal:no cyanosis of digits and no clubbing  NEURO: alert & oriented x 3 with expressive dysphagia and neurological deficit  LABORATORY DATA:  I have reviewed the data as listed    Component Value Date/Time   NA 144 04/11/2020 1024   NA 142 12/08/2017 1051   K 4.1 04/11/2020 1024   K 4.4 12/08/2017 1051   CL 107 04/11/2020 1024   CL 105 06/14/2017 1430   CO2 26 04/11/2020 1024   CO2 28 12/08/2017 1051   GLUCOSE 102 (H) 04/11/2020 1024   GLUCOSE 97 12/08/2017 1051   BUN 12 04/11/2020 1024   BUN 16.2 12/08/2017 1051   CREATININE 0.84 04/11/2020 1024   CREATININE 0.9 12/08/2017 1051   CALCIUM 9.4 04/11/2020 1024   CALCIUM 9.5 12/08/2017 1051   PROT 7.3 04/11/2020 1024   PROT 6.9 12/08/2017 1051   ALBUMIN 3.2 (L) 04/11/2020 1024   ALBUMIN 3.6 12/08/2017 1051   AST 15 04/11/2020 1024   AST 18 12/08/2017 1051   ALT 15 04/11/2020 1024   ALT 20 12/08/2017 1051   ALKPHOS 122 04/11/2020 1024   ALKPHOS 131 12/08/2017 1051   BILITOT 0.6 04/11/2020 1024   BILITOT 0.70 12/08/2017 1051   GFRNONAA >60 04/11/2020 1024   GFRAA >60 04/11/2020 1024    No results found for: SPEP, UPEP  Lab Results  Component Value Date   WBC 4.4 04/11/2020   NEUTROABS 2.5 04/11/2020   HGB 11.7 (L) 04/11/2020   HCT 35.6 (L) 04/11/2020   MCV 107.9 (H) 04/11/2020   PLT 145 (L) 04/11/2020      Chemistry      Component Value Date/Time    NA 144 04/11/2020 1024   NA 142 12/08/2017 1051   K 4.1 04/11/2020 1024   K 4.4 12/08/2017 1051   CL 107 04/11/2020 1024   CL 105 06/14/2017 1430   CO2 26 04/11/2020 1024   CO2 28 12/08/2017 1051   BUN 12 04/11/2020 1024   BUN 16.2 12/08/2017 1051   CREATININE 0.84 04/11/2020 1024   CREATININE 0.9 12/08/2017 1051      Component Value Date/Time   CALCIUM 9.4 04/11/2020 1024   CALCIUM 9.5 12/08/2017 1051   ALKPHOS 122 04/11/2020 1024   ALKPHOS 131 12/08/2017 1051   AST 15 04/11/2020 1024   AST 18 12/08/2017 1051   ALT 15 04/11/2020 1024   ALT 20 12/08/2017 1051   BILITOT 0.6 04/11/2020 1024   BILITOT 0.70 12/08/2017 1051

## 2020-04-11 NOTE — Assessment & Plan Note (Signed)
She has overall stable disease on last imaging Small lymphadenopathy seen on CT imaging that appears a bit larger is of unknown significance I plan to repeat another imaging study in a few months, probably around early July In the meantime, we will continue treatment every other week

## 2020-04-11 NOTE — Assessment & Plan Note (Signed)
She has expressive dysphagia from prior history of stroke I recommend her daughter to come with her for doctor's visit appointment in the future to facilitate accurate communication 

## 2020-04-11 NOTE — Patient Instructions (Signed)
[facility] Discharge Instructions for Patients Receiving Chemotherapy  Today you received the following chemotherapy agents Gemzar To help prevent nausea and vomiting after your treatment, we encourage you to take your nausea medication as prescribed.  If you develop nausea and vomiting that is not controlled by your nausea medication, call the clinic.   BELOW ARE SYMPTOMS THAT SHOULD BE REPORTED IMMEDIATELY:  *FEVER GREATER THAN 100.5 F  *CHILLS WITH OR WITHOUT FEVER  NAUSEA AND VOMITING THAT IS NOT CONTROLLED WITH YOUR NAUSEA MEDICATION  *UNUSUAL SHORTNESS OF BREATH  *UNUSUAL BRUISING OR BLEEDING  TENDERNESS IN MOUTH AND THROAT WITH OR WITHOUT PRESENCE OF ULCERS  *URINARY PROBLEMS  *BOWEL PROBLEMS  UNUSUAL RASH Items with * indicate a potential emergency and should be followed up as soon as possible.  Feel free to call the clinic should you have any questions or concerns. The clinic phone number is [phone].  Please show the CHEMO ALERT CARD at check-in to the Emergency Department and triage nurse.   

## 2020-04-11 NOTE — Assessment & Plan Note (Signed)
She has consistent pancytopenia with each cycle of treatment We will proceed with treatment today She does not need transfusion support 

## 2020-04-19 NOTE — Progress Notes (Signed)
Pharmacist Chemotherapy Monitoring - Follow Up Assessment    I verify that I have reviewed each item in the below checklist:  . Regimen for the patient is scheduled for the appropriate day and plan matches scheduled date. Marland Kitchen Appropriate non-routine labs are ordered dependent on drug ordered. . If applicable, additional medications reviewed and ordered per protocol based on lifetime cumulative doses and/or treatment regimen.   Plan for follow-up and/or issues identified: No . I-vent associated with next due treatment: No . MD and/or nursing notified: No   Kennith Center, Pharm.D., CPP 04/19/2020@3 :35 PM

## 2020-04-25 ENCOUNTER — Inpatient Hospital Stay: Payer: Medicare (Managed Care)

## 2020-04-25 ENCOUNTER — Other Ambulatory Visit: Payer: Self-pay

## 2020-04-25 VITALS — BP 131/71 | HR 79 | Temp 98.6°F | Resp 18

## 2020-04-25 DIAGNOSIS — C562 Malignant neoplasm of left ovary: Secondary | ICD-10-CM

## 2020-04-25 DIAGNOSIS — C563 Malignant neoplasm of bilateral ovaries: Secondary | ICD-10-CM

## 2020-04-25 DIAGNOSIS — Z7189 Other specified counseling: Secondary | ICD-10-CM

## 2020-04-25 DIAGNOSIS — C800 Disseminated malignant neoplasm, unspecified: Secondary | ICD-10-CM

## 2020-04-25 DIAGNOSIS — Z95828 Presence of other vascular implants and grafts: Secondary | ICD-10-CM

## 2020-04-25 DIAGNOSIS — Z5111 Encounter for antineoplastic chemotherapy: Secondary | ICD-10-CM | POA: Diagnosis not present

## 2020-04-25 LAB — CMP (CANCER CENTER ONLY)
ALT: 21 U/L (ref 0–44)
AST: 19 U/L (ref 15–41)
Albumin: 3.2 g/dL — ABNORMAL LOW (ref 3.5–5.0)
Alkaline Phosphatase: 143 U/L — ABNORMAL HIGH (ref 38–126)
Anion gap: 9 (ref 5–15)
BUN: 10 mg/dL (ref 8–23)
CO2: 26 mmol/L (ref 22–32)
Calcium: 9.4 mg/dL (ref 8.9–10.3)
Chloride: 106 mmol/L (ref 98–111)
Creatinine: 0.76 mg/dL (ref 0.44–1.00)
GFR, Est AFR Am: >60 mL/min (ref >60)
GFR, Estimated: >60 mL/min (ref >60)
Glucose, Bld: 99 mg/dL (ref 70–99)
Potassium: 4.2 mmol/L (ref 3.5–5.1)
Sodium: 141 mmol/L (ref 135–145)
Total Bilirubin: 0.5 mg/dL (ref 0.3–1.2)
Total Protein: 7.3 g/dL (ref 6.5–8.1)

## 2020-04-25 LAB — CBC WITH DIFFERENTIAL (CANCER CENTER ONLY)
Abs Immature Granulocytes: 0.02 10*3/uL (ref 0.00–0.07)
Basophils Absolute: 0 10*3/uL (ref 0.0–0.1)
Basophils Relative: 0 %
Eosinophils Absolute: 0.1 10*3/uL (ref 0.0–0.5)
Eosinophils Relative: 2 %
HCT: 34.2 % — ABNORMAL LOW (ref 36.0–46.0)
Hemoglobin: 11.1 g/dL — ABNORMAL LOW (ref 12.0–15.0)
Immature Granulocytes: 0 %
Lymphocytes Relative: 19 %
Lymphs Abs: 1.2 10*3/uL (ref 0.7–4.0)
MCH: 34.6 pg — ABNORMAL HIGH (ref 26.0–34.0)
MCHC: 32.5 g/dL (ref 30.0–36.0)
MCV: 106.5 fL — ABNORMAL HIGH (ref 80.0–100.0)
Monocytes Absolute: 0.9 10*3/uL (ref 0.1–1.0)
Monocytes Relative: 14 %
Neutro Abs: 4.1 10*3/uL (ref 1.7–7.7)
Neutrophils Relative %: 65 %
Platelet Count: 214 10*3/uL (ref 150–400)
RBC: 3.21 MIL/uL — ABNORMAL LOW (ref 3.87–5.11)
RDW: 17.1 % — ABNORMAL HIGH (ref 11.5–15.5)
WBC Count: 6.3 10*3/uL (ref 4.0–10.5)
nRBC: 0 % (ref 0.0–0.2)

## 2020-04-25 MED ORDER — SODIUM CHLORIDE 0.9% FLUSH
10.0000 mL | Freq: Once | INTRAVENOUS | Status: AC
  Administered 2020-04-25: 10 mL
  Filled 2020-04-25: qty 10

## 2020-04-25 MED ORDER — PROCHLORPERAZINE MALEATE 10 MG PO TABS
10.0000 mg | ORAL_TABLET | Freq: Once | ORAL | Status: AC
  Administered 2020-04-25: 10 mg via ORAL

## 2020-04-25 MED ORDER — HEPARIN SOD (PORK) LOCK FLUSH 100 UNIT/ML IV SOLN
500.0000 [IU] | Freq: Once | INTRAVENOUS | Status: AC | PRN
  Administered 2020-04-25: 500 [IU]
  Filled 2020-04-25: qty 5

## 2020-04-25 MED ORDER — SODIUM CHLORIDE 0.9 % IV SOLN
600.0000 mg/m2 | Freq: Once | INTRAVENOUS | Status: AC
  Administered 2020-04-25: 1292 mg via INTRAVENOUS
  Filled 2020-04-25: qty 33.98

## 2020-04-25 MED ORDER — SODIUM CHLORIDE 0.9% FLUSH
10.0000 mL | INTRAVENOUS | Status: DC | PRN
  Administered 2020-04-25: 10 mL
  Filled 2020-04-25: qty 10

## 2020-04-25 MED ORDER — PROCHLORPERAZINE MALEATE 10 MG PO TABS
ORAL_TABLET | ORAL | Status: AC
  Filled 2020-04-25: qty 1

## 2020-04-25 MED ORDER — SODIUM CHLORIDE 0.9 % IV SOLN
Freq: Once | INTRAVENOUS | Status: AC
  Filled 2020-04-25: qty 250

## 2020-05-09 ENCOUNTER — Encounter: Payer: Self-pay | Admitting: Hematology and Oncology

## 2020-05-09 ENCOUNTER — Inpatient Hospital Stay: Payer: Medicare (Managed Care)

## 2020-05-09 ENCOUNTER — Inpatient Hospital Stay (HOSPITAL_BASED_OUTPATIENT_CLINIC_OR_DEPARTMENT_OTHER): Payer: Medicare (Managed Care) | Admitting: Hematology and Oncology

## 2020-05-09 ENCOUNTER — Other Ambulatory Visit: Payer: Self-pay

## 2020-05-09 ENCOUNTER — Inpatient Hospital Stay: Payer: Medicare (Managed Care) | Attending: Hematology and Oncology

## 2020-05-09 DIAGNOSIS — C562 Malignant neoplasm of left ovary: Secondary | ICD-10-CM

## 2020-05-09 DIAGNOSIS — Z7901 Long term (current) use of anticoagulants: Secondary | ICD-10-CM | POA: Diagnosis not present

## 2020-05-09 DIAGNOSIS — C800 Disseminated malignant neoplasm, unspecified: Secondary | ICD-10-CM

## 2020-05-09 DIAGNOSIS — D61810 Antineoplastic chemotherapy induced pancytopenia: Secondary | ICD-10-CM | POA: Diagnosis not present

## 2020-05-09 DIAGNOSIS — R1310 Dysphagia, unspecified: Secondary | ICD-10-CM | POA: Diagnosis not present

## 2020-05-09 DIAGNOSIS — C786 Secondary malignant neoplasm of retroperitoneum and peritoneum: Secondary | ICD-10-CM | POA: Diagnosis not present

## 2020-05-09 DIAGNOSIS — Z95828 Presence of other vascular implants and grafts: Secondary | ICD-10-CM

## 2020-05-09 DIAGNOSIS — Z8673 Personal history of transient ischemic attack (TIA), and cerebral infarction without residual deficits: Secondary | ICD-10-CM | POA: Diagnosis not present

## 2020-05-09 DIAGNOSIS — C561 Malignant neoplasm of right ovary: Secondary | ICD-10-CM | POA: Diagnosis not present

## 2020-05-09 DIAGNOSIS — R4702 Dysphasia: Secondary | ICD-10-CM

## 2020-05-09 DIAGNOSIS — C563 Malignant neoplasm of bilateral ovaries: Secondary | ICD-10-CM

## 2020-05-09 DIAGNOSIS — I700 Atherosclerosis of aorta: Secondary | ICD-10-CM | POA: Diagnosis not present

## 2020-05-09 DIAGNOSIS — D61818 Other pancytopenia: Secondary | ICD-10-CM | POA: Diagnosis not present

## 2020-05-09 DIAGNOSIS — Z9221 Personal history of antineoplastic chemotherapy: Secondary | ICD-10-CM | POA: Diagnosis not present

## 2020-05-09 DIAGNOSIS — Z79899 Other long term (current) drug therapy: Secondary | ICD-10-CM | POA: Diagnosis not present

## 2020-05-09 DIAGNOSIS — Z5111 Encounter for antineoplastic chemotherapy: Secondary | ICD-10-CM | POA: Diagnosis present

## 2020-05-09 DIAGNOSIS — Z7189 Other specified counseling: Secondary | ICD-10-CM

## 2020-05-09 LAB — CBC WITH DIFFERENTIAL (CANCER CENTER ONLY)
Abs Immature Granulocytes: 0.01 10*3/uL (ref 0.00–0.07)
Basophils Absolute: 0 10*3/uL (ref 0.0–0.1)
Basophils Relative: 1 %
Eosinophils Absolute: 0.2 10*3/uL (ref 0.0–0.5)
Eosinophils Relative: 4 %
HCT: 35.6 % — ABNORMAL LOW (ref 36.0–46.0)
Hemoglobin: 11.9 g/dL — ABNORMAL LOW (ref 12.0–15.0)
Immature Granulocytes: 0 %
Lymphocytes Relative: 39 %
Lymphs Abs: 1.6 10*3/uL (ref 0.7–4.0)
MCH: 35.3 pg — ABNORMAL HIGH (ref 26.0–34.0)
MCHC: 33.4 g/dL (ref 30.0–36.0)
MCV: 105.6 fL — ABNORMAL HIGH (ref 80.0–100.0)
Monocytes Absolute: 0.6 10*3/uL (ref 0.1–1.0)
Monocytes Relative: 16 %
Neutro Abs: 1.6 10*3/uL — ABNORMAL LOW (ref 1.7–7.7)
Neutrophils Relative %: 40 %
Platelet Count: 143 10*3/uL — ABNORMAL LOW (ref 150–400)
RBC: 3.37 MIL/uL — ABNORMAL LOW (ref 3.87–5.11)
RDW: 17.5 % — ABNORMAL HIGH (ref 11.5–15.5)
WBC Count: 4.1 10*3/uL (ref 4.0–10.5)
nRBC: 0 % (ref 0.0–0.2)

## 2020-05-09 LAB — CMP (CANCER CENTER ONLY)
ALT: 20 U/L (ref 0–44)
AST: 17 U/L (ref 15–41)
Albumin: 3.2 g/dL — ABNORMAL LOW (ref 3.5–5.0)
Alkaline Phosphatase: 137 U/L — ABNORMAL HIGH (ref 38–126)
Anion gap: 9 (ref 5–15)
BUN: 9 mg/dL (ref 8–23)
CO2: 24 mmol/L (ref 22–32)
Calcium: 9.2 mg/dL (ref 8.9–10.3)
Chloride: 106 mmol/L (ref 98–111)
Creatinine: 0.87 mg/dL (ref 0.44–1.00)
GFR, Est AFR Am: >60 mL/min (ref >60)
GFR, Estimated: >60 mL/min (ref >60)
Glucose, Bld: 106 mg/dL — ABNORMAL HIGH (ref 70–99)
Potassium: 4 mmol/L (ref 3.5–5.1)
Sodium: 139 mmol/L (ref 135–145)
Total Bilirubin: 0.8 mg/dL (ref 0.3–1.2)
Total Protein: 7.3 g/dL (ref 6.5–8.1)

## 2020-05-09 MED ORDER — HEPARIN SOD (PORK) LOCK FLUSH 100 UNIT/ML IV SOLN
500.0000 [IU] | Freq: Once | INTRAVENOUS | Status: AC | PRN
  Administered 2020-05-09: 500 [IU]
  Filled 2020-05-09: qty 5

## 2020-05-09 MED ORDER — SODIUM CHLORIDE 0.9 % IV SOLN
600.0000 mg/m2 | Freq: Once | INTRAVENOUS | Status: AC
  Administered 2020-05-09: 1292 mg via INTRAVENOUS
  Filled 2020-05-09: qty 33.98

## 2020-05-09 MED ORDER — SODIUM CHLORIDE 0.9 % IV SOLN
Freq: Once | INTRAVENOUS | Status: AC
  Filled 2020-05-09: qty 250

## 2020-05-09 MED ORDER — SODIUM CHLORIDE 0.9% FLUSH
10.0000 mL | Freq: Once | INTRAVENOUS | Status: AC
  Administered 2020-05-09: 10 mL
  Filled 2020-05-09: qty 10

## 2020-05-09 MED ORDER — PROCHLORPERAZINE MALEATE 10 MG PO TABS
ORAL_TABLET | ORAL | Status: AC
  Filled 2020-05-09: qty 1

## 2020-05-09 MED ORDER — SODIUM CHLORIDE 0.9% FLUSH
10.0000 mL | INTRAVENOUS | Status: DC | PRN
  Administered 2020-05-09: 10 mL
  Filled 2020-05-09: qty 10

## 2020-05-09 MED ORDER — PROCHLORPERAZINE MALEATE 10 MG PO TABS
10.0000 mg | ORAL_TABLET | Freq: Once | ORAL | Status: AC
  Administered 2020-05-09: 10 mg via ORAL

## 2020-05-09 NOTE — Assessment & Plan Note (Signed)
She has consistent pancytopenia with each cycle of treatment We will proceed with treatment today She does not need transfusion support 

## 2020-05-09 NOTE — Progress Notes (Signed)
[facility] OFFICE PROGRESS NOTE  Patient Care Team: Helane Rima, MD as PCP - General (Family Medicine) Encarnacion Slates, MD as Referring Physician (Neurology) Cameron Sprang, MD as Consulting Physician (Neurology)  ASSESSMENT & PLAN:  Ovarian cancer [facility]) She has overall stable disease on last imaging Small lymphadenopathy seen on CT imaging that appears a bit larger is of unknown significance I plan to repeat another imaging study in a few months, probably around July In the meantime, we will continue treatment every other week  Pancytopenia, acquired [facility]) She has consistent pancytopenia with each cycle of treatment We will proceed with treatment today She does not need transfusion support  Expressive dysphasia She has expressive dysphagia from prior history of stroke I recommend her daughter to come with her for doctor's visit appointment in the future to facilitate accurate communication   No orders of the defined types were placed in this encounter.   All questions were answered. The patient knows to call the clinic with any problems, questions or concerns. The total time spent in the appointment was 20 minutes encounter with patients including review of chart and various tests results, discussions about plan of care and coordination of care plan   Heath Lark, MD 05/09/2020 3:30 PM  INTERVAL HISTORY: Please see below for problem oriented charting. She returns for further follow-up Daughter is not present She is doing well otherwise Denies pain no nausea No recent bleeding  SUMMARY OF ONCOLOGIC HISTORY: Oncology History Overview Note  Negative genetic testing Progressed on Niraparib   Carcinomatosis ([place])  12/06/2015 Initial Diagnosis   Carcinomatosis ([place])   Ovarian cancer, bilateral ([place])  12/24/2015 Initial Diagnosis   Ovarian cancer, bilateral ([place])   Ovarian cancer ([place])  11/28/2015 Imaging   Ct scan of abdomen: Peritoneal carcinomatosis  and pelvic/inguinal adenopathy. Gynecologic primary is favored.   12/17/2015 Pathology Results   Lymph node, needle/core biopsy, L inguinal LAN - METASTATIC ADENOCARCINOMA. Microscopic Comment Immunohistochemistry will be performed and reported as an addendum. (JDP:ecj 12/18/2015) ADDENDUM: Immunohistochemistry shows strong positivity with cytokeratin 7, estrogen receptor (ER), progesterone receptor (PR), p53 and WT1. Negative markers are cytokeratin 20 , CDX- 2 and gross cystic disease fluid protein. 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 (JDP:kh 12-19-15   12/17/2015 Procedure   Technically successful ultrasound guided core left inguinal adenopathy biopsy   12/24/2015 Tumor Marker   Patient's tumor was tested for the following markers: CA-125 Results of the tumor marker test revealed 608.2   12/27/2015 - 02/28/2016 Chemotherapy   She received neoadjuvant chemo x 3 cycles   01/01/2016 Tumor Marker   Patient's tumor was tested for the following markers: CA-125 Results of the tumor marker test revealed 741.6   01/29/2016 Procedure   Successful placement of a right internal jugular approach power injectable Port-A-Cath. The catheter is ready for immediate use   01/29/2016 Imaging   US abdomen 1. Hyperechoic 2.5 x 1.0 x 1.6 cm lesion in the region the caudate lobe of the liver. Similar finding noted on prior CT of 11/28/2015. This could represent hemangioma. This could represent a malignancy including metastatic disease. 2. Exam otherwise unremarkable. No gallstones. No biliary distention.   02/03/2016 Tumor Marker   Patient's tumor was tested for the following markers: CA-125 Results of the tumor marker test revealed 137.1   02/20/2016 Imaging   MRI brain No acute infarct.  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  No intracranial mass.  Moderate small vessel  disease changes.  Global atrophy without hydrocephalus.  Expanded partially empty sella without secondary findings of pseudotumor cerebri.  Minimal mucosal thickening ethmoid sinus air cells. Small air-fluid levels maxillary sinuses bilaterally may indicate changes of acute sinusitis.    02/28/2016 Tumor Marker   Patient's tumor was tested for the following markers: CA-125 Results of the tumor marker test revealed 33.6   03/02/2016 Imaging   CT chest, abdomen and pelvis 1. Today's study demonstrates a positive response to therapy with resolution of the previously noted malignant ascites, significant regression of previously noted peritoneal implants, and regression of previously noted lymphadenopathy in the abdomen and pelvis. 2. No definite evidence to suggest metastatic disease to the thorax. 3. Stable 1.0 x 1.7 cm intermediate attenuation lesion associated with the posterior aspect of segment 1 of the liver, favored to represent a mildly proteinaceous hepatic cyst. The possibility of a peritoneal implant in this region is not entirely excluded, but is not favored on today's examination. 4. Extensive post infectious scarring inguinal upper right lung, likely sequela of prior necrotizing pneumonia. 5. Multiple tiny pulmonary nodules scattered throughout the lungs bilaterally all measuring 4 mm or less. These are nonspecific, but favored to be benign. Given the patient's history of primary gynecologic malignancy, attention on followup studies is recommended to ensure the stability of these findings. 6. Additional incidental findings, as above   03/14/2016 Imaging   CT angiogram 1. No pulmonary emboli. 2. Chronic changes to the right upper lobe. 3. Stable lesion in the caudate lobe of the liver. 4. Stable soft tissue prominence to the right of the trachea as described above. 5. Stable small nodule in the right lung.    03/24/2016 Pathology Results   1. Omentum, resection for tumor -  FOCAL ADENOCARCINOMA ASSOCIATED WITH EXTENSIVE FIBROSIS, INFLAMMATION AND HEMOSIDERIN DEPOSITION. 2. Uterus +/- tubes/ovaries, neoplastic, cervix - CERVIX: SLIGHT CERVICITIS AND SQUAMOUS METAPLASIA. - ENDOMETRIUM: ATROPHIC, NO HYPERPLASIA OR MALIGNANCY. - MYOMETRIUM: LEIOMYOMATA WITH DEGENERATIVE CHANGES. NO MALIGNANCY. - UTERINE SEROSA: FOCAL ADENOCARCINOMA ASSOCIATED WITH ADHESIONS. - RIGHT OVARY: BENIGN CALCIFIED NODULE AND BENIGN SEROUS CYST. NO MALIGNANCY IDENTIFIED. - RIGHT FALLOPIAN TUBE: UNREMARKABLE. NO MALIGNANCY IDENTIFIED. - LEFT OVARY: FOCAL ADENOCARCINOMA ASSOCIATED WITH EXTENSIVE FIBROSIS. - LEFT FALLOPIAN TUBE: HYDROSALPINX. NO MALIGNANCY IDENTIFIED. 3. Lymph node, biopsy, right external iliac - METASTATIC ADENOCARCINOMA, 4. Soft tissue, biopsy, left para colic gutter - FOCAL ADENOCARCINOMA ASSOCIATED WITH FIBROSIS. Microscopic Comment 2. OVARY Specimen(s): Uterus with bilateral ovaries, omentum, right external iliac lymph node and left paracolic gutter. Procedure: (including lymph node sampling) Hysterectomy with bilateral salpingo-oophorectomy, omentectomy, lymph node biopsy and paracolic gutter biopsy. Primary tumor site (including laterality): Left ovary. Ovarian surface involvement: Yes Ovarian capsule intact without fragmentation: N/A Maximum tumor size (cm): 2 cm, 2 cm Histologic type: Serous carcinoma. Grade: 3 Peritoneal implants: (specify invasive or non-invasive): Left paracolic gutter positive for carcinoma Pelvic extension (list additional structures on separate lines and if involved): Omentum, right paracolic gutter and uterine serosa. Lymph nodes: number examined 1 ; number positive 1 TNM code: ypT3a, ypN1b, ypMX FIGO Stage (based on pathologic findings, needs clinical correlation): IIIA2 Comments: The left ovary has a 2 cm nodule, which is composed primarily of fibrous tissue with small foci of residual adenocarcinoma. There is also microscopic involvement  of the uterine serosa, the left paracolic gutter biopsy and the omentum. The left paracolic gutter and omental involvement is microscopic and both are associated with extensive fibrosis with focal hemosiderin deposition. The right  external iliac node is completely replaced by metastatic adenocarcinoma with serous features. (JDP:ecj 03/30/2016)   03/24/2016 Surgery   Operation: Robotic-assisted laparoscopic total hysterectomy with bilateral salpingoophorectomy, omentectomy, radical tumor debulking  Surgeon: Donaciano Eva  Operative Findings:  : small nodules in omentum, no ascites. Omental nodule (2cm) adherent to lower anterior abdominal wall. 2cm left colonic gutter cystic nodule. 6cm right external iliac lymph node. Grossly normal ovaries and tubes. Fibroid uterus. No gross residual disease at completion of surgery representing R0 optimal/complete resection.    04/17/2016 Imaging   CT abdomen and pelvis 1. There is no evidence of acute inflammatory process within abdomen or pelvis. 2. Stable low-density lesion within caudate lobe of the liver. No new hepatic lesions are noted. 3. Again noted lobulated renal contour and multifocal renal cortical scarring. No hydronephrosis or hydroureter. 4. No significant mesenteric adenopathy. No retroperitoneal adenopathy. Stable minimal residual peritoneal thickening within pelvis. No new pelvic implants or pelvic ascites. 5. Status post hysterectomy.  Unremarkable urinary bladder. 6. Moderate stool within colon as described above. No evidence of colitis.    04/24/2016 - 06/19/2016 Chemotherapy   She received 3 more cycles of chemo after surgery   05/14/2016 Tumor Marker   Patient's tumor was tested for the following markers: CA-125 Results of the tumor marker test revealed 22.5   06/04/2016 Tumor Marker   Patient's tumor was tested for the following markers: CA-125 Results of the tumor marker test revealed 13.7   07/07/2016 Genetic Testing    Patient has genetic testing done for breast/ovasrian cancer panel Results revealed patient has no actionable mutation   07/23/2016 Imaging   Ct abdomen and pelvis 1. Heterogeneously enhancing 4.9 x 3.2 x 4.4 cm mass along the right pelvic sidewall may represent locally recurrent disease or malignant lymphadenopathy. 2. No other signs of definite metastatic disease noted elsewhere in the abdomen or pelvis. 3. Stable low-attenuation hepatic lesion in the caudate lobe of the liver, which appears to demonstrates some progressive centripetal filling on delayed images. This lesion is presumably benign given its stability compared to prior studies, and is favored to represent a small cavernous hemangioma. 4. Cardiomegaly with biatrial dilatation, which is very severe on the right side. 5. Aortic atherosclerosis. 6. Additional incidental findings, as above.   07/23/2016 Tumor Marker   Patient's tumor was tested for the following markers: CA-125 Results of the tumor marker test revealed 12.3   09/30/2016 Imaging   Ct abdomen and pelvis: 1. 4.9 cm heterogeneously enhancing mass seen along the right pelvic sidewall previously has almost completely resolved. There is some ill-defined soft tissue attenuation/peritoneal thickening in this region today but no discrete measurable lesion is evident. 2. No new or progressive findings on today's exam. 3. Stable hypo attenuating lesion in the caudate lobe of the liver. 4. Subtle aortic atherosclerosis   09/30/2016 Tumor Marker   Patient's tumor was tested for the following markers: CA-125 Results of the tumor marker test revealed 9.6   10/05/2016 Pathology Results   Vagina, biopsy, left cuff - BENIGN FIBROEPITHELIAL (STROMAL) POLYP. - NO DYSPLASIA, ATYPIA OR MALIGNANCY IDENTIFIED.   01/14/2017 Tumor Marker   Patient's tumor was tested for the following markers: CA-125 Results of the tumor marker test revealed 11.9   05/05/2017 Tumor Marker   Patient's  tumor was tested for the following markers: CA-125 Results of the tumor marker test revealed 28   06/14/2017 Tumor Marker   Patient's tumor was tested for the following markers: CA-125 Results of the tumor  marker test revealed 45.7   06/24/2017 Imaging   Ct abdomen and pelvis: Increased peritoneal metastatic disease in abdomen and pelvis since prior exam. No evidence of ascites. Stable small benign hepatic hemangioma.   07/21/2017 Tumor Marker   Patient's tumor was tested for the following markers: CA-125 Results of the tumor marker test revealed 84.1   07/21/2017 - 11/03/2017 Chemotherapy   She received carboplatin and taxol   08/11/2017 Adverse Reaction   She had severe neuropathy due to treatment. Does of chemo is reduced starting cycle 2 onwards   09/02/2017 Tumor Marker   Patient's tumor was tested for the following markers: CA-125 Results of the tumor marker test revealed 27.7   09/16/2017 Imaging   CT CHEST IMPRESSION  1. No acute process or evidence of metastatic disease in the chest. 2. Right upper lung bronchiectasis, consolidation, and architectural distortion are most likely post infectious or inflammatory and not significantly changed. 3. Aortic Atherosclerosis (ICD10-I70.0).  CT ABDOMEN AND PELVIS IMPRESSION  1. Response to therapy of nodal and peritoneal metastasis. 2. No new or progressive disease. 3. Caudate lobe hemangioma, as before. 4. Bilateral renal cortical thinning. Similar minimal right-sided caliectasis and hydroureter. Likely secondary to low-grade obstruction by the dominant right pelvic implant.   09/22/2017 Tumor Marker   Patient's tumor was tested for the following markers: CA-125 Results of the tumor marker test revealed 18   11/03/2017 Tumor Marker   Patient's tumor was tested for the following markers: CA-125 Results of the tumor marker test revealed 13.4   11/25/2017 Imaging   1. Stable to slight interval decrease in size of nodal and  peritoneal metastatic disease within the abdomen. 2. No new or progressive disease.   12/03/2017 - 09/11/2019 Chemotherapy   The patient is prescribed Ze[place]ula   01/17/2018 Tumor Marker   Patient's tumor was tested for the following markers: CA-125 Results of the tumor marker test revealed 11.9   02/22/2018 Tumor Marker   Patient's tumor was tested for the following markers: CA-125 Results of the tumor marker test revealed 11.7   02/22/2018 Imaging   1. Improved nodular thickening along the vaginal cuff on the left and medial to the right iliac vessels, likely treated implants. No evidence of progressive peritoneal disease. 2. Stable hepatic hemangioma. No evidence of solid visceral organ metastases. 3. Bilateral renal cortical scarring.   05/19/2018 Tumor Marker   Patient's tumor was tested for the following markers: CA-125 Results of the tumor marker test revealed 9.9   08/10/2018 Imaging   Stable mild nodular thickening of the left vaginal cuff. No new or progressive disease within the abdomen or pelvis.  Stable small hepatic hemangioma.   09/21/2018 Tumor Marker   Patient's tumor was tested for the following markers: CA-125 Results of the tumor marker test revealed 10.3   11/11/2018 Tumor Marker   Patient's tumor was tested for the following markers: CA-125 Results of the tumor marker test revealed 10.5   12/15/2018 Tumor Marker   Patient's tumor was tested for the following markers: CA-125 Results of the tumor marker test revealed 12.6   01/06/2019 Tumor Marker   Patient's tumor was tested for the following markers: CA-125 Results of the tumor marker test revealed 14.6   01/12/2019 Imaging   1. Chronic extensive right upper lobe scarring changes and bronchiectasis. 2. No mediastinal or hilar mass or adenopathy and no findings for pulmonary metastatic disease. 3. Stable caudate lobe hemangioma. 4. No worrisome omental or peritoneal surface lesions. 5. Stable pericecal and  small  pelvic lymph nodes. No new or progressive findings.    09/11/2019 Imaging   Ct abdomen and pelvis Pericecal peritoneal metastatic implants   09/19/2019 -  Chemotherapy   The patient had carboplatin and gemxar for chemotherapy treatment.     11/20/2019 Imaging   1. Mild decrease in size of peritoneal tumor implant along the lateral aspect of the cecum. 2. Stable tiny sub-cm mesenteric soft tissue nodules or lymph nodes in right lower quadrant and anterior pelvic mesentery. 3. No new or progressive disease identified within the abdomen or pelvis.   03/12/2020 Imaging   1. No acute findings within the abdomen or pelvis. 2. Stable appearance of right lower quadrant peritoneal implant adjacent to cecum. 3. Increase in size of right lower quadrant ileocolic lymph node. 1.2 cm on today's study versus 0.8 previously. 4. Possible early avascular necrosis involving the left femoral head.   Aortic Atherosclerosis (ICD10-I70.0).     REVIEW OF SYSTEMS:   Constitutional: Denies fevers, chills or abnormal weight loss Eyes: Denies blurriness of vision Ears, nose, mouth, throat, and face: Denies mucositis or sore throat Respiratory: Denies cough, dyspnea or wheezes Cardiovascular: Denies palpitation, chest discomfort or lower extremity swelling Gastrointestinal:  Denies nausea, heartburn or change in bowel habits Skin: Denies abnormal skin rashes Lymphatics: Denies new lymphadenopathy or easy bruising Neurological:Denies numbness, tingling or new weaknesses Behavioral/Psych: Mood is stable, no new changes  All other systems were reviewed with the patient and are negative.  I have reviewed the past medical history, past surgical history, social history and family history with the patient and they are unchanged from previous note.  ALLERGIES:  is allergic to codeine; lactulose; and latex.  MEDICATIONS:  Current Outpatient Medications  Medication Sig Dispense Refill  . apixaban (ELIQUIS) 5 MG  TABS tablet Take 1 tablet (5 mg total) by mouth 2 (two) times daily. 60 tablet 5  . atorvastatin (LIPITOR) 40 MG tablet Take 40 mg by mouth at bedtime.    . gabapentin (NEURONTIN) 600 MG tablet Take 1 tablet (600 mg total) by mouth 2 (two) times daily. 60 tablet 11  . lidocaine-prilocaine (EMLA) cream Apply to Porta-Cath 1-2 hours prior to access as directed. 30 g 1  . metoprolol succinate (TOPROL-XL) 25 MG 24 hr tablet Take 25 mg by mouth daily.   5  . Multiple Vitamin (MULTIVITAMIN WITH MINERALS) TABS tablet Take 1 tablet by mouth daily.    Marland Kitchen omega-3 acid ethyl esters (LOVAZA) 1 g capsule Take 1 g by mouth daily.    . ondansetron (ZOFRAN ODT) 4 MG disintegrating tablet Take 1 tablet (4 mg total) by mouth every 8 (eight) hours as needed for nausea or vomiting. 60 tablet 9  . oxyCODONE (OXY IR/ROXICODONE) 5 MG immediate release tablet Take 1 tablet (5 mg total) by mouth every 6 (six) hours as needed for severe pain. 60 tablet 0  . PARoxetine (PAXIL) 40 MG tablet Take 40 mg by mouth daily.  0  . polyethylene glycol (MIRALAX / GLYCOLAX) packet Take 17 g by mouth daily.     No current facility-administered medications for this visit.   Facility-Administered Medications Ordered in Other Visits  Medication Dose Route Frequency Provider Last Rate Last Admin  . sodium chloride flush (NS) 0.9 % injection 10 mL  10 mL Intracatheter PRN Alvy Bimler, Liany Mumpower, MD   10 mL at 05/09/20 1334    PHYSICAL EXAMINATION: ECOG PERFORMANCE STATUS: 2 - Symptomatic, <50% confined to bed  Vitals:   05/09/20 1058  BP: 122/74  Pulse: 78  Resp: 17  Temp: 99.1 F (37.3 C)  SpO2: 99%   Filed Weights   05/09/20 1058  Weight: 202 lb 14.4 oz (92 kg)    GENERAL:alert, no distress and comfortable SKIN: skin color, texture, turgor are normal, no rashes or significant lesions EYES: normal, Conjunctiva are pink and non-injected, sclera clear OROPHARYNX:no exudate, no erythema and lips, buccal mucosa, and tongue normal   NECK: supple, thyroid normal size, non-tender, without nodularity LYMPH:  no palpable lymphadenopathy in the cervical, axillary or inguinal LUNGS: clear to auscultation and percussion with normal breathing effort HEART: regular rate & rhythm and no murmurs and no lower extremity edema ABDOMEN:abdomen soft, non-tender and normal bowel sounds Musculoskeletal:no cyanosis of digits and no clubbing  NEURO: alert & oriented x 3 with expressive dysphagia, chronic neurological deficit, unchanged,  LABORATORY DATA:  I have reviewed the data as listed    Component Value Date/Time   NA 139 05/09/2020 1040   NA 142 12/08/2017 1051   K 4.0 05/09/2020 1040   K 4.4 12/08/2017 1051   CL 106 05/09/2020 1040   CL 105 06/14/2017 1430   CO2 24 05/09/2020 1040   CO2 28 12/08/2017 1051   GLUCOSE 106 (H) 05/09/2020 1040   GLUCOSE 97 12/08/2017 1051   BUN 9 05/09/2020 1040   BUN 16.2 12/08/2017 1051   CREATININE 0.87 05/09/2020 1040   CREATININE 0.9 12/08/2017 1051   CALCIUM 9.2 05/09/2020 1040   CALCIUM 9.5 12/08/2017 1051   PROT 7.3 05/09/2020 1040   PROT 6.9 12/08/2017 1051   ALBUMIN 3.2 (L) 05/09/2020 1040   ALBUMIN 3.6 12/08/2017 1051   AST 17 05/09/2020 1040   AST 18 12/08/2017 1051   ALT 20 05/09/2020 1040   ALT 20 12/08/2017 1051   ALKPHOS 137 (H) 05/09/2020 1040   ALKPHOS 131 12/08/2017 1051   BILITOT 0.8 05/09/2020 1040   BILITOT 0.70 12/08/2017 1051   GFRNONAA >60 05/09/2020 1040   GFRAA >60 05/09/2020 1040    No results found for: SPEP, UPEP  Lab Results  Component Value Date   WBC 4.1 05/09/2020   NEUTROABS 1.6 (L) 05/09/2020   HGB 11.9 (L) 05/09/2020   HCT 35.6 (L) 05/09/2020   MCV 105.6 (H) 05/09/2020   PLT 143 (L) 05/09/2020      Chemistry      Component Value Date/Time   NA 139 05/09/2020 1040   NA 142 12/08/2017 1051   K 4.0 05/09/2020 1040   K 4.4 12/08/2017 1051   CL 106 05/09/2020 1040   CL 105 06/14/2017 1430   CO2 24 05/09/2020 1040   CO2 28  12/08/2017 1051   BUN 9 05/09/2020 1040   BUN 16.2 12/08/2017 1051   CREATININE 0.87 05/09/2020 1040   CREATININE 0.9 12/08/2017 1051      Component Value Date/Time   CALCIUM 9.2 05/09/2020 1040   CALCIUM 9.5 12/08/2017 1051   ALKPHOS 137 (H) 05/09/2020 1040   ALKPHOS 131 12/08/2017 1051   AST 17 05/09/2020 1040   AST 18 12/08/2017 1051   ALT 20 05/09/2020 1040   ALT 20 12/08/2017 1051   BILITOT 0.8 05/09/2020 1040   BILITOT 0.70 12/08/2017 1051

## 2020-05-09 NOTE — Assessment & Plan Note (Signed)
She has expressive dysphagia from prior history of stroke I recommend her daughter to come with her for doctor's visit appointment in the future to facilitate accurate communication 

## 2020-05-09 NOTE — Patient Instructions (Signed)
[facility] °Discharge Instructions for Patients Receiving Chemotherapy ° °Today you received the following chemotherapy agents Gemzar ° °To help prevent nausea and vomiting after your treatment, we encourage you to take your nausea medication as directed. °  °If you develop nausea and vomiting that is not controlled by your nausea medication, call the clinic.  ° °BELOW ARE SYMPTOMS THAT SHOULD BE REPORTED IMMEDIATELY: °· *FEVER GREATER THAN 100.5 F °· *CHILLS WITH OR WITHOUT FEVER °· NAUSEA AND VOMITING THAT IS NOT CONTROLLED WITH YOUR NAUSEA MEDICATION °· *UNUSUAL SHORTNESS OF BREATH °· *UNUSUAL BRUISING OR BLEEDING °· TENDERNESS IN MOUTH AND THROAT WITH OR WITHOUT PRESENCE OF ULCERS °· *URINARY PROBLEMS °· *BOWEL PROBLEMS °· UNUSUAL RASH °Items with * indicate a potential emergency and should be followed up as soon as possible. ° °Feel free to call the clinic should you have any questions or concerns. The clinic phone number is [phone]. ° °Please show the CHEMO ALERT CARD at check-in to the Emergency Department and triage nurse. ° ° °

## 2020-05-09 NOTE — Assessment & Plan Note (Signed)
She has overall stable disease on last imaging Small lymphadenopathy seen on CT imaging that appears a bit larger is of unknown significance I plan to repeat another imaging study in a few months, probably around July In the meantime, we will continue treatment every other week

## 2020-05-10 ENCOUNTER — Telehealth: Payer: Self-pay | Admitting: Hematology and Oncology

## 2020-05-10 NOTE — Telephone Encounter (Signed)
Scheduled per 6/3 sch message. Unable to reach pt. Voicemail is not set up. Noted to give pt updated calendar on next visit.

## 2020-05-23 ENCOUNTER — Inpatient Hospital Stay: Payer: Medicare (Managed Care)

## 2020-05-23 ENCOUNTER — Other Ambulatory Visit: Payer: Self-pay

## 2020-05-23 ENCOUNTER — Telehealth: Payer: Self-pay

## 2020-05-23 VITALS — BP 135/80 | HR 68 | Temp 98.2°F | Resp 18

## 2020-05-23 DIAGNOSIS — C800 Disseminated malignant neoplasm, unspecified: Secondary | ICD-10-CM

## 2020-05-23 DIAGNOSIS — C562 Malignant neoplasm of left ovary: Secondary | ICD-10-CM

## 2020-05-23 DIAGNOSIS — Z95828 Presence of other vascular implants and grafts: Secondary | ICD-10-CM

## 2020-05-23 DIAGNOSIS — Z5111 Encounter for antineoplastic chemotherapy: Secondary | ICD-10-CM | POA: Diagnosis not present

## 2020-05-23 DIAGNOSIS — Z7189 Other specified counseling: Secondary | ICD-10-CM

## 2020-05-23 DIAGNOSIS — C563 Malignant neoplasm of bilateral ovaries: Secondary | ICD-10-CM

## 2020-05-23 LAB — CMP (CANCER CENTER ONLY)
ALT: 25 U/L (ref 0–44)
AST: 20 U/L (ref 15–41)
Albumin: 3.2 g/dL — ABNORMAL LOW (ref 3.5–5.0)
Alkaline Phosphatase: 130 U/L — ABNORMAL HIGH (ref 38–126)
Anion gap: 7 (ref 5–15)
BUN: 11 mg/dL (ref 8–23)
CO2: 25 mmol/L (ref 22–32)
Calcium: 9 mg/dL (ref 8.9–10.3)
Chloride: 107 mmol/L (ref 98–111)
Creatinine: 0.78 mg/dL (ref 0.44–1.00)
GFR, Est AFR Am: >60 mL/min (ref >60)
GFR, Estimated: >60 mL/min (ref >60)
Glucose, Bld: 95 mg/dL (ref 70–99)
Potassium: 4.1 mmol/L (ref 3.5–5.1)
Sodium: 139 mmol/L (ref 135–145)
Total Bilirubin: 0.7 mg/dL (ref 0.3–1.2)
Total Protein: 6.8 g/dL (ref 6.5–8.1)

## 2020-05-23 LAB — CBC WITH DIFFERENTIAL (CANCER CENTER ONLY)
Abs Immature Granulocytes: 0.01 10*3/uL (ref 0.00–0.07)
Basophils Absolute: 0 10*3/uL (ref 0.0–0.1)
Basophils Relative: 0 %
Eosinophils Absolute: 0.1 10*3/uL (ref 0.0–0.5)
Eosinophils Relative: 2 %
HCT: 36.2 % (ref 36.0–46.0)
Hemoglobin: 12.1 g/dL (ref 12.0–15.0)
Immature Granulocytes: 0 %
Lymphocytes Relative: 29 %
Lymphs Abs: 1.4 10*3/uL (ref 0.7–4.0)
MCH: 35.1 pg — ABNORMAL HIGH (ref 26.0–34.0)
MCHC: 33.4 g/dL (ref 30.0–36.0)
MCV: 104.9 fL — ABNORMAL HIGH (ref 80.0–100.0)
Monocytes Absolute: 0.7 10*3/uL (ref 0.1–1.0)
Monocytes Relative: 15 %
Neutro Abs: 2.6 10*3/uL (ref 1.7–7.7)
Neutrophils Relative %: 54 %
Platelet Count: 205 10*3/uL (ref 150–400)
RBC: 3.45 MIL/uL — ABNORMAL LOW (ref 3.87–5.11)
RDW: 18.2 % — ABNORMAL HIGH (ref 11.5–15.5)
WBC Count: 4.8 10*3/uL (ref 4.0–10.5)
nRBC: 0 % (ref 0.0–0.2)

## 2020-05-23 MED ORDER — SODIUM CHLORIDE 0.9 % IV SOLN
Freq: Once | INTRAVENOUS | Status: AC
  Filled 2020-05-23: qty 250

## 2020-05-23 MED ORDER — HEPARIN SOD (PORK) LOCK FLUSH 100 UNIT/ML IV SOLN
500.0000 [IU] | Freq: Once | INTRAVENOUS | Status: AC | PRN
  Administered 2020-05-23: 500 [IU]
  Filled 2020-05-23: qty 5

## 2020-05-23 MED ORDER — SODIUM CHLORIDE 0.9% FLUSH
10.0000 mL | Freq: Once | INTRAVENOUS | Status: AC
  Administered 2020-05-23: 10 mL
  Filled 2020-05-23: qty 10

## 2020-05-23 MED ORDER — SODIUM CHLORIDE 0.9% FLUSH
10.0000 mL | INTRAVENOUS | Status: DC | PRN
  Administered 2020-05-23: 10 mL
  Filled 2020-05-23: qty 10

## 2020-05-23 MED ORDER — SODIUM CHLORIDE 0.9 % IV SOLN
600.0000 mg/m2 | Freq: Once | INTRAVENOUS | Status: AC
  Administered 2020-05-23: 1292 mg via INTRAVENOUS
  Filled 2020-05-23: qty 33.98

## 2020-05-23 MED ORDER — PROCHLORPERAZINE MALEATE 10 MG PO TABS
10.0000 mg | ORAL_TABLET | Freq: Once | ORAL | Status: AC
  Administered 2020-05-23: 10 mg via ORAL

## 2020-05-23 MED ORDER — PROCHLORPERAZINE MALEATE 10 MG PO TABS
ORAL_TABLET | ORAL | Status: AC
  Filled 2020-05-23: qty 1

## 2020-05-23 NOTE — Telephone Encounter (Signed)
-----   Message from Heath Lark, MD sent at 05/21/2020  2:51 PM EDT ----- Regarding: move appt Keep chemo appt On 7/1: cancel her appt see me On 7/15: I see her at 1115 am, 15 mins

## 2020-05-23 NOTE — Patient Instructions (Signed)
[facility] Discharge Instructions for Patients Receiving Chemotherapy  Today you received the following chemotherapy agents: gemcitabine.  To help prevent nausea and vomiting after your treatment, we encourage you to take your nausea medication as directed.   If you develop nausea and vomiting that is not controlled by your nausea medication, call the clinic.   BELOW ARE SYMPTOMS THAT SHOULD BE REPORTED IMMEDIATELY:  *FEVER GREATER THAN 100.5 F  *CHILLS WITH OR WITHOUT FEVER  NAUSEA AND VOMITING THAT IS NOT CONTROLLED WITH YOUR NAUSEA MEDICATION  *UNUSUAL SHORTNESS OF BREATH  *UNUSUAL BRUISING OR BLEEDING  TENDERNESS IN MOUTH AND THROAT WITH OR WITHOUT PRESENCE OF ULCERS  *URINARY PROBLEMS  *BOWEL PROBLEMS  UNUSUAL RASH Items with * indicate a potential emergency and should be followed up as soon as possible.  Feel free to call the clinic should you have any questions or concerns. The clinic phone number is [phone].  Please show the CHEMO ALERT CARD at check-in to the Emergency Department and triage nurse.   

## 2020-05-23 NOTE — Telephone Encounter (Signed)
Called and given message to Abie, daughter. She verbalized understanding. Appts changed per message.

## 2020-05-30 ENCOUNTER — Telehealth: Payer: Self-pay

## 2020-05-30 NOTE — Telephone Encounter (Signed)
Daughter called and left a message to call her.  Called back. Her Mom is complaining of abdominal pain for the last few days. She is complaining of bad cramps on the right side of abdomen more so. She is taking her pain medication. Denies constipation. She thought it was gas pain, but it was not.  Kenney Houseman is asking about increasing pain medication, you have mentioned that in the past? Maybe a earlier appt? Scheduled to see you 7/15, Nicole Kindred ask.  Instructed to take tylenol  and take the Oxycodone per Rx bottle. [name] verbalized understanding.

## 2020-05-31 NOTE — Telephone Encounter (Signed)
Previously, when I assess her pain, she was not taking pain medication every 6 hours A lot of pain could also be related to constipation, which could be exacerbated by pain medication I suggest the family writing down each dose of pain medicine she takes at what time and document bowel movement I do not recommend increasing the dose yet

## 2020-05-31 NOTE — Telephone Encounter (Signed)
Called and given below message to daughter. She verbalized understanding. She ask that I call her Mom. No answer and voicemail not set up.

## 2020-05-31 NOTE — Telephone Encounter (Signed)
Called and given below message to Ms. Kleckley. She verbalized understanding.

## 2020-06-05 ENCOUNTER — Other Ambulatory Visit: Payer: Self-pay | Admitting: Medical Oncology

## 2020-06-05 ENCOUNTER — Other Ambulatory Visit: Payer: Self-pay | Admitting: Hematology and Oncology

## 2020-06-05 ENCOUNTER — Other Ambulatory Visit: Payer: Self-pay | Admitting: *Deleted

## 2020-06-05 DIAGNOSIS — C562 Malignant neoplasm of left ovary: Secondary | ICD-10-CM

## 2020-06-05 DIAGNOSIS — Z95828 Presence of other vascular implants and grafts: Secondary | ICD-10-CM

## 2020-06-06 ENCOUNTER — Ambulatory Visit: Payer: Medicare (Managed Care) | Admitting: Hematology and Oncology

## 2020-06-06 ENCOUNTER — Other Ambulatory Visit: Payer: Self-pay

## 2020-06-06 ENCOUNTER — Inpatient Hospital Stay: Payer: Medicare (Managed Care)

## 2020-06-06 ENCOUNTER — Inpatient Hospital Stay: Payer: Medicare (Managed Care) | Attending: Hematology and Oncology

## 2020-06-06 VITALS — BP 144/95 | HR 78 | Temp 98.4°F | Resp 17 | Wt 198.5 lb

## 2020-06-06 DIAGNOSIS — C562 Malignant neoplasm of left ovary: Secondary | ICD-10-CM

## 2020-06-06 DIAGNOSIS — Z7901 Long term (current) use of anticoagulants: Secondary | ICD-10-CM | POA: Diagnosis not present

## 2020-06-06 DIAGNOSIS — I69321 Dysphasia following cerebral infarction: Secondary | ICD-10-CM | POA: Diagnosis not present

## 2020-06-06 DIAGNOSIS — D630 Anemia in neoplastic disease: Secondary | ICD-10-CM | POA: Diagnosis not present

## 2020-06-06 DIAGNOSIS — C563 Malignant neoplasm of bilateral ovaries: Secondary | ICD-10-CM

## 2020-06-06 DIAGNOSIS — C561 Malignant neoplasm of right ovary: Secondary | ICD-10-CM | POA: Diagnosis not present

## 2020-06-06 DIAGNOSIS — C786 Secondary malignant neoplasm of retroperitoneum and peritoneum: Secondary | ICD-10-CM | POA: Insufficient documentation

## 2020-06-06 DIAGNOSIS — R109 Unspecified abdominal pain: Secondary | ICD-10-CM | POA: Insufficient documentation

## 2020-06-06 DIAGNOSIS — Z5111 Encounter for antineoplastic chemotherapy: Secondary | ICD-10-CM | POA: Diagnosis not present

## 2020-06-06 DIAGNOSIS — R918 Other nonspecific abnormal finding of lung field: Secondary | ICD-10-CM | POA: Diagnosis not present

## 2020-06-06 DIAGNOSIS — Z7189 Other specified counseling: Secondary | ICD-10-CM

## 2020-06-06 DIAGNOSIS — Z79899 Other long term (current) drug therapy: Secondary | ICD-10-CM | POA: Diagnosis not present

## 2020-06-06 DIAGNOSIS — Z95828 Presence of other vascular implants and grafts: Secondary | ICD-10-CM

## 2020-06-06 DIAGNOSIS — C800 Disseminated malignant neoplasm, unspecified: Secondary | ICD-10-CM

## 2020-06-06 LAB — CBC WITH DIFFERENTIAL (CANCER CENTER ONLY)
Abs Immature Granulocytes: 0.01 10*3/uL (ref 0.00–0.07)
Basophils Absolute: 0 10*3/uL (ref 0.0–0.1)
Basophils Relative: 0 %
Eosinophils Absolute: 0.1 10*3/uL (ref 0.0–0.5)
Eosinophils Relative: 1 %
HCT: 35.1 % — ABNORMAL LOW (ref 36.0–46.0)
Hemoglobin: 11.7 g/dL — ABNORMAL LOW (ref 12.0–15.0)
Immature Granulocytes: 0 %
Lymphocytes Relative: 33 %
Lymphs Abs: 1.7 10*3/uL (ref 0.7–4.0)
MCH: 35.2 pg — ABNORMAL HIGH (ref 26.0–34.0)
MCHC: 33.3 g/dL (ref 30.0–36.0)
MCV: 105.7 fL — ABNORMAL HIGH (ref 80.0–100.0)
Monocytes Absolute: 0.7 10*3/uL (ref 0.1–1.0)
Monocytes Relative: 14 %
Neutro Abs: 2.7 10*3/uL (ref 1.7–7.7)
Neutrophils Relative %: 52 %
Platelet Count: 210 10*3/uL (ref 150–400)
RBC: 3.32 MIL/uL — ABNORMAL LOW (ref 3.87–5.11)
RDW: 17.6 % — ABNORMAL HIGH (ref 11.5–15.5)
WBC Count: 5.2 10*3/uL (ref 4.0–10.5)
nRBC: 0 % (ref 0.0–0.2)

## 2020-06-06 LAB — CMP (CANCER CENTER ONLY)
ALT: 22 U/L (ref 0–44)
AST: 21 U/L (ref 15–41)
Albumin: 3.2 g/dL — ABNORMAL LOW (ref 3.5–5.0)
Alkaline Phosphatase: 122 U/L (ref 38–126)
Anion gap: 10 (ref 5–15)
BUN: 11 mg/dL (ref 8–23)
CO2: 25 mmol/L (ref 22–32)
Calcium: 9.4 mg/dL (ref 8.9–10.3)
Chloride: 105 mmol/L (ref 98–111)
Creatinine: 0.75 mg/dL (ref 0.44–1.00)
GFR, Est AFR Am: >60 mL/min (ref >60)
GFR, Estimated: >60 mL/min (ref >60)
Glucose, Bld: 89 mg/dL (ref 70–99)
Potassium: 4.2 mmol/L (ref 3.5–5.1)
Sodium: 140 mmol/L (ref 135–145)
Total Bilirubin: 0.5 mg/dL (ref 0.3–1.2)
Total Protein: 7.3 g/dL (ref 6.5–8.1)

## 2020-06-06 MED ORDER — PROCHLORPERAZINE MALEATE 10 MG PO TABS
ORAL_TABLET | ORAL | Status: AC
  Filled 2020-06-06: qty 1

## 2020-06-06 MED ORDER — SODIUM CHLORIDE 0.9 % IV SOLN
600.0000 mg/m2 | Freq: Once | INTRAVENOUS | Status: AC
  Administered 2020-06-06: 1292 mg via INTRAVENOUS
  Filled 2020-06-06: qty 33.98

## 2020-06-06 MED ORDER — SODIUM CHLORIDE 0.9 % IV SOLN
Freq: Once | INTRAVENOUS | Status: AC
  Filled 2020-06-06: qty 250

## 2020-06-06 MED ORDER — SODIUM CHLORIDE 0.9% FLUSH
10.0000 mL | INTRAVENOUS | Status: DC | PRN
  Administered 2020-06-06: 10 mL
  Filled 2020-06-06: qty 10

## 2020-06-06 MED ORDER — SODIUM CHLORIDE 0.9% FLUSH
10.0000 mL | Freq: Once | INTRAVENOUS | Status: AC
  Administered 2020-06-06: 10 mL
  Filled 2020-06-06: qty 10

## 2020-06-06 MED ORDER — PROCHLORPERAZINE MALEATE 10 MG PO TABS
10.0000 mg | ORAL_TABLET | Freq: Once | ORAL | Status: AC
  Administered 2020-06-06: 10 mg via ORAL

## 2020-06-06 MED ORDER — HEPARIN SOD (PORK) LOCK FLUSH 100 UNIT/ML IV SOLN
500.0000 [IU] | Freq: Once | INTRAVENOUS | Status: AC | PRN
  Administered 2020-06-06: 500 [IU]
  Filled 2020-06-06: qty 5

## 2020-06-06 NOTE — Patient Instructions (Signed)
[facility] °Discharge Instructions for Patients Receiving Chemotherapy ° °Today you received the following chemotherapy agents Gemzar ° °To help prevent nausea and vomiting after your treatment, we encourage you to take your nausea medication as directed. °  °If you develop nausea and vomiting that is not controlled by your nausea medication, call the clinic.  ° °BELOW ARE SYMPTOMS THAT SHOULD BE REPORTED IMMEDIATELY: °· *FEVER GREATER THAN 100.5 F °· *CHILLS WITH OR WITHOUT FEVER °· NAUSEA AND VOMITING THAT IS NOT CONTROLLED WITH YOUR NAUSEA MEDICATION °· *UNUSUAL SHORTNESS OF BREATH °· *UNUSUAL BRUISING OR BLEEDING °· TENDERNESS IN MOUTH AND THROAT WITH OR WITHOUT PRESENCE OF ULCERS °· *URINARY PROBLEMS °· *BOWEL PROBLEMS °· UNUSUAL RASH °Items with * indicate a potential emergency and should be followed up as soon as possible. ° °Feel free to call the clinic should you have any questions or concerns. The clinic phone number is [phone]. ° °Please show the CHEMO ALERT CARD at check-in to the Emergency Department and triage nurse. ° ° °

## 2020-06-20 ENCOUNTER — Inpatient Hospital Stay (HOSPITAL_BASED_OUTPATIENT_CLINIC_OR_DEPARTMENT_OTHER): Payer: Medicare (Managed Care) | Admitting: Hematology and Oncology

## 2020-06-20 ENCOUNTER — Inpatient Hospital Stay: Payer: Medicare (Managed Care)

## 2020-06-20 ENCOUNTER — Encounter: Payer: Self-pay | Admitting: Hematology and Oncology

## 2020-06-20 ENCOUNTER — Other Ambulatory Visit: Payer: Self-pay

## 2020-06-20 VITALS — BP 119/67 | HR 69 | Temp 98.4°F | Resp 18 | Ht 70.0 in | Wt 197.6 lb

## 2020-06-20 DIAGNOSIS — C562 Malignant neoplasm of left ovary: Secondary | ICD-10-CM

## 2020-06-20 DIAGNOSIS — C563 Malignant neoplasm of bilateral ovaries: Secondary | ICD-10-CM

## 2020-06-20 DIAGNOSIS — Z7189 Other specified counseling: Secondary | ICD-10-CM

## 2020-06-20 DIAGNOSIS — R1084 Generalized abdominal pain: Secondary | ICD-10-CM

## 2020-06-20 DIAGNOSIS — Z95828 Presence of other vascular implants and grafts: Secondary | ICD-10-CM

## 2020-06-20 DIAGNOSIS — D638 Anemia in other chronic diseases classified elsewhere: Secondary | ICD-10-CM

## 2020-06-20 DIAGNOSIS — R4702 Dysphasia: Secondary | ICD-10-CM | POA: Diagnosis not present

## 2020-06-20 DIAGNOSIS — C800 Disseminated malignant neoplasm, unspecified: Secondary | ICD-10-CM

## 2020-06-20 DIAGNOSIS — Z5111 Encounter for antineoplastic chemotherapy: Secondary | ICD-10-CM | POA: Diagnosis not present

## 2020-06-20 LAB — CBC WITH DIFFERENTIAL/PLATELET
Abs Immature Granulocytes: 0.01 10*3/uL (ref 0.00–0.07)
Basophils Absolute: 0 10*3/uL (ref 0.0–0.1)
Basophils Relative: 1 %
Eosinophils Absolute: 0.1 10*3/uL (ref 0.0–0.5)
Eosinophils Relative: 3 %
HCT: 35.7 % — ABNORMAL LOW (ref 36.0–46.0)
Hemoglobin: 11.8 g/dL — ABNORMAL LOW (ref 12.0–15.0)
Immature Granulocytes: 0 %
Lymphocytes Relative: 39 %
Lymphs Abs: 1.4 10*3/uL (ref 0.7–4.0)
MCH: 35 pg — ABNORMAL HIGH (ref 26.0–34.0)
MCHC: 33.1 g/dL (ref 30.0–36.0)
MCV: 105.9 fL — ABNORMAL HIGH (ref 80.0–100.0)
Monocytes Absolute: 0.5 10*3/uL (ref 0.1–1.0)
Monocytes Relative: 13 %
Neutro Abs: 1.6 10*3/uL — ABNORMAL LOW (ref 1.7–7.7)
Neutrophils Relative %: 44 %
Platelets: 179 10*3/uL (ref 150–400)
RBC: 3.37 MIL/uL — ABNORMAL LOW (ref 3.87–5.11)
RDW: 18.3 % — ABNORMAL HIGH (ref 11.5–15.5)
WBC: 3.7 10*3/uL — ABNORMAL LOW (ref 4.0–10.5)
nRBC: 0 % (ref 0.0–0.2)

## 2020-06-20 LAB — COMPREHENSIVE METABOLIC PANEL
ALT: 22 U/L (ref 0–44)
AST: 20 U/L (ref 15–41)
Albumin: 3.2 g/dL — ABNORMAL LOW (ref 3.5–5.0)
Alkaline Phosphatase: 123 U/L (ref 38–126)
Anion gap: 8 (ref 5–15)
BUN: 9 mg/dL (ref 8–23)
CO2: 27 mmol/L (ref 22–32)
Calcium: 9.5 mg/dL (ref 8.9–10.3)
Chloride: 107 mmol/L (ref 98–111)
Creatinine, Ser: 0.75 mg/dL (ref 0.44–1.00)
GFR calc Af Amer: >60 mL/min (ref >60)
GFR calc non Af Amer: >60 mL/min (ref >60)
Glucose, Bld: 112 mg/dL — ABNORMAL HIGH (ref 70–99)
Potassium: 4.2 mmol/L (ref 3.5–5.1)
Sodium: 142 mmol/L (ref 135–145)
Total Bilirubin: 0.7 mg/dL (ref 0.3–1.2)
Total Protein: 7.2 g/dL (ref 6.5–8.1)

## 2020-06-20 MED ORDER — SODIUM CHLORIDE 0.9 % IV SOLN
Freq: Once | INTRAVENOUS | Status: AC
  Filled 2020-06-20: qty 250

## 2020-06-20 MED ORDER — SODIUM CHLORIDE 0.9 % IV SOLN
600.0000 mg/m2 | Freq: Once | INTRAVENOUS | Status: AC
  Administered 2020-06-20: 1292 mg via INTRAVENOUS
  Filled 2020-06-20: qty 33.98

## 2020-06-20 MED ORDER — OXYCODONE HCL 5 MG PO TABS: 5.0000 mg | ORAL_TABLET | Freq: Four times a day (QID) | ORAL | 0 refills | Status: DC | PRN

## 2020-06-20 MED ORDER — PROCHLORPERAZINE MALEATE 10 MG PO TABS
ORAL_TABLET | ORAL | Status: AC
  Filled 2020-06-20: qty 1

## 2020-06-20 MED ORDER — PROCHLORPERAZINE MALEATE 10 MG PO TABS
10.0000 mg | ORAL_TABLET | Freq: Once | ORAL | Status: AC
  Administered 2020-06-20: 10 mg via ORAL

## 2020-06-20 MED ORDER — HEPARIN SOD (PORK) LOCK FLUSH 100 UNIT/ML IV SOLN
500.0000 [IU] | Freq: Once | INTRAVENOUS | Status: AC | PRN
  Administered 2020-06-20: 500 [IU]
  Filled 2020-06-20: qty 5

## 2020-06-20 MED ORDER — SODIUM CHLORIDE 0.9% FLUSH
10.0000 mL | Freq: Once | INTRAVENOUS | Status: AC
  Administered 2020-06-20: 10 mL
  Filled 2020-06-20: qty 10

## 2020-06-20 MED ORDER — SODIUM CHLORIDE 0.9% FLUSH
10.0000 mL | INTRAVENOUS | Status: DC | PRN
  Administered 2020-06-20: 10 mL
  Filled 2020-06-20: qty 10

## 2020-06-20 NOTE — Assessment & Plan Note (Signed)
She has multifocal pain the current prescribed pain medicine is adequate to control her pain She will continue oxycodone as prescribed

## 2020-06-20 NOTE — Patient Instructions (Signed)
[facility] °Discharge Instructions for Patients Receiving Chemotherapy ° °Today you received the following chemotherapy agents Gemzar ° °To help prevent nausea and vomiting after your treatment, we encourage you to take your nausea medication as directed. °  °If you develop nausea and vomiting that is not controlled by your nausea medication, call the clinic.  ° °BELOW ARE SYMPTOMS THAT SHOULD BE REPORTED IMMEDIATELY: °· *FEVER GREATER THAN 100.5 F °· *CHILLS WITH OR WITHOUT FEVER °· NAUSEA AND VOMITING THAT IS NOT CONTROLLED WITH YOUR NAUSEA MEDICATION °· *UNUSUAL SHORTNESS OF BREATH °· *UNUSUAL BRUISING OR BLEEDING °· TENDERNESS IN MOUTH AND THROAT WITH OR WITHOUT PRESENCE OF ULCERS °· *URINARY PROBLEMS °· *BOWEL PROBLEMS °· UNUSUAL RASH °Items with * indicate a potential emergency and should be followed up as soon as possible. ° °Feel free to call the clinic should you have any questions or concerns. The clinic phone number is [phone]. ° °Please show the CHEMO ALERT CARD at check-in to the Emergency Department and triage nurse. ° ° °

## 2020-06-20 NOTE — Assessment & Plan Note (Signed)
The patient has indicated she wants to take a treatment break I recommend she completes her treatment today and in 2 weeks I plan to order CT imaging early next month for further follow-up I discussed this over the phone with her daughter as well and they are in agreement to proceed

## 2020-06-20 NOTE — Assessment & Plan Note (Signed)
She tolerated treatment every other week well She is mildly anemic but not symptomatic She have no signs of bleeding Observe for now

## 2020-06-20 NOTE — Progress Notes (Signed)
[place] OFFICE PROGRESS NOTE  Patient Care Team: Helane Rima, MD as PCP - General (Family Medicine) Encarnacion Slates, MD as Referring Physician (Neurology) Cameron Sprang, MD as Consulting Physician (Neurology)  ASSESSMENT & PLAN:  Ovarian cancer [facility]) The patient has indicated she wants to take a treatment break I recommend she completes her treatment today and in 2 weeks I plan to order CT imaging early next month for further follow-up I discussed this over the phone with her daughter as well and they are in agreement to proceed  Anemia, chronic disease She tolerated treatment every other week well She is mildly anemic but not symptomatic She have no signs of bleeding Observe for now  Abdominal pain She has multifocal pain the current prescribed pain medicine is adequate to control her pain She will continue oxycodone as prescribed  Expressive dysphasia She has expressive dysphagia from prior history of stroke I recommend her daughter to come with her for doctor's visit appointment in the future to facilitate accurate communication   Orders Placed This Encounter  Procedures  . CT ABDOMEN PELVIS W CONTRAST    Standing Status:   Future    Standing Expiration Date:   06/20/2021    Order Specific Question:   If indicated for the ordered procedure, I authorize the administration of contrast media per Radiology protocol    Answer:   Yes    Order Specific Question:   Preferred imaging location?    Answer:   [facility]    Order Specific Question:   Radiology Contrast Protocol - do NOT remove file path    Answer:   [email or web address]  . CA 125    Standing Status:   Standing    Number of Occurrences:   11    Standing Expiration Date:   06/20/2021    All questions were answered. The patient knows to call the clinic with any problems, questions or concerns. The total time spent in the appointment was 20 minutes encounter with  patients including review of chart and various tests results, discussions about plan of care and coordination of care plan   Heath Lark, MD 06/20/2020 11:58 AM  INTERVAL HISTORY: Please see below for problem oriented charting. She returns for chemotherapy and follow-up Her daughter is not available but I subsequently talked to her over the phone The patient indicated she wants to start chemotherapy in the near future because she is tired of it She has intermittent mild abdominal pain that comes and goes and requested prescription pain medicine refill No recent nausea or changes in bowel habits  SUMMARY OF ONCOLOGIC HISTORY: Oncology History Overview Note  Negative genetic testing Progressed on Niraparib   Carcinomatosis ([place])  12/06/2015 Initial Diagnosis   Carcinomatosis ([place])   Ovarian cancer, bilateral ([place])  12/24/2015 Initial Diagnosis   Ovarian cancer, bilateral ([place])   Ovarian cancer ([place])  11/28/2015 Imaging   Ct scan of abdomen: Peritoneal carcinomatosis and pelvic/inguinal adenopathy. Gynecologic primary is favored.   12/17/2015 Pathology Results   Lymph node, needle/core biopsy, L inguinal LAN - METASTATIC ADENOCARCINOMA. Microscopic Comment Immunohistochemistry will be performed and reported as an addendum. (JDP:ecj 12/18/2015) ADDENDUM: Immunohistochemistry shows strong positivity with cytokeratin 7, estrogen receptor (ER), progesterone receptor (PR), p53 and WT1. Negative markers are cytokeratin 20 , CDX- 2 and gross cystic disease fluid protein. 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 (JDP:kh 12-19-15   12/17/2015 Procedure  Technically successful ultrasound guided core left inguinal adenopathy biopsy   12/24/2015 Tumor Marker   Patient's tumor was tested for the following markers: CA-125 Results of the tumor marker test revealed 608.2   12/27/2015 - 02/28/2016  Chemotherapy   She received neoadjuvant chemo x 3 cycles   01/01/2016 Tumor Marker   Patient's tumor was tested for the following markers: CA-125 Results of the tumor marker test revealed 741.6   01/29/2016 Procedure   Successful placement of a right internal jugular approach power injectable Port-A-Cath. The catheter is ready for immediate use   01/29/2016 Imaging   US abdomen 1. Hyperechoic 2.5 x 1.0 x 1.6 cm lesion in the region the caudate lobe of the liver. Similar finding noted on prior CT of 11/28/2015. This could represent hemangioma. This could represent a malignancy including metastatic disease. 2. Exam otherwise unremarkable. No gallstones. No biliary distention.   02/03/2016 Tumor Marker   Patient's tumor was tested for the following markers: CA-125 Results of the tumor marker test revealed 137.1   02/20/2016 Imaging   MRI brain No acute infarct.  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  No intracranial mass.  Moderate small vessel disease changes.  Global atrophy without hydrocephalus.  Expanded partially empty sella without secondary findings of pseudotumor cerebri.  Minimal mucosal thickening ethmoid sinus air cells. Small air-fluid levels maxillary sinuses bilaterally may indicate changes of acute sinusitis.    02/28/2016 Tumor Marker   Patient's tumor was tested for the following markers: CA-125 Results of the tumor marker test revealed 33.6   03/02/2016 Imaging   CT chest, abdomen and pelvis 1. Today's study demonstrates a positive response to therapy with resolution of the previously noted malignant ascites, significant regression of previously noted peritoneal implants, and regression of previously noted lymphadenopathy in the abdomen and pelvis. 2. No definite evidence to suggest metastatic disease to the thorax. 3. Stable 1.0 x 1.7 cm intermediate attenuation lesion associated with the posterior aspect of segment 1 of the liver,  favored to represent a mildly proteinaceous hepatic cyst. The possibility of a peritoneal implant in this region is not entirely excluded, but is not favored on today's examination. 4. Extensive post infectious scarring inguinal upper right lung, likely sequela of prior necrotizing pneumonia. 5. Multiple tiny pulmonary nodules scattered throughout the lungs bilaterally all measuring 4 mm or less. These are nonspecific, but favored to be benign. Given the patient's history of primary gynecologic malignancy, attention on followup studies is recommended to ensure the stability of these findings. 6. Additional incidental findings, as above   03/14/2016 Imaging   CT angiogram 1. No pulmonary emboli. 2. Chronic changes to the right upper lobe. 3. Stable lesion in the caudate lobe of the liver. 4. Stable soft tissue prominence to the right of the trachea as described above. 5. Stable small nodule in the right lung.    03/24/2016 Pathology Results   1. Omentum, resection for tumor - FOCAL ADENOCARCINOMA ASSOCIATED WITH EXTENSIVE FIBROSIS, INFLAMMATION AND HEMOSIDERIN DEPOSITION. 2. Uterus +/- tubes/ovaries, neoplastic, cervix - CERVIX: SLIGHT CERVICITIS AND SQUAMOUS METAPLASIA. - ENDOMETRIUM: ATROPHIC, NO HYPERPLASIA OR MALIGNANCY. - MYOMETRIUM: LEIOMYOMATA WITH DEGENERATIVE CHANGES. NO MALIGNANCY. - UTERINE SEROSA: FOCAL ADENOCARCINOMA ASSOCIATED WITH ADHESIONS. - RIGHT OVARY: BENIGN CALCIFIED NODULE AND BENIGN SEROUS CYST. NO MALIGNANCY IDENTIFIED. - RIGHT FALLOPIAN TUBE: UNREMARKABLE. NO MALIGNANCY IDENTIFIED. - LEFT OVARY: FOCAL ADENOCARCINOMA ASSOCIATED WITH EXTENSIVE FIBROSIS. - LEFT FALLOPIAN TUBE: HYDROSALPINX. NO MALIGNANCY IDENTIFIED. 3. Lymph node, biopsy, right external iliac - METASTATIC ADENOCARCINOMA, 4.  Soft tissue, biopsy, left para colic gutter - FOCAL ADENOCARCINOMA ASSOCIATED WITH FIBROSIS. Microscopic Comment 2. OVARY Specimen(s): Uterus with bilateral ovaries, omentum,  right external iliac lymph node and left paracolic gutter. Procedure: (including lymph node sampling) Hysterectomy with bilateral salpingo-oophorectomy, omentectomy, lymph node biopsy and paracolic gutter biopsy. Primary tumor site (including laterality): Left ovary. Ovarian surface involvement: Yes Ovarian capsule intact without fragmentation: N/A Maximum tumor size (cm): 2 cm, 2 cm Histologic type: Serous carcinoma. Grade: 3 Peritoneal implants: (specify invasive or non-invasive): Left paracolic gutter positive for carcinoma Pelvic extension (list additional structures on separate lines and if involved): Omentum, right paracolic gutter and uterine serosa. Lymph nodes: number examined 1 ; number positive 1 TNM code: ypT3a, ypN1b, ypMX FIGO Stage (based on pathologic findings, needs clinical correlation): IIIA2 Comments: The left ovary has a 2 cm nodule, which is composed primarily of fibrous tissue with small foci of residual adenocarcinoma. There is also microscopic involvement of the uterine serosa, the left paracolic gutter biopsy and the omentum. The left paracolic gutter and omental involvement is microscopic and both are associated with extensive fibrosis with focal hemosiderin deposition. The right external iliac node is completely replaced by metastatic adenocarcinoma with serous features. (JDP:ecj 03/30/2016)   03/24/2016 Surgery   Operation: Robotic-assisted laparoscopic total hysterectomy with bilateral salpingoophorectomy, omentectomy, radical tumor debulking  Surgeon: Donaciano Eva  Operative Findings:  : small nodules in omentum, no ascites. Omental nodule (2cm) adherent to lower anterior abdominal wall. 2cm left colonic gutter cystic nodule. 6cm right external iliac lymph node. Grossly normal ovaries and tubes. Fibroid uterus. No gross residual disease at completion of surgery representing R0 optimal/complete resection.    04/17/2016 Imaging   CT abdomen and  pelvis 1. There is no evidence of acute inflammatory process within abdomen or pelvis. 2. Stable low-density lesion within caudate lobe of the liver. No new hepatic lesions are noted. 3. Again noted lobulated renal contour and multifocal renal cortical scarring. No hydronephrosis or hydroureter. 4. No significant mesenteric adenopathy. No retroperitoneal adenopathy. Stable minimal residual peritoneal thickening within pelvis. No new pelvic implants or pelvic ascites. 5. Status post hysterectomy.  Unremarkable urinary bladder. 6. Moderate stool within colon as described above. No evidence of colitis.    04/24/2016 - 06/19/2016 Chemotherapy   She received 3 more cycles of chemo after surgery   05/14/2016 Tumor Marker   Patient's tumor was tested for the following markers: CA-125 Results of the tumor marker test revealed 22.5   06/04/2016 Tumor Marker   Patient's tumor was tested for the following markers: CA-125 Results of the tumor marker test revealed 13.7   07/07/2016 Genetic Testing   Patient has genetic testing done for breast/ovasrian cancer panel Results revealed patient has no actionable mutation   07/23/2016 Imaging   Ct abdomen and pelvis 1. Heterogeneously enhancing 4.9 x 3.2 x 4.4 cm mass along the right pelvic sidewall may represent locally recurrent disease or malignant lymphadenopathy. 2. No other signs of definite metastatic disease noted elsewhere in the abdomen or pelvis. 3. Stable low-attenuation hepatic lesion in the caudate lobe of the liver, which appears to demonstrates some progressive centripetal filling on delayed images. This lesion is presumably benign given its stability compared to prior studies, and is favored to represent a small cavernous hemangioma. 4. Cardiomegaly with biatrial dilatation, which is very severe on the right side. 5. Aortic atherosclerosis. 6. Additional incidental findings, as above.   07/23/2016 Tumor Marker   Patient's tumor was tested for  the following  markers: CA-125 Results of the tumor marker test revealed 12.3   09/30/2016 Imaging   Ct abdomen and pelvis: 1. 4.9 cm heterogeneously enhancing mass seen along the right pelvic sidewall previously has almost completely resolved. There is some ill-defined soft tissue attenuation/peritoneal thickening in this region today but no discrete measurable lesion is evident. 2. No new or progressive findings on today's exam. 3. Stable hypo attenuating lesion in the caudate lobe of the liver. 4. Subtle aortic atherosclerosis   09/30/2016 Tumor Marker   Patient's tumor was tested for the following markers: CA-125 Results of the tumor marker test revealed 9.6   10/05/2016 Pathology Results   Vagina, biopsy, left cuff - BENIGN FIBROEPITHELIAL (STROMAL) POLYP. - NO DYSPLASIA, ATYPIA OR MALIGNANCY IDENTIFIED.   01/14/2017 Tumor Marker   Patient's tumor was tested for the following markers: CA-125 Results of the tumor marker test revealed 11.9   05/05/2017 Tumor Marker   Patient's tumor was tested for the following markers: CA-125 Results of the tumor marker test revealed 28   06/14/2017 Tumor Marker   Patient's tumor was tested for the following markers: CA-125 Results of the tumor marker test revealed 45.7   06/24/2017 Imaging   Ct abdomen and pelvis: Increased peritoneal metastatic disease in abdomen and pelvis since prior exam. No evidence of ascites. Stable small benign hepatic hemangioma.   07/21/2017 Tumor Marker   Patient's tumor was tested for the following markers: CA-125 Results of the tumor marker test revealed 84.1   07/21/2017 - 11/03/2017 Chemotherapy   She received carboplatin and taxol   08/11/2017 Adverse Reaction   She had severe neuropathy due to treatment. Does of chemo is reduced starting cycle 2 onwards   09/02/2017 Tumor Marker   Patient's tumor was tested for the following markers: CA-125 Results of the tumor marker test revealed 27.7   09/16/2017 Imaging    CT CHEST IMPRESSION  1. No acute process or evidence of metastatic disease in the chest. 2. Right upper lung bronchiectasis, consolidation, and architectural distortion are most likely post infectious or inflammatory and not significantly changed. 3. Aortic Atherosclerosis (ICD10-I70.0).  CT ABDOMEN AND PELVIS IMPRESSION  1. Response to therapy of nodal and peritoneal metastasis. 2. No new or progressive disease. 3. Caudate lobe hemangioma, as before. 4. Bilateral renal cortical thinning. Similar minimal right-sided caliectasis and hydroureter. Likely secondary to low-grade obstruction by the dominant right pelvic implant.   09/22/2017 Tumor Marker   Patient's tumor was tested for the following markers: CA-125 Results of the tumor marker test revealed 18   11/03/2017 Tumor Marker   Patient's tumor was tested for the following markers: CA-125 Results of the tumor marker test revealed 13.4   11/25/2017 Imaging   1. Stable to slight interval decrease in size of nodal and peritoneal metastatic disease within the abdomen. 2. No new or progressive disease.   12/03/2017 - 09/11/2019 Chemotherapy   The patient is prescribed [place]   01/17/2018 Tumor Marker   Patient's tumor was tested for the following markers: CA-125 Results of the tumor marker test revealed 11.9   02/22/2018 Tumor Marker   Patient's tumor was tested for the following markers: CA-125 Results of the tumor marker test revealed 11.7   02/22/2018 Imaging   1. Improved nodular thickening along the vaginal cuff on the left and medial to the right iliac vessels, likely treated implants. No evidence of progressive peritoneal disease. 2. Stable hepatic hemangioma. No evidence of solid visceral organ metastases. 3. Bilateral renal cortical scarring.  05/19/2018 Tumor Marker   Patient's tumor was tested for the following markers: CA-125 Results of the tumor marker test revealed 9.9   08/10/2018 Imaging   Stable mild  nodular thickening of the left vaginal cuff. No new or progressive disease within the abdomen or pelvis.  Stable small hepatic hemangioma.   09/21/2018 Tumor Marker   Patient's tumor was tested for the following markers: CA-125 Results of the tumor marker test revealed 10.3   11/11/2018 Tumor Marker   Patient's tumor was tested for the following markers: CA-125 Results of the tumor marker test revealed 10.5   12/15/2018 Tumor Marker   Patient's tumor was tested for the following markers: CA-125 Results of the tumor marker test revealed 12.6   01/06/2019 Tumor Marker   Patient's tumor was tested for the following markers: CA-125 Results of the tumor marker test revealed 14.6   01/12/2019 Imaging   1. Chronic extensive right upper lobe scarring changes and bronchiectasis. 2. No mediastinal or hilar mass or adenopathy and no findings for pulmonary metastatic disease. 3. Stable caudate lobe hemangioma. 4. No worrisome omental or peritoneal surface lesions. 5. Stable pericecal and small pelvic lymph nodes. No new or progressive findings.    09/11/2019 Imaging   Ct abdomen and pelvis Pericecal peritoneal metastatic implants   09/19/2019 -  Chemotherapy   The patient had carboplatin and gemxar for chemotherapy treatment.     11/20/2019 Imaging   1. Mild decrease in size of peritoneal tumor implant along the lateral aspect of the cecum. 2. Stable tiny sub-cm mesenteric soft tissue nodules or lymph nodes in right lower quadrant and anterior pelvic mesentery. 3. No new or progressive disease identified within the abdomen or pelvis.   03/12/2020 Imaging   1. No acute findings within the abdomen or pelvis. 2. Stable appearance of right lower quadrant peritoneal implant adjacent to cecum. 3. Increase in size of right lower quadrant ileocolic lymph node. 1.2 cm on today's study versus 0.8 previously. 4. Possible early avascular necrosis involving the left femoral head.   Aortic  Atherosclerosis (ICD10-I70.0).     REVIEW OF SYSTEMS:   Constitutional: Denies fevers, chills or abnormal weight loss Eyes: Denies blurriness of vision Ears, nose, mouth, throat, and face: Denies mucositis or sore throat Respiratory: Denies cough, dyspnea or wheezes Cardiovascular: Denies palpitation, chest discomfort or lower extremity swelling Gastrointestinal:  Denies nausea, heartburn or change in bowel habits Skin: Denies abnormal skin rashes Lymphatics: Denies new lymphadenopathy or easy bruising Neurological:Denies numbness, tingling or new weaknesses Behavioral/Psych: Mood is stable, no new changes  All other systems were reviewed with the patient and are negative.  I have reviewed the past medical history, past surgical history, social history and family history with the patient and they are unchanged from previous note.  ALLERGIES:  is allergic to codeine, lactulose, and latex.  MEDICATIONS:  Current Outpatient Medications  Medication Sig Dispense Refill  . apixaban (ELIQUIS) 5 MG TABS tablet Take 1 tablet (5 mg total) by mouth 2 (two) times daily. 60 tablet 5  . atorvastatin (LIPITOR) 40 MG tablet Take 40 mg by mouth at bedtime.    . gabapentin (NEURONTIN) 600 MG tablet Take 1 tablet (600 mg total) by mouth 2 (two) times daily. 60 tablet 11  . lidocaine-prilocaine (EMLA) cream Apply to Porta-Cath 1-2 hours prior to access as directed. 30 g 1  . metoprolol succinate (TOPROL-XL) 25 MG 24 hr tablet Take 25 mg by mouth daily.   5  . Multiple Vitamin (MULTIVITAMIN  WITH MINERALS) TABS tablet Take 1 tablet by mouth daily.    Marland Kitchen omega-3 acid ethyl esters (LOVAZA) 1 g capsule Take 1 g by mouth daily.    . ondansetron (ZOFRAN ODT) 4 MG disintegrating tablet Take 1 tablet (4 mg total) by mouth every 8 (eight) hours as needed for nausea or vomiting. 60 tablet 9  . oxyCODONE (OXY IR/ROXICODONE) 5 MG immediate release tablet Take 1 tablet (5 mg total) by mouth every 6 (six) hours as  needed for severe pain. 60 tablet 0  . PARoxetine (PAXIL) 40 MG tablet Take 40 mg by mouth daily.  0  . polyethylene glycol (MIRALAX / GLYCOLAX) packet Take 17 g by mouth daily.     No current facility-administered medications for this visit.   Facility-Administered Medications Ordered in Other Visits  Medication Dose Route Frequency Provider Last Rate Last Admin  . gemcitabine (GEMZAR) 1,292 mg in sodium chloride 0.9 % 250 mL chemo infusion  600 mg/m2 (Treatment Plan Recorded) Intravenous Once Bertis Ruddy, Tirzah Fross, MD      . heparin lock flush 100 unit/mL  500 Units Intracatheter Once PRN Bertis Ruddy, Salam Micucci, MD      . sodium chloride flush (NS) 0.9 % injection 10 mL  10 mL Intracatheter PRN Bertis Ruddy, Arlyn Bumpus, MD        PHYSICAL EXAMINATION: ECOG PERFORMANCE STATUS: 2 - Symptomatic, <50% confined to bed  Vitals:   06/20/20 1040  BP: 119/67  Pulse: 69  Resp: 18  Temp: 98.4 F (36.9 C)  SpO2: 100%   Filed Weights   06/20/20 1040  Weight: 197 lb 9.6 oz (89.6 kg)    GENERAL:alert, no distress and comfortable SKIN: skin color, texture, turgor are normal, no rashes or significant lesions EYES: normal, Conjunctiva are pink and non-injected, sclera clear OROPHARYNX:no exudate, no erythema and lips, buccal mucosa, and tongue normal  NECK: supple, thyroid normal size, non-tender, without nodularity LYMPH:  no palpable lymphadenopathy in the cervical, axillary or inguinal LUNGS: clear to auscultation and percussion with normal breathing effort HEART: regular rate & rhythm and no murmurs and no lower extremity edema ABDOMEN:abdomen soft, non-tender and normal bowel sounds Musculoskeletal:no cyanosis of digits and no clubbing  NEURO: alert & oriented x 3 with dysarthria,   LABORATORY DATA:  I have reviewed the data as listed    Component Value Date/Time   NA 142 06/20/2020 1026   NA 142 12/08/2017 1051   K 4.2 06/20/2020 1026   K 4.4 12/08/2017 1051   CL 107 06/20/2020 1026   CL 105 06/14/2017 1430    CO2 27 06/20/2020 1026   CO2 28 12/08/2017 1051   GLUCOSE 112 (H) 06/20/2020 1026   GLUCOSE 97 12/08/2017 1051   BUN 9 06/20/2020 1026   BUN 16.2 12/08/2017 1051   CREATININE 0.75 06/20/2020 1026   CREATININE 0.75 06/06/2020 1049   CREATININE 0.9 12/08/2017 1051   CALCIUM 9.5 06/20/2020 1026   CALCIUM 9.5 12/08/2017 1051   PROT 7.2 06/20/2020 1026   PROT 6.9 12/08/2017 1051   ALBUMIN 3.2 (L) 06/20/2020 1026   ALBUMIN 3.6 12/08/2017 1051   AST 20 06/20/2020 1026   AST 21 06/06/2020 1049   AST 18 12/08/2017 1051   ALT 22 06/20/2020 1026   ALT 22 06/06/2020 1049   ALT 20 12/08/2017 1051   ALKPHOS 123 06/20/2020 1026   ALKPHOS 131 12/08/2017 1051   BILITOT 0.7 06/20/2020 1026   BILITOT 0.5 06/06/2020 1049   BILITOT 0.70 12/08/2017 1051   GFRNONAA >60  06/20/2020 1026   GFRNONAA >60 06/06/2020 1049   GFRAA >60 06/20/2020 1026   GFRAA >60 06/06/2020 1049    No results found for: SPEP, UPEP  Lab Results  Component Value Date   WBC 3.7 (L) 06/20/2020   NEUTROABS 1.6 (L) 06/20/2020   HGB 11.8 (L) 06/20/2020   HCT 35.7 (L) 06/20/2020   MCV 105.9 (H) 06/20/2020   PLT 179 06/20/2020      Chemistry      Component Value Date/Time   NA 142 06/20/2020 1026   NA 142 12/08/2017 1051   K 4.2 06/20/2020 1026   K 4.4 12/08/2017 1051   CL 107 06/20/2020 1026   CL 105 06/14/2017 1430   CO2 27 06/20/2020 1026   CO2 28 12/08/2017 1051   BUN 9 06/20/2020 1026   BUN 16.2 12/08/2017 1051   CREATININE 0.75 06/20/2020 1026   CREATININE 0.75 06/06/2020 1049   CREATININE 0.9 12/08/2017 1051      Component Value Date/Time   CALCIUM 9.5 06/20/2020 1026   CALCIUM 9.5 12/08/2017 1051   ALKPHOS 123 06/20/2020 1026   ALKPHOS 131 12/08/2017 1051   AST 20 06/20/2020 1026   AST 21 06/06/2020 1049   AST 18 12/08/2017 1051   ALT 22 06/20/2020 1026   ALT 22 06/06/2020 1049   ALT 20 12/08/2017 1051   BILITOT 0.7 06/20/2020 1026   BILITOT 0.5 06/06/2020 1049   BILITOT 0.70 12/08/2017  1051

## 2020-06-20 NOTE — Assessment & Plan Note (Signed)
She has expressive dysphagia from prior history of stroke I recommend her daughter to come with her for doctor's visit appointment in the future to facilitate accurate communication 

## 2020-06-29 ENCOUNTER — Other Ambulatory Visit: Payer: Self-pay | Admitting: Hematology and Oncology

## 2020-07-04 ENCOUNTER — Inpatient Hospital Stay: Payer: Medicare (Managed Care)

## 2020-07-04 ENCOUNTER — Other Ambulatory Visit: Payer: Self-pay

## 2020-07-04 ENCOUNTER — Inpatient Hospital Stay: Payer: Medicare (Managed Care) | Admitting: Nutrition

## 2020-07-04 VITALS — BP 138/78 | HR 76 | Temp 98.6°F | Resp 18 | Wt 198.5 lb

## 2020-07-04 DIAGNOSIS — Z95828 Presence of other vascular implants and grafts: Secondary | ICD-10-CM

## 2020-07-04 DIAGNOSIS — Z5111 Encounter for antineoplastic chemotherapy: Secondary | ICD-10-CM | POA: Diagnosis not present

## 2020-07-04 DIAGNOSIS — C563 Malignant neoplasm of bilateral ovaries: Secondary | ICD-10-CM

## 2020-07-04 DIAGNOSIS — C562 Malignant neoplasm of left ovary: Secondary | ICD-10-CM

## 2020-07-04 DIAGNOSIS — C800 Disseminated malignant neoplasm, unspecified: Secondary | ICD-10-CM

## 2020-07-04 DIAGNOSIS — Z7189 Other specified counseling: Secondary | ICD-10-CM

## 2020-07-04 LAB — CBC WITH DIFFERENTIAL/PLATELET
Abs Immature Granulocytes: 0.01 10*3/uL (ref 0.00–0.07)
Basophils Absolute: 0 10*3/uL (ref 0.0–0.1)
Basophils Relative: 0 %
Eosinophils Absolute: 0.1 10*3/uL (ref 0.0–0.5)
Eosinophils Relative: 1 %
HCT: 35.6 % — ABNORMAL LOW (ref 36.0–46.0)
Hemoglobin: 12 g/dL (ref 12.0–15.0)
Immature Granulocytes: 0 %
Lymphocytes Relative: 24 %
Lymphs Abs: 1.5 10*3/uL (ref 0.7–4.0)
MCH: 35.8 pg — ABNORMAL HIGH (ref 26.0–34.0)
MCHC: 33.7 g/dL (ref 30.0–36.0)
MCV: 106.3 fL — ABNORMAL HIGH (ref 80.0–100.0)
Monocytes Absolute: 0.8 10*3/uL (ref 0.1–1.0)
Monocytes Relative: 13 %
Neutro Abs: 3.8 10*3/uL (ref 1.7–7.7)
Neutrophils Relative %: 62 %
Platelets: 179 10*3/uL (ref 150–400)
RBC: 3.35 MIL/uL — ABNORMAL LOW (ref 3.87–5.11)
RDW: 18.4 % — ABNORMAL HIGH (ref 11.5–15.5)
WBC: 6.2 10*3/uL (ref 4.0–10.5)
nRBC: 0 % (ref 0.0–0.2)

## 2020-07-04 LAB — COMPREHENSIVE METABOLIC PANEL
ALT: 26 U/L (ref 0–44)
AST: 26 U/L (ref 15–41)
Albumin: 3.3 g/dL — ABNORMAL LOW (ref 3.5–5.0)
Alkaline Phosphatase: 140 U/L — ABNORMAL HIGH (ref 38–126)
Anion gap: 8 (ref 5–15)
BUN: 9 mg/dL (ref 8–23)
CO2: 27 mmol/L (ref 22–32)
Calcium: 9.8 mg/dL (ref 8.9–10.3)
Chloride: 106 mmol/L (ref 98–111)
Creatinine, Ser: 0.74 mg/dL (ref 0.44–1.00)
GFR calc Af Amer: >60 mL/min (ref >60)
GFR calc non Af Amer: >60 mL/min (ref >60)
Glucose, Bld: 94 mg/dL (ref 70–99)
Potassium: 4 mmol/L (ref 3.5–5.1)
Sodium: 141 mmol/L (ref 135–145)
Total Bilirubin: 0.6 mg/dL (ref 0.3–1.2)
Total Protein: 7.2 g/dL (ref 6.5–8.1)

## 2020-07-04 MED ORDER — PROCHLORPERAZINE MALEATE 10 MG PO TABS
10.0000 mg | ORAL_TABLET | Freq: Once | ORAL | Status: AC
  Administered 2020-07-04: 10 mg via ORAL

## 2020-07-04 MED ORDER — SODIUM CHLORIDE 0.9% FLUSH
10.0000 mL | Freq: Once | INTRAVENOUS | Status: AC
  Administered 2020-07-04: 10 mL
  Filled 2020-07-04: qty 10

## 2020-07-04 MED ORDER — SODIUM CHLORIDE 0.9% FLUSH
10.0000 mL | INTRAVENOUS | Status: DC | PRN
  Administered 2020-07-04: 10 mL
  Filled 2020-07-04: qty 10

## 2020-07-04 MED ORDER — SODIUM CHLORIDE 0.9 % IV SOLN
600.0000 mg/m2 | Freq: Once | INTRAVENOUS | Status: AC
  Administered 2020-07-04: 1292 mg via INTRAVENOUS
  Filled 2020-07-04: qty 33.98

## 2020-07-04 MED ORDER — SODIUM CHLORIDE 0.9 % IV SOLN
Freq: Once | INTRAVENOUS | Status: AC
  Filled 2020-07-04: qty 250

## 2020-07-04 MED ORDER — HEPARIN SOD (PORK) LOCK FLUSH 100 UNIT/ML IV SOLN
500.0000 [IU] | Freq: Once | INTRAVENOUS | Status: AC | PRN
  Administered 2020-07-04: 500 [IU]
  Filled 2020-07-04: qty 5

## 2020-07-04 MED ORDER — PROCHLORPERAZINE MALEATE 10 MG PO TABS
ORAL_TABLET | ORAL | Status: AC
  Filled 2020-07-04: qty 1

## 2020-07-04 NOTE — Progress Notes (Signed)
Patient was identified to be at risk for malnutrition secondary to wt loss and poor appetite.  69 year old female diagnosed with Ovarian cancer and followed by Dr. Alvy Bimler.  PMH includes Stroke, HTN, HLD, Breast cancer, anxiety and Afib.  Medications include Lipitor, MVI, Zofran, and Miralax.  Labs were reviewed.  Height: 5" 10". Weight: 198.5 pounds on July 29. UBW: 209 pounds in May 2021. BMI: 28.48.  Patient's weight has stabilized over the past month. She has lost about 5% of body weight over the past 3 months which is not significant. She reports a good appetite. Reports nausea is controlled with medications. Denies Vomiting, Diarrhea, and Constipation. Generally only has a small breakfast but likes eggs and yogurt and is willing to add these high protein foods to breakfast.  Nutrition Diagnosis: Unintended weight loss related to ovarian cancer as evidenced by ~10 pound weight loss.  Intervention: Educated to increase high calorie, high protein foods in small frequent meals and snacks. Reviewed suggestions for meals and snacks. Encouraged wt maintenance. Provided fact sheets and contact information. Questions answered and teach back method used.  Monitoring, Evaluation, Goals: Patient will tolerate adequate calories and protein for weight maintenance.  Next Visit: Patient will contact RD for questions or concerns.

## 2020-07-04 NOTE — Patient Instructions (Signed)

## 2020-07-04 NOTE — Patient Instructions (Signed)
[facility] °Discharge Instructions for Patients Receiving Chemotherapy ° °Today you received the following chemotherapy agents Gemzar ° °To help prevent nausea and vomiting after your treatment, we encourage you to take your nausea medication as directed. °  °If you develop nausea and vomiting that is not controlled by your nausea medication, call the clinic.  ° °BELOW ARE SYMPTOMS THAT SHOULD BE REPORTED IMMEDIATELY: °· *FEVER GREATER THAN 100.5 F °· *CHILLS WITH OR WITHOUT FEVER °· NAUSEA AND VOMITING THAT IS NOT CONTROLLED WITH YOUR NAUSEA MEDICATION °· *UNUSUAL SHORTNESS OF BREATH °· *UNUSUAL BRUISING OR BLEEDING °· TENDERNESS IN MOUTH AND THROAT WITH OR WITHOUT PRESENCE OF ULCERS °· *URINARY PROBLEMS °· *BOWEL PROBLEMS °· UNUSUAL RASH °Items with * indicate a potential emergency and should be followed up as soon as possible. ° °Feel free to call the clinic should you have any questions or concerns. The clinic phone number is [phone]. ° °Please show the CHEMO ALERT CARD at check-in to the Emergency Department and triage nurse. ° ° °

## 2020-07-05 LAB — CA 125: Cancer Antigen (CA) 125: 73 U/mL — ABNORMAL HIGH (ref 0.0–38.1)

## 2020-07-09 ENCOUNTER — Other Ambulatory Visit: Payer: Self-pay

## 2020-07-09 ENCOUNTER — Encounter (HOSPITAL_COMMUNITY): Payer: Self-pay

## 2020-07-09 ENCOUNTER — Ambulatory Visit (HOSPITAL_COMMUNITY)
Admission: RE | Admit: 2020-07-09 | Discharge: 2020-07-09 | Disposition: A | Discharge status: Home / Self Care | Payer: Medicare (Managed Care) | Source: Ambulatory Visit | Attending: Hematology and Oncology | Admitting: Hematology and Oncology

## 2020-07-09 DIAGNOSIS — C562 Malignant neoplasm of left ovary: Secondary | ICD-10-CM | POA: Insufficient documentation

## 2020-07-09 MED ORDER — SODIUM CHLORIDE (PF) 0.9 % IJ SOLN
INTRAMUSCULAR | Status: AC
  Filled 2020-07-09: qty 50

## 2020-07-09 MED ORDER — IOHEXOL 300 MG/ML  SOLN
100.0000 mL | Freq: Once | INTRAMUSCULAR | Status: AC | PRN
  Administered 2020-07-09: 100 mL via INTRAVENOUS

## 2020-07-10 ENCOUNTER — Encounter: Payer: Self-pay | Admitting: Hematology and Oncology

## 2020-07-10 ENCOUNTER — Other Ambulatory Visit: Payer: Self-pay

## 2020-07-10 ENCOUNTER — Inpatient Hospital Stay: Payer: Medicare (Managed Care) | Attending: Hematology and Oncology | Admitting: Hematology and Oncology

## 2020-07-10 VITALS — BP 129/76 | HR 75 | Temp 98.9°F | Resp 18 | Ht 70.0 in | Wt 199.2 lb

## 2020-07-10 DIAGNOSIS — Z7189 Other specified counseling: Secondary | ICD-10-CM | POA: Diagnosis not present

## 2020-07-10 DIAGNOSIS — Z7901 Long term (current) use of anticoagulants: Secondary | ICD-10-CM | POA: Diagnosis not present

## 2020-07-10 DIAGNOSIS — Z79899 Other long term (current) drug therapy: Secondary | ICD-10-CM | POA: Diagnosis not present

## 2020-07-10 DIAGNOSIS — R109 Unspecified abdominal pain: Secondary | ICD-10-CM | POA: Diagnosis not present

## 2020-07-10 DIAGNOSIS — R1310 Dysphagia, unspecified: Secondary | ICD-10-CM | POA: Diagnosis not present

## 2020-07-10 DIAGNOSIS — C800 Disseminated malignant neoplasm, unspecified: Secondary | ICD-10-CM

## 2020-07-10 DIAGNOSIS — C786 Secondary malignant neoplasm of retroperitoneum and peritoneum: Secondary | ICD-10-CM | POA: Diagnosis not present

## 2020-07-10 DIAGNOSIS — C562 Malignant neoplasm of left ovary: Secondary | ICD-10-CM | POA: Diagnosis not present

## 2020-07-10 DIAGNOSIS — G629 Polyneuropathy, unspecified: Secondary | ICD-10-CM | POA: Insufficient documentation

## 2020-07-10 DIAGNOSIS — R1084 Generalized abdominal pain: Secondary | ICD-10-CM

## 2020-07-10 DIAGNOSIS — Z9221 Personal history of antineoplastic chemotherapy: Secondary | ICD-10-CM | POA: Insufficient documentation

## 2020-07-10 DIAGNOSIS — Z5111 Encounter for antineoplastic chemotherapy: Secondary | ICD-10-CM | POA: Insufficient documentation

## 2020-07-10 DIAGNOSIS — M47816 Spondylosis without myelopathy or radiculopathy, lumbar region: Secondary | ICD-10-CM | POA: Diagnosis not present

## 2020-07-10 DIAGNOSIS — C561 Malignant neoplasm of right ovary: Secondary | ICD-10-CM | POA: Diagnosis not present

## 2020-07-10 DIAGNOSIS — K5909 Other constipation: Secondary | ICD-10-CM

## 2020-07-10 DIAGNOSIS — I69359 Hemiplegia and hemiparesis following cerebral infarction affecting unspecified side: Secondary | ICD-10-CM | POA: Insufficient documentation

## 2020-07-10 DIAGNOSIS — G8191 Hemiplegia, unspecified affecting right dominant side: Secondary | ICD-10-CM

## 2020-07-10 DIAGNOSIS — C563 Malignant neoplasm of bilateral ovaries: Secondary | ICD-10-CM

## 2020-07-10 NOTE — Assessment & Plan Note (Signed)
We have discussed goals of care many times in the past She is in agreement to continue treatment with palliative intent 

## 2020-07-10 NOTE — Progress Notes (Signed)
DISCONTINUE ON PATHWAY REGIMEN - Ovarian     A cycle is every 21 days:     Gemcitabine      Carboplatin   **Always confirm dose/schedule in your pharmacy ordering system**  REASON: Disease Progression PRIOR TREATMENT: [id number]: Carboplatin AUC=4 D1 + Gemcitabine 800 mg/m2 D1, 8 q21 Days; Re-evaluate Every 3 Cycles, Treat until Complete Response, Unacceptable Toxicity, or Disease Progression TREATMENT RESPONSE: Progressive Disease (PD)  START OFF PATHWAY REGIMEN - Ovarian   [id number]:Carboplatin AUC=5 q21 Days:   A cycle is every 21 days:     Carboplatin   **Always confirm dose/schedule in your pharmacy ordering system**  Patient Characteristics: Recurrent or Progressive Disease, Third Line, Platinum Sensitive and ? 6 Months Since Last Platinum Therapy, BRCA Mutation Absent Therapeutic Status: Recurrent or Progressive Disease BRCA Mutation Status: Absent Line of Therapy: Third Line  Intent of Therapy: Non-Curative / Palliative Intent, Discussed with Patient

## 2020-07-10 NOTE — Assessment & Plan Note (Signed)
Unfortunately, CT imaging showed disease progression I have discontinued gemzar Thankfully she is not symptomatic She is surprised and shocked to hear the news She is still considered platinum sensitive  We have extensive discussions about treatment option With her prior experience and significant co-morbidities, I do not recommend combination treatment She has persistent neuropathy We discussed either single agent carboplatin or Taxol and we elected with single agent carboplatin  We discussed the role of chemotherapy. The intent is of palliative intent.  We discussed some of the risks, benefits, side-effects of carboplatin  Some of the short term side-effects included, though not limited to, including weight loss, life threatening infections, risk of allergic reactions, need for transfusions of blood products, nausea, vomiting, change in bowel habits, loss of hair, admission to hospital for various reasons, and risks of death.   Long term side-effects are also discussed including risks of infertility, permanent damage to nerve function, hearing loss, chronic fatigue, kidney damage with possibility needing hemodialysis, and rare secondary malignancy including bone marrow disorders.  The patient is aware that the response rates discussed earlier is not guaranteed.  After a long discussion, patient made an informed decision to proceed with the prescribed plan of care.  I will get her started next week I will talk to her and family next week

## 2020-07-10 NOTE — Assessment & Plan Note (Signed)
She has occasional abdominal pain could be due to disease She has pain medications to take as needed

## 2020-07-10 NOTE — Assessment & Plan Note (Signed)
She has expressive dysphagia from prior history of stroke I recommend her daughter to come with her for doctor's visit appointment in the future to facilitate accurate communication 

## 2020-07-10 NOTE — Progress Notes (Signed)
[place] OFFICE PROGRESS NOTE  Patient Care Team: Helane Rima, MD as PCP - General (Family Medicine) Encarnacion Slates, MD as Referring Physician (Neurology) Cameron Sprang, MD as Consulting Physician (Neurology)  ASSESSMENT & PLAN:  Ovarian cancer [facility]) Unfortunately, CT imaging showed disease progression I have discontinued gemzar Thankfully she is not symptomatic She is surprised and shocked to hear the news She is still considered platinum sensitive  We have extensive discussions about treatment option With her prior experience and significant co-morbidities, I do not recommend combination treatment She has persistent neuropathy We discussed either single agent carboplatin or Taxol and we elected with single agent carboplatin  We discussed the role of chemotherapy. The intent is of palliative intent.  We discussed some of the risks, benefits, side-effects of carboplatin  Some of the short term side-effects included, though not limited to, including weight loss, life threatening infections, risk of allergic reactions, need for transfusions of blood products, nausea, vomiting, change in bowel habits, loss of hair, admission to hospital for various reasons, and risks of death.   Long term side-effects are also discussed including risks of infertility, permanent damage to nerve function, hearing loss, chronic fatigue, kidney damage with possibility needing hemodialysis, and rare secondary malignancy including bone marrow disorders.  The patient is aware that the response rates discussed earlier is not guaranteed.  After a long discussion, patient made an informed decision to proceed with the prescribed plan of care.  I will get her started next week I will talk to her and family next week  Abdominal pain She has occasional abdominal pain could be due to disease She has pain medications to take as needed  Other constipation I recommend laxative therapy   Right  hemiparesis ([place]) She has expressive dysphagia from prior history of stroke I recommend her daughter to come with her for doctor's visit appointment in the future to facilitate accurate communication  Goals of care, counseling/discussion We have discussed goals of care many times in the past She is in agreement to continue treatment with palliative intent   Orders Placed This Encounter  Procedures  . CBC with Differential (Cancer Center Only)    Standing Status:   Standing    Number of Occurrences:   20    Standing Expiration Date:   07/10/2021    All questions were answered. The patient knows to call the clinic with any problems, questions or concerns. The total time spent in the appointment was 40 minutes encounter with patients including review of chart and various tests results, discussions about plan of care and coordination of care plan   Heath Lark, MD 07/10/2020 1:56 PM  INTERVAL HISTORY: Please see below for problem oriented charting. SHe returns with her daughter for follow-up She denies recent abdominal pain or nausea Her chronic constipation is stable  SUMMARY OF ONCOLOGIC HISTORY: Oncology History Overview Note  Negative genetic testing Progressed on Niraparib and gemzar   Carcinomatosis ([place])  12/06/2015 Initial Diagnosis   Carcinomatosis ([place])   07/18/2020 -  Chemotherapy   The patient had palonosetron (ALOXI) injection 0.25 mg, 0.25 mg, Intravenous,  Once, 0 of 4 cycles CARBOplatin (PARAPLATIN) 500 mg in sodium chloride 0.9 % 250 mL chemo infusion, 500 mg (100 % of original dose 498.5 mg), Intravenous,  Once, 0 of 4 cycles Dose modification:   (original dose 498.5 mg, Cycle 1) fosaprepitant (EMEND) 150 mg in sodium chloride 0.9 % 145 mL IVPB, 150 mg, Intravenous,  Once, 0 of 4  cycles  for chemotherapy treatment.    Ovarian cancer, bilateral ([place])  12/24/2015 Initial Diagnosis   Ovarian cancer, bilateral ([place])   07/18/2020 -  Chemotherapy   The patient had  palonosetron (ALOXI) injection 0.25 mg, 0.25 mg, Intravenous,  Once, 0 of 4 cycles CARBOplatin (PARAPLATIN) 500 mg in sodium chloride 0.9 % 250 mL chemo infusion, 500 mg (100 % of original dose 498.5 mg), Intravenous,  Once, 0 of 4 cycles Dose modification:   (original dose 498.5 mg, Cycle 1) fosaprepitant (EMEND) 150 mg in sodium chloride 0.9 % 145 mL IVPB, 150 mg, Intravenous,  Once, 0 of 4 cycles  for chemotherapy treatment.    Ovarian cancer ([place])  11/28/2015 Imaging   Ct scan of abdomen: Peritoneal carcinomatosis and pelvic/inguinal adenopathy. Gynecologic primary is favored.   12/17/2015 Pathology Results   Lymph node, needle/core biopsy, L inguinal LAN - METASTATIC ADENOCARCINOMA. Microscopic Comment Immunohistochemistry will be performed and reported as an addendum. (JDP:ecj 12/18/2015) ADDENDUM: Immunohistochemistry shows strong positivity with cytokeratin 7, estrogen receptor (ER), progesterone receptor (PR), p53 and WT1. Negative markers are cytokeratin 20 , CDX- 2 and gross cystic disease fluid protein. 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 (JDP:kh 12-19-15   12/17/2015 Procedure   Technically successful ultrasound guided core left inguinal adenopathy biopsy   12/24/2015 Tumor Marker   Patient's tumor was tested for the following markers: CA-125 Results of the tumor marker test revealed 608.2   12/27/2015 - 02/28/2016 Chemotherapy   She received neoadjuvant chemo x 3 cycles   01/01/2016 Tumor Marker   Patient's tumor was tested for the following markers: CA-125 Results of the tumor marker test revealed 741.6   01/29/2016 Procedure   Successful placement of a right internal jugular approach power injectable Port-A-Cath. The catheter is ready for immediate use   01/29/2016 Imaging   US abdomen 1. Hyperechoic 2.5 x 1.0 x 1.6 cm lesion in the region the caudate lobe of the liver.  Similar finding noted on prior CT of 11/28/2015. This could represent hemangioma. This could represent a malignancy including metastatic disease. 2. Exam otherwise unremarkable. No gallstones. No biliary distention.   02/03/2016 Tumor Marker   Patient's tumor was tested for the following markers: CA-125 Results of the tumor marker test revealed 137.1   02/20/2016 Imaging   MRI brain No acute infarct.  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  No intracranial mass.  Moderate small vessel disease changes.  Global atrophy without hydrocephalus.  Expanded partially empty sella without secondary findings of pseudotumor cerebri.  Minimal mucosal thickening ethmoid sinus air cells. Small air-fluid levels maxillary sinuses bilaterally may indicate changes of acute sinusitis.    02/28/2016 Tumor Marker   Patient's tumor was tested for the following markers: CA-125 Results of the tumor marker test revealed 33.6   03/02/2016 Imaging   CT chest, abdomen and pelvis 1. Today's study demonstrates a positive response to therapy with resolution of the previously noted malignant ascites, significant regression of previously noted peritoneal implants, and regression of previously noted lymphadenopathy in the abdomen and pelvis. 2. No definite evidence to suggest metastatic disease to the thorax. 3. Stable 1.0 x 1.7 cm intermediate attenuation lesion associated with the posterior aspect of segment 1 of the liver, favored to represent a mildly proteinaceous hepatic cyst. The possibility of a peritoneal implant in this region is not entirely excluded, but is not favored on today's examination. 4. Extensive post infectious  scarring inguinal upper right lung, likely sequela of prior necrotizing pneumonia. 5. Multiple tiny pulmonary nodules scattered throughout the lungs bilaterally all measuring 4 mm or less. These are nonspecific, but favored to be benign. Given the patient's  history of primary gynecologic malignancy, attention on followup studies is recommended to ensure the stability of these findings. 6. Additional incidental findings, as above   03/14/2016 Imaging   CT angiogram 1. No pulmonary emboli. 2. Chronic changes to the right upper lobe. 3. Stable lesion in the caudate lobe of the liver. 4. Stable soft tissue prominence to the right of the trachea as described above. 5. Stable small nodule in the right lung.    03/24/2016 Pathology Results   1. Omentum, resection for tumor - FOCAL ADENOCARCINOMA ASSOCIATED WITH EXTENSIVE FIBROSIS, INFLAMMATION AND HEMOSIDERIN DEPOSITION. 2. Uterus +/- tubes/ovaries, neoplastic, cervix - CERVIX: SLIGHT CERVICITIS AND SQUAMOUS METAPLASIA. - ENDOMETRIUM: ATROPHIC, NO HYPERPLASIA OR MALIGNANCY. - MYOMETRIUM: LEIOMYOMATA WITH DEGENERATIVE CHANGES. NO MALIGNANCY. - UTERINE SEROSA: FOCAL ADENOCARCINOMA ASSOCIATED WITH ADHESIONS. - RIGHT OVARY: BENIGN CALCIFIED NODULE AND BENIGN SEROUS CYST. NO MALIGNANCY IDENTIFIED. - RIGHT FALLOPIAN TUBE: UNREMARKABLE. NO MALIGNANCY IDENTIFIED. - LEFT OVARY: FOCAL ADENOCARCINOMA ASSOCIATED WITH EXTENSIVE FIBROSIS. - LEFT FALLOPIAN TUBE: HYDROSALPINX. NO MALIGNANCY IDENTIFIED. 3. Lymph node, biopsy, right external iliac - METASTATIC ADENOCARCINOMA, 4. Soft tissue, biopsy, left para colic gutter - FOCAL ADENOCARCINOMA ASSOCIATED WITH FIBROSIS. Microscopic Comment 2. OVARY Specimen(s): Uterus with bilateral ovaries, omentum, right external iliac lymph node and left paracolic gutter. Procedure: (including lymph node sampling) Hysterectomy with bilateral salpingo-oophorectomy, omentectomy, lymph node biopsy and paracolic gutter biopsy. Primary tumor site (including laterality): Left ovary. Ovarian surface involvement: Yes Ovarian capsule intact without fragmentation: N/A Maximum tumor size (cm): 2 cm, 2 cm Histologic type: Serous carcinoma. Grade: 3 Peritoneal implants: (specify  invasive or non-invasive): Left paracolic gutter positive for carcinoma Pelvic extension (list additional structures on separate lines and if involved): Omentum, right paracolic gutter and uterine serosa. Lymph nodes: number examined 1 ; number positive 1 TNM code: ypT3a, ypN1b, ypMX FIGO Stage (based on pathologic findings, needs clinical correlation): IIIA2 Comments: The left ovary has a 2 cm nodule, which is composed primarily of fibrous tissue with small foci of residual adenocarcinoma. There is also microscopic involvement of the uterine serosa, the left paracolic gutter biopsy and the omentum. The left paracolic gutter and omental involvement is microscopic and both are associated with extensive fibrosis with focal hemosiderin deposition. The right external iliac node is completely replaced by metastatic adenocarcinoma with serous features. (JDP:ecj 03/30/2016)   03/24/2016 Surgery   Operation: Robotic-assisted laparoscopic total hysterectomy with bilateral salpingoophorectomy, omentectomy, radical tumor debulking  Surgeon: Donaciano Eva  Operative Findings:  : small nodules in omentum, no ascites. Omental nodule (2cm) adherent to lower anterior abdominal wall. 2cm left colonic gutter cystic nodule. 6cm right external iliac lymph node. Grossly normal ovaries and tubes. Fibroid uterus. No gross residual disease at completion of surgery representing R0 optimal/complete resection.    04/17/2016 Imaging   CT abdomen and pelvis 1. There is no evidence of acute inflammatory process within abdomen or pelvis. 2. Stable low-density lesion within caudate lobe of the liver. No new hepatic lesions are noted. 3. Again noted lobulated renal contour and multifocal renal cortical scarring. No hydronephrosis or hydroureter. 4. No significant mesenteric adenopathy. No retroperitoneal adenopathy. Stable minimal residual peritoneal thickening within pelvis. No new pelvic implants or pelvic  ascites. 5. Status post hysterectomy.  Unremarkable urinary bladder. 6. Moderate stool within colon as  described above. No evidence of colitis.    04/24/2016 - 06/19/2016 Chemotherapy   She received 3 more cycles of chemo after surgery   05/14/2016 Tumor Marker   Patient's tumor was tested for the following markers: CA-125 Results of the tumor marker test revealed 22.5   06/04/2016 Tumor Marker   Patient's tumor was tested for the following markers: CA-125 Results of the tumor marker test revealed 13.7   07/07/2016 Genetic Testing   Patient has genetic testing done for breast/ovasrian cancer panel Results revealed patient has no actionable mutation   07/23/2016 Imaging   Ct abdomen and pelvis 1. Heterogeneously enhancing 4.9 x 3.2 x 4.4 cm mass along the right pelvic sidewall may represent locally recurrent disease or malignant lymphadenopathy. 2. No other signs of definite metastatic disease noted elsewhere in the abdomen or pelvis. 3. Stable low-attenuation hepatic lesion in the caudate lobe of the liver, which appears to demonstrates some progressive centripetal filling on delayed images. This lesion is presumably benign given its stability compared to prior studies, and is favored to represent a small cavernous hemangioma. 4. Cardiomegaly with biatrial dilatation, which is very severe on the right side. 5. Aortic atherosclerosis. 6. Additional incidental findings, as above.   07/23/2016 Tumor Marker   Patient's tumor was tested for the following markers: CA-125 Results of the tumor marker test revealed 12.3   09/30/2016 Imaging   Ct abdomen and pelvis: 1. 4.9 cm heterogeneously enhancing mass seen along the right pelvic sidewall previously has almost completely resolved. There is some ill-defined soft tissue attenuation/peritoneal thickening in this region today but no discrete measurable lesion is evident. 2. No new or progressive findings on today's exam. 3. Stable hypo  attenuating lesion in the caudate lobe of the liver. 4. Subtle aortic atherosclerosis   09/30/2016 Tumor Marker   Patient's tumor was tested for the following markers: CA-125 Results of the tumor marker test revealed 9.6   10/05/2016 Pathology Results   Vagina, biopsy, left cuff - BENIGN FIBROEPITHELIAL (STROMAL) POLYP. - NO DYSPLASIA, ATYPIA OR MALIGNANCY IDENTIFIED.   01/14/2017 Tumor Marker   Patient's tumor was tested for the following markers: CA-125 Results of the tumor marker test revealed 11.9   05/05/2017 Tumor Marker   Patient's tumor was tested for the following markers: CA-125 Results of the tumor marker test revealed 28   06/14/2017 Tumor Marker   Patient's tumor was tested for the following markers: CA-125 Results of the tumor marker test revealed 45.7   06/24/2017 Imaging   Ct abdomen and pelvis: Increased peritoneal metastatic disease in abdomen and pelvis since prior exam. No evidence of ascites. Stable small benign hepatic hemangioma.   07/21/2017 Tumor Marker   Patient's tumor was tested for the following markers: CA-125 Results of the tumor marker test revealed 84.1   07/21/2017 - 11/03/2017 Chemotherapy   She received carboplatin and taxol   08/11/2017 Adverse Reaction   She had severe neuropathy due to treatment. Does of chemo is reduced starting cycle 2 onwards   09/02/2017 Tumor Marker   Patient's tumor was tested for the following markers: CA-125 Results of the tumor marker test revealed 27.7   09/16/2017 Imaging   CT CHEST IMPRESSION  1. No acute process or evidence of metastatic disease in the chest. 2. Right upper lung bronchiectasis, consolidation, and architectural distortion are most likely post infectious or inflammatory and not significantly changed. 3. Aortic Atherosclerosis (ICD10-I70.0).  CT ABDOMEN AND PELVIS IMPRESSION  1. Response to therapy of nodal and peritoneal metastasis.  2. No new or progressive disease. 3. Caudate lobe  hemangioma, as before. 4. Bilateral renal cortical thinning. Similar minimal right-sided caliectasis and hydroureter. Likely secondary to low-grade obstruction by the dominant right pelvic implant.   09/22/2017 Tumor Marker   Patient's tumor was tested for the following markers: CA-125 Results of the tumor marker test revealed 18   11/03/2017 Tumor Marker   Patient's tumor was tested for the following markers: CA-125 Results of the tumor marker test revealed 13.4   11/25/2017 Imaging   1. Stable to slight interval decrease in size of nodal and peritoneal metastatic disease within the abdomen. 2. No new or progressive disease.   12/03/2017 - 09/11/2019 Chemotherapy   The patient is prescribed [place]   01/17/2018 Tumor Marker   Patient's tumor was tested for the following markers: CA-125 Results of the tumor marker test revealed 11.9   02/22/2018 Tumor Marker   Patient's tumor was tested for the following markers: CA-125 Results of the tumor marker test revealed 11.7   02/22/2018 Imaging   1. Improved nodular thickening along the vaginal cuff on the left and medial to the right iliac vessels, likely treated implants. No evidence of progressive peritoneal disease. 2. Stable hepatic hemangioma. No evidence of solid visceral organ metastases. 3. Bilateral renal cortical scarring.   05/19/2018 Tumor Marker   Patient's tumor was tested for the following markers: CA-125 Results of the tumor marker test revealed 9.9   08/10/2018 Imaging   Stable mild nodular thickening of the left vaginal cuff. No new or progressive disease within the abdomen or pelvis.  Stable small hepatic hemangioma.   09/21/2018 Tumor Marker   Patient's tumor was tested for the following markers: CA-125 Results of the tumor marker test revealed 10.3   11/11/2018 Tumor Marker   Patient's tumor was tested for the following markers: CA-125 Results of the tumor marker test revealed 10.5   12/15/2018 Tumor Marker    Patient's tumor was tested for the following markers: CA-125 Results of the tumor marker test revealed 12.6   01/06/2019 Tumor Marker   Patient's tumor was tested for the following markers: CA-125 Results of the tumor marker test revealed 14.6   01/12/2019 Imaging   1. Chronic extensive right upper lobe scarring changes and bronchiectasis. 2. No mediastinal or hilar mass or adenopathy and no findings for pulmonary metastatic disease. 3. Stable caudate lobe hemangioma. 4. No worrisome omental or peritoneal surface lesions. 5. Stable pericecal and small pelvic lymph nodes. No new or progressive findings.    09/11/2019 Imaging   Ct abdomen and pelvis Pericecal peritoneal metastatic implants   09/19/2019 - 07/04/2020 Chemotherapy   The patient had carboplatin and gemxar for chemotherapy treatment, then on gemzar maintenance since 11/28/2019    11/20/2019 Imaging   1. Mild decrease in size of peritoneal tumor implant along the lateral aspect of the cecum. 2. Stable tiny sub-cm mesenteric soft tissue nodules or lymph nodes in right lower quadrant and anterior pelvic mesentery. 3. No new or progressive disease identified within the abdomen or pelvis.   03/12/2020 Imaging   1. No acute findings within the abdomen or pelvis. 2. Stable appearance of right lower quadrant peritoneal implant adjacent to cecum. 3. Increase in size of right lower quadrant ileocolic lymph node. 1.2 cm on today's study versus 0.8 previously. 4. Possible early avascular necrosis involving the left femoral head.   Aortic Atherosclerosis (ICD10-I70.0).   07/04/2020 Tumor Marker   Patient's tumor was tested for the following markers: CA-125 Results  of the tumor marker test revealed 73   07/09/2020 Imaging   1. Enlarging noncalcified implants in the ileocolonic mesentery, suspicious for metastatic disease in this patient with a rising serum CA 125 level. There is a questionable new peritoneal implant associated with the  distal small bowel. No resulting bowel obstruction. 2. No evidence of visceral metastatic disease. 3. Aortic Atherosclerosis (ICD10-I70.0).   07/18/2020 -  Chemotherapy   The patient had palonosetron (ALOXI) injection 0.25 mg, 0.25 mg, Intravenous,  Once, 0 of 4 cycles CARBOplatin (PARAPLATIN) 500 mg in sodium chloride 0.9 % 250 mL chemo infusion, 500 mg (100 % of original dose 498.5 mg), Intravenous,  Once, 0 of 4 cycles Dose modification:   (original dose 498.5 mg, Cycle 1) fosaprepitant (EMEND) 150 mg in sodium chloride 0.9 % 145 mL IVPB, 150 mg, Intravenous,  Once, 0 of 4 cycles  for chemotherapy treatment.      REVIEW OF SYSTEMS:   Constitutional: Denies fevers, chills or abnormal weight loss Eyes: Denies blurriness of vision Ears, nose, mouth, throat, and face: Denies mucositis or sore throat Respiratory: Denies cough, dyspnea or wheezes Cardiovascular: Denies palpitation, chest discomfort or lower extremity swelling Gastrointestinal:  Denies nausea, heartburn or change in bowel habits Skin: Denies abnormal skin rashes Lymphatics: Denies new lymphadenopathy or easy bruising Neurological:Denies numbness, tingling or new weaknesses Behavioral/Psych: Mood is stable, no new changes  All other systems were reviewed with the patient and are negative.  I have reviewed the past medical history, past surgical history, social history and family history with the patient and they are unchanged from previous note.  ALLERGIES:  is allergic to codeine, lactulose, and latex.  MEDICATIONS:  Current Outpatient Medications  Medication Sig Dispense Refill  . apixaban (ELIQUIS) 5 MG TABS tablet Take 1 tablet (5 mg total) by mouth 2 (two) times daily. 60 tablet 5  . atorvastatin (LIPITOR) 40 MG tablet Take 40 mg by mouth at bedtime.    . gabapentin (NEURONTIN) 600 MG tablet TAKE 1 TABLET(600 MG) BY MOUTH TWICE DAILY 60 tablet 11  . lidocaine-prilocaine (EMLA) cream Apply to Porta-Cath 1-2 hours  prior to access as directed. 30 g 1  . metoprolol succinate (TOPROL-XL) 25 MG 24 hr tablet Take 25 mg by mouth daily.   5  . Multiple Vitamin (MULTIVITAMIN WITH MINERALS) TABS tablet Take 1 tablet by mouth daily.    Marland Kitchen omega-3 acid ethyl esters (LOVAZA) 1 g capsule Take 1 g by mouth daily.    . ondansetron (ZOFRAN ODT) 4 MG disintegrating tablet Take 1 tablet (4 mg total) by mouth every 8 (eight) hours as needed for nausea or vomiting. 60 tablet 9  . oxyCODONE (OXY IR/ROXICODONE) 5 MG immediate release tablet Take 1 tablet (5 mg total) by mouth every 6 (six) hours as needed for severe pain. 60 tablet 0  . PARoxetine (PAXIL) 40 MG tablet Take 40 mg by mouth daily.  0  . polyethylene glycol (MIRALAX / GLYCOLAX) packet Take 17 g by mouth daily.     No current facility-administered medications for this visit.    PHYSICAL EXAMINATION: ECOG PERFORMANCE STATUS: 2 - Symptomatic, <50% confined to bed  Vitals:   07/10/20 0948  BP: 129/76  Pulse: 75  Resp: 18  Temp: 98.9 F (37.2 C)  SpO2: 99%   Filed Weights   07/10/20 0948  Weight: 199 lb 3.2 oz (90.4 kg)    GENERAL:alert, no distress and comfortable NEURO: alert & oriented x 3 with persistent expressive dysphasia  LABORATORY DATA:  I have reviewed the data as listed    Component Value Date/Time   NA 141 07/04/2020 1020   NA 142 12/08/2017 1051   K 4.0 07/04/2020 1020   K 4.4 12/08/2017 1051   CL 106 07/04/2020 1020   CL 105 06/14/2017 1430   CO2 27 07/04/2020 1020   CO2 28 12/08/2017 1051   GLUCOSE 94 07/04/2020 1020   GLUCOSE 97 12/08/2017 1051   BUN 9 07/04/2020 1020   BUN 16.2 12/08/2017 1051   CREATININE 0.74 07/04/2020 1020   CREATININE 0.75 06/06/2020 1049   CREATININE 0.9 12/08/2017 1051   CALCIUM 9.8 07/04/2020 1020   CALCIUM 9.5 12/08/2017 1051   PROT 7.2 07/04/2020 1020   PROT 6.9 12/08/2017 1051   ALBUMIN 3.3 (L) 07/04/2020 1020   ALBUMIN 3.6 12/08/2017 1051   AST 26 07/04/2020 1020   AST 21 06/06/2020 1049    AST 18 12/08/2017 1051   ALT 26 07/04/2020 1020   ALT 22 06/06/2020 1049   ALT 20 12/08/2017 1051   ALKPHOS 140 (H) 07/04/2020 1020   ALKPHOS 131 12/08/2017 1051   BILITOT 0.6 07/04/2020 1020   BILITOT 0.5 06/06/2020 1049   BILITOT 0.70 12/08/2017 1051   GFRNONAA >60 07/04/2020 1020   GFRNONAA >60 06/06/2020 1049   GFRAA >60 07/04/2020 1020   GFRAA >60 06/06/2020 1049    No results found for: SPEP, UPEP  Lab Results  Component Value Date   WBC 6.2 07/04/2020   NEUTROABS 3.8 07/04/2020   HGB 12.0 07/04/2020   HCT 35.6 (L) 07/04/2020   MCV 106.3 (H) 07/04/2020   PLT 179 07/04/2020      Chemistry      Component Value Date/Time   NA 141 07/04/2020 1020   NA 142 12/08/2017 1051   K 4.0 07/04/2020 1020   K 4.4 12/08/2017 1051   CL 106 07/04/2020 1020   CL 105 06/14/2017 1430   CO2 27 07/04/2020 1020   CO2 28 12/08/2017 1051   BUN 9 07/04/2020 1020   BUN 16.2 12/08/2017 1051   CREATININE 0.74 07/04/2020 1020   CREATININE 0.75 06/06/2020 1049   CREATININE 0.9 12/08/2017 1051      Component Value Date/Time   CALCIUM 9.8 07/04/2020 1020   CALCIUM 9.5 12/08/2017 1051   ALKPHOS 140 (H) 07/04/2020 1020   ALKPHOS 131 12/08/2017 1051   AST 26 07/04/2020 1020   AST 21 06/06/2020 1049   AST 18 12/08/2017 1051   ALT 26 07/04/2020 1020   ALT 22 06/06/2020 1049   ALT 20 12/08/2017 1051   BILITOT 0.6 07/04/2020 1020   BILITOT 0.5 06/06/2020 1049   BILITOT 0.70 12/08/2017 1051       RADIOGRAPHIC STUDIES:I reviewed imaging studies with her and her daughter I have personally reviewed the radiological images as listed and agreed with the findings in the report. CT ABDOMEN PELVIS W CONTRAST  Result Date: 07/10/2020 CLINICAL DATA:  Restaging ovarian cancer diagnosed in 2018. Remote history of breast cancer. EXAM: CT ABDOMEN AND PELVIS WITH CONTRAST TECHNIQUE: Multidetector CT imaging of the abdomen and pelvis was performed using the standard protocol following bolus  administration of intravenous contrast. CONTRAST:  19m OMNIPAQUE IOHEXOL 300 MG/ML  SOLN COMPARISON:  Abdominopelvic CT 03/12/2020 and 11/20/2019 FINDINGS: Lower chest: Stable mild linear scarring or atelectasis at both lung bases. No significant pleural or pericardial effusion. Hepatobiliary: Stable low-density lesion in the caudate lobe, previously characterized as a hemangioma. No definite new or suspicious  liver lesions. No evidence of gallstones, gallbladder wall thickening or biliary dilatation. Pancreas: Unremarkable. No pancreatic ductal dilatation or surrounding inflammatory changes. Spleen: Normal in size without focal abnormality. Adrenals/Urinary Tract: Both adrenal glands appear normal. Stable bilateral renal cortical scarring and stable small cyst anteriorly in the interpolar region of the right kidney. No evidence of urinary tract calculus or hydronephrosis. The bladder appears unremarkable for its degree of distention. Stomach/Bowel: No evidence of bowel wall thickening, distention or surrounding inflammatory change. There is a questionable new peritoneal implant associated with the distal small bowel, measuring 3.4 x 2.0 cm on image 50/2. No resulting bowel obstruction. Vascular/Lymphatic: There are no enlarged abdominal or pelvic lymph nodes. Mild aortic atherosclerosis. No acute vascular findings. The portal, superior mesenteric and splenic veins are patent. Reproductive: Stable appearance post hysterectomy.  No adnexal mass. Other: Partially calcified peritoneal implant anteriorly in the right lower quadrant is stable, measuring 2.0 x 1.5 cm on image 55/2. There are enlarging noncalcified implants in the ileocolonic mesentery, largest measuring 1.9 x 1.6 cm on image 44/2. The enlargement of these lesions is more obvious on the coronal images. As above, there is a possible peritoneal implant associated with the distal small bowel. No ascites or generalized peritoneal nodularity. Musculoskeletal:  No acute or significant osseous findings. Stable subchondral sclerosis posteriorly in the left femoral head which may reflect atypical AVN or subchondral cyst formation. Stable multilevel lumbar spondylosis. IMPRESSION: 1. Enlarging noncalcified implants in the ileocolonic mesentery, suspicious for metastatic disease in this patient with a rising serum CA 125 level. There is a questionable new peritoneal implant associated with the distal small bowel. No resulting bowel obstruction. 2. No evidence of visceral metastatic disease. 3. Aortic Atherosclerosis (ICD10-I70.0). Electronically Signed   By: Richardean Sale M.D.   On: 07/10/2020 08:20

## 2020-07-10 NOTE — Assessment & Plan Note (Signed)
I recommend laxative therapy  

## 2020-07-12 NOTE — Progress Notes (Signed)
Pharmacist Chemotherapy Monitoring - Follow Up Assessment    I verify that I have reviewed each item in the below checklist:  . Regimen for the patient is scheduled for the appropriate day and plan matches scheduled date. Marland Kitchen Appropriate non-routine labs are ordered dependent on drug ordered. . If applicable, additional medications reviewed and ordered per protocol based on lifetime cumulative doses and/or treatment regimen.   Plan for follow-up and/or issues identified: No . I-vent associated with next due treatment: No . MD and/or nursing notified: No  Brenda Cunningham 07/12/2020 11:37 AM

## 2020-07-15 ENCOUNTER — Other Ambulatory Visit: Payer: Self-pay | Admitting: Hematology and Oncology

## 2020-07-18 ENCOUNTER — Other Ambulatory Visit: Payer: Self-pay | Admitting: Hematology and Oncology

## 2020-07-18 ENCOUNTER — Inpatient Hospital Stay: Payer: Medicare (Managed Care)

## 2020-07-18 ENCOUNTER — Inpatient Hospital Stay (HOSPITAL_BASED_OUTPATIENT_CLINIC_OR_DEPARTMENT_OTHER): Payer: Medicare (Managed Care) | Admitting: Hematology and Oncology

## 2020-07-18 ENCOUNTER — Encounter: Payer: Self-pay | Admitting: Hematology and Oncology

## 2020-07-18 ENCOUNTER — Other Ambulatory Visit: Payer: Self-pay

## 2020-07-18 DIAGNOSIS — C563 Malignant neoplasm of bilateral ovaries: Secondary | ICD-10-CM

## 2020-07-18 DIAGNOSIS — R1084 Generalized abdominal pain: Secondary | ICD-10-CM

## 2020-07-18 DIAGNOSIS — C562 Malignant neoplasm of left ovary: Secondary | ICD-10-CM | POA: Diagnosis not present

## 2020-07-18 DIAGNOSIS — K5909 Other constipation: Secondary | ICD-10-CM | POA: Diagnosis not present

## 2020-07-18 DIAGNOSIS — C800 Disseminated malignant neoplasm, unspecified: Secondary | ICD-10-CM

## 2020-07-18 DIAGNOSIS — Z7189 Other specified counseling: Secondary | ICD-10-CM

## 2020-07-18 DIAGNOSIS — Z95828 Presence of other vascular implants and grafts: Secondary | ICD-10-CM

## 2020-07-18 DIAGNOSIS — Z5111 Encounter for antineoplastic chemotherapy: Secondary | ICD-10-CM | POA: Diagnosis not present

## 2020-07-18 DIAGNOSIS — C561 Malignant neoplasm of right ovary: Secondary | ICD-10-CM

## 2020-07-18 DIAGNOSIS — G8191 Hemiplegia, unspecified affecting right dominant side: Secondary | ICD-10-CM | POA: Diagnosis not present

## 2020-07-18 LAB — CBC WITH DIFFERENTIAL (CANCER CENTER ONLY)
Abs Immature Granulocytes: 0.01 10*3/uL (ref 0.00–0.07)
Basophils Absolute: 0 10*3/uL (ref 0.0–0.1)
Basophils Relative: 0 %
Eosinophils Absolute: 0.1 10*3/uL (ref 0.0–0.5)
Eosinophils Relative: 1 %
HCT: 34.9 % — ABNORMAL LOW (ref 36.0–46.0)
Hemoglobin: 11.6 g/dL — ABNORMAL LOW (ref 12.0–15.0)
Immature Granulocytes: 0 %
Lymphocytes Relative: 30 %
Lymphs Abs: 1.7 10*3/uL (ref 0.7–4.0)
MCH: 35.4 pg — ABNORMAL HIGH (ref 26.0–34.0)
MCHC: 33.2 g/dL (ref 30.0–36.0)
MCV: 106.4 fL — ABNORMAL HIGH (ref 80.0–100.0)
Monocytes Absolute: 0.8 10*3/uL (ref 0.1–1.0)
Monocytes Relative: 13 %
Neutro Abs: 3.2 10*3/uL (ref 1.7–7.7)
Neutrophils Relative %: 56 %
Platelet Count: 185 10*3/uL (ref 150–400)
RBC: 3.28 MIL/uL — ABNORMAL LOW (ref 3.87–5.11)
RDW: 17.2 % — ABNORMAL HIGH (ref 11.5–15.5)
WBC Count: 5.8 10*3/uL (ref 4.0–10.5)
nRBC: 0 % (ref 0.0–0.2)

## 2020-07-18 LAB — CMP (CANCER CENTER ONLY)
ALT: 25 U/L (ref 0–44)
AST: 26 U/L (ref 15–41)
Albumin: 3.4 g/dL — ABNORMAL LOW (ref 3.5–5.0)
Alkaline Phosphatase: 132 U/L — ABNORMAL HIGH (ref 38–126)
Anion gap: 8 (ref 5–15)
BUN: 12 mg/dL (ref 8–23)
CO2: 26 mmol/L (ref 22–32)
Calcium: 8.9 mg/dL (ref 8.9–10.3)
Chloride: 105 mmol/L (ref 98–111)
Creatinine: 0.72 mg/dL (ref 0.44–1.00)
GFR, Est AFR Am: >60 mL/min (ref >60)
GFR, Estimated: >60 mL/min (ref >60)
Glucose, Bld: 96 mg/dL (ref 70–99)
Potassium: 3.7 mmol/L (ref 3.5–5.1)
Sodium: 139 mmol/L (ref 135–145)
Total Bilirubin: 0.5 mg/dL (ref 0.3–1.2)
Total Protein: 7 g/dL (ref 6.5–8.1)

## 2020-07-18 MED ORDER — SODIUM CHLORIDE 0.9 % IV SOLN
150.0000 mg | Freq: Once | INTRAVENOUS | Status: AC
  Administered 2020-07-18: 150 mg via INTRAVENOUS
  Filled 2020-07-18: qty 150

## 2020-07-18 MED ORDER — OXYCODONE HCL 10 MG PO TABS: 10.0000 mg | ORAL_TABLET | Freq: Four times a day (QID) | ORAL | 0 refills | Status: DC | PRN

## 2020-07-18 MED ORDER — DIPHENHYDRAMINE HCL 25 MG PO CAPS
25.0000 mg | ORAL_CAPSULE | Freq: Once | ORAL | Status: AC
  Administered 2020-07-18: 25 mg via ORAL

## 2020-07-18 MED ORDER — HEPARIN SOD (PORK) LOCK FLUSH 100 UNIT/ML IV SOLN
500.0000 [IU] | Freq: Once | INTRAVENOUS | Status: AC | PRN
  Administered 2020-07-18: 500 [IU]
  Filled 2020-07-18: qty 5

## 2020-07-18 MED ORDER — OXYCODONE HCL 5 MG PO TABS
10.0000 mg | ORAL_TABLET | Freq: Once | ORAL | Status: AC
  Administered 2020-07-18: 10 mg via ORAL

## 2020-07-18 MED ORDER — DIPHENHYDRAMINE HCL 25 MG PO CAPS
ORAL_CAPSULE | ORAL | Status: AC
  Filled 2020-07-18: qty 1

## 2020-07-18 MED ORDER — SODIUM CHLORIDE 0.9% FLUSH
10.0000 mL | INTRAVENOUS | Status: DC | PRN
  Administered 2020-07-18: 10 mL
  Filled 2020-07-18: qty 10

## 2020-07-18 MED ORDER — SODIUM CHLORIDE 0.9 % IV SOLN
Freq: Once | INTRAVENOUS | Status: AC
  Filled 2020-07-18: qty 250

## 2020-07-18 MED ORDER — DIPHENHYDRAMINE HCL 50 MG/ML IJ SOLN
INTRAMUSCULAR | Status: AC
  Filled 2020-07-18: qty 1

## 2020-07-18 MED ORDER — SODIUM CHLORIDE 0.9 % IV SOLN
10.0000 mg | Freq: Once | INTRAVENOUS | Status: AC
  Administered 2020-07-18: 10 mg via INTRAVENOUS
  Filled 2020-07-18: qty 10

## 2020-07-18 MED ORDER — OXYCODONE HCL 5 MG PO TABS
ORAL_TABLET | ORAL | Status: AC
  Filled 2020-07-18: qty 2

## 2020-07-18 MED ORDER — FAMOTIDINE IN NACL 20-0.9 MG/50ML-% IV SOLN
20.0000 mg | Freq: Once | INTRAVENOUS | Status: AC
  Administered 2020-07-18: 20 mg via INTRAVENOUS

## 2020-07-18 MED ORDER — PALONOSETRON HCL INJECTION 0.25 MG/5ML
INTRAVENOUS | Status: AC
  Filled 2020-07-18: qty 5

## 2020-07-18 MED ORDER — SODIUM CHLORIDE 0.9% FLUSH
10.0000 mL | Freq: Once | INTRAVENOUS | Status: AC
  Administered 2020-07-18: 10 mL
  Filled 2020-07-18: qty 10

## 2020-07-18 MED ORDER — FAMOTIDINE IN NACL 20-0.9 MG/50ML-% IV SOLN
INTRAVENOUS | Status: AC
  Filled 2020-07-18: qty 50

## 2020-07-18 MED ORDER — SODIUM CHLORIDE 0.9 % IV SOLN
498.5000 mg | Freq: Once | INTRAVENOUS | Status: AC
  Administered 2020-07-18: 500 mg via INTRAVENOUS
  Filled 2020-07-18: qty 50

## 2020-07-18 MED ORDER — PALONOSETRON HCL INJECTION 0.25 MG/5ML
0.2500 mg | Freq: Once | INTRAVENOUS | Status: AC
  Administered 2020-07-18: 0.25 mg via INTRAVENOUS

## 2020-07-18 NOTE — Assessment & Plan Note (Signed)
This could be due to her disease I recommend increasing the dose of oxycodone from 5 mg to 10 mg as needed We discussed narcotic refill policy I also spoke with her daughter about this It is imperative that the patient avoid constipation while on narcotic prescription

## 2020-07-18 NOTE — Patient Instructions (Signed)

## 2020-07-18 NOTE — Assessment & Plan Note (Signed)
She is complaining of a lot of abdominal pain which I suspect is due to her disease We will proceed with treatment as scheduled I will see her back in 3 weeks for further follow-up We discussed the importance of pain management and avoidance of constipation

## 2020-07-18 NOTE — Assessment & Plan Note (Signed)
She has expressive dysphagia from prior history of stroke I recommend her daughter to come with her for doctor's visit appointment in the future to facilitate accurate communication 

## 2020-07-18 NOTE — Patient Instructions (Signed)
[facility] Discharge Instructions for Patients Receiving Chemotherapy  Today you received the following chemotherapy agents: Carboplatin.  To help prevent nausea and vomiting after your treatment, we encourage you to take your nausea medication as prescribed.   If you develop nausea and vomiting that is not controlled by your nausea medication, call the clinic.   BELOW ARE SYMPTOMS THAT SHOULD BE REPORTED IMMEDIATELY:  *FEVER GREATER THAN 100.5 F  *CHILLS WITH OR WITHOUT FEVER  NAUSEA AND VOMITING THAT IS NOT CONTROLLED WITH YOUR NAUSEA MEDICATION  *UNUSUAL SHORTNESS OF BREATH  *UNUSUAL BRUISING OR BLEEDING  TENDERNESS IN MOUTH AND THROAT WITH OR WITHOUT PRESENCE OF ULCERS  *URINARY PROBLEMS  *BOWEL PROBLEMS  UNUSUAL RASH Items with * indicate a potential emergency and should be followed up as soon as possible.  Feel free to call the clinic should you have any questions or concerns. The clinic phone number is [phone].  Please show the CHEMO ALERT CARD at check-in to the Emergency Department and triage nurse.     

## 2020-07-18 NOTE — Progress Notes (Signed)
[place] OFFICE PROGRESS NOTE  Patient Care Team: Helane Rima, MD as PCP - General (Family Medicine) Encarnacion Slates, MD as Referring Physician (Neurology) Cameron Sprang, MD as Consulting Physician (Neurology)  ASSESSMENT & PLAN:  Ovarian cancer [facility]) She is complaining of a lot of abdominal pain which I suspect is due to her disease We will proceed with treatment as scheduled I will see her back in 3 weeks for further follow-up We discussed the importance of pain management and avoidance of constipation  Abdominal pain This could be due to her disease I recommend increasing the dose of oxycodone from 5 mg to 10 mg as needed We discussed narcotic refill policy I also spoke with her daughter about this It is imperative that the patient avoid constipation while on narcotic prescription  Other constipation I recommend laxative therapy   Right hemiparesis ([place]) She has expressive dysphagia from prior history of stroke I recommend her daughter to come with her for doctor's visit appointment in the future to facilitate accurate communication   No orders of the defined types were placed in this encounter.   All questions were answered. The patient knows to call the clinic with any problems, questions or concerns. The total time spent in the appointment was 20 minutes encounter with patients including review of chart and various tests results, discussions about plan of care and coordination of care plan   Heath Lark, MD 07/18/2020 1:52 PM  INTERVAL HISTORY: Please see below for problem oriented charting. She is seen before her treatment I also spoke with her daughter afterwards She is complaining of severe intermittent abdominal pain She felt that the 5 mg oxycodone was not enough She denies constipation but her daughter did state that occasionally she had constipation alternate with frequent bowel movement No recent nausea  SUMMARY OF ONCOLOGIC  HISTORY: Oncology History Overview Note  Negative genetic testing Progressed on Niraparib and gemzar   Carcinomatosis ([place])  12/06/2015 Initial Diagnosis   Carcinomatosis ([place])   07/18/2020 -  Chemotherapy   The patient had palonosetron (ALOXI) injection 0.25 mg, 0.25 mg, Intravenous,  Once, 1 of 4 cycles CARBOplatin (PARAPLATIN) 500 mg in sodium chloride 0.9 % 250 mL chemo infusion, 500 mg (100 % of original dose 498.5 mg), Intravenous,  Once, 1 of 4 cycles Dose modification:   (original dose 498.5 mg, Cycle 1) fosaprepitant (EMEND) 150 mg in sodium chloride 0.9 % 145 mL IVPB, 150 mg, Intravenous,  Once, 1 of 4 cycles  for chemotherapy treatment.    Ovarian cancer, bilateral ([place])  12/24/2015 Initial Diagnosis   Ovarian cancer, bilateral ([place])   07/18/2020 -  Chemotherapy   The patient had palonosetron (ALOXI) injection 0.25 mg, 0.25 mg, Intravenous,  Once, 1 of 4 cycles CARBOplatin (PARAPLATIN) 500 mg in sodium chloride 0.9 % 250 mL chemo infusion, 500 mg (100 % of original dose 498.5 mg), Intravenous,  Once, 1 of 4 cycles Dose modification:   (original dose 498.5 mg, Cycle 1) fosaprepitant (EMEND) 150 mg in sodium chloride 0.9 % 145 mL IVPB, 150 mg, Intravenous,  Once, 1 of 4 cycles  for chemotherapy treatment.    Ovarian cancer ([place])  11/28/2015 Imaging   Ct scan of abdomen: Peritoneal carcinomatosis and pelvic/inguinal adenopathy. Gynecologic primary is favored.   12/17/2015 Pathology Results   Lymph node, needle/core biopsy, L inguinal LAN - METASTATIC ADENOCARCINOMA. Microscopic Comment Immunohistochemistry will be performed and reported as an addendum. (JDP:ecj 12/18/2015) ADDENDUM: Immunohistochemistry shows strong positivity with cytokeratin 7,  estrogen receptor (ER), progesterone receptor (PR), p53 and WT1. Negative markers are cytokeratin 20 , CDX- 2 and gross cystic disease fluid protein. The morphology and immunophenotype are most consistent with a high grade gynecologic  carcinoma including high grade ovarian serous carcinoma. (JDP:kh 12-19-15   12/17/2015 Procedure   Technically successful ultrasound guided core left inguinal adenopathy biopsy   12/24/2015 Tumor Marker   Patient's tumor was tested for the following markers: CA-125 Results of the tumor marker test revealed 608.2   12/27/2015 - 02/28/2016 Chemotherapy   She received neoadjuvant chemo x 3 cycles   01/01/2016 Tumor Marker   Patient's tumor was tested for the following markers: CA-125 Results of the tumor marker test revealed 741.6   01/29/2016 Procedure   Successful placement of a right internal jugular approach power injectable Port-A-Cath. The catheter is ready for immediate use   01/29/2016 Imaging   US abdomen 1. Hyperechoic 2.5 x 1.0 x 1.6 cm lesion in the region the caudate lobe of the liver. Similar finding noted on prior CT of 11/28/2015. This could represent hemangioma. This could represent a malignancy including metastatic disease. 2. Exam otherwise unremarkable. No gallstones. No biliary distention.   02/03/2016 Tumor Marker   Patient's tumor was tested for the following markers: CA-125 Results of the tumor marker test revealed 137.1   02/20/2016 Imaging   MRI brain No acute infarct.  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  No intracranial mass.  Moderate small vessel disease changes.  Global atrophy without hydrocephalus.  Expanded partially empty sella without secondary findings of pseudotumor cerebri.  Minimal mucosal thickening ethmoid sinus air cells. Small air-fluid levels maxillary sinuses bilaterally may indicate changes of acute sinusitis.    02/28/2016 Tumor Marker   Patient's tumor was tested for the following markers: CA-125 Results of the tumor marker test revealed 33.6   03/02/2016 Imaging   CT chest, abdomen and pelvis 1. Today's study demonstrates a positive response to therapy with resolution of the previously noted  malignant ascites, significant regression of previously noted peritoneal implants, and regression of previously noted lymphadenopathy in the abdomen and pelvis. 2. No definite evidence to suggest metastatic disease to the thorax. 3. Stable 1.0 x 1.7 cm intermediate attenuation lesion associated with the posterior aspect of segment 1 of the liver, favored to represent a mildly proteinaceous hepatic cyst. The possibility of a peritoneal implant in this region is not entirely excluded, but is not favored on today's examination. 4. Extensive post infectious scarring inguinal upper right lung, likely sequela of prior necrotizing pneumonia. 5. Multiple tiny pulmonary nodules scattered throughout the lungs bilaterally all measuring 4 mm or less. These are nonspecific, but favored to be benign. Given the patient's history of primary gynecologic malignancy, attention on followup studies is recommended to ensure the stability of these findings. 6. Additional incidental findings, as above   03/14/2016 Imaging   CT angiogram 1. No pulmonary emboli. 2. Chronic changes to the right upper lobe. 3. Stable lesion in the caudate lobe of the liver. 4. Stable soft tissue prominence to the right of the trachea as described above. 5. Stable small nodule in the right lung.    03/24/2016 Pathology Results   1. Omentum, resection for tumor - FOCAL ADENOCARCINOMA ASSOCIATED WITH EXTENSIVE FIBROSIS, INFLAMMATION AND HEMOSIDERIN DEPOSITION. 2. Uterus +/- tubes/ovaries, neoplastic, cervix - CERVIX: SLIGHT CERVICITIS AND SQUAMOUS METAPLASIA. - ENDOMETRIUM: ATROPHIC, NO HYPERPLASIA OR MALIGNANCY. - MYOMETRIUM: LEIOMYOMATA WITH DEGENERATIVE CHANGES. NO MALIGNANCY. - UTERINE SEROSA: FOCAL ADENOCARCINOMA ASSOCIATED WITH  ADHESIONS. - RIGHT OVARY: BENIGN CALCIFIED NODULE AND BENIGN SEROUS CYST. NO MALIGNANCY IDENTIFIED. - RIGHT FALLOPIAN TUBE: UNREMARKABLE. NO MALIGNANCY IDENTIFIED. - LEFT OVARY: FOCAL ADENOCARCINOMA ASSOCIATED  WITH EXTENSIVE FIBROSIS. - LEFT FALLOPIAN TUBE: HYDROSALPINX. NO MALIGNANCY IDENTIFIED. 3. Lymph node, biopsy, right external iliac - METASTATIC ADENOCARCINOMA, 4. Soft tissue, biopsy, left para colic gutter - FOCAL ADENOCARCINOMA ASSOCIATED WITH FIBROSIS. Microscopic Comment 2. OVARY Specimen(s): Uterus with bilateral ovaries, omentum, right external iliac lymph node and left paracolic gutter. Procedure: (including lymph node sampling) Hysterectomy with bilateral salpingo-oophorectomy, omentectomy, lymph node biopsy and paracolic gutter biopsy. Primary tumor site (including laterality): Left ovary. Ovarian surface involvement: Yes Ovarian capsule intact without fragmentation: N/A Maximum tumor size (cm): 2 cm, 2 cm Histologic type: Serous carcinoma. Grade: 3 Peritoneal implants: (specify invasive or non-invasive): Left paracolic gutter positive for carcinoma Pelvic extension (list additional structures on separate lines and if involved): Omentum, right paracolic gutter and uterine serosa. Lymph nodes: number examined 1 ; number positive 1 TNM code: ypT3a, ypN1b, ypMX FIGO Stage (based on pathologic findings, needs clinical correlation): IIIA2 Comments: The left ovary has a 2 cm nodule, which is composed primarily of fibrous tissue with small foci of residual adenocarcinoma. There is also microscopic involvement of the uterine serosa, the left paracolic gutter biopsy and the omentum. The left paracolic gutter and omental involvement is microscopic and both are associated with extensive fibrosis with focal hemosiderin deposition. The right external iliac node is completely replaced by metastatic adenocarcinoma with serous features. (JDP:ecj 03/30/2016)   03/24/2016 Surgery   Operation: Robotic-assisted laparoscopic total hysterectomy with bilateral salpingoophorectomy, omentectomy, radical tumor debulking  Surgeon: Donaciano Eva  Operative Findings:  : small nodules in omentum, no  ascites. Omental nodule (2cm) adherent to lower anterior abdominal wall. 2cm left colonic gutter cystic nodule. 6cm right external iliac lymph node. Grossly normal ovaries and tubes. Fibroid uterus. No gross residual disease at completion of surgery representing R0 optimal/complete resection.    04/17/2016 Imaging   CT abdomen and pelvis 1. There is no evidence of acute inflammatory process within abdomen or pelvis. 2. Stable low-density lesion within caudate lobe of the liver. No new hepatic lesions are noted. 3. Again noted lobulated renal contour and multifocal renal cortical scarring. No hydronephrosis or hydroureter. 4. No significant mesenteric adenopathy. No retroperitoneal adenopathy. Stable minimal residual peritoneal thickening within pelvis. No new pelvic implants or pelvic ascites. 5. Status post hysterectomy.  Unremarkable urinary bladder. 6. Moderate stool within colon as described above. No evidence of colitis.    04/24/2016 - 06/19/2016 Chemotherapy   She received 3 more cycles of chemo after surgery   05/14/2016 Tumor Marker   Patient's tumor was tested for the following markers: CA-125 Results of the tumor marker test revealed 22.5   06/04/2016 Tumor Marker   Patient's tumor was tested for the following markers: CA-125 Results of the tumor marker test revealed 13.7   07/07/2016 Genetic Testing   Patient has genetic testing done for breast/ovasrian cancer panel Results revealed patient has no actionable mutation   07/23/2016 Imaging   Ct abdomen and pelvis 1. Heterogeneously enhancing 4.9 x 3.2 x 4.4 cm mass along the right pelvic sidewall may represent locally recurrent disease or malignant lymphadenopathy. 2. No other signs of definite metastatic disease noted elsewhere in the abdomen or pelvis. 3. Stable low-attenuation hepatic lesion in the caudate lobe of the liver, which appears to demonstrates some progressive centripetal filling on delayed images. This lesion is  presumably benign given its  stability compared to prior studies, and is favored to represent a small cavernous hemangioma. 4. Cardiomegaly with biatrial dilatation, which is very severe on the right side. 5. Aortic atherosclerosis. 6. Additional incidental findings, as above.   07/23/2016 Tumor Marker   Patient's tumor was tested for the following markers: CA-125 Results of the tumor marker test revealed 12.3   09/30/2016 Imaging   Ct abdomen and pelvis: 1. 4.9 cm heterogeneously enhancing mass seen along the right pelvic sidewall previously has almost completely resolved. There is some ill-defined soft tissue attenuation/peritoneal thickening in this region today but no discrete measurable lesion is evident. 2. No new or progressive findings on today's exam. 3. Stable hypo attenuating lesion in the caudate lobe of the liver. 4. Subtle aortic atherosclerosis   09/30/2016 Tumor Marker   Patient's tumor was tested for the following markers: CA-125 Results of the tumor marker test revealed 9.6   10/05/2016 Pathology Results   Vagina, biopsy, left cuff - BENIGN FIBROEPITHELIAL (STROMAL) POLYP. - NO DYSPLASIA, ATYPIA OR MALIGNANCY IDENTIFIED.   01/14/2017 Tumor Marker   Patient's tumor was tested for the following markers: CA-125 Results of the tumor marker test revealed 11.9   05/05/2017 Tumor Marker   Patient's tumor was tested for the following markers: CA-125 Results of the tumor marker test revealed 28   06/14/2017 Tumor Marker   Patient's tumor was tested for the following markers: CA-125 Results of the tumor marker test revealed 45.7   06/24/2017 Imaging   Ct abdomen and pelvis: Increased peritoneal metastatic disease in abdomen and pelvis since prior exam. No evidence of ascites. Stable small benign hepatic hemangioma.   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 Does of chemo is reduced starting cycle 2 onwards   09/02/2017 Tumor Marker   Patient's tumor was tested for the following markers: CA-125 Results of the tumor marker test revealed 27.7   09/16/2017 Imaging   CT CHEST IMPRESSION  1. No acute process or evidence of metastatic disease in the chest. 2. Right upper lung bronchiectasis, consolidation, and architectural distortion are most likely post infectious or inflammatory and not significantly changed. 3. Aortic Atherosclerosis (ICD10-I70.0).  CT ABDOMEN AND PELVIS IMPRESSION  1. Response to therapy of nodal and peritoneal metastasis. 2. No new or progressive disease. 3. Caudate lobe hemangioma, as before. 4. Bilateral renal cortical thinning. Similar minimal right-sided caliectasis and hydroureter. Likely secondary to low-grade obstruction by the dominant right pelvic implant.   09/22/2017 Tumor Marker   Patient's tumor was tested for the following markers: CA-125 Results of the tumor marker test revealed 18   11/03/2017 Tumor Marker   Patient's tumor was tested for the following markers: CA-125 Results of the tumor marker test revealed 13.4   11/25/2017 Imaging   1. Stable to slight interval decrease in size of nodal and peritoneal metastatic disease within the abdomen. 2. No new or progressive disease.   12/03/2017 - 09/11/2019 Chemotherapy   The patient is prescribed [place]   01/17/2018 Tumor Marker   Patient's tumor was tested for the following markers: CA-125 Results of the tumor marker test revealed 11.9   02/22/2018 Tumor Marker   Patient's tumor was tested for the following markers: CA-125 Results of the tumor marker test revealed 11.7  02/22/2018 Imaging   1. Improved nodular thickening along the vaginal cuff on the left and medial to the right iliac vessels, likely treated implants. No  evidence of progressive peritoneal disease. 2. Stable hepatic hemangioma. No evidence of solid visceral organ metastases. 3. Bilateral renal cortical scarring.   05/19/2018 Tumor Marker   Patient's tumor was tested for the following markers: CA-125 Results of the tumor marker test revealed 9.9   08/10/2018 Imaging   Stable mild nodular thickening of the left vaginal cuff. No new or progressive disease within the abdomen or pelvis.  Stable small hepatic hemangioma.   09/21/2018 Tumor Marker   Patient's tumor was tested for the following markers: CA-125 Results of the tumor marker test revealed 10.3   11/11/2018 Tumor Marker   Patient's tumor was tested for the following markers: CA-125 Results of the tumor marker test revealed 10.5   12/15/2018 Tumor Marker   Patient's tumor was tested for the following markers: CA-125 Results of the tumor marker test revealed 12.6   01/06/2019 Tumor Marker   Patient's tumor was tested for the following markers: CA-125 Results of the tumor marker test revealed 14.6   01/12/2019 Imaging   1. Chronic extensive right upper lobe scarring changes and bronchiectasis. 2. No mediastinal or hilar mass or adenopathy and no findings for pulmonary metastatic disease. 3. Stable caudate lobe hemangioma. 4. No worrisome omental or peritoneal surface lesions. 5. Stable pericecal and small pelvic lymph nodes. No new or progressive findings.    09/11/2019 Imaging   Ct abdomen and pelvis Pericecal peritoneal metastatic implants   09/19/2019 - 07/04/2020 Chemotherapy   The patient had carboplatin and gemxar for chemotherapy treatment, then on gemzar maintenance since 11/28/2019    11/20/2019 Imaging   1. Mild decrease in size of peritoneal tumor implant along the lateral aspect of the cecum. 2. Stable tiny sub-cm mesenteric soft tissue nodules or lymph nodes in right lower quadrant and anterior pelvic mesentery. 3. No new or progressive disease identified within the  abdomen or pelvis.   03/12/2020 Imaging   1. No acute findings within the abdomen or pelvis. 2. Stable appearance of right lower quadrant peritoneal implant adjacent to cecum. 3. Increase in size of right lower quadrant ileocolic lymph node. 1.2 cm on today's study versus 0.8 previously. 4. Possible early avascular necrosis involving the left femoral head.   Aortic Atherosclerosis (ICD10-I70.0).   07/04/2020 Tumor Marker   Patient's tumor was tested for the following markers: CA-125 Results of the tumor marker test revealed 73   07/09/2020 Imaging   1. Enlarging noncalcified implants in the ileocolonic mesentery, suspicious for metastatic disease in this patient with a rising serum CA 125 level. There is a questionable new peritoneal implant associated with the distal small bowel. No resulting bowel obstruction. 2. No evidence of visceral metastatic disease. 3. Aortic Atherosclerosis (ICD10-I70.0).   07/18/2020 -  Chemotherapy   The patient had palonosetron (ALOXI) injection 0.25 mg, 0.25 mg, Intravenous,  Once, 1 of 4 cycles CARBOplatin (PARAPLATIN) 500 mg in sodium chloride 0.9 % 250 mL chemo infusion, 500 mg (100 % of original dose 498.5 mg), Intravenous,  Once, 1 of 4 cycles Dose modification:   (original dose 498.5 mg, Cycle 1) fosaprepitant (EMEND) 150 mg in sodium chloride 0.9 % 145 mL IVPB, 150 mg, Intravenous,  Once, 1 of 4 cycles  for chemotherapy treatment.      REVIEW OF SYSTEMS:   Constitutional: Denies fevers, chills or abnormal weight loss Eyes: Denies blurriness of  vision Ears, nose, mouth, throat, and face: Denies mucositis or sore throat Respiratory: Denies cough, dyspnea or wheezes Cardiovascular: Denies palpitation, chest discomfort or lower extremity swelling Gastrointestinal:  Denies nausea, heartburn or change in bowel habits Skin: Denies abnormal skin rashes Lymphatics: Denies new lymphadenopathy or easy bruising Neurological:Denies numbness, tingling or new  weaknesses Behavioral/Psych: Mood is stable, no new changes  All other systems were reviewed with the patient and are negative.  I have reviewed the past medical history, past surgical history, social history and family history with the patient and they are unchanged from previous note.  ALLERGIES:  is allergic to codeine, lactulose, and latex.  MEDICATIONS:  Current Outpatient Medications  Medication Sig Dispense Refill  . apixaban (ELIQUIS) 5 MG TABS tablet Take 1 tablet (5 mg total) by mouth 2 (two) times daily. 60 tablet 5  . atorvastatin (LIPITOR) 40 MG tablet Take 40 mg by mouth at bedtime.    . gabapentin (NEURONTIN) 600 MG tablet TAKE 1 TABLET(600 MG) BY MOUTH TWICE DAILY 60 tablet 11  . lidocaine-prilocaine (EMLA) cream Apply to Porta-Cath 1-2 hours prior to access as directed. 30 g 1  . metoprolol succinate (TOPROL-XL) 25 MG 24 hr tablet Take 25 mg by mouth daily.   5  . Multiple Vitamin (MULTIVITAMIN WITH MINERALS) TABS tablet Take 1 tablet by mouth daily.    Marland Kitchen omega-3 acid ethyl esters (LOVAZA) 1 g capsule Take 1 g by mouth daily.    . ondansetron (ZOFRAN ODT) 4 MG disintegrating tablet Take 1 tablet (4 mg total) by mouth every 8 (eight) hours as needed for nausea or vomiting. 60 tablet 9  . oxyCODONE 10 MG TABS Take 1 tablet (10 mg total) by mouth every 6 (six) hours as needed for severe pain. 60 tablet 0  . PARoxetine (PAXIL) 40 MG tablet Take 40 mg by mouth daily.  0  . polyethylene glycol (MIRALAX / GLYCOLAX) packet Take 17 g by mouth daily.     No current facility-administered medications for this visit.   Facility-Administered Medications Ordered in Other Visits  Medication Dose Route Frequency Provider Last Rate Last Admin  . CARBOplatin (PARAPLATIN) 500 mg in sodium chloride 0.9 % 250 mL chemo infusion  500 mg Intravenous Once Alvy Bimler, Helene Bernstein, MD      . dexamethasone (DECADRON) 10 mg in sodium chloride 0.9 % 50 mL IVPB  10 mg Intravenous Once Alvy Bimler, Harli Engelken, MD 204 mL/hr at  07/18/20 1337 10 mg at 07/18/20 1337  . fosaprepitant (EMEND) 150 mg in sodium chloride 0.9 % 145 mL IVPB  150 mg Intravenous Once Alvy Bimler, Arnoldo Hildreth, MD      . heparin lock flush 100 unit/mL  500 Units Intracatheter Once PRN Alvy Bimler, Rini Moffit, MD      . sodium chloride flush (NS) 0.9 % injection 10 mL  10 mL Intracatheter PRN Alvy Bimler, Mark Hassey, MD        PHYSICAL EXAMINATION: ECOG PERFORMANCE STATUS: 2 - Symptomatic, <50% confined to bed  Vitals:   07/18/20 1152  BP: 126/70  Pulse: 88  Resp: 16  Temp: (!) 97 F (36.1 C)  SpO2: 100%   Filed Weights   07/18/20 1152  Weight: 200 lb 6.4 oz (90.9 kg)    GENERAL:alert, no distress and comfortable SKIN: skin color, texture, turgor are normal, no rashes or significant lesions EYES: normal, Conjunctiva are pink and non-injected, sclera clear OROPHARYNX:no exudate, no erythema and lips, buccal mucosa, and tongue normal  NECK: supple, thyroid normal size, non-tender, without nodularity LYMPH:  no palpable lymphadenopathy in the cervical, axillary or inguinal LUNGS: clear to auscultation and percussion with normal breathing effort HEART: regular rate & rhythm and no murmurs and no lower extremity edema ABDOMEN:abdomen soft, non-tender and normal bowel sounds Musculoskeletal:no cyanosis of digits and no clubbing  NEURO: alert & oriented x 3 with dysarthria  LABORATORY DATA:  I have reviewed the data as listed    Component Value Date/Time   NA 139 07/18/2020 1141   NA 142 12/08/2017 1051   K 3.7 07/18/2020 1141   K 4.4 12/08/2017 1051   CL 105 07/18/2020 1141   CL 105 06/14/2017 1430   CO2 26 07/18/2020 1141   CO2 28 12/08/2017 1051   GLUCOSE 96 07/18/2020 1141   GLUCOSE 97 12/08/2017 1051   BUN 12 07/18/2020 1141   BUN 16.2 12/08/2017 1051   CREATININE 0.72 07/18/2020 1141   CREATININE 0.9 12/08/2017 1051   CALCIUM 8.9 07/18/2020 1141   CALCIUM 9.5 12/08/2017 1051   PROT 7.0 07/18/2020 1141   PROT 6.9 12/08/2017 1051   ALBUMIN 3.4 (L)  07/18/2020 1141   ALBUMIN 3.6 12/08/2017 1051   AST 26 07/18/2020 1141   AST 18 12/08/2017 1051   ALT 25 07/18/2020 1141   ALT 20 12/08/2017 1051   ALKPHOS 132 (H) 07/18/2020 1141   ALKPHOS 131 12/08/2017 1051   BILITOT 0.5 07/18/2020 1141   BILITOT 0.70 12/08/2017 1051   GFRNONAA >60 07/18/2020 1141   GFRAA >60 07/18/2020 1141    No results found for: SPEP, UPEP  Lab Results  Component Value Date   WBC 5.8 07/18/2020   NEUTROABS 3.2 07/18/2020   HGB 11.6 (L) 07/18/2020   HCT 34.9 (L) 07/18/2020   MCV 106.4 (H) 07/18/2020   PLT 185 07/18/2020      Chemistry      Component Value Date/Time   NA 139 07/18/2020 1141   NA 142 12/08/2017 1051   K 3.7 07/18/2020 1141   K 4.4 12/08/2017 1051   CL 105 07/18/2020 1141   CL 105 06/14/2017 1430   CO2 26 07/18/2020 1141   CO2 28 12/08/2017 1051   BUN 12 07/18/2020 1141   BUN 16.2 12/08/2017 1051   CREATININE 0.72 07/18/2020 1141   CREATININE 0.9 12/08/2017 1051      Component Value Date/Time   CALCIUM 8.9 07/18/2020 1141   CALCIUM 9.5 12/08/2017 1051   ALKPHOS 132 (H) 07/18/2020 1141   ALKPHOS 131 12/08/2017 1051   AST 26 07/18/2020 1141   AST 18 12/08/2017 1051   ALT 25 07/18/2020 1141   ALT 20 12/08/2017 1051   BILITOT 0.5 07/18/2020 1141   BILITOT 0.70 12/08/2017 1051

## 2020-07-18 NOTE — Assessment & Plan Note (Signed)
I recommend laxative therapy

## 2020-07-19 ENCOUNTER — Other Ambulatory Visit: Payer: Self-pay | Admitting: Hematology and Oncology

## 2020-07-19 ENCOUNTER — Telehealth: Payer: Self-pay | Admitting: Oncology

## 2020-07-19 LAB — CA 125: Cancer Antigen (CA) 125: 123 U/mL — ABNORMAL HIGH (ref 0.0–38.1)

## 2020-07-19 MED ORDER — OXYCODONE HCL 10 MG PO TABS: 10.0000 mg | ORAL_TABLET | Freq: Four times a day (QID) | ORAL | 0 refills | Status: DC | PRN

## 2020-07-19 NOTE — Telephone Encounter (Signed)
Brenda Cunningham (daughter) called and said [facility] on [address] is out of oxycodone 10 mg.  She is wondering if the prescription can be resent to [facility] on [address].

## 2020-07-19 NOTE — Telephone Encounter (Signed)
Called [name] back and let her know the prescription has been sent.

## 2020-07-19 NOTE — Telephone Encounter (Signed)
done

## 2020-08-08 ENCOUNTER — Inpatient Hospital Stay: Payer: Medicare (Managed Care)

## 2020-08-08 ENCOUNTER — Encounter: Payer: Self-pay | Admitting: Hematology and Oncology

## 2020-08-08 ENCOUNTER — Inpatient Hospital Stay (HOSPITAL_BASED_OUTPATIENT_CLINIC_OR_DEPARTMENT_OTHER): Payer: Medicare (Managed Care) | Admitting: Hematology and Oncology

## 2020-08-08 ENCOUNTER — Inpatient Hospital Stay: Payer: Medicare (Managed Care) | Attending: Hematology and Oncology

## 2020-08-08 ENCOUNTER — Other Ambulatory Visit: Payer: Self-pay

## 2020-08-08 ENCOUNTER — Telehealth: Payer: Self-pay | Admitting: Hematology and Oncology

## 2020-08-08 DIAGNOSIS — Z7189 Other specified counseling: Secondary | ICD-10-CM

## 2020-08-08 DIAGNOSIS — C562 Malignant neoplasm of left ovary: Secondary | ICD-10-CM

## 2020-08-08 DIAGNOSIS — Z5111 Encounter for antineoplastic chemotherapy: Secondary | ICD-10-CM | POA: Diagnosis present

## 2020-08-08 DIAGNOSIS — K5909 Other constipation: Secondary | ICD-10-CM | POA: Diagnosis not present

## 2020-08-08 DIAGNOSIS — Z23 Encounter for immunization: Secondary | ICD-10-CM | POA: Insufficient documentation

## 2020-08-08 DIAGNOSIS — D61818 Other pancytopenia: Secondary | ICD-10-CM | POA: Diagnosis not present

## 2020-08-08 DIAGNOSIS — C541 Malignant neoplasm of endometrium: Secondary | ICD-10-CM | POA: Insufficient documentation

## 2020-08-08 DIAGNOSIS — C786 Secondary malignant neoplasm of retroperitoneum and peritoneum: Secondary | ICD-10-CM | POA: Insufficient documentation

## 2020-08-08 DIAGNOSIS — C800 Disseminated malignant neoplasm, unspecified: Secondary | ICD-10-CM

## 2020-08-08 DIAGNOSIS — C563 Malignant neoplasm of bilateral ovaries: Secondary | ICD-10-CM

## 2020-08-08 DIAGNOSIS — Z95828 Presence of other vascular implants and grafts: Secondary | ICD-10-CM

## 2020-08-08 LAB — CBC WITH DIFFERENTIAL/PLATELET
Abs Immature Granulocytes: 0 10*3/uL (ref 0.00–0.07)
Basophils Absolute: 0 10*3/uL (ref 0.0–0.1)
Basophils Relative: 0 %
Eosinophils Absolute: 0.1 10*3/uL (ref 0.0–0.5)
Eosinophils Relative: 2 %
HCT: 33.3 % — ABNORMAL LOW (ref 36.0–46.0)
Hemoglobin: 11 g/dL — ABNORMAL LOW (ref 12.0–15.0)
Immature Granulocytes: 0 %
Lymphocytes Relative: 38 %
Lymphs Abs: 1.4 10*3/uL (ref 0.7–4.0)
MCH: 35.4 pg — ABNORMAL HIGH (ref 26.0–34.0)
MCHC: 33 g/dL (ref 30.0–36.0)
MCV: 107.1 fL — ABNORMAL HIGH (ref 80.0–100.0)
Monocytes Absolute: 0.5 10*3/uL (ref 0.1–1.0)
Monocytes Relative: 13 %
Neutro Abs: 1.8 10*3/uL (ref 1.7–7.7)
Neutrophils Relative %: 47 %
Platelets: 156 10*3/uL (ref 150–400)
RBC: 3.11 MIL/uL — ABNORMAL LOW (ref 3.87–5.11)
RDW: 17.3 % — ABNORMAL HIGH (ref 11.5–15.5)
WBC: 3.8 10*3/uL — ABNORMAL LOW (ref 4.0–10.5)
nRBC: 0 % (ref 0.0–0.2)

## 2020-08-08 LAB — COMPREHENSIVE METABOLIC PANEL
ALT: 18 U/L (ref 0–44)
AST: 18 U/L (ref 15–41)
Albumin: 3.1 g/dL — ABNORMAL LOW (ref 3.5–5.0)
Alkaline Phosphatase: 160 U/L — ABNORMAL HIGH (ref 38–126)
Anion gap: 5 (ref 5–15)
BUN: 12 mg/dL (ref 8–23)
CO2: 30 mmol/L (ref 22–32)
Calcium: 10.1 mg/dL (ref 8.9–10.3)
Chloride: 106 mmol/L (ref 98–111)
Creatinine, Ser: 0.72 mg/dL (ref 0.44–1.00)
GFR calc Af Amer: >60 mL/min (ref >60)
GFR calc non Af Amer: >60 mL/min (ref >60)
Glucose, Bld: 109 mg/dL — ABNORMAL HIGH (ref 70–99)
Potassium: 4 mmol/L (ref 3.5–5.1)
Sodium: 141 mmol/L (ref 135–145)
Total Bilirubin: 0.4 mg/dL (ref 0.3–1.2)
Total Protein: 6.9 g/dL (ref 6.5–8.1)

## 2020-08-08 MED ORDER — DIPHENHYDRAMINE HCL 25 MG PO CAPS
ORAL_CAPSULE | ORAL | Status: AC
  Filled 2020-08-08: qty 2

## 2020-08-08 MED ORDER — SODIUM CHLORIDE 0.9% FLUSH
10.0000 mL | INTRAVENOUS | Status: DC | PRN
  Administered 2020-08-08: 10 mL
  Filled 2020-08-08: qty 10

## 2020-08-08 MED ORDER — PALONOSETRON HCL INJECTION 0.25 MG/5ML
INTRAVENOUS | Status: AC
  Filled 2020-08-08: qty 5

## 2020-08-08 MED ORDER — SODIUM CHLORIDE 0.9 % IV SOLN
Freq: Once | INTRAVENOUS | Status: AC
  Filled 2020-08-08: qty 250

## 2020-08-08 MED ORDER — SODIUM CHLORIDE 0.9% FLUSH
10.0000 mL | Freq: Once | INTRAVENOUS | Status: AC
  Administered 2020-08-08: 10 mL
  Filled 2020-08-08: qty 10

## 2020-08-08 MED ORDER — SODIUM CHLORIDE 0.9 % IV SOLN
10.0000 mg | Freq: Once | INTRAVENOUS | Status: AC
  Administered 2020-08-08: 10 mg via INTRAVENOUS
  Filled 2020-08-08: qty 10

## 2020-08-08 MED ORDER — DIPHENHYDRAMINE HCL 25 MG PO CAPS
25.0000 mg | ORAL_CAPSULE | Freq: Once | ORAL | Status: AC
  Administered 2020-08-08: 25 mg via ORAL

## 2020-08-08 MED ORDER — PALONOSETRON HCL INJECTION 0.25 MG/5ML
0.2500 mg | Freq: Once | INTRAVENOUS | Status: AC
  Administered 2020-08-08: 0.25 mg via INTRAVENOUS

## 2020-08-08 MED ORDER — FAMOTIDINE IN NACL 20-0.9 MG/50ML-% IV SOLN
INTRAVENOUS | Status: AC
  Filled 2020-08-08: qty 50

## 2020-08-08 MED ORDER — SODIUM CHLORIDE 0.9 % IV SOLN
500.0000 mg | Freq: Once | INTRAVENOUS | Status: AC
  Administered 2020-08-08: 500 mg via INTRAVENOUS
  Filled 2020-08-08: qty 50

## 2020-08-08 MED ORDER — HEPARIN SOD (PORK) LOCK FLUSH 100 UNIT/ML IV SOLN
500.0000 [IU] | Freq: Once | INTRAVENOUS | Status: AC | PRN
  Administered 2020-08-08: 500 [IU]
  Filled 2020-08-08: qty 5

## 2020-08-08 MED ORDER — FAMOTIDINE IN NACL 20-0.9 MG/50ML-% IV SOLN
20.0000 mg | Freq: Once | INTRAVENOUS | Status: AC
  Administered 2020-08-08: 20 mg via INTRAVENOUS

## 2020-08-08 MED ORDER — SODIUM CHLORIDE 0.9 % IV SOLN
150.0000 mg | Freq: Once | INTRAVENOUS | Status: AC
  Administered 2020-08-08: 150 mg via INTRAVENOUS
  Filled 2020-08-08: qty 150

## 2020-08-08 NOTE — Telephone Encounter (Signed)
Scheduled appts per 9/2 sch msg. Pt declined print out of AVS and stated she would refer to mychart.

## 2020-08-08 NOTE — Patient Instructions (Signed)
[facility] Discharge Instructions for Patients Receiving Chemotherapy  Today you received the following chemotherapy agents Carboplatin  To help prevent nausea and vomiting after your treatment, we encourage you to take your nausea medication as directed   If you develop nausea and vomiting that is not controlled by your nausea medication, call the clinic.   BELOW ARE SYMPTOMS THAT SHOULD BE REPORTED IMMEDIATELY:  *FEVER GREATER THAN 100.5 F  *CHILLS WITH OR WITHOUT FEVER  NAUSEA AND VOMITING THAT IS NOT CONTROLLED WITH YOUR NAUSEA MEDICATION  *UNUSUAL SHORTNESS OF BREATH  *UNUSUAL BRUISING OR BLEEDING  TENDERNESS IN MOUTH AND THROAT WITH OR WITHOUT PRESENCE OF ULCERS  *URINARY PROBLEMS  *BOWEL PROBLEMS  UNUSUAL RASH Items with * indicate a potential emergency and should be followed up as soon as possible.  Feel free to call the clinic should you have any questions or concerns. The clinic phone number is [phone].  Please show the CHEMO ALERT CARD at check-in to the Emergency Department and triage nurse.   

## 2020-08-08 NOTE — Assessment & Plan Note (Signed)
She has consistent pancytopenia with each cycle of treatment We will proceed with treatment today She does not need transfusion support

## 2020-08-08 NOTE — Assessment & Plan Note (Signed)
Overall, she tolerated treatment well I recommend minimum 4 doses of chemo before repeat imaging study We discussed the importance of management of constipation while on treatment

## 2020-08-08 NOTE — Progress Notes (Signed)
[facility] OFFICE PROGRESS NOTE  Patient Care Team: Helane Rima, MD as PCP - General (Family Medicine) Encarnacion Slates, MD as Referring Physician (Neurology) Cameron Sprang, MD as Consulting Physician (Neurology)  ASSESSMENT & PLAN:  Ovarian cancer (Brenda Cunningham) Overall, she tolerated treatment well I recommend minimum 4 doses of chemo before repeat imaging study We discussed the importance of management of constipation while on treatment  Pancytopenia, acquired Brenda Cunningham) She has consistent pancytopenia with each cycle of treatment We will proceed with treatment today She does not need transfusion support  Other constipation We have had extensive discussions about management of constipation over the years I reinforce the importance of taking laxatives on a regular basis   No orders of the defined types were placed in this encounter.   All questions were answered. The patient knows to call the clinic with any problems, questions or concerns. The total time spent in the appointment was 20 minutes encounter with patients including review of chart and various tests results, discussions about plan of care and coordination of care plan   Heath Lark, MD 08/08/2020 2:20 PM  INTERVAL HISTORY: Please see below for problem oriented charting. She returns for further follow-up with her daughter She felt that her abdominal pain has improved She developed severe constipation recently, resolved with laxatives No recent nausea No recent infection, fever or chills  SUMMARY OF ONCOLOGIC HISTORY: Oncology History Overview Note  Negative genetic testing Progressed on Niraparib and gemzar   Carcinomatosis ([place])  12/06/2015 Initial Diagnosis   Carcinomatosis ([place])   07/18/2020 -  Chemotherapy   The patient had palonosetron (ALOXI) injection 0.25 mg, 0.25 mg, Intravenous,  Once, 2 of 4 cycles Administration: 0.25 mg (07/18/2020), 0.25 mg (08/08/2020) CARBOplatin (PARAPLATIN) 500 mg in  sodium chloride 0.9 % 250 mL chemo infusion, 500 mg (100 % of original dose 498.5 mg), Intravenous,  Once, 2 of 4 cycles Dose modification:   (original dose 498.5 mg, Cycle 1) Administration: 500 mg (07/18/2020), 500 mg (08/08/2020) fosaprepitant (EMEND) 150 mg in sodium chloride 0.9 % 145 mL IVPB, 150 mg, Intravenous,  Once, 2 of 4 cycles Administration: 150 mg (07/18/2020), 150 mg (08/08/2020)  for chemotherapy treatment.    Ovarian cancer, bilateral (Brenda Cunningham)  12/24/2015 Initial Diagnosis   Ovarian cancer, bilateral (Brenda Cunningham)   07/18/2020 -  Chemotherapy   The patient had palonosetron (ALOXI) injection 0.25 mg, 0.25 mg, Intravenous,  Once, 2 of 4 cycles Administration: 0.25 mg (07/18/2020), 0.25 mg (08/08/2020) CARBOplatin (PARAPLATIN) 500 mg in sodium chloride 0.9 % 250 mL chemo infusion, 500 mg (100 % of original dose 498.5 mg), Intravenous,  Once, 2 of 4 cycles Dose modification:   (original dose 498.5 mg, Cycle 1) Administration: 500 mg (07/18/2020), 500 mg (08/08/2020) fosaprepitant (EMEND) 150 mg in sodium chloride 0.9 % 145 mL IVPB, 150 mg, Intravenous,  Once, 2 of 4 cycles Administration: 150 mg (07/18/2020), 150 mg (08/08/2020)  for chemotherapy treatment.    Ovarian cancer ([place])  11/28/2015 Imaging   Ct scan of abdomen: Peritoneal carcinomatosis and pelvic/inguinal adenopathy. Gynecologic primary is favored.   12/17/2015 Pathology Results   Lymph node, needle/core biopsy, L inguinal LAN - METASTATIC ADENOCARCINOMA. Microscopic Comment Immunohistochemistry will be performed and reported as an addendum. (JDP:ecj 12/18/2015) ADDENDUM: Immunohistochemistry shows strong positivity with cytokeratin 7, estrogen receptor (ER), progesterone receptor (PR), p53 and WT1. Negative markers are cytokeratin 20 , CDX- 2 and gross cystic disease fluid protein. The morphology and immunophenotype are most consistent with a high grade gynecologic  carcinoma including high grade ovarian serous carcinoma. (JDP:kh 12-19-15    12/17/2015 Procedure   Technically successful ultrasound guided core left inguinal adenopathy biopsy   12/24/2015 Tumor Marker   Patient's tumor was tested for the following markers: CA-125 Results of the tumor marker test revealed 608.2   12/27/2015 - 02/28/2016 Chemotherapy   She received neoadjuvant chemo x 3 cycles   01/01/2016 Tumor Marker   Patient's tumor was tested for the following markers: CA-125 Results of the tumor marker test revealed 741.6   01/29/2016 Procedure   Successful placement of a right internal jugular approach power injectable Port-A-Cath. The catheter is ready for immediate use   01/29/2016 Imaging   US abdomen 1. Hyperechoic 2.5 x 1.0 x 1.6 cm lesion in the region the caudate lobe of the liver. Similar finding noted on prior CT of 11/28/2015. This could represent hemangioma. This could represent a malignancy including metastatic disease. 2. Exam otherwise unremarkable. No gallstones. No biliary distention.   02/03/2016 Tumor Marker   Patient's tumor was tested for the following markers: CA-125 Results of the tumor marker test revealed 137.1   02/20/2016 Imaging   MRI brain No acute infarct.  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  No intracranial mass.  Moderate small vessel disease changes.  Global atrophy without hydrocephalus.  Expanded partially empty sella without secondary findings of pseudotumor cerebri.  Minimal mucosal thickening ethmoid sinus air cells. Small air-fluid levels maxillary sinuses bilaterally may indicate changes of acute sinusitis.    02/28/2016 Tumor Marker   Patient's tumor was tested for the following markers: CA-125 Results of the tumor marker test revealed 33.6   03/02/2016 Imaging   CT chest, abdomen and pelvis 1. Today's study demonstrates a positive response to therapy with resolution of the previously noted malignant ascites, significant regression of previously noted peritoneal  implants, and regression of previously noted lymphadenopathy in the abdomen and pelvis. 2. No definite evidence to suggest metastatic disease to the thorax. 3. Stable 1.0 x 1.7 cm intermediate attenuation lesion associated with the posterior aspect of segment 1 of the liver, favored to represent a mildly proteinaceous hepatic cyst. The possibility of a peritoneal implant in this region is not entirely excluded, but is not favored on today's examination. 4. Extensive post infectious scarring inguinal upper right lung, likely sequela of prior necrotizing pneumonia. 5. Multiple tiny pulmonary nodules scattered throughout the lungs bilaterally all measuring 4 mm or less. These are nonspecific, but favored to be benign. Given the patient's history of primary gynecologic malignancy, attention on followup studies is recommended to ensure the stability of these findings. 6. Additional incidental findings, as above   03/14/2016 Imaging   CT angiogram 1. No pulmonary emboli. 2. Chronic changes to the right upper lobe. 3. Stable lesion in the caudate lobe of the liver. 4. Stable soft tissue prominence to the right of the trachea as described above. 5. Stable small nodule in the right lung.    03/24/2016 Pathology Results   1. Omentum, resection for tumor - FOCAL ADENOCARCINOMA ASSOCIATED WITH EXTENSIVE FIBROSIS, INFLAMMATION AND HEMOSIDERIN DEPOSITION. 2. Uterus +/- tubes/ovaries, neoplastic, cervix - CERVIX: SLIGHT CERVICITIS AND SQUAMOUS METAPLASIA. - ENDOMETRIUM: ATROPHIC, NO HYPERPLASIA OR MALIGNANCY. - MYOMETRIUM: LEIOMYOMATA WITH DEGENERATIVE CHANGES. NO MALIGNANCY. - UTERINE SEROSA: FOCAL ADENOCARCINOMA ASSOCIATED WITH ADHESIONS. - RIGHT OVARY: BENIGN CALCIFIED NODULE AND BENIGN SEROUS CYST. NO MALIGNANCY IDENTIFIED. - RIGHT FALLOPIAN TUBE: UNREMARKABLE. NO MALIGNANCY IDENTIFIED. - LEFT OVARY: FOCAL ADENOCARCINOMA ASSOCIATED WITH EXTENSIVE FIBROSIS. - LEFT FALLOPIAN TUBE:  HYDROSALPINX. NO  MALIGNANCY IDENTIFIED. 3. Lymph node, biopsy, right external iliac - METASTATIC ADENOCARCINOMA, 4. Soft tissue, biopsy, left para colic gutter - FOCAL ADENOCARCINOMA ASSOCIATED WITH FIBROSIS. Microscopic Comment 2. OVARY Specimen(s): Uterus with bilateral ovaries, omentum, right external iliac lymph node and left paracolic gutter. Procedure: (including lymph node sampling) Hysterectomy with bilateral salpingo-oophorectomy, omentectomy, lymph node biopsy and paracolic gutter biopsy. Primary tumor site (including laterality): Left ovary. Ovarian surface involvement: Yes Ovarian capsule intact without fragmentation: N/A Maximum tumor size (cm): 2 cm, 2 cm Histologic type: Serous carcinoma. Grade: 3 Peritoneal implants: (specify invasive or non-invasive): Left paracolic gutter positive for carcinoma Pelvic extension (list additional structures on separate lines and if involved): Omentum, right paracolic gutter and uterine serosa. Lymph nodes: number examined 1 ; number positive 1 TNM code: ypT3a, ypN1b, ypMX FIGO Stage (based on pathologic findings, needs clinical correlation): IIIA2 Comments: The left ovary has a 2 cm nodule, which is composed primarily of fibrous tissue with small foci of residual adenocarcinoma. There is also microscopic involvement of the uterine serosa, the left paracolic gutter biopsy and the omentum. The left paracolic gutter and omental involvement is microscopic and both are associated with extensive fibrosis with focal hemosiderin deposition. The right external iliac node is completely replaced by metastatic adenocarcinoma with serous features. (JDP:ecj 03/30/2016)   03/24/2016 Surgery   Operation: Robotic-assisted laparoscopic total hysterectomy with bilateral salpingoophorectomy, omentectomy, radical tumor debulking  Surgeon: Donaciano Eva  Operative Findings:  : small nodules in omentum, no ascites. Omental nodule (2cm) adherent to lower anterior  abdominal wall. 2cm left colonic gutter cystic nodule. 6cm right external iliac lymph node. Grossly normal ovaries and tubes. Fibroid uterus. No gross residual disease at completion of surgery representing R0 optimal/complete resection.    04/17/2016 Imaging   CT abdomen and pelvis 1. There is no evidence of acute inflammatory process within abdomen or pelvis. 2. Stable low-density lesion within caudate lobe of the liver. No new hepatic lesions are noted. 3. Again noted lobulated renal contour and multifocal renal cortical scarring. No hydronephrosis or hydroureter. 4. No significant mesenteric adenopathy. No retroperitoneal adenopathy. Stable minimal residual peritoneal thickening within pelvis. No new pelvic implants or pelvic ascites. 5. Status post hysterectomy.  Unremarkable urinary bladder. 6. Moderate stool within colon as described above. No evidence of colitis.    04/24/2016 - 06/19/2016 Chemotherapy   She received 3 more cycles of chemo after surgery   05/14/2016 Tumor Marker   Patient's tumor was tested for the following markers: CA-125 Results of the tumor marker test revealed 22.5   06/04/2016 Tumor Marker   Patient's tumor was tested for the following markers: CA-125 Results of the tumor marker test revealed 13.7   07/07/2016 Genetic Testing   Patient has genetic testing done for breast/ovasrian cancer panel Results revealed patient has no actionable mutation   07/23/2016 Imaging   Ct abdomen and pelvis 1. Heterogeneously enhancing 4.9 x 3.2 x 4.4 cm mass along the right pelvic sidewall may represent locally recurrent disease or malignant lymphadenopathy. 2. No other signs of definite metastatic disease noted elsewhere in the abdomen or pelvis. 3. Stable low-attenuation hepatic lesion in the caudate lobe of the liver, which appears to demonstrates some progressive centripetal filling on delayed images. This lesion is presumably benign given its stability compared to prior  studies, and is favored to represent a small cavernous hemangioma. 4. Cardiomegaly with biatrial dilatation, which is very severe on the right side. 5. Aortic atherosclerosis. 6. Additional incidental findings, as  above.   07/23/2016 Tumor Marker   Patient's tumor was tested for the following markers: CA-125 Results of the tumor marker test revealed 12.3   09/30/2016 Imaging   Ct abdomen and pelvis: 1. 4.9 cm heterogeneously enhancing mass seen along the right pelvic sidewall previously has almost completely resolved. There is some ill-defined soft tissue attenuation/peritoneal thickening in this region today but no discrete measurable lesion is evident. 2. No new or progressive findings on today's exam. 3. Stable hypo attenuating lesion in the caudate lobe of the liver. 4. Subtle aortic atherosclerosis   09/30/2016 Tumor Marker   Patient's tumor was tested for the following markers: CA-125 Results of the tumor marker test revealed 9.6   10/05/2016 Pathology Results   Vagina, biopsy, left cuff - BENIGN FIBROEPITHELIAL (STROMAL) POLYP. - NO DYSPLASIA, ATYPIA OR MALIGNANCY IDENTIFIED.   01/14/2017 Tumor Marker   Patient's tumor was tested for the following markers: CA-125 Results of the tumor marker test revealed 11.9   05/05/2017 Tumor Marker   Patient's tumor was tested for the following markers: CA-125 Results of the tumor marker test revealed 28   06/14/2017 Tumor Marker   Patient's tumor was tested for the following markers: CA-125 Results of the tumor marker test revealed 45.7   06/24/2017 Imaging   Ct abdomen and pelvis: Increased peritoneal metastatic disease in abdomen and pelvis since prior exam. No evidence of ascites. Stable small benign hepatic hemangioma.   07/21/2017 Tumor Marker   Patient's tumor was tested for the following markers: CA-125 Results of the tumor marker test revealed 84.1   07/21/2017 - 11/03/2017 Chemotherapy   She received carboplatin and taxol    08/11/2017 Adverse Reaction   She had severe neuropathy due to treatment. Does of chemo is reduced starting cycle 2 onwards   09/02/2017 Tumor Marker   Patient's tumor was tested for the following markers: CA-125 Results of the tumor marker test revealed 27.7   09/16/2017 Imaging   CT CHEST IMPRESSION  1. No acute process or evidence of metastatic disease in the chest. 2. Right upper lung bronchiectasis, consolidation, and architectural distortion are most likely post infectious or inflammatory and not significantly changed. 3. Aortic Atherosclerosis (ICD10-I70.0).  CT ABDOMEN AND PELVIS IMPRESSION  1. Response to therapy of nodal and peritoneal metastasis. 2. No new or progressive disease. 3. Caudate lobe hemangioma, as before. 4. Bilateral renal cortical thinning. Similar minimal right-sided caliectasis and hydroureter. Likely secondary to low-grade obstruction by the dominant right pelvic implant.   09/22/2017 Tumor Marker   Patient's tumor was tested for the following markers: CA-125 Results of the tumor marker test revealed 18   11/03/2017 Tumor Marker   Patient's tumor was tested for the following markers: CA-125 Results of the tumor marker test revealed 13.4   11/25/2017 Imaging   1. Stable to slight interval decrease in size of nodal and peritoneal metastatic disease within the abdomen. 2. No new or progressive disease.   12/03/2017 - 09/11/2019 Chemotherapy   The patient is prescribed [place]   01/17/2018 Tumor Marker   Patient's tumor was tested for the following markers: CA-125 Results of the tumor marker test revealed 11.9   02/22/2018 Tumor Marker   Patient's tumor was tested for the following markers: CA-125 Results of the tumor marker test revealed 11.7   02/22/2018 Imaging   1. Improved nodular thickening along the vaginal cuff on the left and medial to the right iliac vessels, likely treated implants. No evidence of progressive peritoneal disease. 2. Stable  hepatic hemangioma. No evidence of solid visceral organ metastases. 3. Bilateral renal cortical scarring.   05/19/2018 Tumor Marker   Patient's tumor was tested for the following markers: CA-125 Results of the tumor marker test revealed 9.9   08/10/2018 Imaging   Stable mild nodular thickening of the left vaginal cuff. No new or progressive disease within the abdomen or pelvis.  Stable small hepatic hemangioma.   09/21/2018 Tumor Marker   Patient's tumor was tested for the following markers: CA-125 Results of the tumor marker test revealed 10.3   11/11/2018 Tumor Marker   Patient's tumor was tested for the following markers: CA-125 Results of the tumor marker test revealed 10.5   12/15/2018 Tumor Marker   Patient's tumor was tested for the following markers: CA-125 Results of the tumor marker test revealed 12.6   01/06/2019 Tumor Marker   Patient's tumor was tested for the following markers: CA-125 Results of the tumor marker test revealed 14.6   01/12/2019 Imaging   1. Chronic extensive right upper lobe scarring changes and bronchiectasis. 2. No mediastinal or hilar mass or adenopathy and no findings for pulmonary metastatic disease. 3. Stable caudate lobe hemangioma. 4. No worrisome omental or peritoneal surface lesions. 5. Stable pericecal and small pelvic lymph nodes. No new or progressive findings.    09/11/2019 Imaging   Ct abdomen and pelvis Pericecal peritoneal metastatic implants   09/19/2019 - 07/04/2020 Chemotherapy   The patient had carboplatin and gemxar for chemotherapy treatment, then on gemzar maintenance since 11/28/2019    11/20/2019 Imaging   1. Mild decrease in size of peritoneal tumor implant along the lateral aspect of the cecum. 2. Stable tiny sub-cm mesenteric soft tissue nodules or lymph nodes in right lower quadrant and anterior pelvic mesentery. 3. No new or progressive disease identified within the abdomen or pelvis.   03/12/2020 Imaging   1. No acute  findings within the abdomen or pelvis. 2. Stable appearance of right lower quadrant peritoneal implant adjacent to cecum. 3. Increase in size of right lower quadrant ileocolic lymph node. 1.2 cm on today's study versus 0.8 previously. 4. Possible early avascular necrosis involving the left femoral head.   Aortic Atherosclerosis (ICD10-I70.0).   07/04/2020 Tumor Marker   Patient's tumor was tested for the following markers: CA-125 Results of the tumor marker test revealed 73   07/09/2020 Imaging   1. Enlarging noncalcified implants in the ileocolonic mesentery, suspicious for metastatic disease in this patient with a rising serum CA 125 level. There is a questionable new peritoneal implant associated with the distal small bowel. No resulting bowel obstruction. 2. No evidence of visceral metastatic disease. 3. Aortic Atherosclerosis (ICD10-I70.0).   07/18/2020 -  Chemotherapy   The patient had palonosetron (ALOXI) injection 0.25 mg, 0.25 mg, Intravenous,  Once, 2 of 4 cycles Administration: 0.25 mg (07/18/2020), 0.25 mg (08/08/2020) CARBOplatin (PARAPLATIN) 500 mg in sodium chloride 0.9 % 250 mL chemo infusion, 500 mg (100 % of original dose 498.5 mg), Intravenous,  Once, 2 of 4 cycles Dose modification:   (original dose 498.5 mg, Cycle 1) Administration: 500 mg (07/18/2020), 500 mg (08/08/2020) fosaprepitant (EMEND) 150 mg in sodium chloride 0.9 % 145 mL IVPB, 150 mg, Intravenous,  Once, 2 of 4 cycles Administration: 150 mg (07/18/2020), 150 mg (08/08/2020)  for chemotherapy treatment.    07/18/2020 Tumor Marker   Patient's tumor was tested for the following markers: CA-125 Results of the tumor marker test revealed 123     REVIEW OF SYSTEMS:   Constitutional:  Denies fevers, chills or abnormal weight loss Eyes: Denies blurriness of vision Ears, nose, mouth, throat, and face: Denies mucositis or sore throat Respiratory: Denies cough, dyspnea or wheezes Cardiovascular: Denies palpitation, chest  discomfort or lower extremity swelling Skin: Denies abnormal skin rashes Lymphatics: Denies new lymphadenopathy or easy bruising Neurological:Denies numbness, tingling or new weaknesses Behavioral/Psych: Mood is stable, no new changes  All other systems were reviewed with the patient and are negative.  I have reviewed the past medical history, past surgical history, social history and family history with the patient and they are unchanged from previous note.  ALLERGIES:  is allergic to codeine, lactulose, and latex.  MEDICATIONS:  Current Outpatient Medications  Medication Sig Dispense Refill  . apixaban (ELIQUIS) 5 MG TABS tablet Take 1 tablet (5 mg total) by mouth 2 (two) times daily. 60 tablet 5  . atorvastatin (LIPITOR) 40 MG tablet Take 40 mg by mouth at bedtime.    . gabapentin (NEURONTIN) 600 MG tablet TAKE 1 TABLET(600 MG) BY MOUTH TWICE DAILY 60 tablet 11  . lidocaine-prilocaine (EMLA) cream Apply to Porta-Cath 1-2 hours prior to access as directed. 30 g 1  . metoprolol succinate (TOPROL-XL) 25 MG 24 hr tablet Take 25 mg by mouth daily.   5  . Multiple Vitamin (MULTIVITAMIN WITH MINERALS) TABS tablet Take 1 tablet by mouth daily.    Marland Kitchen omega-3 acid ethyl esters (LOVAZA) 1 g capsule Take 1 g by mouth daily.    . ondansetron (ZOFRAN ODT) 4 MG disintegrating tablet Take 1 tablet (4 mg total) by mouth every 8 (eight) hours as needed for nausea or vomiting. 60 tablet 9  . Oxycodone HCl 10 MG TABS Take 1 tablet (10 mg total) by mouth every 6 (six) hours as needed. 60 tablet 0  . PARoxetine (PAXIL) 40 MG tablet Take 40 mg by mouth daily.  0  . polyethylene glycol (MIRALAX / GLYCOLAX) packet Take 17 g by mouth daily.     No current facility-administered medications for this visit.   Facility-Administered Medications Ordered in Other Visits  Medication Dose Route Frequency Provider Last Rate Last Admin  . sodium chloride flush (NS) 0.9 % injection 10 mL  10 mL Intracatheter PRN Alvy Bimler,  Ni, MD   10 mL at 08/08/20 1342    PHYSICAL EXAMINATION: ECOG PERFORMANCE STATUS: 1 - Symptomatic but completely ambulatory  Vitals:   08/08/20 1044  BP: 118/74  Pulse: 72  Resp: 18  Temp: 97.9 F (36.6 C)  SpO2: 100%   Filed Weights   08/08/20 1044  Weight: 202 lb 12.8 oz (92 kg)    GENERAL:alert, no distress and comfortable SKIN: skin color, texture, turgor are normal, no rashes or significant lesions EYES: normal, Conjunctiva are pink and non-injected, sclera clear OROPHARYNX:no exudate, no erythema and lips, buccal mucosa, and tongue normal  NECK: supple, thyroid normal size, non-tender, without nodularity LYMPH:  no palpable lymphadenopathy in the cervical, axillary or inguinal LUNGS: clear to auscultation and percussion with normal breathing effort HEART: regular rate & rhythm and no murmurs and no lower extremity edema ABDOMEN:abdomen soft, non-tender and normal bowel sounds Musculoskeletal:no cyanosis of digits and no clubbing  NEURO: alert & oriented x 3 with fluent speech, no focal motor/sensory deficits  LABORATORY DATA:  I have reviewed the data as listed    Component Value Date/Time   NA 141 08/08/2020 1037   NA 142 12/08/2017 1051   K 4.0 08/08/2020 1037   K 4.4 12/08/2017 1051   CL  106 08/08/2020 1037   CL 105 06/14/2017 1430   CO2 30 08/08/2020 1037   CO2 28 12/08/2017 1051   GLUCOSE 109 (H) 08/08/2020 1037   GLUCOSE 97 12/08/2017 1051   BUN 12 08/08/2020 1037   BUN 16.2 12/08/2017 1051   CREATININE 0.72 08/08/2020 1037   CREATININE 0.72 07/18/2020 1141   CREATININE 0.9 12/08/2017 1051   CALCIUM 10.1 08/08/2020 1037   CALCIUM 9.5 12/08/2017 1051   PROT 6.9 08/08/2020 1037   PROT 6.9 12/08/2017 1051   ALBUMIN 3.1 (L) 08/08/2020 1037   ALBUMIN 3.6 12/08/2017 1051   AST 18 08/08/2020 1037   AST 26 07/18/2020 1141   AST 18 12/08/2017 1051   ALT 18 08/08/2020 1037   ALT 25 07/18/2020 1141   ALT 20 12/08/2017 1051   ALKPHOS 160 (H) 08/08/2020  1037   ALKPHOS 131 12/08/2017 1051   BILITOT 0.4 08/08/2020 1037   BILITOT 0.5 07/18/2020 1141   BILITOT 0.70 12/08/2017 1051   GFRNONAA >60 08/08/2020 1037   GFRNONAA >60 07/18/2020 1141   GFRAA >60 08/08/2020 1037   GFRAA >60 07/18/2020 1141    No results found for: SPEP, UPEP  Lab Results  Component Value Date   WBC 3.8 (L) 08/08/2020   NEUTROABS 1.8 08/08/2020   HGB 11.0 (L) 08/08/2020   HCT 33.3 (L) 08/08/2020   MCV 107.1 (H) 08/08/2020   PLT 156 08/08/2020      Chemistry      Component Value Date/Time   NA 141 08/08/2020 1037   NA 142 12/08/2017 1051   K 4.0 08/08/2020 1037   K 4.4 12/08/2017 1051   CL 106 08/08/2020 1037   CL 105 06/14/2017 1430   CO2 30 08/08/2020 1037   CO2 28 12/08/2017 1051   BUN 12 08/08/2020 1037   BUN 16.2 12/08/2017 1051   CREATININE 0.72 08/08/2020 1037   CREATININE 0.72 07/18/2020 1141   CREATININE 0.9 12/08/2017 1051      Component Value Date/Time   CALCIUM 10.1 08/08/2020 1037   CALCIUM 9.5 12/08/2017 1051   ALKPHOS 160 (H) 08/08/2020 1037   ALKPHOS 131 12/08/2017 1051   AST 18 08/08/2020 1037   AST 26 07/18/2020 1141   AST 18 12/08/2017 1051   ALT 18 08/08/2020 1037   ALT 25 07/18/2020 1141   ALT 20 12/08/2017 1051   BILITOT 0.4 08/08/2020 1037   BILITOT 0.5 07/18/2020 1141   BILITOT 0.70 12/08/2017 1051

## 2020-08-08 NOTE — Assessment & Plan Note (Signed)
We have had extensive discussions about management of constipation over the years I reinforce the importance of taking laxatives on a regular basis

## 2020-08-09 LAB — CA 125: Cancer Antigen (CA) 125: 231 U/mL — ABNORMAL HIGH (ref 0.0–38.1)

## 2020-08-30 ENCOUNTER — Encounter: Payer: Self-pay | Admitting: Hematology and Oncology

## 2020-08-30 ENCOUNTER — Inpatient Hospital Stay (HOSPITAL_BASED_OUTPATIENT_CLINIC_OR_DEPARTMENT_OTHER): Payer: Medicare (Managed Care) | Admitting: Hematology and Oncology

## 2020-08-30 ENCOUNTER — Inpatient Hospital Stay: Payer: Medicare (Managed Care)

## 2020-08-30 ENCOUNTER — Other Ambulatory Visit: Payer: Self-pay

## 2020-08-30 DIAGNOSIS — C562 Malignant neoplasm of left ovary: Secondary | ICD-10-CM

## 2020-08-30 DIAGNOSIS — Z299 Encounter for prophylactic measures, unspecified: Secondary | ICD-10-CM | POA: Diagnosis not present

## 2020-08-30 DIAGNOSIS — Z7189 Other specified counseling: Secondary | ICD-10-CM

## 2020-08-30 DIAGNOSIS — D638 Anemia in other chronic diseases classified elsewhere: Secondary | ICD-10-CM | POA: Diagnosis not present

## 2020-08-30 DIAGNOSIS — G8191 Hemiplegia, unspecified affecting right dominant side: Secondary | ICD-10-CM

## 2020-08-30 DIAGNOSIS — C563 Malignant neoplasm of bilateral ovaries: Secondary | ICD-10-CM

## 2020-08-30 DIAGNOSIS — Z95828 Presence of other vascular implants and grafts: Secondary | ICD-10-CM

## 2020-08-30 DIAGNOSIS — C800 Disseminated malignant neoplasm, unspecified: Secondary | ICD-10-CM

## 2020-08-30 DIAGNOSIS — Z5111 Encounter for antineoplastic chemotherapy: Secondary | ICD-10-CM | POA: Diagnosis not present

## 2020-08-30 DIAGNOSIS — Z23 Encounter for immunization: Secondary | ICD-10-CM

## 2020-08-30 LAB — CBC WITH DIFFERENTIAL/PLATELET
Abs Immature Granulocytes: 0.01 10*3/uL (ref 0.00–0.07)
Basophils Absolute: 0 10*3/uL (ref 0.0–0.1)
Basophils Relative: 1 %
Eosinophils Absolute: 0.1 10*3/uL (ref 0.0–0.5)
Eosinophils Relative: 1 %
HCT: 36 % (ref 36.0–46.0)
Hemoglobin: 11.8 g/dL — ABNORMAL LOW (ref 12.0–15.0)
Immature Granulocytes: 0 %
Lymphocytes Relative: 31 %
Lymphs Abs: 1.6 10*3/uL (ref 0.7–4.0)
MCH: 34.7 pg — ABNORMAL HIGH (ref 26.0–34.0)
MCHC: 32.8 g/dL (ref 30.0–36.0)
MCV: 105.9 fL — ABNORMAL HIGH (ref 80.0–100.0)
Monocytes Absolute: 0.6 10*3/uL (ref 0.1–1.0)
Monocytes Relative: 12 %
Neutro Abs: 2.8 10*3/uL (ref 1.7–7.7)
Neutrophils Relative %: 55 %
Platelets: 169 10*3/uL (ref 150–400)
RBC: 3.4 MIL/uL — ABNORMAL LOW (ref 3.87–5.11)
RDW: 16.7 % — ABNORMAL HIGH (ref 11.5–15.5)
WBC: 5.2 10*3/uL (ref 4.0–10.5)
nRBC: 0 % (ref 0.0–0.2)

## 2020-08-30 LAB — COMPREHENSIVE METABOLIC PANEL
ALT: 34 U/L (ref 0–44)
AST: 35 U/L (ref 15–41)
Albumin: 3 g/dL — ABNORMAL LOW (ref 3.5–5.0)
Alkaline Phosphatase: 209 U/L — ABNORMAL HIGH (ref 38–126)
Anion gap: 3 — ABNORMAL LOW (ref 5–15)
BUN: 11 mg/dL (ref 8–23)
CO2: 30 mmol/L (ref 22–32)
Calcium: 9.1 mg/dL (ref 8.9–10.3)
Chloride: 105 mmol/L (ref 98–111)
Creatinine, Ser: 0.72 mg/dL (ref 0.44–1.00)
GFR calc Af Amer: >60 mL/min (ref >60)
GFR calc non Af Amer: >60 mL/min (ref >60)
Glucose, Bld: 80 mg/dL (ref 70–99)
Potassium: 4.1 mmol/L (ref 3.5–5.1)
Sodium: 138 mmol/L (ref 135–145)
Total Bilirubin: 0.4 mg/dL (ref 0.3–1.2)
Total Protein: 7 g/dL (ref 6.5–8.1)

## 2020-08-30 MED ORDER — SODIUM CHLORIDE 0.9 % IV SOLN
10.0000 mg | Freq: Once | INTRAVENOUS | Status: AC
  Administered 2020-08-30: 10 mg via INTRAVENOUS
  Filled 2020-08-30: qty 10

## 2020-08-30 MED ORDER — PALONOSETRON HCL INJECTION 0.25 MG/5ML
INTRAVENOUS | Status: AC
  Filled 2020-08-30: qty 5

## 2020-08-30 MED ORDER — PALONOSETRON HCL INJECTION 0.25 MG/5ML
0.2500 mg | Freq: Once | INTRAVENOUS | Status: AC
  Administered 2020-08-30: 0.25 mg via INTRAVENOUS

## 2020-08-30 MED ORDER — SODIUM CHLORIDE 0.9 % IV SOLN
150.0000 mg | Freq: Once | INTRAVENOUS | Status: AC
  Administered 2020-08-30: 150 mg via INTRAVENOUS
  Filled 2020-08-30: qty 150

## 2020-08-30 MED ORDER — SODIUM CHLORIDE 0.9% FLUSH
10.0000 mL | Freq: Once | INTRAVENOUS | Status: AC
  Administered 2020-08-30: 10 mL
  Filled 2020-08-30: qty 10

## 2020-08-30 MED ORDER — FAMOTIDINE IN NACL 20-0.9 MG/50ML-% IV SOLN
INTRAVENOUS | Status: AC
  Filled 2020-08-30: qty 50

## 2020-08-30 MED ORDER — SODIUM CHLORIDE 0.9 % IV SOLN
Freq: Once | INTRAVENOUS | Status: AC
  Filled 2020-08-30: qty 250

## 2020-08-30 MED ORDER — FAMOTIDINE IN NACL 20-0.9 MG/50ML-% IV SOLN
20.0000 mg | Freq: Once | INTRAVENOUS | Status: AC
  Administered 2020-08-30: 20 mg via INTRAVENOUS

## 2020-08-30 MED ORDER — DIPHENHYDRAMINE HCL 25 MG PO CAPS
25.0000 mg | ORAL_CAPSULE | Freq: Once | ORAL | Status: AC
  Administered 2020-08-30: 25 mg via ORAL

## 2020-08-30 MED ORDER — SODIUM CHLORIDE 0.9 % IV SOLN
493.0000 mg | Freq: Once | INTRAVENOUS | Status: AC
  Administered 2020-08-30: 490 mg via INTRAVENOUS
  Filled 2020-08-30: qty 49

## 2020-08-30 MED ORDER — HEPARIN SOD (PORK) LOCK FLUSH 100 UNIT/ML IV SOLN
500.0000 [IU] | Freq: Once | INTRAVENOUS | Status: AC | PRN
  Administered 2020-08-30: 500 [IU]
  Filled 2020-08-30: qty 5

## 2020-08-30 MED ORDER — DIPHENHYDRAMINE HCL 25 MG PO CAPS
ORAL_CAPSULE | ORAL | Status: AC
  Filled 2020-08-30: qty 1

## 2020-08-30 MED ORDER — SODIUM CHLORIDE 0.9% FLUSH
10.0000 mL | INTRAVENOUS | Status: DC | PRN
  Administered 2020-08-30: 10 mL
  Filled 2020-08-30: qty 10

## 2020-08-30 NOTE — Assessment & Plan Note (Signed)
We discussed the importance of preventive care and reviewed the vaccination programs. She does not have any prior allergic reactions to Covid-19 vaccination. She agrees to proceed with booster dose of Covid-19 vaccination today and we will administer it today at the clinic.  

## 2020-08-30 NOTE — Progress Notes (Signed)
[facility] OFFICE PROGRESS NOTE  Patient Care Team: Helane Rima, MD as PCP - General (Family Medicine) Encarnacion Slates, MD as Referring Physician (Neurology) Cameron Sprang, MD as Consulting Physician (Neurology)  ASSESSMENT & PLAN:  Ovarian cancer [facility]) So far, she tolerated treatment very well without major side effects I will call her daughter next week for the results of tumor marker If her CA-125 continues to drift up, we will have to repeat CT imaging before her next cycle of treatment If her CA-125 start trending down, we can wait and do CT scan after fourth dose of chemotherapy  Anemia, chronic disease This is likely due to recent treatment. The patient denies recent history of bleeding such as epistaxis, hematuria or hematochezia. She is asymptomatic from the anemia. I will observe for now.  Right hemiparesis ([place]) She has expressive dysphagia from prior history of stroke I recommend her daughter to come with her for doctor's visit appointment in the future to facilitate accurate communication  Preventive measure We discussed the importance of preventive care and reviewed the vaccination programs. She does not have any prior allergic reactions to Covid-19 vaccination. She agrees to proceed with booster dose of Covid-19 vaccination today and we will administer it today at the clinic.    No orders of the defined types were placed in this encounter.   All questions were answered. The patient knows to call the clinic with any problems, questions or concerns. The total time spent in the appointment was 20 minutes encounter with patients including review of chart and various tests results, discussions about plan of care and coordination of care plan   Heath Lark, MD 08/30/2020 12:03 PM  INTERVAL HISTORY: Please see below for problem oriented charting. She returns with her daughter for further follow-up She tolerated recent chemotherapy well No nausea or  changes in bowel habits No recent infection, fever or chills Her pain is stable The patient denies any recent signs or symptoms of bleeding such as spontaneous epistaxis, hematuria or hematochezia.   SUMMARY OF ONCOLOGIC HISTORY: Oncology History Overview Note  Negative genetic testing Progressed on Niraparib and gemzar   Carcinomatosis ([place])  12/06/2015 Initial Diagnosis   Carcinomatosis ([place])   07/18/2020 -  Chemotherapy   The patient had palonosetron (ALOXI) injection 0.25 mg, 0.25 mg, Intravenous,  Once, 2 of 4 cycles Administration: 0.25 mg (07/18/2020), 0.25 mg (08/08/2020) CARBOplatin (PARAPLATIN) 500 mg in sodium chloride 0.9 % 250 mL chemo infusion, 500 mg (100 % of original dose 498.5 mg), Intravenous,  Once, 2 of 4 cycles Dose modification:   (original dose 498.5 mg, Cycle 1) Administration: 500 mg (07/18/2020), 500 mg (08/08/2020) fosaprepitant (EMEND) 150 mg in sodium chloride 0.9 % 145 mL IVPB, 150 mg, Intravenous,  Once, 2 of 4 cycles Administration: 150 mg (07/18/2020), 150 mg (08/08/2020)  for chemotherapy treatment.    Ovarian cancer, bilateral ([place])  12/24/2015 Initial Diagnosis   Ovarian cancer, bilateral ([place])   07/18/2020 -  Chemotherapy   The patient had palonosetron (ALOXI) injection 0.25 mg, 0.25 mg, Intravenous,  Once, 2 of 4 cycles Administration: 0.25 mg (07/18/2020), 0.25 mg (08/08/2020) CARBOplatin (PARAPLATIN) 500 mg in sodium chloride 0.9 % 250 mL chemo infusion, 500 mg (100 % of original dose 498.5 mg), Intravenous,  Once, 2 of 4 cycles Dose modification:   (original dose 498.5 mg, Cycle 1) Administration: 500 mg (07/18/2020), 500 mg (08/08/2020) fosaprepitant (EMEND) 150 mg in sodium chloride 0.9 % 145 mL IVPB, 150 mg, Intravenous,  Once, 2 of 4 cycles Administration: 150 mg (07/18/2020), 150 mg (08/08/2020)  for chemotherapy treatment.    Ovarian cancer ([place])  11/28/2015 Imaging   Ct scan of abdomen: Peritoneal carcinomatosis and pelvic/inguinal adenopathy.  Gynecologic primary is favored.   12/17/2015 Pathology Results   Lymph node, needle/core biopsy, L inguinal LAN - METASTATIC ADENOCARCINOMA. Microscopic Comment Immunohistochemistry will be performed and reported as an addendum. (JDP:ecj 12/18/2015) ADDENDUM: Immunohistochemistry shows strong positivity with cytokeratin 7, estrogen receptor (ER), progesterone receptor (PR), p53 and WT1. Negative markers are cytokeratin 20 , CDX- 2 and gross cystic disease fluid protein. 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 (JDP:kh 12-19-15   12/17/2015 Procedure   Technically successful ultrasound guided core left inguinal adenopathy biopsy   12/24/2015 Tumor Marker   Patient's tumor was tested for the following markers: CA-125 Results of the tumor marker test revealed 608.2   12/27/2015 - 02/28/2016 Chemotherapy   She received neoadjuvant chemo x 3 cycles   01/01/2016 Tumor Marker   Patient's tumor was tested for the following markers: CA-125 Results of the tumor marker test revealed 741.6   01/29/2016 Procedure   Successful placement of a right internal jugular approach power injectable Port-A-Cath. The catheter is ready for immediate use   01/29/2016 Imaging   US abdomen 1. Hyperechoic 2.5 x 1.0 x 1.6 cm lesion in the region the caudate lobe of the liver. Similar finding noted on prior CT of 11/28/2015. This could represent hemangioma. This could represent a malignancy including metastatic disease. 2. Exam otherwise unremarkable. No gallstones. No biliary distention.   02/03/2016 Tumor Marker   Patient's tumor was tested for the following markers: CA-125 Results of the tumor marker test revealed 137.1   02/20/2016 Imaging   MRI brain No acute infarct.  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  No intracranial mass.  Moderate small vessel disease changes.  Global  atrophy without hydrocephalus.  Expanded partially empty sella without secondary findings of pseudotumor cerebri.  Minimal mucosal thickening ethmoid sinus air cells. Small air-fluid levels maxillary sinuses bilaterally may indicate changes of acute sinusitis.    02/28/2016 Tumor Marker   Patient's tumor was tested for the following markers: CA-125 Results of the tumor marker test revealed 33.6   03/02/2016 Imaging   CT chest, abdomen and pelvis 1. Today's study demonstrates a positive response to therapy with resolution of the previously noted malignant ascites, significant regression of previously noted peritoneal implants, and regression of previously noted lymphadenopathy in the abdomen and pelvis. 2. No definite evidence to suggest metastatic disease to the thorax. 3. Stable 1.0 x 1.7 cm intermediate attenuation lesion associated with the posterior aspect of segment 1 of the liver, favored to represent a mildly proteinaceous hepatic cyst. The possibility of a peritoneal implant in this region is not entirely excluded, but is not favored on today's examination. 4. Extensive post infectious scarring inguinal upper right lung, likely sequela of prior necrotizing pneumonia. 5. Multiple tiny pulmonary nodules scattered throughout the lungs bilaterally all measuring 4 mm or less. These are nonspecific, but favored to be benign. Given the patient's history of primary gynecologic malignancy, attention on followup studies is recommended to ensure the stability of these findings. 6. Additional incidental findings, as above   03/14/2016 Imaging   CT angiogram 1. No pulmonary emboli. 2. Chronic changes to the right upper lobe. 3. Stable lesion in the caudate lobe of the liver. 4. Stable soft  tissue prominence to the right of the trachea as described above. 5. Stable small nodule in the right lung.    03/24/2016 Pathology Results   1. Omentum, resection for tumor - FOCAL ADENOCARCINOMA  ASSOCIATED WITH EXTENSIVE FIBROSIS, INFLAMMATION AND HEMOSIDERIN DEPOSITION. 2. Uterus +/- tubes/ovaries, neoplastic, cervix - CERVIX: SLIGHT CERVICITIS AND SQUAMOUS METAPLASIA. - ENDOMETRIUM: ATROPHIC, NO HYPERPLASIA OR MALIGNANCY. - MYOMETRIUM: LEIOMYOMATA WITH DEGENERATIVE CHANGES. NO MALIGNANCY. - UTERINE SEROSA: FOCAL ADENOCARCINOMA ASSOCIATED WITH ADHESIONS. - RIGHT OVARY: BENIGN CALCIFIED NODULE AND BENIGN SEROUS CYST. NO MALIGNANCY IDENTIFIED. - RIGHT FALLOPIAN TUBE: UNREMARKABLE. NO MALIGNANCY IDENTIFIED. - LEFT OVARY: FOCAL ADENOCARCINOMA ASSOCIATED WITH EXTENSIVE FIBROSIS. - LEFT FALLOPIAN TUBE: HYDROSALPINX. NO MALIGNANCY IDENTIFIED. 3. Lymph node, biopsy, right external iliac - METASTATIC ADENOCARCINOMA, 4. Soft tissue, biopsy, left para colic gutter - FOCAL ADENOCARCINOMA ASSOCIATED WITH FIBROSIS. Microscopic Comment 2. OVARY Specimen(s): Uterus with bilateral ovaries, omentum, right external iliac lymph node and left paracolic gutter. Procedure: (including lymph node sampling) Hysterectomy with bilateral salpingo-oophorectomy, omentectomy, lymph node biopsy and paracolic gutter biopsy. Primary tumor site (including laterality): Left ovary. Ovarian surface involvement: Yes Ovarian capsule intact without fragmentation: N/A Maximum tumor size (cm): 2 cm, 2 cm Histologic type: Serous carcinoma. Grade: 3 Peritoneal implants: (specify invasive or non-invasive): Left paracolic gutter positive for carcinoma Pelvic extension (list additional structures on separate lines and if involved): Omentum, right paracolic gutter and uterine serosa. Lymph nodes: number examined 1 ; number positive 1 TNM code: ypT3a, ypN1b, ypMX FIGO Stage (based on pathologic findings, needs clinical correlation): IIIA2 Comments: The left ovary has a 2 cm nodule, which is composed primarily of fibrous tissue with small foci of residual adenocarcinoma. There is also microscopic involvement of the uterine  serosa, the left paracolic gutter biopsy and the omentum. The left paracolic gutter and omental involvement is microscopic and both are associated with extensive fibrosis with focal hemosiderin deposition. The right external iliac node is completely replaced by metastatic adenocarcinoma with serous features. (JDP:ecj 03/30/2016)   03/24/2016 Surgery   Operation: Robotic-assisted laparoscopic total hysterectomy with bilateral salpingoophorectomy, omentectomy, radical tumor debulking  Surgeon: Donaciano Eva  Operative Findings:  : small nodules in omentum, no ascites. Omental nodule (2cm) adherent to lower anterior abdominal wall. 2cm left colonic gutter cystic nodule. 6cm right external iliac lymph node. Grossly normal ovaries and tubes. Fibroid uterus. No gross residual disease at completion of surgery representing R0 optimal/complete resection.    04/17/2016 Imaging   CT abdomen and pelvis 1. There is no evidence of acute inflammatory process within abdomen or pelvis. 2. Stable low-density lesion within caudate lobe of the liver. No new hepatic lesions are noted. 3. Again noted lobulated renal contour and multifocal renal cortical scarring. No hydronephrosis or hydroureter. 4. No significant mesenteric adenopathy. No retroperitoneal adenopathy. Stable minimal residual peritoneal thickening within pelvis. No new pelvic implants or pelvic ascites. 5. Status post hysterectomy.  Unremarkable urinary bladder. 6. Moderate stool within colon as described above. No evidence of colitis.    04/24/2016 - 06/19/2016 Chemotherapy   She received 3 more cycles of chemo after surgery   05/14/2016 Tumor Marker   Patient's tumor was tested for the following markers: CA-125 Results of the tumor marker test revealed 22.5   06/04/2016 Tumor Marker   Patient's tumor was tested for the following markers: CA-125 Results of the tumor marker test revealed 13.7   07/07/2016 Genetic Testing   Patient has  genetic testing done for breast/ovasrian cancer panel Results revealed patient has no actionable mutation  07/23/2016 Imaging   Ct abdomen and pelvis 1. Heterogeneously enhancing 4.9 x 3.2 x 4.4 cm mass along the right pelvic sidewall may represent locally recurrent disease or malignant lymphadenopathy. 2. No other signs of definite metastatic disease noted elsewhere in the abdomen or pelvis. 3. Stable low-attenuation hepatic lesion in the caudate lobe of the liver, which appears to demonstrates some progressive centripetal filling on delayed images. This lesion is presumably benign given its stability compared to prior studies, and is favored to represent a small cavernous hemangioma. 4. Cardiomegaly with biatrial dilatation, which is very severe on the right side. 5. Aortic atherosclerosis. 6. Additional incidental findings, as above.   07/23/2016 Tumor Marker   Patient's tumor was tested for the following markers: CA-125 Results of the tumor marker test revealed 12.3   09/30/2016 Imaging   Ct abdomen and pelvis: 1. 4.9 cm heterogeneously enhancing mass seen along the right pelvic sidewall previously has almost completely resolved. There is some ill-defined soft tissue attenuation/peritoneal thickening in this region today but no discrete measurable lesion is evident. 2. No new or progressive findings on today's exam. 3. Stable hypo attenuating lesion in the caudate lobe of the liver. 4. Subtle aortic atherosclerosis   09/30/2016 Tumor Marker   Patient's tumor was tested for the following markers: CA-125 Results of the tumor marker test revealed 9.6   10/05/2016 Pathology Results   Vagina, biopsy, left cuff - BENIGN FIBROEPITHELIAL (STROMAL) POLYP. - NO DYSPLASIA, ATYPIA OR MALIGNANCY IDENTIFIED.   01/14/2017 Tumor Marker   Patient's tumor was tested for the following markers: CA-125 Results of the tumor marker test revealed 11.9   05/05/2017 Tumor Marker   Patient's tumor was  tested for the following markers: CA-125 Results of the tumor marker test revealed 28   06/14/2017 Tumor Marker   Patient's tumor was tested for the following markers: CA-125 Results of the tumor marker test revealed 45.7   06/24/2017 Imaging   Ct abdomen and pelvis: Increased peritoneal metastatic disease in abdomen and pelvis since prior exam. No evidence of ascites. Stable small benign hepatic hemangioma.   07/21/2017 Tumor Marker   Patient's tumor was tested for the following markers: CA-125 Results of the tumor marker test revealed 84.1   07/21/2017 - 11/03/2017 Chemotherapy   She received carboplatin and taxol   08/11/2017 Adverse Reaction   She had severe neuropathy due to treatment. Does of chemo is reduced starting cycle 2 onwards   09/02/2017 Tumor Marker   Patient's tumor was tested for the following markers: CA-125 Results of the tumor marker test revealed 27.7   09/16/2017 Imaging   CT CHEST IMPRESSION  1. No acute process or evidence of metastatic disease in the chest. 2. Right upper lung bronchiectasis, consolidation, and architectural distortion are most likely post infectious or inflammatory and not significantly changed. 3. Aortic Atherosclerosis (ICD10-I70.0).  CT ABDOMEN AND PELVIS IMPRESSION  1. Response to therapy of nodal and peritoneal metastasis. 2. No new or progressive disease. 3. Caudate lobe hemangioma, as before. 4. Bilateral renal cortical thinning. Similar minimal right-sided caliectasis and hydroureter. Likely secondary to low-grade obstruction by the dominant right pelvic implant.   09/22/2017 Tumor Marker   Patient's tumor was tested for the following markers: CA-125 Results of the tumor marker test revealed 18   11/03/2017 Tumor Marker   Patient's tumor was tested for the following markers: CA-125 Results of the tumor marker test revealed 13.4   11/25/2017 Imaging   1. Stable to slight interval decrease in size of  nodal and peritoneal  metastatic disease within the abdomen. 2. No new or progressive disease.   12/03/2017 - 09/11/2019 Chemotherapy   The patient is prescribed [place]   01/17/2018 Tumor Marker   Patient's tumor was tested for the following markers: CA-125 Results of the tumor marker test revealed 11.9   02/22/2018 Tumor Marker   Patient's tumor was tested for the following markers: CA-125 Results of the tumor marker test revealed 11.7   02/22/2018 Imaging   1. Improved nodular thickening along the vaginal cuff on the left and medial to the right iliac vessels, likely treated implants. No evidence of progressive peritoneal disease. 2. Stable hepatic hemangioma. No evidence of solid visceral organ metastases. 3. Bilateral renal cortical scarring.   05/19/2018 Tumor Marker   Patient's tumor was tested for the following markers: CA-125 Results of the tumor marker test revealed 9.9   08/10/2018 Imaging   Stable mild nodular thickening of the left vaginal cuff. No new or progressive disease within the abdomen or pelvis.  Stable small hepatic hemangioma.   09/21/2018 Tumor Marker   Patient's tumor was tested for the following markers: CA-125 Results of the tumor marker test revealed 10.3   11/11/2018 Tumor Marker   Patient's tumor was tested for the following markers: CA-125 Results of the tumor marker test revealed 10.5   12/15/2018 Tumor Marker   Patient's tumor was tested for the following markers: CA-125 Results of the tumor marker test revealed 12.6   01/06/2019 Tumor Marker   Patient's tumor was tested for the following markers: CA-125 Results of the tumor marker test revealed 14.6   01/12/2019 Imaging   1. Chronic extensive right upper lobe scarring changes and bronchiectasis. 2. No mediastinal or hilar mass or adenopathy and no findings for pulmonary metastatic disease. 3. Stable caudate lobe hemangioma. 4. No worrisome omental or peritoneal surface lesions. 5. Stable pericecal and small pelvic  lymph nodes. No new or progressive findings.    09/11/2019 Imaging   Ct abdomen and pelvis Pericecal peritoneal metastatic implants   09/19/2019 - 07/04/2020 Chemotherapy   The patient had carboplatin and gemxar for chemotherapy treatment, then on gemzar maintenance since 11/28/2019    11/20/2019 Imaging   1. Mild decrease in size of peritoneal tumor implant along the lateral aspect of the cecum. 2. Stable tiny sub-cm mesenteric soft tissue nodules or lymph nodes in right lower quadrant and anterior pelvic mesentery. 3. No new or progressive disease identified within the abdomen or pelvis.   03/12/2020 Imaging   1. No acute findings within the abdomen or pelvis. 2. Stable appearance of right lower quadrant peritoneal implant adjacent to cecum. 3. Increase in size of right lower quadrant ileocolic lymph node. 1.2 cm on today's study versus 0.8 previously. 4. Possible early avascular necrosis involving the left femoral head.   Aortic Atherosclerosis (ICD10-I70.0).   07/04/2020 Tumor Marker   Patient's tumor was tested for the following markers: CA-125 Results of the tumor marker test revealed 73   07/09/2020 Imaging   1. Enlarging noncalcified implants in the ileocolonic mesentery, suspicious for metastatic disease in this patient with a rising serum CA 125 level. There is a questionable new peritoneal implant associated with the distal small bowel. No resulting bowel obstruction. 2. No evidence of visceral metastatic disease. 3. Aortic Atherosclerosis (ICD10-I70.0).   07/18/2020 -  Chemotherapy   The patient had palonosetron (ALOXI) injection 0.25 mg, 0.25 mg, Intravenous,  Once, 2 of 4 cycles Administration: 0.25 mg (07/18/2020), 0.25 mg (08/08/2020) CARBOplatin (PARAPLATIN)  500 mg in sodium chloride 0.9 % 250 mL chemo infusion, 500 mg (100 % of original dose 498.5 mg), Intravenous,  Once, 2 of 4 cycles Dose modification:   (original dose 498.5 mg, Cycle 1) Administration: 500 mg  (07/18/2020), 500 mg (08/08/2020) fosaprepitant (EMEND) 150 mg in sodium chloride 0.9 % 145 mL IVPB, 150 mg, Intravenous,  Once, 2 of 4 cycles Administration: 150 mg (07/18/2020), 150 mg (08/08/2020)  for chemotherapy treatment.    07/18/2020 Tumor Marker   Patient's tumor was tested for the following markers: CA-125 Results of the tumor marker test revealed 123   08/08/2020 Tumor Marker   Patient's tumor was tested for the following markers: CA-125 Results of the tumor marker test revealed 231     REVIEW OF SYSTEMS:   Constitutional: Denies fevers, chills or abnormal weight loss Eyes: Denies blurriness of vision Ears, nose, mouth, throat, and face: Denies mucositis or sore throat Respiratory: Denies cough, dyspnea or wheezes Cardiovascular: Denies palpitation, chest discomfort or lower extremity swelling Gastrointestinal:  Denies nausea, heartburn or change in bowel habits Skin: Denies abnormal skin rashes Lymphatics: Denies new lymphadenopathy or easy bruising Neurological:Denies numbness, tingling or new weaknesses Behavioral/Psych: Mood is stable, no new changes  All other systems were reviewed with the patient and are negative.  I have reviewed the past medical history, past surgical history, social history and family history with the patient and they are unchanged from previous note.  ALLERGIES:  is allergic to codeine, lactulose, and latex.  MEDICATIONS:  Current Outpatient Medications  Medication Sig Dispense Refill  . apixaban (ELIQUIS) 5 MG TABS tablet Take 1 tablet (5 mg total) by mouth 2 (two) times daily. 60 tablet 5  . atorvastatin (LIPITOR) 40 MG tablet Take 40 mg by mouth at bedtime.    . gabapentin (NEURONTIN) 600 MG tablet TAKE 1 TABLET(600 MG) BY MOUTH TWICE DAILY 60 tablet 11  . lidocaine-prilocaine (EMLA) cream Apply to Porta-Cath 1-2 hours prior to access as directed. 30 g 1  . metoprolol succinate (TOPROL-XL) 25 MG 24 hr tablet Take 25 mg by mouth daily.   5  .  Multiple Vitamin (MULTIVITAMIN WITH MINERALS) TABS tablet Take 1 tablet by mouth daily.    Marland Kitchen omega-3 acid ethyl esters (LOVAZA) 1 g capsule Take 1 g by mouth daily.    . ondansetron (ZOFRAN ODT) 4 MG disintegrating tablet Take 1 tablet (4 mg total) by mouth every 8 (eight) hours as needed for nausea or vomiting. 60 tablet 9  . Oxycodone HCl 10 MG TABS Take 1 tablet (10 mg total) by mouth every 6 (six) hours as needed. 60 tablet 0  . PARoxetine (PAXIL) 40 MG tablet Take 40 mg by mouth daily.  0  . polyethylene glycol (MIRALAX / GLYCOLAX) packet Take 17 g by mouth daily.     No current facility-administered medications for this visit.    PHYSICAL EXAMINATION: ECOG PERFORMANCE STATUS: 2 - Symptomatic, <50% confined to bed  Vitals:   08/30/20 1130  BP: 137/87  Pulse: 73  Resp: 18  Temp: (!) 97.2 F (36.2 C)  SpO2: 100%   Filed Weights   08/30/20 1130  Weight: 197 lb 9.6 oz (89.6 kg)    GENERAL:alert, no distress and comfortable SKIN: skin color, texture, turgor are normal, no rashes or significant lesions EYES: normal, Conjunctiva are pink and non-injected, sclera clear OROPHARYNX:no exudate, no erythema and lips, buccal mucosa, and tongue normal  NECK: supple, thyroid normal size, non-tender, without nodularity LYMPH:  no  palpable lymphadenopathy in the cervical, axillary or inguinal LUNGS: clear to auscultation and percussion with normal breathing effort HEART: regular rate & rhythm and no murmurs and no lower extremity edema ABDOMEN:abdomen soft, non-tender and normal bowel sounds Musculoskeletal:no cyanosis of digits and no clubbing  NEURO: alert & oriented x 3 with dysarthria, persistent neurological deficit, unchanged compared to prior visit  LABORATORY DATA:  I have reviewed the data as listed    Component Value Date/Time   NA 141 08/08/2020 1037   NA 142 12/08/2017 1051   K 4.0 08/08/2020 1037   K 4.4 12/08/2017 1051   CL 106 08/08/2020 1037   CL 105 06/14/2017  1430   CO2 30 08/08/2020 1037   CO2 28 12/08/2017 1051   GLUCOSE 109 (H) 08/08/2020 1037   GLUCOSE 97 12/08/2017 1051   BUN 12 08/08/2020 1037   BUN 16.2 12/08/2017 1051   CREATININE 0.72 08/08/2020 1037   CREATININE 0.72 07/18/2020 1141   CREATININE 0.9 12/08/2017 1051   CALCIUM 10.1 08/08/2020 1037   CALCIUM 9.5 12/08/2017 1051   PROT 6.9 08/08/2020 1037   PROT 6.9 12/08/2017 1051   ALBUMIN 3.1 (L) 08/08/2020 1037   ALBUMIN 3.6 12/08/2017 1051   AST 18 08/08/2020 1037   AST 26 07/18/2020 1141   AST 18 12/08/2017 1051   ALT 18 08/08/2020 1037   ALT 25 07/18/2020 1141   ALT 20 12/08/2017 1051   ALKPHOS 160 (H) 08/08/2020 1037   ALKPHOS 131 12/08/2017 1051   BILITOT 0.4 08/08/2020 1037   BILITOT 0.5 07/18/2020 1141   BILITOT 0.70 12/08/2017 1051   GFRNONAA >60 08/08/2020 1037   GFRNONAA >60 07/18/2020 1141   GFRAA >60 08/08/2020 1037   GFRAA >60 07/18/2020 1141    No results found for: SPEP, UPEP  Lab Results  Component Value Date   WBC 5.2 08/30/2020   NEUTROABS 2.8 08/30/2020   HGB 11.8 (L) 08/30/2020   HCT 36.0 08/30/2020   MCV 105.9 (H) 08/30/2020   PLT 169 08/30/2020      Chemistry      Component Value Date/Time   NA 141 08/08/2020 1037   NA 142 12/08/2017 1051   K 4.0 08/08/2020 1037   K 4.4 12/08/2017 1051   CL 106 08/08/2020 1037   CL 105 06/14/2017 1430   CO2 30 08/08/2020 1037   CO2 28 12/08/2017 1051   BUN 12 08/08/2020 1037   BUN 16.2 12/08/2017 1051   CREATININE 0.72 08/08/2020 1037   CREATININE 0.72 07/18/2020 1141   CREATININE 0.9 12/08/2017 1051      Component Value Date/Time   CALCIUM 10.1 08/08/2020 1037   CALCIUM 9.5 12/08/2017 1051   ALKPHOS 160 (H) 08/08/2020 1037   ALKPHOS 131 12/08/2017 1051   AST 18 08/08/2020 1037   AST 26 07/18/2020 1141   AST 18 12/08/2017 1051   ALT 18 08/08/2020 1037   ALT 25 07/18/2020 1141   ALT 20 12/08/2017 1051   BILITOT 0.4 08/08/2020 1037   BILITOT 0.5 07/18/2020 1141   BILITOT 0.70  12/08/2017 1051

## 2020-08-30 NOTE — Assessment & Plan Note (Signed)
So far, she tolerated treatment very well without major side effects I will call her daughter next week for the results of tumor marker If her CA-125 continues to drift up, we will have to repeat CT imaging before her next cycle of treatment If her CA-125 start trending down, we can wait and do CT scan after fourth dose of chemotherapy

## 2020-08-30 NOTE — Assessment & Plan Note (Signed)
This is likely due to recent treatment. The patient denies recent history of bleeding such as epistaxis, hematuria or hematochezia. She is asymptomatic from the anemia. I will observe for now.   

## 2020-08-30 NOTE — Patient Instructions (Signed)
[facility] Discharge Instructions for Patients Receiving Chemotherapy  Today you received the following chemotherapy agents: carboplatin.  To help prevent nausea and vomiting after your treatment, we encourage you to take your nausea medication as directed.   If you develop nausea and vomiting that is not controlled by your nausea medication, call the clinic.   BELOW ARE SYMPTOMS THAT SHOULD BE REPORTED IMMEDIATELY:  *FEVER GREATER THAN 100.5 F  *CHILLS WITH OR WITHOUT FEVER  NAUSEA AND VOMITING THAT IS NOT CONTROLLED WITH YOUR NAUSEA MEDICATION  *UNUSUAL SHORTNESS OF BREATH  *UNUSUAL BRUISING OR BLEEDING  TENDERNESS IN MOUTH AND THROAT WITH OR WITHOUT PRESENCE OF ULCERS  *URINARY PROBLEMS  *BOWEL PROBLEMS  UNUSUAL RASH Items with * indicate a potential emergency and should be followed up as soon as possible.  Feel free to call the clinic should you have any questions or concerns. The clinic phone number is [phone].  Please show the CHEMO ALERT CARD at check-in to the Emergency Department and triage nurse.   

## 2020-08-30 NOTE — Assessment & Plan Note (Signed)
She has expressive dysphagia from prior history of stroke I recommend her daughter to come with her for doctor's visit appointment in the future to facilitate accurate communication 

## 2020-08-31 LAB — CA 125: Cancer Antigen (CA) 125: 635 U/mL — ABNORMAL HIGH (ref 0.0–38.1)

## 2020-09-02 ENCOUNTER — Telehealth: Payer: Self-pay | Admitting: Oncology

## 2020-09-02 ENCOUNTER — Other Ambulatory Visit: Payer: Self-pay | Admitting: Hematology and Oncology

## 2020-09-02 DIAGNOSIS — C563 Malignant neoplasm of bilateral ovaries: Secondary | ICD-10-CM

## 2020-09-02 DIAGNOSIS — C562 Malignant neoplasm of left ovary: Secondary | ICD-10-CM

## 2020-09-02 NOTE — Telephone Encounter (Signed)
Called Kenney Houseman (daughter) and advised her of CA 125 results. Discussed that Dr. Alvy Bimler would like to have a CT scheduled for next week and gave her the number for central scheduling to call.  Also discussed that we will schedule an appointment with Dr. Alvy Bimler after the CT to review the results.

## 2020-09-02 NOTE — Telephone Encounter (Signed)
Left a message for [name] with appointment to review CT scan with Dr. Alvy Bimler on 09/13/20 at 12:00.  Requested a return call to confirm appointment.

## 2020-09-12 ENCOUNTER — Ambulatory Visit (HOSPITAL_COMMUNITY)
Admission: RE | Admit: 2020-09-12 | Discharge: 2020-09-12 | Disposition: A | Discharge status: Home / Self Care | Payer: Medicare (Managed Care) | Source: Ambulatory Visit | Attending: Hematology and Oncology | Admitting: Hematology and Oncology

## 2020-09-12 ENCOUNTER — Other Ambulatory Visit: Payer: Self-pay

## 2020-09-12 DIAGNOSIS — C562 Malignant neoplasm of left ovary: Secondary | ICD-10-CM | POA: Insufficient documentation

## 2020-09-12 DIAGNOSIS — C563 Malignant neoplasm of bilateral ovaries: Secondary | ICD-10-CM | POA: Diagnosis present

## 2020-09-12 MED ORDER — IOHEXOL 300 MG/ML  SOLN
100.0000 mL | Freq: Once | INTRAMUSCULAR | Status: AC | PRN
  Administered 2020-09-12: 100 mL via INTRAVENOUS

## 2020-09-13 ENCOUNTER — Other Ambulatory Visit: Payer: Self-pay

## 2020-09-13 ENCOUNTER — Encounter: Payer: Self-pay | Admitting: Hematology and Oncology

## 2020-09-13 ENCOUNTER — Inpatient Hospital Stay: Payer: Medicare (Managed Care) | Attending: Hematology and Oncology | Admitting: Hematology and Oncology

## 2020-09-13 DIAGNOSIS — G8191 Hemiplegia, unspecified affecting right dominant side: Secondary | ICD-10-CM

## 2020-09-13 DIAGNOSIS — Z79899 Other long term (current) drug therapy: Secondary | ICD-10-CM | POA: Diagnosis not present

## 2020-09-13 DIAGNOSIS — Z9221 Personal history of antineoplastic chemotherapy: Secondary | ICD-10-CM | POA: Insufficient documentation

## 2020-09-13 DIAGNOSIS — Z5111 Encounter for antineoplastic chemotherapy: Secondary | ICD-10-CM | POA: Insufficient documentation

## 2020-09-13 DIAGNOSIS — C561 Malignant neoplasm of right ovary: Secondary | ICD-10-CM | POA: Insufficient documentation

## 2020-09-13 DIAGNOSIS — Z23 Encounter for immunization: Secondary | ICD-10-CM | POA: Insufficient documentation

## 2020-09-13 DIAGNOSIS — R918 Other nonspecific abnormal finding of lung field: Secondary | ICD-10-CM | POA: Insufficient documentation

## 2020-09-13 DIAGNOSIS — C786 Secondary malignant neoplasm of retroperitoneum and peritoneum: Secondary | ICD-10-CM | POA: Diagnosis not present

## 2020-09-13 DIAGNOSIS — I700 Atherosclerosis of aorta: Secondary | ICD-10-CM | POA: Diagnosis not present

## 2020-09-13 DIAGNOSIS — Z7189 Other specified counseling: Secondary | ICD-10-CM | POA: Diagnosis not present

## 2020-09-13 DIAGNOSIS — I69351 Hemiplegia and hemiparesis following cerebral infarction affecting right dominant side: Secondary | ICD-10-CM | POA: Insufficient documentation

## 2020-09-13 DIAGNOSIS — G620 Drug-induced polyneuropathy: Secondary | ICD-10-CM | POA: Diagnosis not present

## 2020-09-13 DIAGNOSIS — Z7901 Long term (current) use of anticoagulants: Secondary | ICD-10-CM | POA: Diagnosis not present

## 2020-09-13 DIAGNOSIS — D61818 Other pancytopenia: Secondary | ICD-10-CM | POA: Diagnosis not present

## 2020-09-13 DIAGNOSIS — R109 Unspecified abdominal pain: Secondary | ICD-10-CM | POA: Diagnosis not present

## 2020-09-13 DIAGNOSIS — C562 Malignant neoplasm of left ovary: Secondary | ICD-10-CM | POA: Diagnosis not present

## 2020-09-13 NOTE — Assessment & Plan Note (Signed)
She has expressive dysphagia from prior history of stroke I recommend her daughter to come with her for doctor's visit appointment in the future to facilitate accurate communication 

## 2020-09-13 NOTE — Assessment & Plan Note (Addendum)
I have reviewed multiple imaging studies with the patient and family Unfortunately, she has signs of cancer progression Her tumor marker was elevated as well She is carboplatin refractory We discussed the risk, benefits, side effects of further palliative chemotherapy comparing treatment with liposomal doxorubicin, paclitaxel and topotecan Due to pre-existing severe peripheral neuropathy, I am not in favor of recommending paclitaxel Ultimately, the patient has made informed decision to proceed with single agent topotecan We discussed the risk, benefits, side effects of topotecan including risk of pancytopenia, infection, nausea, and others and she is in agreement to proceed She is aware that treatment goal is palliative  Given her age and prior history of pancytopenia, I plan to modify chemotherapy dose by reducing 20% dose as well as each cycle to be given on day 1 and 8 only, rest day 15 for cycle of every 21 days

## 2020-09-13 NOTE — Progress Notes (Signed)
DISCONTINUE OFF PATHWAY REGIMEN - Ovarian   [id number]:Carboplatin AUC=5 q21 Days:   A cycle is every 21 days:     Carboplatin   **Always confirm dose/schedule in your pharmacy ordering system**  REASON: Disease Progression PRIOR TREATMENT: Off Pathway: Carboplatin AUC=5 q21 Days TREATMENT RESPONSE: Progressive Disease (PD)  START ON PATHWAY REGIMEN - Ovarian     A cycle is every 28 days:     Topotecan   **Always confirm dose/schedule in your pharmacy ordering system**  Patient Characteristics: Recurrent or Progressive Disease, Fourth Line and Beyond, BRCA Mutation Absent BRCA Mutation Status: Absent Therapeutic Status: Recurrent or Progressive Disease Line of Therapy: Fourth Line and Beyond  Intent of Therapy: Non-Curative / Palliative Intent, Discussed with Patient

## 2020-09-13 NOTE — Assessment & Plan Note (Signed)
We have discussed goals of care many times in the past She is in agreement to continue treatment with palliative intent

## 2020-09-13 NOTE — Progress Notes (Signed)
[facility] OFFICE PROGRESS NOTE  Patient Care Team: Helane Rima, MD as PCP - General (Family Medicine) Lonia Mad, MD as Referring Physician (Neurology) Cameron Sprang, MD as Consulting Physician (Neurology)  ASSESSMENT & PLAN:  Ovarian cancer [facility] I have reviewed multiple imaging studies with the patient and family Unfortunately, she has signs of cancer progression Her tumor marker was elevated as well She is carboplatin refractory We discussed the risk, benefits, side effects of further palliative chemotherapy comparing treatment with liposomal doxorubicin, paclitaxel and topotecan Due to pre-existing severe peripheral neuropathy, I am not in favor of recommending paclitaxel Ultimately, the patient has made informed decision to proceed with single agent topotecan We discussed the risk, benefits, side effects of topotecan including risk of pancytopenia, infection, nausea, and others and she is in agreement to proceed She is aware that treatment goal is palliative  Given her age and prior history of pancytopenia, I plan to modify chemotherapy dose by reducing 20% dose as well as each cycle to be given on day 1 and 8 only, rest day 15 for cycle of every 21 days  Goals of care, counseling/discussion We have discussed goals of care many times in the past She is in agreement to continue treatment with palliative intent  Right hemiparesis ([place]) She has expressive dysphagia from prior history of stroke I recommend her daughter to come with her for doctor's visit appointment in the future to facilitate accurate communication   No orders of the defined types were placed in this encounter.   All questions were answered. The patient knows to call the clinic with any problems, questions or concerns. The total time spent in the appointment was 40 minutes encounter with patients including review of chart and various tests results, discussions about plan of care and  coordination of care plan   Heath Lark, MD 09/13/2020 1:09 PM  INTERVAL HISTORY: Please see below for problem oriented charting. She returns with her daughter and son for further follow-up She denies worsening abdominal pain, nausea or recent changes in bowel habits  SUMMARY OF ONCOLOGIC HISTORY: Oncology History Overview Note  Negative genetic testing Progressed on Niraparib, gemzar and carboplatin   Carcinomatosis ([place])  12/06/2015 Initial Diagnosis   Carcinomatosis ([place])   07/18/2020 - 08/30/2020 Chemotherapy   The patient had carboplatin for chemotherapy treatment.     09/20/2020 -  Chemotherapy   The patient had topotecan (HYCAMTIN) 6.8 mg in sodium chloride 0.9 % 100 mL chemo infusion, 3.2 mg/m2 = 6.8 mg (80 % of original dose 4 mg/m2), Intravenous,  Once, 0 of 4 cycles Dose modification: 3.2 mg/m2 (80 % of original dose 4 mg/m2, Cycle 1, Reason: Provider Judgment)  for chemotherapy treatment.    Ovarian cancer, bilateral  12/24/2015 Initial Diagnosis   Ovarian cancer, bilateral ([place])   07/18/2020 - 08/30/2020 Chemotherapy   The patient had carboplatin for chemotherapy treatment.     09/20/2020 -  Chemotherapy   The patient had topotecan (HYCAMTIN) 6.8 mg in sodium chloride 0.9 % 100 mL chemo infusion, 3.2 mg/m2 = 6.8 mg (80 % of original dose 4 mg/m2), Intravenous,  Once, 0 of 4 cycles Dose modification: 3.2 mg/m2 (80 % of original dose 4 mg/m2, Cycle 1, Reason: Provider Judgment)  for chemotherapy treatment.    Ovarian cancer ([place])  11/28/2015 Imaging   Ct scan of abdomen: Peritoneal carcinomatosis and pelvic/inguinal adenopathy. Gynecologic primary is favored.   12/17/2015 Pathology Results   Lymph node, needle/core biopsy, L inguinal LAN -  METASTATIC ADENOCARCINOMA. Microscopic Comment Immunohistochemistry will be performed and reported as an addendum. (JDP:ecj 12/18/2015) ADDENDUM: Immunohistochemistry shows strong positivity with cytokeratin 7, estrogen receptor  (ER), progesterone receptor (PR), p53 and WT1. Negative markers are cytokeratin 20 , CDX- 2 and gross cystic disease fluid protein. 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 (JDP:kh 12-19-15   12/17/2015 Procedure   Technically successful ultrasound guided core left inguinal adenopathy biopsy   12/24/2015 Tumor Marker   Patient's tumor was tested for the following markers: CA-125 Results of the tumor marker test revealed 608.2   12/27/2015 - 02/28/2016 Chemotherapy   She received neoadjuvant chemo x 3 cycles   01/01/2016 Tumor Marker   Patient's tumor was tested for the following markers: CA-125 Results of the tumor marker test revealed 741.6   01/29/2016 Procedure   Successful placement of a right internal jugular approach power injectable Port-A-Cath. The catheter is ready for immediate use   01/29/2016 Imaging   US abdomen 1. Hyperechoic 2.5 x 1.0 x 1.6 cm lesion in the region the caudate lobe of the liver. Similar finding noted on prior CT of 11/28/2015. This could represent hemangioma. This could represent a malignancy including metastatic disease. 2. Exam otherwise unremarkable. No gallstones. No biliary distention.   02/03/2016 Tumor Marker   Patient's tumor was tested for the following markers: CA-125 Results of the tumor marker test revealed 137.1   02/20/2016 Imaging   MRI brain No acute infarct.  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  No intracranial mass.  Moderate small vessel disease changes.  Global atrophy without hydrocephalus.  Expanded partially empty sella without secondary findings of pseudotumor cerebri.  Minimal mucosal thickening ethmoid sinus air cells. Small air-fluid levels maxillary sinuses bilaterally may indicate changes of acute sinusitis.    02/28/2016 Tumor Marker   Patient's tumor was tested for the following markers:  CA-125 Results of the tumor marker test revealed 33.6   03/02/2016 Imaging   CT chest, abdomen and pelvis 1. Today's study demonstrates a positive response to therapy with resolution of the previously noted malignant ascites, significant regression of previously noted peritoneal implants, and regression of previously noted lymphadenopathy in the abdomen and pelvis. 2. No definite evidence to suggest metastatic disease to the thorax. 3. Stable 1.0 x 1.7 cm intermediate attenuation lesion associated with the posterior aspect of segment 1 of the liver, favored to represent a mildly proteinaceous hepatic cyst. The possibility of a peritoneal implant in this region is not entirely excluded, but is not favored on today's examination. 4. Extensive post infectious scarring inguinal upper right lung, likely sequela of prior necrotizing pneumonia. 5. Multiple tiny pulmonary nodules scattered throughout the lungs bilaterally all measuring 4 mm or less. These are nonspecific, but favored to be benign. Given the patient's history of primary gynecologic malignancy, attention on followup studies is recommended to ensure the stability of these findings. 6. Additional incidental findings, as above   03/14/2016 Imaging   CT angiogram 1. No pulmonary emboli. 2. Chronic changes to the right upper lobe. 3. Stable lesion in the caudate lobe of the liver. 4. Stable soft tissue prominence to the right of the trachea as described above. 5. Stable small nodule in the right lung.    03/24/2016 Pathology Results   1. Omentum, resection for tumor - FOCAL ADENOCARCINOMA ASSOCIATED WITH EXTENSIVE FIBROSIS, INFLAMMATION AND HEMOSIDERIN DEPOSITION. 2. Uterus +/- tubes/ovaries, neoplastic, cervix - CERVIX: SLIGHT CERVICITIS AND SQUAMOUS  METAPLASIA. - ENDOMETRIUM: ATROPHIC, NO HYPERPLASIA OR MALIGNANCY. - MYOMETRIUM: LEIOMYOMATA WITH DEGENERATIVE CHANGES. NO MALIGNANCY. - UTERINE SEROSA: FOCAL ADENOCARCINOMA ASSOCIATED WITH  ADHESIONS. - RIGHT OVARY: BENIGN CALCIFIED NODULE AND BENIGN SEROUS CYST. NO MALIGNANCY IDENTIFIED. - RIGHT FALLOPIAN TUBE: UNREMARKABLE. NO MALIGNANCY IDENTIFIED. - LEFT OVARY: FOCAL ADENOCARCINOMA ASSOCIATED WITH EXTENSIVE FIBROSIS. - LEFT FALLOPIAN TUBE: HYDROSALPINX. NO MALIGNANCY IDENTIFIED. 3. Lymph node, biopsy, right external iliac - METASTATIC ADENOCARCINOMA, 4. Soft tissue, biopsy, left para colic gutter - FOCAL ADENOCARCINOMA ASSOCIATED WITH FIBROSIS. Microscopic Comment 2. OVARY Specimen(s): Uterus with bilateral ovaries, omentum, right external iliac lymph node and left paracolic gutter. Procedure: (including lymph node sampling) Hysterectomy with bilateral salpingo-oophorectomy, omentectomy, lymph node biopsy and paracolic gutter biopsy. Primary tumor site (including laterality): Left ovary. Ovarian surface involvement: Yes Ovarian capsule intact without fragmentation: N/A Maximum tumor size (cm): 2 cm, 2 cm Histologic type: Serous carcinoma. Grade: 3 Peritoneal implants: (specify invasive or non-invasive): Left paracolic gutter positive for carcinoma Pelvic extension (list additional structures on separate lines and if involved): Omentum, right paracolic gutter and uterine serosa. Lymph nodes: number examined 1 ; number positive 1 TNM code: ypT3a, ypN1b, ypMX FIGO Stage (based on pathologic findings, needs clinical correlation): IIIA2 Comments: The left ovary has a 2 cm nodule, which is composed primarily of fibrous tissue with small foci of residual adenocarcinoma. There is also microscopic involvement of the uterine serosa, the left paracolic gutter biopsy and the omentum. The left paracolic gutter and omental involvement is microscopic and both are associated with extensive fibrosis with focal hemosiderin deposition. The right external iliac node is completely replaced by metastatic adenocarcinoma with serous features. (JDP:ecj 03/30/2016)   03/24/2016 Surgery   Operation:  Robotic-assisted laparoscopic total hysterectomy with bilateral salpingoophorectomy, omentectomy, radical tumor debulking  Surgeon: Donaciano Eva  Operative Findings:  : small nodules in omentum, no ascites. Omental nodule (2cm) adherent to lower anterior abdominal wall. 2cm left colonic gutter cystic nodule. 6cm right external iliac lymph node. Grossly normal ovaries and tubes. Fibroid uterus. No gross residual disease at completion of surgery representing R0 optimal/complete resection.    04/17/2016 Imaging   CT abdomen and pelvis 1. There is no evidence of acute inflammatory process within abdomen or pelvis. 2. Stable low-density lesion within caudate lobe of the liver. No new hepatic lesions are noted. 3. Again noted lobulated renal contour and multifocal renal cortical scarring. No hydronephrosis or hydroureter. 4. No significant mesenteric adenopathy. No retroperitoneal adenopathy. Stable minimal residual peritoneal thickening within pelvis. No new pelvic implants or pelvic ascites. 5. Status post hysterectomy.  Unremarkable urinary bladder. 6. Moderate stool within colon as described above. No evidence of colitis.    04/24/2016 - 06/19/2016 Chemotherapy   She received 3 more cycles of chemo after surgery   05/14/2016 Tumor Marker   Patient's tumor was tested for the following markers: CA-125 Results of the tumor marker test revealed 22.5   06/04/2016 Tumor Marker   Patient's tumor was tested for the following markers: CA-125 Results of the tumor marker test revealed 13.7   07/07/2016 Genetic Testing   Patient has genetic testing done for breast/ovasrian cancer panel Results revealed patient has no actionable mutation   07/23/2016 Imaging   Ct abdomen and pelvis 1. Heterogeneously enhancing 4.9 x 3.2 x 4.4 cm mass along the right pelvic sidewall may represent locally recurrent disease or malignant lymphadenopathy. 2. No other signs of definite metastatic disease noted  elsewhere in the abdomen or pelvis. 3. Stable low-attenuation hepatic lesion in the  caudate lobe of the liver, which appears to demonstrates some progressive centripetal filling on delayed images. This lesion is presumably benign given its stability compared to prior studies, and is favored to represent a small cavernous hemangioma. 4. Cardiomegaly with biatrial dilatation, which is very severe on the right side. 5. Aortic atherosclerosis. 6. Additional incidental findings, as above.   07/23/2016 Tumor Marker   Patient's tumor was tested for the following markers: CA-125 Results of the tumor marker test revealed 12.3   09/30/2016 Imaging   Ct abdomen and pelvis: 1. 4.9 cm heterogeneously enhancing mass seen along the right pelvic sidewall previously has almost completely resolved. There is some ill-defined soft tissue attenuation/peritoneal thickening in this region today but no discrete measurable lesion is evident. 2. No new or progressive findings on today's exam. 3. Stable hypo attenuating lesion in the caudate lobe of the liver. 4. Subtle aortic atherosclerosis   09/30/2016 Tumor Marker   Patient's tumor was tested for the following markers: CA-125 Results of the tumor marker test revealed 9.6   10/05/2016 Pathology Results   Vagina, biopsy, left cuff - BENIGN FIBROEPITHELIAL (STROMAL) POLYP. - NO DYSPLASIA, ATYPIA OR MALIGNANCY IDENTIFIED.   01/14/2017 Tumor Marker   Patient's tumor was tested for the following markers: CA-125 Results of the tumor marker test revealed 11.9   05/05/2017 Tumor Marker   Patient's tumor was tested for the following markers: CA-125 Results of the tumor marker test revealed 28   06/14/2017 Tumor Marker   Patient's tumor was tested for the following markers: CA-125 Results of the tumor marker test revealed 45.7   06/24/2017 Imaging   Ct abdomen and pelvis: Increased peritoneal metastatic disease in abdomen and pelvis since prior exam. No evidence of  ascites. Stable small benign hepatic hemangioma.   07/21/2017 Tumor Marker   Patient's tumor was tested for the following markers: CA-125 Results of the tumor marker test revealed 84.1   07/21/2017 - 11/03/2017 Chemotherapy   She received carboplatin and taxol   08/11/2017 Adverse Reaction   She had severe neuropathy due to treatment. Does of chemo is reduced starting cycle 2 onwards   09/02/2017 Tumor Marker   Patient's tumor was tested for the following markers: CA-125 Results of the tumor marker test revealed 27.7   09/16/2017 Imaging   CT CHEST IMPRESSION  1. No acute process or evidence of metastatic disease in the chest. 2. Right upper lung bronchiectasis, consolidation, and architectural distortion are most likely post infectious or inflammatory and not significantly changed. 3. Aortic Atherosclerosis (ICD10-I70.0).  CT ABDOMEN AND PELVIS IMPRESSION  1. Response to therapy of nodal and peritoneal metastasis. 2. No new or progressive disease. 3. Caudate lobe hemangioma, as before. 4. Bilateral renal cortical thinning. Similar minimal right-sided caliectasis and hydroureter. Likely secondary to low-grade obstruction by the dominant right pelvic implant.   09/22/2017 Tumor Marker   Patient's tumor was tested for the following markers: CA-125 Results of the tumor marker test revealed 18   11/03/2017 Tumor Marker   Patient's tumor was tested for the following markers: CA-125 Results of the tumor marker test revealed 13.4   11/25/2017 Imaging   1. Stable to slight interval decrease in size of nodal and peritoneal metastatic disease within the abdomen. 2. No new or progressive disease.   12/03/2017 - 09/11/2019 Chemotherapy   The patient is prescribed [place]   01/17/2018 Tumor Marker   Patient's tumor was tested for the following markers: CA-125 Results of the tumor marker test revealed 11.9  02/22/2018 Tumor Marker   Patient's tumor was tested for the following markers:  CA-125 Results of the tumor marker test revealed 11.7   02/22/2018 Imaging   1. Improved nodular thickening along the vaginal cuff on the left and medial to the right iliac vessels, likely treated implants. No evidence of progressive peritoneal disease. 2. Stable hepatic hemangioma. No evidence of solid visceral organ metastases. 3. Bilateral renal cortical scarring.   05/19/2018 Tumor Marker   Patient's tumor was tested for the following markers: CA-125 Results of the tumor marker test revealed 9.9   08/10/2018 Imaging   Stable mild nodular thickening of the left vaginal cuff. No new or progressive disease within the abdomen or pelvis.  Stable small hepatic hemangioma.   09/21/2018 Tumor Marker   Patient's tumor was tested for the following markers: CA-125 Results of the tumor marker test revealed 10.3   11/11/2018 Tumor Marker   Patient's tumor was tested for the following markers: CA-125 Results of the tumor marker test revealed 10.5   12/15/2018 Tumor Marker   Patient's tumor was tested for the following markers: CA-125 Results of the tumor marker test revealed 12.6   01/06/2019 Tumor Marker   Patient's tumor was tested for the following markers: CA-125 Results of the tumor marker test revealed 14.6   01/12/2019 Imaging   1. Chronic extensive right upper lobe scarring changes and bronchiectasis. 2. No mediastinal or hilar mass or adenopathy and no findings for pulmonary metastatic disease. 3. Stable caudate lobe hemangioma. 4. No worrisome omental or peritoneal surface lesions. 5. Stable pericecal and small pelvic lymph nodes. No new or progressive findings.    09/11/2019 Imaging   Ct abdomen and pelvis Pericecal peritoneal metastatic implants   09/19/2019 - 07/04/2020 Chemotherapy   The patient had carboplatin and gemxar for chemotherapy treatment, then on gemzar maintenance since 11/28/2019    11/20/2019 Imaging   1. Mild decrease in size of peritoneal tumor implant along  the lateral aspect of the cecum. 2. Stable tiny sub-cm mesenteric soft tissue nodules or lymph nodes in right lower quadrant and anterior pelvic mesentery. 3. No new or progressive disease identified within the abdomen or pelvis.   03/12/2020 Imaging   1. No acute findings within the abdomen or pelvis. 2. Stable appearance of right lower quadrant peritoneal implant adjacent to cecum. 3. Increase in size of right lower quadrant ileocolic lymph node. 1.2 cm on today's study versus 0.8 previously. 4. Possible early avascular necrosis involving the left femoral head.   Aortic Atherosclerosis (ICD10-I70.0).   07/04/2020 Tumor Marker   Patient's tumor was tested for the following markers: CA-125 Results of the tumor marker test revealed 73   07/09/2020 Imaging   1. Enlarging noncalcified implants in the ileocolonic mesentery, suspicious for metastatic disease in this patient with a rising serum CA 125 level. There is a questionable new peritoneal implant associated with the distal small bowel. No resulting bowel obstruction. 2. No evidence of visceral metastatic disease. 3. Aortic Atherosclerosis (ICD10-I70.0).   07/18/2020 - 08/30/2020 Chemotherapy   The patient had carboplatin for chemotherapy treatment.     07/18/2020 Tumor Marker   Patient's tumor was tested for the following markers: CA-125 Results of the tumor marker test revealed 123   08/08/2020 Tumor Marker   Patient's tumor was tested for the following markers: CA-125 Results of the tumor marker test revealed 231   08/30/2020 Tumor Marker   Patient's tumor was tested for the following markers: CA-125 Results of the tumor marker test  revealed 635   09/12/2020 Imaging   Mild increase in size of peritoneal metastasis in the right pelvis. Other small peritoneal nodules in right lower quadrant mesentery remain stable.    Stable soft tissue prominence involving the left vaginal cuff, suspicious for tumor.   No evidence of metastatic  disease within the chest.     09/20/2020 -  Chemotherapy   The patient had topotecan (HYCAMTIN) 6.8 mg in sodium chloride 0.9 % 100 mL chemo infusion, 3.2 mg/m2 = 6.8 mg (80 % of original dose 4 mg/m2), Intravenous,  Once, 0 of 4 cycles Dose modification: 3.2 mg/m2 (80 % of original dose 4 mg/m2, Cycle 1, Reason: Provider Judgment)  for chemotherapy treatment.      REVIEW OF SYSTEMS:   Constitutional: Denies fevers, chills or abnormal weight loss Eyes: Denies blurriness of vision Ears, nose, mouth, throat, and face: Denies mucositis or sore throat Respiratory: Denies cough, dyspnea or wheezes Cardiovascular: Denies palpitation, chest discomfort or lower extremity swelling Gastrointestinal:  Denies nausea, heartburn or change in bowel habits Skin: Denies abnormal skin rashes Lymphatics: Denies new lymphadenopathy or easy bruising Neurological:Denies numbness, tingling or new weaknesses Behavioral/Psych: Mood is stable, no new changes  All other systems were reviewed with the patient and are negative.  I have reviewed the past medical history, past surgical history, social history and family history with the patient and they are unchanged from previous note.  ALLERGIES:  is allergic to codeine, lactulose, and latex.  MEDICATIONS:  Current Outpatient Medications  Medication Sig Dispense Refill  . apixaban (ELIQUIS) 5 MG TABS tablet Take 1 tablet (5 mg total) by mouth 2 (two) times daily. 60 tablet 5  . atorvastatin (LIPITOR) 40 MG tablet Take 40 mg by mouth at bedtime.    . gabapentin (NEURONTIN) 600 MG tablet TAKE 1 TABLET(600 MG) BY MOUTH TWICE DAILY 60 tablet 11  . lidocaine-prilocaine (EMLA) cream Apply to Porta-Cath 1-2 hours prior to access as directed. 30 g 1  . metoprolol succinate (TOPROL-XL) 25 MG 24 hr tablet Take 25 mg by mouth daily.   5  . Multiple Vitamin (MULTIVITAMIN WITH MINERALS) TABS tablet Take 1 tablet by mouth daily.    Marland Kitchen omega-3 acid ethyl esters (LOVAZA) 1 g  capsule Take 1 g by mouth daily.    . ondansetron (ZOFRAN ODT) 4 MG disintegrating tablet Take 1 tablet (4 mg total) by mouth every 8 (eight) hours as needed for nausea or vomiting. 60 tablet 9  . Oxycodone HCl 10 MG TABS Take 1 tablet (10 mg total) by mouth every 6 (six) hours as needed. 60 tablet 0  . PARoxetine (PAXIL) 40 MG tablet Take 40 mg by mouth daily.  0  . polyethylene glycol (MIRALAX / GLYCOLAX) packet Take 17 g by mouth daily.     No current facility-administered medications for this visit.    PHYSICAL EXAMINATION: ECOG PERFORMANCE STATUS: 2 - Symptomatic, <50% confined to bed  Vitals:   09/13/20 1205  BP: 139/72  Pulse: 91  Resp: 18  Temp: 97.9 F (36.6 C)  SpO2: 100%   Filed Weights   09/13/20 1205  Weight: 202 lb 3.2 oz (91.7 kg)    GENERAL:alert, no distress and comfortable NEURO: alert & oriented with expressive dysphasia  LABORATORY DATA:  I have reviewed the data as listed    Component Value Date/Time   NA 138 08/30/2020 1105   NA 142 12/08/2017 1051   K 4.1 08/30/2020 1105   K 4.4 12/08/2017 1051  CL 105 08/30/2020 1105   CL 105 06/14/2017 1430   CO2 30 08/30/2020 1105   CO2 28 12/08/2017 1051   GLUCOSE 80 08/30/2020 1105   GLUCOSE 97 12/08/2017 1051   BUN 11 08/30/2020 1105   BUN 16.2 12/08/2017 1051   CREATININE 0.72 08/30/2020 1105   CREATININE 0.72 07/18/2020 1141   CREATININE 0.9 12/08/2017 1051   CALCIUM 9.1 08/30/2020 1105   CALCIUM 9.5 12/08/2017 1051   PROT 7.0 08/30/2020 1105   PROT 6.9 12/08/2017 1051   ALBUMIN 3.0 (L) 08/30/2020 1105   ALBUMIN 3.6 12/08/2017 1051   AST 35 08/30/2020 1105   AST 26 07/18/2020 1141   AST 18 12/08/2017 1051   ALT 34 08/30/2020 1105   ALT 25 07/18/2020 1141   ALT 20 12/08/2017 1051   ALKPHOS 209 (H) 08/30/2020 1105   ALKPHOS 131 12/08/2017 1051   BILITOT 0.4 08/30/2020 1105   BILITOT 0.5 07/18/2020 1141   BILITOT 0.70 12/08/2017 1051   GFRNONAA >60 08/30/2020 1105   GFRNONAA >60 07/18/2020  1141   GFRAA >60 08/30/2020 1105   GFRAA >60 07/18/2020 1141    No results found for: SPEP, UPEP  Lab Results  Component Value Date   WBC 5.2 08/30/2020   NEUTROABS 2.8 08/30/2020   HGB 11.8 (L) 08/30/2020   HCT 36.0 08/30/2020   MCV 105.9 (H) 08/30/2020   PLT 169 08/30/2020      Chemistry      Component Value Date/Time   NA 138 08/30/2020 1105   NA 142 12/08/2017 1051   K 4.1 08/30/2020 1105   K 4.4 12/08/2017 1051   CL 105 08/30/2020 1105   CL 105 06/14/2017 1430   CO2 30 08/30/2020 1105   CO2 28 12/08/2017 1051   BUN 11 08/30/2020 1105   BUN 16.2 12/08/2017 1051   CREATININE 0.72 08/30/2020 1105   CREATININE 0.72 07/18/2020 1141   CREATININE 0.9 12/08/2017 1051      Component Value Date/Time   CALCIUM 9.1 08/30/2020 1105   CALCIUM 9.5 12/08/2017 1051   ALKPHOS 209 (H) 08/30/2020 1105   ALKPHOS 131 12/08/2017 1051   AST 35 08/30/2020 1105   AST 26 07/18/2020 1141   AST 18 12/08/2017 1051   ALT 34 08/30/2020 1105   ALT 25 07/18/2020 1141   ALT 20 12/08/2017 1051   BILITOT 0.4 08/30/2020 1105   BILITOT 0.5 07/18/2020 1141   BILITOT 0.70 12/08/2017 1051       RADIOGRAPHIC STUDIES: I have reviewed multiple CT imaging with the patient I have personally reviewed the radiological images as listed and agreed with the findings in the report. CT CHEST ABDOMEN PELVIS W CONTRAST  Result Date: 09/12/2020 CLINICAL DATA:  Follow-up ovarian carcinoma.  Ongoing chemotherapy. EXAM: CT CHEST, ABDOMEN, AND PELVIS WITH CONTRAST TECHNIQUE: Multidetector CT imaging of the chest, abdomen and pelvis was performed following the standard protocol during bolus administration of intravenous contrast. CONTRAST:  185m OMNIPAQUE IOHEXOL 300 MG/ML  SOLN COMPARISON:  AP CT on 07/09/2020, and chest CT on 01/12/2019 FINDINGS: CT CHEST FINDINGS Cardiovascular: No acute findings. Mediastinum/Lymph Nodes: No masses or pathologically enlarged lymph nodes identified. Mild goiter is stable.  Lungs/Pleura: Stable pleural-parenchymal scarring and bronchiectasis 5 mainly in the right upper lung zone. No suspicious pulmonary nodules or mass identified. No evidence of pulmonary infiltrate or pleural effusion. Musculoskeletal:  No suspicious bone lesions identified. CT ABDOMEN AND PELVIS FINDINGS Hepatobiliary: Small low-attenuation lesion in the caudate lobe is stable, and previously shown  to represent a benign hemangioma. No new or enlarging liver lesions identified. Gallbladder is unremarkable. No evidence of biliary ductal dilatation. Pancreas:  No mass or inflammatory changes. Spleen:  Within normal limits in size and appearance. Adrenals/Urinary tract: Stable mild bilateral renal parenchymal scarring. No masses or hydronephrosis. Stomach/Bowel: No evidence of obstruction, inflammatory process, or abnormal fluid collections. Vascular/Lymphatic: No pathologically enlarged lymph nodes identified. No abdominal aortic aneurysm. Reproductive: Prior hysterectomy. Rounded soft tissue prominence involving the left vaginal cuff measures 3.0 x 2.0 cm on image 112/2, without significant change since previous study. A peritoneal mass in the right pelvis which involves a distal small bowel loops is increased in size, currently measuring 4.3 x 2.3 cm on image 99/2, compared to 3.4 x 2.0 cm previously. Several other peritoneal nodules in the right lower quadrant mesentery measuring up to 1.8 cm show no significant change. No evidence of ascites. Other:  None. Musculoskeletal:  No suspicious bone lesions identified. IMPRESSION: Mild increase in size of peritoneal metastasis in the right pelvis. Other small peritoneal nodules in right lower quadrant mesentery remain stable. Stable soft tissue prominence involving the left vaginal cuff, suspicious for tumor. No evidence of metastatic disease within the chest. Electronically Signed   By: Marlaine Hind M.D.   On: 09/12/2020 15:57

## 2020-09-18 NOTE — Progress Notes (Signed)
Pharmacist Chemotherapy Monitoring - Initial Assessment    Anticipated start date: 09/20/2020   Regimen:  . Are orders appropriate based on the patient's diagnosis, regimen, and cycle? Yes . Does the plan date match the patient's scheduled date? Yes . Is the sequencing of drugs appropriate? Yes . Are the premedications appropriate for the patient's regimen? Yes . Prior Authorization for treatment is: Pending o If applicable, is the correct biosimilar selected based on the patient's insurance? not applicable  Organ Function and Labs: Marland Kitchen Are dose adjustments needed based on the patient's renal function, hepatic function, or hematologic function? Yes . Are appropriate labs ordered prior to the start of patient's treatment? Yes . Other organ system assessment, if indicated: N/A . The following baseline labs, if indicated, have been ordered: N/A  Dose Assessment: . Are the drug doses appropriate? Yes . Are the following correct: o Drug concentrations Yes o IV fluid compatible with drug Yes o Administration routes Yes o Timing of therapy Yes . If applicable, does the patient have documented access for treatment and/or plans for port-a-cath placement? yes . If applicable, have lifetime cumulative doses been properly documented and assessed? not applicable Lifetime Dose Tracking  . Carboplatin: 8,940 mg = 0.01 % of the maximum lifetime dose of 999,999,999 mg  o   Toxicity Monitoring/Prevention: . The patient has the following take home antiemetics prescribed: Ondansetron . The patient has the following take home medications prescribed: N/A . Medication allergies and previous infusion related reactions, if applicable, have been reviewed and addressed. No . The patient's current medication list has been assessed for drug-drug interactions with their chemotherapy regimen. no significant drug-drug interactions were identified on review.  Order Review: . Are the treatment plan orders signed?  Yes . Is the patient scheduled to see a provider prior to their treatment? No  I verify that I have reviewed each item in the above checklist and answered each question accordingly.  Tomasz Steeves D 09/18/2020 7:56 AM

## 2020-09-20 ENCOUNTER — Other Ambulatory Visit: Payer: Self-pay

## 2020-09-20 ENCOUNTER — Inpatient Hospital Stay: Payer: Medicare (Managed Care)

## 2020-09-20 ENCOUNTER — Inpatient Hospital Stay: Payer: Medicare (Managed Care) | Admitting: Hematology and Oncology

## 2020-09-20 VITALS — BP 114/58 | HR 72 | Temp 98.3°F | Resp 18 | Wt 186.1 lb

## 2020-09-20 DIAGNOSIS — C800 Disseminated malignant neoplasm, unspecified: Secondary | ICD-10-CM

## 2020-09-20 DIAGNOSIS — Z5111 Encounter for antineoplastic chemotherapy: Secondary | ICD-10-CM | POA: Diagnosis not present

## 2020-09-20 DIAGNOSIS — C562 Malignant neoplasm of left ovary: Secondary | ICD-10-CM

## 2020-09-20 DIAGNOSIS — C563 Malignant neoplasm of bilateral ovaries: Secondary | ICD-10-CM

## 2020-09-20 DIAGNOSIS — Z95828 Presence of other vascular implants and grafts: Secondary | ICD-10-CM

## 2020-09-20 DIAGNOSIS — Z23 Encounter for immunization: Secondary | ICD-10-CM

## 2020-09-20 DIAGNOSIS — Z7189 Other specified counseling: Secondary | ICD-10-CM

## 2020-09-20 LAB — CBC WITH DIFFERENTIAL/PLATELET
Abs Immature Granulocytes: 0.01 10*3/uL (ref 0.00–0.07)
Basophils Absolute: 0 10*3/uL (ref 0.0–0.1)
Basophils Relative: 1 %
Eosinophils Absolute: 0.1 10*3/uL (ref 0.0–0.5)
Eosinophils Relative: 3 %
HCT: 33.2 % — ABNORMAL LOW (ref 36.0–46.0)
Hemoglobin: 11.1 g/dL — ABNORMAL LOW (ref 12.0–15.0)
Immature Granulocytes: 0 %
Lymphocytes Relative: 38 %
Lymphs Abs: 1.7 10*3/uL (ref 0.7–4.0)
MCH: 35.7 pg — ABNORMAL HIGH (ref 26.0–34.0)
MCHC: 33.4 g/dL (ref 30.0–36.0)
MCV: 106.8 fL — ABNORMAL HIGH (ref 80.0–100.0)
Monocytes Absolute: 0.7 10*3/uL (ref 0.1–1.0)
Monocytes Relative: 15 %
Neutro Abs: 1.9 10*3/uL (ref 1.7–7.7)
Neutrophils Relative %: 43 %
Platelets: 178 10*3/uL (ref 150–400)
RBC: 3.11 MIL/uL — ABNORMAL LOW (ref 3.87–5.11)
RDW: 17 % — ABNORMAL HIGH (ref 11.5–15.5)
WBC: 4.3 10*3/uL (ref 4.0–10.5)
nRBC: 0 % (ref 0.0–0.2)

## 2020-09-20 LAB — COMPREHENSIVE METABOLIC PANEL
ALT: 35 U/L (ref 0–44)
AST: 42 U/L — ABNORMAL HIGH (ref 15–41)
Albumin: 2.9 g/dL — ABNORMAL LOW (ref 3.5–5.0)
Alkaline Phosphatase: 260 U/L — ABNORMAL HIGH (ref 38–126)
Anion gap: 1 — ABNORMAL LOW (ref 5–15)
BUN: 11 mg/dL (ref 8–23)
CO2: 31 mmol/L (ref 22–32)
Calcium: 9 mg/dL (ref 8.9–10.3)
Chloride: 106 mmol/L (ref 98–111)
Creatinine, Ser: 0.73 mg/dL (ref 0.44–1.00)
GFR, Estimated: >60 mL/min (ref >60)
Glucose, Bld: 90 mg/dL (ref 70–99)
Potassium: 3.8 mmol/L (ref 3.5–5.1)
Sodium: 138 mmol/L (ref 135–145)
Total Bilirubin: 0.5 mg/dL (ref 0.3–1.2)
Total Protein: 6.8 g/dL (ref 6.5–8.1)

## 2020-09-20 MED ORDER — INFLUENZA VAC A&B SA ADJ QUAD 0.5 ML IM PRSY
0.5000 mL | PREFILLED_SYRINGE | Freq: Once | INTRAMUSCULAR | Status: AC
  Administered 2020-09-20: 0.5 mL via INTRAMUSCULAR

## 2020-09-20 MED ORDER — SODIUM CHLORIDE 0.9% FLUSH
10.0000 mL | Freq: Once | INTRAVENOUS | Status: AC
  Administered 2020-09-20: 10 mL
  Filled 2020-09-20: qty 10

## 2020-09-20 MED ORDER — PROCHLORPERAZINE MALEATE 10 MG PO TABS
ORAL_TABLET | ORAL | Status: AC
  Filled 2020-09-20: qty 1

## 2020-09-20 MED ORDER — HEPARIN SOD (PORK) LOCK FLUSH 100 UNIT/ML IV SOLN
500.0000 [IU] | Freq: Once | INTRAVENOUS | Status: AC | PRN
  Administered 2020-09-20: 500 [IU]
  Filled 2020-09-20: qty 5

## 2020-09-20 MED ORDER — TOPOTECAN HCL CHEMO INJECTION 4 MG
3.2000 mg/m2 | Freq: Once | INTRAVENOUS | Status: AC
  Administered 2020-09-20: 6.8 mg via INTRAVENOUS
  Filled 2020-09-20: qty 6.8

## 2020-09-20 MED ORDER — SODIUM CHLORIDE 0.9 % IV SOLN
Freq: Once | INTRAVENOUS | Status: AC
  Filled 2020-09-20: qty 250

## 2020-09-20 MED ORDER — INFLUENZA VAC A&B SA ADJ QUAD 0.5 ML IM PRSY
PREFILLED_SYRINGE | INTRAMUSCULAR | Status: AC
  Filled 2020-09-20: qty 0.5

## 2020-09-20 MED ORDER — PROCHLORPERAZINE MALEATE 10 MG PO TABS
10.0000 mg | ORAL_TABLET | Freq: Once | ORAL | Status: AC
  Administered 2020-09-20: 10 mg via ORAL

## 2020-09-20 MED ORDER — SODIUM CHLORIDE 0.9% FLUSH
10.0000 mL | INTRAVENOUS | Status: DC | PRN
  Administered 2020-09-20: 10 mL
  Filled 2020-09-20: qty 10

## 2020-09-20 NOTE — Patient Instructions (Signed)

## 2020-09-20 NOTE — Patient Instructions (Signed)
Topotecan injection What is this medicine? TOPOTECAN (TOE poe TEE kan) is a chemotherapy drug. It is used to treat lung cancer, ovarian cancer, and cervical cancer. This medicine may be used for other purposes; ask your health care provider or pharmacist if you have questions. COMMON BRAND NAME(S): Hycamtin What should I tell my health care provider before I take this medicine? They need to know if you have any of these conditions:  immune system problems  infection (especially a virus infection such as chickenpox, cold sores, or herpes)  kidney disease  low blood counts, like low white cell, platelet, or red cell counts  lung or breathing disease, like asthma  scarring or thickening of the lungs  an unusual or allergic reaction to topotecan, other medicines, foods, dyes, or preservatives  pregnant or trying to get pregnant  breast-feeding How should I use this medicine? This medicine is for infusion into a vein. It is usually given by a health care professional in a hospital or clinic setting. Talk to your pediatrician regarding the use of this medicine in children. Special care may be needed. Overdosage: If you think you have taken too much of this medicine contact a poison control center or emergency room at once. NOTE: This medicine is only for you. Do not share this medicine with others. What if I miss a dose? It is important not to miss your dose. Call your doctor or health care professional if you are unable to keep an appointment. What may interact with this medicine?  amiodarone  azithromycin  captopril  carvedilol  certain medications for fungal infections like ketoconazole and itraconazole  clarithromycin  conivaptan  cyclosporine  dronedarone  eltrombopag  erythromycin  felodipine  grapefruit juice  lopinavir  quercetin  quinidine  ranolazine  ritonavir  ticagrelor  verapamil This list may not describe all possible interactions.  Give your health care provider a list of all the medicines, herbs, non-prescription drugs, or dietary supplements you use. Also tell them if you smoke, drink alcohol, or use illegal drugs. Some items may interact with your medicine. What should I watch for while using this medicine? This drug may make you feel generally unwell. This is not uncommon, as chemotherapy can affect healthy cells as well as cancer cells. Report any side effects. Continue your course of treatment even though you feel ill unless your doctor tells you to stop. Call your doctor or health care professional for advice if you get a fever, chills or sore throat, or other symptoms of a cold or flu. Do not treat yourself. This drug decreases your body's ability to fight infections. Try to avoid being around people who are sick. This medicine may increase your risk to bruise or bleed. Call your doctor or health care professional if you notice any unusual bleeding. Be careful brushing and flossing your teeth or using a toothpick because you may get an infection or bleed more easily. If you have any dental work done, tell your dentist you are receiving this medicine. Avoid taking products that contain aspirin, acetaminophen, ibuprofen, naproxen, or ketoprofen unless instructed by your doctor. These medicines may hide a fever. Do not become pregnant while taking this medicine or for 6 months after stopping it. Women should inform their doctor if they wish to become pregnant or think they might be pregnant. Men should not father a child while taking this medicine and for 3 months after stopping it. There is a potential for serious side effects to an unborn child.   Talk to your health care professional or pharmacist for more information. Do not breast-feed an infant while taking this medicine. In females, this medicine may make it more difficult to get pregnant. This medicine has also caused reduced sperm counts in some men. This may interfere with  the ability to father a child. You should talk to your doctor or health care professional if you are concerned about your fertility. What side effects may I notice from receiving this medicine? Side effects that you should report to your doctor or health care professional as soon as possible:  allergic reactions like skin rash, itching or hives, swelling of the face, lips, or tongue  breathing difficulties  diarrhea  signs of decreased platelets or bleeding - bruising, pinpoint red spots on the skin, black, tarry stools, blood in the urine  signs of decreased red blood cells - unusually weak or tired, feeling faint or lightheaded, falls  signs and symptoms of infection like fever or chills; cough; sore throat; pain or trouble passing urine Side effects that usually do not require medical attention (report to your doctor or health care professional if they continue or are bothersome):  hair loss  headache  loss of appetite  nausea, vomiting  stomach pain This list may not describe all possible side effects. Call your doctor for medical advice about side effects. You may report side effects to FDA at 1-800-FDA-1088. Where should I keep my medicine? Keep out of the reach of children. This drug is usually given in a hospital or clinic and will not be stored at home. In rare cases, this medicine may be given at home. If you are using this medicine at home, you will be instructed on how to store this medicine. Throw away any unused medicine after the expiration date on the label. NOTE: This sheet is a summary. It may not cover all possible information. If you have questions about this medicine, talk to your doctor, pharmacist, or health care provider.  2020 [employer]/Gold Standard (2017-08-19 11:35:12)  

## 2020-09-21 LAB — CA 125: Cancer Antigen (CA) 125: 1288 U/mL — ABNORMAL HIGH (ref 0.0–38.1)

## 2020-09-23 ENCOUNTER — Telehealth: Payer: Self-pay

## 2020-09-23 NOTE — Telephone Encounter (Signed)
-----   Message from Manuella Ghazi, RN sent at 09/20/2020  2:23 PM EDT ----- Regarding: Alvy Bimler- first time follow up Please follow up with patient. She received topotecan for first time today 09/20/20. Thanks!

## 2020-09-23 NOTE — Telephone Encounter (Signed)
Called and left a message on daughter phone to see how her Mom is doing after new treatment. Ask her to call the office back for questions or concerns.

## 2020-09-27 ENCOUNTER — Inpatient Hospital Stay: Payer: Medicare (Managed Care)

## 2020-09-27 ENCOUNTER — Inpatient Hospital Stay (HOSPITAL_BASED_OUTPATIENT_CLINIC_OR_DEPARTMENT_OTHER): Payer: Medicare (Managed Care) | Admitting: Hematology and Oncology

## 2020-09-27 ENCOUNTER — Encounter: Payer: Self-pay | Admitting: Hematology and Oncology

## 2020-09-27 ENCOUNTER — Other Ambulatory Visit: Payer: Self-pay

## 2020-09-27 VITALS — BP 111/71 | HR 84 | Temp 98.0°F | Resp 18

## 2020-09-27 DIAGNOSIS — D61818 Other pancytopenia: Secondary | ICD-10-CM

## 2020-09-27 DIAGNOSIS — Z95828 Presence of other vascular implants and grafts: Secondary | ICD-10-CM

## 2020-09-27 DIAGNOSIS — C563 Malignant neoplasm of bilateral ovaries: Secondary | ICD-10-CM

## 2020-09-27 DIAGNOSIS — R1084 Generalized abdominal pain: Secondary | ICD-10-CM | POA: Diagnosis not present

## 2020-09-27 DIAGNOSIS — C800 Disseminated malignant neoplasm, unspecified: Secondary | ICD-10-CM

## 2020-09-27 DIAGNOSIS — C562 Malignant neoplasm of left ovary: Secondary | ICD-10-CM

## 2020-09-27 DIAGNOSIS — Z7189 Other specified counseling: Secondary | ICD-10-CM

## 2020-09-27 DIAGNOSIS — R4702 Dysphasia: Secondary | ICD-10-CM

## 2020-09-27 DIAGNOSIS — Z5111 Encounter for antineoplastic chemotherapy: Secondary | ICD-10-CM | POA: Diagnosis not present

## 2020-09-27 LAB — CBC WITH DIFFERENTIAL/PLATELET
Abs Immature Granulocytes: 0.02 10*3/uL (ref 0.00–0.07)
Basophils Absolute: 0 10*3/uL (ref 0.0–0.1)
Basophils Relative: 0 %
Eosinophils Absolute: 0.1 10*3/uL (ref 0.0–0.5)
Eosinophils Relative: 3 %
HCT: 32.4 % — ABNORMAL LOW (ref 36.0–46.0)
Hemoglobin: 10.8 g/dL — ABNORMAL LOW (ref 12.0–15.0)
Immature Granulocytes: 1 %
Lymphocytes Relative: 52 %
Lymphs Abs: 1.4 10*3/uL (ref 0.7–4.0)
MCH: 35.5 pg — ABNORMAL HIGH (ref 26.0–34.0)
MCHC: 33.3 g/dL (ref 30.0–36.0)
MCV: 106.6 fL — ABNORMAL HIGH (ref 80.0–100.0)
Monocytes Absolute: 0.2 10*3/uL (ref 0.1–1.0)
Monocytes Relative: 9 %
Neutro Abs: 0.9 10*3/uL — ABNORMAL LOW (ref 1.7–7.7)
Neutrophils Relative %: 35 %
Platelets: 115 10*3/uL — ABNORMAL LOW (ref 150–400)
RBC: 3.04 MIL/uL — ABNORMAL LOW (ref 3.87–5.11)
RDW: 15.9 % — ABNORMAL HIGH (ref 11.5–15.5)
WBC: 2.6 10*3/uL — ABNORMAL LOW (ref 4.0–10.5)
nRBC: 0 % (ref 0.0–0.2)

## 2020-09-27 LAB — COMPREHENSIVE METABOLIC PANEL
ALT: 33 U/L (ref 0–44)
AST: 39 U/L (ref 15–41)
Albumin: 3.2 g/dL — ABNORMAL LOW (ref 3.5–5.0)
Alkaline Phosphatase: 310 U/L — ABNORMAL HIGH (ref 38–126)
Anion gap: 7 (ref 5–15)
BUN: 8 mg/dL (ref 8–23)
CO2: 28 mmol/L (ref 22–32)
Calcium: 9.5 mg/dL (ref 8.9–10.3)
Chloride: 106 mmol/L (ref 98–111)
Creatinine, Ser: 0.73 mg/dL (ref 0.44–1.00)
GFR, Estimated: >60 mL/min (ref >60)
Glucose, Bld: 117 mg/dL — ABNORMAL HIGH (ref 70–99)
Potassium: 3.6 mmol/L (ref 3.5–5.1)
Sodium: 141 mmol/L (ref 135–145)
Total Bilirubin: 0.6 mg/dL (ref 0.3–1.2)
Total Protein: 7 g/dL (ref 6.5–8.1)

## 2020-09-27 MED ORDER — SODIUM CHLORIDE 0.9 % IV SOLN
Freq: Once | INTRAVENOUS | Status: AC
  Filled 2020-09-27: qty 250

## 2020-09-27 MED ORDER — SODIUM CHLORIDE 0.9% FLUSH
10.0000 mL | Freq: Once | INTRAVENOUS | Status: AC
  Administered 2020-09-27: 10 mL
  Filled 2020-09-27: qty 10

## 2020-09-27 MED ORDER — OXYCODONE HCL 10 MG PO TABS
10.0000 mg | ORAL_TABLET | Freq: Four times a day (QID) | ORAL | 0 refills | Status: DC | PRN
Start: 2020-09-27 — End: 2020-11-27

## 2020-09-27 MED ORDER — HEPARIN SOD (PORK) LOCK FLUSH 100 UNIT/ML IV SOLN
500.0000 [IU] | Freq: Once | INTRAVENOUS | Status: AC | PRN
  Administered 2020-09-27: 500 [IU]
  Filled 2020-09-27: qty 5

## 2020-09-27 MED ORDER — PROCHLORPERAZINE MALEATE 10 MG PO TABS
10.0000 mg | ORAL_TABLET | Freq: Once | ORAL | Status: AC
  Administered 2020-09-27: 10 mg via ORAL

## 2020-09-27 MED ORDER — SODIUM CHLORIDE 0.9% FLUSH
10.0000 mL | INTRAVENOUS | Status: DC | PRN
  Administered 2020-09-27: 10 mL
  Filled 2020-09-27: qty 10

## 2020-09-27 MED ORDER — PROCHLORPERAZINE MALEATE 10 MG PO TABS
ORAL_TABLET | ORAL | Status: AC
  Filled 2020-09-27: qty 1

## 2020-09-27 MED ORDER — LIDOCAINE-PRILOCAINE 2.5-2.5 % EX CREA: TOPICAL_CREAM | CUTANEOUS | 1 refills | Status: DC

## 2020-09-27 MED ORDER — TOPOTECAN HCL CHEMO INJECTION 4 MG
3.0000 mg/m2 | Freq: Once | INTRAVENOUS | Status: AC
  Administered 2020-09-27: 6.4 mg via INTRAVENOUS
  Filled 2020-09-27: qty 6.4

## 2020-09-27 NOTE — Assessment & Plan Note (Addendum)
She has mild pancytopenia from treatment Due to lack of symptoms, we will proceed with treatment with minor dose adjustment She will proceed with modified chemo schedule, to receive treatment on days 1 and 8, rest day 15 for cycle of every 21 days I recommend minimum 3-4 cycles before repeat imaging study around December

## 2020-09-27 NOTE — Assessment & Plan Note (Signed)
She has multifocal pain the current prescribed pain medicine is adequate to control her pain She will continue oxycodone as prescribed, medication is refilled today

## 2020-09-27 NOTE — Assessment & Plan Note (Signed)
She is not symptomatic and would like to proceed I recommend minor dose adjustment We discussed neutropenic precaution

## 2020-09-27 NOTE — Assessment & Plan Note (Signed)
She has expressive dysphagia from prior history of stroke I recommend her daughter to come with her for doctor's visit appointment in the future to facilitate accurate communication

## 2020-09-27 NOTE — Patient Instructions (Signed)

## 2020-09-27 NOTE — Patient Instructions (Signed)
[facility] Discharge Instructions for Patients Receiving Chemotherapy  Today you received the following chemotherapy agents Topotecan  To help prevent nausea and vomiting after your treatment, we encourage you to take your nausea medication as directed   If you develop nausea and vomiting that is not controlled by your nausea medication, call the clinic.   BELOW ARE SYMPTOMS THAT SHOULD BE REPORTED IMMEDIATELY:  *FEVER GREATER THAN 100.5 F  *CHILLS WITH OR WITHOUT FEVER  NAUSEA AND VOMITING THAT IS NOT CONTROLLED WITH YOUR NAUSEA MEDICATION  *UNUSUAL SHORTNESS OF BREATH  *UNUSUAL BRUISING OR BLEEDING  TENDERNESS IN MOUTH AND THROAT WITH OR WITHOUT PRESENCE OF ULCERS  *URINARY PROBLEMS  *BOWEL PROBLEMS  UNUSUAL RASH Items with * indicate a potential emergency and should be followed up as soon as possible.  Feel free to call the clinic should you have any questions or concerns. The clinic phone number is [phone].  Please show the CHEMO ALERT CARD at check-in to the Emergency Department and triage nurse.   

## 2020-09-27 NOTE — Progress Notes (Signed)
[place] OFFICE PROGRESS NOTE  Patient Care Team: Helane Rima, MD as PCP - General (Family Medicine) Lonia Mad, MD as Referring Physician (Neurology) Cameron Sprang, MD as Consulting Physician (Neurology)  ASSESSMENT & PLAN:  Ovarian cancer [facility]) She has mild pancytopenia from treatment Due to lack of symptoms, we will proceed with treatment with minor dose adjustment She will proceed with modified chemo schedule, to receive treatment on days 1 and 8, rest day 15 for cycle of every 21 days I recommend minimum 3-4 cycles before repeat imaging study around December  Pancytopenia, acquired [facility]) She is not symptomatic and would like to proceed I recommend minor dose adjustment We discussed neutropenic precaution  Expressive dysphasia She has expressive dysphagia from prior history of stroke I recommend her daughter to come with her for doctor's visit appointment in the future to facilitate accurate communication  Abdominal pain She has multifocal pain the current prescribed pain medicine is adequate to control her pain She will continue oxycodone as prescribed, medication is refilled today   No orders of the defined types were placed in this encounter.   All questions were answered. The patient knows to call the clinic with any problems, questions or concerns. The total time spent in the appointment was 25 minutes encounter with patients including review of chart and various tests results, discussions about plan of care and coordination of care plan   Heath Lark, MD 09/27/2020 10:36 AM  INTERVAL HISTORY: Please see below for problem oriented charting. She returns with her daughter, Kenney Houseman for further follow-up She tolerated treatment well except for mild generalized weakness, which she described as wobbly Her chronic abdominal pain is stable Denies recent nausea or constipation No recent fever or chills Oral intake is stable  SUMMARY OF ONCOLOGIC  HISTORY: Oncology History Overview Note  Negative genetic testing Progressed on Niraparib, gemzar and carboplatin   Carcinomatosis ([place])  12/06/2015 Initial Diagnosis   Carcinomatosis ([place])   07/18/2020 - 08/30/2020 Chemotherapy   The patient had carboplatin for chemotherapy treatment.     09/20/2020 -  Chemotherapy   The patient had topotecan (HYCAMTIN) 6.8 mg in sodium chloride 0.9 % 100 mL chemo infusion, 3.2 mg/m2 = 6.8 mg (80 % of original dose 4 mg/m2), Intravenous,  Once, 1 of 4 cycles Dose modification: 3.2 mg/m2 (80 % of original dose 4 mg/m2, Cycle 1, Reason: Provider Judgment), 3 mg/m2 (75 % of original dose 4 mg/m2, Cycle 1, Reason: Dose Not Tolerated) Administration: 6.8 mg (09/20/2020)  for chemotherapy treatment.    Ovarian cancer, bilateral  12/24/2015 Initial Diagnosis   Ovarian cancer, bilateral ([place])   07/18/2020 - 08/30/2020 Chemotherapy   The patient had carboplatin for chemotherapy treatment.     09/20/2020 -  Chemotherapy   The patient had topotecan (HYCAMTIN) 6.8 mg in sodium chloride 0.9 % 100 mL chemo infusion, 3.2 mg/m2 = 6.8 mg (80 % of original dose 4 mg/m2), Intravenous,  Once, 1 of 4 cycles Dose modification: 3.2 mg/m2 (80 % of original dose 4 mg/m2, Cycle 1, Reason: Provider Judgment), 3 mg/m2 (75 % of original dose 4 mg/m2, Cycle 1, Reason: Dose Not Tolerated) Administration: 6.8 mg (09/20/2020)  for chemotherapy treatment.    Ovarian cancer ([place])  11/28/2015 Imaging   Ct scan of abdomen: Peritoneal carcinomatosis and pelvic/inguinal adenopathy. Gynecologic primary is favored.   12/17/2015 Pathology Results   Lymph node, needle/core biopsy, L inguinal LAN - METASTATIC ADENOCARCINOMA. Microscopic Comment Immunohistochemistry will be performed and reported as  an addendum. (JDP:ecj 12/18/2015) ADDENDUM: Immunohistochemistry shows strong positivity with cytokeratin 7, estrogen receptor (ER), progesterone receptor (PR), p53 and WT1. Negative markers are  cytokeratin 20 , CDX- 2 and gross cystic disease fluid protein. 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 (JDP:kh 12-19-15   12/17/2015 Procedure   Technically successful ultrasound guided core left inguinal adenopathy biopsy   12/24/2015 Tumor Marker   Patient's tumor was tested for the following markers: CA-125 Results of the tumor marker test revealed 608.2   12/27/2015 - 02/28/2016 Chemotherapy   She received neoadjuvant chemo x 3 cycles   01/01/2016 Tumor Marker   Patient's tumor was tested for the following markers: CA-125 Results of the tumor marker test revealed 741.6   01/29/2016 Procedure   Successful placement of a right internal jugular approach power injectable Port-A-Cath. The catheter is ready for immediate use   01/29/2016 Imaging   US abdomen 1. Hyperechoic 2.5 x 1.0 x 1.6 cm lesion in the region the caudate lobe of the liver. Similar finding noted on prior CT of 11/28/2015. This could represent hemangioma. This could represent a malignancy including metastatic disease. 2. Exam otherwise unremarkable. No gallstones. No biliary distention.   02/03/2016 Tumor Marker   Patient's tumor was tested for the following markers: CA-125 Results of the tumor marker test revealed 137.1   02/20/2016 Imaging   MRI brain No acute infarct.  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  No intracranial mass.  Moderate small vessel disease changes.  Global atrophy without hydrocephalus.  Expanded partially empty sella without secondary findings of pseudotumor cerebri.  Minimal mucosal thickening ethmoid sinus air cells. Small air-fluid levels maxillary sinuses bilaterally may indicate changes of acute sinusitis.    02/28/2016 Tumor Marker   Patient's tumor was tested for the following markers: CA-125 Results of the tumor marker test revealed 33.6   03/02/2016  Imaging   CT chest, abdomen and pelvis 1. Today's study demonstrates a positive response to therapy with resolution of the previously noted malignant ascites, significant regression of previously noted peritoneal implants, and regression of previously noted lymphadenopathy in the abdomen and pelvis. 2. No definite evidence to suggest metastatic disease to the thorax. 3. Stable 1.0 x 1.7 cm intermediate attenuation lesion associated with the posterior aspect of segment 1 of the liver, favored to represent a mildly proteinaceous hepatic cyst. The possibility of a peritoneal implant in this region is not entirely excluded, but is not favored on today's examination. 4. Extensive post infectious scarring inguinal upper right lung, likely sequela of prior necrotizing pneumonia. 5. Multiple tiny pulmonary nodules scattered throughout the lungs bilaterally all measuring 4 mm or less. These are nonspecific, but favored to be benign. Given the patient's history of primary gynecologic malignancy, attention on followup studies is recommended to ensure the stability of these findings. 6. Additional incidental findings, as above   03/14/2016 Imaging   CT angiogram 1. No pulmonary emboli. 2. Chronic changes to the right upper lobe. 3. Stable lesion in the caudate lobe of the liver. 4. Stable soft tissue prominence to the right of the trachea as described above. 5. Stable small nodule in the right lung.    03/24/2016 Pathology Results   1. Omentum, resection for tumor - FOCAL ADENOCARCINOMA ASSOCIATED WITH EXTENSIVE FIBROSIS, INFLAMMATION AND HEMOSIDERIN DEPOSITION. 2. Uterus +/- tubes/ovaries, neoplastic, cervix - CERVIX: SLIGHT CERVICITIS AND SQUAMOUS METAPLASIA. - ENDOMETRIUM: ATROPHIC, NO HYPERPLASIA OR MALIGNANCY. - MYOMETRIUM: LEIOMYOMATA  WITH DEGENERATIVE CHANGES. NO MALIGNANCY. - UTERINE SEROSA: FOCAL ADENOCARCINOMA ASSOCIATED WITH ADHESIONS. - RIGHT OVARY: BENIGN CALCIFIED NODULE AND BENIGN SEROUS  CYST. NO MALIGNANCY IDENTIFIED. - RIGHT FALLOPIAN TUBE: UNREMARKABLE. NO MALIGNANCY IDENTIFIED. - LEFT OVARY: FOCAL ADENOCARCINOMA ASSOCIATED WITH EXTENSIVE FIBROSIS. - LEFT FALLOPIAN TUBE: HYDROSALPINX. NO MALIGNANCY IDENTIFIED. 3. Lymph node, biopsy, right external iliac - METASTATIC ADENOCARCINOMA, 4. Soft tissue, biopsy, left para colic gutter - FOCAL ADENOCARCINOMA ASSOCIATED WITH FIBROSIS. Microscopic Comment 2. OVARY Specimen(s): Uterus with bilateral ovaries, omentum, right external iliac lymph node and left paracolic gutter. Procedure: (including lymph node sampling) Hysterectomy with bilateral salpingo-oophorectomy, omentectomy, lymph node biopsy and paracolic gutter biopsy. Primary tumor site (including laterality): Left ovary. Ovarian surface involvement: Yes Ovarian capsule intact without fragmentation: N/A Maximum tumor size (cm): 2 cm, 2 cm Histologic type: Serous carcinoma. Grade: 3 Peritoneal implants: (specify invasive or non-invasive): Left paracolic gutter positive for carcinoma Pelvic extension (list additional structures on separate lines and if involved): Omentum, right paracolic gutter and uterine serosa. Lymph nodes: number examined 1 ; number positive 1 TNM code: ypT3a, ypN1b, ypMX FIGO Stage (based on pathologic findings, needs clinical correlation): IIIA2 Comments: The left ovary has a 2 cm nodule, which is composed primarily of fibrous tissue with small foci of residual adenocarcinoma. There is also microscopic involvement of the uterine serosa, the left paracolic gutter biopsy and the omentum. The left paracolic gutter and omental involvement is microscopic and both are associated with extensive fibrosis with focal hemosiderin deposition. The right external iliac node is completely replaced by metastatic adenocarcinoma with serous features. (JDP:ecj 03/30/2016)   03/24/2016 Surgery   Operation: Robotic-assisted laparoscopic total hysterectomy with bilateral  salpingoophorectomy, omentectomy, radical tumor debulking  Surgeon: Donaciano Eva  Operative Findings:  : small nodules in omentum, no ascites. Omental nodule (2cm) adherent to lower anterior abdominal wall. 2cm left colonic gutter cystic nodule. 6cm right external iliac lymph node. Grossly normal ovaries and tubes. Fibroid uterus. No gross residual disease at completion of surgery representing R0 optimal/complete resection.    04/17/2016 Imaging   CT abdomen and pelvis 1. There is no evidence of acute inflammatory process within abdomen or pelvis. 2. Stable low-density lesion within caudate lobe of the liver. No new hepatic lesions are noted. 3. Again noted lobulated renal contour and multifocal renal cortical scarring. No hydronephrosis or hydroureter. 4. No significant mesenteric adenopathy. No retroperitoneal adenopathy. Stable minimal residual peritoneal thickening within pelvis. No new pelvic implants or pelvic ascites. 5. Status post hysterectomy.  Unremarkable urinary bladder. 6. Moderate stool within colon as described above. No evidence of colitis.    04/24/2016 - 06/19/2016 Chemotherapy   She received 3 more cycles of chemo after surgery   05/14/2016 Tumor Marker   Patient's tumor was tested for the following markers: CA-125 Results of the tumor marker test revealed 22.5   06/04/2016 Tumor Marker   Patient's tumor was tested for the following markers: CA-125 Results of the tumor marker test revealed 13.7   07/07/2016 Genetic Testing   Patient has genetic testing done for breast/ovasrian cancer panel Results revealed patient has no actionable mutation   07/23/2016 Imaging   Ct abdomen and pelvis 1. Heterogeneously enhancing 4.9 x 3.2 x 4.4 cm mass along the right pelvic sidewall may represent locally recurrent disease or malignant lymphadenopathy. 2. No other signs of definite metastatic disease noted elsewhere in the abdomen or pelvis. 3. Stable low-attenuation  hepatic lesion in the caudate lobe of the liver, which appears to demonstrates some progressive  centripetal filling on delayed images. This lesion is presumably benign given its stability compared to prior studies, and is favored to represent a small cavernous hemangioma. 4. Cardiomegaly with biatrial dilatation, which is very severe on the right side. 5. Aortic atherosclerosis. 6. Additional incidental findings, as above.   07/23/2016 Tumor Marker   Patient's tumor was tested for the following markers: CA-125 Results of the tumor marker test revealed 12.3   09/30/2016 Imaging   Ct abdomen and pelvis: 1. 4.9 cm heterogeneously enhancing mass seen along the right pelvic sidewall previously has almost completely resolved. There is some ill-defined soft tissue attenuation/peritoneal thickening in this region today but no discrete measurable lesion is evident. 2. No new or progressive findings on today's exam. 3. Stable hypo attenuating lesion in the caudate lobe of the liver. 4. Subtle aortic atherosclerosis   09/30/2016 Tumor Marker   Patient's tumor was tested for the following markers: CA-125 Results of the tumor marker test revealed 9.6   10/05/2016 Pathology Results   Vagina, biopsy, left cuff - BENIGN FIBROEPITHELIAL (STROMAL) POLYP. - NO DYSPLASIA, ATYPIA OR MALIGNANCY IDENTIFIED.   01/14/2017 Tumor Marker   Patient's tumor was tested for the following markers: CA-125 Results of the tumor marker test revealed 11.9   05/05/2017 Tumor Marker   Patient's tumor was tested for the following markers: CA-125 Results of the tumor marker test revealed 28   06/14/2017 Tumor Marker   Patient's tumor was tested for the following markers: CA-125 Results of the tumor marker test revealed 45.7   06/24/2017 Imaging   Ct abdomen and pelvis: Increased peritoneal metastatic disease in abdomen and pelvis since prior exam. No evidence of ascites. Stable small benign hepatic hemangioma.   07/21/2017  Tumor Marker   Patient's tumor was tested for the following markers: CA-125 Results of the tumor marker test revealed 84.1   07/21/2017 - 11/03/2017 Chemotherapy   She received carboplatin and taxol   08/11/2017 Adverse Reaction   She had severe neuropathy due to treatment. Does of chemo is reduced starting cycle 2 onwards   09/02/2017 Tumor Marker   Patient's tumor was tested for the following markers: CA-125 Results of the tumor marker test revealed 27.7   09/16/2017 Imaging   CT CHEST IMPRESSION  1. No acute process or evidence of metastatic disease in the chest. 2. Right upper lung bronchiectasis, consolidation, and architectural distortion are most likely post infectious or inflammatory and not significantly changed. 3. Aortic Atherosclerosis (ICD10-I70.0).  CT ABDOMEN AND PELVIS IMPRESSION  1. Response to therapy of nodal and peritoneal metastasis. 2. No new or progressive disease. 3. Caudate lobe hemangioma, as before. 4. Bilateral renal cortical thinning. Similar minimal right-sided caliectasis and hydroureter. Likely secondary to low-grade obstruction by the dominant right pelvic implant.   09/22/2017 Tumor Marker   Patient's tumor was tested for the following markers: CA-125 Results of the tumor marker test revealed 18   11/03/2017 Tumor Marker   Patient's tumor was tested for the following markers: CA-125 Results of the tumor marker test revealed 13.4   11/25/2017 Imaging   1. Stable to slight interval decrease in size of nodal and peritoneal metastatic disease within the abdomen. 2. No new or progressive disease.   12/03/2017 - 09/11/2019 Chemotherapy   The patient is prescribed [place]   01/17/2018 Tumor Marker   Patient's tumor was tested for the following markers: CA-125 Results of the tumor marker test revealed 11.9   02/22/2018 Tumor Marker   Patient's tumor was tested for the  following markers: CA-125 Results of the tumor marker test revealed 11.7    02/22/2018 Imaging   1. Improved nodular thickening along the vaginal cuff on the left and medial to the right iliac vessels, likely treated implants. No evidence of progressive peritoneal disease. 2. Stable hepatic hemangioma. No evidence of solid visceral organ metastases. 3. Bilateral renal cortical scarring.   05/19/2018 Tumor Marker   Patient's tumor was tested for the following markers: CA-125 Results of the tumor marker test revealed 9.9   08/10/2018 Imaging   Stable mild nodular thickening of the left vaginal cuff. No new or progressive disease within the abdomen or pelvis.  Stable small hepatic hemangioma.   09/21/2018 Tumor Marker   Patient's tumor was tested for the following markers: CA-125 Results of the tumor marker test revealed 10.3   11/11/2018 Tumor Marker   Patient's tumor was tested for the following markers: CA-125 Results of the tumor marker test revealed 10.5   12/15/2018 Tumor Marker   Patient's tumor was tested for the following markers: CA-125 Results of the tumor marker test revealed 12.6   01/06/2019 Tumor Marker   Patient's tumor was tested for the following markers: CA-125 Results of the tumor marker test revealed 14.6   01/12/2019 Imaging   1. Chronic extensive right upper lobe scarring changes and bronchiectasis. 2. No mediastinal or hilar mass or adenopathy and no findings for pulmonary metastatic disease. 3. Stable caudate lobe hemangioma. 4. No worrisome omental or peritoneal surface lesions. 5. Stable pericecal and small pelvic lymph nodes. No new or progressive findings.    09/11/2019 Imaging   Ct abdomen and pelvis Pericecal peritoneal metastatic implants   09/19/2019 - 07/04/2020 Chemotherapy   The patient had carboplatin and gemxar for chemotherapy treatment, then on gemzar maintenance since 11/28/2019    11/20/2019 Imaging   1. Mild decrease in size of peritoneal tumor implant along the lateral aspect of the cecum. 2. Stable tiny sub-cm  mesenteric soft tissue nodules or lymph nodes in right lower quadrant and anterior pelvic mesentery. 3. No new or progressive disease identified within the abdomen or pelvis.   03/12/2020 Imaging   1. No acute findings within the abdomen or pelvis. 2. Stable appearance of right lower quadrant peritoneal implant adjacent to cecum. 3. Increase in size of right lower quadrant ileocolic lymph node. 1.2 cm on today's study versus 0.8 previously. 4. Possible early avascular necrosis involving the left femoral head.   Aortic Atherosclerosis (ICD10-I70.0).   07/04/2020 Tumor Marker   Patient's tumor was tested for the following markers: CA-125 Results of the tumor marker test revealed 73   07/09/2020 Imaging   1. Enlarging noncalcified implants in the ileocolonic mesentery, suspicious for metastatic disease in this patient with a rising serum CA 125 level. There is a questionable new peritoneal implant associated with the distal small bowel. No resulting bowel obstruction. 2. No evidence of visceral metastatic disease. 3. Aortic Atherosclerosis (ICD10-I70.0).   07/18/2020 - 08/30/2020 Chemotherapy   The patient had carboplatin for chemotherapy treatment.     07/18/2020 Tumor Marker   Patient's tumor was tested for the following markers: CA-125 Results of the tumor marker test revealed 123   08/08/2020 Tumor Marker   Patient's tumor was tested for the following markers: CA-125 Results of the tumor marker test revealed 231   08/30/2020 Tumor Marker   Patient's tumor was tested for the following markers: CA-125 Results of the tumor marker test revealed 635   09/12/2020 Imaging   Mild increase in  size of peritoneal metastasis in the right pelvis. Other small peritoneal nodules in right lower quadrant mesentery remain stable.    Stable soft tissue prominence involving the left vaginal cuff, suspicious for tumor.   No evidence of metastatic disease within the chest.     09/20/2020 -  Chemotherapy    The patient had topotecan (HYCAMTIN) 6.8 mg in sodium chloride 0.9 % 100 mL chemo infusion, 3.2 mg/m2 = 6.8 mg (80 % of original dose 4 mg/m2), Intravenous,  Once, 1 of 4 cycles Dose modification: 3.2 mg/m2 (80 % of original dose 4 mg/m2, Cycle 1, Reason: Provider Judgment), 3 mg/m2 (75 % of original dose 4 mg/m2, Cycle 1, Reason: Dose Not Tolerated) Administration: 6.8 mg (09/20/2020)  for chemotherapy treatment.    09/20/2020 Tumor Marker   Patient's tumor was tested for the following markers: CA-125 Results of the tumor marker test revealed 1288     REVIEW OF SYSTEMS:   Constitutional: Denies fevers, chills or abnormal weight loss Eyes: Denies blurriness of vision Ears, nose, mouth, throat, and face: Denies mucositis or sore throat Respiratory: Denies cough, dyspnea or wheezes Cardiovascular: Denies palpitation, chest discomfort or lower extremity swelling Gastrointestinal:  Denies nausea, heartburn or change in bowel habits Skin: Denies abnormal skin rashes Lymphatics: Denies new lymphadenopathy or easy bruising Neurological:Denies numbness, tingling or new weaknesses Behavioral/Psych: Mood is stable, no new changes  All other systems were reviewed with the patient and are negative.  I have reviewed the past medical history, past surgical history, social history and family history with the patient and they are unchanged from previous note.  ALLERGIES:  is allergic to codeine, lactulose, and latex.  MEDICATIONS:  Current Outpatient Medications  Medication Sig Dispense Refill  . apixaban (ELIQUIS) 5 MG TABS tablet Take 1 tablet (5 mg total) by mouth 2 (two) times daily. 60 tablet 5  . atorvastatin (LIPITOR) 40 MG tablet Take 40 mg by mouth at bedtime.    . gabapentin (NEURONTIN) 600 MG tablet TAKE 1 TABLET(600 MG) BY MOUTH TWICE DAILY 60 tablet 11  . lidocaine-prilocaine (EMLA) cream Apply to Porta-Cath 1-2 hours prior to access as directed. 30 g 1  . metoprolol succinate  (TOPROL-XL) 25 MG 24 hr tablet Take 25 mg by mouth daily.   5  . Multiple Vitamin (MULTIVITAMIN WITH MINERALS) TABS tablet Take 1 tablet by mouth daily.    Marland Kitchen omega-3 acid ethyl esters (LOVAZA) 1 g capsule Take 1 g by mouth daily.    . ondansetron (ZOFRAN ODT) 4 MG disintegrating tablet Take 1 tablet (4 mg total) by mouth every 8 (eight) hours as needed for nausea or vomiting. 60 tablet 9  . Oxycodone HCl 10 MG TABS Take 1 tablet (10 mg total) by mouth every 6 (six) hours as needed. 60 tablet 0  . PARoxetine (PAXIL) 40 MG tablet Take 40 mg by mouth daily.  0  . polyethylene glycol (MIRALAX / GLYCOLAX) packet Take 17 g by mouth daily.     No current facility-administered medications for this visit.    PHYSICAL EXAMINATION: ECOG PERFORMANCE STATUS: 2 - Symptomatic, <50% confined to bed  There were no vitals filed for this visit. There were no vitals filed for this visit.  GENERAL:alert, no distress and comfortable SKIN: skin color, texture, turgor are normal, no rashes or significant lesions EYES: normal, Conjunctiva are pink and non-injected, sclera clear OROPHARYNX:no exudate, no erythema and lips, buccal mucosa, and tongue normal  NECK: supple, thyroid normal size, non-tender, without nodularity LYMPH:  no palpable lymphadenopathy in the cervical, axillary or inguinal LUNGS: clear to auscultation and percussion with normal breathing effort HEART: regular rate & rhythm and no murmurs and no lower extremity edema ABDOMEN:abdomen soft, non-tender and normal bowel sounds Musculoskeletal:no cyanosis of digits and no clubbing  NEURO: alert & oriented x 3 with expressive dysphagia, right hemiparesis stable  LABORATORY DATA:  I have reviewed the data as listed    Component Value Date/Time   NA 141 09/27/2020 0945   NA 142 12/08/2017 1051   K 3.6 09/27/2020 0945   K 4.4 12/08/2017 1051   CL 106 09/27/2020 0945   CL 105 06/14/2017 1430   CO2 28 09/27/2020 0945   CO2 28 12/08/2017 1051    GLUCOSE 117 (H) 09/27/2020 0945   GLUCOSE 97 12/08/2017 1051   BUN 8 09/27/2020 0945   BUN 16.2 12/08/2017 1051   CREATININE 0.73 09/27/2020 0945   CREATININE 0.72 07/18/2020 1141   CREATININE 0.9 12/08/2017 1051   CALCIUM 9.5 09/27/2020 0945   CALCIUM 9.5 12/08/2017 1051   PROT 7.0 09/27/2020 0945   PROT 6.9 12/08/2017 1051   ALBUMIN 3.2 (L) 09/27/2020 0945   ALBUMIN 3.6 12/08/2017 1051   AST 39 09/27/2020 0945   AST 26 07/18/2020 1141   AST 18 12/08/2017 1051   ALT 33 09/27/2020 0945   ALT 25 07/18/2020 1141   ALT 20 12/08/2017 1051   ALKPHOS 310 (H) 09/27/2020 0945   ALKPHOS 131 12/08/2017 1051   BILITOT 0.6 09/27/2020 0945   BILITOT 0.5 07/18/2020 1141   BILITOT 0.70 12/08/2017 1051   GFRNONAA >60 09/27/2020 0945   GFRNONAA >60 07/18/2020 1141   GFRAA >60 08/30/2020 1105   GFRAA >60 07/18/2020 1141    No results found for: SPEP, UPEP  Lab Results  Component Value Date   WBC 2.6 (L) 09/27/2020   NEUTROABS 0.9 (L) 09/27/2020   HGB 10.8 (L) 09/27/2020   HCT 32.4 (L) 09/27/2020   MCV 106.6 (H) 09/27/2020   PLT 115 (L) 09/27/2020      Chemistry      Component Value Date/Time   NA 141 09/27/2020 0945   NA 142 12/08/2017 1051   K 3.6 09/27/2020 0945   K 4.4 12/08/2017 1051   CL 106 09/27/2020 0945   CL 105 06/14/2017 1430   CO2 28 09/27/2020 0945   CO2 28 12/08/2017 1051   BUN 8 09/27/2020 0945   BUN 16.2 12/08/2017 1051   CREATININE 0.73 09/27/2020 0945   CREATININE 0.72 07/18/2020 1141   CREATININE 0.9 12/08/2017 1051      Component Value Date/Time   CALCIUM 9.5 09/27/2020 0945   CALCIUM 9.5 12/08/2017 1051   ALKPHOS 310 (H) 09/27/2020 0945   ALKPHOS 131 12/08/2017 1051   AST 39 09/27/2020 0945   AST 26 07/18/2020 1141   AST 18 12/08/2017 1051   ALT 33 09/27/2020 0945   ALT 25 07/18/2020 1141   ALT 20 12/08/2017 1051   BILITOT 0.6 09/27/2020 0945   BILITOT 0.5 07/18/2020 1141   BILITOT 0.70 12/08/2017 1051

## 2020-10-02 ENCOUNTER — Telehealth: Payer: Self-pay | Admitting: Hematology and Oncology

## 2020-10-02 NOTE — Telephone Encounter (Signed)
Scheduled per 10/22 sch msg. Pt will receive an updated appt calendar at next visit per appt notes

## 2020-10-07 ENCOUNTER — Other Ambulatory Visit: Payer: Self-pay | Admitting: Hematology and Oncology

## 2020-10-07 DIAGNOSIS — C562 Malignant neoplasm of left ovary: Secondary | ICD-10-CM

## 2020-10-11 ENCOUNTER — Inpatient Hospital Stay: Payer: Medicare (Managed Care)

## 2020-10-11 ENCOUNTER — Other Ambulatory Visit: Payer: Self-pay

## 2020-10-11 ENCOUNTER — Inpatient Hospital Stay: Payer: Medicare (Managed Care) | Attending: Hematology and Oncology

## 2020-10-11 ENCOUNTER — Other Ambulatory Visit: Payer: Self-pay | Admitting: Hematology and Oncology

## 2020-10-11 VITALS — BP 115/69 | HR 74 | Temp 98.3°F | Resp 18 | Wt 184.5 lb

## 2020-10-11 DIAGNOSIS — Z5111 Encounter for antineoplastic chemotherapy: Secondary | ICD-10-CM | POA: Diagnosis present

## 2020-10-11 DIAGNOSIS — R978 Other abnormal tumor markers: Secondary | ICD-10-CM | POA: Diagnosis not present

## 2020-10-11 DIAGNOSIS — R110 Nausea: Secondary | ICD-10-CM | POA: Diagnosis not present

## 2020-10-11 DIAGNOSIS — C786 Secondary malignant neoplasm of retroperitoneum and peritoneum: Secondary | ICD-10-CM | POA: Diagnosis not present

## 2020-10-11 DIAGNOSIS — R918 Other nonspecific abnormal finding of lung field: Secondary | ICD-10-CM | POA: Diagnosis not present

## 2020-10-11 DIAGNOSIS — R748 Abnormal levels of other serum enzymes: Secondary | ICD-10-CM | POA: Insufficient documentation

## 2020-10-11 DIAGNOSIS — Z7901 Long term (current) use of anticoagulants: Secondary | ICD-10-CM | POA: Insufficient documentation

## 2020-10-11 DIAGNOSIS — C563 Malignant neoplasm of bilateral ovaries: Secondary | ICD-10-CM

## 2020-10-11 DIAGNOSIS — Z79899 Other long term (current) drug therapy: Secondary | ICD-10-CM | POA: Diagnosis not present

## 2020-10-11 DIAGNOSIS — C562 Malignant neoplasm of left ovary: Secondary | ICD-10-CM

## 2020-10-11 DIAGNOSIS — C541 Malignant neoplasm of endometrium: Secondary | ICD-10-CM | POA: Insufficient documentation

## 2020-10-11 DIAGNOSIS — D61818 Other pancytopenia: Secondary | ICD-10-CM | POA: Insufficient documentation

## 2020-10-11 DIAGNOSIS — Z8673 Personal history of transient ischemic attack (TIA), and cerebral infarction without residual deficits: Secondary | ICD-10-CM | POA: Diagnosis not present

## 2020-10-11 DIAGNOSIS — Z95828 Presence of other vascular implants and grafts: Secondary | ICD-10-CM

## 2020-10-11 DIAGNOSIS — R1310 Dysphagia, unspecified: Secondary | ICD-10-CM | POA: Diagnosis not present

## 2020-10-11 DIAGNOSIS — C800 Disseminated malignant neoplasm, unspecified: Secondary | ICD-10-CM

## 2020-10-11 DIAGNOSIS — Z7189 Other specified counseling: Secondary | ICD-10-CM

## 2020-10-11 LAB — COMPREHENSIVE METABOLIC PANEL
ALT: 38 U/L (ref 0–44)
AST: 44 U/L — ABNORMAL HIGH (ref 15–41)
Albumin: 3 g/dL — ABNORMAL LOW (ref 3.5–5.0)
Alkaline Phosphatase: 370 U/L — ABNORMAL HIGH (ref 38–126)
Anion gap: 5 (ref 5–15)
BUN: 8 mg/dL (ref 8–23)
CO2: 29 mmol/L (ref 22–32)
Calcium: 8.9 mg/dL (ref 8.9–10.3)
Chloride: 107 mmol/L (ref 98–111)
Creatinine, Ser: 0.71 mg/dL (ref 0.44–1.00)
GFR, Estimated: >60 mL/min (ref >60)
Glucose, Bld: 121 mg/dL — ABNORMAL HIGH (ref 70–99)
Potassium: 3.7 mmol/L (ref 3.5–5.1)
Sodium: 141 mmol/L (ref 135–145)
Total Bilirubin: 0.4 mg/dL (ref 0.3–1.2)
Total Protein: 6.9 g/dL (ref 6.5–8.1)

## 2020-10-11 LAB — CBC WITH DIFFERENTIAL/PLATELET
Abs Immature Granulocytes: 0.01 10*3/uL (ref 0.00–0.07)
Basophils Absolute: 0 10*3/uL (ref 0.0–0.1)
Basophils Relative: 1 %
Eosinophils Absolute: 0.1 10*3/uL (ref 0.0–0.5)
Eosinophils Relative: 3 %
HCT: 29.4 % — ABNORMAL LOW (ref 36.0–46.0)
Hemoglobin: 10 g/dL — ABNORMAL LOW (ref 12.0–15.0)
Immature Granulocytes: 0 %
Lymphocytes Relative: 41 %
Lymphs Abs: 1.2 10*3/uL (ref 0.7–4.0)
MCH: 35.8 pg — ABNORMAL HIGH (ref 26.0–34.0)
MCHC: 34 g/dL (ref 30.0–36.0)
MCV: 105.4 fL — ABNORMAL HIGH (ref 80.0–100.0)
Monocytes Absolute: 0.5 10*3/uL (ref 0.1–1.0)
Monocytes Relative: 16 %
Neutro Abs: 1.1 10*3/uL — ABNORMAL LOW (ref 1.7–7.7)
Neutrophils Relative %: 39 %
Platelets: 305 10*3/uL (ref 150–400)
RBC: 2.79 MIL/uL — ABNORMAL LOW (ref 3.87–5.11)
RDW: 17 % — ABNORMAL HIGH (ref 11.5–15.5)
WBC: 2.8 10*3/uL — ABNORMAL LOW (ref 4.0–10.5)
nRBC: 0 % (ref 0.0–0.2)

## 2020-10-11 MED ORDER — TOPOTECAN HCL CHEMO INJECTION 4 MG
3.0000 mg/m2 | Freq: Once | INTRAVENOUS | Status: AC
  Administered 2020-10-11: 6.4 mg via INTRAVENOUS
  Filled 2020-10-11: qty 6.4

## 2020-10-11 MED ORDER — SODIUM CHLORIDE 0.9 % IV SOLN
Freq: Once | INTRAVENOUS | Status: AC
  Filled 2020-10-11: qty 250

## 2020-10-11 MED ORDER — SODIUM CHLORIDE 0.9% FLUSH
10.0000 mL | Freq: Once | INTRAVENOUS | Status: AC
  Administered 2020-10-11: 10 mL
  Filled 2020-10-11: qty 10

## 2020-10-11 MED ORDER — HEPARIN SOD (PORK) LOCK FLUSH 100 UNIT/ML IV SOLN
500.0000 [IU] | Freq: Once | INTRAVENOUS | Status: AC | PRN
  Administered 2020-10-11: 500 [IU]
  Filled 2020-10-11: qty 5

## 2020-10-11 MED ORDER — PROCHLORPERAZINE MALEATE 10 MG PO TABS
ORAL_TABLET | ORAL | Status: AC
  Filled 2020-10-11: qty 1

## 2020-10-11 MED ORDER — SODIUM CHLORIDE 0.9% FLUSH
10.0000 mL | INTRAVENOUS | Status: DC | PRN
  Administered 2020-10-11: 10 mL
  Filled 2020-10-11: qty 10

## 2020-10-11 MED ORDER — PROCHLORPERAZINE MALEATE 10 MG PO TABS
10.0000 mg | ORAL_TABLET | Freq: Once | ORAL | Status: AC
  Administered 2020-10-11: 10 mg via ORAL

## 2020-10-11 NOTE — Patient Instructions (Signed)
[facility] Discharge Instructions for Patients Receiving Chemotherapy  Today you received the following chemotherapy agents: topotecan.  To help prevent nausea and vomiting after your treatment, we encourage you to take your nausea medication as directed.   If you develop nausea and vomiting that is not controlled by your nausea medication, call the clinic.   BELOW ARE SYMPTOMS THAT SHOULD BE REPORTED IMMEDIATELY:  *FEVER GREATER THAN 100.5 F  *CHILLS WITH OR WITHOUT FEVER  NAUSEA AND VOMITING THAT IS NOT CONTROLLED WITH YOUR NAUSEA MEDICATION  *UNUSUAL SHORTNESS OF BREATH  *UNUSUAL BRUISING OR BLEEDING  TENDERNESS IN MOUTH AND THROAT WITH OR WITHOUT PRESENCE OF ULCERS  *URINARY PROBLEMS  *BOWEL PROBLEMS  UNUSUAL RASH Items with * indicate a potential emergency and should be followed up as soon as possible.  Feel free to call the clinic should you have any questions or concerns. The clinic phone number is [phone].  Please show the CHEMO ALERT CARD at check-in to the Emergency Department and triage nurse.   

## 2020-10-12 LAB — CA 125: Cancer Antigen (CA) 125: 1874 U/mL — ABNORMAL HIGH (ref 0.0–38.1)

## 2020-10-18 ENCOUNTER — Inpatient Hospital Stay: Payer: Medicare (Managed Care)

## 2020-10-18 ENCOUNTER — Other Ambulatory Visit: Payer: Self-pay

## 2020-10-18 ENCOUNTER — Encounter: Payer: Self-pay | Admitting: Hematology and Oncology

## 2020-10-18 ENCOUNTER — Inpatient Hospital Stay (HOSPITAL_BASED_OUTPATIENT_CLINIC_OR_DEPARTMENT_OTHER): Payer: Medicare (Managed Care) | Admitting: Hematology and Oncology

## 2020-10-18 ENCOUNTER — Telehealth: Payer: Self-pay | Admitting: Hematology and Oncology

## 2020-10-18 VITALS — BP 113/57 | HR 84 | Temp 98.1°F | Resp 18 | Ht 70.0 in | Wt 198.4 lb

## 2020-10-18 DIAGNOSIS — R4702 Dysphasia: Secondary | ICD-10-CM

## 2020-10-18 DIAGNOSIS — C562 Malignant neoplasm of left ovary: Secondary | ICD-10-CM

## 2020-10-18 DIAGNOSIS — D61818 Other pancytopenia: Secondary | ICD-10-CM

## 2020-10-18 DIAGNOSIS — R748 Abnormal levels of other serum enzymes: Secondary | ICD-10-CM | POA: Insufficient documentation

## 2020-10-18 DIAGNOSIS — C563 Malignant neoplasm of bilateral ovaries: Secondary | ICD-10-CM

## 2020-10-18 DIAGNOSIS — Z95828 Presence of other vascular implants and grafts: Secondary | ICD-10-CM

## 2020-10-18 DIAGNOSIS — Z5111 Encounter for antineoplastic chemotherapy: Secondary | ICD-10-CM | POA: Diagnosis not present

## 2020-10-18 LAB — CBC WITH DIFFERENTIAL/PLATELET
Abs Immature Granulocytes: 0.01 10*3/uL (ref 0.00–0.07)
Basophils Absolute: 0 10*3/uL (ref 0.0–0.1)
Basophils Relative: 1 %
Eosinophils Absolute: 0.1 10*3/uL (ref 0.0–0.5)
Eosinophils Relative: 2 %
HCT: 28.5 % — ABNORMAL LOW (ref 36.0–46.0)
Hemoglobin: 9.7 g/dL — ABNORMAL LOW (ref 12.0–15.0)
Immature Granulocytes: 0 %
Lymphocytes Relative: 51 %
Lymphs Abs: 1.5 10*3/uL (ref 0.7–4.0)
MCH: 36.2 pg — ABNORMAL HIGH (ref 26.0–34.0)
MCHC: 34 g/dL (ref 30.0–36.0)
MCV: 106.3 fL — ABNORMAL HIGH (ref 80.0–100.0)
Monocytes Absolute: 0.4 10*3/uL (ref 0.1–1.0)
Monocytes Relative: 14 %
Neutro Abs: 0.9 10*3/uL — ABNORMAL LOW (ref 1.7–7.7)
Neutrophils Relative %: 32 %
Platelets: 252 10*3/uL (ref 150–400)
RBC: 2.68 MIL/uL — ABNORMAL LOW (ref 3.87–5.11)
RDW: 16.3 % — ABNORMAL HIGH (ref 11.5–15.5)
WBC: 2.9 10*3/uL — ABNORMAL LOW (ref 4.0–10.5)
nRBC: 0 % (ref 0.0–0.2)

## 2020-10-18 LAB — COMPREHENSIVE METABOLIC PANEL
ALT: 32 U/L (ref 0–44)
AST: 41 U/L (ref 15–41)
Albumin: 3 g/dL — ABNORMAL LOW (ref 3.5–5.0)
Alkaline Phosphatase: 400 U/L — ABNORMAL HIGH (ref 38–126)
Anion gap: 8 (ref 5–15)
BUN: 9 mg/dL (ref 8–23)
CO2: 27 mmol/L (ref 22–32)
Calcium: 9.2 mg/dL (ref 8.9–10.3)
Chloride: 105 mmol/L (ref 98–111)
Creatinine, Ser: 0.73 mg/dL (ref 0.44–1.00)
GFR, Estimated: >60 mL/min (ref >60)
Glucose, Bld: 96 mg/dL (ref 70–99)
Potassium: 3.7 mmol/L (ref 3.5–5.1)
Sodium: 140 mmol/L (ref 135–145)
Total Bilirubin: 0.6 mg/dL (ref 0.3–1.2)
Total Protein: 7 g/dL (ref 6.5–8.1)

## 2020-10-18 MED ORDER — SODIUM CHLORIDE 0.9% FLUSH
10.0000 mL | Freq: Once | INTRAVENOUS | Status: AC
  Administered 2020-10-18: 10 mL
  Filled 2020-10-18: qty 10

## 2020-10-18 NOTE — Assessment & Plan Note (Signed)
She has expressive dysphagia from prior history of stroke I recommend her daughter to come with her for doctor's visit appointment in the future to facilitate accurate communication

## 2020-10-18 NOTE — Progress Notes (Signed)
[facility] OFFICE PROGRESS NOTE  Patient Care Team: Helane Rima, MD as PCP - General (Family Medicine) Lonia Mad, MD as Referring Physician (Neurology) Cameron Sprang, MD as Consulting Physician (Neurology)  ASSESSMENT & PLAN:  Ovarian cancer [facility]) She is not doing well She has some persistent nausea She is profoundly pancytopenic Her recent tumor marker is profoundly elevated and she had elevated alkaline phosphatase I suspect she have disease progression I recommend we hold treatment today and repeat CT imaging next week for further follow-up She is in agreement  Pancytopenia, acquired [facility]) She has profound pancytopenia despite multiple dose adjustment With alkaline phosphatase elevation, I suspect she have bony metastasis We will hold treatment today She does not need transfusion support  Elevated liver enzymes She has profound elevated liver enzymes with elevated alkaline phosphatase but not much with AST or ALT I suspect this could be either due to progressive peritoneal disease or new bony metastasis As above, we will hold treatment and repeat imaging study  Expressive dysphasia She has expressive dysphagia from prior history of stroke I recommend her daughter to come with her for doctor's visit appointment in the future to facilitate accurate communication   Orders Placed This Encounter  Procedures  . CT ABDOMEN PELVIS W CONTRAST    Standing Status:   Future    Standing Expiration Date:   10/18/2021    Order Specific Question:   If indicated for the ordered procedure, I authorize the administration of contrast media per Radiology protocol    Answer:   Yes    Order Specific Question:   Preferred imaging location?    Answer:   [facility]    Order Specific Question:   Radiology Contrast Protocol - do NOT remove file path    Answer:   [email or web address]    All questions were answered. The  patient knows to call the clinic with any problems, questions or concerns. The total time spent in the appointment was 30 minutes encounter with patients including review of chart and various tests results, discussions about plan of care and coordination of care plan   Heath Lark, MD 10/18/2020 12:29 PM  INTERVAL HISTORY: Please see below for problem oriented charting. She returns with her daughter for further follow-up She complained of some nausea but no vomiting Denies recent changes in bowel habits No recent infection, fever or chills Her chronic pain is stable  SUMMARY OF ONCOLOGIC HISTORY: Oncology History Overview Note  Negative genetic testing Progressed on Niraparib, gemzar and carboplatin   Carcinomatosis ([place])  12/06/2015 Initial Diagnosis   Carcinomatosis ([place])   07/18/2020 - 08/30/2020 Chemotherapy   The patient had carboplatin for chemotherapy treatment.     09/20/2020 -  Chemotherapy   The patient had topotecan (HYCAMTIN) 6.8 mg in sodium chloride 0.9 % 100 mL chemo infusion, 3.2 mg/m2 = 6.8 mg (80 % of original dose 4 mg/m2), Intravenous,  Once, 2 of 4 cycles Dose modification: 3.2 mg/m2 (80 % of original dose 4 mg/m2, Cycle 1, Reason: Provider Judgment), 3 mg/m2 (75 % of original dose 4 mg/m2, Cycle 1, Reason: Dose Not Tolerated) Administration: 6.8 mg (09/20/2020), 6.4 mg (09/27/2020), 6.4 mg (10/11/2020)  for chemotherapy treatment.    Ovarian cancer, bilateral  12/24/2015 Initial Diagnosis   Ovarian cancer, bilateral ([place])   07/18/2020 - 08/30/2020 Chemotherapy   The patient had carboplatin for chemotherapy treatment.     09/20/2020 -  Chemotherapy   The patient had topotecan (HYCAMTIN) 6.8 mg  in sodium chloride 0.9 % 100 mL chemo infusion, 3.2 mg/m2 = 6.8 mg (80 % of original dose 4 mg/m2), Intravenous,  Once, 2 of 4 cycles Dose modification: 3.2 mg/m2 (80 % of original dose 4 mg/m2, Cycle 1, Reason: Provider Judgment), 3 mg/m2 (75 % of original dose 4 mg/m2,  Cycle 1, Reason: Dose Not Tolerated) Administration: 6.8 mg (09/20/2020), 6.4 mg (09/27/2020), 6.4 mg (10/11/2020)  for chemotherapy treatment.    Ovarian cancer ([place])  11/28/2015 Imaging   Ct scan of abdomen: Peritoneal carcinomatosis and pelvic/inguinal adenopathy. Gynecologic primary is favored.   12/17/2015 Pathology Results   Lymph node, needle/core biopsy, L inguinal LAN - METASTATIC ADENOCARCINOMA. Microscopic Comment Immunohistochemistry will be performed and reported as an addendum. (JDP:ecj 12/18/2015) ADDENDUM: Immunohistochemistry shows strong positivity with cytokeratin 7, estrogen receptor (ER), progesterone receptor (PR), p53 and WT1. Negative markers are cytokeratin 20 , CDX- 2 and gross cystic disease fluid protein. 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 (JDP:kh 12-19-15   12/17/2015 Procedure   Technically successful ultrasound guided core left inguinal adenopathy biopsy   12/24/2015 Tumor Marker   Patient's tumor was tested for the following markers: CA-125 Results of the tumor marker test revealed 608.2   12/27/2015 - 02/28/2016 Chemotherapy   She received neoadjuvant chemo x 3 cycles   01/01/2016 Tumor Marker   Patient's tumor was tested for the following markers: CA-125 Results of the tumor marker test revealed 741.6   01/29/2016 Procedure   Successful placement of a right internal jugular approach power injectable Port-A-Cath. The catheter is ready for immediate use   01/29/2016 Imaging   US abdomen 1. Hyperechoic 2.5 x 1.0 x 1.6 cm lesion in the region the caudate lobe of the liver. Similar finding noted on prior CT of 11/28/2015. This could represent hemangioma. This could represent a malignancy including metastatic disease. 2. Exam otherwise unremarkable. No gallstones. No biliary distention.   02/03/2016 Tumor Marker   Patient's tumor was tested for the following  markers: CA-125 Results of the tumor marker test revealed 137.1   02/20/2016 Imaging   MRI brain No acute infarct.  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  No intracranial mass.  Moderate small vessel disease changes.  Global atrophy without hydrocephalus.  Expanded partially empty sella without secondary findings of pseudotumor cerebri.  Minimal mucosal thickening ethmoid sinus air cells. Small air-fluid levels maxillary sinuses bilaterally may indicate changes of acute sinusitis.    02/28/2016 Tumor Marker   Patient's tumor was tested for the following markers: CA-125 Results of the tumor marker test revealed 33.6   03/02/2016 Imaging   CT chest, abdomen and pelvis 1. Today's study demonstrates a positive response to therapy with resolution of the previously noted malignant ascites, significant regression of previously noted peritoneal implants, and regression of previously noted lymphadenopathy in the abdomen and pelvis. 2. No definite evidence to suggest metastatic disease to the thorax. 3. Stable 1.0 x 1.7 cm intermediate attenuation lesion associated with the posterior aspect of segment 1 of the liver, favored to represent a mildly proteinaceous hepatic cyst. The possibility of a peritoneal implant in this region is not entirely excluded, but is not favored on today's examination. 4. Extensive post infectious scarring inguinal upper right lung, likely sequela of prior necrotizing pneumonia. 5. Multiple tiny pulmonary nodules scattered throughout the lungs bilaterally all measuring 4 mm or less. These are nonspecific, but favored to be benign. Given the patient's  history of primary gynecologic malignancy, attention on followup studies is recommended to ensure the stability of these findings. 6. Additional incidental findings, as above   03/14/2016 Imaging   CT angiogram 1. No pulmonary emboli. 2. Chronic changes to the right upper lobe. 3. Stable  lesion in the caudate lobe of the liver. 4. Stable soft tissue prominence to the right of the trachea as described above. 5. Stable small nodule in the right lung.    03/24/2016 Pathology Results   1. Omentum, resection for tumor - FOCAL ADENOCARCINOMA ASSOCIATED WITH EXTENSIVE FIBROSIS, INFLAMMATION AND HEMOSIDERIN DEPOSITION. 2. Uterus +/- tubes/ovaries, neoplastic, cervix - CERVIX: SLIGHT CERVICITIS AND SQUAMOUS METAPLASIA. - ENDOMETRIUM: ATROPHIC, NO HYPERPLASIA OR MALIGNANCY. - MYOMETRIUM: LEIOMYOMATA WITH DEGENERATIVE CHANGES. NO MALIGNANCY. - UTERINE SEROSA: FOCAL ADENOCARCINOMA ASSOCIATED WITH ADHESIONS. - RIGHT OVARY: BENIGN CALCIFIED NODULE AND BENIGN SEROUS CYST. NO MALIGNANCY IDENTIFIED. - RIGHT FALLOPIAN TUBE: UNREMARKABLE. NO MALIGNANCY IDENTIFIED. - LEFT OVARY: FOCAL ADENOCARCINOMA ASSOCIATED WITH EXTENSIVE FIBROSIS. - LEFT FALLOPIAN TUBE: HYDROSALPINX. NO MALIGNANCY IDENTIFIED. 3. Lymph node, biopsy, right external iliac - METASTATIC ADENOCARCINOMA, 4. Soft tissue, biopsy, left para colic gutter - FOCAL ADENOCARCINOMA ASSOCIATED WITH FIBROSIS. Microscopic Comment 2. OVARY Specimen(s): Uterus with bilateral ovaries, omentum, right external iliac lymph node and left paracolic gutter. Procedure: (including lymph node sampling) Hysterectomy with bilateral salpingo-oophorectomy, omentectomy, lymph node biopsy and paracolic gutter biopsy. Primary tumor site (including laterality): Left ovary. Ovarian surface involvement: Yes Ovarian capsule intact without fragmentation: N/A Maximum tumor size (cm): 2 cm, 2 cm Histologic type: Serous carcinoma. Grade: 3 Peritoneal implants: (specify invasive or non-invasive): Left paracolic gutter positive for carcinoma Pelvic extension (list additional structures on separate lines and if involved): Omentum, right paracolic gutter and uterine serosa. Lymph nodes: number examined 1 ; number positive 1 TNM code: ypT3a, ypN1b, ypMX FIGO Stage  (based on pathologic findings, needs clinical correlation): IIIA2 Comments: The left ovary has a 2 cm nodule, which is composed primarily of fibrous tissue with small foci of residual adenocarcinoma. There is also microscopic involvement of the uterine serosa, the left paracolic gutter biopsy and the omentum. The left paracolic gutter and omental involvement is microscopic and both are associated with extensive fibrosis with focal hemosiderin deposition. The right external iliac node is completely replaced by metastatic adenocarcinoma with serous features. (JDP:ecj 03/30/2016)   03/24/2016 Surgery   Operation: Robotic-assisted laparoscopic total hysterectomy with bilateral salpingoophorectomy, omentectomy, radical tumor debulking  Surgeon: Donaciano Eva  Operative Findings:  : small nodules in omentum, no ascites. Omental nodule (2cm) adherent to lower anterior abdominal wall. 2cm left colonic gutter cystic nodule. 6cm right external iliac lymph node. Grossly normal ovaries and tubes. Fibroid uterus. No gross residual disease at completion of surgery representing R0 optimal/complete resection.    04/17/2016 Imaging   CT abdomen and pelvis 1. There is no evidence of acute inflammatory process within abdomen or pelvis. 2. Stable low-density lesion within caudate lobe of the liver. No new hepatic lesions are noted. 3. Again noted lobulated renal contour and multifocal renal cortical scarring. No hydronephrosis or hydroureter. 4. No significant mesenteric adenopathy. No retroperitoneal adenopathy. Stable minimal residual peritoneal thickening within pelvis. No new pelvic implants or pelvic ascites. 5. Status post hysterectomy.  Unremarkable urinary bladder. 6. Moderate stool within colon as described above. No evidence of colitis.    04/24/2016 - 06/19/2016 Chemotherapy   She received 3 more cycles of chemo after surgery   05/14/2016 Tumor Marker   Patient's tumor was tested for the  following  markers: CA-125 Results of the tumor marker test revealed 22.5   06/04/2016 Tumor Marker   Patient's tumor was tested for the following markers: CA-125 Results of the tumor marker test revealed 13.7   07/07/2016 Genetic Testing   Patient has genetic testing done for breast/ovasrian cancer panel Results revealed patient has no actionable mutation   07/23/2016 Imaging   Ct abdomen and pelvis 1. Heterogeneously enhancing 4.9 x 3.2 x 4.4 cm mass along the right pelvic sidewall may represent locally recurrent disease or malignant lymphadenopathy. 2. No other signs of definite metastatic disease noted elsewhere in the abdomen or pelvis. 3. Stable low-attenuation hepatic lesion in the caudate lobe of the liver, which appears to demonstrates some progressive centripetal filling on delayed images. This lesion is presumably benign given its stability compared to prior studies, and is favored to represent a small cavernous hemangioma. 4. Cardiomegaly with biatrial dilatation, which is very severe on the right side. 5. Aortic atherosclerosis. 6. Additional incidental findings, as above.   07/23/2016 Tumor Marker   Patient's tumor was tested for the following markers: CA-125 Results of the tumor marker test revealed 12.3   09/30/2016 Imaging   Ct abdomen and pelvis: 1. 4.9 cm heterogeneously enhancing mass seen along the right pelvic sidewall previously has almost completely resolved. There is some ill-defined soft tissue attenuation/peritoneal thickening in this region today but no discrete measurable lesion is evident. 2. No new or progressive findings on today's exam. 3. Stable hypo attenuating lesion in the caudate lobe of the liver. 4. Subtle aortic atherosclerosis   09/30/2016 Tumor Marker   Patient's tumor was tested for the following markers: CA-125 Results of the tumor marker test revealed 9.6   10/05/2016 Pathology Results   Vagina, biopsy, left cuff - BENIGN FIBROEPITHELIAL  (STROMAL) POLYP. - NO DYSPLASIA, ATYPIA OR MALIGNANCY IDENTIFIED.   01/14/2017 Tumor Marker   Patient's tumor was tested for the following markers: CA-125 Results of the tumor marker test revealed 11.9   05/05/2017 Tumor Marker   Patient's tumor was tested for the following markers: CA-125 Results of the tumor marker test revealed 28   06/14/2017 Tumor Marker   Patient's tumor was tested for the following markers: CA-125 Results of the tumor marker test revealed 45.7   06/24/2017 Imaging   Ct abdomen and pelvis: Increased peritoneal metastatic disease in abdomen and pelvis since prior exam. No evidence of ascites. Stable small benign hepatic hemangioma.   07/21/2017 Tumor Marker   Patient's tumor was tested for the following markers: CA-125 Results of the tumor marker test revealed 84.1   07/21/2017 - 11/03/2017 Chemotherapy   She received carboplatin and taxol   08/11/2017 Adverse Reaction   She had severe neuropathy due to treatment. Does of chemo is reduced starting cycle 2 onwards   09/02/2017 Tumor Marker   Patient's tumor was tested for the following markers: CA-125 Results of the tumor marker test revealed 27.7   09/16/2017 Imaging   CT CHEST IMPRESSION  1. No acute process or evidence of metastatic disease in the chest. 2. Right upper lung bronchiectasis, consolidation, and architectural distortion are most likely post infectious or inflammatory and not significantly changed. 3. Aortic Atherosclerosis (ICD10-I70.0).  CT ABDOMEN AND PELVIS IMPRESSION  1. Response to therapy of nodal and peritoneal metastasis. 2. No new or progressive disease. 3. Caudate lobe hemangioma, as before. 4. Bilateral renal cortical thinning. Similar minimal right-sided caliectasis and hydroureter. Likely secondary to low-grade obstruction by the dominant right pelvic implant.   09/22/2017 Tumor  Marker   Patient's tumor was tested for the following markers: CA-125 Results of the tumor marker  test revealed 18   11/03/2017 Tumor Marker   Patient's tumor was tested for the following markers: CA-125 Results of the tumor marker test revealed 13.4   11/25/2017 Imaging   1. Stable to slight interval decrease in size of nodal and peritoneal metastatic disease within the abdomen. 2. No new or progressive disease.   12/03/2017 - 09/11/2019 Chemotherapy   The patient is prescribed Ze[place]ula   01/17/2018 Tumor Marker   Patient's tumor was tested for the following markers: CA-125 Results of the tumor marker test revealed 11.9   02/22/2018 Tumor Marker   Patient's tumor was tested for the following markers: CA-125 Results of the tumor marker test revealed 11.7   02/22/2018 Imaging   1. Improved nodular thickening along the vaginal cuff on the left and medial to the right iliac vessels, likely treated implants. No evidence of progressive peritoneal disease. 2. Stable hepatic hemangioma. No evidence of solid visceral organ metastases. 3. Bilateral renal cortical scarring.   05/19/2018 Tumor Marker   Patient's tumor was tested for the following markers: CA-125 Results of the tumor marker test revealed 9.9   08/10/2018 Imaging   Stable mild nodular thickening of the left vaginal cuff. No new or progressive disease within the abdomen or pelvis.  Stable small hepatic hemangioma.   09/21/2018 Tumor Marker   Patient's tumor was tested for the following markers: CA-125 Results of the tumor marker test revealed 10.3   11/11/2018 Tumor Marker   Patient's tumor was tested for the following markers: CA-125 Results of the tumor marker test revealed 10.5   12/15/2018 Tumor Marker   Patient's tumor was tested for the following markers: CA-125 Results of the tumor marker test revealed 12.6   01/06/2019 Tumor Marker   Patient's tumor was tested for the following markers: CA-125 Results of the tumor marker test revealed 14.6   01/12/2019 Imaging   1. Chronic extensive right upper lobe scarring  changes and bronchiectasis. 2. No mediastinal or hilar mass or adenopathy and no findings for pulmonary metastatic disease. 3. Stable caudate lobe hemangioma. 4. No worrisome omental or peritoneal surface lesions. 5. Stable pericecal and small pelvic lymph nodes. No new or progressive findings.    09/11/2019 Imaging   Ct abdomen and pelvis Pericecal peritoneal metastatic implants   09/19/2019 - 07/04/2020 Chemotherapy   The patient had carboplatin and gemxar for chemotherapy treatment, then on gemzar maintenance since 11/28/2019    11/20/2019 Imaging   1. Mild decrease in size of peritoneal tumor implant along the lateral aspect of the cecum. 2. Stable tiny sub-cm mesenteric soft tissue nodules or lymph nodes in right lower quadrant and anterior pelvic mesentery. 3. No new or progressive disease identified within the abdomen or pelvis.   03/12/2020 Imaging   1. No acute findings within the abdomen or pelvis. 2. Stable appearance of right lower quadrant peritoneal implant adjacent to cecum. 3. Increase in size of right lower quadrant ileocolic lymph node. 1.2 cm on today's study versus 0.8 previously. 4. Possible early avascular necrosis involving the left femoral head.   Aortic Atherosclerosis (ICD10-I70.0).   07/04/2020 Tumor Marker   Patient's tumor was tested for the following markers: CA-125 Results of the tumor marker test revealed 73   07/09/2020 Imaging   1. Enlarging noncalcified implants in the ileocolonic mesentery, suspicious for metastatic disease in this patient with a rising serum CA 125 level. There is a  questionable new peritoneal implant associated with the distal small bowel. No resulting bowel obstruction. 2. No evidence of visceral metastatic disease. 3. Aortic Atherosclerosis (ICD10-I70.0).   07/18/2020 - 08/30/2020 Chemotherapy   The patient had carboplatin for chemotherapy treatment.     07/18/2020 Tumor Marker   Patient's tumor was tested for the following  markers: CA-125 Results of the tumor marker test revealed 123   08/08/2020 Tumor Marker   Patient's tumor was tested for the following markers: CA-125 Results of the tumor marker test revealed 231   08/30/2020 Tumor Marker   Patient's tumor was tested for the following markers: CA-125 Results of the tumor marker test revealed 635   09/12/2020 Imaging   Mild increase in size of peritoneal metastasis in the right pelvis. Other small peritoneal nodules in right lower quadrant mesentery remain stable.    Stable soft tissue prominence involving the left vaginal cuff, suspicious for tumor.   No evidence of metastatic disease within the chest.     09/20/2020 -  Chemotherapy   The patient had topotecan (HYCAMTIN) 6.8 mg in sodium chloride 0.9 % 100 mL chemo infusion, 3.2 mg/m2 = 6.8 mg (80 % of original dose 4 mg/m2), Intravenous,  Once, 2 of 4 cycles Dose modification: 3.2 mg/m2 (80 % of original dose 4 mg/m2, Cycle 1, Reason: Provider Judgment), 3 mg/m2 (75 % of original dose 4 mg/m2, Cycle 1, Reason: Dose Not Tolerated) Administration: 6.8 mg (09/20/2020), 6.4 mg (09/27/2020), 6.4 mg (10/11/2020)  for chemotherapy treatment.    09/20/2020 Tumor Marker   Patient's tumor was tested for the following markers: CA-125 Results of the tumor marker test revealed 1288   10/11/2020 Tumor Marker   Patient's tumor was tested for the following markers: CA-125 Results of the tumor marker test revealed 1874     REVIEW OF SYSTEMS:   Constitutional: Denies fevers, chills or abnormal weight loss Eyes: Denies blurriness of vision Ears, nose, mouth, throat, and face: Denies mucositis or sore throat Respiratory: Denies cough, dyspnea or wheezes Cardiovascular: Denies palpitation, chest discomfort or lower extremity swelling Skin: Denies abnormal skin rashes Lymphatics: Denies new lymphadenopathy or easy bruising Neurological:Denies numbness, tingling or new weaknesses Behavioral/Psych: Mood is stable, no  new changes  All other systems were reviewed with the patient and are negative.  I have reviewed the past medical history, past surgical history, social history and family history with the patient and they are unchanged from previous note.  ALLERGIES:  is allergic to codeine, lactulose, and latex.  MEDICATIONS:  Current Outpatient Medications  Medication Sig Dispense Refill  . apixaban (ELIQUIS) 5 MG TABS tablet Take 1 tablet (5 mg total) by mouth 2 (two) times daily. 60 tablet 5  . atorvastatin (LIPITOR) 40 MG tablet Take 40 mg by mouth at bedtime.    . gabapentin (NEURONTIN) 600 MG tablet TAKE 1 TABLET(600 MG) BY MOUTH TWICE DAILY 60 tablet 11  . lidocaine-prilocaine (EMLA) cream Apply to Porta-Cath 1-2 hours prior to access as directed. 30 g 1  . metoprolol succinate (TOPROL-XL) 25 MG 24 hr tablet Take 25 mg by mouth daily.   5  . Multiple Vitamin (MULTIVITAMIN WITH MINERALS) TABS tablet Take 1 tablet by mouth daily.    Marland Kitchen omega-3 acid ethyl esters (LOVAZA) 1 g capsule Take 1 g by mouth daily.    . ondansetron (ZOFRAN ODT) 4 MG disintegrating tablet Take 1 tablet (4 mg total) by mouth every 8 (eight) hours as needed for nausea or vomiting. 60 tablet 9  .  Oxycodone HCl 10 MG TABS Take 1 tablet (10 mg total) by mouth every 6 (six) hours as needed. 60 tablet 0  . PARoxetine (PAXIL) 40 MG tablet Take 40 mg by mouth daily.  0  . polyethylene glycol (MIRALAX / GLYCOLAX) packet Take 17 g by mouth daily.     No current facility-administered medications for this visit.    PHYSICAL EXAMINATION: ECOG PERFORMANCE STATUS: 2 - Symptomatic, <50% confined to bed  Vitals:   10/18/20 1019  BP: (!) 113/57  Pulse: 84  Resp: 18  Temp: 98.1 F (36.7 C)  SpO2: 100%   Filed Weights   10/18/20 1019  Weight: 198 lb 6.4 oz (90 kg)    GENERAL:alert, no distress and comfortable NEURO: alert & oriented x 3 with dysphasia   LABORATORY DATA:  I have reviewed the data as listed    Component Value  Date/Time   NA 140 10/18/2020 1006   NA 142 12/08/2017 1051   K 3.7 10/18/2020 1006   K 4.4 12/08/2017 1051   CL 105 10/18/2020 1006   CL 105 06/14/2017 1430   CO2 27 10/18/2020 1006   CO2 28 12/08/2017 1051   GLUCOSE 96 10/18/2020 1006   GLUCOSE 97 12/08/2017 1051   BUN 9 10/18/2020 1006   BUN 16.2 12/08/2017 1051   CREATININE 0.73 10/18/2020 1006   CREATININE 0.72 07/18/2020 1141   CREATININE 0.9 12/08/2017 1051   CALCIUM 9.2 10/18/2020 1006   CALCIUM 9.5 12/08/2017 1051   PROT 7.0 10/18/2020 1006   PROT 6.9 12/08/2017 1051   ALBUMIN 3.0 (L) 10/18/2020 1006   ALBUMIN 3.6 12/08/2017 1051   AST 41 10/18/2020 1006   AST 26 07/18/2020 1141   AST 18 12/08/2017 1051   ALT 32 10/18/2020 1006   ALT 25 07/18/2020 1141   ALT 20 12/08/2017 1051   ALKPHOS 400 (H) 10/18/2020 1006   ALKPHOS 131 12/08/2017 1051   BILITOT 0.6 10/18/2020 1006   BILITOT 0.5 07/18/2020 1141   BILITOT 0.70 12/08/2017 1051   GFRNONAA >60 10/18/2020 1006   GFRNONAA >60 07/18/2020 1141   GFRAA >60 08/30/2020 1105   GFRAA >60 07/18/2020 1141    No results found for: SPEP, UPEP  Lab Results  Component Value Date   WBC 2.9 (L) 10/18/2020   NEUTROABS 0.9 (L) 10/18/2020   HGB 9.7 (L) 10/18/2020   HCT 28.5 (L) 10/18/2020   MCV 106.3 (H) 10/18/2020   PLT 252 10/18/2020      Chemistry      Component Value Date/Time   NA 140 10/18/2020 1006   NA 142 12/08/2017 1051   K 3.7 10/18/2020 1006   K 4.4 12/08/2017 1051   CL 105 10/18/2020 1006   CL 105 06/14/2017 1430   CO2 27 10/18/2020 1006   CO2 28 12/08/2017 1051   BUN 9 10/18/2020 1006   BUN 16.2 12/08/2017 1051   CREATININE 0.73 10/18/2020 1006   CREATININE 0.72 07/18/2020 1141   CREATININE 0.9 12/08/2017 1051      Component Value Date/Time   CALCIUM 9.2 10/18/2020 1006   CALCIUM 9.5 12/08/2017 1051   ALKPHOS 400 (H) 10/18/2020 1006   ALKPHOS 131 12/08/2017 1051   AST 41 10/18/2020 1006   AST 26 07/18/2020 1141   AST 18 12/08/2017 1051    ALT 32 10/18/2020 1006   ALT 25 07/18/2020 1141   ALT 20 12/08/2017 1051   BILITOT 0.6 10/18/2020 1006   BILITOT 0.5 07/18/2020 1141   BILITOT 0.70  12/08/2017 1051

## 2020-10-18 NOTE — Assessment & Plan Note (Signed)
She is not doing well She has some persistent nausea She is profoundly pancytopenic Her recent tumor marker is profoundly elevated and she had elevated alkaline phosphatase I suspect she have disease progression I recommend we hold treatment today and repeat CT imaging next week for further follow-up She is in agreement

## 2020-10-18 NOTE — Telephone Encounter (Signed)
Scheduled appointment per 11/12 sch msg. Spoke to patient who is aware of appointment date and time.

## 2020-10-18 NOTE — Assessment & Plan Note (Signed)
She has profound elevated liver enzymes with elevated alkaline phosphatase but not much with AST or ALT I suspect this could be either due to progressive peritoneal disease or new bony metastasis As above, we will hold treatment and repeat imaging study

## 2020-10-18 NOTE — Assessment & Plan Note (Signed)
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

## 2020-10-28 ENCOUNTER — Inpatient Hospital Stay (HOSPITAL_BASED_OUTPATIENT_CLINIC_OR_DEPARTMENT_OTHER): Payer: Medicare (Managed Care) | Admitting: Hematology and Oncology

## 2020-10-28 ENCOUNTER — Ambulatory Visit (HOSPITAL_COMMUNITY)
Admission: RE | Admit: 2020-10-28 | Discharge: 2020-10-28 | Disposition: A | Discharge status: Home / Self Care | Payer: Medicare (Managed Care) | Source: Ambulatory Visit | Attending: Hematology and Oncology | Admitting: Hematology and Oncology

## 2020-10-28 ENCOUNTER — Encounter (HOSPITAL_COMMUNITY): Payer: Self-pay

## 2020-10-28 ENCOUNTER — Encounter: Payer: Self-pay | Admitting: Hematology and Oncology

## 2020-10-28 ENCOUNTER — Other Ambulatory Visit: Payer: Self-pay

## 2020-10-28 DIAGNOSIS — D61818 Other pancytopenia: Secondary | ICD-10-CM | POA: Diagnosis not present

## 2020-10-28 DIAGNOSIS — Z7189 Other specified counseling: Secondary | ICD-10-CM

## 2020-10-28 DIAGNOSIS — C562 Malignant neoplasm of left ovary: Secondary | ICD-10-CM

## 2020-10-28 DIAGNOSIS — Z5111 Encounter for antineoplastic chemotherapy: Secondary | ICD-10-CM | POA: Diagnosis not present

## 2020-10-28 MED ORDER — IOHEXOL 300 MG/ML  SOLN
100.0000 mL | Freq: Once | INTRAMUSCULAR | Status: AC | PRN
  Administered 2020-10-28: 100 mL via INTRAVENOUS

## 2020-10-28 NOTE — Assessment & Plan Note (Signed)
We have extensive discussions about goals of care She is aware that treatment goal is palliative in nature

## 2020-10-28 NOTE — Assessment & Plan Note (Signed)
She has severe pancytopenia despite recent dose adjustment I reviewed her recent blood work with family Her elevated alkaline phosphatase is likely originating from her bone, along with pancytopenia, likely indicative of bone marrow involvement We discussed the risk and benefit of performing bone marrow biopsy It would not change management For now, we will observe only I plan to modify her chemotherapy schedule to every other week She does not need G-CSF support for now

## 2020-10-28 NOTE — Assessment & Plan Note (Signed)
I reviewed her recent blood work with rising tumor marker and CT imaging Overall, she has stable disease control She did not tolerate chemotherapy well despite dose adjustment The plan would be to switch her treatment to every other week with current dose adjustment The patient would like to take this Friday of to visit a family member She will return on December 3 for day 8 cycle 2 of treatment The patient and family members are educated to watch out for signs and symptoms of bowel obstruction such as nausea and constipation

## 2020-10-28 NOTE — Progress Notes (Signed)
[place] OFFICE PROGRESS NOTE  Patient Care Team: Helane Rima, MD as PCP - General (Family Medicine) Lonia Mad, MD as Referring Physician (Neurology) Cameron Sprang, MD as Consulting Physician (Neurology)  ASSESSMENT & PLAN:  Ovarian cancer [facility]) I reviewed her recent blood work with rising tumor marker and CT imaging Overall, she has stable disease control She did not tolerate chemotherapy well despite dose adjustment The plan would be to switch her treatment to every other week with current dose adjustment The patient would like to take this Friday of to visit a family member She will return on December 3 for day 8 cycle 2 of treatment The patient and family members are educated to watch out for signs and symptoms of bowel obstruction such as nausea and constipation  Pancytopenia, acquired ([place]) She has severe pancytopenia despite recent dose adjustment I reviewed her recent blood work with family Her elevated alkaline phosphatase is likely originating from her bone, along with pancytopenia, likely indicative of bone marrow involvement We discussed the risk and benefit of performing bone marrow biopsy It would not change management For now, we will observe only I plan to modify her chemotherapy schedule to every other week She does not need G-CSF support for now  Goals of care, counseling/discussion We have extensive discussions about goals of care She is aware that treatment goal is palliative in nature   No orders of the defined types were placed in this encounter.   All questions were answered. The patient knows to call the clinic with any problems, questions or concerns. The total time spent in the appointment was 40 minutes encounter with patients including review of chart and various tests results, discussions about plan of care and coordination of care plan   Heath Lark, MD 10/28/2020 4:28 PM  INTERVAL HISTORY: Please see below for problem  oriented charting. She returns with 3 family members today to review test results She denies recent fever or chills Denies abdominal pain, bloating or changes in bowel habits She is taking laxatives on a regular basis She denies worsening abdominal pain She expressed desire to visit family members and would like to cancel her treatment that was scheduled for Friday this week  SUMMARY OF ONCOLOGIC HISTORY: Oncology History Overview Note  Negative genetic testing Progressed on Niraparib, gemzar and carboplatin   Carcinomatosis ([place])  12/06/2015 Initial Diagnosis   Carcinomatosis ([place])   07/18/2020 - 08/30/2020 Chemotherapy   The patient had carboplatin for chemotherapy treatment.     09/20/2020 -  Chemotherapy   The patient had topotecan (HYCAMTIN) 6.8 mg in sodium chloride 0.9 % 100 mL chemo infusion, 3.2 mg/m2 = 6.8 mg (80 % of original dose 4 mg/m2), Intravenous,  Once, 2 of 4 cycles Dose modification: 3.2 mg/m2 (80 % of original dose 4 mg/m2, Cycle 1, Reason: Provider Judgment), 3 mg/m2 (75 % of original dose 4 mg/m2, Cycle 1, Reason: Dose Not Tolerated) Administration: 6.8 mg (09/20/2020), 6.4 mg (09/27/2020), 6.4 mg (10/11/2020)  for chemotherapy treatment.    Ovarian cancer, bilateral  12/24/2015 Initial Diagnosis   Ovarian cancer, bilateral ([place])   07/18/2020 - 08/30/2020 Chemotherapy   The patient had carboplatin for chemotherapy treatment.     09/20/2020 -  Chemotherapy   The patient had topotecan (HYCAMTIN) 6.8 mg in sodium chloride 0.9 % 100 mL chemo infusion, 3.2 mg/m2 = 6.8 mg (80 % of original dose 4 mg/m2), Intravenous,  Once, 2 of 4 cycles Dose modification: 3.2 mg/m2 (80 %  of original dose 4 mg/m2, Cycle 1, Reason: Provider Judgment), 3 mg/m2 (75 % of original dose 4 mg/m2, Cycle 1, Reason: Dose Not Tolerated) Administration: 6.8 mg (09/20/2020), 6.4 mg (09/27/2020), 6.4 mg (10/11/2020)  for chemotherapy treatment.    Ovarian cancer ([place])  11/28/2015 Imaging   Ct  scan of abdomen: Peritoneal carcinomatosis and pelvic/inguinal adenopathy. Gynecologic primary is favored.   12/17/2015 Pathology Results   Lymph node, needle/core biopsy, L inguinal LAN - METASTATIC ADENOCARCINOMA. Microscopic Comment Immunohistochemistry will be performed and reported as an addendum. (JDP:ecj 12/18/2015) ADDENDUM: Immunohistochemistry shows strong positivity with cytokeratin 7, estrogen receptor (ER), progesterone receptor (PR), p53 and WT1. Negative markers are cytokeratin 20 , CDX- 2 and gross cystic disease fluid protein. 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 (JDP:kh 12-19-15   12/17/2015 Procedure   Technically successful ultrasound guided core left inguinal adenopathy biopsy   12/24/2015 Tumor Marker   Patient's tumor was tested for the following markers: CA-125 Results of the tumor marker test revealed 608.2   12/27/2015 - 02/28/2016 Chemotherapy   She received neoadjuvant chemo x 3 cycles   01/01/2016 Tumor Marker   Patient's tumor was tested for the following markers: CA-125 Results of the tumor marker test revealed 741.6   01/29/2016 Procedure   Successful placement of a right internal jugular approach power injectable Port-A-Cath. The catheter is ready for immediate use   01/29/2016 Imaging   US abdomen 1. Hyperechoic 2.5 x 1.0 x 1.6 cm lesion in the region the caudate lobe of the liver. Similar finding noted on prior CT of 11/28/2015. This could represent hemangioma. This could represent a malignancy including metastatic disease. 2. Exam otherwise unremarkable. No gallstones. No biliary distention.   02/03/2016 Tumor Marker   Patient's tumor was tested for the following markers: CA-125 Results of the tumor marker test revealed 137.1   02/20/2016 Imaging   MRI brain No acute infarct.  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  No  intracranial mass.  Moderate small vessel disease changes.  Global atrophy without hydrocephalus.  Expanded partially empty sella without secondary findings of pseudotumor cerebri.  Minimal mucosal thickening ethmoid sinus air cells. Small air-fluid levels maxillary sinuses bilaterally may indicate changes of acute sinusitis.    02/28/2016 Tumor Marker   Patient's tumor was tested for the following markers: CA-125 Results of the tumor marker test revealed 33.6   03/02/2016 Imaging   CT chest, abdomen and pelvis 1. Today's study demonstrates a positive response to therapy with resolution of the previously noted malignant ascites, significant regression of previously noted peritoneal implants, and regression of previously noted lymphadenopathy in the abdomen and pelvis. 2. No definite evidence to suggest metastatic disease to the thorax. 3. Stable 1.0 x 1.7 cm intermediate attenuation lesion associated with the posterior aspect of segment 1 of the liver, favored to represent a mildly proteinaceous hepatic cyst. The possibility of a peritoneal implant in this region is not entirely excluded, but is not favored on today's examination. 4. Extensive post infectious scarring inguinal upper right lung, likely sequela of prior necrotizing pneumonia. 5. Multiple tiny pulmonary nodules scattered throughout the lungs bilaterally all measuring 4 mm or less. These are nonspecific, but favored to be benign. Given the patient's history of primary gynecologic malignancy, attention on followup studies is recommended to ensure the stability of these findings. 6. Additional incidental findings, as above   03/14/2016 Imaging   CT angiogram 1. No  pulmonary emboli. 2. Chronic changes to the right upper lobe. 3. Stable lesion in the caudate lobe of the liver. 4. Stable soft tissue prominence to the right of the trachea as described above. 5. Stable small nodule in the right lung.    03/24/2016 Pathology  Results   1. Omentum, resection for tumor - FOCAL ADENOCARCINOMA ASSOCIATED WITH EXTENSIVE FIBROSIS, INFLAMMATION AND HEMOSIDERIN DEPOSITION. 2. Uterus +/- tubes/ovaries, neoplastic, cervix - CERVIX: SLIGHT CERVICITIS AND SQUAMOUS METAPLASIA. - ENDOMETRIUM: ATROPHIC, NO HYPERPLASIA OR MALIGNANCY. - MYOMETRIUM: LEIOMYOMATA WITH DEGENERATIVE CHANGES. NO MALIGNANCY. - UTERINE SEROSA: FOCAL ADENOCARCINOMA ASSOCIATED WITH ADHESIONS. - RIGHT OVARY: BENIGN CALCIFIED NODULE AND BENIGN SEROUS CYST. NO MALIGNANCY IDENTIFIED. - RIGHT FALLOPIAN TUBE: UNREMARKABLE. NO MALIGNANCY IDENTIFIED. - LEFT OVARY: FOCAL ADENOCARCINOMA ASSOCIATED WITH EXTENSIVE FIBROSIS. - LEFT FALLOPIAN TUBE: HYDROSALPINX. NO MALIGNANCY IDENTIFIED. 3. Lymph node, biopsy, right external iliac - METASTATIC ADENOCARCINOMA, 4. Soft tissue, biopsy, left para colic gutter - FOCAL ADENOCARCINOMA ASSOCIATED WITH FIBROSIS. Microscopic Comment 2. OVARY Specimen(s): Uterus with bilateral ovaries, omentum, right external iliac lymph node and left paracolic gutter. Procedure: (including lymph node sampling) Hysterectomy with bilateral salpingo-oophorectomy, omentectomy, lymph node biopsy and paracolic gutter biopsy. Primary tumor site (including laterality): Left ovary. Ovarian surface involvement: Yes Ovarian capsule intact without fragmentation: N/A Maximum tumor size (cm): 2 cm, 2 cm Histologic type: Serous carcinoma. Grade: 3 Peritoneal implants: (specify invasive or non-invasive): Left paracolic gutter positive for carcinoma Pelvic extension (list additional structures on separate lines and if involved): Omentum, right paracolic gutter and uterine serosa. Lymph nodes: number examined 1 ; number positive 1 TNM code: ypT3a, ypN1b, ypMX FIGO Stage (based on pathologic findings, needs clinical correlation): IIIA2 Comments: The left ovary has a 2 cm nodule, which is composed primarily of fibrous tissue with small foci of residual  adenocarcinoma. There is also microscopic involvement of the uterine serosa, the left paracolic gutter biopsy and the omentum. The left paracolic gutter and omental involvement is microscopic and both are associated with extensive fibrosis with focal hemosiderin deposition. The right external iliac node is completely replaced by metastatic adenocarcinoma with serous features. (JDP:ecj 03/30/2016)   03/24/2016 Surgery   Operation: Robotic-assisted laparoscopic total hysterectomy with bilateral salpingoophorectomy, omentectomy, radical tumor debulking  Surgeon: Donaciano Eva  Operative Findings:  : small nodules in omentum, no ascites. Omental nodule (2cm) adherent to lower anterior abdominal wall. 2cm left colonic gutter cystic nodule. 6cm right external iliac lymph node. Grossly normal ovaries and tubes. Fibroid uterus. No gross residual disease at completion of surgery representing R0 optimal/complete resection.    04/17/2016 Imaging   CT abdomen and pelvis 1. There is no evidence of acute inflammatory process within abdomen or pelvis. 2. Stable low-density lesion within caudate lobe of the liver. No new hepatic lesions are noted. 3. Again noted lobulated renal contour and multifocal renal cortical scarring. No hydronephrosis or hydroureter. 4. No significant mesenteric adenopathy. No retroperitoneal adenopathy. Stable minimal residual peritoneal thickening within pelvis. No new pelvic implants or pelvic ascites. 5. Status post hysterectomy.  Unremarkable urinary bladder. 6. Moderate stool within colon as described above. No evidence of colitis.    04/24/2016 - 06/19/2016 Chemotherapy   She received 3 more cycles of chemo after surgery   05/14/2016 Tumor Marker   Patient's tumor was tested for the following markers: CA-125 Results of the tumor marker test revealed 22.5   06/04/2016 Tumor Marker   Patient's tumor was tested for the following markers: CA-125 Results of the tumor  marker test revealed 13.7  07/07/2016 Genetic Testing   Patient has genetic testing done for breast/ovasrian cancer panel Results revealed patient has no actionable mutation   07/23/2016 Imaging   Ct abdomen and pelvis 1. Heterogeneously enhancing 4.9 x 3.2 x 4.4 cm mass along the right pelvic sidewall may represent locally recurrent disease or malignant lymphadenopathy. 2. No other signs of definite metastatic disease noted elsewhere in the abdomen or pelvis. 3. Stable low-attenuation hepatic lesion in the caudate lobe of the liver, which appears to demonstrates some progressive centripetal filling on delayed images. This lesion is presumably benign given its stability compared to prior studies, and is favored to represent a small cavernous hemangioma. 4. Cardiomegaly with biatrial dilatation, which is very severe on the right side. 5. Aortic atherosclerosis. 6. Additional incidental findings, as above.   07/23/2016 Tumor Marker   Patient's tumor was tested for the following markers: CA-125 Results of the tumor marker test revealed 12.3   09/30/2016 Imaging   Ct abdomen and pelvis: 1. 4.9 cm heterogeneously enhancing mass seen along the right pelvic sidewall previously has almost completely resolved. There is some ill-defined soft tissue attenuation/peritoneal thickening in this region today but no discrete measurable lesion is evident. 2. No new or progressive findings on today's exam. 3. Stable hypo attenuating lesion in the caudate lobe of the liver. 4. Subtle aortic atherosclerosis   09/30/2016 Tumor Marker   Patient's tumor was tested for the following markers: CA-125 Results of the tumor marker test revealed 9.6   10/05/2016 Pathology Results   Vagina, biopsy, left cuff - BENIGN FIBROEPITHELIAL (STROMAL) POLYP. - NO DYSPLASIA, ATYPIA OR MALIGNANCY IDENTIFIED.   01/14/2017 Tumor Marker   Patient's tumor was tested for the following markers: CA-125 Results of the tumor marker  test revealed 11.9   05/05/2017 Tumor Marker   Patient's tumor was tested for the following markers: CA-125 Results of the tumor marker test revealed 28   06/14/2017 Tumor Marker   Patient's tumor was tested for the following markers: CA-125 Results of the tumor marker test revealed 45.7   06/24/2017 Imaging   Ct abdomen and pelvis: Increased peritoneal metastatic disease in abdomen and pelvis since prior exam. No evidence of ascites. Stable small benign hepatic hemangioma.   07/21/2017 Tumor Marker   Patient's tumor was tested for the following markers: CA-125 Results of the tumor marker test revealed 84.1   07/21/2017 - 11/03/2017 Chemotherapy   She received carboplatin and taxol   08/11/2017 Adverse Reaction   She had severe neuropathy due to treatment. Does of chemo is reduced starting cycle 2 onwards   09/02/2017 Tumor Marker   Patient's tumor was tested for the following markers: CA-125 Results of the tumor marker test revealed 27.7   09/16/2017 Imaging   CT CHEST IMPRESSION  1. No acute process or evidence of metastatic disease in the chest. 2. Right upper lung bronchiectasis, consolidation, and architectural distortion are most likely post infectious or inflammatory and not significantly changed. 3. Aortic Atherosclerosis (ICD10-I70.0).  CT ABDOMEN AND PELVIS IMPRESSION  1. Response to therapy of nodal and peritoneal metastasis. 2. No new or progressive disease. 3. Caudate lobe hemangioma, as before. 4. Bilateral renal cortical thinning. Similar minimal right-sided caliectasis and hydroureter. Likely secondary to low-grade obstruction by the dominant right pelvic implant.   09/22/2017 Tumor Marker   Patient's tumor was tested for the following markers: CA-125 Results of the tumor marker test revealed 18   11/03/2017 Tumor Marker   Patient's tumor was tested for the following markers: CA-125  Results of the tumor marker test revealed 13.4   11/25/2017 Imaging   1.  Stable to slight interval decrease in size of nodal and peritoneal metastatic disease within the abdomen. 2. No new or progressive disease.   12/03/2017 - 09/11/2019 Chemotherapy   The patient is prescribed [place]   01/17/2018 Tumor Marker   Patient's tumor was tested for the following markers: CA-125 Results of the tumor marker test revealed 11.9   02/22/2018 Tumor Marker   Patient's tumor was tested for the following markers: CA-125 Results of the tumor marker test revealed 11.7   02/22/2018 Imaging   1. Improved nodular thickening along the vaginal cuff on the left and medial to the right iliac vessels, likely treated implants. No evidence of progressive peritoneal disease. 2. Stable hepatic hemangioma. No evidence of solid visceral organ metastases. 3. Bilateral renal cortical scarring.   05/19/2018 Tumor Marker   Patient's tumor was tested for the following markers: CA-125 Results of the tumor marker test revealed 9.9   08/10/2018 Imaging   Stable mild nodular thickening of the left vaginal cuff. No new or progressive disease within the abdomen or pelvis.  Stable small hepatic hemangioma.   09/21/2018 Tumor Marker   Patient's tumor was tested for the following markers: CA-125 Results of the tumor marker test revealed 10.3   11/11/2018 Tumor Marker   Patient's tumor was tested for the following markers: CA-125 Results of the tumor marker test revealed 10.5   12/15/2018 Tumor Marker   Patient's tumor was tested for the following markers: CA-125 Results of the tumor marker test revealed 12.6   01/06/2019 Tumor Marker   Patient's tumor was tested for the following markers: CA-125 Results of the tumor marker test revealed 14.6   01/12/2019 Imaging   1. Chronic extensive right upper lobe scarring changes and bronchiectasis. 2. No mediastinal or hilar mass or adenopathy and no findings for pulmonary metastatic disease. 3. Stable caudate lobe hemangioma. 4. No worrisome omental or  peritoneal surface lesions. 5. Stable pericecal and small pelvic lymph nodes. No new or progressive findings.    09/11/2019 Imaging   Ct abdomen and pelvis Pericecal peritoneal metastatic implants   09/19/2019 - 07/04/2020 Chemotherapy   The patient had carboplatin and gemxar for chemotherapy treatment, then on gemzar maintenance since 11/28/2019    11/20/2019 Imaging   1. Mild decrease in size of peritoneal tumor implant along the lateral aspect of the cecum. 2. Stable tiny sub-cm mesenteric soft tissue nodules or lymph nodes in right lower quadrant and anterior pelvic mesentery. 3. No new or progressive disease identified within the abdomen or pelvis.   03/12/2020 Imaging   1. No acute findings within the abdomen or pelvis. 2. Stable appearance of right lower quadrant peritoneal implant adjacent to cecum. 3. Increase in size of right lower quadrant ileocolic lymph node. 1.2 cm on today's study versus 0.8 previously. 4. Possible early avascular necrosis involving the left femoral head.   Aortic Atherosclerosis (ICD10-I70.0).   07/04/2020 Tumor Marker   Patient's tumor was tested for the following markers: CA-125 Results of the tumor marker test revealed 73   07/09/2020 Imaging   1. Enlarging noncalcified implants in the ileocolonic mesentery, suspicious for metastatic disease in this patient with a rising serum CA 125 level. There is a questionable new peritoneal implant associated with the distal small bowel. No resulting bowel obstruction. 2. No evidence of visceral metastatic disease. 3. Aortic Atherosclerosis (ICD10-I70.0).   07/18/2020 - 08/30/2020 Chemotherapy   The patient had  carboplatin for chemotherapy treatment.     07/18/2020 Tumor Marker   Patient's tumor was tested for the following markers: CA-125 Results of the tumor marker test revealed 123   08/08/2020 Tumor Marker   Patient's tumor was tested for the following markers: CA-125 Results of the tumor marker test  revealed 231   08/30/2020 Tumor Marker   Patient's tumor was tested for the following markers: CA-125 Results of the tumor marker test revealed 635   09/12/2020 Imaging   Mild increase in size of peritoneal metastasis in the right pelvis. Other small peritoneal nodules in right lower quadrant mesentery remain stable.    Stable soft tissue prominence involving the left vaginal cuff, suspicious for tumor.   No evidence of metastatic disease within the chest.     09/20/2020 -  Chemotherapy   The patient had topotecan (HYCAMTIN) 6.8 mg in sodium chloride 0.9 % 100 mL chemo infusion, 3.2 mg/m2 = 6.8 mg (80 % of original dose 4 mg/m2), Intravenous,  Once, 2 of 4 cycles Dose modification: 3.2 mg/m2 (80 % of original dose 4 mg/m2, Cycle 1, Reason: Provider Judgment), 3 mg/m2 (75 % of original dose 4 mg/m2, Cycle 1, Reason: Dose Not Tolerated) Administration: 6.8 mg (09/20/2020), 6.4 mg (09/27/2020), 6.4 mg (10/11/2020)  for chemotherapy treatment.    09/20/2020 Tumor Marker   Patient's tumor was tested for the following markers: CA-125 Results of the tumor marker test revealed 1288   10/11/2020 Tumor Marker   Patient's tumor was tested for the following markers: CA-125 Results of the tumor marker test revealed 1874   10/28/2020 Imaging   1. Redemonstrated soft tissue nodularity involving the terminal ileum and adjacent small bowel mesentery. 2. Unchanged soft tissue nodularity about the vaginal cuff status post hysterectomy. 3. No significant interval change in enlarged celiac axis and portacaval lymph nodes. 4. No evidence of new metastatic disease in the abdomen or pelvis. 5. New small right pleural effusion without definite evidence of metastatic involvement in the included portion of the lung bases, however this new effusion is highly suspicious for pulmonary or and/or pleural metastatic disease. Consider dedicated imaging of the chest.     REVIEW OF SYSTEMS:   Constitutional: Denies  fevers, chills or abnormal weight loss Eyes: Denies blurriness of vision Ears, nose, mouth, throat, and face: Denies mucositis or sore throat Respiratory: Denies cough, dyspnea or wheezes Cardiovascular: Denies palpitation, chest discomfort or lower extremity swelling Gastrointestinal:  Denies nausea, heartburn or change in bowel habits Skin: Denies abnormal skin rashes Lymphatics: Denies new lymphadenopathy or easy bruising Neurological:Denies numbness, tingling or new weaknesses Behavioral/Psych: Mood is stable, no new changes  All other systems were reviewed with the patient and are negative.  I have reviewed the past medical history, past surgical history, social history and family history with the patient and they are unchanged from previous note.  ALLERGIES:  is allergic to codeine, lactulose, and latex.  MEDICATIONS:  Current Outpatient Medications  Medication Sig Dispense Refill  . apixaban (ELIQUIS) 5 MG TABS tablet Take 1 tablet (5 mg total) by mouth 2 (two) times daily. 60 tablet 5  . atorvastatin (LIPITOR) 40 MG tablet Take 40 mg by mouth at bedtime.    . gabapentin (NEURONTIN) 600 MG tablet TAKE 1 TABLET(600 MG) BY MOUTH TWICE DAILY 60 tablet 11  . lidocaine-prilocaine (EMLA) cream Apply to Porta-Cath 1-2 hours prior to access as directed. 30 g 1  . metoprolol succinate (TOPROL-XL) 25 MG 24 hr tablet Take 25 mg by  mouth daily.   5  . Multiple Vitamin (MULTIVITAMIN WITH MINERALS) TABS tablet Take 1 tablet by mouth daily.    Marland Kitchen omega-3 acid ethyl esters (LOVAZA) 1 g capsule Take 1 g by mouth daily.    . ondansetron (ZOFRAN ODT) 4 MG disintegrating tablet Take 1 tablet (4 mg total) by mouth every 8 (eight) hours as needed for nausea or vomiting. 60 tablet 9  . Oxycodone HCl 10 MG TABS Take 1 tablet (10 mg total) by mouth every 6 (six) hours as needed. 60 tablet 0  . PARoxetine (PAXIL) 40 MG tablet Take 40 mg by mouth daily.  0  . polyethylene glycol (MIRALAX / GLYCOLAX) packet  Take 17 g by mouth daily.     No current facility-administered medications for this visit.    PHYSICAL EXAMINATION: ECOG PERFORMANCE STATUS: 2 - Symptomatic, <50% confined to bed  Vitals:   10/28/20 1422  BP: 124/75  Pulse: 99  Resp: 18  SpO2: 100%   Filed Weights   10/28/20 1422  Weight: 204 lb (92.5 kg)    GENERAL:alert, no distress and comfortable NEURO: alert & oriented x 3 with fluent speech, no focal motor/sensory deficits  LABORATORY DATA:  I have reviewed the data as listed    Component Value Date/Time   NA 140 10/18/2020 1006   NA 142 12/08/2017 1051   K 3.7 10/18/2020 1006   K 4.4 12/08/2017 1051   CL 105 10/18/2020 1006   CL 105 06/14/2017 1430   CO2 27 10/18/2020 1006   CO2 28 12/08/2017 1051   GLUCOSE 96 10/18/2020 1006   GLUCOSE 97 12/08/2017 1051   BUN 9 10/18/2020 1006   BUN 16.2 12/08/2017 1051   CREATININE 0.73 10/18/2020 1006   CREATININE 0.72 07/18/2020 1141   CREATININE 0.9 12/08/2017 1051   CALCIUM 9.2 10/18/2020 1006   CALCIUM 9.5 12/08/2017 1051   PROT 7.0 10/18/2020 1006   PROT 6.9 12/08/2017 1051   ALBUMIN 3.0 (L) 10/18/2020 1006   ALBUMIN 3.6 12/08/2017 1051   AST 41 10/18/2020 1006   AST 26 07/18/2020 1141   AST 18 12/08/2017 1051   ALT 32 10/18/2020 1006   ALT 25 07/18/2020 1141   ALT 20 12/08/2017 1051   ALKPHOS 400 (H) 10/18/2020 1006   ALKPHOS 131 12/08/2017 1051   BILITOT 0.6 10/18/2020 1006   BILITOT 0.5 07/18/2020 1141   BILITOT 0.70 12/08/2017 1051   GFRNONAA >60 10/18/2020 1006   GFRNONAA >60 07/18/2020 1141   GFRAA >60 08/30/2020 1105   GFRAA >60 07/18/2020 1141    No results found for: SPEP, UPEP  Lab Results  Component Value Date   WBC 2.9 (L) 10/18/2020   NEUTROABS 0.9 (L) 10/18/2020   HGB 9.7 (L) 10/18/2020   HCT 28.5 (L) 10/18/2020   MCV 106.3 (H) 10/18/2020   PLT 252 10/18/2020      Chemistry      Component Value Date/Time   NA 140 10/18/2020 1006   NA 142 12/08/2017 1051   K 3.7 10/18/2020  1006   K 4.4 12/08/2017 1051   CL 105 10/18/2020 1006   CL 105 06/14/2017 1430   CO2 27 10/18/2020 1006   CO2 28 12/08/2017 1051   BUN 9 10/18/2020 1006   BUN 16.2 12/08/2017 1051   CREATININE 0.73 10/18/2020 1006   CREATININE 0.72 07/18/2020 1141   CREATININE 0.9 12/08/2017 1051      Component Value Date/Time   CALCIUM 9.2 10/18/2020 1006   CALCIUM 9.5  12/08/2017 1051   ALKPHOS 400 (H) 10/18/2020 1006   ALKPHOS 131 12/08/2017 1051   AST 41 10/18/2020 1006   AST 26 07/18/2020 1141   AST 18 12/08/2017 1051   ALT 32 10/18/2020 1006   ALT 25 07/18/2020 1141   ALT 20 12/08/2017 1051   BILITOT 0.6 10/18/2020 1006   BILITOT 0.5 07/18/2020 1141   BILITOT 0.70 12/08/2017 1051       RADIOGRAPHIC STUDIES: I reviewed multiple imaging studies with the patient and family I have personally reviewed the radiological images as listed and agreed with the findings in the report. CT ABDOMEN PELVIS W CONTRAST  Result Date: 10/28/2020 CLINICAL DATA:  Ovarian cancer, assess treatment response EXAM: CT ABDOMEN AND PELVIS WITH CONTRAST TECHNIQUE: Multidetector CT imaging of the abdomen and pelvis was performed using the standard protocol following bolus administration of intravenous contrast. CONTRAST:  143m OMNIPAQUE IOHEXOL 300 MG/ML SOLN, additional oral enteric contrast COMPARISON:  09/12/2020, 07/09/2020 FINDINGS: Lower chest: New small right pleural effusion. Hepatobiliary: No solid liver abnormality is seen. No gallstones, gallbladder wall thickening, or biliary dilatation. Pancreas: Unremarkable. No pancreatic ductal dilatation or surrounding inflammatory changes. Spleen: Normal in size without significant abnormality. Adrenals/Urinary Tract: Adrenal glands are unremarkable. Kidneys are normal, without renal calculi, solid lesion, or hydronephrosis. Bladder is unremarkable. Stomach/Bowel: Stomach is within normal limits. Appendix appears normal. Redemonstrated soft tissue nodularity involving the  terminal ileum and adjacent small bowel mesentery, dominant soft tissue nodule measuring 4.2 x 2.7 cm, not significantly changed (series 2, image 58). Vascular/Lymphatic: No significant vascular findings are present. No significant interval change in enlarged celiac axis and portacaval lymph nodes, largest retrocaval node measuring 1.2 x 0.8 cm (series 2, image 24). Reproductive: Status post hysterectomy. Unchanged soft tissue nodularity about the vaginal cuff, largest, central nodule measuring 3.0 x 1.9 cm (series 2, image 72). Other: No abdominal wall hernia or abnormality. No abdominopelvic ascites. Musculoskeletal: No acute or significant osseous findings. IMPRESSION: 1. Redemonstrated soft tissue nodularity involving the terminal ileum and adjacent small bowel mesentery. 2. Unchanged soft tissue nodularity about the vaginal cuff status post hysterectomy. 3. No significant interval change in enlarged celiac axis and portacaval lymph nodes. 4. No evidence of new metastatic disease in the abdomen or pelvis. 5. New small right pleural effusion without definite evidence of metastatic involvement in the included portion of the lung bases, however this new effusion is highly suspicious for pulmonary or and/or pleural metastatic disease. Consider dedicated imaging of the chest. Electronically Signed   By: AEddie CandleM.D.   On: 10/28/2020 13:17

## 2020-11-01 ENCOUNTER — Inpatient Hospital Stay: Payer: Medicare (Managed Care)

## 2020-11-08 ENCOUNTER — Inpatient Hospital Stay: Payer: Medicare (Managed Care) | Attending: Hematology and Oncology

## 2020-11-08 ENCOUNTER — Inpatient Hospital Stay: Payer: Medicare (Managed Care)

## 2020-11-08 ENCOUNTER — Encounter: Payer: Self-pay | Admitting: Hematology and Oncology

## 2020-11-08 ENCOUNTER — Inpatient Hospital Stay (HOSPITAL_BASED_OUTPATIENT_CLINIC_OR_DEPARTMENT_OTHER): Payer: Medicare (Managed Care) | Admitting: Hematology and Oncology

## 2020-11-08 ENCOUNTER — Other Ambulatory Visit: Payer: Self-pay

## 2020-11-08 DIAGNOSIS — D61818 Other pancytopenia: Secondary | ICD-10-CM

## 2020-11-08 DIAGNOSIS — Z8673 Personal history of transient ischemic attack (TIA), and cerebral infarction without residual deficits: Secondary | ICD-10-CM | POA: Insufficient documentation

## 2020-11-08 DIAGNOSIS — C786 Secondary malignant neoplasm of retroperitoneum and peritoneum: Secondary | ICD-10-CM | POA: Insufficient documentation

## 2020-11-08 DIAGNOSIS — C562 Malignant neoplasm of left ovary: Secondary | ICD-10-CM

## 2020-11-08 DIAGNOSIS — C800 Disseminated malignant neoplasm, unspecified: Secondary | ICD-10-CM

## 2020-11-08 DIAGNOSIS — Z9221 Personal history of antineoplastic chemotherapy: Secondary | ICD-10-CM | POA: Diagnosis not present

## 2020-11-08 DIAGNOSIS — R634 Abnormal weight loss: Secondary | ICD-10-CM

## 2020-11-08 DIAGNOSIS — Z90722 Acquired absence of ovaries, bilateral: Secondary | ICD-10-CM | POA: Insufficient documentation

## 2020-11-08 DIAGNOSIS — C563 Malignant neoplasm of bilateral ovaries: Secondary | ICD-10-CM

## 2020-11-08 DIAGNOSIS — Z5111 Encounter for antineoplastic chemotherapy: Secondary | ICD-10-CM | POA: Diagnosis present

## 2020-11-08 DIAGNOSIS — Z79899 Other long term (current) drug therapy: Secondary | ICD-10-CM | POA: Insufficient documentation

## 2020-11-08 DIAGNOSIS — Z95828 Presence of other vascular implants and grafts: Secondary | ICD-10-CM

## 2020-11-08 DIAGNOSIS — Z7189 Other specified counseling: Secondary | ICD-10-CM

## 2020-11-08 DIAGNOSIS — R918 Other nonspecific abnormal finding of lung field: Secondary | ICD-10-CM | POA: Diagnosis not present

## 2020-11-08 DIAGNOSIS — Z90710 Acquired absence of both cervix and uterus: Secondary | ICD-10-CM | POA: Insufficient documentation

## 2020-11-08 DIAGNOSIS — Z7901 Long term (current) use of anticoagulants: Secondary | ICD-10-CM | POA: Diagnosis not present

## 2020-11-08 DIAGNOSIS — C561 Malignant neoplasm of right ovary: Secondary | ICD-10-CM | POA: Diagnosis not present

## 2020-11-08 LAB — CBC WITH DIFFERENTIAL/PLATELET
Abs Immature Granulocytes: 0.01 10*3/uL (ref 0.00–0.07)
Basophils Absolute: 0.1 10*3/uL (ref 0.0–0.1)
Basophils Relative: 1 %
Eosinophils Absolute: 0.8 10*3/uL — ABNORMAL HIGH (ref 0.0–0.5)
Eosinophils Relative: 12 %
HCT: 34.7 % — ABNORMAL LOW (ref 36.0–46.0)
Hemoglobin: 11.6 g/dL — ABNORMAL LOW (ref 12.0–15.0)
Immature Granulocytes: 0 %
Lymphocytes Relative: 26 %
Lymphs Abs: 1.6 10*3/uL (ref 0.7–4.0)
MCH: 35 pg — ABNORMAL HIGH (ref 26.0–34.0)
MCHC: 33.4 g/dL (ref 30.0–36.0)
MCV: 104.8 fL — ABNORMAL HIGH (ref 80.0–100.0)
Monocytes Absolute: 0.9 10*3/uL (ref 0.1–1.0)
Monocytes Relative: 14 %
Neutro Abs: 3.1 10*3/uL (ref 1.7–7.7)
Neutrophils Relative %: 47 %
Platelets: 317 10*3/uL (ref 150–400)
RBC: 3.31 MIL/uL — ABNORMAL LOW (ref 3.87–5.11)
RDW: 16.3 % — ABNORMAL HIGH (ref 11.5–15.5)
WBC: 6.4 10*3/uL (ref 4.0–10.5)
nRBC: 0 % (ref 0.0–0.2)

## 2020-11-08 LAB — COMPREHENSIVE METABOLIC PANEL
ALT: 32 U/L (ref 0–44)
AST: 71 U/L — ABNORMAL HIGH (ref 15–41)
Albumin: 3 g/dL — ABNORMAL LOW (ref 3.5–5.0)
Alkaline Phosphatase: 701 U/L — ABNORMAL HIGH (ref 38–126)
Anion gap: 11 (ref 5–15)
BUN: 12 mg/dL (ref 8–23)
CO2: 24 mmol/L (ref 22–32)
Calcium: 9.7 mg/dL (ref 8.9–10.3)
Chloride: 105 mmol/L (ref 98–111)
Creatinine, Ser: 0.78 mg/dL (ref 0.44–1.00)
GFR, Estimated: >60 mL/min (ref >60)
Glucose, Bld: 106 mg/dL — ABNORMAL HIGH (ref 70–99)
Potassium: 4 mmol/L (ref 3.5–5.1)
Sodium: 140 mmol/L (ref 135–145)
Total Bilirubin: 0.9 mg/dL (ref 0.3–1.2)
Total Protein: 8 g/dL (ref 6.5–8.1)

## 2020-11-08 MED ORDER — TOPOTECAN HCL CHEMO INJECTION 4 MG
3.0000 mg/m2 | Freq: Once | INTRAVENOUS | Status: AC
  Administered 2020-11-08: 6.3 mg via INTRAVENOUS
  Filled 2020-11-08: qty 6.3

## 2020-11-08 MED ORDER — SODIUM CHLORIDE 0.9% FLUSH
10.0000 mL | Freq: Once | INTRAVENOUS | Status: AC
  Administered 2020-11-08: 10 mL
  Filled 2020-11-08: qty 10

## 2020-11-08 MED ORDER — LIDOCAINE-PRILOCAINE 2.5-2.5 % EX CREA: TOPICAL_CREAM | CUTANEOUS | 11 refills | Status: AC

## 2020-11-08 MED ORDER — PROCHLORPERAZINE MALEATE 10 MG PO TABS
10.0000 mg | ORAL_TABLET | Freq: Once | ORAL | Status: AC
  Administered 2020-11-08: 10 mg via ORAL

## 2020-11-08 MED ORDER — SODIUM CHLORIDE 0.9 % IV SOLN
Freq: Once | INTRAVENOUS | Status: AC
  Filled 2020-11-08: qty 250

## 2020-11-08 MED ORDER — PROCHLORPERAZINE MALEATE 10 MG PO TABS
ORAL_TABLET | ORAL | Status: AC
  Filled 2020-11-08: qty 1

## 2020-11-08 MED ORDER — SODIUM CHLORIDE 0.9% FLUSH
10.0000 mL | INTRAVENOUS | Status: DC | PRN
  Administered 2020-11-08: 10 mL
  Filled 2020-11-08: qty 10

## 2020-11-08 MED ORDER — HEPARIN SOD (PORK) LOCK FLUSH 100 UNIT/ML IV SOLN
500.0000 [IU] | Freq: Once | INTRAVENOUS | Status: AC | PRN
  Administered 2020-11-08: 500 [IU]
  Filled 2020-11-08: qty 5

## 2020-11-08 NOTE — Assessment & Plan Note (Signed)
Her pancytopenia has improved with recent dose adjustment She is not symptomatic and denies recent bleeding We will proceed with treatment as scheduled

## 2020-11-08 NOTE — Progress Notes (Signed)
[facility] OFFICE PROGRESS NOTE  Patient Care Team: Helane Rima, MD as PCP - General (Family Medicine) Lonia Mad, MD as Referring Physician (Neurology) Cameron Sprang, MD as Consulting Physician (Neurology)  ASSESSMENT & PLAN:  Ovarian cancer [facility]) So far, she have no new symptoms With recent treatment break, the pancytopenia has improved Per previous discussion, we will continue treatment every other week Due to recent weight loss, I adjusted the dose of her chemotherapy a little bit  Weight loss She has lost her weight recently She claims she is eating well I will adjust the dose of her chemotherapy accordingly  Pancytopenia, acquired ([place]) Her pancytopenia has improved with recent dose adjustment She is not symptomatic and denies recent bleeding We will proceed with treatment as scheduled   No orders of the defined types were placed in this encounter.   All questions were answered. The patient knows to call the clinic with any problems, questions or concerns. The total time spent in the appointment was 20 minutes encounter with patients including review of chart and various tests results, discussions about plan of care and coordination of care plan   Heath Lark, MD 11/08/2020 9:05 AM  INTERVAL HISTORY: Please see below for problem oriented charting. She returns with her daughter for further follow-up She had treatment break recently and felt good No recent infection, fever or chills No recent bleeding Denies abdominal pain, nausea or changes in bowel habits She denies recent constipation She is noted to have lost some weight although she claims she is eating well over the holidays  SUMMARY OF ONCOLOGIC HISTORY: Oncology History Overview Note  Negative genetic testing Progressed on Niraparib, gemzar and carboplatin   Carcinomatosis ([place])  12/06/2015 Initial Diagnosis   Carcinomatosis ([place])   07/18/2020 - 08/30/2020 Chemotherapy   The  patient had carboplatin for chemotherapy treatment.     09/20/2020 -  Chemotherapy   The patient had topotecan (HYCAMTIN) 6.8 mg in sodium chloride 0.9 % 100 mL chemo infusion, 3.2 mg/m2 = 6.8 mg (80 % of original dose 4 mg/m2), Intravenous,  Once, 2 of 4 cycles Dose modification: 3.2 mg/m2 (80 % of original dose 4 mg/m2, Cycle 1, Reason: Provider Judgment), 3 mg/m2 (75 % of original dose 4 mg/m2, Cycle 1, Reason: Dose Not Tolerated) Administration: 6.8 mg (09/20/2020), 6.4 mg (09/27/2020), 6.4 mg (10/11/2020)  for chemotherapy treatment.    Ovarian cancer, bilateral  12/24/2015 Initial Diagnosis   Ovarian cancer, bilateral ([place])   07/18/2020 - 08/30/2020 Chemotherapy   The patient had carboplatin for chemotherapy treatment.     09/20/2020 -  Chemotherapy   The patient had topotecan (HYCAMTIN) 6.8 mg in sodium chloride 0.9 % 100 mL chemo infusion, 3.2 mg/m2 = 6.8 mg (80 % of original dose 4 mg/m2), Intravenous,  Once, 2 of 4 cycles Dose modification: 3.2 mg/m2 (80 % of original dose 4 mg/m2, Cycle 1, Reason: Provider Judgment), 3 mg/m2 (75 % of original dose 4 mg/m2, Cycle 1, Reason: Dose Not Tolerated) Administration: 6.8 mg (09/20/2020), 6.4 mg (09/27/2020), 6.4 mg (10/11/2020)  for chemotherapy treatment.    Ovarian cancer ([place])  11/28/2015 Imaging   Ct scan of abdomen: Peritoneal carcinomatosis and pelvic/inguinal adenopathy. Gynecologic primary is favored.   12/17/2015 Pathology Results   Lymph node, needle/core biopsy, L inguinal LAN - METASTATIC ADENOCARCINOMA. Microscopic Comment Immunohistochemistry will be performed and reported as an addendum. (JDP:ecj 12/18/2015) ADDENDUM: Immunohistochemistry shows strong positivity with cytokeratin 7, estrogen receptor (ER), progesterone receptor (PR), p53 and  WT1. Negative markers are cytokeratin 20 , CDX- 2 and gross cystic disease fluid protein. The morphology and immunophenotype are most consistent with a high grade gynecologic carcinoma  including high grade ovarian serous carcinoma. (JDP:kh 12-19-15   12/17/2015 Procedure   Technically successful ultrasound guided core left inguinal adenopathy biopsy   12/24/2015 Tumor Marker   Patient's tumor was tested for the following markers: CA-125 Results of the tumor marker test revealed 608.2   12/27/2015 - 02/28/2016 Chemotherapy   She received neoadjuvant chemo x 3 cycles   01/01/2016 Tumor Marker   Patient's tumor was tested for the following markers: CA-125 Results of the tumor marker test revealed 741.6   01/29/2016 Procedure   Successful placement of a right internal jugular approach power injectable Port-A-Cath. The catheter is ready for immediate use   01/29/2016 Imaging   US abdomen 1. Hyperechoic 2.5 x 1.0 x 1.6 cm lesion in the region the caudate lobe of the liver. Similar finding noted on prior CT of 11/28/2015. This could represent hemangioma. This could represent a malignancy including metastatic disease. 2. Exam otherwise unremarkable. No gallstones. No biliary distention.   02/03/2016 Tumor Marker   Patient's tumor was tested for the following markers: CA-125 Results of the tumor marker test revealed 137.1   02/20/2016 Imaging   MRI brain No acute infarct.  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  No intracranial mass.  Moderate small vessel disease changes.  Global atrophy without hydrocephalus.  Expanded partially empty sella without secondary findings of pseudotumor cerebri.  Minimal mucosal thickening ethmoid sinus air cells. Small air-fluid levels maxillary sinuses bilaterally may indicate changes of acute sinusitis.    02/28/2016 Tumor Marker   Patient's tumor was tested for the following markers: CA-125 Results of the tumor marker test revealed 33.6   03/02/2016 Imaging   CT chest, abdomen and pelvis 1. Today's study demonstrates a positive response to therapy with resolution of the previously noted malignant  ascites, significant regression of previously noted peritoneal implants, and regression of previously noted lymphadenopathy in the abdomen and pelvis. 2. No definite evidence to suggest metastatic disease to the thorax. 3. Stable 1.0 x 1.7 cm intermediate attenuation lesion associated with the posterior aspect of segment 1 of the liver, favored to represent a mildly proteinaceous hepatic cyst. The possibility of a peritoneal implant in this region is not entirely excluded, but is not favored on today's examination. 4. Extensive post infectious scarring inguinal upper right lung, likely sequela of prior necrotizing pneumonia. 5. Multiple tiny pulmonary nodules scattered throughout the lungs bilaterally all measuring 4 mm or less. These are nonspecific, but favored to be benign. Given the patient's history of primary gynecologic malignancy, attention on followup studies is recommended to ensure the stability of these findings. 6. Additional incidental findings, as above   03/14/2016 Imaging   CT angiogram 1. No pulmonary emboli. 2. Chronic changes to the right upper lobe. 3. Stable lesion in the caudate lobe of the liver. 4. Stable soft tissue prominence to the right of the trachea as described above. 5. Stable small nodule in the right lung.    03/24/2016 Pathology Results   1. Omentum, resection for tumor - FOCAL ADENOCARCINOMA ASSOCIATED WITH EXTENSIVE FIBROSIS, INFLAMMATION AND HEMOSIDERIN DEPOSITION. 2. Uterus +/- tubes/ovaries, neoplastic, cervix - CERVIX: SLIGHT CERVICITIS AND SQUAMOUS METAPLASIA. - ENDOMETRIUM: ATROPHIC, NO HYPERPLASIA OR MALIGNANCY. - MYOMETRIUM: LEIOMYOMATA WITH DEGENERATIVE CHANGES. NO MALIGNANCY. - UTERINE SEROSA: FOCAL ADENOCARCINOMA ASSOCIATED WITH ADHESIONS. - RIGHT OVARY: BENIGN CALCIFIED NODULE AND  BENIGN SEROUS CYST. NO MALIGNANCY IDENTIFIED. - RIGHT FALLOPIAN TUBE: UNREMARKABLE. NO MALIGNANCY IDENTIFIED. - LEFT OVARY: FOCAL ADENOCARCINOMA ASSOCIATED WITH  EXTENSIVE FIBROSIS. - LEFT FALLOPIAN TUBE: HYDROSALPINX. NO MALIGNANCY IDENTIFIED. 3. Lymph node, biopsy, right external iliac - METASTATIC ADENOCARCINOMA, 4. Soft tissue, biopsy, left para colic gutter - FOCAL ADENOCARCINOMA ASSOCIATED WITH FIBROSIS. Microscopic Comment 2. OVARY Specimen(s): Uterus with bilateral ovaries, omentum, right external iliac lymph node and left paracolic gutter. Procedure: (including lymph node sampling) Hysterectomy with bilateral salpingo-oophorectomy, omentectomy, lymph node biopsy and paracolic gutter biopsy. Primary tumor site (including laterality): Left ovary. Ovarian surface involvement: Yes Ovarian capsule intact without fragmentation: N/A Maximum tumor size (cm): 2 cm, 2 cm Histologic type: Serous carcinoma. Grade: 3 Peritoneal implants: (specify invasive or non-invasive): Left paracolic gutter positive for carcinoma Pelvic extension (list additional structures on separate lines and if involved): Omentum, right paracolic gutter and uterine serosa. Lymph nodes: number examined 1 ; number positive 1 TNM code: ypT3a, ypN1b, ypMX FIGO Stage (based on pathologic findings, needs clinical correlation): IIIA2 Comments: The left ovary has a 2 cm nodule, which is composed primarily of fibrous tissue with small foci of residual adenocarcinoma. There is also microscopic involvement of the uterine serosa, the left paracolic gutter biopsy and the omentum. The left paracolic gutter and omental involvement is microscopic and both are associated with extensive fibrosis with focal hemosiderin deposition. The right external iliac node is completely replaced by metastatic adenocarcinoma with serous features. (JDP:ecj 03/30/2016)   03/24/2016 Surgery   Operation: Robotic-assisted laparoscopic total hysterectomy with bilateral salpingoophorectomy, omentectomy, radical tumor debulking  Surgeon: Donaciano Eva  Operative Findings:  : small nodules in omentum, no  ascites. Omental nodule (2cm) adherent to lower anterior abdominal wall. 2cm left colonic gutter cystic nodule. 6cm right external iliac lymph node. Grossly normal ovaries and tubes. Fibroid uterus. No gross residual disease at completion of surgery representing R0 optimal/complete resection.    04/17/2016 Imaging   CT abdomen and pelvis 1. There is no evidence of acute inflammatory process within abdomen or pelvis. 2. Stable low-density lesion within caudate lobe of the liver. No new hepatic lesions are noted. 3. Again noted lobulated renal contour and multifocal renal cortical scarring. No hydronephrosis or hydroureter. 4. No significant mesenteric adenopathy. No retroperitoneal adenopathy. Stable minimal residual peritoneal thickening within pelvis. No new pelvic implants or pelvic ascites. 5. Status post hysterectomy.  Unremarkable urinary bladder. 6. Moderate stool within colon as described above. No evidence of colitis.    04/24/2016 - 06/19/2016 Chemotherapy   She received 3 more cycles of chemo after surgery   05/14/2016 Tumor Marker   Patient's tumor was tested for the following markers: CA-125 Results of the tumor marker test revealed 22.5   06/04/2016 Tumor Marker   Patient's tumor was tested for the following markers: CA-125 Results of the tumor marker test revealed 13.7   07/07/2016 Genetic Testing   Patient has genetic testing done for breast/ovasrian cancer panel Results revealed patient has no actionable mutation   07/23/2016 Imaging   Ct abdomen and pelvis 1. Heterogeneously enhancing 4.9 x 3.2 x 4.4 cm mass along the right pelvic sidewall may represent locally recurrent disease or malignant lymphadenopathy. 2. No other signs of definite metastatic disease noted elsewhere in the abdomen or pelvis. 3. Stable low-attenuation hepatic lesion in the caudate lobe of the liver, which appears to demonstrates some progressive centripetal filling on delayed images. This lesion is  presumably benign given its stability compared to prior studies, and is favored  to represent a small cavernous hemangioma. 4. Cardiomegaly with biatrial dilatation, which is very severe on the right side. 5. Aortic atherosclerosis. 6. Additional incidental findings, as above.   07/23/2016 Tumor Marker   Patient's tumor was tested for the following markers: CA-125 Results of the tumor marker test revealed 12.3   09/30/2016 Imaging   Ct abdomen and pelvis: 1. 4.9 cm heterogeneously enhancing mass seen along the right pelvic sidewall previously has almost completely resolved. There is some ill-defined soft tissue attenuation/peritoneal thickening in this region today but no discrete measurable lesion is evident. 2. No new or progressive findings on today's exam. 3. Stable hypo attenuating lesion in the caudate lobe of the liver. 4. Subtle aortic atherosclerosis   09/30/2016 Tumor Marker   Patient's tumor was tested for the following markers: CA-125 Results of the tumor marker test revealed 9.6   10/05/2016 Pathology Results   Vagina, biopsy, left cuff - BENIGN FIBROEPITHELIAL (STROMAL) POLYP. - NO DYSPLASIA, ATYPIA OR MALIGNANCY IDENTIFIED.   01/14/2017 Tumor Marker   Patient's tumor was tested for the following markers: CA-125 Results of the tumor marker test revealed 11.9   05/05/2017 Tumor Marker   Patient's tumor was tested for the following markers: CA-125 Results of the tumor marker test revealed 28   06/14/2017 Tumor Marker   Patient's tumor was tested for the following markers: CA-125 Results of the tumor marker test revealed 45.7   06/24/2017 Imaging   Ct abdomen and pelvis: Increased peritoneal metastatic disease in abdomen and pelvis since prior exam. No evidence of ascites. Stable small benign hepatic hemangioma.   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 Does of chemo is reduced starting cycle 2 onwards   09/02/2017 Tumor Marker   Patient's tumor was tested for the following markers: CA-125 Results of the tumor marker test revealed 27.7   09/16/2017 Imaging   CT CHEST IMPRESSION  1. No acute process or evidence of metastatic disease in the chest. 2. Right upper lung bronchiectasis, consolidation, and architectural distortion are most likely post infectious or inflammatory and not significantly changed. 3. Aortic Atherosclerosis (ICD10-I70.0).  CT ABDOMEN AND PELVIS IMPRESSION  1. Response to therapy of nodal and peritoneal metastasis. 2. No new or progressive disease. 3. Caudate lobe hemangioma, as before. 4. Bilateral renal cortical thinning. Similar minimal right-sided caliectasis and hydroureter. Likely secondary to low-grade obstruction by the dominant right pelvic implant.   09/22/2017 Tumor Marker   Patient's tumor was tested for the following markers: CA-125 Results of the tumor marker test revealed 18   11/03/2017 Tumor Marker   Patient's tumor was tested for the following markers: CA-125 Results of the tumor marker test revealed 13.4   11/25/2017 Imaging   1. Stable to slight interval decrease in size of nodal and peritoneal metastatic disease within the abdomen. 2. No new or progressive disease.   12/03/2017 - 09/11/2019 Chemotherapy   The patient is prescribed [place]   01/17/2018 Tumor Marker   Patient's tumor was tested for the following markers: CA-125 Results of the tumor marker test revealed 11.9   02/22/2018 Tumor Marker   Patient's tumor was tested for the following markers: CA-125 Results of the tumor marker test revealed 11.7   02/22/2018 Imaging   1. Improved nodular  thickening along the vaginal cuff on the left and medial to the right iliac vessels, likely treated implants. No  evidence of progressive peritoneal disease. 2. Stable hepatic hemangioma. No evidence of solid visceral organ metastases. 3. Bilateral renal cortical scarring.   05/19/2018 Tumor Marker   Patient's tumor was tested for the following markers: CA-125 Results of the tumor marker test revealed 9.9   08/10/2018 Imaging   Stable mild nodular thickening of the left vaginal cuff. No new or progressive disease within the abdomen or pelvis.  Stable small hepatic hemangioma.   09/21/2018 Tumor Marker   Patient's tumor was tested for the following markers: CA-125 Results of the tumor marker test revealed 10.3   11/11/2018 Tumor Marker   Patient's tumor was tested for the following markers: CA-125 Results of the tumor marker test revealed 10.5   12/15/2018 Tumor Marker   Patient's tumor was tested for the following markers: CA-125 Results of the tumor marker test revealed 12.6   01/06/2019 Tumor Marker   Patient's tumor was tested for the following markers: CA-125 Results of the tumor marker test revealed 14.6   01/12/2019 Imaging   1. Chronic extensive right upper lobe scarring changes and bronchiectasis. 2. No mediastinal or hilar mass or adenopathy and no findings for pulmonary metastatic disease. 3. Stable caudate lobe hemangioma. 4. No worrisome omental or peritoneal surface lesions. 5. Stable pericecal and small pelvic lymph nodes. No new or progressive findings.    09/11/2019 Imaging   Ct abdomen and pelvis Pericecal peritoneal metastatic implants   09/19/2019 - 07/04/2020 Chemotherapy   The patient had carboplatin and gemxar for chemotherapy treatment, then on gemzar maintenance since 11/28/2019    11/20/2019 Imaging   1. Mild decrease in size of peritoneal tumor implant along the lateral aspect of the cecum. 2. Stable tiny sub-cm mesenteric soft tissue nodules or lymph nodes in right lower quadrant and anterior pelvic mesentery. 3. No new or progressive disease identified within the  abdomen or pelvis.   03/12/2020 Imaging   1. No acute findings within the abdomen or pelvis. 2. Stable appearance of right lower quadrant peritoneal implant adjacent to cecum. 3. Increase in size of right lower quadrant ileocolic lymph node. 1.2 cm on today's study versus 0.8 previously. 4. Possible early avascular necrosis involving the left femoral head.   Aortic Atherosclerosis (ICD10-I70.0).   07/04/2020 Tumor Marker   Patient's tumor was tested for the following markers: CA-125 Results of the tumor marker test revealed 73   07/09/2020 Imaging   1. Enlarging noncalcified implants in the ileocolonic mesentery, suspicious for metastatic disease in this patient with a rising serum CA 125 level. There is a questionable new peritoneal implant associated with the distal small bowel. No resulting bowel obstruction. 2. No evidence of visceral metastatic disease. 3. Aortic Atherosclerosis (ICD10-I70.0).   07/18/2020 - 08/30/2020 Chemotherapy   The patient had carboplatin for chemotherapy treatment.     07/18/2020 Tumor Marker   Patient's tumor was tested for the following markers: CA-125 Results of the tumor marker test revealed 123   08/08/2020 Tumor Marker   Patient's tumor was tested for the following markers: CA-125 Results of the tumor marker test revealed 231   08/30/2020 Tumor Marker   Patient's tumor was tested for the following markers: CA-125 Results of the tumor marker test revealed 635   09/12/2020 Imaging   Mild increase in size of peritoneal metastasis in the right pelvis. Other small peritoneal nodules in right lower quadrant mesentery remain stable.  Stable soft tissue prominence involving the left vaginal cuff, suspicious for tumor.   No evidence of metastatic disease within the chest.     09/20/2020 -  Chemotherapy   The patient had topotecan (HYCAMTIN) 6.8 mg in sodium chloride 0.9 % 100 mL chemo infusion, 3.2 mg/m2 = 6.8 mg (80 % of original dose 4 mg/m2),  Intravenous,  Once, 2 of 4 cycles Dose modification: 3.2 mg/m2 (80 % of original dose 4 mg/m2, Cycle 1, Reason: Provider Judgment), 3 mg/m2 (75 % of original dose 4 mg/m2, Cycle 1, Reason: Dose Not Tolerated) Administration: 6.8 mg (09/20/2020), 6.4 mg (09/27/2020), 6.4 mg (10/11/2020)  for chemotherapy treatment.    09/20/2020 Tumor Marker   Patient's tumor was tested for the following markers: CA-125 Results of the tumor marker test revealed 1288   10/11/2020 Tumor Marker   Patient's tumor was tested for the following markers: CA-125 Results of the tumor marker test revealed 1874   10/28/2020 Imaging   1. Redemonstrated soft tissue nodularity involving the terminal ileum and adjacent small bowel mesentery. 2. Unchanged soft tissue nodularity about the vaginal cuff status post hysterectomy. 3. No significant interval change in enlarged celiac axis and portacaval lymph nodes. 4. No evidence of new metastatic disease in the abdomen or pelvis. 5. New small right pleural effusion without definite evidence of metastatic involvement in the included portion of the lung bases, however this new effusion is highly suspicious for pulmonary or and/or pleural metastatic disease. Consider dedicated imaging of the chest.     REVIEW OF SYSTEMS:   Constitutional: Denies fevers, chills  Eyes: Denies blurriness of vision Ears, nose, mouth, throat, and face: Denies mucositis or sore throat Respiratory: Denies cough, dyspnea or wheezes Cardiovascular: Denies palpitation, chest discomfort or lower extremity swelling Gastrointestinal:  Denies nausea, heartburn or change in bowel habits Skin: Denies abnormal skin rashes Lymphatics: Denies new lymphadenopathy or easy bruising Neurological:Denies numbness, tingling or new weaknesses Behavioral/Psych: Mood is stable, no new changes  All other systems were reviewed with the patient and are negative.  I have reviewed the past medical history, past surgical  history, social history and family history with the patient and they are unchanged from previous note.  ALLERGIES:  is allergic to codeine, lactulose, and latex.  MEDICATIONS:  Current Outpatient Medications  Medication Sig Dispense Refill  . apixaban (ELIQUIS) 5 MG TABS tablet Take 1 tablet (5 mg total) by mouth 2 (two) times daily. 60 tablet 5  . atorvastatin (LIPITOR) 40 MG tablet Take 40 mg by mouth at bedtime.    . gabapentin (NEURONTIN) 600 MG tablet TAKE 1 TABLET(600 MG) BY MOUTH TWICE DAILY 60 tablet 11  . lidocaine-prilocaine (EMLA) cream Apply to Porta-Cath 1-2 hours prior to access as directed. 30 g 11  . metoprolol succinate (TOPROL-XL) 25 MG 24 hr tablet Take 25 mg by mouth daily.   5  . Multiple Vitamin (MULTIVITAMIN WITH MINERALS) TABS tablet Take 1 tablet by mouth daily.    Marland Kitchen omega-3 acid ethyl esters (LOVAZA) 1 g capsule Take 1 g by mouth daily.    . ondansetron (ZOFRAN ODT) 4 MG disintegrating tablet Take 1 tablet (4 mg total) by mouth every 8 (eight) hours as needed for nausea or vomiting. 60 tablet 9  . Oxycodone HCl 10 MG TABS Take 1 tablet (10 mg total) by mouth every 6 (six) hours as needed. 60 tablet 0  . PARoxetine (PAXIL) 40 MG tablet Take 40 mg by mouth daily.  0  . polyethylene  glycol (MIRALAX / GLYCOLAX) packet Take 17 g by mouth daily.     No current facility-administered medications for this visit.    PHYSICAL EXAMINATION: ECOG PERFORMANCE STATUS: 1 - Symptomatic but completely ambulatory  Vitals:   11/08/20 0851  BP: 119/67  Pulse: 87  Resp: 19  Temp: 98.6 F (37 C)  SpO2: 100%   Filed Weights   11/08/20 0851  Weight: 198 lb 3.2 oz (89.9 kg)    GENERAL:alert, no distress and comfortable NEURO: alert & oriented x 3 with fluent speech, no focal motor/sensory deficits  LABORATORY DATA:  I have reviewed the data as listed    Component Value Date/Time   NA 140 10/18/2020 1006   NA 142 12/08/2017 1051   K 3.7 10/18/2020 1006   K 4.4  12/08/2017 1051   CL 105 10/18/2020 1006   CL 105 06/14/2017 1430   CO2 27 10/18/2020 1006   CO2 28 12/08/2017 1051   GLUCOSE 96 10/18/2020 1006   GLUCOSE 97 12/08/2017 1051   BUN 9 10/18/2020 1006   BUN 16.2 12/08/2017 1051   CREATININE 0.73 10/18/2020 1006   CREATININE 0.72 07/18/2020 1141   CREATININE 0.9 12/08/2017 1051   CALCIUM 9.2 10/18/2020 1006   CALCIUM 9.5 12/08/2017 1051   PROT 7.0 10/18/2020 1006   PROT 6.9 12/08/2017 1051   ALBUMIN 3.0 (L) 10/18/2020 1006   ALBUMIN 3.6 12/08/2017 1051   AST 41 10/18/2020 1006   AST 26 07/18/2020 1141   AST 18 12/08/2017 1051   ALT 32 10/18/2020 1006   ALT 25 07/18/2020 1141   ALT 20 12/08/2017 1051   ALKPHOS 400 (H) 10/18/2020 1006   ALKPHOS 131 12/08/2017 1051   BILITOT 0.6 10/18/2020 1006   BILITOT 0.5 07/18/2020 1141   BILITOT 0.70 12/08/2017 1051   GFRNONAA >60 10/18/2020 1006   GFRNONAA >60 07/18/2020 1141   GFRAA >60 08/30/2020 1105   GFRAA >60 07/18/2020 1141    No results found for: SPEP, UPEP  Lab Results  Component Value Date   WBC 6.4 11/08/2020   NEUTROABS 3.1 11/08/2020   HGB 11.6 (L) 11/08/2020   HCT 34.7 (L) 11/08/2020   MCV 104.8 (H) 11/08/2020   PLT 317 11/08/2020      Chemistry      Component Value Date/Time   NA 140 10/18/2020 1006   NA 142 12/08/2017 1051   K 3.7 10/18/2020 1006   K 4.4 12/08/2017 1051   CL 105 10/18/2020 1006   CL 105 06/14/2017 1430   CO2 27 10/18/2020 1006   CO2 28 12/08/2017 1051   BUN 9 10/18/2020 1006   BUN 16.2 12/08/2017 1051   CREATININE 0.73 10/18/2020 1006   CREATININE 0.72 07/18/2020 1141   CREATININE 0.9 12/08/2017 1051      Component Value Date/Time   CALCIUM 9.2 10/18/2020 1006   CALCIUM 9.5 12/08/2017 1051   ALKPHOS 400 (H) 10/18/2020 1006   ALKPHOS 131 12/08/2017 1051   AST 41 10/18/2020 1006   AST 26 07/18/2020 1141   AST 18 12/08/2017 1051   ALT 32 10/18/2020 1006   ALT 25 07/18/2020 1141   ALT 20 12/08/2017 1051   BILITOT 0.6 10/18/2020  1006   BILITOT 0.5 07/18/2020 1141   BILITOT 0.70 12/08/2017 1051

## 2020-11-08 NOTE — Progress Notes (Signed)
Patient discharged in stable condition.

## 2020-11-08 NOTE — Assessment & Plan Note (Signed)
She has lost her weight recently She claims she is eating well I will adjust the dose of her chemotherapy accordingly

## 2020-11-08 NOTE — Patient Instructions (Signed)
[facility] Discharge Instructions for Patients Receiving Chemotherapy  Today you received the following chemotherapy agents: topotecan.  To help prevent nausea and vomiting after your treatment, we encourage you to take your nausea medication as directed.   If you develop nausea and vomiting that is not controlled by your nausea medication, call the clinic.   BELOW ARE SYMPTOMS THAT SHOULD BE REPORTED IMMEDIATELY:  *FEVER GREATER THAN 100.5 F  *CHILLS WITH OR WITHOUT FEVER  NAUSEA AND VOMITING THAT IS NOT CONTROLLED WITH YOUR NAUSEA MEDICATION  *UNUSUAL SHORTNESS OF BREATH  *UNUSUAL BRUISING OR BLEEDING  TENDERNESS IN MOUTH AND THROAT WITH OR WITHOUT PRESENCE OF ULCERS  *URINARY PROBLEMS  *BOWEL PROBLEMS  UNUSUAL RASH Items with * indicate a potential emergency and should be followed up as soon as possible.  Feel free to call the clinic should you have any questions or concerns. The clinic phone number is [phone].  Please show the CHEMO ALERT CARD at check-in to the Emergency Department and triage nurse.   

## 2020-11-08 NOTE — Patient Instructions (Signed)

## 2020-11-08 NOTE — Assessment & Plan Note (Signed)
So far, she have no new symptoms With recent treatment break, the pancytopenia has improved Per previous discussion, we will continue treatment every other week Due to recent weight loss, I adjusted the dose of her chemotherapy a little bit

## 2020-11-09 LAB — CA 125: Cancer Antigen (CA) 125: 8033 U/mL — ABNORMAL HIGH (ref 0.0–38.1)

## 2020-11-11 ENCOUNTER — Telehealth: Payer: Self-pay | Admitting: Hematology and Oncology

## 2020-11-11 NOTE — Telephone Encounter (Signed)
Scheduled appt per 12/3 sch msg - pt daughter aware of appts added.

## 2020-11-22 ENCOUNTER — Inpatient Hospital Stay: Payer: Medicare (Managed Care)

## 2020-11-22 ENCOUNTER — Other Ambulatory Visit: Payer: Self-pay

## 2020-11-22 ENCOUNTER — Other Ambulatory Visit: Payer: Self-pay | Admitting: Hematology and Oncology

## 2020-11-22 VITALS — BP 120/88 | HR 80 | Temp 98.6°F | Resp 18

## 2020-11-22 DIAGNOSIS — Z95828 Presence of other vascular implants and grafts: Secondary | ICD-10-CM

## 2020-11-22 DIAGNOSIS — C562 Malignant neoplasm of left ovary: Secondary | ICD-10-CM

## 2020-11-22 DIAGNOSIS — Z7189 Other specified counseling: Secondary | ICD-10-CM

## 2020-11-22 DIAGNOSIS — Z5111 Encounter for antineoplastic chemotherapy: Secondary | ICD-10-CM | POA: Diagnosis not present

## 2020-11-22 DIAGNOSIS — C563 Malignant neoplasm of bilateral ovaries: Secondary | ICD-10-CM

## 2020-11-22 DIAGNOSIS — C800 Disseminated malignant neoplasm, unspecified: Secondary | ICD-10-CM

## 2020-11-22 LAB — COMPREHENSIVE METABOLIC PANEL
ALT: 58 U/L — ABNORMAL HIGH (ref 0–44)
AST: 92 U/L — ABNORMAL HIGH (ref 15–41)
Albumin: 2.9 g/dL — ABNORMAL LOW (ref 3.5–5.0)
Alkaline Phosphatase: 952 U/L — ABNORMAL HIGH (ref 38–126)
Anion gap: 8 (ref 5–15)
BUN: 14 mg/dL (ref 8–23)
CO2: 28 mmol/L (ref 22–32)
Calcium: 9.6 mg/dL (ref 8.9–10.3)
Chloride: 103 mmol/L (ref 98–111)
Creatinine, Ser: 0.77 mg/dL (ref 0.44–1.00)
GFR, Estimated: >60 mL/min (ref >60)
Glucose, Bld: 106 mg/dL — ABNORMAL HIGH (ref 70–99)
Potassium: 4 mmol/L (ref 3.5–5.1)
Sodium: 139 mmol/L (ref 135–145)
Total Bilirubin: 0.9 mg/dL (ref 0.3–1.2)
Total Protein: 7.7 g/dL (ref 6.5–8.1)

## 2020-11-22 LAB — CBC WITH DIFFERENTIAL/PLATELET
Abs Immature Granulocytes: 0.02 10*3/uL (ref 0.00–0.07)
Basophils Absolute: 0 10*3/uL (ref 0.0–0.1)
Basophils Relative: 1 %
Eosinophils Absolute: 0.5 10*3/uL (ref 0.0–0.5)
Eosinophils Relative: 10 %
HCT: 33.1 % — ABNORMAL LOW (ref 36.0–46.0)
Hemoglobin: 11.1 g/dL — ABNORMAL LOW (ref 12.0–15.0)
Immature Granulocytes: 0 %
Lymphocytes Relative: 27 %
Lymphs Abs: 1.4 10*3/uL (ref 0.7–4.0)
MCH: 34.9 pg — ABNORMAL HIGH (ref 26.0–34.0)
MCHC: 33.5 g/dL (ref 30.0–36.0)
MCV: 104.1 fL — ABNORMAL HIGH (ref 80.0–100.0)
Monocytes Absolute: 0.6 10*3/uL (ref 0.1–1.0)
Monocytes Relative: 12 %
Neutro Abs: 2.6 10*3/uL (ref 1.7–7.7)
Neutrophils Relative %: 50 %
Platelets: 272 10*3/uL (ref 150–400)
RBC: 3.18 MIL/uL — ABNORMAL LOW (ref 3.87–5.11)
RDW: 16 % — ABNORMAL HIGH (ref 11.5–15.5)
WBC: 5.1 10*3/uL (ref 4.0–10.5)
nRBC: 0 % (ref 0.0–0.2)

## 2020-11-22 MED ORDER — PROCHLORPERAZINE MALEATE 10 MG PO TABS
ORAL_TABLET | ORAL | Status: AC
  Filled 2020-11-22: qty 2

## 2020-11-22 MED ORDER — SODIUM CHLORIDE 0.9% FLUSH
10.0000 mL | INTRAVENOUS | Status: DC | PRN
  Administered 2020-11-22: 10 mL
  Filled 2020-11-22: qty 10

## 2020-11-22 MED ORDER — PROCHLORPERAZINE MALEATE 10 MG PO TABS
10.0000 mg | ORAL_TABLET | Freq: Once | ORAL | Status: AC
  Administered 2020-11-22: 10 mg via ORAL

## 2020-11-22 MED ORDER — SODIUM CHLORIDE 0.9 % IV SOLN
3.0000 mg/m2 | Freq: Once | INTRAVENOUS | Status: DC
Start: 2020-11-22 — End: 2020-11-22

## 2020-11-22 MED ORDER — SODIUM CHLORIDE 0.9 % IV SOLN
Freq: Once | INTRAVENOUS | Status: AC
  Filled 2020-11-22: qty 250

## 2020-11-22 MED ORDER — TOPOTECAN HCL CHEMO INJECTION 4 MG
2.4000 mg/m2 | Freq: Once | INTRAVENOUS | Status: AC
  Administered 2020-11-22: 5.1 mg via INTRAVENOUS
  Filled 2020-11-22: qty 5.1

## 2020-11-22 MED ORDER — SODIUM CHLORIDE 0.9% FLUSH
10.0000 mL | Freq: Once | INTRAVENOUS | Status: AC
  Administered 2020-11-22: 10 mL
  Filled 2020-11-22: qty 10

## 2020-11-22 MED ORDER — HEPARIN SOD (PORK) LOCK FLUSH 100 UNIT/ML IV SOLN
500.0000 [IU] | Freq: Once | INTRAVENOUS | Status: AC | PRN
  Administered 2020-11-22: 500 [IU]
  Filled 2020-11-22: qty 5

## 2020-11-22 NOTE — Progress Notes (Signed)
Dr. Alvy Bimler wants dose of Topotecan reduced today.  Fatigued & not tolerating.  Kennith Center, Pharm.D., CPP 11/22/2020@1 :23 PM

## 2020-11-22 NOTE — Patient Instructions (Signed)
[facility] Discharge Instructions for Patients Receiving Chemotherapy  Today you received the following chemotherapy agents: Topotecan  To help prevent nausea and vomiting after your treatment, we encourage you to take your nausea medication  as prescribed.    If you develop nausea and vomiting that is not controlled by your nausea medication, call the clinic.   BELOW ARE SYMPTOMS THAT SHOULD BE REPORTED IMMEDIATELY:  *FEVER GREATER THAN 100.5 F  *CHILLS WITH OR WITHOUT FEVER  NAUSEA AND VOMITING THAT IS NOT CONTROLLED WITH YOUR NAUSEA MEDICATION  *UNUSUAL SHORTNESS OF BREATH  *UNUSUAL BRUISING OR BLEEDING  TENDERNESS IN MOUTH AND THROAT WITH OR WITHOUT PRESENCE OF ULCERS  *URINARY PROBLEMS  *BOWEL PROBLEMS  UNUSUAL RASH Items with * indicate a potential emergency and should be followed up as soon as possible.  Feel free to call the clinic should you have any questions or concerns. The clinic phone number is [phone].  Please show the [facility] at check-in to the Emergency Department and triage nurse.

## 2020-11-27 ENCOUNTER — Other Ambulatory Visit: Payer: Self-pay | Admitting: Hematology and Oncology

## 2020-11-27 ENCOUNTER — Telehealth: Payer: Self-pay

## 2020-11-27 MED ORDER — OXYCODONE HCL 10 MG PO TABS: 10.0000 mg | ORAL_TABLET | Freq: Four times a day (QID) | ORAL | 0 refills | Status: AC | PRN

## 2020-11-27 NOTE — Telephone Encounter (Signed)
Refill sent.

## 2020-11-27 NOTE — Telephone Encounter (Signed)
Pt's daughter, Kenney Houseman, called requesting refill on patient's pain medication, Oxycodone 10mg  tablets.   Will forward to provider for review/approval.

## 2020-12-13 ENCOUNTER — Other Ambulatory Visit: Payer: Self-pay

## 2020-12-13 ENCOUNTER — Inpatient Hospital Stay: Payer: Medicare Other

## 2020-12-13 ENCOUNTER — Inpatient Hospital Stay: Payer: Medicare Other | Attending: Hematology and Oncology | Admitting: Hematology and Oncology

## 2020-12-13 ENCOUNTER — Encounter: Payer: Self-pay | Admitting: Hematology and Oncology

## 2020-12-13 DIAGNOSIS — D649 Anemia, unspecified: Secondary | ICD-10-CM | POA: Insufficient documentation

## 2020-12-13 DIAGNOSIS — C563 Malignant neoplasm of bilateral ovaries: Secondary | ICD-10-CM | POA: Diagnosis not present

## 2020-12-13 DIAGNOSIS — R5381 Other malaise: Secondary | ICD-10-CM | POA: Insufficient documentation

## 2020-12-13 DIAGNOSIS — R109 Unspecified abdominal pain: Secondary | ICD-10-CM | POA: Diagnosis not present

## 2020-12-13 DIAGNOSIS — C562 Malignant neoplasm of left ovary: Secondary | ICD-10-CM

## 2020-12-13 DIAGNOSIS — R1084 Generalized abdominal pain: Secondary | ICD-10-CM | POA: Diagnosis not present

## 2020-12-13 DIAGNOSIS — Z9119 Patient's noncompliance with other medical treatment and regimen: Secondary | ICD-10-CM | POA: Insufficient documentation

## 2020-12-13 DIAGNOSIS — R948 Abnormal results of function studies of other organs and systems: Secondary | ICD-10-CM | POA: Insufficient documentation

## 2020-12-13 DIAGNOSIS — G893 Neoplasm related pain (acute) (chronic): Secondary | ICD-10-CM | POA: Insufficient documentation

## 2020-12-13 DIAGNOSIS — C786 Secondary malignant neoplasm of retroperitoneum and peritoneum: Secondary | ICD-10-CM | POA: Insufficient documentation

## 2020-12-13 DIAGNOSIS — Z9221 Personal history of antineoplastic chemotherapy: Secondary | ICD-10-CM | POA: Diagnosis not present

## 2020-12-13 DIAGNOSIS — R634 Abnormal weight loss: Secondary | ICD-10-CM | POA: Insufficient documentation

## 2020-12-13 DIAGNOSIS — I700 Atherosclerosis of aorta: Secondary | ICD-10-CM | POA: Diagnosis not present

## 2020-12-13 DIAGNOSIS — Z79899 Other long term (current) drug therapy: Secondary | ICD-10-CM | POA: Insufficient documentation

## 2020-12-13 DIAGNOSIS — R978 Other abnormal tumor markers: Secondary | ICD-10-CM | POA: Diagnosis not present

## 2020-12-13 DIAGNOSIS — R748 Abnormal levels of other serum enzymes: Secondary | ICD-10-CM | POA: Diagnosis not present

## 2020-12-13 DIAGNOSIS — D638 Anemia in other chronic diseases classified elsewhere: Secondary | ICD-10-CM | POA: Diagnosis not present

## 2020-12-13 DIAGNOSIS — C787 Secondary malignant neoplasm of liver and intrahepatic bile duct: Secondary | ICD-10-CM | POA: Diagnosis not present

## 2020-12-13 DIAGNOSIS — Z95828 Presence of other vascular implants and grafts: Secondary | ICD-10-CM

## 2020-12-13 LAB — COMPREHENSIVE METABOLIC PANEL
ALT: 47 U/L — ABNORMAL HIGH (ref 0–44)
AST: 110 U/L — ABNORMAL HIGH (ref 15–41)
Albumin: 2.6 g/dL — ABNORMAL LOW (ref 3.5–5.0)
Alkaline Phosphatase: 1600 U/L — ABNORMAL HIGH (ref 38–126)
Anion gap: 9 (ref 5–15)
BUN: 11 mg/dL (ref 8–23)
CO2: 24 mmol/L (ref 22–32)
Calcium: 9.4 mg/dL (ref 8.9–10.3)
Chloride: 103 mmol/L (ref 98–111)
Creatinine, Ser: 1.05 mg/dL — ABNORMAL HIGH (ref 0.44–1.00)
GFR, Estimated: 57 mL/min — ABNORMAL LOW (ref >60)
Glucose, Bld: 122 mg/dL — ABNORMAL HIGH (ref 70–99)
Potassium: 3.7 mmol/L (ref 3.5–5.1)
Sodium: 136 mmol/L (ref 135–145)
Total Bilirubin: 1.6 mg/dL — ABNORMAL HIGH (ref 0.3–1.2)
Total Protein: 7.7 g/dL (ref 6.5–8.1)

## 2020-12-13 LAB — CBC WITH DIFFERENTIAL/PLATELET
Abs Immature Granulocytes: 0.02 10*3/uL (ref 0.00–0.07)
Basophils Absolute: 0 10*3/uL (ref 0.0–0.1)
Basophils Relative: 1 %
Eosinophils Absolute: 0.3 10*3/uL (ref 0.0–0.5)
Eosinophils Relative: 4 %
HCT: 35.5 % — ABNORMAL LOW (ref 36.0–46.0)
Hemoglobin: 11.7 g/dL — ABNORMAL LOW (ref 12.0–15.0)
Immature Granulocytes: 0 %
Lymphocytes Relative: 21 %
Lymphs Abs: 1.6 10*3/uL (ref 0.7–4.0)
MCH: 33.7 pg (ref 26.0–34.0)
MCHC: 33 g/dL (ref 30.0–36.0)
MCV: 102.3 fL — ABNORMAL HIGH (ref 80.0–100.0)
Monocytes Absolute: 1 10*3/uL (ref 0.1–1.0)
Monocytes Relative: 14 %
Neutro Abs: 4.4 10*3/uL (ref 1.7–7.7)
Neutrophils Relative %: 60 %
Platelets: 360 10*3/uL (ref 150–400)
RBC: 3.47 MIL/uL — ABNORMAL LOW (ref 3.87–5.11)
RDW: 17.8 % — ABNORMAL HIGH (ref 11.5–15.5)
WBC: 7.3 10*3/uL (ref 4.0–10.5)
nRBC: 0 % (ref 0.0–0.2)

## 2020-12-13 MED ORDER — SODIUM CHLORIDE 0.9% FLUSH
10.0000 mL | Freq: Once | INTRAVENOUS | Status: AC
  Administered 2020-12-13: 10 mL
  Filled 2020-12-13: qty 10

## 2020-12-13 NOTE — Assessment & Plan Note (Signed)
Her performance status has declined rapidly She is not dependent on family on all activities of daily living The patient is wondering whether she can get home health aide Unfortunately, her insurance will not cover the cost For now, her family will help with her activities of daily living

## 2020-12-13 NOTE — Assessment & Plan Note (Signed)
She has significant elevated liver enzymes I am concerned this represented disease progression We will hold treatment today and order CT imaging next week

## 2020-12-13 NOTE — Progress Notes (Signed)
[place] OFFICE PROGRESS NOTE  Patient Care Team: Helane Rima, MD as PCP - General (Family Medicine) Lonia Mad, MD as Referring Physician (Neurology) Cameron Sprang, MD as Consulting Physician (Neurology)  ASSESSMENT & PLAN:  Ovarian cancer [facility]) I am concerned about her rising tumor markers Clinically, her performance status has gone downhill She is getting weaker and has more pain and has lost weight Overall, I am concerned her disease is progressing Plan to cancel her treatment today and schedule CT imaging next week She is in agreement  Anemia, chronic disease This is likely due to recent treatment. The patient denies recent history of bleeding such as epistaxis, hematuria or hematochezia. She is asymptomatic from the anemia. I will observe for now.  Elevated liver enzymes She has significant elevated liver enzymes I am concerned this represented disease progression We will hold treatment today and order CT imaging next week  Abdominal pain She is somewhat noncompliant with recommendation taking oxycodone I recommend her to take oxycodone as needed to get her pain under control  Physical debility Her performance status has declined rapidly She is not dependent on family on all activities of daily living The patient is wondering whether she can get home health aide Unfortunately, her insurance will not cover the cost For now, her family will help with her activities of daily living   Orders Placed This Encounter  Procedures  . CT CHEST ABDOMEN PELVIS W CONTRAST    Standing Status:   Future    Standing Expiration Date:   12/13/2021    Order Specific Question:   Preferred imaging location?    Answer:   [facility]    Order Specific Question:   Radiology Contrast Protocol - do NOT remove file path    Answer:   [email or web address]    All questions were answered. The patient knows to call the clinic  with any problems, questions or concerns. The total time spent in the appointment was 40 minutes encounter with patients including review of chart and various tests results, discussions about plan of care and coordination of care plan   Heath Lark, MD 12/13/2020 1:00 PM  INTERVAL HISTORY: Please see below for problem oriented charting. She returns with her daughter, Kenney Houseman for further follow-up She is supposed to get chemotherapy today Since last time I saw her, she has significant performance status decline She has lost 10 pounds She is getting weaker and is dependent on family for all self-care including getting dressed She stays in bed all day She is also complaining of worsening pain She is prescribed oxycodone to take as needed but only take 1 a day When family members are for her more pain medicine, the patient declined She is also quite stubborn, according to her daughter She would not seek help if needed but yet, today, she is asking whether she can get home health aide to come to her house and help her with activities of daily living She denies constipation SUMMARY OF ONCOLOGIC HISTORY: Oncology History Overview Note  Negative genetic testing Progressed on Niraparib, gemzar and carboplatin   Carcinomatosis ([place])  12/06/2015 Initial Diagnosis   Carcinomatosis ([place])   07/18/2020 - 08/30/2020 Chemotherapy   The patient had carboplatin for chemotherapy treatment.     09/20/2020 -  Chemotherapy   The patient had topotecan (HYCAMTIN) 6.8 mg in sodium chloride 0.9 % 100 mL chemo infusion, 3.2 mg/m2 = 6.8 mg (80 % of original dose 4 mg/m2), Intravenous,  Once,  3 of 4 cycles Dose modification: 3.2 mg/m2 (80 % of original dose 4 mg/m2, Cycle 1, Reason: Provider Judgment), 3 mg/m2 (75 % of original dose 4 mg/m2, Cycle 1, Reason: Dose Not Tolerated), 2.4 mg/m2 (60 % of original dose 4 mg/m2, Cycle 3, Reason: Change in LFTs) Administration: 6.8 mg (09/20/2020), 6.4 mg (09/27/2020), 6.4 mg  (10/11/2020), 6.3 mg (11/08/2020), 5.1 mg (11/22/2020)  for chemotherapy treatment.    Ovarian cancer, bilateral  12/24/2015 Initial Diagnosis   Ovarian cancer, bilateral ([place])   07/18/2020 - 08/30/2020 Chemotherapy   The patient had carboplatin for chemotherapy treatment.     09/20/2020 -  Chemotherapy   The patient had topotecan (HYCAMTIN) 6.8 mg in sodium chloride 0.9 % 100 mL chemo infusion, 3.2 mg/m2 = 6.8 mg (80 % of original dose 4 mg/m2), Intravenous,  Once, 3 of 4 cycles Dose modification: 3.2 mg/m2 (80 % of original dose 4 mg/m2, Cycle 1, Reason: Provider Judgment), 3 mg/m2 (75 % of original dose 4 mg/m2, Cycle 1, Reason: Dose Not Tolerated), 2.4 mg/m2 (60 % of original dose 4 mg/m2, Cycle 3, Reason: Change in LFTs) Administration: 6.8 mg (09/20/2020), 6.4 mg (09/27/2020), 6.4 mg (10/11/2020), 6.3 mg (11/08/2020), 5.1 mg (11/22/2020)  for chemotherapy treatment.    Ovarian cancer ([place])  11/28/2015 Imaging   Ct scan of abdomen: Peritoneal carcinomatosis and pelvic/inguinal adenopathy. Gynecologic primary is favored.   12/17/2015 Pathology Results   Lymph node, needle/core biopsy, L inguinal LAN - METASTATIC ADENOCARCINOMA. Microscopic Comment Immunohistochemistry will be performed and reported as an addendum. (JDP:ecj 12/18/2015) ADDENDUM: Immunohistochemistry shows strong positivity with cytokeratin 7, estrogen receptor (ER), progesterone receptor (PR), p53 and WT1. Negative markers are cytokeratin 20 , CDX- 2 and gross cystic disease fluid protein. 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 (JDP:kh 12-19-15   12/17/2015 Procedure   Technically successful ultrasound guided core left inguinal adenopathy biopsy   12/24/2015 Tumor Marker   Patient's tumor was tested for the following markers: CA-125 Results of the tumor marker test revealed 608.2   12/27/2015 - 02/28/2016 Chemotherapy   She  received neoadjuvant chemo x 3 cycles   01/01/2016 Tumor Marker   Patient's tumor was tested for the following markers: CA-125 Results of the tumor marker test revealed 741.6   01/29/2016 Procedure   Successful placement of a right internal jugular approach power injectable Port-A-Cath. The catheter is ready for immediate use   01/29/2016 Imaging   US abdomen 1. Hyperechoic 2.5 x 1.0 x 1.6 cm lesion in the region the caudate lobe of the liver. Similar finding noted on prior CT of 11/28/2015. This could represent hemangioma. This could represent a malignancy including metastatic disease. 2. Exam otherwise unremarkable. No gallstones. No biliary distention.   02/03/2016 Tumor Marker   Patient's tumor was tested for the following markers: CA-125 Results of the tumor marker test revealed 137.1   02/20/2016 Imaging   MRI brain No acute infarct.  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  No intracranial mass.  Moderate small vessel disease changes.  Global atrophy without hydrocephalus.  Expanded partially empty sella without secondary findings of pseudotumor cerebri.  Minimal mucosal thickening ethmoid sinus air cells. Small air-fluid levels maxillary sinuses bilaterally may indicate changes of acute sinusitis.    02/28/2016 Tumor Marker   Patient's tumor was tested for the following markers: CA-125 Results of the tumor marker test revealed 33.6   03/02/2016 Imaging   CT chest,  abdomen and pelvis 1. Today's study demonstrates a positive response to therapy with resolution of the previously noted malignant ascites, significant regression of previously noted peritoneal implants, and regression of previously noted lymphadenopathy in the abdomen and pelvis. 2. No definite evidence to suggest metastatic disease to the thorax. 3. Stable 1.0 x 1.7 cm intermediate attenuation lesion associated with the posterior aspect of segment 1 of the liver, favored to  represent a mildly proteinaceous hepatic cyst. The possibility of a peritoneal implant in this region is not entirely excluded, but is not favored on today's examination. 4. Extensive post infectious scarring inguinal upper right lung, likely sequela of prior necrotizing pneumonia. 5. Multiple tiny pulmonary nodules scattered throughout the lungs bilaterally all measuring 4 mm or less. These are nonspecific, but favored to be benign. Given the patient's history of primary gynecologic malignancy, attention on followup studies is recommended to ensure the stability of these findings. 6. Additional incidental findings, as above   03/14/2016 Imaging   CT angiogram 1. No pulmonary emboli. 2. Chronic changes to the right upper lobe. 3. Stable lesion in the caudate lobe of the liver. 4. Stable soft tissue prominence to the right of the trachea as described above. 5. Stable small nodule in the right lung.    03/24/2016 Pathology Results   1. Omentum, resection for tumor - FOCAL ADENOCARCINOMA ASSOCIATED WITH EXTENSIVE FIBROSIS, INFLAMMATION AND HEMOSIDERIN DEPOSITION. 2. Uterus +/- tubes/ovaries, neoplastic, cervix - CERVIX: SLIGHT CERVICITIS AND SQUAMOUS METAPLASIA. - ENDOMETRIUM: ATROPHIC, NO HYPERPLASIA OR MALIGNANCY. - MYOMETRIUM: LEIOMYOMATA WITH DEGENERATIVE CHANGES. NO MALIGNANCY. - UTERINE SEROSA: FOCAL ADENOCARCINOMA ASSOCIATED WITH ADHESIONS. - RIGHT OVARY: BENIGN CALCIFIED NODULE AND BENIGN SEROUS CYST. NO MALIGNANCY IDENTIFIED. - RIGHT FALLOPIAN TUBE: UNREMARKABLE. NO MALIGNANCY IDENTIFIED. - LEFT OVARY: FOCAL ADENOCARCINOMA ASSOCIATED WITH EXTENSIVE FIBROSIS. - LEFT FALLOPIAN TUBE: HYDROSALPINX. NO MALIGNANCY IDENTIFIED. 3. Lymph node, biopsy, right external iliac - METASTATIC ADENOCARCINOMA, 4. Soft tissue, biopsy, left para colic gutter - FOCAL ADENOCARCINOMA ASSOCIATED WITH FIBROSIS. Microscopic Comment 2. OVARY Specimen(s): Uterus with bilateral ovaries, omentum, right external  iliac lymph node and left paracolic gutter. Procedure: (including lymph node sampling) Hysterectomy with bilateral salpingo-oophorectomy, omentectomy, lymph node biopsy and paracolic gutter biopsy. Primary tumor site (including laterality): Left ovary. Ovarian surface involvement: Yes Ovarian capsule intact without fragmentation: N/A Maximum tumor size (cm): 2 cm, 2 cm Histologic type: Serous carcinoma. Grade: 3 Peritoneal implants: (specify invasive or non-invasive): Left paracolic gutter positive for carcinoma Pelvic extension (list additional structures on separate lines and if involved): Omentum, right paracolic gutter and uterine serosa. Lymph nodes: number examined 1 ; number positive 1 TNM code: ypT3a, ypN1b, ypMX FIGO Stage (based on pathologic findings, needs clinical correlation): IIIA2 Comments: The left ovary has a 2 cm nodule, which is composed primarily of fibrous tissue with small foci of residual adenocarcinoma. There is also microscopic involvement of the uterine serosa, the left paracolic gutter biopsy and the omentum. The left paracolic gutter and omental involvement is microscopic and both are associated with extensive fibrosis with focal hemosiderin deposition. The right external iliac node is completely replaced by metastatic adenocarcinoma with serous features. (JDP:ecj 03/30/2016)   03/24/2016 Surgery   Operation: Robotic-assisted laparoscopic total hysterectomy with bilateral salpingoophorectomy, omentectomy, radical tumor debulking  Surgeon: Donaciano Eva  Operative Findings:  : small nodules in omentum, no ascites. Omental nodule (2cm) adherent to lower anterior abdominal wall. 2cm left colonic gutter cystic nodule. 6cm right external iliac lymph node. Grossly normal ovaries and tubes. Fibroid uterus. No gross residual  disease at completion of surgery representing R0 optimal/complete resection.    04/17/2016 Imaging   CT abdomen and pelvis 1. There is no  evidence of acute inflammatory process within abdomen or pelvis. 2. Stable low-density lesion within caudate lobe of the liver. No new hepatic lesions are noted. 3. Again noted lobulated renal contour and multifocal renal cortical scarring. No hydronephrosis or hydroureter. 4. No significant mesenteric adenopathy. No retroperitoneal adenopathy. Stable minimal residual peritoneal thickening within pelvis. No new pelvic implants or pelvic ascites. 5. Status post hysterectomy.  Unremarkable urinary bladder. 6. Moderate stool within colon as described above. No evidence of colitis.    04/24/2016 - 06/19/2016 Chemotherapy   She received 3 more cycles of chemo after surgery   05/14/2016 Tumor Marker   Patient's tumor was tested for the following markers: CA-125 Results of the tumor marker test revealed 22.5   06/04/2016 Tumor Marker   Patient's tumor was tested for the following markers: CA-125 Results of the tumor marker test revealed 13.7   07/07/2016 Genetic Testing   Patient has genetic testing done for breast/ovasrian cancer panel Results revealed patient has no actionable mutation   07/23/2016 Imaging   Ct abdomen and pelvis 1. Heterogeneously enhancing 4.9 x 3.2 x 4.4 cm mass along the right pelvic sidewall may represent locally recurrent disease or malignant lymphadenopathy. 2. No other signs of definite metastatic disease noted elsewhere in the abdomen or pelvis. 3. Stable low-attenuation hepatic lesion in the caudate lobe of the liver, which appears to demonstrates some progressive centripetal filling on delayed images. This lesion is presumably benign given its stability compared to prior studies, and is favored to represent a small cavernous hemangioma. 4. Cardiomegaly with biatrial dilatation, which is very severe on the right side. 5. Aortic atherosclerosis. 6. Additional incidental findings, as above.   07/23/2016 Tumor Marker   Patient's tumor was tested for the following markers:  CA-125 Results of the tumor marker test revealed 12.3   09/30/2016 Imaging   Ct abdomen and pelvis: 1. 4.9 cm heterogeneously enhancing mass seen along the right pelvic sidewall previously has almost completely resolved. There is some ill-defined soft tissue attenuation/peritoneal thickening in this region today but no discrete measurable lesion is evident. 2. No new or progressive findings on today's exam. 3. Stable hypo attenuating lesion in the caudate lobe of the liver. 4. Subtle aortic atherosclerosis   09/30/2016 Tumor Marker   Patient's tumor was tested for the following markers: CA-125 Results of the tumor marker test revealed 9.6   10/05/2016 Pathology Results   Vagina, biopsy, left cuff - BENIGN FIBROEPITHELIAL (STROMAL) POLYP. - NO DYSPLASIA, ATYPIA OR MALIGNANCY IDENTIFIED.   01/14/2017 Tumor Marker   Patient's tumor was tested for the following markers: CA-125 Results of the tumor marker test revealed 11.9   05/05/2017 Tumor Marker   Patient's tumor was tested for the following markers: CA-125 Results of the tumor marker test revealed 28   06/14/2017 Tumor Marker   Patient's tumor was tested for the following markers: CA-125 Results of the tumor marker test revealed 45.7   06/24/2017 Imaging   Ct abdomen and pelvis: Increased peritoneal metastatic disease in abdomen and pelvis since prior exam. No evidence of ascites. Stable small benign hepatic hemangioma.   07/21/2017 Tumor Marker   Patient's tumor was tested for the following markers: CA-125 Results of the tumor marker test revealed 84.1   07/21/2017 - 11/03/2017 Chemotherapy   She received carboplatin and taxol   08/11/2017 Adverse Reaction   She  had severe neuropathy due to treatment. Does of chemo is reduced starting cycle 2 onwards   09/02/2017 Tumor Marker   Patient's tumor was tested for the following markers: CA-125 Results of the tumor marker test revealed 27.7   09/16/2017 Imaging   CT CHEST  IMPRESSION  1. No acute process or evidence of metastatic disease in the chest. 2. Right upper lung bronchiectasis, consolidation, and architectural distortion are most likely post infectious or inflammatory and not significantly changed. 3. Aortic Atherosclerosis (ICD10-I70.0).  CT ABDOMEN AND PELVIS IMPRESSION  1. Response to therapy of nodal and peritoneal metastasis. 2. No new or progressive disease. 3. Caudate lobe hemangioma, as before. 4. Bilateral renal cortical thinning. Similar minimal right-sided caliectasis and hydroureter. Likely secondary to low-grade obstruction by the dominant right pelvic implant.   09/22/2017 Tumor Marker   Patient's tumor was tested for the following markers: CA-125 Results of the tumor marker test revealed 18   11/03/2017 Tumor Marker   Patient's tumor was tested for the following markers: CA-125 Results of the tumor marker test revealed 13.4   11/25/2017 Imaging   1. Stable to slight interval decrease in size of nodal and peritoneal metastatic disease within the abdomen. 2. No new or progressive disease.   12/03/2017 - 09/11/2019 Chemotherapy   The patient is prescribed [place]   01/17/2018 Tumor Marker   Patient's tumor was tested for the following markers: CA-125 Results of the tumor marker test revealed 11.9   02/22/2018 Tumor Marker   Patient's tumor was tested for the following markers: CA-125 Results of the tumor marker test revealed 11.7   02/22/2018 Imaging   1. Improved nodular thickening along the vaginal cuff on the left and medial to the right iliac vessels, likely treated implants. No evidence of progressive peritoneal disease. 2. Stable hepatic hemangioma. No evidence of solid visceral organ metastases. 3. Bilateral renal cortical scarring.   05/19/2018 Tumor Marker   Patient's tumor was tested for the following markers: CA-125 Results of the tumor marker test revealed 9.9   08/10/2018 Imaging   Stable mild nodular thickening  of the left vaginal cuff. No new or progressive disease within the abdomen or pelvis.  Stable small hepatic hemangioma.   09/21/2018 Tumor Marker   Patient's tumor was tested for the following markers: CA-125 Results of the tumor marker test revealed 10.3   11/11/2018 Tumor Marker   Patient's tumor was tested for the following markers: CA-125 Results of the tumor marker test revealed 10.5   12/15/2018 Tumor Marker   Patient's tumor was tested for the following markers: CA-125 Results of the tumor marker test revealed 12.6   01/06/2019 Tumor Marker   Patient's tumor was tested for the following markers: CA-125 Results of the tumor marker test revealed 14.6   01/12/2019 Imaging   1. Chronic extensive right upper lobe scarring changes and bronchiectasis. 2. No mediastinal or hilar mass or adenopathy and no findings for pulmonary metastatic disease. 3. Stable caudate lobe hemangioma. 4. No worrisome omental or peritoneal surface lesions. 5. Stable pericecal and small pelvic lymph nodes. No new or progressive findings.    09/11/2019 Imaging   Ct abdomen and pelvis Pericecal peritoneal metastatic implants   09/19/2019 - 07/04/2020 Chemotherapy   The patient had carboplatin and gemxar for chemotherapy treatment, then on gemzar maintenance since 11/28/2019    11/20/2019 Imaging   1. Mild decrease in size of peritoneal tumor implant along the lateral aspect of the cecum. 2. Stable tiny sub-cm mesenteric soft tissue nodules  or lymph nodes in right lower quadrant and anterior pelvic mesentery. 3. No new or progressive disease identified within the abdomen or pelvis.   03/12/2020 Imaging   1. No acute findings within the abdomen or pelvis. 2. Stable appearance of right lower quadrant peritoneal implant adjacent to cecum. 3. Increase in size of right lower quadrant ileocolic lymph node. 1.2 cm on today's study versus 0.8 previously. 4. Possible early avascular necrosis involving the left  femoral head.   Aortic Atherosclerosis (ICD10-I70.0).   07/04/2020 Tumor Marker   Patient's tumor was tested for the following markers: CA-125 Results of the tumor marker test revealed 73   07/09/2020 Imaging   1. Enlarging noncalcified implants in the ileocolonic mesentery, suspicious for metastatic disease in this patient with a rising serum CA 125 level. There is a questionable new peritoneal implant associated with the distal small bowel. No resulting bowel obstruction. 2. No evidence of visceral metastatic disease. 3. Aortic Atherosclerosis (ICD10-I70.0).   07/18/2020 - 08/30/2020 Chemotherapy   The patient had carboplatin for chemotherapy treatment.     07/18/2020 Tumor Marker   Patient's tumor was tested for the following markers: CA-125 Results of the tumor marker test revealed 123   08/08/2020 Tumor Marker   Patient's tumor was tested for the following markers: CA-125 Results of the tumor marker test revealed 231   08/30/2020 Tumor Marker   Patient's tumor was tested for the following markers: CA-125 Results of the tumor marker test revealed 635   09/12/2020 Imaging   Mild increase in size of peritoneal metastasis in the right pelvis. Other small peritoneal nodules in right lower quadrant mesentery remain stable.    Stable soft tissue prominence involving the left vaginal cuff, suspicious for tumor.   No evidence of metastatic disease within the chest.     09/20/2020 -  Chemotherapy   The patient had topotecan (HYCAMTIN) 6.8 mg in sodium chloride 0.9 % 100 mL chemo infusion, 3.2 mg/m2 = 6.8 mg (80 % of original dose 4 mg/m2), Intravenous,  Once, 3 of 4 cycles Dose modification: 3.2 mg/m2 (80 % of original dose 4 mg/m2, Cycle 1, Reason: Provider Judgment), 3 mg/m2 (75 % of original dose 4 mg/m2, Cycle 1, Reason: Dose Not Tolerated), 2.4 mg/m2 (60 % of original dose 4 mg/m2, Cycle 3, Reason: Change in LFTs) Administration: 6.8 mg (09/20/2020), 6.4 mg (09/27/2020), 6.4 mg  (10/11/2020), 6.3 mg (11/08/2020), 5.1 mg (11/22/2020)  for chemotherapy treatment.    09/20/2020 Tumor Marker   Patient's tumor was tested for the following markers: CA-125 Results of the tumor marker test revealed 1288   10/11/2020 Tumor Marker   Patient's tumor was tested for the following markers: CA-125 Results of the tumor marker test revealed 1874   10/28/2020 Imaging   1. Redemonstrated soft tissue nodularity involving the terminal ileum and adjacent small bowel mesentery. 2. Unchanged soft tissue nodularity about the vaginal cuff status post hysterectomy. 3. No significant interval change in enlarged celiac axis and portacaval lymph nodes. 4. No evidence of new metastatic disease in the abdomen or pelvis. 5. New small right pleural effusion without definite evidence of metastatic involvement in the included portion of the lung bases, however this new effusion is highly suspicious for pulmonary or and/or pleural metastatic disease. Consider dedicated imaging of the chest.   11/08/2020 Tumor Marker   Patient's tumor was tested for the following markers: CA-125 Results of the tumor marker test revealed 8033     REVIEW OF SYSTEMS:   Constitutional: Denies  fevers, chills or Eyes: Denies blurriness of vision Ears, nose, mouth, throat, and face: Denies mucositis or sore throat Respiratory: Denies cough, dyspnea or wheezes Cardiovascular: Denies palpitation, chest discomfort or lower extremity swelling Gastrointestinal:  Denies nausea, heartburn or change in bowel habits Skin: Denies abnormal skin rashes Behavioral/Psych: Mood is stable, no new changes  All other systems were reviewed with the patient and are negative.  I have reviewed the past medical history, past surgical history, social history and family history with the patient and they are unchanged from previous note.  ALLERGIES:  is allergic to codeine, lactulose, and latex.  MEDICATIONS:  Current Outpatient Medications   Medication Sig Dispense Refill  . apixaban (ELIQUIS) 5 MG TABS tablet Take 1 tablet (5 mg total) by mouth 2 (two) times daily. 60 tablet 5  . atorvastatin (LIPITOR) 40 MG tablet Take 40 mg by mouth at bedtime.    . gabapentin (NEURONTIN) 600 MG tablet TAKE 1 TABLET(600 MG) BY MOUTH TWICE DAILY 60 tablet 11  . lidocaine-prilocaine (EMLA) cream Apply to Porta-Cath 1-2 hours prior to access as directed. 30 g 11  . metoprolol succinate (TOPROL-XL) 25 MG 24 hr tablet Take 25 mg by mouth daily.   5  . Multiple Vitamin (MULTIVITAMIN WITH MINERALS) TABS tablet Take 1 tablet by mouth daily.    Marland Kitchen omega-3 acid ethyl esters (LOVAZA) 1 g capsule Take 1 g by mouth daily.    . ondansetron (ZOFRAN ODT) 4 MG disintegrating tablet Take 1 tablet (4 mg total) by mouth every 8 (eight) hours as needed for nausea or vomiting. 60 tablet 9  . Oxycodone HCl 10 MG TABS Take 1 tablet (10 mg total) by mouth every 6 (six) hours as needed. 60 tablet 0  . PARoxetine (PAXIL) 40 MG tablet Take 40 mg by mouth daily.  0  . polyethylene glycol (MIRALAX / GLYCOLAX) packet Take 17 g by mouth daily.     No current facility-administered medications for this visit.    PHYSICAL EXAMINATION: ECOG PERFORMANCE STATUS: 2 - Symptomatic, <50% confined to bed  Vitals:   12/13/20 1101  BP: 112/69  Pulse: (!) 108  Resp: 18  Temp: (!) 97.4 F (36.3 C)  SpO2: 98%   Filed Weights   12/13/20 1101  Weight: 188 lb 3.2 oz (85.4 kg)    GENERAL:alert, no distress and comfortable Musculoskeletal:no cyanosis of digits and no clubbing  NEURO: alert & oriented x 3  LABORATORY DATA:  I have reviewed the data as listed    Component Value Date/Time   NA 136 12/13/2020 1039   NA 142 12/08/2017 1051   K 3.7 12/13/2020 1039   K 4.4 12/08/2017 1051   CL 103 12/13/2020 1039   CL 105 06/14/2017 1430   CO2 24 12/13/2020 1039   CO2 28 12/08/2017 1051   GLUCOSE 122 (H) 12/13/2020 1039   GLUCOSE 97 12/08/2017 1051   BUN 11 12/13/2020 1039    BUN 16.2 12/08/2017 1051   CREATININE 1.05 (H) 12/13/2020 1039   CREATININE 0.72 07/18/2020 1141   CREATININE 0.9 12/08/2017 1051   CALCIUM 9.4 12/13/2020 1039   CALCIUM 9.5 12/08/2017 1051   PROT 7.7 12/13/2020 1039   PROT 6.9 12/08/2017 1051   ALBUMIN 2.6 (L) 12/13/2020 1039   ALBUMIN 3.6 12/08/2017 1051   AST 110 (H) 12/13/2020 1039   AST 26 07/18/2020 1141   AST 18 12/08/2017 1051   ALT 47 (H) 12/13/2020 1039   ALT 25 07/18/2020 1141   ALT  20 12/08/2017 1051   ALKPHOS 1,600 (H) 12/13/2020 1039   ALKPHOS 131 12/08/2017 1051   BILITOT 1.6 (H) 12/13/2020 1039   BILITOT 0.5 07/18/2020 1141   BILITOT 0.70 12/08/2017 1051   GFRNONAA 57 (L) 12/13/2020 1039   GFRNONAA >60 07/18/2020 1141   GFRAA >60 08/30/2020 1105   GFRAA >60 07/18/2020 1141    No results found for: SPEP, UPEP  Lab Results  Component Value Date   WBC 7.3 12/13/2020   NEUTROABS 4.4 12/13/2020   HGB 11.7 (L) 12/13/2020   HCT 35.5 (L) 12/13/2020   MCV 102.3 (H) 12/13/2020   PLT 360 12/13/2020      Chemistry      Component Value Date/Time   NA 136 12/13/2020 1039   NA 142 12/08/2017 1051   K 3.7 12/13/2020 1039   K 4.4 12/08/2017 1051   CL 103 12/13/2020 1039   CL 105 06/14/2017 1430   CO2 24 12/13/2020 1039   CO2 28 12/08/2017 1051   BUN 11 12/13/2020 1039   BUN 16.2 12/08/2017 1051   CREATININE 1.05 (H) 12/13/2020 1039   CREATININE 0.72 07/18/2020 1141   CREATININE 0.9 12/08/2017 1051      Component Value Date/Time   CALCIUM 9.4 12/13/2020 1039   CALCIUM 9.5 12/08/2017 1051   ALKPHOS 1,600 (H) 12/13/2020 1039   ALKPHOS 131 12/08/2017 1051   AST 110 (H) 12/13/2020 1039   AST 26 07/18/2020 1141   AST 18 12/08/2017 1051   ALT 47 (H) 12/13/2020 1039   ALT 25 07/18/2020 1141   ALT 20 12/08/2017 1051   BILITOT 1.6 (H) 12/13/2020 1039   BILITOT 0.5 07/18/2020 1141   BILITOT 0.70 12/08/2017 1051

## 2020-12-13 NOTE — Assessment & Plan Note (Signed)
This is likely due to recent treatment. The patient denies recent history of bleeding such as epistaxis, hematuria or hematochezia. She is asymptomatic from the anemia. I will observe for now.   

## 2020-12-13 NOTE — Assessment & Plan Note (Signed)
She is somewhat noncompliant with recommendation taking oxycodone I recommend her to take oxycodone as needed to get her pain under control

## 2020-12-13 NOTE — Assessment & Plan Note (Addendum)
I am concerned about her rising tumor markers Clinically, her performance status has gone downhill She is getting weaker and has more pain and has lost weight Overall, I am concerned her disease is progressing Plan to cancel her treatment today and schedule CT imaging next week She is in agreement

## 2020-12-14 LAB — CA 125: Cancer Antigen (CA) 125: 14984 U/mL — ABNORMAL HIGH (ref 0.0–38.1)

## 2020-12-19 ENCOUNTER — Ambulatory Visit (HOSPITAL_COMMUNITY)
Admission: RE | Admit: 2020-12-19 | Discharge: 2020-12-19 | Disposition: A | Discharge status: Home / Self Care | Payer: Medicare Other | Source: Ambulatory Visit | Attending: Hematology and Oncology | Admitting: Hematology and Oncology

## 2020-12-19 ENCOUNTER — Other Ambulatory Visit: Payer: Self-pay

## 2020-12-19 DIAGNOSIS — C562 Malignant neoplasm of left ovary: Secondary | ICD-10-CM | POA: Diagnosis not present

## 2020-12-19 DIAGNOSIS — K6389 Other specified diseases of intestine: Secondary | ICD-10-CM | POA: Diagnosis not present

## 2020-12-19 DIAGNOSIS — C569 Malignant neoplasm of unspecified ovary: Secondary | ICD-10-CM | POA: Diagnosis not present

## 2020-12-19 DIAGNOSIS — K3189 Other diseases of stomach and duodenum: Secondary | ICD-10-CM | POA: Diagnosis not present

## 2020-12-19 DIAGNOSIS — K7689 Other specified diseases of liver: Secondary | ICD-10-CM | POA: Diagnosis not present

## 2020-12-19 MED ORDER — IOHEXOL 300 MG/ML  SOLN
100.0000 mL | Freq: Once | INTRAMUSCULAR | Status: AC | PRN
  Administered 2020-12-19: 100 mL via INTRAVENOUS

## 2020-12-20 ENCOUNTER — Other Ambulatory Visit: Payer: Self-pay

## 2020-12-20 ENCOUNTER — Encounter: Payer: Self-pay | Admitting: Hematology and Oncology

## 2020-12-20 ENCOUNTER — Inpatient Hospital Stay (HOSPITAL_BASED_OUTPATIENT_CLINIC_OR_DEPARTMENT_OTHER): Payer: Medicare Other | Admitting: Hematology and Oncology

## 2020-12-20 ENCOUNTER — Telehealth: Payer: Self-pay

## 2020-12-20 VITALS — BP 107/64 | HR 95 | Temp 97.3°F | Resp 18 | Wt 198.9 lb

## 2020-12-20 DIAGNOSIS — R634 Abnormal weight loss: Secondary | ICD-10-CM | POA: Diagnosis not present

## 2020-12-20 DIAGNOSIS — K5909 Other constipation: Secondary | ICD-10-CM

## 2020-12-20 DIAGNOSIS — G893 Neoplasm related pain (acute) (chronic): Secondary | ICD-10-CM | POA: Diagnosis not present

## 2020-12-20 DIAGNOSIS — C563 Malignant neoplasm of bilateral ovaries: Secondary | ICD-10-CM | POA: Diagnosis not present

## 2020-12-20 DIAGNOSIS — C787 Secondary malignant neoplasm of liver and intrahepatic bile duct: Secondary | ICD-10-CM | POA: Diagnosis not present

## 2020-12-20 DIAGNOSIS — Z9119 Patient's noncompliance with other medical treatment and regimen: Secondary | ICD-10-CM | POA: Diagnosis not present

## 2020-12-20 DIAGNOSIS — C786 Secondary malignant neoplasm of retroperitoneum and peritoneum: Secondary | ICD-10-CM | POA: Diagnosis not present

## 2020-12-20 DIAGNOSIS — C562 Malignant neoplasm of left ovary: Secondary | ICD-10-CM | POA: Diagnosis not present

## 2020-12-20 DIAGNOSIS — D649 Anemia, unspecified: Secondary | ICD-10-CM | POA: Diagnosis not present

## 2020-12-20 DIAGNOSIS — Z7189 Other specified counseling: Secondary | ICD-10-CM

## 2020-12-20 DIAGNOSIS — R948 Abnormal results of function studies of other organs and systems: Secondary | ICD-10-CM | POA: Diagnosis not present

## 2020-12-20 DIAGNOSIS — R748 Abnormal levels of other serum enzymes: Secondary | ICD-10-CM | POA: Diagnosis not present

## 2020-12-20 DIAGNOSIS — Z9221 Personal history of antineoplastic chemotherapy: Secondary | ICD-10-CM | POA: Diagnosis not present

## 2020-12-20 DIAGNOSIS — R109 Unspecified abdominal pain: Secondary | ICD-10-CM | POA: Diagnosis not present

## 2020-12-20 DIAGNOSIS — Z79899 Other long term (current) drug therapy: Secondary | ICD-10-CM | POA: Diagnosis not present

## 2020-12-20 DIAGNOSIS — R5381 Other malaise: Secondary | ICD-10-CM | POA: Diagnosis not present

## 2020-12-20 DIAGNOSIS — R1084 Generalized abdominal pain: Secondary | ICD-10-CM | POA: Diagnosis not present

## 2020-12-20 DIAGNOSIS — R978 Other abnormal tumor markers: Secondary | ICD-10-CM | POA: Diagnosis not present

## 2020-12-20 DIAGNOSIS — I700 Atherosclerosis of aorta: Secondary | ICD-10-CM | POA: Diagnosis not present

## 2020-12-20 MED ORDER — MORPHINE SULFATE ER 30 MG PO TBCR
30.0000 mg | EXTENDED_RELEASE_TABLET | Freq: Two times a day (BID) | ORAL | 0 refills | Status: AC
Start: 2020-12-20 — End: ?

## 2020-12-20 NOTE — Assessment & Plan Note (Signed)
She has chronic constipation We discussed the importance of aggressive laxative therapy

## 2020-12-20 NOTE — Telephone Encounter (Signed)
Called and given referral information/ below message. They will call [name] next week.

## 2020-12-20 NOTE — Telephone Encounter (Signed)
-----   Message from Heath Lark, MD sent at 12/20/2020  2:01 PM EST ----- Regarding: pls refer to [facility], non urgent to set up next week

## 2020-12-20 NOTE — Assessment & Plan Note (Addendum)
I reviewed results of her tumor marker and recent CT imaging Unfortunately, she has signs of significant disease progression Her abdominal disease is likely the cause of her abdominal pain The patient has progressed on multiple chemotherapy regimen and has poor baseline performance status due to her prior history of stroke We reviewed the guidelines There is limitation of what I can prescribe in the setting of diffuse liver disease and abnormal liver enzymes Her performance status has declined rapidly and she is dependent on caregivers for all activities of daily living Ultimately, after much discussion, she is in agreement to focus on palliative measures She is in agreement to stop chemotherapy and be referred for palliative care with hospice I will cancel her treatment and follow-up

## 2020-12-20 NOTE — Assessment & Plan Note (Signed)
She has poorly controlled pain I recommend adding MS Contin twice a day and she will continue oxycodone as needed I warned her about risk of constipation

## 2020-12-20 NOTE — Assessment & Plan Note (Signed)
The cause of her profound elevated liver enzymes are due to metastatic disease to the liver She is in pain I recommend we focus on comfort measures and not do treatment and she is in agreement

## 2020-12-20 NOTE — Assessment & Plan Note (Signed)
I have extensive discussion with patient and family about goals of care They are in agreement to stop treatment and be referred for palliative care and hospice We discussed CODE STATUS I recommend DO NOT RESUSCITATE and the patient is in agreement I gave her a DNR order

## 2020-12-20 NOTE — Progress Notes (Signed)
[place] OFFICE PROGRESS NOTE  Patient Care Team: Helane Rima, MD as PCP - General (Family Medicine) Lonia Mad, MD as Referring Physician (Neurology) Cameron Sprang, MD as Consulting Physician (Neurology)  ASSESSMENT & PLAN:  Ovarian cancer [facility]) I reviewed results of her tumor marker and recent CT imaging Unfortunately, she has signs of significant disease progression Her abdominal disease is likely the cause of her abdominal pain The patient has progressed on multiple chemotherapy regimen and has poor baseline performance status due to her prior history of stroke We reviewed the guidelines There is limitation of what I can prescribe in the setting of diffuse liver disease and abnormal liver enzymes Her performance status has declined rapidly and she is dependent on caregivers for all activities of daily living Ultimately, after much discussion, she is in agreement to focus on palliative measures She is in agreement to stop chemotherapy and be referred for palliative care with hospice I will cancel her treatment and follow-up  Elevated liver enzymes The cause of her profound elevated liver enzymes are due to metastatic disease to the liver She is in pain I recommend we focus on comfort measures and not do treatment and she is in agreement  Abdominal pain She has poorly controlled pain I recommend adding MS Contin twice a day and she will continue oxycodone as needed I warned her about risk of constipation  Other constipation She has chronic constipation We discussed the importance of aggressive laxative therapy  Goals of care, counseling/discussion I have extensive discussion with patient and family about goals of care They are in agreement to stop treatment and be referred for palliative care and hospice We discussed CODE STATUS I recommend DO NOT RESUSCITATE and the patient is in agreement I gave her a DNR order   No orders of the defined types  were placed in this encounter.   All questions were answered. The patient knows to call the clinic with any problems, questions or concerns. The total time spent in the appointment was 40 minutes encounter with patients including review of chart and various tests results, discussions about plan of care and coordination of care plan   Heath Lark, MD 12/20/2020 1:57 PM  INTERVAL HISTORY: Please see below for problem oriented charting. She returns with her daughter and husband for further follow-up She continues to have severe pain She is currently living with her daughter who is able to take care of her better She is dependent on caregiver for all activities of daily living She complained of shortness of breath on minimal exertion  SUMMARY OF ONCOLOGIC HISTORY: Oncology History Overview Note  Negative genetic testing Progressed on Niraparib, gemzar, carboplatin and topotecan   Carcinomatosis ([place])  12/06/2015 Initial Diagnosis   Carcinomatosis ([place])   07/18/2020 - 08/30/2020 Chemotherapy   The patient had carboplatin for chemotherapy treatment.     09/20/2020 - 11/22/2020 Chemotherapy   The patient had topotecan for chemotherapy treatment.     Ovarian cancer, bilateral  12/24/2015 Initial Diagnosis   Ovarian cancer, bilateral ([place])   07/18/2020 - 08/30/2020 Chemotherapy   The patient had carboplatin for chemotherapy treatment.     09/20/2020 - 11/22/2020 Chemotherapy   The patient had topotecan for chemotherapy treatment.     Ovarian cancer ([place])  11/28/2015 Imaging   Ct scan of abdomen: Peritoneal carcinomatosis and pelvic/inguinal adenopathy. Gynecologic primary is favored.   12/17/2015 Pathology Results   Lymph node, needle/core biopsy, L inguinal LAN - METASTATIC ADENOCARCINOMA. Microscopic Comment  Immunohistochemistry will be performed and reported as an addendum. (JDP:ecj 12/18/2015) ADDENDUM: Immunohistochemistry shows strong positivity with cytokeratin 7, estrogen  receptor (ER), progesterone receptor (PR), p53 and WT1. Negative markers are cytokeratin 20 , CDX- 2 and gross cystic disease fluid protein. 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 (JDP:kh 12-19-15   12/17/2015 Procedure   Technically successful ultrasound guided core left inguinal adenopathy biopsy   12/24/2015 Tumor Marker   Patient's tumor was tested for the following markers: CA-125 Results of the tumor marker test revealed 608.2   12/27/2015 - 02/28/2016 Chemotherapy   She received neoadjuvant chemo x 3 cycles   01/01/2016 Tumor Marker   Patient's tumor was tested for the following markers: CA-125 Results of the tumor marker test revealed 741.6   01/29/2016 Procedure   Successful placement of a right internal jugular approach power injectable Port-A-Cath. The catheter is ready for immediate use   01/29/2016 Imaging   US abdomen 1. Hyperechoic 2.5 x 1.0 x 1.6 cm lesion in the region the caudate lobe of the liver. Similar finding noted on prior CT of 11/28/2015. This could represent hemangioma. This could represent a malignancy including metastatic disease. 2. Exam otherwise unremarkable. No gallstones. No biliary distention.   02/03/2016 Tumor Marker   Patient's tumor was tested for the following markers: CA-125 Results of the tumor marker test revealed 137.1   02/20/2016 Imaging   MRI brain No acute infarct.  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  No intracranial mass.  Moderate small vessel disease changes.  Global atrophy without hydrocephalus.  Expanded partially empty sella without secondary findings of pseudotumor cerebri.  Minimal mucosal thickening ethmoid sinus air cells. Small air-fluid levels maxillary sinuses bilaterally may indicate changes of acute sinusitis.    02/28/2016 Tumor Marker   Patient's tumor was tested for the following  markers: CA-125 Results of the tumor marker test revealed 33.6   03/02/2016 Imaging   CT chest, abdomen and pelvis 1. Today's study demonstrates a positive response to therapy with resolution of the previously noted malignant ascites, significant regression of previously noted peritoneal implants, and regression of previously noted lymphadenopathy in the abdomen and pelvis. 2. No definite evidence to suggest metastatic disease to the thorax. 3. Stable 1.0 x 1.7 cm intermediate attenuation lesion associated with the posterior aspect of segment 1 of the liver, favored to represent a mildly proteinaceous hepatic cyst. The possibility of a peritoneal implant in this region is not entirely excluded, but is not favored on today's examination. 4. Extensive post infectious scarring inguinal upper right lung, likely sequela of prior necrotizing pneumonia. 5. Multiple tiny pulmonary nodules scattered throughout the lungs bilaterally all measuring 4 mm or less. These are nonspecific, but favored to be benign. Given the patient's history of primary gynecologic malignancy, attention on followup studies is recommended to ensure the stability of these findings. 6. Additional incidental findings, as above   03/14/2016 Imaging   CT angiogram 1. No pulmonary emboli. 2. Chronic changes to the right upper lobe. 3. Stable lesion in the caudate lobe of the liver. 4. Stable soft tissue prominence to the right of the trachea as described above. 5. Stable small nodule in the right lung.    03/24/2016 Pathology Results   1. Omentum, resection for tumor - FOCAL ADENOCARCINOMA ASSOCIATED WITH EXTENSIVE FIBROSIS, INFLAMMATION AND HEMOSIDERIN DEPOSITION. 2. Uterus +/- tubes/ovaries, neoplastic, cervix - CERVIX: SLIGHT CERVICITIS AND SQUAMOUS METAPLASIA. - ENDOMETRIUM: ATROPHIC,  NO HYPERPLASIA OR MALIGNANCY. - MYOMETRIUM: LEIOMYOMATA WITH DEGENERATIVE CHANGES. NO MALIGNANCY. - UTERINE SEROSA: FOCAL ADENOCARCINOMA  ASSOCIATED WITH ADHESIONS. - RIGHT OVARY: BENIGN CALCIFIED NODULE AND BENIGN SEROUS CYST. NO MALIGNANCY IDENTIFIED. - RIGHT FALLOPIAN TUBE: UNREMARKABLE. NO MALIGNANCY IDENTIFIED. - LEFT OVARY: FOCAL ADENOCARCINOMA ASSOCIATED WITH EXTENSIVE FIBROSIS. - LEFT FALLOPIAN TUBE: HYDROSALPINX. NO MALIGNANCY IDENTIFIED. 3. Lymph node, biopsy, right external iliac - METASTATIC ADENOCARCINOMA, 4. Soft tissue, biopsy, left para colic gutter - FOCAL ADENOCARCINOMA ASSOCIATED WITH FIBROSIS. Microscopic Comment 2. OVARY Specimen(s): Uterus with bilateral ovaries, omentum, right external iliac lymph node and left paracolic gutter. Procedure: (including lymph node sampling) Hysterectomy with bilateral salpingo-oophorectomy, omentectomy, lymph node biopsy and paracolic gutter biopsy. Primary tumor site (including laterality): Left ovary. Ovarian surface involvement: Yes Ovarian capsule intact without fragmentation: N/A Maximum tumor size (cm): 2 cm, 2 cm Histologic type: Serous carcinoma. Grade: 3 Peritoneal implants: (specify invasive or non-invasive): Left paracolic gutter positive for carcinoma Pelvic extension (list additional structures on separate lines and if involved): Omentum, right paracolic gutter and uterine serosa. Lymph nodes: number examined 1 ; number positive 1 TNM code: ypT3a, ypN1b, ypMX FIGO Stage (based on pathologic findings, needs clinical correlation): IIIA2 Comments: The left ovary has a 2 cm nodule, which is composed primarily of fibrous tissue with small foci of residual adenocarcinoma. There is also microscopic involvement of the uterine serosa, the left paracolic gutter biopsy and the omentum. The left paracolic gutter and omental involvement is microscopic and both are associated with extensive fibrosis with focal hemosiderin deposition. The right external iliac node is completely replaced by metastatic adenocarcinoma with serous features. (JDP:ecj 03/30/2016)   03/24/2016  Surgery   Operation: Robotic-assisted laparoscopic total hysterectomy with bilateral salpingoophorectomy, omentectomy, radical tumor debulking  Surgeon: Donaciano Eva  Operative Findings:  : small nodules in omentum, no ascites. Omental nodule (2cm) adherent to lower anterior abdominal wall. 2cm left colonic gutter cystic nodule. 6cm right external iliac lymph node. Grossly normal ovaries and tubes. Fibroid uterus. No gross residual disease at completion of surgery representing R0 optimal/complete resection.    04/17/2016 Imaging   CT abdomen and pelvis 1. There is no evidence of acute inflammatory process within abdomen or pelvis. 2. Stable low-density lesion within caudate lobe of the liver. No new hepatic lesions are noted. 3. Again noted lobulated renal contour and multifocal renal cortical scarring. No hydronephrosis or hydroureter. 4. No significant mesenteric adenopathy. No retroperitoneal adenopathy. Stable minimal residual peritoneal thickening within pelvis. No new pelvic implants or pelvic ascites. 5. Status post hysterectomy.  Unremarkable urinary bladder. 6. Moderate stool within colon as described above. No evidence of colitis.    04/24/2016 - 06/19/2016 Chemotherapy   She received 3 more cycles of chemo after surgery   05/14/2016 Tumor Marker   Patient's tumor was tested for the following markers: CA-125 Results of the tumor marker test revealed 22.5   06/04/2016 Tumor Marker   Patient's tumor was tested for the following markers: CA-125 Results of the tumor marker test revealed 13.7   07/07/2016 Genetic Testing   Patient has genetic testing done for breast/ovasrian cancer panel Results revealed patient has no actionable mutation   07/23/2016 Imaging   Ct abdomen and pelvis 1. Heterogeneously enhancing 4.9 x 3.2 x 4.4 cm mass along the right pelvic sidewall may represent locally recurrent disease or malignant lymphadenopathy. 2. No other signs of definite  metastatic disease noted elsewhere in the abdomen or pelvis. 3. Stable low-attenuation hepatic lesion in the caudate lobe of the  liver, which appears to demonstrates some progressive centripetal filling on delayed images. This lesion is presumably benign given its stability compared to prior studies, and is favored to represent a small cavernous hemangioma. 4. Cardiomegaly with biatrial dilatation, which is very severe on the right side. 5. Aortic atherosclerosis. 6. Additional incidental findings, as above.   07/23/2016 Tumor Marker   Patient's tumor was tested for the following markers: CA-125 Results of the tumor marker test revealed 12.3   09/30/2016 Imaging   Ct abdomen and pelvis: 1. 4.9 cm heterogeneously enhancing mass seen along the right pelvic sidewall previously has almost completely resolved. There is some ill-defined soft tissue attenuation/peritoneal thickening in this region today but no discrete measurable lesion is evident. 2. No new or progressive findings on today's exam. 3. Stable hypo attenuating lesion in the caudate lobe of the liver. 4. Subtle aortic atherosclerosis   09/30/2016 Tumor Marker   Patient's tumor was tested for the following markers: CA-125 Results of the tumor marker test revealed 9.6   10/05/2016 Pathology Results   Vagina, biopsy, left cuff - BENIGN FIBROEPITHELIAL (STROMAL) POLYP. - NO DYSPLASIA, ATYPIA OR MALIGNANCY IDENTIFIED.   01/14/2017 Tumor Marker   Patient's tumor was tested for the following markers: CA-125 Results of the tumor marker test revealed 11.9   05/05/2017 Tumor Marker   Patient's tumor was tested for the following markers: CA-125 Results of the tumor marker test revealed 28   06/14/2017 Tumor Marker   Patient's tumor was tested for the following markers: CA-125 Results of the tumor marker test revealed 45.7   06/24/2017 Imaging   Ct abdomen and pelvis: Increased peritoneal metastatic disease in abdomen and pelvis since  prior exam. No evidence of ascites. Stable small benign hepatic hemangioma.   07/21/2017 Tumor Marker   Patient's tumor was tested for the following markers: CA-125 Results of the tumor marker test revealed 84.1   07/21/2017 - 11/03/2017 Chemotherapy   She received carboplatin and taxol   08/11/2017 Adverse Reaction   She had severe neuropathy due to treatment. Does of chemo is reduced starting cycle 2 onwards   09/02/2017 Tumor Marker   Patient's tumor was tested for the following markers: CA-125 Results of the tumor marker test revealed 27.7   09/16/2017 Imaging   CT CHEST IMPRESSION  1. No acute process or evidence of metastatic disease in the chest. 2. Right upper lung bronchiectasis, consolidation, and architectural distortion are most likely post infectious or inflammatory and not significantly changed. 3. Aortic Atherosclerosis (ICD10-I70.0).  CT ABDOMEN AND PELVIS IMPRESSION  1. Response to therapy of nodal and peritoneal metastasis. 2. No new or progressive disease. 3. Caudate lobe hemangioma, as before. 4. Bilateral renal cortical thinning. Similar minimal right-sided caliectasis and hydroureter. Likely secondary to low-grade obstruction by the dominant right pelvic implant.   09/22/2017 Tumor Marker   Patient's tumor was tested for the following markers: CA-125 Results of the tumor marker test revealed 18   11/03/2017 Tumor Marker   Patient's tumor was tested for the following markers: CA-125 Results of the tumor marker test revealed 13.4   11/25/2017 Imaging   1. Stable to slight interval decrease in size of nodal and peritoneal metastatic disease within the abdomen. 2. No new or progressive disease.   12/03/2017 - 09/11/2019 Chemotherapy   The patient is prescribed [place]   01/17/2018 Tumor Marker   Patient's tumor was tested for the following markers: CA-125 Results of the tumor marker test revealed 11.9   02/22/2018 Tumor Marker  Patient's tumor was tested  for the following markers: CA-125 Results of the tumor marker test revealed 11.7   02/22/2018 Imaging   1. Improved nodular thickening along the vaginal cuff on the left and medial to the right iliac vessels, likely treated implants. No evidence of progressive peritoneal disease. 2. Stable hepatic hemangioma. No evidence of solid visceral organ metastases. 3. Bilateral renal cortical scarring.   05/19/2018 Tumor Marker   Patient's tumor was tested for the following markers: CA-125 Results of the tumor marker test revealed 9.9   08/10/2018 Imaging   Stable mild nodular thickening of the left vaginal cuff. No new or progressive disease within the abdomen or pelvis.  Stable small hepatic hemangioma.   09/21/2018 Tumor Marker   Patient's tumor was tested for the following markers: CA-125 Results of the tumor marker test revealed 10.3   11/11/2018 Tumor Marker   Patient's tumor was tested for the following markers: CA-125 Results of the tumor marker test revealed 10.5   12/15/2018 Tumor Marker   Patient's tumor was tested for the following markers: CA-125 Results of the tumor marker test revealed 12.6   01/06/2019 Tumor Marker   Patient's tumor was tested for the following markers: CA-125 Results of the tumor marker test revealed 14.6   01/12/2019 Imaging   1. Chronic extensive right upper lobe scarring changes and bronchiectasis. 2. No mediastinal or hilar mass or adenopathy and no findings for pulmonary metastatic disease. 3. Stable caudate lobe hemangioma. 4. No worrisome omental or peritoneal surface lesions. 5. Stable pericecal and small pelvic lymph nodes. No new or progressive findings.    09/11/2019 Imaging   Ct abdomen and pelvis Pericecal peritoneal metastatic implants   09/19/2019 - 07/04/2020 Chemotherapy   The patient had carboplatin and gemxar for chemotherapy treatment, then on gemzar maintenance since 11/28/2019    11/20/2019 Imaging   1. Mild decrease in size of  peritoneal tumor implant along the lateral aspect of the cecum. 2. Stable tiny sub-cm mesenteric soft tissue nodules or lymph nodes in right lower quadrant and anterior pelvic mesentery. 3. No new or progressive disease identified within the abdomen or pelvis.   03/12/2020 Imaging   1. No acute findings within the abdomen or pelvis. 2. Stable appearance of right lower quadrant peritoneal implant adjacent to cecum. 3. Increase in size of right lower quadrant ileocolic lymph node. 1.2 cm on today's study versus 0.8 previously. 4. Possible early avascular necrosis involving the left femoral head.   Aortic Atherosclerosis (ICD10-I70.0).   07/04/2020 Tumor Marker   Patient's tumor was tested for the following markers: CA-125 Results of the tumor marker test revealed 73   07/09/2020 Imaging   1. Enlarging noncalcified implants in the ileocolonic mesentery, suspicious for metastatic disease in this patient with a rising serum CA 125 level. There is a questionable new peritoneal implant associated with the distal small bowel. No resulting bowel obstruction. 2. No evidence of visceral metastatic disease. 3. Aortic Atherosclerosis (ICD10-I70.0).   07/18/2020 - 08/30/2020 Chemotherapy   The patient had carboplatin for chemotherapy treatment.     07/18/2020 Tumor Marker   Patient's tumor was tested for the following markers: CA-125 Results of the tumor marker test revealed 123   08/08/2020 Tumor Marker   Patient's tumor was tested for the following markers: CA-125 Results of the tumor marker test revealed 231   08/30/2020 Tumor Marker   Patient's tumor was tested for the following markers: CA-125 Results of the tumor marker test revealed 635   09/12/2020  Imaging   Mild increase in size of peritoneal metastasis in the right pelvis. Other small peritoneal nodules in right lower quadrant mesentery remain stable.    Stable soft tissue prominence involving the left vaginal cuff, suspicious for tumor.    No evidence of metastatic disease within the chest.     09/20/2020 - 11/22/2020 Chemotherapy   The patient had topotecan for chemotherapy treatment.     09/20/2020 Tumor Marker   Patient's tumor was tested for the following markers: CA-125 Results of the tumor marker test revealed 1288   10/11/2020 Tumor Marker   Patient's tumor was tested for the following markers: CA-125 Results of the tumor marker test revealed 1874   10/28/2020 Imaging   1. Redemonstrated soft tissue nodularity involving the terminal ileum and adjacent small bowel mesentery. 2. Unchanged soft tissue nodularity about the vaginal cuff status post hysterectomy. 3. No significant interval change in enlarged celiac axis and portacaval lymph nodes. 4. No evidence of new metastatic disease in the abdomen or pelvis. 5. New small right pleural effusion without definite evidence of metastatic involvement in the included portion of the lung bases, however this new effusion is highly suspicious for pulmonary or and/or pleural metastatic disease. Consider dedicated imaging of the chest.   11/08/2020 Tumor Marker   Patient's tumor was tested for the following markers: CA-125 Results of the tumor marker test revealed 8033   12/13/2020 Tumor Marker   Patient's tumor was tested for the following markers: CA-125. Results of the tumor marker test revealed 14989.   12/19/2020 Imaging   1. Interval development of mediastinal and right hilar lymphadenopathy, consistent with metastatic disease. 2. Innumerable new tiny hypodensities throughout both hepatic lobes range in size from several mm up to about 10 mm. Imaging features compatible with metastatic involvement. 3. Interval development of circumferential wall thickening in the cecum with tethering/retraction of the mesentery around the terminal ileum. Wall thickening in the terminal ileum is again noted with apparent luminal narrowing but no frank obstruction at this time. 4. Abnormal  soft tissue/lymphadenopathy in the ileocolic mesentery appears progressive. 5. new moderate to large volume pelvic ascites with peritoneal thickening and enhancement in the right pelvis on today's study. 6. Tiny right pleural effusion. 7. Similar appearance bilateral thyroid nodules. In the setting of significant comorbidities or limited life expectancy, no follow-up recommended (ref: J Am Coll Radiol. 2015 Feb;12(2): 143-50). 8. Aortic Atherosclerosis (ICD10-I70.0).       REVIEW OF SYSTEMS:   Constitutional: Denies fevers, chills or abnormal weight loss Eyes: Denies blurriness of vision Ears, nose, mouth, throat, and face: Denies mucositis or sore throat Respiratory: Denies cough, dyspnea or wheezes Cardiovascular: Denies palpitation, chest discomfort or lower extremity swelling Skin: Denies abnormal skin rashes Lymphatics: Denies new lymphadenopathy or easy bruising Neurological:Denies numbness, tingling or new weaknesses Behavioral/Psych: Mood is stable, no new changes  All other systems were reviewed with the patient and are negative.  I have reviewed the past medical history, past surgical history, social history and family history with the patient and they are unchanged from previous note.  ALLERGIES:  is allergic to codeine, lactulose, and latex.  MEDICATIONS:  Current Outpatient Medications  Medication Sig Dispense Refill  . morphine (MS CONTIN) 30 MG 12 hr tablet Take 1 tablet (30 mg total) by mouth every 12 (twelve) hours. 60 tablet 0  . apixaban (ELIQUIS) 5 MG TABS tablet Take 1 tablet (5 mg total) by mouth 2 (two) times daily. 60 tablet 5  . atorvastatin (LIPITOR)  40 MG tablet Take 40 mg by mouth at bedtime.    . gabapentin (NEURONTIN) 600 MG tablet TAKE 1 TABLET(600 MG) BY MOUTH TWICE DAILY 60 tablet 11  . lidocaine-prilocaine (EMLA) cream Apply to Porta-Cath 1-2 hours prior to access as directed. 30 g 11  . metoprolol succinate (TOPROL-XL) 25 MG 24 hr tablet Take 25 mg  by mouth daily.   5  . Multiple Vitamin (MULTIVITAMIN WITH MINERALS) TABS tablet Take 1 tablet by mouth daily.    Marland Kitchen omega-3 acid ethyl esters (LOVAZA) 1 g capsule Take 1 g by mouth daily.    . ondansetron (ZOFRAN ODT) 4 MG disintegrating tablet Take 1 tablet (4 mg total) by mouth every 8 (eight) hours as needed for nausea or vomiting. 60 tablet 9  . Oxycodone HCl 10 MG TABS Take 1 tablet (10 mg total) by mouth every 6 (six) hours as needed. 60 tablet 0  . PARoxetine (PAXIL) 40 MG tablet Take 40 mg by mouth daily.  0  . polyethylene glycol (MIRALAX / GLYCOLAX) packet Take 17 g by mouth daily.     No current facility-administered medications for this visit.    PHYSICAL EXAMINATION: ECOG PERFORMANCE STATUS: 3 - Symptomatic, >50% confined to bed  Vitals:   12/20/20 1328  BP: 107/64  Pulse: 95  Resp: 18  Temp: (!) 97.3 F (36.3 C)  SpO2: 100%   Filed Weights   12/20/20 1328  Weight: 198 lb 14.4 oz (90.2 kg)    GENERAL:alert, no distress and comfortable NEURO: alert & oriented  LABORATORY DATA:  I have reviewed the data as listed    Component Value Date/Time   NA 136 12/13/2020 1039   NA 142 12/08/2017 1051   K 3.7 12/13/2020 1039   K 4.4 12/08/2017 1051   CL 103 12/13/2020 1039   CL 105 06/14/2017 1430   CO2 24 12/13/2020 1039   CO2 28 12/08/2017 1051   GLUCOSE 122 (H) 12/13/2020 1039   GLUCOSE 97 12/08/2017 1051   BUN 11 12/13/2020 1039   BUN 16.2 12/08/2017 1051   CREATININE 1.05 (H) 12/13/2020 1039   CREATININE 0.72 07/18/2020 1141   CREATININE 0.9 12/08/2017 1051   CALCIUM 9.4 12/13/2020 1039   CALCIUM 9.5 12/08/2017 1051   PROT 7.7 12/13/2020 1039   PROT 6.9 12/08/2017 1051   ALBUMIN 2.6 (L) 12/13/2020 1039   ALBUMIN 3.6 12/08/2017 1051   AST 110 (H) 12/13/2020 1039   AST 26 07/18/2020 1141   AST 18 12/08/2017 1051   ALT 47 (H) 12/13/2020 1039   ALT 25 07/18/2020 1141   ALT 20 12/08/2017 1051   ALKPHOS 1,600 (H) 12/13/2020 1039   ALKPHOS 131 12/08/2017  1051   BILITOT 1.6 (H) 12/13/2020 1039   BILITOT 0.5 07/18/2020 1141   BILITOT 0.70 12/08/2017 1051   GFRNONAA 57 (L) 12/13/2020 1039   GFRNONAA >60 07/18/2020 1141   GFRAA >60 08/30/2020 1105   GFRAA >60 07/18/2020 1141    No results found for: SPEP, UPEP  Lab Results  Component Value Date   WBC 7.3 12/13/2020   NEUTROABS 4.4 12/13/2020   HGB 11.7 (L) 12/13/2020   HCT 35.5 (L) 12/13/2020   MCV 102.3 (H) 12/13/2020   PLT 360 12/13/2020      Chemistry      Component Value Date/Time   NA 136 12/13/2020 1039   NA 142 12/08/2017 1051   K 3.7 12/13/2020 1039   K 4.4 12/08/2017 1051   CL 103 12/13/2020 1039  CL 105 06/14/2017 1430   CO2 24 12/13/2020 1039   CO2 28 12/08/2017 1051   BUN 11 12/13/2020 1039   BUN 16.2 12/08/2017 1051   CREATININE 1.05 (H) 12/13/2020 1039   CREATININE 0.72 07/18/2020 1141   CREATININE 0.9 12/08/2017 1051      Component Value Date/Time   CALCIUM 9.4 12/13/2020 1039   CALCIUM 9.5 12/08/2017 1051   ALKPHOS 1,600 (H) 12/13/2020 1039   ALKPHOS 131 12/08/2017 1051   AST 110 (H) 12/13/2020 1039   AST 26 07/18/2020 1141   AST 18 12/08/2017 1051   ALT 47 (H) 12/13/2020 1039   ALT 25 07/18/2020 1141   ALT 20 12/08/2017 1051   BILITOT 1.6 (H) 12/13/2020 1039   BILITOT 0.5 07/18/2020 1141   BILITOT 0.70 12/08/2017 1051       RADIOGRAPHIC STUDIES: I have reviewed multiple imaging studies with the patient and her daughter I have personally reviewed the radiological images as listed and agreed with the findings in the report. CT CHEST ABDOMEN PELVIS W CONTRAST  Result Date: 12/19/2020 CLINICAL DATA:  Ovarian cancer.  Restaging. EXAM: CT CHEST, ABDOMEN, AND PELVIS WITH CONTRAST TECHNIQUE: Multidetector CT imaging of the chest, abdomen and pelvis was performed following the standard protocol during bolus administration of intravenous contrast. CONTRAST:  172mL OMNIPAQUE IOHEXOL 300 MG/ML  SOLN COMPARISON:  Abdomen/pelvis CT 10/28/2020. Chest  abdomen pelvis CT 09/12/2020. FINDINGS: CT CHEST FINDINGS Cardiovascular: The heart size is normal. No substantial pericardial effusion. Right Port-A-Cath tip is positioned in the upper right atrium. Mediastinum/Nodes: 13 mm short axis necrotic subcarinal lymph node is new since chest CT 09/12/2020. New mild lymphadenopathy noted right hilum. 12 mm short axis lymph node in the right thoracic inlet (16/2) is new in the interval. Similar appearance bilateral thyroid nodules measuring up to 18 mm on the left. There is no axillary lymphadenopathy. Lungs/Pleura: Stable scarring in the right lung apex. Tiny right pleural effusion is new in the interval. Left lung clear. Musculoskeletal: No worrisome lytic or sclerotic osseous abnormality. CT ABDOMEN PELVIS FINDINGS Hepatobiliary: Innumerable new tiny hypodensities are seen throughout both hepatic lobes measuring up to 11 mm in the right liver. Gallbladder is nondistended. No intrahepatic or extrahepatic biliary dilation. Pancreas: No focal mass lesion. No dilatation of the main duct. No intraparenchymal cyst. No peripancreatic edema. Spleen: No splenomegaly. No focal mass lesion. Adrenals/Urinary Tract: No adrenal nodule or mass. Cortical scarring noted both kidneys without hydronephrosis or suspicious enhancing renal mass lesion. No evidence for hydroureter. The urinary bladder appears normal for the degree of distention. Stomach/Bowel: Stomach is distended with fluid and contrast material. Duodenum is normally positioned as is the ligament of Treitz. No small bowel wall thickening. No small bowel dilatation. Soft tissue associated with the terminal ileum is progressive in the interval generating apparent luminal narrowing without overt obstructive appearance at this time (see axial 100/2 and coronal 65/5).Interval development of circumferential wall thickening in the cecum. Appendix is probably visible on axial 96/2 and is diffusely fluid-filled but not distended.  Vascular/Lymphatic: There is abdominal aortic atherosclerosis without aneurysm. 2 cm low-density lesion in the aortocaval space, potentially along the capsule the caudate lobe is new in the interval. 8 mm short axis retrocaval node measured previously is similar today. 9 mm node anterior to the IVC on 80/2 is stable. No pelvic sidewall lymphadenopathy. Reproductive: Uterus surgically absent.  There is no adnexal mass. Other: Lymphadenopathy and abnormal soft tissue in the ileocolic mesentery is progressive. There is tethering/retraction of  mesentery around the terminal ileum. Ileocolic lymph nodes measure up to 13 mm short axis, progressive in the interval. There is new perihepatic fluid with new moderate to large volume fluid in the pelvis today. Nodularity along the right vaginal cuff is associated with peritoneal thickening and enhancement (see image 110/2). Musculoskeletal: No worrisome lytic or sclerotic osseous abnormality. IMPRESSION: 1. Interval development of mediastinal and right hilar lymphadenopathy, consistent with metastatic disease. 2. Innumerable new tiny hypodensities throughout both hepatic lobes range in size from several mm up to about 10 mm. Imaging features compatible with metastatic involvement. 3. Interval development of circumferential wall thickening in the cecum with tethering/retraction of the mesentery around the terminal ileum. Wall thickening in the terminal ileum is again noted with apparent luminal narrowing but no frank obstruction at this time. 4. Abnormal soft tissue/lymphadenopathy in the ileocolic mesentery appears progressive. 5. new moderate to large volume pelvic ascites with peritoneal thickening and enhancement in the right pelvis on today's study. 6. Tiny right pleural effusion. 7. Similar appearance bilateral thyroid nodules. In the setting of significant comorbidities or limited life expectancy, no follow-up recommended (ref: J Am Coll Radiol. 2015 Feb;12(2): 143-50). 8.  Aortic Atherosclerosis (ICD10-I70.0). Electronically Signed   By: Misty Stanley M.D.   On: 12/19/2020 16:00

## 2020-12-27 ENCOUNTER — Inpatient Hospital Stay: Payer: Medicare Other

## 2021-02-04 DEATH — deceased
